# Patient Record
Sex: Female | Born: 1965 | Race: White | Hispanic: No | Smoking: Former smoker
Health system: Southern US, Community
[De-identification: ages and names within clinical notes are randomized; demographics above are authoritative.]

## PROBLEM LIST (undated history)

## (undated) DIAGNOSIS — F32A Depression, unspecified: Secondary | ICD-10-CM

## (undated) DIAGNOSIS — M14672 Charcot's joint, left ankle and foot: Secondary | ICD-10-CM

## (undated) DIAGNOSIS — E785 Hyperlipidemia, unspecified: Secondary | ICD-10-CM

## (undated) DIAGNOSIS — G709 Myoneural disorder, unspecified: Secondary | ICD-10-CM

## (undated) DIAGNOSIS — D649 Anemia, unspecified: Secondary | ICD-10-CM

## (undated) DIAGNOSIS — F329 Major depressive disorder, single episode, unspecified: Secondary | ICD-10-CM

## (undated) DIAGNOSIS — F250 Schizoaffective disorder, bipolar type: Secondary | ICD-10-CM

## (undated) DIAGNOSIS — F259 Schizoaffective disorder, unspecified: Secondary | ICD-10-CM

## (undated) DIAGNOSIS — E119 Type 2 diabetes mellitus without complications: Secondary | ICD-10-CM

## (undated) DIAGNOSIS — I10 Essential (primary) hypertension: Secondary | ICD-10-CM

## (undated) HISTORY — PX: CHOLECYSTECTOMY: SHX55

## (undated) HISTORY — DX: Hyperlipidemia, unspecified: E78.5

## (undated) HISTORY — DX: Anemia, unspecified: D64.9

## (undated) HISTORY — DX: Myoneural disorder, unspecified: G70.9

## (undated) SURGERY — OSTECTOMY
Anesthesia: IV Sedation (MBSC Only) | Laterality: Right

---

## 2008-10-01 ENCOUNTER — Emergency Department: Payer: Self-pay | Admitting: Emergency Medicine

## 2008-10-03 ENCOUNTER — Emergency Department: Payer: Self-pay | Admitting: Emergency Medicine

## 2009-07-07 ENCOUNTER — Inpatient Hospital Stay: Payer: Self-pay | Admitting: Internal Medicine

## 2011-05-09 ENCOUNTER — Emergency Department: Payer: Self-pay | Admitting: *Deleted

## 2012-06-26 ENCOUNTER — Emergency Department: Payer: Self-pay | Admitting: Emergency Medicine

## 2012-06-28 ENCOUNTER — Emergency Department: Payer: Self-pay | Admitting: Internal Medicine

## 2012-06-30 LAB — WOUND CULTURE

## 2012-08-19 ENCOUNTER — Emergency Department: Payer: Self-pay | Admitting: Emergency Medicine

## 2012-08-19 DIAGNOSIS — Z87891 Personal history of nicotine dependence: Secondary | ICD-10-CM | POA: Diagnosis not present

## 2012-08-19 DIAGNOSIS — R110 Nausea: Secondary | ICD-10-CM | POA: Diagnosis not present

## 2012-08-19 DIAGNOSIS — I498 Other specified cardiac arrhythmias: Secondary | ICD-10-CM | POA: Diagnosis not present

## 2012-08-19 DIAGNOSIS — IMO0001 Reserved for inherently not codable concepts without codable children: Secondary | ICD-10-CM | POA: Diagnosis not present

## 2012-08-19 DIAGNOSIS — E119 Type 2 diabetes mellitus without complications: Secondary | ICD-10-CM | POA: Diagnosis not present

## 2012-08-19 DIAGNOSIS — Z9089 Acquired absence of other organs: Secondary | ICD-10-CM | POA: Diagnosis not present

## 2012-08-19 DIAGNOSIS — R109 Unspecified abdominal pain: Secondary | ICD-10-CM | POA: Diagnosis not present

## 2012-08-19 LAB — CBC
HCT: 45.7 % (ref 35.0–47.0)
HGB: 15.2 g/dL (ref 12.0–16.0)
MCH: 29.5 pg (ref 26.0–34.0)
MCHC: 33.2 g/dL (ref 32.0–36.0)
MCV: 89 fL (ref 80–100)
Platelet: 203 10*3/uL (ref 150–440)
RBC: 5.14 10*6/uL (ref 3.80–5.20)
RDW: 16.4 % — ABNORMAL HIGH (ref 11.5–14.5)
WBC: 7.7 10*3/uL (ref 3.6–11.0)

## 2012-08-19 LAB — COMPREHENSIVE METABOLIC PANEL
Albumin: 3.6 g/dL (ref 3.4–5.0)
Alkaline Phosphatase: 113 U/L (ref 50–136)
Anion Gap: 11 (ref 7–16)
BUN: 15 mg/dL (ref 7–18)
Bilirubin,Total: 0.5 mg/dL (ref 0.2–1.0)
Calcium, Total: 8.9 mg/dL (ref 8.5–10.1)
Chloride: 99 mmol/L (ref 98–107)
Co2: 23 mmol/L (ref 21–32)
Creatinine: 0.86 mg/dL (ref 0.60–1.30)
EGFR (African American): >60
EGFR (Non-African Amer.): >60
Glucose: 331 mg/dL — ABNORMAL HIGH (ref 65–99)
Osmolality: 280 (ref 275–301)
Potassium: 3.1 mmol/L — ABNORMAL LOW (ref 3.5–5.1)
SGOT(AST): 30 U/L (ref 15–37)
SGPT (ALT): 45 U/L (ref 12–78)
Sodium: 133 mmol/L — ABNORMAL LOW (ref 136–145)
Total Protein: 7.8 g/dL (ref 6.4–8.2)

## 2012-08-19 LAB — URINALYSIS, COMPLETE
Bacteria: NONE SEEN
Bilirubin,UR: NEGATIVE
Glucose,UR: >=500 mg/dL (ref 0–75)
Nitrite: NEGATIVE
Ph: 5 (ref 4.5–8.0)
Protein: NEGATIVE
RBC,UR: 1 /HPF (ref 0–5)
Specific Gravity: 1.015 (ref 1.003–1.030)
Squamous Epithelial: 3
WBC UR: 1 /HPF (ref 0–5)

## 2012-08-19 LAB — TROPONIN I
Troponin-I: <0.02 ng/mL
Troponin-I: <0.02 ng/mL

## 2012-08-19 LAB — LIPASE, BLOOD: Lipase: 97 U/L (ref 73–393)

## 2012-10-10 DIAGNOSIS — F209 Schizophrenia, unspecified: Secondary | ICD-10-CM | POA: Diagnosis not present

## 2012-10-10 DIAGNOSIS — I10 Essential (primary) hypertension: Secondary | ICD-10-CM | POA: Diagnosis not present

## 2012-10-10 DIAGNOSIS — E119 Type 2 diabetes mellitus without complications: Secondary | ICD-10-CM | POA: Diagnosis not present

## 2012-10-10 DIAGNOSIS — E785 Hyperlipidemia, unspecified: Secondary | ICD-10-CM | POA: Diagnosis not present

## 2012-12-10 DIAGNOSIS — F259 Schizoaffective disorder, unspecified: Secondary | ICD-10-CM | POA: Diagnosis not present

## 2013-03-13 DIAGNOSIS — F259 Schizoaffective disorder, unspecified: Secondary | ICD-10-CM | POA: Diagnosis not present

## 2013-03-26 DIAGNOSIS — F259 Schizoaffective disorder, unspecified: Secondary | ICD-10-CM | POA: Diagnosis not present

## 2013-04-15 DIAGNOSIS — E1142 Type 2 diabetes mellitus with diabetic polyneuropathy: Secondary | ICD-10-CM | POA: Diagnosis not present

## 2013-04-15 DIAGNOSIS — G609 Hereditary and idiopathic neuropathy, unspecified: Secondary | ICD-10-CM | POA: Diagnosis not present

## 2013-04-15 DIAGNOSIS — I10 Essential (primary) hypertension: Secondary | ICD-10-CM | POA: Diagnosis not present

## 2013-04-15 DIAGNOSIS — E1149 Type 2 diabetes mellitus with other diabetic neurological complication: Secondary | ICD-10-CM | POA: Diagnosis not present

## 2013-04-15 DIAGNOSIS — N760 Acute vaginitis: Secondary | ICD-10-CM | POA: Diagnosis not present

## 2013-04-16 DIAGNOSIS — E1149 Type 2 diabetes mellitus with other diabetic neurological complication: Secondary | ICD-10-CM | POA: Diagnosis not present

## 2013-04-16 DIAGNOSIS — I10 Essential (primary) hypertension: Secondary | ICD-10-CM | POA: Diagnosis not present

## 2013-04-17 ENCOUNTER — Ambulatory Visit: Payer: Self-pay | Admitting: Family Medicine

## 2013-04-17 DIAGNOSIS — Z794 Long term (current) use of insulin: Secondary | ICD-10-CM | POA: Diagnosis not present

## 2013-04-17 DIAGNOSIS — E669 Obesity, unspecified: Secondary | ICD-10-CM | POA: Diagnosis not present

## 2013-04-17 DIAGNOSIS — Z7189 Other specified counseling: Secondary | ICD-10-CM | POA: Diagnosis not present

## 2013-04-17 DIAGNOSIS — E119 Type 2 diabetes mellitus without complications: Secondary | ICD-10-CM | POA: Diagnosis not present

## 2013-05-02 ENCOUNTER — Ambulatory Visit: Payer: Self-pay | Admitting: Family Medicine

## 2013-05-16 DIAGNOSIS — E785 Hyperlipidemia, unspecified: Secondary | ICD-10-CM | POA: Diagnosis not present

## 2013-05-16 DIAGNOSIS — F209 Schizophrenia, unspecified: Secondary | ICD-10-CM | POA: Diagnosis not present

## 2013-05-16 DIAGNOSIS — I10 Essential (primary) hypertension: Secondary | ICD-10-CM | POA: Diagnosis not present

## 2013-05-16 DIAGNOSIS — E119 Type 2 diabetes mellitus without complications: Secondary | ICD-10-CM | POA: Diagnosis not present

## 2013-06-05 DIAGNOSIS — F259 Schizoaffective disorder, unspecified: Secondary | ICD-10-CM | POA: Diagnosis not present

## 2013-07-02 DIAGNOSIS — F259 Schizoaffective disorder, unspecified: Secondary | ICD-10-CM | POA: Diagnosis not present

## 2013-07-31 DIAGNOSIS — F259 Schizoaffective disorder, unspecified: Secondary | ICD-10-CM | POA: Diagnosis not present

## 2013-08-15 DIAGNOSIS — E119 Type 2 diabetes mellitus without complications: Secondary | ICD-10-CM | POA: Diagnosis not present

## 2013-08-15 DIAGNOSIS — E785 Hyperlipidemia, unspecified: Secondary | ICD-10-CM | POA: Diagnosis not present

## 2013-08-15 DIAGNOSIS — I10 Essential (primary) hypertension: Secondary | ICD-10-CM | POA: Diagnosis not present

## 2013-08-15 DIAGNOSIS — F209 Schizophrenia, unspecified: Secondary | ICD-10-CM | POA: Diagnosis not present

## 2013-08-20 DIAGNOSIS — E785 Hyperlipidemia, unspecified: Secondary | ICD-10-CM | POA: Diagnosis not present

## 2013-09-05 ENCOUNTER — Ambulatory Visit: Payer: Self-pay | Admitting: Family Medicine

## 2014-01-13 DIAGNOSIS — L02519 Cutaneous abscess of unspecified hand: Secondary | ICD-10-CM | POA: Diagnosis not present

## 2014-01-15 DIAGNOSIS — L02519 Cutaneous abscess of unspecified hand: Secondary | ICD-10-CM | POA: Diagnosis not present

## 2014-01-16 DIAGNOSIS — E119 Type 2 diabetes mellitus without complications: Secondary | ICD-10-CM | POA: Diagnosis not present

## 2014-01-16 DIAGNOSIS — L089 Local infection of the skin and subcutaneous tissue, unspecified: Secondary | ICD-10-CM | POA: Diagnosis not present

## 2014-02-13 ENCOUNTER — Emergency Department: Payer: Self-pay | Admitting: Internal Medicine

## 2014-02-13 DIAGNOSIS — Z79899 Other long term (current) drug therapy: Secondary | ICD-10-CM | POA: Diagnosis not present

## 2014-02-13 DIAGNOSIS — Z794 Long term (current) use of insulin: Secondary | ICD-10-CM | POA: Diagnosis not present

## 2014-02-13 DIAGNOSIS — Z760 Encounter for issue of repeat prescription: Secondary | ICD-10-CM | POA: Diagnosis not present

## 2014-02-13 DIAGNOSIS — I10 Essential (primary) hypertension: Secondary | ICD-10-CM | POA: Diagnosis not present

## 2014-02-13 DIAGNOSIS — E119 Type 2 diabetes mellitus without complications: Secondary | ICD-10-CM | POA: Diagnosis not present

## 2014-03-20 DIAGNOSIS — E119 Type 2 diabetes mellitus without complications: Secondary | ICD-10-CM | POA: Diagnosis not present

## 2014-03-25 DIAGNOSIS — E785 Hyperlipidemia, unspecified: Secondary | ICD-10-CM | POA: Diagnosis not present

## 2014-03-25 DIAGNOSIS — F209 Schizophrenia, unspecified: Secondary | ICD-10-CM | POA: Diagnosis not present

## 2014-03-25 DIAGNOSIS — E119 Type 2 diabetes mellitus without complications: Secondary | ICD-10-CM | POA: Diagnosis not present

## 2014-03-25 DIAGNOSIS — R600 Localized edema: Secondary | ICD-10-CM | POA: Diagnosis not present

## 2014-03-25 DIAGNOSIS — I10 Essential (primary) hypertension: Secondary | ICD-10-CM | POA: Diagnosis not present

## 2014-04-02 DIAGNOSIS — E1140 Type 2 diabetes mellitus with diabetic neuropathy, unspecified: Secondary | ICD-10-CM | POA: Diagnosis not present

## 2014-04-02 DIAGNOSIS — G629 Polyneuropathy, unspecified: Secondary | ICD-10-CM | POA: Insufficient documentation

## 2014-04-02 DIAGNOSIS — Z794 Long term (current) use of insulin: Secondary | ICD-10-CM | POA: Diagnosis not present

## 2014-04-02 DIAGNOSIS — E782 Mixed hyperlipidemia: Secondary | ICD-10-CM | POA: Diagnosis not present

## 2014-04-07 DIAGNOSIS — B351 Tinea unguium: Secondary | ICD-10-CM | POA: Diagnosis not present

## 2014-04-07 DIAGNOSIS — R600 Localized edema: Secondary | ICD-10-CM | POA: Diagnosis not present

## 2014-04-07 DIAGNOSIS — E1140 Type 2 diabetes mellitus with diabetic neuropathy, unspecified: Secondary | ICD-10-CM | POA: Diagnosis not present

## 2014-04-10 ENCOUNTER — Ambulatory Visit: Payer: Self-pay | Admitting: Family Medicine

## 2014-04-10 DIAGNOSIS — E119 Type 2 diabetes mellitus without complications: Secondary | ICD-10-CM | POA: Diagnosis not present

## 2014-04-10 DIAGNOSIS — Z7189 Other specified counseling: Secondary | ICD-10-CM | POA: Diagnosis not present

## 2014-04-15 ENCOUNTER — Emergency Department: Payer: Self-pay | Admitting: Emergency Medicine

## 2014-04-15 DIAGNOSIS — I10 Essential (primary) hypertension: Secondary | ICD-10-CM | POA: Diagnosis not present

## 2014-04-15 DIAGNOSIS — F209 Schizophrenia, unspecified: Secondary | ICD-10-CM | POA: Diagnosis not present

## 2014-04-15 DIAGNOSIS — Z760 Encounter for issue of repeat prescription: Secondary | ICD-10-CM | POA: Diagnosis not present

## 2014-04-15 DIAGNOSIS — E119 Type 2 diabetes mellitus without complications: Secondary | ICD-10-CM | POA: Diagnosis not present

## 2014-04-15 DIAGNOSIS — Z87891 Personal history of nicotine dependence: Secondary | ICD-10-CM | POA: Diagnosis not present

## 2014-04-16 DIAGNOSIS — F29 Unspecified psychosis not due to a substance or known physiological condition: Secondary | ICD-10-CM | POA: Diagnosis not present

## 2014-04-28 DIAGNOSIS — F251 Schizoaffective disorder, depressive type: Secondary | ICD-10-CM | POA: Diagnosis not present

## 2014-05-02 ENCOUNTER — Ambulatory Visit: Payer: Self-pay | Admitting: Family Medicine

## 2014-05-16 DIAGNOSIS — R0989 Other specified symptoms and signs involving the circulatory and respiratory systems: Secondary | ICD-10-CM | POA: Diagnosis not present

## 2014-05-16 DIAGNOSIS — E1165 Type 2 diabetes mellitus with hyperglycemia: Secondary | ICD-10-CM | POA: Diagnosis not present

## 2014-10-01 DIAGNOSIS — E1149 Type 2 diabetes mellitus with other diabetic neurological complication: Secondary | ICD-10-CM | POA: Diagnosis not present

## 2014-10-01 DIAGNOSIS — G629 Polyneuropathy, unspecified: Secondary | ICD-10-CM | POA: Diagnosis not present

## 2014-10-07 DIAGNOSIS — E1149 Type 2 diabetes mellitus with other diabetic neurological complication: Secondary | ICD-10-CM | POA: Diagnosis not present

## 2014-10-07 DIAGNOSIS — E11621 Type 2 diabetes mellitus with foot ulcer: Secondary | ICD-10-CM | POA: Diagnosis not present

## 2014-10-07 DIAGNOSIS — L97511 Non-pressure chronic ulcer of other part of right foot limited to breakdown of skin: Secondary | ICD-10-CM | POA: Diagnosis not present

## 2014-10-07 DIAGNOSIS — M79671 Pain in right foot: Secondary | ICD-10-CM | POA: Diagnosis not present

## 2014-10-07 DIAGNOSIS — Z794 Long term (current) use of insulin: Secondary | ICD-10-CM | POA: Diagnosis not present

## 2014-10-07 DIAGNOSIS — G629 Polyneuropathy, unspecified: Secondary | ICD-10-CM | POA: Diagnosis not present

## 2014-10-21 ENCOUNTER — Other Ambulatory Visit: Payer: Self-pay | Admitting: Family Medicine

## 2014-10-21 DIAGNOSIS — E1142 Type 2 diabetes mellitus with diabetic polyneuropathy: Secondary | ICD-10-CM

## 2014-10-21 MED ORDER — GLUCOSE BLOOD VI STRP
ORAL_STRIP | Status: DC
Start: 2014-10-21 — End: 2015-06-24

## 2014-10-23 DIAGNOSIS — L97511 Non-pressure chronic ulcer of other part of right foot limited to breakdown of skin: Secondary | ICD-10-CM | POA: Diagnosis not present

## 2014-10-23 DIAGNOSIS — E1149 Type 2 diabetes mellitus with other diabetic neurological complication: Secondary | ICD-10-CM | POA: Diagnosis not present

## 2014-11-04 ENCOUNTER — Other Ambulatory Visit: Payer: Self-pay | Admitting: Family Medicine

## 2014-11-04 DIAGNOSIS — IMO0002 Reserved for concepts with insufficient information to code with codable children: Secondary | ICD-10-CM | POA: Insufficient documentation

## 2014-11-04 DIAGNOSIS — E1165 Type 2 diabetes mellitus with hyperglycemia: Secondary | ICD-10-CM

## 2014-11-04 DIAGNOSIS — E1149 Type 2 diabetes mellitus with other diabetic neurological complication: Secondary | ICD-10-CM | POA: Insufficient documentation

## 2014-11-04 MED ORDER — ONETOUCH ULTRASOFT LANCETS MISC: Status: DC

## 2014-11-13 DIAGNOSIS — L97511 Non-pressure chronic ulcer of other part of right foot limited to breakdown of skin: Secondary | ICD-10-CM | POA: Diagnosis not present

## 2014-11-13 DIAGNOSIS — E1140 Type 2 diabetes mellitus with diabetic neuropathy, unspecified: Secondary | ICD-10-CM | POA: Diagnosis not present

## 2014-11-20 DIAGNOSIS — E1142 Type 2 diabetes mellitus with diabetic polyneuropathy: Secondary | ICD-10-CM | POA: Diagnosis not present

## 2014-11-20 DIAGNOSIS — E1165 Type 2 diabetes mellitus with hyperglycemia: Secondary | ICD-10-CM | POA: Diagnosis not present

## 2014-11-20 DIAGNOSIS — E1149 Type 2 diabetes mellitus with other diabetic neurological complication: Secondary | ICD-10-CM | POA: Diagnosis not present

## 2014-11-20 DIAGNOSIS — E11621 Type 2 diabetes mellitus with foot ulcer: Secondary | ICD-10-CM | POA: Diagnosis not present

## 2014-11-24 ENCOUNTER — Encounter: Payer: Self-pay | Admitting: Emergency Medicine

## 2014-11-24 ENCOUNTER — Emergency Department
Admission: EM | Admit: 2014-11-24 | Discharge: 2014-11-24 | Disposition: A | Discharge status: Home / Self Care | Payer: Medicare Other | Attending: Emergency Medicine | Admitting: Emergency Medicine

## 2014-11-24 DIAGNOSIS — S99921A Unspecified injury of right foot, initial encounter: Secondary | ICD-10-CM | POA: Diagnosis present

## 2014-11-24 DIAGNOSIS — L03031 Cellulitis of right toe: Secondary | ICD-10-CM | POA: Diagnosis not present

## 2014-11-24 DIAGNOSIS — Y9289 Other specified places as the place of occurrence of the external cause: Secondary | ICD-10-CM | POA: Insufficient documentation

## 2014-11-24 DIAGNOSIS — Y998 Other external cause status: Secondary | ICD-10-CM | POA: Diagnosis not present

## 2014-11-24 DIAGNOSIS — S91204A Unspecified open wound of right lesser toe(s) with damage to nail, initial encounter: Secondary | ICD-10-CM | POA: Insufficient documentation

## 2014-11-24 DIAGNOSIS — Z79899 Other long term (current) drug therapy: Secondary | ICD-10-CM | POA: Diagnosis not present

## 2014-11-24 DIAGNOSIS — S91209A Unspecified open wound of unspecified toe(s) with damage to nail, initial encounter: Secondary | ICD-10-CM

## 2014-11-24 DIAGNOSIS — S96091A Other injury of muscle and tendon of long flexor muscle of toe at ankle and foot level, right foot, initial encounter: Secondary | ICD-10-CM | POA: Diagnosis not present

## 2014-11-24 DIAGNOSIS — E119 Type 2 diabetes mellitus without complications: Secondary | ICD-10-CM | POA: Insufficient documentation

## 2014-11-24 DIAGNOSIS — Z87891 Personal history of nicotine dependence: Secondary | ICD-10-CM | POA: Diagnosis not present

## 2014-11-24 DIAGNOSIS — X58XXXA Exposure to other specified factors, initial encounter: Secondary | ICD-10-CM | POA: Insufficient documentation

## 2014-11-24 DIAGNOSIS — I10 Essential (primary) hypertension: Secondary | ICD-10-CM | POA: Diagnosis not present

## 2014-11-24 DIAGNOSIS — Y9389 Activity, other specified: Secondary | ICD-10-CM | POA: Insufficient documentation

## 2014-11-24 DIAGNOSIS — L03115 Cellulitis of right lower limb: Secondary | ICD-10-CM | POA: Diagnosis not present

## 2014-11-24 HISTORY — DX: Type 2 diabetes mellitus without complications: E11.9

## 2014-11-24 HISTORY — DX: Major depressive disorder, single episode, unspecified: F32.9

## 2014-11-24 HISTORY — DX: Schizoaffective disorder, bipolar type: F25.0

## 2014-11-24 HISTORY — DX: Schizoaffective disorder, unspecified: F25.9

## 2014-11-24 HISTORY — DX: Depression, unspecified: F32.A

## 2014-11-24 HISTORY — DX: Essential (primary) hypertension: I10

## 2014-11-24 LAB — GLUCOSE, CAPILLARY: Glucose-Capillary: 190 mg/dL — ABNORMAL HIGH (ref 65–99)

## 2014-11-24 MED ORDER — SULFAMETHOXAZOLE-TRIMETHOPRIM 800-160 MG PO TABS
2.0000 | ORAL_TABLET | Freq: Once | ORAL | Status: AC
  Administered 2014-11-24: 2 via ORAL

## 2014-11-24 MED ORDER — SULFAMETHOXAZOLE-TRIMETHOPRIM 800-160 MG PO TABS
ORAL_TABLET | ORAL | Status: AC
  Filled 2014-11-24: qty 2

## 2014-11-24 MED ORDER — SULFAMETHOXAZOLE-TRIMETHOPRIM 800-160 MG PO TABS: 2.0000 | ORAL_TABLET | Freq: Two times a day (BID) | ORAL | Status: DC

## 2014-11-24 NOTE — ED Provider Notes (Signed)
[facility] Emergency Department Provider Note  ____________________________________________  Time seen: Approximately 430 PM  I have reviewed the triage vital signs and the nursing notes.   HISTORY  Chief Complaint Nail Problem    HPI [name] is a 49 y.o. female with a history of diabetes and peripheral neuropathy presents with a right third toenail avulsion since this morning. The patient says that she did not feel it happen. She says that she's been wearing a boot to that foot for the past month and has been battling infections to the right third toe. Is currently not on any antibiotics. However, is followed by Dr. Vickki Muff of podiatry. Had a mild-to-moderate amount of bleeding which is now controlled. Says that she tried to get in with Dr. Unice Cobble today but he was at his [place] office and she was not able to obtain an appointment. Denies any pain at this time.   Past Medical History  Diagnosis Date  . Diabetes mellitus without complication   . Hypertension   . Depression   . Schizo affective schizophrenia     Patient Active Problem List   Diagnosis Date Noted  . DM (diabetes mellitus), type 2, uncontrolled 11/04/2014    Past Surgical History  Procedure Laterality Date  . Cholecystectomy      Current Outpatient Rx  Name  Route  Sig  Dispense  Refill  . glucose blood (ONE TOUCH TEST STRIPS) test strip      Test blood sugar three times daily.   100 each   12   . Lancets (ONETOUCH ULTRASOFT) lancets      Check blood glucose three times daily.   100 each   12     Allergies Review of patient's allergies indicates no known allergies.  No family history on file.  Social History History  Substance Use Topics  . Smoking status: Former Research scientist (life sciences)  . Smokeless tobacco: Not on file  . Alcohol Use: No    Review of Systems Constitutional: No fever/chills Eyes: No visual changes. ENT: No sore throat. Cardiovascular: Denies chest  pain. Respiratory: Denies shortness of breath. Gastrointestinal: No abdominal pain.  No nausea, no vomiting.  No diarrhea.  No constipation. Genitourinary: Negative for dysuria. Musculoskeletal: Negative for back pain. Skin: Negative for rash. Neurological: Negative for headaches, focal weakness or numbness.  10-point ROS otherwise negative.  ____________________________________________   PHYSICAL EXAM:  VITAL SIGNS: ED Triage Vitals  Enc Vitals Group     BP 11/24/14 1325 134/81 mmHg     Pulse Rate 11/24/14 1325 99     Resp 11/24/14 1325 20     Temp 11/24/14 1325 98.1 F (36.7 C)     Temp Source 11/24/14 1325 Oral     SpO2 11/24/14 1325 100 %     Weight 11/24/14 1325 239 lb (108.41 kg)     Height 11/24/14 1325 5\' 10"  (1.778 m)     Head Cir --      Peak Flow --      Pain Score 11/24/14 1326 0     Pain Loc --      Pain Edu? --      Excl. in [place]? --     Constitutional: Alert and oriented. Well appearing and in no acute distress. Eyes: Conjunctivae are normal. PERRL. EOMI. Head: Atraumatic. Nose: No congestion/rhinnorhea. Mouth/Throat: Mucous membranes are moist.  Oropharynx non-erythematous. Neck: No stridor.   Cardiovascular: Normal rate, regular rhythm. Grossly normal heart sounds.  Good peripheral circulation. Respiratory:  Normal respiratory effort.  No retractions. Lungs CTAB. Gastrointestinal: Soft and nontender. No distention. No abdominal bruits. No CVA tenderness. Musculoskeletal: No lower extremity tenderness nor edema.  No joint effusions. Right third toenail with 80-90% avulsion but still connected to the matrix. There is no active bleeding. However, there is dried blood under the nail. There is no tenderness to palpation. There is mild to moderate swelling of the digit with erythema. It is not a sausage digit. There is no tenderness along the flexor sheath. There is no pus visualized. Intact dorsalis pedis pulse. Full range of motion of the toe. Neurologic:   Normal speech and language. No gross focal neurologic deficits are appreciated. No gait instability. Skin:  Skin is warm, dry and intact. No rash noted. Psychiatric: Mood and affect are normal. Speech and behavior are normal.  ____________________________________________   LABS (all labs ordered are listed, but only abnormal results are displayed)  Labs Reviewed  GLUCOSE, CAPILLARY - Abnormal; Notable for the following:    Glucose-Capillary 190 (*)    All other components within normal limits   ____________________________________________  EKG   ____________________________________________  RADIOLOGY   ____________________________________________   PROCEDURES    ____________________________________________   INITIAL IMPRESSION / ASSESSMENT AND PLAN / ED COURSE  Pertinent labs & imaging results that were available during my care of the patient were reviewed by me and considered in my medical decision making (see chart for details).  Discussed with Dr. Vickki Muff and able to see the patient at 8:30 AM tomorrow. Wound cleaned and wrapped with Vaseline gauze as well as bandage. We'll discharge with Bactrim. Patient agrees with plan and willing to comply. ____________________________________________   FINAL CLINICAL IMPRESSION(S) / ED DIAGNOSES  Acute toenail avulsion with cellulitis. Initial visit.    Orbie Pyo, MD 11/24/14 [phone]

## 2014-11-24 NOTE — ED Notes (Signed)
Foot soaking in solution of warm water and hydrogen peroxide

## 2014-11-24 NOTE — ED Notes (Signed)
Pt presents with toenail pain to right foot. Pt states her toenail is hanging off and bleeding this morning, unable to get in to see her PCP. Pt wants her toenail taken off.

## 2014-11-24 NOTE — ED Notes (Signed)
vaseline gauze applied around right middle toe, new shoe fitted

## 2014-11-25 DIAGNOSIS — L03031 Cellulitis of right toe: Secondary | ICD-10-CM | POA: Diagnosis not present

## 2014-11-25 DIAGNOSIS — E1142 Type 2 diabetes mellitus with diabetic polyneuropathy: Secondary | ICD-10-CM | POA: Diagnosis not present

## 2014-12-25 DIAGNOSIS — L97511 Non-pressure chronic ulcer of other part of right foot limited to breakdown of skin: Secondary | ICD-10-CM | POA: Diagnosis not present

## 2014-12-25 DIAGNOSIS — E1149 Type 2 diabetes mellitus with other diabetic neurological complication: Secondary | ICD-10-CM | POA: Diagnosis not present

## 2015-06-09 DIAGNOSIS — L03031 Cellulitis of right toe: Secondary | ICD-10-CM | POA: Diagnosis not present

## 2015-06-09 DIAGNOSIS — E1142 Type 2 diabetes mellitus with diabetic polyneuropathy: Secondary | ICD-10-CM | POA: Diagnosis not present

## 2015-06-09 DIAGNOSIS — Z794 Long term (current) use of insulin: Secondary | ICD-10-CM | POA: Diagnosis not present

## 2015-06-10 DIAGNOSIS — F209 Schizophrenia, unspecified: Secondary | ICD-10-CM | POA: Diagnosis not present

## 2015-06-24 ENCOUNTER — Emergency Department
Admission: EM | Admit: 2015-06-24 | Discharge: 2015-06-24 | Disposition: A | Discharge status: Home / Self Care | Payer: Medicare Other | Attending: Emergency Medicine | Admitting: Emergency Medicine

## 2015-06-24 DIAGNOSIS — R000 Tachycardia, unspecified: Secondary | ICD-10-CM | POA: Diagnosis not present

## 2015-06-24 DIAGNOSIS — I10 Essential (primary) hypertension: Secondary | ICD-10-CM | POA: Diagnosis not present

## 2015-06-24 DIAGNOSIS — Z7984 Long term (current) use of oral hypoglycemic drugs: Secondary | ICD-10-CM | POA: Insufficient documentation

## 2015-06-24 DIAGNOSIS — F419 Anxiety disorder, unspecified: Secondary | ICD-10-CM | POA: Diagnosis not present

## 2015-06-24 DIAGNOSIS — E1165 Type 2 diabetes mellitus with hyperglycemia: Secondary | ICD-10-CM

## 2015-06-24 DIAGNOSIS — Z87891 Personal history of nicotine dependence: Secondary | ICD-10-CM | POA: Diagnosis not present

## 2015-06-24 DIAGNOSIS — F209 Schizophrenia, unspecified: Secondary | ICD-10-CM | POA: Insufficient documentation

## 2015-06-24 DIAGNOSIS — IMO0001 Reserved for inherently not codable concepts without codable children: Secondary | ICD-10-CM

## 2015-06-24 DIAGNOSIS — Z760 Encounter for issue of repeat prescription: Secondary | ICD-10-CM | POA: Insufficient documentation

## 2015-06-24 DIAGNOSIS — E119 Type 2 diabetes mellitus without complications: Secondary | ICD-10-CM | POA: Diagnosis not present

## 2015-06-24 DIAGNOSIS — Z794 Long term (current) use of insulin: Secondary | ICD-10-CM | POA: Diagnosis not present

## 2015-06-24 LAB — CBC
HCT: 44.4 % (ref 35.0–47.0)
Hemoglobin: 15 g/dL (ref 12.0–16.0)
MCH: 29.8 pg (ref 26.0–34.0)
MCHC: 33.8 g/dL (ref 32.0–36.0)
MCV: 88.1 fL (ref 80.0–100.0)
Platelets: 201 10*3/uL (ref 150–440)
RBC: 5.03 MIL/uL (ref 3.80–5.20)
RDW: 14.9 % — ABNORMAL HIGH (ref 11.5–14.5)
WBC: 9.6 10*3/uL (ref 3.6–11.0)

## 2015-06-24 LAB — URINALYSIS COMPLETE WITH MICROSCOPIC (ARMC ONLY)
Bacteria, UA: NONE SEEN
Bilirubin Urine: NEGATIVE
Glucose, UA: >500 mg/dL — AB
Hgb urine dipstick: NEGATIVE
Nitrite: NEGATIVE
Protein, ur: NEGATIVE mg/dL
Specific Gravity, Urine: 1.025 (ref 1.005–1.030)
pH: 5 (ref 5.0–8.0)

## 2015-06-24 LAB — GLUCOSE, CAPILLARY
Glucose-Capillary: 420 mg/dL — ABNORMAL HIGH (ref 65–99)
Glucose-Capillary: 368 mg/dL — ABNORMAL HIGH (ref 65–99)
Glucose-Capillary: 305 mg/dL — ABNORMAL HIGH (ref 65–99)
Glucose-Capillary: 304 mg/dL — ABNORMAL HIGH (ref 65–99)

## 2015-06-24 LAB — BASIC METABOLIC PANEL
Anion gap: 14 (ref 5–15)
BUN: 12 mg/dL (ref 6–20)
CO2: 21 mmol/L — ABNORMAL LOW (ref 22–32)
Calcium: 9.4 mg/dL (ref 8.9–10.3)
Chloride: 101 mmol/L (ref 101–111)
Creatinine, Ser: 0.8 mg/dL (ref 0.44–1.00)
GFR calc Af Amer: >60 mL/min (ref >60)
GFR calc non Af Amer: >60 mL/min (ref >60)
Glucose, Bld: 420 mg/dL — ABNORMAL HIGH (ref 65–99)
Potassium: 4 mmol/L (ref 3.5–5.1)
Sodium: 136 mmol/L (ref 135–145)

## 2015-06-24 LAB — TROPONIN I
Troponin I: 0.04 ng/mL — ABNORMAL HIGH (ref <0.031)
Troponin I: <0.03 ng/mL (ref <0.031)

## 2015-06-24 MED ORDER — LISINOPRIL-HYDROCHLOROTHIAZIDE 20-12.5 MG PO TABS: 1.0000 | ORAL_TABLET | Freq: Every day | ORAL | Status: DC

## 2015-06-24 MED ORDER — LISINOPRIL 20 MG PO TABS
20.0000 mg | ORAL_TABLET | Freq: Every day | ORAL | Status: DC
  Administered 2015-06-24: 20 mg via ORAL
  Filled 2015-06-24: qty 1

## 2015-06-24 MED ORDER — INSULIN ASPART 100 UNIT/ML ~~LOC~~ SOLN
4.0000 [IU] | Freq: Once | SUBCUTANEOUS | Status: AC
  Administered 2015-06-24: 4 [IU] via SUBCUTANEOUS
  Filled 2015-06-24: qty 4

## 2015-06-24 MED ORDER — SODIUM CHLORIDE 0.9 % IV BOLUS (SEPSIS)
1000.0000 mL | Freq: Once | INTRAVENOUS | Status: AC
  Administered 2015-06-24: 1000 mL via INTRAVENOUS

## 2015-06-24 MED ORDER — ARIPIPRAZOLE 10 MG PO TABS: 10.0000 mg | ORAL_TABLET | Freq: Every day | ORAL | Status: DC

## 2015-06-24 MED ORDER — PAROXETINE HCL 20 MG PO TABS
20.0000 mg | ORAL_TABLET | Freq: Every day | ORAL | Status: DC
  Administered 2015-06-24: 20 mg via ORAL
  Filled 2015-06-24: qty 1

## 2015-06-24 MED ORDER — METFORMIN HCL 500 MG PO TABS
1000.0000 mg | ORAL_TABLET | Freq: Two times a day (BID) | ORAL | Status: DC
  Administered 2015-06-24: 1000 mg via ORAL
  Filled 2015-06-24: qty 2

## 2015-06-24 MED ORDER — PAROXETINE HCL 20 MG PO TABS: 20.0000 mg | ORAL_TABLET | Freq: Every day | ORAL | Status: DC

## 2015-06-24 MED ORDER — METFORMIN HCL 1000 MG PO TABS: 1000.0000 mg | ORAL_TABLET | Freq: Two times a day (BID) | ORAL | Status: DC

## 2015-06-24 MED ORDER — HYDROCHLOROTHIAZIDE 12.5 MG PO CAPS
12.5000 mg | ORAL_CAPSULE | Freq: Every day | ORAL | Status: DC
  Administered 2015-06-24: 12.5 mg via ORAL
  Filled 2015-06-24: qty 1

## 2015-06-24 NOTE — ED Provider Notes (Signed)
[facility] Emergency Department Provider Note  ____________________________________________  Time seen: Approximately 5:40 PM  I have reviewed the triage vital signs and the nursing notes.   HISTORY  Chief Complaint Hypertension    HPI [name] is a 50 y.o. female history of schizophrenia, type 2 diabetes, and high blood pressure.  Patient reports she went to her doctor for regular visit to have a refill of her psychiatric medications. She denies any symptoms, pain, trouble breathing or other concerns. She reports she ran out of her medications including all of her blood pressure medicine, metformin, and psychiatric medicines about 2 months ago.  Her blood pressure was high at the clinic and they sent her here for further evaluation. She reports her blood pressure was as high as 220, she says she thinks it went up because she got agitated and there making her stay at the clinic a long time as they waited for her to come back down before finally sending her to the ER.     Past Medical History  Diagnosis Date  . Diabetes mellitus without complication ([place])   . Hypertension   . Depression   . Schizo affective schizophrenia [facility])     Patient Active Problem List   Diagnosis Date Noted  . DM (diabetes mellitus), type 2, uncontrolled ([place]) 11/04/2014    Past Surgical History  Procedure Laterality Date  . Cholecystectomy      Current Outpatient Rx  Name  Route  Sig  Dispense  Refill  . insulin aspart protamine- aspart (NOVOLOG MIX 70/30) (70-30) 100 UNIT/ML injection   Subcutaneous   Inject 30-35 Units into the skin 2 (two) times daily with a meal. Pt uses 35 units with breakfast and 30 units with supper.         . ARIPiprazole (ABILIFY) 10 MG tablet   Oral   Take 1 tablet (10 mg total) by mouth daily.   30 tablet   0   . lisinopril-hydrochlorothiazide (PRINZIDE,ZESTORETIC) 20-12.5 MG tablet   Oral   Take 1 tablet by mouth daily.   30 tablet   0   . metFORMIN (GLUCOPHAGE) 1000 MG tablet   Oral   Take 1 tablet (1,000 mg total) by mouth 2 (two) times daily with a meal.   60 tablet   0   . PARoxetine (PAXIL) 20 MG tablet   Oral   Take 1 tablet (20 mg total) by mouth daily.   30 tablet   0     Allergies Review of patient's allergies indicates no known allergies.  No family history on file.  Social History Social History  Substance Use Topics  . Smoking status: Former Research scientist (life sciences)  . Smokeless tobacco: None  . Alcohol Use: No    Review of Systems Constitutional: No fever/chills Eyes: No visual changes. ENT: No sore throat. Cardiovascular: Denies chest pain. Respiratory: Denies shortness of breath. Gastrointestinal: No abdominal pain.  No nausea, no vomiting.  No diarrhea.  No constipation. Genitourinary: Negative for dysuria. Musculoskeletal: Negative for back pain. Skin: Negative for rash. Neurological: Negative for headaches, focal weakness or numbness.  Patient states that she has schizophrenia, she does feel anxious but denies any suicidal ideation, thoughts of harming herself. She does occasionally hear voices but they do not tell her to do things, they do not instruct her to hurt herself and she has had this ongoing for years. Mother is also with her and states that she's been acting fine.  10-point ROS otherwise negative.  ____________________________________________   PHYSICAL EXAM:  VITAL SIGNS: ED Triage Vitals  Enc Vitals Group     BP 06/24/15 1501 199/104 mmHg     Pulse Rate 06/24/15 1501 114     Resp 06/24/15 1501 20     Temp 06/24/15 1501 97.6 F (36.4 C)     Temp Source 06/24/15 1501 Oral     SpO2 06/24/15 1501 99 %     Weight 06/24/15 1501 236 lb (107.049 kg)     Height 06/24/15 1501 5\' 10"  (1.778 m)     Head Cir --      Peak Flow --      Pain Score 06/24/15 1538 0     Pain Loc --      Pain Edu? --      Excl. in [place]? --    Constitutional: Alert and oriented. Well  appearing and in no acute distress. Eyes: Conjunctivae are normal. PERRL. EOMI. Head: Atraumatic. Nose: No congestion/rhinnorhea. Mouth/Throat: Mucous membranes are moist.  Oropharynx non-erythematous. Neck: No stridor.   Cardiovascular: Slightly tachycardic rate, regular rhythm. Grossly normal heart sounds.  Good peripheral circulation. Respiratory: Normal respiratory effort.  No retractions. Lungs CTAB. Gastrointestinal: Soft and nontender. No distention.  Musculoskeletal: No lower extremity tenderness nor edema.  No joint effusions. Neurologic:  Normal speech and language. No gross focal neurologic deficits are appreciated. No gait instability. Patient stands up and ambulates back and forth in the room without difficulty. Skin:  Skin is warm, dry and intact. No rash noted. Psychiatric: Mood and affect are slightly anxious. Speech and behavior are normal.  ____________________________________________   LABS (all labs ordered are listed, but only abnormal results are displayed)  Labs Reviewed  BASIC METABOLIC PANEL - Abnormal; Notable for the following:    CO2 21 (*)    Glucose, Bld 420 (*)    All other components within normal limits  CBC - Abnormal; Notable for the following:    RDW 14.9 (*)    All other components within normal limits  TROPONIN I - Abnormal; Notable for the following:    Troponin I 0.04 (*)    All other components within normal limits  URINALYSIS COMPLETEWITH MICROSCOPIC ([facility] ONLY) - Abnormal; Notable for the following:    Color, Urine STRAW (*)    APPearance HAZY (*)    Glucose, UA >500 (*)    Ketones, ur TRACE (*)    Leukocytes, UA 2+ (*)    Squamous Epithelial / LPF 6-30 (*)    All other components within normal limits  GLUCOSE, CAPILLARY - Abnormal; Notable for the following:    Glucose-Capillary 368 (*)    All other components within normal limits  GLUCOSE, CAPILLARY - Abnormal; Notable for the following:    Glucose-Capillary 305 (*)    All other  components within normal limits  GLUCOSE, CAPILLARY - Abnormal; Notable for the following:    Glucose-Capillary 304 (*)    All other components within normal limits  URINE CULTURE  TROPONIN I  CBG MONITORING, ED   ____________________________________________  EKG  Reviewed and interpreted by me at 1510 Ventricular rate 125 QRS 76 QTc 450 Significant artifact in some leads is noted There is minimal T-wave abnormality noted in 3 and aVF. No evidence of acute ST elevation or obvious ischemic abnormality noted. It is of note the patient denies any symptoms such as chest pain trouble breathing abdominal pain nausea or other symptoms at this time. ____________________________________________  RADIOLOGY   ____________________________________________   PROCEDURES  Procedure(s) performed: None  Critical Care performed: No  ____________________________________________   INITIAL IMPRESSION / ASSESSMENT AND PLAN / ED COURSE  Pertinent labs & imaging results that were available during my care of the patient were reviewed by me and considered in my medical decision making (see chart for details).  Patient presents for evaluation of hypertension. Seemingly asymptomatic. She was seen in trying to get medication refills today, but because of elevated blood pressure was referred to the emergency room. She is completely asymptomatic with no signs or symptoms suggest hypertensive emergency. Her troponin is just minimally elevated at 0.04, in the setting of being asymptomatic is unlikely of significance clinically. Her blood pressure is improved on its own, and her heart rate has improved. She is diabetic and her glucose is elevated but no evidence of severe acidosis or DKA.  ----------------------------------------- 7:43 PM on 06/24/2015 -----------------------------------------  Patient's hemodynamics continued to improve. She is resting comfortably and asymptomatic at this time. She will  follow up closely with her primary care physician in the next couple of days and I will give her refills of some of her medications for the next month. The patient is understanding of plan, and she'll follow up closely. Careful return precautions discussed with the patient and her mother are both agreeable.  Return precautions and treatment recommendations and follow-up discussed with the patient who is agreeable with the plan.  ____________________________________________   FINAL CLINICAL IMPRESSION(S) / ED DIAGNOSES  Final diagnoses:  Uncontrolled type 2 diabetes mellitus without complication, without long-term current use of insulin ([place])  Essential hypertension  Schizophrenia, unspecified type ([facility])      Delman Kitten, MD 06/24/15 2010

## 2015-06-24 NOTE — ED Notes (Signed)
Troponin 0.04, Dr.Quale notified

## 2015-06-24 NOTE — ED Notes (Signed)
Pt arrives to ER via POV c/o hypertension. PT at [place] and BP high, sent to ER for eval. PT hx of hypertension, stopped taking her medication X 4 months ago because she ran out. Denies CP, SOB, weakness, headache. Pt alert and oriented X4, active, cooperative, pt in NAD. RR even and unlabored, color WNL.

## 2015-06-24 NOTE — ED Notes (Signed)
Patient has been out of her psychotic and BP medication. Patient states, "I dont know why. I just stopped taking them. [facility] has been giving me the run around. This is crazy." Hx of schizophrenia. Patient states, "I hear voice but I have control of it". When asked if patient does not have the finances to afford her medication she states, "No that's not it". Patient is hyperactive during assessment. Patient states, "I need my medication refilled".

## 2015-06-26 LAB — URINE CULTURE: Special Requests: NORMAL

## 2015-06-29 ENCOUNTER — Encounter: Payer: Self-pay | Admitting: Family Medicine

## 2015-06-29 ENCOUNTER — Other Ambulatory Visit: Payer: Self-pay

## 2015-06-29 ENCOUNTER — Ambulatory Visit (INDEPENDENT_AMBULATORY_CARE_PROVIDER_SITE_OTHER): Payer: Medicare Other | Admitting: Family Medicine

## 2015-06-29 VITALS — BP 100/65 | HR 111 | Resp 16 | Ht 70.5 in | Wt 234.6 lb

## 2015-06-29 DIAGNOSIS — E1165 Type 2 diabetes mellitus with hyperglycemia: Secondary | ICD-10-CM | POA: Diagnosis not present

## 2015-06-29 DIAGNOSIS — F259 Schizoaffective disorder, unspecified: Secondary | ICD-10-CM | POA: Insufficient documentation

## 2015-06-29 DIAGNOSIS — F251 Schizoaffective disorder, depressive type: Secondary | ICD-10-CM | POA: Diagnosis not present

## 2015-06-29 DIAGNOSIS — I10 Essential (primary) hypertension: Secondary | ICD-10-CM | POA: Diagnosis not present

## 2015-06-29 DIAGNOSIS — IMO0001 Reserved for inherently not codable concepts without codable children: Secondary | ICD-10-CM

## 2015-06-29 DIAGNOSIS — Z794 Long term (current) use of insulin: Secondary | ICD-10-CM

## 2015-06-29 HISTORY — DX: Schizoaffective disorder, unspecified: F25.9

## 2015-06-29 MED ORDER — INSULIN ASPART PROT & ASPART (70-30 MIX) 100 UNIT/ML ~~LOC~~ SUSP: SUBCUTANEOUS | Status: DC

## 2015-06-29 MED ORDER — LISINOPRIL 20 MG PO TABS
20.0000 mg | ORAL_TABLET | Freq: Every day | ORAL | Status: DC
Start: 2015-06-29 — End: 2016-10-11

## 2015-06-29 MED ORDER — METFORMIN HCL 1000 MG PO TABS: 1000.0000 mg | ORAL_TABLET | Freq: Two times a day (BID) | ORAL | Status: DC

## 2015-06-29 NOTE — Patient Instructions (Signed)
Call if blood sugars start running near or below 100 or >250

## 2015-06-29 NOTE — Addendum Note (Signed)
Addended by: Devona Konig on: 06/29/2015 03:46 PM   Modules accepted: Orders

## 2015-06-29 NOTE — Progress Notes (Signed)
Name: [name]   MRN: [id number]    DOB: [birth year]   Date:06/29/2015       Progress Note  Subjective  Chief Complaint  Chief Complaint  Patient presents with  . Hypertension    Hosp fu BP very high. Weakness bad still today. Had stopped meds for months and now back on.    Hypertension Pertinent negatives include no blurred vision, chest pain, headaches, palpitations or shortness of breath.   Here for f/u of HBP and DM.  She has not keptr any reasonable f/u.  Also with severe schizo-affectivedepression and is supposed to see Psych.  Had been off all meds for several months (just didn't take it) and was in ER with BPs in 220 sys range.  BS in hospital was >400.  Restarted meds and she is taking them now as directed.  No problem-specific assessment & plan notes found for this encounter.   Past Medical History  Diagnosis Date  . Diabetes mellitus without complication ([place])   . Hypertension   . Depression   . Schizo affective schizophrenia [facility])     Past Surgical History  Procedure Laterality Date  . Cholecystectomy      History reviewed. No pertinent family history.  Social History   Social History  . Marital Status: Single    Spouse Name: N/A  . Number of Children: N/A  . Years of Education: N/A   Occupational History  . Not on file.   Social History Main Topics  . Smoking status: Former Research scientist (life sciences)  . Smokeless tobacco: Not on file  . Alcohol Use: No  . Drug Use: Not on file  . Sexual Activity: Not on file   Other Topics Concern  . Not on file   Social History Narrative     Current outpatient prescriptions:  .  ARIPiprazole (ABILIFY) 10 MG tablet, TK 1 T PO QD, Disp: , Rfl: 0 .  insulin aspart protamine- aspart (NOVOLOG MIX 70/30) (70-30) 100 UNIT/ML injection, Inject 35 units sub-q with breakfast and 30 units sub-q at supper, Disp: 10 mL, Rfl: 12 .  metFORMIN (GLUCOPHAGE) 1000 MG tablet, Take 1 tablet (1,000 mg total) by mouth 2 (two) times daily  with a meal., Disp: 60 tablet, Rfl: 12 .  PARoxetine (PAXIL) 20 MG tablet, Take 1 tablet (20 mg total) by mouth daily., Disp: 30 tablet, Rfl: 0 .  lisinopril (PRINIVIL,ZESTRIL) 20 MG tablet, Take 1 tablet (20 mg total) by mouth daily., Disp: 90 tablet, Rfl: 3  Not on File   Review of Systems  Constitutional: Negative for fever, chills, weight loss and diaphoresis.  HENT: Negative for hearing loss.   Eyes: Negative for blurred vision and double vision.  Respiratory: Negative for cough, shortness of breath and wheezing.   Cardiovascular: Negative for chest pain, palpitations and leg swelling.  Gastrointestinal: Negative for heartburn, abdominal pain and blood in stool.  Genitourinary: Negative for dysuria, urgency and frequency.  Skin: Negative for rash.  Neurological: Negative for weakness and headaches.  Psychiatric/Behavioral: Positive for depression.      Objective  Filed Vitals:   06/29/15 1411 06/29/15 1457  BP: 112/63 100/65  Pulse: 111   Resp: 16   Height: 5' 10.5" (1.791 m)   Weight: 234 lb 9.6 oz (106.414 kg)   SpO2: 99%     Physical Exam  Constitutional: She is oriented to person, place, and time and well-developed, well-nourished, and in no distress. No distress.  HENT:  Head: Normocephalic and atraumatic.  Eyes:  Conjunctivae are normal. Pupils are equal, round, and reactive to light. No scleral icterus.  Neck: Normal range of motion. Neck supple. Carotid bruit is not present. No thyromegaly present.  Cardiovascular: Normal rate, regular rhythm and normal heart sounds.  Exam reveals no gallop and no friction rub.   No murmur heard. Pulmonary/Chest: Effort normal and breath sounds normal. No respiratory distress. She has no wheezes. She has no rales.  Musculoskeletal: She exhibits no edema.  Lymphadenopathy:    She has no cervical adenopathy.  Neurological: She is alert and oriented to person, place, and time.  Psychiatric: Affect normal.  Vitals  reviewed.      Recent Results (from the past 2160 hour(s))  Basic metabolic panel     Status: Abnormal   Collection Time: 06/24/15  3:07 PM  Result Value Ref Range   Sodium 136 135 - 145 mmol/L   Potassium 4.0 3.5 - 5.1 mmol/L   Chloride 101 101 - 111 mmol/L   CO2 21 (L) 22 - 32 mmol/L   Glucose, Bld 420 (H) 65 - 99 mg/dL   BUN 12 6 - 20 mg/dL   Creatinine, Ser 0.80 0.44 - 1.00 mg/dL   Calcium 9.4 8.9 - 10.3 mg/dL   GFR calc non Af Amer >60 >60 mL/min   GFR calc Af Amer >60 >60 mL/min    Comment: (NOTE) The eGFR has been calculated using the CKD EPI equation. This calculation has not been validated in all clinical situations. eGFR's persistently <60 mL/min signify possible Chronic Kidney Disease.    Anion gap 14 5 - 15  CBC     Status: Abnormal   Collection Time: 06/24/15  3:07 PM  Result Value Ref Range   WBC 9.6 3.6 - 11.0 K/uL   RBC 5.03 3.80 - 5.20 MIL/uL   Hemoglobin 15.0 12.0 - 16.0 g/dL   HCT 44.4 35.0 - 47.0 %   MCV 88.1 80.0 - 100.0 fL   MCH 29.8 26.0 - 34.0 pg   MCHC 33.8 32.0 - 36.0 g/dL   RDW 14.9 (H) 11.5 - 14.5 %   Platelets 201 150 - 440 K/uL  Troponin I     Status: Abnormal   Collection Time: 06/24/15  3:07 PM  Result Value Ref Range   Troponin I 0.04 (H) <0.031 ng/mL    Comment: READ BACK AND VERIFIED WITH JILL COTRONE AT 6314 06/24/15 MLZ        PERSISTENTLY INCREASED TROPONIN VALUES IN THE RANGE OF 0.04-0.49 ng/mL CAN BE SEEN IN:       -UNSTABLE ANGINA       -CONGESTIVE HEART FAILURE       -MYOCARDITIS       -CHEST TRAUMA       -ARRYHTHMIAS       -LATE PRESENTING MYOCARDIAL INFARCTION       -COPD   CLINICAL FOLLOW-UP RECOMMENDED.   Urine culture     Status: None   Collection Time: 06/24/15  3:12 PM  Result Value Ref Range   Specimen Description URINE, CLEAN CATCH    Special Requests Normal    Culture MULTIPLE SPECIES PRESENT, SUGGEST RECOLLECTION    Report Status 06/26/2015 FINAL   Glucose, capillary     Status: Abnormal   Collection  Time: 06/24/15  3:13 PM  Result Value Ref Range   Glucose-Capillary 420 (H) 65 - 99 mg/dL  Urinalysis complete, with microscopic ([facility] only)     Status: Abnormal   Collection Time: 06/24/15  3:19 PM  Result Value Ref Range   Color, Urine STRAW (A) YELLOW   APPearance HAZY (A) CLEAR   Glucose, UA >500 (A) NEGATIVE mg/dL   Bilirubin Urine NEGATIVE NEGATIVE   Ketones, ur TRACE (A) NEGATIVE mg/dL   Specific Gravity, Urine 1.025 1.005 - 1.030   Hgb urine dipstick NEGATIVE NEGATIVE   pH 5.0 5.0 - 8.0   Protein, ur NEGATIVE NEGATIVE mg/dL   Nitrite NEGATIVE NEGATIVE   Leukocytes, UA 2+ (A) NEGATIVE   RBC / HPF 0-5 0 - 5 RBC/hpf   WBC, UA 6-30 0 - 5 WBC/hpf   Bacteria, UA NONE SEEN NONE SEEN   Squamous Epithelial / LPF 6-30 (A) NONE SEEN  Glucose, capillary     Status: Abnormal   Collection Time: 06/24/15  5:15 PM  Result Value Ref Range   Glucose-Capillary 368 (H) 65 - 99 mg/dL  Glucose, capillary     Status: Abnormal   Collection Time: 06/24/15  6:56 PM  Result Value Ref Range   Glucose-Capillary 305 (H) 65 - 99 mg/dL  Troponin I     Status: None   Collection Time: 06/24/15  7:23 PM  Result Value Ref Range   Troponin I <0.03 <0.031 ng/mL    Comment:        NO INDICATION OF MYOCARDIAL INJURY.   Glucose, capillary     Status: Abnormal   Collection Time: 06/24/15  8:03 PM  Result Value Ref Range   Glucose-Capillary 304 (H) 65 - 99 mg/dL     Assessment & Plan  Problem List Items Addressed This Visit      Cardiovascular and Mediastinum   HBP (high blood pressure)   Relevant Medications   lisinopril (PRINIVIL,ZESTRIL) 20 MG tablet     Endocrine   DM (diabetes mellitus), type 2, uncontrolled (HCC) - Primary   Relevant Medications   lisinopril (PRINIVIL,ZESTRIL) 20 MG tablet   metFORMIN (GLUCOPHAGE) 1000 MG tablet   insulin aspart protamine- aspart (NOVOLOG MIX 70/30) (70-30) 100 UNIT/ML injection     Other   Schizoaffective disorder (HCC)      Meds ordered this  encounter  Medications  . ARIPiprazole (ABILIFY) 10 MG tablet    Sig: TK 1 T PO QD    Refill:  0  . lisinopril (PRINIVIL,ZESTRIL) 20 MG tablet    Sig: Take 1 tablet (20 mg total) by mouth daily.    Dispense:  90 tablet    Refill:  3  . metFORMIN (GLUCOPHAGE) 1000 MG tablet    Sig: Take 1 tablet (1,000 mg total) by mouth 2 (two) times daily with a meal.    Dispense:  60 tablet    Refill:  12  . insulin aspart protamine- aspart (NOVOLOG MIX 70/30) (70-30) 100 UNIT/ML injection    Sig: Inject 35 units sub-q with breakfast and 30 units sub-q at supper    Dispense:  10 mL    Refill:  12   1. Uncontrolled type 2 diabetes mellitus without complication, with long-term current use of insulin (HCC)  - metFORMIN (GLUCOPHAGE) 1000 MG tablet; Take 1 tablet (1,000 mg total) by mouth 2 (two) times daily with a meal.  Dispense: 60 tablet; Refill: 12 - insulin aspart protamine- aspart (NOVOLOG MIX 70/30) (70-30) 100 UNIT/ML injection; Inject 35 units sub-q with breakfast and 30 units sub-q at supper  Dispense: 10 mL; Refill: 12  2. Essential hypertension  - lisinopril (PRINIVIL,ZESTRIL) 20 MG tablet; Take 1 tablet (20 mg total) by mouth daily.  Dispense: 90 tablet;  Refill: 3  3. Schizoaffective disorder, depressive type ([place]) GEt Paxil and Abilify from [facility]

## 2015-07-01 ENCOUNTER — Ambulatory Visit: Payer: Medicare Other | Admitting: Nurse Practitioner

## 2015-07-09 ENCOUNTER — Ambulatory Visit: Payer: Medicare Other | Admitting: Nurse Practitioner

## 2015-07-13 ENCOUNTER — Ambulatory Visit: Payer: Medicare Other | Admitting: Family Medicine

## 2015-07-29 DIAGNOSIS — F209 Schizophrenia, unspecified: Secondary | ICD-10-CM | POA: Diagnosis not present

## 2015-08-03 ENCOUNTER — Ambulatory Visit: Payer: Medicare Other | Admitting: Family Medicine

## 2015-08-07 ENCOUNTER — Other Ambulatory Visit: Payer: Self-pay | Admitting: Family Medicine

## 2015-08-07 DIAGNOSIS — Z1231 Encounter for screening mammogram for malignant neoplasm of breast: Secondary | ICD-10-CM

## 2015-08-17 DIAGNOSIS — F209 Schizophrenia, unspecified: Secondary | ICD-10-CM | POA: Diagnosis not present

## 2015-08-18 ENCOUNTER — Other Ambulatory Visit: Payer: Self-pay | Admitting: Family Medicine

## 2015-08-18 ENCOUNTER — Ambulatory Visit
Admission: RE | Admit: 2015-08-18 | Discharge: 2015-08-18 | Disposition: A | Discharge status: Home / Self Care | Payer: Medicare Other | Source: Ambulatory Visit | Attending: Family Medicine | Admitting: Family Medicine

## 2015-08-18 DIAGNOSIS — Z1231 Encounter for screening mammogram for malignant neoplasm of breast: Secondary | ICD-10-CM

## 2015-08-20 ENCOUNTER — Telehealth: Payer: Self-pay | Admitting: Family Medicine

## 2015-08-20 ENCOUNTER — Other Ambulatory Visit: Payer: Self-pay | Admitting: Family Medicine

## 2015-08-20 MED ORDER — ONETOUCH ULTRA SYSTEM W/DEVICE KIT: 1.0000 | PACK | Freq: Once | Status: DC

## 2015-08-20 MED ORDER — GLUCOSE BLOOD VI STRP
ORAL_STRIP | Status: DC
Start: 2015-08-20 — End: 2016-06-27

## 2015-08-20 NOTE — Telephone Encounter (Signed)
Done.[place] 

## 2015-08-20 NOTE — Telephone Encounter (Signed)
Pt needs a prescription for a new one touch ultra meter and strips sent to [place] close to [employer].  Her call back number is [phone].

## 2015-08-24 ENCOUNTER — Telehealth: Payer: Self-pay | Admitting: *Deleted

## 2015-08-24 NOTE — Telephone Encounter (Signed)
Called and left verbal rx for one touch ultra meter and strips with 11 refill at [facility]. Left call back # for confirmation.[place]

## 2015-09-03 ENCOUNTER — Ambulatory Visit (INDEPENDENT_AMBULATORY_CARE_PROVIDER_SITE_OTHER): Payer: Medicare Other | Admitting: Family Medicine

## 2015-09-03 ENCOUNTER — Ambulatory Visit
Admission: RE | Admit: 2015-09-03 | Discharge: 2015-09-03 | Disposition: A | Discharge status: Home / Self Care | Payer: Medicare Other | Source: Ambulatory Visit | Attending: Family Medicine | Admitting: Family Medicine

## 2015-09-03 ENCOUNTER — Encounter: Payer: Self-pay | Admitting: Family Medicine

## 2015-09-03 ENCOUNTER — Telehealth: Payer: Self-pay | Admitting: Family Medicine

## 2015-09-03 ENCOUNTER — Other Ambulatory Visit
Admission: RE | Admit: 2015-09-03 | Discharge: 2015-09-03 | Disposition: A | Discharge status: Home / Self Care | Payer: Medicare Other | Source: Ambulatory Visit | Attending: *Deleted | Admitting: *Deleted

## 2015-09-03 VITALS — BP 137/76 | HR 114 | Temp 98.4°F | Resp 16 | Ht 70.0 in | Wt 243.6 lb

## 2015-09-03 DIAGNOSIS — Z794 Long term (current) use of insulin: Secondary | ICD-10-CM

## 2015-09-03 DIAGNOSIS — L03031 Cellulitis of right toe: Secondary | ICD-10-CM

## 2015-09-03 DIAGNOSIS — M7989 Other specified soft tissue disorders: Secondary | ICD-10-CM | POA: Insufficient documentation

## 2015-09-03 DIAGNOSIS — M79661 Pain in right lower leg: Secondary | ICD-10-CM

## 2015-09-03 DIAGNOSIS — E1165 Type 2 diabetes mellitus with hyperglycemia: Secondary | ICD-10-CM | POA: Diagnosis not present

## 2015-09-03 DIAGNOSIS — L97509 Non-pressure chronic ulcer of other part of unspecified foot with unspecified severity: Secondary | ICD-10-CM

## 2015-09-03 DIAGNOSIS — E11621 Type 2 diabetes mellitus with foot ulcer: Secondary | ICD-10-CM | POA: Insufficient documentation

## 2015-09-03 DIAGNOSIS — IMO0001 Reserved for inherently not codable concepts without codable children: Secondary | ICD-10-CM

## 2015-09-03 DIAGNOSIS — G629 Polyneuropathy, unspecified: Secondary | ICD-10-CM | POA: Diagnosis not present

## 2015-09-03 LAB — COMPREHENSIVE METABOLIC PANEL
ALT: 23 U/L (ref 14–54)
AST: 20 U/L (ref 15–41)
Albumin: 3.9 g/dL (ref 3.5–5.0)
Alkaline Phosphatase: 92 U/L (ref 38–126)
Anion gap: 11 (ref 5–15)
BUN: 14 mg/dL (ref 6–20)
CO2: 26 mmol/L (ref 22–32)
Calcium: 9.7 mg/dL (ref 8.9–10.3)
Chloride: 97 mmol/L — ABNORMAL LOW (ref 101–111)
Creatinine, Ser: 0.78 mg/dL (ref 0.44–1.00)
GFR calc Af Amer: >60 mL/min (ref >60)
GFR calc non Af Amer: >60 mL/min (ref >60)
Glucose, Bld: 281 mg/dL — ABNORMAL HIGH (ref 65–99)
Potassium: 4.4 mmol/L (ref 3.5–5.1)
Sodium: 134 mmol/L — ABNORMAL LOW (ref 135–145)
Total Bilirubin: 0.5 mg/dL (ref 0.3–1.2)
Total Protein: 7.5 g/dL (ref 6.5–8.1)

## 2015-09-03 LAB — CBC WITH DIFFERENTIAL/PLATELET
Basophils Absolute: 0.1 10*3/uL (ref 0–0.1)
Basophils Relative: 1 %
Eosinophils Absolute: 0.1 10*3/uL (ref 0–0.7)
Eosinophils Relative: 1 %
HCT: 36.6 % (ref 35.0–47.0)
Hemoglobin: 12.2 g/dL (ref 12.0–16.0)
Lymphocytes Relative: 25 %
Lymphs Abs: 2.8 10*3/uL (ref 1.0–3.6)
MCH: 29.6 pg (ref 26.0–34.0)
MCHC: 33.4 g/dL (ref 32.0–36.0)
MCV: 88.6 fL (ref 80.0–100.0)
Monocytes Absolute: 0.6 10*3/uL (ref 0.2–0.9)
Monocytes Relative: 6 %
Neutro Abs: 7.9 10*3/uL — ABNORMAL HIGH (ref 1.4–6.5)
Neutrophils Relative %: 67 %
Platelets: 303 10*3/uL (ref 150–440)
RBC: 4.13 MIL/uL (ref 3.80–5.20)
RDW: 15.3 % — ABNORMAL HIGH (ref 11.5–14.5)
WBC: 11.5 10*3/uL — ABNORMAL HIGH (ref 3.6–11.0)

## 2015-09-03 LAB — LIPID PANEL
Cholesterol: 216 mg/dL — ABNORMAL HIGH (ref 0–200)
HDL: 55 mg/dL (ref >40)
LDL Cholesterol: 126 mg/dL — ABNORMAL HIGH (ref 0–99)
Total CHOL/HDL Ratio: 3.9 RATIO
Triglycerides: 176 mg/dL — ABNORMAL HIGH (ref <150)
VLDL: 35 mg/dL (ref 0–40)

## 2015-09-03 LAB — POCT GLYCOSYLATED HEMOGLOBIN (HGB A1C): Hemoglobin A1C: 10.1

## 2015-09-03 MED ORDER — DOXYCYCLINE HYCLATE 100 MG PO TABS: 100.0000 mg | ORAL_TABLET | Freq: Two times a day (BID) | ORAL | Status: DC

## 2015-09-03 MED ORDER — INSULIN GLARGINE 100 UNIT/ML SOLOSTAR PEN: 16.0000 [IU] | PEN_INJECTOR | Freq: Every day | SUBCUTANEOUS | Status: DC

## 2015-09-03 MED ORDER — INSULIN PEN NEEDLE 31G X 5 MM MISC: 1.0000 | Freq: Every day | Status: DC

## 2015-09-03 NOTE — Assessment & Plan Note (Signed)
A1c is 10.1%- change to Lantus for better sugar control and consider adding mealtime insulin. Start 16 units once daily and titrate up 2 units per week for AM sugar >130.  Hypoglycemia protocol reviewed. Continue metformin. Check CMET, UA micro, and lipid panel.  Recheck 2 weeks.

## 2015-09-03 NOTE — Assessment & Plan Note (Signed)
Previous 3rd toe ulceration seen by podiatry. Wound on great toe likely another ulcer. Pt referred for f/u with podiatry tomorrow AM. Start doxycycline BID x 10 days.

## 2015-09-03 NOTE — Progress Notes (Signed)
Subjective:    Patient ID: [name], female    DOB: 1966/04/20, 50 y.o.   MRN: [id number]  HPI: [name] is a 50 y.o. female presenting on 09/03/2015 for Leg Pain   HPI  Pt presents for toe swelling and infection. Is a diabetic and has neuropathy- cannot feel her feet. Noticed toe swelling and redness moving up the leg last Thursday. No fevers at home. R lower leg started swelling- foot is not swollen. IS warm and painful to touch. Area of redness on medial side of leg just above the ankle. No chest pain or shortness of breath. No history of blood clots. A1c is 10.1%. Taking novolog 70/30 mix. 35 in the AM and 30 in the PM. Checking sugars in the AM and PM. AM sugars are 180- >200. Evening sugars 280-300. Nothing above 400. Pt has not followed for diabetes regularly.   Past Medical History  Diagnosis Date  . Diabetes mellitus without complication ([place])   . Hypertension   . Depression   . Schizo affective schizophrenia [facility])     Current Outpatient Prescriptions on File Prior to Visit  Medication Sig  . ARIPiprazole (ABILIFY) 10 MG tablet TK 1 T PO QD  . Blood Glucose Monitoring Suppl (ONE TOUCH ULTRA SYSTEM KIT) w/Device KIT 1 kit by Does not apply route once.  [name] glucose blood test strip Check blood sugars twice daily.  [name] lisinopril (PRINIVIL,ZESTRIL) 20 MG tablet Take 1 tablet (20 mg total) by mouth daily.  . metFORMIN (GLUCOPHAGE) 1000 MG tablet Take 1 tablet (1,000 mg total) by mouth 2 (two) times daily with a meal.  . metFORMIN (GLUCOPHAGE) 1000 MG tablet Take 1 tablet (1,000 mg total) by mouth 2 (two) times daily with a meal.  . PARoxetine (PAXIL) 20 MG tablet Take 1 tablet (20 mg total) by mouth daily.   No current facility-administered medications on file prior to visit.    Review of Systems  Constitutional: Negative for fever and chills.  HENT: Negative.   Respiratory: Negative for cough, chest tightness and wheezing.   Cardiovascular: Positive for  leg swelling. Negative for chest pain and palpitations.  Gastrointestinal: Negative for nausea, vomiting, abdominal pain, diarrhea and constipation.  Endocrine: Negative.  Negative for cold intolerance, heat intolerance, polydipsia, polyphagia and polyuria.  Genitourinary: Negative for dysuria and difficulty urinating.  Musculoskeletal: Negative.   Skin: Positive for color change and wound.  Neurological: Negative for dizziness, light-headedness and numbness.  Psychiatric/Behavioral: Negative.    Per HPI unless specifically indicated above     Objective:    BP 137/76 mmHg  Pulse 114  Temp(Src) 98.4 F (36.9 C) (Oral)  Resp 16  Ht '5\' 10"'  (1.778 m)  Wt 243 lb 9.6 oz (110.496 kg)  BMI 34.95 kg/m2  SpO2 100%  Wt Readings from Last 3 Encounters:  09/03/15 243 lb 9.6 oz (110.496 kg)  06/29/15 234 lb 9.6 oz (106.414 kg)  06/24/15 236 lb (107.049 kg)    Physical Exam  Constitutional: She is oriented to person, place, and time. She appears well-developed and well-nourished.  HENT:  Head: Normocephalic and atraumatic.  Neck: Neck supple.  Cardiovascular: Normal rate, regular rhythm and normal heart sounds.  Exam reveals no gallop and no friction rub.   No murmur heard. Pulmonary/Chest: Effort normal and breath sounds normal. She has no wheezes. She exhibits no tenderness.  Abdominal: Soft. Normal appearance and bowel sounds are normal. She exhibits no distension and no mass. There is no tenderness.  There is no rebound and no guarding.  Musculoskeletal: Normal range of motion. She exhibits no tenderness.       Right lower leg: She exhibits edema (+2 pitting to mid  shin. ).       Left lower leg: She exhibits edema (+1 pitting).  Negative homan's sign.   Lymphadenopathy:    She has no cervical adenopathy.  Neurological: She is alert and oriented to person, place, and time. A sensory deficit (no sensation to monofilament BLE until just below the knee. ) is present.  Reflex Scores:       Patellar reflexes are 2+ on the right side and 2+ on the left side. Skin: Skin is warm and dry. Lesion noted. She is not diaphoretic. There is erythema. No pallor.  Ulcer on pad on the Great toe- R foot. Surrounding skin is white and blanchable. Non tender to touch because pt cannot feel toe.  R great toe is swollen. Erythematous. Nail is yellow/pitted and half is missing of great toe.  Area of erythema on medial side of RLE starting just above ankle. Tender to touch.    Diabetic Foot Exam - Simple   Simple Foot Form  Diabetic Foot exam was performed with the following findings:  Yes 09/03/2015  1:52 PM  Visual Inspection  See comments:  Yes  Sensation Testing  See comments:  Yes  Pulse Check  Posterior Tibialis and Dorsalis pulse intact bilaterally:  Yes  Comments  No sensation to monofilament bilateral feet plantar and dorsal surface. Yellow ridged nails.  Red, swollen erythematous R great toe- see above.       Results for orders placed or performed in visit on 09/03/15  POCT HgB A1C  Result Value Ref Range   Hemoglobin A1C 10.1       Assessment & Plan:   Problem List Items Addressed This Visit      Endocrine   DM (diabetes mellitus), type 2, uncontrolled ([place]) - Primary    A1c is 10.1%- change to Lantus for better sugar control and consider adding mealtime insulin. Start 16 units once daily and titrate up 2 units per week for AM sugar >130.  Hypoglycemia protocol reviewed. Continue metformin. Check CMET, UA micro, and lipid panel.  Recheck 2 weeks.       Relevant Medications   Insulin Glargine (LANTUS) 100 UNIT/ML Solostar Pen   Insulin Pen Needle (B-D UF III MINI PEN NEEDLES) 31G X 5 MM MISC   Other Relevant Orders   POCT HgB A1C (Completed)   Comprehensive metabolic panel   Urine Microalbumin w/creat. 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 Wound on great toe likely another ulcer. Pt referred for f/u  with podiatry tomorrow AM. Start doxycycline BID x 10 days.       Relevant Medications   Insulin Glargine (LANTUS) 100 UNIT/ML Solostar Pen     Nervous and Auditory   Polyneuropathy (HCC)    Severe. Likely contributing to pt toe wound. Consider starting gabapentin at pt f/u visit.        Other Visit Diagnoses    Cellulitis of toe of right foot        Treat with doxycyline BID x 10 days. Pt has appt with podiatry at 845 AM tomorrow for evaluation of R great toe.     Relevant Medications    doxycycline (VIBRA-TABS) 100 MG tablet    Other  Relevant Orders    CBC with Differential/Platelet    Pain and swelling of lower leg, right        Likely 2/2 cellulitis- however r/o DVT with [place]. Start doxycycline BID for 10 days. Check CBC. Recheck 2 weeks.     Relevant Orders    US Venous Img Lower Unilateral Right       Meds ordered this encounter  Medications  . Insulin Glargine (LANTUS) 100 UNIT/ML Solostar Pen    Sig: Inject 16 Units into the skin daily at 10 pm.    Dispense:  15 mL    Refill:  11    Order Specific Question:  Supervising Provider    Answer:  Arlis Porta [phone]  . doxycycline (VIBRA-TABS) 100 MG tablet    Sig: Take 1 tablet (100 mg total) by mouth 2 (two) times daily.    Dispense:  20 tablet    Refill:  0    Order Specific Question:  Supervising Provider    Answer:  Arlis Porta [phone]  . Insulin Pen Needle (B-D UF III MINI PEN NEEDLES) 31G X 5 MM MISC    Sig: 1 each by Does not apply route daily.    Dispense:  100 each    Refill:  3    Order Specific Question:  Supervising Provider    Answer:  Arlis Porta [[id number]]      Follow up plan: Return in about 2 weeks (around 09/17/2015) for diabetes. [name]

## 2015-09-03 NOTE — Assessment & Plan Note (Signed)
Severe. Likely contributing to pt toe wound. Consider starting gabapentin at pt f/u visit.

## 2015-09-03 NOTE — Telephone Encounter (Signed)
Called to give [place] results. Number is mother's- no release on file. Have left message with [place] that if patient returns that she is free to go home and to follow-up with podiatry in the morning.

## 2015-09-03 NOTE — Patient Instructions (Signed)
I have made you an appt to see Dr. Vickki Muff at 845am tomorrow morning about your toe. He is at the [facility] office:   [facility] [place], [place] [address] [phone] phone [phone] fax  We have made you an appt to get an ultrasound. [place] medical mall [place].   Start Lantus 16 units every night at bedtime. Increase to by 2 units every 7 days until your AM sugars are <130.  Continue your metformin.   Start doxycycline twice daily for the infection in your toe. Cover it and wrap it after your ultrasound.

## 2015-09-04 DIAGNOSIS — L97509 Non-pressure chronic ulcer of other part of unspecified foot with unspecified severity: Secondary | ICD-10-CM | POA: Diagnosis not present

## 2015-09-04 DIAGNOSIS — M2041 Other hammer toe(s) (acquired), right foot: Secondary | ICD-10-CM | POA: Diagnosis not present

## 2015-09-04 DIAGNOSIS — E11621 Type 2 diabetes mellitus with foot ulcer: Secondary | ICD-10-CM | POA: Diagnosis not present

## 2015-09-04 DIAGNOSIS — L97511 Non-pressure chronic ulcer of other part of right foot limited to breakdown of skin: Secondary | ICD-10-CM | POA: Diagnosis not present

## 2015-09-04 DIAGNOSIS — E1142 Type 2 diabetes mellitus with diabetic polyneuropathy: Secondary | ICD-10-CM | POA: Diagnosis not present

## 2015-09-04 DIAGNOSIS — Z794 Long term (current) use of insulin: Secondary | ICD-10-CM | POA: Diagnosis not present

## 2015-09-04 LAB — MICROALBUMIN / CREATININE URINE RATIO
Creatinine, Urine: 15.9 mg/dL
Microalb Creat Ratio: 45.9 mg/g creat — ABNORMAL HIGH (ref 0.0–30.0)
Microalb, Ur: 7.3 ug/mL — ABNORMAL HIGH

## 2015-09-08 ENCOUNTER — Encounter: Payer: Self-pay | Admitting: *Deleted

## 2015-09-08 ENCOUNTER — Other Ambulatory Visit: Payer: Self-pay | Admitting: Family Medicine

## 2015-09-08 MED ORDER — ATORVASTATIN CALCIUM 20 MG PO TABS: 20.0000 mg | ORAL_TABLET | Freq: Every day | ORAL | Status: DC

## 2015-09-10 DIAGNOSIS — M2041 Other hammer toe(s) (acquired), right foot: Secondary | ICD-10-CM | POA: Diagnosis not present

## 2015-09-10 DIAGNOSIS — L97511 Non-pressure chronic ulcer of other part of right foot limited to breakdown of skin: Secondary | ICD-10-CM | POA: Diagnosis not present

## 2015-09-10 DIAGNOSIS — E1142 Type 2 diabetes mellitus with diabetic polyneuropathy: Secondary | ICD-10-CM | POA: Diagnosis not present

## 2015-09-10 DIAGNOSIS — Z794 Long term (current) use of insulin: Secondary | ICD-10-CM | POA: Diagnosis not present

## 2015-09-11 DIAGNOSIS — M2041 Other hammer toe(s) (acquired), right foot: Secondary | ICD-10-CM | POA: Diagnosis not present

## 2015-09-17 ENCOUNTER — Encounter: Payer: Self-pay | Admitting: Family Medicine

## 2015-09-17 ENCOUNTER — Ambulatory Visit (INDEPENDENT_AMBULATORY_CARE_PROVIDER_SITE_OTHER): Payer: Medicare Other | Admitting: Family Medicine

## 2015-09-17 VITALS — BP 121/79 | HR 116 | Temp 98.9°F | Resp 16 | Ht 70.5 in | Wt 241.0 lb

## 2015-09-17 DIAGNOSIS — L97509 Non-pressure chronic ulcer of other part of unspecified foot with unspecified severity: Secondary | ICD-10-CM

## 2015-09-17 DIAGNOSIS — E1165 Type 2 diabetes mellitus with hyperglycemia: Secondary | ICD-10-CM

## 2015-09-17 DIAGNOSIS — G629 Polyneuropathy, unspecified: Secondary | ICD-10-CM | POA: Diagnosis not present

## 2015-09-17 DIAGNOSIS — E1142 Type 2 diabetes mellitus with diabetic polyneuropathy: Secondary | ICD-10-CM | POA: Diagnosis not present

## 2015-09-17 DIAGNOSIS — Z794 Long term (current) use of insulin: Secondary | ICD-10-CM | POA: Diagnosis not present

## 2015-09-17 DIAGNOSIS — E11621 Type 2 diabetes mellitus with foot ulcer: Secondary | ICD-10-CM | POA: Diagnosis not present

## 2015-09-17 DIAGNOSIS — L03012 Cellulitis of left finger: Secondary | ICD-10-CM | POA: Diagnosis not present

## 2015-09-17 DIAGNOSIS — IMO0002 Reserved for concepts with insufficient information to code with codable children: Secondary | ICD-10-CM

## 2015-09-17 MED ORDER — GABAPENTIN 300 MG PO CAPS: ORAL_CAPSULE | ORAL | Status: DC

## 2015-09-17 NOTE — Patient Instructions (Signed)
Let's start gabapentin to help you feel your feet: Week 1: take 1 pill at bedtime. Week 2: Take 1 pill AM and 1 bedtime Week 3: Take 1 pill AM, 1 PM, and 1 bedtime.   Insulin: Start taking 18 units per day. IN 1 week start taking 20 units per day. The week after that take 22 units per day. Then the week after take 24 units per day and stop.  Check sugars twice daily. Your goal for AM sugar is 130.  PM sugar should be 180. Please check your blood glucose 2 times daily. If your glucose is < 70 mg/dl or you have symptoms of hypoglycemia confusion, dizziness, headache, hunger, jitteriness and sweating please drink 4 oz of juice or soda.  Check blood glucose 15 minutes later. If it has not risen to >100, please seek medical attention. If > 100 please eat a snack containing protein such as peanut butter and crackers.

## 2015-09-17 NOTE — Assessment & Plan Note (Signed)
Followed by podiatry. Complete doxycycline. Pt encouraged to keep wound wrapped as recommended by podiatry. Keep appt as scheduled.

## 2015-09-17 NOTE — Assessment & Plan Note (Signed)
Start gabapentin to help with foot numbness. Start 300mg  TID. Recheck 1 mos.

## 2015-09-17 NOTE — Progress Notes (Signed)
Subjective:    Patient ID: [name], female    DOB: 1965/07/21, 50 y.o.   MRN: [id number]  HPI: [name] is a 50 y.o. female presenting on 09/17/2015 for Diabetes   HPI  Pt presents for diabetes follow-up and follow-up of toe ulcer. Ulcer is being treated by podiatry- had debriedment and put in special shoe on 5/11. Started Lantus for diabetes- avg 200 in the AM. 230 in the AM yesterday. Still doing 16 units of lantus. Still doing metformin. Is still continuing the doxycycline for her toe ulcer will finish in 2 days. Checking blood sugar in the AM and at night. Has a new kit. Constant numbness and tingling in her feet for many years. This is her second toe ulcer. Sees podiatry: F/u 5/22 for toe ulcer and hammer toe. She has a paronchyia of L hand second finger. Pus and swelling started 4 days ago. Feeling pressure in the finger. No drainage. Symptoms began after biting her nails. No fever at home. Finger is tender and throbs.   Past Medical History  Diagnosis Date  . Diabetes mellitus without complication ([place])   . Hypertension   . Depression   . Schizo affective schizophrenia [facility])     Current Outpatient Prescriptions on File Prior to Visit  Medication Sig  . ARIPiprazole (ABILIFY) 10 MG tablet TK 1 T PO QD  . atorvastatin (LIPITOR) 20 MG tablet Take 1 tablet (20 mg total) by mouth daily.  . Blood Glucose Monitoring Suppl (ONE TOUCH ULTRA SYSTEM KIT) w/Device KIT 1 kit by Does not apply route once.  . doxycycline (VIBRA-TABS) 100 MG tablet Take 1 tablet (100 mg total) by mouth 2 (two) times daily.  [name] glucose blood test strip Check blood sugars twice daily.  . Insulin Glargine (LANTUS) 100 UNIT/ML Solostar Pen Inject 16 Units into the skin daily at 10 pm.  . Insulin Pen Needle (B-D UF III MINI PEN NEEDLES) 31G X 5 MM MISC 1 each by Does not apply route daily.  [name] lisinopril (PRINIVIL,ZESTRIL) 20 MG tablet Take 1 tablet (20 mg total) by mouth daily.  . metFORMIN  (GLUCOPHAGE) 1000 MG tablet Take 1 tablet (1,000 mg total) by mouth 2 (two) times daily with a meal.  . metFORMIN (GLUCOPHAGE) 1000 MG tablet Take 1 tablet (1,000 mg total) by mouth 2 (two) times daily with a meal.  . PARoxetine (PAXIL) 20 MG tablet Take 1 tablet (20 mg total) by mouth daily.   No current facility-administered medications on file prior to visit.    Review of Systems  Constitutional: Negative for fever and chills.  HENT: Negative.   Respiratory: Negative for cough, chest tightness and wheezing.   Cardiovascular: Negative for chest pain and leg swelling.  Gastrointestinal: Negative for nausea, vomiting, abdominal pain, diarrhea and constipation.  Endocrine: Negative.  Negative for cold intolerance, heat intolerance, polydipsia, polyphagia and polyuria.  Genitourinary: Negative for dysuria and difficulty urinating.  Musculoskeletal: Negative.   Skin: Positive for color change and wound.  Neurological: Negative for dizziness, light-headedness and numbness.  Psychiatric/Behavioral: Negative.    Per HPI unless specifically indicated above     Objective:    BP 121/79 mmHg  Pulse 116  Temp(Src) 98.9 F (37.2 C) (Oral)  Resp 16  Ht 5' 10.5" (1.791 m)  Wt 241 lb (109.317 kg)  BMI 34.08 kg/m2  Wt Readings from Last 3 Encounters:  09/17/15 241 lb (109.317 kg)  09/03/15 243 lb 9.6 oz (110.496 kg)  06/29/15 234  lb 9.6 oz (106.414 kg)    Physical Exam  Constitutional: She is oriented to person, place, and time. She appears well-developed and well-nourished. No distress.  HENT:  Head: Normocephalic and atraumatic.  Neck: Neck supple.  Cardiovascular: Normal rate, regular rhythm and normal heart sounds.  Exam reveals no gallop and no friction rub.   No murmur heard. Pulmonary/Chest: Effort normal and breath sounds normal. She has no wheezes. She exhibits no tenderness.  Abdominal: Soft. Normal appearance and bowel sounds are normal. She exhibits no distension and no  mass. There is no tenderness. There is no rebound and no guarding.  Musculoskeletal: Normal range of motion. She exhibits no edema or tenderness.  Lymphadenopathy:    She has no cervical adenopathy.  Neurological: She is alert and oriented to person, place, and time.  Skin: Skin is warm and dry. She is not diaphoretic. There is erythema. No pallor.      Results for orders placed or performed during the hospital encounter of 09/03/15  Comprehensive metabolic panel  Result Value Ref Range   Sodium 134 (L) 135 - 145 mmol/L   Potassium 4.4 3.5 - 5.1 mmol/L   Chloride 97 (L) 101 - 111 mmol/L   CO2 26 22 - 32 mmol/L   Glucose, Bld 281 (H) 65 - 99 mg/dL   BUN 14 6 - 20 mg/dL   Creatinine, Ser 0.78 0.44 - 1.00 mg/dL   Calcium 9.7 8.9 - 10.3 mg/dL   Total Protein 7.5 6.5 - 8.1 g/dL   Albumin 3.9 3.5 - 5.0 g/dL   AST 20 15 - 41 U/L   ALT 23 14 - 54 U/L   Alkaline Phosphatase 92 38 - 126 U/L   Total Bilirubin 0.5 0.3 - 1.2 mg/dL   GFR calc non Af Amer >60 >60 mL/min   GFR calc Af Amer >60 >60 mL/min   Anion gap 11 5 - 15  CBC with Differential/Platelet  Result Value Ref Range   WBC 11.5 (H) 3.6 - 11.0 K/uL   RBC 4.13 3.80 - 5.20 MIL/uL   Hemoglobin 12.2 12.0 - 16.0 g/dL   HCT 36.6 35.0 - 47.0 %   MCV 88.6 80.0 - 100.0 fL   MCH 29.6 26.0 - 34.0 pg   MCHC 33.4 32.0 - 36.0 g/dL   RDW 15.3 (H) 11.5 - 14.5 %   Platelets 303 150 - 440 K/uL   Neutrophils Relative % 67% %   Neutro Abs 7.9 (H) 1.4 - 6.5 K/uL   Lymphocytes Relative 25% %   Lymphs Abs 2.8 1.0 - 3.6 K/uL   Monocytes Relative 6% %   Monocytes Absolute 0.6 0.2 - 0.9 K/uL   Eosinophils Relative 1% %   Eosinophils Absolute 0.1 0 - 0.7 K/uL   Basophils Relative 1% %   Basophils Absolute 0.1 0 - 0.1 K/uL  Lipid panel  Result Value Ref Range   Cholesterol 216 (H) 0 - 200 mg/dL   Triglycerides 176 (H) <150 mg/dL   HDL 55 >40 mg/dL   Total CHOL/HDL Ratio 3.9 RATIO   VLDL 35 0 - 40 mg/dL   LDL Cholesterol 126 (H) 0 - 99 mg/dL   Microalbumin / creatinine urine ratio  Result Value Ref Range   Microalb, Ur 7.3 (H) Not Estab. ug/mL   Microalb Creat Ratio 45.9 (H) 0.0 - 30.0 mg/g creat   Creatinine, Urine 15.9 Not Estab. mg/dL      Assessment & Plan:   Problem List Items Addressed This  Visit      Endocrine   DM (diabetes mellitus), type 2, 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HCC)    Increase Lantus up to 18 units. Plan to increase by 2 units each week until AM sugar is <130.  Pt will stop at 24 unit. Follow-up 1 mos.  Encouraged pt to check sugars daily. Hypoglycemia protocol reviewed.  Needs: Eye exam scheduled.        Ulcer of toe due to diabetes ([place])    Followed by podiatry. Complete doxycycline. Pt encouraged to keep wound wrapped as recommended by podiatry. Keep appt as scheduled.       Relevant Orders   CBC with Differential/Platelet     Nervous and Auditory   Polyneuropathy ([place]) - Primary    Start gabapentin to help with foot numbness. Start 363m TID. Recheck 1 mos.       Relevant Medications   gabapentin (NEURONTIN) 300 MG capsule    Other Visit Diagnoses    Paronychia of finger of left hand        Lanced- large amts pus removed. Covered with bandage. Soak in epsom salts. Continue doxy. Return if not improving or pus returns.        Meds ordered this encounter  Medications  . gabapentin (NEURONTIN) 300 MG capsule    Sig: Take 1 cap at bedtime for 1 week, increase to 1 AM and 1 bedtime for 1 week, increase to 1 AM, 1 PM, and 1 bedtime for total of 3 times day.    Dispense:  90 capsule    Refill:  3    Order Specific Question:  Supervising Provider    Answer:  HArlis Porta[[[id number]     Follow up plan: Return in about 4 weeks (around 10/15/2015) for diabetes. .[name]

## 2015-09-17 NOTE — Assessment & Plan Note (Signed)
Increase Lantus up to 18 units. Plan to increase by 2 units each week until AM sugar is <130.  Pt will stop at 24 unit. Follow-up 1 mos.  Encouraged pt to check sugars daily. Hypoglycemia protocol reviewed.  Needs: Eye exam scheduled.

## 2015-09-24 DIAGNOSIS — M2041 Other hammer toe(s) (acquired), right foot: Secondary | ICD-10-CM | POA: Diagnosis not present

## 2015-10-22 ENCOUNTER — Ambulatory Visit: Payer: Medicare Other | Admitting: Family Medicine

## 2015-10-22 ENCOUNTER — Other Ambulatory Visit: Payer: Self-pay | Admitting: Family Medicine

## 2016-02-10 DIAGNOSIS — F209 Schizophrenia, unspecified: Secondary | ICD-10-CM | POA: Diagnosis not present

## 2016-02-11 ENCOUNTER — Ambulatory Visit: Payer: Medicare Other | Admitting: Family Medicine

## 2016-02-24 ENCOUNTER — Emergency Department: Payer: Medicare Other

## 2016-02-24 ENCOUNTER — Encounter: Payer: Self-pay | Admitting: Family Medicine

## 2016-02-24 ENCOUNTER — Encounter: Payer: Self-pay | Admitting: Emergency Medicine

## 2016-02-24 ENCOUNTER — Inpatient Hospital Stay
Admission: EM | Admit: 2016-02-24 | Discharge: 2016-02-27 | DRG: 629 | Disposition: A | Discharge status: Home / Self Care | Payer: Medicare Other | Attending: Internal Medicine | Admitting: Internal Medicine

## 2016-02-24 ENCOUNTER — Ambulatory Visit (INDEPENDENT_AMBULATORY_CARE_PROVIDER_SITE_OTHER): Payer: Medicare Other | Admitting: Family Medicine

## 2016-02-24 ENCOUNTER — Inpatient Hospital Stay: Payer: Medicare Other

## 2016-02-24 VITALS — BP 116/79 | HR 122 | Temp 98.4°F | Resp 16 | Ht 70.5 in | Wt 235.0 lb

## 2016-02-24 DIAGNOSIS — E11621 Type 2 diabetes mellitus with foot ulcer: Secondary | ICD-10-CM

## 2016-02-24 DIAGNOSIS — L089 Local infection of the skin and subcutaneous tissue, unspecified: Secondary | ICD-10-CM

## 2016-02-24 DIAGNOSIS — E1142 Type 2 diabetes mellitus with diabetic polyneuropathy: Secondary | ICD-10-CM | POA: Diagnosis present

## 2016-02-24 DIAGNOSIS — Z87891 Personal history of nicotine dependence: Secondary | ICD-10-CM | POA: Diagnosis not present

## 2016-02-24 DIAGNOSIS — E860 Dehydration: Secondary | ICD-10-CM | POA: Diagnosis present

## 2016-02-24 DIAGNOSIS — L97519 Non-pressure chronic ulcer of other part of right foot with unspecified severity: Secondary | ICD-10-CM | POA: Diagnosis present

## 2016-02-24 DIAGNOSIS — Z794 Long term (current) use of insulin: Secondary | ICD-10-CM

## 2016-02-24 DIAGNOSIS — L99 Other disorders of skin and subcutaneous tissue in diseases classified elsewhere: Secondary | ICD-10-CM | POA: Diagnosis not present

## 2016-02-24 DIAGNOSIS — M7989 Other specified soft tissue disorders: Secondary | ICD-10-CM | POA: Diagnosis not present

## 2016-02-24 DIAGNOSIS — E1165 Type 2 diabetes mellitus with hyperglycemia: Secondary | ICD-10-CM

## 2016-02-24 DIAGNOSIS — E871 Hypo-osmolality and hyponatremia: Secondary | ICD-10-CM | POA: Diagnosis present

## 2016-02-24 DIAGNOSIS — E1169 Type 2 diabetes mellitus with other specified complication: Secondary | ICD-10-CM | POA: Diagnosis present

## 2016-02-24 DIAGNOSIS — I10 Essential (primary) hypertension: Secondary | ICD-10-CM | POA: Diagnosis present

## 2016-02-24 DIAGNOSIS — Z803 Family history of malignant neoplasm of breast: Secondary | ICD-10-CM

## 2016-02-24 DIAGNOSIS — L97509 Non-pressure chronic ulcer of other part of unspecified foot with unspecified severity: Secondary | ICD-10-CM

## 2016-02-24 DIAGNOSIS — R000 Tachycardia, unspecified: Secondary | ICD-10-CM

## 2016-02-24 DIAGNOSIS — IMO0001 Reserved for inherently not codable concepts without codable children: Secondary | ICD-10-CM

## 2016-02-24 DIAGNOSIS — L97512 Non-pressure chronic ulcer of other part of right foot with fat layer exposed: Secondary | ICD-10-CM

## 2016-02-24 DIAGNOSIS — Z6833 Body mass index (BMI) 33.0-33.9, adult: Secondary | ICD-10-CM

## 2016-02-24 DIAGNOSIS — R739 Hyperglycemia, unspecified: Secondary | ICD-10-CM

## 2016-02-24 DIAGNOSIS — E669 Obesity, unspecified: Secondary | ICD-10-CM | POA: Diagnosis present

## 2016-02-24 DIAGNOSIS — M86171 Other acute osteomyelitis, right ankle and foot: Secondary | ICD-10-CM | POA: Diagnosis present

## 2016-02-24 DIAGNOSIS — M2041 Other hammer toe(s) (acquired), right foot: Secondary | ICD-10-CM | POA: Diagnosis present

## 2016-02-24 DIAGNOSIS — Z79899 Other long term (current) drug therapy: Secondary | ICD-10-CM

## 2016-02-24 DIAGNOSIS — F259 Schizoaffective disorder, unspecified: Secondary | ICD-10-CM | POA: Diagnosis present

## 2016-02-24 DIAGNOSIS — T148XXA Other injury of unspecified body region, initial encounter: Secondary | ICD-10-CM

## 2016-02-24 DIAGNOSIS — M86179 Other acute osteomyelitis, unspecified ankle and foot: Secondary | ICD-10-CM

## 2016-02-24 DIAGNOSIS — IMO0002 Reserved for concepts with insufficient information to code with codable children: Secondary | ICD-10-CM

## 2016-02-24 LAB — CBC WITH DIFFERENTIAL/PLATELET
Basophils Absolute: 0.1 10*3/uL (ref 0–0.1)
Basophils Relative: 1 %
Eosinophils Absolute: 0 10*3/uL (ref 0–0.7)
Eosinophils Relative: 0 %
HCT: 42.8 % (ref 35.0–47.0)
Hemoglobin: 14.2 g/dL (ref 12.0–16.0)
Lymphocytes Relative: 29 %
Lymphs Abs: 1.8 10*3/uL (ref 1.0–3.6)
MCH: 29.9 pg (ref 26.0–34.0)
MCHC: 33.1 g/dL (ref 32.0–36.0)
MCV: 90.2 fL (ref 80.0–100.0)
Monocytes Absolute: 0.6 10*3/uL (ref 0.2–0.9)
Monocytes Relative: 9 %
Neutro Abs: 3.8 10*3/uL (ref 1.4–6.5)
Neutrophils Relative %: 61 %
Platelets: 236 10*3/uL (ref 150–440)
RBC: 4.74 MIL/uL (ref 3.80–5.20)
RDW: 16 % — ABNORMAL HIGH (ref 11.5–14.5)
WBC: 6.3 10*3/uL (ref 3.6–11.0)

## 2016-02-24 LAB — GLUCOSE, CAPILLARY
Glucose-Capillary: 421 mg/dL — ABNORMAL HIGH (ref 65–99)
Glucose-Capillary: 358 mg/dL — ABNORMAL HIGH (ref 65–99)
Glucose-Capillary: 381 mg/dL — ABNORMAL HIGH (ref 65–99)

## 2016-02-24 LAB — URINALYSIS COMPLETE WITH MICROSCOPIC (ARMC ONLY)
Bacteria, UA: NONE SEEN
Bilirubin Urine: NEGATIVE
Glucose, UA: >500 mg/dL — AB
Hgb urine dipstick: NEGATIVE
Ketones, ur: NEGATIVE mg/dL
Leukocytes, UA: NEGATIVE
Nitrite: NEGATIVE
Protein, ur: NEGATIVE mg/dL
Specific Gravity, Urine: 1.029 (ref 1.005–1.030)
pH: 5 (ref 5.0–8.0)

## 2016-02-24 LAB — C-REACTIVE PROTEIN: CRP: 5 mg/dL — ABNORMAL HIGH (ref <1.0)

## 2016-02-24 LAB — TSH: TSH: 1.471 u[IU]/mL (ref 0.350–4.500)

## 2016-02-24 LAB — COMPREHENSIVE METABOLIC PANEL
ALT: 28 U/L (ref 14–54)
AST: 31 U/L (ref 15–41)
Albumin: 3.7 g/dL (ref 3.5–5.0)
Alkaline Phosphatase: 114 U/L (ref 38–126)
Anion gap: 11 (ref 5–15)
BUN: 15 mg/dL (ref 6–20)
CO2: 21 mmol/L — ABNORMAL LOW (ref 22–32)
Calcium: 9.1 mg/dL (ref 8.9–10.3)
Chloride: 93 mmol/L — ABNORMAL LOW (ref 101–111)
Creatinine, Ser: 0.96 mg/dL (ref 0.44–1.00)
GFR calc Af Amer: >60 mL/min (ref >60)
GFR calc non Af Amer: >60 mL/min (ref >60)
Glucose, Bld: 698 mg/dL (ref 65–99)
Potassium: 4.4 mmol/L (ref 3.5–5.1)
Sodium: 125 mmol/L — ABNORMAL LOW (ref 135–145)
Total Bilirubin: 0.7 mg/dL (ref 0.3–1.2)
Total Protein: 7.5 g/dL (ref 6.5–8.1)

## 2016-02-24 LAB — POCT GLYCOSYLATED HEMOGLOBIN (HGB A1C): Hemoglobin A1C: 14

## 2016-02-24 LAB — MAGNESIUM: Magnesium: 1.8 mg/dL (ref 1.7–2.4)

## 2016-02-24 LAB — SEDIMENTATION RATE: Sed Rate: 53 mm/hr — ABNORMAL HIGH (ref 0–30)

## 2016-02-24 MED ORDER — SODIUM CHLORIDE 0.9 % IV BOLUS (SEPSIS)
1000.0000 mL | Freq: Once | INTRAVENOUS | Status: AC
  Administered 2016-02-24: 1000 mL via INTRAVENOUS

## 2016-02-24 MED ORDER — LISINOPRIL 20 MG PO TABS
20.0000 mg | ORAL_TABLET | Freq: Every day | ORAL | Status: DC
  Administered 2016-02-24 – 2016-02-27 (×4): 20 mg via ORAL
  Filled 2016-02-24 (×4): qty 1

## 2016-02-24 MED ORDER — PAROXETINE HCL 20 MG PO TABS
20.0000 mg | ORAL_TABLET | Freq: Every day | ORAL | Status: DC
  Administered 2016-02-24 – 2016-02-27 (×4): 20 mg via ORAL
  Filled 2016-02-24 (×4): qty 1

## 2016-02-24 MED ORDER — VANCOMYCIN HCL 10 G IV SOLR
2000.0000 mg | Freq: Once | INTRAVENOUS | Status: AC
  Administered 2016-02-24: 2000 mg via INTRAVENOUS
  Filled 2016-02-24: qty 2000

## 2016-02-24 MED ORDER — INSULIN GLARGINE 100 UNIT/ML ~~LOC~~ SOLN
30.0000 [IU] | Freq: Every day | SUBCUTANEOUS | Status: DC
  Administered 2016-02-24: 30 [IU] via SUBCUTANEOUS
  Filled 2016-02-24 (×2): qty 0.3

## 2016-02-24 MED ORDER — INSULIN ASPART 100 UNIT/ML ~~LOC~~ SOLN
0.0000 [IU] | Freq: Three times a day (TID) | SUBCUTANEOUS | Status: DC
  Administered 2016-02-25: 4 [IU] via SUBCUTANEOUS
  Administered 2016-02-25: 11 [IU] via SUBCUTANEOUS
  Administered 2016-02-25: 3 [IU] via SUBCUTANEOUS
  Administered 2016-02-26: 8 [IU] via SUBCUTANEOUS
  Administered 2016-02-26: 3 [IU] via SUBCUTANEOUS
  Administered 2016-02-26: 8 [IU] via SUBCUTANEOUS
  Administered 2016-02-27: 5 [IU] via SUBCUTANEOUS
  Filled 2016-02-24: qty 11
  Filled 2016-02-24: qty 3
  Filled 2016-02-24: qty 8
  Filled 2016-02-24: qty 3
  Filled 2016-02-24: qty 11
  Filled 2016-02-24: qty 5
  Filled 2016-02-24: qty 8

## 2016-02-24 MED ORDER — METFORMIN HCL 500 MG PO TABS
1000.0000 mg | ORAL_TABLET | Freq: Two times a day (BID) | ORAL | Status: DC
  Administered 2016-02-25 – 2016-02-27 (×5): 1000 mg via ORAL
  Filled 2016-02-24 (×5): qty 2

## 2016-02-24 MED ORDER — VANCOMYCIN HCL IN DEXTROSE 1-5 GM/200ML-% IV SOLN
1000.0000 mg | Freq: Three times a day (TID) | INTRAVENOUS | Status: DC
Start: 2016-02-25 — End: 2016-02-25
  Administered 2016-02-25: 1000 mg via INTRAVENOUS
  Filled 2016-02-24 (×3): qty 200

## 2016-02-24 MED ORDER — DOCUSATE SODIUM 100 MG PO CAPS
100.0000 mg | ORAL_CAPSULE | Freq: Two times a day (BID) | ORAL | Status: DC
  Administered 2016-02-24 – 2016-02-26 (×5): 100 mg via ORAL
  Filled 2016-02-24 (×6): qty 1

## 2016-02-24 MED ORDER — INSULIN ASPART 100 UNIT/ML ~~LOC~~ SOLN
0.0000 [IU] | Freq: Every day | SUBCUTANEOUS | Status: DC
  Administered 2016-02-24: 5 [IU] via SUBCUTANEOUS
  Administered 2016-02-25: 3 [IU] via SUBCUTANEOUS
  Administered 2016-02-26: 2 [IU] via SUBCUTANEOUS
  Filled 2016-02-24: qty 3
  Filled 2016-02-24: qty 5
  Filled 2016-02-24: qty 3

## 2016-02-24 MED ORDER — SENNA 8.6 MG PO TABS
1.0000 | ORAL_TABLET | Freq: Two times a day (BID) | ORAL | Status: DC
  Administered 2016-02-24 – 2016-02-26 (×5): 8.6 mg via ORAL
  Filled 2016-02-24 (×6): qty 1

## 2016-02-24 MED ORDER — GABAPENTIN 300 MG PO CAPS
300.0000 mg | ORAL_CAPSULE | Freq: Three times a day (TID) | ORAL | Status: DC
  Administered 2016-02-24 – 2016-02-27 (×8): 300 mg via ORAL
  Filled 2016-02-24 (×8): qty 1

## 2016-02-24 MED ORDER — ENOXAPARIN SODIUM 40 MG/0.4ML ~~LOC~~ SOLN
40.0000 mg | SUBCUTANEOUS | Status: DC
  Administered 2016-02-24 – 2016-02-26 (×3): 40 mg via SUBCUTANEOUS
  Filled 2016-02-24 (×3): qty 0.4

## 2016-02-24 MED ORDER — HYDROCODONE-ACETAMINOPHEN 5-325 MG PO TABS: 1.0000 | ORAL_TABLET | ORAL | Status: DC | PRN

## 2016-02-24 MED ORDER — VANCOMYCIN HCL 10 G IV SOLR: 2000.0000 mg | Freq: Once | INTRAVENOUS | Status: DC

## 2016-02-24 MED ORDER — SODIUM CHLORIDE 0.9% FLUSH
3.0000 mL | Freq: Two times a day (BID) | INTRAVENOUS | Status: DC
  Administered 2016-02-25 – 2016-02-26 (×3): 3 mL via INTRAVENOUS

## 2016-02-24 MED ORDER — SODIUM CHLORIDE 0.9 % IV SOLN
INTRAVENOUS | Status: DC
  Administered 2016-02-24 – 2016-02-27 (×5): via INTRAVENOUS

## 2016-02-24 MED ORDER — INSULIN ASPART 100 UNIT/ML ~~LOC~~ SOLN
4.0000 [IU] | Freq: Three times a day (TID) | SUBCUTANEOUS | Status: DC
  Administered 2016-02-25 – 2016-02-27 (×7): 4 [IU] via SUBCUTANEOUS
  Filled 2016-02-24 (×7): qty 4

## 2016-02-24 MED ORDER — ONDANSETRON HCL 4 MG/2ML IJ SOLN
4.0000 mg | Freq: Four times a day (QID) | INTRAMUSCULAR | Status: DC | PRN
  Administered 2016-02-26: 4 mg via INTRAVENOUS

## 2016-02-24 MED ORDER — INSULIN ASPART 100 UNIT/ML ~~LOC~~ SOLN
10.0000 [IU] | Freq: Once | SUBCUTANEOUS | Status: AC
  Administered 2016-02-24: 10 [IU] via SUBCUTANEOUS
  Filled 2016-02-24: qty 10

## 2016-02-24 MED ORDER — ARIPIPRAZOLE 10 MG PO TABS
10.0000 mg | ORAL_TABLET | Freq: Every day | ORAL | Status: DC
  Administered 2016-02-24 – 2016-02-27 (×4): 10 mg via ORAL
  Filled 2016-02-24 (×4): qty 1

## 2016-02-24 MED ORDER — PIPERACILLIN-TAZOBACTAM 3.375 G IVPB 30 MIN
3.3750 g | Freq: Once | INTRAVENOUS | Status: AC
  Administered 2016-02-24: 3.375 g via INTRAVENOUS
  Filled 2016-02-24: qty 50

## 2016-02-24 MED ORDER — ONDANSETRON HCL 4 MG PO TABS: 4.0000 mg | ORAL_TABLET | Freq: Four times a day (QID) | ORAL | Status: DC | PRN

## 2016-02-24 MED ORDER — PIPERACILLIN-TAZOBACTAM 3.375 G IVPB
3.3750 g | Freq: Three times a day (TID) | INTRAVENOUS | Status: DC
  Administered 2016-02-24 – 2016-02-26 (×6): 3.375 g via INTRAVENOUS
  Filled 2016-02-24 (×6): qty 50

## 2016-02-24 MED ORDER — ACETAMINOPHEN 500 MG PO TABS
1000.0000 mg | ORAL_TABLET | Freq: Once | ORAL | Status: AC
  Administered 2016-02-24: 1000 mg via ORAL
  Filled 2016-02-24: qty 2

## 2016-02-24 NOTE — ED Notes (Signed)
Patient transported to X-ray 

## 2016-02-24 NOTE — Progress Notes (Signed)
Pharmacy Antibiotic Note  [name] is a 50 y.o. female admitted on 02/24/2016 with Wound infection. Pharmacy has been consulted for Vancomycin and Zosyn dosing. Patient received Vancomycin 2gm IV x 1 and Zosyn 3.375 IV x1 in ED.   Plan: DW: 85kg    Ke: 0.079   T1/2: 8.7     Vd: 59  Will start the patient on Vancomycin 1g IV every 8 hours with 12 hour stack dosing. Calculated trough at Css 15. Will order trough prior to 5th dose.   Will start patient on Zosyn 3.375 IV EI every 8 hours.     Height: 5\' 10"  (177.8 cm) Weight: 235 lb (106.6 kg) IBW/kg (Calculated) : 68.5  Temp (24hrs), Avg:98.5 F (36.9 C), Min:98.2 F (36.8 C), Max:98.8 F (37.1 C)   Recent Labs Lab 02/24/16 1533  WBC 6.3  CREATININE 0.96    Estimated Creatinine Clearance: 90.6 mL/min (by C-G formula based on SCr of 0.96 mg/dL).    No Known Allergies  Antimicrobials this admission: 10/25 Zosyn >>  10/25 Vancomycin >>   Dose adjustments this admission:  Microbiology results:  BCx:  UCx:   Sputum:  MRSA PCR:   Thank you for allowing pharmacy to be a part of this patient's care.  Pernell Dupre, PharmD Clinical Pharmacist 02/24/2016 8:46 PM

## 2016-02-24 NOTE — ED Notes (Signed)
Admitting MD at bedside. Pt and pt's mother present.

## 2016-02-24 NOTE — ED Triage Notes (Signed)
Pt reports "infection" on all toes on right foot, reports hasn't been feeling good x2 days.

## 2016-02-24 NOTE — H&P (Signed)
[place] [facility] [place] NAME: [name]    MR#:  [id number]  DATE OF BIRTH:  Dec 19, 1965  DATE OF ADMISSION:  02/24/2016  PRIMARY CARE PHYSICIAN: [name], MD   REQUESTING/REFERRING PHYSICIAN:   CHIEF COMPLAINT:   Chief Complaint  Patient presents with  . Wound Infection    HISTORY OF PRESENT ILLNESS: [name]  is a 50 y.o. female with a known history of Diabetes mellitus type 2, insulin-dependent, hemoglobin A1c 14.0, depression, essential hypertension, schizoaffective disorder, who presents to the hospital with complaints of right foot ulcer, increased redness and swelling around the right second toe. According to the patient, she is been having ulceration of right second toe dorsal aspect for the past 3-4 weeks, she's been noticing redness and swelling, but  no drainage, or pain. She feels achy all over her body and her sugars have been running very high in 300s. Due to not feeling well, having feeling of fatigue, chills for the past 2 nights, she decided to come to emergency room for further evaluation and treatment. Hospitalist services were contacted for admission.   PAST MEDICAL HISTORY:   Past Medical History:  Diagnosis Date  . Depression   . Diabetes mellitus without complication ([place])   . Hypertension   . Schizo affective schizophrenia ([place])     PAST SURGICAL HISTORY: Past Surgical History:  Procedure Laterality Date  . CHOLECYSTECTOMY      SOCIAL HISTORY:  Social History  Substance Use Topics  . Smoking status: Former Research scientist (life sciences)  . Smokeless tobacco: Never Used  . Alcohol use No    FAMILY HISTORY:  Family History  Problem Relation Age of Onset  . Breast cancer Cousin     maternal cousin    DRUG ALLERGIES: No Known Allergies  Review of Systems  Constitutional: Positive for chills and malaise/fatigue. Negative for fever and weight loss.  HENT: Negative for congestion.   Eyes: Negative  for blurred vision and double vision.  Respiratory: Negative for cough, sputum production, shortness of breath and wheezing.   Cardiovascular: Negative for chest pain, palpitations, orthopnea, leg swelling and PND.  Gastrointestinal: Positive for diarrhea. Negative for abdominal pain, blood in stool, constipation, nausea and vomiting.  Genitourinary: Positive for frequency. Negative for dysuria, hematuria and urgency.  Musculoskeletal: Positive for myalgias. Negative for falls.  Neurological: Positive for tingling and sensory change. Negative for dizziness, tremors, focal weakness and headaches.  Endo/Heme/Allergies: Does not bruise/bleed easily.  Psychiatric/Behavioral: Negative for depression. The patient does not have insomnia.     MEDICATIONS AT HOME:  Prior to Admission medications   Medication Sig Start Date End Date Taking? Authorizing Provider  ARIPiprazole (ABILIFY) 10 MG tablet Take 10 mg by mouth daily.   Yes Historical Provider, MD  gabapentin (NEURONTIN) 300 MG capsule Take 1 cap at bedtime for 1 week, increase to 1 AM and 1 bedtime for 1 week, increase to 1 AM, 1 PM, and 1 bedtime for total of 3 times day. Patient taking differently: Take 300 mg by mouth at bedtime.  09/17/15  Yes Amy Overton Mam, NP  Insulin Glargine (LANTUS) 100 UNIT/ML Solostar Pen Inject 16 Units into the skin daily at 10 pm. Patient taking differently: Inject 22 Units into the skin daily.  09/03/15  Yes Amy Overton Mam, NP  lisinopril (PRINIVIL,ZESTRIL) 20 MG tablet Take 1 tablet (20 mg total) by mouth daily. 06/29/15  Yes Arlis Porta., MD  metFORMIN (GLUCOPHAGE) 1000 MG  tablet Take 1 tablet (1,000 mg total) by mouth 2 (two) times daily with a meal. 06/29/15  Yes Arlis Porta., MD  PARoxetine (PAXIL) 20 MG tablet Take 1 tablet (20 mg total) by mouth daily. 06/24/15  Yes Delman Kitten, MD  Blood Glucose Monitoring Suppl (ONE TOUCH ULTRA SYSTEM KIT) w/Device KIT 1 kit by Does not apply route once. 08/20/15    Arlis Porta., MD  glucose blood test strip Check blood sugars twice daily. 08/20/15   Arlis Porta., MD  Insulin Pen Needle (B-D UF III MINI PEN NEEDLES) 31G X 5 MM MISC 1 each by Does not apply route daily. 09/03/15   Amy Overton Mam, NP      PHYSICAL EXAMINATION:   VITAL SIGNS: Blood pressure 133/81, pulse (!) 105, temperature 98.8 F (37.1 C), temperature source Oral, resp. rate 17, height '5\' 10"'  (1.778 m), weight 106.6 kg (235 lb), SpO2 95 %.  GENERAL:  50 y.o.-year-old patient lying in the bed with no acute distress.  EYES: Pupils equal, round, reactive to light and accommodation. No scleral icterus. Extraocular muscles intact.  HEENT: Head atraumatic, normocephalic. Oropharynx and nasopharynx clear.  NECK:  Supple, no jugular venous distention. No thyroid enlargement, no tenderness.  LUNGS: Normal breath sounds bilaterally, no wheezing, rales,rhonchi or crepitation. No use of accessory muscles of respiration.  CARDIOVASCULAR: S1, S2 normal. No murmurs, rubs, or gallops.  ABDOMEN: Soft, nontender, nondistended. Bowel sounds present. No organomegaly or mass.  EXTREMITIES: Mild right pedal edema, right second toe revealed redness and swelling, dorsal aspect of the middle phalanx reveals ulceration, no drainage or pain on palpation, no fluctuations, no cyanosis, or clubbing. Onychomycosis. Peripheral pulses are good NEUROLOGIC: Cranial nerves II through XII are intact. Muscle strength 5/5 in all extremities. Sensation decreased to light touch in both lower extremities. Gait not checked.  PSYCHIATRIC: The patient is alert and oriented x 3.  SKIN: Erythema of second right toe, dorsal aspect, swelling of said toe, ulceration of middle phalanx, erythema surrounding ulceration, no other lesionS, or ulcerS.   LABORATORY PANEL:   CBC  Recent Labs Lab 02/24/16 1533  WBC 6.3  HGB 14.2  HCT 42.8  PLT 236  MCV 90.2  MCH 29.9  MCHC 33.1  RDW 16.0*  LYMPHSABS 1.8  MONOABS 0.6   EOSABS 0.0  BASOSABS 0.1   ------------------------------------------------------------------------------------------------------------------  Chemistries   Recent Labs Lab 02/24/16 1533  NA 125*  K 4.4  CL 93*  CO2 21*  GLUCOSE 698*  BUN 15  CREATININE 0.96  CALCIUM 9.1  AST 31  ALT 28  ALKPHOS 114  BILITOT 0.7   ------------------------------------------------------------------------------------------------------------------  Cardiac Enzymes No results for input(s): TROPONINI in the last 168 hours. ------------------------------------------------------------------------------------------------------------------  RADIOLOGY: Dg Foot Complete Right  Result Date: 02/24/2016 CLINICAL DATA:  Skin infection on toes for several weeks, initial encounter EXAM: RIGHT FOOT COMPLETE - 3+ VIEW COMPARISON:  None. FINDINGS: No acute fracture or dislocation is noted. No findings to suggest bony erosion or osteomyelitis are seen. Scattered soft tissue calcifications are noted. IMPRESSION: No acute abnormality seen. Electronically Signed   By: Inez Catalina M.D.   On: 02/24/2016 17:23    EKG: Orders placed or performed during the hospital encounter of 02/24/16  . ED EKG  . ED EKG  . EKG 12-Lead  . EKG 12-Lead  EKG in the emergency room reveals sinus tachycardia at rate of 111 beats per minutes. Borderline right axis deviation, probable anteroseptal infarct, old, nonspecific ST-T  changes  IMPRESSION AND PLAN:  Active Problems:   Uncontrolled diabetes mellitus ([place])   Diabetic foot ulcer ([place])   Acute osteomyelitis of foot ([place])  #1. Infected diabetic foot ulcer, questionable osteomyelitis, admit patient to medical floor, initiate broad-spectrum antibiotic therapy, get blood cultures, wound cultures if possible, podiatrist consultation. Get foot x-ray.  #2. Uncontrolled diabetes mellitus with hemoglobin A1c of 14.0, get diabetic education, discussed with patient extensively,  continue insulin Lantus and sliding scale insulin, advance it as needed #3 . Hyponatremia due to hyperglycemia and dehydration, continue IV fluids and hydrate, follow sodium level in the morning #4. Obesity, follow TSH and lipid panel  All the records are reviewed and case discussed with ED provider. Management plans discussed with the patient, family and they are in agreement.  CODE STATUS: Code Status History    This patient does not have a recorded code status. Please follow your organizational policy for patients in this situation.       TOTAL TIME TAKING CARE OF THIS PATIENT: 50 minutes.    Theodoro Grist M.D on 02/24/2016 at 7:36 PM  Between 7am to 6pm - Pager - [phone] After 6pm go to [email or web address] - password [email or web address] [facility]  Office  [phone]  CC: Primary care physician; [name], MD

## 2016-02-24 NOTE — Assessment & Plan Note (Signed)
Uncontrolled DM2, now with A1c 14% up from prior 10.2%, non adherence to regular insulin titration and did not follow-up x 5 months due to transportation - Now acutely ill with dehydration, hyperglycemia, and acute on chronic DM ulcer R toe, concern for osteo vs spreading cellulitis  Plan: 1. Considered oral antibiotics and close follow-up - but given difficulty with transportation and poor follow-up in past, mutual decision to proceed straight to ED for evaluation, labs, likely IV rehydration, antibiotics (likely need IV) and imaging to eval for potential osteomyelitis 2. Will need DM improved control, given titration instructions for Lantus in near future 3. Follow-up with Podiatry for debridement after hospital 4. Follow-up with PCP 1-2 weeks, DM follow-up

## 2016-02-24 NOTE — ED Provider Notes (Addendum)
[facility] Emergency Department Provider Note  ____________________________________________  Time seen: Approximately 4:24 PM  I have reviewed the triage vital signs and the nursing notes.   HISTORY  Chief Complaint Wound Infection    HPI [name] is a 50 y.o. female with a history of diabetes and intermittent medication compliance, schizoaffective schizophrenia, HTN, sent her by her primary care physician for right foot infection. The patient reports that for the past 3-4 weeks, she has noticed progressive swelling and erythema in all 5 digits of her right foot associated with several areas of skin breakdown and purulent discharge. She does not report a significant amount of pain but rather a "dull ache." No associated known trauma. She has had a subjective fever but did not have a thermometer. No chills, nausea or vomiting. She has not been taking her diabetes medications as prescribed.   Past Medical History:  Diagnosis Date  . Depression   . Diabetes mellitus without complication ([place])   . Hypertension   . Schizo affective schizophrenia [facility])     Patient Active Problem List   Diagnosis Date Noted  . 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 HBP (high blood pressure) 06/29/2015  . Schizoaffective disorder ([place]) 06/29/2015  . DM (diabetes mellitus), type 2, 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[place]) 15/17/6160  . Long term current use of insulin ([place]) 04/02/2014  . Polyneuropathy ([place]) 04/02/2014  . Type 2 diabetes mellitus ([place]) 04/02/2014    Past Surgical History:  Procedure Laterality Date  . CHOLECYSTECTOMY      Current Outpatient Rx  . Order #: [id number] Class: Historical Med  . Order #: [id number] Class: Normal  . Order #: [id number] Class: Normal  . Order #: [id number] Class: Normal  . Order #: [id number] Class: Print  . Order #: [id number] Class: Print  . Order #: [id number] Class: Normal  . Order #: [id number] Class: Normal   . Order #: [id number] Class: Normal    Allergies Review of patient's allergies indicates no known allergies.  Family History  Problem Relation Age of Onset  . Breast cancer Cousin     maternal cousin    Social History Social History  Substance Use Topics  . Smoking status: Former Research scientist (life sciences)  . Smokeless tobacco: Never Used  . Alcohol use No    Review of Systems Constitutional: Positive subjective fever. No chills. No lightheadedness or syncope. Eyes: No visual changes. ENT:  No congestion or rhinorrhea. Cardiovascular: Denies chest pain. Denies palpitations. Respiratory: Denies shortness of breath.  No cough. Gastrointestinal: No abdominal pain.  No nausea, no vomiting.  No diarrhea.  No constipation. Genitourinary: Negative for dysuria. Musculoskeletal: Negative for back pain. Positive swelling, erythema, and skin breakdown with purulent discharge in the 5 right toes. Skin: Negative for rash. Neurological: Negative for headaches. No focal numbness, tingling or weakness.   10-point ROS otherwise negative.  ____________________________________________   PHYSICAL EXAM:  VITAL SIGNS: ED Triage Vitals  Enc Vitals Group     BP 02/24/16 1530 (!) 160/82     Pulse Rate 02/24/16 1530 (!) 125     Resp 02/24/16 1530 16     Temp 02/24/16 1530 98.2 F (36.8 C)     Temp Source 02/24/16 1530 Oral     SpO2 02/24/16 1530 99 %     Weight 02/24/16 1531 235 lb (106.6 kg)     Height 02/24/16 1531 5\' 10"  (1.778 m)     Head Circumference --      Peak Flow --  Pain Score 02/24/16 1531 7     Pain Loc --      Pain Edu? --      Excl. in [place]? --     Constitutional: Alert and oriented. Well appearing and in no acute distress. Answers questions appropriately. Eyes: Conjunctivae are normal.  EOMI. No scleral icterus. Head: Atraumatic. Nose: No congestion/rhinnorhea. Mouth/Throat: Mucous membranes are Dry. Poor dentition. Neck: No stridor.  Supple.  No JVD. No  meningismus. Cardiovascular: Fast rate, regular rhythm. No murmurs, rubs or gallops.  Respiratory: Normal respiratory effort.  No accessory muscle use or retractions. Lungs CTAB.  No wheezes, rales or ronchi. Gastrointestinal: Obese. Soft, nontender and nondistended.  No guarding or rebound.  No peritoneal signs. Musculoskeletal: No LE edema. No ttp in the calves or palpable cords.  Negative Homan's sign. All 5 toes on the right foot have significant erythema and swelling. There is also skin breakdown on the anterior and posterior sides of the great toe, second toe, with purulent discharge. Positive malodor. Normal DP and PT pulse on the right. Full range of motion without pain of the right ankle and knee; no knee effusion. No erythematous lymphatic tracking. No crepitus in the right lower extremity. Neurologic:  A&Ox3.  Speech is clear.  Face and smile are symmetric.  EOMI.  Moves all extremities well. Skin:  Skin is warm.  See above. Psychiatric: Mood and affect are normal. Speech and behavior are normal.  Normal judgement.  ____________________________________________   LABS (all labs ordered are listed, but only abnormal results are displayed)  Labs Reviewed  CBC WITH DIFFERENTIAL/PLATELET - Abnormal; Notable for the following:       Result Value   RDW 16.0 (*)    All other components within normal limits  COMPREHENSIVE METABOLIC PANEL - Abnormal; Notable for the following:    Sodium 125 (*)    Chloride 93 (*)    CO2 21 (*)    Glucose, Bld 698 (*)    All other components within normal limits  SEDIMENTATION RATE - Abnormal; Notable for the following:    Sed Rate 53 (*)    All other components within normal limits  CULTURE, BLOOD (ROUTINE X 2)  CULTURE, BLOOD (ROUTINE X 2)  URINE CULTURE  URINALYSIS COMPLETEWITH MICROSCOPIC ([facility] ONLY)  C-REACTIVE PROTEIN   ____________________________________________  EKG  ED ECG REPORT I, Eula Listen, the attending physician,  personally viewed and interpreted this ECG.   Date: 02/24/2016  EKG Time: 1718  Rate: 111  Rhythm: normal EKG, normal sinus rhythm, unchanged from previous tracings, sinus tachycardia  Axis: normal  Intervals:none  ST&T Change: Nonspecific twave inversion V1, no STEMI  ____________________________________________  RADIOLOGY  Dg Foot Complete Right  Result Date: 02/24/2016 CLINICAL DATA:  Skin infection on toes for several weeks, initial encounter EXAM: RIGHT FOOT COMPLETE - 3+ VIEW COMPARISON:  None. FINDINGS: No acute fracture or dislocation is noted. No findings to suggest bony erosion or osteomyelitis are seen. Scattered soft tissue calcifications are noted. IMPRESSION: No acute abnormality seen. Electronically Signed   By: Inez Catalina M.D.   On: 02/24/2016 17:23    ____________________________________________   PROCEDURES  Procedure(s) performed: None  Procedures  Critical Care performed: No ____________________________________________   INITIAL IMPRESSION / ASSESSMENT AND PLAN / ED COURSE  Pertinent labs & imaging results that were available during my care of the patient were reviewed by me and considered in my medical decision making (see chart for details).  50 y.o. female with a history of  DM and medication noncompliance resenting with right foot infection and all 5 toes. At this time, the patient is neurovascularly intact. The patient has been having systemic symptoms including fever. We will plan to evaluate her for cellulitis and wound infection, but also consider osteomyelitis and get an x-ray to rule out gas in the foot. In addition, she'll get blood cultures to rule out bacteremia. We will culture her urine prior to antibiotics. At this time, the patient has a dull ache and will be treated with Tylenol. Her labs from triage show hyperglycemia with a blood sugar in the 600s but a normal anion gap without evidence of DKA. She will be treated empirically with  vancomycin and Zosyn. The patient will require admission to the hospital for further treatment and evaluation.  ----------------------------------------- 6:39 PM on 02/24/2016 -----------------------------------------  The patient has no evidence of osteomyelitis on plain films, but she does have a mildly elevated sedimentation rate. Her white blood cell count is normal and she is afebrile here. She has received antibiotics, insulin for hyperglycemia and fluid for her hyperglycemia and tachycardia.. Plan admission to the hospitalists at this time.  ____________________________________________  FINAL CLINICAL IMPRESSION(S) / ED DIAGNOSES  Final diagnoses:  Hyponatremia  Hyperglycemia  Wound infection  Sinus tachycardia    Clinical Course      NEW MEDICATIONS STARTED DURING THIS VISIT:  New Prescriptions   No medications on file      Eula Listen, MD 02/24/16 1839    Eula Listen, MD 02/24/16 1839

## 2016-02-24 NOTE — Assessment & Plan Note (Signed)
Worsening ulceration R 2nd dorsal toe, with now spread of erythema and edema extending into other toes. Concern with some systemic symptoms of chills, nausea and felt sick, with suspected dehydration, tachycardia, uncontrolled DM with A1c >14% - Peripheral pulse appears intact. Differential includes dry gangrene on tip of 2nd toe. Also concern for chronic ulcer and at risk for osteomyelitis in uncontrolled DM  Plan: 1. Considered oral antibiotics and close follow-up - but given difficulty with transportation and poor follow-up in past, mutual decision to proceed straight to ED for evaluation, labs, likely IV rehydration, antibiotics (likely need IV) and imaging to eval for potential osteomyelitis 2. Will need DM improved control, given titration instructions for Lantus in near future 3. Follow-up with Podiatry for debridement after hospital 4. Follow-up with PCP 1-2 weeks, DM follow-up

## 2016-02-24 NOTE — Patient Instructions (Signed)
Thank you for coming in to clinic today.  1. Recommend to go directly to the [facility] Emergency Department. - I am concerned about your Toe infection, especially if spreading to other toes - You will need detailed blood work to evaluate for infection including blood stream - Most likely will need imaging including x-ray or CT scan to evaluate if any infection deeper in the bone - You should need IV fluid rehydration as well, given concern for dehydration with elevated heart rate, and uncontrolled diabetes A1c >14 today. This is uncontrolled since your infection is worsening.  When you get out of the hospital, recommend to increase your Lantus insulin to 25 units daily, then can go up by 1 unit every 2 days if morning fasting blood sugar remains >150. Stop at 40 units if you reach this point, and follow-up with our office.  Follow-up with your Podiatrist foot doctor within next 1-2 weeks to evaluate your Toe Ulcer, they may need to remove some tissue and help it heal.  Please schedule a follow-up appointment with Dr. Luan Pulling in 1 to 2 weeks to follow-up Hospital / Diabetes  If you have any other questions or concerns, please feel free to call the clinic or send a message through [facility]. You may also schedule an earlier appointment if necessary.  [name], DO [place]

## 2016-02-24 NOTE — Progress Notes (Signed)
Subjective:    Patient ID: [name], female    DOB: 10-09-1965, 50 y.o.   MRN: [id number]  [name] is a 50 y.o. female presenting on 02/24/2016 for Nail Problem (x 2 weeks) and Diabetes  Patient presents for a same day appointment.  HPI  CHRONIC DM, Type 2 - Uncontrolled with neuropathy / Chronic DM Foot Ulceration Last visit 08/2015 for diabetes and had DM toe ulceration at that time, she has been on insulin long term, with dosage increase at that time, she had A1c at 10.1. She was followed by Podiatry for toes. - Presents today for acute visit with worsening Toe ulcer. She was unable to follow-up due to lack of transportation for 5 months, and has not adhered regularly to lantus insulin, occasionally misses doses remained at 22u daily. A1c today >14%. She reports that her Right 2nd toe ulceration is worsening, draining pus actively, spreading redness and swelling to lateral toes, not into foot. Recently admits symptoms of chills at night without measured temp at home, nausea without vomiting. - Tries to stay hydrated, but admits polydipsia and polyuria - Chronic peripheral neuropathy without any pain or sensation in Right foot - Denies hypoglycemia, syncope, headache, abdominal pain, chest pain, dyspnea    Social History  Substance Use Topics  . Smoking status: Former Research scientist (life sciences)  . Smokeless tobacco: Never Used  . Alcohol use No    Review of Systems Per HPI unless specifically indicated above     Objective:    BP 116/79 (BP Location: Left Arm, Patient Position: Sitting, Cuff Size: Large)   Pulse (!) 122   Temp 98.4 F (36.9 C) (Oral)   Resp 16   Ht 5' 10.5" (1.791 m)   Wt 235 lb (106.6 kg)   BMI 33.24 kg/m   Wt Readings from Last 3 Encounters:  02/24/16 235 lb (106.6 kg)  09/17/15 241 lb (109.3 kg)  09/03/15 243 lb 9.6 oz (110.5 kg)    Physical Exam  Constitutional: She is oriented to person, place, and time. She appears well-developed and  well-nourished. No distress.  Obese, chronically ill but currently comfortable and cooperative  HENT:  Head: Normocephalic and atraumatic.  Significantly dry mucus membranes and tongue  Eyes: Conjunctivae are normal. Pupils are equal, round, and reactive to light.  Cardiovascular: Regular rhythm and normal heart sounds.   No murmur heard. Tachycardia  Distal pulses in lower extremity +1 palpable  Pulmonary/Chest: Effort normal and breath sounds normal. No respiratory distress. She has no wheezes. She has no rales.  Musculoskeletal: She exhibits edema (Worsening on Right 2nd through 5th digits, mild non pitting edema ankles and feet). She exhibits no tenderness.  Neurological: She is alert and oriented to person, place, and time.  R foot without intact sensation due to neuropathy.  Skin: Skin is warm and dry. She is not diaphoretic. There is erythema (Worse R 2nd toe extending into 3-5th digits, some dusky appearance of foot.).  Right foot Dorsal surface R 2nd toe 1 x 1 cm open ulceration with necrotic fat tissue appearance and granulation tissue, some leakage of pus. Warm fluctuance and edema localized.  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  Psychiatric: Her behavior is normal.  Nursing note and vitals reviewed.       Results for orders placed or performed in visit on 02/24/16  POCT HgB A1C  Result Value Ref Range   Hemoglobin A1C  14.0%       Assessment & Plan:   Problem List Items Addressed This Visit    Ulcer of toe due to diabetes ([place]) - Primary    Worsening ulceration R 2nd dorsal toe, with now spread of erythema and edema extending into other toes. Concern with some systemic symptoms of chills, nausea and felt sick, with suspected dehydration, tachycardia, uncontrolled DM with A1c >14% - Peripheral pulse appears intact. Differential includes dry gangrene on tip of 2nd toe. Also concern for chronic ulcer and  at risk for osteomyelitis in uncontrolled DM  Plan: 1. Considered oral antibiotics and close follow-up - but given difficulty with transportation and poor follow-up in past, mutual decision to proceed straight to ED for evaluation, labs, likely IV rehydration, antibiotics (likely need IV) and imaging to eval for potential osteomyelitis 2. Will need DM improved control, given titration instructions for Lantus in near future 3. Follow-up with Podiatry for debridement after hospital 4. Follow-up with PCP 1-2 weeks, DM follow-up      DM (diabetes mellitus), type 2, 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HCC)    Uncontrolled DM2, now with A1c 14% up from prior 10.2%, non adherence to regular insulin titration and did not follow-up x 5 months due to transportation - Now acutely ill with dehydration, hyperglycemia, and acute on chronic DM ulcer R toe, concern for osteo vs spreading cellulitis  Plan: 1. Considered oral antibiotics and close follow-up - but given difficulty with transportation and poor follow-up in past, mutual decision to proceed straight to ED for evaluation, labs, likely IV rehydration, antibiotics (likely need IV) and imaging to eval for potential osteomyelitis 2. Will need DM improved control, given titration instructions for Lantus in near future 3. Follow-up with Podiatry for debridement after hospital 4. Follow-up with PCP 1-2 weeks, DM follow-up      Relevant Orders   POCT HgB A1C (Completed)    Other Visit Diagnoses   None.     No orders of the defined types were placed in this encounter.     Follow up plan: Return in about 1 week (around 03/02/2016), or if symptoms worsen or fail to improve, for DM toe ulcer.  [name], [facility] 02/24/2016, 2:50 PM

## 2016-02-25 LAB — BASIC METABOLIC PANEL
Anion gap: 6 (ref 5–15)
BUN: 13 mg/dL (ref 6–20)
CO2: 24 mmol/L (ref 22–32)
Calcium: 8.5 mg/dL — ABNORMAL LOW (ref 8.9–10.3)
Chloride: 106 mmol/L (ref 101–111)
Creatinine, Ser: 0.67 mg/dL (ref 0.44–1.00)
GFR calc Af Amer: >60 mL/min (ref >60)
GFR calc non Af Amer: >60 mL/min (ref >60)
Glucose, Bld: 291 mg/dL — ABNORMAL HIGH (ref 65–99)
Potassium: 4.1 mmol/L (ref 3.5–5.1)
Sodium: 136 mmol/L (ref 135–145)

## 2016-02-25 LAB — CBC
HCT: 35.5 % (ref 35.0–47.0)
Hemoglobin: 11.9 g/dL — ABNORMAL LOW (ref 12.0–16.0)
MCH: 29.9 pg (ref 26.0–34.0)
MCHC: 33.6 g/dL (ref 32.0–36.0)
MCV: 88.9 fL (ref 80.0–100.0)
Platelets: 195 10*3/uL (ref 150–440)
RBC: 3.99 MIL/uL (ref 3.80–5.20)
RDW: 16.2 % — ABNORMAL HIGH (ref 11.5–14.5)
WBC: 6 10*3/uL (ref 3.6–11.0)

## 2016-02-25 LAB — BLOOD CULTURE ID PANEL (REFLEXED)

## 2016-02-25 LAB — GLUCOSE, CAPILLARY
Glucose-Capillary: 293 mg/dL — ABNORMAL HIGH (ref 65–99)
Glucose-Capillary: 347 mg/dL — ABNORMAL HIGH (ref 65–99)
Glucose-Capillary: 270 mg/dL — ABNORMAL HIGH (ref 65–99)
Glucose-Capillary: 334 mg/dL — ABNORMAL HIGH (ref 65–99)
Glucose-Capillary: 256 mg/dL — ABNORMAL HIGH (ref 65–99)

## 2016-02-25 LAB — LIPID PANEL
Cholesterol: 184 mg/dL (ref 0–200)
HDL: 29 mg/dL — ABNORMAL LOW (ref >40)
LDL Cholesterol: 102 mg/dL — ABNORMAL HIGH (ref 0–99)
Total CHOL/HDL Ratio: 6.3 RATIO
Triglycerides: 263 mg/dL — ABNORMAL HIGH (ref <150)
VLDL: 53 mg/dL — ABNORMAL HIGH (ref 0–40)

## 2016-02-25 MED ORDER — INSULIN GLARGINE 100 UNIT/ML ~~LOC~~ SOLN
37.0000 [IU] | Freq: Every day | SUBCUTANEOUS | Status: DC
  Administered 2016-02-25 – 2016-02-26 (×2): 37 [IU] via SUBCUTANEOUS
  Filled 2016-02-25 (×3): qty 0.37

## 2016-02-25 MED ORDER — ATORVASTATIN CALCIUM 20 MG PO TABS
40.0000 mg | ORAL_TABLET | Freq: Every day | ORAL | Status: DC
  Administered 2016-02-25 – 2016-02-26 (×2): 40 mg via ORAL
  Filled 2016-02-25 (×2): qty 2

## 2016-02-25 NOTE — Progress Notes (Addendum)
Inpatient Diabetes Program Recommendations  AACE/ADA: New Consensus Statement on Inpatient Glycemic Control (2015)  Target Ranges:  Prepandial:   less than 140 mg/dL      Peak postprandial:   less than 180 mg/dL (1-2 hours)      Critically ill patients:  140 - 180 mg/dL   Lab Results  Component Value Date   GLUCAP 358 (H) 02/24/2016   GLUCAP 381 (H) 02/24/2016   HGBA1C 14.0% 02/24/2016    Review of Glycemic Control  Results for ANTONIO, WOODHAMS (MRN [id number]) as of 02/25/2016 07:18  Ref. Range 02/24/2016 19:39 02/24/2016 21:19 02/24/2016 21:19  Glucose-Capillary Latest Ref Range: 65 - 99 mg/dL 421 (H) 358 (H) 381 (H)    Diabetes history: Type 2 Outpatient Diabetes medications: Glucophage 1042m bid, Lantus 22 units qhs Current orders for Inpatient glycemic control: Glucophage 10081mbid, Novolog 0-15 units tid, Novolog 0-5 units qhs, Lantus 30 units qhs, Novolog 4 units tid  Inpatient Diabetes Program Recommendations:   Consider increasing Lantus insulin to 37 units qhs (0.35units/kg)   Met with patient at the bedside- patient admits she's been taking Lantus 22 units at hs but ran out of Metformin about 5 days ago. She tells me she saw her MD yesterday but does not remember if he told her to take more insulin.  She is frustrated because her fasting blood sugars have been running 200-30072ml.  I did discuss the possibility of sending her home on mealtime insulin and explained why the MD may chose it.  Appropriate questions asked about timing- all questions answered. Discussed recent A1C of 14.0% and the need to get blood sugars down to prevent potential complications of diabetes. Patient receptive and willing to use additional medications on discharge to improve blood sugar control.   Encouraged her to reach out to the 24 hour nurse line [facility] she has questions re: her health, or if she has questions post discharge.   JulGentry FitzN, BA, MHA, CDE Diabetes  Coordinator Inpatient Diabetes Program  [phone] Pager) [phone] Hills0/26/2017 7:20 AM

## 2016-02-25 NOTE — Progress Notes (Signed)
[place] [facility] [place] NAME: [name]    MR#:  [id number]  DATE OF BIRTH:  01-19-1966  SUBJECTIVE:  CHIEF COMPLAINT:  Patient is resting comfortably. Denies any pain. Denies any discharge  REVIEW OF SYSTEMS:  CONSTITUTIONAL: No fever, fatigue or weakness.  EYES: No blurred or double vision.  EARS, NOSE, AND THROAT: No tinnitus or ear pain.  RESPIRATORY: No cough, shortness of breath, wheezing or hemoptysis.  CARDIOVASCULAR: No chest pain, orthopnea, edema.  GASTROINTESTINAL: No nausea, vomiting, diarrhea or abdominal pain.  GENITOURINARY: No dysuria, hematuria.  ENDOCRINE: No polyuria, nocturia,  HEMATOLOGY: No anemia, easy bruising or bleeding SKIN:Right foot ulcer No rash or lesion. MUSCULOSKELETAL: No joint pain or arthritis.   NEUROLOGIC: No tingling, numbness, weakness.  PSYCHIATRY: No anxiety or depression.   DRUG ALLERGIES:  No Known Allergies  VITALS:  Blood pressure 113/64, pulse 86, temperature 98.4 F (36.9 C), temperature source Oral, resp. rate 16, height 5\' 10"  (1.778 m), weight 106.6 kg (235 lb), SpO2 97 %.  PHYSICAL EXAMINATION:  GENERAL:  50 y.o.-year-old patient lying in the bed with no acute distress.  EYES: Pupils equal, round, reactive to light and accommodation. No scleral icterus. Extraocular muscles intact.  HEENT: Head atraumatic, normocephalic. Oropharynx and nasopharynx clear.  NECK:  Supple, no jugular venous distention. No thyroid enlargement, no tenderness.  LUNGS: Normal breath sounds bilaterally, no wheezing, rales,rhonchi or crepitation. No use of accessory muscles of respiration.  CARDIOVASCULAR: S1, S2 normal. No murmurs, rubs, or gallops.  ABDOMEN: Soft, nontender, nondistended. Bowel sounds present. No organomegaly or mass.  EXTREMITIES:   right second toe revealed redness and necrotic area and swelling, dorsal aspect of the middle phalanx reveals ulceration, no drainage or pain on  palpation, no fluctuations, no cyanosis, or clubbing. Onychomycosis. Peripheral pulses are goodNo pedal edema, cyanosis, or clubbing.  NEUROLOGIC: Cranial nerves II through XII are intact. Muscle strength 5/5 in all extremities. Sensation intact. Gait not checked.  PSYCHIATRIC: The patient is alert and oriented x 3.  SKIN: No obvious rash, lesion, or ulcer.    LABORATORY PANEL:   CBC  Recent Labs Lab 02/25/16 0431  WBC 6.0  HGB 11.9*  HCT 35.5  PLT 195   ------------------------------------------------------------------------------------------------------------------  Chemistries   Recent Labs Lab 02/24/16 1533 02/25/16 0431  NA 125* 136  K 4.4 4.1  CL 93* 106  CO2 21* 24  GLUCOSE 698* 291*  BUN 15 13  CREATININE 0.96 0.67  CALCIUM 9.1 8.5*  MG 1.8  --   AST 31  --   ALT 28  --   ALKPHOS 114  --   BILITOT 0.7  --    ------------------------------------------------------------------------------------------------------------------  Cardiac Enzymes No results for input(s): TROPONINI in the last 168 hours. ------------------------------------------------------------------------------------------------------------------  RADIOLOGY:  Dg Foot Complete Right  Result Date: 02/24/2016 CLINICAL DATA:  Skin infection on toes for several weeks, initial encounter EXAM: RIGHT FOOT COMPLETE - 3+ VIEW COMPARISON:  None. FINDINGS: No acute fracture or dislocation is noted. No findings to suggest bony erosion or osteomyelitis are seen. Scattered soft tissue calcifications are noted. IMPRESSION: No acute abnormality seen. Electronically Signed   By: Inez Catalina M.D.   On: 02/24/2016 17:23    EKG:   Orders placed or performed during the hospital encounter of 02/24/16  . ED EKG  . ED EKG  . EKG 12-Lead  . EKG 12-Lead    ASSESSMENT AND PLAN:    #1. Infected diabetic foot ulcer  Podiatry consult is pending for possible surgery today  broad-spectrum antibiotic therapy  initiated at the time of admission. One out of 4 bottles of cultures with group B streptococcus. Discontinue vancomycin and continue IV Zosyn  Follow-up blood cultures, wound cultures  X-ray with no acute abnormalities  #2. Uncontrolled diabetes mellitus with hemoglobin A1c of 14.0  get diabetic education  continue insulin Lantus and sliding scale insulin, advance it as needed  #3 . Hyponatremia due to hyperglycemia and dehydration,  Improved sodium is at 136  continue IV fluids and hydrate, follow sodium level in the morning  #4. Obesity, lifestyle modifications recommended. lipid panel-LDL 102 and total cholesterol 184    All the records are reviewed and case discussed with Care Management/Social Workerr. Management plans discussed with the patient, family and they are in agreement.  CODE STATUS: fc   TOTAL TIME TAKING CARE OF THIS PATIENT: 33 minutes.   POSSIBLE D/C IN 2  DAYS, DEPENDING ON CLINICAL CONDITION.  Note: This dictation was prepared with Dragon dictation along with smaller phrase technology. Any transcriptional errors that result from this process are unintentional.   Nicholes Mango M.D on 02/25/2016 at 2:21 PM  Between 7am to 6pm - Pager - [phone] After 6pm go to [email or web address] - password [email or web address] [place] [facility]  Office  [phone]  CC: Primary care physician; [name], MD

## 2016-02-25 NOTE — Progress Notes (Signed)
PHARMACY - PHYSICIAN COMMUNICATION CRITICAL VALUE ALERT - BLOOD CULTURE IDENTIFICATION (BCID)  Results for orders placed or performed during the hospital encounter of 02/24/16  Blood Culture ID Panel (Reflexed) (Collected: 02/24/2016  4:43 PM)  Result Value Ref Range   Enterococcus species NOT DETECTED NOT DETECTED   Listeria monocytogenes NOT DETECTED NOT DETECTED   Staphylococcus species NOT DETECTED NOT DETECTED   Staphylococcus aureus NOT DETECTED NOT DETECTED   Streptococcus species DETECTED (A) NOT DETECTED   Streptococcus agalactiae DETECTED (A) NOT DETECTED   Streptococcus pneumoniae NOT DETECTED NOT DETECTED   Streptococcus pyogenes NOT DETECTED NOT DETECTED   Acinetobacter baumannii NOT DETECTED NOT DETECTED   Enterobacteriaceae species NOT DETECTED NOT DETECTED   Enterobacter cloacae complex NOT DETECTED NOT DETECTED   Escherichia coli NOT DETECTED NOT DETECTED   Klebsiella oxytoca NOT DETECTED NOT DETECTED   Klebsiella pneumoniae NOT DETECTED NOT DETECTED   Proteus species NOT DETECTED NOT DETECTED   Serratia marcescens NOT DETECTED NOT DETECTED   Haemophilus influenzae NOT DETECTED NOT DETECTED   Neisseria meningitidis NOT DETECTED NOT DETECTED   Pseudomonas aeruginosa NOT DETECTED NOT DETECTED   Candida albicans NOT DETECTED NOT DETECTED   Candida glabrata NOT DETECTED NOT DETECTED   Candida krusei NOT DETECTED NOT DETECTED   Candida parapsilosis NOT DETECTED NOT DETECTED   Candida tropicalis NOT DETECTED NOT DETECTED    Name of physician (or Provider) Contacted: Dr. Margaretmary Eddy  Changes to prescribed antibiotics required: Pt currently receiving piperacillin/tazobactam and vancomycin.  Recommended penicillin g 4 million units IV q 4 h per BCID protocol. If patient is able to tolerate PO, recommended amoxicillin 500 PO q 8h. MD prefers to see patient prior to adjusting antibiotics. Will follow up after MD sees patient.   Darrow Bussing, PharmD Pharmacy  Resident 02/25/2016 9:35 AM

## 2016-02-25 NOTE — Plan of Care (Signed)
Problem: Food- and Nutrition-Related Knowledge Deficit (NB-1.1) Goal: Nutrition education Formal process to instruct or train a patient/client in a skill or to impart knowledge to help patients/clients voluntarily manage or modify food choices and eating behavior to maintain or improve health. Outcome: Completed/Met Date Met: 02/25/16  RD consulted for nutrition education regarding diabetes.   Lab Results  Component Value Date   HGBA1C 14.0% 02/24/2016    RD provided "Carbohydrate Counting for People with Diabetes" handout from the Academy of Nutrition and Dietetics. Discussed different food groups and their effects on blood sugar, emphasizing carbohydrate-containing foods. Provided list of carbohydrates and recommended serving sizes of common foods.  Discussed importance of controlled and consistent carbohydrate intake throughout the day. Provided examples of ways to balance meals/snacks and encouraged intake of high-fiber, whole grain complex carbohydrates. Teach back method used.  Expect poor-fair compliance.  Body mass index is 33.72 kg/m. Pt meets criteria for obese based on current BMI.  Current diet order is carb modified, patient is consuming approximately 100% of meals at this time. Labs and medications reviewed. No further nutrition interventions warranted at this time. RD contact information provided. If additional nutrition issues arise, please re-consult RD.  [name]. Nikita Humble, MS, RD LDN Inpatient Clinical Dietitian Pager [phone]

## 2016-02-25 NOTE — Consult Note (Signed)
Patient Demographics  [name], is a 50 y.o. female   MRN: [id number]   DOB - May 22, 1965  Admit Date - 02/24/2016    Outpatient Primary MD for the patient is [name], MD  Consult requested in the Hospital by Nicholes Mango, MD, On 02/25/2016    Reason for consult infected second toe right foot secondary to diabetes   With History of -  Past Medical History:  Diagnosis Date  . Depression   . Diabetes mellitus without complication ([place])   . Hypertension   . Schizo affective schizophrenia [facility])       Past Surgical History:  Procedure Laterality Date  . CHOLECYSTECTOMY      in for   Chief Complaint  Patient presents with  . Wound Infection     HPI  [name]  is a 51 y.o. female, Patient had a chronic ulcer on the right second toe dorsally causing periodic problems currently has drainage from the area of the toe is swollen at the PIP joint and the toe significantly deformed. Patient is other lesions of tip summer other toes which are also contracted.    Review of Systems  patient is alert and well-oriented  In addition to the HPI above,  No Fever-chills, No Headache, No changes with Vision or hearing, No problems swallowing food or Liquids, No Chest pain, Cough or Shortness of Breath, No Abdominal pain, No Nausea or Vommitting, Bowel movements are regular, No Blood in stool or Urine, No dysuria, No new skin rashes or bruises, No new joints pains-aches,  No new weakness, tingling, numbness in any extremity, No recent weight gain or loss, No polyuria, polydypsia or polyphagia, No significant Mental Stressors.  A full 10 point Review of Systems was done, except as stated above, all other Review of Systems were negative.   Social History Social History  Substance  Use Topics  . Smoking status: Former Research scientist (life sciences)  . Smokeless tobacco: Never Used  . Alcohol use No    Family History Family History  Problem Relation Age of Onset  . Breast cancer Cousin     maternal cousin     Prior to Admission medications   Medication Sig Start Date End Date Taking? Authorizing Provider  ARIPiprazole (ABILIFY) 10 MG tablet Take 10 mg by mouth daily.   Yes Historical Provider, MD  gabapentin (NEURONTIN) 300 MG capsule Take 1 cap at bedtime for 1 week, increase to 1 AM and 1 bedtime for 1 week, increase to 1 AM, 1 PM, and 1 bedtime for total of 3 times day. Patient taking differently: Take 300 mg by mouth at bedtime.  09/17/15  Yes Amy Overton Mam, NP  Insulin Glargine (LANTUS) 100 UNIT/ML Solostar Pen Inject 16 Units into the skin daily at 10 pm. Patient taking differently: Inject 22 Units into the skin daily.  09/03/15  Yes Amy Overton Mam, NP  lisinopril (PRINIVIL,ZESTRIL) 20 MG tablet Take 1 tablet (20 mg total) by mouth daily. 06/29/15  Yes Arlis Porta., MD  metFORMIN (GLUCOPHAGE) 1000 MG tablet Take 1 tablet (1,000 mg total) by mouth 2 (two) times daily with a meal. 06/29/15  Yes Arlis Porta.,  MD  PARoxetine (PAXIL) 20 MG tablet Take 1 tablet (20 mg total) by mouth daily. 06/24/15  Yes Delman Kitten, MD  Blood Glucose Monitoring Suppl (ONE TOUCH ULTRA SYSTEM KIT) w/Device KIT 1 kit by Does not apply route once. 08/20/15   Arlis Porta., MD  glucose blood test strip Check blood sugars twice daily. 08/20/15   Arlis Porta., MD  Insulin Pen Needle (B-D UF III MINI PEN NEEDLES) 31G X 5 MM MISC 1 each by Does not apply route daily. 09/03/15   Amy Overton Mam, NP    Anti-infectives    Start     Dose/Rate Route Frequency Ordered Stop   02/25/16 0600  vancomycin (VANCOCIN) IVPB 1000 mg/200 mL premix  Status:  Discontinued     1,000 mg 200 mL/hr over 60 Minutes Intravenous Every 8 hours 02/24/16 2045 02/25/16 1100   02/24/16 2200  piperacillin-tazobactam  (ZOSYN) IVPB 3.375 g     3.375 g 12.5 mL/hr over 240 Minutes Intravenous Every 8 hours 02/24/16 2045     02/24/16 1645  vancomycin (VANCOCIN) 2,000 mg in sodium chloride 0.9 % 500 mL IVPB     2,000 mg 250 mL/hr over 120 Minutes Intravenous  Once 02/24/16 1630 02/24/16 2036   02/24/16 1630  vancomycin (VANCOCIN) injection 2,000 mg  Status:  Discontinued     2,000 mg Intravenous  Once 02/24/16 1627 02/24/16 1629   02/24/16 1630  piperacillin-tazobactam (ZOSYN) IVPB 3.375 g     3.375 g 100 mL/hr over 30 Minutes Intravenous  Once 02/24/16 1627 02/24/16 1755      Scheduled Meds: . ARIPiprazole  10 mg Oral Daily  . atorvastatin  40 mg Oral q1800  . docusate sodium  100 mg Oral BID  . enoxaparin (LOVENOX) injection  40 mg Subcutaneous Q24H  . gabapentin  300 mg Oral TID  . insulin aspart  0-15 Units Subcutaneous TID WC  . insulin aspart  0-5 Units Subcutaneous QHS  . insulin aspart  4 Units Subcutaneous TID WC  . insulin glargine  37 Units Subcutaneous Daily  . lisinopril  20 mg Oral Daily  . metFORMIN  1,000 mg Oral BID WC  . PARoxetine  20 mg Oral Daily  . piperacillin-tazobactam (ZOSYN)  IV  3.375 g Intravenous Q8H  . senna  1 tablet Oral BID  . sodium chloride flush  3 mL Intravenous Q12H   Continuous Infusions: . sodium chloride 125 mL/hr at 02/24/16 2139   PRN Meds:.HYDROcodone-acetaminophen, ondansetron **OR** ondansetron (ZOFRAN) IV  No Known Allergies  Physical Exam  Vitals  Blood pressure 123/64, pulse 98, temperature 99 F (37.2 C), temperature source Oral, resp. rate 18, height _0  (1.778 m), weight 106.6 kg (235 lb), SpO2 95 %.  Lower Extremity exam:  Vascular:DP PT pulses are +2 over 4 bilateral.  Dermatological: Patient has a wound approximately 5-6 mm diameter over the dorsal aspect of the second toe of the right foot. There is purulence draining from the area with compression there is an eschar which is dried over the dorsal aspect of it but erosion of  the skin likely all the way through the skin and soft tissue with possible bone underneath it exposure. Patient also has thickened deformed toenails that she does not take care of.  Neurological: Peripheral neuropathy associated with diabetes  Ortho: Significant hammertoe contractures 2345 bilaterally with second toe more pronounced subsequently developing the ulceration on the dorsal aspect of the digit.  Data Review  CBC  Recent  Labs Lab 02/24/16 1533 02/25/16 0431  WBC 6.3 6.0  HGB 14.2 11.9*  HCT 42.8 35.5  PLT 236 195  MCV 90.2 88.9  MCH 29.9 29.9  MCHC 33.1 33.6  RDW 16.0* 16.2*  LYMPHSABS 1.8  --   MONOABS 0.6  --   EOSABS 0.0  --   BASOSABS 0.1  --    ------------------------------------------------------------------------------------------------------------------  Chemistries   Recent Labs Lab 02/24/16 1533 02/25/16 0431  NA 125* 136  K 4.4 4.1  CL 93* 106  CO2 21* 24  GLUCOSE 698* 291*  BUN 15 13  CREATININE 0.96 0.67  CALCIUM 9.1 8.5*  MG 1.8  --   AST 31  --   ALT 28  --   ALKPHOS 114  --   BILITOT 0.7  --    ------------------------------------------------------------------------------------------------------------------ estimated creatinine clearance is 108.7 mL/min (by C-G formula based on SCr of 0.67 mg/dL). ------------------------------------------------------------------------------------------------------------------  Recent Labs  02/24/16 1533  TSH 1.471     ---------------------------------------------------------------------------------------------------------------  Urinalysis    Component Value Date/Time   COLORURINE STRAW (A) 02/24/2016 1808   APPEARANCEUR CLEAR (A) 02/24/2016 1808   APPEARANCEUR Clear 08/19/2012 1204   LABSPEC 1.029 02/24/2016 1808   LABSPEC 1.015 08/19/2012 1204   PHURINE 5.0 02/24/2016 1808   GLUCOSEU >500 (A) 02/24/2016 1808   GLUCOSEU >=500 08/19/2012 1204   HGBUR NEGATIVE 02/24/2016 1808    BILIRUBINUR NEGATIVE 02/24/2016 1808   BILIRUBINUR Negative 08/19/2012 1204   KETONESUR NEGATIVE 02/24/2016 1808   PROTEINUR NEGATIVE 02/24/2016 1808   NITRITE NEGATIVE 02/24/2016 1808   LEUKOCYTESUR NEGATIVE 02/24/2016 1808   LEUKOCYTESUR Trace 08/19/2012 1204     Imaging results:   Dg Foot Complete Right  Result Date: 02/24/2016 CLINICAL DATA:  Skin infection on toes for several weeks, initial encounter EXAM: RIGHT FOOT COMPLETE - 3+ VIEW COMPARISON:  None. FINDINGS: No acute fracture or dislocation is noted. No findings to suggest bony erosion or osteomyelitis are seen. Scattered soft tissue calcifications are noted. IMPRESSION: No acute abnormality seen. Electronically Signed   By: Inez Catalina M.D.   On: 02/24/2016 17:23    Assessment & Plan: Patient has obvious in frank infection in the PIP joint area of the second toe the right foot. No x-rays were negative for bone degenerative changes there is likely bone exposure to this area due to the erosive matter of the ulceration and the chronicity of the wound. 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 Drainage from the area. I discussed the case with Dr. Vickki Muff is to be taken call tomorrow. I think it would behoove her to have a surgery on this to go ahead then excise the ulcer and remove the underlying bone which would enable the chronic ulcer to be able to heal up. We'll also eradicate the likely hood of bone infection to the region and give her better chance for resolution to the problem. I explained this to the patient shows good pursue corrected measures on the area and surgical correction of the toe. I will go over consent form today she did this and she understands except risk and possible, case associated with the surgery.  Active Problems:   Uncontrolled diabetes mellitus ([place])   Diabetic foot ulcer ([place])   Acute osteomyelitis of foot ([place])     Family Communication: Plan discussed with patient    Thank you  for the consult, we will follow the patient with you in the Hospital.   Perry Mount M.D on 02/25/2016  at 6:12 PM

## 2016-02-25 NOTE — Progress Notes (Signed)
Pharmacy Antibiotic Note  [name] is a 50 y.o. female admitted on 02/24/2016 with infected diabetic foot ulcer with possible surgery 10/26. Pharmacy has been consulted for Zosyn dosing.   Plan: After conversation with provider will continue Zosyn EI 3.375g IV Q8hr.     Height: 5\' 10"  (177.8 cm) Weight: 235 lb (106.6 kg) IBW/kg (Calculated) : 68.5  Temp (24hrs), Avg:98.5 F (36.9 C), Min:98.2 F (36.8 C), Max:99 F (37.2 C)   Recent Labs Lab 02/24/16 1533 02/25/16 0431  WBC 6.3 6.0  CREATININE 0.96 0.67    Estimated Creatinine Clearance: 108.7 mL/min (by C-G formula based on SCr of 0.67 mg/dL).    No Known Allergies  Antimicrobials this admission: 10/25 Zosyn >>  10/25 Vancomycin >> 10/26    Dose adjustments this admission:  Microbiology results: 10/25 BCx: BCID - Streptococcus agalactiae 10/25 UCx: [place] will continue to monitor and adjust per consult.     MLS 02/25/2016 4:34 PM

## 2016-02-26 ENCOUNTER — Inpatient Hospital Stay: Payer: Medicare Other | Admitting: Anesthesiology

## 2016-02-26 ENCOUNTER — Encounter: Payer: Self-pay | Admitting: *Deleted

## 2016-02-26 ENCOUNTER — Encounter
Admission: EM | Disposition: A | Discharge status: Home / Self Care | Payer: Self-pay | Source: Home / Self Care | Attending: Internal Medicine

## 2016-02-26 HISTORY — PX: IRRIGATION AND DEBRIDEMENT FOOT: SHX6602

## 2016-02-26 LAB — URINE CULTURE: Culture: <10000 — AB

## 2016-02-26 LAB — GLUCOSE, CAPILLARY
Glucose-Capillary: 256 mg/dL — ABNORMAL HIGH (ref 65–99)
Glucose-Capillary: 219 mg/dL — ABNORMAL HIGH (ref 65–99)
Glucose-Capillary: 192 mg/dL — ABNORMAL HIGH (ref 65–99)
Glucose-Capillary: 161 mg/dL — ABNORMAL HIGH (ref 65–99)
Glucose-Capillary: 255 mg/dL — ABNORMAL HIGH (ref 65–99)
Glucose-Capillary: 229 mg/dL — ABNORMAL HIGH (ref 65–99)

## 2016-02-26 LAB — MRSA PCR SCREENING: MRSA by PCR: NEGATIVE

## 2016-02-26 SURGERY — IRRIGATION AND DEBRIDEMENT FOOT
Anesthesia: General | Site: Foot | Laterality: Right | Wound class: Clean Contaminated

## 2016-02-26 MED ORDER — MIDAZOLAM HCL 2 MG/2ML IJ SOLN
INTRAMUSCULAR | Status: DC | PRN
  Administered 2016-02-26 (×2): 1 mg via INTRAVENOUS

## 2016-02-26 MED ORDER — PROPOFOL 10 MG/ML IV BOLUS
INTRAVENOUS | Status: DC | PRN
  Administered 2016-02-26: 50 mg via INTRAVENOUS

## 2016-02-26 MED ORDER — BUPIVACAINE HCL 0.5 % IJ SOLN
INTRAMUSCULAR | Status: DC | PRN
  Administered 2016-02-26: 3.5 mL

## 2016-02-26 MED ORDER — LIDOCAINE HCL (PF) 1 % IJ SOLN
INTRAMUSCULAR | Status: AC
  Filled 2016-02-26: qty 30

## 2016-02-26 MED ORDER — OXYCODONE HCL 5 MG/5ML PO SOLN: 5.0000 mg | Freq: Once | ORAL | Status: DC | PRN

## 2016-02-26 MED ORDER — CEFAZOLIN SODIUM-DEXTROSE 2-4 GM/100ML-% IV SOLN
2.0000 g | Freq: Three times a day (TID) | INTRAVENOUS | Status: DC
  Administered 2016-02-26 – 2016-02-27 (×2): 2 g via INTRAVENOUS
  Filled 2016-02-26 (×4): qty 100

## 2016-02-26 MED ORDER — FENTANYL CITRATE (PF) 100 MCG/2ML IJ SOLN: 25.0000 ug | INTRAMUSCULAR | Status: DC | PRN

## 2016-02-26 MED ORDER — MEPERIDINE HCL 25 MG/ML IJ SOLN: 6.2500 mg | INTRAMUSCULAR | Status: DC | PRN

## 2016-02-26 MED ORDER — BUPIVACAINE HCL (PF) 0.5 % IJ SOLN
INTRAMUSCULAR | Status: AC
  Filled 2016-02-26: qty 30

## 2016-02-26 MED ORDER — OXYCODONE HCL 5 MG PO TABS: 5.0000 mg | ORAL_TABLET | Freq: Once | ORAL | Status: DC | PRN

## 2016-02-26 MED ORDER — PROMETHAZINE HCL 25 MG/ML IJ SOLN: 6.2500 mg | INTRAMUSCULAR | Status: DC | PRN

## 2016-02-26 MED ORDER — PROPOFOL 500 MG/50ML IV EMUL
INTRAVENOUS | Status: DC | PRN
  Administered 2016-02-26: 80 ug/kg/min via INTRAVENOUS

## 2016-02-26 MED ORDER — LIDOCAINE HCL (CARDIAC) 20 MG/ML IV SOLN
INTRAVENOUS | Status: DC | PRN
  Administered 2016-02-26: 20 mg via INTRAVENOUS

## 2016-02-26 MED ORDER — LIDOCAINE-EPINEPHRINE 1 %-1:100000 IJ SOLN
INTRAMUSCULAR | Status: AC
  Filled 2016-02-26: qty 1

## 2016-02-26 MED ORDER — FENTANYL CITRATE (PF) 100 MCG/2ML IJ SOLN
INTRAMUSCULAR | Status: DC | PRN
  Administered 2016-02-26 (×2): 50 ug via INTRAVENOUS

## 2016-02-26 MED ORDER — LIDOCAINE HCL 1 % IJ SOLN
INTRAMUSCULAR | Status: DC | PRN
  Administered 2016-02-26: 3.5 mL

## 2016-02-26 MED ORDER — CEFAZOLIN IN D5W 1 GM/50ML IV SOLN: 1.0000 g | Freq: Three times a day (TID) | INTRAVENOUS | Status: DC

## 2016-02-26 MED ORDER — LACTATED RINGERS IV SOLN
INTRAVENOUS | Status: DC | PRN
  Administered 2016-02-26: 10:00:00 via INTRAVENOUS

## 2016-02-26 SURGICAL SUPPLY — 57 items
BANDAGE ACE 4X5 VEL STRL LF (GAUZE/BANDAGES/DRESSINGS) ×2 IMPLANT
BANDAGE CONFORM 2X5YD N/S (GAUZE/BANDAGES/DRESSINGS) ×2 IMPLANT
BANDAGE STRETCH 3X4.1 STRL (GAUZE/BANDAGES/DRESSINGS) ×2 IMPLANT
BLADE OSC/SAGITTAL MD 5.5X18 (BLADE) ×2 IMPLANT
BLADE OSCILLATING/SAGITTAL (BLADE)
BLADE SW THK.38XMED LNG THN (BLADE) IMPLANT
BNDG COHESIVE 4X5 TAN STRL (GAUZE/BANDAGES/DRESSINGS) ×2 IMPLANT
BNDG COHESIVE 6X5 TAN STRL LF (GAUZE/BANDAGES/DRESSINGS) ×2 IMPLANT
BNDG ESMARK 4X12 TAN STRL LF (GAUZE/BANDAGES/DRESSINGS) ×2 IMPLANT
BNDG GAUZE 4.5X4.1 6PLY STRL (MISCELLANEOUS) ×2 IMPLANT
CANISTER SUCT 1200ML W/VALVE (MISCELLANEOUS) ×2 IMPLANT
CANISTER SUCT 3000ML (MISCELLANEOUS) ×2 IMPLANT
CUFF TOURN 18 STER (MISCELLANEOUS) ×2 IMPLANT
CUFF TOURN DUAL PL 12 NO SLV (MISCELLANEOUS) ×2 IMPLANT
DRAPE FLUOR MINI C-ARM 54X84 (DRAPES) IMPLANT
DRAPE XRAY CASSETTE 23X24 (DRAPES) IMPLANT
DRESSING ALLEVYN 4X4 (MISCELLANEOUS) IMPLANT
DURAPREP 26ML APPLICATOR (WOUND CARE) ×2 IMPLANT
ELECT REM PT RETURN 9FT ADLT (ELECTROSURGICAL) ×2
ELECTRODE REM PT RTRN 9FT ADLT (ELECTROSURGICAL) ×1 IMPLANT
GAUZE PACKING 1/4X5YD (GAUZE/BANDAGES/DRESSINGS) ×2 IMPLANT
GAUZE PACKING IODOFORM 1X5 (MISCELLANEOUS) ×2 IMPLANT
GAUZE PETRO XEROFOAM 1X8 (MISCELLANEOUS) ×2 IMPLANT
GAUZE SPONGE 4X4 12PLY STRL (GAUZE/BANDAGES/DRESSINGS) ×2 IMPLANT
GAUZE STRETCH 2X75IN STRL (MISCELLANEOUS) ×2 IMPLANT
GLOVE BIO SURGEON STRL SZ7.5 (GLOVE) ×2 IMPLANT
GLOVE INDICATOR 8.0 STRL GRN (GLOVE) ×2 IMPLANT
GOWN STRL REUS W/TWL MED LVL3 (GOWN DISPOSABLE) ×4 IMPLANT
HANDPIECE INTERPULSE COAX TIP (DISPOSABLE) ×1
HANDPIECE VERSAJET DEBRIDEMENT (MISCELLANEOUS) IMPLANT
IV NS 1000ML (IV SOLUTION) ×1
IV NS 1000ML BAXH (IV SOLUTION) ×1 IMPLANT
KIT RM TURNOVER STRD PROC AR (KITS) ×2 IMPLANT
LABEL OR SOLS (LABEL) ×2 IMPLANT
NEEDLE FILTER BLUNT 18X 1/2SAF (NEEDLE) ×1
NEEDLE FILTER BLUNT 18X1 1/2 (NEEDLE) ×1 IMPLANT
NEEDLE HYPO 25X1 1.5 SAFETY (NEEDLE) ×2 IMPLANT
NS IRRIG 500ML POUR BTL (IV SOLUTION) ×2 IMPLANT
PACK EXTREMITY ARMC (MISCELLANEOUS) ×2 IMPLANT
PAD ABD DERMACEA PRESS 5X9 (GAUZE/BANDAGES/DRESSINGS) ×2 IMPLANT
RASP SM TEAR CROSS CUT (RASP) IMPLANT
SET HNDPC FAN SPRY TIP SCT (DISPOSABLE) ×1 IMPLANT
SOL .9 NS 3000ML IRR  AL (IV SOLUTION) ×1
SOL .9 NS 3000ML IRR UROMATIC (IV SOLUTION) ×1 IMPLANT
SOL PREP PVP 2OZ (MISCELLANEOUS) ×2
SOLUTION PREP PVP 2OZ (MISCELLANEOUS) ×1 IMPLANT
STOCKINETTE IMPERVIOUS 9X36 MD (GAUZE/BANDAGES/DRESSINGS) ×2 IMPLANT
SUT ETHILON 2 0 FS 18 (SUTURE) ×4 IMPLANT
SUT ETHILON 4-0 (SUTURE) ×1
SUT ETHILON 4-0 FS2 18XMFL BLK (SUTURE) ×1
SUT VIC AB 3-0 SH 27 (SUTURE) ×1
SUT VIC AB 3-0 SH 27X BRD (SUTURE) ×1 IMPLANT
SUT VIC AB 4-0 FS2 27 (SUTURE) ×2 IMPLANT
SUTURE ETHLN 4-0 FS2 18XMF BLK (SUTURE) ×1 IMPLANT
SWAB CULTURE AMIES ANAERIB BLU (MISCELLANEOUS) IMPLANT
SYR 3ML LL SCALE MARK (SYRINGE) ×2 IMPLANT
SYRINGE 10CC LL (SYRINGE) ×4 IMPLANT

## 2016-02-26 NOTE — Op Note (Signed)
Operative note   Surgeon:Elexia Friedt    Assistant:none    Preop diagnosis:Ulceration with hammertoe right 2nd toe    Postop diagnosis: Same    Procedure: Arthroplasty PIPJ right second toe    EBL: Minimal    Anesthesia:local and IV sedation    Hemostasis: None    Specimen: Bone for pathology evaluation    Complications: None    Operative indications:[name] is an 50 y.o. that presents today for surgical intervention.  The risks/benefits/alternatives/complications have been discussed and consent has been given.    Procedure:  Patient was brought into the OR and placed on the operating table in thesupine position. After anesthesia was obtained theright lower extremity was prepped and draped in usual sterile fashion.  Attention was directed to the dorsal aspect of the second toe where an open ulceration was noted. The ulceration was debrided. Closure bone was noted immediately. This time full-thickness incision was taken proximal and distal to the PIPJ. The proximal phalanx and base of the middle phalanx were removed from the surgical field in toto. The wound was flushed with copious amounts or irrigation. Good realignment of the second toe was noted with debridement. The extensor tendon was reapproximated with a 4-0 Vicryl. The skin was closed with a 3-0 nylon. The central aspect was left open for drainage. A large bulky sterile dressing was applied.    Patient tolerated the procedure and anesthesia well.  Was transported from the OR to the PACU with all vital signs stable and vascular status intact. To be discharged per routine protocol to the floor.  Will follow up in approximately 1 week in the outpatient clinic.

## 2016-02-26 NOTE — Care Management Important Message (Signed)
Important Message  Patient Details  Name: [name] MRN: [id number] Date of Birth: August 03, 1965   Medicare Important Message Given:  Yes    Jolly Mango, RN 02/26/2016, 9:01 AM

## 2016-02-26 NOTE — Progress Notes (Signed)
[place] [facility] [place] NAME: [name]    MR#:  [id number]  DATE OF BIRTH:  08/20/1965  SUBJECTIVE:  CHIEF COMPLAINT:  Patient is  Scheduled  for surgery today  REVIEW OF SYSTEMS:  CONSTITUTIONAL: No fever, fatigue or weakness.  EYES: No blurred or double vision.  EARS, NOSE, AND THROAT: No tinnitus or ear pain.  RESPIRATORY: No cough, shortness of breath, wheezing or hemoptysis.  CARDIOVASCULAR: No chest pain, orthopnea, edema.  GASTROINTESTINAL: No nausea, vomiting, diarrhea or abdominal pain.  GENITOURINARY: No dysuria, hematuria.  ENDOCRINE: No polyuria, nocturia,  HEMATOLOGY: No anemia, easy bruising or bleeding SKIN:Right foot ulcer No rash or lesion. MUSCULOSKELETAL: No joint pain or arthritis.   NEUROLOGIC: No tingling, numbness, weakness.  PSYCHIATRY: No anxiety or depression.   DRUG ALLERGIES:  No Known Allergies  VITALS:  Blood pressure 119/81, pulse 71, temperature 98 F (36.7 C), temperature source Oral, resp. rate 16, height 5\' 10"  (1.778 m), weight 106.6 kg (235 lb), SpO2 96 %.  PHYSICAL EXAMINATION:  GENERAL:  50 y.o.-year-old patient lying in the bed with no acute distress.  EYES: Pupils equal, round, reactive to light and accommodation. No scleral icterus. Extraocular muscles intact.  HEENT: Head atraumatic, normocephalic. Oropharynx and nasopharynx clear.  NECK:  Supple, no jugular venous distention. No thyroid enlargement, no tenderness.  LUNGS: Normal breath sounds bilaterally, no wheezing, rales,rhonchi or crepitation. No use of accessory muscles of respiration.  CARDIOVASCULAR: S1, S2 normal. No murmurs, rubs, or gallops.  ABDOMEN: Soft, nontender, nondistended. Bowel sounds present. No organomegaly or mass.  EXTREMITIES:   right second toe revealed redness and necrotic area and swelling, dorsal aspect of the middle phalanx reveals ulceration, no drainage or pain on palpation, no fluctuations, no  cyanosis, or clubbing. Onychomycosis. Peripheral pulses are goodNo pedal edema, cyanosis, or clubbing.  NEUROLOGIC: Cranial nerves II through XII are intact. Muscle strength 5/5 in all extremities. Sensation intact. Gait not checked.  PSYCHIATRIC: The patient is alert and oriented x 3.  SKIN: No obvious rash, lesion, or ulcer.    LABORATORY PANEL:   CBC  Recent Labs Lab 02/25/16 0431  WBC 6.0  HGB 11.9*  HCT 35.5  PLT 195   ------------------------------------------------------------------------------------------------------------------  Chemistries   Recent Labs Lab 02/24/16 1533 02/25/16 0431  NA 125* 136  K 4.4 4.1  CL 93* 106  CO2 21* 24  GLUCOSE 698* 291*  BUN 15 13  CREATININE 0.96 0.67  CALCIUM 9.1 8.5*  MG 1.8  --   AST 31  --   ALT 28  --   ALKPHOS 114  --   BILITOT 0.7  --    ------------------------------------------------------------------------------------------------------------------  Cardiac Enzymes No results for input(s): TROPONINI in the last 168 hours. ------------------------------------------------------------------------------------------------------------------  RADIOLOGY:  Dg Foot Complete Right  Result Date: 02/24/2016 CLINICAL DATA:  Skin infection on toes for several weeks, initial encounter EXAM: RIGHT FOOT COMPLETE - 3+ VIEW COMPARISON:  None. FINDINGS: No acute fracture or dislocation is noted. No findings to suggest bony erosion or osteomyelitis are seen. Scattered soft tissue calcifications are noted. IMPRESSION: No acute abnormality seen. Electronically Signed   By: Inez Catalina M.D.   On: 02/24/2016 17:23    EKG:   Orders placed or performed during the hospital encounter of 02/24/16  . ED EKG  . ED EKG  . EKG 12-Lead  . EKG 12-Lead    ASSESSMENT AND PLAN:    #1. Infected diabetic foot ulcer POD#0 Podiatry  consult is appreciated  One out of 4 bottles of cultures with group B streptococcus. Discontinue vancomycin and  IV Zosyn changed to IV cefazolin   Follow-up blood cultures, wound cultures  X-ray with no acute abnormalities  #Abnormal urinalysis urine culture positive for greater than 100,000 colonies Continue cephalosporins, follow-up on the final culture result and narrow down the antibiotic  #2. Uncontrolled diabetes mellitus with hemoglobin A1c of 14.0  get diabetic education  continue insulin Lantus and sliding scale insulin, advance it as needed  #3 . Hyponatremia due to hyperglycemia and dehydration,  Improved sodium is at 136  continue IV fluids and hydrate, follow sodium level in the morning  #4. Obesity, lifestyle modifications recommended. lipid panel-LDL 102 and total cholesterol 184    All the records are reviewed and case discussed with Care Management/Social Workerr. Management plans discussed with the patient, [name] a [phone] and they are in agreement.  CODE STATUS: fc   TOTAL TIME TAKING CARE OF THIS PATIENT: 33 minutes.   POSSIBLE D/C IN 2  DAYS, DEPENDING ON CLINICAL CONDITION.  Note: This dictation was prepared with Dragon dictation along with smaller phrase technology. Any transcriptional errors that result from this process are unintentional.   [name] M.D on 02/26/2016 at 3:19 PM  Between 7am to 6pm - Pager - [phone] After 6pm go to [email or web address] - password [email or web address] [facility]  Office  [phone]  CC: Primary care physician; [name], MD

## 2016-02-26 NOTE — Transfer of Care (Signed)
Immediate Anesthesia Transfer of Care Note  Patient: [name]  Procedure(s) Performed: Procedure(s): RIGHT 2ND TOE DEBRIDEMENT (Right)  Patient Location: PACU  Anesthesia Type:MAC  Level of Consciousness: awake, alert  and oriented  Airway & Oxygen Therapy: Patient Spontanous Breathing and Patient connected to face mask oxygen  Post-op Assessment: Report given to RN and Post -op Vital signs reviewed and stable  Post vital signs: Reviewed and stable  Last Vitals:  Vitals:   02/26/16 0750 02/26/16 1003  BP: 140/89 119/80  Pulse: 87 74  Resp: 16 15  Temp:  36.4 C    Last Pain:  Vitals:   02/26/16 0800  TempSrc:   PainSc: 0-No pain      Patients Stated Pain Goal: 0 (16/10/96 0454)  Complications: No apparent anesthesia complications

## 2016-02-26 NOTE — Progress Notes (Signed)
Dr Randa [name] called  Blood sugar 192  No new orders

## 2016-02-26 NOTE — H&P (Signed)
HISTORY AND PHYSICAL INTERVAL NOTE:  02/26/2016  10:13 AM  [name]  has presented today for surgery, with the diagnosis of RIGHT 2ND TOE.  The various methods of treatment have been discussed with the patient.  No guarantees were given.  After consideration of risks, benefits and other options for treatment, the patient has consented to surgery.  I have reviewed the patients' chart and labs.    Patient Vitals for the past 24 hrs:  BP Temp Temp src Pulse Resp SpO2 Height Weight  02/26/16 1003 119/80 97.5 F (36.4 C) - 74 15 99 % 5\' 10"  (1.778 m) 106.6 kg (235 lb)  02/26/16 0750 140/89 - - 87 16 100 % - -  02/26/16 0415 131/83 98 F (36.7 C) Oral 84 19 98 % - -  02/25/16 1930 106/73 98.7 F (37.1 C) Oral 92 19 97 % - -  02/25/16 1610 123/64 99 F (37.2 C) Oral 98 18 95 % - -     The patient was reexamined.  There have been no changes to this history and physical examination.  Samara Deist A

## 2016-02-26 NOTE — Progress Notes (Addendum)
Pharmacy Antibiotic Note  [name] is a 50 y.o. female admitted on 02/24/2016 with infected diabetic foot ulcer. Pharmacy has been consulted for cefazolin dosing.   Plan: Will transition patient from piperacillin/tazobactam to cefazolin 2 g IV q 8 starting tonight for GBS bacteremia. Will follow susceptibilities.  Height: 5\' 10"  (177.8 cm) Weight: 235 lb (106.6 kg) IBW/kg (Calculated) : 68.5  Temp (24hrs), Avg:97.9 F (36.6 C), Min:96.6 F (35.9 C), Max:99 F (37.2 C)   Recent Labs Lab 02/24/16 1533 02/25/16 0431  WBC 6.3 6.0  CREATININE 0.96 0.67    Estimated Creatinine Clearance: 108.7 mL/min (by C-G formula based on SCr of 0.67 mg/dL).    No Known Allergies  Antimicrobials this admission: 10/25 Zosyn >> 10/27 10/25 Vancomycin >> 10/26   10/27 cefazolin >>  Dose adjustments this admission: Zosyn -> cefazolin  Microbiology results: 10/25 BCx: Pending: Streptococcus agalactiae 10/25 UCx: insignificant growth  Pharmacy will continue to monitor and adjust per consult.    Darrow Bussing, PharmD Pharmacy Resident 02/26/2016 4:02 PM

## 2016-02-26 NOTE — Anesthesia Preprocedure Evaluation (Signed)
Anesthesia Evaluation   Patient awake    Reviewed: Allergy & Precautions, NPO status , Patient's Chart, lab work & pertinent test results  History of Anesthesia Complications Negative for: history of anesthetic complications  Airway Mallampati: II  TM Distance: >3 FB Neck ROM: Full    Dental  (+) Poor Dentition, Missing   Pulmonary neg sleep apnea, neg COPD, former smoker,    breath sounds clear to auscultation- rhonchi (-) wheezing      Cardiovascular Exercise Tolerance: Good hypertension, Pt. on medications (-) CAD and (-) Past MI  Rhythm:Regular Rate:Normal - Systolic murmurs and - Diastolic murmurs    Neuro/Psych Depression Schizophrenia    GI/Hepatic negative GI ROS, Neg liver ROS,   Endo/Other  diabetes, Poorly Controlled, Type 2, Oral Hypoglycemic Agents, Insulin Dependent  Renal/GU negative Renal ROS     Musculoskeletal negative musculoskeletal ROS (+)   Abdominal (+) + obese,   Peds  Hematology   Anesthesia Other Findings Past Medical History: No date: Depression No date: Diabetes mellitus without complication (HCC) No date: Hypertension No date: Schizo affective schizophrenia ([place])     Comment: abilify and pazil   Reproductive/Obstetrics                             Anesthesia Physical Anesthesia Plan  ASA: III  Anesthesia Plan: General   Post-op Pain Management:    Induction: Intravenous  Airway Management Planned: Natural Airway  Additional Equipment:   Intra-op Plan:   Post-operative Plan:   Informed Consent: I have reviewed the patients History and Physical, chart, labs and discussed the procedure including the risks, benefits and alternatives for the proposed anesthesia with the patient or authorized representative who has indicated his/her understanding and acceptance.   Dental advisory given  Plan Discussed with: Anesthesiologist and CRNA  Anesthesia  Plan Comments:         Anesthesia Quick Evaluation

## 2016-02-27 LAB — CBC WITH DIFFERENTIAL/PLATELET
Basophils Absolute: 0 10*3/uL (ref 0–0.1)
Basophils Relative: 1 %
Eosinophils Absolute: 0.2 10*3/uL (ref 0–0.7)
Eosinophils Relative: 3 %
HCT: 34.8 % — ABNORMAL LOW (ref 35.0–47.0)
Hemoglobin: 11.7 g/dL — ABNORMAL LOW (ref 12.0–16.0)
Lymphocytes Relative: 38 %
Lymphs Abs: 2.7 10*3/uL (ref 1.0–3.6)
MCH: 29.6 pg (ref 26.0–34.0)
MCHC: 33.6 g/dL (ref 32.0–36.0)
MCV: 88.1 fL (ref 80.0–100.0)
Monocytes Absolute: 0.5 10*3/uL (ref 0.2–0.9)
Monocytes Relative: 8 %
Neutro Abs: 3.5 10*3/uL (ref 1.4–6.5)
Neutrophils Relative %: 50 %
Platelets: 203 10*3/uL (ref 150–440)
RBC: 3.94 MIL/uL (ref 3.80–5.20)
RDW: 15.3 % — ABNORMAL HIGH (ref 11.5–14.5)
WBC: 7 10*3/uL (ref 3.6–11.0)

## 2016-02-27 LAB — CULTURE, BLOOD (ROUTINE X 2)

## 2016-02-27 LAB — BASIC METABOLIC PANEL
Anion gap: 6 (ref 5–15)
BUN: 8 mg/dL (ref 6–20)
CO2: 22 mmol/L (ref 22–32)
Calcium: 8.6 mg/dL — ABNORMAL LOW (ref 8.9–10.3)
Chloride: 109 mmol/L (ref 101–111)
Creatinine, Ser: 0.65 mg/dL (ref 0.44–1.00)
GFR calc Af Amer: >60 mL/min (ref >60)
GFR calc non Af Amer: >60 mL/min (ref >60)
Glucose, Bld: 234 mg/dL — ABNORMAL HIGH (ref 65–99)
Potassium: 3.9 mmol/L (ref 3.5–5.1)
Sodium: 137 mmol/L (ref 135–145)

## 2016-02-27 LAB — GLUCOSE, CAPILLARY: Glucose-Capillary: 218 mg/dL — ABNORMAL HIGH (ref 65–99)

## 2016-02-27 MED ORDER — INSULIN GLARGINE 100 UNIT/ML SOLOSTAR PEN: 37.0000 [IU] | PEN_INJECTOR | Freq: Every day | SUBCUTANEOUS | 11 refills | Status: DC

## 2016-02-27 MED ORDER — AMOXICILLIN-POT CLAVULANATE 875-125 MG PO TABS
1.0000 | ORAL_TABLET | Freq: Two times a day (BID) | ORAL | Status: DC
  Administered 2016-02-27: 1 via ORAL
  Filled 2016-02-27: qty 1

## 2016-02-27 MED ORDER — AMOXICILLIN-POT CLAVULANATE 875-125 MG PO TABS: 1.0000 | ORAL_TABLET | Freq: Two times a day (BID) | ORAL | 0 refills | Status: DC

## 2016-02-27 MED ORDER — ATORVASTATIN CALCIUM 40 MG PO TABS: 40.0000 mg | ORAL_TABLET | Freq: Every day | ORAL | 0 refills | Status: DC

## 2016-02-27 NOTE — Discharge Instructions (Signed)
Dressing instruction per Dr Vickki Muff

## 2016-02-27 NOTE — Progress Notes (Signed)
Discharge instructions/prescriptions given; acknowledged understanding. Discharged to home.

## 2016-02-27 NOTE — Progress Notes (Signed)
Daily Progress Note   Subjective  - 1 Day Post-Op  Patient is status post excision of bone second toe. Doing well  Objective Vitals:   02/26/16 1632 02/26/16 2006 02/27/16 0350 02/27/16 0732  BP: (!) 105/59 112/74 118/71 140/88  Pulse: 86 87 90 78  Resp: 16 19 18 18   Temp: 98.2 F (36.8 C) 98.4 F (36.9 C) 98.4 F (36.9 C)   TempSrc: Oral Oral Oral   SpO2: 97% 97% 95% 97%  Weight:      Height:        Physical Exam: The second toe incision site is coapting nicely. There is no purulent drainage. The central aspect was left open for drainage. The erythema is decreased to the toe.   Laboratory CBC    Component Value Date/Time   WBC 7.0 02/27/2016 0528   HGB 11.7 (L) 02/27/2016 0528   HGB 15.2 08/19/2012 1224   HCT 34.8 (L) 02/27/2016 0528   HCT 45.7 08/19/2012 1224   PLT 203 02/27/2016 0528   PLT 203 08/19/2012 1224    BMET    Component Value Date/Time   NA 136 02/25/2016 0431   NA 133 (L) 08/19/2012 1224   K 4.1 02/25/2016 0431   K 3.1 (L) 08/19/2012 1224   CL 106 02/25/2016 0431   CL 99 08/19/2012 1224   CO2 24 02/25/2016 0431   CO2 23 08/19/2012 1224   GLUCOSE 291 (H) 02/25/2016 0431   GLUCOSE 331 (H) 08/19/2012 1224   BUN 13 02/25/2016 0431   BUN 15 08/19/2012 1224   CREATININE 0.67 02/25/2016 0431   CREATININE 0.86 08/19/2012 1224   CALCIUM 8.5 (L) 02/25/2016 0431   CALCIUM 8.9 08/19/2012 1224   GFRNONAA >60 02/25/2016 0431   GFRNONAA >60 08/19/2012 1224   GFRAA >60 02/25/2016 0431   GFRAA >60 08/19/2012 1224    Assessment/Planning: From podiatry standpoint she is stable for discharge. I did perform a wound culture is no wound culture has been performed of yet. The wound is markedly improving though. At this point could discharge on Augmentin and will follow-up outpatient. I'll see her in a week      Elesa Hacker  02/27/2016, 9:44 AM

## 2016-02-27 NOTE — Discharge Summary (Signed)
[place] [facility] [place] NAME: [name]    MR#:  [id number]  DATE OF BIRTH:  February 15, 1966  DATE OF ADMISSION:  02/24/2016 ADMITTING PHYSICIAN: Theodoro Grist, MD  DATE OF DISCHARGE: 02/27/16  PRIMARY CARE PHYSICIAN: [name], MD    ADMISSION DIAGNOSIS:  Sinus tachycardia [R00.0] Hyponatremia [E87.1] Hyperglycemia [R73.9] Diabetic foot ulcer ([name]) [R74.081, L97.509] Wound infection [T14.8XXA, L08.9]  DISCHARGE DIAGNOSIS:  Diabetic foot ulcer s/p Arthroplasty PIP right second toe DM-2 on insulin  SECONDARY DIAGNOSIS:   Past Medical History:  Diagnosis Date  . Depression   . Diabetes mellitus without complication ([name])   . Hypertension   . Schizo affective schizophrenia ([name])    abilify and pazil    HOSPITAL COURSE:   #1. Infected diabetic foot ulcer POD# 1 s/p right 2nd toe PIP foot ulcer Podiatry consult is appreciated  One out of 4 bottles of cultures with group B streptococcus. Change to po augmentin X-ray with no acute abnormalities  #2. Uncontrolled diabetes mellitus with hemoglobin A1c of 14.0  get diabetic education  continue insulin Lantus and sliding scale insulin, advance it as needed -adjusted dose of Lantus  #3 . Hyponatremia due to hyperglycemia and dehydration,  Improved sodium is at 136  Doing well  #4. Obesity, lifestyle modifications recommended.  #5 HL  lipid panel-LDL 102 and total cholesterol 184, elevated TG -now on statins  Dressing instructions given D/c home with f/u podiatry next week   CONSULTS OBTAINED:  Treatment Team:  [name], DPM [name], MD  DRUG ALLERGIES:  No Known Allergies  DISCHARGE MEDICATIONS:   Current Discharge Medication List    START taking these medications   Details  amoxicillin-clavulanate (AUGMENTIN) 875-125 MG tablet Take 1 tablet by mouth every 12 (twelve) hours. Qty: 24 tablet, Refills: 0    atorvastatin (LIPITOR)  40 MG tablet Take 1 tablet (40 mg total) by mouth daily at 6 PM. Qty: 30 tablet, Refills: 0      CONTINUE these medications which have CHANGED   Details  Insulin Glargine (LANTUS) 100 UNIT/ML Solostar Pen Inject 37 Units into the skin daily at 10 pm. Qty: 15 mL, Refills: 11   Associated Diagnoses: Uncontrolled type 2 diabetes mellitus without complication, with long-term current use of insulin (HCC)      CONTINUE these medications which have NOT CHANGED   Details  ARIPiprazole (ABILIFY) 10 MG tablet Take 10 mg by mouth daily.    Blood Glucose Monitoring Suppl (ONE TOUCH ULTRA SYSTEM KIT) w/Device KIT 1 kit by Does not apply route once. Qty: 1 each, Refills: 0    gabapentin (NEURONTIN) 300 MG capsule Take 1 cap at bedtime for 1 week, increase to 1 AM and 1 bedtime for 1 week, increase to 1 AM, 1 PM, and 1 bedtime for total of 3 times day. Qty: 90 capsule, Refills: 3   Associated Diagnoses: Polyneuropathy (HCC)    lisinopril (PRINIVIL,ZESTRIL) 20 MG tablet Take 1 tablet (20 mg total) by mouth daily. Qty: 90 tablet, Refills: 3   Associated Diagnoses: Essential hypertension    metFORMIN (GLUCOPHAGE) 1000 MG tablet Take 1 tablet (1,000 mg total) by mouth 2 (two) times daily with a meal. Qty: 60 tablet, Refills: 12   Associated Diagnoses: Uncontrolled type 2 diabetes mellitus without complication, with long-term current use of insulin (HCC)    PARoxetine (PAXIL) 20 MG tablet Take 1 tablet (20 mg total) by mouth daily. Qty: 30 tablet, Refills: 0  glucose blood test strip Check blood sugars twice daily. Qty: 100 each, Refills: 12    Insulin Pen Needle (B-D UF III MINI PEN NEEDLES) 31G X 5 MM MISC 1 each by Does not apply route daily. Qty: 100 each, Refills: 3   Associated Diagnoses: Uncontrolled type 2 diabetes mellitus without complication, with long-term current use of insulin ([name])        If you experience worsening of your admission symptoms, develop shortness of breath, life  threatening emergency, suicidal or homicidal thoughts you must seek medical attention immediately by calling 911 or calling your MD immediately  if symptoms less severe.  You Must read complete instructions/literature along with all the possible adverse reactions/side effects for all the Medicines you take and that have been prescribed to you. Take any new Medicines after you have completely understood and accept all the possible adverse reactions/side effects.   Please note  You were cared for by a hospitalist during your hospital stay. If you have any questions about your discharge medications or the care you received while you were in the hospital after you are discharged, you can call the unit and asked to speak with the hospitalist on call if the hospitalist that took care of you is not available. Once you are discharged, your primary care physician will handle any further medical issues. Please note that NO REFILLS for any discharge medications will be authorized once you are discharged, as it is imperative that you return to your primary care physician (or establish a relationship with a primary care physician if you do not have one) for your aftercare needs so that they can reassess your need for medications and monitor your lab values. Today   SUBJECTIVE     Doing well VITAL SIGNS:  Blood pressure 140/88, pulse 78, temperature 98.4 F (36.9 C), temperature source Oral, resp. rate 18, height '5\' 10"'  (1.778 m), weight 106.6 kg (235 lb), SpO2 97 %.  I/O:   Intake/Output Summary (Last 24 hours) at 02/27/16 1020 Last data filed at 02/27/16 0259  Gross per 24 hour  Intake          3176.42 ml  Output                1 ml  Net          3175.42 ml    PHYSICAL EXAMINATION:  GENERAL:  50 y.o.-year-old patient lying in the bed with no acute distress.  EYES: Pupils equal, round, reactive to light and accommodation. No scleral icterus. Extraocular muscles intact.  HEENT: Head atraumatic,  normocephalic. Oropharynx and nasopharynx clear.  NECK:  Supple, no jugular venous distention. No thyroid enlargement, no tenderness.  LUNGS: Normal breath sounds bilaterally, no wheezing, rales,rhonchi or crepitation. No use of accessory muscles of respiration.  CARDIOVASCULAR: S1, S2 normal. No murmurs, rubs, or gallops.  ABDOMEN: Soft, non-tender, non-distended. Bowel sounds present. No organomegaly or mass.  EXTREMITIES: No pedal edema, cyanosis, or clubbing. Right foot dressing+ NEUROLOGIC: Cranial nerves II through XII are intact. Muscle strength 5/5 in all extremities. Sensation intact. Gait not checked.  PSYCHIATRIC: The patient is alert and oriented x 3.  SKIN: No obvious rash, lesion, or ulcer.   DATA REVIEW:   CBC   Recent Labs Lab 02/27/16 0528  WBC 7.0  HGB 11.7*  HCT 34.8*  PLT 203    Chemistries   Recent Labs Lab 02/24/16 1533 02/25/16 0431  NA 125* 136  K 4.4 4.1  CL 93* 106  CO2 21* 24  GLUCOSE 698* 291*  BUN 15 13  CREATININE 0.96 0.67  CALCIUM 9.1 8.5*  MG 1.8  --   AST 31  --   ALT 28  --   ALKPHOS 114  --   BILITOT 0.7  --     Microbiology Results   Recent Results (from the past 240 hour(s))  Blood culture (routine x 2)     Status: Abnormal (Preliminary result)   Collection Time: 02/24/16  4:43 PM  Result Value Ref Range Status   Specimen Description BLOOD RIGHT FATTY CASTS  Final   Special Requests BOTTLES DRAWN AEROBIC AND ANAEROBIC [id number]  Final   Culture  Setup Time   Final    Organism ID to follow GRAM POSITIVE COCCI AEROBIC BOTTLE ONLY CRITICAL RESULT CALLED TO, READ BACK BY AND VERIFIED WITH: CHRISTINE KATSOUDAS 02/25/16 0912 SGD    Culture (A)  Final    GROUP B STREP(S.AGALACTIAE)ISOLATED SUSCEPTIBILITIES TO FOLLOW Virtually 100% of S. agalactiae (Group B) strains are susceptible to Penicillin.  For Penicillin-allergic patients, Erythromycin (85-95% sensitive) and Clindamycin (80% sensitive) are drugs of choice. Contact  microbiology lab to request sensitivities if  needed within 7 days. Performed at [facility]    Report Status PENDING  Incomplete  Blood culture (routine x 2)     Status: None (Preliminary result)   Collection Time: 02/24/16  4:43 PM  Result Value Ref Range Status   Specimen Description BLOOD RIGHT ASSIST CONTROL  Final   Special Requests BOTTLES DRAWN AEROBIC AND ANAEROBIC [place]  Final   Culture NO GROWTH 2 DAYS  Final   Report Status PENDING  Incomplete  Blood Culture ID Panel (Reflexed)     Status: Abnormal   Collection Time: 02/24/16  4:43 PM  Result Value Ref Range Status   Enterococcus species NOT DETECTED NOT DETECTED Final   Listeria monocytogenes NOT DETECTED NOT DETECTED Final   Staphylococcus species NOT DETECTED NOT DETECTED Final   Staphylococcus aureus NOT DETECTED NOT DETECTED Final   Streptococcus species DETECTED (A) NOT DETECTED Final    Comment: CRITICAL RESULT CALLED TO, READ BACK BY AND VERIFIED WITH: CHRISTINE KATSOUDAS 02/25/16 0912 SGD    Streptococcus agalactiae DETECTED (A) NOT DETECTED Final    Comment: CRITICAL RESULT CALLED TO, READ BACK BY AND VERIFIED WITH: CHRISTINE KATSOUDAS 02/25/16 0912 SGD    Streptococcus pneumoniae NOT DETECTED NOT DETECTED Final   Streptococcus pyogenes NOT DETECTED NOT DETECTED Final   Acinetobacter baumannii NOT DETECTED NOT DETECTED Final   Enterobacteriaceae species NOT DETECTED NOT DETECTED Final   Enterobacter cloacae complex NOT DETECTED NOT DETECTED Final   Escherichia coli NOT DETECTED NOT DETECTED Final   Klebsiella oxytoca NOT DETECTED NOT DETECTED Final   Klebsiella pneumoniae NOT DETECTED NOT DETECTED Final   Proteus species NOT DETECTED NOT DETECTED Final   Serratia marcescens NOT DETECTED NOT DETECTED Final   Haemophilus influenzae NOT DETECTED NOT DETECTED Final   Neisseria meningitidis NOT DETECTED NOT DETECTED Final   Pseudomonas aeruginosa NOT DETECTED NOT DETECTED Final   Candida  albicans NOT DETECTED NOT DETECTED Final   Candida glabrata NOT DETECTED NOT DETECTED Final   Candida krusei NOT DETECTED NOT DETECTED Final   Candida parapsilosis NOT DETECTED NOT DETECTED Final   Candida tropicalis NOT DETECTED NOT DETECTED Final  Urine culture     Status: Abnormal   Collection Time: 02/24/16  6:08 PM  Result Value Ref Range Status   Specimen Description URINE, CLEAN CATCH  Final   Special Requests Immunocompromised  Final   Culture (A)  Final    <10,000 COLONIES/mL INSIGNIFICANT GROWTH Performed at [facility]    Report Status 02/26/2016 FINAL  Final  MRSA PCR Screening     Status: None   Collection Time: 02/26/16  7:49 AM  Result Value Ref Range Status   MRSA by PCR NEGATIVE NEGATIVE Final    Comment:        The GeneXpert MRSA Assay (FDA approved for NASAL specimens only), is one component of a comprehensive MRSA colonization surveillance program. It is not intended to diagnose MRSA infection nor to guide or monitor treatment for MRSA infections.     RADIOLOGY:  No results found.   Management plans discussed with the patient, family and they are in agreement.  CODE STATUS:     Code Status Orders        Start     Ordered   02/24/16 2105  Full code  Continuous     02/24/16 2104    Code Status History    Date Active Date Inactive Code Status Order ID Comments User Context   This patient has a current code status but no historical code status.      TOTAL TIME TAKING CARE OF THIS PATIENT: 40 minutes.    Lexany Belknap M.D on 02/27/2016 at 10:20 AM  Between 7am to 6pm - Pager - [phone] After 6pm go to [email or web address] - password [email or web address]  Office  [phone]  CC: Primary care physician; [name], MD

## 2016-02-29 ENCOUNTER — Other Ambulatory Visit: Payer: Self-pay | Admitting: Family Medicine

## 2016-02-29 ENCOUNTER — Encounter: Payer: Self-pay | Admitting: Podiatry

## 2016-02-29 DIAGNOSIS — Z794 Long term (current) use of insulin: Principal | ICD-10-CM

## 2016-02-29 DIAGNOSIS — E1165 Type 2 diabetes mellitus with hyperglycemia: Principal | ICD-10-CM

## 2016-02-29 DIAGNOSIS — IMO0001 Reserved for inherently not codable concepts without codable children: Secondary | ICD-10-CM

## 2016-02-29 LAB — CULTURE, BLOOD (ROUTINE X 2): Culture: NO GROWTH

## 2016-02-29 LAB — SURGICAL PATHOLOGY

## 2016-02-29 MED ORDER — METFORMIN HCL 1000 MG PO TABS: 1000.0000 mg | ORAL_TABLET | Freq: Two times a day (BID) | ORAL | 11 refills | Status: DC

## 2016-02-29 NOTE — Telephone Encounter (Signed)
Pt. Called requesting a refill on metformin [facility] [address] [place]> pt  vall back # is  [phone]

## 2016-03-01 DIAGNOSIS — L97511 Non-pressure chronic ulcer of other part of right foot limited to breakdown of skin: Secondary | ICD-10-CM | POA: Diagnosis not present

## 2016-03-01 NOTE — Anesthesia Postprocedure Evaluation (Signed)
Anesthesia Post Note  Patient: [name]  Procedure(s) Performed: Procedure(s) (LRB): RIGHT 2ND TOE DEBRIDEMENT (Right)  Patient location during evaluation: PACU Anesthesia Type: General Level of consciousness: awake and alert Pain management: pain level controlled Vital Signs Assessment: post-procedure vital signs reviewed and stable Respiratory status: spontaneous breathing, nonlabored ventilation and respiratory function stable Cardiovascular status: blood pressure returned to baseline and stable Postop Assessment: no signs of nausea or vomiting Anesthetic complications: no    Last Vitals:  Vitals:   02/27/16 0350 02/27/16 0732  BP: 118/71 140/88  Pulse: 90 78  Resp: 18 18  Temp: 36.9 C     Last Pain:  Vitals:   02/27/16 0350  TempSrc: Oral  PainSc:                  [name]

## 2016-03-11 ENCOUNTER — Ambulatory Visit (INDEPENDENT_AMBULATORY_CARE_PROVIDER_SITE_OTHER): Payer: Medicare Other | Admitting: Family Medicine

## 2016-03-11 ENCOUNTER — Encounter: Payer: Self-pay | Admitting: Family Medicine

## 2016-03-11 VITALS — BP 122/84 | HR 101 | Temp 98.2°F | Resp 16 | Ht 70.5 in | Wt 239.6 lb

## 2016-03-11 DIAGNOSIS — L97512 Non-pressure chronic ulcer of other part of right foot with fat layer exposed: Secondary | ICD-10-CM | POA: Diagnosis not present

## 2016-03-11 DIAGNOSIS — E1165 Type 2 diabetes mellitus with hyperglycemia: Secondary | ICD-10-CM | POA: Diagnosis not present

## 2016-03-11 DIAGNOSIS — E11621 Type 2 diabetes mellitus with foot ulcer: Secondary | ICD-10-CM | POA: Diagnosis not present

## 2016-03-11 DIAGNOSIS — E1142 Type 2 diabetes mellitus with diabetic polyneuropathy: Secondary | ICD-10-CM

## 2016-03-11 DIAGNOSIS — Z794 Long term (current) use of insulin: Secondary | ICD-10-CM

## 2016-03-11 DIAGNOSIS — IMO0002 Reserved for concepts with insufficient information to code with codable children: Secondary | ICD-10-CM

## 2016-03-11 NOTE — Assessment & Plan Note (Signed)
Uncontrolled DM2, last A1c >14% likely due to prior non adherence with insulin. Now improved adherence and titration, improving DM diet, limited activity though. Glucose should improve with resolution of chronic ulceration infection  Plan: 1. Continue titrate Lantus AM, start 38u then titrate up by 1u q 3 days if fasting in AM >150, max dose up to about 50u then notify us if needed 2. Given handouts for recording CBG, bring to next visit. Advised it is difficult to adjust insulin or other meds if do not know good report on sugars.  3. Future A1c, check in 2 months and follow-up for DM f/u

## 2016-03-11 NOTE — Patient Instructions (Signed)
Thank you for coming in to clinic today.  1. Start increasing Lantus insulin dose gradually - Go up by 1 additional unit every 3 days if your morning fasting blood sugar is >150 - Start with 38 units tomorrow, then wait 3 days and go up by 1 as advised - Continue Metformin  Recommend improved diabetic diet with lower carbs or starchy foods.  Diet Recommendations for Diabetes   AVOID Starchy (carb) foods include: Bread, rice, pasta, potatoes, corn, crackers, bagels, muffins, all baked goods.   Protein foods include: Meat, fish, poultry, eggs, dairy foods, and beans such as pinto and kidney beans (beans also provide carbohydrate).   1. Eat at least 3 meals and 1-2 snacks per day. Never go more than 4-5 hours while awake without eating.   2. Limit starchy foods to TWO per meal and ONE per snack. ONE portion of a starchy  food is equal to the following:   - ONE slice of bread (or its equivalent, such as half of a hamburger bun).   - 1/2 cup of a "scoopable" starchy food such as potatoes or rice.   - 1 OUNCE (28 grams) of starchy snacks (crackers or pretzels, look on label).   - 15 grams of carbohydrate as shown on food label.   3. Both lunch and dinner should include a protein food, a carb food, and vegetables.   - Obtain twice as many veg's as protein or carbohydrate foods for both lunch and dinner.   - Try to keep frozen veg's on hand for a quick vegetable serving.     - Fresh or frozen veg's are best.   4. Breakfast should always include protein.    You will be due for Non fasting BLOOD WORK  - Please go ahead and schedule a "Lab Only" visit in the morning at the clinic for lab draw in 2 months for A1c test  Please schedule a follow-up appointment with Dr. Parks Ranger in 2 months Diabetes (1 week AFTER lab test)  If you have any other questions or concerns, please feel free to call the clinic or send a message through [place]. You may also schedule an earlier appointment if  necessary.  [name], DO [place]

## 2016-03-11 NOTE — Progress Notes (Signed)
Subjective:    Patient ID: [name], female    DOB: [birth year], 50 y.o.   MRN: [id number]  [name] is a 50 y.o. female presenting on 03/11/2016 for Toe Pain (FOLLOW UP SURGERY DONE BONE HAS TAKEN OUT FROM INFECTED AREA)  Hospital follow-up from 10/25 to 10/28  HPI  CHRONIC DM, Type 2 - Uncontrolled with neuropathy / Chronic DM Foot Ulceration - Last visit with me 02/24/16 with acute worsening DM ulcer, sent to hospital and admitted for further management of hyperglycemia, DM, infected DM ulcer R toe, s/p podiatry surgery for R 2nd toe debridement arthroplasty PIP. Last saw Podiatry outpatient 10/31 and 11/7, doing well in follow-up sutures have been removed, and next follow-up is 11/21, she is finishing her antibiotic course with Augmentin, based on wound cultures. - Today reports doing well overall since hospitalization. Resolved fevers and chills no longer feels sick. She has continued to take insulin regularly, no other medication changes. She has increased insulin Lantus from 22 up to 37 units daily 0900. Stopped increasing it because she didn't want to drop her sugar too low. CBG readings daily fasting and sometimes evening, never < 200, even fasting AM. Avg 230, highest 300. Also taking Metformin 1000mg  BID - Trying to cut back on soda now occasional diet soda, reduced sweets, does eat sandwiches - Tries to stay hydrated, drinks some diet soda - Chronic peripheral neuropathy without any pain or sensation in Right foot - Denies hypoglycemia, syncope, headache, abdominal pain, chest pain, dyspnea    Social History  Substance Use Topics  . Smoking status: Former Smoker    Packs/day: 3.00    Years: 30.00    Quit date: 05/02/2009  . Smokeless tobacco: Never Used  . Alcohol use No    Review of Systems Per HPI unless specifically indicated above     Objective:    BP 122/84   Pulse (!) 101   Temp 98.2 F (36.8 C) (Oral)   Resp 16   Ht 5' 10.5" (1.791 m)    Wt 239 lb 9.6 oz (108.7 kg)   BMI 33.89 kg/m   Wt Readings from Last 3 Encounters:  03/11/16 239 lb 9.6 oz (108.7 kg)  02/26/16 235 lb (106.6 kg)  02/24/16 235 lb (106.6 kg)    Physical Exam  Constitutional: She appears well-developed and well-nourished. No distress.  Obese, chronically ill but currently comfortable and cooperative  HENT:  Head: Normocephalic and atraumatic.  Cardiovascular: Regular rhythm, normal heart sounds and intact distal pulses.   No murmur heard. Tachycardia  Pulmonary/Chest: Effort normal.  Musculoskeletal: She exhibits no tenderness.  Neurological: She is alert.  R foot without intact sensation due to neuropathy.  Skin: Skin is warm and dry. She is not diaphoretic. There is erythema (Improved RLE foot).  Right foot Did not remove dressing today, recently checked by podiatry 3 days ago, patient requested to leave dressing intact.  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  Nursing note and vitals reviewed.    Results for orders placed or performed during the hospital encounter of 02/24/16  Blood culture (routine x 2)  Result Value Ref Range   Specimen Description BLOOD RIGHT FATTY CASTS    Special Requests BOTTLES DRAWN AEROBIC AND ANAEROBIC [id number]    Culture  Setup Time      Organism ID to follow GRAM POSITIVE COCCI AEROBIC BOTTLE ONLY CRITICAL RESULT CALLED TO, READ  BACK BY AND VERIFIED WITH: CHRISTINE KATSOUDAS 02/25/16 0912 SGD    Culture (A)     GROUP B STREP(S.AGALACTIAE)ISOLATED TESTING AGAINST S. AGALACTIAE NOT ROUTINELY PERFORMED DUE TO PREDICTABILITY OF AMP/PEN/VAN SUSCEPTIBILITY. Virtually 100% of S. agalactiae (Group B) strains are susceptible to Penicillin.  For Penicillin-allergic patients, Erythromycin (85-95% sensitive) and Clindamycin (80% sensitive) are drugs of choice. Contact microbiology lab to request sensitivities if  needed within 7 days. Performed at  [facility]    Report Status 02/27/2016 FINAL    Organism ID, Bacteria GROUP B STREP(S.AGALACTIAE)ISOLATED       Susceptibility   Group b strep(s.agalactiae)isolated - MIC*    CLINDAMYCIN >=1 RESISTANT Resistant     AMPICILLIN <=0.25 SENSITIVE Sensitive     ERYTHROMYCIN >=8 RESISTANT Resistant     VANCOMYCIN 0.5 SENSITIVE Sensitive     CEFTRIAXONE <=0.12 SENSITIVE Sensitive     LEVOFLOXACIN 1 SENSITIVE Sensitive     * GROUP B STREP(S.AGALACTIAE)ISOLATED  Blood culture (routine x 2)  Result Value Ref Range   Specimen Description BLOOD RIGHT ASSIST CONTROL    Special Requests BOTTLES DRAWN AEROBIC AND ANAEROBIC [place]    Culture NO GROWTH 5 DAYS    Report Status 02/29/2016 FINAL   Urine culture  Result Value Ref Range   Specimen Description URINE, CLEAN CATCH    Special Requests Immunocompromised    Culture (A)     <10,000 COLONIES/mL INSIGNIFICANT GROWTH Performed at [facility]    Report Status 02/26/2016 FINAL   Blood Culture ID Panel (Reflexed)  Result Value Ref Range   Enterococcus species NOT DETECTED NOT DETECTED   Listeria monocytogenes NOT DETECTED NOT DETECTED   Staphylococcus species NOT DETECTED NOT DETECTED   Staphylococcus aureus NOT DETECTED NOT DETECTED   Streptococcus species DETECTED (A) NOT DETECTED   Streptococcus agalactiae DETECTED (A) NOT DETECTED   Streptococcus pneumoniae NOT DETECTED NOT DETECTED   Streptococcus pyogenes NOT DETECTED NOT DETECTED   Acinetobacter baumannii NOT DETECTED NOT DETECTED   Enterobacteriaceae species NOT DETECTED NOT DETECTED   Enterobacter cloacae complex NOT DETECTED NOT DETECTED   Escherichia coli NOT DETECTED NOT DETECTED   Klebsiella oxytoca NOT DETECTED NOT DETECTED   Klebsiella pneumoniae NOT DETECTED NOT DETECTED   Proteus species NOT DETECTED NOT DETECTED   Serratia marcescens NOT DETECTED NOT DETECTED   Haemophilus influenzae NOT DETECTED NOT DETECTED   Neisseria meningitidis NOT  DETECTED NOT DETECTED   Pseudomonas aeruginosa NOT DETECTED NOT DETECTED   Candida albicans NOT DETECTED NOT DETECTED   Candida glabrata NOT DETECTED NOT DETECTED   Candida krusei NOT DETECTED NOT DETECTED   Candida parapsilosis NOT DETECTED NOT DETECTED   Candida tropicalis NOT DETECTED NOT DETECTED  MRSA PCR Screening  Result Value Ref Range   MRSA by PCR NEGATIVE NEGATIVE  CBC with Differential  Result Value Ref Range   WBC 6.3 3.6 - 11.0 K/uL   RBC 4.74 3.80 - 5.20 MIL/uL   Hemoglobin 14.2 12.0 - 16.0 g/dL   HCT 42.8 35.0 - 47.0 %   MCV 90.2 80.0 - 100.0 fL   MCH 29.9 26.0 - 34.0 pg   MCHC 33.1 32.0 - 36.0 g/dL   RDW 16.0 (H) 11.5 - 14.5 %   Platelets 236 150 - 440 K/uL   Neutrophils Relative % 61 %   Neutro Abs 3.8 1.4 - 6.5 K/uL   Lymphocytes Relative 29 %   Lymphs Abs 1.8 1.0 - 3.6 K/uL   Monocytes Relative 9 %  Monocytes Absolute 0.6 0.2 - 0.9 K/uL   Eosinophils Relative 0 %   Eosinophils Absolute 0.0 0 - 0.7 K/uL   Basophils Relative 1 %   Basophils Absolute 0.1 0 - 0.1 K/uL  Comprehensive metabolic panel  Result Value Ref Range   Sodium 125 (L) 135 - 145 mmol/L   Potassium 4.4 3.5 - 5.1 mmol/L   Chloride 93 (L) 101 - 111 mmol/L   CO2 21 (L) 22 - 32 mmol/L   Glucose, Bld 698 (HH) 65 - 99 mg/dL   BUN 15 6 - 20 mg/dL   Creatinine, Ser 0.96 0.44 - 1.00 mg/dL   Calcium 9.1 8.9 - 10.3 mg/dL   Total Protein 7.5 6.5 - 8.1 g/dL   Albumin 3.7 3.5 - 5.0 g/dL   AST 31 15 - 41 U/L   ALT 28 14 - 54 U/L   Alkaline Phosphatase 114 38 - 126 U/L   Total Bilirubin 0.7 0.3 - 1.2 mg/dL   GFR calc non Af Amer >60 >60 mL/min   GFR calc Af Amer >60 >60 mL/min   Anion gap 11 5 - 15  Urinalysis complete, with microscopic ([facility] only)  Result Value Ref Range   Color, Urine STRAW (A) YELLOW   APPearance CLEAR (A) CLEAR   Glucose, UA >500 (A) NEGATIVE mg/dL   Bilirubin Urine NEGATIVE NEGATIVE   Ketones, ur NEGATIVE NEGATIVE mg/dL   Specific Gravity, Urine 1.029 1.005 - 1.030    Hgb urine dipstick NEGATIVE NEGATIVE   pH 5.0 5.0 - 8.0   Protein, ur NEGATIVE NEGATIVE mg/dL   Nitrite NEGATIVE NEGATIVE   Leukocytes, UA NEGATIVE NEGATIVE   RBC / HPF 0-5 0 - 5 RBC/hpf   WBC, UA 0-5 0 - 5 WBC/hpf   Bacteria, UA NONE SEEN NONE SEEN   Squamous Epithelial / LPF 0-5 (A) NONE SEEN  Sedimentation rate  Result Value Ref Range   Sed Rate 53 (H) 0 - 30 mm/hr  C-reactive protein  Result Value Ref Range   CRP 5.0 (H) <1.0 mg/dL  Glucose, capillary  Result Value Ref Range   Glucose-Capillary 421 (H) 65 - 99 mg/dL  Lipid panel  Result Value Ref Range   Cholesterol 184 0 - 200 mg/dL   Triglycerides 263 (H) <150 mg/dL   HDL 29 (L) >40 mg/dL   Total CHOL/HDL Ratio 6.3 RATIO   VLDL 53 (H) 0 - 40 mg/dL   LDL Cholesterol 102 (H) 0 - 99 mg/dL  Basic metabolic panel  Result Value Ref Range   Sodium 136 135 - 145 mmol/L   Potassium 4.1 3.5 - 5.1 mmol/L   Chloride 106 101 - 111 mmol/L   CO2 24 22 - 32 mmol/L   Glucose, Bld 291 (H) 65 - 99 mg/dL   BUN 13 6 - 20 mg/dL   Creatinine, Ser 0.67 0.44 - 1.00 mg/dL   Calcium 8.5 (L) 8.9 - 10.3 mg/dL   GFR calc non Af Amer >60 >60 mL/min   GFR calc Af Amer >60 >60 mL/min   Anion gap 6 5 - 15  CBC  Result Value Ref Range   WBC 6.0 3.6 - 11.0 K/uL   RBC 3.99 3.80 - 5.20 MIL/uL   Hemoglobin 11.9 (L) 12.0 - 16.0 g/dL   HCT 35.5 35.0 - 47.0 %   MCV 88.9 80.0 - 100.0 fL   MCH 29.9 26.0 - 34.0 pg   MCHC 33.6 32.0 - 36.0 g/dL   RDW 16.2 (H) 11.5 -  14.5 %   Platelets 195 150 - 440 K/uL  Magnesium  Result Value Ref Range   Magnesium 1.8 1.7 - 2.4 mg/dL  TSH  Result Value Ref Range   TSH 1.471 0.350 - 4.500 uIU/mL  Glucose, capillary  Result Value Ref Range   Glucose-Capillary 358 (H) 65 - 99 mg/dL   Comment 1 Notify RN    Comment 2 Repeat Test   Glucose, capillary  Result Value Ref Range   Glucose-Capillary 381 (H) 65 - 99 mg/dL  Glucose, capillary  Result Value Ref Range   Glucose-Capillary 293 (H) 65 - 99 mg/dL  Glucose,  capillary  Result Value Ref Range   Glucose-Capillary 347 (H) 65 - 99 mg/dL  Glucose, capillary  Result Value Ref Range   Glucose-Capillary 270 (H) 65 - 99 mg/dL  Glucose, capillary  Result Value Ref Range   Glucose-Capillary 334 (H) 65 - 99 mg/dL  Glucose, capillary  Result Value Ref Range   Glucose-Capillary 256 (H) 65 - 99 mg/dL  Glucose, capillary  Result Value Ref Range   Glucose-Capillary 256 (H) 65 - 99 mg/dL  Glucose, capillary  Result Value Ref Range   Glucose-Capillary 219 (H) 65 - 99 mg/dL  Glucose, capillary  Result Value Ref Range   Glucose-Capillary 192 (H) 65 - 99 mg/dL  Glucose, capillary  Result Value Ref Range   Glucose-Capillary 161 (H) 65 - 99 mg/dL  Glucose, capillary  Result Value Ref Range   Glucose-Capillary 255 (H) 65 - 99 mg/dL  CBC with Differential/Platelet  Result Value Ref Range   WBC 7.0 3.6 - 11.0 K/uL   RBC 3.94 3.80 - 5.20 MIL/uL   Hemoglobin 11.7 (L) 12.0 - 16.0 g/dL   HCT 34.8 (L) 35.0 - 47.0 %   MCV 88.1 80.0 - 100.0 fL   MCH 29.6 26.0 - 34.0 pg   MCHC 33.6 32.0 - 36.0 g/dL   RDW 15.3 (H) 11.5 - 14.5 %   Platelets 203 150 - 440 K/uL   Neutrophils Relative % 50 %   Neutro Abs 3.5 1.4 - 6.5 K/uL   Lymphocytes Relative 38 %   Lymphs Abs 2.7 1.0 - 3.6 K/uL   Monocytes Relative 8 %   Monocytes Absolute 0.5 0.2 - 0.9 K/uL   Eosinophils Relative 3 %   Eosinophils Absolute 0.2 0 - 0.7 K/uL   Basophils Relative 1 %   Basophils Absolute 0.0 0 - 0.1 K/uL  Basic metabolic panel  Result Value Ref Range   Sodium 137 135 - 145 mmol/L   Potassium 3.9 3.5 - 5.1 mmol/L   Chloride 109 101 - 111 mmol/L   CO2 22 22 - 32 mmol/L   Glucose, Bld 234 (H) 65 - 99 mg/dL   BUN 8 6 - 20 mg/dL   Creatinine, Ser 0.65 0.44 - 1.00 mg/dL   Calcium 8.6 (L) 8.9 - 10.3 mg/dL   GFR calc non Af Amer >60 >60 mL/min   GFR calc Af Amer >60 >60 mL/min   Anion gap 6 5 - 15  Glucose, capillary  Result Value Ref Range   Glucose-Capillary 229 (H) 65 - 99 mg/dL    Glucose, capillary  Result Value Ref Range   Glucose-Capillary 218 (H) 65 - 99 mg/dL   Comment 1 Notify RN   Surgical pathology  Result Value Ref Range   SURGICAL PATHOLOGY      Surgical Pathology CASE: [id number] PATIENT: Fantasha Sawaya Surgical Pathology Report     SPECIMEN SUBMITTED: A.  Bone, 2nd toe  CLINICAL HISTORY: None provided  PRE-OPERATIVE DIAGNOSIS: Right 2nd toe  POST-OPERATIVE DIAGNOSIS: Same as pre-op     DIAGNOSIS: A. BONE, RIGHT SECOND TOE; DEBRIDEMENT: - SOFT TISSUE WITH ACUTE INFLAMMATION. - UNREMARKABLE BONE.   GROSS DESCRIPTION: A. Labeled: Bone from second toe  Tissue fragment(s): 2  Size: 1.7 x 1.2 x 0.5 cm  Description: Pink red unremarkable bone fragments  Representative sections submitted in one cassette(s), after decalcification.     Final Diagnosis performed by Delorse Lek, MD.  Electronically signed 02/29/2016 12:47:32PM    The electronic signature indicates that the named Attending Pathologist has evaluated the specimen  Technical component performed at [facility], [address], [place], [place] [address] Lab: [phone] Dir: Darrick Penna. Evette Doffing, MD  Professional component pe rformed at [facility], [facility], [place], [place],  [address] Lab: [phone] Dir: Dellia Nims. Rubinas, MD        Assessment & Plan:   Problem List Items Addressed This Visit    DM (diabetes mellitus), type 2, 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[place]) - Primary    Uncontrolled DM2, last A1c >14% likely due to prior non adherence with insulin. Now improved adherence and titration, improving DM diet, limited activity though. Glucose should improve with resolution of chronic ulceration infection  Plan: 1. Continue titrate Lantus AM, start 38u then titrate up by 1u q 3 days if fasting in AM >150, max dose up to about 50u then notify us if needed 2. Given handouts for recording CBG, bring to next visit.  Advised it is difficult to adjust insulin or other meds if do not know good report on sugars.  3. Future A1c, check in 2 months and follow-up for DM f/u      Relevant Orders   Hemoglobin A1c   Diabetic foot ulcer ([place])    Improved s/p podiatry debridement, antibiotics in hospitalization and completing Augmentin outpatient 12 day course. Resolved systemic symptoms. Concern with chronic hammer toes and complicated by uncontrolled DM, at risk of worsening in future. - Continue follow-up with Podiatry as scheduled 11/21         No orders of the defined types were placed in this encounter.     Follow up plan: Return in about 2 months (around 05/11/2016) for diabetes.  [name], DO [facility] 03/11/2016, 1:16 PM

## 2016-03-11 NOTE — Assessment & Plan Note (Signed)
Improved s/p podiatry debridement, antibiotics in hospitalization and completing Augmentin outpatient 12 day course. Resolved systemic symptoms. Concern with chronic hammer toes and complicated by uncontrolled DM, at risk of worsening in future. - Continue follow-up with Podiatry as scheduled 11/21

## 2016-04-20 ENCOUNTER — Encounter: Payer: Self-pay | Admitting: Family Medicine

## 2016-04-20 ENCOUNTER — Ambulatory Visit (INDEPENDENT_AMBULATORY_CARE_PROVIDER_SITE_OTHER): Payer: Medicare Other | Admitting: Family Medicine

## 2016-04-20 DIAGNOSIS — M2041 Other hammer toe(s) (acquired), right foot: Secondary | ICD-10-CM

## 2016-04-20 NOTE — Assessment & Plan Note (Signed)
Stable chronic post-op R 2nd hammer toe with PIP deformity bulkiness but no evidence of new ulceration or infection, no cellulitis or edema. Complicated by chronic DM neuropathy. - S/p R toe debridement and PIP arthroplasty 10/27  Plan: 1. Reassurance today, unlikely infection or other acute abnormality, continue current routine foot care 2. Continue DM management 3. Follow-up as scheduled with Podiatry 05/12/16 4. Follow-up with me as planned 4-6 weeks for DM, A1c

## 2016-04-20 NOTE — Patient Instructions (Signed)
Thank you for coming in to clinic today.  1. Reassurance today. I do not see any evidence of toe infection in your Right foot. This looks like a change after your procedure. Toe has good blood flow and there is no swelling in it, I feel more of the bony prominence.  2. Continue managing your diabetes and controlling blood sugars  3. Follow-up as planned with Podiatry Dr Vickki Muff on 05/12/16  Please schedule a follow-up appointment with Dr. Parks Ranger as scheduled  Within next 4-6 weeks for Diabetes check  If you have any other questions or concerns, please feel free to call the clinic or send a message through [place]. You may also schedule an earlier appointment if necessary.  [name], DO [place]

## 2016-04-20 NOTE — Progress Notes (Signed)
Subjective:    Patient ID: [name], female    DOB: October 13, 1965, 50 y.o.   MRN: [id number]  [name] is a 50 y.o. female presenting on 04/20/2016 for Foot Pain (as per pt color change as compare to other foot onset today)  Patient presents for a same day appointment.  HPI  HAMMER TOE, RIGHT 2nd TOE / CHRONIC DM with Neuropathy / H/o FOOT ULCERATION: - Last visit with me 03/11/16 for Diabetes, she has had significant recent course with infected DM toe ulcer, and has been hospitalized also established with Podiatry [facility], Dr Vickki Muff), she has had prior procedure on 10/27 for R 2nd toe debridement arthroplasty PIP, see prior notes for details. - Today she presents for re-evaluation of her Right 2nd toe, with concerns of some skin color change seems more red by report, and wanted to get it checked out. Seems to have changed within past few days, but not getting worse. Her next appointment with Podiatry is not until 05/12/16. - Admits to chronic thick abnormal toenail changes on both feet, chronic numbness with neuropathy R foot - Denies any fevers/chills, spreading redness, swelling of toe or foot, new ulceration, drainage of pus, injury    Social History  Substance Use Topics  . Smoking status: Former Smoker    Packs/day: 3.00    Years: 30.00    Quit date: 05/02/2009  . Smokeless tobacco: Never Used  . Alcohol use No    Review of Systems Per HPI unless specifically indicated above     Objective:    BP 127/79   Pulse (!) 102   Temp 98.2 F (36.8 C) (Oral)   Resp 16   Ht 5' 10.5" (1.791 m)   Wt 239 lb (108.4 kg)   BMI 33.81 kg/m   Wt Readings from Last 3 Encounters:  04/20/16 239 lb (108.4 kg)  03/11/16 239 lb 9.6 oz (108.7 kg)  02/26/16 235 lb (106.6 kg)    Physical Exam  Constitutional: She appears well-developed and well-nourished. No distress.  Obese, chronically ill but currently comfortable and cooperative  HENT:  Head: Normocephalic  and atraumatic.  Cardiovascular: Regular rhythm, normal heart sounds and intact distal pulses.   No murmur heard. Tachycardia  Pulmonary/Chest: Effort normal.  Musculoskeletal: She exhibits no tenderness.  Neurological: She is alert.  R foot without intact sensation due to neuropathy.  Skin: Skin is warm and dry. No rash noted. She is not diaphoretic. No erythema.  Right foot 2nd toe with stable bulky appearance from recent arthroplasty with bony prominence of PIP joint without new ulceration, swelling, warmth, tenderness, no drainage. Some mild increased color compared to other toes but blanches easily and not consistent with actual erythema. No fluctuance or induration. Moves toes normal. 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  Nursing note and vitals reviewed.          Assessment & Plan:   Problem List Items Addressed This Visit    Hammer toe of second toe of right foot    Stable chronic post-op R 2nd hammer toe with PIP deformity bulkiness but no evidence of new ulceration or infection, no cellulitis or edema. Complicated by chronic DM neuropathy. - S/p R toe debridement and PIP arthroplasty 10/27  Plan: 1. Reassurance today, unlikely infection or other acute abnormality, continue current routine foot care 2. Continue DM management 3. Follow-up as scheduled with Podiatry  05/12/16 4. Follow-up with me as planned 4-6 weeks for DM, A1c         No orders of the defined types were placed in this encounter.     Follow up plan: Return in about 6 weeks (around 06/01/2016) for diabetes.  [name], [facility] 04/20/2016, 4:48 PM

## 2016-06-01 ENCOUNTER — Ambulatory Visit: Payer: Medicare Other | Admitting: Family Medicine

## 2016-06-23 ENCOUNTER — Other Ambulatory Visit: Payer: Self-pay | Admitting: *Deleted

## 2016-06-23 NOTE — Patient Outreach (Signed)
[place] [facility]) Care Management  06/23/2016  JENAVIE STANCZAK 1965/11/30 [id number]  Nurse Advice Line referral-patient states she could not remember if she has taken insulin shot twice; suppose to take one time:  Telephone call to patient; ringing without answer-unable to leave message.  Plan: Will follow up. Sherrin Daisy, RN BSN [place] Management Coordinator [facility] Care Management  [phone]

## 2016-06-24 ENCOUNTER — Other Ambulatory Visit: Payer: Self-pay | Admitting: *Deleted

## 2016-06-24 NOTE — Patient Outreach (Signed)
[place] [facility]) Care Management  06/24/2016  TARAYA STEWARD 22-Mar-1966 [id number]   Referral via Nurse advice Line:( patient thinks she may have taken PM dose of insulin-Lantus-Solastar 38 units twice but can't remember-states BS was 357 and 30 minutes later BS 207 on 06/21/16).   Telephone call  to patient who was advised of reason for call. HIPPA verification received.   Patient voices that she is checking blood sugar 2-3 times daily. States it ranges from 327-450 and seems to get higher after taking insulin. . States last " A1C level was 11.0. Patient voices that she has not called primary care office since having high blood sugars and questionably giving her self 2 doses of PM on lantus insulin on 06/21/16. Patient was advised to call her primary office today to advise of blood sugar levels and get medical advice & set appointment..  Call discontinued so patient could call primary office before closing.   Plan;  Will follow up.  Sherrin Daisy, RN BSN [place] Management Coordinator [facility] Care Management  [phone]

## 2016-06-27 ENCOUNTER — Encounter: Payer: Self-pay | Admitting: Family Medicine

## 2016-06-27 ENCOUNTER — Ambulatory Visit (INDEPENDENT_AMBULATORY_CARE_PROVIDER_SITE_OTHER): Payer: Medicare Other | Admitting: Family Medicine

## 2016-06-27 VITALS — BP 115/79 | HR 97 | Temp 98.4°F | Resp 16 | Ht 70.5 in | Wt 239.0 lb

## 2016-06-27 DIAGNOSIS — Z794 Long term (current) use of insulin: Secondary | ICD-10-CM

## 2016-06-27 DIAGNOSIS — G629 Polyneuropathy, unspecified: Secondary | ICD-10-CM

## 2016-06-27 DIAGNOSIS — E1142 Type 2 diabetes mellitus with diabetic polyneuropathy: Secondary | ICD-10-CM

## 2016-06-27 DIAGNOSIS — E1165 Type 2 diabetes mellitus with hyperglycemia: Secondary | ICD-10-CM | POA: Diagnosis not present

## 2016-06-27 DIAGNOSIS — IMO0002 Reserved for concepts with insufficient information to code with codable children: Secondary | ICD-10-CM

## 2016-06-27 DIAGNOSIS — E1169 Type 2 diabetes mellitus with other specified complication: Secondary | ICD-10-CM | POA: Diagnosis not present

## 2016-06-27 DIAGNOSIS — E785 Hyperlipidemia, unspecified: Secondary | ICD-10-CM

## 2016-06-27 LAB — POCT GLYCOSYLATED HEMOGLOBIN (HGB A1C): Hemoglobin A1C: >14

## 2016-06-27 MED ORDER — GLUCOSE BLOOD VI STRP: ORAL_STRIP | 11 refills | Status: DC

## 2016-06-27 MED ORDER — DULAGLUTIDE 0.75 MG/0.5ML ~~LOC~~ SOAJ: 0.7500 mg | SUBCUTANEOUS | 2 refills | Status: DC

## 2016-06-27 MED ORDER — INSULIN GLARGINE 100 UNIT/ML SOLOSTAR PEN: 40.0000 [IU] | PEN_INJECTOR | Freq: Every day | SUBCUTANEOUS | 11 refills | Status: DC

## 2016-06-27 MED ORDER — ATORVASTATIN CALCIUM 40 MG PO TABS
40.0000 mg | ORAL_TABLET | Freq: Every day | ORAL | 3 refills | Status: DC
Start: 2016-06-27 — End: 2017-01-18

## 2016-06-27 NOTE — Progress Notes (Addendum)
Subjective:    Patient ID: [name], female    DOB: 19-Mar-1966, 51 y.o.   MRN: [id number]  [name] is a 51 y.o. female presenting on 06/27/2016 for Diabetes (BS goes up 350 fasting and by afternoon 450 and at bed time 500)  HPI  CHRONIC DM, Type 2 - Uncontrolled with neuropathy / H/o Chronic DM Foot Ulceration - Last visit for diabetes in 03/2016 about 3 months ago, since then patient was advised to improve DM diet and start increasing Lantus based on titration - Today patient follows up, she was unable to come in sooner due to transportation issues, A1c still >14% unchanged, she attributes this largely to admitting poor lifestyle habits and mainly non adherence to medication. Describes skipping medicine and not motivated to take it, often forgets, and just "needs to do better", states she needs a fixed schedule or reminders to take meds, lives with her mother but does not always remind her. She is now more motivated after visiting grandchildren and wants to control her diabetes better now. - Has glucometer (Prodigty - reviewed CBG ranging from 300 to 500+, various times of day, not everyday) does not have written CBG log, was given handout before but did not complete, request test strips today - Taking Lantus 38u daily in AM (onlyl actually receiving about 2-3 doses a week) - Taking Metformin 1000mg  once daily, taking about 2-3x weekly, did have some diarrhea and upset stomach on this - Lifestyle:     - Previously diet had improved with off of regular sodas, but now back on some regular sodas, drinks some water, otherwise not following DM diet, she requests referral to [place] - Has not had recent eye exam, request new referral to return to [facility] - PMH complication with DM neuropathy and chronic DM foot ulcer - Denies hypoglycemia, syncope, headache, abdominal pain, chest pain, dyspnea  HLD in setting of DM2 - Last lipids 01/2016, had  improved on statin, still elevated TG - Now poor diet and exercise, infrequent use atrovastatin, has been out, request refill, tolerated previously without myalgias    Social History  Substance Use Topics  . Smoking status: Former Smoker    Packs/day: 3.00    Years: 30.00    Quit date: 05/02/2009  . Smokeless tobacco: Never Used  . Alcohol use No    Review of Systems Per HPI unless specifically indicated above     Objective:    BP 115/79   Pulse 97   Temp 98.4 F (36.9 C) (Oral)   Resp 16   Ht 5' 10.5" (1.791 m)   Wt 239 lb (108.4 kg)   BMI 33.81 kg/m   Wt Readings from Last 3 Encounters:  06/27/16 239 lb (108.4 kg)  04/20/16 239 lb (108.4 kg)  03/11/16 239 lb 9.6 oz (108.7 kg)    Physical Exam  Constitutional: She appears well-developed and well-nourished. No distress.  Obese, chronically ill but currently comfortable and cooperative  HENT:  Head: Normocephalic and atraumatic.  Mouth/Throat: Oropharynx is clear and moist.  Eyes: Conjunctivae are normal.  Neck: Normal range of motion. Neck supple.  Cardiovascular: Normal rate, regular rhythm, normal heart sounds and intact distal pulses.   No murmur heard. Pulmonary/Chest: Effort normal.  Musculoskeletal: She exhibits no edema or tenderness.  Neurological: She is alert.  Skin: Skin is warm and dry. No rash noted. She is not diaphoretic. No erythema.  Psychiatric: She has a normal mood and affect.  Her behavior is normal.  Well groomed, good eye contact, normal speech and thoughts  Nursing note and vitals reviewed.    Results for orders placed or performed in visit on 06/27/16  POCT HgB A1C  Result Value Ref Range   Hemoglobin A1C >14       Assessment & Plan:   Problem List Items Addressed This Visit    Polyneuropathy ([place])    Chronoic problem, secondary to worsening uncontrolled DM with complications. On Gabapentin, seems poor control Will need to control CBG first, future consider dose titration vs  lyrica      Long term current use of insulin (HCC)    Diabetes, on lantus, see A&P      Hyperlipidemia associated with type 2 diabetes mellitus (HCC)    Refilled Atorvastatin 40mg  at night, #90, +3 refills      Relevant Medications   Dulaglutide (TRULICITY) 4.40 HK/7.4QV SOPN   atorvastatin (LIPITOR) 40 MG tablet   Insulin Glargine (BASAGLAR KWIKPEN) 100 UNIT/ML SOPN   DM (diabetes mellitus), type 2, 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[place]) - Primary    Remains severely uncontrolled DM2, A1c remains >14% over past 3+ months, still due to non adherence with meds (insulin and metformin), now some worsening lifestyle poor DM diet, has resumed regular sodas. - Complication with DM neuropathy and chronic foot ulceration  Plan: 1. Extensive discussion today on importance of adherence to meds / lifestyle, otherwise impossible to control DM, will lead to worsening complications 2. Refilled Lantus, start at 40u daily in AM, needs to get reminder / calendar or med alarm to take med daily, did not review titration instructions again today, will start with adherence first 3. Continue Metformin 1000mg  - start only daily PM with dinner for 1-2 weeks, if tolerated then switch to BID, refilled 4. Start new medication GLP1 - prefer option for weekly dosing given non adherence, written for Trulicity (Dulaglutide) 0.75mg  weekly sq injection #4 pens + refills, demo at pharmacy, counseled on potential side effect nausea, likely need higher dose in future, may need further authorization 5. Given handouts for recording CBG, bring to next visit. Advised it is difficult to adjust insulin without accurate readings 6. Referral to [facility] for DM eye, referral to [place] for DM education 7. Regarding DM shoes, patient to be notified by nursing staff that she is to check med supply store, obtain forms / order request and bring to [place] at next apt 8. Follow-up 6 weeks review CBG logs, discuss meds, next  visit 3 mo from now for DM A1c - in future if not making significant improvement, can consider endocrinology referral, also consider referral to [facility] for medication assistance / education vs [facility] 06/28/16 - received fax from [place] request change from Lantus insulin to [place] (insulin glargine), no dosage change, now covered Tier 2, will send new rx to pharmacy, nursing staff to notify patient of name change, she has 1 temporary rx of Lantus available currently      Relevant Medications   Dulaglutide (TRULICITY) 9.56 LO/7.5IE SOPN   glucose blood test strip   atorvastatin (LIPITOR) 40 MG tablet   Insulin Glargine (BASAGLAR KWIKPEN) 100 UNIT/ML SOPN   Other Relevant Orders   POCT HgB A1C (Completed)   Ambulatory referral to Ophthalmology   Ambulatory referral to diabetic education      Meds ordered this encounter  Medications  . Dulaglutide (TRULICITY) 3.32 RJ/1.8AC SOPN    Sig: Inject 0.75  mg into the skin once a week.    Dispense:  4 pen    Refill:  2  . glucose blood test strip    Sig: Check blood sugar up to 3 times daily    Dispense:  100 each    Refill:  11    Prodigy meter  . DISCONTD: Insulin Glargine (LANTUS) 100 UNIT/ML Solostar Pen    Sig: Inject 40 Units into the skin daily. In morning, adjust dose as advised    Dispense:  15 mL    Refill:  11  . atorvastatin (LIPITOR) 40 MG tablet    Sig: Take 1 tablet (40 mg total) by mouth at bedtime.    Dispense:  90 tablet    Refill:  3  . Insulin Glargine (BASAGLAR KWIKPEN) 100 UNIT/ML SOPN    Sig: Inject 0.4 mLs (40 Units total) into the skin daily. May adjust dose as advised. Take once daily in morning.    Dispense:  15 pen    Refill:  3    Switch from Lantus to [employer] per [employer] preferred med, please notify patient of new med change, 90 day supply      Follow up plan: Return in about 6 weeks (around 08/08/2016) for diabetes.  [name], [facility] 06/28/2016, 1:42 PM

## 2016-06-27 NOTE — Assessment & Plan Note (Addendum)
Remains severely uncontrolled DM2, A1c remains >14% over past 3+ months, still due to non adherence with meds (insulin and metformin), now some worsening lifestyle poor DM diet, has resumed regular sodas. - Complication with DM neuropathy and chronic foot ulceration  Plan: 1. Extensive discussion today on importance of adherence to meds / lifestyle, otherwise impossible to control DM, will lead to worsening complications 2. Refilled Lantus, start at 40u daily in AM, needs to get reminder / calendar or med alarm to take med daily, did not review titration instructions again today, will start with adherence first 3. Continue Metformin 1000mg  - start only daily PM with dinner for 1-2 weeks, if tolerated then switch to BID, refilled 4. Start new medication GLP1 - prefer option for weekly dosing given non adherence, written for Trulicity (Dulaglutide) 0.75mg  weekly sq injection #4 pens + refills, demo at pharmacy, counseled on potential side effect nausea, likely need higher dose in future, may need further authorization 5. Given handouts for recording CBG, bring to next visit. Advised it is difficult to adjust insulin without accurate readings 6. Referral to [facility] for DM eye, referral to [place] for DM education 7. Regarding DM shoes, patient to be notified by nursing staff that she is to check med supply store, obtain forms / order request and bring to [place] at next apt 8. Follow-up 6 weeks review CBG logs, discuss meds, next visit 3 mo from now for DM A1c - in future if not making significant improvement, can consider endocrinology referral, also consider referral to [facility] for medication assistance / education vs [facility] 06/28/16 - received fax from [place] request change from Lantus insulin to [place] (insulin glargine), no dosage change, now covered Tier 2, will send new rx to pharmacy, nursing staff to notify patient of name change, she has 1  temporary rx of Lantus available currently

## 2016-06-27 NOTE — Patient Instructions (Signed)
Thank you for coming in to clinic today.  1. DIabetes remains uncontrolled as discussed - Start Lantus 40 units in morning, EVERY day - Start Metformin 1000mg  DAILY (dinner) 1-2 weeks, then if tolerating if you dont have diarrhea, upset stomach, then INCREASE to 1 pill TWICE a day with food (breakfast, dinner) everyday  - Added new Diabetes injectable medication - Trulicity (Dulaglutide) 0.75mg  WEEKLY injection, MORNINGN WITH FOOD  You may feel a little sick for few days to weeks as you adjust to the lowering blood sugar  Resume Atorvastatin nightly at bedtime, for cholesterol  Medication Alarm Clock - ASK PHARMACIST TO DIRECT YOU TO ONE OF THESE TO HELP TO REMEMBER TO TAKE YOUR MEDICATIONS ON TIME  FOR [facility] DIABETIC EYE [facility]   [address] [place], [place] [address] Phone: [phone]   YOU WILL BE CONTACTED WITH NUTRITION APT  [place]   Address: [address] [place], [place], [place] [address] Phone: [phone]   Please schedule a follow-up appointment with Dr. Parks Ranger in 6 weeks  If you have any other questions or concerns, please feel free to call the clinic or send a message through [place]. You may also schedule an earlier appointment if necessary.  Gabriela Putnam, DO [place]

## 2016-06-27 NOTE — Assessment & Plan Note (Signed)
Refilled Atorvastatin 40mg  at night, #90, +3 refills

## 2016-06-27 NOTE — Assessment & Plan Note (Signed)
Diabetes, on lantus, see A&P

## 2016-06-27 NOTE — Assessment & Plan Note (Signed)
Chronoic problem, secondary to worsening uncontrolled DM with complications. On Gabapentin, seems poor control Will need to control CBG first, future consider dose titration vs lyrica

## 2016-06-28 ENCOUNTER — Telehealth: Payer: Self-pay | Admitting: *Deleted

## 2016-06-28 ENCOUNTER — Other Ambulatory Visit: Payer: Self-pay | Admitting: *Deleted

## 2016-06-28 MED ORDER — BASAGLAR KWIKPEN 100 UNIT/ML ~~LOC~~ SOPN: 40.0000 [IU] | PEN_INJECTOR | Freq: Every day | SUBCUTANEOUS | 3 refills | Status: DC

## 2016-06-28 NOTE — Patient Outreach (Signed)
[facility]) Care Management  06/28/2016  [name] [birth year] [id number]  Telephone call to patient who was advised of [place] management services.  HIPPA verification received from patient.   Patient voices that her major health concern is her diabetes which is out of control. States she went to primary care yesterday 02/26 and "A1c" level was greater than 14.  Patient admits that she has not been taking her medications consistently as prescribed and that she has not been doing the things that need to be doing to keep diabetes under control. States she is motivated to do better now.   Voices that MD advised her how to take medications & has written her more prescriptions that she plans to pick up from pharmacy today. States her blood sugars range from 300 to -greater than 500 in the last several weeks.  States blood sugar was 300 this am before eating. States she took diabetics class 3-4 years ago but needs them again. States primary  care office is going to set up outpatient  diabetes classes for her and will notify her when arranged.   Patient voices that other health conditions include neuropathy of both feet, HTN, Schizophrenia. States she attends appointments with podiatrist (Dr. Vickki Muff), and psychiatrist. Voices that she  drives self to appointments.  Currently 3 family members live in home and assist each other if needed.   Patient voices that she would like assistance from [facility] care management regarding managing her diabetes and medications. States MD is titrating her medications because she has not been taking them properly in several months.    Consents to [employer] care coordinator services.   Plan: refer to care management assistant to assign to [facility] care coordinator for complex case management, chronic disease management & medication management.   Sherrin Daisy, RN BSN [place] Management Coordinator [facility] Care Management  [phone]

## 2016-06-28 NOTE — Telephone Encounter (Signed)
Patient aware.

## 2016-06-28 NOTE — Telephone Encounter (Signed)
Patient notified

## 2016-06-28 NOTE — Addendum Note (Signed)
Addended by: [name] on: 06/28/2016 01:42 PM   Modules accepted: Orders

## 2016-06-28 NOTE — Telephone Encounter (Signed)
-----   Message from [name], DO sent at 06/28/2016  1:42 PM EST ----- Regarding: Notify patient, changed insulin I just saw patient in office yesterday 06/27/16, her Lantus insulin was refilled, however I received a fax today from her Insurance regarding formulary preference change, Lantus is no longer covered, she was able to get one refill from pharmacy (I believe), but she cannot get any more. I have changed her rx to Basaglar Insulin (it is the same medicine, just different name), same dosing, sent 90 day supply with 3 refills good for 1 year.  Please notify her of this change so that she is not confused by new insulin medicine. Thank you

## 2016-06-28 NOTE — Telephone Encounter (Signed)
-----   Message from [name], DO sent at 06/27/2016 12:23 PM EST ----- Regarding: Diabetic Shoes Could you please call patient to notify her that if she would like to get a pair of Diabetic Shoes covered by Medicare, she would need to go to a local Medical Supply Store and request this, and they will give her the appropriate form / paperwork to bring back to [place] to complete, she can bring this to her next apt in 6 weeks or drop it off in advance of that appointment.  Perhaps, provide her with a list or suggestion on local medical supply store.  Thanks

## 2016-06-29 ENCOUNTER — Other Ambulatory Visit: Payer: Self-pay | Admitting: *Deleted

## 2016-06-29 NOTE — Patient Outreach (Signed)
Attempt made to contact pt, follow up on referral from [facility] telephonic RN CM for community nurse case management services (uncontrolled diabetes).  Unable to leave a voice message as phone kept ringing.    Plan:  RN CM to call pt again within next 2 days.    Zara Chess.   [place] Care Management  [phone]

## 2016-06-29 NOTE — Addendum Note (Signed)
Addended by: Dickie La on: 06/29/2016 11:42 AM   Modules accepted: Orders

## 2016-06-30 ENCOUNTER — Other Ambulatory Visit: Payer: Self-pay | Admitting: *Deleted

## 2016-06-30 NOTE — Patient Outreach (Signed)
Second attempt made to contact pt, follow up on referral received from [facility] telephonic RN CM 2/28 for community nurse case management services ( pt has uncontrolled diabetes, recent A1C >14).  Unable to leave a voice message as phone kept ringing.     Plan:  RN CM to make a third attempt within the next 6 days.    Zara Chess.   [place] Care Management  [phone]

## 2016-07-01 ENCOUNTER — Other Ambulatory Visit: Payer: Self-pay | Admitting: Pharmacist

## 2016-07-01 NOTE — Patient Outreach (Signed)
[name] was referred to pharmacy for medication adherence related to her diabetes medications. Phone rings, but no answer. Unable to leave a message as no voicemail picks up. If have not heard from patient by 07/04/16, will give her another call at that time.  Harlow Asa, PharmD, [place] Management [phone]

## 2016-07-04 ENCOUNTER — Ambulatory Visit: Payer: Self-pay | Admitting: Pharmacist

## 2016-07-04 ENCOUNTER — Other Ambulatory Visit: Payer: Self-pay | Admitting: Pharmacist

## 2016-07-04 NOTE — Patient Outreach (Signed)
[name] was referred to pharmacy for medication adherence related to her diabetes medications. Phone rings, but no answer. Outreach attempt #2. Unable to leave a message as no voicemail picks up. If have not heard from patient by 07/06/16, will give her another call at that time.  Harlow Asa, PharmD, [place] Management [phone]

## 2016-07-05 ENCOUNTER — Other Ambulatory Visit: Payer: Self-pay | Admitting: *Deleted

## 2016-07-05 ENCOUNTER — Encounter: Payer: Self-pay | Admitting: *Deleted

## 2016-07-05 NOTE — Patient Outreach (Signed)
Third attempt made to contact pt, follow up on referral from [facility] telephonic RN CM 2/28 for community nurse case management services (pt- uncontrolled diabetes, recent A1C >14).  Unable to leave a voice message as pt's phone kept ringing.    Plan:  Per [facility] policy, with 3 unsuccessful phone attempts, to send an unable to contact letter and if no response within 10 business days, to close case and notify Dr. Parks Ranger of unable to contact.      Zara Chess.   [place] Care Management  [phone]

## 2016-07-06 ENCOUNTER — Other Ambulatory Visit: Payer: Self-pay | Admitting: Pharmacist

## 2016-07-06 ENCOUNTER — Ambulatory Visit: Payer: Self-pay | Admitting: Pharmacist

## 2016-07-06 NOTE — Patient Outreach (Signed)
[name] was referred to pharmacy for medication adherence related to her diabetes medications. Phone rings, but no answer. Outreach attempt #3. Unable to leave a message as no voicemail picks up. Note that RNCM Rose Pierzchala has also attempted to contact the patient and sent a unable to contact letter. Will follow with Rose and if no response within 10 business days, will close pharmacy episode.  Harlow Asa, PharmD, [place] Management [phone]

## 2016-07-13 ENCOUNTER — Telehealth: Payer: Self-pay | Admitting: Family Medicine

## 2016-07-13 NOTE — Telephone Encounter (Signed)
This is difficult to assess without being able to see it in person.  She may start using warm moist compress (warm water soaked wash cloth) and apply this several times a day, or may sit in bathtub for warm tub water to soak it, allow it to drain any pus or help it ooze, which will allow it to start to heal.  If it drains on it's own, it may continue to heal up and resolve without other treatment.  If it does not drain, then she may need an incision & drainage procedure in the office, AND OR she may need antibiotics if spreading redness of the skin, or any fevers/chills, nausea, vomiting, or other general sick symptoms caused by this spot.  She may start warm water soaks at home first, and if not improving may schedule to see me later this week, or she may go to Urgent Care, one good option is [facility] for further evaluation of possible abscess, if she is unable to get in to our office or if it is after hours / weekend.  If it is seriously worsening causing her to have fevers, spreading redness then best option is Hospital ED.  [name], [facility] 07/13/2016, 1:12 PM

## 2016-07-13 NOTE — Telephone Encounter (Signed)
Pt has a boil on inside thigh and it red around it and oozing.  What does she need to to for it?  Her call back number is [phone]

## 2016-07-13 NOTE — Telephone Encounter (Signed)
Patient has been advised as stated below.[name]

## 2016-07-19 ENCOUNTER — Encounter: Payer: Self-pay | Admitting: *Deleted

## 2016-07-22 ENCOUNTER — Other Ambulatory Visit: Payer: Self-pay | Admitting: Pharmacist

## 2016-07-22 NOTE — Patient Outreach (Signed)
Have made three phone call attempts and sent letter. No response within 14 days. Will now close pharmacy episode.  Harlow Asa, PharmD, [place] Management [phone]

## 2016-08-08 ENCOUNTER — Ambulatory Visit: Payer: Medicare Other | Admitting: Family Medicine

## 2016-08-12 ENCOUNTER — Ambulatory Visit: Payer: Medicare Other | Admitting: *Deleted

## 2016-09-02 ENCOUNTER — Other Ambulatory Visit: Payer: Self-pay

## 2016-09-02 DIAGNOSIS — E1165 Type 2 diabetes mellitus with hyperglycemia: Principal | ICD-10-CM

## 2016-09-02 DIAGNOSIS — IMO0001 Reserved for inherently not codable concepts without codable children: Secondary | ICD-10-CM

## 2016-09-02 DIAGNOSIS — Z794 Long term (current) use of insulin: Principal | ICD-10-CM

## 2016-09-02 MED ORDER — INSULIN PEN NEEDLE 31G X 5 MM MISC: 1.0000 | Freq: Every day | 3 refills | Status: DC

## 2016-09-02 NOTE — Telephone Encounter (Signed)
Last ov  06/27/16 Last filled 09/03/15

## 2016-09-13 DIAGNOSIS — F331 Major depressive disorder, recurrent, moderate: Secondary | ICD-10-CM | POA: Diagnosis not present

## 2016-09-28 ENCOUNTER — Ambulatory Visit: Payer: Medicare Other | Admitting: Family Medicine

## 2016-10-11 ENCOUNTER — Ambulatory Visit (INDEPENDENT_AMBULATORY_CARE_PROVIDER_SITE_OTHER): Payer: Medicare Other | Admitting: Family Medicine

## 2016-10-11 ENCOUNTER — Encounter: Payer: Self-pay | Admitting: Family Medicine

## 2016-10-11 VITALS — BP 128/83 | HR 97 | Temp 97.8°F | Ht 70.5 in | Wt 243.8 lb

## 2016-10-11 DIAGNOSIS — I10 Essential (primary) hypertension: Secondary | ICD-10-CM

## 2016-10-11 DIAGNOSIS — E1165 Type 2 diabetes mellitus with hyperglycemia: Secondary | ICD-10-CM

## 2016-10-11 DIAGNOSIS — F251 Schizoaffective disorder, depressive type: Secondary | ICD-10-CM

## 2016-10-11 DIAGNOSIS — Z794 Long term (current) use of insulin: Secondary | ICD-10-CM | POA: Diagnosis not present

## 2016-10-11 DIAGNOSIS — IMO0002 Reserved for concepts with insufficient information to code with codable children: Secondary | ICD-10-CM

## 2016-10-11 DIAGNOSIS — E1142 Type 2 diabetes mellitus with diabetic polyneuropathy: Secondary | ICD-10-CM | POA: Diagnosis not present

## 2016-10-11 LAB — POCT GLYCOSYLATED HEMOGLOBIN (HGB A1C): Hemoglobin A1C: >14 (ref ?–5.7)

## 2016-10-11 MED ORDER — LISINOPRIL 20 MG PO TABS: 20.0000 mg | ORAL_TABLET | Freq: Every day | ORAL | 3 refills | Status: DC

## 2016-10-11 NOTE — Assessment & Plan Note (Addendum)
Unchanged in 3 months, still remains severely uncontrolled DM2, A1c remains >14% over past >6+ months, still due to very poor lifestyle, med non adherence. Interval history did not even start new DM med GLP1 - Complication with DM neuropathy and chronic foot ulceration  Plan: 1. Again extensive discussion today on importance of adherence to meds / lifestyle, otherwise impossible to control DM, will lead to worsening complications 2. Need to start using Trulicity 0.75mg  weekly inj wc (1st trial on GLP1), able to pick up  Med from pharmacy from last visit. Demonstrated with demo pen today in office. Reviewed potential side effects and concerns. 3. Continue Basaglar 40u daily for now with new start GLP1 - after 2 weeks, start titrating up basal insulin +1 unit every 3 days if fasting AM CBG >180 4. Continue Metformin 1000mg  BID 5. Again needs detailed CBG monitoring - another booklet given to patient to record 6. Needs to schedule recent referrals - to [facility] for DM eye, to [place] for DM education 7. Referral to [facility] for med management and disease education, patient has difficulty due to transportation and limited ability to follow-up chronic conditions make it taxing effort for her to follow-up routinely 8. Follow-up 3 mo DM A1c - discussed significant concern about very poor med adherence, and may need extra help, consider [facility] for other support staff and more intensive DM management

## 2016-10-11 NOTE — Patient Instructions (Addendum)
Thank you for coming in to clinic today.  MEDICATION LIST:  Every day = once a day in morning = Basaglar 40 units (may increase dose see below) Every day = twice a day (breakfast, dinner) = Metformin 1000mg   Once a week in morning with food = Trulicity 0.75mg  injection  ----------------------------------------------------------------------------------------------------------   1. DIabetes remains uncontrolled as discussed - Continue Basaglar insulin 40 units in morning, EVERY day - Continue Metformin 1000mg  TWICE daily  - Start new Diabetes injectable medication - Trulicity (Dulaglutide) 0.75mg  WEEKLY injection, MORNING WITH FOOD  - AFTER FIRST 2 WEEKS OF NEW MEDICINE: then start INCREASING Basaglar insulin by 1 unit every 3 days IF fasting AM sugar is >180 STOP increasing - once Basaglar dose = 50 units  You may feel a little sick for few days to weeks as you adjust to the lowering blood sugar  Use Nasal Saline nose spray over the counter to keep nose moist, may also use some vaseline in nose  ---------------------------------------------------------------------------  Referral to Home Health to [facility] for Medication Management and Diabetes  Medication Alarm Clock - ASK PHARMACIST TO DIRECT YOU TO ONE OF THESE TO HELP TO REMEMBER TO TAKE YOUR MEDICATIONS ON TIME   Call and re-schedule for both of these apts  FOR [place]   [address], [place], [place] [address] Phone: [phone]  [place]   Address: [address] [place], [place],  [address] Phone: [phone]   Please schedule a follow-up appointment with Dr. Parks Ranger in 3 months for Diabetes A1c  If you have any other questions or concerns, please feel free to call the clinic or send a message through [place]. You may also schedule an earlier appointment if necessary.  [name], DO [place]

## 2016-10-11 NOTE — Assessment & Plan Note (Signed)
Well-controlled HTN No home BP readings No known complications    Plan:  1. Continue current BP regimen - Lisinopril 20mg  daily 2. Encourage improved lifestyle - low sodium diet, in addition to other recommendations for DM diet, regular exercise

## 2016-10-11 NOTE — Assessment & Plan Note (Signed)
Stable Continues to follow-up with [facility] Continues on Paxil, Abilify

## 2016-10-11 NOTE — Progress Notes (Addendum)
Subjective:    Patient ID: [name], female    DOB: [birth year], 51 y.o.   MRN: [id number]  [name] is a 51 y.o. female presenting on 10/11/2016 for Diabetes (medication management, also pt complains about a growth in her Rt nostril x 2 weeks ago )  HPI  CHRONIC DM, Type 2 - Uncontrolled with neuropathy / H/o Chronic DM Foot Ulceration - Last visit with me for diabetes 06/27/16, still with uncontrolled DM A1c >14%, treated with new start on GLP1 Trulicity 0.75mg  weekly inj, also referral to [facility] DM education and Ophthalmology [facility], for DM Eye exam), see prior notes for background information. - Interval update - she was unable to make both appointments for nutritionist and eye doctor, mostly due to limited transportation - Today patient reports still concern with elevated blood sugar. She states that she never started new medication, Trulicity, due to her concern if she could take it with the Basaglar insulin, as she thought they were "both insulin" so she did not want to "mix" them. CBGs: does not have written CBG log - reports avg still 200-300 AM fasting, variable readings otherwise up to max 500 rarely Meds: Basaglar 40u units daily in AM (no longer on lantus, changed d/t insurance), Metformin 1000mg  BID - Never started Trulicity 0.75mg  weekly Reports improved compliance, not skipping insulin doses intentionally but still has problem with timing of medication doses, never got medication alarm. Tolerating well w/o side-effects Currently on ACEi Lifestyle: - Diet (Still poor diet, seems to fluctuate only temporary follows low carb diet, problems with drinking soda)  - Exercise (Limited exercise) Admits numbness in feet, chronically Denies hypoglycemia, polyuria, visual changes  Nasal Congestion / Dry Mucsal Reports concern with R>L dry thicker congestion and had some bleeding unsure if abnormality in nose or if due to rubbing. -  Not using nasal saline   CHRONIC HTN: Reports no new concerns Current Meds - Lisinopril 20mg  daily   Reports problem with compliance, misses doses needs help with medication timing. Tolerating well, w/o complaints. Denies CP, dyspnea, HA, edema, dizziness / lightheadedness  Schizoaffective Mood Disorder: - Followed by [facility] - Continues on Abilify 10mg  daily, Paxil 20mg  daily  Depression screen [facility] 2/9 10/11/2016 06/28/2016 06/29/2015  Decreased Interest 1 0 0  Down, Depressed, Hopeless 0 0 0  PHQ - 2 Score 1 0 0  Altered sleeping 1 - -  Tired, decreased energy 1 - -  Change in appetite 1 - -  Feeling bad or failure about yourself  0 - -  Trouble concentrating 0 - -  Moving slowly or fidgety/restless 0 - -  Suicidal thoughts 0 - -  PHQ-9 Score 4 - -      Social History  Substance Use Topics  . Smoking status: Former Smoker    Packs/day: 3.00    Years: 30.00    Quit date: 05/02/2009  . Smokeless tobacco: Never Used  . Alcohol use No    Review of Systems Per HPI unless specifically indicated above     Objective:    BP 128/83 (BP Location: Right Arm, Patient Position: Sitting, Cuff Size: Large)   Pulse 97   Temp 97.8 F (36.6 C) (Oral)   Ht 5' 10.5" (1.791 m)   Wt 243 lb 12.8 oz (110.6 kg)   BMI 34.49 kg/m   Wt Readings from Last 3 Encounters:  10/11/16 243 lb 12.8 oz (110.6 kg)  06/27/16 239 lb (108.4 kg)  04/20/16 239 lb (108.4 kg)    Physical Exam  Constitutional: She appears well-developed and well-nourished. No distress.  Obese, chronically ill but currently comfortable and cooperative  HENT:  Head: Normocephalic and atraumatic.  Mouth/Throat: Oropharynx is clear and moist.  R nare with some mild dry nasal mucosa without obvious turbinate edema, no abnormal lesion some friable tissue no ulceration. Some congestion  Eyes: Conjunctivae are normal.  Neck: Normal range of motion. Neck supple.  Cardiovascular: Normal rate, regular rhythm, normal heart  sounds and intact distal pulses.   No murmur heard. Pulmonary/Chest: Effort normal and breath sounds normal. No respiratory distress. She has no wheezes. She has no rales.  Musculoskeletal: She exhibits no edema or tenderness.  Neurological: She is alert.  Skin: Skin is warm and dry. No rash noted. She is not diaphoretic. No erythema.  Psychiatric: She has a normal mood and affect. Her behavior is normal.  Well groomed, good eye contact, normal speech and thoughts  Nursing note and vitals reviewed.   Diabetic Foot Exam - Simple   Simple Foot Form Diabetic Foot exam was performed with the following findings:  Yes 10/11/2016  9:30 AM  Visual Inspection See comments:  Yes Sensation Testing See comments:  Yes Pulse Check Posterior Tibialis and Dorsalis pulse intact bilaterally:  Yes Comments Bilateral feet with significantly diminished to no sensation of monofilament testing. Bilateral toenails thick with chronic changes. Some thick callus formation on sole of feet, without ulceration.       Results for orders placed or performed in visit on 10/11/16  POCT HgB A1C  Result Value Ref Range   Hemoglobin A1C >14 5.7    Recent Labs  02/24/16 1357 06/27/16 1046 10/11/16 1206  HGBA1C 14.0% >14 >14       Assessment & Plan:   Problem List Items Addressed This Visit    Schizoaffective disorder ([place])    Stable Continues to follow-up with [facility] Continues on Paxil, Abilify      Essential hypertension    Well-controlled HTN No home BP readings No known complications    Plan:  1. Continue current BP regimen - Lisinopril 20mg  daily 2. Encourage improved lifestyle - low sodium diet, in addition to other recommendations for DM diet, regular exercise      Relevant Medications   lisinopril (PRINIVIL,ZESTRIL) 20 MG tablet   DM (diabetes mellitus), type 2, 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HCC) - Primary    Unchanged in 3 months, still remains severely uncontrolled  DM2, A1c remains >14% over past >6+ months, still due to very poor lifestyle, med non adherence. Interval history did not even start new DM med GLP1 - Complication with DM neuropathy and chronic foot ulceration  Plan: 1. Again extensive discussion today on importance of adherence to meds / lifestyle, otherwise impossible to control DM, will lead to worsening complications 2. Need to start using Trulicity 0.75mg  weekly inj wc (1st trial on GLP1), able to pick up  Med from pharmacy from last visit. Demonstrated with demo pen today in office. Reviewed potential side effects and concerns. 3. Continue Basaglar 40u daily for now with new start GLP1 - after 2 weeks, start titrating up basal insulin +1 unit every 3 days if fasting AM CBG >180 4. Continue Metformin 1000mg  BID 5. Again needs detailed CBG monitoring - another booklet given to patient to record 6. Needs to schedule recent referrals - to [facility] for DM eye, to [place] for DM education 7. Referral to [facility] for  med management and disease education, patient has difficulty due to transportation and limited ability to follow-up chronic conditions make it taxing effort for her to follow-up routinely 8. Follow-up 3 mo DM A1c - discussed significant concern about very poor med adherence, and may need extra help, consider [facility] for other support staff and more intensive DM management      Relevant Medications   lisinopril (PRINIVIL,ZESTRIL) 20 MG tablet   Other Relevant Orders   POCT HgB A1C (Completed)   Ambulatory referral to [place]      Nasal Mucosa - advised to start using nasal saline first, follow-up  Meds ordered this encounter  Medications  . lisinopril (PRINIVIL,ZESTRIL) 20 MG tablet    Sig: Take 1 tablet (20 mg total) by mouth daily.    Dispense:  90 tablet    Refill:  3  .                   Follow up plan: Return in about 3 months (around 01/11/2017) for  diabetes.  [name], [facility] 10/11/2016, 9:33 PM

## 2016-10-13 ENCOUNTER — Telehealth: Payer: Self-pay | Admitting: Family Medicine

## 2016-10-13 DIAGNOSIS — E1165 Type 2 diabetes mellitus with hyperglycemia: Secondary | ICD-10-CM | POA: Diagnosis not present

## 2016-10-13 DIAGNOSIS — E1142 Type 2 diabetes mellitus with diabetic polyneuropathy: Secondary | ICD-10-CM | POA: Diagnosis not present

## 2016-10-13 NOTE — Telephone Encounter (Signed)
Acknowledged. Will sign orders upon receipt. Agree with plan.  [name], [facility] 10/13/2016, 5:49 PM

## 2016-10-13 NOTE — Telephone Encounter (Signed)
Denyse Amass [facility] nurse with [facility] called. Per Denyse Amass [facility] started today and he will be faxing order for 2 more visits next week followed by 1 visit per week for 4 weeks. Call back for Denyse Amass is [phone]

## 2016-10-17 ENCOUNTER — Telehealth: Payer: Self-pay | Admitting: Family Medicine

## 2016-10-17 DIAGNOSIS — E1142 Type 2 diabetes mellitus with diabetic polyneuropathy: Secondary | ICD-10-CM | POA: Diagnosis not present

## 2016-10-17 DIAGNOSIS — E1165 Type 2 diabetes mellitus with hyperglycemia: Secondary | ICD-10-CM | POA: Diagnosis not present

## 2016-10-17 NOTE — Telephone Encounter (Signed)
As per Denyse Amass patient's sugar for Saturday was 203-272 after following diet and limit on carb's and not having frequency for urination and not thirsty or feeling weak since sugar was lower as compare to previous reading she was doing much better until today her Blood sugar after breakfast (sugary bowl of cereal) was 425 so insulin was increase by 1 units.

## 2016-10-17 NOTE — Telephone Encounter (Signed)
Denyse Amass the [place] home health nurse called regarding changes in patient's blood sugars. Please call him bact at [phone].

## 2016-10-17 NOTE — Telephone Encounter (Signed)
Attempted to call [name] back, after hours. Left voicemail. I agree with plan. Thanked for the update. One main goal is to adhere to the new Trulicity injection weekly, and ideally once this is effective maybe less Basaglar would be needed, however for now I agree to titrate up insulin +1 unit every 2-3 days if fasting AM CBG is >180.  I would request that she check FASTING AM sugar, not post-prandial to titrate her insulin.  If any further concerns or question. May contact us again, otherwise continue current plan.  I notified [name] that I would be out of office Weds 6/20 through Fri 6/22.  [name], [facility] 10/17/2016, 5:15 PM

## 2016-10-20 DIAGNOSIS — E1165 Type 2 diabetes mellitus with hyperglycemia: Secondary | ICD-10-CM | POA: Diagnosis not present

## 2016-10-20 DIAGNOSIS — E1142 Type 2 diabetes mellitus with diabetic polyneuropathy: Secondary | ICD-10-CM | POA: Diagnosis not present

## 2016-10-24 ENCOUNTER — Telehealth: Payer: Self-pay | Admitting: Family Medicine

## 2016-10-24 DIAGNOSIS — E1142 Type 2 diabetes mellitus with diabetic polyneuropathy: Secondary | ICD-10-CM | POA: Diagnosis not present

## 2016-10-24 DIAGNOSIS — E1165 Type 2 diabetes mellitus with hyperglycemia: Secondary | ICD-10-CM | POA: Diagnosis not present

## 2016-10-24 NOTE — Telephone Encounter (Signed)
Acknowledged the update from [place] with [facility].  Please contact Denyse Amass back to review my following comments.  The nausea without vomiting is a common side effect on Trulicity. It is typical to last a few days since it is a weekly injection. It is common to have nausea for first few weeks when first starting on this medicine, then it should improve eventually.  She may take pepto or any over the counter stomach medicine to settle her nausea.  If interested, she can notify our office to request a new rx for Zofran as needed for nausea only to use at time of taking Trulicity and for 1-3 days after dose. This would only be temporary.  I agree with insulin adjust, continue to work on this plan, CBGs seem improved.  In future if she cannot tolerate Trulicity still with nausea, then we can consider getting her approved for [facility] as a next option, same medicine but less nausea.  [name], [facility] 10/24/2016, 6:37 PM

## 2016-10-24 NOTE — Telephone Encounter (Signed)
[name] with [facility] said pt has persistent nausea that started with her first dose of trulicity on 8/54 and it resolved on the 19th.  It started again on the 21st with next dose.  She is not vomiting.  [name] thought it could be coming from [employer]. Pt asked if she could take pepto bismol.  His call back number is [phone].  She also has adjusted basaglar from 40 units to 43 daily per your orders.  Her blood sugar has been running 142-263 since Friday.

## 2016-10-25 NOTE — Telephone Encounter (Signed)
I left a very detail message on Jesse vm stating what Dr. Raliegh Ip recommended.

## 2016-11-02 DIAGNOSIS — E1142 Type 2 diabetes mellitus with diabetic polyneuropathy: Secondary | ICD-10-CM | POA: Diagnosis not present

## 2016-11-02 DIAGNOSIS — E1165 Type 2 diabetes mellitus with hyperglycemia: Secondary | ICD-10-CM | POA: Diagnosis not present

## 2016-11-08 ENCOUNTER — Telehealth: Payer: Self-pay

## 2016-11-08 ENCOUNTER — Other Ambulatory Visit: Payer: Self-pay

## 2016-11-08 DIAGNOSIS — IMO0001 Reserved for inherently not codable concepts without codable children: Secondary | ICD-10-CM

## 2016-11-08 DIAGNOSIS — E1165 Type 2 diabetes mellitus with hyperglycemia: Principal | ICD-10-CM

## 2016-11-08 DIAGNOSIS — Z794 Long term (current) use of insulin: Principal | ICD-10-CM

## 2016-11-08 MED ORDER — METFORMIN HCL 1000 MG PO TABS: 1000.0000 mg | ORAL_TABLET | Freq: Two times a day (BID) | ORAL | 11 refills | Status: DC

## 2016-11-08 NOTE — Telephone Encounter (Signed)
Refilled metformin.  [name], [facility] 11/08/2016, 4:24 PM

## 2016-11-23 ENCOUNTER — Encounter: Payer: Self-pay | Admitting: Family Medicine

## 2016-11-23 ENCOUNTER — Ambulatory Visit (INDEPENDENT_AMBULATORY_CARE_PROVIDER_SITE_OTHER): Payer: Medicare Other | Admitting: Family Medicine

## 2016-11-23 VITALS — BP 137/82 | HR 100 | Temp 97.8°F | Resp 16 | Ht 70.5 in | Wt 240.0 lb

## 2016-11-23 DIAGNOSIS — K5900 Constipation, unspecified: Secondary | ICD-10-CM

## 2016-11-23 DIAGNOSIS — N390 Urinary tract infection, site not specified: Secondary | ICD-10-CM

## 2016-11-23 LAB — POCT URINALYSIS DIPSTICK
Bilirubin, UA: NEGATIVE
Glucose, UA: NEGATIVE
Ketones, UA: NEGATIVE
Nitrite, UA: POSITIVE
Spec Grav, UA: 1.015 (ref 1.010–1.025)
Urobilinogen, UA: 1 E.U./dL
pH, UA: 5 (ref 5.0–8.0)

## 2016-11-23 MED ORDER — CEPHALEXIN 500 MG PO CAPS: 500.0000 mg | ORAL_CAPSULE | Freq: Three times a day (TID) | ORAL | 0 refills | Status: DC

## 2016-11-23 NOTE — Patient Instructions (Addendum)
Thank you for coming to the clinic today.  1. You have a Urinary Tract Infection - this is very common, your symptoms are reassuring and you should get better within 1 week on the antibiotics - Start Keflex 500mg  3 times daily for next 7 days, complete entire course, even if feeling better - We sent urine for a culture, we will call you within next few days if we need to change antibiotics - Please drink plenty of fluids, improve hydration over next 1 week  If symptoms worsening, developing nausea / vomiting, worsening back pain, fevers / chills / sweats, then please return for re-evaluation sooner.  To prevent bladder and kidney infections...  Wipe front to back after using the restroom Drink enough water to keep your pee clear to pale yellow  If you think you are getting another bladder infection, start drinking cranberry juice and come see [place] so we can check the urine.  ------------------ For constipation try Dulocolax or Colace 100mg  capsules once every evening for stool softener, can increase to 2 pills at bedtime for several days if needed. Improve hydration  Please schedule a Follow-up Appointment to: Return in about 1 week (around 11/30/2016), or if symptoms worsen or fail to improve, for UTI if not improved.  If you have any other questions or concerns, please feel free to call the clinic or send a message through [place]. You may also schedule an earlier appointment if necessary.  Additionally, you may be receiving a survey about your experience at our clinic within a few days to 1 week by e-mail or mail. We value your feedback.  [name], DO [facility] [place]

## 2016-11-23 NOTE — Progress Notes (Signed)
Subjective:    Patient ID: [name], female    DOB: 10-Dec-1965, 51 y.o.   MRN: [id number]  [name] is a 51 y.o. female presenting on 11/23/2016 for Urinary Tract Infection (burning pain noticed  blood in urine onset 6 days)  Patient presents for a same day appointment.  HPI   UTI Reports symptoms started 6 days ago on Thursday, initially symptoms with worsening dysuria and burning with each void, now some improvement, but still has symptoms of occasional dysuria and discomfort. Admits had episode of nausea and some left side back pain few days ago now resolved. Has been a while since last UTI. Has history of poorly controlled Type 2 DM - Admits constipation for few days tried X-lax without relief - Admits urinary frequency - Denies any fevers/chills, sweats, abdominal pain, hematuria, worsening back pain, nausea, vomiting   Social History  Substance Use Topics  . Smoking status: Former Smoker    Packs/day: 3.00    Years: 30.00    Quit date: 05/02/2009  . Smokeless tobacco: Never Used  . Alcohol use No    Review of Systems Per HPI unless specifically indicated above     Objective:    BP 137/82   Pulse 100   Temp 97.8 F (36.6 C) (Oral)   Resp 16   Ht 5' 10.5" (1.791 m)   Wt 240 lb (108.9 kg)   BMI 33.95 kg/m   Wt Readings from Last 3 Encounters:  11/23/16 240 lb (108.9 kg)  10/11/16 243 lb 12.8 oz (110.6 kg)  06/27/16 239 lb (108.4 kg)    Physical Exam  Constitutional: She is oriented to person, place, and time. She appears well-developed and well-nourished. No distress.  Well-appearing, comfortable, cooperative  HENT:  Head: Normocephalic and atraumatic.  Mouth/Throat: Oropharynx is clear and moist.  Eyes: Conjunctivae are normal. Right eye exhibits no discharge. Left eye exhibits no discharge.  Cardiovascular: Normal rate.   Pulmonary/Chest: Effort normal.  Abdominal: Soft. Bowel sounds are normal. She exhibits no distension. There is no  tenderness.  Musculoskeletal: She exhibits no edema.  Back - No reproducible CVAT or low back muscle tenderness or spasm.  Neurological: She is alert and oriented to person, place, and time.  Skin: Skin is warm and dry. No rash noted. She is not diaphoretic. No erythema.  Psychiatric: She has a normal mood and affect. Her behavior is normal.  Well groomed, good eye contact, normal speech and thoughts  Nursing note and vitals reviewed.  Results for orders placed or performed in visit on 11/23/16  POCT Urinalysis Dipstick  Result Value Ref Range   Color, UA light amber    Clarity, UA cloudy    Glucose, UA negative    Bilirubin, UA negative    Ketones, UA negative    Spec Grav, UA 1.015 1.010 - 1.025   Blood, UA large    pH, UA 5.0 5.0 - 8.0   Protein, UA trace    Urobilinogen, UA 1.0 0.2 or 1.0 E.U./dL   Nitrite, UA positive    Leukocytes, UA Large (3+) (A) Negative      Assessment & Plan:   Problem List Items Addressed This Visit    None    Visit Diagnoses    Urinary tract infection without hematuria, site unspecified    -  Primary  Clinically consistent with UTI and confirmed on UA. No recent UTIs or abx courses. Known DM2 (poor control). No concern for pyelo today (no  systemic symptoms, neg fever, back pain, n/v - initially had concern with x 1 day of some back and n/v symptoms but since resolved seems unrelated).  Plan: 1. UA today - large leuks 2. Ordered Urine culture 3. Keflex 500mg  TID x 7 days 4. Improve PO hydration 5. RTC if no improvement 1-2 weeks, red flags given to return sooner     Relevant Medications   cephALEXin (KEFLEX) 500 MG capsule   Other Relevant Orders   POCT Urinalysis Dipstick (Completed)   CULTURE, URINE COMPREHENSIVE   Constipation, unspecified constipation type     - Start OTC docusate 100-200mg  nightly PRN       Meds ordered this encounter  Medications  . cephALEXin (KEFLEX) 500 MG capsule    Sig: Take 1 capsule (500 mg total) by  mouth 3 (three) times daily. For 7 days    Dispense:  21 capsule    Refill:  0    Follow up plan: Return in about 1 week (around 11/30/2016), or if symptoms worsen or fail to improve, for UTI if not improved.  Follow-up as scheduled already in September 2018 for routine DM  [name], DO [facility] 11/23/2016, 12:39 PM

## 2016-11-25 LAB — CULTURE, URINE COMPREHENSIVE

## 2016-12-13 DIAGNOSIS — F331 Major depressive disorder, recurrent, moderate: Secondary | ICD-10-CM | POA: Diagnosis not present

## 2016-12-14 DIAGNOSIS — F331 Major depressive disorder, recurrent, moderate: Secondary | ICD-10-CM | POA: Diagnosis not present

## 2016-12-30 ENCOUNTER — Other Ambulatory Visit: Payer: Self-pay | Admitting: Family Medicine

## 2016-12-30 DIAGNOSIS — E1142 Type 2 diabetes mellitus with diabetic polyneuropathy: Secondary | ICD-10-CM

## 2016-12-30 DIAGNOSIS — Z794 Long term (current) use of insulin: Principal | ICD-10-CM

## 2016-12-30 DIAGNOSIS — IMO0002 Reserved for concepts with insufficient information to code with codable children: Secondary | ICD-10-CM

## 2016-12-30 DIAGNOSIS — E1165 Type 2 diabetes mellitus with hyperglycemia: Principal | ICD-10-CM

## 2017-01-18 ENCOUNTER — Encounter: Payer: Self-pay | Admitting: Family Medicine

## 2017-01-18 ENCOUNTER — Ambulatory Visit (INDEPENDENT_AMBULATORY_CARE_PROVIDER_SITE_OTHER): Payer: Medicare Other | Admitting: Family Medicine

## 2017-01-18 VITALS — BP 109/66 | HR 96 | Temp 97.5°F | Ht 70.5 in | Wt 242.0 lb

## 2017-01-18 DIAGNOSIS — E1169 Type 2 diabetes mellitus with other specified complication: Secondary | ICD-10-CM | POA: Diagnosis not present

## 2017-01-18 DIAGNOSIS — Z794 Long term (current) use of insulin: Secondary | ICD-10-CM | POA: Diagnosis not present

## 2017-01-18 DIAGNOSIS — E785 Hyperlipidemia, unspecified: Secondary | ICD-10-CM | POA: Diagnosis not present

## 2017-01-18 DIAGNOSIS — E1142 Type 2 diabetes mellitus with diabetic polyneuropathy: Secondary | ICD-10-CM | POA: Diagnosis not present

## 2017-01-18 DIAGNOSIS — Z23 Encounter for immunization: Secondary | ICD-10-CM

## 2017-01-18 DIAGNOSIS — E1165 Type 2 diabetes mellitus with hyperglycemia: Secondary | ICD-10-CM

## 2017-01-18 DIAGNOSIS — G629 Polyneuropathy, unspecified: Secondary | ICD-10-CM

## 2017-01-18 DIAGNOSIS — IMO0002 Reserved for concepts with insufficient information to code with codable children: Secondary | ICD-10-CM

## 2017-01-18 LAB — POCT GLYCOSYLATED HEMOGLOBIN (HGB A1C): Hemoglobin A1C: 8.2

## 2017-01-18 MED ORDER — ATORVASTATIN CALCIUM 40 MG PO TABS: 40.0000 mg | ORAL_TABLET | Freq: Every day | ORAL | 3 refills | Status: DC

## 2017-01-18 MED ORDER — GABAPENTIN 100 MG PO CAPS: ORAL_CAPSULE | ORAL | 3 refills | Status: DC

## 2017-01-18 NOTE — Assessment & Plan Note (Signed)
Non adherence to Statin Refill Atorvastatin 40mg  nightly, resume therapy

## 2017-01-18 NOTE — Patient Instructions (Addendum)
Thank you for coming to the clinic today.  1. Great job! Congraulations on lower A1c from >14 % and now down to 8.2%  - May reduce Basaglar insulin from 44 units daily in morning down by 1 unit if morning fasting < 120 for a few readings, goal to reduce down to 40 units or less  Continue Trulicity 0.75mg  weekly for now - if need to in future can reduce dose to 1.5mg  weekly, this may reduce weight further and you can reduce Basaglar further.  Please schedule a Follow-up Appointment to: Return in about 3 months (around 04/19/2017) for DM A1c, Weight.  If you have any other questions or concerns, please feel free to call the clinic or send a message through [place]. You may also schedule an earlier appointment if necessary.  Additionally, you may be receiving a survey about your experience at our clinic within a few days to 1 week by e-mail or mail. We value your feedback.  [name], DO [place]

## 2017-01-18 NOTE — Progress Notes (Signed)
Subjective:    Patient ID: [name], female    DOB: 26-Oct-1965, 51 y.o.   MRN: [id number]  [name] is a 51 y.o. female presenting on 01/18/2017 for Diabetes (f/u) and Spasms (in the right arm, and chest comfort on the right side x 3-4 weeks )  HPI   CHRONIC DM, Type 2 / Complication with DM neuropathy - Last visit with me 10/11/16 for DM, treated with GLP1 Trulicity - she was non adherent previously, among other therapy, also was referred to [facility] for monitoring, see prior notes for background information. - Interval update with patient admits dramatic improvement in med adherence, now using Trulicity every Friday, she benefited from [facility] education and adherence at home to meds. Rarely misses any meds, keeps close watch on CBG now. - Today patient reports overall very pleased with improved A1c >14 to 8.2. Admits that she does feel a little sluggish or general malaise at times, attributes this to body transitioning to low sugars as we discussed at last visit. - CBGs: Avg 130-150, Low >100, High 250 rarely. Checks CBGs 3 x daily - has checked some 1 hr postprandial incorrectly Meds: Basaglar 44 daily [place] daily AM, Trulicity 0.75mg  weekly (Friday), Metformin 1000mg  BID Reports much improved compliance. Tolerating well except still some occasional diarrhea stomach upset from metformin and nausea from trulicity, now improved after on for several months Currently on ACEi Lifestyle: - Diet (Improved DM Diet, low carb, reduced portion size, sugar free foods)  - Exercise (Some walking, but plans to get exercise bike and increase physical activity now) - Due for DM Eye exam, she has not scheduled yet, has already been given contact info - Regarding DM Neuropathy Admits numbness in feet, chronically, also some numbness in hands at times. No longer taking Gabapentin 300mg , off for months, would like refill to resume - Also request refill for Atorvastatin 40mg , off this med, needs  refill Denies hypoglycemia, polyuria, visual changes  Additional complaint - Muscle Twitching/Spasm / Hand Tingling - Reports new concerns with past 3-4 weeks R elbow arm muscle twitching intermittent, no clear trigger or provoking problem, seems to occur if leans on R elbow or holding arms up at times. Some occasional numbness and tingling and some sharp pains in L hand fingers. Intermittent episodes. No longer taking Gabapentin  Health Maintenance: - Due for Flu Shot, will get today  Social History  Substance Use Topics  . Smoking status: Former Smoker    Packs/day: 3.00    Years: 30.00    Quit date: 05/02/2009  . Smokeless tobacco: Never Used  . Alcohol use No    Review of Systems Per HPI unless specifically indicated above     Objective:    BP 109/66 (BP Location: Right Arm, Patient Position: Sitting, Cuff Size: Large)   Pulse 96   Temp (!) 97.5 F (36.4 C) (Oral)   Ht 5' 10.5" (1.791 m)   Wt 242 lb (109.8 kg)   SpO2 100%   BMI 34.23 kg/m   Wt Readings from Last 3 Encounters:  01/18/17 242 lb (109.8 kg)  11/23/16 240 lb (108.9 kg)  10/11/16 243 lb 12.8 oz (110.6 kg)    Physical Exam  Constitutional: She is oriented to person, place, and time. She appears well-developed and well-nourished. No distress.  Obese, chronically ill but seems more well appearing now, comfortable and cooperative  HENT:  Head: Normocephalic and atraumatic.  Mouth/Throat: Oropharynx is clear and moist.  Eyes: Conjunctivae are  normal. Right eye exhibits no discharge. Left eye exhibits no discharge.  Neck: Normal range of motion. Neck supple.  Cardiovascular: Normal rate, regular rhythm, normal heart sounds and intact distal pulses.   No murmur heard. Pulmonary/Chest: Effort normal and breath sounds normal. No respiratory distress. She has no wheezes. She has no rales. She exhibits no tenderness.  Musculoskeletal: Normal range of motion. She exhibits no edema.  Right Upper extremity - inner  elbow forearm muscles normal appearing, non tender, no spasm, no twitching. No reduced strength upper extremity. Normal ROM.  Neurological: She is alert and oriented to person, place, and time.  Distal sensation intact to light touch bilateral extremities  Skin: Skin is warm and dry. No rash noted. She is not diaphoretic. No erythema.  Psychiatric: She has a normal mood and affect. Her behavior is normal.  Well groomed, good eye contact, normal speech and thoughts. Improved insight into her own health.  Nursing note and vitals reviewed.    Results for orders placed or performed in visit on 01/18/17  POCT HgB A1C  Result Value Ref Range   Hemoglobin A1C 8.2     Recent Labs  06/27/16 1046 10/11/16 1206 01/18/17 1122  HGBA1C >14 >14 8.2      Assessment & Plan:   Problem List Items Addressed This Visit    Polyneuropathy    Chronic problem, now with improved A1c control 14 to 8%, should gradually improve Has stocking / glove distribution as well. Prior poor adherence to Gabapentin, uncertain benefits Agree to resume therapy - new rx sent Gabapentin 100mg  titration instructions      Relevant Medications   gabapentin (NEURONTIN) 100 MG capsule   Hyperlipidemia associated with type 2 diabetes mellitus (HCC)    Non adherence to Statin Refill Atorvastatin 40mg  nightly, resume therapy      Relevant Medications   atorvastatin (LIPITOR) 40 MG tablet   DM (diabetes mellitus), type 2, 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HCC) - Primary    Dramatic improved control A1c from >14 to 8.2% - on GLP1, basal insulin, improved diet Complications - peripheral neuropathy, h/o chronic foot ulceration  Plan:  1. Continue current therapy - Trulicity 0.75mg  weekly, Basaglar 44 units daily AM, Metformin 1000mg  BID - Discussed future dosages and consider reduce insulin and may increase Trulicity to 1.5, ideally for more wt loss and better control, if tolerated due to nausea. - Titrate down  basaglar down by 1 unit every 3-5 days if fasting CBG < 120 consistently 2. Encourage improved lifestyle - continue improving low carb, low sugar diet, reduce portion size, continue improving and really start regular exercise 3. Check CBG x 3 daily - now 2 hour postprandial instead of 1 hr, bring log to next visit for review 4. Continue ACEi, Statin 5. Advised to schedule DM ophtho exam, send record 6. Follow-up 3 months DM A1c - adjust meds if need      Relevant Medications   atorvastatin (LIPITOR) 40 MG tablet   gabapentin (NEURONTIN) 100 MG capsule   Other Relevant Orders   POCT HgB A1C (Completed)    Other Visit Diagnoses    Needs flu shot       Relevant Orders   Flu Vaccine QUAD 36+ mos IM       Follow up plan: Return in about 3 months (around 04/19/2017) for DM A1c, Weight.  [name], [facility] 01/18/2017, 11:57 AM

## 2017-01-18 NOTE — Assessment & Plan Note (Signed)
Chronic problem, now with improved A1c control 14 to 8%, should gradually improve Has stocking / glove distribution as well. Prior poor adherence to Gabapentin, uncertain benefits Agree to resume therapy - new rx sent Gabapentin 100mg  titration instructions

## 2017-01-18 NOTE — Assessment & Plan Note (Signed)
Dramatic improved control A1c from >14 to 8.2% - on GLP1, basal insulin, improved diet Complications - peripheral neuropathy, h/o chronic foot ulceration  Plan:  1. Continue current therapy - Trulicity 0.75mg  weekly, Basaglar 44 units daily AM, Metformin 1000mg  BID - Discussed future dosages and consider reduce insulin and may increase Trulicity to 1.5, ideally for more wt loss and better control, if tolerated due to nausea. - Titrate down basaglar down by 1 unit every 3-5 days if fasting CBG < 120 consistently 2. Encourage improved lifestyle - continue improving low carb, low sugar diet, reduce portion size, continue improving and really start regular exercise 3. Check CBG x 3 daily - now 2 hour postprandial instead of 1 hr, bring log to next visit for review 4. Continue ACEi, Statin 5. Advised to schedule DM ophtho exam, send record 6. Follow-up 3 months DM A1c - adjust meds if need

## 2017-02-01 ENCOUNTER — Other Ambulatory Visit: Payer: Self-pay | Admitting: Family Medicine

## 2017-02-01 DIAGNOSIS — IMO0002 Reserved for concepts with insufficient information to code with codable children: Secondary | ICD-10-CM

## 2017-02-01 DIAGNOSIS — Z794 Long term (current) use of insulin: Principal | ICD-10-CM

## 2017-02-01 DIAGNOSIS — E1165 Type 2 diabetes mellitus with hyperglycemia: Principal | ICD-10-CM

## 2017-02-01 DIAGNOSIS — E1142 Type 2 diabetes mellitus with diabetic polyneuropathy: Secondary | ICD-10-CM

## 2017-04-26 DIAGNOSIS — S91209A Unspecified open wound of unspecified toe(s) with damage to nail, initial encounter: Secondary | ICD-10-CM | POA: Diagnosis not present

## 2017-05-10 ENCOUNTER — Encounter: Payer: Self-pay | Admitting: Family Medicine

## 2017-05-10 ENCOUNTER — Other Ambulatory Visit: Payer: Self-pay | Admitting: Family Medicine

## 2017-05-10 ENCOUNTER — Ambulatory Visit (INDEPENDENT_AMBULATORY_CARE_PROVIDER_SITE_OTHER): Payer: Medicare Other | Admitting: Family Medicine

## 2017-05-10 VITALS — BP 130/84 | HR 107 | Temp 98.3°F | Resp 16 | Ht 70.5 in | Wt 260.0 lb

## 2017-05-10 DIAGNOSIS — E1165 Type 2 diabetes mellitus with hyperglycemia: Secondary | ICD-10-CM

## 2017-05-10 DIAGNOSIS — G629 Polyneuropathy, unspecified: Secondary | ICD-10-CM

## 2017-05-10 DIAGNOSIS — I10 Essential (primary) hypertension: Secondary | ICD-10-CM

## 2017-05-10 DIAGNOSIS — E785 Hyperlipidemia, unspecified: Secondary | ICD-10-CM

## 2017-05-10 DIAGNOSIS — Z79899 Other long term (current) drug therapy: Secondary | ICD-10-CM

## 2017-05-10 DIAGNOSIS — E1149 Type 2 diabetes mellitus with other diabetic neurological complication: Secondary | ICD-10-CM

## 2017-05-10 DIAGNOSIS — Z0000 Encounter for general adult medical examination without abnormal findings: Secondary | ICD-10-CM

## 2017-05-10 DIAGNOSIS — IMO0002 Reserved for concepts with insufficient information to code with codable children: Secondary | ICD-10-CM

## 2017-05-10 DIAGNOSIS — E1169 Type 2 diabetes mellitus with other specified complication: Secondary | ICD-10-CM

## 2017-05-10 DIAGNOSIS — F251 Schizoaffective disorder, depressive type: Secondary | ICD-10-CM

## 2017-05-10 DIAGNOSIS — Z1159 Encounter for screening for other viral diseases: Secondary | ICD-10-CM

## 2017-05-10 LAB — POCT GLYCOSYLATED HEMOGLOBIN (HGB A1C): Hemoglobin A1C: 10 — AB (ref ?–5.7)

## 2017-05-10 MED ORDER — DULAGLUTIDE 1.5 MG/0.5ML ~~LOC~~ SOAJ: 1.5000 mg | SUBCUTANEOUS | 11 refills | Status: DC

## 2017-05-10 NOTE — Assessment & Plan Note (Signed)
Stable, chronic problem, now with improved A1c control but recent worsening 8 to 10 Has stocking / glove distribution as well. INCREASE Gabapentin 100mg  titration instructions - now 100mg  AM and 200mg  PM

## 2017-05-10 NOTE — Assessment & Plan Note (Addendum)
Again worsening control, now uncontrolled DM with A1c 10.0 (inc from prior 8.4, previously >14, so still improved relatively) Complications - peripheral neuropathy  Plan:  1. INCREASE Trulicity from 3.14 to 1.5mg  weekly [place] injection - new rx ordered - counsel may have nausea vomiting early on, goal for some more weight loss with this - Continue Metformin 1000mg  BID - Continue Basaglar 44u daily - advised titrate DOWN by 1 unit every 3-5 days if fasting AM sugar < 160 2. Encourage improved lifestyle - RESUME DM Diet - low carb, low sugar diet, reduce portion size, start regular exercise - exercise bike plan 3. Check CBG, bring log to next visit for review 4. Continue ACEi, Statin 5. Advised to schedule DM ophtho exam, send record 6. For Diabetic Shoes - recommend that she call her [facility] to request order 7. Follow-up 3 months labs, A1c physical - consider SGLT2 in future and further titrate down insulin

## 2017-05-10 NOTE — Assessment & Plan Note (Signed)
Mildly elevated initial BP, repeat manual check improved. - Home BP readings none available     Plan:  1. Continue current BP regimen - Lisinopril 20mg  daily 2. Encourage improved lifestyle - low sodium diet, regular exercise (start improving) 3. Start monitor BP outside office, bring readings to next visit, if persistently >140/90 or new symptoms notify office sooner 4. Follow-up 3 months annual + labs

## 2017-05-10 NOTE — Assessment & Plan Note (Signed)
Worsening weight gain, +15-18 lbs in past 3-4 months, worse lifestyle diet / limited exercise Morbid obesity based on co-morbidities with DM, HTN, HLD BMI >36 but < 40  Encouraged weight loss with lifestyle changes diet, reduce portion, handouts given, start exercise regular, goals wt loss 1 lb or more weekly Increase Trulicity to max dose 1.5 weekly extra wt loss Future consider SGLT2 if need for DM

## 2017-05-10 NOTE — Progress Notes (Signed)
Subjective:    Patient ID: [name], female    DOB: 08-25-1965, 52 y.o.   MRN: [id number]  [name] is a 52 y.o. female presenting on 05/10/2017 for Diabetes (highest 235 lowest 200)  Patient is accompanied by her Mother, [name], who provides additional history.  HPI   CHRONIC DM, Type 2 / Complication with DM neuropathy - Last visit with me 01/18/17 for DM, she was significantly improved DM control A1c from 14 to 8.4 on Trulicity GLP 4.01 and basaglar, metformin, improved lifestyle, see last note for background information - Interval update: She does admit to worsening diet and lifestyle, not adherent since last visit, and has elevated sugars - Today reports concern with A1c elevated. She attributes holidays and stress to poor diet. Still using medicine, admits some GI intolerance side effect on metformin but able to tolerate well. She had some nausea on Trulicity initially but then improved - CBGs: Recently increased from prior avg < 150 was improved, now back to 200-250 fasting AM, checks up to 3x daily Meds: Basaglar 44 daily [place] daily AM (no dose change), Trulicity 0.75mg  weekly (Friday), Metformin 1000mg  BID Still improved compliance Currently on ACEi Lifestyle: - Diet (Now worsened diet since last visit, holidays, eating more sweets larger portion, admits needs to resume prior DM Diet, low carb, reduced portion size, sugar free foods)  - Exercise (Some walking, but plans to get exercise bike and increase physical activity now) - Regarding DM Neuropathy Admits numbness in feet, chronically, back on Gabapentin 100mg  BID some relief Denies hypoglycemia, polyuria, visual changes  Additional history Hot Flashes - post-menopausal, she is already taking Paxil, Gabapentin  CHRONIC HTN: Reports not checking BP at home. She can try to get a BP cuff. Current Meds - Lisinopril 20mg  daily Reports good compliance, took meds today. Tolerating well, w/o complaints. Denies CP,  dyspnea, HA, edema, dizziness / lightheadedness   Health Maintenance:  Due for DM Eye Exam at [facility], she has not re-scheduled yet, contact info given  Depression screen [facility] 2/9 05/10/2017 10/11/2016 06/28/2016  Decreased Interest 0 1 0  Down, Depressed, Hopeless 0 0 0  PHQ - 2 Score 0 1 0  Altered sleeping - 1 -  Tired, decreased energy - 1 -  Change in appetite - 1 -  Feeling bad or failure about yourself  - 0 -  Trouble concentrating - 0 -  Moving slowly or fidgety/restless - 0 -  Suicidal thoughts - 0 -  PHQ-9 Score - 4 -    Social History   Tobacco Use  . Smoking status: Former Smoker    Packs/day: 3.00    Years: 30.00    Pack years: 90.00    Last attempt to quit: 05/02/2009    Years since quitting: 8.0  . Smokeless tobacco: Never Used  Substance Use Topics  . Alcohol use: No  . Drug use: No    Review of Systems Per HPI unless specifically indicated above     Objective:    BP 130/84 (BP Location: Left Arm, Cuff Size: Normal)   Pulse (!) 107   Temp 98.3 F (36.8 C)   Resp 16   Ht 5' 10.5" (1.791 m)   Wt 260 lb (117.9 kg)   BMI 36.78 kg/m   Wt Readings from Last 3 Encounters:  05/10/17 260 lb (117.9 kg)  01/18/17 242 lb (109.8 kg)  11/23/16 240 lb (108.9 kg)    Physical Exam  Constitutional: She is oriented  to person, place, and time. She appears well-developed and well-nourished. No distress.  Well-appearing, comfortable, cooperative, obese  HENT:  Head: Normocephalic and atraumatic.  Mouth/Throat: Oropharynx is clear and moist.  Eyes: Conjunctivae are normal. Right eye exhibits no discharge. Left eye exhibits no discharge.  Neck: Normal range of motion. Neck supple.  Cardiovascular: Regular rhythm, normal heart sounds and intact distal pulses.  No murmur heard. Tachycardic  Pulmonary/Chest: Effort normal and breath sounds normal. No respiratory distress. She has no wheezes. She has no rales.  Musculoskeletal: Normal range of motion. She  exhibits no edema.  Lymphadenopathy:    She has no cervical adenopathy.  Neurological: She is alert and oriented to person, place, and time.  Skin: Skin is warm and dry. No rash noted. She is not diaphoretic. No erythema.  Psychiatric: She has a normal mood and affect. Her behavior is normal.  Well groomed, good eye contact, normal speech and thoughts, slightly anxious appearing  Nursing note and vitals reviewed.  Results for orders placed or performed in visit on 05/10/17  POCT HgB A1C  Result Value Ref Range   Hemoglobin A1C 10.0 (A) 5.7      Assessment & Plan:   Problem List Items Addressed This Visit    DM (diabetes mellitus), type 2, 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[place]) - Primary    Again worsening control, now uncontrolled DM with A1c 10.0 (inc from prior 8.4, previously >14, so still improved relatively) Complications - peripheral neuropathy  Plan:  1. INCREASE Trulicity from 8.31 to 1.5mg  weekly [place] injection - new rx ordered - counsel may have nausea vomiting early on, goal for some more weight loss with this - Continue Metformin 1000mg  BID - Continue Basaglar 44u daily - advised titrate DOWN by 1 unit every 3-5 days if fasting AM sugar < 160 2. Encourage improved lifestyle - RESUME DM Diet - low carb, low sugar diet, reduce portion size, start regular exercise - exercise bike plan 3. Check CBG, bring log to next visit for review 4. Continue ACEi, Statin 5. Advised to schedule DM ophtho exam, send record 6. For Diabetic Shoes - recommend that she call her [facility] to request order 7. Follow-up 3 months labs, A1c physical - consider SGLT2 in future and further titrate down insulin      Relevant Medications   gabapentin (NEURONTIN) 100 MG capsule   Dulaglutide (TRULICITY) 1.5 DV/7.6HY SOPN   Other Relevant Orders   POCT HgB A1C (Completed)   Essential hypertension    Mildly elevated initial BP, repeat manual check improved. - Home BP readings none available       Plan:  1. Continue current BP regimen - Lisinopril 20mg  daily 2. Encourage improved lifestyle - low sodium diet, regular exercise (start improving) 3. Start monitor BP outside office, bring readings to next visit, if persistently >140/90 or new symptoms notify office sooner 4. Follow-up 3 months annual + labs      Morbid obesity (HCC)    Worsening weight gain, +15-18 lbs in past 3-4 months, worse lifestyle diet / limited exercise Morbid obesity based on co-morbidities with DM, HTN, HLD BMI >36 but < 40  Encouraged weight loss with lifestyle changes diet, reduce portion, handouts given, start exercise regular, goals wt loss 1 lb or more weekly Increase Trulicity to max dose 1.5 weekly extra wt loss Future consider SGLT2 if need for DM      Relevant Medications   Dulaglutide (TRULICITY) 1.5 WV/3.7TG SOPN   Polyneuropathy  Stable, chronic problem, now with improved A1c control but recent worsening 8 to 10 Has stocking / glove distribution as well. INCREASE Gabapentin 100mg  titration instructions - now 100mg  AM and 200mg  PM      Relevant Medications   gabapentin (NEURONTIN) 100 MG capsule      Meds ordered this encounter  Medications  . Dulaglutide (TRULICITY) 1.5 JO/8.3GP SOPN    Sig: Inject 1.5 mg into the skin once a week.    Dispense:  4 pen    Refill:  11    Dose increase from 0.75 now at 1.5 weekly    Follow up plan: Return in about 3 months (around 08/08/2017) for Annual Physical (DM update).  Future orders placed for 07/2017  [name], [facility] 05/10/2017, 1:10 PM

## 2017-05-10 NOTE — Patient Instructions (Addendum)
Thank you for coming to the office today.  1. A1c 10.0, increased from 8.4, not quite as elevated as >95  INCREASE Trulicity to 1.5 mg injection once weekly - finish your previous pen dose then start new one  May reduce Basaglar insulin from 44 units daily in morning down by 1 unit if morning fasting < 160 for a few readings, goal to reduce down to 40 units or less possibly 30s   For hot flashes  May try Black Cohosh herbal supplement over the counter  2.   Increase Gabapentin from 1 capsule in evening to TWO capsules in evening, continue 1 in morning  Recommend home exercise bike 49min most days  3.  Your provider would like to you have your annual eye exam. Please contact your current eye doctor or here are some good options for you to contact.    [facility] [address] [place], [place] [address] Phone: [phone] [email or web address]  4. Call Podiatry to ask about rx Diabetic Shoes  Cherrie Gauze, [facility],  [id number]  [phone]   Please schedule a Follow-up Appointment to: Return in about 3 months (around 08/08/2017) for Annual Physical (DM update).   If you have any other questions or concerns, please feel free to call the office or send a message through [place]. You may also schedule an earlier appointment if necessary.  Additionally, you may be receiving a survey about your experience at our office within a few days to 1 week by e-mail or mail. We value your feedback.  [name], DO [place]

## 2017-06-28 DIAGNOSIS — F251 Schizoaffective disorder, depressive type: Secondary | ICD-10-CM | POA: Diagnosis not present

## 2017-06-29 ENCOUNTER — Telehealth: Payer: Self-pay

## 2017-06-29 NOTE — Telephone Encounter (Signed)
Reviewed this update, understand the problem with pharmacy filling 0.75 instead of the rx 1.5, unfortunately patient was not aware as well.  Will proceed with current dose 0.75 as she has this existing rx until she is due for next fill then start 1.5 mg weekly - will stay tuned, and consider her A1c accordingly.  [name], [facility] 06/29/2017, 12:32 PM

## 2017-06-29 NOTE — Telephone Encounter (Signed)
I called [facility] and spoke with Ronalee Belts, Pharmacist who explained to me that the patient picked up the Trulicity .75mg  and have picked up that dosage for the last 6 mths. They have never filled the prescription for 1.5mg , but admits they have the prescription on fill from January. The pharmacist discontinued the .75mg  so that this will not happen again, but he states that her insurance want cover a new script this earlier.

## 2017-07-05 DIAGNOSIS — L02416 Cutaneous abscess of left lower limb: Secondary | ICD-10-CM | POA: Diagnosis not present

## 2017-07-08 ENCOUNTER — Emergency Department
Admission: EM | Admit: 2017-07-08 | Discharge: 2017-07-08 | Disposition: A | Discharge status: Home / Self Care | Payer: Medicare Other | Attending: Emergency Medicine | Admitting: Emergency Medicine

## 2017-07-08 ENCOUNTER — Other Ambulatory Visit: Payer: Self-pay

## 2017-07-08 ENCOUNTER — Encounter: Payer: Self-pay | Admitting: Emergency Medicine

## 2017-07-08 DIAGNOSIS — L0291 Cutaneous abscess, unspecified: Secondary | ICD-10-CM

## 2017-07-08 DIAGNOSIS — L02214 Cutaneous abscess of groin: Secondary | ICD-10-CM | POA: Diagnosis not present

## 2017-07-08 DIAGNOSIS — L02416 Cutaneous abscess of left lower limb: Secondary | ICD-10-CM | POA: Diagnosis not present

## 2017-07-08 DIAGNOSIS — R2242 Localized swelling, mass and lump, left lower limb: Secondary | ICD-10-CM | POA: Diagnosis present

## 2017-07-08 DIAGNOSIS — I10 Essential (primary) hypertension: Secondary | ICD-10-CM | POA: Insufficient documentation

## 2017-07-08 DIAGNOSIS — E1142 Type 2 diabetes mellitus with diabetic polyneuropathy: Secondary | ICD-10-CM | POA: Insufficient documentation

## 2017-07-08 DIAGNOSIS — Z87891 Personal history of nicotine dependence: Secondary | ICD-10-CM | POA: Insufficient documentation

## 2017-07-08 DIAGNOSIS — Z79899 Other long term (current) drug therapy: Secondary | ICD-10-CM | POA: Diagnosis not present

## 2017-07-08 LAB — CBC WITH DIFFERENTIAL/PLATELET
Basophils Absolute: 0.1 10*3/uL (ref 0–0.1)
Basophils Relative: 1 %
Eosinophils Absolute: 0.2 10*3/uL (ref 0–0.7)
Eosinophils Relative: 2 %
HCT: 32.5 % — ABNORMAL LOW (ref 35.0–47.0)
Hemoglobin: 11 g/dL — ABNORMAL LOW (ref 12.0–16.0)
Lymphocytes Relative: 22 %
Lymphs Abs: 2.3 10*3/uL (ref 1.0–3.6)
MCH: 29.1 pg (ref 26.0–34.0)
MCHC: 33.9 g/dL (ref 32.0–36.0)
MCV: 85.9 fL (ref 80.0–100.0)
Monocytes Absolute: 0.7 10*3/uL (ref 0.2–0.9)
Monocytes Relative: 7 %
Neutro Abs: 7 10*3/uL — ABNORMAL HIGH (ref 1.4–6.5)
Neutrophils Relative %: 68 %
Platelets: 321 10*3/uL (ref 150–440)
RBC: 3.78 MIL/uL — ABNORMAL LOW (ref 3.80–5.20)
RDW: 15.8 % — ABNORMAL HIGH (ref 11.5–14.5)
WBC: 10.3 10*3/uL (ref 3.6–11.0)

## 2017-07-08 LAB — BASIC METABOLIC PANEL
Anion gap: 11 (ref 5–15)
BUN: 16 mg/dL (ref 6–20)
CO2: 22 mmol/L (ref 22–32)
Calcium: 9.3 mg/dL (ref 8.9–10.3)
Chloride: 101 mmol/L (ref 101–111)
Creatinine, Ser: 0.81 mg/dL (ref 0.44–1.00)
GFR calc Af Amer: >60 mL/min (ref >60)
GFR calc non Af Amer: >60 mL/min (ref >60)
Glucose, Bld: 329 mg/dL — ABNORMAL HIGH (ref 65–99)
Potassium: 4.6 mmol/L (ref 3.5–5.1)
Sodium: 134 mmol/L — ABNORMAL LOW (ref 135–145)

## 2017-07-08 MED ORDER — CLINDAMYCIN HCL 150 MG PO CAPS: 300.0000 mg | ORAL_CAPSULE | Freq: Three times a day (TID) | ORAL | 0 refills | Status: DC

## 2017-07-08 MED ORDER — CLINDAMYCIN PHOSPHATE 600 MG/50ML IV SOLN
600.0000 mg | Freq: Once | INTRAVENOUS | Status: AC
  Administered 2017-07-08: 600 mg via INTRAVENOUS
  Filled 2017-07-08: qty 50

## 2017-07-08 NOTE — Discharge Instructions (Signed)
Return to the emergency department tomorrow if you have not greatly improved.  Follow-up with your doctor on Monday or Tuesday if it is slightly better but not completely healed.  Use the new medication as prescribed.  Discard the doxycycline.  Ask your regular doctor about anemia.

## 2017-07-08 NOTE — ED Triage Notes (Signed)
Pt to ed with c/o left groin area ? Abscess.  Pt states she was seen on Wednesday for same.  Reports she wants someone here at ED to check it out.

## 2017-07-08 NOTE — ED Provider Notes (Signed)
[facility] Emergency Department Provider Note  ____________________________________________   First MD Initiated Contact with Patient 07/08/17 1315     (approximate)  I have reviewed the triage vital signs and the nursing notes.   HISTORY  Chief Complaint Abscess    HPI [name] is a 52 y.o. female presents emergency department complaining of an abscess into the left groin/thigh area for 3-4 days.  She was seen 3 days ago at the [facility].  She was given a prescription for doxycycline.  She states the area has been draining and has an awful smell.  She just wanted to be checked out to make sure that the area is improving.  She denies fever or chills at this time.  She denies any vomiting or diarrhea  Past Medical History:  Diagnosis Date  . Depression   . Hypertension   . Schizo affective schizophrenia ([place])    abilify and pazil    Patient Active Problem List   Diagnosis Date Noted  . Morbid obesity ([place]) 05/10/2017  . Long-term use of high-risk medication 05/10/2017  . Hyperlipidemia associated with type 2 diabetes mellitus ([place]) 06/27/2016  . Hammer toe of second toe of right foot 04/20/2016  . Essential hypertension 06/29/2015  . Schizoaffective disorder ([place]) 06/29/2015  . DM (diabetes mellitus), type 2, 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[place]) 70/78/6754  . Polyneuropathy 04/02/2014    Past Surgical History:  Procedure Laterality Date  . CHOLECYSTECTOMY    . IRRIGATION AND DEBRIDEMENT FOOT Right 02/26/2016   Procedure: RIGHT 2ND TOE DEBRIDEMENT;  Surgeon: Samara Deist, DPM;  Location: [facility] ORS;  Service: Podiatry;  Laterality: Right;    Prior to Admission medications   Medication Sig Start Date End Date Taking? Authorizing Provider  ARIPiprazole (ABILIFY) 10 MG tablet Take 10 mg by mouth daily.    [provider]  atorvastatin (LIPITOR) 40 MG tablet Take 1 tablet (40 mg total) by mouth at bedtime.  01/18/17   Karamalegos, Devonne Doughty, DO  Blood Glucose Monitoring Suppl (ONE TOUCH ULTRA SYSTEM KIT) w/Device KIT 1 kit by Does not apply route once. 08/20/15   Arlis Porta., MD  clindamycin (CLEOCIN) 150 MG capsule Take 2 capsules (300 mg total) by mouth 3 (three) times daily. 07/08/17   Fisher, Linden Dolin, PA-C  Dulaglutide (TRULICITY) 1.5 GB/2.0FE SOPN Inject 1.5 mg into the skin once a week. 05/10/17   Karamalegos, Devonne Doughty, DO  gabapentin (NEURONTIN) 100 MG capsule Take 1 in AM and 2 in PM 05/10/17   Karamalegos, Devonne Doughty, DO  glucose blood test strip Check blood sugar up to 3 times daily 06/27/16   Parks Ranger, Devonne Doughty, DO  Insulin Glargine (BASAGLAR KWIKPEN) 100 UNIT/ML SOPN Inject 0.4 mLs (40 Units total) into the skin daily. May adjust dose as advised. Take once daily in morning. 06/28/16   Karamalegos, Devonne Doughty, DO  Insulin Pen Needle (B-D UF III MINI PEN NEEDLES) 31G X 5 MM MISC 1 each by Does not apply route daily. 09/02/16   Karamalegos, Devonne Doughty, DO  lisinopril (PRINIVIL,ZESTRIL) 20 MG tablet Take 1 tablet (20 mg total) by mouth daily. 10/11/16   Parks Ranger, Devonne Doughty, DO  metFORMIN (GLUCOPHAGE) 1000 MG tablet Take 1 tablet (1,000 mg total) by mouth 2 (two) times daily with a meal. 11/08/16   Karamalegos, Devonne Doughty, DO  mupirocin ointment (BACTROBAN) 2 %  04/26/17   [provider]  PARoxetine (PAXIL) 20 MG tablet Take 1 tablet (20 mg total)  by mouth daily. Prescribed by [facility] 10/11/16   [name], DO    Allergies Patient has no known allergies.  Family History  Problem Relation Age of Onset  . Breast cancer Cousin        maternal cousin    Social History Social History   Tobacco Use  . Smoking status: Former Smoker    Packs/day: 3.00    Years: 30.00    Pack years: 90.00    Last attempt to quit: 05/02/2009    Years since quitting: 8.1  . Smokeless tobacco: Never Used  Substance Use Topics  . Alcohol use: No  . Drug use: No     Review of Systems  Constitutional: No fever/chills Eyes: No visual changes. ENT: No sore throat. Respiratory: Denies cough Genitourinary: Negative for dysuria. Musculoskeletal: Negative for back pain. Skin: Negative for rash.  Positive for abscess with drainage    ____________________________________________   PHYSICAL EXAM:  VITAL SIGNS: ED Triage Vitals [07/08/17 1134]  Enc Vitals Group     BP (!) 180/102     Pulse Rate (!) 105     Resp 18     Temp 97.9 F (36.6 C)     Temp Source Oral     SpO2 100 %     Weight 260 lb (117.9 kg)     Height      Head Circumference      Peak Flow      Pain Score 2     Pain Loc      Pain Edu?      Excl. in [place]?     Constitutional: Alert and oriented. Well appearing and in no acute distress. Eyes: Conjunctivae are normal.  Head: Atraumatic. Nose: No congestion/rhinnorhea. Mouth/Throat: Mucous membranes are moist.   Cardiovascular: Normal rate, regular rhythm.  Heart sounds are normal Respiratory: Normal respiratory effort.  No retractions, lungs clear to auscultation GU: deferred Musculoskeletal: FROM all extremities, warm and well perfused Neurologic:  Normal speech and language.  Skin:  Skin is warm, dry.  There is a large abscess on the left upper thigh that is currently open and draining.  The area is hard and indurated.  There is no fluctuance noted.  Neurovascular is intact Psychiatric: Mood and affect are normal. Speech and behavior are normal.  ____________________________________________   LABS (all labs ordered are listed, but only abnormal results are displayed)  Labs Reviewed  CBC WITH DIFFERENTIAL/PLATELET - Abnormal; Notable for the following components:      Result Value   RBC 3.78 (*)    Hemoglobin 11.0 (*)    HCT 32.5 (*)    RDW 15.8 (*)    Neutro Abs 7.0 (*)    All other components within normal limits  BASIC METABOLIC PANEL - Abnormal; Notable for the following components:   Sodium 134 (*)     Glucose, Bld 329 (*)    All other components within normal limits   ____________________________________________   ____________________________________________  RADIOLOGY    ____________________________________________   PROCEDURES  Procedure(s) performed: Saline lock, clindamycin 600 mg IV  Procedures    ____________________________________________   INITIAL IMPRESSION / ASSESSMENT AND PLAN / ED COURSE  Pertinent labs & imaging results that were available during my care of the patient were reviewed by me and considered in my medical decision making (see chart for details).  Patient is a 52 year old diabetic female presents emergency department with an abscess into the left groin and upper thigh.  She is  currently been on doxycycline for 2 days.  She states there is been mild improvement but she does not feel like it is cleared up enough.  On physical exam the abscess is very large it is 7 inches x 4 inches in the left upper thigh.  It is actively draining.  The area is hard and indurated.  Patient is afebrile.  Clindamycin 600 mg IV.  CBC was ordered  And wbc  is normal.  The met b is normal.  Patient was instructed to discontinue the doxycycline.  She is to start the clindamycin 300 mg 3 times a day.  If she is not greatly improved tomorrow she should return to the emergency department.  If she is worsening she should return earlier.  If it is about the same or has some improvement she can follow-up with her regular doctor on Monday.  The patient states she understands.  She is to have her doctor reevaluate the anemia noted on the CBC patient states she understands and was discharged in stable condition     As part of my medical decision making, I reviewed the following data within the [name] notes reviewed and incorporated, Labs reviewed CBC with normal white count, positive for anemia, met B with glucose of 329, Notes from prior ED visits and  [place] Controlled Substance Database  ____________________________________________   FINAL CLINICAL IMPRESSION(S) / ED DIAGNOSES  Final diagnoses:  Abscess      NEW MEDICATIONS STARTED DURING THIS VISIT:  Discharge Medication List as of 07/08/2017  2:54 PM    START taking these medications   Details  clindamycin (CLEOCIN) 150 MG capsule Take 2 capsules (300 mg total) by mouth 3 (three) times daily., Starting Sat 07/08/2017, Print         Note:  This document was prepared using Dragon voice recognition software and may include unintentional dictation errors.    Versie Starks, PA-C 07/08/17 Abel Presto, MD 07/12/17 2227

## 2017-07-08 NOTE — ED Notes (Signed)
See triage note  States she developed a possible abscess area to left groin/thigh  About 3-4 days ago  Was seen 3 days ago and placed on antibiotics  States she is concerned b/c area is still drainaing

## 2017-08-15 ENCOUNTER — Ambulatory Visit: Payer: Medicare Other

## 2017-08-16 ENCOUNTER — Other Ambulatory Visit: Payer: Medicare Other

## 2017-08-22 ENCOUNTER — Ambulatory Visit: Payer: Medicare Other

## 2017-08-23 ENCOUNTER — Encounter: Payer: Medicare Other | Admitting: Family Medicine

## 2017-09-10 ENCOUNTER — Other Ambulatory Visit: Payer: Self-pay | Admitting: Family Medicine

## 2017-09-10 DIAGNOSIS — E1165 Type 2 diabetes mellitus with hyperglycemia: Principal | ICD-10-CM

## 2017-09-10 DIAGNOSIS — Z794 Long term (current) use of insulin: Principal | ICD-10-CM

## 2017-09-10 DIAGNOSIS — IMO0001 Reserved for inherently not codable concepts without codable children: Secondary | ICD-10-CM

## 2017-09-20 DIAGNOSIS — F251 Schizoaffective disorder, depressive type: Secondary | ICD-10-CM | POA: Diagnosis not present

## 2017-10-01 DIAGNOSIS — N390 Urinary tract infection, site not specified: Secondary | ICD-10-CM | POA: Diagnosis not present

## 2017-10-01 DIAGNOSIS — R319 Hematuria, unspecified: Secondary | ICD-10-CM | POA: Diagnosis not present

## 2017-10-01 DIAGNOSIS — R300 Dysuria: Secondary | ICD-10-CM | POA: Diagnosis not present

## 2017-10-24 ENCOUNTER — Encounter: Payer: Self-pay | Admitting: Family Medicine

## 2017-10-24 ENCOUNTER — Other Ambulatory Visit: Payer: Self-pay | Admitting: Family Medicine

## 2017-10-24 ENCOUNTER — Ambulatory Visit (INDEPENDENT_AMBULATORY_CARE_PROVIDER_SITE_OTHER): Payer: Medicare Other | Admitting: Family Medicine

## 2017-10-24 ENCOUNTER — Ambulatory Visit (INDEPENDENT_AMBULATORY_CARE_PROVIDER_SITE_OTHER): Payer: Medicare Other

## 2017-10-24 VITALS — BP 142/62 | HR 62 | Temp 98.0°F | Resp 17 | Ht 71.0 in | Wt 260.0 lb

## 2017-10-24 VITALS — BP 142/62 | HR 62 | Temp 98.0°F | Resp 16 | Ht 70.5 in | Wt 260.0 lb

## 2017-10-24 DIAGNOSIS — I10 Essential (primary) hypertension: Secondary | ICD-10-CM

## 2017-10-24 DIAGNOSIS — E1169 Type 2 diabetes mellitus with other specified complication: Secondary | ICD-10-CM

## 2017-10-24 DIAGNOSIS — E1149 Type 2 diabetes mellitus with other diabetic neurological complication: Secondary | ICD-10-CM | POA: Diagnosis not present

## 2017-10-24 DIAGNOSIS — Z23 Encounter for immunization: Secondary | ICD-10-CM

## 2017-10-24 DIAGNOSIS — Z1239 Encounter for other screening for malignant neoplasm of breast: Secondary | ICD-10-CM

## 2017-10-24 DIAGNOSIS — E785 Hyperlipidemia, unspecified: Secondary | ICD-10-CM

## 2017-10-24 DIAGNOSIS — Z1231 Encounter for screening mammogram for malignant neoplasm of breast: Secondary | ICD-10-CM | POA: Diagnosis not present

## 2017-10-24 DIAGNOSIS — Z0000 Encounter for general adult medical examination without abnormal findings: Secondary | ICD-10-CM | POA: Diagnosis not present

## 2017-10-24 DIAGNOSIS — G629 Polyneuropathy, unspecified: Secondary | ICD-10-CM

## 2017-10-24 DIAGNOSIS — E1165 Type 2 diabetes mellitus with hyperglycemia: Principal | ICD-10-CM

## 2017-10-24 DIAGNOSIS — F251 Schizoaffective disorder, depressive type: Secondary | ICD-10-CM

## 2017-10-24 DIAGNOSIS — IMO0002 Reserved for concepts with insufficient information to code with codable children: Secondary | ICD-10-CM

## 2017-10-24 DIAGNOSIS — Z79899 Other long term (current) drug therapy: Secondary | ICD-10-CM

## 2017-10-24 DIAGNOSIS — R110 Nausea: Secondary | ICD-10-CM

## 2017-10-24 DIAGNOSIS — Z1159 Encounter for screening for other viral diseases: Secondary | ICD-10-CM | POA: Diagnosis not present

## 2017-10-24 MED ORDER — LISINOPRIL 20 MG PO TABS: 20.0000 mg | ORAL_TABLET | Freq: Every day | ORAL | 3 refills | Status: DC

## 2017-10-24 MED ORDER — ONDANSETRON HCL 4 MG PO TABS: 4.0000 mg | ORAL_TABLET | Freq: Three times a day (TID) | ORAL | 1 refills | Status: DC | PRN

## 2017-10-24 NOTE — Patient Instructions (Addendum)
Thank you for coming to the office today.  Try to improve diet and reduce portions  Take nausea medicine as needed  Continue with current diabetes medicines  Take Zofran as needed for nausea  If fasting sugar in morning is >170, may increase Basaglar from 42 units up by 2 units every 1 week., stop around 50 units if needed  For Mammogram screening for breast cancer   Call the [facility] below anytime to schedule your own appointment now that order has been placed.  [facility] [place], [place] [address] Phone: [phone]  Your provider would like to you have your annual eye exam. Please contact your current eye doctor or here are some good options for you to contact.   [facility]   Address: [address] [place], [place] [address] Phone: [phone]  Website: [email or web address] [address], [place], [place] [address] Phone: [phone] [email or web address]  [facility]  Address: [place], [place], [place] [address] Phone: [phone]   [facility] [address] [place], [place] [address] Phone: [phone]  [facility] Address: [place], [place], [place] [address]  Phone: [phone]   DUE for [place] (no food or drink after midnight before the lab appointment, only water or coffee without cream/sugar on the morning of)  SCHEDULE "Lab Only" visit in the morning at the clinic for lab draw in 1-2 WEEKS   - Make sure Lab Only appointment is at about 1 week before your next appointment, so that results will be available  For Lab Results, once available within 2-3 days of blood draw, you can can log in to MyChart online to view your results and a brief explanation. Also, we can discuss results at next follow-up visit.   Please schedule a Follow-up Appointment to: Return in about 3 weeks (around 11/14/2017) for  Diabetic follow-up lab results (40 min apt / phys slot).  If you have any other questions or concerns, please feel free to call the office or send a message through [place]. You may also schedule an earlier appointment if necessary.  Additionally, you may be receiving a survey about your experience at our office within a few days to 1 week by e-mail or mail. We value your feedback.  [name], DO [place]

## 2017-10-24 NOTE — Patient Instructions (Addendum)
[name] , Thank you for taking time to come for your Medicare Wellness Visit. I appreciate your ongoing commitment to your health goals. Please review the following plan we discussed and let me know if I can assist you in the future.   Screening recommendations/referrals: Colonoscopy: completed 09/17/2015 Mammogram: due now, Please call [phone] to schedule your mammogram.  Bone Density: not indicated Recommended yearly ophthalmology/optometry visit for glaucoma screening and checkup Recommended yearly dental visit for hygiene and checkup  Vaccinations: Influenza vaccine: due 12/2017 Pneumococcal vaccine: done today, due again at age 89 Tdap vaccine: up to date Shingles vaccine: shingrix eligible, check with your insurance company for coverage     Advanced directives: Advance directive discussed with you today. I have provided a copy for you to complete at home and have notarized. Once this is complete please bring a copy in to our office so we can scan it into your chart.  Conditions/risks identified: Recommend continue drinking at least 6-8 glasses of water a day   Next appointment: Follow up in one year for your annual wellness exam.   Preventive Care 40-64 Years, Female Preventive care refers to lifestyle choices and visits with your health care provider that can promote health and wellness. What does preventive care include?  A yearly physical exam. This is also called an annual well check.  Dental exams once or twice a year.  Routine eye exams. Ask your health care provider how often you should have your eyes checked.  Personal lifestyle choices, including:  Daily care of your teeth and gums.  Regular physical activity.  Eating a healthy diet.  Avoiding tobacco and drug use.  Limiting alcohol use.  Practicing safe sex.  Taking low-dose aspirin daily starting at age 30.  Taking vitamin and mineral supplements as recommended by your health care  provider. What happens during an annual well check? The services and screenings done by your health care provider during your annual well check will depend on your age, overall health, lifestyle risk factors, and family history of disease. Counseling  Your health care provider may ask you questions about your:  Alcohol use.  Tobacco use.  Drug use.  Emotional well-being.  Home and relationship well-being.  Sexual activity.  Eating habits.  Work and work Statistician.  Method of birth control.  Menstrual cycle.  Pregnancy history. Screening  You may have the following tests or measurements:  Height, weight, and BMI.  Blood pressure.  Lipid and cholesterol levels. These may be checked every 5 years, or more frequently if you are over 50 years old.  Skin check.  Lung cancer screening. You may have this screening every year starting at age 34 if you have a 30-pack-year history of smoking and currently smoke or have quit within the past 15 years.  Fecal occult blood test (FOBT) of the stool. You may have this test every year starting at age 17.  Flexible sigmoidoscopy or colonoscopy. You may have a sigmoidoscopy every 5 years or a colonoscopy every 10 years starting at age 43.  Hepatitis C blood test.  Hepatitis B blood test.  Sexually transmitted disease (STD) testing.  Diabetes screening. This is done by checking your blood sugar (glucose) after you have not eaten for a while (fasting). You may have this done every 1-3 years.  Mammogram. This may be done every 1-2 years. Talk to your health care provider about when you should start having regular mammograms. This may depend on whether you have a family  history of breast cancer.  BRCA-related cancer screening. This may be done if you have a family history of breast, ovarian, tubal, or peritoneal cancers.  Pelvic exam and Pap test. This may be done every 3 years starting at age 39. Starting at age 12, this may be  done every 5 years if you have a Pap test in combination with an HPV test.  Bone density scan. This is done to screen for osteoporosis. You may have this scan if you are at high risk for osteoporosis. Discuss your test results, treatment options, and if necessary, the need for more tests with your health care provider. Vaccines  Your health care provider may recommend certain vaccines, such as:  Influenza vaccine. This is recommended every year.  Tetanus, diphtheria, and acellular pertussis (Tdap, Td) vaccine. You may need a Td booster every 10 years.  Zoster vaccine. You may need this after age 31.  Pneumococcal 13-valent conjugate (PCV13) vaccine. You may need this if you have certain conditions and were not previously vaccinated.  Pneumococcal polysaccharide (PPSV23) vaccine. You may need one or two doses if you smoke cigarettes or if you have certain conditions. Talk to your health care provider about which screenings and vaccines you need and how often you need them. This information is not intended to replace advice given to you by your health care provider. Make sure you discuss any questions you have with your health care provider. Document Released: 05/15/2015 Document Revised: 01/06/2016 Document Reviewed: 02/17/2015 Elsevier Interactive Patient Education  2017 [place] Prevention in the Home Falls can cause injuries. They can happen to people of all ages. There are many things you can do to make your home safe and to help prevent falls. What can I do on the outside of my home?  Regularly fix the edges of walkways and driveways and fix any cracks.  Remove anything that might make you trip as you walk through a door, such as a raised step or threshold.  Trim any bushes or trees on the path to your home.  Use bright outdoor lighting.  Clear any walking paths of anything that might make someone trip, such as rocks or tools.  Regularly check to see if  handrails are loose or broken. Make sure that both sides of any steps have handrails.  Any raised decks and porches should have guardrails on the edges.  Have any leaves, snow, or ice cleared regularly.  Use sand or salt on walking paths during winter.  Clean up any spills in your garage right away. This includes oil or grease spills. What can I do in the bathroom?  Use night lights.  Install grab bars by the toilet and in the tub and shower. Do not use towel bars as grab bars.  Use non-skid mats or decals in the tub or shower.  If you need to sit down in the shower, use a plastic, non-slip stool.  Keep the floor dry. Clean up any water that spills on the floor as soon as it happens.  Remove soap buildup in the tub or shower regularly.  Attach bath mats securely with double-sided non-slip rug tape.  Do not have throw rugs and other things on the floor that can make you trip. What can I do in the bedroom?  Use night lights.  Make sure that you have a light by your bed that is easy to reach.  Do not use any sheets or blankets that are too  big for your bed. They should not hang down onto the floor.  Have a firm chair that has side arms. You can use this for support while you get dressed.  Do not have throw rugs and other things on the floor that can make you trip. What can I do in the kitchen?  Clean up any spills right away.  Avoid walking on wet floors.  Keep items that you use a lot in easy-to-reach places.  If you need to reach something above you, use a strong step stool that has a grab bar.  Keep electrical cords out of the way.  Do not use floor polish or wax that makes floors slippery. If you must use wax, use non-skid floor wax.  Do not have throw rugs and other things on the floor that can make you trip. What can I do with my stairs?  Do not leave any items on the stairs.  Make sure that there are handrails on both sides of the stairs and use them. Fix  handrails that are broken or loose. Make sure that handrails are as long as the stairways.  Check any carpeting to make sure that it is firmly attached to the stairs. Fix any carpet that is loose or worn.  Avoid having throw rugs at the top or bottom of the stairs. If you do have throw rugs, attach them to the floor with carpet tape.  Make sure that you have a light switch at the top of the stairs and the bottom of the stairs. If you do not have them, ask someone to add them for you. What else can I do to help prevent falls?  Wear shoes that:  Do not have high heels.  Have rubber bottoms.  Are comfortable and fit you well.  Are closed at the toe. Do not wear sandals.  If you use a stepladder:  Make sure that it is fully opened. Do not climb a closed stepladder.  Make sure that both sides of the stepladder are locked into place.  Ask someone to hold it for you, if possible.  Clearly mark and make sure that you can see:  Any grab bars or handrails.  First and last steps.  Where the edge of each step is.  Use tools that help you move around (mobility aids) if they are needed. These include:  Canes.  Walkers.  Scooters.  Crutches.  Turn on the lights when you go into a dark area. Replace any light bulbs as soon as they burn out.  Set up your furniture so you have a clear path. Avoid moving your furniture around.  If any of your floors are uneven, fix them.  If there are any pets around you, be aware of where they are.  Review your medicines with your doctor. Some medicines can make you feel dizzy. This can increase your chance of falling. Ask your doctor what other things that you can do to help prevent falls. This information is not intended to replace advice given to you by your health care provider. Make sure you discuss any questions you have with your health care provider. Document Released: 02/12/2009 Document Revised: 09/24/2015 Document Reviewed:  05/23/2014 Elsevier Interactive Patient Education  2017 [place].   Pneumococcal Polysaccharide Vaccine: What You Need to Know 1. Why get vaccinated? Vaccination can protect older adults (and some children and younger adults) from pneumococcal disease. Pneumococcal disease is caused by bacteria that can spread from person to person through  close contact. It can cause ear infections, and it can also lead to more serious infections of the:  Lungs (pneumonia),  Blood (bacteremia), and  Covering of the brain and spinal cord (meningitis). Meningitis can cause deafness and brain damage, and it can be fatal.  Anyone can get pneumococcal disease, but children under 38 years of age, people with certain medical conditions, adults over 7 years of age, and cigarette smokers are at the highest risk. About 18,000 older adults die each year from pneumococcal disease in the [place]. Treatment of pneumococcal infections with penicillin and other drugs used to be more effective. But some strains of the disease have become resistant to these drugs. This makes prevention of the disease, through vaccination, even more important. 2. Pneumococcal polysaccharide vaccine (PPSV23) Pneumococcal polysaccharide vaccine (PPSV23) protects against 23 types of pneumococcal bacteria. It will not prevent all pneumococcal disease. PPSV23 is recommended for:  All adults 3 years of age and older,  Anyone 2 through 54 years of age with certain long-term health problems,  Anyone 2 through 53 years of age with a weakened immune system,  Adults 26 through 54 years of age who smoke cigarettes or have asthma.  Most people need only one dose of PPSV. A second dose is recommended for certain high-risk groups. People 38 and older should get a dose even if they have gotten one or more doses of the vaccine before they turned 65. Your healthcare provider can give you more information about these recommendations. Most  healthy adults develop protection within 2 to 3 weeks of getting the shot. 3. Some people should not get this vaccine  Anyone who has had a life-threatening allergic reaction to PPSV should not get another dose.  Anyone who has a severe allergy to any component of PPSV should not receive it. Tell your provider if you have any severe allergies.  Anyone who is moderately or severely ill when the shot is scheduled may be asked to wait until they recover before getting the vaccine. Someone with a mild illness can usually be vaccinated.  Children less than 82 years of age should not receive this vaccine.  There is no evidence that PPSV is harmful to either a pregnant woman or to her fetus. However, as a precaution, women who need the vaccine should be vaccinated before becoming pregnant, if possible. 4. Risks of a vaccine reaction With any medicine, including vaccines, there is a chance of side effects. These are usually mild and go away on their own, but serious reactions are also possible. About half of people who get PPSV have mild side effects, such as redness or pain where the shot is given, which go away within about two days. Less than 1 out of 100 people develop a fever, muscle aches, or more severe local reactions. Problems that could happen after any vaccine:  People sometimes faint after a medical procedure, including vaccination. Sitting or lying down for about 15 minutes can help prevent fainting, and injuries caused by a fall. Tell your doctor if you feel dizzy, or have vision changes or ringing in the ears.  Some people get severe pain in the shoulder and have difficulty moving the arm where a shot was given. This happens very rarely.  Any medication can cause a severe allergic reaction. Such reactions from a vaccine are very rare, estimated at about 1 in a million doses, and would happen within a few minutes to a few hours after the vaccination. As with any  medicine, there is a very  remote chance of a vaccine causing a serious injury or death. The safety of vaccines is always being monitored. For more information, visit: http://www.aguilar.org/ 5. What if there is a serious reaction? What should I look for? Look for anything that concerns you, such as signs of a severe allergic reaction, very high fever, or unusual behavior. Signs of a severe allergic reaction can include hives, swelling of the face and throat, difficulty breathing, a fast heartbeat, dizziness, and weakness. These would usually start a few minutes to a few hours after the vaccination. What should I do? If you think it is a severe allergic reaction or other emergency that can't wait, call 9-1-1 or get to the nearest hospital. Otherwise, call your doctor. Afterward, the reaction should be reported to the Vaccine Adverse Event Reporting System (VAERS). Your doctor might file this report, or you can do it yourself through the VAERS web site at www.vaers.SamedayNews.es, or by calling 940-109-4790. VAERS does not give medical advice. 6. How can I learn more?  Ask your doctor. He or she can give you the vaccine package insert or suggest other sources of information.  Call your local or state health department.  Contact the Centers for Disease Control and Prevention (CDC): ? Call (629)468-2567 (1-800-CDC-INFO) or ? Visit CDC's website at http://hunter.com/ CDC Pneumococcal Polysaccharide Vaccine VIS (08/23/13) This information is not intended to replace advice given to you by your health care provider. Make sure you discuss any questions you have with your health care provider. Document Released: 02/13/2006 Document Revised: 01/07/2016 Document Reviewed: 01/07/2016 Elsevier Interactive Patient Education  2017 [employer].

## 2017-10-24 NOTE — Addendum Note (Signed)
Addended by: [name] on: 10/24/2017 11:59 PM   Modules accepted: Level of Service

## 2017-10-24 NOTE — Progress Notes (Signed)
Subjective:    Patient ID: [name], female    DOB: 05-27-1965, 52 y.o.   MRN: [id number]  [name] is a 52 y.o. female presenting on 10/24/2017 for Diabetes   Patient is accompanied by her Mother, Clarise Cruz, who provides additional history.  Note patient missed and had to re-schedule last visit and lab draw from 07/2017.  She was seen today also by Tyler Aas LPN for AMW  HPI   CHRONIC DM, Type 2/ Complication with DMneuropathy - Last visit with me 05/2017 for DM, she had worsening A1c control up to 10 from prior 8.4, her trulicity was increased from 0.75 up to 1.5 see last note for background information - Interval update: She does admits to poor adherence now with worsening diet and lifestyle, she is concern with elevated sugars. She missed last apt and labs  Today asking when can come back for labs, and med adjust She had some nausea on Trulicity initially but then improved still on higher dose, thinks may have some problem but not significant - CBGs: infrequent checking now avg >200 up to 350s Meds:Basaglar 42daily [place] daily AM, Trulicity 1.5mg  weekly (Friday), Metformin1000mg  BID Currently on ACEi Lifestyle: - Nausea after eat, most meals - worse after larger portions - worse after trulicity dosing then improve by end of week - Diet (admits poor diet) - Exercise (limited exercise) - Regarding DM NeuropathyAdmits numbness in feet, chronically, back on Gabapentin 100mg  BID some relief Still overdue for DM Eye Exam despite prior handout / referral Denies hypoglycemia, polyuria, visual changes  Depression screen [facility] 2/9 10/24/2017 05/10/2017 10/11/2016  Decreased Interest 0 0 1  Down, Depressed, Hopeless 0 0 0  PHQ - 2 Score 0 0 1  Altered sleeping - - 1  Tired, decreased energy - - 1  Change in appetite - - 1  Feeling bad or failure about yourself  - - 0  Trouble concentrating - - 0  Moving slowly or fidgety/restless - - 0  Suicidal thoughts - - 0    PHQ-9 Score - - 4    Social History   Tobacco Use  . Smoking status: Former Smoker    Packs/day: 3.00    Years: 30.00    Pack years: 90.00    Last attempt to quit: 05/02/2009    Years since quitting: 8.4  . Smokeless tobacco: Never Used  Substance Use Topics  . Alcohol use: No  . Drug use: No    Review of Systems Per HPI unless specifically indicated above     Objective:    BP (!) 142/62   Pulse 62   Temp 98 F (36.7 C) (Oral)   Resp 16   Ht 5' 10.5" (1.791 m)   Wt 260 lb (117.9 kg)   BMI 36.78 kg/m   Wt Readings from Last 3 Encounters:  10/24/17 260 lb (117.9 kg)  10/24/17 260 lb (117.9 kg)  07/08/17 260 lb (117.9 kg)    Physical Exam  Constitutional: She is oriented to person, place, and time. She appears well-developed and well-nourished. No distress.  Chronically ill comfortable, cooperative, obese  HENT:  Head: Normocephalic and atraumatic.  Mouth/Throat: Oropharynx is clear and moist.  Eyes: Conjunctivae are normal. Right eye exhibits no discharge. Left eye exhibits no discharge.  Neck: Normal range of motion. Neck supple. No thyromegaly present.  Cardiovascular: Normal rate, regular rhythm, normal heart sounds and intact distal pulses.  No murmur heard. Pulmonary/Chest: Effort normal and breath sounds normal. No  respiratory distress. She has no wheezes. She has no rales.  Musculoskeletal: Normal range of motion. She exhibits no edema.  Lymphadenopathy:    She has no cervical adenopathy.  Neurological: She is alert and oriented to person, place, and time.  Skin: Skin is warm and dry. No rash noted. She is not diaphoretic. No erythema.  Psychiatric: She has a normal mood and affect. Her behavior is normal.  Well groomed, good eye contact, normal speech and thoughts  Nursing note and vitals reviewed.  Results for orders placed or performed during the hospital encounter of 07/08/17  CBC with Differential  Result Value Ref Range   WBC 10.3 3.6 - 11.0  K/uL   RBC 3.78 (L) 3.80 - 5.20 MIL/uL   Hemoglobin 11.0 (L) 12.0 - 16.0 g/dL   HCT 32.5 (L) 35.0 - 47.0 %   MCV 85.9 80.0 - 100.0 fL   MCH 29.1 26.0 - 34.0 pg   MCHC 33.9 32.0 - 36.0 g/dL   RDW 15.8 (H) 11.5 - 14.5 %   Platelets 321 150 - 440 K/uL   Neutrophils Relative % 68 %   Neutro Abs 7.0 (H) 1.4 - 6.5 K/uL   Lymphocytes Relative 22 %   Lymphs Abs 2.3 1.0 - 3.6 K/uL   Monocytes Relative 7 %   Monocytes Absolute 0.7 0.2 - 0.9 K/uL   Eosinophils Relative 2 %   Eosinophils Absolute 0.2 0 - 0.7 K/uL   Basophils Relative 1 %   Basophils Absolute 0.1 0 - 0.1 K/uL  Basic metabolic panel  Result Value Ref Range   Sodium 134 (L) 135 - 145 mmol/L   Potassium 4.6 3.5 - 5.1 mmol/L   Chloride 101 101 - 111 mmol/L   CO2 22 22 - 32 mmol/L   Glucose, Bld 329 (H) 65 - 99 mg/dL   BUN 16 6 - 20 mg/dL   Creatinine, Ser 0.81 0.44 - 1.00 mg/dL   Calcium 9.3 8.9 - 10.3 mg/dL   GFR calc non Af Amer >60 >60 mL/min   GFR calc Af Amer >60 >60 mL/min   Anion gap 11 5 - 15      Assessment & Plan:   Problem List Items Addressed This Visit    DM (diabetes mellitus), type 2, 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[place]) - Primary Clinically worse DM control hyperglycemia Complicated with neuropathy Non adherence to lifestyle, seems adherent to meds Return within 1-2 week for all fasting labs and due A1c - adjust meds accordingly at future visit, discussed that if still poor DM control, next step may be referral to Endocrinology, otherwise limited ability to manage her DM here if cannot adhere to recommendations    Relevant Medications   lisinopril (PRINIVIL,ZESTRIL) 20 MG tablet   Essential hypertension Elevated BP Refill Lisinopril 20mg  daily   Relevant Medications   lisinopril (PRINIVIL,ZESTRIL) 20 MG tablet    Other Visit Diagnoses    Nausea       Relevant Medications   ondansetron (ZOFRAN) 4 MG tablet      Meds ordered this encounter  Medications  . ondansetron (ZOFRAN) 4 MG  tablet    Sig: Take 1 tablet (4 mg total) by mouth every 8 (eight) hours as needed for nausea or vomiting.    Dispense:  30 tablet    Refill:  1  . lisinopril (PRINIVIL,ZESTRIL) 20 MG tablet    Sig: Take 1 tablet (20 mg total) by mouth daily.    Dispense:  90 tablet  Refill:  3    Follow up plan: Return in about 3 weeks (around 11/14/2017) for Diabetic follow-up lab results (40 min apt / phys slot).  Future labs ordered for 11/07/17  [name], [facility] 10/24/2017, 11:44 PM

## 2017-10-24 NOTE — Progress Notes (Signed)
Subjective:   [name] is a 52 y.o. female who presents for an Initial Medicare Annual Wellness Visit.  Review of Systems      Cardiac Risk Factors include: diabetes mellitus;hypertension;obesity (BMI >30kg/m2);smoking/ tobacco exposure     Objective:    Today's Vitals   10/24/17 1315 10/24/17 1317  BP: (!) 142/62   Pulse: 62   Resp: 17   Temp: 98 F (36.7 C)   TempSrc: Oral   Weight: 260 lb (117.9 kg)   Height: 5' 11" (1.803 m)   PainSc:  8    Body mass index is 36.26 kg/m.  Advanced Directives 10/24/2017 02/26/2016 02/24/2016 06/24/2015 11/24/2014  Does Patient Have a Medical Advance Directive? _0   Would patient like information on creating a medical advance directive? Yes (MAU/Ambulatory/Procedural Areas - Information given) - Yes - Educational materials given - No - patient declined information    Current Medications (verified) Outpatient Encounter Medications as of 10/24/2017  Medication Sig  . ARIPiprazole (ABILIFY) 10 MG tablet Take 10 mg by mouth daily.  . B-D UF III MINI PEN NEEDLES 31G X 5 MM MISC USE AS DIRECTED  . Blood Glucose Monitoring Suppl (ONE TOUCH ULTRA SYSTEM KIT) w/Device KIT 1 kit by Does not apply route once.  . Dulaglutide (TRULICITY) 1.5 FW/2.6VZ SOPN Inject 1.5 mg into the skin once a week.  [name] glucose blood test strip Check blood sugar up to 3 times daily  . Insulin Glargine (BASAGLAR KWIKPEN) 100 UNIT/ML SOPN Inject 0.4 mLs (40 Units total) into the skin daily. May adjust dose as advised. Take once daily in morning.  [name] lisinopril (PRINIVIL,ZESTRIL) 20 MG tablet Take 1 tablet (20 mg total) by mouth daily.  . metFORMIN (GLUCOPHAGE) 1000 MG tablet Take 1 tablet (1,000 mg total) by mouth 2 (two) times daily with a meal.  . PARoxetine (PAXIL) 20 MG tablet Take 1 tablet (20 mg total) by mouth daily. Prescribed by [facility]  . atorvastatin (LIPITOR) 40 MG tablet Take 1 tablet (40 mg total) by mouth at bedtime.  . clindamycin  (CLEOCIN) 150 MG capsule Take 2 capsules (300 mg total) by mouth 3 (three) times daily.  [name] gabapentin (NEURONTIN) 100 MG capsule Take 1 in AM and 2 in PM  . mupirocin ointment (BACTROBAN) 2 %    No facility-administered encounter medications on file as of 10/24/2017.     Allergies (verified) Patient has no known allergies.   History: Past Medical History:  Diagnosis Date  . Depression   . Diabetes mellitus without complication ([place])   . Hyperlipidemia   . Hypertension   . Schizo affective schizophrenia ([place])    abilify and pazil   Past Surgical History:  Procedure Laterality Date  . CHOLECYSTECTOMY    . IRRIGATION AND DEBRIDEMENT FOOT Right 02/26/2016   Procedure: RIGHT 2ND TOE DEBRIDEMENT;  Surgeon: Samara Deist, DPM;  Location: [facility] ORS;  Service: Podiatry;  Laterality: Right;   Family History  Problem Relation Age of Onset  . Breast cancer Cousin        maternal cousin  . Hypertension Mother   . Hyperthyroidism Mother   . Diabetes Maternal Grandmother   . Hypertension Maternal Grandmother   . Cancer Maternal Grandmother        unknown type  . Diabetes Maternal Grandfather   . Hypertension Maternal Grandfather   . Diabetes Paternal Grandmother   . Hypertension Paternal Grandmother   . Diabetes Paternal Grandfather   . Hypertension Paternal  Grandfather    Social History   Socioeconomic History  . Marital status: Divorced    Spouse name: Not on file  . Number of children: Not on file  . Years of education: Not on file  . Highest education level: Not on file  Occupational History  . Not on file  Social Needs  . Financial resource strain: Not hard at all  . Food insecurity:    Worry: Never true    Inability: Never true  . Transportation needs:    Medical: No    Non-medical: No  Tobacco Use  . Smoking status: Former Smoker    Packs/day: 3.00    Years: 30.00    Pack years: 90.00    Last attempt to quit: 05/02/2009    Years since quitting: 8.4  . Smokeless  tobacco: Never Used  Substance and Sexual Activity  . Alcohol use: No  . Drug use: No  . Sexual activity: Never  Lifestyle  . Physical activity:    Days per week: 0 days    Minutes per session: 0 min  . Stress: Not at all  Relationships  . Social connections:    Talks on phone: More than three times a week    Gets together: More than three times a week    Attends religious service: More than 4 times per year    Active member of club or organization: No    Attends meetings of clubs or organizations: Never    Relationship status: Divorced  Other Topics Concern  . Not on file  Social History Narrative  . Not on file    Tobacco Counseling Counseling given: Not Answered   Clinical Intake:  Pre-visit preparation completed: Yes  Pain : 0-10 Pain Score: 8  Pain Type: Chronic pain Pain Location: Foot Pain Orientation: Left, Right Pain Descriptors / Indicators: Aching, Tingling Pain Onset: More than a month ago Pain Frequency: Constant     Nutritional Status: BMI > 30  Obese Nutritional Risks: Nausea/ vomitting/ diarrhea(nausea after eating ) Diabetes: Yes  How often do you need to have someone help you when you read instructions, pamphlets, or other written materials from your doctor or pharmacy?: 1 - Never What is the last grade level you completed in school?: 1 year college   Interpreter Needed?: No  Information entered by :: Tiffany Hill,LPN    Activities of Daily Living In your present state of health, do you have any difficulty performing the following activities: 10/24/2017  Hearing? Y  Vision? N  Difficulty concentrating or making decisions? N  Walking or climbing stairs? Y  Comment due to neuropathy in feet  Dressing or bathing? N  Doing errands, shopping? N  Preparing Food and eating ? N  Using the Toilet? N  In the past six months, have you accidently leaked urine? N  Do you have problems with loss of bowel control? N  Managing your Medications? N    Managing your Finances? N  Housekeeping or managing your Housekeeping? N  Some recent data might be hidden     Immunizations and Health Maintenance Immunization History  Administered Date(s) Administered  . Influenza,inj,Quad PF,6+ Mos 01/18/2017  . Pneumococcal Polysaccharide-23 10/24/2017   Health Maintenance Due  Topic Date Due  . Hepatitis C Screening  08/19/1965  . OPHTHALMOLOGY EXAM  02/16/1974  . PAP SMEAR  02/16/1985  . MAMMOGRAM  08/17/2017  . FOOT EXAM  10/11/2017    Patient Care Team: Karamalegos, [name] J, DO as PCP -   General (Family Medicine) Litz, Mariel, MD as Referring Physician (Psychiatry)  Indicate any recent Medical Services you may have received from other than [facility] providers in the past year (date may be approximate).     Assessment:   This is a routine wellness examination for [name].  Hearing/Vision screen Vision Screening Comments: Goes [facility]   Dietary issues and exercise activities discussed: Current Exercise Habits: The patient does not participate in regular exercise at present, Exercise limited by: None identified  Goals    . DIET - INCREASE WATER INTAKE     Recommend continue drinking at least 6-8 glasses of water a day       Depression Screen PHQ 2/9 Scores 10/24/2017 05/10/2017 10/11/2016 06/28/2016 06/29/2015  PHQ - 2 Score 0 0 1 0 0  PHQ- 9 Score - - 4 - -    Fall Risk Fall Risk  10/24/2017 05/10/2017 06/28/2016  Falls in the past year? No No No  Risk for fall due to : - - Other (Comment)  Risk for fall due to: Comment - - has neuropathy-both feet    Is the patient's home free of loose throw rugs in walkways, pet beds, electrical cords, etc?   yes      Grab bars in the bathroom? yes      Handrails on the stairs?   yes      Adequate lighting?   yes  Timed Get Up and Go Performed Completed in 8 seconds with no use of assistive devices, steady gait. No intervention needed at this time.   Cognitive Function:      6CIT Screen 10/24/2017  What Year? 0 points  What month? 0 points  What time? 0 points  Count back from 20 0 points  Months in reverse 0 points  Repeat phrase 0 points  Total Score 0    Screening Tests Health Maintenance  Topic Date Due  . Hepatitis C Screening  07/19/1965  . OPHTHALMOLOGY EXAM  02/16/1974  . PAP SMEAR  02/16/1985  . MAMMOGRAM  08/17/2017  . FOOT EXAM  10/11/2017  . HEMOGLOBIN A1C  11/07/2017  . INFLUENZA VACCINE  11/30/2017  . PNEUMOCOCCAL POLYSACCHARIDE VACCINE (2) 10/25/2022  . COLONOSCOPY  09/16/2025  . TETANUS/TDAP  09/16/2025  . HIV Screening  Completed    Qualifies for Shingles Vaccine? Yes, discussed shingrix vaccine   Cancer Screenings: Lung: Low Dose CT Chest recommended if Age 55-80 years, 30 pack-year currently smoking OR have quit w/in 15years. Patient does not qualify. Breast: Up to date on Mammogram? No   Up to date of Bone De/nsity/Dexa? Not indicated Colorectal: completed 09/17/2015  Additional Screenings:  Hepatitis C Screening: due, will order for future labs      Plan:  I have personally reviewed and addressed the Medicare Annual Wellness questionnaire and have noted the following in the patient's chart:  A. Medical and social history B. Use of alcohol, tobacco or illicit drugs  C. Current medications and supplements D. Functional ability and status E.  Nutritional status F.  Physical activity G. Advance directives H. List of other physicians I.  Hospitalizations, surgeries, and ER visits in previous 12 months J.  Vitals K. Screenings such as hearing and vision if needed, cognitive and depression L. Referrals and appointments   In addition, I have reviewed and discussed with patient certain preventive protocols, quality metrics, and best practice recommendations. A written personalized care plan for preventive services as well as general preventive health recommendations were provided   to patient.   Signed,  Tiffany  Hill, LPN Nurse Health Advisor   Nurse Notes:none    

## 2017-11-07 ENCOUNTER — Other Ambulatory Visit: Payer: Medicare HMO

## 2017-11-07 DIAGNOSIS — Z79899 Other long term (current) drug therapy: Secondary | ICD-10-CM

## 2017-11-07 DIAGNOSIS — E785 Hyperlipidemia, unspecified: Principal | ICD-10-CM

## 2017-11-07 DIAGNOSIS — E1149 Type 2 diabetes mellitus with other diabetic neurological complication: Secondary | ICD-10-CM | POA: Diagnosis not present

## 2017-11-07 DIAGNOSIS — I10 Essential (primary) hypertension: Secondary | ICD-10-CM | POA: Diagnosis not present

## 2017-11-07 DIAGNOSIS — Z1159 Encounter for screening for other viral diseases: Secondary | ICD-10-CM

## 2017-11-07 DIAGNOSIS — IMO0002 Reserved for concepts with insufficient information to code with codable children: Secondary | ICD-10-CM

## 2017-11-07 DIAGNOSIS — E1169 Type 2 diabetes mellitus with other specified complication: Secondary | ICD-10-CM

## 2017-11-07 DIAGNOSIS — E1165 Type 2 diabetes mellitus with hyperglycemia: Secondary | ICD-10-CM

## 2017-11-08 LAB — LIPID PANEL
Cholesterol: 224 mg/dL — ABNORMAL HIGH (ref <200)
HDL: 53 mg/dL (ref >50)
LDL Cholesterol (Calc): 135 mg/dL (calc) — ABNORMAL HIGH
Non-HDL Cholesterol (Calc): 171 mg/dL (calc) — ABNORMAL HIGH (ref <130)
Total CHOL/HDL Ratio: 4.2 (calc) (ref <5.0)
Triglycerides: 215 mg/dL — ABNORMAL HIGH (ref <150)

## 2017-11-08 LAB — CBC WITH DIFFERENTIAL/PLATELET
Basophils Absolute: 77 cells/uL (ref 0–200)
Basophils Relative: 1 %
Eosinophils Absolute: 131 cells/uL (ref 15–500)
Eosinophils Relative: 1.7 %
HCT: 36.3 % (ref 35.0–45.0)
Hemoglobin: 12.2 g/dL (ref 11.7–15.5)
Lymphs Abs: 2603 cells/uL (ref 850–3900)
MCH: 28.7 pg (ref 27.0–33.0)
MCHC: 33.6 g/dL (ref 32.0–36.0)
MCV: 85.4 fL (ref 80.0–100.0)
MPV: 11.1 fL (ref 7.5–12.5)
Monocytes Relative: 7.5 %
Neutro Abs: 4312 cells/uL (ref 1500–7800)
Neutrophils Relative %: 56 %
Platelets: 232 10*3/uL (ref 140–400)
RBC: 4.25 10*6/uL (ref 3.80–5.10)
RDW: 15.3 % — ABNORMAL HIGH (ref 11.0–15.0)
Total Lymphocyte: 33.8 %
WBC mixed population: 578 cells/uL (ref 200–950)
WBC: 7.7 10*3/uL (ref 3.8–10.8)

## 2017-11-08 LAB — COMPLETE METABOLIC PANEL WITH GFR
AG Ratio: 1.5 (calc) (ref 1.0–2.5)
ALT: 34 U/L — ABNORMAL HIGH (ref 6–29)
AST: 17 U/L (ref 10–35)
Albumin: 4.1 g/dL (ref 3.6–5.1)
Alkaline phosphatase (APISO): 122 U/L (ref 33–130)
BUN: 16 mg/dL (ref 7–25)
CO2: 27 mmol/L (ref 20–32)
Calcium: 9.5 mg/dL (ref 8.6–10.4)
Chloride: 98 mmol/L (ref 98–110)
Creat: 0.87 mg/dL (ref 0.50–1.05)
GFR, Est African American: 88 mL/min/{1.73_m2} (ref >=60)
GFR, Est Non African American: 76 mL/min/{1.73_m2} (ref >=60)
Globulin: 2.8 g/dL (calc) (ref 1.9–3.7)
Glucose, Bld: 303 mg/dL — ABNORMAL HIGH (ref 65–139)
Potassium: 4.9 mmol/L (ref 3.5–5.3)
Sodium: 132 mmol/L — ABNORMAL LOW (ref 135–146)
Total Bilirubin: 0.3 mg/dL (ref 0.2–1.2)
Total Protein: 6.9 g/dL (ref 6.1–8.1)

## 2017-11-08 LAB — HEMOGLOBIN A1C: Hgb A1c MFr Bld: >14 % of total Hgb — ABNORMAL HIGH (ref <5.7)

## 2017-11-08 LAB — HEPATITIS C ANTIBODY
Hepatitis C Ab: NONREACTIVE
SIGNAL TO CUT-OFF: 0.07 (ref <1.00)

## 2017-11-08 LAB — TSH: TSH: 2 mIU/L

## 2017-11-10 ENCOUNTER — Encounter: Payer: Self-pay | Admitting: Family Medicine

## 2017-11-10 ENCOUNTER — Ambulatory Visit (INDEPENDENT_AMBULATORY_CARE_PROVIDER_SITE_OTHER): Payer: Medicare HMO | Admitting: Family Medicine

## 2017-11-10 VITALS — BP 154/93 | HR 113 | Temp 97.8°F | Resp 16 | Ht 70.5 in | Wt 256.0 lb

## 2017-11-10 DIAGNOSIS — E785 Hyperlipidemia, unspecified: Secondary | ICD-10-CM

## 2017-11-10 DIAGNOSIS — E1169 Type 2 diabetes mellitus with other specified complication: Secondary | ICD-10-CM | POA: Diagnosis not present

## 2017-11-10 DIAGNOSIS — IMO0002 Reserved for concepts with insufficient information to code with codable children: Secondary | ICD-10-CM

## 2017-11-10 DIAGNOSIS — F251 Schizoaffective disorder, depressive type: Secondary | ICD-10-CM

## 2017-11-10 DIAGNOSIS — E1165 Type 2 diabetes mellitus with hyperglycemia: Secondary | ICD-10-CM | POA: Diagnosis not present

## 2017-11-10 DIAGNOSIS — E1149 Type 2 diabetes mellitus with other diabetic neurological complication: Secondary | ICD-10-CM | POA: Diagnosis not present

## 2017-11-10 NOTE — Assessment & Plan Note (Addendum)
Severely Uncontrolled DM with hyperglycemia, A1c >14 now - up from 10 last time Complications - severe peripheral neuropathy, other including hyperlipidemia, mood disorder depression, morbid obesity - increases risk of future cardiovascular complications / poor glucose control due to reduced lifestyle diet/exercise with low energy mood and fatigue  Also concern that on 2nd gen anti-psychotic Abilify can adversely impact her glucose/cholesterol results  Plan:  1. Discussion on significant concerns with both lifestyle/diet and medication adherence - Recommend that she get 2nd opinion with diabetic specialist - referral to [facility] Endocrinology for further management of Diabetes, may consider other treatment options and other lifestyle interventions - For now continue Trulicity 1.5mg  weekly, Basaglar 46u daily (titrate up as advised), Metformin 1000mg  BID - Anticipate she may benefit from switch to other GLP such as Ozempic in future - May need SGLT2 in addition but concerns with her neuropathy 2. Encourage improved lifestyle - low carb, low sugar diet, reduce portion size, continue improving regular exercise - handouts given again for diet / carb limitations 3. Check CBG, bring log to next visit for review 4. Continue ACEi, Statin 5. DM Foot exam done today - return to Podiatry Dr Vickki Muff / Advised to schedule DM ophtho exam [facility]), send record 6. Follow-up 3 months for f/u after Endocrinology

## 2017-11-10 NOTE — Assessment & Plan Note (Signed)
Uncontrolled cholesterol on statin but not regularly taking, poor lifestyle Last lipid panel 10/2017 Calculated ASCVD 10 yr risk score elevated due to uncontrolled T2DM  Plan: 1. Continue current meds - Atorvastatin 40mg  - restart med, has rx 2. Encourage improved lifestyle - low carb/cholesterol, reduce portion size, continue improving regular exercise

## 2017-11-10 NOTE — Patient Instructions (Addendum)
Thank you for coming to the office today.  A1c >14  Referral to [facility] Endocrinology for Diabetes specialist  [facility] [place], [place]  [address] Phone: [phone]  Stay tuned for update on schedule - if you don't hear back by 2 weeks call them or [place] to check status  They will review medicines, re-check sugar, and determine how to better treat your Diabetes  Between now and then - go ahead and improve diet, low sugar, low carb and avoid excess snacks, sweets, and larger portions.  Stay active, mowing lawn and other exercise  We may need to change medicines if it will be a while before their apt  ---------------------------  If fasting sugar in morning is >170, may increase Basaglar up by 2 units every 1 week., stop around 60 units if needed  For Mammogram screening for breast cancer   Call the [place] below anytime to schedule your own appointment now that order has been placed.  [facility] [place], [place] [address] Phone: [phone]  Your provider would like to you have your annual eye exam. Please contact your current eye doctor or here are some good options for you to contact.    [facility] [address], [place], [place] [address] Phone: [phone] [email or web address]   Please schedule a Follow-up Appointment to: Return in about 4 months (around 03/13/2018) for Diabetes (endo f/u), Weight.  If you have any other questions or concerns, please feel free to call the office or send a message through [place]. You may also schedule an earlier appointment if necessary.   Additionally, you may be receiving a survey about your experience at our office within a few days to 1 week by e-mail or mail. We value your feedback.  [name], DO [place]

## 2017-11-10 NOTE — Assessment & Plan Note (Signed)
Seems still stable Followed by [facility] Psychiatry Controlled on Abilify 10mg  and Paxil 20mg 

## 2017-11-10 NOTE — Progress Notes (Signed)
Subjective:    Patient ID: [name], female    DOB: 1965/11/29, 52 y.o.   MRN: [id number]  [name] is a 52 y.o. female presenting on 11/10/2017 for Diabetes   HPI   CHRONIC DM, Type 2/ Complication with DMneuropathy - Last visit with me 10/24/17 DM, she had worsening A1c control up to 10 due to dietary non adherence and issues with medications, see last note for background information - Interval update: Labs reviewed showed A1c >14 again She had some nausea on Trulicity initially but then improved still on higher dose, thinks may have some problem but not significant - her sister will be with her for 4 weeks, to help her be motivated and improve lifestyle - CBGs: infrequent checking now - previously >250-300 Meds:Basaglar 45daily [place] AM (up from 42 previous visit), Trulicity 1.5mg  weekly (Saturday), Metformin1000mg  BID Currently on ACEi Lifestyle: - Lost 4 lbs in 2 weeks - Diet (admits poor diet not adhering to DM diet - request info on carb counting and diet sheet) - Exercise (limited exercise - now improving more outdoor walking and mowing lawn) - Regarding DM NeuropathyAdmits numbness in feet, chronically,back on Gabapentin 100mg  BID some relief Still overdue for DM Eye Exam despite prior handout / referral - she has not been able to schedule with [facility] yet - she will call to schedule - Followed by Dr Vickki Muff Podiatry - will have nails clipped / trimmed later this month Denies hypoglycemia, polyuria, visual changes  HYPERLIPIDEMIA: - Reports concerns with lifestyle wt. Last lipid panel 10/2017, abnormal elevated LDL and TG due to hyperglycemia - Currently taking Atorvastatin 40mg , tolerating well without side effects or myalgias  SCHIZOAFFECTIVE DISORDER / Depression Chronic problem, followed by [facility] psychiatry. Continues on Abilify 10mg  and Paxil 20mg  daily  Depression screen [facility] 2/9 11/10/2017 11/10/2017 10/24/2017  Decreased Interest 0 0 0   Down, Depressed, Hopeless 0 0 0  PHQ - 2 Score 0 0 0  Altered sleeping 0 - -  Tired, decreased energy 1 - -  Change in appetite 2 - -  Feeling bad or failure about yourself  0 - -  Trouble concentrating 0 - -  Moving slowly or fidgety/restless 0 - -  Suicidal thoughts 0 - -  PHQ-9 Score 3 - -  Difficult doing work/chores Not difficult at all - -    Social History   Tobacco Use  . Smoking status: Former Smoker    Packs/day: 3.00    Years: 30.00    Pack years: 90.00    Last attempt to quit: 05/02/2009    Years since quitting: 8.5  . Smokeless tobacco: Never Used  Substance Use Topics  . Alcohol use: No  . Drug use: No    Review of Systems Per HPI unless specifically indicated above     Objective:    BP (!) 154/93   Pulse (!) 113   Temp 97.8 F (36.6 C) (Oral)   Resp 16   Ht 5' 10.5" (1.791 m)   Wt 256 lb (116.1 kg)   BMI 36.21 kg/m   Wt Readings from Last 3 Encounters:  11/10/17 256 lb (116.1 kg)  10/24/17 260 lb (117.9 kg)  10/24/17 260 lb (117.9 kg)    Physical Exam  Constitutional: She is oriented to person, place, and time. She appears well-developed and well-nourished. No distress.  Well-appearing, comfortable, cooperative, obese  HENT:  Head: Normocephalic and atraumatic.  Mouth/Throat: Oropharynx is clear and moist.  Eyes:  Conjunctivae are normal. Right eye exhibits no discharge. Left eye exhibits no discharge.  Neck: Normal range of motion. Neck supple. No thyromegaly present.  Cardiovascular: Normal rate, regular rhythm, normal heart sounds and intact distal pulses.  No murmur heard. Pulmonary/Chest: Effort normal and breath sounds normal. No respiratory distress. She has no wheezes. She has no rales.  Musculoskeletal: Normal range of motion. She exhibits no edema.  Lymphadenopathy:    She has no cervical adenopathy.  Neurological: She is alert and oriented to person, place, and time.  Skin: Skin is warm and dry. No rash noted. She is not  diaphoretic. No erythema.  Psychiatric: She has a normal mood and affect. Her behavior is normal.  Well groomed, good eye contact, normal speech and thoughts  Nursing note and vitals reviewed.    Diabetic Foot Exam - Simple   Simple Foot Form Diabetic Foot exam was performed with the following findings:  Yes 11/10/2017  1:47 PM  Visual Inspection See comments:  Yes Sensation Testing See comments:  Yes Pulse Check Posterior Tibialis and Dorsalis pulse intact bilaterally:  Yes Comments Bilateral feet with extensive loss of sensation to monofilament and light touch. Increased toenails thickening and overgrown. Callus formation bilateral feet forefoot and heels.     Results for orders placed or performed in visit on 11/07/17  TSH  Result Value Ref Range   TSH 2.00 mIU/L  Hepatitis C antibody screen  Result Value Ref Range   Hepatitis C Ab NON-REACTIVE NON-REACTI   SIGNAL TO CUT-OFF 0.07 <1.00  Hemoglobin A1c  Result Value Ref Range   Hgb A1c MFr Bld >14.0 (H) <5.7 % of total Hgb   Mean Plasma Glucose  (calc)  CBC with Differential/Platelet  Result Value Ref Range   WBC 7.7 3.8 - 10.8 Thousand/uL   RBC 4.25 3.80 - 5.10 Million/uL   Hemoglobin 12.2 11.7 - 15.5 g/dL   HCT 36.3 35.0 - 45.0 %   MCV 85.4 80.0 - 100.0 fL   MCH 28.7 27.0 - 33.0 pg   MCHC 33.6 32.0 - 36.0 g/dL   RDW 15.3 (H) 11.0 - 15.0 %   Platelets 232 140 - 400 Thousand/uL   MPV 11.1 7.5 - 12.5 fL   Neutro Abs 4,312 1,500 - 7,800 cells/uL   Lymphs Abs 2,603 850 - 3,900 cells/uL   WBC mixed population 578 200 - 950 cells/uL   Eosinophils Absolute 131 15 - 500 cells/uL   Basophils Absolute 77 0 - 200 cells/uL   Neutrophils Relative % 56 %   Total Lymphocyte 33.8 %   Monocytes Relative 7.5 %   Eosinophils Relative 1.7 %   Basophils Relative 1.0 %  COMPLETE METABOLIC PANEL WITH GFR  Result Value Ref Range   Glucose, Bld 303 (H) 65 - 139 mg/dL   BUN 16 7 - 25 mg/dL   Creat 0.87 0.50 - 1.05 mg/dL   GFR, Est  Non African American 76 > OR = 60 mL/min/1.19m2   GFR, Est African American 88 > OR = 60 mL/min/1.23m2   BUN/Creatinine Ratio NOT APPLICABLE 6 - 22 (calc)   Sodium 132 (L) 135 - 146 mmol/L   Potassium 4.9 3.5 - 5.3 mmol/L   Chloride 98 98 - 110 mmol/L   CO2 27 20 - 32 mmol/L   Calcium 9.5 8.6 - 10.4 mg/dL   Total Protein 6.9 6.1 - 8.1 g/dL   Albumin 4.1 3.6 - 5.1 g/dL   Globulin 2.8 1.9 - 3.7 g/dL (calc)  AG Ratio 1.5 1.0 - 2.5 (calc)   Total Bilirubin 0.3 0.2 - 1.2 mg/dL   Alkaline phosphatase (APISO) 122 33 - 130 U/L   AST 17 10 - 35 U/L   ALT 34 (H) 6 - 29 U/L  Lipid panel  Result Value Ref Range   Cholesterol 224 (H) <200 mg/dL   HDL 53 >50 mg/dL   Triglycerides 215 (H) <150 mg/dL   LDL Cholesterol (Calc) 135 (H) mg/dL (calc)   Total CHOL/HDL Ratio 4.2 <5.0 (calc)   Non-HDL Cholesterol (Calc) 171 (H) <130 mg/dL (calc)      Assessment & Plan:   Problem List Items Addressed This Visit    DM (diabetes mellitus), type 2, 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HCC) - Primary    Severely Uncontrolled DM with hyperglycemia, A1c >14 now - up from 10 last time Complications - severe peripheral neuropathy, other including hyperlipidemia, mood disorder depression, morbid obesity - increases risk of future cardiovascular complications / poor glucose control due to reduced lifestyle diet/exercise with low energy mood and fatigue  Also concern that on 2nd gen anti-psychotic Abilify can adversely impact her glucose/cholesterol results  Plan:  1. Discussion on significant concerns with both lifestyle/diet and medication adherence - Recommend that she get 2nd opinion with diabetic specialist - referral to [facility] Endocrinology for further management of Diabetes, may consider other treatment options and other lifestyle interventions - For now continue Trulicity 1.5mg  weekly, Basaglar 46u daily (titrate up as advised), Metformin 1000mg  BID - Anticipate she may benefit from switch to other  GLP such as Ozempic in future - May need SGLT2 in addition but concerns with her neuropathy 2. Encourage improved lifestyle - low carb, low sugar diet, reduce portion size, continue improving regular exercise - handouts given again for diet / carb limitations 3. Check CBG, bring log to next visit for review 4. Continue ACEi, Statin 5. DM Foot exam done today - return to Podiatry Dr Vickki Muff / Advised to schedule DM ophtho exam [facility]), send record 6. Follow-up 3 months for f/u after Endocrinology       Relevant Orders   Ambulatory referral to Endocrinology   Hyperlipidemia associated with type 2 diabetes mellitus ([place])    Uncontrolled cholesterol on statin but not regularly taking, poor lifestyle Last lipid panel 10/2017 Calculated ASCVD 10 yr risk score elevated due to uncontrolled T2DM  Plan: 1. Continue current meds - Atorvastatin 40mg  - restart med, has rx 2. Encourage improved lifestyle - low carb/cholesterol, reduce portion size, continue improving regular exercise      Schizoaffective disorder ([place])    Seems still stable Followed by [place] [facility] Controlled on Abilify 10mg  and Paxil 20mg          No orders of the defined types were placed in this encounter.  Orders Placed This Encounter  Procedures  . Ambulatory referral to Endocrinology    Referral Priority:   Routine    Referral Type:   Consultation    Referral Reason:   Specialty Services Required    Referred to Provider:   Judi Cong, MD    Requested Specialty:   Endocrinology    Number of Visits Requested:   1    Follow up plan: Return in about 4 months (around 03/13/2018) for Diabetes (endo f/u), Weight.   [name], [facility] 11/10/2017, 5:26 PM

## 2017-11-12 ENCOUNTER — Other Ambulatory Visit: Payer: Self-pay | Admitting: Family Medicine

## 2017-11-12 DIAGNOSIS — IMO0001 Reserved for inherently not codable concepts without codable children: Secondary | ICD-10-CM

## 2017-11-12 DIAGNOSIS — IMO0002 Reserved for concepts with insufficient information to code with codable children: Secondary | ICD-10-CM

## 2017-11-12 DIAGNOSIS — E1165 Type 2 diabetes mellitus with hyperglycemia: Secondary | ICD-10-CM

## 2017-11-12 DIAGNOSIS — I10 Essential (primary) hypertension: Secondary | ICD-10-CM

## 2017-11-12 DIAGNOSIS — E1142 Type 2 diabetes mellitus with diabetic polyneuropathy: Secondary | ICD-10-CM

## 2017-11-12 DIAGNOSIS — Z794 Long term (current) use of insulin: Secondary | ICD-10-CM

## 2017-11-21 ENCOUNTER — Other Ambulatory Visit: Payer: Self-pay | Admitting: *Deleted

## 2017-11-21 ENCOUNTER — Encounter: Payer: Self-pay | Admitting: *Deleted

## 2017-11-21 NOTE — Patient Outreach (Signed)
[facility]) Care Management  11/21/2017  SHANTIA SANFORD 10-28-1965 [id number]  Referral from MD office: Reason: Medication Adherence; patient requested assistance with medication; Dx: DM 2-uncontrolled with neurologic complication.  Per lab review-A1c greater than 14 11/07/2017  Telephone call #1 to patient; left HIPPA compliant voice mail requesting call back.  Plan: Geophysicist/field seismologist. Follow up 2-4 business days.  Sherrin Daisy, RN BSN [place] Management Coordinator [facility] Care Management  [phone]

## 2017-11-27 ENCOUNTER — Other Ambulatory Visit: Payer: Self-pay | Admitting: *Deleted

## 2017-11-27 NOTE — Patient Outreach (Signed)
[place] [facility]) Care Management  11/27/2017  [name] 1966-03-13 [id number]  Referral from MD office: Reason: Medication Adherence; patient requested assistance with medication; Dx: DM 2-uncontrolled with neurologic complication.  Telephone call #2 to patient who was advised of reason for call & [facility] care management & [facility] care management services.  HIPPA verification received.  Patient voices major diagnoses - "Diabetes with neuropathy of feet, Schizophrenia".  Voices that her major concerns are she "is not on track of taking her medications correctly and blood sugar is out of control. States she forgets if she has taken or not taking medications. States she needs help with managing her medications. Also states blood sugar is very high with last HgA1c level greater than 14 a month ago. States she and her mother eat out a lot and sometimes she goes on binges" States she will get new glucometer tomorrow and has instruction to check blood sugar 4-5 times daily.  States her blood sugar runs from 200-400 in  A days time.  States she has been referred to Lifestyle classes and is awaiting call for information on start date.  Patient advised of [facility] services; consents to [facility] care coordinator for help with uncontrolled diabetes and referral to Pharmacy to assistant with medication management.  Plan: Refer to [facility] care coordinator for complex case management; uncontrolled DM with A1c greater than 14;  Needs in home assessment/evaluation for further needs. Referral to Pharmacist for medication management assistance.  Sherrin Daisy, RN BSN [place] Management Coordinator [facility] Care Management  [phone]

## 2017-12-01 ENCOUNTER — Other Ambulatory Visit: Payer: Self-pay | Admitting: *Deleted

## 2017-12-01 NOTE — Patient Outreach (Signed)
[facility]) Care Management  12/01/2017  TABRIA STEINES 01/20/1966 [id number]  Per CMA-L.Comer-patient did no renew coverage for July. States this information was verified with analyst. Patient is not eligible for [facility] services.  Telephone call to patient; left HIPPA compliant message requesting call back.  Plan:  Will follow up.  Sherrin Daisy, RN BSN [place] Management Coordinator [facility] Care Management  [phone]

## 2017-12-06 ENCOUNTER — Encounter: Payer: Self-pay | Admitting: *Deleted

## 2017-12-06 ENCOUNTER — Other Ambulatory Visit: Payer: Self-pay | Admitting: *Deleted

## 2017-12-06 NOTE — Patient Outreach (Signed)
[place] [facility]) Care Management  12/06/2017  EARLYN SYLVAN 1965/05/08 [id number]  Incoming call from patient. HIPPA verification received.  Patient was advised that she was not eligible for [facility] services. Patient voices understanding.  States she will be starting her first lifestyle class soon. Also voices that she is managing her meds better by using pill box. States her blood sugar is down from 400 to 200 now. States she is getting support from her sister.   Plan: Case closure due to not eligible for services. Send MD closure letter.  Sherrin Daisy, RN BSN [place] Management Coordinator [facility] Care Management  [phone]

## 2017-12-12 ENCOUNTER — Encounter: Payer: Self-pay | Admitting: Dietician

## 2017-12-12 ENCOUNTER — Encounter: Payer: Medicare Other | Attending: Surgery | Admitting: Dietician

## 2017-12-12 VITALS — BP 102/60 | Ht 70.0 in | Wt 256.3 lb

## 2017-12-12 DIAGNOSIS — E119 Type 2 diabetes mellitus without complications: Secondary | ICD-10-CM | POA: Diagnosis not present

## 2017-12-12 DIAGNOSIS — Z713 Dietary counseling and surveillance: Secondary | ICD-10-CM | POA: Insufficient documentation

## 2017-12-12 DIAGNOSIS — E1140 Type 2 diabetes mellitus with diabetic neuropathy, unspecified: Secondary | ICD-10-CM

## 2017-12-12 DIAGNOSIS — Z794 Long term (current) use of insulin: Secondary | ICD-10-CM

## 2017-12-12 NOTE — Patient Instructions (Addendum)
  Check blood sugars 4 x day before each meal and before bed every day  Bring blood sugar records to the next appointment/class  Call your doctor for a prescription for: 1. Meter strips (type): One Touch Verio test strips checking 4 times a day  2. Lancets (type):One Touch Delica lancets checking 4  times a day  Exercise: continue walking 15 min. 4 days/wk ONLY if BG <250  Eat 3 meals day and 1 snack a day at bedtime  Eat 3 carbohydrate servings/meal + protein  Eat 1 carbohydrate serving/snack + protein  Space meals 4-5 hours apart  Avoid sugar sweetened drinks (soda, tea, coffee, sports drinks, juices)  Limit intake of sweets and fried foods  Drink plenty of water  Complete 3 Day Food Record and bring to next appt  Make a dentist  appointment  Get a Sharps container  Carry fast acting glucose or candy and a snack at all times  Carry medical alert ID at all times  Rotate injection sites  Take Gabapentin as directed by MD  Start Novolog 5 units before each meal (15 min. before eating)+ sliding scale as follows per MD:   150-200=add 2 units 201-250=add 4 units 251-300=add 6 units 301-350=add 8 units 351-400=add 10 units Over 400=add 12 units  Check feet daily  Call if questions or problems arise  Return for appointment on: 12-28-17

## 2017-12-12 NOTE — Progress Notes (Signed)
Diabetes Self-Management Education  Visit Type: First/Initial  Appt. Start Time: 1500 Appt. End Time: 1630  12/12/2017  [name], identified by name and date of birth, is a 52 y.o. female with a diagnosis of Diabetes: Type 2.   ASSESSMENT  Blood pressure 102/60, height 5\' 10"  (1.778 m), weight 256 lb 4.8 oz (116.3 kg). Body mass index is 36.78 kg/m.  Diabetes Self-Management Education - 12/12/17 1638      Visit Information   Visit Type  First/Initial      Initial Visit   Diabetes Type  Type 2      Health Coping   How would you rate your overall health?  Fair      Psychosocial Assessment   Patient Belief/Attitude about Diabetes  Motivated to manage diabetes    Self-care barriers  None    Self-management support  Doctor's office;Family    Other persons present  Patient;Family Member    Patient Concerns  Weight Control;Glycemic Control;Medication;Healthy Lifestyle   become more fit   Special Needs  None    Preferred Learning Style  Auditory;Visual    Learning Readiness  Ready    What is the last grade level you completed in school?  1 year college      Pre-Education Assessment   Patient understands the diabetes disease and treatment process.  Needs Review    Patient understands incorporating nutritional management into lifestyle.  Needs Review    Patient undertands incorporating physical activity into lifestyle.  Needs Review    Patient understands using medications safely.  Needs Review    Patient understands monitoring blood glucose, interpreting and using results  Needs Review    Patient understands prevention, detection, and treatment of acute complications.  Needs Review    Patient understands prevention, detection, and treatment of chronic complications.  Needs Review    Patient understands how to develop strategies to address psychosocial issues.  Needs Review    Patient understands how to develop strategies to promote health/change behavior.  Needs  Review      Complications   Last HgB A1C per patient/outside source  14 %   11-07-17   How often do you check your blood sugar?  1-2 times/day    Fasting Blood glucose range (mg/dL)  >200   overall BG average 230 : checks at bedtime   Have you had a dilated eye exam in the past 12 months?  Yes   2018; appt 12-2017   Have you had a dental exam in the past 12 months?  No   about 10 years ago   Are you checking your feet?  Yes   c/o constant numbness feet and left hand   How many days per week are you checking your feet?  3      Dietary Intake   Breakfast  eats toast and jelly with cold cereal & milk at 10a    Snack (morning)  eats fruit, crackers, sugar free jello or sugar free pudding at 12:30p    Lunch  eats lunch at 3p-frozen lean cuisine    Snack (afternoon)  none    Snack (evening)  eats supper at 7p=soup, checken and vegetables    Beverage(s)  drinks water 8+x/day, diet sodas 4-5x/day, fruit juice 0-1x/day and milk 2x/day      Exercise   Exercise Type  Light (walking / raking leaves)    How many days per week to you exercise?  3    How many minutes per day  do you exercise?  15    Total minutes per week of exercise  45      Patient Education   Previous Diabetes Education  Yes (please comment)    Disease state   Explored patient's options for treatment of their diabetes    Nutrition management   Role of diet in the treatment of diabetes and the relationship between the three main macronutrients and blood glucose level;Carbohydrate counting;Food label reading, portion sizes and measuring food.    Physical activity and exercise   Role of exercise on diabetes management, blood pressure control and cardiac health.;Helped patient identify appropriate exercises in relation to his/her diabetes, diabetes complications and other health issue.    Medications  Taught/reviewed insulin injection, site rotation, insulin storage and needle disposal.;Reviewed patients medication for diabetes,  action, purpose, timing of dose and side effects.    Monitoring  Taught/evaluated SMBG meter.;Taught/discussed recording of test results and interpretation of SMBG.;Identified appropriate SMBG and/or A1C goals.;Yearly dilated eye exam;Purpose and frequency of SMBG.   gave pt One Touch Verio FLex meter and instructed on its use-BG 254 (2 hr pp)   Acute complications  Taught treatment of hypoglycemia - the 15 rule.;Discussed and identified patients' treatment of hyperglycemia.    Chronic complications  Relationship between chronic complications and blood glucose control;Assessed and discussed foot care and prevention of foot problems;Retinopathy and reason for yearly dilated eye exams;Reviewed with patient heart disease, higher risk of, and prevention;Dental care;Nephropathy, what it is, prevention of, the use of ACE, ARB's and early detection of through urine microalbumia.;Identified and discussed with patient  current chronic complications;Lipid levels, blood glucose control and heart disease    Personal strategies to promote health  Lifestyle issues that need to be addressed for better diabetes care;Helped patient develop diabetes management plan for (enter comment)      Outcomes   Expected Outcomes  Demonstrated interest in learning. Expect positive outcomes       Individualized Plan for Diabetes Self-Management Training:   Learning Objective:  Patient will have a greater understanding of diabetes self-management. Patient education plan is to attend individual and/or group sessions per assessed needs and concerns.   Plan:   Patient Instructions   Check blood sugars 4 x day before each meal and before bed every day  Bring blood sugar records to the next appointment/class  Call your doctor for a prescription for: 1. Meter strips (type): One Touch Verio test strips checking 4 times a day  2. Lancets (type):One Touch Delica lancets checking 4  times a day  Exercise: continue walking 15  min. 4 days/wk-ONLY if BG <250  Eat 3 meals day and 1 snack a day at bedtime  Eat 3 carbohydrate servings/meal + protein  Eat 1 carbohydrate serving/snack + protein  Space meals 4-5 hours apart  Avoid sugar sweetened drinks (soda, tea, coffee, sports drinks, juices)  Limit intake of sweets and fried foods  Drink plenty of water  Complete 3 Day Food Record and bring to next appt  Make a dentist  appointment  Get a Sharps container  Carry fast acting glucose or candy and a snack at all times  Carry medical alert ID at all times  Rotate injection sites  Take Gabapentin as directed by MD  Start Novolog 5 units before each meal (15 min. before eating) + sliding scale as follows per MD:   150-200=add 2 units 201-250=add 4 units 251-300=add 6 units 301-350=add 8 units 351-400=add 10 units Over 400=add 12 units  Check feet daily  Call if questions or problems arise  Return for appointment on: 12-28-17    Expected Outcomes:  Demonstrated interest in learning. Expect positive outcomes  Education material provided: General meal planning guidelines, Food Group handout, Living Well With Diabetes, High and Low BG handouts, medical alert ID card, 1 tube glucose tablets for PRN use, One Touch Verio Flex meter, A1C handout, foot care handout  If problems or questions, patient to contact team via:  [phone]  Future DSME appointment:  12-28-17

## 2017-12-13 ENCOUNTER — Emergency Department: Payer: Medicare Other

## 2017-12-13 ENCOUNTER — Other Ambulatory Visit: Payer: Self-pay

## 2017-12-13 ENCOUNTER — Ambulatory Visit (INDEPENDENT_AMBULATORY_CARE_PROVIDER_SITE_OTHER): Payer: Medicare Other | Admitting: Family Medicine

## 2017-12-13 ENCOUNTER — Inpatient Hospital Stay
Admission: EM | Admit: 2017-12-13 | Discharge: 2017-12-16 | DRG: 871 | Disposition: A | Discharge status: Home Health | Payer: Medicare Other | Attending: Internal Medicine | Admitting: Internal Medicine

## 2017-12-13 ENCOUNTER — Encounter: Payer: Self-pay | Admitting: Family Medicine

## 2017-12-13 ENCOUNTER — Encounter: Payer: Self-pay | Admitting: Emergency Medicine

## 2017-12-13 VITALS — BP 95/63 | HR 128 | Temp 101.8°F | Resp 16 | Ht 70.5 in | Wt 253.0 lb

## 2017-12-13 DIAGNOSIS — Z7984 Long term (current) use of oral hypoglycemic drugs: Secondary | ICD-10-CM

## 2017-12-13 DIAGNOSIS — F259 Schizoaffective disorder, unspecified: Secondary | ICD-10-CM | POA: Diagnosis present

## 2017-12-13 DIAGNOSIS — J181 Lobar pneumonia, unspecified organism: Secondary | ICD-10-CM | POA: Diagnosis present

## 2017-12-13 DIAGNOSIS — Z87891 Personal history of nicotine dependence: Secondary | ICD-10-CM | POA: Diagnosis not present

## 2017-12-13 DIAGNOSIS — I10 Essential (primary) hypertension: Secondary | ICD-10-CM | POA: Diagnosis present

## 2017-12-13 DIAGNOSIS — N179 Acute kidney failure, unspecified: Secondary | ICD-10-CM | POA: Diagnosis not present

## 2017-12-13 DIAGNOSIS — R55 Syncope and collapse: Secondary | ICD-10-CM | POA: Diagnosis present

## 2017-12-13 DIAGNOSIS — N12 Tubulo-interstitial nephritis, not specified as acute or chronic: Secondary | ICD-10-CM | POA: Diagnosis present

## 2017-12-13 DIAGNOSIS — E119 Type 2 diabetes mellitus without complications: Secondary | ICD-10-CM | POA: Diagnosis not present

## 2017-12-13 DIAGNOSIS — R6520 Severe sepsis without septic shock: Secondary | ICD-10-CM | POA: Diagnosis present

## 2017-12-13 DIAGNOSIS — E1165 Type 2 diabetes mellitus with hyperglycemia: Secondary | ICD-10-CM

## 2017-12-13 DIAGNOSIS — Z9049 Acquired absence of other specified parts of digestive tract: Secondary | ICD-10-CM | POA: Diagnosis not present

## 2017-12-13 DIAGNOSIS — F319 Bipolar disorder, unspecified: Secondary | ICD-10-CM | POA: Diagnosis present

## 2017-12-13 DIAGNOSIS — E785 Hyperlipidemia, unspecified: Secondary | ICD-10-CM | POA: Diagnosis present

## 2017-12-13 DIAGNOSIS — N10 Acute tubulo-interstitial nephritis: Secondary | ICD-10-CM | POA: Diagnosis present

## 2017-12-13 DIAGNOSIS — J189 Pneumonia, unspecified organism: Secondary | ICD-10-CM | POA: Diagnosis not present

## 2017-12-13 DIAGNOSIS — A419 Sepsis, unspecified organism: Principal | ICD-10-CM | POA: Diagnosis present

## 2017-12-13 DIAGNOSIS — Z803 Family history of malignant neoplasm of breast: Secondary | ICD-10-CM

## 2017-12-13 DIAGNOSIS — Z5320 Procedure and treatment not carried out because of patient's decision for unspecified reasons: Secondary | ICD-10-CM | POA: Diagnosis not present

## 2017-12-13 DIAGNOSIS — IMO0002 Reserved for concepts with insufficient information to code with codable children: Secondary | ICD-10-CM

## 2017-12-13 DIAGNOSIS — Z8249 Family history of ischemic heart disease and other diseases of the circulatory system: Secondary | ICD-10-CM | POA: Diagnosis not present

## 2017-12-13 DIAGNOSIS — Z8349 Family history of other endocrine, nutritional and metabolic diseases: Secondary | ICD-10-CM

## 2017-12-13 DIAGNOSIS — E1142 Type 2 diabetes mellitus with diabetic polyneuropathy: Secondary | ICD-10-CM

## 2017-12-13 DIAGNOSIS — Z794 Long term (current) use of insulin: Secondary | ICD-10-CM | POA: Diagnosis not present

## 2017-12-13 DIAGNOSIS — Z833 Family history of diabetes mellitus: Secondary | ICD-10-CM | POA: Diagnosis not present

## 2017-12-13 DIAGNOSIS — E860 Dehydration: Secondary | ICD-10-CM | POA: Diagnosis not present

## 2017-12-13 DIAGNOSIS — N390 Urinary tract infection, site not specified: Secondary | ICD-10-CM | POA: Diagnosis not present

## 2017-12-13 LAB — BASIC METABOLIC PANEL
Anion gap: 8 (ref 5–15)
BUN: 20 mg/dL (ref 6–20)
CO2: 24 mmol/L (ref 22–32)
Calcium: 8.6 mg/dL — ABNORMAL LOW (ref 8.9–10.3)
Chloride: 101 mmol/L (ref 98–111)
Creatinine, Ser: 1.24 mg/dL — ABNORMAL HIGH (ref 0.44–1.00)
GFR calc Af Amer: 56 mL/min — ABNORMAL LOW (ref >60)
GFR calc non Af Amer: 49 mL/min — ABNORMAL LOW (ref >60)
Glucose, Bld: 216 mg/dL — ABNORMAL HIGH (ref 70–99)
Potassium: 4.6 mmol/L (ref 3.5–5.1)
Sodium: 133 mmol/L — ABNORMAL LOW (ref 135–145)

## 2017-12-13 LAB — CBC
HCT: 36.9 % (ref 35.0–47.0)
Hemoglobin: 12.4 g/dL (ref 12.0–16.0)
MCH: 29.4 pg (ref 26.0–34.0)
MCHC: 33.7 g/dL (ref 32.0–36.0)
MCV: 87.3 fL (ref 80.0–100.0)
Platelets: 219 10*3/uL (ref 150–440)
RBC: 4.23 MIL/uL (ref 3.80–5.20)
RDW: 16.6 % — ABNORMAL HIGH (ref 11.5–14.5)
WBC: 9.9 10*3/uL (ref 3.6–11.0)

## 2017-12-13 LAB — POCT URINALYSIS DIPSTICK
Bilirubin, UA: NEGATIVE
Glucose, UA: NEGATIVE
Ketones, UA: 80
Nitrite, UA: POSITIVE
Protein, UA: POSITIVE — AB
Spec Grav, UA: 1.01 (ref 1.010–1.025)
Urobilinogen, UA: 0.2 E.U./dL
pH, UA: 8 (ref 5.0–8.0)

## 2017-12-13 LAB — COMPREHENSIVE METABOLIC PANEL
ALT: 27 U/L (ref 0–44)
AST: 25 U/L (ref 15–41)
Albumin: 3.5 g/dL (ref 3.5–5.0)
Alkaline Phosphatase: 84 U/L (ref 38–126)
Anion gap: 9 (ref 5–15)
BUN: 18 mg/dL (ref 6–20)
CO2: 24 mmol/L (ref 22–32)
Calcium: 8.6 mg/dL — ABNORMAL LOW (ref 8.9–10.3)
Chloride: 101 mmol/L (ref 98–111)
Creatinine, Ser: 1.23 mg/dL — ABNORMAL HIGH (ref 0.44–1.00)
GFR calc Af Amer: 57 mL/min — ABNORMAL LOW (ref >60)
GFR calc non Af Amer: 49 mL/min — ABNORMAL LOW (ref >60)
Glucose, Bld: 211 mg/dL — ABNORMAL HIGH (ref 70–99)
Potassium: 4.6 mmol/L (ref 3.5–5.1)
Sodium: 134 mmol/L — ABNORMAL LOW (ref 135–145)
Total Bilirubin: 0.6 mg/dL (ref 0.3–1.2)
Total Protein: 7.1 g/dL (ref 6.5–8.1)

## 2017-12-13 LAB — TROPONIN I: Troponin I: <0.03 ng/mL (ref <0.03)

## 2017-12-13 LAB — LACTIC ACID, PLASMA: Lactic Acid, Venous: 1.1 mmol/L (ref 0.5–1.9)

## 2017-12-13 MED ORDER — SODIUM CHLORIDE 0.9 % IV BOLUS
1000.0000 mL | Freq: Once | INTRAVENOUS | Status: AC
  Administered 2017-12-13: 1000 mL via INTRAVENOUS

## 2017-12-13 MED ORDER — PROMETHAZINE HCL 12.5 MG PO TABS: 12.5000 mg | ORAL_TABLET | Freq: Three times a day (TID) | ORAL | 0 refills | Status: DC | PRN

## 2017-12-13 MED ORDER — CEFTRIAXONE SODIUM 1 G IJ SOLR
1.0000 g | Freq: Once | INTRAMUSCULAR | Status: AC
  Administered 2017-12-13: 1 g via INTRAMUSCULAR

## 2017-12-13 MED ORDER — CIPROFLOXACIN HCL 500 MG PO TABS: 500.0000 mg | ORAL_TABLET | Freq: Two times a day (BID) | ORAL | 0 refills | Status: DC

## 2017-12-13 MED ORDER — SODIUM CHLORIDE 0.9 % IV SOLN
2.0000 g | INTRAVENOUS | Status: DC
  Administered 2017-12-14 (×2): 2 g via INTRAVENOUS
  Filled 2017-12-13 (×2): qty 20
  Filled 2017-12-13: qty 2

## 2017-12-13 MED ORDER — AZITHROMYCIN 500 MG IV SOLR
500.0000 mg | INTRAVENOUS | Status: AC
  Administered 2017-12-14 – 2017-12-16 (×3): 500 mg via INTRAVENOUS
  Filled 2017-12-13 (×3): qty 500

## 2017-12-13 NOTE — ED Provider Notes (Cosign Needed)
[facility] Emergency Department Provider Note    None    (approximate)  I have reviewed the triage vital signs and the nursing notes.   HISTORY  Chief Complaint Weakness and Fall    HPI [name] is a 52 y.o. female the ER with fever as well as dysuria some shortness of breath and cough over the past several days.  She initially saw her clinic primary care physician today and was diagnosed with pyelonephritis given antibiotics but as she went home she continued to deteriorate.  She had 2 fainting spells both of which were caught by her sister.  She did not hit her head.  States she has had decreased oral intake throughout the day.  Denies any abdominal pain .   Past Medical History:  Diagnosis Date  . Depression   . Diabetes mellitus without complication ([place])   . Hyperlipidemia   . Hypertension   . Neuromuscular disorder ([place])   . Schizo affective schizophrenia ([place])    abilify and pazil   Family History  Problem Relation Age of Onset  . Breast cancer Cousin        maternal cousin  . Hypertension Mother   . Hyperthyroidism Mother   . Diabetes Maternal Grandmother   . Hypertension Maternal Grandmother   . Cancer Maternal Grandmother        unknown type  . Diabetes Maternal Grandfather   . Hypertension Maternal Grandfather   . Diabetes Paternal Grandmother   . Hypertension Paternal Grandmother   . Diabetes Paternal Grandfather   . Hypertension Paternal Grandfather    Past Surgical History:  Procedure Laterality Date  . CHOLECYSTECTOMY    . IRRIGATION AND DEBRIDEMENT FOOT Right 02/26/2016   Procedure: RIGHT 2ND TOE DEBRIDEMENT;  Surgeon: Samara Deist, DPM;  Location: [facility] ORS;  Service: Podiatry;  Laterality: Right;   Patient Active Problem List   Diagnosis Date Noted  . Morbid obesity ([place]) 05/10/2017  . Long-term use of high-risk medication 05/10/2017  . Hyperlipidemia associated with type 2 diabetes mellitus ([place])  06/27/2016  . Hammer toe of second toe of right foot 04/20/2016  . Essential hypertension 06/29/2015  . Schizoaffective disorder ([place]) 06/29/2015  . DM (diabetes mellitus), type 2, 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[place]) 62/94/7654  . Polyneuropathy 04/02/2014      Prior to Admission medications   Medication Sig Start Date End Date Taking? Authorizing Provider  ARIPiprazole (ABILIFY) 10 MG tablet Take 10 mg by mouth daily.    [provider]  atorvastatin (LIPITOR) 40 MG tablet Take 1 tablet (40 mg total) by mouth at bedtime. 01/18/17   Karamalegos, Devonne Doughty, DO  B-D UF III MINI PEN NEEDLES 31G X 5 MM MISC USE AS DIRECTED 09/11/17   Karamalegos, Devonne Doughty, DO  Blood Glucose Monitoring Suppl (ONE TOUCH ULTRA SYSTEM KIT) w/Device KIT 1 kit by Does not apply route once. 08/20/15   Arlis Porta., MD  ciprofloxacin (CIPRO) 500 MG tablet Take 1 tablet (500 mg total) by mouth 2 (two) times daily. For 7 days 12/13/17   [name], DO  Dulaglutide (TRULICITY) 1.5 YT/0.3TW SOPN Inject 1.5 mg into the skin once a week. 05/10/17   Karamalegos, Devonne Doughty, DO  gabapentin (NEURONTIN) 100 MG capsule Take 1 in AM and 2 in PM 05/10/17   Karamalegos, Devonne Doughty, DO  glucose blood test strip Check blood sugar up to 3 times daily 06/27/16   [name], DO  insulin aspart (NOVOLOG)  100 UNIT/ML FlexPen Inject 5 Units into the skin 3 (three) times daily before meals. + sliding scale: If BG 150-200=add 2 units If BG 201-250=add 4 units If BG 251-300=add 6 units If BG 301-350=add 8 units If BG 351-400=add 10 units If BG >400=add 12 units 11/27/17 11/27/18  [provider]  Insulin Glargine (BASAGLAR KWIKPEN) 100 UNIT/ML SOPN Inject 0.45 mLs (45 Units total) into the skin daily. Adjust dose as advised 11/13/17   Karamalegos, Devonne Doughty, DO  lisinopril (PRINIVIL,ZESTRIL) 20 MG tablet TAKE 1 TABLET(20 MG) BY MOUTH DAILY 11/13/17   Parks Ranger, Devonne Doughty, DO    metFORMIN (GLUCOPHAGE) 1000 MG tablet TAKE 1 TABLET(1000 MG) BY MOUTH TWICE DAILY WITH A MEAL 11/13/17   Karamalegos, Devonne Doughty, DO  PARoxetine (PAXIL) 20 MG tablet Take 1 tablet (20 mg total) by mouth daily. Prescribed by [facility] 10/11/16   Parks Ranger, Devonne Doughty, DO  promethazine (PHENERGAN) 12.5 MG tablet Take 1-2 tablets (12.5-25 mg total) by mouth every 8 (eight) hours as needed for nausea or vomiting. 12/13/17   [name], DO    Allergies Patient has no known allergies.    Social History Social History   Tobacco Use  . Smoking status: Former Smoker    Packs/day: 3.00    Years: 30.00    Pack years: 90.00    Last attempt to quit: 05/02/2009    Years since quitting: 8.6  . Smokeless tobacco: Never Used  Substance Use Topics  . Alcohol use: No  . Drug use: No    Review of Systems Patient denies headaches, rhinorrhea, blurry vision, numbness, shortness of breath, chest pain, edema, cough, abdominal pain, nausea, vomiting, diarrhea, dysuria, fevers, rashes or hallucinations unless otherwise stated above in HPI. ____________________________________________   PHYSICAL EXAM:  VITAL SIGNS: Vitals:   12/13/17 2210 12/13/17 2231  BP:  113/80  Pulse: (!) 120 (!) 121  Resp: (!) 22 19  Temp:    SpO2: 96% 97%    Constitutional: Alert and oriented.  Eyes: Conjunctivae are normal.  Head: Atraumatic. Nose: No congestion/rhinnorhea. Mouth/Throat: Mucous membranes are moist.   Neck: No stridor. Painless ROM.  Cardiovascular: Normal rate, regular rhythm. Grossly normal heart sounds.  Good peripheral circulation. Respiratory: Normal respiratory effort.  No retractions. Lungs CTAB. Gastrointestinal: Soft and nontender. No distention. No abdominal bruits. No CVA tenderness. Genitourinary:  Musculoskeletal: No lower extremity tenderness nor edema.  No joint effusions. Neurologic:  Normal speech and language. No gross focal neurologic deficits are appreciated.  No facial droop Skin:  Skin is warm, dry and intact. No rash noted. Psychiatric: Mood and affect are normal. Speech and behavior are normal.  ____________________________________________   LABS (all labs ordered are listed, but only abnormal results are displayed)  Results for orders placed or performed during the hospital encounter of 12/13/17 (from the past 24 hour(s))  Basic metabolic panel     Status: Abnormal   Collection Time: 12/13/17 10:00 PM  Result Value Ref Range   Sodium 133 (L) 135 - 145 mmol/L   Potassium 4.6 3.5 - 5.1 mmol/L   Chloride 101 98 - 111 mmol/L   CO2 24 22 - 32 mmol/L   Glucose, Bld 216 (H) 70 - 99 mg/dL   BUN 20 6 - 20 mg/dL   Creatinine, Ser 1.24 (H) 0.44 - 1.00 mg/dL   Calcium 8.6 (L) 8.9 - 10.3 mg/dL   GFR calc non Af Amer 49 (L) >60 mL/min   GFR calc Af Amer 56 (L) >60  mL/min   Anion gap 8 5 - 15  CBC     Status: Abnormal   Collection Time: 12/13/17 10:00 PM  Result Value Ref Range   WBC 9.9 3.6 - 11.0 K/uL   RBC 4.23 3.80 - 5.20 MIL/uL   Hemoglobin 12.4 12.0 - 16.0 g/dL   HCT 36.9 35.0 - 47.0 %   MCV 87.3 80.0 - 100.0 fL   MCH 29.4 26.0 - 34.0 pg   MCHC 33.7 32.0 - 36.0 g/dL   RDW 16.6 (H) 11.5 - 14.5 %   Platelets 219 150 - 440 K/uL  Troponin I     Status: None   Collection Time: 12/13/17 10:00 PM  Result Value Ref Range   Troponin I <0.03 <0.03 ng/mL  Comprehensive metabolic panel     Status: Abnormal   Collection Time: 12/13/17 10:00 PM  Result Value Ref Range   Sodium 134 (L) 135 - 145 mmol/L   Potassium 4.6 3.5 - 5.1 mmol/L   Chloride 101 98 - 111 mmol/L   CO2 24 22 - 32 mmol/L   Glucose, Bld 211 (H) 70 - 99 mg/dL   BUN 18 6 - 20 mg/dL   Creatinine, Ser 1.23 (H) 0.44 - 1.00 mg/dL   Calcium 8.6 (L) 8.9 - 10.3 mg/dL   Total Protein 7.1 6.5 - 8.1 g/dL   Albumin 3.5 3.5 - 5.0 g/dL   AST 25 15 - 41 U/L   ALT 27 0 - 44 U/L   Alkaline Phosphatase 84 38 - 126 U/L   Total Bilirubin 0.6 0.3 - 1.2 mg/dL   GFR calc non Af Amer 49 (L)  >60 mL/min   GFR calc Af Amer 57 (L) >60 mL/min   Anion gap 9 5 - 15  Lactic acid, plasma     Status: None   Collection Time: 12/13/17 10:57 PM  Result Value Ref Range   Lactic Acid, Venous 1.1 0.5 - 1.9 mmol/L   ____________________________________________  EKG My review and personal interpretation at Time: 21:41   Indication: weakness  Rate: 120  Rhythm: sinus Axis: normal Other: normal intervals, no stemi ____________________________________________  RADIOLOGY  I personally reviewed all radiographic images ordered to evaluate for the above acute complaints and reviewed radiology reports and findings.  These findings were personally discussed with the patient.  Please see medical record for radiology report.  ____________________________________________   PROCEDURES  Procedure(s) performed:  .Critical Care Performed by: Merlyn Lot, MD Authorized by: Merlyn Lot, MD   Critical care provider statement:    Critical care time (minutes):  35   Critical care time was exclusive of:  Separately billable procedures and treating other patients   Critical care was necessary to treat or prevent imminent or life-threatening deterioration of the following conditions:  Renal failure and sepsis   Critical care was time spent personally by me on the following activities:  Development of treatment plan with patient or surrogate, discussions with consultants, evaluation of patient's response to treatment, examination of patient, obtaining history from patient or surrogate, ordering and performing treatments and interventions, ordering and review of laboratory studies, ordering and review of radiographic studies, pulse oximetry, re-evaluation of patient's condition and review of old charts      Critical Care performed: yes ____________________________________________   INITIAL IMPRESSION / [place] / ED COURSE  Pertinent labs & imaging results that were available  during my care of the patient were reviewed by me and considered in my medical decision making (see  chart for details).   DDX: Sepsis, UTI, pneumonia, cellulitis, bacteremia, dehydration, AKI  JAELAH HAUTH is a 52 y.o. who presents to the ED with was as described above.  Patient is febrile and tachycardic.  Urinalysis from clinic today does show evidence of nitrite positive UTI given her fever and symptoms clinically consistent with pyelonephritis.  Blood work does show evidence of AKI and patient is tachycardic which could explain the patient's near syncopal episodes.  We will give IV fluids.  Chest x-ray were also shows possible infiltrate in the left lower lung field concerning for pneumonia.  Given her recent cough and complaint of shortness of breath we will also treat with Rocephin and azithromycin for community-acquired pneumonia.  Cultures were ordered.  Based on her presentation with persistent sirs criteria patient will be admitted to the hospital for additional medical management.      As part of my medical decision making, I reviewed the following data within the [place] notes reviewed and incorporated, Labs reviewed, notes from prior ED visits and [place] Controlled Substance Database   ____________________________________________   FINAL CLINICAL IMPRESSION(S) / ED DIAGNOSES  Final diagnoses:  Pyelonephritis  Community acquired pneumonia of left lower lobe of lung ([place])      NEW MEDICATIONS STARTED DURING THIS VISIT:  New Prescriptions   No medications on file     Note:  This document was prepared using Dragon voice recognition software and may include unintentional dictation errors.    Merlyn Lot, MD 12/14/17 [phone]

## 2017-12-13 NOTE — Progress Notes (Signed)
Subjective:    Patient ID: [name], female    DOB: December 27, 1965, 52 y.o.   MRN: [id number]  [name] is a 52 y.o. female presenting on 12/13/2017 for Urinary Tract Infection (onset 3-4 days felt like low grade fever chills, ear pain right side)  Patient presents for a same day appointment. History provided by patient and her family member, mother here today.  HPI   Suspected Pyelonephritis vs complicated UTI Presents today after 3-4 days with UTI symptoms, having dysuria and burning with urination and urinary frequency, some discomfort abdomen without active abdominal pain. She has nausea without significant vomiting. Reduced PO intake, but she is tolerating liquids and not having vomiting today. - No recent UTI or antibiotics. Last on chart with pan sensitive E Coli in 2018 - No known antibiotic allergies - Complicated history with poorly controlled type 2 diabetes - Admits fever (documented today), chills, sweats - Denies any hematuria, loss of urine production, back or flank pain  Depression screen [facility] 2/9 12/13/2017 12/12/2017 11/27/2017  Decreased Interest 0 0 0  Down, Depressed, Hopeless 0 0 0  PHQ - 2 Score 0 0 0  Altered sleeping - - -  Tired, decreased energy - - -  Change in appetite - - -  Feeling bad or failure about yourself  - - -  Trouble concentrating - - -  Moving slowly or fidgety/restless - - -  Suicidal thoughts - - -  PHQ-9 Score - - -  Difficult doing work/chores - - -    Social History   Tobacco Use  . Smoking status: Former Smoker    Packs/day: 3.00    Years: 30.00    Pack years: 90.00    Last attempt to quit: 05/02/2009    Years since quitting: 8.6  . Smokeless tobacco: Never Used  Substance Use Topics  . Alcohol use: No  . Drug use: No    Review of Systems Per HPI unless specifically indicated above     Objective:    BP 95/63   Pulse (!) 128   Temp (!) 101.8 F (38.8 C) (Oral)   Resp 16   Ht 5' 10.5" (1.791 m)    Wt 253 lb (114.8 kg)   SpO2 100%   BMI 35.79 kg/m   Wt Readings from Last 3 Encounters:  12/13/17 253 lb (114.8 kg)  12/12/17 256 lb 4.8 oz (116.3 kg)  11/10/17 256 lb (116.1 kg)    Physical Exam  Constitutional: She is oriented to person, place, and time. She appears well-developed and well-nourished. No distress.  Currently mildly ill appearing, uncomfortable with some chills, using extra blanket had some sweat present, she is cooperative  HENT:  Head: Normocephalic and atraumatic.  Mouth/Throat: Oropharynx is clear and moist.  Eyes: Conjunctivae are normal. Right eye exhibits no discharge. Left eye exhibits no discharge.  Neck: Neck supple.  Cardiovascular: Regular rhythm, normal heart sounds and intact distal pulses.  No murmur heard. Tachycardic  Pulmonary/Chest: Effort normal and breath sounds normal. No respiratory distress. She has no wheezes. She has no rales.  Good air movement  Abdominal: Soft. Bowel sounds are normal. She exhibits no distension. There is no tenderness.  Musculoskeletal: She exhibits no edema.  No CVAT  Neurological: She is alert and oriented to person, place, and time.  Skin: Skin is warm and dry. No rash noted. She is not diaphoretic. No erythema.  Psychiatric: She has a normal mood and affect. Her behavior is normal.  Well groomed, good eye contact, normal speech and thoughts  Nursing note and vitals reviewed.  Results for orders placed or performed in visit on 12/13/17  POCT Urinalysis Dipstick  Result Value Ref Range   Color, UA yellow    Clarity, UA cloudy    Glucose, UA Negative Negative   Bilirubin, UA Negative    Ketones, UA 80    Spec Grav, UA 1.010 1.010 - 1.025   Blood, UA large    pH, UA 8.0 5.0 - 8.0   Protein, UA Positive (A) Negative   Urobilinogen, UA 0.2 0.2 or 1.0 E.U./dL   Nitrite, UA positive    Leukocytes, UA Large (3+) (A) Negative   Appearance cloudy    Odor foul       Assessment & Plan:   Problem List Items  Addressed This Visit    None    Visit Diagnoses    Pyelonephritis    -  Primary   Relevant Medications   cefTRIAXone (ROCEPHIN) injection 1 g (Completed)   ciprofloxacin (CIPRO) 500 MG tablet   promethazine (PHENERGAN) 12.5 MG tablet   Other Relevant Orders   POCT Urinalysis Dipstick (Completed)   Urine Culture      Clinically consistent with Pyelonephritis with suspicious UA and constellation of systemic symptoms No recent UTIs or abx courses. Known DM2 (uncontrolled) Appears ill but overall well without obvious complication, she is under treating fever not taking any anti-pyretic, and able to tolerate PO currently.  Plan: - Ordered Urine culture - Ceftriaxone 1g IM given today in office - Start Cipro PO 500mg  BID x 7 days empiric outpatient regimen - Start Phenergan 12.5-25mg  PRN nausea vomiting - goal to keep antibiotics down and improve hydration - Strict return criteria to office or to hospital ED if cannot tolerate oral antibiotics, if not improving 24-48 hours - may need IVF hydration and IV antibiotics if worsening   Meds ordered this encounter  Medications  . cefTRIAXone (ROCEPHIN) injection 1 g  . ciprofloxacin (CIPRO) 500 MG tablet    Sig: Take 1 tablet (500 mg total) by mouth 2 (two) times daily. For 7 days    Dispense:  14 tablet    Refill:  0  . promethazine (PHENERGAN) 12.5 MG tablet    Sig: Take 1-2 tablets (12.5-25 mg total) by mouth every 8 (eight) hours as needed for nausea or vomiting.    Dispense:  30 tablet    Refill:  0    Follow up plan: Return in about 1 week (around 12/20/2017), or if symptoms worsen or fail to improve, for pyelo UTI.  [name], [facility] 12/13/2017, 6:06 PM

## 2017-12-13 NOTE — Patient Instructions (Addendum)
Thank you for coming to the office today.  Ceftriaxone antibiotic today injection in office Start pills Cipro tonight antibiotic one twice a day with food  Phenergan for nausea as needed If worsening vomiting and cannot keep pills down or cannot keep hydration down, then need to contact us or go directly to hospital ED may need IV fluid or IV antibiotics  We sent urine culture stay tuned if we need to change antibiotic Drink plenty of fluids  Pyelonephritis, Adult Pyelonephritis is a kidney infection. The kidneys are organs that help clean your blood by moving waste out of your blood and into your pee (urine). This infection can happen quickly, or it can last for a long time. In most cases, it clears up with treatment and does not cause other problems. Follow these instructions at home: Medicines  Take over-the-counter and prescription medicines only as told by your doctor.  Take your antibiotic medicine as told by your doctor. Do not stop taking the medicine even if you start to feel better. General instructions  Drink enough fluid to keep your pee clear or pale yellow.  Avoid caffeine, tea, and carbonated drinks.  Pee (urinate) often. Avoid holding in pee for long periods of time.  Pee before and after sex.  After pooping (having a bowel movement), women should wipe from front to back. Use each tissue only once.  Keep all follow-up visits as told by your doctor. This is important. Contact a doctor if:  You do not feel better after 2 days.  Your symptoms get worse.  You have a fever. Get help right away if:  You cannot take your medicine or drink fluids as told.  You have chills and shaking.  You throw up (vomit).  You have very bad pain in your side (flank) or back.  You feel very weak or you pass out (faint). This information is not intended to replace advice given to you by your health care provider. Make sure you discuss any questions you have with your health  care provider. Document Released: 05/26/2004 Document Revised: 09/24/2015 Document Reviewed: 08/11/2014 Elsevier Interactive Patient Education  2018 [employer].   Please schedule a Follow-up Appointment to: Return in about 1 week (around 12/20/2017), or if symptoms worsen or fail to improve, for pyelo UTI.  If you have any other questions or concerns, please feel free to call the office or send a message through [place]. You may also schedule an earlier appointment if necessary.  Additionally, you may be receiving a survey about your experience at our office within a few days to 1 week by e-mail or mail. We value your feedback.  [name], DO [place]

## 2017-12-13 NOTE — ED Triage Notes (Signed)
Pt presents to ED with c/o weakness for the past 3-4 days. Pt has been lying down and states she has been having difficulty getting up. Pt was dx with UTI today. Pt states she fell at 2000 due to her weakness. Denies hitting her head or loc. Pt was given shot of antibiotic at MD office and is to start oral antibiotic tomorrow.  Denies vomiting.

## 2017-12-14 ENCOUNTER — Other Ambulatory Visit: Payer: Self-pay

## 2017-12-14 ENCOUNTER — Telehealth: Payer: Self-pay | Admitting: Family Medicine

## 2017-12-14 ENCOUNTER — Inpatient Hospital Stay
Admit: 2017-12-14 | Discharge: 2017-12-14 | Disposition: A | Discharge status: Home / Self Care | Payer: Medicare Other | Attending: Internal Medicine | Admitting: Internal Medicine

## 2017-12-14 DIAGNOSIS — A419 Sepsis, unspecified organism: Secondary | ICD-10-CM | POA: Diagnosis present

## 2017-12-14 DIAGNOSIS — I10 Essential (primary) hypertension: Secondary | ICD-10-CM | POA: Diagnosis present

## 2017-12-14 DIAGNOSIS — Z5320 Procedure and treatment not carried out because of patient's decision for unspecified reasons: Secondary | ICD-10-CM | POA: Diagnosis present

## 2017-12-14 DIAGNOSIS — R6520 Severe sepsis without septic shock: Secondary | ICD-10-CM | POA: Diagnosis present

## 2017-12-14 DIAGNOSIS — Z8349 Family history of other endocrine, nutritional and metabolic diseases: Secondary | ICD-10-CM | POA: Diagnosis not present

## 2017-12-14 DIAGNOSIS — E785 Hyperlipidemia, unspecified: Secondary | ICD-10-CM | POA: Diagnosis present

## 2017-12-14 DIAGNOSIS — Z87891 Personal history of nicotine dependence: Secondary | ICD-10-CM | POA: Diagnosis not present

## 2017-12-14 DIAGNOSIS — E119 Type 2 diabetes mellitus without complications: Secondary | ICD-10-CM | POA: Diagnosis present

## 2017-12-14 DIAGNOSIS — E860 Dehydration: Secondary | ICD-10-CM | POA: Diagnosis present

## 2017-12-14 DIAGNOSIS — N10 Acute tubulo-interstitial nephritis: Secondary | ICD-10-CM | POA: Diagnosis present

## 2017-12-14 DIAGNOSIS — Z9049 Acquired absence of other specified parts of digestive tract: Secondary | ICD-10-CM | POA: Diagnosis not present

## 2017-12-14 DIAGNOSIS — N12 Tubulo-interstitial nephritis, not specified as acute or chronic: Secondary | ICD-10-CM | POA: Diagnosis present

## 2017-12-14 DIAGNOSIS — Z7984 Long term (current) use of oral hypoglycemic drugs: Secondary | ICD-10-CM | POA: Diagnosis not present

## 2017-12-14 DIAGNOSIS — Z794 Long term (current) use of insulin: Secondary | ICD-10-CM | POA: Diagnosis not present

## 2017-12-14 DIAGNOSIS — R55 Syncope and collapse: Secondary | ICD-10-CM | POA: Diagnosis present

## 2017-12-14 DIAGNOSIS — F259 Schizoaffective disorder, unspecified: Secondary | ICD-10-CM | POA: Diagnosis present

## 2017-12-14 DIAGNOSIS — Z833 Family history of diabetes mellitus: Secondary | ICD-10-CM | POA: Diagnosis not present

## 2017-12-14 DIAGNOSIS — Z8249 Family history of ischemic heart disease and other diseases of the circulatory system: Secondary | ICD-10-CM | POA: Diagnosis not present

## 2017-12-14 DIAGNOSIS — N179 Acute kidney failure, unspecified: Secondary | ICD-10-CM | POA: Diagnosis present

## 2017-12-14 DIAGNOSIS — F319 Bipolar disorder, unspecified: Secondary | ICD-10-CM | POA: Diagnosis present

## 2017-12-14 DIAGNOSIS — Z803 Family history of malignant neoplasm of breast: Secondary | ICD-10-CM | POA: Diagnosis not present

## 2017-12-14 DIAGNOSIS — J181 Lobar pneumonia, unspecified organism: Secondary | ICD-10-CM | POA: Diagnosis present

## 2017-12-14 LAB — CBC
HCT: 33.2 % — ABNORMAL LOW (ref 35.0–47.0)
Hemoglobin: 11.3 g/dL — ABNORMAL LOW (ref 12.0–16.0)
MCH: 29.9 pg (ref 26.0–34.0)
MCHC: 33.9 g/dL (ref 32.0–36.0)
MCV: 88 fL (ref 80.0–100.0)
Platelets: 201 10*3/uL (ref 150–440)
RBC: 3.78 MIL/uL — ABNORMAL LOW (ref 3.80–5.20)
RDW: 16.5 % — ABNORMAL HIGH (ref 11.5–14.5)
WBC: 7.5 10*3/uL (ref 3.6–11.0)

## 2017-12-14 LAB — ECHOCARDIOGRAM COMPLETE
Height: 70 in
Weight: 4096 oz

## 2017-12-14 LAB — URINALYSIS, COMPLETE (UACMP) WITH MICROSCOPIC
Bacteria, UA: NONE SEEN
Bilirubin Urine: NEGATIVE
Glucose, UA: 50 mg/dL — AB
Hgb urine dipstick: NEGATIVE
Ketones, ur: 5 mg/dL — AB
Nitrite: NEGATIVE
Protein, ur: 30 mg/dL — AB
Specific Gravity, Urine: 1.015 (ref 1.005–1.030)
WBC, UA: >50 WBC/hpf — ABNORMAL HIGH (ref 0–5)
pH: 5 (ref 5.0–8.0)

## 2017-12-14 LAB — BASIC METABOLIC PANEL
Anion gap: 8 (ref 5–15)
BUN: 17 mg/dL (ref 6–20)
CO2: 23 mmol/L (ref 22–32)
Calcium: 8 mg/dL — ABNORMAL LOW (ref 8.9–10.3)
Chloride: 108 mmol/L (ref 98–111)
Creatinine, Ser: 0.99 mg/dL (ref 0.44–1.00)
GFR calc Af Amer: >60 mL/min (ref >60)
GFR calc non Af Amer: >60 mL/min (ref >60)
Glucose, Bld: 191 mg/dL — ABNORMAL HIGH (ref 70–99)
Potassium: 4.1 mmol/L (ref 3.5–5.1)
Sodium: 139 mmol/L (ref 135–145)

## 2017-12-14 LAB — GLUCOSE, CAPILLARY
Glucose-Capillary: 178 mg/dL — ABNORMAL HIGH (ref 70–99)
Glucose-Capillary: 191 mg/dL — ABNORMAL HIGH (ref 70–99)
Glucose-Capillary: 182 mg/dL — ABNORMAL HIGH (ref 70–99)
Glucose-Capillary: 179 mg/dL — ABNORMAL HIGH (ref 70–99)

## 2017-12-14 LAB — LACTIC ACID, PLASMA: Lactic Acid, Venous: 0.7 mmol/L (ref 0.5–1.9)

## 2017-12-14 MED ORDER — DULAGLUTIDE 1.5 MG/0.5ML ~~LOC~~ SOAJ: 1.5000 mg | SUBCUTANEOUS | Status: DC

## 2017-12-14 MED ORDER — GABAPENTIN 100 MG PO CAPS
100.0000 mg | ORAL_CAPSULE | Freq: Every morning | ORAL | Status: DC
  Administered 2017-12-14 – 2017-12-16 (×3): 100 mg via ORAL
  Filled 2017-12-14 (×3): qty 1

## 2017-12-14 MED ORDER — INSULIN ASPART 100 UNIT/ML ~~LOC~~ SOLN
5.0000 [IU] | Freq: Three times a day (TID) | SUBCUTANEOUS | Status: DC
  Administered 2017-12-14 – 2017-12-16 (×8): 5 [IU] via SUBCUTANEOUS
  Filled 2017-12-14 (×8): qty 1

## 2017-12-14 MED ORDER — ONDANSETRON HCL 4 MG/2ML IJ SOLN
4.0000 mg | Freq: Four times a day (QID) | INTRAMUSCULAR | Status: DC | PRN
  Administered 2017-12-14: 04:00:00 4 mg via INTRAVENOUS
  Filled 2017-12-14: qty 2

## 2017-12-14 MED ORDER — GABAPENTIN 100 MG PO CAPS
200.0000 mg | ORAL_CAPSULE | Freq: Every day | ORAL | Status: DC
  Administered 2017-12-14 – 2017-12-15 (×2): 200 mg via ORAL
  Filled 2017-12-14 (×2): qty 2

## 2017-12-14 MED ORDER — SODIUM CHLORIDE 0.9 % IV SOLN
INTRAVENOUS | Status: DC
  Administered 2017-12-14 – 2017-12-16 (×6): via INTRAVENOUS

## 2017-12-14 MED ORDER — ACETAMINOPHEN 325 MG PO TABS: 650.0000 mg | ORAL_TABLET | Freq: Four times a day (QID) | ORAL | Status: DC | PRN

## 2017-12-14 MED ORDER — ACETAMINOPHEN 650 MG RE SUPP: 650.0000 mg | Freq: Four times a day (QID) | RECTAL | Status: DC | PRN

## 2017-12-14 MED ORDER — HEPARIN SODIUM (PORCINE) 5000 UNIT/ML IJ SOLN
5000.0000 [IU] | Freq: Three times a day (TID) | INTRAMUSCULAR | Status: DC
  Administered 2017-12-14 – 2017-12-15 (×2): 5000 [IU] via SUBCUTANEOUS
  Filled 2017-12-14 (×3): qty 1

## 2017-12-14 MED ORDER — PAROXETINE HCL 20 MG PO TABS
20.0000 mg | ORAL_TABLET | Freq: Every day | ORAL | Status: DC
  Administered 2017-12-14 – 2017-12-16 (×3): 20 mg via ORAL
  Filled 2017-12-14 (×3): qty 1

## 2017-12-14 MED ORDER — INSULIN ASPART 100 UNIT/ML ~~LOC~~ SOLN
0.0000 [IU] | Freq: Three times a day (TID) | SUBCUTANEOUS | Status: DC
  Administered 2017-12-14 (×3): 2 [IU] via SUBCUTANEOUS
  Administered 2017-12-15: 17:00:00 3 [IU] via SUBCUTANEOUS
  Administered 2017-12-15: 08:00:00 2 [IU] via SUBCUTANEOUS
  Administered 2017-12-15 – 2017-12-16 (×2): 3 [IU] via SUBCUTANEOUS
  Administered 2017-12-16: 13:00:00 2 [IU] via SUBCUTANEOUS
  Filled 2017-12-14 (×8): qty 1

## 2017-12-14 MED ORDER — TRAZODONE HCL 50 MG PO TABS
25.0000 mg | ORAL_TABLET | Freq: Every evening | ORAL | Status: DC | PRN
  Filled 2017-12-14: qty 1

## 2017-12-14 MED ORDER — INSULIN GLARGINE 100 UNIT/ML ~~LOC~~ SOLN
30.0000 [IU] | Freq: Every day | SUBCUTANEOUS | Status: DC
Start: 2017-12-14 — End: 2017-12-16
  Administered 2017-12-14 – 2017-12-16 (×3): 30 [IU] via SUBCUTANEOUS
  Filled 2017-12-14 (×3): qty 0.3

## 2017-12-14 MED ORDER — BISACODYL 5 MG PO TBEC: 5.0000 mg | DELAYED_RELEASE_TABLET | Freq: Every day | ORAL | Status: DC | PRN

## 2017-12-14 MED ORDER — DOCUSATE SODIUM 100 MG PO CAPS
100.0000 mg | ORAL_CAPSULE | Freq: Two times a day (BID) | ORAL | Status: DC
  Administered 2017-12-14 – 2017-12-16 (×5): 100 mg via ORAL
  Filled 2017-12-14 (×5): qty 1

## 2017-12-14 MED ORDER — ONDANSETRON HCL 4 MG PO TABS: 4.0000 mg | ORAL_TABLET | Freq: Four times a day (QID) | ORAL | Status: DC | PRN

## 2017-12-14 MED ORDER — ATORVASTATIN CALCIUM 20 MG PO TABS
40.0000 mg | ORAL_TABLET | Freq: Every day | ORAL | Status: DC
  Administered 2017-12-14 – 2017-12-15 (×2): 40 mg via ORAL
  Filled 2017-12-14 (×2): qty 2

## 2017-12-14 MED ORDER — ARIPIPRAZOLE 10 MG PO TABS
10.0000 mg | ORAL_TABLET | Freq: Every day | ORAL | Status: DC
  Administered 2017-12-14 – 2017-12-16 (×3): 10 mg via ORAL
  Filled 2017-12-14 (×3): qty 1

## 2017-12-14 MED ORDER — HYDROCODONE-ACETAMINOPHEN 5-325 MG PO TABS: 1.0000 | ORAL_TABLET | ORAL | Status: DC | PRN

## 2017-12-14 MED ORDER — GABAPENTIN 100 MG PO CAPS: 100.0000 mg | ORAL_CAPSULE | Freq: Two times a day (BID) | ORAL | Status: DC

## 2017-12-14 NOTE — H&P (Signed)
[place] [facility] [place] NAME: [name]    MR#:  [id number]  DATE OF BIRTH:  1965/08/18  DATE OF ADMISSION:  12/13/2017  PRIMARY CARE PHYSICIAN: [name], DO   REQUESTING/REFERRING PHYSICIAN:   CHIEF COMPLAINT:   Chief Complaint  Patient presents with  . Weakness  . Fall    HISTORY OF PRESENT ILLNESS: [name]  is a 52 y.o. female with a known history of hypertension, hyperlipidemia, diabetes type 2, bipolar disorder and other comorbidities. Patient presented to emergency room for severe generalized weakness and fatigue going on for the past 2 days, gradually getting worse, in the past 6 hours, to the point that she fainted twice at home.  Her sister witnessed the episodes, therefore there was no trauma to the head.  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  Per sister, patient had decreased oral intake throughout the day.  On detailed questioning, patient admits to pain with urination and subjective fevers going on for the past 2 days.   She was started on antibiotics by her primary care physician for urinary tract infection, yesterday, without any improvement.  No nausea, vomiting, abdominal pain, no bleeding. At the arrival to emergency room, patient was noted to be tachycardic with heart rate of 120 and temperature at 100.2. Blood test done emergency room show a creatinine level of 1.24, lactic acid level of 1.1, glucose level at 216, WBC 9.9.  UA is positive for UTI. EKG shows sinus tachycardia with heart rate 120, no acute ischemic changes. Chest x-ray, reviewed by myself, is suggestive of left lung base infiltrate. Patient is admitted for further evaluation and treatment.  PAST MEDICAL HISTORY:   Past Medical History:  Diagnosis Date  . Depression   . Diabetes mellitus without complication ([place])   . Hyperlipidemia   . Hypertension   . Neuromuscular disorder  ([place])   . Schizo affective schizophrenia ([place])    abilify and pazil    PAST SURGICAL HISTORY:  Past Surgical History:  Procedure Laterality Date  . CHOLECYSTECTOMY    . IRRIGATION AND DEBRIDEMENT FOOT Right 02/26/2016   Procedure: RIGHT 2ND TOE DEBRIDEMENT;  Surgeon: Samara Deist, DPM;  Location: [facility] ORS;  Service: Podiatry;  Laterality: Right;    SOCIAL HISTORY:  Social History   Tobacco Use  . Smoking status: Former Smoker    Packs/day: 3.00    Years: 30.00    Pack years: 90.00    Last attempt to quit: 05/02/2009    Years since quitting: 8.6  . Smokeless tobacco: Never Used  Substance Use Topics  . Alcohol use: No    FAMILY HISTORY:  Family History  Problem Relation Age of Onset  . Breast cancer Cousin        maternal cousin  . Hypertension Mother   . Hyperthyroidism Mother   . Diabetes Maternal Grandmother   . Hypertension Maternal Grandmother   . Cancer Maternal Grandmother        unknown type  . Diabetes Maternal Grandfather   . Hypertension Maternal Grandfather   . Diabetes Paternal Grandmother   . Hypertension Paternal Grandmother   . Diabetes Paternal Grandfather   . Hypertension Paternal Grandfather     DRUG ALLERGIES: No Known Allergies  REVIEW OF SYSTEMS:   CONSTITUTIONAL: Positive for subjective fever, fatigue and generalized weakness.  EYES: No changes in vision.  EARS, NOSE, AND THROAT: No tinnitus or ear pain.  RESPIRATORY:  No cough, shortness of breath, wheezing or hemoptysis.  CARDIOVASCULAR: No chest pain, orthopnea, edema.  GASTROINTESTINAL: No nausea, vomiting, diarrhea or abdominal pain.  GENITOURINARY: Positive for dysuria, no hematuria.  ENDOCRINE: No polyuria, nocturia. HEMATOLOGY: No bleeding. SKIN: No rash or lesion. MUSCULOSKELETAL: No joint pain at this time.   NEUROLOGIC: No focal weakness.  PSYCHIATRY: Has active history of bipolar disorder.   MEDICATIONS AT HOME:  Prior to Admission medications   Medication Sig Start  Date End Date Taking? Authorizing Provider  ARIPiprazole (ABILIFY) 10 MG tablet Take 10 mg by mouth daily.    [provider]  atorvastatin (LIPITOR) 40 MG tablet Take 1 tablet (40 mg total) by mouth at bedtime. 01/18/17   Karamalegos, Devonne Doughty, DO  B-D UF III MINI PEN NEEDLES 31G X 5 MM MISC USE AS DIRECTED 09/11/17   Karamalegos, Devonne Doughty, DO  Blood Glucose Monitoring Suppl (ONE TOUCH ULTRA SYSTEM KIT) w/Device KIT 1 kit by Does not apply route once. 08/20/15   Arlis Porta., MD  ciprofloxacin (CIPRO) 500 MG tablet Take 1 tablet (500 mg total) by mouth 2 (two) times daily. For 7 days 12/13/17   [name], DO  Dulaglutide (TRULICITY) 1.5 EE/1.0OF SOPN Inject 1.5 mg into the skin once a week. 05/10/17   Karamalegos, Devonne Doughty, DO  gabapentin (NEURONTIN) 100 MG capsule Take 1 in AM and 2 in PM 05/10/17   Karamalegos, Devonne Doughty, DO  glucose blood test strip Check blood sugar up to 3 times daily 06/27/16   Karamalegos, Devonne Doughty, DO  insulin aspart (NOVOLOG) 100 UNIT/ML FlexPen Inject 5 Units into the skin 3 (three) times daily before meals. + sliding scale: If BG 150-200=add 2 units If BG 201-250=add 4 units If BG 251-300=add 6 units If BG 301-350=add 8 units If BG 351-400=add 10 units If BG >400=add 12 units 11/27/17 11/27/18  [provider]  Insulin Glargine (BASAGLAR KWIKPEN) 100 UNIT/ML SOPN Inject 0.45 mLs (45 Units total) into the skin daily. Adjust dose as advised 11/13/17   Karamalegos, Devonne Doughty, DO  lisinopril (PRINIVIL,ZESTRIL) 20 MG tablet TAKE 1 TABLET(20 MG) BY MOUTH DAILY 11/13/17   Parks Ranger, Devonne Doughty, DO  metFORMIN (GLUCOPHAGE) 1000 MG tablet TAKE 1 TABLET(1000 MG) BY MOUTH TWICE DAILY WITH A MEAL 11/13/17   Karamalegos, Devonne Doughty, DO  PARoxetine (PAXIL) 20 MG tablet Take 1 tablet (20 mg total) by mouth daily. Prescribed by [facility] 10/11/16   Parks Ranger, Devonne Doughty, DO  promethazine (PHENERGAN) 12.5 MG tablet Take 1-2 tablets  (12.5-25 mg total) by mouth every 8 (eight) hours as needed for nausea or vomiting. 12/13/17   Parks Ranger, Devonne Doughty, DO      PHYSICAL EXAMINATION:   VITAL SIGNS: Blood pressure 113/80, pulse (!) 121, temperature 100.3 F (37.9 C), temperature source Oral, resp. rate 19, height '5\' 10"'  (1.778 m), weight 116.1 kg, SpO2 97 %.  GENERAL:  52 y.o.-year-old patient lying in the bed with no acute distress, but looks lethargic.  EYES: Pupils equal, round, reactive to light and accommodation. No scleral icterus. Extraocular muscles intact.  HEENT: Head atraumatic, normocephalic. Oropharynx and nasopharynx clear.  NECK:  Supple, no jugular venous distention. No thyroid enlargement, no tenderness.  LUNGS: Normal breath sounds bilaterally, no wheezing, rales,rhonchi or crepitation. No use of accessory muscles of respiration.  CARDIOVASCULAR: S1, S2 normal. No S3/S4.  ABDOMEN: Soft, nontender, nondistended. Bowel sounds present. No organomegaly or mass.  EXTREMITIES: No pedal edema, cyanosis, or clubbing.  NEUROLOGIC: Cranial  nerves II through XII are intact. Muscle strength 5/5 in all extremities. Sensation intact.   PSYCHIATRIC: The patient is alert and oriented x 3, but she is lethargic.  SKIN: No obvious rash, lesion, or ulcer.   LABORATORY PANEL:   CBC Recent Labs  Lab 12/13/17 2200  WBC 9.9  HGB 12.4  HCT 36.9  PLT 219  MCV 87.3  MCH 29.4  MCHC 33.7  RDW 16.6*   ------------------------------------------------------------------------------------------------------------------  Chemistries  Recent Labs  Lab 12/13/17 2200  NA 134*  133*  K 4.6  4.6  CL 101  101  CO2 24  24  GLUCOSE 211*  216*  BUN 18  20  CREATININE 1.23*  1.24*  CALCIUM 8.6*  8.6*  AST 25  ALT 27  ALKPHOS 84  BILITOT 0.6   ------------------------------------------------------------------------------------------------------------------ estimated creatinine clearance is 72.5 mL/min (A) (by C-G  formula based on SCr of 1.24 mg/dL (H)). ------------------------------------------------------------------------------------------------------------------ No results for input(s): TSH, T4TOTAL, T3FREE, THYROIDAB in the last 72 hours.  Invalid input(s): FREET3   Coagulation profile No results for input(s): INR, PROTIME in the last 168 hours. ------------------------------------------------------------------------------------------------------------------- No results for input(s): DDIMER in the last 72 hours. -------------------------------------------------------------------------------------------------------------------  Cardiac Enzymes Recent Labs  Lab 12/13/17 2200  TROPONINI <0.03   ------------------------------------------------------------------------------------------------------------------ Invalid input(s): POCBNP  ---------------------------------------------------------------------------------------------------------------  Urinalysis    Component Value Date/Time   COLORURINE STRAW (A) 02/24/2016 1808   APPEARANCEUR CLEAR (A) 02/24/2016 1808   APPEARANCEUR Clear 08/19/2012 1204   LABSPEC 1.029 02/24/2016 1808   LABSPEC 1.015 08/19/2012 1204   PHURINE 5.0 02/24/2016 1808   GLUCOSEU >500 (A) 02/24/2016 1808   GLUCOSEU >=500 08/19/2012 1204   HGBUR NEGATIVE 02/24/2016 1808   BILIRUBINUR Negative 12/13/2017 1533   BILIRUBINUR Negative 08/19/2012 1204   KETONESUR NEGATIVE 02/24/2016 1808   PROTEINUR Positive (A) 12/13/2017 1533   PROTEINUR NEGATIVE 02/24/2016 1808   UROBILINOGEN 0.2 12/13/2017 1533   NITRITE positive 12/13/2017 1533   NITRITE NEGATIVE 02/24/2016 1808   LEUKOCYTESUR Large (3+) (A) 12/13/2017 1533   LEUKOCYTESUR Trace 08/19/2012 1204     RADIOLOGY: Dg Chest Port 1 View  Result Date: 12/13/2017 CLINICAL DATA:  Sepsis EXAM: PORTABLE CHEST 1 VIEW COMPARISON:  07/10/2009 FINDINGS: Airspace disease at the left base. No pleural effusion. Stable  cardiomediastinal silhouette with mild central congestion. No pneumothorax. IMPRESSION: Airspace disease at the left lung base is suspicious for an infiltrate. Electronically Signed   By: Donavan Foil M.D.   On: 12/13/2017 22:43    EKG: Orders placed or performed during the hospital encounter of 12/13/17  . ED EKG  . ED EKG    IMPRESSION AND PLAN:  1.  Acute pyelonephritis, we will start treatment with Rocephin IV, while waiting for urine culture results. 2.  Acute renal failure, likely prerenal, secondary to poor p.o. Intake.  We will start IV hydration and monitor kidney function closely.  Avoid nephrotoxic medications. 3.  CAP, will treat with IV Rocephin and azithromycin. 4.  Diabetes type 2.  Will start low-carb diet.  Will monitor blood sugars before meals and at bedtime and use insulin treatment during the hospital stay. 5.  Syncope, likely vasovagal, secondary to dehydration and infection.  We will continue to monitor patient on telemetry and check 2D echo.  All the records are reviewed and case discussed with ED provider. Management plans discussed with the patient, family and they are in agreement.  CODE STATUS: Code Status History    Date Active Date Inactive Code Status Order ID  Comments User Context   02/24/2016 2104 02/27/2016 1407 Full Code [id number]  Theodoro Grist, MD Inpatient       TOTAL TIME TAKING CARE OF THIS PATIENT: 45 minutes.    Amelia Jo M.D on 12/14/2017 at 12:18 AM  Between 7am to 6pm - Pager - [phone]  After 6pm go to [email or web address] - password [email or web address] [facility] [place] [facility]  [phone]  CC: Primary care physician; [name], DO

## 2017-12-14 NOTE — Progress Notes (Signed)
Results for [name], [name] (MRN [id number]) as of 12/14/2017 16:43  Ref. Range 11/07/2017 09:25  Hemoglobin A1C Latest Ref Range: <5.7 % of total Hgb >14.0 (H)    Met with pt today to discuss most recent A1c of >14% take at her PCP's office.  Patient met with her Endocrinologist (initial appt was 11/27/17) and was prescribed Novolog as well.  Per patient, she was nervous about taking the Novolog and has yet to start the Novolog at home.  Pt told me the doctor here in the hospital prescribed her same Novolog regimen here in the hospital and so far her CBGs are OK and she is less afraid to take the Novolog at home.  Encouraged pt to take the Novolog as prescribed as her last A1c was quite high and she definitely needs to get her CBGs under better control.  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  Is motivated to get her CBGs under better control so she can stay healthy.  Has follow up appt with [facility] in 1 month.     Initial Consult with Drema Halon, NP with [facility]) on 11/27/2017.  Novolog Insulin added to patient's home regimen at that visit.  Patient given the following instructions at the last ENDO visit:  "-Continue Metformin 1000 mg twice daily -Continue Basaglar with INCREASE to 50 units daily -Add NovoLog 5 units three times daily WITH MEALS plus SLIDING SCALE: Take 2 units of NovoLog if your sugar is 150-200 Take 4 units of NovoLog if your sugar is 201-250 Take 6 units of NovoLog if your sugar is 251-300 Take 8 units of NovoLog if your sugar is 301-350 Take 10 units of NovoLog if your sugar is 351-400 Take 12 units of NovoLog if your sugar is over 401 -Check blood sugar before breakfast,, lunch, dinner and again before bed. Please bring meter to next clinic visit. New meter has been ordered at your pharmacy -Referral to [facility] for diabetes and nutrition  education. [facility] will contact you for your first appointment"     --Will follow patient during hospitalization--  Wyn Quaker RN, MSN, CDE Diabetes Coordinator Inpatient Glycemic Control Team Team Pager: [phone] (8a-5p)

## 2017-12-14 NOTE — Plan of Care (Signed)
  Problem: Education: Goal: Knowledge of General Education information will improve Description Including pain rating scale, medication(s)/side effects and non-pharmacologic comfort measures Outcome: Progressing   Problem: Clinical Measurements: Goal: Respiratory complications will improve Outcome: Progressing Goal: Cardiovascular complication will be avoided Outcome: Progressing  Echdo performed at bedside this shift Problem: Activity: Goal: Risk for activity intolerance will decrease Outcome: Progressing  Pt oob to bathroom this shift  Problem: Pain Managment: Goal: General experience of comfort will improve Outcome: Progressing   Problem: Safety: Goal: Ability to remain free from injury will improve Outcome: Progressing   Problem: Skin Integrity: Goal: Risk for impaired skin integrity will decrease Outcome: Progressing

## 2017-12-14 NOTE — Progress Notes (Signed)
[place] at [place] NAME: [name]    MR#:  [id number]  DATE OF BIRTH:  11/12/1965  SUBJECTIVE:  CHIEF COMPLAINT: Patient is feeling better.  Denies any nausea vomiting or abdominal pain  REVIEW OF SYSTEMS:  CONSTITUTIONAL: Reporting generalized weakness EYES: No blurred or double vision.  EARS, NOSE, AND THROAT: No tinnitus or ear pain.  RESPIRATORY: No cough, shortness of breath, wheezing or hemoptysis.  CARDIOVASCULAR: No chest pain, orthopnea, edema.  GASTROINTESTINAL: No nausea, vomiting, diarrhea or abdominal pain.  GENITOURINARY: No dysuria, hematuria.  ENDOCRINE: No polyuria, nocturia,  HEMATOLOGY: No anemia, easy bruising or bleeding SKIN: No rash or lesion. MUSCULOSKELETAL: No joint pain or arthritis.   NEUROLOGIC: No tingling, numbness, weakness.  PSYCHIATRY: No anxiety or depression.   DRUG ALLERGIES:  No Known Allergies  VITALS:  Blood pressure 114/69, pulse 91, temperature 98.7 F (37.1 C), temperature source Oral, resp. rate 20, height 5\' 10"  (1.778 m), weight 116.1 kg, SpO2 93 %.  PHYSICAL EXAMINATION:  GENERAL:  52 y.o.-year-old patient lying in the bed with no acute distress.  EYES: Pupils equal, round, reactive to light and accommodation. No scleral icterus. Extraocular muscles intact.  HEENT: Head atraumatic, normocephalic. Oropharynx and nasopharynx clear.  NECK:  Supple, no jugular venous distention. No thyroid enlargement, no tenderness.  LUNGS: Normal breath sounds bilaterally, no wheezing, rales,rhonchi or crepitation. No use of accessory muscles of respiration.  CARDIOVASCULAR: S1, S2 normal. No murmurs, rubs, or gallops.  ABDOMEN: Soft, nontender, nondistended. Bowel sounds present. No organomegaly or mass.  EXTREMITIES: No pedal edema, cyanosis, or clubbing.  NEUROLOGIC: Cranial nerves II through XII are intact. Muscle strength 5/5 in all extremities. Sensation intact. Gait not checked.   PSYCHIATRIC: The patient is alert and oriented x 3.  SKIN: No obvious rash, lesion, or ulcer.    LABORATORY PANEL:   CBC Recent Labs  Lab 12/14/17 0513  WBC 7.5  HGB 11.3*  HCT 33.2*  PLT 201   ------------------------------------------------------------------------------------------------------------------  Chemistries  Recent Labs  Lab 12/13/17 2200 12/14/17 0513  NA 134*  133* 139  K 4.6  4.6 4.1  CL 101  101 108  CO2 24  24 23   GLUCOSE 211*  216* 191*  BUN 18  20 17   CREATININE 1.23*  1.24* 0.99  CALCIUM 8.6*  8.6* 8.0*  AST 25  --   ALT 27  --   ALKPHOS 84  --   BILITOT 0.6  --    ------------------------------------------------------------------------------------------------------------------  Cardiac Enzymes Recent Labs  Lab 12/13/17 2200  TROPONINI <0.03   ------------------------------------------------------------------------------------------------------------------  RADIOLOGY:  Dg Chest Port 1 View  Result Date: 12/13/2017 CLINICAL DATA:  Sepsis EXAM: PORTABLE CHEST 1 VIEW COMPARISON:  07/10/2009 FINDINGS: Airspace disease at the left base. No pleural effusion. Stable cardiomediastinal silhouette with mild central congestion. No pneumothorax. IMPRESSION: Airspace disease at the left lung base is suspicious for an infiltrate. Electronically Signed   By: Donavan Foil M.D.   On: 12/13/2017 22:43    EKG:   Orders placed or performed during the hospital encounter of 12/13/17  . ED EKG  . ED EKG    ASSESSMENT AND PLAN:   1.  Acute pyelonephritis,  Rocephin IV, while waiting for urine culture results. Fluids via IV  2.  Acute renal failure, likely prerenal, secondary to poor p.o. Intake. IV fluids and monitor renal function and avoid nephrotoxins   3.  CAP, will treat with IV Rocephin and azithromycin.  4.  Diabetes type 2.   low-carb diet.  Will monitor blood sugars before meals and at bedtime and use insulin treatment during the  hospital stay.  5.  Syncope, likely vasovagal, secondary to dehydration and infection.  We will continue to monitor patient on telemetry   2D echo.     All the records are reviewed and case discussed with Care Management/Social Workerr. Management plans discussed with the patient, family and they are in agreement.  CODE STATUS:   TOTAL TIME TAKING CARE OF THIS PATIENT: 46minutes.   POSSIBLE D/C IN 1-2 DAYS, DEPENDING ON CLINICAL CONDITION.  Note: This dictation was prepared with Dragon dictation along with smaller phrase technology. Any transcriptional errors that result from this process are unintentional.   Nicholes Mango M.D on 12/14/2017 at 4:19 PM  Between 7am to 6pm - Pager - [phone] After 6pm go to [email or web address] - password [email or web address] [facility]  Office  [phone]  CC: Primary care physician; [name], DO

## 2017-12-14 NOTE — Progress Notes (Signed)
*  PRELIMINARY RESULTS* Echocardiogram 2D Echocardiogram has been performed.  [name] 12/14/2017, 9:53 AM

## 2017-12-14 NOTE — ED Notes (Signed)
Hospitalist to bedside at this time 

## 2017-12-14 NOTE — Telephone Encounter (Signed)
Patient was seen by me in office for an acute same day appointment yesterday 12/13/17. See my note for all of my documentation regarding that encounter and the treatment and follow-up plan if she was not improving.  Of note - as documented, the patient was given explicit instructions to call back or go immediately to hospital ED if she was not improving on the treatment within 24 hours.  I have since been notified by the [facility]/ED that the patient arrived later in the evening 12/13/17 after some worsening condition by their note, and she was admitted to hospital. I have reviewed this report in ED provider note and the H&P for her admission.  Now today, patient's sister, [name], called our office and was upset about her care here on 12/13/17. She expressed concerns about the patient being more ill and now being in the hospital. Our staff spoke with her as I was seeing patients at that time of her call.  I have confirmed with the patient's DPR form, she has not listed this person as a designated party to release information to from 06/2016. Primarily her mother, [name] is primarily involved in her care and will accompany her at appointments.  I was notified by staff that the caller [name] was present yesterday, and she was confused about the appointment time, as the patient was scheduled for 3:40, however she arrived after 2pm. She was under the impression that the apt was at 2:40. The patient was triaged by our staff prior to appointment, and she was seen in timely manner at her appointment.  During the clinical encounter, only the patient and her mother, [name], were present in the exam room, and I provided them all of the information and treatment and follow-up recommendations.  ------------------------ I attempted to call [name] back at the number provided [phone], today approx 2pm, and did not reach her on two attempts, call went straight to non identified voicemail. I left a  voicemail stating calling from our office, and that I am returning her call, and that she may try again to reach me, I will be out on admin time this afternoon and back tomorrow.  [name], [employer] 12/14/2017, 2:05 PM

## 2017-12-14 NOTE — ED Notes (Signed)
Pt ambulatory to toilet with one assist.  

## 2017-12-14 NOTE — Progress Notes (Signed)
Inpatient Diabetes Program Recommendations  AACE/ADA: New Consensus Statement on Inpatient Glycemic Control (2015)  Target Ranges:  Prepandial:   less than 140 mg/dL      Peak postprandial:   less than 180 mg/dL (1-2 hours)      Critically ill patients:  140 - 180 mg/dL   Results for [name], [name] (MRN [id number]) as of 12/14/2017 09:57  Ref. Range 12/14/2017 08:36  Glucose-Capillary Latest Ref Range: 70 - 99 mg/dL 178 (H)  7 units NOVOLOG +  30 units LANTUS   Results for [name], [name] (MRN [id number]) as of 12/14/2017 09:57  Ref. Range 11/07/2017 09:25  Hemoglobin A1C Latest Ref Range: <5.7 % of total Hgb >14.0 (H)  Average glucose 360 mg/dl     Admit with: Acute Pyelonephritis  History: DM, Bipolar Disorder  Home DM Meds: Basaglar (insulin glargine) 50 units daily       Novolog 5 units TID        Novolog SSI (2-12 units TID)       Trulicity 1.5 mg Qweek (NOT taking)       Metformin 1000 mg BID  Current Orders: Lantus 30 units daily      Novolog Sensitive Correction Scale/ SSI (0-9 units) TID AC       Novolog 5 units TID with meals       Initial Consult with Drema Halon, NP with [facility]) on 11/27/2017.  Novolog Insulin added to patient's home regimen at that visit.  Patient given the following instructions at the last ENDO visit:  "-Continue Metformin 1000 mg twice daily -Continue Basaglar with INCREASE to 50 units daily -Add NovoLog 5 units three times daily WITH MEALS plus SLIDING SCALE: Take 2 units of NovoLog if your sugar is 150-200 Take 4 units of NovoLog if your sugar is 201-250 Take 6 units of NovoLog if your sugar is 251-300 Take 8 units of NovoLog if your sugar is 301-350 Take 10 units of NovoLog if your sugar is 351-400 Take 12 units of NovoLog if your sugar is over 401 -Check blood sugar before breakfast,, lunch, dinner and again before bed. Please bring meter to next clinic visit. New meter has been ordered at  your pharmacy -Referral to [facility] for diabetes and nutrition education. [facility] will contact you for your first appointment"     --Will follow patient during hospitalization--  Wyn Quaker RN, MSN, CDE Diabetes Coordinator Inpatient Glycemic Control Team Team Pager: [phone] (8a-5p)

## 2017-12-15 LAB — URINE CULTURE
MICRO NUMBER:: 90965771
SPECIMEN QUALITY:: ADEQUATE

## 2017-12-15 LAB — GLUCOSE, CAPILLARY
Glucose-Capillary: 188 mg/dL — ABNORMAL HIGH (ref 70–99)
Glucose-Capillary: 213 mg/dL — ABNORMAL HIGH (ref 70–99)
Glucose-Capillary: 233 mg/dL — ABNORMAL HIGH (ref 70–99)
Glucose-Capillary: 211 mg/dL — ABNORMAL HIGH (ref 70–99)
Glucose-Capillary: 213 mg/dL — ABNORMAL HIGH (ref 70–99)

## 2017-12-15 LAB — HIV ANTIBODY (ROUTINE TESTING W REFLEX): HIV Screen 4th Generation wRfx: NONREACTIVE

## 2017-12-15 MED ORDER — ENOXAPARIN SODIUM 40 MG/0.4ML ~~LOC~~ SOLN
40.0000 mg | SUBCUTANEOUS | Status: DC
  Administered 2017-12-15: 21:00:00 40 mg via SUBCUTANEOUS
  Filled 2017-12-15: qty 0.4

## 2017-12-15 MED ORDER — CEFDINIR 300 MG PO CAPS
300.0000 mg | ORAL_CAPSULE | Freq: Two times a day (BID) | ORAL | Status: DC
  Administered 2017-12-15 – 2017-12-16 (×3): 300 mg via ORAL
  Filled 2017-12-15 (×4): qty 1

## 2017-12-15 NOTE — Plan of Care (Signed)

## 2017-12-15 NOTE — Progress Notes (Signed)
[place] at [place] NAME: [name]    MR#:  [id number]  DATE OF BIRTH:  10-May-1965  SUBJECTIVE:  CHIEF COMPLAINT: Patient is feeling better.  Denies any nausea vomiting  No overnight events  REVIEW OF SYSTEMS:  CONSTITUTIONAL: Reporting generalized weakness EYES: No blurred or double vision.  EARS, NOSE, AND THROAT: No tinnitus or ear pain.  RESPIRATORY: No cough, shortness of breath, wheezing or hemoptysis.  CARDIOVASCULAR: No chest pain, orthopnea, edema.  GASTROINTESTINAL: No nausea, vomiting, diarrhea or abdominal pain.  GENITOURINARY: No dysuria, hematuria.  ENDOCRINE: No polyuria, nocturia,  HEMATOLOGY: No anemia, easy bruising or bleeding SKIN: No rash or lesion. MUSCULOSKELETAL: No joint pain or arthritis.   NEUROLOGIC: No tingling, numbness, weakness.  PSYCHIATRY: No anxiety or depression.   DRUG ALLERGIES:  No Known Allergies  VITALS:  Blood pressure (!) 155/97, pulse 83, temperature 97.6 F (36.4 C), temperature source Oral, resp. rate 20, height 5\' 10"  (1.778 m), weight 116.7 kg, SpO2 96 %.  PHYSICAL EXAMINATION:  GENERAL:  52 y.o.-year-old patient lying in the bed with no acute distress.  EYES: Pupils equal, round, reactive to light and accommodation. No scleral icterus. Extraocular muscles intact.  HEENT: Head atraumatic, normocephalic. Oropharynx and nasopharynx clear.  NECK:  Supple, no jugular venous distention. No thyroid enlargement, no tenderness.  LUNGS: Normal breath sounds bilaterally, no wheezing, rales,rhonchi or crepitation. No use of accessory muscles of respiration.  CARDIOVASCULAR: S1, S2 normal. No murmurs, rubs, or gallops.  ABDOMEN: Soft, nontender, nondistended. Bowel sounds present. No organomegaly or mass.  EXTREMITIES: No pedal edema, cyanosis, or clubbing.  NEUROLOGIC: Cranial nerves II through XII are intact. Muscle strength 5/5 in all extremities. Sensation intact. Gait not  checked.  PSYCHIATRIC: The patient is alert and oriented x 3.  SKIN: No obvious rash, lesion, or ulcer.    LABORATORY PANEL:   CBC Recent Labs  Lab 12/14/17 0513  WBC 7.5  HGB 11.3*  HCT 33.2*  PLT 201   ------------------------------------------------------------------------------------------------------------------  Chemistries  Recent Labs  Lab 12/13/17 2200 12/14/17 0513  NA 134*  133* 139  K 4.6  4.6 4.1  CL 101  101 108  CO2 24  24 23   GLUCOSE 211*  216* 191*  BUN 18  20 17   CREATININE 1.23*  1.24* 0.99  CALCIUM 8.6*  8.6* 8.0*  AST 25  --   ALT 27  --   ALKPHOS 84  --   BILITOT 0.6  --    ------------------------------------------------------------------------------------------------------------------  Cardiac Enzymes Recent Labs  Lab 12/13/17 2200  TROPONINI <0.03   ------------------------------------------------------------------------------------------------------------------  RADIOLOGY:  Dg Chest Port 1 View  Result Date: 12/13/2017 CLINICAL DATA:  Sepsis EXAM: PORTABLE CHEST 1 VIEW COMPARISON:  07/10/2009 FINDINGS: Airspace disease at the left base. No pleural effusion. Stable cardiomediastinal silhouette with mild central congestion. No pneumothorax. IMPRESSION: Airspace disease at the left lung base is suspicious for an infiltrate. Electronically Signed   By: Donavan Foil M.D.   On: 12/13/2017 22:43    EKG:   Orders placed or performed during the hospital encounter of 12/13/17  . ED EKG  . ED EKG    ASSESSMENT AND PLAN:   1.  Acute pyelonephritis, Urine culture with multiple species which is a contaminant Patient is on Rocephin for community-acquired pneumonia  2.  Acute renal failure, likely prerenal, secondary to poor p.o. Intake. Resolved with IV fluids   3.  CAP, will treat with IV Rocephin and azithromycin.  Clinically improving will anticipate discharge in a.m.  4.  Diabetes type 2.   low-carb diet.  Will monitor  blood sugars before meals and at bedtime and use insulin treatment during the hospital stay. Outpatient follow-up with lifestyle and [facility] endocrinology in 1 to 2 weeks after discharge  5.  Syncope, likely vasovagal, secondary to dehydration and infection.  We will continue to monitor patient on telemetry   2D echo-with 55 to 65% ejection fraction     All the records are reviewed and case discussed with Care Management/Social Workerr. Management plans discussed with the patient, family and they are in agreement.  CODE STATUS:   TOTAL TIME TAKING CARE OF THIS PATIENT: 39minutes.   POSSIBLE D/C IN 1- DAYS, DEPENDING ON CLINICAL CONDITION.  Note: This dictation was prepared with Dragon dictation along with smaller phrase technology. Any transcriptional errors that result from this process are unintentional.   Nicholes Mango M.D on 12/15/2017 at 2:16 PM  Between 7am to 6pm - Pager - [phone] After 6pm go to [email or web address] - password [email or web address] [facility]  Office  [phone]  CC: Primary care physician; [name], DO

## 2017-12-15 NOTE — Care Management (Signed)
Due to heavy patient load this day, CM unable to assess patient. Asked weekend CM to assess.

## 2017-12-16 LAB — GLUCOSE, CAPILLARY
Glucose-Capillary: 201 mg/dL — ABNORMAL HIGH (ref 70–99)
Glucose-Capillary: 194 mg/dL — ABNORMAL HIGH (ref 70–99)

## 2017-12-16 MED ORDER — SACCHAROMYCES BOULARDII 250 MG PO CAPS: 250.0000 mg | ORAL_CAPSULE | Freq: Two times a day (BID) | ORAL | 0 refills | Status: AC

## 2017-12-16 MED ORDER — BASAGLAR KWIKPEN 100 UNIT/ML ~~LOC~~ SOPN: 33.0000 [IU] | PEN_INJECTOR | Freq: Every day | SUBCUTANEOUS | 1 refills | Status: DC

## 2017-12-16 MED ORDER — ACETAMINOPHEN 325 MG PO TABS: 650.0000 mg | ORAL_TABLET | Freq: Four times a day (QID) | ORAL | Status: DC | PRN

## 2017-12-16 MED ORDER — CEFDINIR 300 MG PO CAPS: 300.0000 mg | ORAL_CAPSULE | Freq: Two times a day (BID) | ORAL | 0 refills | Status: AC

## 2017-12-16 NOTE — Care Management Note (Signed)
Case Management Note  Patient Details  Name: [name] MRN: [id number] Date of Birth: [birth year]  Subjective/Objective:  Patient to be discharged per MD order. Orders in place for home health services. Patient currently lives with her mother who has dementia. Patient is primary caregiver. Patient sister is at bedside who is from [place] and tells me the patient has some mild psychiatric issues so she has concerns about retunring home but is agreeable to home health services. Referral placed with [place] from [facility] who agrees to accept the patient for PT,RN and social work services. No DME needs. Family to provide transport.  Ines Bloomer RN BSN RNCM [phone]                     Action/Plan:   Expected Discharge Date:  12/16/17               Expected Discharge Plan:  [place]  In-House Referral:     Discharge planning Services  CM Consult  Post Acute Care Choice:  Home Health Choice offered to:  Patient, Sibling  DME Arranged:    DME Agency:     HH Arranged:  RN, PT, Social Work [employer] Agency:  [facility]  Status of Service:  Completed, signed off  If discussed at [employer], dates discussed:    Additional Comments:  Gabriela Drown Gatsby Chismar, RN 12/16/2017, 11:59 AM

## 2017-12-16 NOTE — Progress Notes (Signed)
MD order received to discharge pt home with home health today; Care Management previously established [place] PT, RN and Social Worker services with [facility]; verbally reviewed AVS with pt, gave Rxs to pt; no questions voiced at this time; pt discharged via wheelchair by nursing to the visitor's entrance

## 2017-12-16 NOTE — Discharge Instructions (Signed)
Follow-up with primary care physician in 3 to 4 days Complete antibiotic course Home health physical therapy Outpatient follow-up with [facility] in 2 to 3 days Outpatient follow-up with [facility] in 2 weeks

## 2017-12-16 NOTE — Discharge Summary (Signed)
[facility] NAME: [name]    MR#:  [id number]  DATE OF BIRTH:  Dec 26, 1965  DATE OF ADMISSION:  12/13/2017 ADMITTING PHYSICIAN: Amelia Jo, MD  DATE OF DISCHARGE:  12/16/17  PRIMARY CARE PHYSICIAN: [name], DO    ADMISSION DIAGNOSIS:  Pyelonephritis [N12] Community acquired pneumonia of left lower lobe of lung ([place]) [J18.1]  DISCHARGE DIAGNOSIS:  Active Problems:   Pyelonephritis   SECONDARY DIAGNOSIS:   Past Medical History:  Diagnosis Date  . Depression   . Diabetes mellitus without complication ([place])   . Hyperlipidemia   . Hypertension   . Neuromuscular disorder ([place])   . Schizo affective schizophrenia ([place])    abilify and pazil    HOSPITAL COURSE:   HISTORY OF PRESENT ILLNESS: [name]  is a 52 y.o. female with a known history of hypertension, hyperlipidemia, diabetes type 2, bipolar disorder and other comorbidities. Patient presented to emergency room for severe generalized weakness and fatigue going on for the past 2 days, gradually getting worse, in the past 6 hours, to the point that she fainted twice at home.  Her sister witnessed the episodes, therefore there was no trauma to the head.  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  Per sister, patient had decreased oral intake throughout the day.  On detailed questioning, patient admits to pain with urination and subjective fevers going on for the past 2 days.   She was started on antibiotics by her primary care physician for urinary tract infection, yesterday, without any improvement.  No nausea, vomiting, abdominal pain, no bleeding. At the arrival to emergency room, patient was noted to be tachycardic with heart rate of 120 and temperature at 100.2. Blood test done emergency room show a creatinine level of 1.24, lactic acid level of 1.1, glucose level at 216, WBC 9.9.  UA is positive  for UTI. EKG shows sinus tachycardia with heart rate 120, no acute ischemic changes. Chest x-ray, reviewed by myself, is suggestive of left lung base infiltrate. Patient is admitted for further evaluation and treatment.  1.Acute pyelonephritis, Urine culture with multiple species which is a contaminant Patient treated with iv Rocephin for community-acquired pneumonia  2.Acute renal failure,likely prerenal, secondary to poor p.o. Intake. Resolved with IV fluids   3.CAP,will treat with IV Rocephin and azithromycin. Clinically improving.  Completed azithromycin course.  Clinically improved with IV Rocephin and azithromycin  discharge patient with the p.o. Omnicef 7 DAYS,florastar  4.Diabetes type 2. low-carb diet. Will monitor blood sugars before meals and at bedtime and use insulin treatment during the hospital stay. Outpatient follow-up with lifestyle and [facility] endocrinology in 1 to 2 weeks after discharge Lantus dose decreased to 33 units on daily basis  5.Syncope,likely vasovagal, secondary to dehydration and infection.  No arrhythmias on telemetry IVF   2D echo-with 55 to 65% ejection fraction Patient is ambulating fine in the room other than weakness.  Refused PT assessment in the hospital and will arrange home health PT  DISCHARGE CONDITIONS:   stable  CONSULTS OBTAINED:     PROCEDURES  NONE   DRUG ALLERGIES:  No Known Allergies  DISCHARGE MEDICATIONS:   Allergies as of 12/16/2017   No Known Allergies     Medication List    STOP taking these medications   ciprofloxacin 500 MG tablet Commonly known as:  CIPRO   Dulaglutide 1.5 MG/0.5ML Sopn     TAKE these medications  acetaminophen 325 MG tablet Commonly known as:  TYLENOL Take 2 tablets (650 mg total) by mouth every 6 (six) hours as needed for mild pain (or Fever >/= 101).   ARIPiprazole 10 MG tablet Commonly known as:  ABILIFY Take 10 mg by mouth daily.   atorvastatin 40 MG  tablet Commonly known as:  LIPITOR Take 1 tablet (40 mg total) by mouth at bedtime.   B-D UF III MINI PEN NEEDLES 31G X 5 MM Misc Generic drug:  Insulin Pen Needle USE AS DIRECTED   BASAGLAR KWIKPEN 100 UNIT/ML Sopn Inject 0.33 mLs (33 Units total) into the skin daily. Adjust dose as advised What changed:  how much to take   cefdinir 300 MG capsule Commonly known as:  OMNICEF Take 1 capsule (300 mg total) by mouth every 12 (twelve) hours for 7 days.   gabapentin 100 MG capsule Commonly known as:  NEURONTIN Take 1 in AM and 2 in PM   glucose blood test strip Check blood sugar up to 3 times daily   insulin aspart 100 UNIT/ML FlexPen Commonly known as:  NOVOLOG Inject 5 Units into the skin 3 (three) times daily before meals. + sliding scale: If BG 150-200=add 2 units If BG 201-250=add 4 units If BG 251-300=add 6 units If BG 301-350=add 8 units If BG 351-400=add 10 units If BG >400=add 12 units   lisinopril 20 MG tablet Commonly known as:  PRINIVIL,ZESTRIL TAKE 1 TABLET(20 MG) BY MOUTH DAILY   metFORMIN 1000 MG tablet Commonly known as:  GLUCOPHAGE TAKE 1 TABLET(1000 MG) BY MOUTH TWICE DAILY WITH A MEAL   ONE TOUCH ULTRA SYSTEM KIT w/Device Kit 1 kit by Does not apply route once.   PARoxetine 20 MG tablet Commonly known as:  PAXIL Take 1 tablet (20 mg total) by mouth daily. Prescribed by [facility]   promethazine 12.5 MG tablet Commonly known as:  PHENERGAN Take 1-2 tablets (12.5-25 mg total) by mouth every 8 (eight) hours as needed for nausea or vomiting.   saccharomyces boulardii 250 MG capsule Commonly known as:  FLORASTOR Take 1 capsule (250 mg total) by mouth 2 (two) times daily for 15 days.        DISCHARGE INSTRUCTIONS:   Follow-up with primary care physician in 3 to 4 days Complete antibiotic course Home health physical therapy Outpatient follow-up with [facility] in 2 to 3 days Outpatient follow-up with [facility] endocrinology in 2  weeks DIET:  Cardiac diet and Diabetic diet  DISCHARGE CONDITION:  Stable  ACTIVITY:  Activity as tolerated  OXYGEN:  Home Oxygen: No.   Oxygen Delivery: room air  DISCHARGE LOCATION:  home   If you experience worsening of your admission symptoms, develop shortness of breath, life threatening emergency, suicidal or homicidal thoughts you must seek medical attention immediately by calling 911 or calling your MD immediately  if symptoms less severe.  You Must read complete instructions/literature along with all the possible adverse reactions/side effects for all the Medicines you take and that have been prescribed to you. Take any new Medicines after you have completely understood and accpet all the possible adverse reactions/side effects.   Please note  You were cared for by a hospitalist during your hospital stay. If you have any questions about your discharge medications or the care you received while you were in the hospital after you are discharged, you can call the unit and asked to speak with the hospitalist on call if the hospitalist that took care of  you is not available. Once you are discharged, your primary care physician will handle any further medical issues. Please note that NO REFILLS for any discharge medications will be authorized once you are discharged, as it is imperative that you return to your primary care physician (or establish a relationship with a primary care physician if you do not have one) for your aftercare needs so that they can reassess your need for medications and monitor your lab values.     Today  Chief Complaint  Patient presents with  . Weakness  . Fall    Patient is doing much better.  Does not want to wait for PT evaluation Feeling weak and agreeable with home PT  ROS:  CONSTITUTIONAL: Denies fevers, chills. Denies any fatigue, patient is reporting generalized weakness.  EYES: Denies blurry vision, double vision, eye pain. EARS, NOSE,  THROAT: Denies tinnitus, ear pain, hearing loss. RESPIRATORY: Denies cough, wheeze, shortness of breath.  CARDIOVASCULAR: Denies chest pain, palpitations, edema.  GASTROINTESTINAL: Denies nausea, vomiting, diarrhea, abdominal pain. Denies bright red blood per rectum. GENITOURINARY: Denies dysuria, hematuria. ENDOCRINE: Denies nocturia or thyroid problems. HEMATOLOGIC AND LYMPHATIC: Denies easy bruising or bleeding. SKIN: Denies rash or lesion. MUSCULOSKELETAL: Denies pain in neck, back, shoulder, knees, hips or arthritic symptoms.  NEUROLOGIC: Denies paralysis, paresthesias.  PSYCHIATRIC: Denies anxiety or depressive symptoms.   VITAL SIGNS:  Blood pressure (!) 134/92, pulse 89, temperature 97.6 F (36.4 C), temperature source Oral, resp. rate 18, height '5\' 10"'  (1.778 m), weight 118.5 kg, SpO2 94 %.  I/O:    Intake/Output Summary (Last 24 hours) at 12/16/2017 1215 Last data filed at 12/16/2017 1048 Gross per 24 hour  Intake 5329.73 ml  Output -  Net 5329.73 ml    PHYSICAL EXAMINATION:  GENERAL:  52 y.o.-year-old patient lying in the bed with no acute distress.  EYES: Pupils equal, round, reactive to light and accommodation. No scleral icterus. Extraocular muscles intact.  HEENT: Head atraumatic, normocephalic. Oropharynx and nasopharynx clear.  NECK:  Supple, no jugular venous distention. No thyroid enlargement, no tenderness.  LUNGS: Normal breath sounds bilaterally, no wheezing, rales,rhonchi or crepitation. No use of accessory muscles of respiration.  CARDIOVASCULAR: S1, S2 normal. No murmurs, rubs, or gallops.  ABDOMEN: Soft, non-tender, non-distended. Bowel sounds present. No organomegaly or mass.  EXTREMITIES: No pedal edema, cyanosis, or clubbing.  NEUROLOGIC: Cranial nerves II through XII are intact. Muscle strength 5/5 in all extremities. Sensation intact. Gait not checked.  PSYCHIATRIC: The patient is alert and oriented x 3.  SKIN: No obvious rash, lesion, or ulcer.    DATA REVIEW:   CBC Recent Labs  Lab 12/14/17 0513  WBC 7.5  HGB 11.3*  HCT 33.2*  PLT 201    Chemistries  Recent Labs  Lab 12/13/17 2200 12/14/17 0513  NA 134*  133* 139  K 4.6  4.6 4.1  CL 101  101 108  CO2 '24  24 23  ' GLUCOSE 211*  216* 191*  BUN '18  20 17  ' CREATININE 1.23*  1.24* 0.99  CALCIUM 8.6*  8.6* 8.0*  AST 25  --   ALT 27  --   ALKPHOS 84  --   BILITOT 0.6  --     Cardiac Enzymes Recent Labs  Lab 12/13/17 2200  TROPONINI <0.03    Microbiology Results  Results for orders placed or performed during the hospital encounter of 12/13/17  Blood Culture (routine x 2)     Status: None (Preliminary result)   Collection  Time: 12/13/17 10:57 PM  Result Value Ref Range Status   Specimen Description BLOOD LEFT HAND  Final   Special Requests   Final    BOTTLES DRAWN AEROBIC AND ANAEROBIC Blood Culture adequate volume   Culture   Final    NO GROWTH 3 DAYS Performed at [facility], [address], [place], [place] [address]    Report Status PENDING  Incomplete  Blood Culture (routine x 2)     Status: None (Preliminary result)   Collection Time: 12/13/17 10:57 PM  Result Value Ref Range Status   Specimen Description BLOOD LEFT ASSIST CONTROL  Final   Special Requests   Final    BOTTLES DRAWN AEROBIC AND ANAEROBIC Blood Culture adequate volume   Culture   Final    NO GROWTH 3 DAYS Performed at [facility], [address], [place], [place] [address]    Report Status PENDING  Incomplete  Urine culture     Status: Abnormal   Collection Time: 12/14/17 12:42 AM  Result Value Ref Range Status   Specimen Description   Final    URINE, RANDOM Performed at [facility], [address] [place], [place] [address]    Special Requests   Final    NONE Performed at [facility], [address], [place], [place] [address]    Culture MULTIPLE SPECIES PRESENT, SUGGEST RECOLLECTION (A)  Final   Report Status  12/15/2017 FINAL  Final    RADIOLOGY:  Dg Chest Port 1 View  Result Date: 12/13/2017 CLINICAL DATA:  Sepsis EXAM: PORTABLE CHEST 1 VIEW COMPARISON:  07/10/2009 FINDINGS: Airspace disease at the left base. No pleural effusion. Stable cardiomediastinal silhouette with mild central congestion. No pneumothorax. IMPRESSION: Airspace disease at the left lung base is suspicious for an infiltrate. Electronically Signed   By: Donavan Foil M.D.   On: 12/13/2017 22:43    EKG:   Orders placed or performed during the hospital encounter of 12/13/17  . ED EKG  . ED EKG      Management plans discussed with the patient, family and they are in agreement.  CODE STATUS:     Code Status Orders  (From admission, onward)         Start     Ordered   12/14/17 0343  Full code  Continuous     12/14/17 0342        Code Status History    Date Active Date Inactive Code Status Order ID Comments User Context   02/24/2016 2104 02/27/2016 1407 Full Code [id number]  Theodoro Grist, MD Inpatient      TOTAL TIME TAKING CARE OF THIS PATIENT: 42  minutes.   Note: This dictation was prepared with Dragon dictation along with smaller phrase technology. Any transcriptional errors that result from this process are unintentional.   '@[facility]' @  on 12/16/2017 at 12:15 PM  Between 7am to 6pm - Pager - [phone]  After 6pm go to [email or web address] - password [email or web address]  Office  [phone]  CC: Primary care physician; [name], DO

## 2017-12-18 ENCOUNTER — Telehealth: Payer: Self-pay

## 2017-12-18 LAB — CULTURE, BLOOD (ROUTINE X 2)
Culture: NO GROWTH
Culture: NO GROWTH
Special Requests: ADEQUATE
Special Requests: ADEQUATE

## 2017-12-18 NOTE — Telephone Encounter (Signed)
I have made the 2nd attempt to contact the patient or family member in charge, in order to follow up from recently being discharged from the hospital.  

## 2017-12-18 NOTE — Telephone Encounter (Signed)
I have made the 1st attempt to contact the patient or family member in charge, in order to follow up from recently being discharged from the hospital. I left a message on voicemail but I will make another attempt at a different time.   Direct Callback [phone]

## 2017-12-19 DIAGNOSIS — J181 Lobar pneumonia, unspecified organism: Secondary | ICD-10-CM | POA: Diagnosis not present

## 2017-12-19 DIAGNOSIS — Z87891 Personal history of nicotine dependence: Secondary | ICD-10-CM | POA: Diagnosis not present

## 2017-12-19 DIAGNOSIS — Z792 Long term (current) use of antibiotics: Secondary | ICD-10-CM | POA: Diagnosis not present

## 2017-12-19 DIAGNOSIS — Z794 Long term (current) use of insulin: Secondary | ICD-10-CM | POA: Diagnosis not present

## 2017-12-19 DIAGNOSIS — E1142 Type 2 diabetes mellitus with diabetic polyneuropathy: Secondary | ICD-10-CM | POA: Diagnosis not present

## 2017-12-19 DIAGNOSIS — E785 Hyperlipidemia, unspecified: Secondary | ICD-10-CM | POA: Diagnosis not present

## 2017-12-19 DIAGNOSIS — N12 Tubulo-interstitial nephritis, not specified as acute or chronic: Secondary | ICD-10-CM | POA: Diagnosis not present

## 2017-12-19 DIAGNOSIS — I10 Essential (primary) hypertension: Secondary | ICD-10-CM | POA: Diagnosis not present

## 2017-12-19 DIAGNOSIS — Z9181 History of falling: Secondary | ICD-10-CM | POA: Diagnosis not present

## 2017-12-20 ENCOUNTER — Encounter: Payer: Self-pay | Admitting: Family Medicine

## 2017-12-20 ENCOUNTER — Ambulatory Visit (INDEPENDENT_AMBULATORY_CARE_PROVIDER_SITE_OTHER): Payer: Medicare Other | Admitting: Family Medicine

## 2017-12-20 VITALS — BP 148/79 | HR 74 | Temp 98.3°F | Resp 16 | Ht 70.5 in | Wt 254.0 lb

## 2017-12-20 DIAGNOSIS — E1165 Type 2 diabetes mellitus with hyperglycemia: Secondary | ICD-10-CM

## 2017-12-20 DIAGNOSIS — E1149 Type 2 diabetes mellitus with other diabetic neurological complication: Secondary | ICD-10-CM

## 2017-12-20 DIAGNOSIS — N12 Tubulo-interstitial nephritis, not specified as acute or chronic: Secondary | ICD-10-CM

## 2017-12-20 DIAGNOSIS — IMO0002 Reserved for concepts with insufficient information to code with codable children: Secondary | ICD-10-CM

## 2017-12-20 NOTE — Progress Notes (Signed)
Subjective:    Patient ID: [name], female    DOB: [birth year], 52 y.o.   MRN: [id number]  [name] is a 52 y.o. female presenting on 12/20/2017 for Hospitalization Follow-up (Pyelonephritis )   [place]: [place] Date of Admission: 12/13/17 Date of Discharge: 12/16/17 Transitions of care telephone call: attempted x 2 by Tyler Aas LPN - 5/69/79  Reason for Admission: Pyelonephritis  - Hospital H&P and Discharge Summary have been reviewed - Patient presents today 7 days after recent hospitalization. Brief summary of recent course, patient had symptoms of pyelonephritis with fever chills dysuria among other symptoms for few days, seen by me in office on 814/19, see note, she was treated with Ceftriaxone 69m IM x 1 dose and oral Cipro, Zofran, and strict return precautions, had urine culture, ultimately she worsened that evening despite agreeing to trial of outpatient management, hospitalized, also question pneumonia CAP on CXR, treated with continued antibiotics CTX, Azithro, and oral antibiotic course with omnicef, initial Ur Cx showed Klebsiella UTI, repeat Urine culture was non contributory. - Today reports overall has done well after discharge. Symptoms of UTI and fever have resolved   - New medications on discharge: Omnicef - Changes to current meds on discharge: None  Home health was ordered and arranged  Additional updates [facility]    I have reviewed the discharge medication list, and have reconciled the current and discharge medications today.   Current Outpatient Medications:  .  acetaminophen (TYLENOL) 325 MG tablet, Take 2 tablets (650 mg total) by mouth every 6 (six) hours as needed for mild pain (or Fever >/= 101)., Disp: , Rfl:  .  ARIPiprazole (ABILIFY) 10 MG tablet, Take 10 mg by mouth daily., Disp: , Rfl:  .  atorvastatin (LIPITOR) 40 MG tablet, Take 1 tablet (40 mg  total) by mouth at bedtime., Disp: 90 tablet, Rfl: 3 .  B-D UF III MINI PEN NEEDLES 31G X 5 MM MISC, USE AS DIRECTED, Disp: 100 each, Rfl: 3 .  Blood Glucose Monitoring Suppl (ONE TOUCH ULTRA SYSTEM KIT) w/Device KIT, 1 kit by Does not apply route once., Disp: 1 each, Rfl: 0 .  cefdinir (OMNICEF) 300 MG capsule, Take 1 capsule (300 mg total) by mouth every 12 (twelve) hours for 7 days., Disp: 14 capsule, Rfl: 0 .  gabapentin (NEURONTIN) 100 MG capsule, Take 1 in AM and 2 in PM, Disp: 90 capsule, Rfl: 3 .  glucose blood test strip, Check blood sugar up to 3 times daily, Disp: 100 each, Rfl: 11 .  insulin aspart (NOVOLOG) 100 UNIT/ML FlexPen, Inject 5 Units into the skin 3 (three) times daily before meals. + sliding scale: If BG 150-200=add 2 units If BG 201-250=add 4 units If BG 251-300=add 6 units If BG 301-350=add 8 units If BG 351-400=add 10 units If BG >400=add 12 units, Disp: , Rfl:  .  lisinopril (PRINIVIL,ZESTRIL) 20 MG tablet, TAKE 1 TABLET(20 MG) BY MOUTH DAILY, Disp: 90 tablet, Rfl: 1 .  metFORMIN (GLUCOPHAGE) 1000 MG tablet, TAKE 1 TABLET(1000 MG) BY MOUTH TWICE DAILY WITH A MEAL, Disp: 180 tablet, Rfl: 1 .  PARoxetine (PAXIL) 20 MG tablet, Take 1 tablet (20 mg total) by mouth daily. Prescribed by [facility], Disp: 30 tablet, Rfl: 0 .  promethazine (PHENERGAN) 12.5 MG tablet, Take 1-2 tablets (12.5-25 mg total) by mouth every 8 (eight) hours as needed for nausea or vomiting., Disp: 30 tablet,  Rfl: 0 .  saccharomyces boulardii (FLORASTOR) 250 MG capsule, Take 1 capsule (250 mg total) by mouth 2 (two) times daily for 15 days., Disp: 30 capsule, Rfl: 0 .  Insulin Glargine (BASAGLAR KWIKPEN) 100 UNIT/ML SOPN, Inject 0.33 mLs (33 Units total) into the skin daily. Adjust dose as advised (Patient not taking: Reported on 12/20/2017), Disp: 45 mL, Rfl: 1  ------------------------------------------------------------------------- Social History   Tobacco Use  . Smoking status: Former Smoker     Packs/day: 3.00    Years: 30.00    Pack years: 90.00    Types: Cigarettes    Last attempt to quit: 05/02/2009    Years since quitting: 8.6  . Smokeless tobacco: Former Systems developer  . Tobacco comment: Pt reported  quitting in 2011  Substance Use Topics  . Alcohol use: No  . Drug use: No    Review of Systems Per HPI unless specifically indicated above     Objective:    BP (!) 148/79   Pulse 74   Temp 98.3 F (36.8 C) (Oral)   Resp 16   Ht 5' 10.5" (1.791 m)   Wt 254 lb (115.2 kg)   SpO2 100%   BMI 35.93 kg/m   Wt Readings from Last 3 Encounters:  12/20/17 254 lb (115.2 kg)  12/16/17 261 lb 3.9 oz (118.5 kg)  12/13/17 253 lb (114.8 kg)    Physical Exam  Constitutional: She is oriented to person, place, and time. She appears well-developed and well-nourished. No distress.  Well-appearing, comfortable, cooperative, obese  HENT:  Head: Normocephalic and atraumatic.  Mouth/Throat: Oropharynx is clear and moist.  Eyes: Conjunctivae are normal. Right eye exhibits no discharge. Left eye exhibits no discharge.  Neck: Normal range of motion. Neck supple. No thyromegaly present.  Cardiovascular: Normal rate, regular rhythm, normal heart sounds and intact distal pulses.  No murmur heard. Pulmonary/Chest: Effort normal and breath sounds normal. No respiratory distress. She has no wheezes. She has no rales.  Good air movement  Musculoskeletal: Normal range of motion. She exhibits no edema.  Lymphadenopathy:    She has no cervical adenopathy.  Neurological: She is alert and oriented to person, place, and time.  Skin: Skin is warm and dry. No rash noted. She is not diaphoretic. No erythema.  Psychiatric: She has a normal mood and affect. Her behavior is normal.  Well groomed, good eye contact, normal speech and thoughts  Nursing note and vitals reviewed.  Results for orders placed or performed during the hospital encounter of 12/13/17  Blood Culture (routine x 2)  Result Value Ref Range    Specimen Description BLOOD LEFT HAND    Special Requests      BOTTLES DRAWN AEROBIC AND ANAEROBIC Blood Culture adequate volume   Culture      NO GROWTH 5 DAYS Performed at [facility], [address], [place], [place] [address]    Report Status 12/18/2017 FINAL   Blood Culture (routine x 2)  Result Value Ref Range   Specimen Description BLOOD LEFT ASSIST CONTROL    Special Requests      BOTTLES DRAWN AEROBIC AND ANAEROBIC Blood Culture adequate volume   Culture      NO GROWTH 5 DAYS Performed at [facility], [address] [place], [place] [address]    Report Status 12/18/2017 FINAL   Urine culture  Result Value Ref Range   Specimen Description      URINE, RANDOM Performed at [facility], [address] [place],  [place] [address]    Special Requests      NONE Performed at [facility], [address] [place], [place] [address]    Culture MULTIPLE SPECIES PRESENT, SUGGEST RECOLLECTION (A)    Report Status 12/15/2017 FINAL   Basic metabolic panel  Result Value Ref Range   Sodium 133 (L) 135 - 145 mmol/L   Potassium 4.6 3.5 - 5.1 mmol/L   Chloride 101 98 - 111 mmol/L   CO2 24 22 - 32 mmol/L   Glucose, Bld 216 (H) 70 - 99 mg/dL   BUN 20 6 - 20 mg/dL   Creatinine, Ser 1.24 (H) 0.44 - 1.00 mg/dL   Calcium 8.6 (L) 8.9 - 10.3 mg/dL   GFR calc non Af Amer 49 (L) >60 mL/min   GFR calc Af Amer 56 (L) >60 mL/min   Anion gap 8 5 - 15  CBC  Result Value Ref Range   WBC 9.9 3.6 - 11.0 K/uL   RBC 4.23 3.80 - 5.20 MIL/uL   Hemoglobin 12.4 12.0 - 16.0 g/dL   HCT 36.9 35.0 - 47.0 %   MCV 87.3 80.0 - 100.0 fL   MCH 29.4 26.0 - 34.0 pg   MCHC 33.7 32.0 - 36.0 g/dL   RDW 16.6 (H) 11.5 - 14.5 %   Platelets 219 150 - 440 K/uL  Urinalysis, Complete w Microscopic  Result Value Ref Range   Color, Urine YELLOW (A) YELLOW   APPearance HAZY (A) CLEAR   Specific Gravity, Urine 1.015 1.005 - 1.030   pH 5.0 5.0 - 8.0   Glucose, UA 50 (A)  NEGATIVE mg/dL   Hgb urine dipstick NEGATIVE NEGATIVE   Bilirubin Urine NEGATIVE NEGATIVE   Ketones, ur 5 (A) NEGATIVE mg/dL   Protein, ur 30 (A) NEGATIVE mg/dL   Nitrite NEGATIVE NEGATIVE   Leukocytes, UA MODERATE (A) NEGATIVE   RBC / HPF 0-5 0 - 5 RBC/hpf   WBC, UA >50 (H) 0 - 5 WBC/hpf   Bacteria, UA NONE SEEN NONE SEEN   Squamous Epithelial / LPF 0-5 0 - 5   Mucus PRESENT   Lactic acid, plasma  Result Value Ref Range   Lactic Acid, Venous 1.1 0.5 - 1.9 mmol/L  Lactic acid, plasma  Result Value Ref Range   Lactic Acid, Venous 0.7 0.5 - 1.9 mmol/L  Troponin I  Result Value Ref Range   Troponin I <0.03 <0.03 ng/mL  Comprehensive metabolic panel  Result Value Ref Range   Sodium 134 (L) 135 - 145 mmol/L   Potassium 4.6 3.5 - 5.1 mmol/L   Chloride 101 98 - 111 mmol/L   CO2 24 22 - 32 mmol/L   Glucose, Bld 211 (H) 70 - 99 mg/dL   BUN 18 6 - 20 mg/dL   Creatinine, Ser 1.23 (H) 0.44 - 1.00 mg/dL   Calcium 8.6 (L) 8.9 - 10.3 mg/dL   Total Protein 7.1 6.5 - 8.1 g/dL   Albumin 3.5 3.5 - 5.0 g/dL   AST 25 15 - 41 U/L   ALT 27 0 - 44 U/L   Alkaline Phosphatase 84 38 - 126 U/L   Total Bilirubin 0.6 0.3 - 1.2 mg/dL   GFR calc non Af Amer 49 (L) >60 mL/min   GFR calc Af Amer 57 (L) >60 mL/min   Anion gap 9 5 - 15  HIV antibody (Routine Testing)  Result Value Ref Range   HIV Screen 4th Generation wRfx Non Reactive Non Reactive  Basic metabolic panel  Result Value Ref Range   Sodium 139 135 - 145 mmol/L   Potassium 4.1 3.5 - 5.1 mmol/L   Chloride 108 98 - 111 mmol/L   CO2 23 22 - 32 mmol/L   Glucose, Bld 191 (H) 70 - 99 mg/dL   BUN 17 6 - 20 mg/dL   Creatinine, Ser 0.99 0.44 - 1.00 mg/dL   Calcium 8.0 (L) 8.9 - 10.3 mg/dL   GFR calc non Af Amer >60 >60 mL/min   GFR calc Af Amer >60 >60 mL/min   Anion gap 8 5 - 15  CBC  Result Value Ref Range   WBC 7.5 3.6 - 11.0 K/uL   RBC 3.78 (L) 3.80 - 5.20 MIL/uL   Hemoglobin 11.3 (L) 12.0 - 16.0 g/dL   HCT 33.2 (L) 35.0 - 47.0 %    MCV 88.0 80.0 - 100.0 fL   MCH 29.9 26.0 - 34.0 pg   MCHC 33.9 32.0 - 36.0 g/dL   RDW 16.5 (H) 11.5 - 14.5 %   Platelets 201 150 - 440 K/uL  Glucose, capillary  Result Value Ref Range   Glucose-Capillary 178 (H) 70 - 99 mg/dL  Glucose, capillary  Result Value Ref Range   Glucose-Capillary 191 (H) 70 - 99 mg/dL  Glucose, capillary  Result Value Ref Range   Glucose-Capillary 182 (H) 70 - 99 mg/dL  Glucose, capillary  Result Value Ref Range   Glucose-Capillary 179 (H) 70 - 99 mg/dL  Glucose, capillary  Result Value Ref Range   Glucose-Capillary 188 (H) 70 - 99 mg/dL  Glucose, capillary  Result Value Ref Range   Glucose-Capillary 213 (H) 70 - 99 mg/dL  Glucose, capillary  Result Value Ref Range   Glucose-Capillary 233 (H) 70 - 99 mg/dL  Glucose, capillary  Result Value Ref Range   Glucose-Capillary 211 (H) 70 - 99 mg/dL  Glucose, capillary  Result Value Ref Range   Glucose-Capillary 213 (H) 70 - 99 mg/dL  Glucose, capillary  Result Value Ref Range   Glucose-Capillary 201 (H) 70 - 99 mg/dL  Glucose, capillary  Result Value Ref Range   Glucose-Capillary 194 (H) 70 - 99 mg/dL  ECHOCARDIOGRAM COMPLETE  Result Value Ref Range   Weight 4,096 oz   Height 70 in   BP 138/85 mmHg      Assessment & Plan:   Problem List Items Addressed This Visit    DM (diabetes mellitus), type 2, 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[place]) Continue f/u with [facility] Endocrinology, no changes to regimen today    Pyelonephritis - Primary - RESOLVED Clinically resolved, no recurrence or new concern Likely related to DM uncontrolled Completed Omnicef antibiotic, reviewed Klebsiella UTI on culture Advised return sooner if recurrent UTI and if persistent symptoms treat earlier      CAP - Resolved clinically  No orders of the defined types were placed in this encounter.   Follow up plan: Return for keep scheduled apts.  [name], [facility] 12/20/2017, 11:41 AM

## 2017-12-20 NOTE — Patient Instructions (Addendum)
Thank you for coming to the office today.  Finish current antibiotic Omnicef, no further antibiotics needed.  No blood work needed today, last checked on 8/15 with normal results.  Stay well hydrated as you are  Likely control Diabetes to reduce urinary tract infection.  Next apt with me we can do pap smear and physical  [facility] - recommend that they fax [place] a copy of your eye exam  Please schedule a Follow-up Appointment to: Return for keep scheduled apts.  If you have any other questions or concerns, please feel free to call the office or send a message through [place]. You may also schedule an earlier appointment if necessary.  Additionally, you may be receiving a survey about your experience at our office within a few days to 1 week by e-mail or mail. We value your feedback.  [name], DO [place]

## 2017-12-21 ENCOUNTER — Encounter: Payer: Self-pay | Admitting: Family Medicine

## 2017-12-21 ENCOUNTER — Telehealth: Payer: Self-pay | Admitting: Family Medicine

## 2017-12-21 DIAGNOSIS — E1165 Type 2 diabetes mellitus with hyperglycemia: Principal | ICD-10-CM

## 2017-12-21 DIAGNOSIS — E1142 Type 2 diabetes mellitus with diabetic polyneuropathy: Secondary | ICD-10-CM

## 2017-12-21 DIAGNOSIS — IMO0002 Reserved for concepts with insufficient information to code with codable children: Secondary | ICD-10-CM

## 2017-12-21 DIAGNOSIS — Z794 Long term (current) use of insulin: Principal | ICD-10-CM

## 2017-12-21 NOTE — Telephone Encounter (Signed)
The number is not correct ?

## 2017-12-21 NOTE — Telephone Encounter (Addendum)
Kim with [facility] needs a verbal for skilled nursing services for pt [phone]

## 2017-12-22 DIAGNOSIS — E1142 Type 2 diabetes mellitus with diabetic polyneuropathy: Secondary | ICD-10-CM | POA: Diagnosis not present

## 2017-12-22 DIAGNOSIS — I10 Essential (primary) hypertension: Secondary | ICD-10-CM | POA: Diagnosis not present

## 2017-12-22 DIAGNOSIS — J181 Lobar pneumonia, unspecified organism: Secondary | ICD-10-CM | POA: Diagnosis not present

## 2017-12-22 DIAGNOSIS — Z794 Long term (current) use of insulin: Secondary | ICD-10-CM | POA: Diagnosis not present

## 2017-12-22 DIAGNOSIS — Z792 Long term (current) use of antibiotics: Secondary | ICD-10-CM | POA: Diagnosis not present

## 2017-12-22 DIAGNOSIS — N12 Tubulo-interstitial nephritis, not specified as acute or chronic: Secondary | ICD-10-CM | POA: Diagnosis not present

## 2017-12-22 DIAGNOSIS — Z87891 Personal history of nicotine dependence: Secondary | ICD-10-CM | POA: Diagnosis not present

## 2017-12-22 DIAGNOSIS — Z9181 History of falling: Secondary | ICD-10-CM | POA: Diagnosis not present

## 2017-12-22 DIAGNOSIS — E785 Hyperlipidemia, unspecified: Secondary | ICD-10-CM | POA: Diagnosis not present

## 2017-12-25 NOTE — Telephone Encounter (Signed)
Verbal given 

## 2017-12-26 ENCOUNTER — Other Ambulatory Visit: Payer: Self-pay | Admitting: Family Medicine

## 2017-12-26 DIAGNOSIS — E1165 Type 2 diabetes mellitus with hyperglycemia: Principal | ICD-10-CM

## 2017-12-26 DIAGNOSIS — E1142 Type 2 diabetes mellitus with diabetic polyneuropathy: Secondary | ICD-10-CM

## 2017-12-26 DIAGNOSIS — Z794 Long term (current) use of insulin: Principal | ICD-10-CM

## 2017-12-26 DIAGNOSIS — IMO0002 Reserved for concepts with insufficient information to code with codable children: Secondary | ICD-10-CM

## 2017-12-26 MED ORDER — BASAGLAR KWIKPEN 100 UNIT/ML ~~LOC~~ SOPN: 50.0000 [IU] | PEN_INJECTOR | Freq: Every day | SUBCUTANEOUS | 1 refills | Status: DC

## 2017-12-26 NOTE — Telephone Encounter (Signed)
Pt.called requesting refill on Basaglar called into [facility].

## 2017-12-27 MED ORDER — INSULIN GLARGINE 100 UNIT/ML SOLOSTAR PEN: 50.0000 [IU] | PEN_INJECTOR | Freq: Every day | SUBCUTANEOUS | 1 refills | Status: DC

## 2017-12-28 ENCOUNTER — Encounter: Payer: Self-pay | Admitting: Dietician

## 2017-12-28 ENCOUNTER — Encounter: Payer: Medicare Other | Admitting: Dietician

## 2017-12-28 VITALS — BP 90/60 | Ht 70.0 in | Wt 245.8 lb

## 2017-12-28 DIAGNOSIS — E1169 Type 2 diabetes mellitus with other specified complication: Secondary | ICD-10-CM | POA: Diagnosis not present

## 2017-12-28 DIAGNOSIS — E1140 Type 2 diabetes mellitus with diabetic neuropathy, unspecified: Secondary | ICD-10-CM

## 2017-12-28 DIAGNOSIS — E1149 Type 2 diabetes mellitus with other diabetic neurological complication: Secondary | ICD-10-CM | POA: Diagnosis not present

## 2017-12-28 DIAGNOSIS — E1159 Type 2 diabetes mellitus with other circulatory complications: Secondary | ICD-10-CM | POA: Diagnosis not present

## 2017-12-28 DIAGNOSIS — Z794 Long term (current) use of insulin: Principal | ICD-10-CM

## 2017-12-28 DIAGNOSIS — Z713 Dietary counseling and surveillance: Secondary | ICD-10-CM | POA: Diagnosis not present

## 2017-12-28 DIAGNOSIS — E1165 Type 2 diabetes mellitus with hyperglycemia: Secondary | ICD-10-CM | POA: Diagnosis not present

## 2017-12-28 DIAGNOSIS — E119 Type 2 diabetes mellitus without complications: Secondary | ICD-10-CM | POA: Diagnosis not present

## 2017-12-28 NOTE — Patient Instructions (Signed)
   If you eat soup for a meal, keep to 1/2 sandwich to go with it. Add extra protein and/or vegetables into the soup or on the sandwich to get full.   Keep up the healthy food choices, and keep carb foods to 3 servings or 45grams with each meal.   Can increase protein food or "free" vegetables to help with fulness.   Keep up regular exercise, great job!

## 2017-12-28 NOTE — Progress Notes (Signed)
Diabetes Self-Management Education  Visit Type:  Follow-up  Appt. Start Time: 1330 Appt. End Time: 1430  12/28/2017  [name], identified by name and date of birth, is a 52 y.o. female with a diagnosis of Diabetes:  .   ASSESSMENT  Blood pressure 90/60, height 5\' 10"  (1.778 m), weight 245 lb 12.8 oz (111.5 kg). Body mass index is 35.27 kg/m.   Diabetes Self-Management Education - 67/20/94 7096      Complications   How often do you check your blood sugar?  1-2 times/day    Fasting Blood glucose range (mg/dL)  130-179;180-200   pre-meal readings 123-239   Have you had a dilated eye exam in the past 12 months?  Yes    Have you had a dental exam in the past 12 months?  No   working to find a Pharmacist, community in [place]   Are you checking your feet?  Yes    How many days per week are you checking your feet?  3      Dietary Intake   Breakfast  eggs with cheese or bacon + toast or potatoes, sometimes fruit    Snack (morning)  none    Lunch  meat or cheese or yogurt with veg and/or fruit, some starch; tuna salad with fruit or veg.     Dinner  meat and veg + some starch      Exercise   Exercise Type  Light (walking / raking leaves)    How many days per week to you exercise?  4    How many minutes per day do you exercise?  20    Total minutes per week of exercise  80      Patient Education   Nutrition management   Food label reading, portion sizes and measuring food.;Meal timing in regards to the patients' current diabetes medication.;Meal options for control of blood glucose level and chronic complications.;Other (comment)   basic meal planning for weight loss   Physical activity and exercise   Role of exercise on diabetes management, blood pressure control and cardiac health.;Helped patient identify appropriate exercises in relation to his/her diabetes, diabetes complications and other health issue.;Other (comment)   timing of exercise in relation to food intake   Monitoring   Taught/discussed recording of test results and interpretation of SMBG.    Acute complications  Taught treatment of hypoglycemia - the 15 rule.      Post-Education Assessment   Patient understands the diabetes disease and treatment process.  Demonstrates understanding / competency    Patient understands incorporating nutritional management into lifestyle.  Demonstrates understanding / competency    Patient undertands incorporating physical activity into lifestyle.  Demonstrates understanding / competency    Patient understands using medications safely.  Demonstrates understanding / competency    Patient understands monitoring blood glucose, interpreting and using results  Demonstrates understanding / competency    Patient understands prevention, detection, and treatment of acute complications.  Demonstrates understanding / competency    Patient understands prevention, detection, and treatment of chronic complications.  Demonstrates understanding / competency    Patient understands how to develop strategies to address psychosocial issues.  Needs Review    Patient understands how to develop strategies to promote health/change behavior.  Demonstrates understanding / competency      Outcomes   Program Status  Completed       Learning Objective:  Patient will have a greater understanding of diabetes self-management. Patient education plan is to attend individual  and/or group sessions per assessed needs and concerns.  Patient has lost about 11lbs since RN visit on 12/11/17. She has significantly reduced portion sizes of foods (eating 1 plate of food instead of 2-3 plates she was eating several months ago), and she has now stopped eating snacks. She is gradually resuming exercise since improving after recent hospitalization for UTI and pneumonia.   Patient reports feeling sleepy today; BP 90/60 during visit today. Discussed possibility of lower BP and blood sugars with her significant diet changes and  weight loss.   Plan:   Patient Instructions   If you eat soup for a meal, keep to 1/2 sandwich to go with it. Add extra protein and/or vegetables into the soup or on the sandwich to get full.   Keep up the healthy food choices, and keep carb foods to 3 servings or 45grams with each meal.   Can increase protein food or "free" vegetables to help with fulness.   Keep up regular exercise, great job!    Expected Outcomes:  Demonstrated interest in learning. Expect positive outcomes  Education material provided: Carb Counting and Meal Planning [employer]); Quick and Healthy Meal Ideas; Smart Snacking  If problems or questions, patient to contact team via:  Phone and Email

## 2017-12-29 DIAGNOSIS — E1142 Type 2 diabetes mellitus with diabetic polyneuropathy: Secondary | ICD-10-CM | POA: Diagnosis not present

## 2017-12-29 DIAGNOSIS — Z794 Long term (current) use of insulin: Secondary | ICD-10-CM | POA: Diagnosis not present

## 2017-12-29 DIAGNOSIS — Z9181 History of falling: Secondary | ICD-10-CM | POA: Diagnosis not present

## 2017-12-29 DIAGNOSIS — N12 Tubulo-interstitial nephritis, not specified as acute or chronic: Secondary | ICD-10-CM | POA: Diagnosis not present

## 2017-12-29 DIAGNOSIS — E785 Hyperlipidemia, unspecified: Secondary | ICD-10-CM | POA: Diagnosis not present

## 2017-12-29 DIAGNOSIS — Z87891 Personal history of nicotine dependence: Secondary | ICD-10-CM | POA: Diagnosis not present

## 2017-12-29 DIAGNOSIS — I10 Essential (primary) hypertension: Secondary | ICD-10-CM | POA: Diagnosis not present

## 2017-12-29 DIAGNOSIS — J181 Lobar pneumonia, unspecified organism: Secondary | ICD-10-CM | POA: Diagnosis not present

## 2017-12-29 DIAGNOSIS — Z792 Long term (current) use of antibiotics: Secondary | ICD-10-CM | POA: Diagnosis not present

## 2018-01-03 ENCOUNTER — Other Ambulatory Visit (HOSPITAL_COMMUNITY)
Admission: RE | Admit: 2018-01-03 | Discharge: 2018-01-03 | Disposition: A | Discharge status: Home / Self Care | Payer: Medicare Other | Source: Ambulatory Visit | Attending: Family Medicine | Admitting: Family Medicine

## 2018-01-03 ENCOUNTER — Ambulatory Visit (INDEPENDENT_AMBULATORY_CARE_PROVIDER_SITE_OTHER): Payer: Medicare Other | Admitting: Family Medicine

## 2018-01-03 ENCOUNTER — Encounter: Payer: Self-pay | Admitting: Family Medicine

## 2018-01-03 VITALS — BP 119/70 | HR 90 | Temp 97.8°F | Resp 16 | Ht 70.5 in | Wt 247.0 lb

## 2018-01-03 DIAGNOSIS — Z124 Encounter for screening for malignant neoplasm of cervix: Secondary | ICD-10-CM

## 2018-01-03 DIAGNOSIS — E1165 Type 2 diabetes mellitus with hyperglycemia: Secondary | ICD-10-CM

## 2018-01-03 DIAGNOSIS — IMO0002 Reserved for concepts with insufficient information to code with codable children: Secondary | ICD-10-CM

## 2018-01-03 DIAGNOSIS — E1169 Type 2 diabetes mellitus with other specified complication: Secondary | ICD-10-CM | POA: Diagnosis not present

## 2018-01-03 DIAGNOSIS — I10 Essential (primary) hypertension: Secondary | ICD-10-CM

## 2018-01-03 DIAGNOSIS — E1149 Type 2 diabetes mellitus with other diabetic neurological complication: Secondary | ICD-10-CM | POA: Diagnosis not present

## 2018-01-03 DIAGNOSIS — E785 Hyperlipidemia, unspecified: Secondary | ICD-10-CM

## 2018-01-03 DIAGNOSIS — Z23 Encounter for immunization: Secondary | ICD-10-CM

## 2018-01-03 MED ORDER — ATORVASTATIN CALCIUM 40 MG PO TABS: 40.0000 mg | ORAL_TABLET | Freq: Every day | ORAL | 3 refills | Status: DC

## 2018-01-03 MED ORDER — ONETOUCH VERIO VI STRP
ORAL_STRIP | 11 refills | Status: DC
Start: 2018-01-03 — End: 2018-10-29

## 2018-01-03 NOTE — Assessment & Plan Note (Signed)
Uncontrolled cholesterol on statin, poor lifestyle Last lipid panel 10/2017 Calculated ASCVD 10 yr risk score elevated due to uncontrolled T2DM  Plan: 1. Continue current meds - Atorvastatin 40mg  2. Encourage improved lifestyle - low carb/cholesterol, reduce portion size, continue improving regular exercise 3. Advised to continue current dose for now - in future when A1c and lifestyle back on track we can consider dose adjust or med switch if still need optimal LDL lowering

## 2018-01-03 NOTE — Assessment & Plan Note (Signed)
Controlled HTN No complication   Plan:  1. Continue current BP regimen - Lisinopril 20mg  daily 2. Encourage improved lifestyle - low sodium diet, regular exercise (start improving) 3. Start monitor BP outside office, bring readings to next visit, if persistently >140/90 or new symptoms notify office sooner 4. Follow-up 4 months

## 2018-01-03 NOTE — Assessment & Plan Note (Signed)
Followed by [name] Endocrinology for DM now Prior A1c uncontrolled >14 with severe hyperglycemia Now improving gradually, remains on basal insulin lantus 50u, Novolog sliding scale, metformin 1000mg  BID - Advised her to continue with Endocrinology for primarily managing her DM at this time, and we will follow along - defer insulin dosing questions to endocrine

## 2018-01-03 NOTE — Progress Notes (Addendum)
Subjective:    Patient ID: [name], female    DOB: [birth year], 52 y.o.   MRN: [id number]  [name] is a 52 y.o. female presenting on 01/03/2018 for Diabetes  Patient provides majority of history. She is accompanied by her mother today.  HPI  Uncontrolled Type 2 Diabetes, with Hyperglycemia / DM Neuropathy Followed by [facility] Endocrinology due to non adherence to therapy in past and A1c >14, last visit 12/28/17, was continued on basal insulin lantus now 50u daily, and Metformin 1045m BID< and Novolog sliding scale meal coverage. She has had improved sugars by report. - Today she is asking about insulin dosing and does not have CBG readings but is aware that she is to discuss sugars further with her Endocrinology, next apt was scheduled for about 3 months and has lab/nutrition visit as well. - [facility]  HYPERLIPIDEMIA - Reports concern. Last lipid panel 10/2017, elevated LDL and TG - Currently taking Atorvastatin 448mdaily, tolerating well without side effects or myalgias  Obesity BMI >34 History of abnormal weight gain. Recently past 6 weeks has had some weight loss with improved lifestyle.  CHRONIC HTN: Reports no new concerns. Current Meds - Lisinopril 2058maily   Reports good compliance, took meds today. Tolerating well, w/o complaints.   Health Maintenance:  Cervical CA Screening - no prior reported abnormal pap. Last done 10-15 years ago. Due today for testing.  Due for mammogram, [facility] but not scheduled yet. She is asymptomatic.  Due for Flu Shot, will receive today    [facility] 01/03/2018 12/13/2017 12/12/2017  Decreased Interest 0 0 0  Down, Depressed, Hopeless 0 0 0  PHQ - 2 Score 0 0 0  Altered sleeping 0 - -  Tired, decreased energy 0 - -  Change in appetite 0 - -  Feeling bad or failure about yourself  0 - -    Trouble concentrating 0 - -  Moving slowly or fidgety/restless 0 - -  Suicidal thoughts 0 - -  PHQ-9 Score 0 - -  Difficult doing work/chores Not difficult at all - -    Past Medical History:  Diagnosis Date  . Depression   . Hyperlipidemia   . Hypertension   . Neuromuscular disorder (HCCDonnelly . Schizo affective schizophrenia (HCCNolan  abilify and pazil   Past Surgical History:  Procedure Laterality Date  . CHOLECYSTECTOMY    . IRRIGATION AND DEBRIDEMENT FOOT Right 02/26/2016   Procedure: RIGHT 2ND TOE DEBRIDEMENT;  Surgeon: JusSamara DeistPM;  Location: [facility] ORS;  Service: Podiatry;  Laterality: Right;   Social History   Socioeconomic History  . Marital status: Divorced    Spouse name: Not on file  . Number of children: 2  . Years of education: Not on file  . Highest education level: Not on file  Occupational History  . Not on file  Social Needs  . Financial resource strain: Not hard at all  . Food insecurity:    Worry: Never true    Inability: Never true  . Transportation needs:    Medical: No    Non-medical: No  Tobacco Use  . Smoking status: Former Smoker    Packs/day: 3.00    Years: 30.00    Pack years: 90.00    Types: Cigarettes    Last attempt to quit: 05/02/2009  Years since quitting: 8.6  . Smokeless tobacco: Former Systems developer  . Tobacco comment: Pt reported  quitting in 2011  Substance and Sexual Activity  . Alcohol use: No  . Drug use: No  . Sexual activity: Not Currently  Lifestyle  . Physical activity:    Days per week: 0 days    Minutes per session: 0 min  . Stress: Not at all  Relationships  . Social connections:    Talks on phone: More than three times a week    Gets together: More than three times a week    Attends religious service: More than 4 times per year    Active member of club or organization: No    Attends meetings of clubs or organizations: Never    Relationship status: Divorced  . Intimate partner violence:    Fear of current  or ex partner: No    Emotionally abused: No    Physically abused: No    Forced sexual activity: No  Other Topics Concern  . Not on file  Social History Narrative  . Not on file   Family History  Problem Relation Age of Onset  . Breast cancer Cousin        maternal cousin  . Hypertension Mother   . Hyperthyroidism Mother   . Diabetes Maternal Grandmother   . Hypertension Maternal Grandmother   . Cancer Maternal Grandmother        unknown type  . Diabetes Maternal Grandfather   . Hypertension Maternal Grandfather   . Diabetes Paternal Grandmother   . Hypertension Paternal Grandmother   . Diabetes Paternal Grandfather   . Hypertension Paternal Grandfather    Current Outpatient Medications on File Prior to Visit  Medication Sig  . acetaminophen (TYLENOL) 325 MG tablet Take 2 tablets (650 mg total) by mouth every 6 (six) hours as needed for mild pain (or Fever >/= 101).  . ARIPiprazole (ABILIFY) 10 MG tablet Take 10 mg by mouth daily.  . B-D UF III MINI PEN NEEDLES 31G X 5 MM MISC USE AS DIRECTED  . Blood Glucose Monitoring Suppl (ONE TOUCH ULTRA SYSTEM KIT) w/Device KIT 1 kit by Does not apply route once.  . gabapentin (NEURONTIN) 100 MG capsule Take 1 in AM and 2 in PM  . insulin aspart (NOVOLOG) 100 UNIT/ML FlexPen Inject 5 Units into the skin 3 (three) times daily before meals. + sliding scale: If BG 150-200=add 2 units If BG 201-250=add 4 units If BG 251-300=add 6 units If BG 301-350=add 8 units If BG 351-400=add 10 units If BG >400=add 12 units  . Insulin Glargine (LANTUS SOLOSTAR) 100 UNIT/ML Solostar Pen Inject 50 Units into the skin daily. Adjust dose as advised  . lisinopril (PRINIVIL,ZESTRIL) 20 MG tablet TAKE 1 TABLET(20 MG) BY MOUTH DAILY  . metFORMIN (GLUCOPHAGE) 1000 MG tablet TAKE 1 TABLET(1000 MG) BY MOUTH TWICE DAILY WITH A MEAL  . PARoxetine (PAXIL) 20 MG tablet Take 1 tablet (20 mg total) by mouth daily. Prescribed by [facility] Psychiat[facility]   No current  facility-administered medications on file prior to visit.     Review of Systems  Constitutional: Negative for activity change, appetite change, chills, diaphoresis, fatigue and fever.  HENT: Negative for congestion and hearing loss.   Eyes: Negative for visual disturbance.  Respiratory: Negative for apnea, cough, chest tightness, shortness of breath and wheezing.   Cardiovascular: Negative for chest pain, palpitations and leg swelling.  Gastrointestinal: Negative for abdominal pain, anal bleeding, blood in stool, constipation,  diarrhea, nausea and vomiting.  Endocrine: Negative for cold intolerance.  Genitourinary: Negative for difficulty urinating, dysuria, frequency and hematuria.  Musculoskeletal: Negative for arthralgias, back pain and neck pain.  Skin: Negative for rash.  Allergic/Immunologic: Negative for environmental allergies.  Neurological: Negative for dizziness, weakness, light-headedness, numbness and headaches.  Hematological: Negative for adenopathy.  Psychiatric/Behavioral: Negative for behavioral problems, dysphoric mood and sleep disturbance. The patient is not nervous/anxious.    Per HPI unless specifically indicated above     Objective:    BP 119/70   Pulse 90   Temp 97.8 F (36.6 C) (Oral)   Resp 16   Ht 5' 10.5" (1.791 m)   Wt 247 lb (112 kg)   BMI 34.94 kg/m   Wt Readings from Last 3 Encounters:  01/03/18 247 lb (112 kg)  12/28/17 245 lb 12.8 oz (111.5 kg)  12/20/17 254 lb (115.2 kg)    Physical Exam  Constitutional: She is oriented to person, place, and time. She appears well-developed and well-nourished. No distress.  Well-appearing, comfortable, cooperative, obese  HENT:  Head: Normocephalic and atraumatic.  Mouth/Throat: Oropharynx is clear and moist.  Frontal / maxillary sinuses non-tender. Nares patent without purulence or edema. Bilateral TMs clear without erythema, effusion or bulging. Oropharynx clear without erythema, exudates, edema or  asymmetry.  Eyes: Conjunctivae are normal. Right eye exhibits no discharge. Left eye exhibits no discharge.  Neck: Normal range of motion. Neck supple. No thyromegaly present.  Cardiovascular: Normal rate, regular rhythm, normal heart sounds and intact distal pulses.  No murmur heard. Pulmonary/Chest: Effort normal and breath sounds normal. No respiratory distress. She has no wheezes. She has no rales.  Abdominal: Soft. Bowel sounds are normal. She exhibits no distension. There is no tenderness.  Genitourinary:  Genitourinary Comments: Normal external female genitalia. Vaginal canal without lesions. Normal appearing cervix, without lesions or bleeding. Physiologic discharge on exam. Bimanual exam without masses or cervical motion tenderness.  Pap smear specimen collected.  Pelvic Exam chaperoned by Frederich Cha, CMA  Musculoskeletal: Normal range of motion. She exhibits no edema.  Lymphadenopathy:    She has no cervical adenopathy.  Neurological: She is alert and oriented to person, place, and time.  Skin: Skin is warm and dry. No rash noted. She is not diaphoretic. No erythema.  Psychiatric: She has a normal mood and affect. Her behavior is normal.  Well groomed, good eye contact, normal speech and thoughts  Nursing note and vitals reviewed.    Recent Labs    01/18/17 1122 05/10/17 1138 11/07/17 0925  HGBA1C 8.2 10.0* >14.0*    Results for orders placed or performed during the hospital encounter of 12/13/17  Blood Culture (routine x 2)  Result Value Ref Range   Specimen Description BLOOD LEFT HAND    Special Requests      BOTTLES DRAWN AEROBIC AND ANAEROBIC Blood Culture adequate volume   Culture      NO GROWTH 5 DAYS Performed at [facility], [address], [place], [place] [address]    Report Status 12/18/2017 FINAL   Blood Culture (routine x 2)  Result Value Ref Range   Specimen Description BLOOD LEFT ASSIST CONTROL    Special Requests      BOTTLES  DRAWN AEROBIC AND ANAEROBIC Blood Culture adequate volume   Culture      NO GROWTH 5 DAYS Performed at [facility], [address] [place], [place] [address]    Report Status 12/18/2017 FINAL   Urine culture  Result Value Ref Range   Specimen Description      URINE, RANDOM Performed at [facility], [address] [place], [place] [address]    Special Requests      NONE Performed at [facility], [address] [place],  [address]    Culture MULTIPLE SPECIES PRESENT, SUGGEST RECOLLECTION (A)    Report Status 12/15/2017 FINAL   Basic metabolic panel  Result Value Ref Range   Sodium 133 (L) 135 - 145 mmol/L   Potassium 4.6 3.5 - 5.1 mmol/L   Chloride 101 98 - 111 mmol/L   CO2 24 22 - 32 mmol/L   Glucose, Bld 216 (H) 70 - 99 mg/dL   BUN 20 6 - 20 mg/dL   Creatinine, Ser 1.24 (H) 0.44 - 1.00 mg/dL   Calcium 8.6 (L) 8.9 - 10.3 mg/dL   GFR calc non Af Amer 49 (L) >60 mL/min   GFR calc Af Amer 56 (L) >60 mL/min   Anion gap 8 5 - 15  CBC  Result Value Ref Range   WBC 9.9 3.6 - 11.0 K/uL   RBC 4.23 3.80 - 5.20 MIL/uL   Hemoglobin 12.4 12.0 - 16.0 g/dL   HCT 36.9 35.0 - 47.0 %   MCV 87.3 80.0 - 100.0 fL   MCH 29.4 26.0 - 34.0 pg   MCHC 33.7 32.0 - 36.0 g/dL   RDW 16.6 (H) 11.5 - 14.5 %   Platelets 219 150 - 440 K/uL  Urinalysis, Complete w Microscopic  Result Value Ref Range   Color, Urine YELLOW (A) YELLOW   APPearance HAZY (A) CLEAR   Specific Gravity, Urine 1.015 1.005 - 1.030   pH 5.0 5.0 - 8.0   Glucose, UA 50 (A) NEGATIVE mg/dL   Hgb urine dipstick NEGATIVE NEGATIVE   Bilirubin Urine NEGATIVE NEGATIVE   Ketones, ur 5 (A) NEGATIVE mg/dL   Protein, ur 30 (A) NEGATIVE mg/dL   Nitrite NEGATIVE NEGATIVE   Leukocytes, UA MODERATE (A) NEGATIVE   RBC / HPF 0-5 0 - 5 RBC/hpf   WBC, UA >50 (H) 0 - 5 WBC/hpf   Bacteria, UA NONE SEEN NONE SEEN   Squamous Epithelial / LPF 0-5 0 - 5   Mucus PRESENT   Lactic acid, plasma  Result Value  Ref Range   Lactic Acid, Venous 1.1 0.5 - 1.9 mmol/L  Lactic acid, plasma  Result Value Ref Range   Lactic Acid, Venous 0.7 0.5 - 1.9 mmol/L  Troponin I  Result Value Ref Range   Troponin I <0.03 <0.03 ng/mL  Comprehensive metabolic panel  Result Value Ref Range   Sodium 134 (L) 135 - 145 mmol/L   Potassium 4.6 3.5 - 5.1 mmol/L   Chloride 101 98 - 111 mmol/L   CO2 24 22 - 32 mmol/L   Glucose, Bld 211 (H) 70 - 99 mg/dL   BUN 18 6 - 20 mg/dL   Creatinine, Ser 1.23 (H) 0.44 - 1.00 mg/dL   Calcium 8.6 (L) 8.9 - 10.3 mg/dL   Total Protein 7.1 6.5 - 8.1 g/dL   Albumin 3.5 3.5 - 5.0 g/dL   AST 25 15 - 41 U/L   ALT 27 0 - 44 U/L   Alkaline Phosphatase 84 38 - 126 U/L   Total Bilirubin 0.6 0.3 - 1.2 mg/dL   GFR calc non Af Amer 49 (L) >60 mL/min   GFR calc Af Amer 57 (L) >60 mL/min   Anion gap 9 5 - 15  HIV antibody (Routine Testing)  Result Value Ref Range   HIV Screen 4th Generation wRfx Non Reactive Non Reactive  Basic metabolic panel  Result Value Ref Range   Sodium 139 135 - 145 mmol/L   Potassium 4.1 3.5 - 5.1 mmol/L   Chloride 108 98 - 111 mmol/L   CO2 23 22 - 32 mmol/L   Glucose, Bld 191 (H) 70 - 99 mg/dL   BUN 17 6 - 20 mg/dL   Creatinine, Ser 0.99 0.44 - 1.00 mg/dL   Calcium 8.0 (L) 8.9 - 10.3 mg/dL   GFR calc non Af Amer >60 >60 mL/min   GFR calc Af Amer >60 >60 mL/min   Anion gap 8 5 - 15  CBC  Result Value Ref Range   WBC 7.5 3.6 - 11.0 K/uL   RBC 3.78 (L) 3.80 - 5.20 MIL/uL   Hemoglobin 11.3 (L) 12.0 - 16.0 g/dL   HCT 33.2 (L) 35.0 - 47.0 %   MCV 88.0 80.0 - 100.0 fL   MCH 29.9 26.0 - 34.0 pg   MCHC 33.9 32.0 - 36.0 g/dL   RDW 16.5 (H) 11.5 - 14.5 %   Platelets 201 150 - 440 K/uL  Glucose, capillary  Result Value Ref Range   Glucose-Capillary 178 (H) 70 - 99 mg/dL  Glucose, capillary  Result Value Ref Range   Glucose-Capillary 191 (H) 70 - 99 mg/dL  Glucose, capillary  Result Value Ref Range   Glucose-Capillary 182 (H) 70 - 99 mg/dL  Glucose,  capillary  Result Value Ref Range   Glucose-Capillary 179 (H) 70 - 99 mg/dL  Glucose, capillary  Result Value Ref Range   Glucose-Capillary 188 (H) 70 - 99 mg/dL  Glucose, capillary  Result Value Ref Range   Glucose-Capillary 213 (H) 70 - 99 mg/dL  Glucose, capillary  Result Value Ref Range   Glucose-Capillary 233 (H) 70 - 99 mg/dL  Glucose, capillary  Result Value Ref Range   Glucose-Capillary 211 (H) 70 - 99 mg/dL  Glucose, capillary  Result Value Ref Range   Glucose-Capillary 213 (H) 70 - 99 mg/dL  Glucose, capillary  Result Value Ref Range   Glucose-Capillary 201 (H) 70 - 99 mg/dL  Glucose, capillary  Result Value Ref Range   Glucose-Capillary 194 (H) 70 - 99 mg/dL  ECHOCARDIOGRAM COMPLETE  Result Value Ref Range   Weight 4,096 oz   Height 70 in   BP 138/85 mmHg      Assessment & Plan:   Problem List Items Addressed This Visit    DM (diabetes mellitus), type 2, 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[place]) - Primary    Followed by [facility] for DM now Prior A1c uncontrolled >14 with severe hyperglycemia Now improving gradually, remains on basal insulin lantus 50u, Novolog sliding scale, metformin 1066m BID - Advised her to continue with Endocrinology for primarily managing her DM at this time, and we will follow along - defer insulin dosing questions to endocrine      Relevant Medications   atorvastatin (LIPITOR) 40 MG tablet   ONETOUCH VERIO test strip   Essential hypertension    Controlled HTN No complication   Plan:  1. Continue current BP regimen - Lisinopril 216mdaily 2. Encourage improved lifestyle - low sodium diet, regular exercise (start improving) 3. Start monitor BP outside office, bring readings to next visit, if persistently >140/90 or new symptoms notify office sooner 4. Follow-up 4 months      Relevant Medications   atorvastatin (LIPITOR) 40 MG  tablet   Hyperlipidemia associated with type 2 diabetes mellitus ([place])     Uncontrolled cholesterol on statin, poor lifestyle Last lipid panel 10/2017 Calculated ASCVD 10 yr risk score elevated due to uncontrolled T2DM  Plan: 1. Continue current meds - Atorvastatin 20m 2. Encourage improved lifestyle - low carb/cholesterol, reduce portion size, continue improving regular exercise 3. Advised to continue current dose for now - in future when A1c and lifestyle back on track we can consider dose adjust or med switch if still need optimal LDL lowering      Relevant Medications   atorvastatin (LIPITOR) 40 MG tablet    Other Visit Diagnoses    Needs flu shot       Relevant Orders   Flu Vaccine QUAD 36+ mos IM (Completed)   Cervical cancer screening       Relevant Orders   Cytology - PAP      Additionally regarding referral for other services with nursing or someone to check on her monthly for status updates and health reminders, I will check with our C3 nurse available in here office and see what she would recommend [facility] and what services are available. They will contact patient once arranged. Of note, patient did decline a home health nurse at this time due to change in medical status with Diabetes management.  **UPDATE 01/04/18**  Received response from C3 coordinator, KJoanette Gula  Requested referral to Amb C3  BLangley Adiereferred me to JRegina Eck PharmD, BCPS, [place] JAlmyra Freesaid that she will reach out to Ms. Denslow today or tomorrow to discuss their program to help her with managing her Rx. She will update and route any encounters to you so that you are made aware.   Meds ordered this encounter  Medications  . atorvastatin (LIPITOR) 40 MG tablet    Sig: Take 1 tablet (40 mg total) by mouth at bedtime.    Dispense:  90 tablet    Refill:  3  . ONETOUCH VERIO test strip    Sig: Check blood sugar up to 3 x daily as needed    Dispense:  100 each    Refill:  11    For use with One  Touch Verio Meter   Orders Placed This Encounter  Procedures  . Flu Vaccine QUAD 36+ mos IM  . Ambulatory referral to Connected [facility]    Referral Priority:   Routine    Referral Type:   Consultation    Referral Reason:   Specialty Services Required    Number of Visits Requested:   1    Follow up plan: Return in about 4 months (around 05/05/2018) for DM follow-up after Endocrine.  Additionally I discussed with patient that I was aware of some of the complaints brought to our office's attention by her sister as she was not satisfied with our care recently involving hospitalization, see prior telephone note. Patient today states that she is happy with care and wants to proceed, but that her sister was recently more involved in her care. I advised her that it is fine if her sister is more involved in her care, and encouraged and if she would like to accompany her to an appointment that is fine, otherwise in future if she continues to have disagreements with our care then we may need to find an alternative solution in future, perhaps patient may seek care elsewhere if she and her family are not satisfied.  ANobie Putnam  DO [facility] 01/03/2018, 6:14 PM

## 2018-01-03 NOTE — Patient Instructions (Addendum)
Thank you for coming to the office today.  Flu shot today  For Mammogram screening for breast cancer   Call the [facility] below anytime to schedule your own appointment now that order has been placed.  [facility] [address], [place] [address] Phone: [phone]  Stay tuned for more information from [place] - to see what services you will qualify for.  Stay tuned for pap smear results.  Please schedule a Follow-up Appointment to: Return in about 4 months (around 05/05/2018) for DM follow-up after Endocrine.  If you have any other questions or concerns, please feel free to call the office or send a message through [place]. You may also schedule an earlier appointment if necessary.  Additionally, you may be receiving a survey about your experience at our office within a few days to 1 week by e-mail or mail. We value your feedback.  [name], DO [place]

## 2018-01-04 ENCOUNTER — Telehealth: Payer: Self-pay | Admitting: Family Medicine

## 2018-01-04 NOTE — Addendum Note (Signed)
Addended by: [name] on: 01/04/2018 01:01 PM   Modules accepted: Orders

## 2018-01-04 NOTE — Telephone Encounter (Signed)
[name] from [facility] states patient gain 4 pounds since yesterday and her weight fluctuate between 241-246 has som cold and chest tightness but not SOB or  Dizziness or swelling. Her call back number is [phone].

## 2018-01-04 NOTE — Telephone Encounter (Signed)
Attempted to call [name] back. Did not reach her. Left a voicemail.  I am not as concerned with 4 lb weight gain in 24 hours, because she does not have a diagnosis of Congestive Heart Failure, had a recent ECHOcardiogram tested in August 2019, and heart function was normal. This may be some fluid weight or other fluctuation in weight, but I do not have a specific treatment recommendation for this at this time.  Regarding her symptoms, she did have recent episode of pneumonia treated about 2-3 weeks ago in hospital. Her lungs were clear in office yesterday 01/03/18. However I did advise her if worsening symptoms with breathing or cough or new concerns fever, that she may need to return in near future for a Chest X-ray and repeat evaluation. If she has concerns with this she may try to schedule more urgently with [place] or may be seen at Urgent Care or Hospital if worsening over weekend or after hours.  [name], [facility] 01/04/2018, 12:50 PM

## 2018-01-05 LAB — CYTOLOGY - PAP
Diagnosis: NEGATIVE
HPV: NOT DETECTED

## 2018-01-08 ENCOUNTER — Ambulatory Visit: Payer: Self-pay | Admitting: Pharmacist

## 2018-01-10 ENCOUNTER — Ambulatory Visit: Payer: Self-pay | Admitting: Pharmacist

## 2018-01-12 ENCOUNTER — Emergency Department
Admission: EM | Admit: 2018-01-12 | Discharge: 2018-01-12 | Disposition: A | Discharge status: Home / Self Care | Payer: Medicare Other | Attending: Emergency Medicine | Admitting: Emergency Medicine

## 2018-01-12 ENCOUNTER — Emergency Department: Payer: Medicare Other

## 2018-01-12 ENCOUNTER — Encounter: Payer: Self-pay | Admitting: *Deleted

## 2018-01-12 ENCOUNTER — Other Ambulatory Visit: Payer: Self-pay

## 2018-01-12 ENCOUNTER — Ambulatory Visit (INDEPENDENT_AMBULATORY_CARE_PROVIDER_SITE_OTHER): Payer: Medicare Other | Admitting: Family Medicine

## 2018-01-12 ENCOUNTER — Encounter: Payer: Self-pay | Admitting: Family Medicine

## 2018-01-12 VITALS — BP 144/83 | HR 97 | Temp 98.4°F | Ht 70.5 in | Wt 250.8 lb

## 2018-01-12 DIAGNOSIS — M7989 Other specified soft tissue disorders: Secondary | ICD-10-CM | POA: Diagnosis not present

## 2018-01-12 DIAGNOSIS — Z9049 Acquired absence of other specified parts of digestive tract: Secondary | ICD-10-CM | POA: Diagnosis not present

## 2018-01-12 DIAGNOSIS — L03115 Cellulitis of right lower limb: Secondary | ICD-10-CM | POA: Diagnosis not present

## 2018-01-12 DIAGNOSIS — Z87891 Personal history of nicotine dependence: Secondary | ICD-10-CM | POA: Diagnosis not present

## 2018-01-12 DIAGNOSIS — F259 Schizoaffective disorder, unspecified: Secondary | ICD-10-CM | POA: Diagnosis not present

## 2018-01-12 DIAGNOSIS — Z79899 Other long term (current) drug therapy: Secondary | ICD-10-CM | POA: Insufficient documentation

## 2018-01-12 DIAGNOSIS — E11621 Type 2 diabetes mellitus with foot ulcer: Secondary | ICD-10-CM | POA: Diagnosis not present

## 2018-01-12 DIAGNOSIS — M79604 Pain in right leg: Secondary | ICD-10-CM | POA: Diagnosis not present

## 2018-01-12 DIAGNOSIS — I10 Essential (primary) hypertension: Secondary | ICD-10-CM | POA: Insufficient documentation

## 2018-01-12 DIAGNOSIS — L97511 Non-pressure chronic ulcer of other part of right foot limited to breakdown of skin: Secondary | ICD-10-CM | POA: Diagnosis not present

## 2018-01-12 DIAGNOSIS — F329 Major depressive disorder, single episode, unspecified: Secondary | ICD-10-CM | POA: Diagnosis not present

## 2018-01-12 DIAGNOSIS — R2241 Localized swelling, mass and lump, right lower limb: Secondary | ICD-10-CM | POA: Diagnosis present

## 2018-01-12 DIAGNOSIS — L97519 Non-pressure chronic ulcer of other part of right foot with unspecified severity: Secondary | ICD-10-CM | POA: Diagnosis not present

## 2018-01-12 DIAGNOSIS — Z794 Long term (current) use of insulin: Secondary | ICD-10-CM | POA: Insufficient documentation

## 2018-01-12 DIAGNOSIS — R600 Localized edema: Secondary | ICD-10-CM | POA: Diagnosis not present

## 2018-01-12 LAB — CBC WITH DIFFERENTIAL/PLATELET
Basophils Absolute: 0.1 10*3/uL (ref 0–0.1)
Basophils Relative: 1 %
Eosinophils Absolute: 0.2 10*3/uL (ref 0–0.7)
Eosinophils Relative: 2 %
HCT: 33.6 % — ABNORMAL LOW (ref 35.0–47.0)
Hemoglobin: 11.3 g/dL — ABNORMAL LOW (ref 12.0–16.0)
Lymphocytes Relative: 23 %
Lymphs Abs: 2.5 10*3/uL (ref 1.0–3.6)
MCH: 28.7 pg (ref 26.0–34.0)
MCHC: 33.7 g/dL (ref 32.0–36.0)
MCV: 85.2 fL (ref 80.0–100.0)
Monocytes Absolute: 0.5 10*3/uL (ref 0.2–0.9)
Monocytes Relative: 4 %
Neutro Abs: 7.8 10*3/uL — ABNORMAL HIGH (ref 1.4–6.5)
Neutrophils Relative %: 70 %
Platelets: 248 10*3/uL (ref 150–440)
RBC: 3.95 MIL/uL (ref 3.80–5.20)
RDW: 16.2 % — ABNORMAL HIGH (ref 11.5–14.5)
WBC: 11.1 10*3/uL — ABNORMAL HIGH (ref 3.6–11.0)

## 2018-01-12 LAB — COMPREHENSIVE METABOLIC PANEL
ALT: 29 U/L (ref 0–44)
AST: 22 U/L (ref 15–41)
Albumin: 4 g/dL (ref 3.5–5.0)
Alkaline Phosphatase: 105 U/L (ref 38–126)
Anion gap: 8 (ref 5–15)
BUN: 14 mg/dL (ref 6–20)
CO2: 26 mmol/L (ref 22–32)
Calcium: 9.7 mg/dL (ref 8.9–10.3)
Chloride: 99 mmol/L (ref 98–111)
Creatinine, Ser: 0.79 mg/dL (ref 0.44–1.00)
GFR calc Af Amer: >60 mL/min (ref >60)
GFR calc non Af Amer: >60 mL/min (ref >60)
Glucose, Bld: 182 mg/dL — ABNORMAL HIGH (ref 70–99)
Potassium: 4.5 mmol/L (ref 3.5–5.1)
Sodium: 133 mmol/L — ABNORMAL LOW (ref 135–145)
Total Bilirubin: 0.2 mg/dL — ABNORMAL LOW (ref 0.3–1.2)
Total Protein: 7.6 g/dL (ref 6.5–8.1)

## 2018-01-12 LAB — GLUCOSE, CAPILLARY: Glucose-Capillary: 164 mg/dL — ABNORMAL HIGH (ref 70–99)

## 2018-01-12 LAB — LACTIC ACID, PLASMA: Lactic Acid, Venous: 1.2 mmol/L (ref 0.5–1.9)

## 2018-01-12 MED ORDER — CEPHALEXIN 500 MG PO CAPS: 500.0000 mg | ORAL_CAPSULE | Freq: Four times a day (QID) | ORAL | 0 refills | Status: DC

## 2018-01-12 NOTE — ED Triage Notes (Signed)
First Nurse Note:  Arrives to ED for evaluation of swollen RLE, possible cellulitis.

## 2018-01-12 NOTE — ED Triage Notes (Signed)
PT to ED from PCP with redness and swelling in the lower right leg x 3 weeks. PT able to ambulate independently and without increased pain. Pt is a diabetic that reports her glucose has been in the 200s for the past week.  Pedal pulses intact and strong.

## 2018-01-12 NOTE — Discharge Instructions (Signed)
As we discussed, you may have a mild infection in your right lower leg.  We provided antibiotics and encourage you to take the full 10-day course as written.  We also strongly encourage you to follow-up with Dr. Vickki Muff as you plan to do in 3 days.  Return to the emergency department if you develop new or worsening symptoms that concern you.

## 2018-01-12 NOTE — Progress Notes (Signed)
Subjective:    Patient ID: [name], female    DOB: 1965-06-22, 52 y.o.   MRN: [id number]  [name] is a 52 y.o. female presenting on 01/12/2018 for Foot Swelling (swelling, redness in the Right ankle and foot. x 1 week ); Weight Gain (irregular weight gain x 7-8 pound gain); and Encopresis (at bedime x 4 times )  Patient presents for a same day appointment. Patient is accompanied by her mother.  HPI  Reports Right lower extremity redness and swelling - Reports new concern 1-2 weeks with Right lower leg redness and swelling, also some weight gain overall, she has neuropathy in lower legs and feet cannot tell if painful or not, but has some difficulty walking on R leg. No known immobilization or other provoking factor. -  Taking Gabapentin, thinks may cause some weight gain - No prior DVT or PE - Denies dyspnea, cough hemoptysis, chest pain, other swelling,. Injury trauma  Right Toe Ulcerations / Uncontrolled Diabetes Followed by [facility] due to failure of prior therapy through our office for diabetes management. Also has seen Dr Vickki Muff [facility]iat[facility] for DM neuropathy. - Now has ulcerations on bottom of toes on R foot, still superficial tissue loss without drainage or deeper subq exposure. Again cannot feel pain due to neuropathy - History of DM ulceration infected before - Recent course on antibiotics for Pyelonephritis. Denies drainage of pus, fever chills   Depression screen [facility] 2/9 01/03/2018 12/13/2017 12/12/2017  Decreased Interest 0 0 0  Down, Depressed, Hopeless 0 0 0  PHQ - 2 Score 0 0 0  Altered sleeping 0 - -  Tired, decreased energy 0 - -  Change in appetite 0 - -  Feeling bad or failure about yourself  0 - -  Trouble concentrating 0 - -  Moving slowly or fidgety/restless 0 - -  Suicidal thoughts 0 - -  PHQ-9 Score 0 - -  Difficult doing work/chores Not difficult at all - -    Social History   Tobacco Use  . Smoking status: Former  Smoker    Packs/day: 3.00    Years: 30.00    Pack years: 90.00    Types: Cigarettes    Last attempt to quit: 05/02/2009    Years since quitting: 8.7  . Smokeless tobacco: Former Systems developer  . Tobacco comment: Pt reported  quitting in 2011  Substance Use Topics  . Alcohol use: No  . Drug use: No    Review of Systems Per HPI unless specifically indicated above     Objective:    BP (!) 144/83 (BP Location: Right Arm, Patient Position: Sitting, Cuff Size: Normal)   Pulse 97   Temp 98.4 F (36.9 C) (Oral)   Ht 5' 10.5" (1.791 m)   Wt 250 lb 12.8 oz (113.8 kg)   BMI 35.48 kg/m   Wt Readings from Last 3 Encounters:  01/12/18 250 lb 12.8 oz (113.8 kg)  01/03/18 247 lb (112 kg)  12/28/17 245 lb 12.8 oz (111.5 kg)    Physical Exam  Constitutional: She is oriented to person, place, and time. She appears well-developed and well-nourished. No distress.  Well-appearing, comfortable, cooperative, obese  HENT:  Head: Normocephalic and atraumatic.  Mouth/Throat: Oropharynx is clear and moist.  Cardiovascular: Regular rhythm, normal heart sounds and intact distal pulses.  No murmur heard. Mild tachycardic  Pulmonary/Chest: Effort normal and breath sounds normal. No respiratory distress. She has no wheezes. She has no rales.  Musculoskeletal: Normal  range of motion. She exhibits edema (R lower leg and ankle).  Neurological: She is alert and oriented to person, place, and time.  Skin: Skin is warm and dry. No rash noted. She is not diaphoretic. There is erythema (Right lower leg mostly anteriorly, with some warmth).  Right lower leg inc size circumference 30cm compared to L same area 26 cm  R foot superficial ulceration and thickening of skin. Concern early stage DM ulcer  Psychiatric: Her behavior is normal.  Well groomed, good eye contact, normal speech and thoughts  Nursing note and vitals reviewed.           Results for orders placed or performed in visit on 01/03/18  Cytology -  PAP  Result Value Ref Range   Adequacy      Satisfactory for evaluation  endocervical/transformation zone component PRESENT.   Diagnosis      NEGATIVE FOR INTRAEPITHELIAL LESIONS OR MALIGNANCY.   HPV NOT DETECTED    Material Submitted CervicoVaginal Pap [ThinPrep Imaged]    CYTOLOGY - PAP PAP RESULT       Assessment & Plan:   Problem List Items Addressed This Visit    None    Visit Diagnoses    Pain and swelling of right lower extremity    -  Primary   Diabetic ulcer of other part of right foot associated with type 2 diabetes mellitus, limited to breakdown of skin (HCC)       Relevant Medications   Insulin Glargine (BASAGLAR KWIKPEN [place])      Concern for possible DVT vs cellulitis, cannot rule out Possible secondary infection from superficial DM ulcerations on R toes, may be source. Exam and history suggestive, given rapid worsening of this problem Well's Score 2-3 (depending on pain, but neuropathy present) - High risk for DVT  Plan Discussion on my suspicions for either DVT or cellulitis, needs advanced imaging, such as doppler US and labs to work-up properly, may need blood culture as well if no evidence of clot  Defer antibiotics and outpatient management today. Will send directly to [facility] ED for further prompt evaluation and imaging, labs today.  Advised her to follow their recommendations, and if not improved in future, may ultimately need vascular specialist for edema and possible arterial issue with possible ulcerations.  Also follow-up with [facility] Podiat[facility] Dr Vickki Muff as planned, may need to be seen sooner for ulcerations.  Explained return criteria and risks of progression of problem if does not seek immediate care.  I called [facility] ED triage and notified them of patient arrival.  No orders of the defined types were placed in this encounter.   Follow up plan: Return if symptoms worsen or fail to improve.  [name], [facility] 01/12/2018, 2:11 PM

## 2018-01-12 NOTE — ED Provider Notes (Signed)
[facility] Emergency Department Provider Note  ____________________________________________   First MD Initiated Contact with Patient 01/12/18 1600     (approximate)  I have reviewed the triage vital signs and the nursing notes.   HISTORY  Chief Complaint Cellulitis    HPI [name] is a 52 y.o. female with medical history as listed below who presents for evaluation of some redness and swelling in her right lower leg.  Has been gradually worsening over the last 3 weeks but the redness is gotten acutely worse within the last couple of days.  Nothing in particular makes it better or worse.  It is not painful but she reports that she does have peripheral neuropathy secondary to her diabetes.  She went to her doctor today who also noticed that she has ulcers on the bottom of the second and third toe of the right leg.  They are concerned it may represent cellulitis and recommended she come to the emergency department for further evaluation.  She denies fever/chills, chest pain, shortness of breath, nausea, vomiting, and abdominal pain.  She has no difficulty ambulating and no difficulty with weightbearing.  The area on her right lower leg (on the shin) is not tender.  Past Medical History:  Diagnosis Date  . Depression   . Hyperlipidemia   . Hypertension   . Neuromuscular disorder ([place])   . Schizo affective schizophrenia ([place])    abilify and pazil    Patient Active Problem List   Diagnosis Date Noted  . Pyelonephritis 12/14/2017  . Morbid obesity ([place]) 05/10/2017  . Long-term use of high-risk medication 05/10/2017  . Hyperlipidemia associated with type 2 diabetes mellitus ([place]) 06/27/2016  . Hammer toe of second toe of right foot 04/20/2016  . Essential hypertension 06/29/2015  . Schizoaffective disorder ([place]) 06/29/2015  . DM (diabetes mellitus), type 2, 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[place]) 50/12/3816  . Polyneuropathy 04/02/2014     Past Surgical History:  Procedure Laterality Date  . CHOLECYSTECTOMY    . IRRIGATION AND DEBRIDEMENT FOOT Right 02/26/2016   Procedure: RIGHT 2ND TOE DEBRIDEMENT;  Surgeon: Samara Deist, DPM;  Location: [facility] ORS;  Service: Podiatry;  Laterality: Right;    Prior to Admission medications   Medication Sig Start Date End Date Taking? Authorizing Provider  ARIPiprazole (ABILIFY) 10 MG tablet Take 10 mg by mouth daily.   Yes [provider]  atorvastatin (LIPITOR) 40 MG tablet Take 1 tablet (40 mg total) by mouth at bedtime. 01/03/18  Yes Karamalegos, Devonne Doughty, DO  insulin aspart (NOVOLOG) 100 UNIT/ML FlexPen Inject 5 Units into the skin 3 (three) times daily before meals. + sliding scale: If BG 150-200=add 2 units If BG 201-250=add 4 units If BG 251-300=add 6 units If BG 301-350=add 8 units If BG 351-400=add 10 units If BG >400=add 12 units 11/27/17 11/27/18 Yes [provider]  lisinopril (PRINIVIL,ZESTRIL) 20 MG tablet TAKE 1 TABLET(20 MG) BY MOUTH DAILY 11/13/17  Yes Karamalegos, [name] J, DO  metFORMIN (GLUCOPHAGE) 1000 MG tablet TAKE 1 TABLET(1000 MG) BY MOUTH TWICE DAILY WITH A MEAL 11/13/17  Yes Karamalegos, Devonne Doughty, DO  PARoxetine (PAXIL) 20 MG tablet Take 1 tablet (20 mg total) by mouth daily. Prescribed by [facility]iat[facility] 10/11/16  Yes Parks Ranger, Devonne Doughty, DO  acetaminophen (TYLENOL) 325 MG tablet Take 2 tablets (650 mg total) by mouth every 6 (six) hours as needed for mild pain (or Fever >/= 101). Patient not taking: Reported on 01/12/2018 12/16/17   Nicholes Mango, MD  B-D UF III MINI PEN NEEDLES 31G X 5 MM MISC USE AS DIRECTED 09/11/17   Karamalegos, Devonne Doughty, DO  Blood Glucose Monitoring Suppl (ONE TOUCH ULTRA SYSTEM KIT) w/Device KIT 1 kit by Does not apply route once. 08/20/15   Arlis Porta., MD  cephALEXin (KEFLEX) 500 MG capsule Take 1 capsule (500 mg total) by mouth 4 (four) times daily. 01/12/18   Hinda Kehr, MD  gabapentin (NEURONTIN)  100 MG capsule Take 1 in AM and 2 in PM 05/10/17   Karamalegos, Devonne Doughty, DO  Insulin Glargine [facility]) Inject into the skin.    [provider]  Insulin Glargine (LANTUS SOLOSTAR) 100 UNIT/ML Solostar Pen Inject 50 Units into the skin daily. Adjust dose as advised Patient not taking: Reported on 01/12/2018 12/27/17   [name], DO  [facility] VERIO test strip Check blood sugar up to 3 x daily as needed 01/03/18   [name], DO    Allergies Patient has no known allergies.  Family History  Problem Relation Age of Onset  . Breast cancer Cousin        maternal cousin  . Hypertension Mother   . Hyperthyroidism Mother   . Diabetes Maternal Grandmother   . Hypertension Maternal Grandmother   . Cancer Maternal Grandmother        unknown type  . Diabetes Maternal Grandfather   . Hypertension Maternal Grandfather   . Diabetes Paternal Grandmother   . Hypertension Paternal Grandmother   . Diabetes Paternal Grandfather   . Hypertension Paternal Grandfather     Social History Social History   Tobacco Use  . Smoking status: Former Smoker    Packs/day: 3.00    Years: 30.00    Pack years: 90.00    Types: Cigarettes    Last attempt to quit: 05/02/2009    Years since quitting: 8.7  . Smokeless tobacco: Former Systems developer  . Tobacco comment: Pt reported  quitting in 2011  Substance Use Topics  . Alcohol use: No  . Drug use: No    Review of Systems Constitutional: No fever/chills Eyes: No visual changes. ENT: No sore throat. Cardiovascular: Denies chest pain. Respiratory: Denies shortness of breath. Gastrointestinal: No abdominal pain.  No nausea, no vomiting.  No diarrhea.  No constipation. Genitourinary: Negative for dysuria. Musculoskeletal: Negative for neck pain.  Negative for back pain. Integumentary: Redness on the anterior right lower leg and ulcers on the bottom of the second and third toe of the right foot. Neurological: Negative  for headaches, focal weakness or numbness.   ____________________________________________   PHYSICAL EXAM:  VITAL SIGNS: ED Triage Vitals  Enc Vitals Group     BP 01/12/18 1421 (!) 185/97     Pulse Rate 01/12/18 1421 (!) 105     Resp 01/12/18 1421 16     Temp 01/12/18 1421 98.1 F (36.7 C)     Temp Source 01/12/18 1421 Oral     SpO2 01/12/18 1421 99 %     Weight 01/12/18 1422 113.8 kg (250 lb 12.8 oz)     Height 01/12/18 1422 1.791 m (5' 10.5")     Head Circumference --      Peak Flow --      Pain Score 01/12/18 1422 0     Pain Loc --      Pain Edu? --      Excl. in [place]? --     Constitutional: Alert and oriented. Well appearing and in no acute  distress. Eyes: Conjunctivae are normal.  Head: Atraumatic. Nose: No congestion/rhinnorhea. Mouth/Throat: Mucous membranes are moist. Neck: No stridor.  No meningeal signs.   Cardiovascular: Normal rate, regular rhythm. Good peripheral circulation. Grossly normal heart sounds. Respiratory: Normal respiratory effort.  No retractions. Lungs CTAB. Gastrointestinal: Soft and nontender. No distention.  Musculoskeletal: No lower extremity tenderness nor edema. No gross deformities of extremities.  Ambulatory with steady gait and no apparent difficulty. Neurologic:  Normal speech and language. No gross focal neurologic deficits are appreciated.  Skin:  Skin is warm and dry.  The patient has an area of about 5 cm in diameter of erythema on the right anterior lower leg consistent with either peripheral vascular disease or cellulitis.  Is not red or streaking proximally.  Additionally the patient has some relatively superficial and chronic appearing pressure ulcers on the bottom of her second and third toe on the right foot.  No purulence.  No pitting edema in either lower extremity.  No tenderness  To palpation of the right popliteal region. Psychiatric: Mood and affect are normal. Speech and behavior are  normal.  ____________________________________________   LABS (all labs ordered are listed, but only abnormal results are displayed)  Labs Reviewed  COMPREHENSIVE METABOLIC PANEL - Abnormal; Notable for the following components:      Result Value   Sodium 133 (*)    Glucose, Bld 182 (*)    Total Bilirubin 0.2 (*)    All other components within normal limits  CBC WITH DIFFERENTIAL/PLATELET - Abnormal; Notable for the following components:   WBC 11.1 (*)    Hemoglobin 11.3 (*)    HCT 33.6 (*)    RDW 16.2 (*)    Neutro Abs 7.8 (*)    All other components within normal limits  GLUCOSE, CAPILLARY - Abnormal; Notable for the following components:   Glucose-Capillary 164 (*)    All other components within normal limits  LACTIC ACID, PLASMA  CBG MONITORING, ED   ____________________________________________  EKG  None - EKG not ordered by ED physician ____________________________________________  RADIOLOGY   ED MD interpretation:  No DVT  Official radiology report(s): US Venous Img Lower Unilateral Right  Result Date: 01/12/2018 CLINICAL DATA:  Right lower extremity edema. Diabetic ulcers affecting the toes. Evaluate for DVT. EXAM: RIGHT LOWER EXTREMITY VENOUS DOPPLER ULTRASOUND TECHNIQUE: Gray-scale sonography with graded compression, as well as color Doppler and duplex ultrasound were performed to evaluate the lower extremity deep venous systems from the level of the common femoral vein and including the common femoral, femoral, profunda femoral, popliteal and calf veins including the posterior tibial, peroneal and gastrocnemius veins when visible. The superficial great saphenous vein was also interrogated. Spectral Doppler was utilized to evaluate flow at rest and with distal augmentation maneuvers in the common femoral, femoral and popliteal veins. COMPARISON:  Right lower extremity venous Doppler ultrasound-09/03/2015 FINDINGS: Contralateral Common Femoral Vein: Respiratory  phasicity is normal and symmetric with the symptomatic side. No evidence of thrombus. Normal compressibility. Common Femoral Vein: No evidence of thrombus. Normal compressibility, respiratory phasicity and response to augmentation. Saphenofemoral Junction: No evidence of thrombus. Normal compressibility and flow on color Doppler imaging. Profunda Femoral Vein: No evidence of thrombus. Normal compressibility and flow on color Doppler imaging. Femoral Vein: No evidence of thrombus. Normal compressibility, respiratory phasicity and response to augmentation. Popliteal Vein: No evidence of thrombus. Normal compressibility, respiratory phasicity and response to augmentation. Calf Veins: No evidence of thrombus. Normal compressibility and flow on color Doppler imaging. Superficial Great  Saphenous Vein: No evidence of thrombus. Normal compressibility. Venous Reflux:  None. Other Findings:  None. IMPRESSION: No evidence of DVT within the right lower extremity. Electronically Signed   By: Sandi Mariscal M.D.   On: 01/12/2018 17:47    ____________________________________________   PROCEDURES  Critical Care performed: No   Procedure(s) performed:   Procedures   ____________________________________________   INITIAL IMPRESSION / ASSESSMENT AND PLAN / ED COURSE  As part of my medical decision making, I reviewed the following data within the [place] notes reviewed and incorporated, Labs reviewed , Old chart reviewed and Notes from prior ED visits    Differential diagnosis includes, but is not limited to, cellulitis, peripheral vascular disease, DVT.  The patient is low risk for DVT based on Wells score but I think it is reasonable to rule out given her comorbidities.  She has no sign of spreading cellulitis at this time and her lab work is all reassuring with only very mild leukocytosis.  Vital signs are stable and the one episode of tachycardia I think was secondary to her anxiety  and moving around.  She was calm when I was speaking with her but she is anxious in general and pacing around waiting for her ultrasound.  I reassured her that we would treat empirically with Keflex.  She Artie has plans to see Dr. Vickki Muff with podiatry on Monday which I think is very appropriate.  I plan to treat with Keflex 500 mg 4 times daily x10 days as empiric treatment for cellulitis and less she has evidence of DVT on ultrasound.  Clinical Course as of Jan 12 1801  Fri Jan 12, 2018  1757 No evidence of DVT.  The patient says she will stop by the pharmacy on the way home to pick up the Keflex prescription.  I gave my usual and customary return precautions.  US Venous Img Lower Unilateral Right [CF]    Clinical Course User Index [CF] Hinda Kehr, MD    ____________________________________________  FINAL CLINICAL IMPRESSION(S) / ED DIAGNOSES  Final diagnoses:  Cellulitis of right anterior lower leg  Diabetic ulcer of toe of right foot associated with type 2 diabetes mellitus, unspecified ulcer stage ([place])     MEDICATIONS GIVEN DURING THIS VISIT:  Medications - No data to display   ED Discharge Orders         Ordered    cephALEXin (KEFLEX) 500 MG capsule  4 times daily,   Status:  Discontinued     01/12/18 1800    cephALEXin (KEFLEX) 500 MG capsule  4 times daily     01/12/18 1801           Note:  This document was prepared using Dragon voice recognition software and may include unintentional dictation errors.    Hinda Kehr, MD 01/12/18 [phone]

## 2018-01-12 NOTE — Patient Instructions (Addendum)
Thank you for coming to the office today.  Please go straight to the [facility] ED hospital to be evaluated for possible blood clot in lower extremity. Will need imaging and blood testing to rule out blood clot, or if negative then need treatment for likely cellulitis skin infection.  Need to evaluate toes and may need imaging of foot as well.  After hospital or ED - can return to Dr Vickki Muff [facility]) for repeat foot exam for the ulceration on bottom of foot.  May need vascular doctor in future for leg.  Please schedule a Follow-up Appointment to: Return if symptoms worsen or fail to improve.  If you have any other questions or concerns, please feel free to call the office or send a message through [place]. You may also schedule an earlier appointment if necessary.  Additionally, you may be receiving a survey about your experience at our office within a few days to 1 week by e-mail or mail. We value your feedback.  [name], DO [place]

## 2018-01-16 DIAGNOSIS — L97511 Non-pressure chronic ulcer of other part of right foot limited to breakdown of skin: Secondary | ICD-10-CM | POA: Diagnosis not present

## 2018-01-16 DIAGNOSIS — M2041 Other hammer toe(s) (acquired), right foot: Secondary | ICD-10-CM | POA: Diagnosis not present

## 2018-01-16 DIAGNOSIS — E1142 Type 2 diabetes mellitus with diabetic polyneuropathy: Secondary | ICD-10-CM | POA: Diagnosis not present

## 2018-01-17 ENCOUNTER — Ambulatory Visit: Payer: Self-pay | Admitting: Pharmacist

## 2018-01-17 ENCOUNTER — Other Ambulatory Visit: Payer: Self-pay | Admitting: Pharmacist

## 2018-01-17 NOTE — Patient Outreach (Signed)
[facility] [facility]) Care Management  01/17/2018  [name] 03-Oct-1965 [id number]  52 year old female referred to [place] [facility] for diabetes management.  PMH significant for: DMT2, HTN. HLD and recently seen in the ED for SSTI 2/2 diabetes (sent by her PCP).  Unsuccessful outreach attempt #1 to [name].  HIPAA compliant message left encouraging call back.  PLAN: -I will f/u with patient within 4 business days -Unsuccessful letter sent   Regina Eck, PharmD, [place]  [phone]

## 2018-01-19 DIAGNOSIS — E1142 Type 2 diabetes mellitus with diabetic polyneuropathy: Secondary | ICD-10-CM | POA: Diagnosis not present

## 2018-01-19 DIAGNOSIS — Z87891 Personal history of nicotine dependence: Secondary | ICD-10-CM | POA: Diagnosis not present

## 2018-01-19 DIAGNOSIS — Z9181 History of falling: Secondary | ICD-10-CM | POA: Diagnosis not present

## 2018-01-19 DIAGNOSIS — Z792 Long term (current) use of antibiotics: Secondary | ICD-10-CM | POA: Diagnosis not present

## 2018-01-19 DIAGNOSIS — N12 Tubulo-interstitial nephritis, not specified as acute or chronic: Secondary | ICD-10-CM | POA: Diagnosis not present

## 2018-01-19 DIAGNOSIS — E785 Hyperlipidemia, unspecified: Secondary | ICD-10-CM | POA: Diagnosis not present

## 2018-01-19 DIAGNOSIS — I10 Essential (primary) hypertension: Secondary | ICD-10-CM | POA: Diagnosis not present

## 2018-01-19 DIAGNOSIS — Z794 Long term (current) use of insulin: Secondary | ICD-10-CM | POA: Diagnosis not present

## 2018-01-19 DIAGNOSIS — J181 Lobar pneumonia, unspecified organism: Secondary | ICD-10-CM | POA: Diagnosis not present

## 2018-01-22 ENCOUNTER — Ambulatory Visit: Payer: Self-pay | Admitting: Pharmacist

## 2018-01-23 ENCOUNTER — Other Ambulatory Visit: Payer: Self-pay | Admitting: Family Medicine

## 2018-01-23 ENCOUNTER — Other Ambulatory Visit: Payer: Self-pay | Admitting: Pharmacist

## 2018-01-23 ENCOUNTER — Ambulatory Visit: Payer: Self-pay | Admitting: Pharmacist

## 2018-01-23 DIAGNOSIS — E1165 Type 2 diabetes mellitus with hyperglycemia: Principal | ICD-10-CM

## 2018-01-23 DIAGNOSIS — E1142 Type 2 diabetes mellitus with diabetic polyneuropathy: Secondary | ICD-10-CM

## 2018-01-23 DIAGNOSIS — Z794 Long term (current) use of insulin: Principal | ICD-10-CM

## 2018-01-23 DIAGNOSIS — G629 Polyneuropathy, unspecified: Secondary | ICD-10-CM

## 2018-01-23 DIAGNOSIS — IMO0002 Reserved for concepts with insufficient information to code with codable children: Secondary | ICD-10-CM

## 2018-01-23 NOTE — Patient Outreach (Signed)
[place] [facility]) Care Management  01/23/2018  [name] 05-17-1965 [id number]  52 year old female referred to [facility] for diabetes management.  PMH significant for: DMT2, HTN. HLD and recently seen in the ED for SSTI 2/2 diabetes.  Unsuccessful outreach attempt #2 to [name].  Interestingly, voicemail was not available and phone rang >20 times.  PLAN: -I will f/u with patient within 4 business days -Unsuccessful letter sent on 01/17/18   Regina Eck, PharmD, [place]  [phone]

## 2018-01-24 DIAGNOSIS — H40039 Anatomical narrow angle, unspecified eye: Secondary | ICD-10-CM | POA: Diagnosis not present

## 2018-01-25 DIAGNOSIS — Z794 Long term (current) use of insulin: Secondary | ICD-10-CM | POA: Diagnosis not present

## 2018-01-25 DIAGNOSIS — E785 Hyperlipidemia, unspecified: Secondary | ICD-10-CM | POA: Diagnosis not present

## 2018-01-25 DIAGNOSIS — I10 Essential (primary) hypertension: Secondary | ICD-10-CM | POA: Diagnosis not present

## 2018-01-25 DIAGNOSIS — J181 Lobar pneumonia, unspecified organism: Secondary | ICD-10-CM | POA: Diagnosis not present

## 2018-01-25 DIAGNOSIS — Z792 Long term (current) use of antibiotics: Secondary | ICD-10-CM | POA: Diagnosis not present

## 2018-01-25 DIAGNOSIS — E1142 Type 2 diabetes mellitus with diabetic polyneuropathy: Secondary | ICD-10-CM | POA: Diagnosis not present

## 2018-01-25 DIAGNOSIS — N12 Tubulo-interstitial nephritis, not specified as acute or chronic: Secondary | ICD-10-CM | POA: Diagnosis not present

## 2018-01-25 DIAGNOSIS — Z87891 Personal history of nicotine dependence: Secondary | ICD-10-CM | POA: Diagnosis not present

## 2018-01-25 DIAGNOSIS — Z9181 History of falling: Secondary | ICD-10-CM | POA: Diagnosis not present

## 2018-01-26 ENCOUNTER — Other Ambulatory Visit: Payer: Self-pay | Admitting: Pharmacist

## 2018-01-26 ENCOUNTER — Ambulatory Visit: Payer: Self-pay | Admitting: Pharmacist

## 2018-01-26 NOTE — Patient Outreach (Signed)
[name]) Care Management  01/26/2018  [name] [birth year] [id number]  52 year old female referred to [facility] for diabetes management. PMH significant for: DMT2, HTN. HLD and recently seen in the ED for SSTI 2/2 diabetes.  Unsuccessful outreach attempt #3 to [name] regarding disease state management (diabetes).  HIPAA complaint message left on voicemail.   PLAN: -Will attempt final outreach call on 01/30/18 and close case per protocol if no answer   Regina Eck, PharmD, [place]  [phone]

## 2018-01-30 ENCOUNTER — Ambulatory Visit: Payer: Self-pay | Admitting: Pharmacist

## 2018-01-30 DIAGNOSIS — L97511 Non-pressure chronic ulcer of other part of right foot limited to breakdown of skin: Secondary | ICD-10-CM | POA: Diagnosis not present

## 2018-01-30 DIAGNOSIS — E1142 Type 2 diabetes mellitus with diabetic polyneuropathy: Secondary | ICD-10-CM | POA: Diagnosis not present

## 2018-01-31 ENCOUNTER — Other Ambulatory Visit: Payer: Self-pay | Admitting: Pharmacist

## 2018-01-31 ENCOUNTER — Ambulatory Visit: Payer: Self-pay | Admitting: Pharmacist

## 2018-01-31 NOTE — Patient Outreach (Signed)
[name] Care Management  01/31/2018  [name] 1965-05-06 [id number]  52 year old female referred to [place] [facility] for diabetes management. PMH significant for: DMT2, HTN. HLD and recently seen in the ED for SSTI 2/2 diabetes.  Unsuccessful outreach attempt #4to Ms. [place] regarding disease state management (diabetes).  HIPAA complaint message left on voicemail.   PLAN: -R[place]alli will sign off and close case per protocol.  I'm happy to assist in the future as needed  Regina Eck, PharmD, [place]  [phone]

## 2018-02-13 DIAGNOSIS — Z89421 Acquired absence of other right toe(s): Secondary | ICD-10-CM | POA: Diagnosis not present

## 2018-02-13 DIAGNOSIS — E1142 Type 2 diabetes mellitus with diabetic polyneuropathy: Secondary | ICD-10-CM | POA: Diagnosis not present

## 2018-02-13 DIAGNOSIS — L97511 Non-pressure chronic ulcer of other part of right foot limited to breakdown of skin: Secondary | ICD-10-CM | POA: Diagnosis not present

## 2018-02-13 DIAGNOSIS — I739 Peripheral vascular disease, unspecified: Secondary | ICD-10-CM | POA: Diagnosis not present

## 2018-02-13 DIAGNOSIS — B351 Tinea unguium: Secondary | ICD-10-CM | POA: Diagnosis not present

## 2018-02-19 ENCOUNTER — Ambulatory Visit: Payer: Medicare HMO | Admitting: Family Medicine

## 2018-02-22 ENCOUNTER — Telehealth: Payer: Self-pay | Admitting: Family Medicine

## 2018-02-22 DIAGNOSIS — I10 Essential (primary) hypertension: Secondary | ICD-10-CM

## 2018-02-22 NOTE — Telephone Encounter (Signed)
Patient called about elevated BP. Known HTN. She is on Lisinopril 20mg  daily for HTN.  She reported wrist cuff reading BP 180/110, recently elevated >160 in past few days.  She is asymptomatic. Denies any headache, vision change, chest pain, nausea vomiting, syncope, lightheadedness, dizziness.  I advised her that if she developed symptoms as above or if significant worsening BP >200 SBP >110 DBP then she may present immediately to hospital ED for evaluation and re-check and may need testing and immediate treatment for BP.  Otherwise she may closely monitor BP for now in short term.  She has taken Lisinopril 20mg  today already.  I advised her that she may take an extra HALF pill Lisinopril for a total of 30mg  dose today.  Starting tomorrow she may continue with ONE AND HALF Lisinopril for 30mg  daily for now.  She should call us within 1 week with BP log readings. If needed we can add another BP medication such as a Amlodipine to her regimen at that time. Also if needed she may be asked to schedule HTN follow-up in office.  She may keep apt in January 2020 for diabetes.  Answered her questions. She understands the above plan.  [name], [facility] 02/22/2018, 3:05 PM

## 2018-03-23 ENCOUNTER — Telehealth: Payer: Self-pay | Admitting: Family Medicine

## 2018-03-23 ENCOUNTER — Telehealth: Payer: Self-pay

## 2018-03-23 DIAGNOSIS — IMO0002 Reserved for concepts with insufficient information to code with codable children: Secondary | ICD-10-CM

## 2018-03-23 DIAGNOSIS — E1165 Type 2 diabetes mellitus with hyperglycemia: Principal | ICD-10-CM

## 2018-03-23 DIAGNOSIS — E1149 Type 2 diabetes mellitus with other diabetic neurological complication: Secondary | ICD-10-CM

## 2018-03-23 MED ORDER — INSULIN LISPRO (1 UNIT DIAL) 100 UNIT/ML (KWIKPEN): 5.0000 [IU] | PEN_INJECTOR | Freq: Three times a day (TID) | SUBCUTANEOUS | 5 refills | Status: DC

## 2018-03-23 MED ORDER — HUMALOG KWIKPEN 100 UNIT/ML ~~LOC~~ SOPN: 5.0000 [IU] | PEN_INJECTOR | Freq: Three times a day (TID) | SUBCUTANEOUS | 5 refills | Status: DC

## 2018-03-23 NOTE — Telephone Encounter (Signed)
The pt called with concerns because she checked her blood pressure using her wrist bp monitor and got a reading of 128/50.  She is concern that this reading is to low. I instructed the pt to recheck her blood pressure and encouraged her that diastolic is lower than her normal , but not abnormal. The pt is asymptomatic, no dizziness, lightheadedness or any other symptoms. She just randomly checked her blood pressure. I advised the patient to recheck her blood pressure later today and if she can go by her local pharmacy and check her blood pressure sometime this weekend and call us back with her readings. She verbalize understanding.

## 2018-03-23 NOTE — Telephone Encounter (Signed)
Called patient and sister, Santiago Glad, and spoke with sister about her concerns. See note below. Patient has had issues with med adherence for her diabetes, and has been referred to [facility] for management of her diabetes. They last saw her in August 2019. She was started on detailed insulin regimen with Lantus Insulin 50 units daily and Novolog Kwikpen 5u TID WC and sliding scale - listed below.  Now sister is reporting concern that patient was out of Novolog for past 1 month and sugars were elevated up to 400-500. She was out of Novolog because insurance did not cover it any more. Now they want to get an alternative insulin. They are aware of Endocrine apt coming up Endocrinology 11/27 lab and then office visit 12/4.  They called insurance, and confirmed Humalog Kwikpen insulin is covered.  I have send new e-script and called [facility] to confirm, brand name Humalog sent, they can pick up tonight and f/u with Endocrinology  --------- Patient has copy of this Sliding Scale Chart at home  If BG 150-200=add 2 units If BG 201-250=add 4 units If BG 251-300=add 6 units If BG 301-350=add 8 units If BG 351-400=add 10 units If BG >400=add 12 units  [name], DO [facility] 03/23/2018, 5:09 PM

## 2018-03-23 NOTE — Telephone Encounter (Signed)
Spoke to sister calling insurance company to find out alternative, pt has endocrinologist and phone call was transferred to Dt. [facility].

## 2018-03-23 NOTE — Telephone Encounter (Signed)
Pt's insurance will not pay for novalog and her blood sugar is 499.  Please sent prescription for [facility] [address].  Her call back number is [phone]

## 2018-03-23 NOTE — Telephone Encounter (Signed)
Reviewed triage. No new concerns. Already spoke with patient and her sister today. See other phone note  [name], [facility] 03/23/2018, 5:26 PM

## 2018-03-28 DIAGNOSIS — E1169 Type 2 diabetes mellitus with other specified complication: Secondary | ICD-10-CM | POA: Diagnosis not present

## 2018-03-28 DIAGNOSIS — Z794 Long term (current) use of insulin: Secondary | ICD-10-CM | POA: Diagnosis not present

## 2018-03-28 DIAGNOSIS — E785 Hyperlipidemia, unspecified: Secondary | ICD-10-CM | POA: Diagnosis not present

## 2018-03-28 DIAGNOSIS — E1165 Type 2 diabetes mellitus with hyperglycemia: Secondary | ICD-10-CM | POA: Diagnosis not present

## 2018-03-28 LAB — HEMOGLOBIN A1C: Hemoglobin A1C: 11.9

## 2018-04-04 DIAGNOSIS — E1159 Type 2 diabetes mellitus with other circulatory complications: Secondary | ICD-10-CM | POA: Diagnosis not present

## 2018-04-04 DIAGNOSIS — E1169 Type 2 diabetes mellitus with other specified complication: Secondary | ICD-10-CM | POA: Diagnosis not present

## 2018-04-04 DIAGNOSIS — G629 Polyneuropathy, unspecified: Secondary | ICD-10-CM | POA: Diagnosis not present

## 2018-04-04 DIAGNOSIS — E1129 Type 2 diabetes mellitus with other diabetic kidney complication: Secondary | ICD-10-CM | POA: Diagnosis not present

## 2018-04-04 DIAGNOSIS — E1165 Type 2 diabetes mellitus with hyperglycemia: Secondary | ICD-10-CM | POA: Diagnosis not present

## 2018-04-27 ENCOUNTER — Telehealth: Payer: Self-pay

## 2018-04-27 NOTE — Telephone Encounter (Signed)
Patient has called c/o chest congestion for 1 week. Patient has not tried any meds OTC.  She is requesting an [facility] to [facility] [place].  I did tell patient that we normally require an office visit, however, she wanted me to send a message.

## 2018-04-27 NOTE — Telephone Encounter (Signed)
Her call back [phone]

## 2018-04-27 NOTE — Telephone Encounter (Signed)
Patient advised to schedule appointment and if no opening than go to urgent care.

## 2018-04-30 ENCOUNTER — Ambulatory Visit (INDEPENDENT_AMBULATORY_CARE_PROVIDER_SITE_OTHER): Payer: Medicare Other | Admitting: Family Medicine

## 2018-04-30 ENCOUNTER — Encounter: Payer: Self-pay | Admitting: Family Medicine

## 2018-04-30 VITALS — BP 113/67 | HR 102 | Temp 98.5°F | Resp 16 | Ht 70.5 in | Wt 259.0 lb

## 2018-04-30 DIAGNOSIS — J0100 Acute maxillary sinusitis, unspecified: Secondary | ICD-10-CM

## 2018-04-30 MED ORDER — BENZONATATE 100 MG PO CAPS: 100.0000 mg | ORAL_CAPSULE | Freq: Three times a day (TID) | ORAL | 0 refills | Status: DC | PRN

## 2018-04-30 MED ORDER — AZITHROMYCIN 250 MG PO TABS: ORAL_TABLET | ORAL | 0 refills | Status: DC

## 2018-04-30 NOTE — Patient Instructions (Addendum)
Thank you for coming to the office today.  1. It sounds like you had an Upper Respiratory Virus that has settled into a Bronchitis, lower respiratory tract infection. I don't have concerns for pneumonia today, and think that this should gradually improve. Once you are feeling better, the cough may take a few weeks to fully resolve.  I do not hear wheezing today  Start Azithromycin Z pak (antibiotic) 2 tabs day 1, then 1 tab x 4 days, complete entire course even if improved  Start Tessalon Perls take 1 capsule up to 3 times a day as needed for cough  - Use nasal saline (Simply Saline or Ocean Spray) to flush nasal congestion multiple times a day, may help cough - Drink plenty of fluids to improve congestion  May try OTC Mucinex (or may try Mucinex-DM for cough) up to 7-10 days then stop  If your symptoms seem to worsen instead of improve over next several days, including significant fever / chills, worsening shortness of breath, worsening wheezing, or nausea / vomiting and can't take medicines - return sooner or go to hospital Emergency Department for more immediate treatment.   Please schedule a Follow-up Appointment to: Return in about 1 week (around 05/07/2018), or if symptoms worsen or fail to improve, for URI sinusitis if not resolved.  If you have any other questions or concerns, please feel free to call the office or send a message through [place]. You may also schedule an earlier appointment if necessary.  Additionally, you may be receiving a survey about your experience at our office within a few days to 1 week by e-mail or mail. We value your feedback.  [name], DO [place]

## 2018-04-30 NOTE — Progress Notes (Signed)
Subjective:    Patient ID: [name], female    DOB: [birth year], 52 y.o.   MRN: [id number]  [name] is a 52 y.o. female presenting on 04/30/2018 for chest congestion (onset week may be fever didn't check, cough)  Accompanied by mother [name], who provides additional history.  HPI   Sinusitis / Chest Congestion Reports symptoms started about 1 week ago with chest congestion, she called our office Friday 12/27 afternoon to request antibiotic, and she was advised to start OTC medicine and follow-up this week. - Today she reports persistent sinus and cough symptoms, seems to have lingering cough now as well, with sinus nasal region pressure and pain, and some thicker congestion often. - Admits some initial fevers, without chills, ear pain - Denies abdominal pain nausea vomiting, body aches, productive cough  Depression screen [facility] 2/9 04/30/2018 01/03/2018 12/13/2017  Decreased Interest 0 0 0  Down, Depressed, Hopeless 0 0 0  PHQ - 2 Score 0 0 0  Altered sleeping - 0 -  Tired, decreased energy - 0 -  Change in appetite - 0 -  Feeling bad or failure about yourself  - 0 -  Trouble concentrating - 0 -  Moving slowly or fidgety/restless - 0 -  Suicidal thoughts - 0 -  PHQ-9 Score - 0 -  Difficult doing work/chores - Not difficult at all -    Social History   Tobacco Use  . Smoking status: Former Smoker    Packs/day: 3.00    Years: 30.00    Pack years: 90.00    Types: Cigarettes    Last attempt to quit: 05/02/2009    Years since quitting: 9.0  . Smokeless tobacco: Former Systems developer  . Tobacco comment: Pt reported  quitting in 2011  Substance Use Topics  . Alcohol use: No  . Drug use: No    Review of Systems Per HPI unless specifically indicated above     Objective:    BP 113/67   Pulse (!) 102   Temp 98.5 F (36.9 C) (Oral)   Resp 16   Ht 5' 10.5" (1.791 m)   Wt 259 lb (117.5 kg)   SpO2 100%   BMI 36.64 kg/m   Wt Readings from Last 3  Encounters:  04/30/18 259 lb (117.5 kg)  01/12/18 250 lb 12.8 oz (113.8 kg)  01/12/18 250 lb 12.8 oz (113.8 kg)    Physical Exam Vitals signs and nursing note reviewed.  Constitutional:      General: She is not in acute distress.    Appearance: She is well-developed. She is not diaphoretic.     Comments: Well-appearing, comfortable, cooperative  HENT:     Head: Normocephalic and atraumatic.     Comments: Mild maxillary sinuses tender. Nares patent without purulence or edema. Bilateral TMs clear without erythema, effusion or bulging. Oropharynx clear without erythema, exudates, edema or asymmetry. Eyes:     General:        Right eye: No discharge.        Left eye: No discharge.     Conjunctiva/sclera: Conjunctivae normal.  Neck:     Musculoskeletal: Normal range of motion and neck supple.     Thyroid: No thyromegaly.  Cardiovascular:     Rate and Rhythm: Normal rate and regular rhythm.     Heart sounds: Normal heart sounds. No murmur.  Pulmonary:     Effort: Pulmonary effort is normal. No respiratory distress.     Breath sounds: Normal  breath sounds. No wheezing or rales.     Comments: Good air movement. No coughing. No focal abnormality Musculoskeletal: Normal range of motion.  Lymphadenopathy:     Cervical: No cervical adenopathy.  Skin:    General: Skin is warm and dry.     Findings: No erythema or rash.  Neurological:     Mental Status: She is alert and oriented to person, place, and time.  Psychiatric:        Behavior: Behavior normal.     Comments: Well groomed, good eye contact, normal speech and thoughts    Results for orders placed or performed during the hospital encounter of 01/12/18  Lactic acid, plasma  Result Value Ref Range   Lactic Acid, Venous 1.2 0.5 - 1.9 mmol/L  Comprehensive metabolic panel  Result Value Ref Range   Sodium 133 (L) 135 - 145 mmol/L   Potassium 4.5 3.5 - 5.1 mmol/L   Chloride 99 98 - 111 mmol/L   CO2 26 22 - 32 mmol/L   Glucose,  Bld 182 (H) 70 - 99 mg/dL   BUN 14 6 - 20 mg/dL   Creatinine, Ser 0.79 0.44 - 1.00 mg/dL   Calcium 9.7 8.9 - 10.3 mg/dL   Total Protein 7.6 6.5 - 8.1 g/dL   Albumin 4.0 3.5 - 5.0 g/dL   AST 22 15 - 41 U/L   ALT 29 0 - 44 U/L   Alkaline Phosphatase 105 38 - 126 U/L   Total Bilirubin 0.2 (L) 0.3 - 1.2 mg/dL   GFR calc non Af Amer >60 >60 mL/min   GFR calc Af Amer >60 >60 mL/min   Anion gap 8 5 - 15  CBC with Differential  Result Value Ref Range   WBC 11.1 (H) 3.6 - 11.0 K/uL   RBC 3.95 3.80 - 5.20 MIL/uL   Hemoglobin 11.3 (L) 12.0 - 16.0 g/dL   HCT 33.6 (L) 35.0 - 47.0 %   MCV 85.2 80.0 - 100.0 fL   MCH 28.7 26.0 - 34.0 pg   MCHC 33.7 32.0 - 36.0 g/dL   RDW 16.2 (H) 11.5 - 14.5 %   Platelets 248 150 - 440 K/uL   Neutrophils Relative % 70 %   Neutro Abs 7.8 (H) 1.4 - 6.5 K/uL   Lymphocytes Relative 23 %   Lymphs Abs 2.5 1.0 - 3.6 K/uL   Monocytes Relative 4 %   Monocytes Absolute 0.5 0.2 - 0.9 K/uL   Eosinophils Relative 2 %   Eosinophils Absolute 0.2 0 - 0.7 K/uL   Basophils Relative 1 %   Basophils Absolute 0.1 0 - 0.1 K/uL  Glucose, capillary  Result Value Ref Range   Glucose-Capillary 164 (H) 70 - 99 mg/dL      Assessment & Plan:   Problem List Items Addressed This Visit    None    Visit Diagnoses    Acute non-recurrent maxillary sinusitis    -  Primary   Relevant Medications   azithromycin (ZITHROMAX Z-PAK) 250 MG tablet   benzonatate (TESSALON) 100 MG capsule      Consistent with acute maxillary rhinosinusitis, likely initially viral URI with worsening concern for bacterial infection with persistent symptoms, history of pneumonia previously 11/2017, uncontrolled diabetes, higher risk patient.  Plan: 1. Start Azithromycin Z pak (antibiotic) 2 tabs day 1, then 1 tab x 4 days, complete entire course even if improved 2. Start Tessalon Perls take 1 capsule up to 3 times a day as needed for cough 3.  Mucinex OTC 4. Return criteria reviewed - if not improving,  worsening, concerns for pneumonia or other issue, may warrant chest x-ray or further evaluation   Meds ordered this encounter  Medications  . azithromycin (ZITHROMAX Z-PAK) 250 MG tablet    Sig: Take 2 tabs (500mg  total) on Day 1. Take 1 tab (250mg ) daily for next 4 days.    Dispense:  6 tablet    Refill:  0  . benzonatate (TESSALON) 100 MG capsule    Sig: Take 1 capsule (100 mg total) by mouth 3 (three) times daily as needed for cough.    Dispense:  30 capsule    Refill:  0    Follow up plan: Return in about 1 week (around 05/07/2018), or if symptoms worsen or fail to improve, for URI sinusitis if not resolved.  [name], DO [facility] 04/30/2018, 11:58 AM

## 2018-05-04 ENCOUNTER — Ambulatory Visit: Payer: Medicare Other | Admitting: Family Medicine

## 2018-05-16 DIAGNOSIS — E1165 Type 2 diabetes mellitus with hyperglycemia: Secondary | ICD-10-CM | POA: Insufficient documentation

## 2018-05-16 DIAGNOSIS — E1129 Type 2 diabetes mellitus with other diabetic kidney complication: Secondary | ICD-10-CM | POA: Insufficient documentation

## 2018-05-17 DIAGNOSIS — E1165 Type 2 diabetes mellitus with hyperglycemia: Secondary | ICD-10-CM | POA: Diagnosis not present

## 2018-05-17 DIAGNOSIS — B351 Tinea unguium: Secondary | ICD-10-CM | POA: Diagnosis not present

## 2018-05-17 DIAGNOSIS — I739 Peripheral vascular disease, unspecified: Secondary | ICD-10-CM | POA: Diagnosis not present

## 2018-05-17 DIAGNOSIS — G629 Polyneuropathy, unspecified: Secondary | ICD-10-CM | POA: Diagnosis not present

## 2018-05-17 DIAGNOSIS — R600 Localized edema: Secondary | ICD-10-CM | POA: Diagnosis not present

## 2018-05-17 DIAGNOSIS — E1159 Type 2 diabetes mellitus with other circulatory complications: Secondary | ICD-10-CM | POA: Diagnosis not present

## 2018-05-17 DIAGNOSIS — E1142 Type 2 diabetes mellitus with diabetic polyneuropathy: Secondary | ICD-10-CM | POA: Diagnosis not present

## 2018-05-17 DIAGNOSIS — E1129 Type 2 diabetes mellitus with other diabetic kidney complication: Secondary | ICD-10-CM | POA: Diagnosis not present

## 2018-05-17 DIAGNOSIS — Z89421 Acquired absence of other right toe(s): Secondary | ICD-10-CM | POA: Diagnosis not present

## 2018-05-17 DIAGNOSIS — E1169 Type 2 diabetes mellitus with other specified complication: Secondary | ICD-10-CM | POA: Diagnosis not present

## 2018-05-24 ENCOUNTER — Telehealth: Payer: Self-pay | Admitting: Family Medicine

## 2018-05-24 NOTE — Telephone Encounter (Signed)
Pt needs refills on test strips sent to [facility] [address].  Pt is out.  Her call back number is [phone]

## 2018-05-25 ENCOUNTER — Other Ambulatory Visit: Payer: Self-pay

## 2018-05-25 NOTE — Telephone Encounter (Signed)
Called pharmacy the insurance probably won't cover her Rx since she is checking more than 5 times a day advised patient to call endocrinology and see if they can send this Rx so she can be covered.

## 2018-05-30 ENCOUNTER — Other Ambulatory Visit: Payer: Self-pay | Admitting: Family Medicine

## 2018-05-30 DIAGNOSIS — I10 Essential (primary) hypertension: Secondary | ICD-10-CM

## 2018-06-04 ENCOUNTER — Ambulatory Visit: Payer: Medicare Other | Admitting: Family Medicine

## 2018-06-18 ENCOUNTER — Ambulatory Visit: Payer: Medicare Other | Admitting: Family Medicine

## 2018-06-25 ENCOUNTER — Other Ambulatory Visit: Payer: Self-pay | Admitting: Family Medicine

## 2018-06-25 DIAGNOSIS — Z794 Long term (current) use of insulin: Principal | ICD-10-CM

## 2018-06-25 DIAGNOSIS — E1165 Type 2 diabetes mellitus with hyperglycemia: Principal | ICD-10-CM

## 2018-06-25 DIAGNOSIS — IMO0001 Reserved for inherently not codable concepts without codable children: Secondary | ICD-10-CM

## 2018-07-27 ENCOUNTER — Encounter: Payer: Self-pay | Admitting: Family Medicine

## 2018-07-27 ENCOUNTER — Other Ambulatory Visit: Payer: Self-pay

## 2018-07-27 ENCOUNTER — Telehealth: Payer: Self-pay | Admitting: Family Medicine

## 2018-07-27 ENCOUNTER — Ambulatory Visit (INDEPENDENT_AMBULATORY_CARE_PROVIDER_SITE_OTHER): Payer: Medicare Other | Admitting: Family Medicine

## 2018-07-27 DIAGNOSIS — E1165 Type 2 diabetes mellitus with hyperglycemia: Secondary | ICD-10-CM | POA: Diagnosis not present

## 2018-07-27 DIAGNOSIS — E1149 Type 2 diabetes mellitus with other diabetic neurological complication: Secondary | ICD-10-CM | POA: Diagnosis not present

## 2018-07-27 DIAGNOSIS — IMO0002 Reserved for concepts with insufficient information to code with codable children: Secondary | ICD-10-CM

## 2018-07-27 NOTE — Progress Notes (Signed)
Virtual Visit via Telephone The purpose of this virtual visit is to provide medical care while limiting exposure to the novel coronavirus (COVID19) for both patient and office staff.  Consent was obtained for phone visit:  Yes.   Answered questions that patient had about telehealth interaction:  Yes.   I discussed the limitations, risks, security and privacy concerns of performing an evaluation and management service by telephone. I also discussed with the patient that there may be a patient responsible charge related to this service. The patient expressed understanding and agreed to proceed.  Patient Location: Home Provider Location: [facility], In (Office)  ---------------------------------------------------------------------- CC: Diabetes, hyperglycemia  S: Reviewed CMA telephone note below. I have called patient and gathered additional HPI as follows:  UNCONTROLLED CHRONIC DM, Type 2, with Neuropathy, Hyperglycemia Followed by [facility]ology referred earlier in 2019, has been managed by them on insulin regimen, last A1c was 11.9 (03/2018) Patient is asking today about restarting Trulicity, used to take GLP1 but eventually stopped due to some nausea but it helped her blood sugar. CBGs: Avg fasting 250-300+, Low none Meds:  - Metformin 1026m BID - Lantus 53u nightly - Novolog 5u TID with meals, + SSI 2u if CBG 150-200, and then +2u for every 50 above 200 Reports  good compliance. Tolerating well w/o side-effects Currently on ACEi  Lifestyle: - Diet (improving, reduced sweets, more vegetable options)  - Exercise (now starting to walk again, outdoors on driveway several laps) Denies hypoglycemia, polyuria, visual changes, numbness or tingling. Denies any fevers, chills, sweats, body ache, cough, shortness of breath, sinus pain or pressure  Past Medical History:  Diagnosis Date  . Depression   . Hyperlipidemia   . Hypertension   . Neuromuscular disorder  ([facility]   . Schizo affective schizophrenia (HWebb    abilify and pazil   Social History   Tobacco Use  . Smoking status: Former Smoker    Packs/day: 3.00    Years: 30.00    Pack years: 90.00    Types: Cigarettes    Last attempt to quit: 05/02/2009    Years since quitting: 9.2  . Smokeless tobacco: Former USystems developer . Tobacco comment: Pt reported  quitting in 2011  Substance Use Topics  . Alcohol use: No  . Drug use: No    Current Outpatient Medications:  .  acetaminophen (TYLENOL) 325 MG tablet, Take 2 tablets (650 mg total) by mouth every 6 (six) hours as needed for mild pain (or Fever >/= 101)., Disp: , Rfl:  .  ARIPiprazole (ABILIFY) 10 MG tablet, Take 10 mg by mouth daily., Disp: , Rfl:  .  atorvastatin (LIPITOR) 40 MG tablet, Take 1 tablet (40 mg total) by mouth at bedtime., Disp: 90 tablet, Rfl: 3 .  B-D UF III MINI PEN NEEDLES 31G X 5 MM MISC, USE AS DIRECTED, Disp: 100 each, Rfl: 3 .  Blood Glucose Monitoring Suppl (ONE TOUCH ULTRA SYSTEM KIT) w/Device KIT, 1 kit by Does not apply route once., Disp: 1 each, Rfl: 0 .  gabapentin (NEURONTIN) 100 MG capsule, Take 1 capsule (100 mg total) by mouth 3 (three) times daily. SEE NOTES, Disp: 270 capsule, Rfl: 1 .  HUMALOG KWIKPEN 100 UNIT/ML KwikPen, Inject 0.05 mLs (5 Units total) into the skin 3 (three) times daily with meals. + Sliding scale, Disp: 15 mL, Rfl: 5 .  ibuprofen (ADVIL,MOTRIN) 600 MG tablet, TK 1 T PO Q 6-8 H PRN P, Disp: , Rfl: 0 .  Insulin Glargine (  BASAGLAR KWIKPEN [place]), Inject into the skin., Disp: , Rfl:  .  Insulin Glargine (LANTUS SOLOSTAR) 100 UNIT/ML Solostar Pen, Inject 50 Units into the skin daily. Adjust dose as advised (Patient taking differently: Inject 50 Units into the skin daily. Adjust dose as advised), Disp: 15 mL, Rfl: 1 .  lisinopril (PRINIVIL,ZESTRIL) 20 MG tablet, TAKE 1 TABLET(20 MG) BY MOUTH DAILY, Disp: 90 tablet, Rfl: 1 .  metFORMIN (GLUCOPHAGE) 1000 MG tablet, TAKE 1 TABLET(1000 MG) BY MOUTH TWICE  DAILY WITH A MEAL, Disp: 180 tablet, Rfl: 1 .  ONETOUCH VERIO test strip, Check blood sugar up to 3 x daily as needed, Disp: 100 each, Rfl: 11 .  PARoxetine (PAXIL) 20 MG tablet, Take 1 tablet (20 mg total) by mouth daily. Prescribed by [facility], Disp: 30 tablet, Rfl: 0 .  azithromycin (ZITHROMAX Z-PAK) 250 MG tablet, Take 2 tabs (533m total) on Day 1. Take 1 tab (2532m daily for next 4 days., Disp: 6 tablet, Rfl: 0 .  benzonatate (TESSALON) 100 MG capsule, Take 1 capsule (100 mg total) by mouth 3 (three) times daily as needed for cough., Disp: 30 capsule, Rfl: 0  [facility] 07/27/2018 04/30/2018 01/03/2018  Decreased Interest 0 0 0  Down, Depressed, Hopeless 0 0 0  PHQ - 2 Score 0 0 0  Altered sleeping - - 0  Tired, decreased energy - - 0  Change in appetite - - 0  Feeling bad or failure about yourself  - - 0  Trouble concentrating - - 0  Moving slowly or fidgety/restless - - 0  Suicidal thoughts - - 0  PHQ-9 Score - - 0  Difficult doing work/chores - - Not difficult at all    GAD 7 : Generalized Anxiety Score 01/03/2018  Nervous, Anxious, on Edge 0  Control/stop worrying 0  Worry too much - different things 0  Trouble relaxing 0  Restless 0  Easily annoyed or irritable 0  Afraid - awful might happen 0  Total GAD 7 Score 0  Anxiety Difficulty Not difficult at all    -------------------------------------------------------------------------- O: No physical exam performed due to remote telephone encounter.   No results found for this or any previous visit (from the past 2160 hour(s)).  [facility] - Hgb A1c 11.9  -------------------------------------------------------------------------- A&P:  Problem List Items Addressed This Visit    DM (diabetes mellitus), type 2, 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[place]     [facility], [facility] failed initial management,  uncontrolled Complication with peripheral neuropathy, morbid obesity, hyperglycemia  Plan - Consider re-start Trulicity, as patient did well on this in past for A1c but she had intolerance side effect of nausea, advised that since Endocrinology is primarily managing her diabetes now, she needed to contact them about this, as I would prefer to discuss it with them and have them evaluate her and rx if appropriate, as opposed to changing their treatment plan myself. Patient understood, and she [facility].  - For now, continue current med regimen as rx by Endocrinology metformin 100056mID, Lantus 53u nightly and Novolog 5u TID WC + SSI  - Encourage reduce carb / sugar intake and improve regular exercise as she is   No orders of the defined types were placed in this encounter.   Follow-up: - Return as needed if not improving / follow-up as planned in approx Fall 2020  Patient verbalizes understanding with the  above medical recommendations including the limitation of remote medical advice.  Specific follow-up and call-back criteria were given for patient to follow-up or seek medical care more urgently if needed.  - Time spent in direct consultation with patient on phone: 8 minutes  [name], [facility] 07/27/2018, 2:07 PM

## 2018-07-27 NOTE — Telephone Encounter (Signed)
[facility] called back, and I spoke with [name] NP nurse regarding this patient.  I reviewed message below. They understand and will call patient directly today to discuss follow-up plan, patient had no show or cancelled a recent apt with them. They will consider Trulicity or other GLP1 for this patient and follow-up  [name], [facility] 07/27/2018, 3:01 PM

## 2018-07-27 NOTE — Telephone Encounter (Signed)
Patient had a telephone virtual visit today 07/27/18 regarding her diabetes.  She is followed by [facility]  [place], [place] [address]  [phone]  [address], [address], [place] [address]  [phone]   I attempted to call them at [phone] - did not reach anyone today 3/27, I left a voicemail.  Patient is asking [place] about restarting Trulicity. She says her sugar have been higher and insulin is not working.  My request is that her Endocrinology contact patient and discuss a plan or have her return to them for further management, and if they can consider Trulicity or one of the other GLP1 that worked for patient before.  [name], [facility] 07/27/2018, 2:28 PM

## 2018-07-27 NOTE — Patient Instructions (Signed)
We will contact [facility] for you to review the possibility of Trulicity and other medications  They should call you back next week with information or at least request that you schedule an appointment again to discuss that.  If you have any other questions or concerns, please feel free to call the office or send a message through [place]. You may also schedule an earlier appointment if necessary.  Additionally, you may be receiving a survey about your experience at our office within a few days to 1 week by e-mail or mail. We value your feedback.  [name], DO [place]

## 2018-07-31 ENCOUNTER — Telehealth: Payer: Self-pay

## 2018-07-31 NOTE — Telephone Encounter (Signed)
See recent documentation from Telephone Virtual Visit 3/27  Patient was advised to follow-up promptly with [facility] - they attempted to schedule her but she may not have followed up. The question was regarding hyperglycemia and possibly restarting Trulicity GLP1 at that time.  Now patient is complaining of hypoglycemia. She is calling to ask about stopping lantus.  Please notify patient that I am not actively managing her diabetes - this is done by her Endocrinology specialist, she needs to discuss with them regarding her insulin management / dosing / med changes.  My general advice is that if her blood sugar is < 120 fasting in the morning, she can hold or skip her Novolog mealtime dosing to avoid low sugar - but she needs to ask Endocrinology for any more specific instructions please.  [name], [facility] 07/31/2018, 10:36 AM

## 2018-07-31 NOTE — Telephone Encounter (Signed)
The pt was notified. No questions or concerns. She verbalize understanding.

## 2018-07-31 NOTE — Telephone Encounter (Signed)
Patient was concerned about her sugar level now at level 120-130 mg/dl. She is afraid of getting hypoglycemic and wants to stop Lantus I advised her to take all her medication the way how it is Rx and call Endocrinologist for their opinion. She didn't show up for their appointment because she was afraid to go to the hospital.

## 2018-08-01 DIAGNOSIS — E1165 Type 2 diabetes mellitus with hyperglycemia: Secondary | ICD-10-CM | POA: Diagnosis not present

## 2018-08-01 DIAGNOSIS — E1169 Type 2 diabetes mellitus with other specified complication: Secondary | ICD-10-CM | POA: Diagnosis not present

## 2018-08-01 DIAGNOSIS — G629 Polyneuropathy, unspecified: Secondary | ICD-10-CM | POA: Diagnosis not present

## 2018-08-01 DIAGNOSIS — E1159 Type 2 diabetes mellitus with other circulatory complications: Secondary | ICD-10-CM | POA: Diagnosis not present

## 2018-08-06 ENCOUNTER — Telehealth: Payer: Self-pay | Admitting: Family Medicine

## 2018-08-06 ENCOUNTER — Other Ambulatory Visit: Payer: Self-pay | Admitting: Family Medicine

## 2018-08-06 DIAGNOSIS — IMO0001 Reserved for inherently not codable concepts without codable children: Secondary | ICD-10-CM

## 2018-08-06 DIAGNOSIS — Z794 Long term (current) use of insulin: Principal | ICD-10-CM

## 2018-08-06 DIAGNOSIS — E1165 Type 2 diabetes mellitus with hyperglycemia: Principal | ICD-10-CM

## 2018-08-06 NOTE — Telephone Encounter (Signed)
Due to Covid 19 the pharmacy at [place], [place] this location is closed but they do have other location open at [place] advised Ezrah to fill her Rx from there instead of switching pharmacy. She understood very well.

## 2018-08-06 NOTE — Telephone Encounter (Signed)
Left message

## 2018-08-06 NOTE — Telephone Encounter (Signed)
Pt called REQUESTING ALL MEDICATION  TO BE  CALLED INTO  [facility] . PT call back # is  [phone]

## 2018-09-25 ENCOUNTER — Ambulatory Visit: Payer: Self-pay | Admitting: Family Medicine

## 2018-09-25 NOTE — Chronic Care Management (AMB) (Signed)
Chronic Care Management   Note  09/25/2018 Name: [name] MRN: [id number] DOB: 11/14/1965  [name] is a 53 y.o. year old female who is a primary care patient of [name], DO. I reached out to [employer] by phone today in response to a referral sent by [name] health plan.    [name] was given information about Chronic Care Management services today including:  1. CCM service includes personalized support from designated clinical staff supervised by her physician, including individualized plan of care and coordination with other care providers 2. 24/7 contact phone numbers for assistance for urgent and routine care needs. 3. Service will only be billed when office clinical staff spend 20 minutes or more in a month to coordinate care. 4. Only one practitioner may furnish and bill the service in a calendar month. 5. The patient may stop CCM services at any time (effective at the end of the month) by phone call to the office staff. 6. The patient will be responsible for cost sharing (co-pay) of up to 20% of the service fee (after annual deductible is met).  Patient agreed to services and verbal consent obtained.   Follow up plan: Telephone appointment with CCM team member scheduled for: 10/15/2018  [place]  ??[email or web address]   ??[phone]

## 2018-10-15 ENCOUNTER — Telehealth: Payer: Medicare Other

## 2018-10-16 DIAGNOSIS — L089 Local infection of the skin and subcutaneous tissue, unspecified: Secondary | ICD-10-CM | POA: Diagnosis not present

## 2018-10-16 DIAGNOSIS — R110 Nausea: Secondary | ICD-10-CM | POA: Diagnosis not present

## 2018-10-16 DIAGNOSIS — S81802A Unspecified open wound, left lower leg, initial encounter: Secondary | ICD-10-CM | POA: Diagnosis not present

## 2018-10-17 ENCOUNTER — Other Ambulatory Visit: Payer: Self-pay

## 2018-10-17 ENCOUNTER — Encounter: Payer: Self-pay | Admitting: *Deleted

## 2018-10-17 ENCOUNTER — Inpatient Hospital Stay
Admission: EM | Admit: 2018-10-17 | Discharge: 2018-10-21 | DRG: 872 | Disposition: A | Discharge status: Home Health | Payer: Medicare Other | Attending: Internal Medicine | Admitting: Internal Medicine

## 2018-10-17 DIAGNOSIS — Z794 Long term (current) use of insulin: Secondary | ICD-10-CM

## 2018-10-17 DIAGNOSIS — Z87891 Personal history of nicotine dependence: Secondary | ICD-10-CM | POA: Diagnosis not present

## 2018-10-17 DIAGNOSIS — R000 Tachycardia, unspecified: Secondary | ICD-10-CM | POA: Diagnosis not present

## 2018-10-17 DIAGNOSIS — L97419 Non-pressure chronic ulcer of right heel and midfoot with unspecified severity: Secondary | ICD-10-CM | POA: Diagnosis not present

## 2018-10-17 DIAGNOSIS — E860 Dehydration: Secondary | ICD-10-CM | POA: Diagnosis not present

## 2018-10-17 DIAGNOSIS — R0689 Other abnormalities of breathing: Secondary | ICD-10-CM | POA: Diagnosis not present

## 2018-10-17 DIAGNOSIS — E785 Hyperlipidemia, unspecified: Secondary | ICD-10-CM | POA: Diagnosis not present

## 2018-10-17 DIAGNOSIS — Z1159 Encounter for screening for other viral diseases: Secondary | ICD-10-CM

## 2018-10-17 DIAGNOSIS — E119 Type 2 diabetes mellitus without complications: Secondary | ICD-10-CM | POA: Diagnosis not present

## 2018-10-17 DIAGNOSIS — N179 Acute kidney failure, unspecified: Secondary | ICD-10-CM | POA: Diagnosis not present

## 2018-10-17 DIAGNOSIS — I10 Essential (primary) hypertension: Secondary | ICD-10-CM | POA: Diagnosis present

## 2018-10-17 DIAGNOSIS — L03116 Cellulitis of left lower limb: Secondary | ICD-10-CM | POA: Diagnosis not present

## 2018-10-17 DIAGNOSIS — E1142 Type 2 diabetes mellitus with diabetic polyneuropathy: Secondary | ICD-10-CM | POA: Diagnosis present

## 2018-10-17 DIAGNOSIS — F259 Schizoaffective disorder, unspecified: Secondary | ICD-10-CM | POA: Diagnosis present

## 2018-10-17 DIAGNOSIS — G709 Myoneural disorder, unspecified: Secondary | ICD-10-CM | POA: Diagnosis present

## 2018-10-17 DIAGNOSIS — R531 Weakness: Secondary | ICD-10-CM | POA: Diagnosis not present

## 2018-10-17 DIAGNOSIS — M79605 Pain in left leg: Secondary | ICD-10-CM | POA: Diagnosis not present

## 2018-10-17 DIAGNOSIS — E11621 Type 2 diabetes mellitus with foot ulcer: Secondary | ICD-10-CM | POA: Diagnosis present

## 2018-10-17 DIAGNOSIS — L97411 Non-pressure chronic ulcer of right heel and midfoot limited to breakdown of skin: Secondary | ICD-10-CM | POA: Diagnosis not present

## 2018-10-17 DIAGNOSIS — L97429 Non-pressure chronic ulcer of left heel and midfoot with unspecified severity: Secondary | ICD-10-CM | POA: Diagnosis not present

## 2018-10-17 DIAGNOSIS — S91309A Unspecified open wound, unspecified foot, initial encounter: Secondary | ICD-10-CM | POA: Diagnosis not present

## 2018-10-17 DIAGNOSIS — R6520 Severe sepsis without septic shock: Secondary | ICD-10-CM | POA: Diagnosis present

## 2018-10-17 DIAGNOSIS — Z79899 Other long term (current) drug therapy: Secondary | ICD-10-CM | POA: Diagnosis not present

## 2018-10-17 DIAGNOSIS — J9811 Atelectasis: Secondary | ICD-10-CM | POA: Diagnosis not present

## 2018-10-17 DIAGNOSIS — N170 Acute kidney failure with tubular necrosis: Secondary | ICD-10-CM

## 2018-10-17 DIAGNOSIS — L97422 Non-pressure chronic ulcer of left heel and midfoot with fat layer exposed: Secondary | ICD-10-CM | POA: Diagnosis not present

## 2018-10-17 DIAGNOSIS — F329 Major depressive disorder, single episode, unspecified: Secondary | ICD-10-CM | POA: Diagnosis present

## 2018-10-17 DIAGNOSIS — E1165 Type 2 diabetes mellitus with hyperglycemia: Secondary | ICD-10-CM | POA: Diagnosis not present

## 2018-10-17 DIAGNOSIS — S91301A Unspecified open wound, right foot, initial encounter: Secondary | ICD-10-CM | POA: Diagnosis not present

## 2018-10-17 DIAGNOSIS — E875 Hyperkalemia: Secondary | ICD-10-CM | POA: Diagnosis not present

## 2018-10-17 DIAGNOSIS — D649 Anemia, unspecified: Secondary | ICD-10-CM | POA: Diagnosis present

## 2018-10-17 DIAGNOSIS — A419 Sepsis, unspecified organism: Principal | ICD-10-CM | POA: Diagnosis present

## 2018-10-17 DIAGNOSIS — R600 Localized edema: Secondary | ICD-10-CM | POA: Diagnosis not present

## 2018-10-17 MED ORDER — SODIUM CHLORIDE 0.9 % IV BOLUS (SEPSIS)
1000.0000 mL | Freq: Once | INTRAVENOUS | Status: AC
  Administered 2018-10-18: 1000 mL via INTRAVENOUS

## 2018-10-17 MED ORDER — SODIUM CHLORIDE 0.9 % IV SOLN
2.0000 g | Freq: Once | INTRAVENOUS | Status: AC
  Administered 2018-10-18: 2 g via INTRAVENOUS
  Filled 2018-10-17: qty 2

## 2018-10-17 NOTE — ED Triage Notes (Signed)
Pt seen on Monday at an Urgent Care for LLE non-healing wounds. Pt had dressing placed at UC and given oral abx. Pt states increased weakness and anuria for greater than 24 hrs. Pt's open L heel wound is white around a beefy red wound bed approximately 1 inch in diameter. Pt denies v/d. Pt febrile for EMS.

## 2018-10-17 NOTE — ED Provider Notes (Signed)
[facility] Emergency Department Provider Note  ____________________________________________   First MD Initiated Contact with Patient 10/17/18 2334     (approximate)  I have reviewed the triage vital signs and the nursing notes.   HISTORY  Chief Complaint Weakness  Level 5 caveat:  history/ROS limited by acute/critical illness  HPI [name] is a 53 y.o. female with medical history as listed below which includes poorly controlled diabetes, schizoaffective disorder, nonspecific neuromuscular disorder, chronic diabetic neuropathy/polyneuropathy, etc.  She presents for evaluation tonight of worsening wounds and swelling of the left leg as well as fever at home and no urine production in 24 hours.  She reports that she just discovered that she has wounds on both of her heels and then a wound on the front part of her left lower leg about a week ago.  She went to the urgent care and was put on Keflex but in spite of the Keflex she has been getting worse.  She has redness through her left foot and most of the left lower leg.  She has chronic neuropathy so she does not feel much pain but the leg feels warm and she is having some discharge from the shin wound.  The wounds on her heels seem to be getting worse as well.  She does not report any trauma.  She had a fever at home by EMS of 100.8.  She has been isolating at home and has not been in contact with anyone except for her sister who brings her groceries.  She has had no known contact with anyone with coronavirus/COVID-19.  She denies cough, sore throat, loss of smell or taste, chest pain, shortness of breath, vomiting, and abdominal pain.  She is nauseated currently and has had nausea over the last day.  She reports that usually she goes to the bathroom a lot but has had no urine production since yesterday.  She feels generalized weakness and fatigue and malaise but is not having any chills.  She reports that  her symptoms are severe, have gradually worsened, nothing in particular makes them better nor worse.        Past Medical History:  Diagnosis Date  . Depression   . Hyperlipidemia   . Hypertension   . Neuromuscular disorder ([place])   . Schizo affective schizophrenia ([place])    abilify and pazil    Patient Active Problem List   Diagnosis Date Noted  . Pyelonephritis 12/14/2017  . Morbid obesity ([place]) 05/10/2017  . Long-term use of high-risk medication 05/10/2017  . Hyperlipidemia associated with type 2 diabetes mellitus ([place]) 06/27/2016  . Hammer toe of second toe of right foot 04/20/2016  . Essential hypertension 06/29/2015  . Schizoaffective disorder ([place]) 06/29/2015  . DM (diabetes mellitus), type 2, 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[place]) 34/19/3790  . Polyneuropathy 04/02/2014    Past Surgical History:  Procedure Laterality Date  . CHOLECYSTECTOMY    . IRRIGATION AND DEBRIDEMENT FOOT Right 02/26/2016   Procedure: RIGHT 2ND TOE DEBRIDEMENT;  Surgeon: Samara Deist, DPM;  Location: [facility] ORS;  Service: Podiatry;  Laterality: Right;    Prior to Admission medications   Medication Sig Start Date End Date Taking? Authorizing Provider  ARIPiprazole (ABILIFY) 10 MG tablet Take 10 mg by mouth daily.   Yes [provider]  Insulin Glargine (LANTUS SOLOSTAR) 100 UNIT/ML Solostar Pen Inject 50 Units into the skin daily. Adjust dose as advised Patient taking differently: Inject 52 Units into the skin daily.  12/27/17  Yes Karamalegos, [name] J, DO  lisinopril (PRINIVIL,ZESTRIL) 20 MG tablet TAKE 1 TABLET(20 MG) BY MOUTH DAILY Patient taking differently: Take 20 mg by mouth daily.  05/30/18  Yes Karamalegos, Devonne Doughty, DO  PARoxetine (PAXIL) 20 MG tablet Take 1 tablet (20 mg total) by mouth daily. Prescribed by [facility]   Yes Parks Ranger, Devonne Doughty, DO  acetaminophen (TYLENOL) 325 MG tablet Take 2 tablets (650 mg total) by mouth every 6 (six) hours as needed for  mild pain (or Fever >/= 101). 12/16/17   Nicholes Mango, MD  atorvastatin (LIPITOR) 40 MG tablet Take 1 tablet (40 mg total) by mouth at bedtime. Patient not taking: Reported on 10/18/2018 01/03/18   [name], DO  B-D UF III MINI PEN NEEDLES 31G X 5 MM MISC USE AS DIRECTED 08/07/18   Karamalegos, Devonne Doughty, DO  Blood Glucose Monitoring Suppl (ONE TOUCH ULTRA SYSTEM KIT) w/Device KIT 1 kit by Does not apply route once. 08/20/15   Arlis Porta., MD  gabapentin (NEURONTIN) 100 MG capsule Take 1 capsule (100 mg total) by mouth 3 (three) times daily. SEE NOTES Patient not taking: Reported on 10/18/2018 01/23/18   [name], DO  HUMALOG KWIKPEN 100 UNIT/ML KwikPen Inject 0.05 mLs (5 Units total) into the skin 3 (three) times daily with meals. + Sliding scale Patient not taking: Reported on 10/18/2018 03/23/18   [name], DO  metFORMIN (GLUCOPHAGE) 1000 MG tablet TAKE 1 TABLET(1000 MG) BY MOUTH TWICE DAILY WITH A MEAL 06/25/18   Karamalegos, Devonne Doughty, DO  ONETOUCH VERIO test strip Check blood sugar up to 3 x daily as needed 01/03/18   [name], DO    Allergies Patient has no known allergies.  Family History  Problem Relation Age of Onset  . Breast cancer Cousin        maternal cousin  . Hypertension Mother   . Hyperthyroidism Mother   . Diabetes Maternal Grandmother   . Hypertension Maternal Grandmother   . Cancer Maternal Grandmother        unknown type  . Diabetes Maternal Grandfather   . Hypertension Maternal Grandfather   . Diabetes Paternal Grandmother   . Hypertension Paternal Grandmother   . Diabetes Paternal Grandfather   . Hypertension Paternal Grandfather     Social History Social History   Tobacco Use  . Smoking status: Former Smoker    Packs/day: 3.00    Years: 30.00    Pack years: 90.00    Types: Cigarettes    Quit date: 05/02/2009    Years since quitting: 9.4  . Smokeless tobacco: Former Systems developer  . Tobacco  comment: Pt reported  quitting in 2011  Substance Use Topics  . Alcohol use: No  . Drug use: No    Review of Systems Constitutional: Fever at home with T-max of 100.8.  General malaise and generalized weakness. Eyes: No visual changes. ENT: No sore throat. Cardiovascular: Denies chest pain. Respiratory: Denies shortness of breath.  Denies cough. Gastrointestinal: No abdominal pain.  Nausea, no vomiting.  No diarrhea.  No constipation. Genitourinary: Negative for dysuria. Musculoskeletal: Negative for neck pain.  Negative for back pain. Integumentary: Bilateral heel wounds and wound and redness to the left anterior lower leg. Neurological: Negative for headaches, focal weakness or numbness.   ____________________________________________   PHYSICAL EXAM:  VITAL SIGNS: ED Triage Vitals  Enc Vitals Group     BP 10/17/18 2338 130/64     Pulse Rate 10/17/18 2338 [name])  120     Resp 10/17/18 2338 18     Temp 10/17/18 2338 99.2 F (37.3 C)     Temp Source 10/17/18 2338 Oral     SpO2 10/17/18 2338 96 %     Weight --      Height --      Head Circumference --      Peak Flow --      Pain Score 10/17/18 2339 5     Pain Loc --      Pain Edu? --      Excl. in [place]? --     Constitutional: Alert and oriented.  Appears chronically ill and acutely uncomfortable but not in severe distress. Eyes: Conjunctivae are normal.  Head: Atraumatic. Nose: No congestion/rhinnorhea. Mouth/Throat: Mucous membranes are moist. Neck: No stridor.  No meningeal signs.   Cardiovascular: Tachycardia, regular rhythm. Good peripheral circulation. Grossly normal heart sounds. Respiratory: Normal respiratory effort.  No retractions. No audible wheezing. Gastrointestinal: Soft and nontender. No distention.  Musculoskeletal: Trace pitting edema of the left lower extremity.  See skin exam below for more details. Neurologic:  Normal speech and language. No gross focal neurologic deficits are appreciated.  Skin: The  patient has severe wounds to bilateral heels that appear to have infection that has spread subcutaneously into the body of the foot.  These do not appear acute but do appear acutely infected.  They are also caked with what appears to be dog feces.  She also has an area of erythema and an open skin wound on the inferior anterior left lower extremity consistent with a mild superficial wound with surrounding cellulitis.  The cellulitis extends about halfway up the left lower leg and includes the foot.     ____________________________________________   LABS (all labs ordered are listed, but only abnormal results are displayed)  Labs Reviewed  COMPREHENSIVE METABOLIC PANEL - Abnormal; Notable for the following components:      Result Value   Sodium 133 (*)    Chloride 97 (*)    Glucose, Bld 298 (*)    BUN 33 (*)    Creatinine, Ser 3.09 (*)    Albumin 3.4 (*)    GFR calc non Af Amer 16 (*)    GFR calc Af Amer 19 (*)    All other components within normal limits  CBC WITH DIFFERENTIAL/PLATELET - Abnormal; Notable for the following components:   WBC 12.8 (*)    Hemoglobin 10.3 (*)    HCT 32.9 (*)    RDW 16.4 (*)    Neutro Abs 8.3 (*)    Monocytes Absolute 1.1 (*)    All other components within normal limits  URINALYSIS, COMPLETE (UACMP) WITH MICROSCOPIC - Abnormal; Notable for the following components:   Color, Urine AMBER (*)    APPearance CLOUDY (*)    Glucose, UA >=500 (*)    Hgb urine dipstick SMALL (*)    Protein, ur 30 (*)    Bacteria, UA RARE (*)    All other components within normal limits  SARS CORONAVIRUS 2 (HOSPITAL ORDER, [place] [facility])  CULTURE, BLOOD (ROUTINE X 2)  CULTURE, BLOOD (ROUTINE X 2)  URINE CULTURE  LACTIC ACID, PLASMA  LIPASE, BLOOD  BRAIN NATRIURETIC PEPTIDE  PROTIME-INR  PROCALCITONIN   ____________________________________________  EKG  ED ECG REPORT I, Hinda Kehr, the attending physician, personally viewed and  interpreted this ECG.  Date: 10/17/2018 EKG Time: 23:27 Rate: 122 Rhythm: normal sinus rhythm QRS Axis: normal  Intervals: normal ST/T Wave abnormalities: Non-specific ST segment / T-wave changes, but no clear evidence of acute ischemia. Narrative Interpretation: no definitive evidence of acute ischemia; does not meet STEMI criteria.   ____________________________________________  RADIOLOGY I, Hinda Kehr, personally viewed and evaluated these images (plain radiographs) as part of my medical decision making, as well as reviewing the written report by the radiologist.  ED MD interpretation: Right base atelectasis on chest x-ray.  No evidence of osteomyelitis on either foot x-ray.  Official radiology report(s): Dg Chest Port 1 View  Result Date: 10/18/2018 CLINICAL DATA:  Sepsis EXAM: PORTABLE CHEST 1 VIEW COMPARISON:  12/13/2017 FINDINGS: Linear areas of atelectasis at the right lung base. No focal opacity on the left. Heart is upper limits normal in size. No effusions or acute bony abnormality. IMPRESSION: Right base atelectasis.  No active disease. Electronically Signed   By: Rolm Baptise M.D.   On: 10/18/2018 00:41   Dg Foot Complete Left  Result Date: 10/18/2018 CLINICAL DATA:  Bilateral deep heel wounds. Rule out osteomyelitis. EXAM: LEFT FOOT - COMPLETE 3+ VIEW COMPARISON:  None. FINDINGS: Soft tissue defect about the plantar aspect of the heel. No radiopaque foreign body, tiny density in the subcutaneous tissues felt to be a soft tissue calcification. No tracking soft tissue air. No associated periosteal reaction or bony destructive change of the subjacent calcaneus. Prominent Achilles tendon enthesophyte and small plantar calcaneal spur. No fracture or dislocation. Mild hammertoe deformity of the digits. Mild dorsal soft tissue edema. IMPRESSION: Soft tissue defect about the plantar aspect of the heel consistent with wound. No radiographic findings of osteomyelitis. Electronically  Signed   By: Keith Rake M.D.   On: 10/18/2018 00:44   Dg Foot Complete Right  Result Date: 10/18/2018 CLINICAL DATA:  Bilateral deep heel wounds. Rule out osteomyelitis. EXAM: RIGHT FOOT COMPLETE - 3+ VIEW COMPARISON:  Radiograph 02/24/2016 FINDINGS: Posterior soft tissue defect about the heel. No radiopaque foreign body or tracking soft tissue air. No periosteal reaction or bony destructive change of the subjacent calcaneus. Prominent Achilles tendon enthesophyte with Achilles tendon calcifications. Prominent plantar calcaneal spur. Progressive hammertoe deformity of the second and fifth digits since prior exam. No fracture or dislocation. Mild generalized dorsal soft tissue edema. There are vascular calcifications. IMPRESSION: Posterior soft tissue defect about the heel. No radiographic findings of osteomyelitis. Electronically Signed   By: Keith Rake M.D.   On: 10/18/2018 00:46    ____________________________________________   PROCEDURES   Procedure(s) performed (including Critical Care):  .Critical Care Performed by: Hinda Kehr, MD Authorized by: Hinda Kehr, MD   Critical care provider statement:    Critical care time (minutes):  45   Critical care time was exclusive of:  Separately billable procedures and treating other patients   Critical care was necessary to treat or prevent imminent or life-threatening deterioration of the following conditions:  Sepsis and dehydration   Critical care was time spent personally by me on the following activities:  Development of treatment plan with patient or surrogate, discussions with consultants, evaluation of patient's response to treatment, examination of patient, obtaining history from patient or surrogate, ordering and performing treatments and interventions, ordering and review of laboratory studies, ordering and review of radiographic studies, pulse oximetry, re-evaluation of patient's condition and review of old charts      ____________________________________________   [place] / MDM / [place] / ED COURSE  As part of my medical decision making, I reviewed the following data within the  electronic MEDICAL RECORD NUMBER Nursing notes reviewed and incorporated, Labs reviewed , EKG interpreted , Old chart reviewed, Radiograph reviewed , Discussed with admitting physician  and Notes from prior ED visits      *OYINDAMOLA KEY was evaluated in Emergency Department on 10/18/2018 for the symptoms described in the history of present illness. She was evaluated in the context of the global COVID-19 pandemic, which necessitated consideration that the patient might be at risk for infection with the SARS-CoV-2 virus that causes COVID-19. Institutional protocols and algorithms that pertain to the evaluation of patients at risk for COVID-19 are in a state of rapid change based on information released by regulatory bodies including the CDC and federal and state organizations. These policies and algorithms were followed during the patient's care in the ED.  Some ED evaluations and interventions may be delayed as a result of limited staffing during the pandemic.*  Differential diagnosis includes, but is not limited to, sepsis, cellulitis, osteomyelitis, pneumonia, UTI.  Less likely COVID-19.  I have initiated code sepsis protocol.  There is no sign of severe sepsis or septic shock and the patient's blood pressure is stable but she is tachycardic in the 120s and is reporting anuria since yesterday.  I will start with 1 L of fluid but then most likely order at least one more once her labs start coming back.  X-rays of chest and bilateral feet are pending given the concern for osteomyelitis.  Antibiotic coverage I have ordered include cefepime 2 g IV and vancomycin per pharmacy protocol (2 g IV based on her weight) given the need for broad coverage given her sepsis and contaminated acute on chronic wounds.  She is in no  respiratory distress at this time.  Given the fact that she is meeting septic criteria and has had a fever I will check a rapid COVID-19 test but I suspect that her infection and sepsis as a result of her bilateral heel wounds and her left lower extremity cellulitis.  If she is truly anuric since yesterday I suspect she will be in renal failure and I will start with 1 L normal saline IV and anticipate adding another bolus.  No respiratory distress, protecting airway.  Clinical Course as of Oct 18 134  Thu Oct 18, 2018  0045 Right lung base atelectasis  DG Chest [facility] [CF]  [phone] Mild leukocytosis  CBC WITH DIFFERENTIAL(!) [CF]  9604 No evidence of osteomyelitis on either foot   [CF]  0101 Lactic Acid, Venous: 1.3 [CF]  0110 Patient is in acute renal failure and anuric.  Ordering second liter of normal saline.  Comprehensive metabolic panel(!) [CF]  5409 B Natriuretic Peptide: 21.0 [CF]  0125 Patient reports a height of 5' 10", giving her a BMI of about 40.     [CF]  8119 Will contact hospitalist for admission now that coronavirus test came back negative.  Patient is stable.  SARS Coronavirus 2: NEGATIVE [CF]  0135 Sent [facility] secure text to hospitalists   [CF]  0135 Dr. Sidney Ace acknowledged the admission through secure text.   [CF]    Clinical Course User Index [CF] Hinda Kehr, MD     ____________________________________________  FINAL CLINICAL IMPRESSION(S) / ED DIAGNOSES  Final diagnoses:  Sepsis with acute renal failure without septic shock, due to unspecified organism, unspecified acute renal failure type ([place])  Cellulitis of left anterior lower leg  Diabetic ulcer of left heel associated with type 2 diabetes mellitus, unspecified ulcer stage ([place])  Diabetic ulcer of right heel associated with type 2 diabetes mellitus, unspecified ulcer stage ([place])     MEDICATIONS GIVEN DURING THIS VISIT:  Medications  sodium chloride 0.9 % bolus 1,000 mL (1,000 mLs Intravenous New  Bag/Given 10/18/18 0111)  ceFEPIme (MAXIPIME) 2 g in sodium chloride 0.9 % 100 mL IVPB (2 g Intravenous New Bag/Given 10/18/18 0119)  vancomycin (VANCOCIN) 2,000 mg in sodium chloride 0.9 % 500 mL IVPB (2,000 mg Intravenous New Bag/Given 10/18/18 0112)  sodium chloride 0.9 % bolus 1,000 mL (1,000 mLs Intravenous New Bag/Given 10/18/18 0130)  ondansetron (ZOFRAN) injection 4 mg (4 mg Intravenous Given 10/18/18 0118)     ED Discharge Orders    None       Note:  This document was prepared using Dragon voice recognition software and may include unintentional dictation errors.   Hinda Kehr, MD 10/18/18 [phone]

## 2018-10-18 ENCOUNTER — Inpatient Hospital Stay: Payer: Medicare Other

## 2018-10-18 ENCOUNTER — Other Ambulatory Visit: Payer: Self-pay

## 2018-10-18 ENCOUNTER — Telehealth: Payer: Self-pay

## 2018-10-18 ENCOUNTER — Emergency Department: Payer: Medicare Other

## 2018-10-18 ENCOUNTER — Encounter: Payer: Self-pay | Admitting: Radiology

## 2018-10-18 DIAGNOSIS — E11621 Type 2 diabetes mellitus with foot ulcer: Secondary | ICD-10-CM | POA: Diagnosis present

## 2018-10-18 DIAGNOSIS — L97419 Non-pressure chronic ulcer of right heel and midfoot with unspecified severity: Secondary | ICD-10-CM | POA: Diagnosis present

## 2018-10-18 DIAGNOSIS — I10 Essential (primary) hypertension: Secondary | ICD-10-CM | POA: Diagnosis present

## 2018-10-18 DIAGNOSIS — N179 Acute kidney failure, unspecified: Secondary | ICD-10-CM | POA: Diagnosis present

## 2018-10-18 DIAGNOSIS — E785 Hyperlipidemia, unspecified: Secondary | ICD-10-CM | POA: Diagnosis present

## 2018-10-18 DIAGNOSIS — L97429 Non-pressure chronic ulcer of left heel and midfoot with unspecified severity: Secondary | ICD-10-CM | POA: Diagnosis present

## 2018-10-18 DIAGNOSIS — F329 Major depressive disorder, single episode, unspecified: Secondary | ICD-10-CM | POA: Diagnosis present

## 2018-10-18 DIAGNOSIS — L03116 Cellulitis of left lower limb: Secondary | ICD-10-CM | POA: Diagnosis present

## 2018-10-18 DIAGNOSIS — Z1159 Encounter for screening for other viral diseases: Secondary | ICD-10-CM | POA: Diagnosis not present

## 2018-10-18 DIAGNOSIS — Z794 Long term (current) use of insulin: Secondary | ICD-10-CM | POA: Diagnosis not present

## 2018-10-18 DIAGNOSIS — F259 Schizoaffective disorder, unspecified: Secondary | ICD-10-CM | POA: Diagnosis present

## 2018-10-18 DIAGNOSIS — R6520 Severe sepsis without septic shock: Secondary | ICD-10-CM | POA: Diagnosis present

## 2018-10-18 DIAGNOSIS — E1142 Type 2 diabetes mellitus with diabetic polyneuropathy: Secondary | ICD-10-CM | POA: Diagnosis present

## 2018-10-18 DIAGNOSIS — Z79899 Other long term (current) drug therapy: Secondary | ICD-10-CM | POA: Diagnosis not present

## 2018-10-18 DIAGNOSIS — Z87891 Personal history of nicotine dependence: Secondary | ICD-10-CM | POA: Diagnosis not present

## 2018-10-18 DIAGNOSIS — D649 Anemia, unspecified: Secondary | ICD-10-CM | POA: Diagnosis present

## 2018-10-18 DIAGNOSIS — G709 Myoneural disorder, unspecified: Secondary | ICD-10-CM | POA: Diagnosis present

## 2018-10-18 DIAGNOSIS — A419 Sepsis, unspecified organism: Secondary | ICD-10-CM | POA: Diagnosis present

## 2018-10-18 DIAGNOSIS — E860 Dehydration: Secondary | ICD-10-CM | POA: Diagnosis present

## 2018-10-18 DIAGNOSIS — E875 Hyperkalemia: Secondary | ICD-10-CM | POA: Diagnosis not present

## 2018-10-18 LAB — BASIC METABOLIC PANEL
Anion gap: 9 (ref 5–15)
BUN: 30 mg/dL — ABNORMAL HIGH (ref 6–20)
CO2: 21 mmol/L — ABNORMAL LOW (ref 22–32)
Calcium: 8.4 mg/dL — ABNORMAL LOW (ref 8.9–10.3)
Chloride: 103 mmol/L (ref 98–111)
Creatinine, Ser: 2.48 mg/dL — ABNORMAL HIGH (ref 0.44–1.00)
GFR calc Af Amer: 25 mL/min — ABNORMAL LOW (ref >60)
GFR calc non Af Amer: 21 mL/min — ABNORMAL LOW (ref >60)
Glucose, Bld: 277 mg/dL — ABNORMAL HIGH (ref 70–99)
Potassium: 4.9 mmol/L (ref 3.5–5.1)
Sodium: 133 mmol/L — ABNORMAL LOW (ref 135–145)

## 2018-10-18 LAB — COMPREHENSIVE METABOLIC PANEL
ALT: 18 U/L (ref 0–44)
AST: 15 U/L (ref 15–41)
Albumin: 3.4 g/dL — ABNORMAL LOW (ref 3.5–5.0)
Alkaline Phosphatase: 105 U/L (ref 38–126)
Anion gap: 13 (ref 5–15)
BUN: 33 mg/dL — ABNORMAL HIGH (ref 6–20)
CO2: 23 mmol/L (ref 22–32)
Calcium: 9 mg/dL (ref 8.9–10.3)
Chloride: 97 mmol/L — ABNORMAL LOW (ref 98–111)
Creatinine, Ser: 3.09 mg/dL — ABNORMAL HIGH (ref 0.44–1.00)
GFR calc Af Amer: 19 mL/min — ABNORMAL LOW (ref >60)
GFR calc non Af Amer: 16 mL/min — ABNORMAL LOW (ref >60)
Glucose, Bld: 298 mg/dL — ABNORMAL HIGH (ref 70–99)
Potassium: 5.1 mmol/L (ref 3.5–5.1)
Sodium: 133 mmol/L — ABNORMAL LOW (ref 135–145)
Total Bilirubin: 0.7 mg/dL (ref 0.3–1.2)
Total Protein: 7.3 g/dL (ref 6.5–8.1)

## 2018-10-18 LAB — URINALYSIS, COMPLETE (UACMP) WITH MICROSCOPIC
Bilirubin Urine: NEGATIVE
Glucose, UA: >=500 mg/dL — AB
Ketones, ur: NEGATIVE mg/dL
Leukocytes,Ua: NEGATIVE
Nitrite: NEGATIVE
Protein, ur: 30 mg/dL — AB
Specific Gravity, Urine: 1.018 (ref 1.005–1.030)
pH: 5 (ref 5.0–8.0)

## 2018-10-18 LAB — CBC
HCT: 30.3 % — ABNORMAL LOW (ref 36.0–46.0)
Hemoglobin: 9.4 g/dL — ABNORMAL LOW (ref 12.0–15.0)
MCH: 26.1 pg (ref 26.0–34.0)
MCHC: 31 g/dL (ref 30.0–36.0)
MCV: 84.2 fL (ref 80.0–100.0)
Platelets: 264 10*3/uL (ref 150–400)
RBC: 3.6 MIL/uL — ABNORMAL LOW (ref 3.87–5.11)
RDW: 16.4 % — ABNORMAL HIGH (ref 11.5–15.5)
WBC: 11.1 10*3/uL — ABNORMAL HIGH (ref 4.0–10.5)
nRBC: 0 % (ref 0.0–0.2)

## 2018-10-18 LAB — LIPASE, BLOOD: Lipase: 26 U/L (ref 11–51)

## 2018-10-18 LAB — CBC WITH DIFFERENTIAL/PLATELET
Abs Immature Granulocytes: 0.07 10*3/uL (ref 0.00–0.07)
Basophils Absolute: 0.1 10*3/uL (ref 0.0–0.1)
Basophils Relative: 1 %
Eosinophils Absolute: 0.2 10*3/uL (ref 0.0–0.5)
Eosinophils Relative: 2 %
HCT: 32.9 % — ABNORMAL LOW (ref 36.0–46.0)
Hemoglobin: 10.3 g/dL — ABNORMAL LOW (ref 12.0–15.0)
Immature Granulocytes: 1 %
Lymphocytes Relative: 23 %
Lymphs Abs: 3 10*3/uL (ref 0.7–4.0)
MCH: 26.2 pg (ref 26.0–34.0)
MCHC: 31.3 g/dL (ref 30.0–36.0)
MCV: 83.7 fL (ref 80.0–100.0)
Monocytes Absolute: 1.1 10*3/uL — ABNORMAL HIGH (ref 0.1–1.0)
Monocytes Relative: 9 %
Neutro Abs: 8.3 10*3/uL — ABNORMAL HIGH (ref 1.7–7.7)
Neutrophils Relative %: 64 %
Platelets: 291 10*3/uL (ref 150–400)
RBC: 3.93 MIL/uL (ref 3.87–5.11)
RDW: 16.4 % — ABNORMAL HIGH (ref 11.5–15.5)
WBC: 12.8 10*3/uL — ABNORMAL HIGH (ref 4.0–10.5)
nRBC: 0 % (ref 0.0–0.2)

## 2018-10-18 LAB — PROTIME-INR
INR: 1.1 (ref 0.8–1.2)
Prothrombin Time: 13.7 seconds (ref 11.4–15.2)

## 2018-10-18 LAB — LACTIC ACID, PLASMA: Lactic Acid, Venous: 1.3 mmol/L (ref 0.5–1.9)

## 2018-10-18 LAB — VANCOMYCIN, RANDOM: Vancomycin Rm: 20

## 2018-10-18 LAB — HEMOGLOBIN A1C
Hgb A1c MFr Bld: 12.9 % — ABNORMAL HIGH (ref 4.8–5.6)
Mean Plasma Glucose: 323.53 mg/dL

## 2018-10-18 LAB — SARS CORONAVIRUS 2 BY RT PCR (HOSPITAL ORDER, PERFORMED IN ~~LOC~~ HOSPITAL LAB): SARS Coronavirus 2: NEGATIVE

## 2018-10-18 LAB — BRAIN NATRIURETIC PEPTIDE: B Natriuretic Peptide: 21 pg/mL (ref 0.0–100.0)

## 2018-10-18 LAB — PROCALCITONIN: Procalcitonin: 0.16 ng/mL

## 2018-10-18 LAB — GLUCOSE, CAPILLARY
Glucose-Capillary: 253 mg/dL — ABNORMAL HIGH (ref 70–99)
Glucose-Capillary: 304 mg/dL — ABNORMAL HIGH (ref 70–99)
Glucose-Capillary: 249 mg/dL — ABNORMAL HIGH (ref 70–99)
Glucose-Capillary: 249 mg/dL — ABNORMAL HIGH (ref 70–99)

## 2018-10-18 MED ORDER — SODIUM CHLORIDE 0.9 % IV SOLN
2.0000 g | Freq: Two times a day (BID) | INTRAVENOUS | Status: DC
  Filled 2018-10-18 (×2): qty 2

## 2018-10-18 MED ORDER — VANCOMYCIN HCL 1.25 G IV SOLR
1250.0000 mg | Freq: Once | INTRAVENOUS | Status: AC
  Administered 2018-10-18: 23:00:00 1250 mg via INTRAVENOUS
  Filled 2018-10-18: qty 1250

## 2018-10-18 MED ORDER — LISINOPRIL 10 MG PO TABS: 20.0000 mg | ORAL_TABLET | Freq: Every day | ORAL | Status: DC

## 2018-10-18 MED ORDER — INSULIN GLARGINE 100 UNIT/ML ~~LOC~~ SOLN
40.0000 [IU] | Freq: Every day | SUBCUTANEOUS | Status: DC
  Administered 2018-10-18 – 2018-10-20 (×3): 40 [IU] via SUBCUTANEOUS
  Filled 2018-10-18 (×3): qty 0.4

## 2018-10-18 MED ORDER — INSULIN ASPART 100 UNIT/ML ~~LOC~~ SOLN
0.0000 [IU] | Freq: Three times a day (TID) | SUBCUTANEOUS | Status: DC
  Administered 2018-10-18: 12:00:00 11 [IU] via SUBCUTANEOUS
  Administered 2018-10-18: 08:00:00 8 [IU] via SUBCUTANEOUS
  Administered 2018-10-18: 5 [IU] via SUBCUTANEOUS
  Filled 2018-10-18 (×3): qty 1

## 2018-10-18 MED ORDER — ONDANSETRON HCL 4 MG/2ML IJ SOLN: 4.0000 mg | Freq: Four times a day (QID) | INTRAMUSCULAR | Status: DC | PRN

## 2018-10-18 MED ORDER — GABAPENTIN 100 MG PO CAPS: 100.0000 mg | ORAL_CAPSULE | Freq: Three times a day (TID) | ORAL | Status: DC

## 2018-10-18 MED ORDER — PAROXETINE HCL 20 MG PO TABS
20.0000 mg | ORAL_TABLET | Freq: Every day | ORAL | Status: DC
  Administered 2018-10-18 – 2018-10-21 (×4): 20 mg via ORAL
  Filled 2018-10-18 (×4): qty 1

## 2018-10-18 MED ORDER — ARIPIPRAZOLE 10 MG PO TABS
10.0000 mg | ORAL_TABLET | Freq: Every day | ORAL | Status: DC
  Administered 2018-10-18 – 2018-10-21 (×4): 10 mg via ORAL
  Filled 2018-10-18 (×5): qty 1

## 2018-10-18 MED ORDER — PIPERACILLIN-TAZOBACTAM 3.375 G IVPB
3.3750 g | Freq: Three times a day (TID) | INTRAVENOUS | Status: DC
  Administered 2018-10-18 – 2018-10-21 (×9): 3.375 g via INTRAVENOUS
  Filled 2018-10-18 (×9): qty 50

## 2018-10-18 MED ORDER — ONDANSETRON HCL 4 MG/2ML IJ SOLN
4.0000 mg | INTRAMUSCULAR | Status: AC
  Administered 2018-10-18: 4 mg via INTRAVENOUS
  Filled 2018-10-18: qty 2

## 2018-10-18 MED ORDER — SODIUM CHLORIDE 0.9 % IV SOLN: 2.0000 g | INTRAVENOUS | Status: DC

## 2018-10-18 MED ORDER — VANCOMYCIN HCL 10 G IV SOLR
2000.0000 mg | Freq: Once | INTRAVENOUS | Status: AC
  Administered 2018-10-18: 2000 mg via INTRAVENOUS
  Filled 2018-10-18: qty 2000

## 2018-10-18 MED ORDER — ACETAMINOPHEN 650 MG RE SUPP: 650.0000 mg | Freq: Four times a day (QID) | RECTAL | Status: DC | PRN

## 2018-10-18 MED ORDER — INSULIN ASPART 100 UNIT/ML ~~LOC~~ SOLN: 0.0000 [IU] | Freq: Three times a day (TID) | SUBCUTANEOUS | Status: DC

## 2018-10-18 MED ORDER — ACETAMINOPHEN 325 MG PO TABS: 650.0000 mg | ORAL_TABLET | Freq: Four times a day (QID) | ORAL | Status: DC | PRN

## 2018-10-18 MED ORDER — ATORVASTATIN CALCIUM 20 MG PO TABS
40.0000 mg | ORAL_TABLET | Freq: Every day | ORAL | Status: DC
  Administered 2018-10-18 – 2018-10-20 (×3): 40 mg via ORAL
  Filled 2018-10-18 (×3): qty 2

## 2018-10-18 MED ORDER — ENOXAPARIN SODIUM 40 MG/0.4ML ~~LOC~~ SOLN
40.0000 mg | SUBCUTANEOUS | Status: DC
  Administered 2018-10-18 – 2018-10-21 (×4): 40 mg via SUBCUTANEOUS
  Filled 2018-10-18 (×5): qty 0.4

## 2018-10-18 MED ORDER — SODIUM CHLORIDE 0.9 % IV SOLN
INTRAVENOUS | Status: DC
  Administered 2018-10-18 – 2018-10-19 (×4): via INTRAVENOUS

## 2018-10-18 MED ORDER — MAGNESIUM HYDROXIDE 400 MG/5ML PO SUSP
30.0000 mL | Freq: Every day | ORAL | Status: DC | PRN
  Filled 2018-10-18: qty 30

## 2018-10-18 MED ORDER — INSULIN ASPART 100 UNIT/ML ~~LOC~~ SOLN
0.0000 [IU] | Freq: Three times a day (TID) | SUBCUTANEOUS | Status: DC
  Administered 2018-10-19 (×2): 5 [IU] via SUBCUTANEOUS
  Administered 2018-10-19: 11 [IU] via SUBCUTANEOUS
  Administered 2018-10-19 (×2): 5 [IU] via SUBCUTANEOUS
  Administered 2018-10-20: 22:00:00 8 [IU] via SUBCUTANEOUS
  Administered 2018-10-20: 08:00:00 3 [IU] via SUBCUTANEOUS
  Administered 2018-10-20: 12:00:00 5 [IU] via SUBCUTANEOUS
  Administered 2018-10-20: 17:00:00 11 [IU] via SUBCUTANEOUS
  Administered 2018-10-21: 5 [IU] via SUBCUTANEOUS
  Administered 2018-10-21: 12:00:00 8 [IU] via SUBCUTANEOUS
  Filled 2018-10-18 (×11): qty 1

## 2018-10-18 MED ORDER — TRAZODONE HCL 50 MG PO TABS: 25.0000 mg | ORAL_TABLET | Freq: Every evening | ORAL | Status: DC | PRN

## 2018-10-18 MED ORDER — SODIUM CHLORIDE 0.9 % IV SOLN: 2.0000 g | Freq: Three times a day (TID) | INTRAVENOUS | Status: DC

## 2018-10-18 MED ORDER — ACETAMINOPHEN 325 MG PO TABS
650.0000 mg | ORAL_TABLET | Freq: Four times a day (QID) | ORAL | Status: DC | PRN
  Administered 2018-10-18 (×3): 650 mg via ORAL
  Filled 2018-10-18 (×3): qty 2

## 2018-10-18 MED ORDER — ONDANSETRON HCL 4 MG PO TABS
4.0000 mg | ORAL_TABLET | Freq: Four times a day (QID) | ORAL | Status: DC | PRN
  Administered 2018-10-18 (×2): 4 mg via ORAL
  Filled 2018-10-18 (×2): qty 1

## 2018-10-18 MED ORDER — SODIUM CHLORIDE 0.9 % IV BOLUS (SEPSIS)
1000.0000 mL | Freq: Once | INTRAVENOUS | Status: AC
  Administered 2018-10-18: 1000 mL via INTRAVENOUS

## 2018-10-18 MED ORDER — HYDROCODONE-ACETAMINOPHEN 5-325 MG PO TABS: 1.0000 | ORAL_TABLET | ORAL | Status: DC | PRN

## 2018-10-18 NOTE — Progress Notes (Signed)
   10/18/18 1300  Clinical Encounter Type  Visited With Patient  Visit Type Initial  Referral From Nurse  Spiritual Encounters  Spiritual Needs Prayer  Stress Factors  Patient Stress Factors Health changes  Ch received an OR for AD education. Pt wants to assign her daughter as her POA. Pt will discuss with the daughter and complete the document. Pt requested a prayer on her healing and change of lifestyle that will be beneficial to her diabetes. Pt's faith helps her cope with her current situation as it encourages her to have gratitude. Pt mentioned "I am glad to have come to the hospital. God has given me another day." Ch will follow up per pt's request.

## 2018-10-18 NOTE — Progress Notes (Signed)
Pharmacy Antibiotic Note  [name] is a 53 y.o. female admitted on 10/17/2018 with wound infection of L&RLE w/ c/o weakness found to meet 2/4 SIRs criteria w/ WBC 12.8, PCT 0.16, XRF showing soft tissue swelling w/o osteomyelitis, patient also has a h/o pyelonephritis w/o CKD; however, currently patient is in AKI w/ Scr 3.03 (BL 0.99 - 1.24).  Pharmacy has been consulted for vanc/cefepime dosing. Patient received vanc 2g IV load and cefepime 2g IV x 1.  Plan: Considering patient is in AKI will hold off on vanc AUC dosing and will dose per random levels. Will order a vanc random w/ am labs and if random level < 20 mcg/mL will give a 15 mg/kg dose IV x 1. Will start cefepime 2g IV q12h per [facility] ~30 ml/min. Will continue to monitor patient's renal function and adjust doses as necessary.  Weight: 266 lb 12.1 oz (121 kg)  Temp (24hrs), Avg:99.2 F (37.3 C), Min:99.2 F (37.3 C), Max:99.2 F (37.3 C)  Recent Labs  Lab 10/18/18 0024  WBC 12.8*  CREATININE 3.09*  LATICACIDVEN 1.3    Estimated Creatinine Clearance: 29.6 mL/min (A) (by C-G formula based on SCr of 3.09 mg/dL (H)).    No Known Allergies  Thank you for allowing pharmacy to be a part of this patient's care.  Tobie Lords, PharmD, BCPS Clinical Pharmacist 10/18/2018 3:01 AM

## 2018-10-18 NOTE — Progress Notes (Signed)
Advanced care plan. Purpose of the Encounter: CODE STATUS Parties in Attendance:Patient Patient's Decision Capacity:Good Subjective/Patient's story: [name]  is a 53 y.o. Caucasian female with a known history of multiple medical problems that are mentioned below, who presented to the emergency room with a consult of nausea with generalized weakness and inability to walk with left lack swelling again erythema.  She denied any significant left leg pain.  She has been having worsening wounds of both heels.  She said that she did not urinate since yesterday and for about 24 hours.  She admitted to chills and and fever of 100.8 per EMS.  She denied any vomiting or diarrhea or melena or bright red bleeding per rectum.  No chest pain or dyspnea or palpitations.  No cough or wheezing or hemoptysis.  She was seen in urgent care for her heel wounds and was given Keflex and despite that they have been getting worse as well as her left leg.  She has not been feeling much pain likely due to her chronic diabetic neuropathy.  She stated that she likely wore tight shoes that caused her heel ulcers. When she came to the ER, she was tachycardic with a heart rate of 117 with otherwise normal vital signs.  EKG showed sinus tachycardia with a rate of 122 with inferior T wave inversion.  Labs were remarkable for BUN of 33 and creatinine of 3.09 with a potassium of 5.1 and albumin was 3.4.  Lactic acid was 1.3.  CBC showed leukocytosis of 12.8, anemia and neutrophilia.  Urinalysis showed specific gravity 1018 with glucose more than 506-10 RBCs and 0-5 WBCs.  Blood cultures were drawn as well as urine culture and sensitivity.  Portable chest x-ray showed right basilar atelectasis with no other acute cardiopulmonary disease.  Bilateral feet x-ray showed soft tissue swelling and effective without osteomyelitis. The patient was given IV cefepime and vancomycin as well as 4 mg of IV Zofran and IV normal saline.  She will be  admitted to a medically monitored bed for further evaluation and management. Objective/Medical story Patient needs broad-spectrum antibiotics for heel ulcer and cellulitis.  Needs IV fluids for renal failure.  Needs podiatry consult for bilateral heel ulcers necrotic. Goals of care determination:  Advance care directives goals of care treatment plan discussed Patient wants everything done, it includes CPR, intubation ventilator if the need arises. CODE STATUS: Full code Time spent discussing advanced care planning: 16 minutes

## 2018-10-18 NOTE — Consult Note (Signed)
ORTHOPAEDIC CONSULTATION  REQUESTING PHYSICIAN: Saundra Shelling, MD  Chief Complaint: Bilateral heel ulcerations  HPI: [name] is a 53 y.o. female who complains of bilateral heel ulcerations.  She states she was wearing some tight fitting shoes and she has neuropathy.  She noticed redness to her feet and subsequently was admitted for cellulitis.  Past Medical History:  Diagnosis Date  . Depression   . Hyperlipidemia   . Hypertension   . Neuromuscular disorder ([place])   . Schizo affective schizophrenia ([place])    abilify and pazil   Past Surgical History:  Procedure Laterality Date  . CHOLECYSTECTOMY    . IRRIGATION AND DEBRIDEMENT FOOT Right 02/26/2016   Procedure: RIGHT 2ND TOE DEBRIDEMENT;  Surgeon: Samara Deist, DPM;  Location: [facility] ORS;  Service: Podiatry;  Laterality: Right;   Social History   Socioeconomic History  . Marital status: Divorced    Spouse name: Not on file  . Number of children: 2  . Years of education: Not on file  . Highest education level: Not on file  Occupational History  . Not on file  Social Needs  . Financial resource strain: Not hard at all  . Food insecurity    Worry: Never true    Inability: Never true  . Transportation needs    Medical: No    Non-medical: No  Tobacco Use  . Smoking status: Former Smoker    Packs/day: 3.00    Years: 30.00    Pack years: 90.00    Types: Cigarettes    Quit date: 05/02/2009    Years since quitting: 9.4  . Smokeless tobacco: Former Systems developer  . Tobacco comment: Pt reported  quitting in 2011  Substance and Sexual Activity  . Alcohol use: No  . Drug use: No  . Sexual activity: Not Currently  Lifestyle  . Physical activity    Days per week: 0 days    Minutes per session: 0 min  . Stress: Not at all  Relationships  . Social connections    Talks on phone: More than three times a week    Gets together: More than three times a week    Attends religious service: More than 4 times per year   Active member of club or organization: No    Attends meetings of clubs or organizations: Never    Relationship status: Divorced  Other Topics Concern  . Not on file  Social History Narrative   At home with mom, 76. Lives in brothers house who moved out. Sister comes by every other day to check on patient and mom.   Family History  Problem Relation Age of Onset  . Breast cancer Cousin        maternal cousin  . Hypertension Mother   . Hyperthyroidism Mother   . Diabetes Maternal Grandmother   . Hypertension Maternal Grandmother   . Cancer Maternal Grandmother        unknown type  . Diabetes Maternal Grandfather   . Hypertension Maternal Grandfather   . Diabetes Paternal Grandmother   . Hypertension Paternal Grandmother   . Diabetes Paternal Grandfather   . Hypertension Paternal Grandfather    No Known Allergies Prior to Admission medications   Medication Sig Start Date End Date Taking? Authorizing Provider  ARIPiprazole (ABILIFY) 10 MG tablet Take 10 mg by mouth daily.   Yes [provider]  Insulin Glargine (LANTUS SOLOSTAR) 100 UNIT/ML Solostar Pen Inject 50 Units into the skin daily. Adjust dose as advised Patient taking  differently: Inject 52 Units into the skin daily.  12/27/17  Yes Karamalegos, Devonne Doughty, DO  lisinopril (PRINIVIL,ZESTRIL) 20 MG tablet TAKE 1 TABLET(20 MG) BY MOUTH DAILY Patient taking differently: Take 20 mg by mouth daily.  05/30/18  Yes Karamalegos, Devonne Doughty, DO  metFORMIN (GLUCOPHAGE) 1000 MG tablet TAKE 1 TABLET(1000 MG) BY MOUTH TWICE DAILY WITH A MEAL 06/25/18  Yes Karamalegos, Devonne Doughty, DO  PARoxetine (PAXIL) 20 MG tablet Take 1 tablet (20 mg total) by mouth daily. Prescribed by [facility]   Yes Parks Ranger, Devonne Doughty, DO  acetaminophen (TYLENOL) 325 MG tablet Take 2 tablets (650 mg total) by mouth every 6 (six) hours as needed for mild pain (or Fever >/= 101). 12/16/17   Nicholes Mango, MD  atorvastatin (LIPITOR) 40 MG tablet Take 1  tablet (40 mg total) by mouth at bedtime. Patient not taking: Reported on 10/18/2018 01/03/18   [name], DO  B-D UF III MINI PEN NEEDLES 31G X 5 MM MISC USE AS DIRECTED 08/07/18   Karamalegos, Devonne Doughty, DO  Blood Glucose Monitoring Suppl (ONE TOUCH ULTRA SYSTEM KIT) w/Device KIT 1 kit by Does not apply route once. 08/20/15   Arlis Porta., MD  gabapentin (NEURONTIN) 100 MG capsule Take 1 capsule (100 mg total) by mouth 3 (three) times daily. SEE NOTES Patient not taking: Reported on 10/18/2018 01/23/18   [name], DO  HUMALOG KWIKPEN 100 UNIT/ML KwikPen Inject 0.05 mLs (5 Units total) into the skin 3 (three) times daily with meals. + Sliding scale Patient not taking: Reported on 10/18/2018 03/23/18   [name], DO  [facility] VERIO test strip Check blood sugar up to 3 x daily as needed 01/03/18   [name], DO   US Renal  Result Date: 10/18/2018 CLINICAL DATA:  Acute renal failure EXAM: RENAL / URINARY TRACT ULTRASOUND COMPLETE COMPARISON:  None. FINDINGS: Right Kidney: Renal measurements: 12.7 x 5.4 x 5.7 cm = volume: 204 mL . Echogenicity within normal limits. No mass or hydronephrosis visualized. Left Kidney: Renal measurements: 13.5 x 6.0 x 5.6 cm = volume: 237 mL. Echogenicity within normal limits. No mass or hydronephrosis visualized. Bladder: Appears normal for degree of bladder distention. IMPRESSION: No hydronephrosis. Electronically Signed   By: Lovey Newcomer M.D.   On: 10/18/2018 13:02   Dg Chest Port 1 View  Result Date: 10/18/2018 CLINICAL DATA:  Sepsis EXAM: PORTABLE CHEST 1 VIEW COMPARISON:  12/13/2017 FINDINGS: Linear areas of atelectasis at the right lung base. No focal opacity on the left. Heart is upper limits normal in size. No effusions or acute bony abnormality. IMPRESSION: Right base atelectasis.  No active disease. Electronically Signed   By: Rolm Baptise M.D.   On: 10/18/2018 00:41   Dg Foot Complete Left  Result  Date: 10/18/2018 CLINICAL DATA:  Bilateral deep heel wounds. Rule out osteomyelitis. EXAM: LEFT FOOT - COMPLETE 3+ VIEW COMPARISON:  None. FINDINGS: Soft tissue defect about the plantar aspect of the heel. No radiopaque foreign body, tiny density in the subcutaneous tissues felt to be a soft tissue calcification. No tracking soft tissue air. No associated periosteal reaction or bony destructive change of the subjacent calcaneus. Prominent Achilles tendon enthesophyte and small plantar calcaneal spur. No fracture or dislocation. Mild hammertoe deformity of the digits. Mild dorsal soft tissue edema. IMPRESSION: Soft tissue defect about the plantar aspect of the heel consistent with wound. No radiographic findings of osteomyelitis. Electronically Signed   By: Aurther Loft.D.  On: 10/18/2018 00:44   Dg Foot Complete Right  Result Date: 10/18/2018 CLINICAL DATA:  Bilateral deep heel wounds. Rule out osteomyelitis. EXAM: RIGHT FOOT COMPLETE - 3+ VIEW COMPARISON:  Radiograph 02/24/2016 FINDINGS: Posterior soft tissue defect about the heel. No radiopaque foreign body or tracking soft tissue air. No periosteal reaction or bony destructive change of the subjacent calcaneus. Prominent Achilles tendon enthesophyte with Achilles tendon calcifications. Prominent plantar calcaneal spur. Progressive hammertoe deformity of the second and fifth digits since prior exam. No fracture or dislocation. Mild generalized dorsal soft tissue edema. There are vascular calcifications. IMPRESSION: Posterior soft tissue defect about the heel. No radiographic findings of osteomyelitis. Electronically Signed   By: Keith Rake M.D.   On: 10/18/2018 00:46    Positive ROS: All other systems have been reviewed and were otherwise negative with the exception of those mentioned in the HPI and as above.  12 point ROS was performed.  Physical Exam: General: Alert and oriented.  No apparent distress.  Vascular:  Left foot:Dorsalis  Pedis:  present Posterior Tibial:  diminished  Right foot: Dorsalis Pedis:  present Posterior Tibial:  diminished  Neuro:absent protective sensation  Derm: On the right heel there is a superficial ulceration with overlying hyperkeratotic nonviable skin secondary to blister formation.  The ulceration itself measures approximately 4 x 3 cm but once again this is superficial without signs of infection.  On the left heel there is a smaller ulceration but has definite more depth.  This has a central area of necrotic tissue as well.  This ulceration measures approximately 2 cm in diameter with a depth of approximately a centimeter down to subcutaneous tissue upon debridement.  Ortho/MS: Bilateral edema to the lower extremities.  Assessment: Bilateral heel ulcerations.  Right-sided superficial limited to breakdown of skin left side is full-thickness to subcutaneous tissue  Plan: She does have palpable pulses I do not believe she needs vascular invention at this time.  Right heel debridement superficial in nature was performed with a scissor and pickup.  I was able to remove overlying hyperkeratotic nonviable tissue.  Post debridement measurements was 4 x 3 cm.  Left heel full-thickness debridement performed with pickups and scissors.  This was full-thickness into subcutaneous tissue.  Post debridement measurements were 2 cm in diameter with 1 cm of depth.  No signs of infection to either side.  Dressing changes and orders have been written by the wound care nurse.  Agree with these orders.  Continue with this while in house.  No need for surgical debridement.  There was no obvious abscess or drainable infection that needs to be performed.  Not concerned about osteomyelitis at this time as both of these are superficial enough not to have a deep infection.  I will see her back in the outpatient clinic in 2 to 3 weeks upon discharge.    Elesa Hacker, DPM Cell [phone]   10/18/2018 5:47  PM

## 2018-10-18 NOTE — Consult Note (Signed)
[place] Nurse wound consult note Reason for Consult: bilateral heels and left pretibial wound Wound type: Unclear etiology;trauma (ill fitting shoes) vs neuropathic vs pressure. Patient poor historian Pressure Injury POA: /NA Measurement: Right heel: 3.0cmx 3.5cm x 0.2cm; 50% bulla roof that is soft/50% dark pink tissue Left heel: 2cm x 1.0cm x 0.1cm; 100% black centrally; calleous skin along the distal perimeter  Left pretibial area; 2.5cm x 1.5cm x0cm dry fibrinous ulceration;  Wound bed: see above Drainage (amount, consistency, odor) minimal from the right heel, none from the LLE wounds Periwound: edema, 2+, palpable distal pulses  Dressing procedure/placement/frequency: 1. Single layer of xeroform to the left pretibial wound change every other day 2. Silver hydrofiber to the right heel, top with dry dressing. Change daily.  3. Foam to the left heel, pending possible podiatry consultation.   Patient is ambulatory, will not add Prevalon boots Needs long term follow up for heel wounds, either podietry or with wound care center of her choice.  Requested podiatry consultation while inpatient   Discussed POC with patient and bedside nurse.  Re consult if needed, will not follow at this time. Thanks  Vitaly Wanat [employer], RN,CWOCN, CNS, [place] [phone])

## 2018-10-18 NOTE — H&P (Addendum)
[place] [facility] [place] NAME: [name]    MR#:  [id number]  DATE OF BIRTH:  1966/01/04  DATE OF ADMISSION: 10/18/2018  PRIMARY CARE PHYSICIAN: [name], DO   REQUESTING/REFERRING PHYSICIAN: Hinda Kehr, MD  CHIEF COMPLAINT:   Chief Complaint  Patient presents with  . Weakness    HISTORY OF PRESENT ILLNESS:  [name]  is a 53 y.o. Caucasian female with a known history of multiple medical problems that are mentioned below, who presented to the emergency room with a consult of nausea with generalized weakness and inability to walk with left lack swelling again erythema.  She denied any significant left leg pain.  She has been having worsening wounds of both heels.  She said that she did not urinate since yesterday and for about 24 hours.  She admitted to chills and and fever of 100.8 per EMS.  She denied any vomiting or diarrhea or melena or bright red bleeding per rectum.  No chest pain or dyspnea or palpitations.  No cough or wheezing or hemoptysis.  She was seen in urgent care for her heel wounds and was given Keflex and despite that they have been getting worse as well as her left leg.  She has not been feeling much pain likely due to her chronic diabetic neuropathy.  She stated that she likely wore tight shoes that caused her heel ulcers.  When she came to the ER, she was tachycardic with a heart rate of 117 with otherwise normal vital signs.  EKG showed sinus tachycardia with a rate of 122 with inferior T wave inversion.  Labs were remarkable for BUN of 33 and creatinine of 3.09 with a potassium of 5.1 and albumin was 3.4.  Lactic acid was 1.3.  CBC showed leukocytosis of 12.8, anemia and neutrophilia.  Urinalysis showed specific gravity 1018 with glucose more than 506-10 RBCs and 0-5 WBCs.  Blood cultures were drawn as well as urine culture and sensitivity.  Portable chest x-ray showed right basilar atelectasis  with no other acute cardiopulmonary disease.  Bilateral feet x-ray showed soft tissue swelling and effective without osteomyelitis.  The patient was given IV cefepime and vancomycin as well as 4 mg of IV Zofran and IV normal saline.  She will be admitted to a medically monitored bed for further evaluation and management. PAST MEDICAL HISTORY:   Past Medical History:  Diagnosis Date  . Depression   . Hyperlipidemia   . Hypertension   . Neuromuscular disorder ([place])   . Schizo affective schizophrenia ([place])    abilify and pazil  Peripheral neuropathy Type 2 diabetes mellitus PAST SURGICAL HISTORY:   Past Surgical History:  Procedure Laterality Date  . CHOLECYSTECTOMY    . IRRIGATION AND DEBRIDEMENT FOOT Right 02/26/2016   Procedure: RIGHT 2ND TOE DEBRIDEMENT;  Surgeon: Samara Deist, DPM;  Location: [facility] ORS;  Service: Podiatry;  Laterality: Right;    SOCIAL HISTORY:   Social History   Tobacco Use  . Smoking status: Former Smoker    Packs/day: 3.00    Years: 30.00    Pack years: 90.00    Types: Cigarettes    Quit date: 05/02/2009    Years since quitting: 9.4  . Smokeless tobacco: Former Systems developer  . Tobacco comment: Pt reported  quitting in 2011  Substance Use Topics  . Alcohol use: No    FAMILY HISTORY:   Family History  Problem Relation Age of Onset  . Breast cancer  Cousin        maternal cousin  . Hypertension Mother   . Hyperthyroidism Mother   . Diabetes Maternal Grandmother   . Hypertension Maternal Grandmother   . Cancer Maternal Grandmother        unknown type  . Diabetes Maternal Grandfather   . Hypertension Maternal Grandfather   . Diabetes Paternal Grandmother   . Hypertension Paternal Grandmother   . Diabetes Paternal Grandfather   . Hypertension Paternal Grandfather     DRUG ALLERGIES:  No Known Allergies  REVIEW OF SYSTEMS:   ROS As per history of present illness. All pertinent systems were reviewed above. Constitutional,  HEENT, cardiovascular,  respiratory, GI, GU, musculoskeletal, neuro, psychiatric, endocrine,  integumentary and hematologic systems were reviewed and are otherwise  negative/unremarkable except for positive findings mentioned above in the HPI.   MEDICATIONS AT HOME:   Prior to Admission medications   Medication Sig Start Date End Date Taking? Authorizing Provider  ARIPiprazole (ABILIFY) 10 MG tablet Take 10 mg by mouth daily.   Yes [provider]  Insulin Glargine (LANTUS SOLOSTAR) 100 UNIT/ML Solostar Pen Inject 50 Units into the skin daily. Adjust dose as advised Patient taking differently: Inject 52 Units into the skin daily.  12/27/17  Yes Karamalegos, Devonne Doughty, DO  lisinopril (PRINIVIL,ZESTRIL) 20 MG tablet TAKE 1 TABLET(20 MG) BY MOUTH DAILY Patient taking differently: Take 20 mg by mouth daily.  05/30/18  Yes Karamalegos, Devonne Doughty, DO  PARoxetine (PAXIL) 20 MG tablet Take 1 tablet (20 mg total) by mouth daily. Prescribed by [facility]   Yes Parks Ranger, Devonne Doughty, DO  acetaminophen (TYLENOL) 325 MG tablet Take 2 tablets (650 mg total) by mouth every 6 (six) hours as needed for mild pain (or Fever >/= 101). 12/16/17   Nicholes Mango, MD  atorvastatin (LIPITOR) 40 MG tablet Take 1 tablet (40 mg total) by mouth at bedtime. Patient not taking: Reported on 10/18/2018 01/03/18   [name], DO  B-D UF III MINI PEN NEEDLES 31G X 5 MM MISC USE AS DIRECTED 08/07/18   Karamalegos, Devonne Doughty, DO  Blood Glucose Monitoring Suppl (ONE TOUCH ULTRA SYSTEM KIT) w/Device KIT 1 kit by Does not apply route once. 08/20/15   Arlis Porta., MD  gabapentin (NEURONTIN) 100 MG capsule Take 1 capsule (100 mg total) by mouth 3 (three) times daily. SEE NOTES Patient not taking: Reported on 10/18/2018 01/23/18   [name], DO  HUMALOG KWIKPEN 100 UNIT/ML KwikPen Inject 0.05 mLs (5 Units total) into the skin 3 (three) times daily with meals. + Sliding scale Patient not taking: Reported on  10/18/2018 03/23/18   [name], DO  metFORMIN (GLUCOPHAGE) 1000 MG tablet TAKE 1 TABLET(1000 MG) BY MOUTH TWICE DAILY WITH A MEAL 06/25/18   Karamalegos, Devonne Doughty, DO  ONETOUCH VERIO test strip Check blood sugar up to 3 x daily as needed 01/03/18   [name], DO      VITAL SIGNS:  Blood pressure 137/68, pulse (!) 114, temperature 99.2 F (37.3 C), temperature source Oral, resp. rate (!) 22, weight 121 kg, SpO2 96 %.  PHYSICAL EXAMINATION:  Physical Exam  GENERAL:  53 y.o.-year-old Caucasian patient lying in the bed with no acute distress.  EYES: Pupils equal, round, reactive to light and accommodation. No scleral icterus. Extraocular muscles intact.  HEENT: Head atraumatic, normocephalic. Oropharynx and nasopharynx clear.  NECK:  Supple, no jugular venous distention. No thyroid enlargement, no tenderness.  LUNGS: Normal  breath sounds bilaterally, no wheezing, rales,rhonchi or crepitation. No use of accessory muscles of respiration.  CARDIOVASCULAR: Regular rate and rhythm, S1, S2 normal. No murmurs, rubs, or gallops.  ABDOMEN: Soft, nondistended, nontender. Bowel sounds present. No organomegaly or mass.  EXTREMITIES: No pedal edema, cyanosis, or clubbing.  NEUROLOGIC: Cranial nerves II through XII are intact. Muscle strength 5/5 in all extremities. Sensation intact. Gait not checked.  PSYCHIATRIC: The patient is alert and oriented x 3.  Normal affect and good eye contact. SKIN: Left leg erythema with induration and mild lichenification with warmth and tenderness and ulceration with yellowish base as well as bilateral heel ulcers on bilateral heel calluses.      LABORATORY PANEL:   CBC Recent Labs  Lab 10/18/18 0024  WBC 12.8*  HGB 10.3*  HCT 32.9*  PLT 291   ------------------------------------------------------------------------------------------------------------------  Chemistries  Recent Labs  Lab 10/18/18 0024  NA 133*  K 5.1  CL 97*   CO2 23  GLUCOSE 298*  BUN 33*  CREATININE 3.09*  CALCIUM 9.0  AST 15  ALT 18  ALKPHOS 105  BILITOT 0.7   ------------------------------------------------------------------------------------------------------------------  Cardiac Enzymes No results for input(s): TROPONINI in the last 168 hours. ------------------------------------------------------------------------------------------------------------------  RADIOLOGY:  Dg Chest Port 1 View  Result Date: 10/18/2018 CLINICAL DATA:  Sepsis EXAM: PORTABLE CHEST 1 VIEW COMPARISON:  12/13/2017 FINDINGS: Linear areas of atelectasis at the right lung base. No focal opacity on the left. Heart is upper limits normal in size. No effusions or acute bony abnormality. IMPRESSION: Right base atelectasis.  No active disease. Electronically Signed   By: Rolm Baptise M.D.   On: 10/18/2018 00:41   Dg Foot Complete Left  Result Date: 10/18/2018 CLINICAL DATA:  Bilateral deep heel wounds. Rule out osteomyelitis. EXAM: LEFT FOOT - COMPLETE 3+ VIEW COMPARISON:  None. FINDINGS: Soft tissue defect about the plantar aspect of the heel. No radiopaque foreign body, tiny density in the subcutaneous tissues felt to be a soft tissue calcification. No tracking soft tissue air. No associated periosteal reaction or bony destructive change of the subjacent calcaneus. Prominent Achilles tendon enthesophyte and small plantar calcaneal spur. No fracture or dislocation. Mild hammertoe deformity of the digits. Mild dorsal soft tissue edema. IMPRESSION: Soft tissue defect about the plantar aspect of the heel consistent with wound. No radiographic findings of osteomyelitis. Electronically Signed   By: Keith Rake M.D.   On: 10/18/2018 00:44   Dg Foot Complete Right  Result Date: 10/18/2018 CLINICAL DATA:  Bilateral deep heel wounds. Rule out osteomyelitis. EXAM: RIGHT FOOT COMPLETE - 3+ VIEW COMPARISON:  Radiograph 02/24/2016 FINDINGS: Posterior soft tissue defect about the  heel. No radiopaque foreign body or tracking soft tissue air. No periosteal reaction or bony destructive change of the subjacent calcaneus. Prominent Achilles tendon enthesophyte with Achilles tendon calcifications. Prominent plantar calcaneal spur. Progressive hammertoe deformity of the second and fifth digits since prior exam. No fracture or dislocation. Mild generalized dorsal soft tissue edema. There are vascular calcifications. IMPRESSION: Posterior soft tissue defect about the heel. No radiographic findings of osteomyelitis. Electronically Signed   By: Keith Rake M.D.   On: 10/18/2018 00:46      IMPRESSION AND PLAN:  1.  Left lower extremity cellulitis with bilateral heel likely infected ulcers.  Patient will be admitted to medically monitored bed.  She will be continued on IV cefepime and vancomycin which were started in the ER.  Warm compresses will be utilized.  Pain management will be provided  2.  Mild sepsis without severe sepsis or septic shock.  This is clearly secondary to #1.  Management as above.  Will follow blood cultures and lactic acid.  3.  Acute kidney injury.  Will hydrate with IV normal saline and hold lisinopril and metformin.  Will follow BMP.  Will obtain a renal ultrasound for further assessment.  4.  Type II diabetes mellitus.  The patient will be placed on supplemental coverage with NovoLog and will continue her basal coverage.  We will hold her metformin off.  5.  Dyslipidemia.  Statin therapy will be resumed  6.  Hypertension.  We will hold off lisinopril given acute kidney injury  7.  Schizophrenia and depression.  Her Abilify and Paxil will be continued.  8.  Peripheral neuropathy.  Her Neurontin will be resumed.  9.  DVT prophylaxis.  Subcutaneous Lovenox.   All the records are reviewed and case discussed with ED provider. The plan of care was discussed in details with the patient (and family). I answered all questions. The patient agreed to  proceed with the above mentioned plan. Further management will depend upon hospital course.   CODE STATUS: Full code  TOTAL TIME TAKING CARE OF THIS PATIENT: 55 minutes.    Christel Mormon M.D on 10/18/2018 at 2:35 AM  Pager - [phone]  After 6pm go to [email or web address] - Proofreader  [facility]  Office  [phone]  CC: Primary care physician; [name], DO   Note: This dictation was prepared with Dragon dictation along with smaller phrase technology. Any transcriptional errors that result from this process are unintentional.

## 2018-10-18 NOTE — TOC Initial Note (Addendum)
Transition of Care [facility]) - Initial/Assessment Note    Patient Details  Name: [name] MRN: [id number] Date of Birth: 1965-11-18  [facility] CM/SW Contact:    Elza Rafter, RN Phone Number: 10/18/2018, 4:05 PM  Clinical Narrative:    Patient is home home with elderly mother.  Admitted with left leg cellulitis.  Has wounds to both heels.  She states she was wear a pair of shoes that were too tight but she didn't realize it because of her neuropathy.  She also states she can get around her house at baseline but over the last few days she has become weak and unable to ambulate.  She does not have and DME ain the home.  Current with her PCP  Dr. Parks Ranger.   Obtains medications at [facility] on [address]. without difficulty.   She would like a walker for when she discharges.  PT evaluation would be beneficial for her as well.  She states she is not interested in STR.  Patient drives herself to her appointments. She is interested in home health and has used [facility] in the past.  Referral made to [facility] with [place] and at this time they are unable to accept.  Patient will likely need PT, RN-wound care.    Asked MD for PT evaluation             Expected Discharge Plan: [place] Barriers to Discharge: Continued Medical Work up   Patient Goals and CMS Choice Patient states their goals for this hospitalization and ongoing recovery are:: to get well and discharge home CMS Medicare.gov Compare Post Acute Care list provided to:: Patient Choice offered to / list presented to : Patient  Expected Discharge Plan and Services Expected Discharge Plan: [place]   Discharge Planning Services: CM Consult                               HH Arranged: PT, RN, Nurse's Aide          Prior Living Arrangements/Services   Lives with:: Parents(Elderly Mother)   Do you feel safe going back to the place where you live?: Yes             Criminal Activity/Legal Involvement Pertinent to Current Situation/Hospitalization: No - Comment as needed  Activities of Daily Living Home Assistive Devices/Equipment: Grab bars in shower ADL Screening (condition at time of admission) Patient's cognitive ability adequate to safely complete daily activities?: Yes Is the patient deaf or have difficulty hearing?: Yes Does the patient have difficulty seeing, even when wearing glasses/contacts?: No Does the patient have difficulty concentrating, remembering, or making decisions?: Yes Patient able to express need for assistance with ADLs?: Yes Does the patient have difficulty dressing or bathing?: Yes Independently performs ADLs?: No Communication: Independent Dressing (OT): Independent Grooming: Needs assistance Is this a change from baseline?: Pre-admission baseline Feeding: Independent Bathing: Needs assistance Is this a change from baseline?: Pre-admission baseline Toileting: Independent In/Out Bed: Needs assistance Is this a change from baseline?: Pre-admission baseline Walks in Home: Independent Does the patient have difficulty walking or climbing stairs?: Yes Weakness of Legs: Both Weakness of Arms/Hands: None  Permission Sought/Granted Permission sought to share information with : Investment banker, corporate granted to share info w AGENCY: Home health agencies        Emotional Assessment Appearance:: Appears stated  age Attitude/Demeanor/Rapport: Gracious Affect (typically observed): Accepting Orientation: : Oriented to Self, Oriented to Place, Oriented to  Time, Oriented to Situation Alcohol / Substance Use: Not Applicable Psych Involvement: No (comment)  Admission diagnosis:  Cellulitis of left anterior lower leg [L03.116] Diabetic ulcer of left heel associated with type 2 diabetes mellitus, unspecified ulcer stage ([place]) [E93.810, L97.429] Diabetic ulcer of right heel associated with type 2  diabetes mellitus, unspecified ulcer stage ([place]) [F75.102, L97.419] Acute kidney injury (AKI) with acute tubular necrosis (ATN) ([place]) [N17.0] Sepsis with acute renal failure without septic shock, due to unspecified organism, unspecified acute renal failure type ([place]) [A41.9, R65.20, N17.9] Patient Active Problem List   Diagnosis Date Noted  . Left leg cellulitis 10/18/2018  . Pyelonephritis 12/14/2017  . Morbid obesity ([place]) 05/10/2017  . Long-term use of high-risk medication 05/10/2017  . Hyperlipidemia associated with type 2 diabetes mellitus ([place]) 06/27/2016  . Hammer toe of second toe of right foot 04/20/2016  . Essential hypertension 06/29/2015  . Schizoaffective disorder ([place]) 06/29/2015  . DM (diabetes mellitus), type 2, 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[place]) 58/52/7782  . Polyneuropathy 04/02/2014   PCP:  [name], DO Pharmacy:   [facility] [place], [place] [address] [place] [address] Phone: [phone] Fax: [place], [place] [address] [place] [address] Phone: [phone] Fax: [phone]     Social Determinants of Health ([place]) Interventions    Readmission Risk Interventions Readmission Risk Prevention Plan 10/18/2018 12/16/2017  Transportation Screening Complete Complete  PCP or Specialist Appt within 5-7 Days - Complete  Home Care Screening Complete Complete  Medication Review (RN CM) Complete Complete  Some recent data might be hidden

## 2018-10-18 NOTE — Progress Notes (Signed)
CODE SEPSIS - PHARMACY COMMUNICATION  **Broad Spectrum Antibiotics should be administered within 1 hour of Sepsis diagnosis**  Time Code Sepsis Called/Page Received: 2350  Antibiotics Ordered: vanc/cefepime  Time of 1st antibiotic administration: 0112  Additional action taken by pharmacy:   If necessary, Name of Provider/Nurse Contacted:     Tobie Lords ,PharmD Clinical Pharmacist  10/18/2018  2:55 AM

## 2018-10-18 NOTE — Progress Notes (Addendum)
[facility] [place] NAME: [name]    MR#:  [id number]  DATE OF BIRTH:  09-01-1965  SUBJECTIVE:  CHIEF COMPLAINT:   Chief Complaint  Patient presents with  . Weakness  Patient seen today Pain in both the heels noted No complaints of any chest pain No shortness of breath Low-grade fever REVIEW OF SYSTEMS:    ROS  CONSTITUTIONAL:Low grade fever. No fatigue, weakness. No weight gain, no weight loss.  EYES: No blurry or double vision.  ENT: No tinnitus. No postnasal drip. No redness of the oropharynx.  RESPIRATORY: No cough, no wheeze, no hemoptysis. No dyspnea.  CARDIOVASCULAR: No chest pain. No orthopnea. No palpitations. No syncope.  GASTROINTESTINAL: No nausea, no vomiting or diarrhea. No abdominal pain. No melena or hematochezia.  GENITOURINARY: No dysuria or hematuria.  ENDOCRINE: No polyuria or nocturia. No heat or cold intolerance.  HEMATOLOGY: No anemia. No bruising. No bleeding.  INTEGUMENTARY :heel wounds MUSCULOSKELETAL: No arthritis. No swelling. No gout.  NEUROLOGIC: No numbness, tingling, or ataxia. No seizure-type activity.  PSYCHIATRIC: No anxiety. No insomnia. No ADD.   DRUG ALLERGIES:  No Known Allergies  VITALS:  Blood pressure 126/83, pulse (!) 118, temperature 99.1 F (37.3 C), temperature source Oral, resp. rate 19, weight 121 kg, SpO2 97 %.  PHYSICAL EXAMINATION:   Physical Exam  GENERAL:  53 y.o.-year-old patient lying in the bed with no acute distress.  EYES: Pupils equal, round, reactive to light and accommodation. No scleral icterus. Extraocular muscles intact.  HEENT: Head atraumatic, normocephalic. Oropharynx and nasopharynx clear.  NECK:  Supple, no jugular venous distention. No thyroid enlargement, no tenderness.  LUNGS: Normal breath sounds bilaterally, no wheezing, rales, rhonchi. No use of accessory muscles of respiration.  CARDIOVASCULAR: S1, S2 normal. No murmurs, rubs, or gallops.   ABDOMEN: Soft, nontender, nondistended. Bowel sounds present. No organomegaly or mass.  EXTREMITIES: No cyanosis, clubbing or edema b/l.    NEUROLOGIC: Cranial nerves II through XII are intact. No focal Motor or sensory deficits b/l.   PSYCHIATRIC: The patient is alert and oriented x 3.  SKIN: necrotic wounds bilateral heels    LABORATORY PANEL:   CBC Recent Labs  Lab 10/18/18 0448  WBC 11.1*  HGB 9.4*  HCT 30.3*  PLT 264   ------------------------------------------------------------------------------------------------------------------ Chemistries  Recent Labs  Lab 10/18/18 0024 10/18/18 0448  NA 133* 133*  K 5.1 4.9  CL 97* 103  CO2 23 21*  GLUCOSE 298* 277*  BUN 33* 30*  CREATININE 3.09* 2.48*  CALCIUM 9.0 8.4*  AST 15  --   ALT 18  --   ALKPHOS 105  --   BILITOT 0.7  --    ------------------------------------------------------------------------------------------------------------------  Cardiac Enzymes No results for input(s): TROPONINI in the last 168 hours. ------------------------------------------------------------------------------------------------------------------  RADIOLOGY:  Dg Chest Port 1 View  Result Date: 10/18/2018 CLINICAL DATA:  Sepsis EXAM: PORTABLE CHEST 1 VIEW COMPARISON:  12/13/2017 FINDINGS: Linear areas of atelectasis at the right lung base. No focal opacity on the left. Heart is upper limits normal in size. No effusions or acute bony abnormality. IMPRESSION: Right base atelectasis.  No active disease. Electronically Signed   By: Rolm Baptise M.D.   On: 10/18/2018 00:41   Dg Foot Complete Left  Result Date: 10/18/2018 CLINICAL DATA:  Bilateral deep heel wounds. Rule out osteomyelitis. EXAM: LEFT FOOT - COMPLETE 3+ VIEW COMPARISON:  None. FINDINGS: Soft tissue defect about the plantar aspect of the heel. No radiopaque foreign  body, tiny density in the subcutaneous tissues felt to be a soft tissue calcification. No tracking soft tissue air.  No associated periosteal reaction or bony destructive change of the subjacent calcaneus. Prominent Achilles tendon enthesophyte and small plantar calcaneal spur. No fracture or dislocation. Mild hammertoe deformity of the digits. Mild dorsal soft tissue edema. IMPRESSION: Soft tissue defect about the plantar aspect of the heel consistent with wound. No radiographic findings of osteomyelitis. Electronically Signed   By: Keith Rake M.D.   On: 10/18/2018 00:44   Dg Foot Complete Right  Result Date: 10/18/2018 CLINICAL DATA:  Bilateral deep heel wounds. Rule out osteomyelitis. EXAM: RIGHT FOOT COMPLETE - 3+ VIEW COMPARISON:  Radiograph 02/24/2016 FINDINGS: Posterior soft tissue defect about the heel. No radiopaque foreign body or tracking soft tissue air. No periosteal reaction or bony destructive change of the subjacent calcaneus. Prominent Achilles tendon enthesophyte with Achilles tendon calcifications. Prominent plantar calcaneal spur. Progressive hammertoe deformity of the second and fifth digits since prior exam. No fracture or dislocation. Mild generalized dorsal soft tissue edema. There are vascular calcifications. IMPRESSION: Posterior soft tissue defect about the heel. No radiographic findings of osteomyelitis. Electronically Signed   By: Keith Rake M.D.   On: 10/18/2018 00:46     ASSESSMENT AND PLAN:   53 year old female patient with history of hyperlipidemia, hypertension, schizophrenia, peripheral neuropathy, type 2 diabetes mellitus, depression currently under hospitalist service  -Bilateral infected heel ulcers Continue IV vancomycin and cefepime antibiotics Wound care consult Podiatry consult  -Left lower extremity cellulitis secondary to infected heel ulcer Continue vancomycin and cefepime antibiotics Follow-up cultures  -Sepsis secondary to heel ulcers infection and cellulitis With IV fluids Follow-up cultures  -Acute kidney injury IV fluid hydration Check renal  ultrasound Hold nephrotoxic medications such as ACE inhibitor and metformin Renal USD : No hydronephrosis  -Hyperlipidemia Continue statin medication  -Hypertension ACE inhibitor on hold secondary to kidney injury  -Schizophrenia and depression Continue Abilify and Paxil  -DVT prophylaxis Subcu Lovenox to continue  All the records are reviewed and case discussed with Care Management/Social Worker. Management plans discussed with the patient, family and they are in agreement.  CODE STATUS: Full code  DVT Prophylaxis: SCDs  TOTAL TIME TAKING CARE OF THIS PATIENT: 45 minutes.   POSSIBLE D/C IN 2 to 3 DAYS, DEPENDING ON CLINICAL CONDITION.  Saundra Shelling M.D on 10/18/2018 at 10:24 AM  Between 7am to 6pm - Pager - [phone]  After 6pm go to [email or web address] - password [email or web address] [facility]  Office  [phone]  CC: Primary care physician; [name], DO  Note: This dictation was prepared with Dragon dictation along with smaller phrase technology. Any transcriptional errors that result from this process are unintentional.

## 2018-10-19 LAB — GLUCOSE, CAPILLARY
Glucose-Capillary: 224 mg/dL — ABNORMAL HIGH (ref 70–99)
Glucose-Capillary: 231 mg/dL — ABNORMAL HIGH (ref 70–99)
Glucose-Capillary: 247 mg/dL — ABNORMAL HIGH (ref 70–99)
Glucose-Capillary: 313 mg/dL — ABNORMAL HIGH (ref 70–99)

## 2018-10-19 LAB — CBC
HCT: 31.7 % — ABNORMAL LOW (ref 36.0–46.0)
Hemoglobin: 9.7 g/dL — ABNORMAL LOW (ref 12.0–15.0)
MCH: 25.8 pg — ABNORMAL LOW (ref 26.0–34.0)
MCHC: 30.6 g/dL (ref 30.0–36.0)
MCV: 84.3 fL (ref 80.0–100.0)
Platelets: 267 10*3/uL (ref 150–400)
RBC: 3.76 MIL/uL — ABNORMAL LOW (ref 3.87–5.11)
RDW: 16.1 % — ABNORMAL HIGH (ref 11.5–15.5)
WBC: 8.5 10*3/uL (ref 4.0–10.5)
nRBC: 0 % (ref 0.0–0.2)

## 2018-10-19 LAB — URINE CULTURE
Culture: NO GROWTH
Special Requests: NORMAL

## 2018-10-19 LAB — BASIC METABOLIC PANEL
Anion gap: 6 (ref 5–15)
BUN: 21 mg/dL — ABNORMAL HIGH (ref 6–20)
CO2: 23 mmol/L (ref 22–32)
Calcium: 8.7 mg/dL — ABNORMAL LOW (ref 8.9–10.3)
Chloride: 107 mmol/L (ref 98–111)
Creatinine, Ser: 1.26 mg/dL — ABNORMAL HIGH (ref 0.44–1.00)
GFR calc Af Amer: 56 mL/min — ABNORMAL LOW (ref >60)
GFR calc non Af Amer: 48 mL/min — ABNORMAL LOW (ref >60)
Glucose, Bld: 295 mg/dL — ABNORMAL HIGH (ref 70–99)
Potassium: 4.9 mmol/L (ref 3.5–5.1)
Sodium: 136 mmol/L (ref 135–145)

## 2018-10-19 MED ORDER — DOXYCYCLINE HYCLATE 50 MG PO CAPS: 100.0000 mg | ORAL_CAPSULE | Freq: Two times a day (BID) | ORAL | 0 refills | Status: AC

## 2018-10-19 MED ORDER — HYDROCODONE-ACETAMINOPHEN 5-325 MG PO TABS: 1.0000 | ORAL_TABLET | ORAL | 0 refills | Status: DC | PRN

## 2018-10-19 MED ORDER — VANCOMYCIN HCL 1.5 G IV SOLR
1500.0000 mg | INTRAVENOUS | Status: DC
  Administered 2018-10-19 – 2018-10-20 (×2): 1500 mg via INTRAVENOUS
  Filled 2018-10-19 (×3): qty 1500

## 2018-10-19 NOTE — Evaluation (Signed)
Physical Therapy Evaluation Patient Details Name: [name] MRN: [id number] DOB: 03/19/1966 Today's Date: 10/19/2018   History of Present Illness  Patient is a pleasant 53 year old female admitted for cellulitis of L anterior lower leg, diabetic ulcer of R and L heel (DM type II), AKI with ATN, and sepsis. PMH includes depression, HLD, HTN, neuromuscular disorder, scho affective schizophrenia, peripheral neuropathy, and DM II.  Clinical Impression  Patient is a pleasant 53 year old female who presents with generalized weakness and instability. She initially is fearful of movement due to dizziness and feelings of weakness however was agreeable to attempt evaluation. Patient was able to transfer EOB with Mod I with extra time and cueing. Upon reaching EOB she became dizzy and was taught purse lipped breathing which resulted in a reduction of dizziness. Vitals monitored, remaining within functional range throughout session. She was able to transfer to [facility] with CGA and self clean with CGA. Patient required frequent rest breaks due to fatigue but was able to ambulate 30 ft with max cueing for sequencing/use of RW. Patient is motivated to return to prior level of function due to her caregiver status for her mother. Patient will benefit from skilled physical therapy to increase strength, stability, and mobility to decrease falls risk. Upon discharge patient will require 24/7 supervision/aide and HHPT to ensure safe return to PLOF.     Follow Up Recommendations Home health PT;Supervision/Assistance - 24 hour    Equipment Recommendations  Rolling walker with 5" wheels;3in1 (PT)    Recommendations for Other Services       Precautions / Restrictions Precautions Precautions: Fall Restrictions Weight Bearing Restrictions: No      Mobility  Bed Mobility Overal bed mobility: Modified Independent             General bed mobility comments: able to transfer supine to sit with HOB  elevated, requires extra time but able to perform without assistance  Transfers Overall transfer level: Needs assistance Equipment used: Rolling walker (2 wheeled) Transfers: Sit to/from [facility] Sit to Stand: Min guard;Min assist Stand pivot transfers: Min guard       General transfer comment: Patient initially required Min A for STS with cueing for placement of UE's for safe stability/mobility. Patient able to SPT to [facility] with CGA and cueing for sequencing.  Ambulation/Gait Ambulation/Gait assistance: Min guard Gait Distance (Feet): 30 Feet Assistive device: Rolling walker (2 wheeled) Gait Pattern/deviations: Step-through pattern;Decreased dorsiflexion - left;Decreased weight shift to left;Decreased stride length;Narrow base of support;Trunk flexed Gait velocity: decreased   General Gait Details: requires cues for proper use of walker  Stairs            Wheelchair Mobility    Modified Rankin (Stroke Patients Only)       Balance Overall balance assessment: Needs assistance Sitting-balance support: Feet supported Sitting balance-Leahy Scale: Fair Sitting balance - Comments: able to sit without LOB with feet on floor. Initially dizzy but that faded with prolonged seated position and breathing Postural control: Posterior lean Standing balance support: Bilateral upper extremity supported;During functional activity Standing balance-Leahy Scale: Fair Standing balance comment: Patient requires BUE support for ambulation. Is able to static stand and self clean with SUE after toileting                             Pertinent Vitals/Pain Pain Assessment: No/denies pain    Home Living Family/patient expects to be discharged to:: Private residence  Living Arrangements: Parent(12 year old mother)   Type of Home: House Home Access: Stairs to enter Entrance Stairs-Rails: Right Entrance Stairs-Number of Steps: 2 Home Layout: One level Home  Equipment: Grab bars - tub/shower Additional Comments: Patient lives in single story home with her mother who she and her sister care for.    Prior Function Level of Independence: Independent         Comments: Patient reports she did not use an AD, was ambulatory, and helps take care of her mom. Independent with cooking and cleaning home.     Hand Dominance        Extremity/Trunk Assessment   Upper Extremity Assessment Upper Extremity Assessment: Overall WFL for tasks assessed    Lower Extremity Assessment Lower Extremity Assessment: RLE deficits/detail;LLE deficits/detail RLE Deficits / Details: grossly 4-/5 RLE Sensation: decreased light touch RLE Coordination: decreased gross motor LLE Deficits / Details: grossly 4-/5 LLE Sensation: decreased light touch LLE Coordination: decreased gross motor       Communication   Communication: No difficulties  Cognition Arousal/Alertness: Awake/alert Behavior During Therapy: WFL for tasks assessed/performed Overall Cognitive Status: Within Functional Limits for tasks assessed                                 General Comments: A and O x4      General Comments General comments (skin integrity, edema, etc.): LE's wrapped with noted edema    Exercises Other Exercises Other Exercises: patient educated in safe transfers, toileting,  and mobility with proper use of DME. Patient requires additional cueing for sequencing of ambulation with RW. Other Exercises: purse lipped breathing technique to decrease dizziness with transitional changes: sitting EOB balance   Assessment/Plan    PT Assessment Patient needs continued PT services  PT Problem List Decreased strength;Decreased activity tolerance;Decreased balance;Decreased knowledge of use of DME;Decreased coordination;Decreased mobility;Cardiopulmonary status limiting activity;Impaired sensation;Decreased skin integrity;Obesity       PT Treatment Interventions DME  instruction;Gait training;Stair training;Functional mobility training;Neuromuscular re-education;Balance training;Therapeutic exercise;Therapeutic activities;Patient/family education;Manual techniques    PT Goals (Current goals can be found in the Care Plan section)  Acute Rehab PT Goals Patient Stated Goal: to return home and start walking again PT Goal Formulation: With patient Time For Goal Achievement: 11/02/18 Potential to Achieve Goals: Fair    Frequency Min 2X/week   Barriers to discharge Decreased caregiver support;Inaccessible home environment Patient will need 24/7 aide/care, HHPT, and RW with 3 in 1    Co-evaluation               AM-PAC PT "6 Clicks" Mobility  Outcome Measure Help needed turning from your back to your side while in a flat bed without using bedrails?: A Little Help needed moving from lying on your back to sitting on the side of a flat bed without using bedrails?: A Little Help needed moving to and from a bed to a chair (including a wheelchair)?: A Little Help needed standing up from a chair using your arms (e.g., wheelchair or bedside chair)?: A Little Help needed to walk in hospital room?: A Little Help needed climbing 3-5 steps with a railing? : A Lot 6 Click Score: 17    End of Session Equipment Utilized During Treatment: Gait belt Activity Tolerance: Patient tolerated treatment well Patient left: in bed;with call bell/phone within reach;with bed alarm set Nurse Communication: Mobility status PT Visit Diagnosis: Unsteadiness on feet (R26.81);Other abnormalities of gait and  mobility (R26.89);Muscle weakness (generalized) (M62.81);Difficulty in walking, not elsewhere classified (R26.2);Dizziness and giddiness (R42)    Time: 7505-1071 PT Time Calculation (min) (ACUTE ONLY): 39 min   Charges:   PT Evaluation $PT Eval Low Complexity: 1 Low PT Treatments $Gait Training: 8-22 mins $Therapeutic Activity: 8-22 mins      Janna Arch, PT,  DPT   Janna Arch 10/19/2018, 1:40 PM

## 2018-10-19 NOTE — Plan of Care (Signed)

## 2018-10-19 NOTE — Progress Notes (Addendum)
[place] at [place] NAME: [name]    MR#:  [id number]  DATE OF BIRTH:  [birth year]  SUBJECTIVE:  CHIEF COMPLAINT:   Chief Complaint  Patient presents with  . Weakness  Patient seen today Pain in both the heels better No fever No complaints of any chest pain No shortness of breath No complaints of chest pain REVIEW OF SYSTEMS:    ROS  CONSTITUTIONAL:Low grade fever. No fatigue, weakness. No weight gain, no weight loss.  EYES: No blurry or double vision.  ENT: No tinnitus. No postnasal drip. No redness of the oropharynx.  RESPIRATORY: No cough, no wheeze, no hemoptysis. No dyspnea.  CARDIOVASCULAR: No chest pain. No orthopnea. No palpitations. No syncope.  GASTROINTESTINAL: No nausea, no vomiting or diarrhea. No abdominal pain. No melena or hematochezia.  GENITOURINARY: No dysuria or hematuria.  ENDOCRINE: No polyuria or nocturia. No heat or cold intolerance.  HEMATOLOGY: No anemia. No bruising. No bleeding.  INTEGUMENTARY :heel wounds MUSCULOSKELETAL: No arthritis. No swelling. No gout.  NEUROLOGIC: No numbness, tingling, or ataxia. No seizure-type activity.  PSYCHIATRIC: No anxiety. No insomnia. No ADD.   DRUG ALLERGIES:  No Known Allergies  VITALS:  Blood pressure (!) 142/86, pulse 90, temperature 97.9 F (36.6 C), temperature source Oral, resp. rate 18, weight 121 kg, SpO2 95 %.  PHYSICAL EXAMINATION:   Physical Exam  GENERAL:  53 y.o.-year-old patient lying in the bed with no acute distress.  EYES: Pupils equal, round, reactive to light and accommodation. No scleral icterus. Extraocular muscles intact.  HEENT: Head atraumatic, normocephalic. Oropharynx and nasopharynx clear.  NECK:  Supple, no jugular venous distention. No thyroid enlargement, no tenderness.  LUNGS: Normal breath sounds bilaterally, no wheezing, rales, rhonchi. No use of accessory muscles of respiration.  CARDIOVASCULAR: S1, S2 normal. No  murmurs, rubs, or gallops.  ABDOMEN: Soft, nontender, nondistended. Bowel sounds present. No organomegaly or mass.  EXTREMITIES: No cyanosis, clubbing or edema b/l.    NEUROLOGIC: Cranial nerves II through XII are intact. No focal Motor or sensory deficits b/l.   PSYCHIATRIC: The patient is alert and oriented x 3.  SKIN: necrotic wounds bilateral heels    LABORATORY PANEL:   CBC Recent Labs  Lab 10/19/18 0435  WBC 8.5  HGB 9.7*  HCT 31.7*  PLT 267   ------------------------------------------------------------------------------------------------------------------ Chemistries  Recent Labs  Lab 10/18/18 0024  10/19/18 0435  NA 133*   < > 136  K 5.1   < > 4.9  CL 97*   < > 107  CO2 23   < > 23  GLUCOSE 298*   < > 295*  BUN 33*   < > 21*  CREATININE 3.09*   < > 1.26*  CALCIUM 9.0   < > 8.7*  AST 15  --   --   ALT 18  --   --   ALKPHOS 105  --   --   BILITOT 0.7  --   --    < > = values in this interval not displayed.   ------------------------------------------------------------------------------------------------------------------  Cardiac Enzymes No results for input(s): TROPONINI in the last 168 hours. ------------------------------------------------------------------------------------------------------------------  RADIOLOGY:  US Renal  Result Date: 10/18/2018 CLINICAL DATA:  Acute renal failure EXAM: RENAL / URINARY TRACT ULTRASOUND COMPLETE COMPARISON:  None. FINDINGS: Right Kidney: Renal measurements: 12.7 x 5.4 x 5.7 cm = volume: 204 mL . Echogenicity within normal limits. No mass or hydronephrosis visualized. Left Kidney: Renal measurements: 13.5 x 6.0 x  5.6 cm = volume: 237 mL. Echogenicity within normal limits. No mass or hydronephrosis visualized. Bladder: Appears normal for degree of bladder distention. IMPRESSION: No hydronephrosis. Electronically Signed   By: Lovey Newcomer M.D.   On: 10/18/2018 13:02   Dg Chest Port 1 View  Result Date: 10/18/2018 CLINICAL  DATA:  Sepsis EXAM: PORTABLE CHEST 1 VIEW COMPARISON:  12/13/2017 FINDINGS: Linear areas of atelectasis at the right lung base. No focal opacity on the left. Heart is upper limits normal in size. No effusions or acute bony abnormality. IMPRESSION: Right base atelectasis.  No active disease. Electronically Signed   By: Rolm Baptise M.D.   On: 10/18/2018 00:41   Dg Foot Complete Left  Result Date: 10/18/2018 CLINICAL DATA:  Bilateral deep heel wounds. Rule out osteomyelitis. EXAM: LEFT FOOT - COMPLETE 3+ VIEW COMPARISON:  None. FINDINGS: Soft tissue defect about the plantar aspect of the heel. No radiopaque foreign body, tiny density in the subcutaneous tissues felt to be a soft tissue calcification. No tracking soft tissue air. No associated periosteal reaction or bony destructive change of the subjacent calcaneus. Prominent Achilles tendon enthesophyte and small plantar calcaneal spur. No fracture or dislocation. Mild hammertoe deformity of the digits. Mild dorsal soft tissue edema. IMPRESSION: Soft tissue defect about the plantar aspect of the heel consistent with wound. No radiographic findings of osteomyelitis. Electronically Signed   By: Keith Rake M.D.   On: 10/18/2018 00:44   Dg Foot Complete Right  Result Date: 10/18/2018 CLINICAL DATA:  Bilateral deep heel wounds. Rule out osteomyelitis. EXAM: RIGHT FOOT COMPLETE - 3+ VIEW COMPARISON:  Radiograph 02/24/2016 FINDINGS: Posterior soft tissue defect about the heel. No radiopaque foreign body or tracking soft tissue air. No periosteal reaction or bony destructive change of the subjacent calcaneus. Prominent Achilles tendon enthesophyte with Achilles tendon calcifications. Prominent plantar calcaneal spur. Progressive hammertoe deformity of the second and fifth digits since prior exam. No fracture or dislocation. Mild generalized dorsal soft tissue edema. There are vascular calcifications. IMPRESSION: Posterior soft tissue defect about the heel. No  radiographic findings of osteomyelitis. Electronically Signed   By: Keith Rake M.D.   On: 10/18/2018 00:46     ASSESSMENT AND PLAN:   53 year old female patient with history of hyperlipidemia, hypertension, schizophrenia, peripheral neuropathy, type 2 diabetes mellitus, depression currently under hospitalist service  -Bilateral infected heel ulcers Continue IV vancomycin and cefepime antibiotics Wound care consult Podiatry consult appreciated Bilateral heel ulcers debrided  -Left lower extremity cellulitis secondary to infected heel ulcer Continue vancomycin and cefepime antibiotics Follow-up cultures  -Sepsis secondary to heel ulcers infection and cellulitis With IV fluids Follow-up cultures  -Acute kidney injury IV fluid hydration Check renal ultrasound Hold nephrotoxic medications such as ACE inhibitor and metformin Renal USD : No hydronephrosis  -Hyperlipidemia Continue statin medication  -Hypertension ACE inhibitor on hold secondary to kidney injury  -Schizophrenia and depression Continue Abilify and Paxil  -DVT prophylaxis Subcu Lovenox to continue  -Ambulatory dysfunction Physical therapy evaluation  -Discharge patient in AM with home health services to home DW with patient and sister in detail  All the records are reviewed and case discussed with Care Management/Social Worker. Management plans discussed with the patient, family and they are in agreement.  CODE STATUS: Full code  DVT Prophylaxis: SCDs  TOTAL TIME TAKING CARE OF THIS PATIENT: 38 minutes.   POSSIBLE D/C IN 2 to 3 DAYS, DEPENDING ON CLINICAL CONDITION.  Saundra Shelling M.D on 10/19/2018 at 12:04 PM  Between 7am  to 6pm - Pager - [phone]  After 6pm go to [email or web address] - password [email or web address] [facility]  Office  [phone]  CC: Primary care physician; [name], DO  Note: This dictation was prepared with Dragon dictation along with smaller  phrase technology. Any transcriptional errors that result from this process are unintentional.

## 2018-10-19 NOTE — TOC Progression Note (Signed)
Transition of Care [facility]) - Progression Note    Patient Details  Name: [name] MRN: [id number] Date of Birth: [birth year]  Transition of Care [facility]) CM/SW [place], RN Phone Number: 10/19/2018, 3:18 PM  Clinical Narrative:    [place] and they are not able to accept the patient, contacted, [place] with [facility], they are unable to accept the patient, I called [facility], they are unable to accept the patient, I called [facility] care and they are unable to accept the patient, I called [facility] and they are unable to accept the Patient as the patient lives out of their service area, [employer] and they are not able to accept the patient, I called [facility] and they are unable to accept the patient at this time.     Expected Discharge Plan: [place] Barriers to Discharge: Continued Medical Work up  Expected Discharge Plan and Services Expected Discharge Plan: [place]   Discharge Planning Services: CM Consult     Expected Discharge Date: 10/19/18               DME Arranged: Gilford Rile rolling DME Agency: [employer] Date DME Agency Contacted: 10/19/18 Time DME Agency Contacted: [phone] Representative spoke with at DME Agency: [place]: PT, RN, Nurse's Aide           Social Determinants of Health ([place]) Interventions    Readmission Risk Interventions Readmission Risk Prevention Plan 10/18/2018 12/16/2017  Transportation Screening Complete Complete  PCP or Specialist Appt within 5-7 Days - Complete  Home Care Screening Complete Complete  Medication Review (RN CM) Complete Complete  Some recent data might be hidden

## 2018-10-19 NOTE — TOC Progression Note (Signed)
Transition of Care [facility]) - Progression Note    Patient Details  Name: [name] MRN: [id number] Date of Birth: 11-Apr-1966  Transition of Care [facility]) CM/SW Contact  Su Hilt, RN Phone Number: 10/19/2018, 11:04 AM  Clinical Narrative:     Damaris Schooner with Tommi Rumps at [place], He stated that they are not able to accept the patient due to the insurance payor she has.  I called and spoke to [place] with [facility] and they do not have the ability to accept the patient due to not having a wound care nurse available.  I called Cassie with [facility], she stated that [facility] is ONN with the insurance and can't accept the patient.  I called [place] with [facility] and they do not have a nurse to be able to see the patient.  Expected Discharge Plan: [place] Barriers to Discharge: Continued Medical Work up  Expected Discharge Plan and Services Expected Discharge Plan: [place]   Discharge Planning Services: CM Consult     Expected Discharge Date: 10/19/18               DME Arranged: Gilford Rile rolling DME Agency: [employer] Date DME Agency Contacted: 10/19/18 Time DME Agency Contacted: [phone] Representative spoke with at DME Agency: [place]: PT, RN, Nurse's Aide           Social Determinants of Health ([place]) Interventions    Readmission Risk Interventions Readmission Risk Prevention Plan 10/18/2018 12/16/2017  Transportation Screening Complete Complete  PCP or Specialist Appt within 5-7 Days - Complete  Home Care Screening Complete Complete  Medication Review (RN CM) Complete Complete  Some recent data might be hidden

## 2018-10-19 NOTE — Progress Notes (Signed)
Pharmacy Antibiotic Note  [name] is a 53 y.o. female admitted on 10/17/2018 with wound infection of L&RLE w/ c/o weakness found to meet 2/4 SIRs criteria w/ WBC 12.8, PCT 0.16, XRF showing soft tissue swelling w/o osteomyelitis, patient also has a h/o pyelonephritis w/o CKD; however, currently patient is in AKI w/ Scr 3.03 (BL 0.99 - 1.24).  Pharmacy has been consulted for vanc dosing. Patient received vanc 2g IV load and cefepime 2g IV x 1.  Plan: Patient's renal function has improved and appears to no longer have AKI. Will restart patient on AUC dosing.  Vancomycin 1500 mg IV Q 24 hrs. Goal AUC 400-550. Expected AUC: 486.3 Css: 11.2 SCr used: 1.26 T1/2: 13.6 hours    Weight: 266 lb 12.1 oz (121 kg)  Temp (24hrs), Avg:98.4 F (36.9 C), Min:97.9 F (36.6 C), Max:98.7 F (37.1 C)  Recent Labs  Lab 10/18/18 0024 10/18/18 0448 10/19/18 0435  WBC 12.8* 11.1* 8.5  CREATININE 3.09* 2.48* 1.26*  LATICACIDVEN 1.3  --   --   VANCORANDOM  --  20  --     Estimated Creatinine Clearance: 72.7 mL/min (A) (by C-G formula based on SCr of 1.26 mg/dL (H)).    No Known Allergies  Thank you for allowing pharmacy to be a part of this patient's care.  Tobie Lords, PharmD, BCPS Clinical Pharmacist 10/19/2018 11:57 AM

## 2018-10-19 NOTE — TOC Progression Note (Signed)
Transition of Care [facility]) - Progression Note    Patient Details  Name: [name] MRN: [id number] Date of Birth: 1966-02-25  Transition of Care [facility]) CM/SW Contact  Su Hilt, RN Phone Number: 10/19/2018, 3:47 PM  Clinical Narrative:     Damaris Schooner to [facility] again since they had the patient previously.  He stated that they do not have nursing staff to accept the patient at this time  Expected Discharge Plan: [place] Barriers to Discharge: Continued Medical Work up  Expected Discharge Plan and Services Expected Discharge Plan: [place]   Discharge Planning Services: CM Consult     Expected Discharge Date: 10/19/18               DME Arranged: Gilford Rile rolling DME Agency: [employer] Date DME Agency Contacted: 10/19/18 Time DME Agency Contacted: [phone] Representative spoke with at DME Agency: [place] [facility] Arranged: PT, RN, Nurse's Aide           Social Determinants of Health ([place]) Interventions    Readmission Risk Interventions Readmission Risk Prevention Plan 10/18/2018 12/16/2017  Transportation Screening Complete Complete  PCP or Specialist Appt within 5-7 Days - Complete  Home Care Screening Complete Complete  Medication Review (RN CM) Complete Complete  Some recent data might be hidden

## 2018-10-19 NOTE — Progress Notes (Addendum)
Inpatient Diabetes Program Recommendations  AACE/ADA: New Consensus Statement on Inpatient Glycemic Control (2015)  Target Ranges:  Prepandial:   less than 140 mg/dL      Peak postprandial:   less than 180 mg/dL (1-2 hours)      Critically ill patients:  140 - 180 mg/dL   Results for [name], [name] (MRN [id number]) as of 10/19/2018 07:29  Ref. Range 10/18/2018 07:59 10/18/2018 11:33 10/18/2018 17:43 10/18/2018 21:46  Glucose-Capillary Latest Ref Range: 70 - 99 mg/dL 253 (H)  8 units NOVOLOG +  40 units LANTUS  304 (H)  11 units NOVOLOG  249 (H)  5 units NOVOLOG  249 (H)  5 units NOVOLOG    Results for [name], [name] (MRN [id number]) as of 10/19/2018 09:56  Ref. Range 10/19/2018 08:06  Glucose-Capillary Latest Ref Range: 70 - 99 mg/dL 247 (H)    Results for [name], [name] (MRN [id number]) as of 10/19/2018 07:29  Ref. Range 11/07/2017 09:25 03/28/2018 00:00 10/18/2018 04:48  Hemoglobin A1C Latest Ref Range: 4.8 - 5.6 % >14.0 (H) 11.9 12.9 (H)  (323 mg/dl)     Admit LLE cellulitis with bilateral heel likely infected ulcers/ Mild Sepsis/ Acute kidney injury  History: DM, Schizophrenia   Home Meds: Lantus 52 units Daily            Metformin 1000 mg BID   Current Orders: Lantus 40 units Daily      Novolog Moderate Correction Scale/ SSI (0-15 units) TID AC + HS       Current A1c shows poor control at home--Has been consistently uncontrolled at home as evidenced by her last several A1c results.   Will try to speak with pt today to assess her home DM regimen and encourage better control.  Endocrinologist: [facility] Last seen by telemedicine visit: 08/01/2018 Was advised of the following: Continue metformin 1000 mg twice daily, Lantus 53 units each evening, Humalog 5 units with meals plus sliding scale beginning with 2 units for blood sugar 150-200, increasing by 2 units for every elevation of blood sugar 50 over 200 -Patient was advised of the  importance of regular monitoring of blood sugars.  -She is asked to check her blood sugar before each meal and before bed.  -Patient is reminded to bring blood sugar log and/or meter to every visit     MD- Please consider the following in-hospital insulin adjustments:  1. Increase Lantus to 45 units Daily  2. Start Novolog Meal Coverage: Novolog 6 units TID with meals  (Please add the following Hold Parameters: Hold if pt eats <50% of meal, Hold if pt NPO)   Addendum 2pm- Met with pt today to discus her Current A1c of 12.9%.  Pt told me she has not been taking her Humalog as instructed by the ENDO office.  Told me she is fearful of HYPO events and often forgets to take the Humalog.  Is usually diligent with taking her Lantus and Metformin.  I discussed with pt that given her elevated A1c, she likely needs to take the Humalog as prescribed.  We discussed various ways to help her remember and pt stated she would try to do a better job remembering and following through.  Discussed HYPO signs and symptoms and treatment.   --Will follow patient during hospitalization--  Wyn Quaker RN, MSN, CDE Diabetes Coordinator Inpatient Glycemic Control Team Team Pager: [phone] (8a-5p)

## 2018-10-20 LAB — BASIC METABOLIC PANEL
Anion gap: 7 (ref 5–15)
BUN: 12 mg/dL (ref 6–20)
CO2: 23 mmol/L (ref 22–32)
Calcium: 8.8 mg/dL — ABNORMAL LOW (ref 8.9–10.3)
Chloride: 108 mmol/L (ref 98–111)
Creatinine, Ser: 1.11 mg/dL — ABNORMAL HIGH (ref 0.44–1.00)
GFR calc Af Amer: >60 mL/min (ref >60)
GFR calc non Af Amer: 56 mL/min — ABNORMAL LOW (ref >60)
Glucose, Bld: 216 mg/dL — ABNORMAL HIGH (ref 70–99)
Potassium: 5.7 mmol/L — ABNORMAL HIGH (ref 3.5–5.1)
Sodium: 138 mmol/L (ref 135–145)

## 2018-10-20 LAB — GLUCOSE, CAPILLARY
Glucose-Capillary: 199 mg/dL — ABNORMAL HIGH (ref 70–99)
Glucose-Capillary: 232 mg/dL — ABNORMAL HIGH (ref 70–99)
Glucose-Capillary: 302 mg/dL — ABNORMAL HIGH (ref 70–99)
Glucose-Capillary: 257 mg/dL — ABNORMAL HIGH (ref 70–99)

## 2018-10-20 LAB — POTASSIUM: Potassium: 5.1 mmol/L (ref 3.5–5.1)

## 2018-10-20 MED ORDER — INSULIN GLARGINE 100 UNIT/ML ~~LOC~~ SOLN
50.0000 [IU] | Freq: Every day | SUBCUTANEOUS | Status: DC
  Administered 2018-10-21: 09:00:00 50 [IU] via SUBCUTANEOUS
  Filled 2018-10-20 (×2): qty 0.5

## 2018-10-20 MED ORDER — PATIROMER SORBITEX CALCIUM 8.4 G PO PACK
16.8000 g | PACK | Freq: Once | ORAL | Status: AC
  Administered 2018-10-20: 15:00:00 8.4 g via ORAL
  Filled 2018-10-20: qty 2
  Filled 2018-10-20: qty 1

## 2018-10-20 NOTE — Progress Notes (Signed)
[place] at [place] NAME: [name]    MR#:  [id number]  DATE OF BIRTH:  February 02, 1966  SUBJECTIVE:  CHIEF COMPLAINT:   Chief Complaint  Patient presents with  . Weakness   Patient seen today Pain in both the heels better No fever No complaints of any chest pain No shortness of breath No complaints of chest pain  potassium is high today.  REVIEW OF SYSTEMS:    ROS  CONSTITUTIONAL:Low grade fever. No fatigue, weakness. No weight gain, no weight loss.  EYES: No blurry or double vision.  ENT: No tinnitus. No postnasal drip. No redness of the oropharynx.  RESPIRATORY: No cough, no wheeze, no hemoptysis. No dyspnea.  CARDIOVASCULAR: No chest pain. No orthopnea. No palpitations. No syncope.  GASTROINTESTINAL: No nausea, no vomiting or diarrhea. No abdominal pain. No melena or hematochezia.  GENITOURINARY: No dysuria or hematuria.  ENDOCRINE: No polyuria or nocturia. No heat or cold intolerance.  HEMATOLOGY: No anemia. No bruising. No bleeding.  INTEGUMENTARY :heel wounds MUSCULOSKELETAL: No arthritis. No swelling. No gout.  NEUROLOGIC: No numbness, tingling, or ataxia. No seizure-type activity.  PSYCHIATRIC: No anxiety. No insomnia. No ADD.   DRUG ALLERGIES:  No Known Allergies  VITALS:  Blood pressure (!) 142/84, pulse 86, temperature (!) 97.5 F (36.4 C), temperature source Oral, resp. rate 18, height 5\' 7"  (1.702 m), weight 121 kg, SpO2 96 %.  PHYSICAL EXAMINATION:   Physical Exam  GENERAL:  53 y.o.-year-old patient lying in the bed with no acute distress.  EYES: Pupils equal, round, reactive to light and accommodation. No scleral icterus. Extraocular muscles intact.  HEENT: Head atraumatic, normocephalic. Oropharynx and nasopharynx clear.  NECK:  Supple, no jugular venous distention. No thyroid enlargement, no tenderness.  LUNGS: Normal breath sounds bilaterally, no wheezing, rales, rhonchi. No use of accessory  muscles of respiration.  CARDIOVASCULAR: S1, S2 normal. No murmurs, rubs, or gallops.  ABDOMEN: Soft, nontender, nondistended. Bowel sounds present. No organomegaly or mass.  EXTREMITIES: No cyanosis, clubbing or edema b/l.   Dressing in both feets. NEUROLOGIC: Cranial nerves II through XII are intact. No focal Motor or sensory deficits b/l.   PSYCHIATRIC: The patient is alert and oriented x 3.  SKIN: necrotic wounds bilateral heels    LABORATORY PANEL:   CBC Recent Labs  Lab 10/19/18 0435  WBC 8.5  HGB 9.7*  HCT 31.7*  PLT 267   ------------------------------------------------------------------------------------------------------------------ Chemistries  Recent Labs  Lab 10/18/18 0024  10/20/18 0556  NA 133*   < > 138  K 5.1   < > 5.7*  CL 97*   < > 108  CO2 23   < > 23  GLUCOSE 298*   < > 216*  BUN 33*   < > 12  CREATININE 3.09*   < > 1.11*  CALCIUM 9.0   < > 8.8*  AST 15  --   --   ALT 18  --   --   ALKPHOS 105  --   --   BILITOT 0.7  --   --    < > = values in this interval not displayed.   ------------------------------------------------------------------------------------------------------------------  Cardiac Enzymes No results for input(s): TROPONINI in the last 168 hours. ------------------------------------------------------------------------------------------------------------------  RADIOLOGY:  No results found.   ASSESSMENT AND PLAN:   53 year old female patient with history of hyperlipidemia, hypertension, schizophrenia, peripheral neuropathy, type 2 diabetes mellitus, depression currently under hospitalist service  -Bilateral infected heel ulcers Continue IV vancomycin and  cefepime antibiotics Wound care consult Podiatry consult appreciated Bilateral heel ulcers debrided  -Left lower extremity cellulitis secondary to infected heel ulcer Continue vancomycin and cefepime antibiotics Follow-up cultures  -Sepsis secondary to heel ulcers  infection and cellulitis With IV fluids Follow-up cultures  -Acute kidney injury IV fluid hydration Check renal ultrasound Hold nephrotoxic medications such as ACE inhibitor and metformin Renal USD : No hydronephrosis  -Hyperlipidemia Continue statin medication  -Hypertension ACE inhibitor on hold secondary to kidney injury  -Schizophrenia and depression Continue Abilify and Paxil  -Hyperkalemia Give Veltassa 1 dose and recheck.  -DVT prophylaxis Subcu Lovenox to continue  -Ambulatory dysfunction Physical therapy evaluation  -Discharge patient in AM with home health services to home DW with patient and sister in detail  All the records are reviewed and case discussed with Care Management/Social Worker. Management plans discussed with the patient, family and they are in agreement.  CODE STATUS: Full code  DVT Prophylaxis: SCDs  TOTAL TIME TAKING CARE OF THIS PATIENT: 38 minutes.   POSSIBLE D/C IN 1-2 DAYS, DEPENDING ON CLINICAL CONDITION.  [name] M.D on 10/20/2018 at 5:04 PM  Between 7am to 6pm - Pager - [phone]  After 6pm go to [email or web address] - password [email or web address] [facility]  Office  [phone]  CC: Primary care physician; Gabriela Hauser, DO  Note: This dictation was prepared with Dragon dictation along with smaller phrase technology. Any transcriptional errors that result from this process are unintentional.

## 2018-10-21 LAB — BASIC METABOLIC PANEL
Anion gap: 8 (ref 5–15)
BUN: 10 mg/dL (ref 6–20)
CO2: 22 mmol/L (ref 22–32)
Calcium: 8.9 mg/dL (ref 8.9–10.3)
Chloride: 104 mmol/L (ref 98–111)
Creatinine, Ser: 1 mg/dL (ref 0.44–1.00)
GFR calc Af Amer: >60 mL/min (ref >60)
GFR calc non Af Amer: >60 mL/min (ref >60)
Glucose, Bld: 276 mg/dL — ABNORMAL HIGH (ref 70–99)
Potassium: 4.9 mmol/L (ref 3.5–5.1)
Sodium: 134 mmol/L — ABNORMAL LOW (ref 135–145)

## 2018-10-21 LAB — GLUCOSE, CAPILLARY
Glucose-Capillary: 240 mg/dL — ABNORMAL HIGH (ref 70–99)
Glucose-Capillary: 300 mg/dL — ABNORMAL HIGH (ref 70–99)

## 2018-10-21 MED ORDER — LISINOPRIL 20 MG PO TABS
20.0000 mg | ORAL_TABLET | Freq: Every day | ORAL | Status: DC
  Administered 2018-10-21: 20 mg via ORAL
  Filled 2018-10-21: qty 1

## 2018-10-21 MED ORDER — SODIUM ZIRCONIUM CYCLOSILICATE 5 G PO PACK
5.0000 g | PACK | Freq: Once | ORAL | Status: AC
  Administered 2018-10-21: 09:00:00 5 g via ORAL
  Filled 2018-10-21: qty 1

## 2018-10-21 NOTE — TOC Progression Note (Signed)
Transition of Care [facility]) - Progression Note    Patient Details  Name: [name] MRN: [id number] Date of Birth: 02/12/1966  [facility]) CM/SW Contact  Ross Ludwig, [place] Phone Number: 10/21/2018, 1:42 PM  Clinical Narrative:     CSW contacted [facility] again, they are not able to accept patient, CSW also called [facility], they are unable to accept patient for home health nursing.  CSW contacted [place] [phone] waiting for a call back from their referral nurse.   Expected Discharge Plan: [place] Barriers to Discharge: Continued Medical Work up  Expected Discharge Plan and Services Expected Discharge Plan: [place]   Discharge Planning Services: CM Consult     Expected Discharge Date: 10/21/18               DME Arranged: Gilford Rile rolling DME Agency: [employer] Date DME Agency Contacted: 10/19/18 Time DME Agency Contacted: [phone] Representative spoke with at DME Agency: [place]: PT, RN, Nurse's Aide           Social Determinants of Health ([place]) Interventions    Readmission Risk Interventions Readmission Risk Prevention Plan 10/18/2018 12/16/2017  Transportation Screening Complete Complete  PCP or Specialist Appt within 5-7 Days - Complete  Home Care Screening Complete Complete  Medication Review (RN CM) Complete Complete  Some recent data might be hidden

## 2018-10-21 NOTE — TOC Transition Note (Signed)
Transition of Care [facility]) - CM/SW Discharge Note   Patient Details  Name: [name] MRN: [id number] Date of Birth: [birth year]  Transition of Care [facility]) CM/SW Contact:  Latanya Maudlin, RN Phone Number: 10/21/2018, 2:43 PM   Clinical Narrative:   Patient to be discharged per MD order. Orders in place for home health services. Patient in need of dressing changes. Every local agency is not able to take patient due to staffing and not being able to work with [facility] insurance. The only agency who could provide services is [facility] home health but they do not have a nurse available until Wednesday. As this is the only option this is whom we will set up home health with. Malachy Mood is following and has received referral. Patient given instructions on wound management by bedside RN. Patient has a rolling walker. Family to transport.     Final next level of care: [place] Barriers to Discharge: No Barriers Identified   Patient Goals and CMS Choice Patient states their goals for this hospitalization and ongoing recovery are:: to get my dressings changes CMS Medicare.gov Compare Post Acute Care list provided to:: Patient Choice offered to / list presented to : Patient  Discharge Placement                       Discharge Plan and Services   Discharge Planning Services: CM Consult            DME Arranged: Gilford Rile rolling DME Agency: [employer] Date DME Agency Contacted: 10/19/18 Time DME Agency Contacted: [phone] Representative spoke with at DME Agency: [place]: RN, PT, Nurse's Aide [place] Agency: [place] Date [place]: 10/21/18 Time [place]: 1443 Representative spoke with at [place]: [place] ([place]) Interventions     Readmission Risk Interventions Readmission Risk Prevention Plan 10/21/2018 10/18/2018 12/16/2017  Post Dischage Appt Complete - -  Medication Screening Complete - -   Transportation Screening Complete Complete Complete  PCP or Specialist Appt within 5-7 Days - - Complete  Home Care Screening - Complete Complete  Medication Review (RN CM) - Complete Complete  Some recent data might be hidden

## 2018-10-21 NOTE — Progress Notes (Signed)
Home medications returned to patient. Patient signed and sheet placed on shadow chart.

## 2018-10-21 NOTE — Discharge Summary (Signed)
[facility] NAME: Gabriela [name]    MR#:  [id number]  DATE OF BIRTH:  [birth year]  DATE OF ADMISSION:  10/17/2018 ADMITTING PHYSICIAN: Christel Mormon, MD  DATE OF DISCHARGE: 10/21/2018   PRIMARY CARE PHYSICIAN: [name], DO    ADMISSION DIAGNOSIS:  Cellulitis of left anterior lower leg [L03.116] Diabetic ulcer of left heel associated with type 2 diabetes mellitus, unspecified ulcer stage ([place]) [E11.621, L97.429] Diabetic ulcer of right heel associated with type 2 diabetes mellitus, unspecified ulcer stage ([place]) [Y86.578, L97.419] Acute kidney injury (AKI) with acute tubular necrosis (ATN) ([place]) [N17.0] Sepsis with acute renal failure without septic shock, due to unspecified organism, unspecified acute renal failure type ([place]) [A41.9, R65.20, N17.9]  DISCHARGE DIAGNOSIS:  Active Problems:   Left leg cellulitis   SECONDARY DIAGNOSIS:   Past Medical History:  Diagnosis Date  . Depression   . Hyperlipidemia   . Hypertension   . Neuromuscular disorder ([name])   . Schizo affective schizophrenia ([place])    abilify and pazil    HOSPITAL COURSE:   53 year old female patient with history of hyperlipidemia, hypertension, schizophrenia, peripheral neuropathy, type 2 diabetes mellitus, depression currently under hospitalist service  -Bilateral infected heel ulcers Continue IV vancomycin and cefepime antibiotics Wound care consult Podiatry consult appreciated Bilateral heel ulcers debrided Oral Abx and follow in podiatry clinic in 2 weeks.  -Left lower extremity cellulitis secondary to infected heel ulcer Continue vancomycin and cefepime antibiotics Follow-up cultures- no growth  -Sepsis secondary to heel ulcers infection and cellulitis With IV fluids Follow-up cultures- no growth.  -Acute kidney injury IV fluid hydration Check renal ultrasound Hold nephrotoxic medications such as ACE inhibitor and  metformin Renal USD : No hydronephrosis  -Hyperlipidemia Continue statin medication  -Hypertension ACE inhibitor on hold secondary to kidney injury  -Schizophrenia and depression Continue Abilify and Paxil  -Hyperkalemia Give Veltassa 1 dose and recheck. Improved.  -DVT prophylaxis Subcu Lovenox to continue  -Ambulatory dysfunction Physical therapy evaluation- suggest home health- pt was able to ambulate with a walker.  DISCHARGE CONDITIONS:   Stable.  CONSULTS OBTAINED:    DRUG ALLERGIES:  No Known Allergies  DISCHARGE MEDICATIONS:   Allergies as of 10/21/2018   No Known Allergies     Medication List    STOP taking these medications   atorvastatin 40 MG tablet Commonly known as: LIPITOR   gabapentin 100 MG capsule Commonly known as: NEURONTIN     TAKE these medications   acetaminophen 325 MG tablet Commonly known as: TYLENOL Take 2 tablets (650 mg total) by mouth every 6 (six) hours as needed for mild pain (or Fever >/= 101).   ARIPiprazole 10 MG tablet Commonly known as: ABILIFY Take 10 mg by mouth daily.   B-D UF III MINI PEN NEEDLES 31G X 5 MM Misc Generic drug: Insulin Pen Needle USE AS DIRECTED   doxycycline 50 MG capsule Commonly known as: VIBRAMYCIN Take 2 capsules (100 mg total) by mouth 2 (two) times daily for 10 days.   HumaLOG KwikPen 100 UNIT/ML KwikPen Generic drug: insulin lispro Inject 0.05 mLs (5 Units total) into the skin 3 (three) times daily with meals. + Sliding scale   HYDROcodone-acetaminophen 5-325 MG tablet Commonly known as: NORCO/VICODIN Take 1-2 tablets by mouth every 4 (four) hours as needed for moderate pain.   Insulin Glargine 100 UNIT/ML Solostar Pen Commonly known as: Lantus SoloStar Inject 50 Units into the skin daily. Adjust dose as  advised What changed:   how much to take  additional instructions   lisinopril 20 MG tablet Commonly known as: ZESTRIL TAKE 1 TABLET(20 MG) BY MOUTH DAILY What  changed: See the new instructions.   metFORMIN 1000 MG tablet Commonly known as: GLUCOPHAGE TAKE 1 TABLET(1000 MG) BY MOUTH TWICE DAILY WITH A MEAL   ONE TOUCH ULTRA SYSTEM KIT w/Device Kit 1 kit by Does not apply route once.   OneTouch Verio test strip Generic drug: glucose blood Check blood sugar up to 3 x daily as needed   PARoxetine 20 MG tablet Commonly known as: PAXIL Take 1 tablet (20 mg total) by mouth daily. Prescribed by [facility]            Durable Medical Equipment  (From admission, onward)         Start     Ordered   10/19/18 1052  For home use only DME Walker rolling  Once    Question:  Patient needs a walker to treat with the following condition  Answer:  Need for assistance due to unsteady gait   10/19/18 1051           DISCHARGE INSTRUCTIONS:    Follow with Podiatry clinic in 2 weeks.  If you experience worsening of your admission symptoms, develop shortness of breath, life threatening emergency, suicidal or homicidal thoughts you must seek medical attention immediately by calling 911 or calling your MD immediately  if symptoms less severe.  You Must read complete instructions/literature along with all the possible adverse reactions/side effects for all the Medicines you take and that have been prescribed to you. Take any new Medicines after you have completely understood and accept all the possible adverse reactions/side effects.   Please note  You were cared for by a hospitalist during your hospital stay. If you have any questions about your discharge medications or the care you received while you were in the hospital after you are discharged, you can call the unit and asked to speak with the hospitalist on call if the hospitalist that took care of you is not available. Once you are discharged, your primary care physician will handle any further medical issues. Please note that NO REFILLS for any discharge medications will be authorized once you  are discharged, as it is imperative that you return to your primary care physician (or establish a relationship with a primary care physician if you do not have one) for your aftercare needs so that they can reassess your need for medications and monitor your lab values.    Today   CHIEF COMPLAINT:   Chief Complaint  Patient presents with  . Weakness    HISTORY OF PRESENT ILLNESS:  Nastassia Bazaldua  is a 53 y.o. female with a known history of multiple medical problems that are mentioned below, who presented to the emergency room with a consult of nausea with generalized weakness and inability to walk with left lack swelling again erythema.  She denied any significant left leg pain.  She has been having worsening wounds of both heels.  She said that she did not urinate since yesterday and for about 24 hours.  She admitted to chills and and fever of 100.8 per EMS.  She denied any vomiting or diarrhea or melena or bright red bleeding per rectum.  No chest pain or dyspnea or palpitations.  No cough or wheezing or hemoptysis.  She was seen in urgent care for her heel wounds and was given Keflex and  despite that they have been getting worse as well as her left leg.  She has not been feeling much pain likely due to her chronic diabetic neuropathy.  She stated that she likely wore tight shoes that caused her heel ulcers.  When she came to the ER, she was tachycardic with a heart rate of 117 with otherwise normal vital signs.  EKG showed sinus tachycardia with a rate of 122 with inferior T wave inversion.  Labs were remarkable for BUN of 33 and creatinine of 3.09 with a potassium of 5.1 and albumin was 3.4.  Lactic acid was 1.3.  CBC showed leukocytosis of 12.8, anemia and neutrophilia.  Urinalysis showed specific gravity 1018 with glucose more than 506-10 RBCs and 0-5 WBCs.  Blood cultures were drawn as well as urine culture and sensitivity.  Portable chest x-ray showed right basilar atelectasis with no  other acute cardiopulmonary disease.  Bilateral feet x-ray showed soft tissue swelling and effective without osteomyelitis.  The patient was given IV cefepime and vancomycin as well as 4 mg of IV Zofran and IV normal saline.  She will be admitted to a medically monitored bed for further evaluation and management.   VITAL SIGNS:  Blood pressure (!) 176/98, pulse 88, temperature 97.6 F (36.4 C), temperature source Oral, resp. rate 20, height _0  (1.702 m), weight 121 kg, SpO2 97 %.  I/O:    Intake/Output Summary (Last 24 hours) at 10/21/2018 1154 Last data filed at 10/21/2018 1013 Gross per 24 hour  Intake 533.94 ml  Output -  Net 533.94 ml    PHYSICAL EXAMINATION:   GENERAL:  53 y.o.-year-old patient lying in the bed with no acute distress.  EYES: Pupils equal, round, reactive to light and accommodation. No scleral icterus. Extraocular muscles intact.  HEENT: Head atraumatic, normocephalic. Oropharynx and nasopharynx clear.  NECK:  Supple, no jugular venous distention. No thyroid enlargement, no tenderness.  LUNGS: Normal breath sounds bilaterally, no wheezing, rales, rhonchi. No use of accessory muscles of respiration.  CARDIOVASCULAR: S1, S2 normal. No murmurs, rubs, or gallops.  ABDOMEN: Soft, nontender, nondistended. Bowel sounds present. No organomegaly or mass.  EXTREMITIES: No cyanosis, clubbing or edema b/l.   Dressing in both feets. NEUROLOGIC: Cranial nerves II through XII are intact. No focal Motor or sensory deficits b/l.   PSYCHIATRIC: The patient is alert and oriented x 3.  SKIN: necrotic wounds bilateral heels  DATA REVIEW:   CBC Recent Labs  Lab 10/19/18 0435  WBC 8.5  HGB 9.7*  HCT 31.7*  PLT 267    Chemistries  Recent Labs  Lab 10/18/18 0024  10/21/18 0433  NA 133*   < > 134*  K 5.1   < > 4.9  CL 97*   < > 104  CO2 23   < > 22  GLUCOSE 298*   < > 276*  BUN 33*   < > 10  CREATININE 3.09*   < > 1.00  CALCIUM 9.0   < > 8.9  AST 15  --   --    ALT 18  --   --   ALKPHOS 105  --   --   BILITOT 0.7  --   --    < > = values in this interval not displayed.    Cardiac Enzymes No results for input(s): TROPONINI in the last 168 hours.  Microbiology Results  Results for orders placed or performed during the hospital encounter of 10/17/18  Blood Culture (routine x 2)  Status: None (Preliminary result)   Collection Time: 10/18/18 12:25 AM   Specimen: BLOOD  Result Value Ref Range Status   Specimen Description BLOOD LEFT ANTECUBITAL  Final   Special Requests   Final    BOTTLES DRAWN AEROBIC AND ANAEROBIC Blood Culture adequate volume   Culture   Final    NO GROWTH 3 DAYS Performed at [facility], [address], [place], [place] [address]    Report Status PENDING  Incomplete  Blood Culture (routine x 2)     Status: None (Preliminary result)   Collection Time: 10/18/18 12:25 AM   Specimen: BLOOD  Result Value Ref Range Status   Specimen Description BLOOD LEFT WRIST   Final   Special Requests   Final    BOTTLES DRAWN AEROBIC AND ANAEROBIC Blood Culture adequate volume   Culture   Final    NO GROWTH 3 DAYS Performed at [facility], [address] [place], [place] [address]    Report Status PENDING  Incomplete  Urine culture     Status: None   Collection Time: 10/18/18 12:25 AM   Specimen: Urine, Random  Result Value Ref Range Status   Specimen Description   Final    URINE, RANDOM Performed at [facility], [address] [place], [place] [address]    Special Requests   Final    Normal Performed at [facility], [address] [place], [place] [address]    Culture   Final    NO GROWTH Performed at [facility], [place] [address] [place], [place] [address]    Report Status 10/19/2018 FINAL  Final  SARS Coronavirus 2 (CEPHEID- Performed in [facility] hospital lab), Hosp Order     Status: None   Collection Time: 10/18/18 12:25 AM   Specimen: Urine, Catheterized;  Nasopharyngeal  Result Value Ref Range Status   SARS Coronavirus 2 NEGATIVE NEGATIVE Final    Comment: (NOTE) If result is NEGATIVE SARS-CoV-2 target nucleic acids are NOT DETECTED. The SARS-CoV-2 RNA is generally detectable in upper and lower  respiratory specimens during the acute phase of infection. The lowest  concentration of SARS-CoV-2 viral copies this assay can detect is 250  copies / mL. A negative result does not preclude SARS-CoV-2 infection  and should not be used as the sole basis for treatment or other  patient management decisions.  A negative result may occur with  improper specimen collection / handling, submission of specimen other  than nasopharyngeal swab, presence of viral mutation(s) within the  areas targeted by this assay, and inadequate number of viral copies  (<250 copies / mL). A negative result must be combined with clinical  observations, patient history, and epidemiological information. If result is POSITIVE SARS-CoV-2 target nucleic acids are DETECTED. The SARS-CoV-2 RNA is generally detectable in upper and lower  respiratory specimens dur ing the acute phase of infection.  Positive  results are indicative of active infection with SARS-CoV-2.  Clinical  correlation with patient history and other diagnostic information is  necessary to determine patient infection status.  Positive results do  not rule out bacterial infection or co-infection with other viruses. If result is PRESUMPTIVE POSTIVE SARS-CoV-2 nucleic acids MAY BE PRESENT.   A presumptive positive result was obtained on the submitted specimen  and confirmed on repeat testing.  While 2019 novel coronavirus  (SARS-CoV-2) nucleic acids may be present in the submitted sample  additional confirmatory testing may be necessary for epidemiological  and /  or clinical management purposes  to differentiate between  SARS-CoV-2 and other Sarbecovirus currently known to infect humans.  If clinically  indicated additional testing with an alternate test  methodology [phone]) is advised. The SARS-CoV-2 RNA is generally  detectable in upper and lower respiratory sp ecimens during the acute  phase of infection. The expected result is Negative. Fact Sheet for Patients:  StrictlyIdeas.no Fact Sheet for Healthcare Providers: BankingDealers.co.za This test is not yet approved or cleared by the [place] FDA and has been authorized for detection and/or diagnosis of SARS-CoV-2 by FDA under an Emergency Use Authorization (EUA).  This EUA will remain in effect (meaning this test can be used) for the duration of the COVID-19 declaration under Section 564(b)(1) of the Act, 21 U.S.C. section 360bbb-3(b)(1), unless the authorization is terminated or revoked sooner. Performed at [facility], [address], [place], [place] [address]     RADIOLOGY:  No results found.  EKG:   Orders placed or performed during the hospital encounter of 10/17/18  . EKG 12-Lead  . EKG 12-Lead      Management plans discussed with the patient, family and they are in agreement.  CODE STATUS:     Code Status Orders  (From admission, onward)         Start     Ordered   10/18/18 0302  Full code  Continuous     10/18/18 0302        Code Status History    Date Active Date Inactive Code Status Order ID Comments User Context   12/14/2017 0342 12/16/2017 1814 Full Code [id number]  Amelia Jo, MD Inpatient   02/24/2016 2104 02/27/2016 1407 Full Code [id number]  Theodoro Grist, MD Inpatient   Advance Care Planning Activity      TOTAL TIME TAKING CARE OF THIS PATIENT: 35 minutes.    [name] M.D on 10/21/2018 at 11:54 AM  Between 7am to 6pm - Pager - [phone]  After 6pm go to [email or web address] - password [email or web address] [place] [facility]  Office  [phone]  CC: Primary care physician; [name],  DO   Note: This dictation was prepared with Dragon dictation along with smaller phrase technology. Any transcriptional errors that result from this process are unintentional.

## 2018-10-21 NOTE — Plan of Care (Addendum)
Patient VSS through the night. Patient had no complaints of pain. Was able to use the room bathroom with standby assist long with a front walker. Wound dressings clean, dry, intact. Left AC PIV removed due to a clot in the tubing. Will continue to monitor.    Problem: Fluid Volume: Goal: Hemodynamic stability will improve Outcome: Progressing   Problem: Clinical Measurements: Goal: Diagnostic test results will improve Outcome: Progressing Goal: Signs and symptoms of infection will decrease Outcome: Progressing   Problem: Respiratory: Goal: Ability to maintain adequate ventilation will improve Outcome: Progressing

## 2018-10-22 ENCOUNTER — Telehealth: Payer: Self-pay

## 2018-10-22 ENCOUNTER — Ambulatory Visit (INDEPENDENT_AMBULATORY_CARE_PROVIDER_SITE_OTHER): Payer: Medicare Other | Admitting: *Deleted

## 2018-10-22 ENCOUNTER — Ambulatory Visit: Payer: Self-pay | Admitting: Pharmacist

## 2018-10-22 DIAGNOSIS — E1149 Type 2 diabetes mellitus with other diabetic neurological complication: Secondary | ICD-10-CM

## 2018-10-22 DIAGNOSIS — E1165 Type 2 diabetes mellitus with hyperglycemia: Secondary | ICD-10-CM

## 2018-10-22 DIAGNOSIS — IMO0002 Reserved for concepts with insufficient information to code with codable children: Secondary | ICD-10-CM

## 2018-10-22 DIAGNOSIS — E1169 Type 2 diabetes mellitus with other specified complication: Secondary | ICD-10-CM

## 2018-10-22 DIAGNOSIS — I10 Essential (primary) hypertension: Secondary | ICD-10-CM | POA: Diagnosis not present

## 2018-10-22 DIAGNOSIS — E785 Hyperlipidemia, unspecified: Secondary | ICD-10-CM | POA: Diagnosis not present

## 2018-10-22 NOTE — Chronic Care Management (AMB) (Signed)
  Care Management Note   [name] is a 53 y.o. year old female who is a primary care patient of [name], DO. I reached out to [employer] by phone today in response to a referral sent by Ms. Edwinna Areola Mceachron's health plan.    Outreach attempt # 1. Left a HIPAA compliant message on the patient's voicemail.    Follow Up Plan: The care management team will reach out to the patient again over the next 7 days.    Harlow Asa, PharmD, [place] [employer] [phone]

## 2018-10-22 NOTE — Telephone Encounter (Signed)
Transition Care Management Follow-up Telephone Call  Date of discharge and from where: 10/21/2018, [facility]   How have you been since you were released from the hospital? "Im doing okay today, im trying not to over do it today"  Any questions or concerns? No, would like to talk with CCM team regarding diabetic nutrition she is scheduled to speak witht he RN at 1:45pm today.  Items Reviewed:  Did the pt receive and understand the discharge instructions provided? Yes   Medications obtained and verified? Yes  still needs to pick up her abx and norco - states she will have someone get them today   Any new allergies since your discharge? No   Dietary orders reviewed? Yes  Do you have support at home? Yes   Functional Questionnaire: (I = Independent and D = Dependent) ADLs: i  Bathing/Dressing- I, using wash tub because she unable to get feet wet  Meal Prep- d  Eating- i  Maintaining continence- i  Transferring/Ambulation-  D, uses walker   Managing Meds-i  Follow up appointments reviewed:   PCP Hospital f/u appt confirmed? Yes  Scheduled to see Dr.Karamalegos on 10/26/2018 @ 10:40 via phone call.  [facility] f/u appt confirmed? No    Verified Chronic Care Management RN will call her today at 1:45pm.   Are transportation arrangements needed? No   If their condition worsens, is the pt aware to call PCP or go to the Emergency Dept.? Yes  Was the patient provided with contact information for the PCP's office or ED? Yes  Was to pt encouraged to call back with questions or concerns? Yes

## 2018-10-22 NOTE — Patient Instructions (Signed)
Thank you allowing the Chronic Care Management Team to be a part of your care! It was a pleasure speaking with you today!  CCM (Chronic Care Management) Team   Johnpatrick Jenny RN, BSN Nurse Care Coordinator  [phone]  Harlow Asa PharmD  Clinical Pharmacist  [phone]  [name] LCSW Clinical Social Worker [phone]  Goals Addressed            This Visit's Progress   . I want to get my diabetes back on track (pt-stated)       Current Barriers:  [name] Knowledge Deficits related to basic Diabetes pathophysiology and self care/management . Knowledge Deficits related to medications used for management of diabetes . Does not use cbg meter   Case Manager Clinical Goal(s):  Over the next 90 days, patient will demonstrate improved adherence to prescribed treatment plan for diabetes self care/management as evidenced by:  . daily monitoring and recording of CBG  . adherence to ADA/ carb modified diet . exercise 3 days/week . adherence to prescribed medication regimen  Interventions:  . Provided education to patient about basic DM disease process . Discussed plans with patient for ongoing care management follow up and provided patient with direct contact information for care management team . Provided patient with written educational materials related to hypo and hyperglycemia and importance of correct treatment . Reviewed scheduled/upcoming provider appointments including: Karamalegos 6/26 and Vickki Muff July 2  . Advised patient, providing education and rationale, to check cbg 3 times a day and record, calling PCP  for findings outside established parameters.    Patient Self Care Activities:  . UNABLE to independently self manage diabetes as evidenced by A1C >12  Initial goal documentation        The patient verbalized understanding of instructions provided today and declined a print copy of patient instruction materials.   The patient has been provided with  contact information for the care management team and has been advised to call with any health related questions or concerns.

## 2018-10-22 NOTE — Chronic Care Management (AMB) (Signed)
Chronic Care Management   Initial Visit Note  10/22/2018 Name: [name] MRN: [id number] DOB: 1966-02-03  Referred by: [name], DO Reason for referral : No chief complaint on file.   [name] is a 53 y.o. year old female who is a primary care patient of [name], DO. The CCM team was consulted for assistance with chronic disease management and care coordination needs.   Review of patient status, including review of consultants reports, relevant laboratory and other test results, and collaboration with appropriate care team members and the patient's provider was performed as part of comprehensive patient evaluation and provision of chronic care management services.    SDOH (Social Determinants of Health) screening performed today. See Care Plan Entry related to challenges with: Stress Physical Activity None   Spoke with patient at length today about multiple health issues. Patient states her main Otilio Carpen is to get her Diabetes back on track and hopefully lose some weight also. Patient agreeable to work with CCM team to achieve these goals.  Objective:   Goals Addressed            This Visit's Progress    I want to get my diabetes back on track (pt-stated)       Current Barriers:   Knowledge Deficits related to basic Diabetes pathophysiology and self care/management  Knowledge Deficits related to medications used for management of diabetes  Does not use cbg meter   Case Manager Clinical Goal(s):  Over the next 90 days, patient will demonstrate improved adherence to prescribed treatment plan for diabetes self care/management as evidenced by:   daily monitoring and recording of CBG   adherence to ADA/ carb modified diet  exercise 3 days/week  adherence to prescribed medication regimen  Interventions:   Provided education to patient about basic DM disease process  Discussed plans with patient for ongoing care management  follow up and provided patient with direct contact information for care management team  Provided patient with written educational materials related to hypo and hyperglycemia and importance of correct treatment  Reviewed scheduled/upcoming provider appointments including: [facility] 6/26 and [facility] July 2   Advised patient, providing education and rationale, to check cbg 3 times a day and record, calling PCP  for findings outside established parameters.    Pharmacy Referral   Social Work Referral   Patient Self Care Activities:   UNABLE to independently self manage diabetes as evidenced by A1C >12  Initial goal documentation         Ms. Kleiner was given information about Chronic Care Management services today including:  1. CCM service includes personalized support from designated clinical staff supervised by her physician, including individualized plan of care and coordination with other care providers 2. 24/7 contact phone numbers for assistance for urgent and routine care needs. 3. Service will only be billed when office clinical staff spend 20 minutes or more in a month to coordinate care. 4. Only one practitioner may furnish and bill the service in a calendar month. 5. The patient may stop CCM services at any time (effective at the end of the month) by phone call to the office staff. 6. The patient will be responsible for cost sharing (co-pay) of up to 20% of the service fee (after annual deductible is met).  Patient agreed to services and verbal consent obtained.   Plan:   The care management team will reach out to the patient again over the next 7 days.  The  patient has been provided with contact information for the care management team and has been advised to call with any health related questions or concerns.   [name] Rindi Beechy RN, BSN Nurse Case Editor, commissioning [facility]  [phone]) Business Mobile

## 2018-10-23 ENCOUNTER — Telehealth: Payer: Self-pay

## 2018-10-23 LAB — CULTURE, BLOOD (ROUTINE X 2)
Culture: NO GROWTH
Culture: NO GROWTH
Special Requests: ADEQUATE
Special Requests: ADEQUATE

## 2018-10-24 DIAGNOSIS — I10 Essential (primary) hypertension: Secondary | ICD-10-CM | POA: Diagnosis not present

## 2018-10-24 DIAGNOSIS — E1165 Type 2 diabetes mellitus with hyperglycemia: Secondary | ICD-10-CM | POA: Diagnosis not present

## 2018-10-24 DIAGNOSIS — E1142 Type 2 diabetes mellitus with diabetic polyneuropathy: Secondary | ICD-10-CM | POA: Diagnosis not present

## 2018-10-24 DIAGNOSIS — Z87891 Personal history of nicotine dependence: Secondary | ICD-10-CM | POA: Diagnosis not present

## 2018-10-24 DIAGNOSIS — Z794 Long term (current) use of insulin: Secondary | ICD-10-CM | POA: Diagnosis not present

## 2018-10-24 DIAGNOSIS — E11621 Type 2 diabetes mellitus with foot ulcer: Secondary | ICD-10-CM | POA: Diagnosis not present

## 2018-10-24 DIAGNOSIS — L97422 Non-pressure chronic ulcer of left heel and midfoot with fat layer exposed: Secondary | ICD-10-CM | POA: Diagnosis not present

## 2018-10-24 DIAGNOSIS — E785 Hyperlipidemia, unspecified: Secondary | ICD-10-CM | POA: Diagnosis not present

## 2018-10-24 DIAGNOSIS — G709 Myoneural disorder, unspecified: Secondary | ICD-10-CM | POA: Diagnosis not present

## 2018-10-24 DIAGNOSIS — L03116 Cellulitis of left lower limb: Secondary | ICD-10-CM | POA: Diagnosis not present

## 2018-10-24 DIAGNOSIS — L97412 Non-pressure chronic ulcer of right heel and midfoot with fat layer exposed: Secondary | ICD-10-CM | POA: Diagnosis not present

## 2018-10-25 DIAGNOSIS — L97422 Non-pressure chronic ulcer of left heel and midfoot with fat layer exposed: Secondary | ICD-10-CM | POA: Diagnosis not present

## 2018-10-25 DIAGNOSIS — G709 Myoneural disorder, unspecified: Secondary | ICD-10-CM | POA: Diagnosis not present

## 2018-10-25 DIAGNOSIS — Z794 Long term (current) use of insulin: Secondary | ICD-10-CM | POA: Diagnosis not present

## 2018-10-25 DIAGNOSIS — L97412 Non-pressure chronic ulcer of right heel and midfoot with fat layer exposed: Secondary | ICD-10-CM | POA: Diagnosis not present

## 2018-10-25 DIAGNOSIS — E785 Hyperlipidemia, unspecified: Secondary | ICD-10-CM | POA: Diagnosis not present

## 2018-10-25 DIAGNOSIS — E1165 Type 2 diabetes mellitus with hyperglycemia: Secondary | ICD-10-CM | POA: Diagnosis not present

## 2018-10-25 DIAGNOSIS — Z87891 Personal history of nicotine dependence: Secondary | ICD-10-CM | POA: Diagnosis not present

## 2018-10-25 DIAGNOSIS — I10 Essential (primary) hypertension: Secondary | ICD-10-CM | POA: Diagnosis not present

## 2018-10-25 DIAGNOSIS — E1142 Type 2 diabetes mellitus with diabetic polyneuropathy: Secondary | ICD-10-CM | POA: Diagnosis not present

## 2018-10-25 DIAGNOSIS — E11621 Type 2 diabetes mellitus with foot ulcer: Secondary | ICD-10-CM | POA: Diagnosis not present

## 2018-10-25 DIAGNOSIS — L03116 Cellulitis of left lower limb: Secondary | ICD-10-CM | POA: Diagnosis not present

## 2018-10-26 ENCOUNTER — Ambulatory Visit (INDEPENDENT_AMBULATORY_CARE_PROVIDER_SITE_OTHER): Payer: Medicare Other | Admitting: Family Medicine

## 2018-10-26 ENCOUNTER — Other Ambulatory Visit: Payer: Self-pay

## 2018-10-26 ENCOUNTER — Encounter: Payer: Self-pay | Admitting: Family Medicine

## 2018-10-26 ENCOUNTER — Ambulatory Visit: Payer: Self-pay | Admitting: Pharmacist

## 2018-10-26 VITALS — BP 139/63 | HR 79 | Temp 98.1°F | Ht 67.0 in | Wt 273.0 lb

## 2018-10-26 DIAGNOSIS — I10 Essential (primary) hypertension: Secondary | ICD-10-CM | POA: Diagnosis not present

## 2018-10-26 DIAGNOSIS — E785 Hyperlipidemia, unspecified: Secondary | ICD-10-CM

## 2018-10-26 DIAGNOSIS — Z794 Long term (current) use of insulin: Secondary | ICD-10-CM | POA: Diagnosis not present

## 2018-10-26 DIAGNOSIS — L97412 Non-pressure chronic ulcer of right heel and midfoot with fat layer exposed: Secondary | ICD-10-CM | POA: Diagnosis not present

## 2018-10-26 DIAGNOSIS — E1149 Type 2 diabetes mellitus with other diabetic neurological complication: Secondary | ICD-10-CM | POA: Diagnosis not present

## 2018-10-26 DIAGNOSIS — E11621 Type 2 diabetes mellitus with foot ulcer: Secondary | ICD-10-CM

## 2018-10-26 DIAGNOSIS — E1169 Type 2 diabetes mellitus with other specified complication: Secondary | ICD-10-CM

## 2018-10-26 DIAGNOSIS — E1165 Type 2 diabetes mellitus with hyperglycemia: Secondary | ICD-10-CM

## 2018-10-26 DIAGNOSIS — IMO0002 Reserved for concepts with insufficient information to code with codable children: Secondary | ICD-10-CM

## 2018-10-26 DIAGNOSIS — G709 Myoneural disorder, unspecified: Secondary | ICD-10-CM | POA: Diagnosis not present

## 2018-10-26 DIAGNOSIS — L97422 Non-pressure chronic ulcer of left heel and midfoot with fat layer exposed: Secondary | ICD-10-CM | POA: Diagnosis not present

## 2018-10-26 DIAGNOSIS — Z87891 Personal history of nicotine dependence: Secondary | ICD-10-CM | POA: Diagnosis not present

## 2018-10-26 DIAGNOSIS — L97418 Non-pressure chronic ulcer of right heel and midfoot with other specified severity: Secondary | ICD-10-CM

## 2018-10-26 DIAGNOSIS — E1142 Type 2 diabetes mellitus with diabetic polyneuropathy: Secondary | ICD-10-CM | POA: Diagnosis not present

## 2018-10-26 DIAGNOSIS — L03116 Cellulitis of left lower limb: Secondary | ICD-10-CM | POA: Diagnosis not present

## 2018-10-26 NOTE — Progress Notes (Signed)
Subjective:    Patient ID: [name], female    DOB: November 08, 1965, 53 y.o.   MRN: [id number]  [name] is a 53 y.o. female presenting on 10/26/2018 for Hospitalization Follow-up (Left lower extremity cellulitis with bilateral heel infection, sepsis )   HPI  HOSPITAL FOLLOW-UP VISIT  Hospital/Location: [facility] Date of Admission: 10/17/18 Date of Discharge: 10/21/18 Transitions of care telephone call: Completed by Tyler Aas LPN on 1/51/76  Additional home support - contacted by Chronic Care Management team Nurse CM and Pharmacy on 10/22/18  Reason for Admission: Sepsis, foot infection cellulitis Primary (+Secondary) Diagnosis: multiple diabetic wounds ulcer foot/leg, sepsis cellulitis  - Hospital H&P and Discharge Summary have been reviewed - Patient presents today 5 days  after recent hospitalization. Brief summary of recent course, patient had symptoms of worsening diabetic wounds on bilateral lower extremity, hospitalized, after [facility] with [facility] was ineffective, treated with IV vancomycin and cefepime antibiotics, had wound care consultation, debridement of heel ulcers and to follow with Podiatry Dr Vickki Muff next week 7/2 discharged on doxycycline for 10 days on discharge.  Discharged to home with home health therapy. Has DME walker as well.  - Today reports overall has done well after discharge. Symptoms of foot ulcers have improved, still with wound care at home. Resolved fevers chills. Admits some drainage from wound. - New medications on discharge: Doxycycline - Changes to current meds on discharge: none  I have reviewed the discharge medication list, and have reconciled the current and discharge medications today.   Current Outpatient Medications:  .  ARIPiprazole (ABILIFY) 10 MG tablet, Take 10 mg by mouth daily., Disp: , Rfl:  .  B-D UF III MINI PEN NEEDLES 31G X 5 MM MISC, USE AS DIRECTED, Disp: 100 each, Rfl: 3 .  Blood Glucose  Monitoring Suppl (ONE TOUCH ULTRA SYSTEM KIT) w/Device KIT, 1 kit by Does not apply route once., Disp: 1 each, Rfl: 0 .  doxycycline (VIBRAMYCIN) 50 MG capsule, Take 2 capsules (100 mg total) by mouth 2 (two) times daily for 10 days., Disp: 40 capsule, Rfl: 0 .  HUMALOG KWIKPEN 100 UNIT/ML KwikPen, Inject 0.05 mLs (5 Units total) into the skin 3 (three) times daily with meals. + Sliding scale, Disp: 15 mL, Rfl: 5 .  Insulin Glargine (LANTUS SOLOSTAR) 100 UNIT/ML Solostar Pen, Inject 50 Units into the skin daily. Adjust dose as advised (Patient taking differently: Inject 52 Units into the skin daily. ), Disp: 15 mL, Rfl: 1 .  lisinopril (PRINIVIL,ZESTRIL) 20 MG tablet, TAKE 1 TABLET(20 MG) BY MOUTH DAILY (Patient taking differently: Take 20 mg by mouth daily. ), Disp: 90 tablet, Rfl: 1 .  metFORMIN (GLUCOPHAGE) 1000 MG tablet, TAKE 1 TABLET(1000 MG) BY MOUTH TWICE DAILY WITH A MEAL, Disp: 180 tablet, Rfl: 1 .  ONETOUCH VERIO test strip, Check blood sugar up to 3 x daily as needed, Disp: 100 each, Rfl: 11 .  PARoxetine (PAXIL) 20 MG tablet, Take 1 tablet (20 mg total) by mouth daily. Prescribed by [facility], Disp: 30 tablet, Rfl: 0 .  sulfamethoxazole-trimethoprim (BACTRIM DS) 800-160 MG tablet, TK 1 T PO Q 12 H, Disp: , Rfl:  .  acetaminophen (TYLENOL) 325 MG tablet, Take 2 tablets (650 mg total) by mouth every 6 (six) hours as needed for mild pain (or Fever >/= 101). (Patient not taking: Reported on 10/26/2018), Disp: , Rfl:   ------------------------------------------------------------------------- Social History   Tobacco Use  . Smoking status: Former Smoker  Packs/day: 3.00    Years: 30.00    Pack years: 90.00    Types: Cigarettes    Quit date: 05/02/2009    Years since quitting: 9.4  . Smokeless tobacco: Former Systems developer  . Tobacco comment: Pt reported  quitting in 2011  Substance Use Topics  . Alcohol use: No  . Drug use: No    Review of Systems Per HPI unless specifically indicated  above     Objective:    BP 139/63 (BP Location: Left Arm, Patient Position: Sitting, Cuff Size: Large)   Pulse 79   Temp 98.1 F (36.7 C) (Oral)   Ht 5' 7" (1.702 m)   Wt 273 lb (123.8 kg)   BMI 42.76 kg/m   Wt Readings from Last 3 Encounters:  10/26/18 273 lb (123.8 kg)  10/20/18 266 lb 12.1 oz (121 kg)  04/30/18 259 lb (117.5 kg)    Physical Exam Vitals signs and nursing note reviewed.  Constitutional:      General: She is not in acute distress.    Appearance: She is well-developed. She is not diaphoretic.     Comments: Well-appearing, comfortable, cooperative, obese  HENT:     Head: Normocephalic and atraumatic.  Eyes:     General:        Right eye: No discharge.        Left eye: No discharge.     Conjunctiva/sclera: Conjunctivae normal.  Neck:     Musculoskeletal: Normal range of motion and neck supple.     Thyroid: No thyromegaly.  Cardiovascular:     Rate and Rhythm: Normal rate and regular rhythm.     Heart sounds: Normal heart sounds. No murmur.  Pulmonary:     Effort: Pulmonary effort is normal. No respiratory distress.     Breath sounds: Normal breath sounds. No wheezing or rales.  Musculoskeletal: Normal range of motion.     Comments: Using rolling walker.  Lymphadenopathy:     Cervical: No cervical adenopathy.  Skin:    General: Skin is warm and dry.     Findings: No erythema or rash.     Comments: She declines to unwrap dressing on feet today.  Neurological:     Mental Status: She is alert and oriented to person, place, and time.  Psychiatric:        Behavior: Behavior normal.     Comments: Well groomed, good eye contact, normal speech and thoughts        Results for orders placed or performed during the hospital encounter of 10/17/18  Blood Culture (routine x 2)   Specimen: BLOOD  Result Value Ref Range   Specimen Description BLOOD LEFT ANTECUBITAL    Special Requests      BOTTLES DRAWN AEROBIC AND ANAEROBIC Blood Culture adequate volume    Culture      NO GROWTH 5 DAYS Performed at [facility], [address], [place], [place] [address]    Report Status 10/23/2018 FINAL   Blood Culture (routine x 2)   Specimen: BLOOD  Result Value Ref Range   Specimen Description BLOOD LEFT WRIST     Special Requests      BOTTLES DRAWN AEROBIC AND ANAEROBIC Blood Culture adequate volume   Culture      NO GROWTH 5 DAYS Performed at [facility], [address] [place], [place] [address]    Report Status 10/23/2018 FINAL   Urine culture   Specimen: Urine, Random  Result Value Ref Range  Specimen Description      URINE, RANDOM Performed at [facility], [address] [place], [place] [address]    Special Requests      Normal Performed at [facility], [address] [place], [place] [address]    Culture      NO GROWTH Performed at [facility], [place] [address], [place], [place] [address]    Report Status 10/19/2018 FINAL   SARS Coronavirus 2 (CEPHEID- Performed in [facility] hospital lab), [facility] Order   Specimen: Urine, Catheterized; Nasopharyngeal  Result Value Ref Range   SARS Coronavirus 2 NEGATIVE NEGATIVE  Lactic acid, plasma  Result Value Ref Range   Lactic Acid, Venous 1.3 0.5 - 1.9 mmol/L  Comprehensive metabolic panel  Result Value Ref Range   Sodium 133 (L) 135 - 145 mmol/L   Potassium 5.1 3.5 - 5.1 mmol/L   Chloride 97 (L) 98 - 111 mmol/L   CO2 23 22 - 32 mmol/L   Glucose, Bld 298 (H) 70 - 99 mg/dL   BUN 33 (H) 6 - 20 mg/dL   Creatinine, Ser 3.09 (H) 0.44 - 1.00 mg/dL   Calcium 9.0 8.9 - 10.3 mg/dL   Total Protein 7.3 6.5 - 8.1 g/dL   Albumin 3.4 (L) 3.5 - 5.0 g/dL   AST 15 15 - 41 U/L   ALT 18 0 - 44 U/L   Alkaline Phosphatase 105 38 - 126 U/L   Total Bilirubin 0.7 0.3 - 1.2 mg/dL   GFR calc non Af Amer 16 (L) >60 mL/min   GFR calc Af Amer 19 (L) >60 mL/min   Anion gap 13 5 - 15  Lipase, blood  Result Value Ref Range   Lipase 26 11 - 51 U/L  Brain  natriuretic peptide - IF patient is dyspneic  Result Value Ref Range   B Natriuretic Peptide 21.0 0.0 - 100.0 pg/mL  CBC WITH DIFFERENTIAL  Result Value Ref Range   WBC 12.8 (H) 4.0 - 10.5 K/uL   RBC 3.93 3.87 - 5.11 MIL/uL   Hemoglobin 10.3 (L) 12.0 - 15.0 g/dL   HCT 32.9 (L) 36.0 - 46.0 %   MCV 83.7 80.0 - 100.0 fL   MCH 26.2 26.0 - 34.0 pg   MCHC 31.3 30.0 - 36.0 g/dL   RDW 16.4 (H) 11.5 - 15.5 %   Platelets 291 150 - 400 K/uL   nRBC 0.0 0.0 - 0.2 %   Neutrophils Relative % 64 %   Neutro Abs 8.3 (H) 1.7 - 7.7 K/uL   Lymphocytes Relative 23 %   Lymphs Abs 3.0 0.7 - 4.0 K/uL   Monocytes Relative 9 %   Monocytes Absolute 1.1 (H) 0.1 - 1.0 K/uL   Eosinophils Relative 2 %   Eosinophils Absolute 0.2 0.0 - 0.5 K/uL   Basophils Relative 1 %   Basophils Absolute 0.1 0.0 - 0.1 K/uL   Immature Granulocytes 1 %   Abs Immature Granulocytes 0.07 0.00 - 0.07 K/uL  Procalcitonin  Result Value Ref Range   Procalcitonin 0.16 ng/mL  Protime-INR  Result Value Ref Range   Prothrombin Time 13.7 11.4 - 15.2 seconds   INR 1.1 0.8 - 1.2  Urinalysis, Complete w Microscopic  Result Value Ref Range   Color, Urine AMBER (A) YELLOW   APPearance CLOUDY (A) CLEAR   Specific Gravity, Urine 1.018 1.005 - 1.030   pH 5.0 5.0 - 8.0   Glucose, UA >=500 (A) NEGATIVE mg/dL   Hgb  urine dipstick SMALL (A) NEGATIVE   Bilirubin Urine NEGATIVE NEGATIVE   Ketones, ur NEGATIVE NEGATIVE mg/dL   Protein, ur 30 (A) NEGATIVE mg/dL   Nitrite NEGATIVE NEGATIVE   Leukocytes,Ua NEGATIVE NEGATIVE   RBC / HPF 6-10 0 - 5 RBC/hpf   WBC, UA 0-5 0 - 5 WBC/hpf   Bacteria, UA RARE (A) NONE SEEN   Squamous Epithelial / LPF 0-5 0 - 5   Mucus PRESENT    Hyaline Casts, UA PRESENT   Vancomycin, random  Result Value Ref Range   Vancomycin Rm 20   Basic metabolic panel  Result Value Ref Range   Sodium 133 (L) 135 - 145 mmol/L   Potassium 4.9 3.5 - 5.1 mmol/L   Chloride 103 98 - 111 mmol/L   CO2 21 (L) 22 - 32 mmol/L    Glucose, Bld 277 (H) 70 - 99 mg/dL   BUN 30 (H) 6 - 20 mg/dL   Creatinine, Ser 2.48 (H) 0.44 - 1.00 mg/dL   Calcium 8.4 (L) 8.9 - 10.3 mg/dL   GFR calc non Af Amer 21 (L) >60 mL/min   GFR calc Af Amer 25 (L) >60 mL/min   Anion gap 9 5 - 15  CBC  Result Value Ref Range   WBC 11.1 (H) 4.0 - 10.5 K/uL   RBC 3.60 (L) 3.87 - 5.11 MIL/uL   Hemoglobin 9.4 (L) 12.0 - 15.0 g/dL   HCT 30.3 (L) 36.0 - 46.0 %   MCV 84.2 80.0 - 100.0 fL   MCH 26.1 26.0 - 34.0 pg   MCHC 31.0 30.0 - 36.0 g/dL   RDW 16.4 (H) 11.5 - 15.5 %   Platelets 264 150 - 400 K/uL   nRBC 0.0 0.0 - 0.2 %  Hemoglobin A1c  Result Value Ref Range   Hgb A1c MFr Bld 12.9 (H) 4.8 - 5.6 %   Mean Plasma Glucose 323.53 mg/dL  Glucose, capillary  Result Value Ref Range   Glucose-Capillary 253 (H) 70 - 99 mg/dL  Glucose, capillary  Result Value Ref Range   Glucose-Capillary 304 (H) 70 - 99 mg/dL  CBC  Result Value Ref Range   WBC 8.5 4.0 - 10.5 K/uL   RBC 3.76 (L) 3.87 - 5.11 MIL/uL   Hemoglobin 9.7 (L) 12.0 - 15.0 g/dL   HCT 31.7 (L) 36.0 - 46.0 %   MCV 84.3 80.0 - 100.0 fL   MCH 25.8 (L) 26.0 - 34.0 pg   MCHC 30.6 30.0 - 36.0 g/dL   RDW 16.1 (H) 11.5 - 15.5 %   Platelets 267 150 - 400 K/uL   nRBC 0.0 0.0 - 0.2 %  Basic metabolic panel  Result Value Ref Range   Sodium 136 135 - 145 mmol/L   Potassium 4.9 3.5 - 5.1 mmol/L   Chloride 107 98 - 111 mmol/L   CO2 23 22 - 32 mmol/L   Glucose, Bld 295 (H) 70 - 99 mg/dL   BUN 21 (H) 6 - 20 mg/dL   Creatinine, Ser 1.26 (H) 0.44 - 1.00 mg/dL   Calcium 8.7 (L) 8.9 - 10.3 mg/dL   GFR calc non Af Amer 48 (L) >60 mL/min   GFR calc Af Amer 56 (L) >60 mL/min   Anion gap 6 5 - 15  Glucose, capillary  Result Value Ref Range   Glucose-Capillary 249 (H) 70 - 99 mg/dL  Glucose, capillary  Result Value Ref Range   Glucose-Capillary 249 (H) 70 - 99 mg/dL  Glucose, capillary  Result Value Ref Range   Glucose-Capillary 231 (H) 70 - 99 mg/dL  Glucose, capillary  Result Value Ref Range    Glucose-Capillary 247 (H) 70 - 99 mg/dL  Glucose, capillary  Result Value Ref Range   Glucose-Capillary 313 (H) 70 - 99 mg/dL  Basic metabolic panel  Result Value Ref Range   Sodium 138 135 - 145 mmol/L   Potassium 5.7 (H) 3.5 - 5.1 mmol/L   Chloride 108 98 - 111 mmol/L   CO2 23 22 - 32 mmol/L   Glucose, Bld 216 (H) 70 - 99 mg/dL   BUN 12 6 - 20 mg/dL   Creatinine, Ser 1.11 (H) 0.44 - 1.00 mg/dL   Calcium 8.8 (L) 8.9 - 10.3 mg/dL   GFR calc non Af Amer 56 (L) >60 mL/min   GFR calc Af Amer >60 >60 mL/min   Anion gap 7 5 - 15  Glucose, capillary  Result Value Ref Range   Glucose-Capillary 224 (H) 70 - 99 mg/dL  Glucose, capillary  Result Value Ref Range   Glucose-Capillary 199 (H) 70 - 99 mg/dL  Glucose, capillary  Result Value Ref Range   Glucose-Capillary 232 (H) 70 - 99 mg/dL  Potassium  Result Value Ref Range   Potassium 5.1 3.5 - 5.1 mmol/L  Glucose, capillary  Result Value Ref Range   Glucose-Capillary 302 (H) 70 - 99 mg/dL  Basic metabolic panel  Result Value Ref Range   Sodium 134 (L) 135 - 145 mmol/L   Potassium 4.9 3.5 - 5.1 mmol/L   Chloride 104 98 - 111 mmol/L   CO2 22 22 - 32 mmol/L   Glucose, Bld 276 (H) 70 - 99 mg/dL   BUN 10 6 - 20 mg/dL   Creatinine, Ser 1.00 0.44 - 1.00 mg/dL   Calcium 8.9 8.9 - 10.3 mg/dL   GFR calc non Af Amer >60 >60 mL/min   GFR calc Af Amer >60 >60 mL/min   Anion gap 8 5 - 15  Glucose, capillary  Result Value Ref Range   Glucose-Capillary 257 (H) 70 - 99 mg/dL  Glucose, capillary  Result Value Ref Range   Glucose-Capillary 240 (H) 70 - 99 mg/dL  Glucose, capillary  Result Value Ref Range   Glucose-Capillary 300 (H) 70 - 99 mg/dL      Assessment & Plan:   Problem List Items Addressed This Visit    DM (diabetes mellitus), type 2, 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[place]) - Primary  Uncontrolled DM with hyperglycemia, A1c >12 last time in hospital 10/2018 Followed by [facility] - poorly controlled due to  variety of issues with non adherence No hypoglycemia. Complications - peripheral neuropathy, hyperlipidemia depression, obesity, - increases risk of future cardiovascular complications   Plan:  1. Continue current therapy - Lantus insulin and Humalog mealtime, Metformin - she should contact [facility] today again to follow-up schedule, and determine if can add back GLP1 agent or other options in near future - she was better controlled on Trulicity before but had intolerance nausea  2. Encourage improved lifestyle - low carb, low sugar diet, reduce portion size, RESTART regular exercise 3. Check CBG, bring log to next visit for review 4. Continue ACEi 5. Needs diabetic shoes in future, she was given info to go to [facility] supply when ulcers heal and then return here for completing forms and placing order 6. Follow-up  Already referred on discharge from hospital to Chronic Care Management and Pharmacy, looks like some limited contact so far, advised  her to stay tuned to get in touch with them.      Other Visit Diagnoses    Diabetic ulcer of right heel associated with type 2 diabetes mellitus, with other ulcer severity ([place])      Sepsis (RESOLVED)  Improving chronic diabetic foot ulcers, now with treated cellulitis infection, sepsis Tolerating PO antibiotics Doxycycline Bactrim with improvement - finish current course as prescribed Continue home health wound care currently with recommend using non adherent pad and topical antibiotic ointment or barrier and wrapping and dressing changes  Follow up with Dr Vickki Muff podiatry on 7/2 for further debridement of wounds  Strict return criteria if any worsening     Additionally handicap placard form completed for patient.   No orders of the defined types were placed in this encounter.   Follow up plan: Return in about 3 months (around 01/26/2019) for diabetes.   [name], [facility] 10/26/2018, 11:05 AM

## 2018-10-26 NOTE — Patient Instructions (Addendum)
Thank you for coming to the office today.  Once feet heal - then go to [facility] for fitting. And then come back here to office for orders on diabetic shoes  [address] [place], [place] [address] Open until [place] Phone: [phone]  Also need to call Hillary at [facility] Endocrinology to schedule diabetes doctor visit  - Ask them about restarting Trulicity or other medicines, for sugar control  Stay tuned for phone call from [place] - from [facility] she can help with diabetes.  If worsening foot infection redness drainage fever, can contact us  If not improving you may need to return for re-evaluation. But if more severe worsening such as spreading redness or streaking redness, significantly larger size, persistent drainage of pus, increased pain, fevers/chills, nausea vomiting and cannot take antibiotic. If significantly worse symptoms or most of these symptoms, would recommend going straight to Hospital Emergency Dept as you may require IV antibiotics instead.   Please schedule a Follow-up Appointment to: Return in about 3 months (around 01/26/2019) for diabetes.  If you have any other questions or concerns, please feel free to call the office or send a message through [employer]. You may also schedule an earlier appointment if necessary.  Additionally, you may be receiving a survey about your experience at our office within a few days to 1 week by e-mail or mail. We value your feedback.  [name], DO [place]

## 2018-10-29 ENCOUNTER — Telehealth: Payer: Self-pay | Admitting: Family Medicine

## 2018-10-29 ENCOUNTER — Ambulatory Visit: Payer: Self-pay | Admitting: *Deleted

## 2018-10-29 ENCOUNTER — Ambulatory Visit: Payer: Self-pay | Admitting: Pharmacist

## 2018-10-29 DIAGNOSIS — Z87891 Personal history of nicotine dependence: Secondary | ICD-10-CM | POA: Diagnosis not present

## 2018-10-29 DIAGNOSIS — L97422 Non-pressure chronic ulcer of left heel and midfoot with fat layer exposed: Secondary | ICD-10-CM | POA: Diagnosis not present

## 2018-10-29 DIAGNOSIS — E785 Hyperlipidemia, unspecified: Secondary | ICD-10-CM | POA: Diagnosis not present

## 2018-10-29 DIAGNOSIS — E1149 Type 2 diabetes mellitus with other diabetic neurological complication: Secondary | ICD-10-CM

## 2018-10-29 DIAGNOSIS — IMO0002 Reserved for concepts with insufficient information to code with codable children: Secondary | ICD-10-CM

## 2018-10-29 DIAGNOSIS — L97412 Non-pressure chronic ulcer of right heel and midfoot with fat layer exposed: Secondary | ICD-10-CM | POA: Diagnosis not present

## 2018-10-29 DIAGNOSIS — G709 Myoneural disorder, unspecified: Secondary | ICD-10-CM | POA: Diagnosis not present

## 2018-10-29 DIAGNOSIS — E1142 Type 2 diabetes mellitus with diabetic polyneuropathy: Secondary | ICD-10-CM | POA: Diagnosis not present

## 2018-10-29 DIAGNOSIS — E11621 Type 2 diabetes mellitus with foot ulcer: Secondary | ICD-10-CM | POA: Diagnosis not present

## 2018-10-29 DIAGNOSIS — E1165 Type 2 diabetes mellitus with hyperglycemia: Secondary | ICD-10-CM | POA: Diagnosis not present

## 2018-10-29 DIAGNOSIS — Z794 Long term (current) use of insulin: Secondary | ICD-10-CM | POA: Diagnosis not present

## 2018-10-29 DIAGNOSIS — L03116 Cellulitis of left lower limb: Secondary | ICD-10-CM | POA: Diagnosis not present

## 2018-10-29 DIAGNOSIS — I10 Essential (primary) hypertension: Secondary | ICD-10-CM | POA: Diagnosis not present

## 2018-10-29 MED ORDER — ONETOUCH VERIO VI STRP: ORAL_STRIP | 11 refills | Status: DC

## 2018-10-29 MED ORDER — ONETOUCH ULTRASOFT LANCETS MISC: 12 refills | Status: DC

## 2018-10-29 MED ORDER — ONETOUCH VERIO W/DEVICE KIT: PACK | 0 refills | Status: DC

## 2018-10-29 NOTE — Chronic Care Management (AMB) (Signed)
  Chronic Care Management   Follow Up Note   10/29/2018 Name: [name] MRN: [id number] DOB: 06/05/1965  Referred by: [name], DO Reason for referral : Chronic Care Management (Patient Phone Call)   [name] is a 53 y.o. year old female who is a primary care patient of [name], DO. The CCM team was consulted for assistance with chronic disease management and care coordination needs.  [name] has a past medical history including but not limited to type 2 diabetes, hypertension, hyperlipidemia and recent hospitalization for left lower extremity cellulitis with bilateral heel infection, sepsis (10/2018).   Missed incoming call from [name] today.  Return call, but was unable to reach patient via telephone and have left HIPAA compliant voicemail asking patient to return my call.      The care management team will reach out to the patient again over the next 7 days.   Harlow Asa, PharmD, [facility] [employer] [phone]

## 2018-10-29 NOTE — Telephone Encounter (Signed)
Sent rx for [employer] and supplies.  Check cbg 3 x daily  Also will respond with recommendation to continue atorvastatin as prescribed.  [name], [facility] 10/29/2018, 6:03 PM

## 2018-10-29 NOTE — Telephone Encounter (Signed)
-----   Message from Vella Raring, [facility] sent at 10/29/2018  5:01 PM EDT ----- Dr. Parks Ranger,  I had the opportunity to perform a medication review with Ms. Kabat on Friday.   Ms. Marshman reported needing a new blood sugar meter. Would you please send a prescription for the One Touch Verio monitor and supplies into the [facility]?  Also, I noted that per the discharge summary from 10/21/18, discharging physician noted "continue statin medication" under plan for hyperlipidemia, but per document, patient instructed to stop atorvastatin 40 mg and medication removed from med list in chart. I did not see the reason for this medication to be discontinued. Would you please advise?  Thank you!  [place] Ormond

## 2018-10-29 NOTE — Chronic Care Management (AMB) (Signed)
Chronic Care Management   Note  10/29/2018 Name: [name] MRN: [id number] DOB: Jul 08, 1965   Subjective:   [name] is a 53 y.o. year old female who is a primary care patient of Gabriela Hauser, DO. Referral sent by [name]'s health plan.  [name] has a past medical history including but not limited to type 2 diabetes, hypertension, hyperlipidemia and recent hospitalization for left lower extremity cellulitis with bilateral heel infection, sepsis (10/2018).   Outreach to [name] today by phone.  Review of patient status, including review of consultants reports, laboratory and other test data, was performed as part of comprehensive evaluation and provision of chronic care management services.   Objective:  Lab Results  Component Value Date   CREATININE 1.00 10/21/2018   CREATININE 1.11 (H) 10/20/2018   CREATININE 1.26 (H) 10/19/2018    Lab Results  Component Value Date   HGBA1C 12.9 (H) 10/18/2018       Component Value Date/Time   CHOL 224 (H) 11/07/2017 0925   TRIG 215 (H) 11/07/2017 0925   HDL 53 11/07/2017 0925   CHOLHDL 4.2 11/07/2017 0925   VLDL 53 (H) 02/25/2016 0431   LDLCALC 135 (H) 11/07/2017 0925    BP Readings from Last 3 Encounters:  10/26/18 139/63  10/21/18 (!) 176/98  04/30/18 113/67    No Known Allergies  Medications Reviewed Today    Reviewed by [name], [place] (Pharmacist) on 10/26/18 at 1608  Med List Status: <None>  Medication Order Taking? Sig Documenting Provider Last Dose Status Informant  acetaminophen (TYLENOL) 325 MG tablet [id number] No Take 2 tablets (650 mg total) by mouth every 6 (six) hours as needed for mild pain (or Fever >/= 101).  Patient not taking: Reported on 10/26/2018   Gabriela Mango, MD Not Taking Active   ARIPiprazole (ABILIFY) 10 MG tablet [id number] Yes Take 10 mg by mouth daily. [provider] Taking Active Self  B-D UF III MINI PEN NEEDLES 31G X 5  MM MISC [id number]  USE AS DIRECTED Parks Ranger, Devonne Doughty, DO  Active   Blood Glucose Monitoring Suppl (ONE TOUCH ULTRA SYSTEM KIT) w/Device KIT [id number]  1 kit by Does not apply route once. Arlis Porta., MD  Active Self  doxycycline (VIBRAMYCIN) 50 MG capsule [id number] Yes Take 2 capsules (100 mg total) by mouth 2 (two) times daily for 10 days. Saundra Shelling, MD Taking Active   HUMALOG KWIKPEN 100 UNIT/ML KwikPen [id number] Yes Inject 0.05 mLs (5 Units total) into the skin 3 (three) times daily with meals. + Sliding scale Karamalegos, Devonne Doughty, DO Taking Active   Insulin Glargine (LANTUS SOLOSTAR) 100 UNIT/ML Solostar Pen [id number] Yes Inject 50 Units into the skin daily. Adjust dose as advised  Patient taking differently: Inject 52 Units into the skin daily.    Gabriela Hauser, DO Taking Active   lisinopril (PRINIVIL,ZESTRIL) 20 MG tablet [id number] Yes TAKE 1 TABLET(20 MG) BY MOUTH DAILY  Patient taking differently: Take 20 mg by mouth daily.    Gabriela Hauser, DO Taking Active   metFORMIN (GLUCOPHAGE) 1000 MG tablet [id number] Yes TAKE 1 TABLET(1000 MG) BY MOUTH TWICE DAILY WITH A MEAL Karamalegos, Devonne Doughty, DO Taking Active   ONETOUCH VERIO test strip [id number]  Check blood sugar up to 3 x daily as needed Gabriela Hauser, DO  Active   PARoxetine (PAXIL) 20 MG tablet [id number] Yes Take 1 tablet (20 mg total) by mouth daily. Prescribed by  [facility] Parks Ranger, Devonne Doughty, DO Taking Active Self  sulfamethoxazole-trimethoprim (BACTRIM DS) 800-160 MG tablet [id number] Yes TK 1 T PO Q 12 H [provider] Taking Active            Assessment:   Goals Addressed            This Visit's Progress   . Medication Management       Current Barriers:  . Financial Barriers  Pharmacist Clinical Goal(s):  [name] Over the next 30 days, patient will work with [name] to address needs related to optimization of medication regimen and  improvement of medication adherence  Interventions: . Comprehensive medication review performed. o Confirms taking antibiotics (doxycycline and Septra DS) as directed. Counsel on importance of adherence and completing course. Counsel to take with full glass of water. o Reports taking Humalog per sliding scale chart from Endocrinologist o Reports psychiatrist manages her paroxetine and aripiprazole . Perform chart review - patient with type 2 diabetes, not currently on statin therapy o Per recent discharge summary from 10/21/18, discharging physician noted "continue statin medication" under plan for hyperlipidemia, but per document, patient instructed to stop atorvastatin 40 mg and medication removed from med list in chart - Will follow up with PCP regarding statin therapy . Counsel patient on importance of medication adherence o Reports using weekly pillbox to organize her medications o Reports that her sister has setup reminder alarms for her on her phone to aid with adherence o Reports keeping Humalog sliding scale chart (on bright red paper) on front of fridge to ensure that she remember with each meal . Counsel on sleep hygiene. Patient reports psychiatrist also working with patient on these techniques . Patient reports has blood pressure monitor, but needing new larger cuff - to follow up with [facility] over the counter benefit regarding availability of larger cuff . Counsel on importance of diabetes management and control o Reports that she has not been keeping a log of her blood sugars, but started back today - Reports fasting morning blood sugar was 331 mg/dL today o Reports need for follow up appointment with Endocrinologist. States that she will call to schedule on Monday o Reports needs new blood sugar monitor. Note One Touch Verio system covered by [employer] [facility])  Patient Self Care Activities:  . Self administers medications as prescribed o Using weekly pillbox and phone alarms  as adherence tools . Attends all scheduled provider appointments o Patient to schedule follow up appointment with Endocrinology . Calls pharmacy for medication refills o Patient to call [facility] Over The Counter benefit regarding size large blood pressure monitor cuff . Calls provider office for new concerns or questions . Patient to check blood sugar as directed by Endocrinologist and keep log  Initial goal documentation        Plan:  The care management team will reach out to the patient again over the next 7 days.   Harlow Asa, PharmD, [place] [employer] [phone]

## 2018-10-29 NOTE — Patient Instructions (Signed)
Thank you allowing the Chronic Care Management Team to be a part of your care! It was a pleasure speaking with you today!     CCM (Chronic Care Management) Team    Janci Minor RN, BSN Nurse Care Coordinator  [phone]   Harlow Asa PharmD  Clinical Pharmacist  [phone]   [name] LCSW Clinical Social Worker [phone]  Visit Information  Goals Addressed            This Visit's Progress   . Medication Management       Current Barriers:  . Financial Barriers  Pharmacist Clinical Goal(s):  [name] Over the next 30 days, patient will work with [name] to address needs related to optimization of medication regimen and improvement of medication adherence  Interventions: . Comprehensive medication review performed. o Confirms taking antibiotics (doxycycline and Septra DS) as directed. Counsel on importance of adherence and completing course. Counsel to take with full glass of water. o Reports taking Humalog per sliding scale chart from Endocrinologist o Reports psychiatrist manages her paroxetine and aripiprazole . Perform chart review - patient with type 2 diabetes, not currently on statin therapy o Per recent discharge summary from 10/21/18, discharging physician noted "continue statin medication" under plan for hyperlipidemia, but per document, patient instructed to stop atorvastatin 40 mg and medication removed from med list in chart - Will follow up with PCP regarding statin therapy . Counsel patient on importance of medication adherence o Reports using weekly pillbox to organize her medications o Reports that her sister has setup reminder alarms for her on her phone to aid with adherence o Reports keeping Humalog sliding scale chart (on bright red paper) on front of fridge to ensure that she remember with each meal . Counsel on sleep hygiene. Patient reports also working with psychiatrist on these techniques . Patient reports has blood pressure  monitor, but needing new larger cuff - to follow up with [facility] over the counter benefit regarding availability of larger cuff . Counsel on importance of diabetes management and control o Reports that she has not been keeping a log of her blood sugars, but started back today - Reports fasting morning blood sugar was 331 mg/dL today o Reports need for follow up appointment with Endocrinologist. States that she will call to schedule on Monday o Reports needs new blood sugar monitor. Note One Touch Verio system covered by [employer] [facility])  Patient Self Care Activities:  . Self administers medications as prescribed o Using weekly pillbox and phone alarms as adherence tools . Attends all scheduled provider appointments o Patient to schedule follow up appointment with Endocrinology . Calls pharmacy for medication refills o Patient to call [facility] Over The Counter benefit regarding size large blood pressure monitor cuff . Calls provider office for new concerns or questions . Patient to check blood sugar as directed by Endocrinologist and keep log  Initial goal documentation        The patient verbalized understanding of instructions provided today and declined a print copy of patient instruction materials.   The care management team will reach out to the patient again over the next 7 days.   Harlow Asa, PharmD, [place] [employer] [phone]

## 2018-10-29 NOTE — Chronic Care Management (AMB) (Signed)
  Chronic Care Management   Follow Up Note   10/29/2018 Name: [name] MRN: [id number] DOB: [birth year]  Referred by: [name], DO Reason for referral : Chronic Care Management (Diabetes education)   [name] is a 53 y.o. year old female who is a primary care patient of [name], DO. The CCM team was consulted for assistance with chronic disease management and care coordination needs.    Review of patient status, including review of consultants reports, relevant laboratory and other test results, and collaboration with appropriate care team members and the patient's provider was performed as part of comprehensive patient evaluation and provision of chronic care management services.    Goals Addressed            This Visit's Progress   . I want to get my diabetes back on track (pt-stated)       Current Barriers:  [name] Knowledge Deficits related to basic Diabetes pathophysiology and self care/management . Knowledge Deficits related to medications used for management of diabetes . Does not use cbg meter   Case Manager Clinical Goal(s):  Over the next 90 days, patient will demonstrate improved adherence to prescribed treatment plan for diabetes self care/management as evidenced by:  . daily monitoring and recording of CBG  . adherence to ADA/ carb modified diet . exercise 3 days/week . adherence to prescribed medication regimen  Interventions:  . Reviewed with patient diabetic diet . Reviewed appropriate CBG numbers, patient stating sugars were in the 300's now down to 216 this am. Encouraged patient to continue to work to reduce sugars to assist with healing of bilateral foot wounds. . Discussed wounds to feet, patient stating the [facility] RN came and changed bandages and stated the areas were looking better.  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 .  Discussed with her what she felt the hardest parts of caring for her diabetes were, patient stated " Staying on track with all my medications and my diet"  . Encouraged patient to call CCM pharmacist to assist with medication management, gave patient Pharm-D's number.   Patient Self Care Activities:  . UNABLE to independently self manage diabetes as evidenced by A1C >12  Please see past updates related to this goal by clicking on the "Past Updates" button in the selected goal          The care management team will reach out to the patient again over the next 30 days.  The patient has been provided with contact information for the care management team and has been advised to call with any health related questions or concerns.    [name] Lucifer Soja RN, BSN Nurse Case Pharmacist, [facility]  [phone]) [employer]

## 2018-10-29 NOTE — Patient Instructions (Signed)
Thank you allowing the Chronic Care Management Team to be a part of your care! It was a pleasure speaking with you today! CCM (Chronic Care Management) Team   Juancarlos Crescenzo RN, BSN Nurse Care Coordinator  [phone]  Harlow Asa PharmD  Clinical Pharmacist  [phone]  [name] LCSW Clinical Social Worker [phone]  Goals Addressed            This Visit's Progress   . I want to get my diabetes back on track (pt-stated)       Current Barriers:  [name] Knowledge Deficits related to basic Diabetes pathophysiology and self care/management . Knowledge Deficits related to medications used for management of diabetes . Does not use cbg meter   Case Manager Clinical Goal(s):  Over the next 90 days, patient will demonstrate improved adherence to prescribed treatment plan for diabetes self care/management as evidenced by:  . daily monitoring and recording of CBG  . adherence to ADA/ carb modified diet . exercise 3 days/week . adherence to prescribed medication regimen  Interventions:  . Reviewed with patient diabetic diet . Reviewed appropriate CBG numbers, patient stating sugars were in the 300's now down to 216 this am. Encouraged patient to continue to work to reduce sugars to assist with healing of bilateral foot wounds. . Discussed wounds to feet, patient stating the [facility] RN came and changed bandages and stated the areas were looking better.  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 . Discussed with her what she felt the hardest parts of caring for her diabetes were, patient stated " Staying on track with all my medications and my diet"  . Encouraged patient to call CCM pharmacist to assist with medication management, gave patient Pharm-D's number.   Patient Self Care Activities:  . UNABLE to independently self manage diabetes as evidenced by A1C >12  Please see past updates  related to this goal by clicking on the "Past Updates" button in the selected goal         The patient verbalized understanding of instructions provided today and declined a print copy of patient instruction materials.   The patient has been provided with contact information for the care management team and has been advised to call with any health related questions or concerns.

## 2018-10-30 DIAGNOSIS — Z87891 Personal history of nicotine dependence: Secondary | ICD-10-CM | POA: Diagnosis not present

## 2018-10-30 DIAGNOSIS — L97412 Non-pressure chronic ulcer of right heel and midfoot with fat layer exposed: Secondary | ICD-10-CM | POA: Diagnosis not present

## 2018-10-30 DIAGNOSIS — E1165 Type 2 diabetes mellitus with hyperglycemia: Secondary | ICD-10-CM | POA: Diagnosis not present

## 2018-10-30 DIAGNOSIS — L97422 Non-pressure chronic ulcer of left heel and midfoot with fat layer exposed: Secondary | ICD-10-CM | POA: Diagnosis not present

## 2018-10-30 DIAGNOSIS — E1142 Type 2 diabetes mellitus with diabetic polyneuropathy: Secondary | ICD-10-CM | POA: Diagnosis not present

## 2018-10-30 DIAGNOSIS — G709 Myoneural disorder, unspecified: Secondary | ICD-10-CM | POA: Diagnosis not present

## 2018-10-30 DIAGNOSIS — I10 Essential (primary) hypertension: Secondary | ICD-10-CM | POA: Diagnosis not present

## 2018-10-30 DIAGNOSIS — L03116 Cellulitis of left lower limb: Secondary | ICD-10-CM | POA: Diagnosis not present

## 2018-10-30 DIAGNOSIS — Z794 Long term (current) use of insulin: Secondary | ICD-10-CM | POA: Diagnosis not present

## 2018-10-30 DIAGNOSIS — E785 Hyperlipidemia, unspecified: Secondary | ICD-10-CM | POA: Diagnosis not present

## 2018-10-30 DIAGNOSIS — E11621 Type 2 diabetes mellitus with foot ulcer: Secondary | ICD-10-CM | POA: Diagnosis not present

## 2018-10-31 DIAGNOSIS — E785 Hyperlipidemia, unspecified: Secondary | ICD-10-CM | POA: Diagnosis not present

## 2018-10-31 DIAGNOSIS — E1165 Type 2 diabetes mellitus with hyperglycemia: Secondary | ICD-10-CM | POA: Diagnosis not present

## 2018-10-31 DIAGNOSIS — L97412 Non-pressure chronic ulcer of right heel and midfoot with fat layer exposed: Secondary | ICD-10-CM | POA: Diagnosis not present

## 2018-10-31 DIAGNOSIS — Z794 Long term (current) use of insulin: Secondary | ICD-10-CM | POA: Diagnosis not present

## 2018-10-31 DIAGNOSIS — E11621 Type 2 diabetes mellitus with foot ulcer: Secondary | ICD-10-CM | POA: Diagnosis not present

## 2018-10-31 DIAGNOSIS — I10 Essential (primary) hypertension: Secondary | ICD-10-CM | POA: Diagnosis not present

## 2018-10-31 DIAGNOSIS — Z87891 Personal history of nicotine dependence: Secondary | ICD-10-CM | POA: Diagnosis not present

## 2018-10-31 DIAGNOSIS — E1142 Type 2 diabetes mellitus with diabetic polyneuropathy: Secondary | ICD-10-CM | POA: Diagnosis not present

## 2018-10-31 DIAGNOSIS — L97422 Non-pressure chronic ulcer of left heel and midfoot with fat layer exposed: Secondary | ICD-10-CM | POA: Diagnosis not present

## 2018-10-31 DIAGNOSIS — L03116 Cellulitis of left lower limb: Secondary | ICD-10-CM | POA: Diagnosis not present

## 2018-10-31 DIAGNOSIS — G709 Myoneural disorder, unspecified: Secondary | ICD-10-CM | POA: Diagnosis not present

## 2018-11-01 DIAGNOSIS — L97411 Non-pressure chronic ulcer of right heel and midfoot limited to breakdown of skin: Secondary | ICD-10-CM | POA: Diagnosis not present

## 2018-11-01 DIAGNOSIS — L97422 Non-pressure chronic ulcer of left heel and midfoot with fat layer exposed: Secondary | ICD-10-CM | POA: Diagnosis not present

## 2018-11-02 DIAGNOSIS — L03116 Cellulitis of left lower limb: Secondary | ICD-10-CM | POA: Diagnosis not present

## 2018-11-02 DIAGNOSIS — I10 Essential (primary) hypertension: Secondary | ICD-10-CM | POA: Diagnosis not present

## 2018-11-02 DIAGNOSIS — L97422 Non-pressure chronic ulcer of left heel and midfoot with fat layer exposed: Secondary | ICD-10-CM | POA: Diagnosis not present

## 2018-11-02 DIAGNOSIS — Z794 Long term (current) use of insulin: Secondary | ICD-10-CM | POA: Diagnosis not present

## 2018-11-02 DIAGNOSIS — G709 Myoneural disorder, unspecified: Secondary | ICD-10-CM | POA: Diagnosis not present

## 2018-11-02 DIAGNOSIS — E11621 Type 2 diabetes mellitus with foot ulcer: Secondary | ICD-10-CM | POA: Diagnosis not present

## 2018-11-02 DIAGNOSIS — L97412 Non-pressure chronic ulcer of right heel and midfoot with fat layer exposed: Secondary | ICD-10-CM | POA: Diagnosis not present

## 2018-11-02 DIAGNOSIS — Z87891 Personal history of nicotine dependence: Secondary | ICD-10-CM | POA: Diagnosis not present

## 2018-11-02 DIAGNOSIS — E1142 Type 2 diabetes mellitus with diabetic polyneuropathy: Secondary | ICD-10-CM | POA: Diagnosis not present

## 2018-11-02 DIAGNOSIS — E1165 Type 2 diabetes mellitus with hyperglycemia: Secondary | ICD-10-CM | POA: Diagnosis not present

## 2018-11-02 DIAGNOSIS — E785 Hyperlipidemia, unspecified: Secondary | ICD-10-CM | POA: Diagnosis not present

## 2018-11-06 ENCOUNTER — Ambulatory Visit: Payer: Self-pay | Admitting: Pharmacist

## 2018-11-06 DIAGNOSIS — E785 Hyperlipidemia, unspecified: Secondary | ICD-10-CM | POA: Diagnosis not present

## 2018-11-06 DIAGNOSIS — IMO0002 Reserved for concepts with insufficient information to code with codable children: Secondary | ICD-10-CM

## 2018-11-06 DIAGNOSIS — G709 Myoneural disorder, unspecified: Secondary | ICD-10-CM | POA: Diagnosis not present

## 2018-11-06 DIAGNOSIS — Z87891 Personal history of nicotine dependence: Secondary | ICD-10-CM | POA: Diagnosis not present

## 2018-11-06 DIAGNOSIS — L97422 Non-pressure chronic ulcer of left heel and midfoot with fat layer exposed: Secondary | ICD-10-CM | POA: Diagnosis not present

## 2018-11-06 DIAGNOSIS — E1165 Type 2 diabetes mellitus with hyperglycemia: Secondary | ICD-10-CM | POA: Diagnosis not present

## 2018-11-06 DIAGNOSIS — E11621 Type 2 diabetes mellitus with foot ulcer: Secondary | ICD-10-CM | POA: Diagnosis not present

## 2018-11-06 DIAGNOSIS — E1142 Type 2 diabetes mellitus with diabetic polyneuropathy: Secondary | ICD-10-CM | POA: Diagnosis not present

## 2018-11-06 DIAGNOSIS — Z794 Long term (current) use of insulin: Secondary | ICD-10-CM | POA: Diagnosis not present

## 2018-11-06 DIAGNOSIS — I10 Essential (primary) hypertension: Secondary | ICD-10-CM | POA: Diagnosis not present

## 2018-11-06 DIAGNOSIS — E1169 Type 2 diabetes mellitus with other specified complication: Secondary | ICD-10-CM

## 2018-11-06 DIAGNOSIS — E1149 Type 2 diabetes mellitus with other diabetic neurological complication: Secondary | ICD-10-CM

## 2018-11-06 DIAGNOSIS — L03116 Cellulitis of left lower limb: Secondary | ICD-10-CM | POA: Diagnosis not present

## 2018-11-06 DIAGNOSIS — L97412 Non-pressure chronic ulcer of right heel and midfoot with fat layer exposed: Secondary | ICD-10-CM | POA: Diagnosis not present

## 2018-11-06 NOTE — Patient Instructions (Signed)
Thank you allowing the Chronic Care Management Team to be a part of your care! It was a pleasure speaking with you today!     CCM (Chronic Care Management) Team    Janci Minor RN, BSN Nurse Care Coordinator  [phone]   Harlow Asa PharmD  Clinical Pharmacist  [phone]   [name] LCSW Clinical Social Worker [phone]  Visit Information  Goals Addressed            This Visit's Progress   . Medication Management       Current Barriers:  . Financial Barriers  Pharmacist Clinical Goal(s):  [name] Over the next 30 days, patient will work with [name] to address needs related to optimization of medication regimen and improvement of medication adherence  Interventions: . Collaborate with PCP regarding patient's atorvastatin and need for new glucometer o Per recent discharge summary from 10/21/18, discharging physician noted "continue statin medication" under plan for hyperlipidemia, but per document, patient instructed to stop atorvastatin 40 mg and medication removed from med list in chart - PCP recommends patient restart the atorvastatin 40 mg daily o PCP sent new One Touch Verio meter and supplies to [employer] on 6/29 . Advise patient that per PCP she is to continue atorvastatin 40 mg daily. Patient verbalizes understanding and states that she will add this back to her weekly pillbox now. [name] Counsel patient on importance of medication adherence o Reports using weekly pillbox to organize her medications o Reports using her calendar, marking off when she takes her medicine, as an additional adherence tool o Reports not currently using phone alarm reminders as she found these to be difficult to manage . Follow up with patient regarding size large cuff for BP monitor o Patient to follow up with [facility] over the counter benefit regarding availability of larger cuff . Follow up regarding need for follow up appointment with Endocrinologist.  o Denies  having called to schedule. Calls now, but is unable to get through. Patient to call tomorrow.  Patient Self Care Activities:  . Self administers medications as prescribed o Using weekly pillbox and calendar as adherence tools . Attends all scheduled provider appointments o Patient to schedule follow up appointment with Endocrinology . Calls pharmacy for medication refills o Patient to call [facility] Over The Counter benefit regarding size large blood pressure monitor cuff o Patient to pick up new glucometer from pharmacy . Calls provider office for new concerns or questions . Patient to check blood sugar as directed by Endocrinologist and keep log  Please see past updates related to this goal by clicking on the "Past Updates" button in the selected goal         The patient verbalized understanding of instructions provided today and declined a print copy of patient instruction materials.   Telephone follow up appointment with care management team member scheduled for: 11/07/18 at 1 pm  Harlow Asa, PharmD, [place] [phone]

## 2018-11-06 NOTE — Chronic Care Management (AMB) (Signed)
  Chronic Care Management   Follow Up Note   11/06/2018 Name: [name] MRN: [id number] DOB: 02-Oct-1965  Referred by: [name], DO Reason for referral : Chronic Care Management (Patient Phone Call)   [name] is a 53 y.o. year old female who is a primary care patient of [name], DO. The CCM team was consulted for assistance with chronic disease management and care coordination needs.  [name] has a past medical history including but not limited to type 2 diabetes, hypertension, hyperlipidemia and recent hospitalization for left lower extremity cellulitis with bilateral heel infection, sepsis (10/2018).   I reached out to [name] by phone today.   Review of patient status, including review of consultants reports, relevant laboratory and other test results, and collaboration with appropriate care team members and the patient's provider was performed as part of comprehensive patient evaluation and provision of chronic care management services.    Goals Addressed            This Visit's Progress   . Medication Management       Current Barriers:  . Financial Barriers  Pharmacist Clinical Goal(s):  [name] Over the next 30 days, patient will work with [name] to address needs related to optimization of medication regimen and improvement of medication adherence  Interventions: . Collaborate with PCP regarding patient's atorvastatin and need for new glucometer o Per recent discharge summary from 10/21/18, discharging physician noted "continue statin medication" under plan for hyperlipidemia, but per document, patient instructed to stop atorvastatin 40 mg and medication removed from med list in chart - PCP recommends patient restart the atorvastatin 40 mg daily o PCP sent new One Touch Verio meter and supplies to [employer] on 6/29 . Advise patient that per PCP she is to continue atorvastatin 40 mg daily. Patient  verbalizes understanding and states that she will add this back to her weekly pillbox now. [name] Counsel patient on importance of medication adherence o Reports using weekly pillbox to organize her medications o Reports using her calendar, marking off when she takes her medicine, as an additional adherence tool o Reports not currently using phone alarm reminders as she found these to be difficult to manage . Follow up with patient regarding size large cuff for BP monitor o Patient to follow up with [facility] over the counter benefit regarding availability of larger cuff . Follow up regarding need for follow up appointment with Endocrinologist.  o Denies having called to schedule. Calls now, but is unable to get through. Patient to call tomorrow.  Patient Self Care Activities:  . Self administers medications as prescribed o Using weekly pillbox and calendar as adherence tools . Attends all scheduled provider appointments o Patient to schedule follow up appointment with Endocrinology . Calls pharmacy for medication refills o Patient to call [facility] Over The Counter benefit regarding size large blood pressure monitor cuff o Patient to pick up new glucometer from pharmacy . Calls provider office for new concerns or questions . Patient to check blood sugar as directed by Endocrinologist and keep log  Please see past updates related to this goal by clicking on the "Past Updates" button in the selected goal         Plan  Telephone follow up appointment with care management team member scheduled for: 11/07/18 to follow up regarding diabetes management, medication adherence and coordination of care.  Harlow Asa, PharmD, [place] [employer] [phone]

## 2018-11-07 ENCOUNTER — Ambulatory Visit: Payer: Self-pay | Admitting: Pharmacist

## 2018-11-07 DIAGNOSIS — I10 Essential (primary) hypertension: Secondary | ICD-10-CM | POA: Diagnosis not present

## 2018-11-07 DIAGNOSIS — Z794 Long term (current) use of insulin: Secondary | ICD-10-CM | POA: Diagnosis not present

## 2018-11-07 DIAGNOSIS — E11621 Type 2 diabetes mellitus with foot ulcer: Secondary | ICD-10-CM | POA: Diagnosis not present

## 2018-11-07 DIAGNOSIS — E1165 Type 2 diabetes mellitus with hyperglycemia: Secondary | ICD-10-CM | POA: Diagnosis not present

## 2018-11-07 DIAGNOSIS — L03116 Cellulitis of left lower limb: Secondary | ICD-10-CM | POA: Diagnosis not present

## 2018-11-07 DIAGNOSIS — Z87891 Personal history of nicotine dependence: Secondary | ICD-10-CM | POA: Diagnosis not present

## 2018-11-07 DIAGNOSIS — E785 Hyperlipidemia, unspecified: Secondary | ICD-10-CM | POA: Diagnosis not present

## 2018-11-07 DIAGNOSIS — L97422 Non-pressure chronic ulcer of left heel and midfoot with fat layer exposed: Secondary | ICD-10-CM | POA: Diagnosis not present

## 2018-11-07 DIAGNOSIS — E1142 Type 2 diabetes mellitus with diabetic polyneuropathy: Secondary | ICD-10-CM | POA: Diagnosis not present

## 2018-11-07 DIAGNOSIS — L97412 Non-pressure chronic ulcer of right heel and midfoot with fat layer exposed: Secondary | ICD-10-CM | POA: Diagnosis not present

## 2018-11-07 DIAGNOSIS — E1149 Type 2 diabetes mellitus with other diabetic neurological complication: Secondary | ICD-10-CM

## 2018-11-07 DIAGNOSIS — IMO0002 Reserved for concepts with insufficient information to code with codable children: Secondary | ICD-10-CM

## 2018-11-07 DIAGNOSIS — G709 Myoneural disorder, unspecified: Secondary | ICD-10-CM | POA: Diagnosis not present

## 2018-11-07 NOTE — Patient Instructions (Signed)
Thank you allowing the Chronic Care Management Team to be a part of your care! It was a pleasure speaking with you today!     CCM (Chronic Care Management) Team    Janci Minor RN, BSN Nurse Care Coordinator  [phone]   Harlow Asa PharmD  Clinical Pharmacist  [phone]   [name] LCSW Clinical Social Worker [phone]  Visit Information  Goals Addressed            This Visit's Progress   . Medication Management       Current Barriers:  . Financial Barriers  Pharmacist Clinical Goal(s):  [name] Over the next 30 days, patient will work with [name] to address needs related to optimization of medication regimen and improvement of medication adherence  Interventions: . Coordination of care: o Ms. Bergen denies having called to schedule appointment with endocrinology.  - Patient calls now and schedules appointment for 7/16 at 4 pm, with lab work on 7/13 o Note per chart, patient scheduled for Podiatry follow up appointment 7/16  . Follow up with patient regarding size large cuff for BP monitor o Patient calls [facility] over the counter benefit now . Call patient back to continue discussion of medication adherence and diabetes management. Leave a HIPAA compliant voicemail asking patient to return my call.   Patient Self Care Activities:  . Self administers medications as prescribed o Using weekly pillbox and calendar as adherence tools . Attends all scheduled provider appointments o Endocrinology lab appointment on 7/13 o Endocrinology visit on 7/16 - 4 pm o Podiatry appointment on 7/16 - 9 am . Calls pharmacy for medication refills o Patient to call [facility] Over The Counter benefit regarding size large blood pressure monitor cuff o Patient to pick up new glucometer from pharmacy . Calls provider office for new concerns or questions . Patient to check blood sugar as directed by Endocrinologist and keep log  Please see past updates related  to this goal by clicking on the "Past Updates" button in the selected goal         The patient verbalized understanding of instructions provided today and declined a print copy of patient instruction materials.   The care management team will reach out to the patient again over the next 7 days.   Harlow Asa, PharmD, [place] [employer] [phone]

## 2018-11-07 NOTE — Chronic Care Management (AMB) (Signed)
  Chronic Care Management   Follow Up Note   11/07/2018 Name: [name] MRN: [id number] DOB: 10/20/1965  Referred by: [name], DO Reason for referral : Chronic Care Management (Patient Phone Call)   [name] is a 53 y.o. year old female who is a primary care patient of [name], DO. The CCM team was consulted for assistance with chronic disease management and care coordination needs.  [name] has a past medical history including but not limited to type 2 diabetes, hypertension, hyperlipidemia and recent hospitalization for left lower extremity cellulitis with bilateral heel infection, sepsis (10/2018).   I reached out to [name] by phone today.   Review of patient status, including review of consultants reports, relevant laboratory and other test results, and collaboration with appropriate care team members and the patient's provider was performed as part of comprehensive patient evaluation and provision of chronic care management services.    Goals Addressed            This Visit's Progress   . Medication Management       Current Barriers:  . Financial Barriers  Pharmacist Clinical Goal(s):  [name] Over the next 30 days, patient will work with [name] to address needs related to optimization of medication regimen and improvement of medication adherence  Interventions: . Coordination of care: o Ms. Pfeifer denies having called to schedule appointment with endocrinology.  - Patient calls now and schedules appointment for 7/16 at 4 pm, with lab work on 7/13 o Note per chart, patient scheduled for Podiatry follow up appointment 7/16  . Follow up with patient regarding size large cuff for BP monitor o Patient calls [facility] over the counter benefit now . Call patient back to continue discussion of medication adherence and diabetes management. Leave a HIPAA compliant voicemail asking patient to return my  call.   Patient Self Care Activities:  . Self administers medications as prescribed o Using weekly pillbox and calendar as adherence tools . Attends all scheduled provider appointments o Endocrinology lab appointment on 7/13 o Endocrinology visit on 7/16 - 4 pm o Podiatry appointment on 7/16 - 9 am . Calls pharmacy for medication refills o Patient to call [facility] Over The Counter benefit regarding size large blood pressure monitor cuff o Patient to pick up new glucometer from pharmacy . Calls provider office for new concerns or questions . Patient to check blood sugar as directed by Endocrinologist and keep log  Please see past updates related to this goal by clicking on the "Past Updates" button in the selected goal          Plan   The care management team will reach out to the patient again over the next 7 days.   Harlow Asa, PharmD, [place] [employer] [phone]

## 2018-11-08 DIAGNOSIS — L97422 Non-pressure chronic ulcer of left heel and midfoot with fat layer exposed: Secondary | ICD-10-CM | POA: Diagnosis not present

## 2018-11-08 DIAGNOSIS — I10 Essential (primary) hypertension: Secondary | ICD-10-CM | POA: Diagnosis not present

## 2018-11-08 DIAGNOSIS — G709 Myoneural disorder, unspecified: Secondary | ICD-10-CM | POA: Diagnosis not present

## 2018-11-08 DIAGNOSIS — E1165 Type 2 diabetes mellitus with hyperglycemia: Secondary | ICD-10-CM | POA: Diagnosis not present

## 2018-11-08 DIAGNOSIS — Z87891 Personal history of nicotine dependence: Secondary | ICD-10-CM | POA: Diagnosis not present

## 2018-11-08 DIAGNOSIS — Z794 Long term (current) use of insulin: Secondary | ICD-10-CM | POA: Diagnosis not present

## 2018-11-08 DIAGNOSIS — E785 Hyperlipidemia, unspecified: Secondary | ICD-10-CM | POA: Diagnosis not present

## 2018-11-08 DIAGNOSIS — E1142 Type 2 diabetes mellitus with diabetic polyneuropathy: Secondary | ICD-10-CM | POA: Diagnosis not present

## 2018-11-08 DIAGNOSIS — E11621 Type 2 diabetes mellitus with foot ulcer: Secondary | ICD-10-CM | POA: Diagnosis not present

## 2018-11-08 DIAGNOSIS — L97412 Non-pressure chronic ulcer of right heel and midfoot with fat layer exposed: Secondary | ICD-10-CM | POA: Diagnosis not present

## 2018-11-08 DIAGNOSIS — L03116 Cellulitis of left lower limb: Secondary | ICD-10-CM | POA: Diagnosis not present

## 2018-11-09 ENCOUNTER — Telehealth: Payer: Self-pay

## 2018-11-09 DIAGNOSIS — I10 Essential (primary) hypertension: Secondary | ICD-10-CM | POA: Diagnosis not present

## 2018-11-09 DIAGNOSIS — L97412 Non-pressure chronic ulcer of right heel and midfoot with fat layer exposed: Secondary | ICD-10-CM | POA: Diagnosis not present

## 2018-11-09 DIAGNOSIS — L03116 Cellulitis of left lower limb: Secondary | ICD-10-CM | POA: Diagnosis not present

## 2018-11-09 DIAGNOSIS — E785 Hyperlipidemia, unspecified: Secondary | ICD-10-CM | POA: Diagnosis not present

## 2018-11-09 DIAGNOSIS — Z794 Long term (current) use of insulin: Secondary | ICD-10-CM | POA: Diagnosis not present

## 2018-11-09 DIAGNOSIS — Z87891 Personal history of nicotine dependence: Secondary | ICD-10-CM | POA: Diagnosis not present

## 2018-11-09 DIAGNOSIS — E11621 Type 2 diabetes mellitus with foot ulcer: Secondary | ICD-10-CM | POA: Diagnosis not present

## 2018-11-09 DIAGNOSIS — E1165 Type 2 diabetes mellitus with hyperglycemia: Secondary | ICD-10-CM | POA: Diagnosis not present

## 2018-11-09 DIAGNOSIS — G709 Myoneural disorder, unspecified: Secondary | ICD-10-CM | POA: Diagnosis not present

## 2018-11-09 DIAGNOSIS — L97422 Non-pressure chronic ulcer of left heel and midfoot with fat layer exposed: Secondary | ICD-10-CM | POA: Diagnosis not present

## 2018-11-09 DIAGNOSIS — E1142 Type 2 diabetes mellitus with diabetic polyneuropathy: Secondary | ICD-10-CM | POA: Diagnosis not present

## 2018-11-12 ENCOUNTER — Ambulatory Visit: Payer: Self-pay | Admitting: *Deleted

## 2018-11-12 ENCOUNTER — Telehealth: Payer: Self-pay

## 2018-11-12 DIAGNOSIS — IMO0002 Reserved for concepts with insufficient information to code with codable children: Secondary | ICD-10-CM

## 2018-11-12 DIAGNOSIS — E1149 Type 2 diabetes mellitus with other diabetic neurological complication: Secondary | ICD-10-CM

## 2018-11-12 NOTE — Chronic Care Management (AMB) (Signed)
  Chronic Care Management   Outreach Note  11/12/2018 Name: [name] MRN: [id number] DOB: 1966-02-04  Referred by: [name], DO Reason for referral : Chronic Care Management (Unsuccessful CCM outreach)   An unsuccessful telephone outreach was attempted today. The patient was referred to the case management team by for assistance with chronic care management and care coordination.   Follow Up Plan: A HIPPA compliant phone message was left for the patient providing contact information and requesting a return call.  The care management team will reach out to the patient again over the next 30 days.   [name] Domnick Chervenak RN, BSN Nurse Case Pharmacist, [facility] Care [facility]  [phone]) [employer]

## 2018-11-13 DIAGNOSIS — L97412 Non-pressure chronic ulcer of right heel and midfoot with fat layer exposed: Secondary | ICD-10-CM | POA: Diagnosis not present

## 2018-11-13 DIAGNOSIS — E1165 Type 2 diabetes mellitus with hyperglycemia: Secondary | ICD-10-CM | POA: Diagnosis not present

## 2018-11-13 DIAGNOSIS — E11621 Type 2 diabetes mellitus with foot ulcer: Secondary | ICD-10-CM | POA: Diagnosis not present

## 2018-11-13 DIAGNOSIS — I10 Essential (primary) hypertension: Secondary | ICD-10-CM | POA: Diagnosis not present

## 2018-11-13 DIAGNOSIS — L97422 Non-pressure chronic ulcer of left heel and midfoot with fat layer exposed: Secondary | ICD-10-CM | POA: Diagnosis not present

## 2018-11-13 DIAGNOSIS — E785 Hyperlipidemia, unspecified: Secondary | ICD-10-CM | POA: Diagnosis not present

## 2018-11-13 DIAGNOSIS — G709 Myoneural disorder, unspecified: Secondary | ICD-10-CM | POA: Diagnosis not present

## 2018-11-13 DIAGNOSIS — Z87891 Personal history of nicotine dependence: Secondary | ICD-10-CM | POA: Diagnosis not present

## 2018-11-13 DIAGNOSIS — L03116 Cellulitis of left lower limb: Secondary | ICD-10-CM | POA: Diagnosis not present

## 2018-11-13 DIAGNOSIS — E1142 Type 2 diabetes mellitus with diabetic polyneuropathy: Secondary | ICD-10-CM | POA: Diagnosis not present

## 2018-11-13 DIAGNOSIS — Z794 Long term (current) use of insulin: Secondary | ICD-10-CM | POA: Diagnosis not present

## 2018-11-13 IMAGING — US US EXTREM LOW VENOUS*R*
1 series · 13 of 24 positions shown · non-contrast
Comparison: Right lower extremity venous Doppler
ultrasound-09/03/2015

CLINICAL DATA: Right lower extremity edema. Diabetic ulcers
affecting the toes. Evaluate for DVT.



[Series 1: us extrem low venous*right* · 0.10mm/px · 13 of 32 slices shown]
[im 1/32]
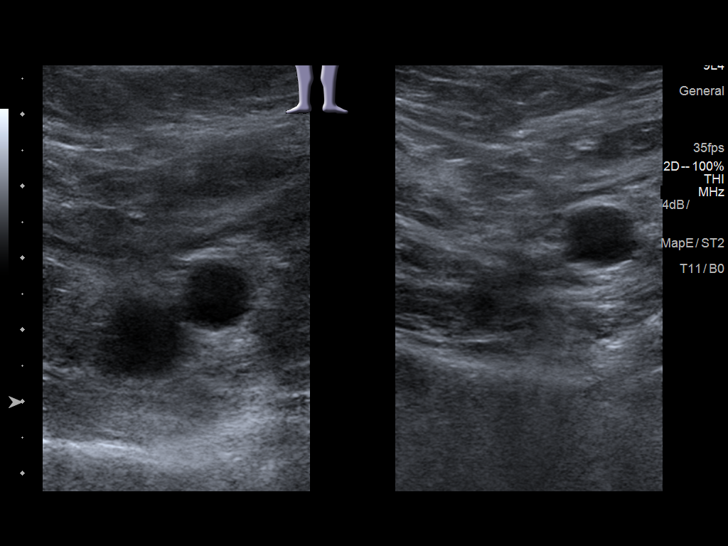
[im 3/32]
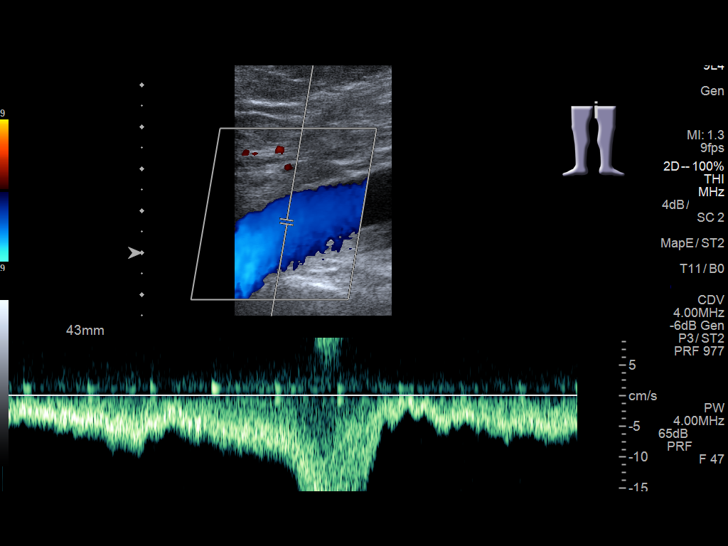
[im 6/32]
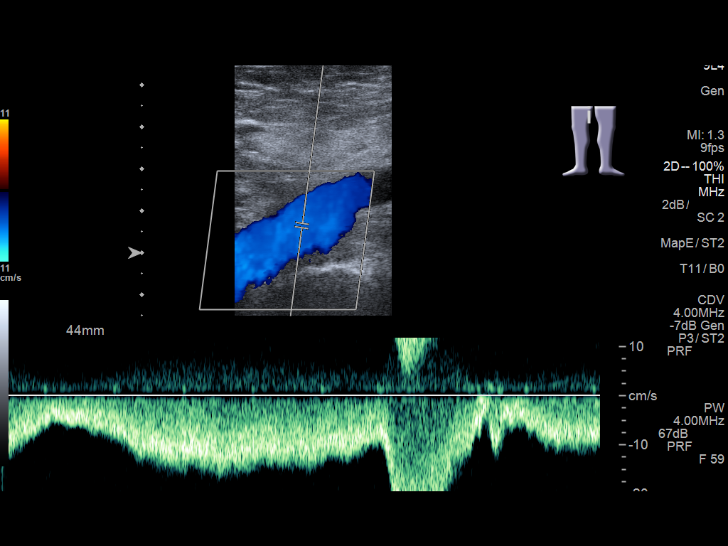
[im 9/32]
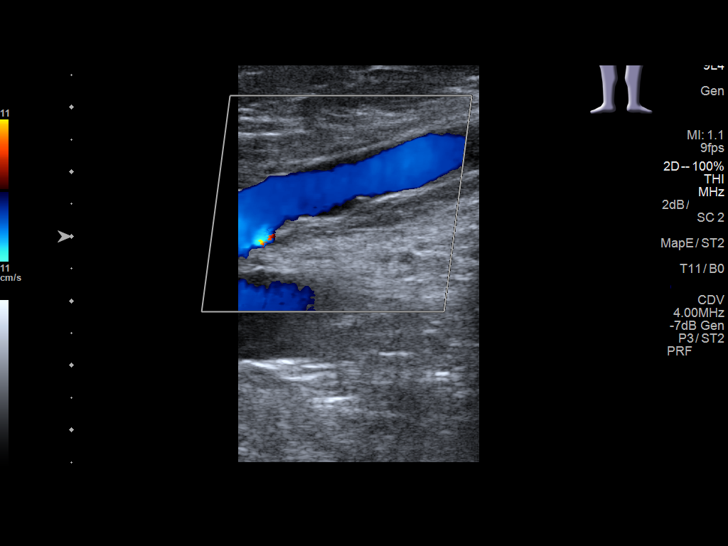
[im 11/32]
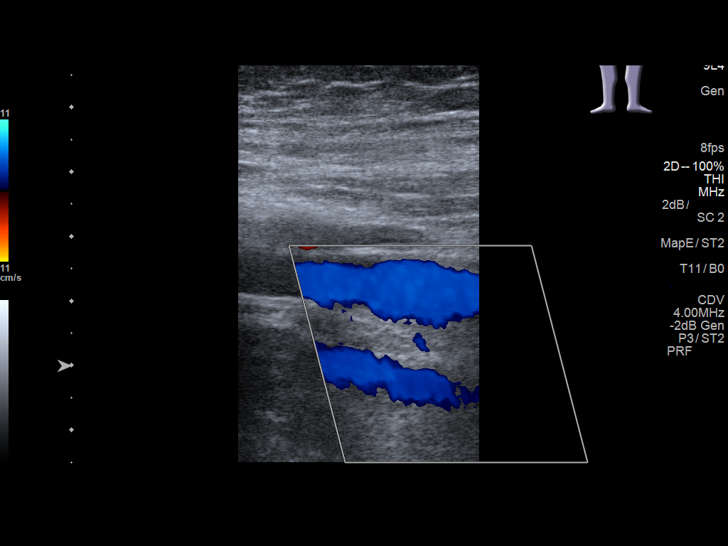
[im 14/32]
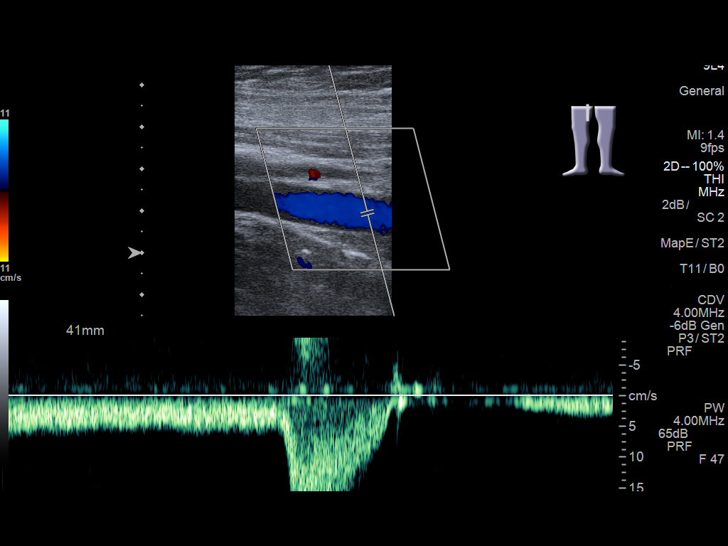
[im 17/32]
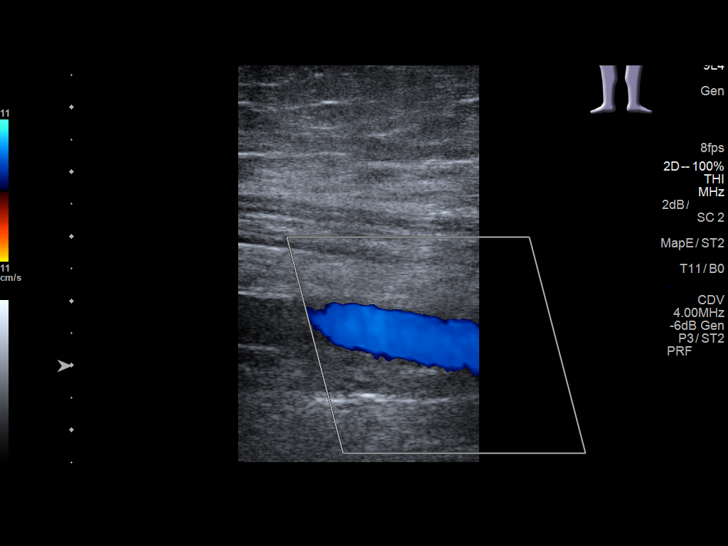
[im 18/32]
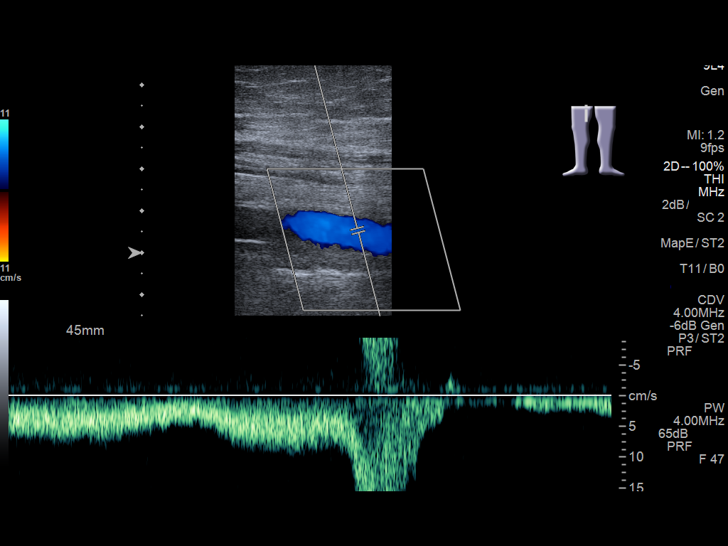
[im 21/32]
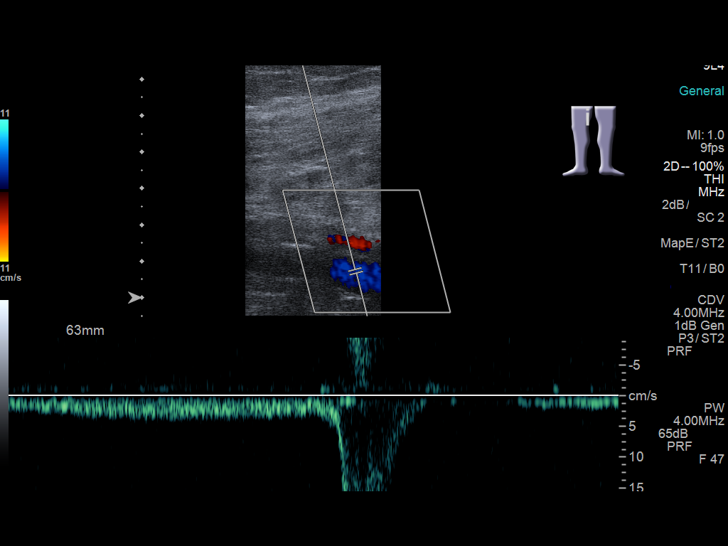
[im 23/32]
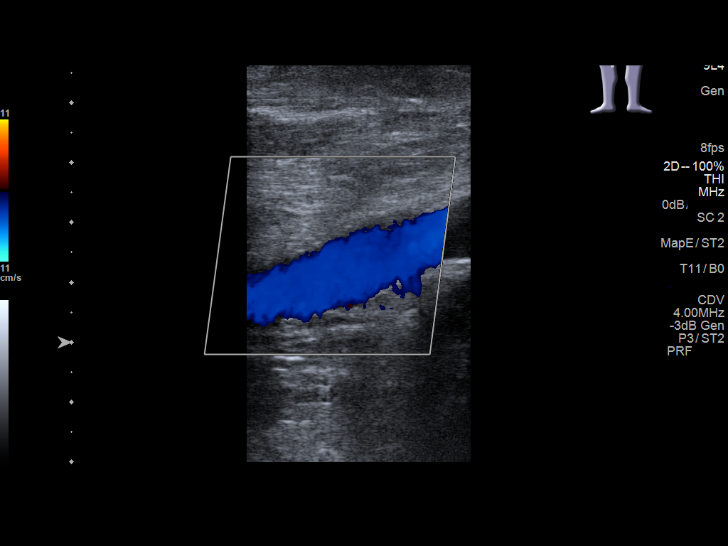
[im 26/32]
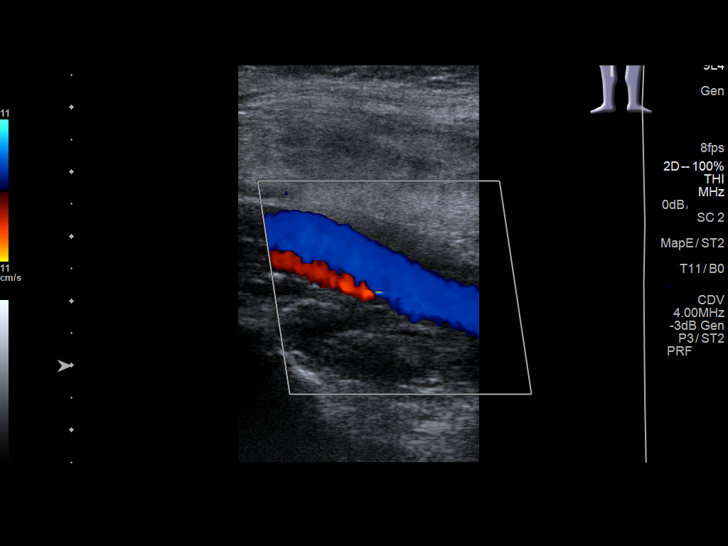
[im 29/32]
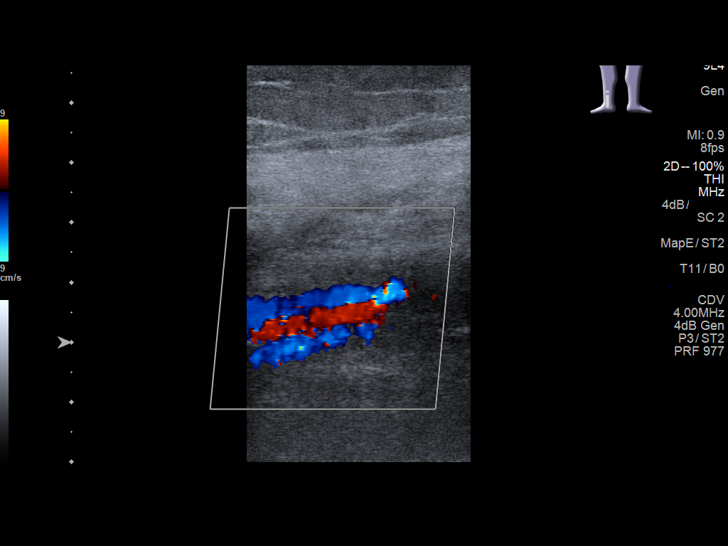
[im 32/32]
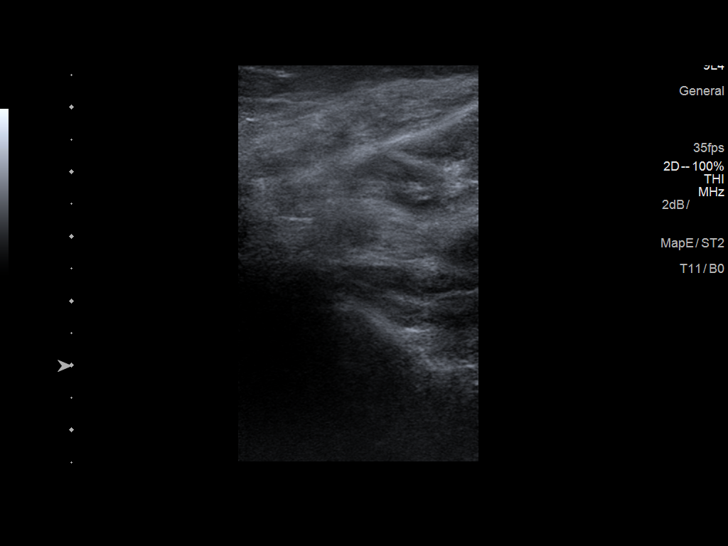

[13 of 24 positions shown; findings below may reference images not displayed]

FINDINGS: Contralateral Common Femoral Vein: Respiratory phasicity is normal
and symmetric with the symptomatic side. No evidence of thrombus.
Normal compressibility.

Common Femoral Vein: No evidence of thrombus. Normal
compressibility, respiratory phasicity and response to augmentation.

Saphenofemoral Junction: No evidence of thrombus. Normal
compressibility and flow on color Doppler imaging.

Profunda Femoral Vein: No evidence of thrombus. Normal
compressibility and flow on color Doppler imaging.

Femoral Vein: No evidence of thrombus. Normal compressibility,
respiratory phasicity and response to augmentation.

Popliteal Vein: No evidence of thrombus. Normal compressibility,
respiratory phasicity and response to augmentation.

Calf Veins: No evidence of thrombus. Normal compressibility and flow
on color Doppler imaging.

Superficial Great Saphenous Vein: No evidence of thrombus. Normal
compressibility.

Venous Reflux:  None.

Other Findings:  None.
IMPRESSION: No evidence of DVT within the right lower extremity.

## 2018-11-14 ENCOUNTER — Ambulatory Visit: Payer: Self-pay | Admitting: Pharmacist

## 2018-11-14 DIAGNOSIS — I10 Essential (primary) hypertension: Secondary | ICD-10-CM | POA: Diagnosis not present

## 2018-11-14 DIAGNOSIS — L97412 Non-pressure chronic ulcer of right heel and midfoot with fat layer exposed: Secondary | ICD-10-CM | POA: Diagnosis not present

## 2018-11-14 DIAGNOSIS — L03116 Cellulitis of left lower limb: Secondary | ICD-10-CM | POA: Diagnosis not present

## 2018-11-14 DIAGNOSIS — E11621 Type 2 diabetes mellitus with foot ulcer: Secondary | ICD-10-CM | POA: Diagnosis not present

## 2018-11-14 DIAGNOSIS — L97422 Non-pressure chronic ulcer of left heel and midfoot with fat layer exposed: Secondary | ICD-10-CM | POA: Diagnosis not present

## 2018-11-14 DIAGNOSIS — Z794 Long term (current) use of insulin: Secondary | ICD-10-CM | POA: Diagnosis not present

## 2018-11-14 DIAGNOSIS — E1149 Type 2 diabetes mellitus with other diabetic neurological complication: Secondary | ICD-10-CM

## 2018-11-14 DIAGNOSIS — IMO0002 Reserved for concepts with insufficient information to code with codable children: Secondary | ICD-10-CM

## 2018-11-14 DIAGNOSIS — E785 Hyperlipidemia, unspecified: Secondary | ICD-10-CM | POA: Diagnosis not present

## 2018-11-14 DIAGNOSIS — G709 Myoneural disorder, unspecified: Secondary | ICD-10-CM | POA: Diagnosis not present

## 2018-11-14 DIAGNOSIS — Z87891 Personal history of nicotine dependence: Secondary | ICD-10-CM | POA: Diagnosis not present

## 2018-11-14 DIAGNOSIS — E1165 Type 2 diabetes mellitus with hyperglycemia: Secondary | ICD-10-CM | POA: Diagnosis not present

## 2018-11-14 DIAGNOSIS — E1142 Type 2 diabetes mellitus with diabetic polyneuropathy: Secondary | ICD-10-CM | POA: Diagnosis not present

## 2018-11-14 NOTE — Patient Instructions (Signed)
Thank you allowing the Chronic Care Management Team to be a part of your care! It was a pleasure speaking with you today!     CCM (Chronic Care Management) Team    Janci Minor RN, BSN Nurse Care Coordinator  [phone]   Harlow Asa PharmD  Clinical Pharmacist  [phone]   [name] LCSW Clinical Social Worker [phone]  Visit Information  Goals Addressed            This Visit's Progress   . Medication Management       Current Barriers:  . Financial Barriers  Pharmacist Clinical Goal(s):  [name] Over the next 30 days, patient will work with [name] to address needs related to optimization of medication regimen and improvement of medication adherence  Interventions: . Coordination of care: o Ms. Copher reports having rescheduled appointment with endocrinology, originally scheduled for tomorrow, to 8/17  - Patient reports she rescheduled due to lack of blood sugar results given that she was previously without a meter.  - Advise patient to call back to see about being seen sooner.  - Advise that patient has A1C scheduled to be drawn with appointment and educate how A1C also provides indication of blood sugar levels o Note per chart, patient scheduled for Podiatry follow up appointment 7/16  . Encourage patient to continue to check blood sugar and keep log o Reports blood sugar was "in the 300s" this morning   Patient Self Care Activities:  . Self administers medications as prescribed o Using weekly pillbox and calendar as adherence tools . Attends all scheduled provider appointments o Patient to call back regarding scheduling of Endocrinology appointment o Podiatry appointment on 7/16 - 9 am . Calls pharmacy for medication refills o Patient to call [facility] Over The Counter benefit regarding size large blood pressure monitor cuff . Calls provider office for new concerns or questions . Patient to check blood sugar as directed by Endocrinologist and  keep log  Please see past updates related to this goal by clicking on the "Past Updates" button in the selected goal         The patient verbalized understanding of instructions provided today and declined a print copy of patient instruction materials.   Telephone follow up appointment with care management team member scheduled for: tomorrow  Harlow Asa, PharmD, [place] [phone]

## 2018-11-14 NOTE — Chronic Care Management (AMB) (Signed)
  Chronic Care Management   Follow Up Note   11/14/2018 Name: [name] MRN: [id number] DOB: 1965/10/29  Referred by: [name], DO Reason for referral : Chronic Care Management (Patient Phone Call)   [name] is a 53 y.o. year old female who is a primary care patient of [name], DO. The CCM team was consulted for assistance with chronic disease management and care coordination needs. [name] has a past medical history including but not limited to type 2 diabetes, hypertension, hyperlipidemia and recent hospitalization for left lower extremity cellulitis with bilateral heel infection, sepsis (10/2018).    I reached out to [name] by phone today.   Review of patient status, including review of consultants reports, relevant laboratory and other test results, and collaboration with appropriate care team members and the patient's provider was performed as part of comprehensive patient evaluation and provision of chronic care management services.    Goals Addressed            This Visit's Progress   . Medication Management       Current Barriers:  . Financial Barriers  Pharmacist Clinical Goal(s):  [name] Over the next 30 days, patient will work with [name] to address needs related to optimization of medication regimen and improvement of medication adherence  Interventions: . Coordination of care: o [name] reports having rescheduled appointment with endocrinology, originally scheduled for tomorrow, to 8/17  - Patient reports she rescheduled due to lack of blood sugar results given that she was previously without a meter.  - Advise patient to call back to see about being seen sooner.  - Advise that patient has A1C scheduled to be drawn with appointment and educate how A1C also provides indication of blood sugar levels o Note per chart, patient scheduled for Podiatry follow up appointment 7/16  . Encourage  patient to continue to check blood sugar and keep log o Reports blood sugar was "in the 300s" this morning   Patient Self Care Activities:  . Self administers medications as prescribed o Using weekly pillbox and calendar as adherence tools . Attends all scheduled provider appointments o Patient to call back regarding scheduling of Endocrinology appointment o Podiatry appointment on 7/16 - 9 am . Calls pharmacy for medication refills o Patient to call [facility] Over The Counter benefit regarding size large blood pressure monitor cuff . Calls provider office for new concerns or questions . Patient to check blood sugar as directed by Endocrinologist and keep log  Please see past updates related to this goal by clicking on the "Past Updates" button in the selected goal         Plan  Telephone follow up appointment with care management team member scheduled for: 7/16  Harlow Asa, PharmD, [facility] [phone]

## 2018-11-15 ENCOUNTER — Telehealth: Payer: Self-pay

## 2018-11-15 ENCOUNTER — Ambulatory Visit: Payer: Self-pay | Admitting: Licensed Clinical Social Worker

## 2018-11-15 ENCOUNTER — Ambulatory Visit: Payer: Self-pay | Admitting: Pharmacist

## 2018-11-15 DIAGNOSIS — Z87891 Personal history of nicotine dependence: Secondary | ICD-10-CM | POA: Diagnosis not present

## 2018-11-15 DIAGNOSIS — L97412 Non-pressure chronic ulcer of right heel and midfoot with fat layer exposed: Secondary | ICD-10-CM | POA: Diagnosis not present

## 2018-11-15 DIAGNOSIS — L03116 Cellulitis of left lower limb: Secondary | ICD-10-CM | POA: Diagnosis not present

## 2018-11-15 DIAGNOSIS — E1165 Type 2 diabetes mellitus with hyperglycemia: Secondary | ICD-10-CM | POA: Diagnosis not present

## 2018-11-15 DIAGNOSIS — I10 Essential (primary) hypertension: Secondary | ICD-10-CM | POA: Diagnosis not present

## 2018-11-15 DIAGNOSIS — E11621 Type 2 diabetes mellitus with foot ulcer: Secondary | ICD-10-CM | POA: Diagnosis not present

## 2018-11-15 DIAGNOSIS — E785 Hyperlipidemia, unspecified: Secondary | ICD-10-CM | POA: Diagnosis not present

## 2018-11-15 DIAGNOSIS — E1142 Type 2 diabetes mellitus with diabetic polyneuropathy: Secondary | ICD-10-CM | POA: Diagnosis not present

## 2018-11-15 DIAGNOSIS — Z794 Long term (current) use of insulin: Secondary | ICD-10-CM | POA: Diagnosis not present

## 2018-11-15 DIAGNOSIS — L97422 Non-pressure chronic ulcer of left heel and midfoot with fat layer exposed: Secondary | ICD-10-CM | POA: Diagnosis not present

## 2018-11-15 DIAGNOSIS — G709 Myoneural disorder, unspecified: Secondary | ICD-10-CM | POA: Diagnosis not present

## 2018-11-15 NOTE — Chronic Care Management (AMB) (Signed)
  Chronic Care Management   Follow Up Note   11/14/2018 Name: [name] MRN: [id number] DOB: June 01, 1965  Referred by: [name], DO Reason for referral : Chronic Care Management (Patient Phone Call)   [name] is a 53 y.o. year old female who is a primary care patient of [name], DO. The CCM team was consulted for assistance with chronic disease management and care coordination needs. [name] has a past medical history including but not limited to type 2 diabetes, hypertension, hyperlipidemia and recent hospitalization for left lower extremity cellulitis with bilateral heel infection, sepsis (10/2018).   Was unable to reach patient via telephone today and have left HIPAA compliant voicemail asking patient to return my call.   Plan  The care management team will reach out to the patient again over the next 7 days.   Harlow Asa, PharmD, [place] [employer] [phone]

## 2018-11-15 NOTE — Chronic Care Management (AMB) (Signed)
  Chronic Care Management    Clinical Social Work Follow Up Note  11/15/2018 Name: [name] MRN: [id number] DOB: [birth year]  [name] is a 53 y.o. year old female who is a primary care patient of [name], DO. The CCM team was consulted for assistance with Caregiver Stress.   Review of patient status, including review of consultants reports, other relevant assessments, and collaboration with appropriate care team members and the patient's provider was performed as part of comprehensive patient evaluation and provision of chronic care management services.     LCSW completed CCM outreach attempt today but was unable to reach patient successfully. A HIPPA compliant voice message was left encouraging patient to return call once available. LCSW rescheduled CCM SW appointment as well.  Follow Up Plan: LCSW will make an additional outreach attempt.  [name], [name], [name], [name] Phone: [phone]

## 2018-11-16 DIAGNOSIS — E11621 Type 2 diabetes mellitus with foot ulcer: Secondary | ICD-10-CM | POA: Diagnosis not present

## 2018-11-16 DIAGNOSIS — L97422 Non-pressure chronic ulcer of left heel and midfoot with fat layer exposed: Secondary | ICD-10-CM | POA: Diagnosis not present

## 2018-11-16 DIAGNOSIS — L03116 Cellulitis of left lower limb: Secondary | ICD-10-CM | POA: Diagnosis not present

## 2018-11-16 DIAGNOSIS — I10 Essential (primary) hypertension: Secondary | ICD-10-CM | POA: Diagnosis not present

## 2018-11-16 DIAGNOSIS — Z87891 Personal history of nicotine dependence: Secondary | ICD-10-CM | POA: Diagnosis not present

## 2018-11-16 DIAGNOSIS — L97412 Non-pressure chronic ulcer of right heel and midfoot with fat layer exposed: Secondary | ICD-10-CM | POA: Diagnosis not present

## 2018-11-16 DIAGNOSIS — E785 Hyperlipidemia, unspecified: Secondary | ICD-10-CM | POA: Diagnosis not present

## 2018-11-16 DIAGNOSIS — Z794 Long term (current) use of insulin: Secondary | ICD-10-CM | POA: Diagnosis not present

## 2018-11-16 DIAGNOSIS — E1142 Type 2 diabetes mellitus with diabetic polyneuropathy: Secondary | ICD-10-CM | POA: Diagnosis not present

## 2018-11-16 DIAGNOSIS — E1165 Type 2 diabetes mellitus with hyperglycemia: Secondary | ICD-10-CM | POA: Diagnosis not present

## 2018-11-16 DIAGNOSIS — G709 Myoneural disorder, unspecified: Secondary | ICD-10-CM | POA: Diagnosis not present

## 2018-11-18 DIAGNOSIS — E11621 Type 2 diabetes mellitus with foot ulcer: Secondary | ICD-10-CM | POA: Diagnosis not present

## 2018-11-19 DIAGNOSIS — Z794 Long term (current) use of insulin: Secondary | ICD-10-CM | POA: Diagnosis not present

## 2018-11-19 DIAGNOSIS — G709 Myoneural disorder, unspecified: Secondary | ICD-10-CM | POA: Diagnosis not present

## 2018-11-19 DIAGNOSIS — I10 Essential (primary) hypertension: Secondary | ICD-10-CM | POA: Diagnosis not present

## 2018-11-19 DIAGNOSIS — E11621 Type 2 diabetes mellitus with foot ulcer: Secondary | ICD-10-CM | POA: Diagnosis not present

## 2018-11-19 DIAGNOSIS — E1165 Type 2 diabetes mellitus with hyperglycemia: Secondary | ICD-10-CM | POA: Diagnosis not present

## 2018-11-19 DIAGNOSIS — E1142 Type 2 diabetes mellitus with diabetic polyneuropathy: Secondary | ICD-10-CM | POA: Diagnosis not present

## 2018-11-19 DIAGNOSIS — Z87891 Personal history of nicotine dependence: Secondary | ICD-10-CM | POA: Diagnosis not present

## 2018-11-19 DIAGNOSIS — L97412 Non-pressure chronic ulcer of right heel and midfoot with fat layer exposed: Secondary | ICD-10-CM | POA: Diagnosis not present

## 2018-11-19 DIAGNOSIS — L03116 Cellulitis of left lower limb: Secondary | ICD-10-CM | POA: Diagnosis not present

## 2018-11-19 DIAGNOSIS — E785 Hyperlipidemia, unspecified: Secondary | ICD-10-CM | POA: Diagnosis not present

## 2018-11-19 DIAGNOSIS — L97422 Non-pressure chronic ulcer of left heel and midfoot with fat layer exposed: Secondary | ICD-10-CM | POA: Diagnosis not present

## 2018-11-20 DIAGNOSIS — G709 Myoneural disorder, unspecified: Secondary | ICD-10-CM | POA: Diagnosis not present

## 2018-11-20 DIAGNOSIS — E11621 Type 2 diabetes mellitus with foot ulcer: Secondary | ICD-10-CM | POA: Diagnosis not present

## 2018-11-20 DIAGNOSIS — Z87891 Personal history of nicotine dependence: Secondary | ICD-10-CM | POA: Diagnosis not present

## 2018-11-20 DIAGNOSIS — L03116 Cellulitis of left lower limb: Secondary | ICD-10-CM | POA: Diagnosis not present

## 2018-11-20 DIAGNOSIS — E1142 Type 2 diabetes mellitus with diabetic polyneuropathy: Secondary | ICD-10-CM | POA: Diagnosis not present

## 2018-11-20 DIAGNOSIS — Z794 Long term (current) use of insulin: Secondary | ICD-10-CM | POA: Diagnosis not present

## 2018-11-20 DIAGNOSIS — E1165 Type 2 diabetes mellitus with hyperglycemia: Secondary | ICD-10-CM | POA: Diagnosis not present

## 2018-11-20 DIAGNOSIS — E785 Hyperlipidemia, unspecified: Secondary | ICD-10-CM | POA: Diagnosis not present

## 2018-11-20 DIAGNOSIS — L97422 Non-pressure chronic ulcer of left heel and midfoot with fat layer exposed: Secondary | ICD-10-CM | POA: Diagnosis not present

## 2018-11-20 DIAGNOSIS — L97412 Non-pressure chronic ulcer of right heel and midfoot with fat layer exposed: Secondary | ICD-10-CM | POA: Diagnosis not present

## 2018-11-20 DIAGNOSIS — I10 Essential (primary) hypertension: Secondary | ICD-10-CM | POA: Diagnosis not present

## 2018-11-21 DIAGNOSIS — L97422 Non-pressure chronic ulcer of left heel and midfoot with fat layer exposed: Secondary | ICD-10-CM | POA: Diagnosis not present

## 2018-11-21 DIAGNOSIS — L97412 Non-pressure chronic ulcer of right heel and midfoot with fat layer exposed: Secondary | ICD-10-CM | POA: Diagnosis not present

## 2018-11-21 DIAGNOSIS — E1142 Type 2 diabetes mellitus with diabetic polyneuropathy: Secondary | ICD-10-CM | POA: Diagnosis not present

## 2018-11-21 DIAGNOSIS — Z87891 Personal history of nicotine dependence: Secondary | ICD-10-CM | POA: Diagnosis not present

## 2018-11-21 DIAGNOSIS — L03116 Cellulitis of left lower limb: Secondary | ICD-10-CM | POA: Diagnosis not present

## 2018-11-21 DIAGNOSIS — E11621 Type 2 diabetes mellitus with foot ulcer: Secondary | ICD-10-CM | POA: Diagnosis not present

## 2018-11-21 DIAGNOSIS — I10 Essential (primary) hypertension: Secondary | ICD-10-CM | POA: Diagnosis not present

## 2018-11-21 DIAGNOSIS — G709 Myoneural disorder, unspecified: Secondary | ICD-10-CM | POA: Diagnosis not present

## 2018-11-21 DIAGNOSIS — E785 Hyperlipidemia, unspecified: Secondary | ICD-10-CM | POA: Diagnosis not present

## 2018-11-21 DIAGNOSIS — Z794 Long term (current) use of insulin: Secondary | ICD-10-CM | POA: Diagnosis not present

## 2018-11-21 DIAGNOSIS — E1165 Type 2 diabetes mellitus with hyperglycemia: Secondary | ICD-10-CM | POA: Diagnosis not present

## 2018-11-22 ENCOUNTER — Ambulatory Visit: Payer: Self-pay | Admitting: Pharmacist

## 2018-11-22 ENCOUNTER — Telehealth: Payer: Self-pay

## 2018-11-22 DIAGNOSIS — L03116 Cellulitis of left lower limb: Secondary | ICD-10-CM | POA: Diagnosis not present

## 2018-11-22 DIAGNOSIS — Z87891 Personal history of nicotine dependence: Secondary | ICD-10-CM | POA: Diagnosis not present

## 2018-11-22 DIAGNOSIS — Z794 Long term (current) use of insulin: Secondary | ICD-10-CM | POA: Diagnosis not present

## 2018-11-22 DIAGNOSIS — E1165 Type 2 diabetes mellitus with hyperglycemia: Secondary | ICD-10-CM | POA: Diagnosis not present

## 2018-11-22 DIAGNOSIS — L97422 Non-pressure chronic ulcer of left heel and midfoot with fat layer exposed: Secondary | ICD-10-CM | POA: Diagnosis not present

## 2018-11-22 DIAGNOSIS — L97412 Non-pressure chronic ulcer of right heel and midfoot with fat layer exposed: Secondary | ICD-10-CM | POA: Diagnosis not present

## 2018-11-22 DIAGNOSIS — E785 Hyperlipidemia, unspecified: Secondary | ICD-10-CM | POA: Diagnosis not present

## 2018-11-22 DIAGNOSIS — G709 Myoneural disorder, unspecified: Secondary | ICD-10-CM | POA: Diagnosis not present

## 2018-11-22 DIAGNOSIS — I10 Essential (primary) hypertension: Secondary | ICD-10-CM | POA: Diagnosis not present

## 2018-11-22 DIAGNOSIS — E1142 Type 2 diabetes mellitus with diabetic polyneuropathy: Secondary | ICD-10-CM | POA: Diagnosis not present

## 2018-11-22 DIAGNOSIS — E11621 Type 2 diabetes mellitus with foot ulcer: Secondary | ICD-10-CM | POA: Diagnosis not present

## 2018-11-22 NOTE — Chronic Care Management (AMB) (Signed)
  Chronic Care Management   Follow Up Note   11/22/2018 Name: [name] MRN: [id number] DOB: 05-19-1965  Referred by: [name], DO Reason for referral : Chronic Care Management (Patient Phone Call)   ALVITA FANA is a 53 y.o. year old female who is a primary care patient of [name], DO. The CCM team was consulted for assistance with chronic disease management and care coordination needs.  [name] has a past medical history including but not limited to type 2 diabetes, hypertension, hyperlipidemia and recent hospitalization for left lower extremity cellulitis with bilateral heel infection, sepsis (10/2018).   Was unable to reach patient via telephone today and have left HIPAA compliant voicemail asking patient to return my call. Outreach attempt #2.  Review of patient status, including review of consultants reports, relevant laboratory and other test results, and collaboration with appropriate care team members and the patient's provider was performed as part of comprehensive patient evaluation and provision of chronic care management services.     The care management team will reach out to the patient again over the next 14 days.   Harlow Asa, PharmD, [place] [employer] [phone]

## 2018-11-23 DIAGNOSIS — L97412 Non-pressure chronic ulcer of right heel and midfoot with fat layer exposed: Secondary | ICD-10-CM | POA: Diagnosis not present

## 2018-11-23 DIAGNOSIS — Z87891 Personal history of nicotine dependence: Secondary | ICD-10-CM | POA: Diagnosis not present

## 2018-11-23 DIAGNOSIS — Z794 Long term (current) use of insulin: Secondary | ICD-10-CM | POA: Diagnosis not present

## 2018-11-23 DIAGNOSIS — G709 Myoneural disorder, unspecified: Secondary | ICD-10-CM | POA: Diagnosis not present

## 2018-11-23 DIAGNOSIS — E11621 Type 2 diabetes mellitus with foot ulcer: Secondary | ICD-10-CM | POA: Diagnosis not present

## 2018-11-23 DIAGNOSIS — E1165 Type 2 diabetes mellitus with hyperglycemia: Secondary | ICD-10-CM | POA: Diagnosis not present

## 2018-11-23 DIAGNOSIS — E785 Hyperlipidemia, unspecified: Secondary | ICD-10-CM | POA: Diagnosis not present

## 2018-11-23 DIAGNOSIS — E1142 Type 2 diabetes mellitus with diabetic polyneuropathy: Secondary | ICD-10-CM | POA: Diagnosis not present

## 2018-11-23 DIAGNOSIS — L97422 Non-pressure chronic ulcer of left heel and midfoot with fat layer exposed: Secondary | ICD-10-CM | POA: Diagnosis not present

## 2018-11-23 DIAGNOSIS — I10 Essential (primary) hypertension: Secondary | ICD-10-CM | POA: Diagnosis not present

## 2018-11-23 DIAGNOSIS — L03116 Cellulitis of left lower limb: Secondary | ICD-10-CM | POA: Diagnosis not present

## 2018-11-26 ENCOUNTER — Telehealth: Payer: Self-pay

## 2018-11-27 ENCOUNTER — Telehealth: Payer: Self-pay

## 2018-11-27 DIAGNOSIS — G709 Myoneural disorder, unspecified: Secondary | ICD-10-CM | POA: Diagnosis not present

## 2018-11-27 DIAGNOSIS — L03116 Cellulitis of left lower limb: Secondary | ICD-10-CM | POA: Diagnosis not present

## 2018-11-27 DIAGNOSIS — E11621 Type 2 diabetes mellitus with foot ulcer: Secondary | ICD-10-CM

## 2018-11-27 DIAGNOSIS — Z794 Long term (current) use of insulin: Secondary | ICD-10-CM | POA: Diagnosis not present

## 2018-11-27 DIAGNOSIS — L97422 Non-pressure chronic ulcer of left heel and midfoot with fat layer exposed: Secondary | ICD-10-CM | POA: Diagnosis not present

## 2018-11-27 DIAGNOSIS — E1142 Type 2 diabetes mellitus with diabetic polyneuropathy: Secondary | ICD-10-CM | POA: Diagnosis not present

## 2018-11-27 DIAGNOSIS — Z87891 Personal history of nicotine dependence: Secondary | ICD-10-CM | POA: Diagnosis not present

## 2018-11-27 DIAGNOSIS — I10 Essential (primary) hypertension: Secondary | ICD-10-CM | POA: Diagnosis not present

## 2018-11-27 DIAGNOSIS — E785 Hyperlipidemia, unspecified: Secondary | ICD-10-CM | POA: Diagnosis not present

## 2018-11-27 DIAGNOSIS — L97429 Non-pressure chronic ulcer of left heel and midfoot with unspecified severity: Secondary | ICD-10-CM

## 2018-11-27 DIAGNOSIS — L97412 Non-pressure chronic ulcer of right heel and midfoot with fat layer exposed: Secondary | ICD-10-CM | POA: Diagnosis not present

## 2018-11-27 DIAGNOSIS — E1165 Type 2 diabetes mellitus with hyperglycemia: Secondary | ICD-10-CM | POA: Diagnosis not present

## 2018-11-27 MED ORDER — AMOXICILLIN-POT CLAVULANATE 875-125 MG PO TABS: 1.0000 | ORAL_TABLET | Freq: Two times a day (BID) | ORAL | 0 refills | Status: DC

## 2018-11-27 NOTE — Telephone Encounter (Signed)
Agree with coverage. Sent antibiotic  [name], [facility] 11/27/2018, 12:23 PM

## 2018-11-27 NOTE — Telephone Encounter (Signed)
Patient advised.

## 2018-11-27 NOTE — Telephone Encounter (Signed)
Tiffany with home health called left heal wound.  Patient had taken off wrap today and reports an  Jonelle Sidle is requesting Augmentin 800mg  bid  For 10 day . Tiffany call back   [phone]

## 2018-11-28 DIAGNOSIS — E1142 Type 2 diabetes mellitus with diabetic polyneuropathy: Secondary | ICD-10-CM | POA: Diagnosis not present

## 2018-11-28 DIAGNOSIS — E1165 Type 2 diabetes mellitus with hyperglycemia: Secondary | ICD-10-CM | POA: Diagnosis not present

## 2018-11-28 DIAGNOSIS — L97412 Non-pressure chronic ulcer of right heel and midfoot with fat layer exposed: Secondary | ICD-10-CM | POA: Diagnosis not present

## 2018-11-28 DIAGNOSIS — L03116 Cellulitis of left lower limb: Secondary | ICD-10-CM | POA: Diagnosis not present

## 2018-11-28 DIAGNOSIS — Z87891 Personal history of nicotine dependence: Secondary | ICD-10-CM | POA: Diagnosis not present

## 2018-11-28 DIAGNOSIS — G709 Myoneural disorder, unspecified: Secondary | ICD-10-CM | POA: Diagnosis not present

## 2018-11-28 DIAGNOSIS — E11621 Type 2 diabetes mellitus with foot ulcer: Secondary | ICD-10-CM | POA: Diagnosis not present

## 2018-11-28 DIAGNOSIS — Z794 Long term (current) use of insulin: Secondary | ICD-10-CM | POA: Diagnosis not present

## 2018-11-28 DIAGNOSIS — I10 Essential (primary) hypertension: Secondary | ICD-10-CM | POA: Diagnosis not present

## 2018-11-28 DIAGNOSIS — E785 Hyperlipidemia, unspecified: Secondary | ICD-10-CM | POA: Diagnosis not present

## 2018-11-28 DIAGNOSIS — L97422 Non-pressure chronic ulcer of left heel and midfoot with fat layer exposed: Secondary | ICD-10-CM | POA: Diagnosis not present

## 2018-11-29 ENCOUNTER — Ambulatory Visit: Payer: Self-pay | Admitting: Pharmacist

## 2018-11-29 ENCOUNTER — Ambulatory Visit: Payer: Self-pay | Admitting: *Deleted

## 2018-11-29 DIAGNOSIS — E1149 Type 2 diabetes mellitus with other diabetic neurological complication: Secondary | ICD-10-CM

## 2018-11-29 DIAGNOSIS — L97429 Non-pressure chronic ulcer of left heel and midfoot with unspecified severity: Secondary | ICD-10-CM

## 2018-11-29 DIAGNOSIS — IMO0002 Reserved for concepts with insufficient information to code with codable children: Secondary | ICD-10-CM

## 2018-11-29 DIAGNOSIS — E11621 Type 2 diabetes mellitus with foot ulcer: Secondary | ICD-10-CM

## 2018-11-29 DIAGNOSIS — F251 Schizoaffective disorder, depressive type: Secondary | ICD-10-CM

## 2018-11-29 NOTE — Patient Instructions (Signed)
Thank you allowing the Chronic Care Management Team to be a part of your care! It was a pleasure speaking with you today!     CCM (Chronic Care Management) Team    Janci Minor RN, BSN Nurse Care Coordinator  [phone]   Harlow Asa PharmD  Clinical Pharmacist  [phone]   [name] LCSW Clinical Social Worker [phone]  Visit Information  Goals Addressed            This Visit's Progress   . Medication Management       Current Barriers:  . Financial Barriers . Non adherence to prescribed medication regimen  Pharmacist Clinical Goal(s):  [name] Over the next 30 days, patient will work with [name] to address needs related to optimization of medication regimen and improvement of medication adherence  Interventions: . Receive collaborative three-way phone call from [place] and patient. Patient's sister, [name], is also on phone with patient o See CM Nurse Case Manager note for further detail . Discuss importance of medication adherence, particularly to insulin therapy o Sister expresses concern that patient is not consistently taking meal time insulin o Ms. Chaires states that she does not know why she is not taking her Humalog . Encourage patient to follow up with mental health provider regarding management of psychiatric medications and discussion of mood . Remind patient of upcoming appointment with Endocrinologist and the importance of keeping this appointment . Address questions regarding Part D formulary - Humalog is the preferred mealtime insulin brand for patinet's plan. . Per chart, on 7/28, PCP started patient on Augmentin 875-125 mg twice daily for 10 days following call from home health requesting further antibiotic for left heal wound. o Ms. Tatlock reports that she is taking the Augmentin as directed. Counsel patient to take this antibiotic with food   Patient Self Care Activities:  . Self administers medications as  prescribed o Using weekly pillbox and calendar as adherence tools . Attends all scheduled provider appointments o Endocrinology appointments: 8/17 (lab work) & 8/24 . Calls pharmacy for medication refills o Patient to call [facility] Over The Counter benefit regarding size large blood pressure monitor cuff . Calls provider office for new concerns or questions . Patient to check blood sugar as directed by Endocrinologist and keep log  Please see past updates related to this goal by clicking on the "Past Updates" button in the selected goal         The patient verbalized understanding of instructions provided today and declined a print copy of patient instruction materials.   The care management team will reach out to the patient again over the next 7 days.   Harlow Asa, PharmD, [place] [employer] [phone]

## 2018-11-29 NOTE — Chronic Care Management (AMB) (Signed)
  Chronic Care Management   Follow Up Note   11/29/2018 Name: [name] MRN: [id number] DOB: 1966/02/18  Referred by: [name], DO Reason for referral : No chief complaint on file.   [name] is a 53 y.o. year old female who is a primary care patient of [name], DO. The CCM team was consulted for assistance with chronic disease management and care coordination needs. [name] has a past medical history including but not limited to type 2 diabetes, hypertension, hyperlipidemia and recent hospitalization for left lower extremity cellulitis with bilateral heel infection, sepsis (10/2018).  Receive incoming collaborative three-way phone call with CM Nurse Case Manager and patient. Patient's sister, [name] (listed on patient's designated party release in chart), is also on phone with patient.  Review of patient status, including review of consultants reports, relevant laboratory and other test results, and collaboration with appropriate care team members and the patient's provider was performed as part of comprehensive patient evaluation and provision of chronic care management services.    Goals Addressed            This Visit's Progress   . Medication Management       Current Barriers:  . Financial Barriers . Non adherence to prescribed medication regimen  Pharmacist Clinical Goal(s):  [name] Over the next 30 days, patient will work with [name] to address needs related to optimization of medication regimen and improvement of medication adherence  Interventions: . Receive collaborative three-way phone call from [place] and patient. Patient's sister, [name], is also on phone with patient o See CM Nurse Case Manager note for further detail . Discuss importance of medication adherence, particularly to insulin therapy o Sister expresses concern that patient is not consistently taking meal time insulin o Ms.  [name] states that she does not know why she is not taking her Humalog . Encourage patient to follow up with mental health provider regarding management of psychiatric medications and discussion of mood . Remind patient of upcoming appointment with Endocrinologist and the importance of keeping this appointment . Address questions regarding Part D formulary - Humalog is the preferred mealtime insulin brand for patinet's plan. . Per chart, on 7/28, PCP started patient on Augmentin 875-125 mg twice daily for 10 days following call from home health requesting further antibiotic for left heal wound. o [name] reports that she is taking the Augmentin as directed. Counsel patient to take this antibiotic with food   Patient Self Care Activities:  . Self administers medications as prescribed o Using weekly pillbox and calendar as adherence tools . Attends all scheduled provider appointments o Endocrinology appointments: 8/17 (lab work) & 8/24 . Calls pharmacy for medication refills o Patient to call [facility] Over The Counter benefit regarding size large blood pressure monitor cuff . Calls provider office for new concerns or questions . Patient to check blood sugar as directed by Endocrinologist and keep log  Please see past updates related to this goal by clicking on the "Past Updates" button in the selected goal         Plan  The care management team will reach out to the patient again over the next 7 days.   Harlow Asa, PharmD, [place] [employer] [phone]

## 2018-11-30 ENCOUNTER — Telehealth: Payer: Self-pay

## 2018-11-30 ENCOUNTER — Encounter: Payer: Self-pay | Admitting: Intensive Care

## 2018-11-30 ENCOUNTER — Emergency Department: Payer: Medicare Other

## 2018-11-30 ENCOUNTER — Other Ambulatory Visit: Payer: Self-pay

## 2018-11-30 DIAGNOSIS — E785 Hyperlipidemia, unspecified: Secondary | ICD-10-CM | POA: Diagnosis not present

## 2018-11-30 DIAGNOSIS — G709 Myoneural disorder, unspecified: Secondary | ICD-10-CM | POA: Diagnosis not present

## 2018-11-30 DIAGNOSIS — R000 Tachycardia, unspecified: Secondary | ICD-10-CM | POA: Diagnosis not present

## 2018-11-30 DIAGNOSIS — Z79899 Other long term (current) drug therapy: Secondary | ICD-10-CM | POA: Insufficient documentation

## 2018-11-30 DIAGNOSIS — Z20828 Contact with and (suspected) exposure to other viral communicable diseases: Secondary | ICD-10-CM | POA: Diagnosis not present

## 2018-11-30 DIAGNOSIS — I10 Essential (primary) hypertension: Secondary | ICD-10-CM | POA: Insufficient documentation

## 2018-11-30 DIAGNOSIS — E1142 Type 2 diabetes mellitus with diabetic polyneuropathy: Secondary | ICD-10-CM | POA: Diagnosis not present

## 2018-11-30 DIAGNOSIS — R531 Weakness: Secondary | ICD-10-CM | POA: Insufficient documentation

## 2018-11-30 DIAGNOSIS — R0902 Hypoxemia: Secondary | ICD-10-CM | POA: Diagnosis not present

## 2018-11-30 DIAGNOSIS — L97511 Non-pressure chronic ulcer of other part of right foot limited to breakdown of skin: Secondary | ICD-10-CM | POA: Diagnosis not present

## 2018-11-30 DIAGNOSIS — R197 Diarrhea, unspecified: Secondary | ICD-10-CM | POA: Insufficient documentation

## 2018-11-30 DIAGNOSIS — R42 Dizziness and giddiness: Secondary | ICD-10-CM | POA: Diagnosis not present

## 2018-11-30 DIAGNOSIS — R0602 Shortness of breath: Secondary | ICD-10-CM | POA: Insufficient documentation

## 2018-11-30 DIAGNOSIS — Z794 Long term (current) use of insulin: Secondary | ICD-10-CM | POA: Insufficient documentation

## 2018-11-30 DIAGNOSIS — L03116 Cellulitis of left lower limb: Secondary | ICD-10-CM | POA: Diagnosis not present

## 2018-11-30 DIAGNOSIS — L97429 Non-pressure chronic ulcer of left heel and midfoot with unspecified severity: Secondary | ICD-10-CM | POA: Diagnosis not present

## 2018-11-30 DIAGNOSIS — L97412 Non-pressure chronic ulcer of right heel and midfoot with fat layer exposed: Secondary | ICD-10-CM | POA: Diagnosis not present

## 2018-11-30 DIAGNOSIS — E11621 Type 2 diabetes mellitus with foot ulcer: Secondary | ICD-10-CM | POA: Insufficient documentation

## 2018-11-30 DIAGNOSIS — R110 Nausea: Secondary | ICD-10-CM | POA: Diagnosis not present

## 2018-11-30 DIAGNOSIS — L97422 Non-pressure chronic ulcer of left heel and midfoot with fat layer exposed: Secondary | ICD-10-CM | POA: Diagnosis not present

## 2018-11-30 DIAGNOSIS — Z87891 Personal history of nicotine dependence: Secondary | ICD-10-CM | POA: Diagnosis not present

## 2018-11-30 DIAGNOSIS — E1165 Type 2 diabetes mellitus with hyperglycemia: Secondary | ICD-10-CM | POA: Diagnosis not present

## 2018-11-30 LAB — CBC
HCT: 30.7 % — ABNORMAL LOW (ref 36.0–46.0)
Hemoglobin: 9.7 g/dL — ABNORMAL LOW (ref 12.0–15.0)
MCH: 26 pg (ref 26.0–34.0)
MCHC: 31.6 g/dL (ref 30.0–36.0)
MCV: 82.3 fL (ref 80.0–100.0)
Platelets: 345 10*3/uL (ref 150–400)
RBC: 3.73 MIL/uL — ABNORMAL LOW (ref 3.87–5.11)
RDW: 16.3 % — ABNORMAL HIGH (ref 11.5–15.5)
WBC: 11.8 10*3/uL — ABNORMAL HIGH (ref 4.0–10.5)
nRBC: 0 % (ref 0.0–0.2)

## 2018-11-30 LAB — BASIC METABOLIC PANEL
Anion gap: 9 (ref 5–15)
BUN: 24 mg/dL — ABNORMAL HIGH (ref 6–20)
CO2: 20 mmol/L — ABNORMAL LOW (ref 22–32)
Calcium: 8.9 mg/dL (ref 8.9–10.3)
Chloride: 99 mmol/L (ref 98–111)
Creatinine, Ser: 1.04 mg/dL — ABNORMAL HIGH (ref 0.44–1.00)
GFR calc Af Amer: >60 mL/min (ref >60)
GFR calc non Af Amer: >60 mL/min (ref >60)
Glucose, Bld: 265 mg/dL — ABNORMAL HIGH (ref 70–99)
Potassium: 4.8 mmol/L (ref 3.5–5.1)
Sodium: 128 mmol/L — ABNORMAL LOW (ref 135–145)

## 2018-11-30 LAB — TROPONIN I (HIGH SENSITIVITY): Troponin I (High Sensitivity): 4 ng/L (ref <18)

## 2018-11-30 LAB — LACTIC ACID, PLASMA: Lactic Acid, Venous: 1.8 mmol/L (ref 0.5–1.9)

## 2018-11-30 NOTE — ED Triage Notes (Addendum)
Arrived by EMS from home. Admitted June-July due to sepsis from wound on left foot. Home health has been coming to change dressing. C/o SOB, dizziness, weakness, and diarrhea the last four days. EMS blood sugar 321. EMS vitals 118/80b/p, 98.7oral EMS administered 400cc fluid and 4 zofran IV. Patient wearing 2L O2 upon arrival for low 90s. Foul smell coming from wound

## 2018-11-30 NOTE — ED Notes (Signed)
Rn spoke with pt, apologized for the wait informed we are working in getting an exam room to bee seen by MD

## 2018-11-30 NOTE — ED Notes (Signed)
Patient assisted to the bathroom. Patient given update on wait time. Patient verbalizes understanding.

## 2018-11-30 NOTE — ED Notes (Signed)
Patient asking about wait time. Patient given update on wait time. Patient verbalizes understanding.

## 2018-12-01 ENCOUNTER — Emergency Department: Payer: Medicare Other

## 2018-12-01 ENCOUNTER — Emergency Department
Admission: EM | Admit: 2018-12-01 | Discharge: 2018-12-01 | Disposition: A | Discharge status: Home / Self Care | Payer: Medicare Other | Attending: Emergency Medicine | Admitting: Emergency Medicine

## 2018-12-01 DIAGNOSIS — R197 Diarrhea, unspecified: Secondary | ICD-10-CM

## 2018-12-01 DIAGNOSIS — L97429 Non-pressure chronic ulcer of left heel and midfoot with unspecified severity: Secondary | ICD-10-CM | POA: Diagnosis not present

## 2018-12-01 MED ORDER — SODIUM CHLORIDE 0.9 % IV BOLUS
1000.0000 mL | Freq: Once | INTRAVENOUS | Status: AC
  Administered 2018-12-01: 01:00:00 1000 mL via INTRAVENOUS

## 2018-12-01 NOTE — ED Notes (Signed)
Clean bandage applied to left foot per MD order

## 2018-12-01 NOTE — Discharge Instructions (Addendum)
It is not unusual to have diarrhea when you are taking antibiotics, it does not sound like you are having watery diarrhea, and your blood work is reassuring.  Please drink plenty of fluids.  Consider trying probiotics.  Please follow closely with your primary care doctor.  Return to the emergency room for any new or worsening symptoms including bleeding, dehydration, severe pain, fever, or any other concerns.

## 2018-12-01 NOTE — ED Notes (Signed)
Reviewed discharge instructions, follow-up care, and prescriptions with patient. Patient verbalized understanding of all information reviewed. Patient stable, with no distress noted at this time.    

## 2018-12-01 NOTE — ED Provider Notes (Addendum)
[facility] Emergency Department Provider Note  ____________________________________________   I have reviewed the triage vital signs and the nursing notes. Where available I have reviewed prior notes and, if possible and indicated, outside hospital notes.   Patient seen and evaluated during the coronavirus epidemic during a time with low staffing  Patient seen for the symptoms described in the history of present illness. She was evaluated in the context of the global COVID-19 pandemic, which necessitated consideration that the patient might be at risk for infection with the SARS-CoV-2 virus that causes COVID-19. Institutional protocols and algorithms that pertain to the evaluation of patients at risk for COVID-19 are in a state of rapid change based on information released by regulatory bodies including the CDC and federal and state organizations. These policies and algorithms were followed during the patient's care in the ED.    HISTORY  Chief Complaint Shortness of Breath, Weakness, Dizziness, and Wound Infection    HPI [name] is a 53 y.o. female with the problem list listed below, has chronic leg ulcer which she states is unchanged from baseline, she is seeing a wound care nurse who comes every day and who did dress it today, patient is here because she is having some diarrhea.  She is does not think she gives a sample today.   She denies any melena bright red blood per rectum, she feels little bit dehydrated.  She is on antibiotics for her foot.  She states she has had no fever or focal abdominal pain.  Despite the triage note she is not short of breath.  Patient does have a history of schizophrenia.  No SI no HI.  Has a history of poorly controlled diabetes mellitus. Is not throwing up.  Past Medical History:  Diagnosis Date  . Depression   . Diabetes mellitus without complication ([place])   . Hyperlipidemia   . Hypertension   . Neuromuscular  disorder ([place])   . Schizo affective schizophrenia ([place])    abilify and pazil    Patient Active Problem List   Diagnosis Date Noted  . Left leg cellulitis 10/18/2018  . Pyelonephritis 12/14/2017  . Morbid obesity ([place]) 05/10/2017  . Long-term use of high-risk medication 05/10/2017  . Hyperlipidemia associated with type 2 diabetes mellitus ([place]) 06/27/2016  . Hammer toe of second toe of right foot 04/20/2016  . Essential hypertension 06/29/2015  . Schizoaffective disorder ([place]) 06/29/2015  . DM (diabetes mellitus), type 2, 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[place]) 76/22/6333  . Polyneuropathy 04/02/2014    Past Surgical History:  Procedure Laterality Date  . CHOLECYSTECTOMY    . IRRIGATION AND DEBRIDEMENT FOOT Right 02/26/2016   Procedure: RIGHT 2ND TOE DEBRIDEMENT;  Surgeon: Samara Deist, DPM;  Location: [facility] ORS;  Service: Podiatry;  Laterality: Right;    Prior to Admission medications   Medication Sig Start Date End Date Taking? Authorizing Provider  acetaminophen (TYLENOL) 325 MG tablet Take 2 tablets (650 mg total) by mouth every 6 (six) hours as needed for mild pain (or Fever >/= 101). Patient not taking: Reported on 10/26/2018 12/16/17   Nicholes Mango, MD  amoxicillin-clavulanate (AUGMENTIN) 875-125 MG tablet Take 1 tablet by mouth 2 (two) times daily. For 10 day 11/27/18   [name], DO  ARIPiprazole (ABILIFY) 10 MG tablet Take 10 mg by mouth daily.    [provider]  atorvastatin (LIPITOR) 40 MG tablet Take 40 mg by mouth daily.    [provider]  B-D UF III MINI  PEN NEEDLES 31G X 5 MM MISC USE AS DIRECTED 08/07/18   Parks Ranger, Devonne Doughty, DO  Blood Glucose Monitoring Suppl (ONETOUCH VERIO) w/Device KIT Use to check blood sugar up to 3 x daily 10/29/18   Karamalegos, Devonne Doughty, DO  HUMALOG KWIKPEN 100 UNIT/ML KwikPen Inject 0.05 mLs (5 Units total) into the skin 3 (three) times daily with meals. + Sliding scale 03/23/18   Karamalegos,  Devonne Doughty, DO  Insulin Glargine (LANTUS SOLOSTAR) 100 UNIT/ML Solostar Pen Inject 50 Units into the skin daily. Adjust dose as advised Patient taking differently: Inject 52 Units into the skin daily.  12/27/17   Karamalegos, Devonne Doughty, DO  Lancets [facility] ULTRASOFT) lancets Use to check blood sugar up to 3 x daily 10/29/18   Karamalegos, Devonne Doughty, DO  lisinopril (PRINIVIL,ZESTRIL) 20 MG tablet TAKE 1 TABLET(20 MG) BY MOUTH DAILY Patient taking differently: Take 20 mg by mouth daily.  05/30/18   Karamalegos, Devonne Doughty, DO  metFORMIN (GLUCOPHAGE) 1000 MG tablet TAKE 1 TABLET(1000 MG) BY MOUTH TWICE DAILY WITH A MEAL 06/25/18   Karamalegos, Devonne Doughty, DO  ONETOUCH VERIO test strip Check blood sugar up to 3 x daily as needed 10/29/18   [name], DO  PARoxetine (PAXIL) 20 MG tablet Take 1 tablet (20 mg total) by mouth daily. Prescribed by [facility]    Karamalegos, Devonne Doughty, DO  sulfamethoxazole-trimethoprim (BACTRIM DS) 800-160 MG tablet TK 1 T PO Q 12 H 10/19/18   [provider]    Allergies Patient has no known allergies.  Family History  Problem Relation Age of Onset  . Breast cancer Cousin        maternal cousin  . Hypertension Mother   . Hyperthyroidism Mother   . Diabetes Maternal Grandmother   . Hypertension Maternal Grandmother   . Cancer Maternal Grandmother        unknown type  . Diabetes Maternal Grandfather   . Hypertension Maternal Grandfather   . Diabetes Paternal Grandmother   . Hypertension Paternal Grandmother   . Diabetes Paternal Grandfather   . Hypertension Paternal Grandfather     Social History Social History   Tobacco Use  . Smoking status: Former Smoker    Packs/day: 3.00    Years: 30.00    Pack years: 90.00    Types: Cigarettes    Quit date: 05/02/2009    Years since quitting: 9.5  . Smokeless tobacco: Former Systems developer  . Tobacco comment: Pt reported  quitting in 2011  Substance Use Topics  . Alcohol use: No  . Drug  use: No    Review of Systems Constitutional: No fever/chills Eyes: No visual changes. ENT: No sore throat. No stiff neck no neck pain Cardiovascular: Denies chest pain. Respiratory: Denies shortness of breath. Gastrointestinal:   no vomiting.  No diarrhea.  No constipation. Genitourinary: Negative for dysuria. Musculoskeletal: Negative lower extremity swelling Skin: Negative for rash. Neurological: Negative for severe headaches, focal weakness or numbness.   ____________________________________________   PHYSICAL EXAM:  VITAL SIGNS: ED Triage Vitals  Enc Vitals Group     BP 11/30/18 1724 (!) 139/92     Pulse Rate 11/30/18 1724 (!) 104     Resp 11/30/18 1724 (!) 22     Temp 11/30/18 1722 99.1 F (37.3 C)     Temp Source 11/30/18 1722 Oral     SpO2 11/30/18 1724 100 %     Weight 11/30/18 1722 271 lb (122.9 kg)     Height 11/30/18 1722  5' 10.5" (1.791 m)     Head Circumference --      Peak Flow --      Pain Score 11/30/18 1722 0     Pain Loc --      Pain Edu? --      Excl. in [place]? --     Constitutional: Alert and oriented. Well appearing and in no acute distress. Eyes: Conjunctivae are normal Head: Atraumatic HEENT: No congestion/rhinnorhea. Mucous membranes are moist.  Oropharynx non-erythematous Neck:   Nontender with no meningismus, no masses, no stridor Cardiovascular: Normal rate, regular rhythm. Grossly normal heart sounds.  Good peripheral circulation. Respiratory: Normal respiratory effort.  No retractions. Lungs CTAB. Abdominal: Soft and nontender. No distention. No guarding no rebound Back:  There is no focal tenderness or step off.  there is no midline tenderness there are no lesions noted. there is no CVA tenderness Musculoskeletal: No lower extremity tenderness, no upper extremity tenderness. No joint effusions, no DVT signs strong distal pulses no edema Neurologic:  Normal speech and language. No gross focal neurologic deficits are appreciated.  Skin:   Skin is warm, dry and intact.  Chronic appearing right foot ulcer noted, no surrounding cellulitis, not tender to touch.  No erythema surrounding it.  No crepitus. Psychiatric: Mood and affect are normal. Speech and behavior are normal.  ____________________________________________   LABS (all labs ordered are listed, but only abnormal results are displayed)  Labs Reviewed  BASIC METABOLIC PANEL - Abnormal; Notable for the following components:      Result Value   Sodium 128 (*)    CO2 20 (*)    Glucose, Bld 265 (*)    BUN 24 (*)    Creatinine, Ser 1.04 (*)    All other components within normal limits  CBC - Abnormal; Notable for the following components:   WBC 11.8 (*)    RBC 3.73 (*)    Hemoglobin 9.7 (*)    HCT 30.7 (*)    RDW 16.3 (*)    All other components within normal limits  LACTIC ACID, PLASMA  LACTIC ACID, PLASMA  TROPONIN I (HIGH SENSITIVITY)    Pertinent labs  results that were available during my care of the patient were reviewed by me and considered in my medical decision making (see chart for details). ____________________________________________  EKG  I personally interpreted any EKGs ordered by me or triage Sinus tach rate 122 normal axis no acute ST elevation or depression unremarkable EKG otherwise besides rate in terms of ischemia. ____________________________________________  RADIOLOGY  Pertinent labs & imaging results that were available during my care of the patient were reviewed by me and considered in my medical decision making (see chart for details). If possible, patient and/or family made aware of any abnormal findings.  Dg Chest 2 View  Result Date: 11/30/2018 CLINICAL DATA:  Pt states that she was admitted June-July due to sepsis from wound on left foot. Home health has been coming to change dressing. C/o SOB, dizziness, weakness, and diarrhea the last four days EXAM: CHEST - 2 VIEW COMPARISON:  10/18/2018 FINDINGS: Cardiac silhouette is  normal in size. No mediastinal or hilar masses. There is no evidence of adenopathy. Linear opacity in is noted in the right middle lobe consistent with scarring or atelectasis, stable from prior exam. Remainder of the lungs is clear. No pleural effusion or pneumothorax. Skeletal structures are intact. IMPRESSION: No active cardiopulmonary disease. Electronically Signed   By: Lajean Manes M.D.   On: 11/30/2018  18:02   ____________________________________________    PROCEDURES  Procedure(s) performed: None  Procedures  Critical Care performed: None  ____________________________________________   INITIAL IMPRESSION / ASSESSMENT AND PLAN / ED COURSE  Pertinent labs & imaging results that were available during my care of the patient were reviewed by me and considered in my medical decision making (see chart for details).   Patient well-appearing in no acute distress presents today with feelings of being mildly dehydrated in the context of diarrhea.  Did ask her for a stool sample if she gives [place] 1 we will check for C. difficile etc. however she has not been able to give [place] one.  Her lactic is negative, no evidence of sepsis or significant derangement of her metabolic function.  Cardiac enzymes are negative despite 4 days of feeling poorly.  No reported GI bleed.  Chronic anemia noted.  No evidence of significantly elevated white count.  Her foot appears chronically infected there is a slight drainage from it, this is how it always looks she states.  Certainly does not look to be cellulitic or acutely inflamed at this time.  She is actively followed by podiatry and home care for this.  That is not why she is here.  Last echo suggest she can take IV fluid we will give her a liter of fluid to see if that makes her feel better and if she gives [place] a stool sample hopefully we can check for concomitant infection from her antibiotics.  Otherwise she will require I think outpatient follow-up.  He is asking  for an outpatient coronavirus test which we can do  ----------------------------------------- 1:15 AM on 12/01/2018 -----------------------------------------  I did call and leave a HIPAA compliant message with her [name].  ----------------------------------------- 2:19 AM on 12/01/2018 ----------------------------------------- Still no return call from [name] that I have received.  Patient is stating that she actually is not having watery diarrhea just "a little loose" this certainly could be antibiotics as she diarrhea but no evidence of C. difficile.  Unable to give a stool sample despite prolonged stay in the emergency room.  She is eager to go home.  Wound has the aspect of a chronic diabetic ulcer but no evidence of decompensation and we will defer to outpatient podiatry further care of her.  She has not actually here for that.  She does have daily checks.  No other acute issues today noted and patient is unable to give [place] a stool sample.  She is not short of breath her vital signs are reassuring we will discharge.  ----------------------------------------- 3:03 AM on 12/01/2018 -----------------------------------------  [name] called back and we had a 20-minute conversation about the patient's general health.  It appears the patient has been having some loose but not watery diarrhea and just feels a little unwell over the last couple days.  There is no reported change in the foot itself.  I did offer to get an x-ray as a stated is something that they are wanting to do as an outpatient of the foot however it is hard for him to get somewhere where that can be done.  Otherwise today and I am reassured by her work-up.  Patient's sodium was low but we did give her fluid which I think should help at all think and sodium 128 mandates admission and I think it is probably better now.  There is no indication of ongoing sepsis, she does have an elevated BUN/creatinine ratio which suggest dehydration,  and her symptoms are fully  resolved with IV fluid.  Patient's hemoglobin A1c was 13 when last checked, 1 month ago giving her an average blood glucose between 303 25.  And family are wondering why the ulcer is taking so long to heal and I do suggest that continued glycemic control will be helpful for this.  They voiced understanding.  I did explain to them that given hospital precautions we cannot get a stat COVID test but she does not seem to have any symptoms of it necessarily although sometimes loose stools can be covered I think this is more likely related to her high biotics and in any event patient has had no evidence of diarrhea over the 9 hours she has been here.  We will discharge as soon as her foot x-ray is read if there is no osteo-.    ____________________________________________   FINAL CLINICAL IMPRESSION(S) / ED DIAGNOSES  Final diagnoses:  None      This chart was dictated using voice recognition software.  Despite best efforts to proofread,  errors can occur which can change meaning.      Schuyler Amor, MD 12/01/18 0109    Schuyler Amor, MD 12/01/18 2824    Schuyler Amor, MD 12/01/18 0221    Schuyler Amor, MD 12/01/18 [phone]

## 2018-12-02 LAB — NOVEL CORONAVIRUS, NAA (HOSP ORDER, SEND-OUT TO REF LAB; TAT 18-24 HRS): SARS-CoV-2, NAA: NOT DETECTED

## 2018-12-02 NOTE — Patient Instructions (Signed)
Thank you allowing the Chronic Care Management Team to be a part of your care! It was a pleasure speaking with you today!  CCM (Chronic Care Management) Team   Javiana Anwar RN, BSN Nurse Care Coordinator  [phone]  Harlow Asa PharmD  Clinical Pharmacist  [phone]  [name] LCSW Clinical Social Worker [phone]  Goals Addressed            This Visit's Progress   . RNCM- I want to get my diabetes back on track (pt-stated)       Current Barriers:  [name] Knowledge Deficits related to basic Diabetes pathophysiology and self care/management . Knowledge Deficits related to medications used for management of diabetes . Does not use cbg meter   Case Manager Clinical Goal(s):  Over the next 90 days, patient will demonstrate improved adherence to prescribed treatment plan for diabetes self care/management as evidenced by:  . daily monitoring and recording of CBG  . adherence to ADA/ carb modified diet . exercise 3 days/week . adherence to prescribed medication regimen  Interventions:  . Reviewed with patient her recent cbg which was 250 at noon today after a meal.  . Reviewed upcoming Endocrinologist  labs appt 8/17 office visit 8/24 . Sister [name] in the room and patient wanted me to speak with her too. . Patient an sister report patient feeling dizzy and fatigued at times and wanted to discuss medications. Requested permission to put CCM Pharmacist on the call to discuss. . 3-way phone call with CCM pharm D, patient and patient's sister. Patient's sister expresses great concern for patient, stating she will not take her meal time insulin, patient does not know why she won't she just does not want to. Sister reports patient has gained 45lbs, has been sneaking off with their >80y/o mother to fast food restaurants. The family has decided to take her keys, and provide meals on a weekly basis. Sister is concerned patient is starting to have a bipolar episode  related to she has seen this before and is recognizing some of the same behaviors; Such as spending over 800$ on fast food in the last month.  . Pharm-D and this RNCM reiterate the importance of contacting the patient's mental health provider ASAP to discuss these behaviors and need for assistance. Sister verbalized they had the number and understanding. Did report she has an appt with Dr. Rosaura Carpenter on 8/5, reinforced the importance of  making this appointment and sister going if possible. [name] Pharm-D and this RNCM discuss the importance of going to upcoming Endocrinology appointment to discuss other options to control high A1C to asssit with the healing process of foot wounds.  . Patient and sister appreciative of the call and discussion. [name] CCM LCSW referral placed to f/u with sister to for caregiver resources.    Patient Self Care Activities:  . UNABLE to independently self manage diabetes as evidenced by A1C >12  Please see past updates related to this goal by clicking on the "Past Updates" button in the selected goal         The patient verbalized understanding of instructions provided today and declined a print copy of patient instruction materials.   The patient has been provided with contact information for the care management team and has been advised to call with any health related questions or concerns.

## 2018-12-02 NOTE — Chronic Care Management (AMB) (Signed)
Chronic Care Management   Follow Up Note   11/29/2018 Name: [name] MRN: [id number] DOB: December 20, 1965  Referred by: [name], DO Reason for referral : Chronic Care Management (DM, Foot wound) and Care Coordination (Pharmacist)   [name] is a 53 y.o. year old female who is a primary care patient of [name], DO. The CCM team was consulted for assistance with chronic disease management and care coordination needs.    Review of patient status, including review of consultants reports, relevant laboratory and other test results, and collaboration with appropriate care team members and the patient's provider was performed as part of comprehensive patient evaluation and provision of chronic care management services.    Goals Addressed            This Visit's Progress   . RNCM- I want to get my diabetes back on track (pt-stated)       Current Barriers:  [name] Knowledge Deficits related to basic Diabetes pathophysiology and self care/management . Knowledge Deficits related to medications used for management of diabetes . Does not use cbg meter   Case Manager Clinical Goal(s):  Over the next 90 days, patient will demonstrate improved adherence to prescribed treatment plan for diabetes self care/management as evidenced by:  . daily monitoring and recording of CBG  . adherence to ADA/ carb modified diet . exercise 3 days/week . adherence to prescribed medication regimen  Interventions:  . Reviewed with patient her recent cbg which was 250 at noon today after a meal.  . Reviewed upcoming Endocrinologist  labs appt 8/17 office visit 8/24 . Sister [name] in the room and patient wanted me to speak with her too. . Patient and sister report patient feeling dizzy and fatigued at times and wanted to discuss medications and S/S of high sugars. Requested permission to put CCM Pharmacist on the call to discuss. . 3-way phone call with CCM pharm  D, patient and patient's sister. Patient's sister expresses great concern for patient, stating she will not take her meal time insulin, patient does not know why she won't she just does not want to. Sister reports patient has gained 45lbs, has been sneaking off with their >80y/o mother to fast food restaurants. The family has decided to take her keys related to safety, and provide meals on a weekly basis. Sister is concerned patient is starting to have a bipolar episode related to she has seen this before and is recognizing some of the same behaviors; Such as spending over 800$ on fast food in the last month. (Brother will now be managing patient's money)  . Pharm-D and this RNCM reiterate the importance of contacting the patient's mental health provider ASAP to discuss these behaviors and need for assistance. Sister verbalized they had the number and understanding. Did report she has an appt with Dr. Rosaura Carpenter on 8/5, reinforced the importance of  making this appointment and sister going if possible. [name] Pharm-D and this RNCM discuss the importance of going to upcoming Endocrinology appointment to discuss other options to control high A1C to asssit with the healing process of foot wounds.  . Patient and sister appreciative of the call and discussion. [name] CCM LCSW referral placed to f/u with sister to for caregiver resources.    Patient Self Care Activities:  . UNABLE to independently self manage diabetes as evidenced by A1C >12  Please see past updates related to this goal by clicking on the "Past Updates" button in the selected  goal          The care management team will reach out to the patient again over the next 30 days.  The patient has been provided with contact information for the care management team and has been advised to call with any health related questions or concerns.    [name] [name] RN, BSN Nurse Case Pharmacist, [facility]  [phone]) Business Mobile

## 2018-12-03 ENCOUNTER — Telehealth: Payer: Self-pay

## 2018-12-03 ENCOUNTER — Telehealth: Payer: Self-pay | Admitting: Family Medicine

## 2018-12-03 ENCOUNTER — Ambulatory Visit: Payer: Self-pay | Admitting: *Deleted

## 2018-12-03 DIAGNOSIS — L97429 Non-pressure chronic ulcer of left heel and midfoot with unspecified severity: Secondary | ICD-10-CM

## 2018-12-03 DIAGNOSIS — E11621 Type 2 diabetes mellitus with foot ulcer: Secondary | ICD-10-CM

## 2018-12-03 DIAGNOSIS — E1149 Type 2 diabetes mellitus with other diabetic neurological complication: Secondary | ICD-10-CM

## 2018-12-03 DIAGNOSIS — IMO0002 Reserved for concepts with insufficient information to code with codable children: Secondary | ICD-10-CM

## 2018-12-03 NOTE — Chronic Care Management (AMB) (Signed)
  Chronic Care Management   Follow Up Note   12/03/2018 Name: [name] MRN: [id number] DOB: Oct 04, 1965  Referred by: [name], DO Reason for referral : Chronic Care Management (DM2? Recent dizziness) and Care Coordination (Collaboration with HH )   [name] is a 53 y.o. year old female who is a primary care patient of [name], DO. The CCM team was consulted for assistance with chronic disease management and care coordination needs.    Review of patient status, including review of consultants reports, relevant laboratory and other test results, and collaboration with appropriate care team members and the patient's provider was performed as part of comprehensive patient evaluation and provision of chronic care management services.    Goals Addressed            This Visit's Progress   . RNCM- I want to get my diabetes back on track (pt-stated)       Current Barriers:  [name] Knowledge Deficits related to basic Diabetes pathophysiology and self care/management . Knowledge Deficits related to medications used for management of diabetes . Does not use cbg meter   Case Manager Clinical Goal(s):  Over the next 90 days, patient will demonstrate improved adherence to prescribed treatment plan for diabetes self care/management as evidenced by:  . daily monitoring and recording of CBG  . adherence to ADA/ carb modified diet . exercise 3 days/week . adherence to prescribed medication regimen  Interventions:  . EMR reveals patient was in the ED over the weekend.  . Placed a call to patient and sister, unable to reach either one but left a voicemail for each of them requesting a call back.  . Placed a collaboration call to [place] [phone], verbal order to increase patient's nurse visits to 3 times a week related to recent signs of infection to foot wound. Discussed patient's noncompliance with meal time insulins and  continued blood sugars over 250. Requested [facility] nurse encourage and remind patient of the importance of going to her endocrinology appointment 8/17 and 8/24. [facility] nurse reports having a good rapport with patient and will help her with this.  [name] [place] RN reports she was able to see some improvement in patient's foot wound with MD's addition of Augmentin and she had also changed the dressing type to Iodasorb. She has a scheduled visit with the patient this week.    Patient Self Care Activities:  . UNABLE to independently self manage diabetes as evidenced by A1C >12  Please see past updates related to this goal by clicking on the "Past Updates" button in the selected goal          A HIPPA compliant phone message was left for the patient providing contact information and requesting a return call.  The care management team will reach out to the patient again over the next 30 days.  The patient has been provided with contact information for the care management team and has been advised to call with any health related questions or concerns.    [name] Deshannon Hinchliffe RN, BSN Nurse Case Pharmacist, [facility]  [phone]) Business Mobile

## 2018-12-03 NOTE — Telephone Encounter (Signed)
Patient called in for her Covid-19 test results.  She was told Covid was Not Detected.

## 2018-12-04 ENCOUNTER — Ambulatory Visit: Payer: Self-pay | Admitting: Pharmacist

## 2018-12-04 ENCOUNTER — Ambulatory Visit: Payer: Self-pay | Admitting: Licensed Clinical Social Worker

## 2018-12-04 DIAGNOSIS — E11621 Type 2 diabetes mellitus with foot ulcer: Secondary | ICD-10-CM | POA: Diagnosis not present

## 2018-12-04 DIAGNOSIS — L97422 Non-pressure chronic ulcer of left heel and midfoot with fat layer exposed: Secondary | ICD-10-CM | POA: Diagnosis not present

## 2018-12-04 DIAGNOSIS — IMO0002 Reserved for concepts with insufficient information to code with codable children: Secondary | ICD-10-CM

## 2018-12-04 DIAGNOSIS — E1142 Type 2 diabetes mellitus with diabetic polyneuropathy: Secondary | ICD-10-CM | POA: Diagnosis not present

## 2018-12-04 DIAGNOSIS — Z87891 Personal history of nicotine dependence: Secondary | ICD-10-CM | POA: Diagnosis not present

## 2018-12-04 DIAGNOSIS — Z794 Long term (current) use of insulin: Secondary | ICD-10-CM | POA: Diagnosis not present

## 2018-12-04 DIAGNOSIS — E785 Hyperlipidemia, unspecified: Secondary | ICD-10-CM | POA: Diagnosis not present

## 2018-12-04 DIAGNOSIS — L03116 Cellulitis of left lower limb: Secondary | ICD-10-CM | POA: Diagnosis not present

## 2018-12-04 DIAGNOSIS — L97412 Non-pressure chronic ulcer of right heel and midfoot with fat layer exposed: Secondary | ICD-10-CM | POA: Diagnosis not present

## 2018-12-04 DIAGNOSIS — I10 Essential (primary) hypertension: Secondary | ICD-10-CM | POA: Diagnosis not present

## 2018-12-04 DIAGNOSIS — E1165 Type 2 diabetes mellitus with hyperglycemia: Secondary | ICD-10-CM | POA: Diagnosis not present

## 2018-12-04 DIAGNOSIS — G709 Myoneural disorder, unspecified: Secondary | ICD-10-CM | POA: Diagnosis not present

## 2018-12-04 DIAGNOSIS — E1149 Type 2 diabetes mellitus with other diabetic neurological complication: Secondary | ICD-10-CM

## 2018-12-04 NOTE — Patient Instructions (Signed)
Thank you allowing the Chronic Care Management Team to be a part of your care! It was a pleasure speaking with you today!     CCM (Chronic Care Management) Team    Janci Minor RN, BSN Nurse Care Coordinator  [phone]   Harlow Asa PharmD  Clinical Pharmacist  [phone]   [name] LCSW Clinical Social Worker [phone]  Visit Information  Goals Addressed            This Visit's Progress   . Medication Management       Current Barriers:  . Financial Barriers . Non adherence to prescribed medication regimen  Pharmacist Clinical Goal(s):  [name] Over the next 30 days, patient will work with [name] to address needs related to optimization of medication regimen and improvement of medication adherence  Interventions: . Counsel on importance of medication adherence, particularly to insulin therapy o Reports that she is taking her Augmentin 875-125 mg twice daily as directed. Remind patient to take with food and complete antibiotic course o Reports using her weekly pillbox. Encourage patient to refill this with the aid of her sister o Reports that her morning fasting blood sugar was 222 mg/dL o Ms. Bracco states that she continues to feel like it is mentally difficult for her to take the meal time insulin. Not taking it yet with every meal as directed . Reports that she has a virtual appointment scheduled for tomorrow with her mental health provider regarding management of psychiatric medications, discussion of mood. Reports that her sister will join her for this call . Remind patient of upcoming appointment with Endocrinologist and the importance of keeping this appointment . Patient reports that she is planning to return call to [place] worker today . Reports she will call to schedule follow up appointment with PCP today . Collaborate with RN Case Manager  Patient Self Care Activities:  . Self administers medications as prescribed o Using weekly  pillbox and calendar as adherence tools . Attends all scheduled provider appointments o Psychiatry appointment: 8/5 o Endocrinology appointments: 8/17 (lab work) & 8/24 . Calls pharmacy for medication refills o Patient to call [facility] Over The Counter benefit regarding size large blood pressure monitor cuff . Calls provider office for new concerns or questions . Patient to check blood sugar as directed by Endocrinologist and keep log  Please see past updates related to this goal by clicking on the "Past Updates" button in the selected goal         The patient verbalized understanding of instructions provided today and declined a print copy of patient instruction materials.   The care management team will reach out to the patient again over the next 7 days.   Harlow Asa, PharmD, [place] [employer] [phone]

## 2018-12-04 NOTE — Chronic Care Management (AMB) (Signed)
  Chronic Care Management    Clinical Social Work Follow Up Note  12/04/2018 Name: AMARYAH MALLEN MRN: [id number] DOB: [birth year]  CLAY MENSER is a 53 y.o. year old female who is a primary care patient of [name], DO. The CCM team was consulted for assistance with Mental Health Counseling and Resources.   Review of patient status, including review of consultants reports, other relevant assessments, and collaboration with appropriate care team members and the patient's provider was performed as part of comprehensive patient evaluation and provision of chronic care management services.     LCSW completed CCM outreach attempt today but was unable to reach patient successfully. A HIPPA compliant voice message was left encouraging patient to return call once available. LCSW rescheduled CCM SW appointment as well.   Follow Up Plan: SW will follow up with patient by phone over the next 30 days  [name], [employer], MSW, [email or web address] Phone: [phone]

## 2018-12-04 NOTE — Chronic Care Management (AMB) (Signed)
  Chronic Care Management   Follow Up Note   12/04/2018 Name: [name] MRN: [id number] DOB: 09/05/1965  Referred by: [name], DO Reason for referral : Chronic Care Management (Patient Phone Call)   [name] is a 53 y.o. year old female who is a primary care patient of [name], DO. The CCM team was consulted for assistance with chronic disease management and care coordination needs.  [name] has a past medical history including but not limited to type 2 diabetes, hypertension, hyperlipidemia and recent hospitalization for left lower extremity cellulitis with bilateral heel infection, sepsis (10/2018).  I reached out to [name] by phone today. Speak with patient, but then phone call is cut short when patient reports that her brother has arrived to pick her up to run errands.  Review of patient status, including review of consultants reports, relevant laboratory and other test results, and collaboration with appropriate care team members and the patient's provider was performed as part of comprehensive patient evaluation and provision of chronic care management services.    Goals Addressed            This Visit's Progress   . Medication Management       Current Barriers:  . Financial Barriers . Non adherence to prescribed medication regimen  Pharmacist Clinical Goal(s):  [name] Over the next 30 days, patient will work with [name] to address needs related to optimization of medication regimen and improvement of medication adherence  Interventions: . Counsel on importance of medication adherence, particularly to insulin therapy o Reports that she is taking her Augmentin 875-125 mg twice daily as directed. Remind patient to take with food and complete antibiotic course o Reports using her weekly pillbox. Encourage patient to refill this with the aid of her sister o Reports that her morning fasting blood sugar was 222  mg/dL o [name] states that she continues to feel like it is mentally difficult for her to take the meal time insulin. Not taking it yet with every meal as directed . Reports that she has a virtual appointment scheduled for tomorrow with her mental health provider regarding management of psychiatric medications, discussion of mood. Reports that her sister will join her for this call . Remind patient of upcoming appointment with Endocrinologist and the importance of keeping this appointment . Patient reports that she is planning to return call to [place] worker today . Reports she will call to schedule follow up appointment with PCP today . Collaborate with RN Case Manager  Patient Self Care Activities:  . Self administers medications as prescribed o Using weekly pillbox and calendar as adherence tools . Attends all scheduled provider appointments o Psychiatry appointment: 8/5 o Endocrinology appointments: 8/17 (lab work) & 8/24 . Calls pharmacy for medication refills o Patient to call [facility] Over [employer] Counter benefit regarding size large blood pressure monitor cuff . Calls provider office for new concerns or questions . Patient to check blood sugar as directed by Endocrinologist and keep log  Please see past updates related to this goal by clicking on the "Past Updates" button in the selected goal         Plan  The care management team will reach out to the patient again over the next 7 days.   Harlow Asa, PharmD, [place] [employer] [phone]

## 2018-12-05 ENCOUNTER — Telehealth: Payer: Self-pay

## 2018-12-05 NOTE — Telephone Encounter (Signed)
Patient advised will call Dr. Alvera Singh office and schedule an appointment and will relay message to her sister.

## 2018-12-05 NOTE — Telephone Encounter (Addendum)
Reviewed her chart.  She was discharged on Doxycycline for 10 days after leaving hospital in 10/2018.  She was referred to Dr Samara Deist Podiatry [facility]) for management of these wounds.  She followed up with him in July 2020 but did not keep her next apt.  They were managing the wound debridement and would usually recommend antibiotics if needed for foot wound.  My recommendation since it has been >6 weeks since her hospitalization, would be to return to Podiatry Dr Vickki Muff for re-evaluation to determine if she needs procedural treatment of wound or if oral antibiotics are needed.  Cherrie Gauze, DPM (Attending) DM: [phone] (Work) [phone] (Fax) [place], [place] [address] Podiatry  If she has any significant worsening foot infection symptoms - redness, fever, drainage of pus, severe pain or other changes - may warrant more prompt evaluation, she may need to return to hospital ED or urgent care if severe. If mild then could be evaluated here in office, but recommend Podiatry instead if they can get in there for this particular problem.  [name], DO [facility] 12/05/2018, 3:31 PM

## 2018-12-05 NOTE — Telephone Encounter (Signed)
[name]'s sister [name] states that patient was in the hospital for Sepsis in 10/2018 and got discharged from the hospital with antibiotics. She was going through her pill box on weekend and realized that patient never took this antibiotics and that could be the reason why her foot wound is not healing. She has Augmentin Rx by Dr. Raliegh Ip but as per patient she is getting sick so not taking regularly. Santiago Glad is asking to switch antibiotics. A number for Harlow Asa is given.

## 2018-12-06 ENCOUNTER — Telehealth: Payer: Self-pay

## 2018-12-06 ENCOUNTER — Ambulatory Visit: Payer: Medicare Other | Admitting: *Deleted

## 2018-12-06 DIAGNOSIS — E1165 Type 2 diabetes mellitus with hyperglycemia: Secondary | ICD-10-CM | POA: Diagnosis not present

## 2018-12-06 DIAGNOSIS — L03116 Cellulitis of left lower limb: Secondary | ICD-10-CM | POA: Diagnosis not present

## 2018-12-06 DIAGNOSIS — I10 Essential (primary) hypertension: Secondary | ICD-10-CM | POA: Diagnosis not present

## 2018-12-06 DIAGNOSIS — E1142 Type 2 diabetes mellitus with diabetic polyneuropathy: Secondary | ICD-10-CM | POA: Diagnosis not present

## 2018-12-06 DIAGNOSIS — E11621 Type 2 diabetes mellitus with foot ulcer: Secondary | ICD-10-CM | POA: Diagnosis not present

## 2018-12-06 DIAGNOSIS — L97422 Non-pressure chronic ulcer of left heel and midfoot with fat layer exposed: Secondary | ICD-10-CM | POA: Diagnosis not present

## 2018-12-06 DIAGNOSIS — Z794 Long term (current) use of insulin: Secondary | ICD-10-CM | POA: Diagnosis not present

## 2018-12-06 DIAGNOSIS — L97412 Non-pressure chronic ulcer of right heel and midfoot with fat layer exposed: Secondary | ICD-10-CM | POA: Diagnosis not present

## 2018-12-06 DIAGNOSIS — E1149 Type 2 diabetes mellitus with other diabetic neurological complication: Secondary | ICD-10-CM

## 2018-12-06 DIAGNOSIS — G709 Myoneural disorder, unspecified: Secondary | ICD-10-CM | POA: Diagnosis not present

## 2018-12-06 DIAGNOSIS — F251 Schizoaffective disorder, depressive type: Secondary | ICD-10-CM

## 2018-12-06 DIAGNOSIS — IMO0002 Reserved for concepts with insufficient information to code with codable children: Secondary | ICD-10-CM

## 2018-12-06 DIAGNOSIS — Z87891 Personal history of nicotine dependence: Secondary | ICD-10-CM | POA: Diagnosis not present

## 2018-12-06 DIAGNOSIS — L97429 Non-pressure chronic ulcer of left heel and midfoot with unspecified severity: Secondary | ICD-10-CM

## 2018-12-06 DIAGNOSIS — E785 Hyperlipidemia, unspecified: Secondary | ICD-10-CM | POA: Diagnosis not present

## 2018-12-06 NOTE — Patient Instructions (Signed)
Thank you allowing the Chronic Care Management Team to be a part of your care! It was a pleasure speaking with you today!  CCM (Chronic Care Management) Team   Domonick Sittner RN, BSN Nurse Care Coordinator  [phone]  Harlow Asa PharmD  Clinical Pharmacist  [phone]  [name] LCSW Clinical Social Worker [phone]  Goals Addressed            This Visit's Progress   . RNCM- I want to get my diabetes back on track (pt-stated)       Current Barriers:  [name] Knowledge Deficits related to basic Diabetes pathophysiology and self care/management . Knowledge Deficits related to medications used for management of diabetes . Does not use cbg meter   Case Manager Clinical Goal(s):  Over the next 90 days, patient will demonstrate improved adherence to prescribed treatment plan for diabetes self care/management as evidenced by:  . daily monitoring and recording of CBG  . adherence to ADA/ carb modified diet . exercise 3 days/week . adherence to prescribed medication regimen  Interventions:  . Placed a call to patient's sister without success, left VM. [name] Spoke to patient, patient states she went to see her psychiatrist yesterday and they changed her medication "To something that starts with a W and she is going to pick it up today with her sister.  . Patient reports this mornings fasting sugar was 140 and she was happy. She stated has been trying to remember her meal time insulin and is hopeful when she goes to see her Endocrinologist later this month 8/24 she will change her to something easier.  . Patient also reports she is going to see her Podiatrist 8/11. . Patient said she was trying to just eat the food her family is providing and over eat, she stated she really doesn't want to let everyone down who is trying to help her.    Patient Self Care Activities:  . UNABLE to independently self manage diabetes as evidenced by A1C >12  Please see past updates related to  this goal by clicking on the "Past Updates" button in the selected goal         The patient verbalized understanding of instructions provided today and declined a print copy of patient instruction materials.   The patient has been provided with contact information for the care management team and has been advised to call with any health related questions or concerns.

## 2018-12-06 NOTE — Chronic Care Management (AMB) (Signed)
  Chronic Care Management   Follow Up Note   12/06/2018 Name: [name] MRN: [id number] DOB: [birth year]  Referred by: [name], DO Reason for referral : Chronic Care Management (DM. Mental Health, Foot wounds )   [name] is a 53 y.o. year old female who is a primary care patient of [name], DO. The CCM team was consulted for assistance with chronic disease management and care coordination needs.    Review of patient status, including review of consultants reports, relevant laboratory and other test results, and collaboration with appropriate care team members and the patient's provider was performed as part of comprehensive patient evaluation and provision of chronic care management services.    Goals Addressed            This Visit's Progress   . RNCM- I want to get my diabetes back on track (pt-stated)       Current Barriers:  [name] Knowledge Deficits related to basic Diabetes pathophysiology and self care/management . Knowledge Deficits related to medications used for management of diabetes . Does not use cbg meter   Case Manager Clinical Goal(s):  Over the next 90 days, patient will demonstrate improved adherence to prescribed treatment plan for diabetes self care/management as evidenced by:  . daily monitoring and recording of CBG  . adherence to ADA/ carb modified diet . exercise 3 days/week . adherence to prescribed medication regimen  Interventions:  . Placed a call to patient's sister without success, left VM. [name] Spoke to patient, patient states she went to see her psychiatrist yesterday and they changed her medication "To something that starts with a W and she is going to pick it up today with her sister.  . Patient reports this mornings fasting sugar was 140 and she was happy. She stated has been trying to remember her meal time insulin and is hopeful when she goes to see her Endocrinologist later this month 8/24 she will  change her to something easier.  . Patient also reports she is going to see her Podiatrist 8/11. . Patient said she was trying to just eat the food her family is providing and over eat, she stated she really doesn't want to let everyone down who is trying to help her.    Patient Self Care Activities:  . UNABLE to independently self manage diabetes as evidenced by A1C >12  Please see past updates related to this goal by clicking on the "Past Updates" button in the selected goal          The care management team will reach out to the patient again over the next 30 days.  The patient has been provided with contact information for the care management team and has been advised to call with any health related questions or concerns.   [name] Maguire Killmer RN, BSN Nurse Case Pharmacist, [facility]  [phone]) Business [employer]

## 2018-12-07 ENCOUNTER — Ambulatory Visit: Payer: Medicare Other | Admitting: Family Medicine

## 2018-12-11 ENCOUNTER — Other Ambulatory Visit: Payer: Self-pay

## 2018-12-11 ENCOUNTER — Telehealth: Payer: Self-pay

## 2018-12-11 ENCOUNTER — Ambulatory Visit: Payer: Self-pay | Admitting: Pharmacist

## 2018-12-11 ENCOUNTER — Inpatient Hospital Stay
Admission: AD | Admit: 2018-12-11 | Discharge: 2018-12-17 | DRG: 629 | Disposition: A | Discharge status: Skilled Nursing Facility | Payer: Medicare Other | Attending: Internal Medicine | Admitting: Internal Medicine

## 2018-12-11 ENCOUNTER — Inpatient Hospital Stay: Payer: Medicare Other

## 2018-12-11 DIAGNOSIS — Z79899 Other long term (current) drug therapy: Secondary | ICD-10-CM

## 2018-12-11 DIAGNOSIS — Z833 Family history of diabetes mellitus: Secondary | ICD-10-CM

## 2018-12-11 DIAGNOSIS — Z803 Family history of malignant neoplasm of breast: Secondary | ICD-10-CM | POA: Diagnosis not present

## 2018-12-11 DIAGNOSIS — E871 Hypo-osmolality and hyponatremia: Secondary | ICD-10-CM | POA: Diagnosis not present

## 2018-12-11 DIAGNOSIS — Z87891 Personal history of nicotine dependence: Secondary | ICD-10-CM | POA: Diagnosis not present

## 2018-12-11 DIAGNOSIS — L97422 Non-pressure chronic ulcer of left heel and midfoot with fat layer exposed: Secondary | ICD-10-CM | POA: Diagnosis not present

## 2018-12-11 DIAGNOSIS — F419 Anxiety disorder, unspecified: Secondary | ICD-10-CM | POA: Diagnosis present

## 2018-12-11 DIAGNOSIS — E1165 Type 2 diabetes mellitus with hyperglycemia: Secondary | ICD-10-CM | POA: Diagnosis present

## 2018-12-11 DIAGNOSIS — Z8249 Family history of ischemic heart disease and other diseases of the circulatory system: Secondary | ICD-10-CM | POA: Diagnosis not present

## 2018-12-11 DIAGNOSIS — IMO0002 Reserved for concepts with insufficient information to code with codable children: Secondary | ICD-10-CM

## 2018-12-11 DIAGNOSIS — Z09 Encounter for follow-up examination after completed treatment for conditions other than malignant neoplasm: Secondary | ICD-10-CM

## 2018-12-11 DIAGNOSIS — G709 Myoneural disorder, unspecified: Secondary | ICD-10-CM | POA: Diagnosis not present

## 2018-12-11 DIAGNOSIS — M2550 Pain in unspecified joint: Secondary | ICD-10-CM | POA: Diagnosis not present

## 2018-12-11 DIAGNOSIS — L03116 Cellulitis of left lower limb: Secondary | ICD-10-CM | POA: Diagnosis not present

## 2018-12-11 DIAGNOSIS — Z9049 Acquired absence of other specified parts of digestive tract: Secondary | ICD-10-CM

## 2018-12-11 DIAGNOSIS — E11628 Type 2 diabetes mellitus with other skin complications: Secondary | ICD-10-CM | POA: Diagnosis present

## 2018-12-11 DIAGNOSIS — Z7401 Bed confinement status: Secondary | ICD-10-CM | POA: Diagnosis not present

## 2018-12-11 DIAGNOSIS — L97429 Non-pressure chronic ulcer of left heel and midfoot with unspecified severity: Secondary | ICD-10-CM | POA: Diagnosis present

## 2018-12-11 DIAGNOSIS — E119 Type 2 diabetes mellitus without complications: Secondary | ICD-10-CM | POA: Diagnosis not present

## 2018-12-11 DIAGNOSIS — G9009 Other idiopathic peripheral autonomic neuropathy: Secondary | ICD-10-CM | POA: Diagnosis not present

## 2018-12-11 DIAGNOSIS — E7849 Other hyperlipidemia: Secondary | ICD-10-CM | POA: Diagnosis not present

## 2018-12-11 DIAGNOSIS — B961 Klebsiella pneumoniae [K. pneumoniae] as the cause of diseases classified elsewhere: Secondary | ICD-10-CM | POA: Diagnosis not present

## 2018-12-11 DIAGNOSIS — B9561 Methicillin susceptible Staphylococcus aureus infection as the cause of diseases classified elsewhere: Secondary | ICD-10-CM | POA: Diagnosis not present

## 2018-12-11 DIAGNOSIS — L859 Epidermal thickening, unspecified: Secondary | ICD-10-CM | POA: Diagnosis not present

## 2018-12-11 DIAGNOSIS — M869 Osteomyelitis, unspecified: Secondary | ICD-10-CM | POA: Diagnosis present

## 2018-12-11 DIAGNOSIS — Z20828 Contact with and (suspected) exposure to other viral communicable diseases: Secondary | ICD-10-CM | POA: Diagnosis present

## 2018-12-11 DIAGNOSIS — E785 Hyperlipidemia, unspecified: Secondary | ICD-10-CM | POA: Diagnosis present

## 2018-12-11 DIAGNOSIS — Z794 Long term (current) use of insulin: Secondary | ICD-10-CM | POA: Diagnosis not present

## 2018-12-11 DIAGNOSIS — Z9119 Patient's noncompliance with other medical treatment and regimen: Secondary | ICD-10-CM | POA: Diagnosis not present

## 2018-12-11 DIAGNOSIS — E11621 Type 2 diabetes mellitus with foot ulcer: Secondary | ICD-10-CM | POA: Diagnosis not present

## 2018-12-11 DIAGNOSIS — F329 Major depressive disorder, single episode, unspecified: Secondary | ICD-10-CM | POA: Diagnosis present

## 2018-12-11 DIAGNOSIS — K0889 Other specified disorders of teeth and supporting structures: Secondary | ICD-10-CM | POA: Diagnosis not present

## 2018-12-11 DIAGNOSIS — S91302A Unspecified open wound, left foot, initial encounter: Secondary | ICD-10-CM | POA: Diagnosis not present

## 2018-12-11 DIAGNOSIS — R262 Difficulty in walking, not elsewhere classified: Secondary | ICD-10-CM | POA: Diagnosis not present

## 2018-12-11 DIAGNOSIS — E1142 Type 2 diabetes mellitus with diabetic polyneuropathy: Secondary | ICD-10-CM | POA: Diagnosis not present

## 2018-12-11 DIAGNOSIS — B351 Tinea unguium: Secondary | ICD-10-CM | POA: Diagnosis not present

## 2018-12-11 DIAGNOSIS — B9689 Other specified bacterial agents as the cause of diseases classified elsewhere: Secondary | ICD-10-CM | POA: Diagnosis not present

## 2018-12-11 DIAGNOSIS — M6281 Muscle weakness (generalized): Secondary | ICD-10-CM | POA: Diagnosis not present

## 2018-12-11 DIAGNOSIS — F258 Other schizoaffective disorders: Secondary | ICD-10-CM | POA: Diagnosis not present

## 2018-12-11 DIAGNOSIS — M86172 Other acute osteomyelitis, left ankle and foot: Secondary | ICD-10-CM | POA: Diagnosis not present

## 2018-12-11 DIAGNOSIS — F259 Schizoaffective disorder, unspecified: Secondary | ICD-10-CM | POA: Diagnosis present

## 2018-12-11 DIAGNOSIS — F339 Major depressive disorder, recurrent, unspecified: Secondary | ICD-10-CM | POA: Diagnosis not present

## 2018-12-11 DIAGNOSIS — E1169 Type 2 diabetes mellitus with other specified complication: Principal | ICD-10-CM | POA: Diagnosis present

## 2018-12-11 DIAGNOSIS — I10 Essential (primary) hypertension: Secondary | ICD-10-CM | POA: Diagnosis not present

## 2018-12-11 DIAGNOSIS — B964 Proteus (mirabilis) (morganii) as the cause of diseases classified elsewhere: Secondary | ICD-10-CM | POA: Diagnosis not present

## 2018-12-11 DIAGNOSIS — F2089 Other schizophrenia: Secondary | ICD-10-CM | POA: Diagnosis not present

## 2018-12-11 DIAGNOSIS — Z48817 Encounter for surgical aftercare following surgery on the skin and subcutaneous tissue: Secondary | ICD-10-CM | POA: Diagnosis not present

## 2018-12-11 DIAGNOSIS — L02612 Cutaneous abscess of left foot: Secondary | ICD-10-CM | POA: Diagnosis not present

## 2018-12-11 DIAGNOSIS — E1149 Type 2 diabetes mellitus with other diabetic neurological complication: Secondary | ICD-10-CM

## 2018-12-11 LAB — COMPREHENSIVE METABOLIC PANEL
ALT: 22 U/L (ref 0–44)
AST: 20 U/L (ref 15–41)
Albumin: 3.9 g/dL (ref 3.5–5.0)
Alkaline Phosphatase: 113 U/L (ref 38–126)
Anion gap: 10 (ref 5–15)
BUN: 17 mg/dL (ref 6–20)
CO2: 22 mmol/L (ref 22–32)
Calcium: 9.3 mg/dL (ref 8.9–10.3)
Chloride: 96 mmol/L — ABNORMAL LOW (ref 98–111)
Creatinine, Ser: 1.02 mg/dL — ABNORMAL HIGH (ref 0.44–1.00)
GFR calc Af Amer: >60 mL/min (ref >60)
GFR calc non Af Amer: >60 mL/min (ref >60)
Glucose, Bld: 105 mg/dL — ABNORMAL HIGH (ref 70–99)
Potassium: 5.1 mmol/L (ref 3.5–5.1)
Sodium: 128 mmol/L — ABNORMAL LOW (ref 135–145)
Total Bilirubin: 0.2 mg/dL — ABNORMAL LOW (ref 0.3–1.2)
Total Protein: 7.8 g/dL (ref 6.5–8.1)

## 2018-12-11 LAB — MRSA PCR SCREENING: MRSA by PCR: NEGATIVE

## 2018-12-11 LAB — CBC WITH DIFFERENTIAL/PLATELET
Abs Immature Granulocytes: 0.07 10*3/uL (ref 0.00–0.07)
Basophils Absolute: 0.1 10*3/uL (ref 0.0–0.1)
Basophils Relative: 1 %
Eosinophils Absolute: 0.3 10*3/uL (ref 0.0–0.5)
Eosinophils Relative: 2 %
HCT: 32.4 % — ABNORMAL LOW (ref 36.0–46.0)
Hemoglobin: 10.4 g/dL — ABNORMAL LOW (ref 12.0–15.0)
Immature Granulocytes: 1 %
Lymphocytes Relative: 23 %
Lymphs Abs: 2.7 10*3/uL (ref 0.7–4.0)
MCH: 25.9 pg — ABNORMAL LOW (ref 26.0–34.0)
MCHC: 32.1 g/dL (ref 30.0–36.0)
MCV: 80.6 fL (ref 80.0–100.0)
Monocytes Absolute: 0.8 10*3/uL (ref 0.1–1.0)
Monocytes Relative: 7 %
Neutro Abs: 8.2 10*3/uL — ABNORMAL HIGH (ref 1.7–7.7)
Neutrophils Relative %: 66 %
Platelets: 331 10*3/uL (ref 150–400)
RBC: 4.02 MIL/uL (ref 3.87–5.11)
RDW: 16 % — ABNORMAL HIGH (ref 11.5–15.5)
WBC: 12.2 10*3/uL — ABNORMAL HIGH (ref 4.0–10.5)
nRBC: 0 % (ref 0.0–0.2)

## 2018-12-11 LAB — GLUCOSE, CAPILLARY
Glucose-Capillary: 107 mg/dL — ABNORMAL HIGH (ref 70–99)
Glucose-Capillary: 174 mg/dL — ABNORMAL HIGH (ref 70–99)

## 2018-12-11 LAB — APTT: aPTT: 39 seconds — ABNORMAL HIGH (ref 24–36)

## 2018-12-11 LAB — PROTIME-INR
INR: 1 (ref 0.8–1.2)
Prothrombin Time: 12.7 seconds (ref 11.4–15.2)

## 2018-12-11 LAB — SARS CORONAVIRUS 2 BY RT PCR (HOSPITAL ORDER, PERFORMED IN ~~LOC~~ HOSPITAL LAB): SARS Coronavirus 2: NEGATIVE

## 2018-12-11 MED ORDER — BUPROPION HCL ER (XL) 150 MG PO TB24
150.0000 mg | ORAL_TABLET | Freq: Every day | ORAL | Status: DC
  Administered 2018-12-12 – 2018-12-17 (×5): 150 mg via ORAL
  Filled 2018-12-11 (×5): qty 1

## 2018-12-11 MED ORDER — INSULIN GLARGINE 100 UNIT/ML ~~LOC~~ SOLN
25.0000 [IU] | Freq: Every day | SUBCUTANEOUS | Status: DC
  Filled 2018-12-11: qty 0.25

## 2018-12-11 MED ORDER — PAROXETINE HCL 20 MG PO TABS
20.0000 mg | ORAL_TABLET | Freq: Every day | ORAL | Status: DC
  Administered 2018-12-11 – 2018-12-16 (×6): 20 mg via ORAL
  Filled 2018-12-11 (×6): qty 1

## 2018-12-11 MED ORDER — ARIPIPRAZOLE 10 MG PO TABS
10.0000 mg | ORAL_TABLET | Freq: Every day | ORAL | Status: DC
  Filled 2018-12-11: qty 1

## 2018-12-11 MED ORDER — ENOXAPARIN SODIUM 40 MG/0.4ML ~~LOC~~ SOLN: 40.0000 mg | SUBCUTANEOUS | Status: DC

## 2018-12-11 MED ORDER — INSULIN ASPART 100 UNIT/ML ~~LOC~~ SOLN
0.0000 [IU] | Freq: Every day | SUBCUTANEOUS | Status: DC
  Administered 2018-12-12 – 2018-12-16 (×4): 2 [IU] via SUBCUTANEOUS
  Filled 2018-12-11 (×4): qty 1

## 2018-12-11 MED ORDER — SODIUM CHLORIDE 0.9 % IV SOLN
2.0000 g | Freq: Three times a day (TID) | INTRAVENOUS | Status: DC
  Administered 2018-12-12 – 2018-12-17 (×16): 2 g via INTRAVENOUS
  Filled 2018-12-11 (×19): qty 2

## 2018-12-11 MED ORDER — INSULIN ASPART 100 UNIT/ML ~~LOC~~ SOLN
0.0000 [IU] | Freq: Three times a day (TID) | SUBCUTANEOUS | Status: DC
  Administered 2018-12-12: 2 [IU] via SUBCUTANEOUS
  Administered 2018-12-12 – 2018-12-13 (×2): 3 [IU] via SUBCUTANEOUS
  Administered 2018-12-13: 5 [IU] via SUBCUTANEOUS
  Administered 2018-12-14 (×2): 3 [IU] via SUBCUTANEOUS
  Administered 2018-12-14 – 2018-12-15 (×2): 5 [IU] via SUBCUTANEOUS
  Administered 2018-12-15 – 2018-12-16 (×3): 3 [IU] via SUBCUTANEOUS
  Administered 2018-12-16: 5 [IU] via SUBCUTANEOUS
  Administered 2018-12-16: 3 [IU] via SUBCUTANEOUS
  Administered 2018-12-17: 5 [IU] via SUBCUTANEOUS
  Administered 2018-12-17: 3 [IU] via SUBCUTANEOUS
  Filled 2018-12-11 (×17): qty 1

## 2018-12-11 MED ORDER — ARIPIPRAZOLE 10 MG PO TABS
10.0000 mg | ORAL_TABLET | Freq: Every day | ORAL | Status: DC
  Administered 2018-12-12 – 2018-12-17 (×6): 10 mg via ORAL
  Filled 2018-12-11 (×6): qty 1

## 2018-12-11 MED ORDER — VANCOMYCIN HCL IN DEXTROSE 750-5 MG/150ML-% IV SOLN
750.0000 mg | Freq: Two times a day (BID) | INTRAVENOUS | Status: DC
  Administered 2018-12-12 – 2018-12-17 (×11): 750 mg via INTRAVENOUS
  Filled 2018-12-11 (×13): qty 150

## 2018-12-11 MED ORDER — LISINOPRIL 20 MG PO TABS
20.0000 mg | ORAL_TABLET | Freq: Every day | ORAL | Status: DC
  Administered 2018-12-12 – 2018-12-14 (×2): 20 mg via ORAL
  Filled 2018-12-11 (×2): qty 1

## 2018-12-11 MED ORDER — SODIUM CHLORIDE 0.9 % IV SOLN
2.0000 g | Freq: Once | INTRAVENOUS | Status: AC
  Administered 2018-12-11: 2 g via INTRAVENOUS
  Filled 2018-12-11 (×2): qty 2

## 2018-12-11 MED ORDER — INSULIN GLARGINE 100 UNIT/ML ~~LOC~~ SOLN
25.0000 [IU] | Freq: Every day | SUBCUTANEOUS | Status: DC
  Administered 2018-12-14 – 2018-12-15 (×2): 25 [IU] via SUBCUTANEOUS
  Filled 2018-12-11 (×5): qty 0.25

## 2018-12-11 MED ORDER — ATORVASTATIN CALCIUM 20 MG PO TABS
40.0000 mg | ORAL_TABLET | Freq: Every day | ORAL | Status: DC
  Administered 2018-12-12 – 2018-12-17 (×5): 40 mg via ORAL
  Filled 2018-12-11 (×5): qty 2

## 2018-12-11 MED ORDER — VANCOMYCIN HCL 10 G IV SOLR
2500.0000 mg | Freq: Once | INTRAVENOUS | Status: AC
  Administered 2018-12-11: 2500 mg via INTRAVENOUS
  Filled 2018-12-11 (×2): qty 2500

## 2018-12-11 NOTE — Consult Note (Addendum)
PODIATRY / FOOT AND ANKLE SURGERY CONSULTATION NOTE  Reason for consult: L medial heel ulceration, possible osteomyelitis  Chief Complaint: L heel wound   HPI: [name] is a 53 y.o. female who presents with a complaint of left medial heel ulceration.  Patient states that she has had the ulceration for a long period of time.  She has home health treating her wound 3 times a week with dressing changes.  She has missed several appointments and is noncompliant with treatment regime.  She has noticed increasing foul odor coming from the ulceration with drainage and some periwound redness.  She would also like her nails trimmed today.  Patient has very little feeling to both feet and her last hemoglobin A1c was greater than 11%.  Patient was seen in the clinic today and noted to have worsening infection to the left heel so she was sent to the hospital for admission and further testing and possible surgical intervention.  Patient is to receive an MRI prior to surgical intervention.   PMHx:  Past Medical History:  Diagnosis Date  . Depression   . Diabetes mellitus without complication ([place])   . Hyperlipidemia   . Hypertension   . Neuromuscular disorder ([place])   . Schizo affective schizophrenia ([place])    abilify and pazil    Surgical Hx:  Past Surgical History:  Procedure Laterality Date  . CHOLECYSTECTOMY    . IRRIGATION AND DEBRIDEMENT FOOT Right 02/26/2016   Procedure: RIGHT 2ND TOE DEBRIDEMENT;  Surgeon: Samara Deist, DPM;  Location: [facility] ORS;  Service: Podiatry;  Laterality: Right;    FHx:  Family History  Problem Relation Age of Onset  . Breast cancer Cousin        maternal cousin  . Hypertension Mother   . Hyperthyroidism Mother   . Diabetes Maternal Grandmother   . Hypertension Maternal Grandmother   . Cancer Maternal Grandmother        unknown type  . Diabetes Maternal Grandfather   . Hypertension Maternal Grandfather   . Diabetes Paternal Grandmother   .  Hypertension Paternal Grandmother   . Diabetes Paternal Grandfather   . Hypertension Paternal Grandfather     Social History:  reports that she quit smoking about 9 years ago. Her smoking use included cigarettes. She has a 90.00 pack-year smoking history. She has quit using smokeless tobacco. She reports that she does not drink alcohol or use drugs.  Allergies: No Known Allergies  Review of Systems: General ROS: negative Respiratory ROS: no cough, shortness of breath, or wheezing Cardiovascular ROS: no chest pain or dyspnea on exertion Gastrointestinal ROS: no abdominal pain, change in bowel habits, or black or bloody stools Musculoskeletal ROS: positive for - joint pain Neurological ROS: positive for - numbness/tingling Dermatological ROS: positive for skin lesion changes and L heel medial ulceration  Medications Prior to Admission  Medication Sig Dispense Refill  . ARIPiprazole (ABILIFY) 10 MG tablet Take 10 mg by mouth daily.    [name] atorvastatin (LIPITOR) 40 MG tablet Take 40 mg by mouth daily.    [name] buPROPion (WELLBUTRIN XL) 150 MG 24 hr tablet Take 150 mg by mouth daily. Take in AM    . HUMALOG KWIKPEN 100 UNIT/ML KwikPen Inject 0.05 mLs (5 Units total) into the skin 3 (three) times daily with meals. + Sliding scale 15 mL 5  . Insulin Glargine (LANTUS SOLOSTAR) 100 UNIT/ML Solostar Pen Inject 50 Units into the skin daily. Adjust dose as advised (Patient taking differently: Inject 52  Units into the skin daily. ) 15 mL 1  . lisinopril (PRINIVIL,ZESTRIL) 20 MG tablet TAKE 1 TABLET(20 MG) BY MOUTH DAILY (Patient taking differently: Take 20 mg by mouth daily. ) 90 tablet 1  . metFORMIN (GLUCOPHAGE) 1000 MG tablet TAKE 1 TABLET(1000 MG) BY MOUTH TWICE DAILY WITH A MEAL 180 tablet 1  . acetaminophen (TYLENOL) 325 MG tablet Take 2 tablets (650 mg total) by mouth every 6 (six) hours as needed for mild pain (or Fever >/= 101). (Patient not taking: Reported on 10/26/2018)    .  amoxicillin-clavulanate (AUGMENTIN) 875-125 MG tablet Take 1 tablet by mouth 2 (two) times daily. For 10 day (Patient not taking: Reported on 12/11/2018) 20 tablet 0  . B-D UF III MINI PEN NEEDLES 31G X 5 MM MISC USE AS DIRECTED 100 each 3  . Blood Glucose Monitoring Suppl (ONETOUCH VERIO) w/Device KIT Use to check blood sugar up to 3 x daily 1 kit 0  . Lancets (ONETOUCH ULTRASOFT) lancets Use to check blood sugar up to 3 x daily 100 each 12  . ONETOUCH VERIO test strip Check blood sugar up to 3 x daily as needed 100 each 11  . PARoxetine (PAXIL) 20 MG tablet Take 1 tablet (20 mg total) by mouth daily. Prescribed by [facility] 30 tablet 0  . sulfamethoxazole-trimethoprim (BACTRIM DS) 800-160 MG tablet TK 1 T PO Q 12 H      Physical Exam: General: Alert and oriented.  No apparent distress. Vascular: DP/PT pulses intact bilaterally, CFT intact bilaterally. Neuro: Epicritic sensation diminished to bilateral lower extremities via light touch. Derm: Ulceration to the left medial plantar heel, measures 1.5 x 2 x 1.2 cm, seropurulent drainage noted from wound, wound appears fibronecrotic on examination with some areas of granular tissue and a large amount of hyperkeratotic tissue, notable gaseous odor noted, appears to probe close to bone or to bone through the wound.  No ascending cellulitis noted but periwound erythema present. MSK: Mild pain on palpation of the left heel.  5 out of 5 muscle strength to bilateral lower extremities.  Results for orders placed or performed during the hospital encounter of 12/11/18 (from the past 48 hour(s))  SARS Coronavirus 2 [facility] order, Performed in [facility] hospital lab)     Status: None   Collection Time: 12/11/18  2:35 PM  Result Value Ref Range   SARS Coronavirus 2 NEGATIVE NEGATIVE    Comment: (NOTE) If result is NEGATIVE SARS-CoV-2 target nucleic acids are NOT DETECTED. The SARS-CoV-2 RNA is generally detectable in upper and lower  respiratory  specimens during the acute phase of infection. The lowest  concentration of SARS-CoV-2 viral copies this assay can detect is 250  copies / mL. A negative result does not preclude SARS-CoV-2 infection  and should not be used as the sole basis for treatment or other  patient management decisions.  A negative result may occur with  improper specimen collection / handling, submission of specimen other  than nasopharyngeal swab, presence of viral mutation(s) within the  areas targeted by this assay, and inadequate number of viral copies  (<250 copies / mL). A negative result must be combined with clinical  observations, patient history, and epidemiological information. If result is POSITIVE SARS-CoV-2 target nucleic acids are DETECTED. The SARS-CoV-2 RNA is generally detectable in upper and lower  respiratory specimens dur ing the acute phase of infection.  Positive  results are indicative of active infection with SARS-CoV-2.  Clinical  correlation with patient history  and other diagnostic information is  necessary to determine patient infection status.  Positive results do  not rule out bacterial infection or co-infection with other viruses. If result is PRESUMPTIVE POSTIVE SARS-CoV-2 nucleic acids MAY BE PRESENT.   A presumptive positive result was obtained on the submitted specimen  and confirmed on repeat testing.  While 2019 novel coronavirus  (SARS-CoV-2) nucleic acids may be present in the submitted sample  additional confirmatory testing may be necessary for epidemiological  and / or clinical management purposes  to differentiate between  SARS-CoV-2 and other Sarbecovirus currently known to infect humans.  If clinically indicated additional testing with an alternate test  methodology [phone]) is advised. The SARS-CoV-2 RNA is generally  detectable in upper and lower respiratory sp ecimens during the acute  phase of infection. The expected result is Negative. Fact Sheet for  Patients:  StrictlyIdeas.no Fact Sheet for Healthcare Providers: BankingDealers.co.za This test is not yet approved or cleared by the [place] FDA and has been authorized for detection and/or diagnosis of SARS-CoV-2 by FDA under an Emergency Use Authorization (EUA).  This EUA will remain in effect (meaning this test can be used) for the duration of the COVID-19 declaration under Section 564(b)(1) of the Act, 21 U.S.C. section 360bbb-3(b)(1), unless the authorization is terminated or revoked sooner. Performed at [facility], [address] [place], [place] [address]   CBC WITH DIFFERENTIAL     Status: Abnormal   Collection Time: 12/11/18  4:04 PM  Result Value Ref Range   WBC 12.2 (H) 4.0 - 10.5 K/uL   RBC 4.02 3.87 - 5.11 MIL/uL   Hemoglobin 10.4 (L) 12.0 - 15.0 g/dL   HCT 32.4 (L) 36.0 - 46.0 %   MCV 80.6 80.0 - 100.0 fL   MCH 25.9 (L) 26.0 - 34.0 pg   MCHC 32.1 30.0 - 36.0 g/dL   RDW 16.0 (H) 11.5 - 15.5 %   Platelets 331 150 - 400 K/uL   nRBC 0.0 0.0 - 0.2 %   Neutrophils Relative % 66 %   Neutro Abs 8.2 (H) 1.7 - 7.7 K/uL   Lymphocytes Relative 23 %   Lymphs Abs 2.7 0.7 - 4.0 K/uL   Monocytes Relative 7 %   Monocytes Absolute 0.8 0.1 - 1.0 K/uL   Eosinophils Relative 2 %   Eosinophils Absolute 0.3 0.0 - 0.5 K/uL   Basophils Relative 1 %   Basophils Absolute 0.1 0.0 - 0.1 K/uL   Immature Granulocytes 1 %   Abs Immature Granulocytes 0.07 0.00 - 0.07 K/uL    Comment: Performed at [facility], [address] [place], [place] [address]  APTT     Status: Abnormal   Collection Time: 12/11/18  4:04 PM  Result Value Ref Range   aPTT 39 (H) 24 - 36 seconds    Comment:        IF BASELINE aPTT IS ELEVATED, SUGGEST PATIENT RISK ASSESSMENT BE USED TO DETERMINE APPROPRIATE ANTICOAGULANT THERAPY. Performed at [facility], [address] [place], [place] [address]   Protime-INR     Status: None    Collection Time: 12/11/18  4:04 PM  Result Value Ref Range   Prothrombin Time 12.7 11.4 - 15.2 seconds   INR 1.0 0.8 - 1.2    Comment: (NOTE) INR goal varies based on device and disease states. Performed at [facility], [address] [place], [place] [address]   Comprehensive metabolic panel     Status: Abnormal  Collection Time: 12/11/18  4:04 PM  Result Value Ref Range   Sodium 128 (L) 135 - 145 mmol/L   Potassium 5.1 3.5 - 5.1 mmol/L   Chloride 96 (L) 98 - 111 mmol/L   CO2 22 22 - 32 mmol/L   Glucose, Bld 105 (H) 70 - 99 mg/dL   BUN 17 6 - 20 mg/dL   Creatinine, Ser 1.02 (H) 0.44 - 1.00 mg/dL   Calcium 9.3 8.9 - 10.3 mg/dL   Total Protein 7.8 6.5 - 8.1 g/dL   Albumin 3.9 3.5 - 5.0 g/dL   AST 20 15 - 41 U/L   ALT 22 0 - 44 U/L   Alkaline Phosphatase 113 38 - 126 U/L   Total Bilirubin 0.2 (L) 0.3 - 1.2 mg/dL   GFR calc non Af Amer >60 >60 mL/min   GFR calc Af Amer >60 >60 mL/min   Anion gap 10 5 - 15    Comment: Performed at [facility], [address] [place], [place] [address]  Glucose, capillary     Status: Abnormal   Collection Time: 12/11/18  4:46 PM  Result Value Ref Range   Glucose-Capillary 107 (H) 70 - 99 mg/dL   Comment 1 Notify RN    No results found.  Blood pressure 120/74, pulse 84, temperature 98.1 F (36.7 C), temperature source Oral, resp. rate 17, height 5' 10.51" (1.791 m), weight 121.9 kg, SpO2 100 %.  Imaging: X-ray imaging of the left foot in clinic showed no changes consistent with osteomyelitis and no gas present.  Large spurring of the heel noted.  Assessment 1. Left medial heel ulceration with concern for osteomyelitis 2. Cellulitis left foot 3. Uncontrolled diabetes type 2 with polyneuropathy  Plan -Examined both feet. -X-ray imaging from clinic reviewed again.  No signs of osteomyelitis present or gas in the tissues, but clinical concern for osteomyelitis as the wound appears to probe to bone and there appears  to be a gaseous odor coming from the wound. -Appreciate order for MRI imaging of the left heel to look for osteomyelitis further or abscess. -Will await imaging prior to performing surgical intervention consisting of possible partial calcanectomy versus left heel wound debridement.  -Appreciate recommendations for antibiotic therapy. -We will see patient tomorrow and discuss possible surgical intervention for 8/12 or 8/13.  NPO midnight 12/12/2018 for possible procedure, will call and discuss after MRI.   Telephone call 12/12/18: reviewed MRI - no obvious osteomyelitis present but T2 with marrow edema at insertions of plantar fascia and achilles tendon/no T1 replacement seen, no abscess or fluid collection, wound appears deep to the periosteum level on imaging.  Discussed findings with pt.  Discussed all treatment options of both conservative and surgical attempts at correction including the risks and complications as well as benefits of the procedure.  At this time patient has elected for surgery consisting of left incision and drainage with wound and bone debridement and possible bone biopsy if exposed.  Patient understands and is agreeable to procedure.  Patient has been n.p.o. since midnight.  We will try to perform the procedure at some point in the afternoon on 12/12/2018.  Caroline More 12/11/2018, 4:57 PM

## 2018-12-11 NOTE — Progress Notes (Signed)
Pharmacy Antibiotic Note  [name] is a 53 y.o. female admitted on 12/11/2018. Patient was sent from podiatrist's office today for worsening ulcer on left foot. Pharmacy has been consulted for vancomycin and cefepime dosing.  Plan: Vancomycin 2500 mg IV x 1 loading dose followed by vancomycin 750 mg IV Q 12 hrs.  Goal AUC 400-550. Expected AUC: 403 SCr used: 1.02   Height: 5' 10.51" (179.1 cm) Weight: 268 lb 10.1 oz (121.9 kg) IBW/kg (Calculated) : 69.68  Temp (24hrs), Avg:98.1 F (36.7 C), Min:98.1 F (36.7 C), Max:98.1 F (36.7 C)  Recent Labs  Lab 12/11/18 1604  WBC 12.2*  CREATININE 1.02*    Estimated Creatinine Clearance: 90.2 mL/min (A) (by C-G formula based on SCr of 1.02 mg/dL (H)).    No Known Allergies  Antimicrobials this admission: Vancomycin 8/11 >>  Cefepime 8/11 >>   Dose adjustments this admission: NA  Microbiology results: 8/11 BCx: pending 8/11 UCx: pending?   Thank you for allowing pharmacy to be a part of this patient's care.  Tawnya Crook, PharmD Clinical Pharmacist 12/11/2018 4:54 PM

## 2018-12-11 NOTE — H&P (Signed)
[place] [facility] [place] NAME: [name]    MR#:  [id number]  DATE OF BIRTH:  11-10-1965  DATE OF ADMISSION:  12/11/2018  PRIMARY CARE PHYSICIAN: [name], DO   REQUESTING/REFERRING PHYSICIAN: Varney Baas MD  CHIEF COMPLAINT:  No chief complaint on file.   HISTORY OF PRESENT ILLNESS:  [name] is a 53 year old female with known history of multiple medical problems including uncontrolled diabetes mellitus, hyperlipidemia, hypertension, schizoaffective schizophrenia and depression presenting as a direct admit from podiatry clinic with left heel ulcer concerning for osteomyelitis.  She was recently discharged on 10/21/2018 following admission for infection of bilateral heel ulcers, she was treated with IV vancomycin and cefepime and discharged on oral antibiotics to follow-up with podiatrist. Patient state since discharge she has continued to have redness, drainage of pus, pain, and difficulty walking.  She denies fevers or chills.  Patient was seen by podiatrist today in the clinic who felt the left full ulcer was noted healing therefore requested patient to be admitted for possible MR foot, debridement and abx.  On arrival to the ED, she was afebrile with blood pressure 120/74 mm Hg and pulse rate 90 beats/min. There were no focal neurological deficits; he was alert and oriented x4. Left leg wound noted with drainage and pus.  Admission labs and imaging pending.  PAST MEDICAL HISTORY:   Past Medical History:  Diagnosis Date  . Depression   . Diabetes mellitus without complication ([place])   . Hyperlipidemia   . Hypertension   . Neuromuscular disorder ([place])   . Schizo affective schizophrenia ([place])    abilify and pazil    PAST SURGICAL HISTORY:   Past Surgical History:  Procedure Laterality Date  . CHOLECYSTECTOMY    . IRRIGATION AND DEBRIDEMENT FOOT Right 02/26/2016   Procedure: RIGHT 2ND TOE  DEBRIDEMENT;  Surgeon: Samara Deist, DPM;  Location: [facility] ORS;  Service: Podiatry;  Laterality: Right;    SOCIAL HISTORY:   Social History   Tobacco Use  . Smoking status: Former Smoker    Packs/day: 3.00    Years: 30.00    Pack years: 90.00    Types: Cigarettes    Quit date: 05/02/2009    Years since quitting: 9.6  . Smokeless tobacco: Former Systems developer  . Tobacco comment: Pt reported  quitting in 2011  Substance Use Topics  . Alcohol use: No    FAMILY HISTORY:   Family History  Problem Relation Age of Onset  . Breast cancer Cousin        maternal cousin  . Hypertension Mother   . Hyperthyroidism Mother   . Diabetes Maternal Grandmother   . Hypertension Maternal Grandmother   . Cancer Maternal Grandmother        unknown type  . Diabetes Maternal Grandfather   . Hypertension Maternal Grandfather   . Diabetes Paternal Grandmother   . Hypertension Paternal Grandmother   . Diabetes Paternal Grandfather   . Hypertension Paternal Grandfather     DRUG ALLERGIES:  No Known Allergies  REVIEW OF SYSTEMS:   Review of Systems  Constitutional: Negative for chills, fever, malaise/fatigue and weight loss.  HENT: Negative for congestion, hearing loss and sore throat.   Eyes: Negative for blurred vision and double vision.  Respiratory: Negative for cough, shortness of breath and wheezing.   Cardiovascular: Negative for chest pain, palpitations, orthopnea and leg swelling.  Gastrointestinal: Negative for abdominal pain, diarrhea, nausea and vomiting.  Genitourinary: Negative for  dysuria and urgency.  Musculoskeletal: Negative for myalgias.       Bilateral heel pain due to ulcers  Skin: Negative for rash.  Neurological: Negative for dizziness, sensory change, speech change, focal weakness and headaches.  Psychiatric/Behavioral: Positive for depression. The patient is nervous/anxious.     MEDICATIONS AT HOME:   Prior to Admission medications   Medication Sig Start Date End Date  Taking? Authorizing Provider  ARIPiprazole (ABILIFY) 10 MG tablet Take 10 mg by mouth daily.   Yes [provider]  atorvastatin (LIPITOR) 40 MG tablet Take 40 mg by mouth daily.   Yes [provider]  HUMALOG KWIKPEN 100 UNIT/ML KwikPen Inject 0.05 mLs (5 Units total) into the skin 3 (three) times daily with meals. + Sliding scale 03/23/18  Yes Karamalegos, Devonne Doughty, DO  Insulin Glargine (LANTUS SOLOSTAR) 100 UNIT/ML Solostar Pen Inject 50 Units into the skin daily. Adjust dose as advised Patient taking differently: Inject 52 Units into the skin daily.  12/27/17  Yes Karamalegos, Devonne Doughty, DO  lisinopril (PRINIVIL,ZESTRIL) 20 MG tablet TAKE 1 TABLET(20 MG) BY MOUTH DAILY Patient taking differently: Take 20 mg by mouth daily.  05/30/18  Yes Karamalegos, Devonne Doughty, DO  metFORMIN (GLUCOPHAGE) 1000 MG tablet TAKE 1 TABLET(1000 MG) BY MOUTH TWICE DAILY WITH A MEAL 06/25/18  Yes Karamalegos, Devonne Doughty, DO  acetaminophen (TYLENOL) 325 MG tablet Take 2 tablets (650 mg total) by mouth every 6 (six) hours as needed for mild pain (or Fever >/= 101). Patient not taking: Reported on 10/26/2018 12/16/17   Nicholes Mango, MD  amoxicillin-clavulanate (AUGMENTIN) 875-125 MG tablet Take 1 tablet by mouth 2 (two) times daily. For 10 day Patient not taking: Reported on 12/11/2018 11/27/18   [name], DO  B-D UF III MINI PEN NEEDLES 31G X 5 MM MISC USE AS DIRECTED 08/07/18   Parks Ranger, Devonne Doughty, DO  Blood Glucose Monitoring Suppl (ONETOUCH VERIO) w/Device KIT Use to check blood sugar up to 3 x daily 10/29/18   Parks Ranger, Devonne Doughty, DO  Lancets [facility] ULTRASOFT) lancets Use to check blood sugar up to 3 x daily 10/29/18   Parks Ranger, Devonne Doughty, DO  [facility] VERIO test strip Check blood sugar up to 3 x daily as needed 10/29/18   [name], DO  PARoxetine (PAXIL) 20 MG tablet Take 1 tablet (20 mg total) by mouth daily. Prescribed by [place], DO  sulfamethoxazole-trimethoprim (BACTRIM DS) 800-160 MG tablet TK 1 T PO Q 12 H 10/19/18   [provider]      VITAL SIGNS:  There were no vitals taken for this visit.  PHYSICAL EXAMINATION:   Physical Exam   GENERAL:  53 y.o.-year-old Caucasian patient lying in the bed with no acute distress.  EYES: Pupils equal, round, reactive to light and accommodation. No scleral icterus. Extraocular muscles intact.  HEENT: Head atraumatic, normocephalic. Oropharynx and nasopharynx clear.  NECK:  Supple, no jugular venous distention. No thyroid enlargement, no tenderness.  LUNGS: Normal breath sounds bilaterally, no wheezing, rales,rhonchi or crepitation. No use of accessory muscles of respiration.  CARDIOVASCULAR: Regular rate and rhythm, S1, S2 normal. No murmurs, rubs, or gallops.  ABDOMEN: Soft, nondistended, nontender. Bowel sounds present. No organomegaly or mass.  EXTREMITIES: No pedal edema, cyanosis, or clubbing.  NEUROLOGIC: Cranial nerves II through XII are intact. Muscle strength 5/5 in all extremities. Sensation intact. Gait not checked.  PSYCHIATRIC: The patient is alert and oriented x 3.  Normal affect and good eye contact. SKIN: Left heel erythema with induration with warmth and tenderness and ulceration with yellowish base as well as bilateral heel ulcers on bilateral heel calluses. Some drainage noted. Wound re-wrapped with ace wrap   DATA REVIEWED:  LABORATORY PANEL:   CBC No results for input(s): WBC, HGB, HCT, PLT in the last 168 hours. ------------------------------------------------------------------------------------------------------------------  Chemistries  No results for input(s): NA, K, CL, CO2, GLUCOSE, BUN, CREATININE, CALCIUM, MG, AST, ALT, ALKPHOS, BILITOT in the last 168 hours.  Invalid input(s): GFRCGP ------------------------------------------------------------------------------------------------------------------  Cardiac Enzymes  No results for input(s): TROPONINI in the last 168 hours. ------------------------------------------------------------------------------------------------------------------  RADIOLOGY:  No results found.  EKG:  EKG: normal EKG, normal sinus rhythm, unchanged from previous tracings.  IMPRESSION AND PLAN:   53 y.o. female  53 year old female with known history of multiple medical problems including uncontrolled diabetes mellitus, hyperlipidemia, hypertension, schizoaffective schizophrenia and depression presenting as a direct admit from podiatry clinic with left heel ulcer concerning for osteomyelitis.  1. Left heel ulcers -concerns for osteomyelitis in a patient with history of uncontrolled diabetes mellitus, recently discharged on 10/20/2018 with similar presentation and treated with cefepime and vancomycin. - Admit to MedSurg unit - Check CBC, CMP - Blood cultures - We will obtain MRI of left foot - Start empiric with vancomycin and cefepime - Wound care consult - Podiatry consult -discussed with Dr. Luana Shu who requested admission and will see patient  2. Uncontrolled diabetes mellitus type 2 - Recent HgbA1c 12.9. Goal < 7.0 - Hold metformin - Continue Lantus at half dose - SSI - DM education and close PCP follow up  3. HTN  + Goal BP <130/80 -Continue lisinopril pending lab values  4. HLD  + Goal LDL<100 - Atorvastatin 30m PO qhs  5. Schizophrenia and depression - Continue Abilify and Paxil  6. DVT prophylaxis - Hold anti-coagulation pending procedure    All the records are reviewed and case discussed with ED provider. Management plans discussed with the patient, family and they are in agreement.  CODE STATUS: FULL  TOTAL TIME TAKING CARE OF THIS PATIENT: 50 minutes.    on 12/11/2018 at 2:57 PM  ERufina Falco DNP, FNP-BC [facility] Nurse Practitioner Between 7am to 6pm - Pager [phone] After 6pm go to [email or web address] - password [email or web address]  Office  [phone] CC: Primary care physician; KOlin Hauser DO

## 2018-12-11 NOTE — Chronic Care Management (AMB) (Signed)
  Chronic Care Management   Follow Up Note   12/11/2018 Name: [name] MRN: [id number] DOB: 1965-10-31  Referred by: [name], DO Reason for referral : Care Coordination   [name] is a 53 y.o. year old female who is a primary care patient of [name], DO. The CCM team was consulted for assistance with chronic disease management and care coordination needs.  [name] has a past medical history including but not limited to type 2 diabetes, hypertension, hyperlipidemia and recent hospitalization for left lower extremity cellulitis with bilateral heel infection, sepsis (10/2018).   Perform chart review. Note that [name] is currently admitted at [facility], admitted directly from podiatry clinic with left heel ulcer concerning for osteomyelitis.  Collaborate with CM Nurse Case Manager and CM Social Worker   Plan  Will follow for patient's discharge to schedule next outreach call.  Harlow Asa, PharmD, [place] [employer] [phone]

## 2018-12-11 NOTE — Progress Notes (Signed)
Family Meeting Note  Advance Directive:yes  Today a meeting took place with the Patient.  The following clinical team members were present during this meeting:MD  The following were discussed:Patient's diagnosis: Foot ulcers with suspected osteomyelitis, uncontrolled diabetes, hypertension, hyperlipidemia, Patient's progosis: Unable to determine and Goals for treatment: Full Code  Additional follow-up to be provided: Podiatry.  Time spent during discussion:20 minutes  Gabriela Basta, MD

## 2018-12-12 LAB — BASIC METABOLIC PANEL
Anion gap: 7 (ref 5–15)
BUN: 17 mg/dL (ref 6–20)
CO2: 23 mmol/L (ref 22–32)
Calcium: 9.1 mg/dL (ref 8.9–10.3)
Chloride: 98 mmol/L (ref 98–111)
Creatinine, Ser: 1.02 mg/dL — ABNORMAL HIGH (ref 0.44–1.00)
GFR calc Af Amer: >60 mL/min (ref >60)
GFR calc non Af Amer: >60 mL/min (ref >60)
Glucose, Bld: 157 mg/dL — ABNORMAL HIGH (ref 70–99)
Potassium: 5 mmol/L (ref 3.5–5.1)
Sodium: 128 mmol/L — ABNORMAL LOW (ref 135–145)

## 2018-12-12 LAB — CBC
HCT: 31.5 % — ABNORMAL LOW (ref 36.0–46.0)
Hemoglobin: 10 g/dL — ABNORMAL LOW (ref 12.0–15.0)
MCH: 26 pg (ref 26.0–34.0)
MCHC: 31.7 g/dL (ref 30.0–36.0)
MCV: 81.8 fL (ref 80.0–100.0)
Platelets: 304 10*3/uL (ref 150–400)
RBC: 3.85 MIL/uL — ABNORMAL LOW (ref 3.87–5.11)
RDW: 16.2 % — ABNORMAL HIGH (ref 11.5–15.5)
WBC: 10.9 10*3/uL — ABNORMAL HIGH (ref 4.0–10.5)
nRBC: 0 % (ref 0.0–0.2)

## 2018-12-12 LAB — SEDIMENTATION RATE: Sed Rate: 70 mm/hr — ABNORMAL HIGH (ref 0–30)

## 2018-12-12 LAB — GLUCOSE, CAPILLARY
Glucose-Capillary: 163 mg/dL — ABNORMAL HIGH (ref 70–99)
Glucose-Capillary: 141 mg/dL — ABNORMAL HIGH (ref 70–99)
Glucose-Capillary: 124 mg/dL — ABNORMAL HIGH (ref 70–99)
Glucose-Capillary: 206 mg/dL — ABNORMAL HIGH (ref 70–99)

## 2018-12-12 LAB — URINE CULTURE: Culture: <10000 — AB

## 2018-12-12 LAB — C-REACTIVE PROTEIN: CRP: 1 mg/dL — ABNORMAL HIGH (ref <1.0)

## 2018-12-12 MED ORDER — SODIUM CHLORIDE 0.9 % IV SOLN
INTRAVENOUS | Status: AC
  Administered 2018-12-12 (×2): via INTRAVENOUS

## 2018-12-12 MED ORDER — POVIDONE-IODINE 10 % EX SWAB: 2.0000 "application " | Freq: Once | CUTANEOUS | Status: DC

## 2018-12-12 NOTE — Progress Notes (Signed)
Pt's sister has called multiple times [name] ) has called multiple times regarding questions with pt's surgery scheduled for today. Dr. Luana Shu informed pt sister has requested to talk with him regarding surgery. Dr.Baker has contacted sister as requested. Pt is alert and oriented and verbalizes understanding of surgical procedure planned.

## 2018-12-12 NOTE — Progress Notes (Signed)
PODIATRY / FOOT AND ANKLE SURGERY PROGRESS NOTE  Reason for consult: L medial heel ulceration, possible osteomyelitis  Chief Complaint: L heel wound   HPI: [name] is a 53 y.o. female who presents resting in bed with a left medial heel ulceration.  Patient states she has minimal pain to the left heel at this time.  Patient has maintained partial to nonweightbearing on the left foot since admission.  Patient is still awaiting surgical intervention at this time.  Patient's case has been delayed due to urgent/emergent procedures being performed in main operating room.  Patient denies nausea, vomiting, fever, and chills.  PMHx:  Past Medical History:  Diagnosis Date  . Depression   . Diabetes mellitus without complication ([place])   . Hyperlipidemia   . Hypertension   . Neuromuscular disorder ([place])   . Schizo affective schizophrenia ([place])    abilify and pazil    Surgical Hx:  Past Surgical History:  Procedure Laterality Date  . CHOLECYSTECTOMY    . IRRIGATION AND DEBRIDEMENT FOOT Right 02/26/2016   Procedure: RIGHT 2ND TOE DEBRIDEMENT;  Surgeon: Samara Deist, DPM;  Location: [facility] ORS;  Service: Podiatry;  Laterality: Right;    FHx:  Family History  Problem Relation Age of Onset  . Breast cancer Cousin        maternal cousin  . Hypertension Mother   . Hyperthyroidism Mother   . Diabetes Maternal Grandmother   . Hypertension Maternal Grandmother   . Cancer Maternal Grandmother        unknown type  . Diabetes Maternal Grandfather   . Hypertension Maternal Grandfather   . Diabetes Paternal Grandmother   . Hypertension Paternal Grandmother   . Diabetes Paternal Grandfather   . Hypertension Paternal Grandfather     Social History:  reports that she quit smoking about 9 years ago. Her smoking use included cigarettes. She has a 90.00 pack-year smoking history. She has quit using smokeless tobacco. She reports that she does not drink alcohol or use drugs.  Allergies: No  Known Allergies  Review of Systems: General ROS: negative Respiratory ROS: no cough, shortness of breath, or wheezing Cardiovascular ROS: no chest pain or dyspnea on exertion Gastrointestinal ROS: no abdominal pain, change in bowel habits, or black or bloody stools Musculoskeletal ROS: positive for - joint pain Neurological ROS: positive for - numbness/tingling Dermatological ROS: positive for skin lesion changes and L heel medial ulceration  Medications Prior to Admission  Medication Sig Dispense Refill  . ARIPiprazole (ABILIFY) 10 MG tablet Take 10 mg by mouth daily.    [name] atorvastatin (LIPITOR) 40 MG tablet Take 40 mg by mouth daily.    [name] buPROPion (WELLBUTRIN XL) 150 MG 24 hr tablet Take 150 mg by mouth daily. Take in AM    . HUMALOG KWIKPEN 100 UNIT/ML KwikPen Inject 0.05 mLs (5 Units total) into the skin 3 (three) times daily with meals. + Sliding scale 15 mL 5  . Insulin Glargine (LANTUS SOLOSTAR) 100 UNIT/ML Solostar Pen Inject 50 Units into the skin daily. Adjust dose as advised (Patient taking differently: Inject 52 Units into the skin daily. ) 15 mL 1  . lisinopril (PRINIVIL,ZESTRIL) 20 MG tablet TAKE 1 TABLET(20 MG) BY MOUTH DAILY (Patient taking differently: Take 20 mg by mouth daily. ) 90 tablet 1  . metFORMIN (GLUCOPHAGE) 1000 MG tablet TAKE 1 TABLET(1000 MG) BY MOUTH TWICE DAILY WITH A MEAL 180 tablet 1  . acetaminophen (TYLENOL) 325 MG tablet Take 2 tablets (650 mg total)  by mouth every 6 (six) hours as needed for mild pain (or Fever >/= 101). (Patient not taking: Reported on 10/26/2018)    . amoxicillin-clavulanate (AUGMENTIN) 875-125 MG tablet Take 1 tablet by mouth 2 (two) times daily. For 10 day (Patient not taking: Reported on 12/11/2018) 20 tablet 0  . B-D UF III MINI PEN NEEDLES 31G X 5 MM MISC USE AS DIRECTED 100 each 3  . Blood Glucose Monitoring Suppl (ONETOUCH VERIO) w/Device KIT Use to check blood sugar up to 3 x daily 1 kit 0  . Lancets (ONETOUCH ULTRASOFT) lancets  Use to check blood sugar up to 3 x daily 100 each 12  . ONETOUCH VERIO test strip Check blood sugar up to 3 x daily as needed 100 each 11  . PARoxetine (PAXIL) 20 MG tablet Take 1 tablet (20 mg total) by mouth daily. Prescribed by [facility] 30 tablet 0    Physical Exam: General: Alert and oriented.  No apparent distress.  Vascular: DP/PT pulses intact bilaterally, CFT intact bilaterally.  Neuro: Epicritic sensation diminished to bilateral lower extremities via light touch.  Derm: Ulceration to the left medial plantar heel, measures 1.5 x 2 x 1.2 cm, seropurulent drainage noted from wound, wound appears fibronecrotic on examination with some areas of granular tissue and a large amount of hyperkeratotic tissue, decreased gaseous odor noted since yesterday, appears to probe close to bone or to bone through the wound.  No ascending cellulitis noted but periwound erythema present.  MSK: Mild pain on palpation of the left heel.  5 out of 5 muscle strength to bilateral lower extremities.  Results for orders placed or performed during the hospital encounter of 12/11/18 (from the past 48 hour(s))  SARS Coronavirus 2 [facility] order, Performed in [facility] hospital lab)     Status: None   Collection Time: 12/11/18  2:35 PM  Result Value Ref Range   SARS Coronavirus 2 NEGATIVE NEGATIVE    Comment: (NOTE) If result is NEGATIVE SARS-CoV-2 target nucleic acids are NOT DETECTED. The SARS-CoV-2 RNA is generally detectable in upper and lower  respiratory specimens during the acute phase of infection. The lowest  concentration of SARS-CoV-2 viral copies this assay can detect is 250  copies / mL. A negative result does not preclude SARS-CoV-2 infection  and should not be used as the sole basis for treatment or other  patient management decisions.  A negative result may occur with  improper specimen collection / handling, submission of specimen other  than nasopharyngeal swab, presence of viral  mutation(s) within the  areas targeted by this assay, and inadequate number of viral copies  (<250 copies / mL). A negative result must be combined with clinical  observations, patient history, and epidemiological information. If result is POSITIVE SARS-CoV-2 target nucleic acids are DETECTED. The SARS-CoV-2 RNA is generally detectable in upper and lower  respiratory specimens dur ing the acute phase of infection.  Positive  results are indicative of active infection with SARS-CoV-2.  Clinical  correlation with patient history and other diagnostic information is  necessary to determine patient infection status.  Positive results do  not rule out bacterial infection or co-infection with other viruses. If result is PRESUMPTIVE POSTIVE SARS-CoV-2 nucleic acids MAY BE PRESENT.   A presumptive positive result was obtained on the submitted specimen  and confirmed on repeat testing.  While 2019 novel coronavirus  (SARS-CoV-2) nucleic acids may be present in the submitted sample  additional confirmatory testing may be necessary for epidemiological  and / or clinical management purposes  to differentiate between  SARS-CoV-2 and other Sarbecovirus currently known to infect humans.  If clinically indicated additional testing with an alternate test  methodology [phone]) is advised. The SARS-CoV-2 RNA is generally  detectable in upper and lower respiratory sp ecimens during the acute  phase of infection. The expected result is Negative. Fact Sheet for Patients:  StrictlyIdeas.no Fact Sheet for Healthcare Providers: BankingDealers.co.za This test is not yet approved or cleared by the [place] FDA and has been authorized for detection and/or diagnosis of SARS-CoV-2 by FDA under an Emergency Use Authorization (EUA).  This EUA will remain in effect (meaning this test can be used) for the duration of the COVID-19 declaration under Section 564(b)(1)  of the Act, 21 U.S.C. section 360bbb-3(b)(1), unless the authorization is terminated or revoked sooner. Performed at [facility], [address] [place], [place] [address]   CBC WITH DIFFERENTIAL     Status: Abnormal   Collection Time: 12/11/18  4:04 PM  Result Value Ref Range   WBC 12.2 (H) 4.0 - 10.5 K/uL   RBC 4.02 3.87 - 5.11 MIL/uL   Hemoglobin 10.4 (L) 12.0 - 15.0 g/dL   HCT 32.4 (L) 36.0 - 46.0 %   MCV 80.6 80.0 - 100.0 fL   MCH 25.9 (L) 26.0 - 34.0 pg   MCHC 32.1 30.0 - 36.0 g/dL   RDW 16.0 (H) 11.5 - 15.5 %   Platelets 331 150 - 400 K/uL   nRBC 0.0 0.0 - 0.2 %   Neutrophils Relative % 66 %   Neutro Abs 8.2 (H) 1.7 - 7.7 K/uL   Lymphocytes Relative 23 %   Lymphs Abs 2.7 0.7 - 4.0 K/uL   Monocytes Relative 7 %   Monocytes Absolute 0.8 0.1 - 1.0 K/uL   Eosinophils Relative 2 %   Eosinophils Absolute 0.3 0.0 - 0.5 K/uL   Basophils Relative 1 %   Basophils Absolute 0.1 0.0 - 0.1 K/uL   Immature Granulocytes 1 %   Abs Immature Granulocytes 0.07 0.00 - 0.07 K/uL    Comment: Performed at [facility], [address] [place], [place] [address]  APTT     Status: Abnormal   Collection Time: 12/11/18  4:04 PM  Result Value Ref Range   aPTT 39 (H) 24 - 36 seconds    Comment:        IF BASELINE aPTT IS ELEVATED, SUGGEST PATIENT RISK ASSESSMENT BE USED TO DETERMINE APPROPRIATE ANTICOAGULANT THERAPY. Performed at [facility], [address] [place], [place] [address]   Protime-INR     Status: None   Collection Time: 12/11/18  4:04 PM  Result Value Ref Range   Prothrombin Time 12.7 11.4 - 15.2 seconds   INR 1.0 0.8 - 1.2    Comment: (NOTE) INR goal varies based on device and disease states. Performed at [facility], [address] [place], [place] [address]   CULTURE, BLOOD (ROUTINE X 2) w Reflex to ID Panel     Status: None (Preliminary result)   Collection Time: 12/11/18  4:04 PM   Specimen: BLOOD  Result Value Ref Range    Specimen Description BLOOD LEFT ANTECUBITAL    Special Requests      BOTTLES DRAWN AEROBIC AND ANAEROBIC Blood Culture results may not be optimal due to an excessive volume of blood received in culture bottles   Culture      NO GROWTH < 24 HOURS Performed at [facility], [address] [place], [place] [address]    Report Status PENDING   Comprehensive metabolic panel     Status: Abnormal   Collection Time: 12/11/18  4:04 PM  Result Value Ref Range   Sodium 128 (L) 135 - 145 mmol/L   Potassium 5.1 3.5 - 5.1 mmol/L   Chloride 96 (L) 98 - 111 mmol/L   CO2 22 22 - 32 mmol/L   Glucose, Bld 105 (H) 70 - 99 mg/dL   BUN 17 6 - 20 mg/dL   Creatinine, Ser 1.02 (H) 0.44 - 1.00 mg/dL   Calcium 9.3 8.9 - 10.3 mg/dL   Total Protein 7.8 6.5 - 8.1 g/dL   Albumin 3.9 3.5 - 5.0 g/dL   AST 20 15 - 41 U/L   ALT 22 0 - 44 U/L   Alkaline Phosphatase 113 38 - 126 U/L   Total Bilirubin 0.2 (L) 0.3 - 1.2 mg/dL   GFR calc non Af Amer >60 >60 mL/min   GFR calc Af Amer >60 >60 mL/min   Anion gap 10 5 - 15    Comment: Performed at [facility], [address] [place], [place] [address]  CULTURE, BLOOD (ROUTINE X 2) w Reflex to ID Panel     Status: None (Preliminary result)   Collection Time: 12/11/18  4:15 PM   Specimen: BLOOD  Result Value Ref Range   Specimen Description BLOOD BLOOD RIGHT HAND    Special Requests      BOTTLES DRAWN AEROBIC AND ANAEROBIC Blood Culture adequate volume   Culture      NO GROWTH < 24 HOURS Performed at [facility], [address] [place], [place] [address]    Report Status PENDING   Glucose, capillary     Status: Abnormal   Collection Time: 12/11/18  4:46 PM  Result Value Ref Range   Glucose-Capillary 107 (H) 70 - 99 mg/dL   Comment 1 Notify RN   MRSA PCR Screening     Status: None   Collection Time: 12/11/18  8:58 PM   Specimen: Nasal Mucosa; Nasopharyngeal  Result Value Ref Range   MRSA by PCR NEGATIVE NEGATIVE    Comment:         The GeneXpert MRSA Assay (FDA approved for NASAL specimens only), is one component of a comprehensive MRSA colonization surveillance program. It is not intended to diagnose MRSA infection nor to guide or monitor treatment for MRSA infections. Performed at [facility], [address] [place], [place] [address]   Glucose, capillary     Status: Abnormal   Collection Time: 12/11/18  9:00 PM  Result Value Ref Range   Glucose-Capillary 174 (H) 70 - 99 mg/dL  Basic metabolic panel     Status: Abnormal   Collection Time: 12/12/18  5:24 AM  Result Value Ref Range   Sodium 128 (L) 135 - 145 mmol/L   Potassium 5.0 3.5 - 5.1 mmol/L   Chloride 98 98 - 111 mmol/L   CO2 23 22 - 32 mmol/L   Glucose, Bld 157 (H) 70 - 99 mg/dL   BUN 17 6 - 20 mg/dL   Creatinine, Ser 1.02 (H) 0.44 - 1.00 mg/dL   Calcium 9.1 8.9 - 10.3 mg/dL   GFR calc non Af Amer >60 >60 mL/min   GFR calc Af Amer >60 >60 mL/min   Anion gap 7 5 - 15    Comment: Performed at [facility], [address] [place], [place]  [address]  CBC     Status: Abnormal   Collection Time: 12/12/18  5:24 AM  Result Value Ref Range   WBC 10.9 (H) 4.0 - 10.5 K/uL   RBC 3.85 (L) 3.87 - 5.11 MIL/uL   Hemoglobin 10.0 (L) 12.0 - 15.0 g/dL   HCT 31.5 (L) 36.0 - 46.0 %   MCV 81.8 80.0 - 100.0 fL   MCH 26.0 26.0 - 34.0 pg   MCHC 31.7 30.0 - 36.0 g/dL   RDW 16.2 (H) 11.5 - 15.5 %   Platelets 304 150 - 400 K/uL   nRBC 0.0 0.0 - 0.2 %    Comment: Performed at [facility], [address] [place], [place] [address]  Sedimentation rate     Status: Abnormal   Collection Time: 12/12/18  5:24 AM  Result Value Ref Range   Sed Rate 70 (H) 0 - 30 mm/hr    Comment: Performed at [facility], [address] [place], [place] [address]  C-reactive protein     Status: Abnormal   Collection Time: 12/12/18  5:24 AM  Result Value Ref Range   CRP 1.0 (H) <1.0 mg/dL    Comment: Performed at [facility], [place], [place], [place] [address]  Glucose, capillary     Status: Abnormal   Collection Time: 12/12/18  8:01 AM  Result Value Ref Range   Glucose-Capillary 163 (H) 70 - 99 mg/dL   Comment 1 Notify RN   Glucose, capillary     Status: Abnormal   Collection Time: 12/12/18 11:51 AM  Result Value Ref Range   Glucose-Capillary 141 (H) 70 - 99 mg/dL   Comment 1 Notify RN    Mr Foot Left Wo Contrast  Result Date: 12/12/2018 CLINICAL DATA:  Open diabetic foot wound. EXAM: MRI OF THE LEFT FOOT WITHOUT CONTRAST TECHNIQUE: Multiplanar, multisequence MR imaging of the left foot was performed. No intravenous contrast was administered. COMPARISON:  Radiographs 12/01/2018 FINDINGS: There is a open wound involving the medial aspect of the calcaneus along the plantar surface. Associated severe diffuse cellulitis and diffuse myofasciitis mainly involving the short flexor muscles. I do not see a discrete fluid collection to suggest a drainable abscess and I do not see any obvious changes of pyomyositis without contrast. There is T2 signal abnormality involving the calcaneus along the posterior aspect which is certainly suspicious for early osteomyelitis. There is also edema like signal change near the Achilles tendon attachment which is likely stress related. There is also significant insertional calcification/tug lesion. The tibiotalar and subtalar joints are maintained. Small joint effusions. Sinus tarsi appears normal. The medial and lateral ankle ligaments and tendons are intact. The Achilles tendon is thickened and demonstrates increased T2 signal intensity suggesting chronic tendinopathy and probable small interstitial tears. IMPRESSION: 1. Open wound on the plantar and medial aspect of the hindfoot with associated significant diffuse cellulitis and myositis. No definite discrete drainable soft tissue abscess or pyomyositis. 2. T2 signal abnormality in the posterior and inferior calcaneus is suspicious for  early osteomyelitis. 3. No findings for septic arthritis. 4. 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 Electronically Signed   By: Marijo Sanes M.D.   On: 12/12/2018 08:02    Blood pressure (!) 159/91, pulse (!) 107, temperature 98.4 F (36.9 C), temperature source Oral, resp. rate 18, height 5' 10.51" (1.791 m), weight 121.9 kg, SpO2 95 %.  Imaging: X-ray imaging of the left foot in clinic showed no changes consistent with  osteomyelitis and no gas present.  Large spurring of the heel noted.  Assessment 1. Left medial heel ulceration with possible osteomyelitis of calcaneus 2. Cellulitis left foot 3. Uncontrolled diabetes type 2 with polyneuropathy  Plan -Examined both feet. -X-ray imaging from clinic reviewed again.  No signs of osteomyelitis present or gas in the tissues -MRI reviewed - T2 enhancement of the posterior and plantar heel likely from chronic achilles tendonitis/plantar fasciits with associated large spur formations, no T1 fat replacement seen to my eye.  Radiologist suggests possible early acute osteomyelitis of periosteum of the heel. -Discussed all treatment options of both conservative and surgical attempts at correction including the risks and complications as well as benefits of the procedure.  At this time patient has elected for surgery consisting of left incision and drainage with wound and bone debridement and bone biopsy. -Appreciate recommendations for antibiotic therapy.  Culture taken, WBC 10.9, afebrile, ESR 70, CRP 1. -Discussed how case was delayed today due to an urgent case that bumped hers.  Pt understands.  Planned surgery for 1200 on 12/13/2018.  NPO order placed at midnight. -Continue dressing changes. -PT/OT ordered, NWB L heel and minimal contact for transfers with surgical shoe.  Prevalon boots ordered.  Patient understands and is agreeable to procedure.  Patient has been n.p.o. since midnight.  We will try to perform the  procedure at some point in the afternoon on 12/12/2018.  Caroline More 12/12/2018, 4:38 PM

## 2018-12-12 NOTE — Progress Notes (Signed)
Spoke with daughter [name] [phone] ) on telephone. Update given. Daughter aware pt is scheduled for surgery this afternoon and verbalizes understanding of plan of care. Pt and daughter request that information be given to them only regarding pt. Request by pt and daughter  that sister be removed as contact person. Password has been provided.

## 2018-12-12 NOTE — TOC Initial Note (Signed)
Transition of Care [facility]) - Initial/Assessment Note    Patient Details  Name: NISA DECAIRE MRN: [id number] Date of Birth: Sep 20, 1965  Transition of Care [facility] CM/SW Contact:    Su Hilt, RN Phone Number: 12/12/2018, 12:42 PM  Clinical Narrative:                  I spoke with the daughter [name].  She is in the process of getting legal guardianship but it has not gone thru, there is no legal guardian in place.  The patient lives at home with her mother that has dementia, the patient's siblings have taken over care of the elderly mother and have left the care of the patient to [name] the daughter and the son.  [name] is going to speak to her brother today and call the patient and discuss the plan for DC.  I provided her with my number and she will call me back this afternoon.        Patient Goals and CMS Choice        Expected Discharge Plan and Services                                                Prior Living Arrangements/Services                       Activities of Daily Living Home Assistive Devices/Equipment: None ADL Screening (condition at time of admission) Patient's cognitive ability adequate to safely complete daily activities?: Yes Is the patient deaf or have difficulty hearing?: No Does the patient have difficulty seeing, even when wearing glasses/contacts?: No Does the patient have difficulty concentrating, remembering, or making decisions?: No Patient able to express need for assistance with ADLs?: Yes Does the patient have difficulty dressing or bathing?: No Independently performs ADLs?: Yes (appropriate for developmental age) Does the patient have difficulty walking or climbing stairs?: Yes Weakness of Legs: Left Weakness of Arms/Hands: None  Permission Sought/Granted                  Emotional Assessment              Admission diagnosis:  Lt Heel Ulcer  Patient Active Problem List   Diagnosis  Date Noted  . Osteomyelitis ([place]) 12/11/2018  . Left leg cellulitis 10/18/2018  . Pyelonephritis 12/14/2017  . Morbid obesity ([place]) 05/10/2017  . Long-term use of high-risk medication 05/10/2017  . Hyperlipidemia associated with type 2 diabetes mellitus ([place]) 06/27/2016  . Hammer toe of second toe of right foot 04/20/2016  . Essential hypertension 06/29/2015  . Schizoaffective disorder ([place]) 06/29/2015  . DM (diabetes mellitus), type 2, 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[place]) 35/00/9381  . Polyneuropathy 04/02/2014   PCP:  [name], DO Pharmacy:   [facility] [place], [place] [address] [place] [address] Phone: [phone] Fax: [place], [place] [address] [place] [address] Phone: [phone] Fax: [phone]     Social Determinants of Health ([place]) Interventions    Readmission Risk Interventions Readmission Risk Prevention Plan 10/21/2018 10/18/2018 12/16/2017  Post Dischage Appt Complete - -  Medication Screening Complete - -  Transportation Screening Complete Complete Complete  PCP or  Specialist Appt within 5-7 Days - - Complete  Home Care Screening - Complete Complete  Medication Review (RN CM) - Complete Complete  Some recent data might be hidden

## 2018-12-12 NOTE — Progress Notes (Signed)
[facility] NAME: [name]    MR#:  [id number]  DATE OF BIRTH:  [birth year]  SUBJECTIVE:  CHIEF COMPLAINT:  No chief complaint on file.  Pain of the foot is well controlled.  Surgery later today.  REVIEW OF SYSTEMS:    Review of Systems  Constitutional: Negative for chills and fever.  HENT: Negative for sore throat.   Eyes: Negative for blurred vision, double vision and pain.  Respiratory: Negative for cough, hemoptysis, shortness of breath and wheezing.   Cardiovascular: Negative for chest pain, palpitations, orthopnea and leg swelling.  Gastrointestinal: Negative for abdominal pain, constipation, diarrhea, heartburn, nausea and vomiting.  Genitourinary: Negative for dysuria and hematuria.  Musculoskeletal: Negative for back pain and joint pain.  Skin: Negative for rash.  Neurological: Negative for sensory change, speech change, focal weakness and headaches.  Endo/Heme/Allergies: Does not bruise/bleed easily.  Psychiatric/Behavioral: Negative for depression. The patient is not nervous/anxious.     DRUG ALLERGIES:  No Known Allergies  VITALS:  Blood pressure 120/88, pulse (!) 105, temperature 98 F (36.7 C), temperature source Oral, resp. rate 18, height 5' 10.51" (1.791 m), weight 121.9 kg, SpO2 98 %.  PHYSICAL EXAMINATION:   Physical Exam  GENERAL:  53 y.o.-year-old patient lying in the bed with no acute distress.  EYES: Pupils equal, round, reactive to light and accommodation. No scleral icterus. Extraocular muscles intact.  HEENT: Head atraumatic, normocephalic. Oropharynx and nasopharynx clear.  NECK:  Supple, no jugular venous distention. No thyroid enlargement, no tenderness.  LUNGS: Normal breath sounds bilaterally, no wheezing, rales, rhonchi. No use of accessory muscles of respiration.  CARDIOVASCULAR: S1, S2 normal. No murmurs, rubs, or gallops.  ABDOMEN: Soft, nontender, nondistended. Bowel sounds  present. No organomegaly or mass.  EXTREMITIES: No cyanosis, clubbing or edema b/l.    NEUROLOGIC: Cranial nerves II through XII are intact. No focal Motor or sensory deficits b/l.   PSYCHIATRIC: The patient is alert and oriented x 3.  SKIN: No obvious rash, lesion, or ulcer.  Dressing on her foot wound  LABORATORY PANEL:   CBC Recent Labs  Lab 12/12/18 0524  WBC 10.9*  HGB 10.0*  HCT 31.5*  PLT 304   ------------------------------------------------------------------------------------------------------------------ Chemistries  Recent Labs  Lab 12/11/18 1604 12/12/18 0524  NA 128* 128*  K 5.1 5.0  CL 96* 98  CO2 22 23  GLUCOSE 105* 157*  BUN 17 17  CREATININE 1.02* 1.02*  CALCIUM 9.3 9.1  AST 20  --   ALT 22  --   ALKPHOS 113  --   BILITOT 0.2*  --    ------------------------------------------------------------------------------------------------------------------  Cardiac Enzymes No results for input(s): TROPONINI in the last 168 hours. ------------------------------------------------------------------------------------------------------------------  RADIOLOGY:  Mr Foot Left Wo Contrast  Result Date: 12/12/2018 CLINICAL DATA:  Open diabetic foot wound. EXAM: MRI OF THE LEFT FOOT WITHOUT CONTRAST TECHNIQUE: Multiplanar, multisequence MR imaging of the left foot was performed. No intravenous contrast was administered. COMPARISON:  Radiographs 12/01/2018 FINDINGS: There is a open wound involving the medial aspect of the calcaneus along the plantar surface. Associated severe diffuse cellulitis and diffuse myofasciitis mainly involving the short flexor muscles. I do not see a discrete fluid collection to suggest a drainable abscess and I do not see any obvious changes of pyomyositis without contrast. There is T2 signal abnormality involving the calcaneus along the posterior aspect which is certainly suspicious for early osteomyelitis. There is also edema like signal change  near the Achilles  tendon attachment which is likely stress related. There is also significant insertional calcification/tug lesion. The tibiotalar and subtalar joints are maintained. Small joint effusions. Sinus tarsi appears normal. The medial and lateral ankle ligaments and tendons are intact. The Achilles tendon is thickened and demonstrates increased T2 signal intensity suggesting chronic tendinopathy and probable small interstitial tears. IMPRESSION: 1. Open wound on the plantar and medial aspect of the hindfoot with associated significant diffuse cellulitis and myositis. No definite discrete drainable soft tissue abscess or pyomyositis. 2. T2 signal abnormality in the posterior and inferior calcaneus is suspicious for early osteomyelitis. 3. No findings for septic arthritis. 4. 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 Electronically Signed   By: Marijo Sanes M.D.   On: 12/12/2018 08:02     ASSESSMENT AND PLAN:   53 y.o. female  53 year old female with known history of multiple medical problems including uncontrolled diabetes mellitus, hyperlipidemia, hypertension, schizoaffective schizophrenia and depression presenting as a direct admit from podiatry clinic with left heel ulcer concerning for osteomyelitis.  1. Left heel ulcers Osteomyelitis of the calcaneus On broad-spectrum IV antibiotics. Waiting for podiatry input and surgical procedure Intraoperative cultures will be followed Oral or IV antibiotics as outpatient depending on operative findings and results. Pain medications as needed  2. Uncontrolled diabetes mellitus type 2 - Recent HgbA1c  Continue Lantus, sliding scale insulin.  Diabetic diet  3. HTN  -Continue lisinopril   4.   Hyperlipidemia - Atorvastatin 40mg  PO qhs  5. Schizophrenia and depression - Continue Abilify and Paxil  6. DVT prophylaxis - Hold anti-coagulation pending procedure  All the records are reviewed and case  discussed with Care Management/Social Worker Management plans discussed with the patient, family and they are in agreement.  CODE STATUS: FULL CODE  TOTAL TIME TAKING CARE OF THIS PATIENT: 35 minutes.   POSSIBLE D/C IN 1-2 DAYS, DEPENDING ON CLINICAL CONDITION.  Neita Carp M.D on 12/12/2018 at 12:23 PM  Between 7am to 6pm - Pager - [phone]  After 6pm go to [email or web address] - password [email or web address] [facility]  Office  [phone]  CC: Primary care physician; [name], DO  Note: This dictation was prepared with Dragon dictation along with smaller phrase technology. Any transcriptional errors that result from this process are unintentional.

## 2018-12-13 ENCOUNTER — Inpatient Hospital Stay: Payer: Medicare Other | Admitting: Anesthesiology

## 2018-12-13 ENCOUNTER — Encounter: Payer: Self-pay | Admitting: Anesthesiology

## 2018-12-13 ENCOUNTER — Inpatient Hospital Stay: Payer: Medicare Other

## 2018-12-13 ENCOUNTER — Encounter
Admission: AD | Disposition: A | Discharge status: Skilled Nursing Facility | Payer: Self-pay | Source: Home / Self Care | Attending: Internal Medicine

## 2018-12-13 HISTORY — PX: IRRIGATION AND DEBRIDEMENT FOOT: SHX6602

## 2018-12-13 LAB — BASIC METABOLIC PANEL
Anion gap: 7 (ref 5–15)
BUN: 15 mg/dL (ref 6–20)
CO2: 22 mmol/L (ref 22–32)
Calcium: 8.9 mg/dL (ref 8.9–10.3)
Chloride: 104 mmol/L (ref 98–111)
Creatinine, Ser: 1.02 mg/dL — ABNORMAL HIGH (ref 0.44–1.00)
GFR calc Af Amer: >60 mL/min (ref >60)
GFR calc non Af Amer: >60 mL/min (ref >60)
Glucose, Bld: 156 mg/dL — ABNORMAL HIGH (ref 70–99)
Potassium: 4.8 mmol/L (ref 3.5–5.1)
Sodium: 133 mmol/L — ABNORMAL LOW (ref 135–145)

## 2018-12-13 LAB — GLUCOSE, CAPILLARY
Glucose-Capillary: 171 mg/dL — ABNORMAL HIGH (ref 70–99)
Glucose-Capillary: 141 mg/dL — ABNORMAL HIGH (ref 70–99)
Glucose-Capillary: 149 mg/dL — ABNORMAL HIGH (ref 70–99)
Glucose-Capillary: 209 mg/dL — ABNORMAL HIGH (ref 70–99)
Glucose-Capillary: 177 mg/dL — ABNORMAL HIGH (ref 70–99)

## 2018-12-13 SURGERY — IRRIGATION AND DEBRIDEMENT FOOT
Anesthesia: General | Site: Foot | Laterality: Left

## 2018-12-13 MED ORDER — FENTANYL CITRATE (PF) 100 MCG/2ML IJ SOLN
INTRAMUSCULAR | Status: AC
  Filled 2018-12-13: qty 2

## 2018-12-13 MED ORDER — VANCOMYCIN HCL 1000 MG IV SOLR
INTRAVENOUS | Status: DC | PRN
  Administered 2018-12-13: 1000 mg

## 2018-12-13 MED ORDER — ONDANSETRON HCL 4 MG/2ML IJ SOLN: 4.0000 mg | Freq: Once | INTRAMUSCULAR | Status: DC | PRN

## 2018-12-13 MED ORDER — FENTANYL CITRATE (PF) 100 MCG/2ML IJ SOLN
INTRAMUSCULAR | Status: DC | PRN
  Administered 2018-12-13: 50 ug via INTRAVENOUS

## 2018-12-13 MED ORDER — LIDOCAINE HCL (PF) 1 % IJ SOLN
INTRAMUSCULAR | Status: AC
  Filled 2018-12-13: qty 30

## 2018-12-13 MED ORDER — PROPOFOL 500 MG/50ML IV EMUL
INTRAVENOUS | Status: AC
  Filled 2018-12-13: qty 50

## 2018-12-13 MED ORDER — LIDOCAINE HCL (PF) 2 % IJ SOLN
INTRAMUSCULAR | Status: AC
  Filled 2018-12-13: qty 10

## 2018-12-13 MED ORDER — MIDAZOLAM HCL 2 MG/2ML IJ SOLN
INTRAMUSCULAR | Status: AC
  Filled 2018-12-13: qty 2

## 2018-12-13 MED ORDER — PROPOFOL 10 MG/ML IV BOLUS
INTRAVENOUS | Status: DC | PRN
  Administered 2018-12-13: 30 mg via INTRAVENOUS

## 2018-12-13 MED ORDER — PHENYLEPHRINE HCL (PRESSORS) 10 MG/ML IV SOLN
INTRAVENOUS | Status: DC | PRN
  Administered 2018-12-13 (×3): 100 ug via INTRAVENOUS

## 2018-12-13 MED ORDER — FENTANYL CITRATE (PF) 100 MCG/2ML IJ SOLN: 25.0000 ug | INTRAMUSCULAR | Status: DC | PRN

## 2018-12-13 MED ORDER — PROPOFOL 500 MG/50ML IV EMUL
INTRAVENOUS | Status: DC | PRN
  Administered 2018-12-13: 75 ug/kg/min via INTRAVENOUS

## 2018-12-13 MED ORDER — MIDAZOLAM HCL 2 MG/2ML IJ SOLN
INTRAMUSCULAR | Status: DC | PRN
  Administered 2018-12-13: 1 mg via INTRAVENOUS

## 2018-12-13 MED ORDER — SODIUM CHLORIDE 0.9 % IV SOLN
INTRAVENOUS | Status: DC | PRN
  Administered 2018-12-13: 12:00:00 via INTRAVENOUS

## 2018-12-13 MED ORDER — LIDOCAINE HCL 1 % IJ SOLN
INTRAMUSCULAR | Status: DC | PRN
  Administered 2018-12-13 (×2): 10 mL

## 2018-12-13 MED ORDER — VANCOMYCIN HCL 1000 MG IV SOLR
INTRAVENOUS | Status: AC
  Filled 2018-12-13: qty 1000

## 2018-12-13 SURGICAL SUPPLY — 65 items
BLADE OSC/SAGITTAL MD 5.5X18 (BLADE) IMPLANT
BLADE OSCILLATING/SAGITTAL (BLADE)
BLADE SW THK.38XMED LNG THN (BLADE) IMPLANT
BNDG COHESIVE 4X5 TAN STRL (GAUZE/BANDAGES/DRESSINGS) ×3 IMPLANT
BNDG COHESIVE 6X5 TAN STRL LF (GAUZE/BANDAGES/DRESSINGS) ×3 IMPLANT
BNDG CONFORM 2 STRL LF (GAUZE/BANDAGES/DRESSINGS) ×3 IMPLANT
BNDG CONFORM 3 STRL LF (GAUZE/BANDAGES/DRESSINGS) ×3 IMPLANT
BNDG ELASTIC 4X5.8 VLCR STR LF (GAUZE/BANDAGES/DRESSINGS) ×3 IMPLANT
BNDG ESMARK 4X12 TAN STRL LF (GAUZE/BANDAGES/DRESSINGS) ×3 IMPLANT
BNDG GAUZE 4.5X4.1 6PLY STRL (MISCELLANEOUS) ×3 IMPLANT
BNDG STRETCH 4X75 STRL LF (GAUZE/BANDAGES/DRESSINGS) ×2 IMPLANT
CANISTER SUCT 1200ML W/VALVE (MISCELLANEOUS) ×3 IMPLANT
CANISTER SUCT 3000ML PPV (MISCELLANEOUS) ×3 IMPLANT
COVER WAND RF STERILE (DRAPES) ×3 IMPLANT
CUFF TOURN SGL QUICK 12 (TOURNIQUET CUFF) IMPLANT
CUFF TOURN SGL QUICK 18X4 (TOURNIQUET CUFF) IMPLANT
DRAPE FLUOR MINI C-ARM 54X84 (DRAPES) IMPLANT
DRAPE XRAY CASSETTE 23X24 (DRAPES) IMPLANT
DRESSING ALLEVYN 4X4 (MISCELLANEOUS) IMPLANT
DURAPREP 26ML APPLICATOR (WOUND CARE) ×3 IMPLANT
ELECT REM PT RETURN 9FT ADLT (ELECTROSURGICAL) ×3
ELECTRODE REM PT RTRN 9FT ADLT (ELECTROSURGICAL) ×1 IMPLANT
GAUZE PACKING 1/4 X5 YD (GAUZE/BANDAGES/DRESSINGS) ×3 IMPLANT
GAUZE PACKING IODOFORM 1X5 (MISCELLANEOUS) ×3 IMPLANT
GAUZE SPONGE 4X4 12PLY STRL (GAUZE/BANDAGES/DRESSINGS) ×3 IMPLANT
GAUZE SPONGE 4X4 16PLY XRAY LF (GAUZE/BANDAGES/DRESSINGS) ×2 IMPLANT
GAUZE XEROFORM 1X8 LF (GAUZE/BANDAGES/DRESSINGS) ×3 IMPLANT
GLOVE BIO SURGEON STRL SZ7.5 (GLOVE) ×3 IMPLANT
GLOVE INDICATOR 8.0 STRL GRN (GLOVE) ×3 IMPLANT
GOWN STRL REUS W/ TWL LRG LVL3 (GOWN DISPOSABLE) ×2 IMPLANT
GOWN STRL REUS W/TWL LRG LVL3 (GOWN DISPOSABLE) ×4
GOWN STRL REUS W/TWL MED LVL3 (GOWN DISPOSABLE) ×6 IMPLANT
HANDPIECE VERSAJET DEBRIDEMENT (MISCELLANEOUS) IMPLANT
IV NS 1000ML (IV SOLUTION) ×2
IV NS 1000ML BAXH (IV SOLUTION) ×1 IMPLANT
KIT STIMULAN RAPID CURE 5CC (Orthopedic Implant) ×2 IMPLANT
KIT TURNOVER KIT A (KITS) ×3 IMPLANT
LABEL OR SOLS (LABEL) ×3 IMPLANT
NDL FILTER BLUNT 18X1 1/2 (NEEDLE) ×1 IMPLANT
NDL HYPO 25X1 1.5 SAFETY (NEEDLE) ×1 IMPLANT
NEEDLE FILTER BLUNT 18X 1/2SAF (NEEDLE) ×2
NEEDLE FILTER BLUNT 18X1 1/2 (NEEDLE) ×1 IMPLANT
NEEDLE HYPO 25X1 1.5 SAFETY (NEEDLE) ×3 IMPLANT
NS IRRIG 500ML POUR BTL (IV SOLUTION) ×3 IMPLANT
PACK EXTREMITY ARMC (MISCELLANEOUS) ×3 IMPLANT
PAD ABD DERMACEA PRESS 5X9 (GAUZE/BANDAGES/DRESSINGS) ×3 IMPLANT
PULSAVAC PLUS IRRIG FAN TIP (DISPOSABLE) ×3
RASP SM TEAR CROSS CUT (RASP) IMPLANT
SHIELD FULL FACE ANTIFOG 7M (MISCELLANEOUS) ×3 IMPLANT
SOL .9 NS 3000ML IRR  AL (IV SOLUTION) ×2
SOL .9 NS 3000ML IRR UROMATIC (IV SOLUTION) ×1 IMPLANT
SOL PREP PVP 2OZ (MISCELLANEOUS) ×3
SOLUTION PREP PVP 2OZ (MISCELLANEOUS) ×1 IMPLANT
STOCKINETTE IMPERVIOUS 9X36 MD (GAUZE/BANDAGES/DRESSINGS) ×3 IMPLANT
SUT ETHILON 2 0 FS 18 (SUTURE) ×6 IMPLANT
SUT ETHILON 4-0 (SUTURE) ×2
SUT ETHILON 4-0 FS2 18XMFL BLK (SUTURE) ×1
SUT VIC AB 3-0 SH 27 (SUTURE) ×2
SUT VIC AB 3-0 SH 27X BRD (SUTURE) ×1 IMPLANT
SUT VIC AB 4-0 FS2 27 (SUTURE) ×3 IMPLANT
SUTURE ETHLN 4-0 FS2 18XMF BLK (SUTURE) ×1 IMPLANT
SWAB CULTURE AMIES ANAERIB BLU (MISCELLANEOUS) IMPLANT
SYR 10ML LL (SYRINGE) ×6 IMPLANT
SYR 3ML LL SCALE MARK (SYRINGE) ×3 IMPLANT
TIP FAN IRRIG PULSAVAC PLUS (DISPOSABLE) ×1 IMPLANT

## 2018-12-13 NOTE — Anesthesia Postprocedure Evaluation (Signed)
Anesthesia Post Note  Patient: [name]  Procedure(s) Performed: IRRIGATION AND DEBRIDEMENT FOOT (Left Foot)  Patient location during evaluation: PACU Anesthesia Type: General Level of consciousness: awake and alert Pain management: pain level controlled Vital Signs Assessment: post-procedure vital signs reviewed and stable Respiratory status: spontaneous breathing, nonlabored ventilation, respiratory function stable and patient connected to nasal cannula oxygen Cardiovascular status: blood pressure returned to baseline and stable Postop Assessment: no apparent nausea or vomiting Anesthetic complications: no     Last Vitals:  Vitals:   12/13/18 1330 12/13/18 1635  BP: 136/84 (!) 149/89  Pulse: 92 94  Resp: 19 16  Temp:  36.4 C  SpO2: 96% 97%    Last Pain:  Vitals:   12/13/18 1328  TempSrc:   PainSc: 0-No pain                 Olga Bourbeau S

## 2018-12-13 NOTE — Anesthesia Procedure Notes (Signed)
Date/Time: 12/13/2018 12:05 PM Performed by: Johnna Acosta, CRNA Pre-anesthesia Checklist: Patient identified, Emergency Drugs available, Suction available, Patient being monitored and Timeout performed Patient Re-evaluated:Patient Re-evaluated prior to induction Oxygen Delivery Method: Simple face mask Preoxygenation: Pre-oxygenation with 100% oxygen Induction Type: IV induction

## 2018-12-13 NOTE — Progress Notes (Signed)
PT Cancellation Note  Patient Details Name: [name] MRN: [id number] DOB: 13-Nov-1965   Cancelled Treatment:    Reason Eval/Treat Not Completed: Other (comment); Per chart review pt has surgery planned this date at 12:00.  Will attempt PT eval s/p surgery as medically appropriate.     Linus Salmons PT, DPT 12/13/18, 9:06 AM

## 2018-12-13 NOTE — TOC Progression Note (Signed)
Transition of Care [facility]) - Progression Note    Patient Details  Name: FOYE HAGGART MRN: [id number] Date of Birth: [birth year]  Transition of Care [facility]) CM/SW Contact  Brinnley Lacap, Lenice Llamas Phone Number: [phone]  12/13/2018, 2:50 PM  Clinical Narrative: Clinical Social Worker ([place]) received a call from patient's daughter [name] asking about D/C plan. CSW explained that PT will evaluate patient likely tomorrow and make a recommendation of home health or SNF. Per daughter patient may need SNF because she has been immobile. CSW explained that [facility] will have to approve SNF. Daughter verbalized her understanding and is agreeable to SNF search in [place]. FL2 complete and faxed out. CSW will continue to follow and assist as needed.           Expected Discharge Plan and Services                                                 Social Determinants of Health (SDOH) Interventions    Readmission Risk Interventions Readmission Risk Prevention Plan 10/21/2018 10/18/2018 12/16/2017  Post Dischage Appt Complete - -  Medication Screening Complete - -  Transportation Screening Complete Complete Complete  PCP or Specialist Appt within 5-7 Days - - Complete  Home Care Screening - Complete Complete  Medication Review (RN CM) - Complete Complete  Some recent data might be hidden

## 2018-12-13 NOTE — Op Note (Addendum)
PODIATRY / FOOT AND ANKLE SURGERY OPERATIVE REPORT    SURGEON: Caroline More, DPM  PRE-OPERATIVE DIAGNOSIS:  1.  Osteomyelitis left calcaneus, acute 2.  Cellulitis left lower extremity 3.  Diabetes type 2 with polyneuropathy  POST-OPERATIVE DIAGNOSIS: Same  PROCEDURE(S): 1. Left medial heel ulceration wound debridement to the level of bone, 100% excisional. 2.  Application of antibiotic beads with vancomycin 3. Bone Biopsy left calcaneus  HEMOSTASIS: No tourniquet  ANESTHESIA: MAC  ESTIMATED BLOOD LOSS: 10 cc  FINDING(S): 1.  Left medial heel ulceration with fibronecrotic changes.  After debridement area appeared to be healthy and viable with healthy bleeding granulation tissue.  Calcaneus appeared to be hard with no signs of osteomyelitis clinically.  PATHOLOGY/SPECIMEN(S): Wound culture left heel wound, bone culture left calcaneus  INDICATIONS:   [name] is a 53 y.o. female who presents with a nonhealing ulceration to the left medial heel.  Patient was seen in clinic a few days prior to the procedure and noted to have worsening changes to the ulceration.  X-ray imaging was taken showing no signs of osteomyelitis.  Patient did have subsequent cellulitic changes and was sent to the hospital for admission and MRI imaging due to the depth of wound being very close to the bone.  MRI imaging showed possible acute osteomyelitis of the left calcaneus.  Patient has subsequently been consented for left medial heel wound debridement with bone debridement and bone biopsy and possible use of antibiotic beads.  DESCRIPTION: After obtaining full informed written consent, the patient was brought back to the operating room and placed supine upon the operating table.  The patient received IV antibiotics prior to induction.  After obtaining adequate anesthesia 10 cc of 1% lidocaine plain were injected about the operative site at the left heel, the patient was prepped and draped in the  standard fashion.  Attention was then directed to the left medial heel at the area the ulceration which measured 3.5 cm x 1.2 cm x 1.1 cm.  A wound culture was taken and passed off in the operative site.  A small incision was made to extend the ulceration slightly at the medial and lateral aspects of the wound.  15 blade was used to debride all fibrin necrotic tissue in the area and freshen up the margins to healthy bleeding tissue.  The calcaneus did appear to be exposed through the wound so further debridement was performed with a curette and rongure.  At this time the pulse lavage was used to flush the wound with 2 L normal sterile saline.  A sterile clean rongeur was then used to take a bone culture which was passed off the operative site and sent off for culture.  The bone appeared to be very hard and had no signs of visible osteomyelitis clinically.  Antibiotic beads mixed with vancomycin powder were then made and placed within the wound site.  The tissue was then slightly rearranged to close the ulceration with 2-0 nylon leaving a small area open for further drainage.  10 additional cc of 1% lidocaine plain was injected about the operative site and a postoperative dressing was then applied consisting of Xeroform followed by 4 x 4 gauze ABD, Kerlix, and Ace wrap.  The patient tolerated the procedure and anesthesia well was transferred to recovery room fossae and stable vascular status intact to all toes of both feet following.  Postoperative monitoring the patient will be discharged back to the inpatient room with the following written and oral postop  instructions: Keep surgical dressings clean dry and intact, nonweightbearing to left lower extremity all times, take postop pain medication antibiotics as prescribed to prescribed, and patient will continue to be followed up with until discharge.  COMPLICATIONS: None  CONDITION: Good, stable  Caroline More

## 2018-12-13 NOTE — Progress Notes (Signed)
OT Cancellation Note  Patient Details Name: [name] MRN: [id number] DOB: February 18, 1966   Cancelled Treatment:    Reason Eval/Treat Not Completed: Patient at procedure or test/ unavailable. Consult received, chart reviewed. Pt being taken out of room by transport staff. Per chart, scheduled for surgery today. Will hold and re-attempt at later date/time as pt is medically appropriate. Of note, if pt goes under general anesthesia for the surgery, therapy will require new orders to see.   [name], MPH, MS, OTR/L ascom [phone] 12/13/18, 4:46 PM

## 2018-12-13 NOTE — Anesthesia Post-op Follow-up Note (Signed)
Anesthesia QCDR form completed.        

## 2018-12-13 NOTE — H&P (Signed)
HISTORY AND PHYSICAL INTERVAL NOTE:  12/13/2018  10:44 AM  [name]  has presented today for surgery, with the diagnosis of L heel wound and calcaneal osteomyelitis.  The various methods of treatment have been discussed with the patient.  No guarantees were given.  After consideration of risks, benefits and other options for treatment, the patient has consented to surgery.  I have reviewed the patients' chart and labs.     A history and physical examination was performed in my office.  The patient was reexamined.  There have been no changes to this history and physical examination.  Caroline More

## 2018-12-13 NOTE — NC FL2 (Signed)
[place] LEVEL OF CARE SCREENING TOOL     IDENTIFICATION  Patient Name: [name] Birthdate: 1965/09/14 Sex: female Admission Date (Current Location): 12/11/2018  [place] and [place] Number:  [name] [id number] [place] and Address:  [facility], [address], [place], [place] [address]      Provider Number: [id number]  Attending Physician Name and Address:  Gladstone Lighter, MD  Relative Name and Phone Number:       Current Level of Care: Hospital Recommended Level of Care: [place] Prior Approval Number:    Date Approved/Denied:   PASRR Number:    Discharge Plan: SNF    Current Diagnoses: Patient Active Problem List   Diagnosis Date Noted  . Osteomyelitis ([place]) 12/11/2018  . Left leg cellulitis 10/18/2018  . Pyelonephritis 12/14/2017  . Morbid obesity ([place]) 05/10/2017  . Long-term use of high-risk medication 05/10/2017  . Hyperlipidemia associated with type 2 diabetes mellitus ([place]) 06/27/2016  . Hammer toe of second toe of right foot 04/20/2016  . Essential hypertension 06/29/2015  . Schizoaffective disorder ([place]) 06/29/2015  . DM (diabetes mellitus), type 2, 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[place]) 96/78/9381  . Polyneuropathy 04/02/2014    Orientation RESPIRATION BLADDER Height & Weight     Self, Time, Situation, Place  Normal Continent Weight: 268 lb 10.1 oz (121.9 kg) Height:  5' 10.51" (179.1 cm)  BEHAVIORAL SYMPTOMS/MOOD NEUROLOGICAL BOWEL NUTRITION STATUS      Continent Diet(Diet: Heart Healthy/ Carb Modified.)  AMBULATORY STATUS COMMUNICATION OF NEEDS Skin   Extensive Assist Verbally Surgical wounds(Incision: Left Heel.)                       Personal Care Assistance Level of Assistance  Bathing, Feeding, Dressing Bathing Assistance: Limited assistance Feeding assistance: Independent Dressing Assistance: Limited assistance     Functional Limitations Info  Sight,  Hearing, Speech Sight Info: Adequate Hearing Info: Adequate Speech Info: Adequate    SPECIAL CARE FACTORS FREQUENCY  PT (By licensed PT), OT (By licensed OT)     PT Frequency: 5 OT Frequency: 5            Contractures      Additional Factors Info  Code Status, Allergies Code Status Info: Full Code. Allergies Info: No Known Allergies.           Current Medications (12/13/2018):  This is the current hospital active medication list Current Facility-Administered Medications  Medication Dose Route Frequency Provider Last Rate Last Dose  . ARIPiprazole (ABILIFY) tablet 10 mg  10 mg Oral QHS Caroline More, DPM   10 mg at 12/12/18 2117  . atorvastatin (LIPITOR) tablet 40 mg  40 mg Oral Daily Caroline More, DPM   Stopped at 12/13/18 [phone]  . buPROPion (WELLBUTRIN XL) 24 hr tablet 150 mg  150 mg Oral Daily Caroline More, DPM   Stopped at 12/13/18 [phone]  . ceFEPIme (MAXIPIME) 2 g in sodium chloride 0.9 % 100 mL IVPB  2 g Intravenous Q8H Caroline More, DPM   Stopped at 12/13/18 1000  . insulin aspart (novoLOG) injection 0-15 Units  0-15 Units Subcutaneous TID WC Caroline More, DPM   3 Units at 12/13/18 0859  . insulin aspart (novoLOG) injection 0-5 Units  0-5 Units Subcutaneous QHS Caroline More, DPM   2 Units at 12/12/18 2121  . insulin glargine (LANTUS) injection 25 Units  25 Units Subcutaneous Daily Caroline More, DPM   Stopped at 12/13/18 0901  . lisinopril (ZESTRIL) tablet 20  mg  20 mg Oral Daily Caroline More, DPM   Stopped at 12/13/18 0901  . PARoxetine (PAXIL) tablet 20 mg  20 mg Oral Daily Caroline More, DPM   20 mg at 12/12/18 2115  . vancomycin (VANCOCIN) IVPB 750 mg/150 ml premix  750 mg Intravenous Q12H Caroline More, DPM   Stopped at 12/13/18 2840     Discharge Medications: Please see discharge summary for a list of discharge medications.  Relevant Imaging Results:  Relevant Lab Results:   Additional Information SSN: [id number]  Sheryl Saintil, Veronia Beets, LCSW

## 2018-12-13 NOTE — Transfer of Care (Signed)
Immediate Anesthesia Transfer of Care Note  Patient: [name]  Procedure(s) Performed: IRRIGATION AND DEBRIDEMENT FOOT (Left Foot)  Patient Location: PACU  Anesthesia Type:General  Level of Consciousness: awake, alert  and oriented  Airway & Oxygen Therapy: Patient Spontanous Breathing and Patient connected to face mask oxygen  Post-op Assessment: Report given to RN and Post -op Vital signs reviewed and stable  Post vital signs: Reviewed and stable  Last Vitals:  Vitals Value Taken Time  BP 121/80 12/13/18 1302  Temp 36 C 12/13/18 1302  Pulse 95 12/13/18 1302  Resp 18 12/13/18 1302  SpO2 100 % 12/13/18 1302  Vitals shown include unvalidated device data.  Last Pain:  Vitals:   12/13/18 1300  TempSrc:   PainSc: 0-No pain      Patients Stated Pain Goal: 2 (62/37/62 8315)  Complications: No apparent anesthesia complications

## 2018-12-13 NOTE — Anesthesia Preprocedure Evaluation (Addendum)
Anesthesia Evaluation  Patient identified by MRN, date of birth, ID band Patient awake    Reviewed: Allergy & Precautions, NPO status , Patient's Chart, lab work & pertinent test results, reviewed documented beta blocker date and time   Airway Mallampati: II  TM Distance: >3 FB     Dental  (+) Chipped   Pulmonary former smoker,           Cardiovascular hypertension, Pt. on medications      Neuro/Psych PSYCHIATRIC DISORDERS Depression Schizophrenia  Neuromuscular disease    GI/Hepatic   Endo/Other  diabetes, Type 2  Renal/GU      Musculoskeletal   Abdominal   Peds  Hematology   Anesthesia Other Findings Obese.   Reproductive/Obstetrics                             Anesthesia Physical Anesthesia Plan  ASA: III  Anesthesia Plan: General   Post-op Pain Management:    Induction: Intravenous  PONV Risk Score and Plan:   Airway Management Planned:   Additional Equipment:   Intra-op Plan:   Post-operative Plan:   Informed Consent: I have reviewed the patients History and Physical, chart, labs and discussed the procedure including the risks, benefits and alternatives for the proposed anesthesia with the patient or authorized representative who has indicated his/her understanding and acceptance.       Plan Discussed with: CRNA  Anesthesia Plan Comments:        Anesthesia Quick Evaluation

## 2018-12-13 NOTE — Progress Notes (Signed)
[name] NAME: [name]    MR#:  [id number]  DATE OF BIRTH:  03-Jan-1966  SUBJECTIVE:  CHIEF COMPLAINT:  No chief complaint on file.  -Left median heel ulceration, osteomyelitis on MRI.  Going for surgery today.  REVIEW OF SYSTEMS:  Review of Systems  Constitutional: Positive for malaise/fatigue. Negative for chills and fever.  HENT: Negative for congestion, ear discharge, hearing loss and nosebleeds.   Eyes: Negative for blurred vision and double vision.  Respiratory: Negative for cough, shortness of breath and wheezing.   Cardiovascular: Negative for chest pain and palpitations.  Gastrointestinal: Negative for abdominal pain, constipation, diarrhea, nausea and vomiting.  Genitourinary: Negative for dysuria.  Musculoskeletal: Positive for joint pain and myalgias.  Neurological: Negative for dizziness, focal weakness, seizures, weakness and headaches.  Psychiatric/Behavioral: Negative for depression.    DRUG ALLERGIES:  No Known Allergies  VITALS:  Blood pressure 136/84, pulse 92, temperature (!) 97.1 F (36.2 C), resp. rate 19, height 5' 10.51" (1.791 m), weight 121.9 kg, SpO2 96 %.  PHYSICAL EXAMINATION:  Physical Exam   GENERAL:  53 y.o.-year-old obese patient lying in the bed with no acute distress.  EYES: Pupils equal, round, reactive to light and accommodation. No scleral icterus. Extraocular muscles intact.  HEENT: Head atraumatic, normocephalic. Oropharynx and nasopharynx clear.  NECK:  Supple, no jugular venous distention. No thyroid enlargement, no tenderness.  LUNGS: Normal breath sounds bilaterally, no wheezing, rales,rhonchi or crepitation. No use of accessory muscles of respiration.  Decreased bibasilar breath sounds CARDIOVASCULAR: S1, S2 normal. No murmurs, rubs, or gallops.  ABDOMEN: Soft, nontender, nondistended. Bowel sounds present. No organomegaly or mass.  EXTREMITIES: No pedal edema,  cyanosis, or clubbing.  Left heel in dressing. NEUROLOGIC: Cranial nerves II through XII are intact. Muscle strength 5/5 in all extremities. Sensation intact. Gait not checked.  PSYCHIATRIC: The patient is alert and oriented x 3.  SKIN: No obvious rash, lesion, or ulcer.    LABORATORY PANEL:   CBC Recent Labs  Lab 12/12/18 0524  WBC 10.9*  HGB 10.0*  HCT 31.5*  PLT 304   ------------------------------------------------------------------------------------------------------------------  Chemistries  Recent Labs  Lab 12/11/18 1604  12/13/18 0353  NA 128*   < > 133*  K 5.1   < > 4.8  CL 96*   < > 104  CO2 22   < > 22  GLUCOSE 105*   < > 156*  BUN 17   < > 15  CREATININE 1.02*   < > 1.02*  CALCIUM 9.3   < > 8.9  AST 20  --   --   ALT 22  --   --   ALKPHOS 113  --   --   BILITOT 0.2*  --   --    < > = values in this interval not displayed.   ------------------------------------------------------------------------------------------------------------------  Cardiac Enzymes No results for input(s): TROPONINI in the last 168 hours. ------------------------------------------------------------------------------------------------------------------  RADIOLOGY:  Mr Foot Left Wo Contrast  Result Date: 12/12/2018 CLINICAL DATA:  Open diabetic foot wound. EXAM: MRI OF THE LEFT FOOT WITHOUT CONTRAST TECHNIQUE: Multiplanar, multisequence MR imaging of the left foot was performed. No intravenous contrast was administered. COMPARISON:  Radiographs 12/01/2018 FINDINGS: There is a open wound involving the medial aspect of the calcaneus along the plantar surface. Associated severe diffuse cellulitis and diffuse myofasciitis mainly involving the short flexor muscles. I do not see a discrete fluid collection to suggest a drainable abscess  and I do not see any obvious changes of pyomyositis without contrast. There is T2 signal abnormality involving the calcaneus along the posterior aspect which is  certainly suspicious for early osteomyelitis. There is also edema like signal change near the Achilles tendon attachment which is likely stress related. There is also significant insertional calcification/tug lesion. The tibiotalar and subtalar joints are maintained. Small joint effusions. Sinus tarsi appears normal. The medial and lateral ankle ligaments and tendons are intact. The Achilles tendon is thickened and demonstrates increased T2 signal intensity suggesting chronic tendinopathy and probable small interstitial tears. IMPRESSION: 1. Open wound on the plantar and medial aspect of the hindfoot with associated significant diffuse cellulitis and myositis. No definite discrete drainable soft tissue abscess or pyomyositis. 2. T2 signal abnormality in the posterior and inferior calcaneus is suspicious for early osteomyelitis. 3. No findings for septic arthritis. 4. 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 Electronically Signed   By: [name] RobertsD.   On: 12/12/2018 08:02    EKG:   Orders placed or performed during the hospital encounter of 12/01/18  . ED EKG  . ED EKG  . EKG 12-Lead  . EKG 12-Lead  . EKG    ASSESSMENT AND PLAN:   53 year old female with past medical history significant for schizoaffective disorder, hypertension, diabetes, depression [facility] to hospital secondary to worsening left heel ulceration.  1.  Osteomyelitis of left calcaneus-appreciate podiatry from currently on broad-spectrum IV patient going for debridement and will follow up on Intra-Op cultures for IV antibiotics at home -Currently on Vanco and cefepime  2.  Diabetes mellitus-on Lantus and sliding scale  3.  Hyponatremia-improving with IV fluids  4.  Hypertension-on lisinopril  5.  Anxiety-on Paxil, Abilify and bupropion  6.  DVT prophylaxis-can be started after surgery  Physical therapy consult tomorrow and need for rehab placement   All the records are reviewed and  case discussed with Care Management/Social Workerr. Management plans discussed with the patient, family and they are in agreement.  CODE STATUS: Full Code  TOTAL TIME TAKING CARE OF THIS PATIENT: 38 minutes.   POSSIBLE D/C IN 1-2 DAYS, DEPENDING ON CLINICAL CONDITION.   Gabriela RobertsD on 12/13/2018 at 4:03 PM  Between 7am to 6pm - Pager - [phone]  After 6pm go to [email or web address] - password [email or web address] [facility]  Office  [phone]  CC: Primary care physician; [name], DO

## 2018-12-14 LAB — SEDIMENTATION RATE: Sed Rate: 92 mm/hr — ABNORMAL HIGH (ref 0–30)

## 2018-12-14 LAB — BASIC METABOLIC PANEL
Anion gap: 7 (ref 5–15)
BUN: 15 mg/dL (ref 6–20)
CO2: 23 mmol/L (ref 22–32)
Calcium: 9.1 mg/dL (ref 8.9–10.3)
Chloride: 104 mmol/L (ref 98–111)
Creatinine, Ser: 1.12 mg/dL — ABNORMAL HIGH (ref 0.44–1.00)
GFR calc Af Amer: >60 mL/min (ref >60)
GFR calc non Af Amer: 56 mL/min — ABNORMAL LOW (ref >60)
Glucose, Bld: 214 mg/dL — ABNORMAL HIGH (ref 70–99)
Potassium: 5 mmol/L (ref 3.5–5.1)
Sodium: 134 mmol/L — ABNORMAL LOW (ref 135–145)

## 2018-12-14 LAB — CBC
HCT: 30.8 % — ABNORMAL LOW (ref 36.0–46.0)
Hemoglobin: 9.4 g/dL — ABNORMAL LOW (ref 12.0–15.0)
MCH: 25.6 pg — ABNORMAL LOW (ref 26.0–34.0)
MCHC: 30.5 g/dL (ref 30.0–36.0)
MCV: 83.9 fL (ref 80.0–100.0)
Platelets: 330 10*3/uL (ref 150–400)
RBC: 3.67 MIL/uL — ABNORMAL LOW (ref 3.87–5.11)
RDW: 16.5 % — ABNORMAL HIGH (ref 11.5–15.5)
WBC: 10.3 10*3/uL (ref 4.0–10.5)
nRBC: 0 % (ref 0.0–0.2)

## 2018-12-14 LAB — GLUCOSE, CAPILLARY
Glucose-Capillary: 170 mg/dL — ABNORMAL HIGH (ref 70–99)
Glucose-Capillary: 197 mg/dL — ABNORMAL HIGH (ref 70–99)
Glucose-Capillary: 225 mg/dL — ABNORMAL HIGH (ref 70–99)
Glucose-Capillary: 217 mg/dL — ABNORMAL HIGH (ref 70–99)

## 2018-12-14 LAB — HEMOGLOBIN A1C
Hgb A1c MFr Bld: 10.9 % — ABNORMAL HIGH (ref 4.8–5.6)
Mean Plasma Glucose: 266.13 mg/dL

## 2018-12-14 LAB — SARS CORONAVIRUS 2 BY RT PCR (HOSPITAL ORDER, PERFORMED IN ~~LOC~~ HOSPITAL LAB): SARS Coronavirus 2: NEGATIVE

## 2018-12-14 MED ORDER — ENOXAPARIN SODIUM 40 MG/0.4ML ~~LOC~~ SOLN
40.0000 mg | SUBCUTANEOUS | Status: DC
  Administered 2018-12-14 – 2018-12-16 (×3): 40 mg via SUBCUTANEOUS
  Filled 2018-12-14 (×4): qty 0.4

## 2018-12-14 NOTE — Progress Notes (Signed)
Pharmacy Antibiotic Note  [name] is a 53 y.o. female admitted on 12/11/2018. Patient was sent from podiatrist's office today for worsening ulcer on left foot. Pharmacy has been consulted for vancomycin and cefepime dosing.  Plan: Vancomycin 2500 mg IV x 1 loading dose followed by vancomycin 750 mg IV Q 12 hrs.  Goal AUC 400-550. Expected AUC: 403 SCr used: 1.02  -Cefepime 2gm IV q8h  8/14: no signs of OM clinically per surgeon note.s/p I&D with Vancomycin Antibiotic bead placement 8/13 F/u cx    Height: 5' 10.51" (179.1 cm) Weight: 268 lb 10.1 oz (121.9 kg) IBW/kg (Calculated) : 69.68  Temp (24hrs), Avg:97.4 F (36.3 C), Min:96.8 F (36 C), Max:98.2 F (36.8 C)  Recent Labs  Lab 12/11/18 1604 12/12/18 0524 12/13/18 0353 12/14/18 0335  WBC 12.2* 10.9*  --  10.3  CREATININE 1.02* 1.02* 1.02* 1.12*    Estimated Creatinine Clearance: 82.1 mL/min (A) (by C-G formula based on SCr of 1.12 mg/dL (H)).    No Known Allergies  Antimicrobials this admission: Vancomycin 8/11 >>  Cefepime 8/11 >>   Dose adjustments this admission: NA  Microbiology results: 8/11 BCx: pending 8/11 UCx: pending?  8/12 Wound cx: abundant GNR, mod GPC, GN coccobacilli, rare GPR 8/13 knee abscess: mod GPC, rare GNR 8/13 Bone cx: NGx24hr  Thank you for allowing pharmacy to be a part of this patient's care.  Noralee Space, PharmD Clinical Pharmacist 12/14/2018 10:19 AM

## 2018-12-14 NOTE — Evaluation (Signed)
Occupational Therapy Evaluation Patient Details Name: [name] MRN: [id number] DOB: March 22, 1966 Today's Date: 12/14/2018    History of Present Illness Patient is a pleasant 53 year old female admitted on 12/11/2018 after being sent from podiatrist's office for worsening diabetic ulcer on left foot (DM type II). Pt subsequently diagnosed with acute osteomyelitis of the left calcaneus. Underwent surgical debriedment on 8/13. PMH also includes BLE heel ulcers, depression, HLD, HTN, neuromuscular disorder, scho affective schizophrenia, peripheral neuropathy.   Clinical Impression   [name] was seen for OT/PT co-evaluation this date. POD #1 from above sx. Prior to hospital admission, pt endorses being independent in most aspects of BADL, despite some difficulty with LB dressing tasks. Pt states she also has trouble with medication mgt, and her sister assists with grocery shopping to support her and her mother. Pt lives with, and cares for, her 103 year old mother. The two share a 1 level home with 2 steps to enter. Pt endorses driving and getting around as much as she is able up until a few months ago. Currently pt requires min to moderate assist for LB dressing tasks in a seated position due to decreased ROM and being NWB through her LLE. Total assist to don post-op shoe on this date. Pt would benefit from skilled OT to address noted impairments and functional limitations (see below for any additional details) in order to maximize safety and independence while minimizing falls risk and caregiver burden.  Upon hospital discharge, recommend pt discharge to STR to maximize safety and return to PLOF.     Follow Up Recommendations  SNF    Equipment Recommendations  3 in 1 bedside commode    Recommendations for Other Services       Precautions / Restrictions Precautions Precautions: Fall Required Braces or Orthoses: Other Brace Other Brace: Post-op shoe on for  transfers. Restrictions Weight Bearing Restrictions: Yes LLE Weight Bearing: Non weight bearing Other Position/Activity Restrictions: Per order, small amount of LLE WB OK with post-op shoe on during functional transfers.      Mobility Bed Mobility Overal bed mobility: Modified Independent             General bed mobility comments: Sup>sit with HOB elevated and min VC's for technique.  Transfers Overall transfer level: Needs assistance Equipment used: Rolling walker (2 wheeled) Transfers: Sit to/from Stand Sit to Stand: +2 safety/equipment;Min assist;From elevated surface;Min guard              Balance Overall balance assessment: Needs assistance Sitting-balance support: Single extremity supported;Feet supported(RLE supported LLE resting on floor as minimally weight bearing as possible with post-op shoe.) Sitting balance-Leahy Scale: Good Sitting balance - Comments: Steady reaching within BOS. Good static and dynamic sitting balance. Able to weight shift to bring self closer to EOB.     Standing balance-Leahy Scale: Fair Standing balance comment: Requires heavy UE use on RW to maintain NWB status through LLE.                           ADL either performed or assessed with clinical judgement   ADL Overall ADL's : Needs assistance/impaired Eating/Feeding: Set up;Sitting;Independent   Grooming: Set up;Sitting;Independent   Upper Body Bathing: Set up;Sitting;Independent   Lower Body Bathing: Set up;Minimal assistance;Sitting/lateral leans;With adaptive equipment   Upper Body Dressing : Sitting;Set up;Independent   Lower Body Dressing: Sit to/from stand;Set up;Minimal assistance;Moderate assistance;With adaptive equipment   Toilet Transfer: BSC;Stand-pivot;RW;Minimal assistance  Toileting- Water quality scientist and Hygiene: Min guard;Minimal assistance;Sit to/from stand;With adaptive equipment       Functional mobility during ADLs: Rolling  walker;Minimal assistance;Cueing for safety General ADL Comments: Pt eager to learn more about AE options for LB ADL tasks including dressing and bathing. Education limited at time of OT evaluation.     Vision Baseline Vision/History: Wears glasses Wears Glasses: Reading only Patient Visual Report: Blurring of vision Additional Comments: Pt endorses some difficulty with vision for some time now. States she recently got new glasses Rx, but they were not helpful. Will continue to assess during functional activity.     Perception     Praxis      Pertinent Vitals/Pain Pain Assessment: 0-10 Pain Score: 0-No pain Pain Location: Denies pain on this date. Pain Intervention(s): Limited activity within patient's tolerance;Monitored during session     Hand Dominance Right   Extremity/Trunk Assessment Upper Extremity Assessment Upper Extremity Assessment: Overall WFL for tasks assessed([facility]   Lower Extremity Assessment Lower Extremity Assessment: Defer to PT evaluation;LLE deficits/detail LLE Deficits / Details: LLE NWB s/p wound debriedment. Must wear post-op shoe for mobility.       Communication Communication Communication: HOH(Endorses being mildly HOH relies on lip reading which is obstructed by masks at this time.)   Cognition Arousal/Alertness: Awake/alert Behavior During Therapy: [facility] for tasks assessed/performed Overall Cognitive Status: Within Functional Limits for tasks assessed                                     General Comments  Post-op shoe in place at start/end of session. LLE heel protector in place at end of session. RLE elevated off surface with pillow.    Exercises Other Exercises Other Exercises: Pt educated in safe transfer strategies, safe use of AE for mobility, and medication mgt strategies on this date. AE education limited as pt daughter called at end of session. Pt eager to learn more about AE for LB ADL tasks as well as additional  strategies to support her medication mgt once home.   Shoulder Instructions      Home Living Family/patient expects to be discharged to:: Private residence Living Arrangements: Parent(46 year old mother who has "moderate dementia") Available Help at Discharge: Family;Available PRN/intermittently Type of Home: House Home Access: Stairs to enter [employer] of Steps: 2 Entrance Stairs-Rails: Right Home Layout: One level     Bathroom Shower/Tub: Tub/shower unit;Curtain   Bathroom Toilet: Standard(With riser)     Home Equipment: Grab bars - tub/shower;Walker - 2 wheels;Shower seat;Toilet riser   Additional Comments: Lives with and provides care for her mother with help from siblings.      Prior Functioning/Environment Level of Independence: Independent        Comments: States she does not use AE in the home. Is generally independent in all aspects of BADL. Endorses some difficulty with medication mgt, and desire to learn about strategies to support improved adherence to medication regimen.        OT Problem List: Decreased activity tolerance;Decreased safety awareness;Obesity;Impaired balance (sitting and/or standing);Decreased knowledge of use of DME or AE;Decreased knowledge of precautions;Impaired sensation;Decreased strength;Decreased coordination      OT Treatment/Interventions: Self-care/ADL training;Therapeutic exercise;Therapeutic activities;DME and/or AE instruction;Patient/family education    OT Goals(Current goals can be found in the care plan section) Acute Rehab OT Goals Patient Stated Goal: To be as independent as possible. OT Goal Formulation: With patient  Time For Goal Achievement: 12/28/18 Potential to Achieve Goals: Good ADL Goals Pt Will Perform Lower Body Bathing: sitting/lateral leans;with adaptive equipment;with min assist(With LRAD PRN for improved safety and functional independence.) Pt Will Perform Lower Body Dressing: with adaptive  equipment;sit to/from stand;with min assist(With LRAD PRN for improved safety and functional independence.) Pt Will Transfer to Toilet: bedside commode;ambulating;with supervision(With LRAD PRN for improved safety and functional independence.) Additional ADL Goal #1: Pt will independently verbalize a plan to implement at least 2 learned medication safety and managment strategies into her daily routines upon hospital DC.  OT Frequency: Min 1X/week   Barriers to D/C: Decreased caregiver support          Co-evaluation PT/OT/SLP Co-Evaluation/Treatment: Yes Reason for Co-Treatment: For patient/therapist safety;To address functional/ADL transfers PT goals addressed during session: Mobility/safety with mobility;Balance;Strengthening/ROM OT goals addressed during session: ADL's and self-care;Proper use of Adaptive equipment and DME      AM-PAC OT "6 Clicks" Daily Activity     Outcome Measure Help from another person eating meals?: None Help from another person taking care of personal grooming?: A Little Help from another person toileting, which includes using toliet, bedpan, or urinal?: A Lot Help from another person bathing (including washing, rinsing, drying)?: A Lot Help from another person to put on and taking off regular upper body clothing?: A Little Help from another person to put on and taking off regular lower body clothing?: A Lot 6 Click Score: 16   End of Session Equipment Utilized During Treatment: Gait belt;Rolling walker  Activity Tolerance: Patient tolerated treatment well Patient left: in chair;with call bell/phone within reach;with chair alarm set;Other (comment)(Heel protector boot in place)  OT Visit Diagnosis: Other abnormalities of gait and mobility (R26.89)                Time: 1014-1040 OT Time Calculation (min): 26 min Charges:  OT General Charges $OT Visit: 1 Visit OT Evaluation $OT Eval Moderate Complexity: 1 Mod OT Treatments $Self Care/Home Management :  8-22 mins  Shara Blazing, M.S., OTR/L Ascom: [phone] 12/14/18, 11:33 AM

## 2018-12-14 NOTE — Discharge Instructions (Signed)
Podiatry discharge instructions: 1.  Remain partial weightbearing to nonweightbearing on the left lower extremity at all times.  Only put small amount of pressure on left foot in surgical shoe for transfers only. 2.  Take antibiotics and pain medications as prescribed to prescribed. 3.  Leave dressings clean, dry, and intact until postoperative visit.  If dressing becomes loose or saturated, remove dressing to the level of the incision site.  Apply Xeroform to the incision site, 4 x 4 gauze, ABD, Kerlix, Ace wrap. 4.  Follow-up in clinic within 1 week of discharge date, discharge instructions placed on chart to call clinic for appointment.

## 2018-12-14 NOTE — Progress Notes (Signed)
[facility] [place] NAME: [name]    MR#:  [id number]  DATE OF BIRTH:  1965/11/30  SUBJECTIVE:  CHIEF COMPLAINT:  No chief complaint on file.  -s/p left heel wound debridement, clinically no osteomyelitis per podiatry - patient doing well  REVIEW OF SYSTEMS:  Review of Systems  Constitutional: Positive for malaise/fatigue. Negative for chills and fever.  HENT: Negative for congestion, ear discharge, hearing loss and nosebleeds.   Eyes: Negative for blurred vision and double vision.  Respiratory: Negative for cough, shortness of breath and wheezing.   Cardiovascular: Negative for chest pain and palpitations.  Gastrointestinal: Negative for abdominal pain, constipation, diarrhea, nausea and vomiting.  Genitourinary: Negative for dysuria.  Musculoskeletal: Positive for joint pain and myalgias.  Neurological: Negative for dizziness, focal weakness, seizures, weakness and headaches.  Psychiatric/Behavioral: Negative for depression.    DRUG ALLERGIES:  No Known Allergies  VITALS:  Blood pressure 133/82, pulse 93, temperature 97.8 F (36.6 C), resp. rate 18, height 5' 10.51" (1.791 m), weight 121.9 kg, SpO2 98 %.  PHYSICAL EXAMINATION:  Physical Exam   GENERAL:  53 y.o.-year-old obese patient lying in the bed with no acute distress.  EYES: Pupils equal, round, reactive to light and accommodation. No scleral icterus. Extraocular muscles intact.  HEENT: Head atraumatic, normocephalic. Oropharynx and nasopharynx clear.  NECK:  Supple, no jugular venous distention. No thyroid enlargement, no tenderness.  LUNGS: Normal breath sounds bilaterally, no wheezing, rales,rhonchi or crepitation. No use of accessory muscles of respiration.  Decreased bibasilar breath sounds CARDIOVASCULAR: S1, S2 normal. No murmurs, rubs, or gallops.  ABDOMEN: Soft, nontender, nondistended. Bowel sounds present. No organomegaly or mass.  EXTREMITIES: No  pedal edema, cyanosis, or clubbing.  Left heel in dressing and heavy boot NEUROLOGIC: Cranial nerves II through XII are intact. Muscle strength 5/5 in all extremities. Sensation intact. Gait not checked.  PSYCHIATRIC: The patient is alert and oriented x 3.  SKIN: No obvious rash, lesion, or ulcer.    LABORATORY PANEL:   CBC Recent Labs  Lab 12/14/18 0335  WBC 10.3  HGB 9.4*  HCT 30.8*  PLT 330   ------------------------------------------------------------------------------------------------------------------  Chemistries  Recent Labs  Lab 12/11/18 1604  12/14/18 0335  NA 128*   < > 134*  K 5.1   < > 5.0  CL 96*   < > 104  CO2 22   < > 23  GLUCOSE 105*   < > 214*  BUN 17   < > 15  CREATININE 1.02*   < > 1.12*  CALCIUM 9.3   < > 9.1  AST 20  --   --   ALT 22  --   --   ALKPHOS 113  --   --   BILITOT 0.2*  --   --    < > = values in this interval not displayed.   ------------------------------------------------------------------------------------------------------------------  Cardiac Enzymes No results for input(s): TROPONINI in the last 168 hours. ------------------------------------------------------------------------------------------------------------------  RADIOLOGY:  Dg Foot 2 Views Left  Result Date: 12/13/2018 CLINICAL DATA:  Status post I and D with wound and bone debridement left heel. Antibiotic bead placement. EXAM: LEFT FOOT - 2 VIEW COMPARISON:  Preoperative radiograph 12/01/2018 FINDINGS: Placement of antibiotic beads abutting the posteroinferior calcaneus. No bony destructive change by radiograph. Associated soft tissue edema/skin defect. Plantar calcaneal spur and Achilles tendon enthesophyte. Remainder the exam is unchanged. IMPRESSION: Interval placement of antibiotic beads abutting the posteroinferior calcaneus. Electronically Signed  By: Keith Rake M.D.   On: 12/13/2018 20:41    EKG:   Orders placed or performed during the hospital  encounter of 12/01/18  . ED EKG  . ED EKG  . EKG 12-Lead  . EKG 12-Lead  . EKG    ASSESSMENT AND PLAN:   53 year old female with past medical history significant for schizoaffective disorder, hypertension, diabetes, depression [facility] to hospital secondary to worsening left heel ulceration.  1.  Osteomyelitis of left calcaneus-appreciate podiatry consult.  Status post wound debridement in OR yesterday. -No clinical evidence of osteomyelitis.  Wound cultures Intra-Op growing mostly gram-negative rods and gram-positive cocci -Continue vancomycin and cefepime for now.  Likely change to oral antibiotics at discharge tomorrow -Continue current dressing by podiatry until outpatient follow-up.  Nonweightbearing to touch toe weightbearing as tolerated with physical therapy. -Patient will need rehab at discharge.  2.  Diabetes mellitus-on Lantus and sliding scale  3.  Hyponatremia-improving with IV fluids  4.  Hypertension-monitor and add Norvasc if needed.  Discontinue lisinopril due to consistently elevated potassium level  5.  Anxiety-on Paxil, Abilify and bupropion  6.  DVT prophylaxis-we will start Lovenox  Physical therapy consulted, will need rehab at discharge. -Anticipate discharge tomorrow   All the records are reviewed and case discussed with Care Management/Social Workerr. Management plans discussed with the patient, family and they are in agreement.  CODE STATUS: Full Code  TOTAL TIME TAKING CARE OF THIS PATIENT: 38 minutes.   POSSIBLE D/C IN 1-2 DAYS, DEPENDING ON CLINICAL CONDITION.   Gladstone Lighter M.D on 12/14/2018 at 1:40 PM  Between 7am to 6pm - Pager - [phone]  After 6pm go to [email or web address] - password [email or web address] [facility]  Office  [phone]  CC: Primary care physician; [name], DO

## 2018-12-14 NOTE — Progress Notes (Signed)
PODIATRY / FOOT AND ANKLE SURGERY PROGRESS NOTE  Reason for consult: L medial heel ulceration, possible osteomyelitis  Chief Complaint: L heel wound   HPI: [name] is a 53 y.o. female who presents resting in bed status post 1 day left heel wound and bone debridement with bone biopsy of the calcaneus.  Patient is in minimal pain at this time.  Patient states that she will be going to a skilled rehab facility upon discharge.  Patient denies nausea, vomiting, fever, and chills.  PMHx:  Past Medical History:  Diagnosis Date  . Depression   . Diabetes mellitus without complication ([place])   . Hyperlipidemia   . Hypertension   . Neuromuscular disorder ([place])   . Schizo affective schizophrenia ([employer])    abilify and pazil    Surgical Hx:  Past Surgical History:  Procedure Laterality Date  . CHOLECYSTECTOMY    . IRRIGATION AND DEBRIDEMENT FOOT Right 02/26/2016   Procedure: RIGHT 2ND TOE DEBRIDEMENT;  Surgeon: Samara Deist, DPM;  Location: [facility] ORS;  Service: Podiatry;  Laterality: Right;  . IRRIGATION AND DEBRIDEMENT FOOT Left 12/13/2018   Procedure: IRRIGATION AND DEBRIDEMENT FOOT;  Surgeon: Caroline More, DPM;  Location: [facility] ORS;  Service: Podiatry;  Laterality: Left;    FHx:  Family History  Problem Relation Age of Onset  . Breast cancer Cousin        maternal cousin  . Hypertension Mother   . Hyperthyroidism Mother   . Diabetes Maternal Grandmother   . Hypertension Maternal Grandmother   . Cancer Maternal Grandmother        unknown type  . Diabetes Maternal Grandfather   . Hypertension Maternal Grandfather   . Diabetes Paternal Grandmother   . Hypertension Paternal Grandmother   . Diabetes Paternal Grandfather   . Hypertension Paternal Grandfather     Social History:  reports that she quit smoking about 9 years ago. Her smoking use included cigarettes. She has a 90.00 pack-year smoking history. She has quit using smokeless tobacco. She reports that she does not  drink alcohol or use drugs.  Allergies: No Known Allergies  Review of Systems: General ROS: negative Respiratory ROS: no cough, shortness of breath, or wheezing Cardiovascular ROS: no chest pain or dyspnea on exertion Gastrointestinal ROS: no abdominal pain, change in bowel habits, or black or bloody stools Musculoskeletal ROS: positive for - joint pain Neurological ROS: positive for - numbness/tingling Dermatological ROS: positive for skin lesion changes and L heel medial ulceration  Medications Prior to Admission  Medication Sig Dispense Refill  . ARIPiprazole (ABILIFY) 10 MG tablet Take 10 mg by mouth daily.    [name] atorvastatin (LIPITOR) 40 MG tablet Take 40 mg by mouth daily.    [name] buPROPion (WELLBUTRIN XL) 150 MG 24 hr tablet Take 150 mg by mouth daily. Take in AM    . HUMALOG KWIKPEN 100 UNIT/ML KwikPen Inject 0.05 mLs (5 Units total) into the skin 3 (three) times daily with meals. + Sliding scale 15 mL 5  . Insulin Glargine (LANTUS SOLOSTAR) 100 UNIT/ML Solostar Pen Inject 50 Units into the skin daily. Adjust dose as advised (Patient taking differently: Inject 52 Units into the skin daily. ) 15 mL 1  . lisinopril (PRINIVIL,ZESTRIL) 20 MG tablet TAKE 1 TABLET(20 MG) BY MOUTH DAILY (Patient taking differently: Take 20 mg by mouth daily. ) 90 tablet 1  . metFORMIN (GLUCOPHAGE) 1000 MG tablet TAKE 1 TABLET(1000 MG) BY MOUTH TWICE DAILY WITH A MEAL 180 tablet 1  .  PARoxetine (PAXIL) 20 MG tablet Take 1 tablet (20 mg total) by mouth daily. Prescribed by [facility] Psychiatry 30 tablet 0  . acetaminophen (TYLENOL) 325 MG tablet Take 2 tablets (650 mg total) by mouth every 6 (six) hours as needed for mild pain (or Fever >/= 101). (Patient not taking: Reported on 10/26/2018)    . amoxicillin-clavulanate (AUGMENTIN) 875-125 MG tablet Take 1 tablet by mouth 2 (two) times daily. For 10 day (Patient not taking: Reported on 12/11/2018) 20 tablet 0  . B-D UF III MINI PEN NEEDLES 31G X 5 MM MISC USE AS DIRECTED  100 each 3  . Blood Glucose Monitoring Suppl (ONETOUCH VERIO) w/Device KIT Use to check blood sugar up to 3 x daily 1 kit 0  . Lancets (ONETOUCH ULTRASOFT) lancets Use to check blood sugar up to 3 x daily 100 each 12  . ONETOUCH VERIO test strip Check blood sugar up to 3 x daily as needed 100 each 11    Physical Exam: General: Alert and oriented.  No apparent distress.  Vascular: DP/PT pulses intact bilaterally, CFT intact bilaterally.  Neuro: Epicritic sensation diminished to bilateral lower extremities via light touch.  Derm: Incision appears to be well coapted with sutures intact to the left medial heel, no open wounds at this time, serous drainage only at this time, mild peri-incisional erythema and edema, stable and consistent with postop course.  MSK: Mild pain on palpation of the left heel.  5 out of 5 muscle strength to bilateral lower extremities.  Results for orders placed or performed during the hospital encounter of 12/11/18 (from the past 48 hour(s))  Glucose, capillary     Status: Abnormal   Collection Time: 12/12/18  4:56 PM  Result Value Ref Range   Glucose-Capillary 124 (H) 70 - 99 mg/dL   Comment 1 Notify RN   Aerobic Culture (superficial specimen)     Status: None (Preliminary result)   Collection Time: 12/12/18  5:06 PM   Specimen: Wound  Result Value Ref Range   Specimen Description      WOUND Performed at [facility], [address], [place], [place] [address]    Special Requests      Normal Performed at [facility], [place], [place] [address]    Gram Stain      NO WBC SEEN ABUNDANT GRAM NEGATIVE RODS MODERATE GRAM POSITIVE COCCI MODERATE GRAM NEGATIVE COCCOBACILLI RARE GRAM POSITIVE RODS    Culture      TOO YOUNG TO READ Performed at [facility], [place] [address], [place], [place] [address]    Report Status PENDING   Glucose, capillary     Status: Abnormal   Collection Time: 12/12/18  9:14 PM  Result  Value Ref Range   Glucose-Capillary 206 (H) 70 - 99 mg/dL  Basic metabolic panel     Status: Abnormal   Collection Time: 12/13/18  3:53 AM  Result Value Ref Range   Sodium 133 (L) 135 - 145 mmol/L   Potassium 4.8 3.5 - 5.1 mmol/L   Chloride 104 98 - 111 mmol/L   CO2 22 22 - 32 mmol/L   Glucose, Bld 156 (H) 70 - 99 mg/dL   BUN 15 6 - 20 mg/dL   Creatinine, Ser 1.02 (H) 0.44 - 1.00 mg/dL   Calcium 8.9 8.9 - 10.3 mg/dL   GFR calc non Af Amer >60 >60 mL/min   GFR calc Af Amer >60 >60 mL/min   Anion gap  7 5 - 15    Comment: Performed at [facility], [address] [place], [place] [address]  Glucose, capillary     Status: Abnormal   Collection Time: 12/13/18  7:39 AM  Result Value Ref Range   Glucose-Capillary 171 (H) 70 - 99 mg/dL  Glucose, capillary     Status: Abnormal   Collection Time: 12/13/18 11:38 AM  Result Value Ref Range   Glucose-Capillary 141 (H) 70 - 99 mg/dL  Aerobic/Anaerobic Culture (surgical/deep wound)     Status: None (Preliminary result)   Collection Time: 12/13/18 12:34 PM   Specimen: [facility] Other; Tissue  Result Value Ref Range   Specimen Description      KNEE Performed at [facility], [address], [place], [place] [address]    Special Requests      LEFT KNEE ABSCESS Performed at [facility], [address] [place], [place] [address]    Gram Stain      NO WBC SEEN MODERATE GRAM POSITIVE COCCI RARE GRAM NEGATIVE RODS    Culture      TOO YOUNG TO READ Performed at [facility], [place] [address], [place], [place] [address]    Report Status PENDING   Aerobic/Anaerobic Culture (surgical/deep wound)     Status: None (Preliminary result)   Collection Time: 12/13/18 12:35 PM   Specimen: [facility] Other; Tissue  Result Value Ref Range   Specimen Description      BONE Performed at [facility], [address], [place], [place] [address]    Special Requests      LEFT CALCANEOUS BONE Performed at [facility], [place], [place] [address]    Gram Stain NO WBC SEEN NO ORGANISMS SEEN     Culture      NO GROWTH < 24 HOURS Performed at [facility], [place] [address], [place], [place] [address]    Report Status PENDING   Glucose, capillary     Status: Abnormal   Collection Time: 12/13/18  1:05 PM  Result Value Ref Range   Glucose-Capillary 149 (H) 70 - 99 mg/dL  Glucose, capillary     Status: Abnormal   Collection Time: 12/13/18  4:32 PM  Result Value Ref Range   Glucose-Capillary 209 (H) 70 - 99 mg/dL  Glucose, capillary     Status: Abnormal   Collection Time: 12/13/18  9:14 PM  Result Value Ref Range   Glucose-Capillary 177 (H) 70 - 99 mg/dL  CBC     Status: Abnormal   Collection Time: 12/14/18  3:35 AM  Result Value Ref Range   WBC 10.3 4.0 - 10.5 K/uL   RBC 3.67 (L) 3.87 - 5.11 MIL/uL   Hemoglobin 9.4 (L) 12.0 - 15.0 g/dL   HCT 30.8 (L) 36.0 - 46.0 %   MCV 83.9 80.0 - 100.0 fL   MCH 25.6 (L) 26.0 - 34.0 pg   MCHC 30.5 30.0 - 36.0 g/dL   RDW 16.5 (H) 11.5 - 15.5 %   Platelets 330 150 - 400 K/uL   nRBC 0.0 0.0 - 0.2 %    Comment: Performed at [facility], [address] [place], [place] [address]  Basic metabolic panel     Status: Abnormal   Collection Time: 12/14/18  3:35 AM  Result Value Ref Range   Sodium 134 (L) 135 - 145 mmol/L   Potassium 5.0 3.5 - 5.1 mmol/L   Chloride 104 98 - 111  mmol/L   CO2 23 22 - 32 mmol/L   Glucose, Bld 214 (H) 70 - 99 mg/dL   BUN 15 6 - 20 mg/dL   Creatinine, Ser 1.12 (H) 0.44 - 1.00 mg/dL   Calcium 9.1 8.9 - 10.3 mg/dL   GFR calc non Af Amer 56 (L) >60 mL/min   GFR calc Af Amer >60 >60 mL/min   Anion gap 7 5 - 15    Comment: Performed at [facility], [address] [place], [place] [address]  Sedimentation rate     Status: Abnormal   Collection Time: 12/14/18  3:35 AM  Result Value Ref Range   Sed Rate 92 (H) 0 - 30 mm/hr    Comment: Performed at [facility], [address] [place], [place] [address]  Glucose, capillary     Status: Abnormal   Collection Time: 12/14/18  7:35 AM  Result Value Ref Range   Glucose-Capillary 170 (H) 70 - 99 mg/dL  Glucose, capillary     Status: Abnormal   Collection Time: 12/14/18 11:41 AM  Result Value Ref Range   Glucose-Capillary 197 (H) 70 - 99 mg/dL   Dg Foot 2 Views Left  Result Date: 12/13/2018 CLINICAL DATA:  Status post I and D with wound and bone debridement left heel. Antibiotic bead placement. EXAM: LEFT FOOT - 2 VIEW COMPARISON:  Preoperative radiograph 12/01/2018 FINDINGS: Placement of antibiotic beads abutting the posteroinferior calcaneus. No bony destructive change by radiograph. Associated soft tissue edema/skin defect. Plantar calcaneal spur and Achilles tendon enthesophyte. Remainder the exam is unchanged. IMPRESSION: Interval placement of antibiotic beads abutting the posteroinferior calcaneus. Electronically Signed   By: Keith Rake M.D.   On: 12/13/2018 20:41    Blood pressure 133/82, pulse 93, temperature 97.8 F (36.6 C), resp. rate 18, height 5' 10.51" (1.791 m), weight 121.9 kg, SpO2 98 %.   Assessment 1. 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 Cellulitis left foot 3. Uncontrolled diabetes type 2 with polyneuropathy  Plan -Examined both feet. -Dressing removed from left foot.  Incision site appears to be stable with mild peri-incisional erythema and edema with serous drainage only, sutures intact. -Clinically did not feel that the calcaneus appeared to be infected during surgical intervention. -Viewed culture results.  Bone biopsy negative for growth to date.  Wound culture: Gram-negative rods, gram-positive cocci, gram-negative cocci bacilli, gram-positive rods rare.  Final reports pending. -WBC trending down at 10.3, afebrile with vital signs stable.  Appreciate recs for antibiotic therapy at this time.   Likely can transition from IV to oral antibiotics based on culture results.  Culture results still pending. -PT/OT ordered, NWB L heel and minimal contact for transfers with surgical shoe.  Prevalon boots ordered.  Patient to be discharged to skilled nursing facility.  When medically stable believe patient can be discharged to skilled nursing facility once placement is open.  Orders placed in chart.  Leave dressing clean dry and intact until postoperative visit.  Would like to see patient within 1 week of discharge date in clinic.  Caroline More 12/14/2018, 12:56 PM

## 2018-12-14 NOTE — Evaluation (Signed)
Physical Therapy Evaluation Patient Details Name: [name] MRN: [id number] DOB: 05-25-1965 Today's Date: 12/14/2018   History of Present Illness  Patient is a pleasant 53 year old female admitted on 12/11/2018 after being sent from podiatrist's office for worsening diabetic ulcer on left foot (DM type II). Pt subsequently diagnosed with acute osteomyelitis of the left calcaneus. Underwent surgical debriedment on 8/13. PMH also includes BLE heel ulcers, depression, HLD, HTN, neuromuscular disorder, scho affective schizophrenia, peripheral neuropathy.    Clinical Impression  Patient alert, oriented, very pleasant during session. Pt reported that she lives with her elderly mother who has dementia in a one story home, 2 steps to enter. Previously independent, but has had increasing difficulty with IADLs/ADLs due to weakness.   Patient and PT reviewed a few therapeutic exercises, pt able to perform with verbal/tactile cues to maintain/increase strength as able. Pt demonstrated bed mobility with mod I, use of bed rails. Pt able to sit EOB with good balance. Educated on weight bearing status prior to mobility, post op shoe on LLE in place. Sit <> stand with minA and good adherence to weight bearing status. Pt was able to take several small steps towards recliner in room with CGA and RW. Pt educated and instructed in step to gait pattern with RW, as well as weight bearing precautions. step by step cues for sequencing.  Overall the patient demonstrated deficits (see "PT Problem List") that impede the patient's functional abilities, safety, and mobility and would benefit from skilled PT intervention. Recommendation is STR to maximize pt education, safety, and mobility.     Follow Up Recommendations SNF    Equipment Recommendations       Recommendations for Other Services OT consult     Precautions / Restrictions Precautions Precautions: Fall Precaution Comments: NWB, but orders are for very  little weight as possible Required Braces or Orthoses: Other Brace Other Brace: Post-op shoe on for transfers. Restrictions Weight Bearing Restrictions: Yes LLE Weight Bearing: Non weight bearing Other Position/Activity Restrictions: Per order, small amount of LLE WB OK with post-op shoe on during functional transfers.      Mobility  Bed Mobility Overal bed mobility: Modified Independent             General bed mobility comments: Sup>sit with HOB elevated and min VC's for technique, use of bed rails  Transfers Overall transfer level: Needs assistance Equipment used: Rolling walker (2 wheeled) Transfers: Sit to/from Stand Sit to Stand: +2 safety/equipment;Min assist;From elevated surface;Min guard            Ambulation/Gait Ambulation/Gait assistance: Min guard Gait Distance (Feet): 2 Feet Assistive device: Rolling walker (2 wheeled)       General Gait Details: Pt educated and instructed in step to gait pattern with RW, as well as weight bearing precautions. step by step cues for sequencing.  Stairs            Wheelchair Mobility    Modified Rankin (Stroke Patients Only)       Balance Overall balance assessment: Needs assistance Sitting-balance support: Single extremity supported;Feet supported Sitting balance-Leahy Scale: Good Sitting balance - Comments: Steady reaching within BOS. Good static and dynamic sitting balance. Able to weight shift to bring self closer to EOB.     Standing balance-Leahy Scale: Fair Standing balance comment: Requires heavy UE use on RW to maintain NWB status through LLE  Pertinent Vitals/Pain Pain Assessment: No/denies pain Pain Score: 0-No pain Pain Location: Denies pain on this date. Pain Intervention(s): Limited activity within patient's tolerance;Monitored during session    [place] expects to be discharged to:: Private residence Living Arrangements:  Parent(63 yo mother with dementia) Available Help at Discharge: Family;Available PRN/intermittently Type of Home: House Home Access: Stairs to enter Entrance Stairs-Rails: Right Entrance Stairs-Number of Steps: 2 Home Layout: One level Home Equipment: Grab bars - tub/shower;Walker - 2 wheels;Shower seat;Toilet riser Additional Comments: Lives with and provides care for her mother with help from siblings.    Prior Function Level of Independence: Independent         Comments: Pt utilized RW for ambulation in home recently, has had trouble due to generalized weakness     Hand Dominance   Dominant Hand: Right    Extremity/Trunk Assessment   Upper Extremity Assessment Upper Extremity Assessment: Overall WFL for tasks assessed    Lower Extremity Assessment Lower Extremity Assessment: RLE deficits/detail RLE Deficits / Details: able to perform SLR, LAQ LLE Deficits / Details: LLE NWB s/p wound debriedment. Must wear post-op shoe for mobility.       Communication   Communication: HOH  Cognition Arousal/Alertness: Awake/alert Behavior During Therapy: WFL for tasks assessed/performed Overall Cognitive Status: Within Functional Limits for tasks assessed                                        General Comments General comments (skin integrity, edema, etc.): Post up in room, RN notified that she may need a bigger shoe. Elevated in prevalon boot without post op shoe at end of session    Exercises General Exercises - Lower Extremity Quad Sets: AROM;Both;5 reps;Strengthening Long Arc Quad: AROM;Both;5 reps;Strengthening Straight Leg Raises: AROM;Both;5 reps;Strengthening Other Exercises Other Exercises: Patient eager throughout session. Educated on several exercises she can perform to maintain/maximize strength   Assessment/Plan    PT Assessment Patient needs continued PT services  PT Problem List Decreased strength;Decreased mobility;Decreased activity  tolerance;Decreased balance;Decreased knowledge of use of DME;Pain       PT Treatment Interventions DME instruction;Therapeutic exercise;Gait training;Balance training;Stair training;Functional mobility training;Therapeutic activities;Patient/family education    PT Goals (Current goals can be found in the Care Plan section)  Acute Rehab PT Goals Patient Stated Goal: to go home PT Goal Formulation: With patient Time For Goal Achievement: 12/28/18 Potential to Achieve Goals: Good    Frequency Min 2X/week   Barriers to discharge        Co-evaluation   Reason for Co-Treatment: For patient/therapist safety;To address functional/ADL transfers PT goals addressed during session: Mobility/safety with mobility;Balance;Strengthening/ROM OT goals addressed during session: ADL's and self-care;Proper use of Adaptive equipment and DME       AM-PAC PT "6 Clicks" Mobility  Outcome Measure Help needed turning from your back to your side while in a flat bed without using bedrails?: A Little Help needed moving from lying on your back to sitting on the side of a flat bed without using bedrails?: A Little Help needed moving to and from a bed to a chair (including a wheelchair)?: None Help needed standing up from a chair using your arms (e.g., wheelchair or bedside chair)?: A Little Help needed to walk in hospital room?: A Little Help needed climbing 3-5 steps with a railing? : A Lot 6 Click Score: 18    End of Session Equipment Utilized  During Treatment: Gait belt Activity Tolerance: Patient tolerated treatment well Patient left: in chair;with chair alarm set;with call bell/phone within reach(Prevalon boot in place on LLE) Nurse Communication: Mobility status PT Visit Diagnosis: Other abnormalities of gait and mobility (R26.89);Difficulty in walking, not elsewhere classified (R26.2);Pain Pain - Right/Left: Left Pain - part of body: Ankle and joints of foot    Time: 1014-1040 PT Time  Calculation (min) (ACUTE ONLY): 26 min   Charges:   PT Evaluation $PT Eval Low Complexity: 1 Low PT Treatments $Therapeutic Activity: 8-22 mins        Lieutenant Diego PT, DPT 12:39 PM,12/14/18 [phone]

## 2018-12-14 NOTE — Care Management Important Message (Signed)
Important Message  Patient Details  Name: [name] MRN: [id number] Date of Birth: 05-31-1965   Medicare Important Message Given:  Yes     Dannette Barbara 12/14/2018, 11:05 AM

## 2018-12-14 NOTE — TOC Progression Note (Signed)
Transition of Care [facility]) - Progression Note    Patient Details  Name: [name] MRN: [id number] Date of Birth: 05/19/1965  Transition of Care [facility]) CM/SW Contact  Su Hilt, RN Phone Number: 12/14/2018, 3:54 PM  Clinical Narrative:     Received notification from [facility] at [facility], We have insurance authorization and the patient can DC when ready, will need a covid test to DC       Expected Discharge Plan and Services                                                 Social Determinants of Health (SDOH) Interventions    Readmission Risk Interventions Readmission Risk Prevention Plan 10/21/2018 10/18/2018 12/16/2017  Post Dischage Appt Complete - -  Medication Screening Complete - -  Transportation Screening Complete Complete Complete  PCP or Specialist Appt within 5-7 Days - - Complete  Home Care Screening - Complete Complete  Medication Review (RN CM) - Complete Complete  Some recent data might be hidden

## 2018-12-14 NOTE — TOC Progression Note (Signed)
Transition of Care [facility]) - Progression Note    Patient Details  Name: [name] MRN: [id number] Date of Birth: [birth year]  Transition of Care [facility]) CM/SW Contact  Su Hilt, RN Phone Number: 12/14/2018, 10:08 AM  Clinical Narrative:     Spoke to the patient and reviewed the bed offers, She chose [facility], I notified her daughter [name] and gave the facility phone number       Expected Discharge Plan and Services                                                 Social Determinants of Health (SDOH) Interventions    Readmission Risk Interventions Readmission Risk Prevention Plan 10/21/2018 10/18/2018 12/16/2017  Post Dischage Appt Complete - -  Medication Screening Complete - -  Transportation Screening Complete Complete Complete  PCP or Specialist Appt within 5-7 Days - - Complete  Home Care Screening - Complete Complete  Medication Review (RN CM) - Complete Complete  Some recent data might be hidden

## 2018-12-15 LAB — BASIC METABOLIC PANEL
Anion gap: 7 (ref 5–15)
BUN: 16 mg/dL (ref 6–20)
CO2: 24 mmol/L (ref 22–32)
Calcium: 9.4 mg/dL (ref 8.9–10.3)
Chloride: 103 mmol/L (ref 98–111)
Creatinine, Ser: 0.92 mg/dL (ref 0.44–1.00)
GFR calc Af Amer: >60 mL/min (ref >60)
GFR calc non Af Amer: >60 mL/min (ref >60)
Glucose, Bld: 215 mg/dL — ABNORMAL HIGH (ref 70–99)
Potassium: 4.8 mmol/L (ref 3.5–5.1)
Sodium: 134 mmol/L — ABNORMAL LOW (ref 135–145)

## 2018-12-15 LAB — AEROBIC CULTURE W GRAM STAIN (SUPERFICIAL SPECIMEN)
Gram Stain: NONE SEEN
Special Requests: NORMAL

## 2018-12-15 LAB — GLUCOSE, CAPILLARY
Glucose-Capillary: 194 mg/dL — ABNORMAL HIGH (ref 70–99)
Glucose-Capillary: 170 mg/dL — ABNORMAL HIGH (ref 70–99)
Glucose-Capillary: 240 mg/dL — ABNORMAL HIGH (ref 70–99)

## 2018-12-15 MED ORDER — AMLODIPINE BESYLATE 5 MG PO TABS
5.0000 mg | ORAL_TABLET | Freq: Every day | ORAL | Status: DC
  Administered 2018-12-15 – 2018-12-17 (×3): 5 mg via ORAL
  Filled 2018-12-15 (×3): qty 1

## 2018-12-15 MED ORDER — INSULIN GLARGINE 100 UNIT/ML ~~LOC~~ SOLN
30.0000 [IU] | Freq: Every day | SUBCUTANEOUS | Status: DC
  Administered 2018-12-16 – 2018-12-17 (×2): 30 [IU] via SUBCUTANEOUS
  Filled 2018-12-15 (×3): qty 0.3

## 2018-12-15 NOTE — Progress Notes (Signed)
Pharmacy Antibiotic Note  [name] is a 53 y.o. female admitted on 12/11/2018. Patient was sent from podiatrist's office for worsening ulcer on left foot. Patient now with staph growing from bone biopsy culture. Pharmacy has been consulted for vancomycin and cefepime dosing.  Plan: Vancomycin 750mg  IV Q12hr. Will obtain peak and trough for 2300 dose.   Continue cefepime 2gm IV q8h  Height: 5' 10.51" (179.1 cm) Weight: 268 lb 10.1 oz (121.9 kg) IBW/kg (Calculated) : 69.68  Temp (24hrs), Avg:97.9 F (36.6 C), Min:97.7 F (36.5 C), Max:98.1 F (36.7 C)  Recent Labs  Lab 12/11/18 1604 12/12/18 0524 12/13/18 0353 12/14/18 0335 12/15/18 0437  WBC 12.2* 10.9*  --  10.3  --   CREATININE 1.02* 1.02* 1.02* 1.12* 0.92    Estimated Creatinine Clearance: 100 mL/min (by C-G formula based on SCr of 0.92 mg/dL).    No Known Allergies  Antimicrobials this admission: Vancomycin 8/11 >>  Cefepime 8/11 >>   Dose adjustments this admission: NA  Microbiology results: 8/11 BCx: no growth x 4 days  8/11 UCx: < 10K, insignificant growth  8/12 Wound cx: Moderate Staph Aureus with Mixed organisms.  8/13 knee abscess: reincubated for better growth 8/13 Bone cx: reincubated for better growth Thank you for allowing pharmacy to be a part of this patient's care.  Wilfrido Luedke L, RPh 12/15/2018 2:01 PM

## 2018-12-15 NOTE — Progress Notes (Signed)
[facility] NAME: [name]    MR#:  [id number]  DATE OF BIRTH:  Aug 06, 1965  SUBJECTIVE:  CHIEF COMPLAINT:  No chief complaint on file.  -s/p left heel wound debridement, clinically no osteomyelitis per podiatry -Supposed to go to rehab today, however micro lab called concerning for some positive cultures on the bone biopsy results. -Patient will stay through the weekend until final culture results are available.  REVIEW OF SYSTEMS:  Review of Systems  Constitutional: Positive for malaise/fatigue. Negative for chills and fever.  HENT: Negative for congestion, ear discharge, hearing loss and nosebleeds.   Eyes: Negative for blurred vision and double vision.  Respiratory: Negative for cough, shortness of breath and wheezing.   Cardiovascular: Negative for chest pain and palpitations.  Gastrointestinal: Negative for abdominal pain, constipation, diarrhea, nausea and vomiting.  Genitourinary: Negative for dysuria.  Musculoskeletal: Positive for joint pain and myalgias.  Neurological: Negative for dizziness, focal weakness, seizures, weakness and headaches.  Psychiatric/Behavioral: Negative for depression.    DRUG ALLERGIES:  No Known Allergies  VITALS:  Blood pressure (!) 163/81, pulse 93, temperature 97.7 F (36.5 C), temperature source Oral, resp. rate 19, height 5' 10.51" (1.791 m), weight 121.9 kg, SpO2 100 %.  PHYSICAL EXAMINATION:  Physical Exam   GENERAL:  53 y.o.-year-old obese patient lying in the bed with no acute distress.  EYES: Pupils equal, round, reactive to light and accommodation. No scleral icterus. Extraocular muscles intact.  HEENT: Head atraumatic, normocephalic. Oropharynx and nasopharynx clear.  NECK:  Supple, no jugular venous distention. No thyroid enlargement, no tenderness.  LUNGS: Normal breath sounds bilaterally, no wheezing, rales,rhonchi or crepitation. No use of accessory muscles of  respiration.  Decreased bibasilar breath sounds CARDIOVASCULAR: S1, S2 normal. No murmurs, rubs, or gallops.  ABDOMEN: Soft, nontender, nondistended. Bowel sounds present. No organomegaly or mass.  EXTREMITIES: No pedal edema, cyanosis, or clubbing.  Left heel in dressing and heavy boot NEUROLOGIC: Cranial nerves II through XII are intact. Muscle strength 5/5 in all extremities. Sensation intact. Gait not checked.  PSYCHIATRIC: The patient is alert and oriented x 3.  SKIN: No obvious rash, lesion, or ulcer.    LABORATORY PANEL:   CBC Recent Labs  Lab 12/14/18 0335  WBC 10.3  HGB 9.4*  HCT 30.8*  PLT 330   ------------------------------------------------------------------------------------------------------------------  Chemistries  Recent Labs  Lab 12/11/18 1604  12/15/18 0437  NA 128*   < > 134*  K 5.1   < > 4.8  CL 96*   < > 103  CO2 22   < > 24  GLUCOSE 105*   < > 215*  BUN 17   < > 16  CREATININE 1.02*   < > 0.92  CALCIUM 9.3   < > 9.4  AST 20  --   --   ALT 22  --   --   ALKPHOS 113  --   --   BILITOT 0.2*  --   --    < > = values in this interval not displayed.   ------------------------------------------------------------------------------------------------------------------  Cardiac Enzymes No results for input(s): TROPONINI in the last 168 hours. ------------------------------------------------------------------------------------------------------------------  RADIOLOGY:  Dg Foot 2 Views Left  Result Date: 12/13/2018 CLINICAL DATA:  Status post I and D with wound and bone debridement left heel. Antibiotic bead placement. EXAM: LEFT FOOT - 2 VIEW COMPARISON:  Preoperative radiograph 12/01/2018 FINDINGS: Placement of antibiotic beads abutting the posteroinferior calcaneus. No bony destructive change  by radiograph. Associated soft tissue edema/skin defect. Plantar calcaneal spur and Achilles tendon enthesophyte. Remainder the exam is unchanged. IMPRESSION:  Interval placement of antibiotic beads abutting the posteroinferior calcaneus. Electronically Signed   By: Keith Rake M.D.   On: 12/13/2018 20:41    EKG:   Orders placed or performed during the hospital encounter of 12/01/18  . ED EKG  . ED EKG  . EKG 12-Lead  . EKG 12-Lead  . EKG    ASSESSMENT AND PLAN:   53 year old female with past medical history significant for schizoaffective disorder, hypertension, diabetes, depression [place] to hospital secondary to worsening left heel ulceration.  1.  Osteomyelitis of left calcaneus-appreciate podiatry consult.  Status post wound debridement in OR yesterday. -No clinical evidence of osteomyelitis.   -Wound cultures from left leg growing mostly gram-negative rods and gram-positive cocci -Bone biopsy initial cultures were negative, however micro lab called stating that there are rare form of Staphylococcus aureus growing in the cultures.  Discussed with ID on call who has recommended IV antibiotics for now until final culture results are available. -We will need formal ID consult on Monday here. -Continue vancomycin and cefepime for now.  -Continue current dressing by podiatry until outpatient follow-up.  Nonweightbearing to touch toe weightbearing as tolerated with physical therapy. -Patient will need rehab at discharge.  2.  Diabetes mellitus-on Lantus and sliding scale  3.  Hyponatremia-improved with IV fluids  4.  Hypertension-monitor and add Norvasc.  Discontinue lisinopril due to consistently elevated potassium level  5.  Anxiety-on Paxil, Abilify and bupropion  6.  DVT prophylaxis- Lovenox  Physical therapy consulted, will need rehab at discharge. -Anticipate discharge Monday or Tuesday   All the records are reviewed and case discussed with Care Management/Social Workerr. Management plans discussed with the patient, family and they are in agreement.  CODE STATUS: Full Code  TOTAL TIME TAKING CARE OF THIS PATIENT: 37  minutes.   POSSIBLE D/C IN 2-3 DAYS, DEPENDING ON CLINICAL CONDITION.   Gladstone Lighter M.D on 12/15/2018 at 2:58 PM  Between 7am to 6pm - Pager - [phone]  After 6pm go to [email or web address] - password [email or web address] [facility]  Office  [phone]  CC: Primary care physician; [name], DO

## 2018-12-15 NOTE — Progress Notes (Signed)
      INFECTIOUS DISEASE ATTENDING ADDENDUM:   Date: 12/15/2018  Patient name: [name]  Medical record number: [id number]  Date of birth: [birth year]   I was called re Staph growing from bone biopsy culture  I would continue her current IV antibiotics  Marny Lowenstein from [facility] lab will workup the bone bx culture and other cultures that had mixed flora  I would get a formal ID consult on Monday as well    Alcide Evener 12/15/2018, 12:46 PM

## 2018-12-15 NOTE — Progress Notes (Signed)
PODIATRY / FOOT AND ANKLE SURGERY PROGRESS NOTE  Reason for consult: L medial heel ulceration, possible osteomyelitis  Chief Complaint: L heel wound   HPI: [name] is a 53 y.o. female who presents resting in bed status post 2 days left heel wound and bone debridement with bone biopsy.  Was notified by staff today that bone biopsy came back positive for rare organism.  Patient states that she would like to stay the weekend to see the culture results and get appropriate care with antibiotic therapy.  Patient denies any pain to the left lower extremity at this time.  Patient feels well overall.  PMHx:  Past Medical History:  Diagnosis Date  . Depression   . Diabetes mellitus without complication ([place])   . Hyperlipidemia   . Hypertension   . Neuromuscular disorder ([place])   . Schizo affective schizophrenia ([place])    abilify and pazil    Surgical Hx:  Past Surgical History:  Procedure Laterality Date  . CHOLECYSTECTOMY    . IRRIGATION AND DEBRIDEMENT FOOT Right 02/26/2016   Procedure: RIGHT 2ND TOE DEBRIDEMENT;  Surgeon: Samara Deist, DPM;  Location: [facility] ORS;  Service: Podiatry;  Laterality: Right;  . IRRIGATION AND DEBRIDEMENT FOOT Left 12/13/2018   Procedure: IRRIGATION AND DEBRIDEMENT FOOT;  Surgeon: Caroline More, DPM;  Location: [facility] ORS;  Service: Podiatry;  Laterality: Left;    FHx:  Family History  Problem Relation Age of Onset  . Breast cancer Cousin        maternal cousin  . Hypertension Mother   . Hyperthyroidism Mother   . Diabetes Maternal Grandmother   . Hypertension Maternal Grandmother   . Cancer Maternal Grandmother        unknown type  . Diabetes Maternal Grandfather   . Hypertension Maternal Grandfather   . Diabetes Paternal Grandmother   . Hypertension Paternal Grandmother   . Diabetes Paternal Grandfather   . Hypertension Paternal Grandfather     Social History:  reports that she quit smoking about 9 years ago. Her smoking use included  cigarettes. She has a 90.00 pack-year smoking history. She has quit using smokeless tobacco. She reports that she does not drink alcohol or use drugs.  Allergies: No Known Allergies  Review of Systems: General ROS: negative Respiratory ROS: no cough, shortness of breath, or wheezing Cardiovascular ROS: no chest pain or dyspnea on exertion Gastrointestinal ROS: no abdominal pain, change in bowel habits, or black or bloody stools Musculoskeletal ROS: positive for - joint pain Neurological ROS: positive for - numbness/tingling Dermatological ROS: positive for L heel incision  Medications Prior to Admission  Medication Sig Dispense Refill  . ARIPiprazole (ABILIFY) 10 MG tablet Take 10 mg by mouth daily.    [name] atorvastatin (LIPITOR) 40 MG tablet Take 40 mg by mouth daily.    [name] buPROPion (WELLBUTRIN XL) 150 MG 24 hr tablet Take 150 mg by mouth daily. Take in AM    . HUMALOG KWIKPEN 100 UNIT/ML KwikPen Inject 0.05 mLs (5 Units total) into the skin 3 (three) times daily with meals. + Sliding scale 15 mL 5  . Insulin Glargine (LANTUS SOLOSTAR) 100 UNIT/ML Solostar Pen Inject 50 Units into the skin daily. Adjust dose as advised (Patient taking differently: Inject 52 Units into the skin daily. ) 15 mL 1  . lisinopril (PRINIVIL,ZESTRIL) 20 MG tablet TAKE 1 TABLET(20 MG) BY MOUTH DAILY (Patient taking differently: Take 20 mg by mouth daily. ) 90 tablet 1  . metFORMIN (GLUCOPHAGE) 1000 MG tablet  TAKE 1 TABLET(1000 MG) BY MOUTH TWICE DAILY WITH A MEAL 180 tablet 1  . PARoxetine (PAXIL) 20 MG tablet Take 1 tablet (20 mg total) by mouth daily. Prescribed by [facility] Psychiat[facility] 30 tablet 0  . acetaminophen (TYLENOL) 325 MG tablet Take 2 tablets (650 mg total) by mouth every 6 (six) hours as needed for mild pain (or Fever >/= 101). (Patient not taking: Reported on 10/26/2018)    . amoxicillin-clavulanate (AUGMENTIN) 875-125 MG tablet Take 1 tablet by mouth 2 (two) times daily. For 10 day (Patient not taking: Reported  on 12/11/2018) 20 tablet 0  . B-D UF III MINI PEN NEEDLES 31G X 5 MM MISC USE AS DIRECTED 100 each 3  . Blood Glucose Monitoring Suppl (ONETOUCH VERIO) w/Device KIT Use to check blood sugar up to 3 x daily 1 kit 0  . Lancets (ONETOUCH ULTRASOFT) lancets Use to check blood sugar up to 3 x daily 100 each 12  . ONETOUCH VERIO test strip Check blood sugar up to 3 x daily as needed 100 each 11    Physical Exam: General: Alert and oriented.  No apparent distress.  Dressing left intact today to LLE.  Pt able to move toes, CFT intact to digits L.  Results for orders placed or performed during the hospital encounter of 12/11/18 (from the past 48 hour(s))  Glucose, capillary     Status: Abnormal   Collection Time: 12/13/18  4:32 PM  Result Value Ref Range   Glucose-Capillary 209 (H) 70 - 99 mg/dL  Glucose, capillary     Status: Abnormal   Collection Time: 12/13/18  9:14 PM  Result Value Ref Range   Glucose-Capillary 177 (H) 70 - 99 mg/dL  CBC     Status: Abnormal   Collection Time: 12/14/18  3:35 AM  Result Value Ref Range   WBC 10.3 4.0 - 10.5 K/uL   RBC 3.67 (L) 3.87 - 5.11 MIL/uL   Hemoglobin 9.4 (L) 12.0 - 15.0 g/dL   HCT 30.8 (L) 36.0 - 46.0 %   MCV 83.9 80.0 - 100.0 fL   MCH 25.6 (L) 26.0 - 34.0 pg   MCHC 30.5 30.0 - 36.0 g/dL   RDW 16.5 (H) 11.5 - 15.5 %   Platelets 330 150 - 400 K/uL   nRBC 0.0 0.0 - 0.2 %    Comment: Performed at [facility], [address] [place], [place] [address]  Basic metabolic panel     Status: Abnormal   Collection Time: 12/14/18  3:35 AM  Result Value Ref Range   Sodium 134 (L) 135 - 145 mmol/L   Potassium 5.0 3.5 - 5.1 mmol/L   Chloride 104 98 - 111 mmol/L   CO2 23 22 - 32 mmol/L   Glucose, Bld 214 (H) 70 - 99 mg/dL   BUN 15 6 - 20 mg/dL   Creatinine, Ser 1.12 (H) 0.44 - 1.00 mg/dL   Calcium 9.1 8.9 - 10.3 mg/dL   GFR calc non Af Amer 56 (L) >60 mL/min   GFR calc Af Amer >60 >60 mL/min   Anion gap 7 5 - 15    Comment: Performed at  [facility], [address] [place], [place] [address]  Sedimentation rate     Status: Abnormal   Collection Time: 12/14/18  3:35 AM  Result Value Ref Range   Sed Rate 92 (H) 0 - 30 mm/hr    Comment: Performed at [facility], [address], [place],  [address]  Hemoglobin A1c     Status: Abnormal   Collection Time: 12/14/18  3:53 AM  Result Value Ref Range   Hgb A1c MFr Bld 10.9 (H) 4.8 - 5.6 %    Comment: (NOTE) Pre diabetes:          5.7%-6.4% Diabetes:              >6.4% Glycemic control for   <7.0% adults with diabetes    Mean Plasma Glucose 266.13 mg/dL    Comment: Performed at [facility] [address] [place], [place] [address]  Glucose, capillary     Status: Abnormal   Collection Time: 12/14/18  7:35 AM  Result Value Ref Range   Glucose-Capillary 170 (H) 70 - 99 mg/dL  Glucose, capillary     Status: Abnormal   Collection Time: 12/14/18 11:41 AM  Result Value Ref Range   Glucose-Capillary 197 (H) 70 - 99 mg/dL  Glucose, capillary     Status: Abnormal   Collection Time: 12/14/18  4:42 PM  Result Value Ref Range   Glucose-Capillary 225 (H) 70 - 99 mg/dL  SARS Coronavirus 2 [facility] order, Performed in [facility] hospital lab) Nasopharyngeal Nasopharyngeal Swab     Status: None   Collection Time: 12/14/18  5:11 PM   Specimen: Nasopharyngeal Swab  Result Value Ref Range   SARS Coronavirus 2 NEGATIVE NEGATIVE    Comment: (NOTE) If result is NEGATIVE SARS-CoV-2 target nucleic acids are NOT DETECTED. The SARS-CoV-2 RNA is generally detectable in upper and lower  respiratory specimens during the acute phase of infection. The lowest  concentration of SARS-CoV-2 viral copies this assay can detect is 250  copies / mL. A negative result does not preclude SARS-CoV-2 infection  and should not be used as the sole basis for treatment or other  patient management decisions.  A negative result may occur with  improper specimen collection /  handling, submission of specimen other  than nasopharyngeal swab, presence of viral mutation(s) within the  areas targeted by this assay, and inadequate number of viral copies  (<250 copies / mL). A negative result must be combined with clinical  observations, patient history, and epidemiological information. If result is POSITIVE SARS-CoV-2 target nucleic acids are DETECTED. The SARS-CoV-2 RNA is generally detectable in upper and lower  respiratory specimens dur ing the acute phase of infection.  Positive  results are indicative of active infection with SARS-CoV-2.  Clinical  correlation with patient history and other diagnostic information is  necessary to determine patient infection status.  Positive results do  not rule out bacterial infection or co-infection with other viruses. If result is PRESUMPTIVE POSTIVE SARS-CoV-2 nucleic acids MAY BE PRESENT.   A presumptive positive result was obtained on the submitted specimen  and confirmed on repeat testing.  While 2019 novel coronavirus  (SARS-CoV-2) nucleic acids may be present in the submitted sample  additional confirmatory testing may be necessary for epidemiological  and / or clinical management purposes  to differentiate between  SARS-CoV-2 and other Sarbecovirus currently known to infect humans.  If clinically indicated additional testing with an alternate test  methodology [phone]) is advised. The SARS-CoV-2 RNA is generally  detectable in upper and lower respiratory sp ecimens during the acute  phase of infection. The expected result is Negative. Fact Sheet for Patients:  StrictlyIdeas.no Fact Sheet for Healthcare Providers: BankingDealers.co.za This test is not yet approved or cleared by the [place] FDA and has been authorized for detection and/or diagnosis  of SARS-CoV-2 by FDA under an Emergency Use Authorization (EUA).  This EUA will remain in effect (meaning this  test can be used) for the duration of the COVID-19 declaration under Section 564(b)(1) of the Act, 21 U.S.C. section 360bbb-3(b)(1), unless the authorization is terminated or revoked sooner. Performed at [facility], [address] [place], [place] [address]   Glucose, capillary     Status: Abnormal   Collection Time: 12/14/18  9:01 PM  Result Value Ref Range   Glucose-Capillary 217 (H) 70 - 99 mg/dL  Basic metabolic panel     Status: Abnormal   Collection Time: 12/15/18  4:37 AM  Result Value Ref Range   Sodium 134 (L) 135 - 145 mmol/L   Potassium 4.8 3.5 - 5.1 mmol/L   Chloride 103 98 - 111 mmol/L   CO2 24 22 - 32 mmol/L   Glucose, Bld 215 (H) 70 - 99 mg/dL   BUN 16 6 - 20 mg/dL   Creatinine, Ser 0.92 0.44 - 1.00 mg/dL   Calcium 9.4 8.9 - 10.3 mg/dL   GFR calc non Af Amer >60 >60 mL/min   GFR calc Af Amer >60 >60 mL/min   Anion gap 7 5 - 15    Comment: Performed at [facility], [address] [place], [place] [address]  Glucose, capillary     Status: Abnormal   Collection Time: 12/15/18  8:22 AM  Result Value Ref Range   Glucose-Capillary 194 (H) 70 - 99 mg/dL   Comment 1 Notify RN    Dg Foot 2 Views Left  Result Date: 12/13/2018 CLINICAL DATA:  Status post I and D with wound and bone debridement left heel. Antibiotic bead placement. EXAM: LEFT FOOT - 2 VIEW COMPARISON:  Preoperative radiograph 12/01/2018 FINDINGS: Placement of antibiotic beads abutting the posteroinferior calcaneus. No bony destructive change by radiograph. Associated soft tissue edema/skin defect. Plantar calcaneal spur and Achilles tendon enthesophyte. Remainder the exam is unchanged. IMPRESSION: Interval placement of antibiotic beads abutting the posteroinferior calcaneus. Electronically Signed   By: Keith Rake M.D.   On: 12/13/2018 20:41    Blood pressure (!) 163/81, pulse 93, temperature 97.7 F (36.5 C), temperature source Oral, resp. rate 19, height 5' 10.51" (1.791 m),  weight 121.9 kg, SpO2 100 %.   Assessment 1. 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 Cellulitis left foot 3. Uncontrolled diabetes type 2 with polyneuropathy  Plan -Discussed culture results with patient.  Wound culture is growing gram-negative rods and gram-positive cocci.  Bone culture was negative to date but was notified that it was growing rare staph aureus. -Discussed with infectious disease who would like to keep the patient over the weekend until the culture is final and will discuss antibiotic therapy further. -Visually during operative procedure did not appear that the bone was infected but the wound did probe to the calcaneus.  Bone debridement was performed and antibiotic beads were placed with vancomycin impregnated during procedure. -We will continue to monitor patient until discharge with the appropriate follow-up.  Will change dressing on Monday if patient is still in house. -Continue nonweightbearing to partial weightbearing of the left lower extremity all times.  Discharge instructions placed in chart.  Caroline More 12/15/2018, 3:37 PM

## 2018-12-15 NOTE — Progress Notes (Signed)
CRITICAL VALUE ALERT  Critical Value:  *Rare Staph Aureus  Date & Time Notied:  12/15/2018 10:15  Provider Notified: Neta Ehlers  Orders Received/Actions taken: wait for reply

## 2018-12-15 NOTE — Progress Notes (Signed)
[facility] from [facility] called with lab value from bone sample from left foot. Report stated that sample was growing a rare staph aureus.  MD notified.

## 2018-12-15 NOTE — TOC Progression Note (Signed)
Transition of Care [facility]) - Progression Note    Patient Details  Name: [name] MRN: [id number] Date of Birth: 12/02/1965  [facility] CM/SW Contact  Shelbie Hutching, RN Phone Number: 12/15/2018, 11:30 AM  Clinical Narrative:    Patient very anxious about going to [facility] healthcare after hearing on the news that patients have tested positive for COVID at the facility.  RNCM assured patient and patient's daughter [name] that all of the facilities are doing everything in their power to keep patient's safe.  The hospital and the SNF are frequently testing, providing PPE, and isolating to protect the patient's and staff.  Patient initially thought she may want to change her SNF choice but for now will stay with [facility] since all facilities have had positive patient's.  Daughter [name] is good with the current plan to discharge to [facility] as well.  RNCM will continue to follow, unsure if patient will discharge today, ID has just been consulted.    Expected Discharge Plan: Skilled Nursing Facility Barriers to Discharge: Continued Medical Work up  Expected Discharge Plan and Services Expected Discharge Plan: [place]   Discharge Planning Services: CM Consult Post Acute Care Choice: [place]                                         Social Determinants of Health (SDOH) Interventions    Readmission Risk Interventions Readmission Risk Prevention Plan 10/21/2018 10/18/2018 12/16/2017  Post Dischage Appt Complete - -  Medication Screening Complete - -  Transportation Screening Complete Complete Complete  PCP or Specialist Appt within 5-7 Days - - Complete  Home Care Screening - Complete Complete  Medication Review (RN CM) - Complete Complete  Some recent data might be hidden

## 2018-12-16 LAB — CULTURE, BLOOD (ROUTINE X 2)
Culture: NO GROWTH
Culture: NO GROWTH
Special Requests: ADEQUATE

## 2018-12-16 LAB — GLUCOSE, CAPILLARY
Glucose-Capillary: 176 mg/dL — ABNORMAL HIGH (ref 70–99)
Glucose-Capillary: 210 mg/dL — ABNORMAL HIGH (ref 70–99)
Glucose-Capillary: 200 mg/dL — ABNORMAL HIGH (ref 70–99)
Glucose-Capillary: 212 mg/dL — ABNORMAL HIGH (ref 70–99)

## 2018-12-16 LAB — VANCOMYCIN, TROUGH: Vancomycin Tr: 15 ug/mL (ref 15–20)

## 2018-12-16 LAB — VANCOMYCIN, PEAK: Vancomycin Pk: 23 ug/mL — ABNORMAL LOW (ref 30–40)

## 2018-12-16 LAB — CREATININE, SERUM
Creatinine, Ser: 0.95 mg/dL (ref 0.44–1.00)
GFR calc Af Amer: >60 mL/min (ref >60)
GFR calc non Af Amer: >60 mL/min (ref >60)

## 2018-12-16 NOTE — Progress Notes (Signed)
Pharmacy Antibiotic Note  [name] is a 53 y.o. female admitted on 12/11/2018. Patient was sent from podiatrist's office for worsening ulcer on left foot. Patient now with staph growing from bone biopsy culture. Pharmacy has been consulted for vancomycin and cefepime dosing. ID consult on Monday.   Plan: Continue vancomycin 750mg  IV Q12hr.  -Peak:23 -Trough: 15  -AUC: 472.2  Continue cefepime 2gm IV q8h  Height: 5\' 10"  (177.8 cm) Weight: 260 lb (117.9 kg) IBW/kg (Calculated) : 68.5  Temp (24hrs), Avg:98 F (36.7 C), Min:97.7 F (36.5 C), Max:98.3 F (36.8 C)  Recent Labs  Lab 12/11/18 1604 12/12/18 0524 12/13/18 0353 12/14/18 0335 12/15/18 0437 12/16/18 0056 12/16/18 1034  WBC 12.2* 10.9*  --  10.3  --   --   --   CREATININE 1.02* 1.02* 1.02* 1.12* 0.92 0.95  --   VANCOTROUGH  --   --   --   --   --   --  15  VANCOPEAK  --   --   --   --   --  23*  --     Estimated Creatinine Clearance: 94.4 mL/min (by C-G formula based on SCr of 0.95 mg/dL).    No Known Allergies  Antimicrobials this admission: Vancomycin 8/11 >>  Cefepime 8/11 >>   Dose adjustments this admission: NA  Microbiology results: 8/11 BCx: no growth   8/11 UCx: < 10K, insignificant growth  8/12 Wound cx: Moderate Staph Aureus with Mixed organisms.  8/13 knee abscess: Holding for possible anaerobe, moderate Morganella morganii, moderate Klebsiella oxytoca, few Viridans streptococcus  8/13 Bone cx: Rare Staphylococcus Aureus  Thank you for allowing pharmacy to be a part of this patient's care.  Simpson,Michael L, RPh 12/16/2018 2:28 PM

## 2018-12-16 NOTE — Progress Notes (Signed)
[place] [facility] [place] NAME: [name]    MR#:  [id number]  DATE OF BIRTH:  June 22, 1965  SUBJECTIVE:  CHIEF COMPLAINT:  No chief complaint on file.  -s/p left heel wound debridement, clinically no osteomyelitis per podiatry -Patient will stay through the weekend until final culture results are available.  REVIEW OF SYSTEMS:  Review of Systems  Constitutional: Positive for malaise/fatigue. Negative for chills and fever.  HENT: Negative for congestion, ear discharge, hearing loss and nosebleeds.   Eyes: Negative for blurred vision and double vision.  Respiratory: Negative for cough, shortness of breath and wheezing.   Cardiovascular: Negative for chest pain and palpitations.  Gastrointestinal: Negative for abdominal pain, constipation, diarrhea, nausea and vomiting.  Genitourinary: Negative for dysuria.  Musculoskeletal: Positive for joint pain and myalgias.  Neurological: Negative for dizziness, focal weakness, seizures, weakness and headaches.  Psychiatric/Behavioral: Negative for depression.    DRUG ALLERGIES:  No Known Allergies  VITALS:  Blood pressure 126/71, pulse 95, temperature 97.9 F (36.6 C), temperature source Oral, resp. rate 18, height 5\' 10"  (1.778 m), weight 117.9 kg, SpO2 96 %.  PHYSICAL EXAMINATION:  Physical Exam   GENERAL:  53 y.o.-year-old obese patient lying in the bed with no acute distress.  EYES: Pupils equal, round, reactive to light and accommodation. No scleral icterus. Extraocular muscles intact.  HEENT: Head atraumatic, normocephalic. Oropharynx and nasopharynx clear.  NECK:  Supple, no jugular venous distention. No thyroid enlargement, no tenderness.  LUNGS: Normal breath sounds bilaterally, no wheezing, rales,rhonchi or crepitation. No use of accessory muscles of respiration.  Decreased bibasilar breath sounds CARDIOVASCULAR: S1, S2 normal. No murmurs, rubs, or gallops.  ABDOMEN: Soft,  nontender, nondistended. Bowel sounds present. No organomegaly or mass.  EXTREMITIES: No pedal edema, cyanosis, or clubbing.  Left heel in dressing and heavy boot NEUROLOGIC: Cranial nerves II through XII are intact. Muscle strength 5/5 in all extremities. Sensation intact. Gait not checked.  PSYCHIATRIC: The patient is alert and oriented x 3.  SKIN: No obvious rash, lesion, or ulcer.    LABORATORY PANEL:   CBC Recent Labs  Lab 12/14/18 0335  WBC 10.3  HGB 9.4*  HCT 30.8*  PLT 330   ------------------------------------------------------------------------------------------------------------------  Chemistries  Recent Labs  Lab 12/11/18 1604  12/15/18 0437 12/16/18 0056  NA 128*   < > 134*  --   K 5.1   < > 4.8  --   CL 96*   < > 103  --   CO2 22   < > 24  --   GLUCOSE 105*   < > 215*  --   BUN 17   < > 16  --   CREATININE 1.02*   < > 0.92 0.95  CALCIUM 9.3   < > 9.4  --   AST 20  --   --   --   ALT 22  --   --   --   ALKPHOS 113  --   --   --   BILITOT 0.2*  --   --   --    < > = values in this interval not displayed.   ------------------------------------------------------------------------------------------------------------------  Cardiac Enzymes No results for input(s): TROPONINI in the last 168 hours. ------------------------------------------------------------------------------------------------------------------  RADIOLOGY:  No results found.  EKG:   Orders placed or performed during the hospital encounter of 12/01/18  . ED EKG  . ED EKG  . EKG 12-Lead  . EKG 12-Lead  . EKG  ASSESSMENT AND PLAN:   53 year old female with past medical history significant for schizoaffective disorder, hypertension, diabetes, depression [place] to hospital secondary to worsening left heel ulceration.  1.  Osteomyelitis of left calcaneus-appreciate podiatry consult.  Status post wound debridement in OR on 12/14/18 -No clinical evidence of osteomyelitis.   -Wound  cultures from left leg growing mostly gram-negative rods and gram-positive cocci -Bone biopsy initial cultures were negative, however micro lab called stating that there are rare form of Staphylococcus aureus growing in the cultures.  Discussed with ID on call who has recommended IV antibiotics for now until final culture results are available. -We will need formal ID consult on Monday with Dr Delaine Lame -Continue vancomycin and cefepime for now.  -Continue current dressing by podiatry until outpatient follow-up.  Nonweightbearing to touch toe weightbearing as tolerated with physical therapy. -Patient will need rehab at discharge. -?PICC  2.  Diabetes mellitus-on Lantus and sliding scale  3.  Hyponatremia-improved with IV fluids  4.  Hypertension-monitor and add Norvasc.  Discontinue lisinopril due to consistently elevated potassium level  5.  Anxiety-on Paxil, Abilify and bupropion  6.  DVT prophylaxis- Lovenox  Physical therapy consulted, will need rehab at discharge. -Anticipate discharge Monday or Tuesday   All the records are reviewed and case discussed with Care Management/Social Workerr. Management plans discussed with the patient and they are in agreement.  CODE STATUS: Full Code  TOTAL TIME TAKING CARE OF THIS PATIENT: 35  minutes.   POSSIBLE D/C IN 1-2 DAYS, DEPENDING ON CLINICAL CONDITION.   [name] M.D on 12/16/2018 at 2:11 PM  Between 7am to 6pm - Pager - [phone]  After 6pm go to [email or web address] - password [email or web address] [facility]  Office  [phone]  CC: Primary care physician; Gabriela Hauser, DO

## 2018-12-17 ENCOUNTER — Inpatient Hospital Stay: Payer: Self-pay

## 2018-12-17 DIAGNOSIS — I10 Essential (primary) hypertension: Secondary | ICD-10-CM | POA: Diagnosis not present

## 2018-12-17 DIAGNOSIS — F259 Schizoaffective disorder, unspecified: Secondary | ICD-10-CM

## 2018-12-17 DIAGNOSIS — Z79899 Other long term (current) drug therapy: Secondary | ICD-10-CM

## 2018-12-17 DIAGNOSIS — L97522 Non-pressure chronic ulcer of other part of left foot with fat layer exposed: Secondary | ICD-10-CM | POA: Diagnosis not present

## 2018-12-17 DIAGNOSIS — L97529 Non-pressure chronic ulcer of other part of left foot with unspecified severity: Secondary | ICD-10-CM | POA: Diagnosis not present

## 2018-12-17 DIAGNOSIS — F419 Anxiety disorder, unspecified: Secondary | ICD-10-CM | POA: Diagnosis not present

## 2018-12-17 DIAGNOSIS — L97422 Non-pressure chronic ulcer of left heel and midfoot with fat layer exposed: Secondary | ICD-10-CM | POA: Diagnosis not present

## 2018-12-17 DIAGNOSIS — R262 Difficulty in walking, not elsewhere classified: Secondary | ICD-10-CM | POA: Diagnosis not present

## 2018-12-17 DIAGNOSIS — M869 Osteomyelitis, unspecified: Secondary | ICD-10-CM

## 2018-12-17 DIAGNOSIS — Z48817 Encounter for surgical aftercare following surgery on the skin and subcutaneous tissue: Secondary | ICD-10-CM | POA: Diagnosis not present

## 2018-12-17 DIAGNOSIS — F2089 Other schizophrenia: Secondary | ICD-10-CM | POA: Diagnosis not present

## 2018-12-17 DIAGNOSIS — E1149 Type 2 diabetes mellitus with other diabetic neurological complication: Secondary | ICD-10-CM | POA: Diagnosis not present

## 2018-12-17 DIAGNOSIS — Z794 Long term (current) use of insulin: Secondary | ICD-10-CM

## 2018-12-17 DIAGNOSIS — M6281 Muscle weakness (generalized): Secondary | ICD-10-CM | POA: Diagnosis not present

## 2018-12-17 DIAGNOSIS — L859 Epidermal thickening, unspecified: Secondary | ICD-10-CM

## 2018-12-17 DIAGNOSIS — E785 Hyperlipidemia, unspecified: Secondary | ICD-10-CM | POA: Diagnosis not present

## 2018-12-17 DIAGNOSIS — K0889 Other specified disorders of teeth and supporting structures: Secondary | ICD-10-CM

## 2018-12-17 DIAGNOSIS — Z7401 Bed confinement status: Secondary | ICD-10-CM | POA: Diagnosis not present

## 2018-12-17 DIAGNOSIS — E1159 Type 2 diabetes mellitus with other circulatory complications: Secondary | ICD-10-CM | POA: Diagnosis not present

## 2018-12-17 DIAGNOSIS — M2550 Pain in unspecified joint: Secondary | ICD-10-CM | POA: Diagnosis not present

## 2018-12-17 DIAGNOSIS — G629 Polyneuropathy, unspecified: Secondary | ICD-10-CM | POA: Diagnosis not present

## 2018-12-17 DIAGNOSIS — B9561 Methicillin susceptible Staphylococcus aureus infection as the cause of diseases classified elsewhere: Secondary | ICD-10-CM | POA: Diagnosis not present

## 2018-12-17 DIAGNOSIS — M86172 Other acute osteomyelitis, left ankle and foot: Secondary | ICD-10-CM | POA: Diagnosis not present

## 2018-12-17 DIAGNOSIS — E1129 Type 2 diabetes mellitus with other diabetic kidney complication: Secondary | ICD-10-CM | POA: Diagnosis not present

## 2018-12-17 DIAGNOSIS — D649 Anemia, unspecified: Secondary | ICD-10-CM | POA: Diagnosis not present

## 2018-12-17 DIAGNOSIS — G709 Myoneural disorder, unspecified: Secondary | ICD-10-CM | POA: Diagnosis not present

## 2018-12-17 DIAGNOSIS — Z87891 Personal history of nicotine dependence: Secondary | ICD-10-CM

## 2018-12-17 DIAGNOSIS — L089 Local infection of the skin and subcutaneous tissue, unspecified: Secondary | ICD-10-CM | POA: Diagnosis not present

## 2018-12-17 DIAGNOSIS — E11628 Type 2 diabetes mellitus with other skin complications: Secondary | ICD-10-CM | POA: Diagnosis not present

## 2018-12-17 DIAGNOSIS — Z872 Personal history of diseases of the skin and subcutaneous tissue: Secondary | ICD-10-CM

## 2018-12-17 DIAGNOSIS — E1165 Type 2 diabetes mellitus with hyperglycemia: Secondary | ICD-10-CM | POA: Diagnosis not present

## 2018-12-17 DIAGNOSIS — E11621 Type 2 diabetes mellitus with foot ulcer: Secondary | ICD-10-CM | POA: Diagnosis not present

## 2018-12-17 DIAGNOSIS — B961 Klebsiella pneumoniae [K. pneumoniae] as the cause of diseases classified elsewhere: Secondary | ICD-10-CM

## 2018-12-17 DIAGNOSIS — L97429 Non-pressure chronic ulcer of left heel and midfoot with unspecified severity: Secondary | ICD-10-CM | POA: Diagnosis not present

## 2018-12-17 DIAGNOSIS — E1169 Type 2 diabetes mellitus with other specified complication: Principal | ICD-10-CM

## 2018-12-17 DIAGNOSIS — E1142 Type 2 diabetes mellitus with diabetic polyneuropathy: Secondary | ICD-10-CM

## 2018-12-17 DIAGNOSIS — F258 Other schizoaffective disorders: Secondary | ICD-10-CM | POA: Diagnosis not present

## 2018-12-17 DIAGNOSIS — B966 Bacteroides fragilis [B. fragilis] as the cause of diseases classified elsewhere: Secondary | ICD-10-CM | POA: Diagnosis not present

## 2018-12-17 DIAGNOSIS — L03116 Cellulitis of left lower limb: Secondary | ICD-10-CM | POA: Diagnosis not present

## 2018-12-17 DIAGNOSIS — E7849 Other hyperlipidemia: Secondary | ICD-10-CM | POA: Diagnosis not present

## 2018-12-17 DIAGNOSIS — B952 Enterococcus as the cause of diseases classified elsewhere: Secondary | ICD-10-CM | POA: Diagnosis not present

## 2018-12-17 DIAGNOSIS — G9009 Other idiopathic peripheral autonomic neuropathy: Secondary | ICD-10-CM | POA: Diagnosis not present

## 2018-12-17 DIAGNOSIS — F339 Major depressive disorder, recurrent, unspecified: Secondary | ICD-10-CM | POA: Diagnosis not present

## 2018-12-17 DIAGNOSIS — B964 Proteus (mirabilis) (morganii) as the cause of diseases classified elsewhere: Secondary | ICD-10-CM | POA: Diagnosis not present

## 2018-12-17 DIAGNOSIS — M86272 Subacute osteomyelitis, left ankle and foot: Secondary | ICD-10-CM | POA: Diagnosis not present

## 2018-12-17 DIAGNOSIS — B9689 Other specified bacterial agents as the cause of diseases classified elsewhere: Secondary | ICD-10-CM

## 2018-12-17 LAB — GLUCOSE, CAPILLARY
Glucose-Capillary: 226 mg/dL — ABNORMAL HIGH (ref 70–99)
Glucose-Capillary: 199 mg/dL — ABNORMAL HIGH (ref 70–99)
Glucose-Capillary: 203 mg/dL — ABNORMAL HIGH (ref 70–99)
Glucose-Capillary: 202 mg/dL — ABNORMAL HIGH (ref 70–99)

## 2018-12-17 LAB — CREATININE, SERUM
Creatinine, Ser: 0.86 mg/dL (ref 0.44–1.00)
GFR calc Af Amer: >60 mL/min (ref >60)
GFR calc non Af Amer: >60 mL/min (ref >60)

## 2018-12-17 LAB — ABO/RH: ABO/RH(D): O NEG

## 2018-12-17 LAB — SARS CORONAVIRUS 2 BY RT PCR (HOSPITAL ORDER, PERFORMED IN ~~LOC~~ HOSPITAL LAB): SARS Coronavirus 2: NEGATIVE

## 2018-12-17 MED ORDER — AMLODIPINE BESYLATE 5 MG PO TABS: 5.0000 mg | ORAL_TABLET | Freq: Every day | ORAL | 0 refills | Status: DC

## 2018-12-17 MED ORDER — BISACODYL 10 MG RE SUPP
10.0000 mg | Freq: Every day | RECTAL | Status: DC
  Administered 2018-12-17: 10 mg via RECTAL
  Filled 2018-12-17: qty 1

## 2018-12-17 MED ORDER — SODIUM CHLORIDE 0.9 % IV SOLN
2.0000 g | INTRAVENOUS | Status: DC
  Administered 2018-12-17: 2 g via INTRAVENOUS
  Filled 2018-12-17: qty 20
  Filled 2018-12-17: qty 2

## 2018-12-17 MED ORDER — METRONIDAZOLE 500 MG PO TABS
500.0000 mg | ORAL_TABLET | Freq: Three times a day (TID) | ORAL | Status: DC
  Administered 2018-12-17: 500 mg via ORAL
  Filled 2018-12-17: qty 1

## 2018-12-17 MED ORDER — LANTUS SOLOSTAR 100 UNIT/ML ~~LOC~~ SOPN: 40.0000 [IU] | PEN_INJECTOR | Freq: Every day | SUBCUTANEOUS | 1 refills | Status: DC

## 2018-12-17 MED ORDER — CEFTRIAXONE IV (FOR PTA / DISCHARGE USE ONLY): 2.0000 g | INTRAVENOUS | 0 refills | Status: AC

## 2018-12-17 MED ORDER — SODIUM CHLORIDE 0.9% FLUSH: 10.0000 mL | INTRAVENOUS | Status: DC | PRN

## 2018-12-17 MED ORDER — SODIUM CHLORIDE 0.9% FLUSH: 10.0000 mL | Freq: Two times a day (BID) | INTRAVENOUS | Status: DC

## 2018-12-17 MED ORDER — METRONIDAZOLE 500 MG PO TABS: 500.0000 mg | ORAL_TABLET | Freq: Three times a day (TID) | ORAL | 0 refills | Status: AC

## 2018-12-17 NOTE — Progress Notes (Signed)
Peripherally Inserted Central Catheter/Midline Placement  The IV Nurse has discussed with the patient and/or persons authorized to consent for the patient, the purpose of this procedure and the potential benefits and risks involved with this procedure.  The benefits include less needle sticks, lab draws from the catheter, and the patient may be discharged home with the catheter. Risks include, but not limited to, infection, bleeding, blood clot (thrombus formation), and puncture of an artery; nerve damage and irregular heartbeat and possibility to perform a PICC exchange if needed/ordered by physician.  Alternatives to this procedure were also discussed.  Bard Power PICC patient education guide, fact sheet on infection prevention and patient information card has been provided to patient /or left at bedside.    PICC/Midline Placement Documentation  PICC Single Lumen 12/17/18 PICC Right Cephalic 40 cm (Active)  Indication for Insertion or Continuance of Line Home intravenous therapies (PICC only) 12/17/18 1600  Exposed Catheter (cm) 0 cm 12/17/18 1600  Site Assessment Clean;Dry;Intact 12/17/18 1600  Line Status Flushed;Saline locked;Blood return noted 12/17/18 1600  Dressing Type Transparent;Securing device 12/17/18 1600  Dressing Status Clean;Dry;Intact;Antimicrobial disc in place 12/17/18 1600  Line Care Connections checked and tightened 12/17/18 1600  Dressing Intervention New dressing;Other (Comment) 12/17/18 1600  Dressing Change Due 12/24/18 12/17/18 1600       [name] 12/17/2018, 4:05 PM

## 2018-12-17 NOTE — Progress Notes (Addendum)
Inpatient Diabetes Program Recommendations  AACE/ADA: New Consensus Statement on Inpatient Glycemic Control (2015)  Target Ranges:  Prepandial:   less than 140 mg/dL      Peak postprandial:   less than 180 mg/dL (1-2 hours)      Critically ill patients:  140 - 180 mg/dL   Results for [name], [name] (MRN [id number]) as of 12/17/2018 09:48  Ref. Range 12/16/2018 09:06 12/16/2018 11:39 12/16/2018 16:44 12/16/2018 21:25  Glucose-Capillary Latest Ref Range: 70 - 99 mg/dL 176 (H)  3 units NOVOLOG +  30 units LANTUS  210 (H)  5 units NOVOLOG  200 (H)  3 units NOVOLOG  212 (H)  2 units NOVOLOG    Results for [name], [name] (MRN [id number]) as of 12/17/2018 09:48  Ref. Range 12/17/2018 07:35  Glucose-Capillary Latest Ref Range: 70 - 99 mg/dL 199 (H)  3 units NOVOLOG    Results for [name], [name] (MRN [id number]) as of 12/17/2018 09:48  Ref. Range 10/18/2018 04:48 12/14/2018 03:53  Hemoglobin A1C Latest Ref Range: 4.8 - 5.6 % 12.9 (H) 10.9 (H)  (266 mg/dl)     Admit with: L heel wound and calcaneal osteomyelitis  History: DM  Home DM Meds: Lantus 52 units Daily       Humalog 5 units TID       Humalog SSI       Metformin 1000 mg BID  Current Orders: Lantus 30 units Daily     Novolog Moderate Correction Scale/ SSI (0-15 units) TID AC + HS     Endocrinologist: [facility] Last seen by telemedicine visit: 08/01/2018 Was advised of the following: Continue metformin 1000 mg twice daily, Lantus 53 units each evening, Humalog 5 units with meals plus sliding scale beginning with 2 units for blood sugar 150-200, increasing by 2 units for every elevation of blood sugar 50 over 200 -Patient was advised of the importance of regular monitoring of blood sugars.  -She is asked to check her blood sugar before each meal and before bed.  -Patient is reminded to bring blood sugar log and/or meter to every visit   This DM Coordinator met with patient on 10/19/2018 during  last hospital stay to discuss elevated A1c and home glucose management. A1c has shown some improvement since June.    MD- Please consider the following in-hospital insulin adjustments:  1. Increase Lantus to 35 units Daily  2. Start Novolog Meal Coverage: Novolog 5 units TID with meals (home dose)  (Please add the following Hold Parameters: Hold if pt eats <50% of meal, Hold if pt NPO)      --Will follow patient during hospitalization--  Wyn Quaker RN, MSN, CDE Diabetes Coordinator Inpatient Glycemic Control Team Team Pager: [phone] (8a-5p)

## 2018-12-17 NOTE — TOC Progression Note (Signed)
Transition of Care [facility]) - Progression Note    Patient Details  Name: JAYLIN ROUNDY MRN: [id number] Date of Birth: 05/29/1965  Transition of Care [facility]) CM/SW [place], RN Phone Number: 12/17/2018, 11:31 AM  Clinical Narrative:    Awaiting ID to determine if the patient needs a PICC line for IV Vanc, I notified the physician that she will need a new Covid test to DC today, Auth is valid thru today to go to [facility] room 10 A  Report to be called to [phone] when ready   Expected Discharge Plan: [place] Barriers to Discharge: Continued Medical Work up  Expected Discharge Plan and Services Expected Discharge Plan: [place]   Discharge Planning Services: CM Consult Post Acute Care Choice: [place]                                         Social Determinants of Health (SDOH) Interventions    Readmission Risk Interventions Readmission Risk Prevention Plan 10/21/2018 10/18/2018 12/16/2017  Post Dischage Appt Complete - -  Medication Screening Complete - -  Transportation Screening Complete Complete Complete  PCP or Specialist Appt within 5-7 Days - - Complete  Home Care Screening - Complete Complete  Medication Review (RN CM) - Complete Complete  Some recent data might be hidden

## 2018-12-17 NOTE — Consult Note (Signed)
PHARMACY CONSULT NOTE FOR:  OUTPATIENT  PARENTERAL ANTIBIOTIC THERAPY (OPAT)  Diagnosis: Left heel wound with  osteomyelitis Has MSSA, morganella and kleb   OPAT Orders Discharge antibiotics: Ceftriaxone 2 grams Iv every 24 hours until 01/10/19  ( may discontinue earlier after her follow up appt with ID in 2 weeks)  Flagyl 500mg  PO every 8 hours until 12/31/18   IV antibiotic discharge orders are pended. To discharging provider:  please sign these orders via discharge navigator,  Select New Orders & click on the button choice - Manage This Unsigned Work.     Thank you for allowing pharmacy to be a part of this patient's care.  Rowland Lathe 12/17/2018, 12:27 PM

## 2018-12-17 NOTE — Progress Notes (Signed)
Physical Therapy Treatment Patient Details Name: [name] MRN: [id number] DOB: [birth year] Today's Date: 12/17/2018    History of Present Illness Patient is a pleasant 53 year old female admitted on 12/11/2018 after being sent from podiatrist's office for worsening diabetic ulcer on left foot (DM type II). Pt subsequently diagnosed with acute osteomyelitis of the left calcaneus. Underwent surgical debriedment on 8/13. PMH also includes BLE heel ulcers, depression, HLD, HTN, neuromuscular disorder, scho affective schizophrenia, peripheral neuropathy.    PT Comments    Pt is making good progress towards goals with ability to ambulate safely to recliner, maintaining correct WBing precautions. Post op shoe donned, however toes hang off on end, encouraged to discuss with MD for larger size if needed. Good endurance with there-ex, will continue to progress as able.   Follow Up Recommendations  SNF     Equipment Recommendations       Recommendations for Other Services       Precautions / Restrictions Precautions Precautions: Fall Precaution Comments: NWB, but orders are for very little weight as possible Required Braces or Orthoses: (surgical shoe) Other Brace: Post-op shoe on for transfers. Restrictions Weight Bearing Restrictions: Yes LLE Weight Bearing: (NWB and TWB)    Mobility  Bed Mobility Overal bed mobility: Modified Independent             General bed mobility comments: safe technique, no cues for sequencing  Transfers Overall transfer level: Needs assistance Equipment used: Rolling walker (2 wheeled) Transfers: Sit to/from Stand Sit to Stand: Min guard         General transfer comment: safe technique with shoe donned and upright posture. Able to maintain correct WBing precautions once standing  Ambulation/Gait Ambulation/Gait assistance: Min guard Gait Distance (Feet): 5 Feet Assistive device: Rolling walker (2 wheeled) Gait Pattern/deviations:  Step-to pattern     General Gait Details: ambulated to recliner with safe technique, no cues for sequencing.   Stairs             Wheelchair Mobility    Modified Rankin (Stroke Patients Only)       Balance Overall balance assessment: Needs assistance Sitting-balance support: Single extremity supported;Feet supported Sitting balance-Leahy Scale: Good     Standing balance support: Bilateral upper extremity supported Standing balance-Leahy Scale: Good                              Cognition Arousal/Alertness: Awake/alert Behavior During Therapy: WFL for tasks assessed/performed Overall Cognitive Status: Within Functional Limits for tasks assessed                                        Exercises Other Exercises Other Exercises: seated ther-ex performed including B LE LAQ, hip abd/add, SLRs, hip add squeezes x 15 reps with supervision    General Comments        Pertinent Vitals/Pain Pain Assessment: No/denies pain    Home Living                      Prior Function            PT Goals (current goals can now be found in the care plan section) Acute Rehab PT Goals Patient Stated Goal: to go home PT Goal Formulation: With patient Time For Goal Achievement: 12/28/18 Potential to Achieve Goals: Good Progress towards PT  goals: Progressing toward goals    Frequency    Min 2X/week      PT Plan Current plan remains appropriate    Co-evaluation              AM-PAC PT "6 Clicks" Mobility   Outcome Measure  Help needed turning from your back to your side while in a flat bed without using bedrails?: None Help needed moving from lying on your back to sitting on the side of a flat bed without using bedrails?: None Help needed moving to and from a bed to a chair (including a wheelchair)?: None Help needed standing up from a chair using your arms (e.g., wheelchair or bedside chair)?: None Help needed to walk in  hospital room?: A Little Help needed climbing 3-5 steps with a railing? : A Lot 6 Click Score: 21    End of Session Equipment Utilized During Treatment: Gait belt Activity Tolerance: Patient tolerated treatment well Patient left: in chair;with chair alarm set;with call bell/phone within reach(post op shoe in place) Nurse Communication: Mobility status PT Visit Diagnosis: Other abnormalities of gait and mobility (R26.89);Difficulty in walking, not elsewhere classified (R26.2);Pain Pain - Right/Left: Left Pain - part of body: Ankle and joints of foot     Time: 6283-1517 PT Time Calculation (min) (ACUTE ONLY): 23 min  Charges:  $Gait Training: 8-22 mins $Therapeutic Exercise: 8-22 mins                     Greggory Stallion, PT, DPT [phone]    [name] 12/17/2018, 11:16 AM

## 2018-12-17 NOTE — Progress Notes (Signed)
PODIATRY / FOOT AND ANKLE SURGERY PROGRESS NOTE  Reason for consult: L medial heel ulceration, possible osteomyelitis  Chief Complaint: L heel wound   HPI: [name] is a 53 y.o. female who presents resting in bed status post left heel wound and bone debridement with bone culture.  Patient states she is doing well overall and trying to stay off of the left foot is much as possible in a surgical shoe at all times.  Patient is resting in bed she is also using the Prevalon boots as instructed.  Patient denies any pain to the left heel at this time.  She was seen by infectious disease today and had her dressing changed earlier this morning.  Patient denies nausea, vomiting, fever, and chills.  PMHx:  Past Medical History:  Diagnosis Date  . Depression   . Diabetes mellitus without complication ([place])   . Hyperlipidemia   . Hypertension   . Neuromuscular disorder ([place])   . Schizo affective schizophrenia ([place])    abilify and pazil    Surgical Hx:  Past Surgical History:  Procedure Laterality Date  . CHOLECYSTECTOMY    . IRRIGATION AND DEBRIDEMENT FOOT Right 02/26/2016   Procedure: RIGHT 2ND TOE DEBRIDEMENT;  Surgeon: Samara Deist, DPM;  Location: [facility] ORS;  Service: Podiatry;  Laterality: Right;  . IRRIGATION AND DEBRIDEMENT FOOT Left 12/13/2018   Procedure: IRRIGATION AND DEBRIDEMENT FOOT;  Surgeon: Caroline More, DPM;  Location: [facility] ORS;  Service: Podiatry;  Laterality: Left;    FHx:  Family History  Problem Relation Age of Onset  . Breast cancer Cousin        maternal cousin  . Hypertension Mother   . Hyperthyroidism Mother   . Diabetes Maternal Grandmother   . Hypertension Maternal Grandmother   . Cancer Maternal Grandmother        unknown type  . Diabetes Maternal Grandfather   . Hypertension Maternal Grandfather   . Diabetes Paternal Grandmother   . Hypertension Paternal Grandmother   . Diabetes Paternal Grandfather   . Hypertension Paternal Grandfather      Social History:  reports that she quit smoking about 9 years ago. Her smoking use included cigarettes. She has a 90.00 pack-year smoking history. She has quit using smokeless tobacco. She reports that she does not drink alcohol or use drugs.  Allergies: No Known Allergies  Review of Systems: General ROS: negative Respiratory ROS: no cough, shortness of breath, or wheezing Cardiovascular ROS: no chest pain or dyspnea on exertion Gastrointestinal ROS: no abdominal pain, change in bowel habits, or black or bloody stools Musculoskeletal ROS: positive for - joint pain Neurological ROS: positive for - numbness/tingling Dermatological ROS: positive for L heel incision  Medications Prior to Admission  Medication Sig Dispense Refill  . ARIPiprazole (ABILIFY) 10 MG tablet Take 10 mg by mouth daily.    [name] atorvastatin (LIPITOR) 40 MG tablet Take 40 mg by mouth daily.    [name] buPROPion (WELLBUTRIN XL) 150 MG 24 hr tablet Take 150 mg by mouth daily. Take in AM    . HUMALOG KWIKPEN 100 UNIT/ML KwikPen Inject 0.05 mLs (5 Units total) into the skin 3 (three) times daily with meals. + Sliding scale 15 mL 5  . Insulin Glargine (LANTUS SOLOSTAR) 100 UNIT/ML Solostar Pen Inject 50 Units into the skin daily. Adjust dose as advised (Patient taking differently: Inject 52 Units into the skin daily. ) 15 mL 1  . lisinopril (PRINIVIL,ZESTRIL) 20 MG tablet TAKE 1 TABLET(20 MG) BY MOUTH  DAILY (Patient taking differently: Take 20 mg by mouth daily. ) 90 tablet 1  . metFORMIN (GLUCOPHAGE) 1000 MG tablet TAKE 1 TABLET(1000 MG) BY MOUTH TWICE DAILY WITH A MEAL 180 tablet 1  . PARoxetine (PAXIL) 20 MG tablet Take 1 tablet (20 mg total) by mouth daily. Prescribed by [facility] 30 tablet 0  . acetaminophen (TYLENOL) 325 MG tablet Take 2 tablets (650 mg total) by mouth every 6 (six) hours as needed for mild pain (or Fever >/= 101). (Patient not taking: Reported on 10/26/2018)    . amoxicillin-clavulanate (AUGMENTIN)  875-125 MG tablet Take 1 tablet by mouth 2 (two) times daily. For 10 day (Patient not taking: Reported on 12/11/2018) 20 tablet 0  . B-D UF III MINI PEN NEEDLES 31G X 5 MM MISC USE AS DIRECTED 100 each 3  . Blood Glucose Monitoring Suppl (ONETOUCH VERIO) w/Device KIT Use to check blood sugar up to 3 x daily 1 kit 0  . Lancets (ONETOUCH ULTRASOFT) lancets Use to check blood sugar up to 3 x daily 100 each 12  . ONETOUCH VERIO test strip Check blood sugar up to 3 x daily as needed 100 each 11    Physical Exam: General: Alert and oriented.  No apparent distress.  Dressing left intact today to LLE.  Pt able to move toes, CFT intact to digits L.  Results for orders placed or performed during the hospital encounter of 12/11/18 (from the past 48 hour(s))  Glucose, capillary     Status: Abnormal   Collection Time: 12/15/18  4:44 PM  Result Value Ref Range   Glucose-Capillary 170 (H) 70 - 99 mg/dL   Comment 1 Notify RN   Glucose, capillary     Status: Abnormal   Collection Time: 12/15/18  9:17 PM  Result Value Ref Range   Glucose-Capillary 240 (H) 70 - 99 mg/dL  Vancomycin, peak     Status: Abnormal   Collection Time: 12/16/18 12:56 AM  Result Value Ref Range   Vancomycin Pk 23 (L) 30 - 40 ug/mL    Comment: Performed at [facility], [address] [place], [place] [address]  Creatinine, serum     Status: None   Collection Time: 12/16/18 12:56 AM  Result Value Ref Range   Creatinine, Ser 0.95 0.44 - 1.00 mg/dL   GFR calc non Af Amer >60 >60 mL/min   GFR calc Af Amer >60 >60 mL/min    Comment: Performed at [facility], [address] [place], [place] [address]  Glucose, capillary     Status: Abnormal   Collection Time: 12/16/18  9:06 AM  Result Value Ref Range   Glucose-Capillary 176 (H) 70 - 99 mg/dL  Vancomycin, trough     Status: None   Collection Time: 12/16/18 10:34 AM  Result Value Ref Range   Vancomycin Tr 15 15 - 20 ug/mL    Comment: Performed at [facility], [address] [place], [place] [address]  Glucose, capillary     Status: Abnormal   Collection Time: 12/16/18 11:39 AM  Result Value Ref Range   Glucose-Capillary 210 (H) 70 - 99 mg/dL   Comment 1 Notify RN   Glucose, capillary     Status: Abnormal   Collection Time: 12/16/18  4:44 PM  Result Value Ref Range   Glucose-Capillary 200 (H) 70 - 99 mg/dL   Comment 1 Notify RN   Glucose, capillary     Status: Abnormal   Collection Time: 12/16/18  9:25 PM  Result Value Ref Range   Glucose-Capillary 212 (H) 70 - 99 mg/dL  Creatinine, serum     Status: None   Collection Time: 12/17/18  4:17 AM  Result Value Ref Range   Creatinine, Ser 0.86 0.44 - 1.00 mg/dL   GFR calc non Af Amer >60 >60 mL/min   GFR calc Af Amer >60 >60 mL/min    Comment: Performed at [facility], [address] [place], [place] [address]  Glucose, capillary     Status: Abnormal   Collection Time: 12/17/18  7:35 AM  Result Value Ref Range   Glucose-Capillary 199 (H) 70 - 99 mg/dL  Glucose, capillary     Status: Abnormal   Collection Time: 12/17/18 11:49 AM  Result Value Ref Range   Glucose-Capillary 203 (H) 70 - 99 mg/dL  ABO/Rh     Status: None   Collection Time: 12/17/18 12:33 PM  Result Value Ref Range   ABO/RH(D)      Jenetta Downer NEG Performed at [facility], [address], [place], [place] [address]    [place] Ekg Site Rite  Result Date: 12/17/2018 If [facility] image not attached, placement could not be confirmed due to current cardiac rhythm.   Blood pressure 120/75, pulse 85, temperature 97.9 F (36.6 C), temperature source Oral, resp. rate 20, height '5\' 10"'  (1.778 m), weight 117.9 kg, SpO2 98 %.   Assessment 1. 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 Cellulitis left foot 3. Uncontrolled diabetes type 2 with polyneuropathy  Plan -Discussed culture results with patient.  Wound culture is  growing gram-negative rods and gram-positive cocci.  Bone culture was negative to date but was notified that it was growing rare staph aureus.  Appreciate recommendations per infectious disease for antibiotic therapy.  Appears to be going on IV antibiotics for 4 weeks ceftriaxone and 2 weeks of Flagyl oral after. -Visually during operative procedure did not appear that the bone was infected but the wound did probe to the calcaneus.  Bone debridement was performed and antibiotic beads were placed with vancomycin impregnated during procedure. -Keep dressing clean, dry, and intact until postoperative visit #1 within 1 week of discharge date.  If dressing becomes saturated or loosened may change to the level of the incision site.  Apply Xeroform, 4 x 4 gauze, ABD, Kerlix, and Ace wrap with mild compression. -Continue nonweightbearing to partial weightbearing of the left lower extremity all times.  Podiatry team to sign off at this time.  Patient has follow-up instructions placed in chart and is to make a one-week postoperative visit appointment after discharge for wound reexamination.  Patient understands is agreeable.  Call with any concerns or questions.  Caroline More 12/17/2018, 1:40 PM

## 2018-12-17 NOTE — Progress Notes (Signed)
Pt alert and oriented. No complaints during the night.

## 2018-12-17 NOTE — Progress Notes (Signed)
Occupational Therapy Treatment Patient Details Name: [name] MRN: [id number] DOB: [birth year] Today's Date: 12/17/2018    History of present illness Patient is a pleasant 53 year old female admitted on 12/11/2018 after being sent from podiatrist's office for worsening diabetic ulcer on left foot (DM type II). Pt subsequently diagnosed with acute osteomyelitis of the left calcaneus. Underwent surgical debriedment on 8/13. PMH also includes BLE heel ulcers, depression, HLD, HTN, neuromuscular disorder, scho affective schizophrenia, peripheral neuropathy.   OT comments  [name] was seen for OT tx on this date. Pt received up in chair, pleasant and agreeable to participating in OT session. OT demonstrated use of AE to support LB ADL tasks (see ADL section below for additional details). Pt return demonstrated with min vc's for technique/safety. Was able to dress non-operative RLE on this date with good overall understanding and use of AE. Pt continues to be limited by NWB status through LLE. She endorses no pain, but states hx of neuropathy hinders functional mobility.  Pt making good progress toward goals. Pt continues to benefit from skilled OT services to maximize return to PLOF and minimize risk of future falls, injury, caregiver burden, and readmission. Will continue to follow POC. Discharge recommendation remains appropriate.    Follow Up Recommendations  SNF    Equipment Recommendations  3 in 1 bedside commode    Recommendations for Other Services      Precautions / Restrictions Precautions Precautions: Fall Precaution Comments: NWB, but orders are for very little weight as possible Required Braces or Orthoses: Other Brace(Surgical shoe) Other Brace: Post-op shoe on for transfers. Restrictions Weight Bearing Restrictions: Yes LLE Weight Bearing: (NWB in general TTWB OK during t/f with surgical shoe donned.) Other Position/Activity Restrictions: Per order, small amount of  LLE WB OK with post-op shoe on during functional transfers.       Mobility Bed Mobility Overal bed mobility: Modified Independent             General bed mobility comments: Deferred. Pt in recliner at start/end of session.  Transfers Overall transfer level: Needs assistance Equipment used: Rolling walker (2 wheeled) Transfers: Sit to/from Stand Sit to Stand: Min guard         General transfer comment: safe technique with shoe donned and upright posture. Able to maintain correct WBing precautions once standing    Balance Overall balance assessment: Needs assistance Sitting-balance support: Feet supported;No upper extremity supported(Rolled towel under LLE to promote adherence to NWB status.) Sitting balance-Leahy Scale: Good Sitting balance - Comments: Steady reaching within BOS. Good static and dynamic sitting balance. Able to weight shift when trialing AE for LB dressing.   Standing balance support: Bilateral upper extremity supported Standing balance-Leahy Scale: Good                             ADL either performed or assessed with clinical judgement   ADL Overall ADL's : Needs assistance/impaired                     Lower Body Dressing: Sit to/from stand;Set up;Minimal assistance;With adaptive equipment Lower Body Dressing Details (indicate cue type and reason): Pt educated in use of LHR and sock aid to support improved functional independence during LB dressing tasks. Pt able to return demonstrate with min VC for technique and increased time to attempt. Pt demonstrated good overall safety awareness, but OT provided education on importance of safety with LH R  when dressing RLE to ensure skin integrity and continued adherence to NWB through LLE.                     Vision Baseline Vision/History: Wears glasses Wears Glasses: Reading only Patient Visual Report: Blurring of vision     Perception     Praxis      Cognition  Arousal/Alertness: Awake/alert Behavior During Therapy: WFL for tasks assessed/performed Overall Cognitive Status: Within Functional Limits for tasks assessed                                          Exercises Exercises: Other exercises Other Exercises Other Exercises: seated ther-ex performed including B LE LAQ, hip abd/add, SLRs, hip add squeezes x 15 reps with supervision Other Exercises: Pt educated in AE for LB ADL tasks including safe use of the LH reacher and sock aid. Pt instructed in safe LB dressing strategies with considerations for skin integrity and NWB status through LLE.   Shoulder Instructions       General Comments      Pertinent Vitals/ Pain       Pain Assessment: No/denies pain  Home Living                                          Prior Functioning/Environment              Frequency  Min 1X/week        Progress Toward Goals  OT Goals(current goals can now be found in the care plan section)  Progress towards OT goals: Progressing toward goals  Acute Rehab OT Goals Patient Stated Goal: to go home OT Goal Formulation: With patient Time For Goal Achievement: 12/28/18 Potential to Achieve Goals: Good  Plan Discharge plan remains appropriate;Frequency remains appropriate    Co-evaluation                 AM-PAC OT "6 Clicks" Daily Activity     Outcome Measure   Help from another person eating meals?: A Little Help from another person taking care of personal grooming?: A Little Help from another person toileting, which includes using toliet, bedpan, or urinal?: A Lot Help from another person bathing (including washing, rinsing, drying)?: A Lot Help from another person to put on and taking off regular upper body clothing?: A Little Help from another person to put on and taking off regular lower body clothing?: A Little 6 Click Score: 16    End of Session Equipment Utilized During Treatment: Other  (comment)(Sock aid, long handle reacher, nail apron, RW)  OT Visit Diagnosis: Other abnormalities of gait and mobility (R26.89)   Activity Tolerance Patient tolerated treatment well   Patient Left in chair;with call bell/phone within reach;with chair alarm set;Other (comment)(B heels elevated on pillow.)   Nurse Communication          Time: 5456-2563 OT Time Calculation (min): 19 min  Charges: OT General Charges $OT Visit: 1 Visit OT Treatments $Self Care/Home Management : 8-22 mins  Shara Blazing, M.S., OTR/L Ascom: [phone] 12/17/18, 2:46 PM

## 2018-12-17 NOTE — Consult Note (Signed)
NAME: [name]  DOB: January 20, 1966  MRN: [id number]  Date/Time: 12/17/2018 10:50 AM  REQUESTING PROVIDER: Dr. Posey Pronto Subjective:  REASON FOR CONSULT: Left foot infection ? [name] is a 53 y.o. female with a history of uncontrolled diabetes, peripheral neuropathy, hyperlipidemia, hypertension, schizoaffective disorder presented from the podiatrist clinic on 12/11/2018 with worsening left heel ulcer concerning for osteomyelitis.   She was recently in the hospital in June 2020 for infection of bilateral heel ulcers.  She was initially treated with IV Vanco and cefepime and discharged on oral Augmentin to follow-up with podiatrist  In the ED her temperature was, blood pressure of 120/74 and heart rate of 90.  She had an MRI of the left foot. On 12/13/2018 she underwent wound debridement to the level of the bone, with 268% excision, application of antibiotic beads with vancomycin and bone biopsy for the left calcaneus.  As per Dr. Luana Shu the calcaneum bone was hard with no evidence of osteomyelitis. Wound cultures staph aureus. She is currently on vancomycin and cefepime and I am asked to see the patient.  Patient is not a very reliable historian.  She does say that she can get very anxious and disorganized with her underlying schizoid disorder. She lives with her mother who is 37 and has dementia. She says she has had thickened skin on her heel for many months.  She was followed by podiatrist.  It is only of recently that she has an ulcer on the left heel.  She denies trauma.  She does not have special shoes. She denies any fever or chills or cough or shortness of breath or night sweats. She sleeps in a recliner with legs in dependent position.  She does not wear special shoes to protect the foot.  She wears flip-flops she says.  She has 1 dog at home.  Past Medical History:  Diagnosis Date  . Depression   . Diabetes mellitus without complication ([place])   . Hyperlipidemia   .  Hypertension   . Neuromuscular disorder ([place])   . Schizo affective schizophrenia ([place])    abilify and pazil    Past Surgical History:  Procedure Laterality Date  . CHOLECYSTECTOMY    . IRRIGATION AND DEBRIDEMENT FOOT Right 02/26/2016   Procedure: RIGHT 2ND TOE DEBRIDEMENT;  Surgeon: Samara Deist, DPM;  Location: [facility] ORS;  Service: Podiatry;  Laterality: Right;  . IRRIGATION AND DEBRIDEMENT FOOT Left 12/13/2018   Procedure: IRRIGATION AND DEBRIDEMENT FOOT;  Surgeon: Caroline More, DPM;  Location: [facility] ORS;  Service: Podiatry;  Laterality: Left;    Social History   Socioeconomic History  . Marital status: Divorced    Spouse name: Not on file  . Number of children: 2  . Years of education: Not on file  . Highest education level: Not on file  Occupational History  . Not on file  Social Needs  . Financial resource strain: Not hard at all  . Food insecurity    Worry: Never true    Inability: Never true  . Transportation needs    Medical: No    Non-medical: No  Tobacco Use  . Smoking status: Former Smoker    Packs/day: 3.00    Years: 30.00    Pack years: 90.00    Types: Cigarettes    Quit date: 05/02/2009    Years since quitting: 9.6  . Smokeless tobacco: Former Systems developer  . Tobacco comment: Pt reported  quitting in 2011  Substance and Sexual Activity  . Alcohol use:  No  . Drug use: No  . Sexual activity: Not Currently  Lifestyle  . Physical activity    Days per week: 0 days    Minutes per session: 0 min  . Stress: Not at all  Relationships  . Social connections    Talks on phone: More than three times a week    Gets together: More than three times a week    Attends religious service: More than 4 times per year    Active member of club or organization: No    Attends meetings of clubs or organizations: Never    Relationship status: Divorced  . Intimate partner violence    Fear of current or ex partner: No    Emotionally abused: No    Physically abused: No    Forced  sexual activity: No  Other Topics Concern  . Not on file  Social History Narrative   At home with mom, 83. Lives in brothers house who moved out. Sister comes by every other day to check on patient and mom.    Family History  Problem Relation Age of Onset  . Breast cancer Cousin        maternal cousin  . Hypertension Mother   . Hyperthyroidism Mother   . Diabetes Maternal Grandmother   . Hypertension Maternal Grandmother   . Cancer Maternal Grandmother        unknown type  . Diabetes Maternal Grandfather   . Hypertension Maternal Grandfather   . Diabetes Paternal Grandmother   . Hypertension Paternal Grandmother   . Diabetes Paternal Grandfather   . Hypertension Paternal Grandfather    No Known Allergies  ? Current Facility-Administered Medications  Medication Dose Route Frequency Provider Last Rate Last Dose  . amLODipine (NORVASC) tablet 5 mg  5 mg Oral Daily Gladstone Lighter, MD   5 mg at 12/17/18 0747  . ARIPiprazole (ABILIFY) tablet 10 mg  10 mg Oral QHS Caroline More, DPM   10 mg at 12/17/18 0747  . atorvastatin (LIPITOR) tablet 40 mg  40 mg Oral Daily Caroline More, DPM   40 mg at 12/17/18 0747  . buPROPion (WELLBUTRIN XL) 24 hr tablet 150 mg  150 mg Oral Daily Caroline More, DPM   150 mg at 12/17/18 0746  . ceFEPIme (MAXIPIME) 2 g in sodium chloride 0.9 % 100 mL IVPB  2 g Intravenous Q8H Caroline More, DPM 200 mL/hr at 12/17/18 0502 2 g at 12/17/18 0502  . enoxaparin (LOVENOX) injection 40 mg  40 mg Subcutaneous Q24H Gladstone Lighter, MD   40 mg at 12/16/18 2137  . insulin aspart (novoLOG) injection 0-15 Units  0-15 Units Subcutaneous TID WC Caroline More, DPM   3 Units at 12/17/18 0745  . insulin aspart (novoLOG) injection 0-5 Units  0-5 Units Subcutaneous QHS Caroline More, DPM   2 Units at 12/16/18 2138  . insulin glargine (LANTUS) injection 30 Units  30 Units Subcutaneous Daily Gladstone Lighter, MD   30 Units at 12/17/18 1021  . PARoxetine (PAXIL) tablet 20 mg   20 mg Oral Daily Caroline More, DPM   20 mg at 12/16/18 2137  . vancomycin (VANCOCIN) IVPB 750 mg/150 ml premix  750 mg Intravenous Q12H Caroline More, DPM 150 mL/hr at 12/17/18 1020 750 mg at 12/17/18 1020     Abtx:  Anti-infectives (From admission, onward)   Start     Dose/Rate Route Frequency Ordered Stop   12/13/18 1229  vancomycin (VANCOCIN) powder  Status:  Discontinued  As needed 12/13/18 1229 12/13/18 1257   12/12/18 0600  vancomycin (VANCOCIN) IVPB 750 mg/150 ml premix     750 mg 150 mL/hr over 60 Minutes Intravenous Every 12 hours 12/11/18 1654     12/12/18 0200  ceFEPIme (MAXIPIME) 2 g in sodium chloride 0.9 % 100 mL IVPB     2 g 200 mL/hr over 30 Minutes Intravenous Every 8 hours 12/11/18 1654     12/11/18 1500  ceFEPIme (MAXIPIME) 2 g in sodium chloride 0.9 % 100 mL IVPB     2 g 200 mL/hr over 30 Minutes Intravenous  Once 12/11/18 1451 12/11/18 1859   12/11/18 1500  vancomycin (VANCOCIN) 2,500 mg in sodium chloride 0.9 % 500 mL IVPB     2,500 mg 250 mL/hr over 120 Minutes Intravenous  Once 12/11/18 1451 12/11/18 2133      REVIEW OF SYSTEMS:  Const: negative fever, negative chills, negative weight loss Eyes: negative diplopia or visual changes, negative eye pain ENT: negative coryza, negative sore throat Resp: negative cough, hemoptysis, dyspnea Cards: negative for chest pain, palpitations, lower extremity edema GU: negative for frequency, dysuria and hematuria GI: Negative for abdominal pain, diarrhea, bleeding, constipation Skin: negative for rash and pruritus Heme: negative for easy bruising and gum/nose bleeding MS: Has generalized weakness Neurolo:negative for headaches, dizziness, vertigo, memory problems.  She has numbness in her feet. Psych: negative for feelings of anxiety, depression  Endocrine: Says her diabetes is very poorly controlled. Allergy/Immunology- negative for any medication or food allergies ?  Objective:  VITALS:  BP 120/75 (BP  Location: Left Arm)   Pulse 85   Temp 97.9 F (36.6 C) (Oral)   Resp 20   Ht 5\' 10"  (1.778 m)   Wt 117.9 kg   SpO2 98%   BMI 37.31 kg/m  PHYSICAL EXAM:  General: Alert, cooperative, no distress, appears stated age.  Head: Normocephalic, without obvious abnormality, atraumatic. Eyes: Conjunctivae clear, anicteric sclerae. Pupils are equal ENT Nares normal. No drainage or sinus tenderness. Lips, mucosa, and tongue normal. No Thrush Broken teeth.  Poor dentition. Neck: Supple, symmetrical, no adenopathy, thyroid: non tender no carotid bruit and no JVD. Back: No CVA tenderness. Lungs: Clear to auscultation bilaterally. No Wheezing or Rhonchi. No rales. Heart: Regular rate and rhythm, no murmur, rub or gallop. Abdomen: Soft, non-tender,not distended. Bowel sounds normal. No masses Extremities: Bilateral dorsalis pedis pulse felt. Right heel has hyperkeratotic skin. Left heel surgical site approximated with sutures.  Little maceration of the skin.     Venous staining of the legs.   Skin: No rashes or lesions. Or bruising Lymph: Cervical, supraclavicular normal. Neurologic: Grossly non-focal Pertinent Labs Lab Results CBC    Component Value Date/Time   WBC 10.3 12/14/2018 0335   RBC 3.67 (L) 12/14/2018 0335   HGB 9.4 (L) 12/14/2018 0335   HGB 15.2 08/19/2012 1224   HCT 30.8 (L) 12/14/2018 0335   HCT 45.7 08/19/2012 1224   PLT 330 12/14/2018 0335   PLT 203 08/19/2012 1224   MCV 83.9 12/14/2018 0335   MCV 89 08/19/2012 1224   MCH 25.6 (L) 12/14/2018 0335   MCHC 30.5 12/14/2018 0335   RDW 16.5 (H) 12/14/2018 0335   RDW 16.4 (H) 08/19/2012 1224   LYMPHSABS 2.7 12/11/2018 1604   MONOABS 0.8 12/11/2018 1604   EOSABS 0.3 12/11/2018 1604   BASOSABS 0.1 12/11/2018 1604    CMP Latest Ref Rng & Units 12/17/2018 12/16/2018 12/15/2018  Glucose 70 - 99 mg/dL - - 215(H)  BUN 6 - 20 mg/dL - - 16  Creatinine 0.44 - 1.00 mg/dL 0.86 0.95 0.92  Sodium 135 - 145 mmol/L - - 134(L)   Potassium 3.5 - 5.1 mmol/L - - 4.8  Chloride 98 - 111 mmol/L - - 103  CO2 22 - 32 mmol/L - - 24  Calcium 8.9 - 10.3 mg/dL - - 9.4  Total Protein 6.5 - 8.1 g/dL - - -  Total Bilirubin 0.3 - 1.2 mg/dL - - -  Alkaline Phos 38 - 126 U/L - - -  AST 15 - 41 U/L - - -  ALT 0 - 44 U/L - - -      Microbiology: Recent Results (from the past 240 hour(s))  SARS Coronavirus 2 [facility] order, Performed in [place] hospital lab)     Status: None   Collection Time: 12/11/18  2:35 PM  Result Value Ref Range Status   SARS Coronavirus 2 NEGATIVE NEGATIVE Final    Comment: (NOTE) If result is NEGATIVE SARS-CoV-2 target nucleic acids are NOT DETECTED. The SARS-CoV-2 RNA is generally detectable in upper and lower  respiratory specimens during the acute phase of infection. The lowest  concentration of SARS-CoV-2 viral copies this assay can detect is 250  copies / mL. A negative result does not preclude SARS-CoV-2 infection  and should not be used as the sole basis for treatment or other  patient management decisions.  A negative result may occur with  improper specimen collection / handling, submission of specimen other  than nasopharyngeal swab, presence of viral mutation(s) within the  areas targeted by this assay, and inadequate number of viral copies  (<250 copies / mL). A negative result must be combined with clinical  observations, patient history, and epidemiological information. If result is POSITIVE SARS-CoV-2 target nucleic acids are DETECTED. The SARS-CoV-2 RNA is generally detectable in upper and lower  respiratory specimens dur ing the acute phase of infection.  Positive  results are indicative of active infection with SARS-CoV-2.  Clinical  correlation with patient history and other diagnostic information is  necessary to determine patient infection status.  Positive results do  not rule out bacterial infection or co-infection with other viruses. If result is PRESUMPTIVE POSTIVE  SARS-CoV-2 nucleic acids MAY BE PRESENT.   A presumptive positive result was obtained on the submitted specimen  and confirmed on repeat testing.  While 2019 novel coronavirus  (SARS-CoV-2) nucleic acids may be present in the submitted sample  additional confirmatory testing may be necessary for epidemiological  and / or clinical management purposes  to differentiate between  SARS-CoV-2 and other Sarbecovirus currently known to infect humans.  If clinically indicated additional testing with an alternate test  methodology [phone]) is advised. The SARS-CoV-2 RNA is generally  detectable in upper and lower respiratory sp ecimens during the acute  phase of infection. The expected result is Negative. Fact Sheet for Patients:  StrictlyIdeas.no Fact Sheet for Healthcare Providers: BankingDealers.co.za This test is not yet approved or cleared by the [place] FDA and has been authorized for detection and/or diagnosis of SARS-CoV-2 by FDA under an Emergency Use Authorization (EUA).  This EUA will remain in effect (meaning this test can be used) for the duration of the COVID-19 declaration under Section 564(b)(1) of the Act, 21 U.S.C. section 360bbb-3(b)(1), unless the authorization is terminated or revoked sooner. Performed at [facility], [place], [place] [address]   CULTURE, BLOOD (ROUTINE X 2) w Reflex to ID Panel  Status: None   Collection Time: 12/11/18  4:04 PM   Specimen: BLOOD  Result Value Ref Range Status   Specimen Description BLOOD LEFT ANTECUBITAL  Final   Special Requests   Final    BOTTLES DRAWN AEROBIC AND ANAEROBIC Blood Culture results may not be optimal due to an excessive volume of blood received in culture bottles   Culture   Final    NO GROWTH 5 DAYS Performed at [facility], [address] [place], [place] [address]    Report Status 12/16/2018 FINAL  Final  CULTURE, BLOOD  (ROUTINE X 2) w Reflex to ID Panel     Status: None   Collection Time: 12/11/18  4:15 PM   Specimen: BLOOD  Result Value Ref Range Status   Specimen Description BLOOD BLOOD RIGHT HAND  Final   Special Requests   Final    BOTTLES DRAWN AEROBIC AND ANAEROBIC Blood Culture adequate volume   Culture   Final    NO GROWTH 5 DAYS Performed at [facility], [address] [place], [place] [address]    Report Status 12/16/2018 FINAL  Final  Urine culture     Status: Abnormal   Collection Time: 12/11/18  8:04 PM   Specimen: Urine, Random  Result Value Ref Range Status   Specimen Description   Final    URINE, RANDOM Performed at [facility], [address], [place], [place] [address]    Special Requests   Final    NONE Performed at [facility], [address], [place], [place] [address]    Culture (A)  Final    <10,000 COLONIES/mL INSIGNIFICANT GROWTH Performed at [facility], [place] [address], [place], [place] [address]    Report Status 12/12/2018 FINAL  Final  MRSA PCR Screening     Status: None   Collection Time: 12/11/18  8:58 PM   Specimen: Nasal Mucosa; Nasopharyngeal  Result Value Ref Range Status   MRSA by PCR NEGATIVE NEGATIVE Final    Comment:        The GeneXpert MRSA Assay (FDA approved for NASAL specimens only), is one component of a comprehensive MRSA colonization surveillance program. It is not intended to diagnose MRSA infection nor to guide or monitor treatment for MRSA infections. Performed at [facility], [address] [place], [place] [address]   Aerobic Culture (superficial specimen)     Status: None   Collection Time: 12/12/18  5:06 PM   Specimen: Wound  Result Value Ref Range Status   Specimen Description   Final    WOUND Performed at [facility], [address] [place], [place] [address]    Special Requests   Final    Normal Performed at [place], [place] [address]    Gram Stain   Final    NO WBC SEEN ABUNDANT GRAM NEGATIVE RODS MODERATE GRAM POSITIVE COCCI MODERATE GRAM NEGATIVE COCCOBACILLI RARE GRAM POSITIVE RODS    Culture   Final    MODERATE STAPHYLOCOCCUS AUREUS WITHIN MIXED ORGANISMS Performed at [facility], [place] [address], [place], [place] [address]    Report Status 12/15/2018 FINAL  Final   Organism ID, Bacteria STAPHYLOCOCCUS AUREUS  Final      Susceptibility   Staphylococcus aureus - MIC*    CIPROFLOXACIN >=8 RESISTANT Resistant     ERYTHROMYCIN <=0.25 SENSITIVE Sensitive     GENTAMICIN <=0.5 SENSITIVE Sensitive     OXACILLIN <=  0.25 SENSITIVE Sensitive     TETRACYCLINE <=1 SENSITIVE Sensitive     VANCOMYCIN 1 SENSITIVE Sensitive     TRIMETH/SULFA <=10 SENSITIVE Sensitive     CLINDAMYCIN <=0.25 SENSITIVE Sensitive     RIFAMPIN <=0.5 SENSITIVE Sensitive     Inducible Clindamycin NEGATIVE Sensitive     * MODERATE STAPHYLOCOCCUS AUREUS  Aerobic/Anaerobic Culture (surgical/deep wound)     Status: None (Preliminary result)   Collection Time: 12/13/18 12:34 PM   Specimen: [facility] Other; Tissue  Result Value Ref Range Status   Specimen Description   Final    KNEE Performed at [facility], [address], [place], [place] [address]    Special Requests   Final    LEFT KNEE ABSCESS Performed at [facility], [address] [place], [place] [address]    Gram Stain   Final    NO WBC SEEN MODERATE GRAM POSITIVE COCCI RARE GRAM NEGATIVE RODS Performed at [facility], [place] [address], [place], [place] [address]    Culture   Final    HOLDING FOR POSSIBLE ANAEROBE MODERATE MORGANELLA MORGANII MODERATE KLEBSIELLA OXYTOCA FEW STREPTOCOCCUS MITIS/ORALIS    Report Status PENDING  Incomplete   Organism ID, Bacteria MORGANELLA MORGANII  Final   Organism ID, Bacteria KLEBSIELLA OXYTOCA  Final      Susceptibility   Klebsiella oxytoca - MIC*    AMPICILLIN >=32 RESISTANT Resistant      CEFAZOLIN 8 SENSITIVE Sensitive     CEFEPIME <=1 SENSITIVE Sensitive     CEFTAZIDIME <=1 SENSITIVE Sensitive     CEFTRIAXONE <=1 SENSITIVE Sensitive     CIPROFLOXACIN <=0.25 SENSITIVE Sensitive     GENTAMICIN <=1 SENSITIVE Sensitive     IMIPENEM <=0.25 SENSITIVE Sensitive     TRIMETH/SULFA <=20 SENSITIVE Sensitive     AMPICILLIN/SULBACTAM 16 INTERMEDIATE Intermediate     PIP/TAZO <=4 SENSITIVE Sensitive     Extended ESBL NEGATIVE Sensitive     * MODERATE KLEBSIELLA OXYTOCA   Morganella morganii - MIC*    AMPICILLIN >=32 RESISTANT Resistant     CEFAZOLIN >=64 RESISTANT Resistant     CEFEPIME <=1 SENSITIVE Sensitive     CEFTAZIDIME <=1 SENSITIVE Sensitive     CEFTRIAXONE <=1 SENSITIVE Sensitive     CIPROFLOXACIN <=0.25 SENSITIVE Sensitive     GENTAMICIN <=1 SENSITIVE Sensitive     IMIPENEM 4 SENSITIVE Sensitive     TRIMETH/SULFA <=20 SENSITIVE Sensitive     AMPICILLIN/SULBACTAM 16 INTERMEDIATE Intermediate     PIP/TAZO <=4 SENSITIVE Sensitive     * MODERATE MORGANELLA MORGANII  Aerobic/Anaerobic Culture (surgical/deep wound)     Status: None (Preliminary result)   Collection Time: 12/13/18 12:35 PM   Specimen: [facility] Other; Tissue  Result Value Ref Range Status   Specimen Description   Final    BONE Performed at [facility], [address], [place], [place] [address]    Special Requests   Final    LEFT CALCANEOUS BONE Performed at [facility], [address] [place], [place] [address]    Gram Stain NO WBC SEEN NO ORGANISMS SEEN   Final   Culture   Final    RARE STAPHYLOCOCCUS AUREUS CRITICAL RESULT CALLED TO, READ BACK BY AND VERIFIED WITH: Jackqulyn Livings RN, AT 1013 12/15/18 BY Rush Landmark Performed at [facility], [address], [place], [place] [address]    Report Status PENDING  Incomplete  SARS Coronavirus 2 [facility] order, Performed in [facility] hospital lab)  Nasopharyngeal Nasopharyngeal Swab     Status: None   Collection Time: 12/14/18   5:11 PM   Specimen: Nasopharyngeal Swab  Result Value Ref Range Status   SARS Coronavirus 2 NEGATIVE NEGATIVE Final    Comment: (NOTE) If result is NEGATIVE SARS-CoV-2 target nucleic acids are NOT DETECTED. The SARS-CoV-2 RNA is generally detectable in upper and lower  respiratory specimens during the acute phase of infection. The lowest  concentration of SARS-CoV-2 viral copies this assay can detect is 250  copies / mL. A negative result does not preclude SARS-CoV-2 infection  and should not be used as the sole basis for treatment or other  patient management decisions.  A negative result may occur with  improper specimen collection / handling, submission of specimen other  than nasopharyngeal swab, presence of viral mutation(s) within the  areas targeted by this assay, and inadequate number of viral copies  (<250 copies / mL). A negative result must be combined with clinical  observations, patient history, and epidemiological information. If result is POSITIVE SARS-CoV-2 target nucleic acids are DETECTED. The SARS-CoV-2 RNA is generally detectable in upper and lower  respiratory specimens dur ing the acute phase of infection.  Positive  results are indicative of active infection with SARS-CoV-2.  Clinical  correlation with patient history and other diagnostic information is  necessary to determine patient infection status.  Positive results do  not rule out bacterial infection or co-infection with other viruses. If result is PRESUMPTIVE POSTIVE SARS-CoV-2 nucleic acids MAY BE PRESENT.   A presumptive positive result was obtained on the submitted specimen  and confirmed on repeat testing.  While 2019 novel coronavirus  (SARS-CoV-2) nucleic acids may be present in the submitted sample  additional confirmatory testing may be necessary for epidemiological  and / or clinical management purposes  to differentiate between  SARS-CoV-2 and other Sarbecovirus currently known to infect  humans.  If clinically indicated additional testing with an alternate test  methodology [phone]) is advised. The SARS-CoV-2 RNA is generally  detectable in upper and lower respiratory sp ecimens during the acute  phase of infection. The expected result is Negative. Fact Sheet for Patients:  StrictlyIdeas.no Fact Sheet for Healthcare Providers: BankingDealers.co.za This test is not yet approved or cleared by the [place] FDA and has been authorized for detection and/or diagnosis of SARS-CoV-2 by FDA under an Emergency Use Authorization (EUA).  This EUA will remain in effect (meaning this test can be used) for the duration of the COVID-19 declaration under Section 564(b)(1) of the Act, 21 U.S.C. section 360bbb-3(b)(1), unless the authorization is terminated or revoked sooner. Performed at [facility], [address] [place], [place] [address]     IMAGING RESULTS: Open wound on the plantar and medial aspect of the hindfoot with associated significant diffuse cellulitis and myositis. No definite discrete drainable soft tissue abscess or pyomyositis. 2. T2 signal abnormality in the posterior and inferior calcaneus is suspicious for early osteomyelitis. I have personally reviewed the films ? Impression/Recommendation 53 y.o. female with a history of uncontrolled diabetes, peripheral neuropathy, hyperlipidemia, hypertension, schizoaffective disorder presented from the podiatrist clinic on 12/11/2018 with worsening left heel ulcer concerning for osteomyelitis.  ? 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 .  Status post debridement.  A bone culture was sent and that is growing staph aureus.  The wound culture was positive for Klebsiella and Morganella as well.  During surgery the bone  was hard and only culture was sent and not biopsy. Because of  concern for osteomyelitis we will treat her with IV antibiotics.  IV ceftriaxone will cover all the above 3 organisms.  We will give IV ceftriaxone for 4 weeks.  We will also do oral Flagyl for 2 weeks.  We will also follow sed rate.  Diabetes mellitus poorly controlled.  On insulin ? ?Schizophrenia on Abilify, Wellbutrin and Paxil.  Hypertension on amlodipine  Hyperlipidemia on atorvastatin. ___________________________________________________ Discussed with patient and Dr. Posey Pronto. Note:  This document was prepared using Dragon voice recognition software and may include unintentional dictation errors.

## 2018-12-17 NOTE — TOC Progression Note (Signed)
Transition of Care [facility]) - Progression Note    Patient Details  Name: [name] MRN: [id number] Date of Birth: [birth year]  Transition of Care [facility]) CM/SW Contact  Su Hilt, RN Phone Number: 12/17/2018, 9:09 AM  Clinical Narrative:     Barbette Or with PASSR and left a VM for a return call [phone]  Expected Discharge Plan: [place] Barriers to Discharge: Continued Medical Work up  Expected Discharge Plan and Services Expected Discharge Plan: [place]   Discharge Planning Services: CM Consult Post Acute Care Choice: [place]                                         Social Determinants of Health (SDOH) Interventions    Readmission Risk Interventions Readmission Risk Prevention Plan 10/21/2018 10/18/2018 12/16/2017  Post Dischage Appt Complete - -  Medication Screening Complete - -  Transportation Screening Complete Complete Complete  PCP or Specialist Appt within 5-7 Days - - Complete  Home Care Screening - Complete Complete  Medication Review (RN CM) - Complete Complete  Some recent data might be hidden

## 2018-12-17 NOTE — Discharge Summary (Signed)
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 Depression   . Diabetes mellitus without complication ([place])   . Hyperlipidemia   . Hypertension   . Neuromuscular disorder ([place])   . Schizo affective schizophrenia ([place])    abilify and pazil    HOSPITAL COURSE:   53 year old female with past medical history significant for schizoaffective disorder, hypertension, diabetes, depression [place] to hospital secondary to worsening left heel ulceration.  1.  Osteomyelitis of left calcaneus-appreciate podiatry consult.  Status post wound debridement in OR on 12/14/18 -No clinical evidence of osteomyelitis.   -Wound cultures from left leg growing mostly gram-negative rods and gram-positive cocci -Bone biopsy initial cultures were negative, however micro lab called stating that there are rare form of Staphylococcus aureus growing in the cultures.   - ID consult  with Dr Delaine Lame-- recommends IV Rocephin and and PO Flagyl. Patient will get PICC line place before she goes to the rehab facility today -Continue current dressing by podiatry until outpatient follow-up.  Nonweightbearing to touch toe weightbearing as tolerated with physical therapy. -Patient will need rehab at discharge.  2.  Diabetes mellitus-on Lantus and sliding scale  3.  Hyponatremia-improved with IV fluids  4.  Hypertension-monitor and add Norvasc.  Discontinue lisinopril due to consistently elevated potassium level  5.  Anxiety-on Paxil, Abilify and bupropion  6.  DVT  prophylaxis- Lovenox  Discharge patient to rehab after PICC line is placed.pt is agreeable with the plan  CONSULTS OBTAINED:  Treatment Team:  Tsosie Billing, MD  DRUG ALLERGIES:  No Known Allergies  DISCHARGE MEDICATIONS:   Allergies as of 12/17/2018   No Known Allergies     Medication List    STOP taking these medications   acetaminophen 325 MG tablet Commonly known as: TYLENOL   amoxicillin-clavulanate 875-125 MG tablet Commonly known as: AUGMENTIN     TAKE these medications   amLODipine 5 MG tablet Commonly known as: NORVASC Take 1 tablet (5 mg total) by mouth daily.   ARIPiprazole 10 MG tablet Commonly known as: ABILIFY Take 10 mg by mouth daily.   atorvastatin 40 MG tablet Commonly known as: LIPITOR Take 40 mg by mouth daily.   B-D UF III MINI PEN NEEDLES 31G X 5 MM Misc Generic drug: Insulin Pen Needle USE AS DIRECTED   buPROPion 150 MG 24 hr tablet Commonly known as: WELLBUTRIN XL Take 150 mg by mouth daily. Take in AM   cefTRIAXone  IVPB Commonly known as: ROCEPHIN Inject 2 g into the vein daily for 24 days. Indication:  Left heel wound with  osteomyelitis Last Day of Therapy:  01/10/2019 Labs - Once weekly:  CBC/D, CMP, and ESR Please pull PIC at completion of IV antibiotics   HumaLOG KwikPen 100 UNIT/ML KwikPen Generic drug: insulin lispro Inject 0.05 mLs (5 Units total) into the skin 3 (three) times daily with meals. + Sliding scale   Lantus SoloStar 100 UNIT/ML Solostar Pen Generic drug: Insulin Glargine Inject 40 Units into the skin daily. Adjust dose as advised What changed: how much to  take   lisinopril 20 MG tablet Commonly known as: ZESTRIL TAKE 1 TABLET(20 MG) BY MOUTH DAILY What changed: See the new instructions.   metFORMIN 1000 MG tablet Commonly known as: GLUCOPHAGE TAKE 1 TABLET(1000 MG) BY MOUTH TWICE DAILY WITH A MEAL   metroNIDAZOLE 500 MG tablet Commonly known as: Flagyl Take 1 tablet (500 mg total) by  mouth every 8 (eight) hours for 14 days.   onetouch ultrasoft lancets Use to check blood sugar up to 3 x daily   OneTouch Verio test strip Generic drug: glucose blood Check blood sugar up to 3 x daily as needed   OneTouch Verio w/Device Kit Use to check blood sugar up to 3 x daily   PARoxetine 20 MG tablet Commonly known as: PAXIL Take 1 tablet (20 mg total) by mouth daily. Prescribed by [facility] Psychiatry            Home Infusion Instuctions  (From admission, onward)         Start     Ordered   12/17/18 0000  Home infusion instructions [facility] May follow [place] Dosing Protocol; May administer Cathflo as needed to maintain patency of vascular access device.; [place]ushing of vascular access device: per [facility] Protocol: 0.9% NaCl pre/post medica...    Question Answer Comment  Instructions May follow [place] Dosing Protocol   Instructions May administer Cathflo as needed to maintain patency of vascular access device.   Instructions [place]ushing of vascular access device: per [facility] Protocol: 0.9% NaCl pre/post medication administration and prn patency; Heparin 100 u/ml, 24m for implanted ports and Heparin 10u/ml, 557mfor all other central venous catheters.   Instructions May follow [facility] Anaphylaxis Protocol for First Dose Administration in the home: 0.9% NaCl at 25-50 ml/hr to maintain IV access for protocol meds. Epinephrine 0.3 ml IV/IM PRN and Benadryl 25-50 IV/IM PRN s/s of anaphylaxis.   Instructions [facility] Infusion Coordinator (RN) to assist per patient IV care needs in the home PRN.      12/17/18 1500          If you experience worsening of your admission symptoms, develop shortness of breath, life threatening emergency, suicidal or homicidal thoughts you must seek medical attention immediately by calling 911 or calling your MD immediately  if symptoms less severe.  You Must read complete instructions/literature along with all the possible adverse  reactions/side effects for all the Medicines you take and that have been prescribed to you. Take any new Medicines after you have completely understood and accept all the possible adverse reactions/side effects.   Please note  You were cared for by a hospitalist during your hospital stay. If you have any questions about your discharge medications or the care you received while you were in the hospital after you are discharged, you can call the unit and asked to speak with the hospitalist on call if the hospitalist that took care of you is not available. Once you are discharged, your primary care physician will handle any further medical issues. Please note that NO REFILLS for any discharge medications will be authorized once you are discharged, as it is imperative that you return to your primary care physician (or establish a relationship with a primary care physician if you do not have one) for your aftercare needs so that they can reassess your need for medications and monitor your lab values. Today   SUBJECTIVE   No new complaints  VITAL SIGNS:  Blood pressure 120/75, pulse 85,  temperature 97.9 F (36.6 C), temperature source Oral, resp. rate 20, height '5\' 10"'  (1.778 m), weight 117.9 kg, SpO2 98 %.  I/O:    Intake/Output Summary (Last 24 hours) at 12/17/2018 1501 Last data filed at 12/17/2018 1330 Gross per 24 hour  Intake 720 ml  Output -  Net 720 ml    PHYSICAL EXAMINATION:  GENERAL:  53 y.o.-year-old patient lying in the bed with no acute distress.  EYES: Pupils equal, round, reactive to light and accommodation. No scleral icterus. Extraocular muscles intact.  HEENT: Head atraumatic, normocephalic. Oropharynx and nasopharynx clear.  NECK:  Supple, no jugular venous distention. No thyroid enlargement, no tenderness.  LUNGS: Normal breath sounds bilaterally, no wheezing, rales,rhonchi or crepitation. No use of accessory muscles of respiration.  CARDIOVASCULAR: S1, S2 normal. No  murmurs, rubs, or gallops.  ABDOMEN: Soft, non-tender, non-distended. Bowel sounds present. No organomegaly or mass.  EXTREMITIES: left lower extremity surgical dressing present NEUROLOGIC: Cranial nerves II through XII are intact. Muscle strength 5/5 in all extremities. Sensation intact. Gait not checked.  PSYCHIATRIC: The patient is alert and oriented x 3.  SKIN: No obvious rash, lesion, or ulcer.   DATA REVIEW:   CBC  Recent Labs  Lab 12/14/18 0335  WBC 10.3  HGB 9.4*  HCT 30.8*  PLT 330    Chemistries  Recent Labs  Lab 12/11/18 1604  12/15/18 0437  12/17/18 0417  NA 128*   < > 134*  --   --   K 5.1   < > 4.8  --   --   CL 96*   < > 103  --   --   CO2 22   < > 24  --   --   GLUCOSE 105*   < > 215*  --   --   BUN 17   < > 16  --   --   CREATININE 1.02*   < > 0.92   < > 0.86  CALCIUM 9.3   < > 9.4  --   --   AST 20  --   --   --   --   ALT 22  --   --   --   --   ALKPHOS 113  --   --   --   --   BILITOT 0.2*  --   --   --   --    < > = values in this interval not displayed.    Microbiology Results   Recent Results (from the past 240 hour(s))  SARS Coronavirus 2 [facility] order, Performed in [facility] hospital lab)     Status: None   Collection Time: 12/11/18  2:35 PM  Result Value Ref Range Status   SARS Coronavirus 2 NEGATIVE NEGATIVE Final    Comment: (NOTE) If result is NEGATIVE SARS-CoV-2 target nucleic acids are NOT DETECTED. The SARS-CoV-2 RNA is generally detectable in upper and lower  respiratory specimens during the acute phase of infection. The lowest  concentration of SARS-CoV-2 viral copies this assay can detect is 250  copies / mL. A negative result does not preclude SARS-CoV-2 infection  and should not be used as the sole basis for treatment or other  patient management decisions.  A negative result may occur with  improper specimen collection / handling, submission of specimen other  than nasopharyngeal swab, presence of viral mutation(s)  within the  areas targeted by this assay, and inadequate number of viral copies  (<250 copies /  mL). A negative result must be combined with clinical  observations, patient history, and epidemiological information. If result is POSITIVE SARS-CoV-2 target nucleic acids are DETECTED. The SARS-CoV-2 RNA is generally detectable in upper and lower  respiratory specimens dur ing the acute phase of infection.  Positive  results are indicative of active infection with SARS-CoV-2.  Clinical  correlation with patient history and other diagnostic information is  necessary to determine patient infection status.  Positive results do  not rule out bacterial infection or co-infection with other viruses. If result is PRESUMPTIVE POSTIVE SARS-CoV-2 nucleic acids MAY BE PRESENT.   A presumptive positive result was obtained on the submitted specimen  and confirmed on repeat testing.  While 2019 novel coronavirus  (SARS-CoV-2) nucleic acids may be present in the submitted sample  additional confirmatory testing may be necessary for epidemiological  and / or clinical management purposes  to differentiate between  SARS-CoV-2 and other Sarbecovirus currently known to infect humans.  If clinically indicated additional testing with an alternate test  methodology [phone]) is advised. The SARS-CoV-2 RNA is generally  detectable in upper and lower respiratory sp ecimens during the acute  phase of infection. The expected result is Negative. Fact Sheet for Patients:  StrictlyIdeas.no Fact Sheet for Healthcare Providers: BankingDealers.co.za This test is not yet approved or cleared by the [place] FDA and has been authorized for detection and/or diagnosis of SARS-CoV-2 by FDA under an Emergency Use Authorization (EUA).  This EUA will remain in effect (meaning this test can be used) for the duration of the COVID-19 declaration under Section 564(b)(1) of the Act,  21 U.S.C. section 360bbb-3(b)(1), unless the authorization is terminated or revoked sooner. Performed at [facility], [address] [place], [place] [address]   CULTURE, BLOOD (ROUTINE X 2) w Reflex to ID Panel     Status: None   Collection Time: 12/11/18  4:04 PM   Specimen: BLOOD  Result Value Ref Range Status   Specimen Description BLOOD LEFT ANTECUBITAL  Final   Special Requests   Final    BOTTLES DRAWN AEROBIC AND ANAEROBIC Blood Culture results may not be optimal due to an excessive volume of blood received in culture bottles   Culture   Final    NO GROWTH 5 DAYS Performed at [facility], [address] [place], [place] [address]    Report Status 12/16/2018 FINAL  Final  CULTURE, BLOOD (ROUTINE X 2) w Reflex to ID Panel     Status: None   Collection Time: 12/11/18  4:15 PM   Specimen: BLOOD  Result Value Ref Range Status   Specimen Description BLOOD BLOOD RIGHT HAND  Final   Special Requests   Final    BOTTLES DRAWN AEROBIC AND ANAEROBIC Blood Culture adequate volume   Culture   Final    NO GROWTH 5 DAYS Performed at [facility], [address], [place], [place] [address]    Report Status 12/16/2018 FINAL  Final  Urine culture     Status: Abnormal   Collection Time: 12/11/18  8:04 PM   Specimen: Urine, Random  Result Value Ref Range Status   Specimen Description   Final    URINE, RANDOM Performed at [facility], [address] [place], [place] [address]    Special Requests   Final    NONE Performed at [facility], [address] [place], [place] [address]    Culture (A)  Final    <10,000 COLONIES/mL INSIGNIFICANT  GROWTH Performed at [facility], [place] [address], [place], [place] [address]    Report Status 12/12/2018 FINAL  Final  MRSA PCR Screening     Status: None   Collection Time: 12/11/18  8:58 PM   Specimen: Nasal Mucosa; Nasopharyngeal  Result Value Ref Range Status   MRSA by PCR NEGATIVE  NEGATIVE Final    Comment:        The GeneXpert MRSA Assay (FDA approved for NASAL specimens only), is one component of a comprehensive MRSA colonization surveillance program. It is not intended to diagnose MRSA infection nor to guide or monitor treatment for MRSA infections. Performed at [facility], [address], [place], [place] [address]   Aerobic Culture (superficial specimen)     Status: None   Collection Time: 12/12/18  5:06 PM   Specimen: Wound  Result Value Ref Range Status   Specimen Description   Final    WOUND Performed at [facility], [address], [place], [place] [address]    Special Requests   Final    Normal Performed at [place], [place] [address]    Gram Stain   Final    NO WBC SEEN ABUNDANT GRAM NEGATIVE RODS MODERATE GRAM POSITIVE COCCI MODERATE GRAM NEGATIVE COCCOBACILLI RARE GRAM POSITIVE RODS    Culture   Final    MODERATE STAPHYLOCOCCUS AUREUS WITHIN MIXED ORGANISMS Performed at [facility], [place] [address], [place], [place] [address]    Report Status 12/15/2018 FINAL  Final   Organism ID, Bacteria STAPHYLOCOCCUS AUREUS  Final      Susceptibility   Staphylococcus aureus - MIC*    CIPROFLOXACIN >=8 RESISTANT Resistant     ERYTHROMYCIN <=0.25 SENSITIVE Sensitive     GENTAMICIN <=0.5 SENSITIVE Sensitive     OXACILLIN <=0.25 SENSITIVE Sensitive     TETRACYCLINE <=1 SENSITIVE Sensitive     VANCOMYCIN 1 SENSITIVE Sensitive     TRIMETH/SULFA <=10 SENSITIVE Sensitive     CLINDAMYCIN <=0.25 SENSITIVE Sensitive     RIFAMPIN <=0.5 SENSITIVE Sensitive     Inducible Clindamycin NEGATIVE Sensitive     * MODERATE STAPHYLOCOCCUS AUREUS  Aerobic/Anaerobic Culture (surgical/deep wound)     Status: None (Preliminary result)   Collection Time: 12/13/18 12:34 PM   Specimen: [facility] Other; Tissue  Result Value Ref Range Status   Specimen Description   Final    KNEE Performed at [facility], [address], [place], [place] [address]    Special Requests   Final    LEFT KNEE ABSCESS Performed at [facility], [address] [place], [place] [address]    Gram Stain   Final    NO WBC SEEN MODERATE GRAM POSITIVE COCCI RARE GRAM NEGATIVE RODS    Culture   Final    MODERATE BACTEROIDES VULGATUS MODERATE MORGANELLA MORGANII MODERATE KLEBSIELLA OXYTOCA FEW STREPTOCOCCUS MITIS/ORALIS BETA LACTAMASE POSITIVE FOR BACTEROIDES VULGATUS CULTURE REINCUBATED FOR BETTER GROWTH Performed at [facility], [place] [address], , [place] [address]    Report Status PENDING  Incomplete   Organism ID, Bacteria MORGANELLA MORGANII  Final   Organism ID, Bacteria KLEBSIELLA OXYTOCA  Final      Susceptibility   Klebsiella oxytoca - MIC*    AMPICILLIN >=32 RESISTANT Resistant     CEFAZOLIN 8 SENSITIVE Sensitive     CEFEPIME <=1 SENSITIVE Sensitive     CEFTAZIDIME <=1 SENSITIVE Sensitive     CEFTRIAXONE <=1  SENSITIVE Sensitive     CIPROFLOXACIN <=0.25 SENSITIVE Sensitive     GENTAMICIN <=1 SENSITIVE Sensitive     IMIPENEM <=0.25 SENSITIVE Sensitive     TRIMETH/SULFA <=20 SENSITIVE Sensitive     AMPICILLIN/SULBACTAM 16 INTERMEDIATE Intermediate     PIP/TAZO <=4 SENSITIVE Sensitive     Extended ESBL NEGATIVE Sensitive     * MODERATE KLEBSIELLA OXYTOCA   Morganella morganii - MIC*    AMPICILLIN >=32 RESISTANT Resistant     CEFAZOLIN >=64 RESISTANT Resistant     CEFEPIME <=1 SENSITIVE Sensitive     CEFTAZIDIME <=1 SENSITIVE Sensitive     CEFTRIAXONE <=1 SENSITIVE Sensitive     CIPROFLOXACIN <=0.25 SENSITIVE Sensitive     GENTAMICIN <=1 SENSITIVE Sensitive     IMIPENEM 4 SENSITIVE Sensitive     TRIMETH/SULFA <=20 SENSITIVE Sensitive     AMPICILLIN/SULBACTAM 16 INTERMEDIATE Intermediate     PIP/TAZO <=4 SENSITIVE Sensitive     * MODERATE MORGANELLA MORGANII  Aerobic/Anaerobic Culture (surgical/deep wound)     Status: None (Preliminary result)    Collection Time: 12/13/18 12:35 PM   Specimen: [facility] Other; Tissue  Result Value Ref Range Status   Specimen Description   Final    BONE Performed at [facility], [address], [place], [place] [address]    Special Requests   Final    LEFT CALCANEOUS BONE Performed at [facility], [address] [place], [place] [address]    Gram Stain   Final    NO WBC SEEN NO ORGANISMS SEEN Performed at [facility], [place] [address] [place], [place] [address]    Culture   Final    RARE STAPHYLOCOCCUS AUREUS CRITICAL RESULT CALLED TO, READ BACK BY AND VERIFIED WITH: R. DUNN RN, AT 1013 12/15/18 BY D. VANHOOK NO ANAEROBES ISOLATED; CULTURE IN PROGRESS FOR 5 DAYS    Report Status PENDING  Incomplete  SARS Coronavirus 2 [facility] order, Performed in [facility] hospital lab) Nasopharyngeal Nasopharyngeal Swab     Status: None   Collection Time: 12/14/18  5:11 PM   Specimen: Nasopharyngeal Swab  Result Value Ref Range Status   SARS Coronavirus 2 NEGATIVE NEGATIVE Final    Comment: (NOTE) If result is NEGATIVE SARS-CoV-2 target nucleic acids are NOT DETECTED. The SARS-CoV-2 RNA is generally detectable in upper and lower  respiratory specimens during the acute phase of infection. The lowest  concentration of SARS-CoV-2 viral copies this assay can detect is 250  copies / mL. A negative result does not preclude SARS-CoV-2 infection  and should not be used as the sole basis for treatment or other  patient management decisions.  A negative result may occur with  improper specimen collection / handling, submission of specimen other  than nasopharyngeal swab, presence of viral mutation(s) within the  areas targeted by this assay, and inadequate number of viral copies  (<250 copies / mL). A negative result must be combined with clinical  observations, patient history, and epidemiological information. If result is POSITIVE SARS-CoV-2 target nucleic acids are DETECTED. The  SARS-CoV-2 RNA is generally detectable in upper and lower  respiratory specimens dur ing the acute phase of infection.  Positive  results are indicative of active infection with SARS-CoV-2.  Clinical  correlation with patient history and other diagnostic information is  necessary to determine patient infection status.  Positive results do  not rule out bacterial infection or co-infection with other viruses. If result is PRESUMPTIVE POSTIVE SARS-CoV-2 nucleic acids MAY BE  PRESENT.   A presumptive positive result was obtained on the submitted specimen  and confirmed on repeat testing.  While 2019 novel coronavirus  (SARS-CoV-2) nucleic acids may be present in the submitted sample  additional confirmatory testing may be necessary for epidemiological  and / or clinical management purposes  to differentiate between  SARS-CoV-2 and other Sarbecovirus currently known to infect humans.  If clinically indicated additional testing with an alternate test  methodology [phone]) is advised. The SARS-CoV-2 RNA is generally  detectable in upper and lower respiratory sp ecimens during the acute  phase of infection. The expected result is Negative. Fact Sheet for Patients:  StrictlyIdeas.no Fact Sheet for Healthcare Providers: BankingDealers.co.za This test is not yet approved or cleared by the [place] FDA and has been authorized for detection and/or diagnosis of SARS-CoV-2 by FDA under an Emergency Use Authorization (EUA).  This EUA will remain in effect (meaning this test can be used) for the duration of the COVID-19 declaration under Section 564(b)(1) of the Act, 21 U.S.C. section 360bbb-3(b)(1), unless the authorization is terminated or revoked sooner. Performed at [facility], [address] [place], [place] [address]     RADIOLOGY:  [place] Ekg Site Rite  Result Date: 12/17/2018 If [facility] image not attached, placement could not  be confirmed due to current cardiac rhythm.    CODE STATUS:     Code Status Orders  (From admission, onward)         Start     Ordered   12/11/18 1432  Full code  Continuous     12/11/18 1432        Code Status History    Date Active Date Inactive Code Status Order ID Comments User Context   10/18/2018 0302 10/21/2018 1856 Full Code [id number]  Sidney Ace Arvella Merles, MD ED   12/14/2017 0342 12/16/2017 1814 Full Code [id number]  Amelia Jo, MD Inpatient   02/24/2016 2104 02/27/2016 1407 Full Code [id number]  Theodoro Grist, MD Inpatient   Advance Care Planning Activity      TOTAL TIME TAKING CARE OF THIS PATIENT: *40* minutes.    [name] M.D on 12/17/2018 at 3:01 PM  Between 7am to 6pm - Pager - [phone] After 6pm go to [email or web address] - password [email or web address] [place] [facility]  Office  [phone]  CC: Primary care physician; [name], DO

## 2018-12-17 NOTE — Progress Notes (Signed)
Handoff report called to [facility] at [facility].

## 2018-12-17 NOTE — Treatment Plan (Signed)
Diagnosis: Left heel wound with  osteomyelitis Has MSSA, morganella and kleb Baseline Creatinine 0.86  Culture Result: as above No Known Allergies  OPAT Orders Discharge antibiotics: Ceftriaxone 2 grams Iv every 24 hours until 01/10/19  ( may discontinue earlier after her follow up appt with ID in 2 weeks)  Flagyl 581m PO every 8 hours until 12/31/18  [facility] including placement of Biopatch  Labs weekly on Thursday  while on IV antibiotics: X__ CBC with differential  _X_ CMP  _X_ ESR   _X_ Please pull PIC at completion of IV antibiotics _ Fax weekly labs to [facility]([phone] Clinic Follow Up Appt:2 weeks   Call [phone] make appt

## 2018-12-17 NOTE — Care Management Important Message (Signed)
Important Message  Patient Details  Name: [name] MRN: [id number] Date of Birth: 10/15/1965   Medicare Important Message Given:  Yes     Dannette Barbara 12/17/2018, 10:18 AM

## 2018-12-18 ENCOUNTER — Telehealth: Payer: Self-pay

## 2018-12-18 ENCOUNTER — Ambulatory Visit (INDEPENDENT_AMBULATORY_CARE_PROVIDER_SITE_OTHER): Payer: Medicare Other | Admitting: Pharmacist

## 2018-12-18 DIAGNOSIS — M86172 Other acute osteomyelitis, left ankle and foot: Secondary | ICD-10-CM | POA: Diagnosis not present

## 2018-12-18 DIAGNOSIS — Z48817 Encounter for surgical aftercare following surgery on the skin and subcutaneous tissue: Secondary | ICD-10-CM | POA: Diagnosis not present

## 2018-12-18 DIAGNOSIS — E1149 Type 2 diabetes mellitus with other diabetic neurological complication: Secondary | ICD-10-CM

## 2018-12-18 DIAGNOSIS — E7849 Other hyperlipidemia: Secondary | ICD-10-CM | POA: Diagnosis not present

## 2018-12-18 DIAGNOSIS — IMO0002 Reserved for concepts with insufficient information to code with codable children: Secondary | ICD-10-CM

## 2018-12-18 DIAGNOSIS — E1165 Type 2 diabetes mellitus with hyperglycemia: Secondary | ICD-10-CM | POA: Diagnosis not present

## 2018-12-18 DIAGNOSIS — I10 Essential (primary) hypertension: Secondary | ICD-10-CM | POA: Diagnosis not present

## 2018-12-18 LAB — AEROBIC/ANAEROBIC CULTURE W GRAM STAIN (SURGICAL/DEEP WOUND): Gram Stain: NONE SEEN

## 2018-12-18 LAB — HIV ANTIBODY (ROUTINE TESTING W REFLEX): HIV Screen 4th Generation wRfx: NONREACTIVE

## 2018-12-18 NOTE — Chronic Care Management (AMB) (Signed)
  Chronic Care Management   Follow Up Note   12/18/2018 Name: [name] MRN: [id number] DOB: Jul 13, 1965  Referred by: [name], DO Reason for referral : Care Coordination   [name] is a 53 y.o. year old female who is a primary care patient of [name], DO. The CCM team was consulted for assistance with chronic disease management and care coordination needs.  [name] has a past medical history including but not limited to type 2 diabetes, hypertension, hyperlipidemia and recent hospitalization for left lower extremity cellulitis with bilateral heel infection, sepsis (10/2018).   Perform chart review [name] was hospitalized from 8/11-8/17 for osteomyelitis of left calcaneal. Status post wound debridement in OR on 12/14/18.  Per discharge summary, patient discharged to [facility].  Collaborate with CM Nurse Case Manager and CM Social Worker  Plan  Will follow for patient's discharge from [facility] to schedule next outreach call.  Harlow Asa, PharmD, [employer] [phone]

## 2018-12-19 LAB — AEROBIC/ANAEROBIC CULTURE W GRAM STAIN (SURGICAL/DEEP WOUND): Gram Stain: NONE SEEN

## 2018-12-24 ENCOUNTER — Ambulatory Visit: Payer: Medicare Other | Admitting: Family Medicine

## 2018-12-25 DIAGNOSIS — Z48817 Encounter for surgical aftercare following surgery on the skin and subcutaneous tissue: Secondary | ICD-10-CM | POA: Diagnosis not present

## 2018-12-25 DIAGNOSIS — M86172 Other acute osteomyelitis, left ankle and foot: Secondary | ICD-10-CM | POA: Diagnosis not present

## 2018-12-25 DIAGNOSIS — B964 Proteus (mirabilis) (morganii) as the cause of diseases classified elsewhere: Secondary | ICD-10-CM | POA: Diagnosis not present

## 2018-12-25 DIAGNOSIS — R262 Difficulty in walking, not elsewhere classified: Secondary | ICD-10-CM | POA: Diagnosis not present

## 2018-12-26 DIAGNOSIS — E1129 Type 2 diabetes mellitus with other diabetic kidney complication: Secondary | ICD-10-CM | POA: Diagnosis not present

## 2018-12-26 DIAGNOSIS — E1165 Type 2 diabetes mellitus with hyperglycemia: Secondary | ICD-10-CM | POA: Diagnosis not present

## 2018-12-26 DIAGNOSIS — E1159 Type 2 diabetes mellitus with other circulatory complications: Secondary | ICD-10-CM | POA: Diagnosis not present

## 2018-12-26 DIAGNOSIS — G629 Polyneuropathy, unspecified: Secondary | ICD-10-CM | POA: Diagnosis not present

## 2018-12-31 ENCOUNTER — Ambulatory Visit: Payer: Self-pay | Admitting: *Deleted

## 2018-12-31 ENCOUNTER — Telehealth: Payer: Self-pay

## 2018-12-31 DIAGNOSIS — E1149 Type 2 diabetes mellitus with other diabetic neurological complication: Secondary | ICD-10-CM

## 2018-12-31 DIAGNOSIS — IMO0002 Reserved for concepts with insufficient information to code with codable children: Secondary | ICD-10-CM

## 2018-12-31 DIAGNOSIS — E1142 Type 2 diabetes mellitus with diabetic polyneuropathy: Secondary | ICD-10-CM | POA: Diagnosis not present

## 2018-12-31 DIAGNOSIS — M86272 Subacute osteomyelitis, left ankle and foot: Secondary | ICD-10-CM | POA: Diagnosis not present

## 2018-12-31 DIAGNOSIS — L97422 Non-pressure chronic ulcer of left heel and midfoot with fat layer exposed: Secondary | ICD-10-CM | POA: Diagnosis not present

## 2018-12-31 NOTE — Chronic Care Management (AMB) (Signed)
Chronic Care Management   Follow Up Note   12/31/2018 Name: MINERVIA OSSO MRN: [id number] DOB: [birth year]  Referred by: [name], DO Reason for referral : Chronic Care Management (Diabetes )   ASHAUNTI TREPTOW is a 53 y.o. year old female who is a primary care patient of [name], DO. The CCM team was consulted for assistance with chronic disease management and care coordination needs.    Review of patient status, including review of consultants reports, relevant laboratory and other test results, and collaboration with appropriate care team members and the patient's provider was performed as part of comprehensive patient evaluation and provision of chronic care management services.    SDOH (Social Determinants of Health) screening performed today: None. See Care Plan for related entries.   Outpatient Encounter Medications as of 12/31/2018  Medication Sig Note  . amLODipine (NORVASC) 5 MG tablet Take 1 tablet (5 mg total) by mouth daily.   . ARIPiprazole (ABILIFY) 10 MG tablet Take 10 mg by mouth daily.   [name] atorvastatin (LIPITOR) 40 MG tablet Take 40 mg by mouth daily.   . B-D UF III MINI PEN NEEDLES 31G X 5 MM MISC USE AS DIRECTED   . Blood Glucose Monitoring Suppl (ONETOUCH VERIO) w/Device KIT Use to check blood sugar up to 3 x daily   . buPROPion (WELLBUTRIN XL) 150 MG 24 hr tablet Take 150 mg by mouth daily. Take in AM   . cefTRIAXone (ROCEPHIN) IVPB Inject 2 g into the vein daily for 24 days. Indication:  Left heel wound with  osteomyelitis Last Day of Therapy:  01/10/2019 Labs - Once weekly:  CBC/D, CMP, and ESR Please pull PIC at completion of IV antibiotics   . HUMALOG KWIKPEN 100 UNIT/ML KwikPen Inject 0.05 mLs (5 Units total) into the skin 3 (three) times daily with meals. + Sliding scale   . Insulin Glargine (LANTUS SOLOSTAR) 100 UNIT/ML Solostar Pen Inject 40 Units into the skin daily. Adjust dose as advised   . Lancets (ONETOUCH  ULTRASOFT) lancets Use to check blood sugar up to 3 x daily   . lisinopril (PRINIVIL,ZESTRIL) 20 MG tablet TAKE 1 TABLET(20 MG) BY MOUTH DAILY (Patient taking differently: Take 20 mg by mouth daily. )   . metFORMIN (GLUCOPHAGE) 1000 MG tablet TAKE 1 TABLET(1000 MG) BY MOUTH TWICE DAILY WITH A MEAL   . metroNIDAZOLE (FLAGYL) 500 MG tablet Take 1 tablet (500 mg total) by mouth every 8 (eight) hours for 14 days.   Glory Rosebush VERIO test strip Check blood sugar up to 3 x daily as needed   . PARoxetine (PAXIL) 20 MG tablet Take 1 tablet (20 mg total) by mouth daily. Prescribed by [facility] 12/11/2018: Patient will take last dose of Paxil on 8/12   No facility-administered encounter medications on file as of 12/31/2018.      Goals Addressed            This Visit's Progress   . RNCM- I want to get my diabetes back on track (pt-stated)       Current Barriers:  [name] Knowledge Deficits related to basic Diabetes pathophysiology and self care/management . Knowledge Deficits related to medications used for management of diabetes . Does not use cbg meter   Case Manager Clinical Goal(s):  Over the next 90 days, patient will demonstrate improved adherence to prescribed treatment plan for diabetes self care/management as evidenced by:  . daily monitoring and recording of CBG  . adherence to  ADA/ carb modified diet . exercise 3 days/week . adherence to prescribed medication regimen  Interventions:  . Attempted to call patient without success. . Placed a call to the emergency contact listed [name] (patient's daughter) . Daughter states patient continues to be at [facility] facility. She stated she has a PICC line receiving antibiotics and PT.  [name] Daughter stated patient's sugars have been WNL related to the patient has been getting her medications regularly and her diet is being monitored.  Hanley Seamen the daughter my contact information for future needs.  . Did not have SNF d/c date as of yet.    Patient Self Care Activities:  . UNABLE to independently self manage diabetes as evidenced by A1C >12  Please see past updates related to this goal by clicking on the "Past Updates" button in the selected goal          The care management team will reach out to the patient again over the next 30 days.  The patient's daughter has been provided with contact information for the care management team and has been advised to call with any health related questions or concerns.    [name] Sonny Poth RN, BSN Nurse Case Pharmacist, [facility]  [phone]) [employer]

## 2018-12-31 NOTE — Patient Instructions (Signed)
Thank you allowing the Chronic Care Management Team to be a part of your care! It was a pleasure speaking with you today!   CCM (Chronic Care Management) Team   Sharae Zappulla RN, BSN Nurse Care Coordinator  [phone]  Harlow Asa PharmD  Clinical Pharmacist  [phone]  [name] LCSW Clinical Social Worker [phone]  Goals Addressed            This Visit's Progress   . RNCM- I want to get my diabetes back on track (pt-stated)       Current Barriers:  [name] Knowledge Deficits related to basic Diabetes pathophysiology and self care/management . Knowledge Deficits related to medications used for management of diabetes . Does not use cbg meter   Case Manager Clinical Goal(s):  Over the next 90 days, patient will demonstrate improved adherence to prescribed treatment plan for diabetes self care/management as evidenced by:  . daily monitoring and recording of CBG  . adherence to ADA/ carb modified diet . exercise 3 days/week . adherence to prescribed medication regimen  Interventions:  . Attempted to call patient without success. . Placed a call to the emergency contact listed [name] (patient's daughter) . Daughter states patient continues to be at [facility] facility. She stated she has a PICC line receiving antibiotics and PT.  [name] Daughter stated patient's sugars have been WNL related to the patient has been getting her medications regularly and her diet is being monitored.  Hanley Seamen the daughter my contact information for future needs.  . Did not have SNF d/c date as of yet.   Patient Self Care Activities:  . UNABLE to independently self manage diabetes as evidenced by A1C >12  Please see past updates related to this goal by clicking on the "Past Updates" button in the selected goal         The patient verbalized understanding of instructions provided today and declined a print copy of patient instruction materials.   The patient has been  provided with contact information for the care management team and has been advised to call with any health related questions or concerns.

## 2019-01-01 ENCOUNTER — Ambulatory Visit: Payer: Medicare Other | Attending: Infectious Diseases | Admitting: Infectious Diseases

## 2019-01-01 ENCOUNTER — Encounter: Payer: Self-pay | Admitting: Infectious Diseases

## 2019-01-01 ENCOUNTER — Other Ambulatory Visit: Payer: Self-pay

## 2019-01-01 VITALS — BP 102/62 | HR 115 | Temp 97.8°F | Ht 70.0 in

## 2019-01-01 DIAGNOSIS — L089 Local infection of the skin and subcutaneous tissue, unspecified: Secondary | ICD-10-CM

## 2019-01-01 DIAGNOSIS — B954 Other streptococcus as the cause of diseases classified elsewhere: Secondary | ICD-10-CM

## 2019-01-01 DIAGNOSIS — F259 Schizoaffective disorder, unspecified: Secondary | ICD-10-CM

## 2019-01-01 DIAGNOSIS — E785 Hyperlipidemia, unspecified: Secondary | ICD-10-CM

## 2019-01-01 DIAGNOSIS — B952 Enterococcus as the cause of diseases classified elsewhere: Secondary | ICD-10-CM

## 2019-01-01 DIAGNOSIS — B9561 Methicillin susceptible Staphylococcus aureus infection as the cause of diseases classified elsewhere: Secondary | ICD-10-CM

## 2019-01-01 DIAGNOSIS — B966 Bacteroides fragilis [B. fragilis] as the cause of diseases classified elsewhere: Secondary | ICD-10-CM

## 2019-01-01 DIAGNOSIS — B961 Klebsiella pneumoniae [K. pneumoniae] as the cause of diseases classified elsewhere: Secondary | ICD-10-CM | POA: Diagnosis not present

## 2019-01-01 DIAGNOSIS — E1142 Type 2 diabetes mellitus with diabetic polyneuropathy: Secondary | ICD-10-CM

## 2019-01-01 DIAGNOSIS — Z87891 Personal history of nicotine dependence: Secondary | ICD-10-CM

## 2019-01-01 DIAGNOSIS — E11628 Type 2 diabetes mellitus with other skin complications: Secondary | ICD-10-CM | POA: Diagnosis not present

## 2019-01-01 DIAGNOSIS — B9689 Other specified bacterial agents as the cause of diseases classified elsewhere: Secondary | ICD-10-CM

## 2019-01-01 DIAGNOSIS — Z79899 Other long term (current) drug therapy: Secondary | ICD-10-CM

## 2019-01-01 DIAGNOSIS — I10 Essential (primary) hypertension: Secondary | ICD-10-CM

## 2019-01-01 DIAGNOSIS — Z794 Long term (current) use of insulin: Secondary | ICD-10-CM

## 2019-01-01 NOTE — Patient Instructions (Signed)
You are here for follow up of the left foot infection- you are on IV ceftriaxone and oral flagyl- The flagyl can be stopped now . Continue Iv ceftriaxone until 01/10/19. You have a follow up appt with Dr.BAker next week and we can decide then whether the Iv antibiotic can be switched to pill

## 2019-01-01 NOTE — Progress Notes (Signed)
NAME: [name]  DOB: [birth year]  MRN: [id number]  Date/Time: 01/01/2019 10:34 AM   Subjective:   ?Patient here for follow-up after recent hospitalization between 12/11/2018 until 12/17/2018 for  left foot infection.  She was sent to [facility] care with IV ceftriaxone and p.o. Flagyl.  The p.o. Flagyl stop date was 12/31/2018 and the IV ceftriaxone was 01/10/2019.  She followed up with Dr. Luana Shu podiatrist on 12/31/2018 as outpatient .  [name] is a 53 y.o. female with a history of poorly controlled diabetes, peripheral neuropathy, hyperlipidemia hypertension schizoaffective disorder was in [facility] between 12/11/2018 until 12/17/2018.  On 8/13 she underwent wound debridement to the level of the bone with 100% excision of infected tissue.  A bone culture and wound culture were sent on 12/13/2018.  The wound culture grew Klebsiella, Morganella, anaerobes like Prevotella, strep mitis and staph aureus.  The bone culture had not finalized when she was discharged on to the nursing home on IV ceftriaxone and p.o. Flagyl.  She is now here for follow-up. She does not have any fever or chills. No diarrhea or rash. Left foot is not hurting her.  She wants to know whether she can go on oral antibiotics.    Past Medical History:  Diagnosis Date  . Depression   . Diabetes mellitus without complication ([place])   . Hyperlipidemia   . Hypertension   . Neuromuscular disorder ([place])   . Schizo affective schizophrenia ([place])    abilify and pazil    Past Surgical History:  Procedure Laterality Date  . CHOLECYSTECTOMY    . IRRIGATION AND DEBRIDEMENT FOOT Right 02/26/2016   Procedure: RIGHT 2ND TOE DEBRIDEMENT;  Surgeon: Samara Deist, DPM;  Location: [facility] ORS;  Service: Podiatry;  Laterality: Right;  . IRRIGATION AND DEBRIDEMENT FOOT Left 12/13/2018   Procedure: IRRIGATION AND DEBRIDEMENT FOOT;  Surgeon: Caroline More, DPM;  Location: [facility] ORS;  Service: Podiatry;  Laterality: Left;     Social History   Socioeconomic History  . Marital status: Divorced    Spouse name: Not on file  . Number of children: 2  . Years of education: Not on file  . Highest education level: Not on file  Occupational History  . Not on file  Social Needs  . Financial resource strain: Not hard at all  . Food insecurity    Worry: Never true    Inability: Never true  . Transportation needs    Medical: No    Non-medical: No  Tobacco Use  . Smoking status: Former Smoker    Packs/day: 3.00    Years: 30.00    Pack years: 90.00    Types: Cigarettes    Quit date: 05/02/2009    Years since quitting: 9.6  . Smokeless tobacco: Former Systems developer  . Tobacco comment: Pt reported  quitting in 2011  Substance and Sexual Activity  . Alcohol use: No  . Drug use: No  . Sexual activity: Not Currently  Lifestyle  . Physical activity    Days per week: 0 days    Minutes per session: 0 min  . Stress: Not at all  Relationships  . Social connections    Talks on phone: More than three times a week    Gets together: More than three times a week    Attends religious service: More than 4 times per year    Active member of club or organization: No    Attends meetings of clubs or organizations: Never    Relationship  status: Divorced  . Intimate partner violence    Fear of current or ex partner: No    Emotionally abused: No    Physically abused: No    Forced sexual activity: No  Other Topics Concern  . Not on file  Social History Narrative   At home with mom, 60. Lives in brothers house who moved out. Sister comes by every other day to check on patient and mom.    Family History  Problem Relation Age of Onset  . Breast cancer Cousin        maternal cousin  . Hypertension Mother   . Hyperthyroidism Mother   . Diabetes Maternal Grandmother   . Hypertension Maternal Grandmother   . Cancer Maternal Grandmother        unknown type  . Diabetes Maternal Grandfather   . Hypertension Maternal Grandfather    . Diabetes Paternal Grandmother   . Hypertension Paternal Grandmother   . Diabetes Paternal Grandfather   . Hypertension Paternal Grandfather    No Known Allergies  ? Current Outpatient Medications  Medication Sig Dispense Refill  . amLODipine (NORVASC) 5 MG tablet Take 1 tablet (5 mg total) by mouth daily. 30 tablet 0  . ARIPiprazole (ABILIFY) 10 MG tablet Take 10 mg by mouth daily.    [name] atorvastatin (LIPITOR) 40 MG tablet Take 40 mg by mouth daily.    . B-D UF III MINI PEN NEEDLES 31G X 5 MM MISC USE AS DIRECTED 100 each 3  . Blood Glucose Monitoring Suppl (ONETOUCH VERIO) w/Device KIT Use to check blood sugar up to 3 x daily 1 kit 0  . buPROPion (WELLBUTRIN XL) 150 MG 24 hr tablet Take 150 mg by mouth daily. Take in AM    . cefTRIAXone (ROCEPHIN) IVPB Inject 2 g into the vein daily for 24 days. Indication:  Left heel wound with  osteomyelitis Last Day of Therapy:  01/10/2019 Labs - Once weekly:  CBC/D, CMP, and ESR Please pull PIC at completion of IV antibiotics 24 Units 0  . HUMALOG KWIKPEN 100 UNIT/ML KwikPen Inject 0.05 mLs (5 Units total) into the skin 3 (three) times daily with meals. + Sliding scale 15 mL 5  . Insulin Glargine (LANTUS SOLOSTAR) 100 UNIT/ML Solostar Pen Inject 40 Units into the skin daily. Adjust dose as advised 15 mL 1  . Lancets (ONETOUCH ULTRASOFT) lancets Use to check blood sugar up to 3 x daily 100 each 12  . lisinopril (PRINIVIL,ZESTRIL) 20 MG tablet TAKE 1 TABLET(20 MG) BY MOUTH DAILY (Patient taking differently: Take 20 mg by mouth daily. ) 90 tablet 1  . metFORMIN (GLUCOPHAGE) 1000 MG tablet TAKE 1 TABLET(1000 MG) BY MOUTH TWICE DAILY WITH A MEAL 180 tablet 1  . ONETOUCH VERIO test strip Check blood sugar up to 3 x daily as needed 100 each 11  . PARoxetine (PAXIL) 20 MG tablet Take 1 tablet (20 mg total) by mouth daily. Prescribed by [facility] 30 tablet 0   No current facility-administered medications for this visit.      Abtx:  Anti-infectives  (From admission, onward)   None      REVIEW OF SYSTEMS:  Const: negative fever, negative chills, negative weight loss Eyes: negative diplopia or visual changes, negative eye pain ENT: negative coryza, negative sore throat Resp: negative cough, hemoptysis, dyspnea Cards: negative for chest pain, palpitations, lower extremity edema GU: negative for frequency, dysuria and hematuria GI: Negative for abdominal pain, diarrhea, bleeding, constipation Skin: negative for rash and  pruritus Heme: negative for easy bruising and gum/nose bleeding MS: negative for myalgias, arthralgias, back pain and muscle weakness Neurolo:negative for headaches, dizziness, vertigo, memory problems  Psych: negative for feelings of anxiety, depression  Endocrine: negative for thyroid, diabetes Allergy/Immunology- negative for any medication or food allergies  Objective:  VITALS:  BP 102/62 (BP Location: Left Arm, Patient Position: Sitting, Cuff Size: Normal)   Pulse (!) 115   Temp 97.8 F (36.6 C) (Oral)   Ht _0  (1.778 m)   BMI 37.31 kg/m  PHYSICAL EXAM:  General: Alert, cooperative, no distress, appears stated age.  Head: Normocephalic, without obvious abnormality, atraumatic. Eyes: Conjunctivae clear, anicteric sclerae. Pupils are equal ENT did not examine because of mask Neck: Supple,  Back: No CVA tenderness. Lungs: Clear to auscultation bilaterally. No Wheezing or Rhonchi. No rales. Heart: Regular rate and rhythm, no murmur, rub or gallop. Abdomen: Soft, non-tender,not distended. Bowel sounds normal. No masses Extremities: Left foot dressing removed. Surgical wound on the heel is almost closed except at one spot where there is a small opening. No purulent discharge.  It is a little bloody discharge.  Skin: No rashes or lesions. Or bruising Lymph: Cervical, supraclavicular normal. Neurologic: Grossly non-focal Pertinent Labs Lab Results CBC    Component Value Date/Time   WBC 10.3  12/14/2018 0335   RBC 3.67 (L) 12/14/2018 0335   HGB 9.4 (L) 12/14/2018 0335   HGB 15.2 08/19/2012 1224   HCT 30.8 (L) 12/14/2018 0335   HCT 45.7 08/19/2012 1224   PLT 330 12/14/2018 0335   PLT 203 08/19/2012 1224   MCV 83.9 12/14/2018 0335   MCV 89 08/19/2012 1224   MCH 25.6 (L) 12/14/2018 0335   MCHC 30.5 12/14/2018 0335   RDW 16.5 (H) 12/14/2018 0335   RDW 16.4 (H) 08/19/2012 1224   LYMPHSABS 2.7 12/11/2018 1604   MONOABS 0.8 12/11/2018 1604   EOSABS 0.3 12/11/2018 1604   BASOSABS 0.1 12/11/2018 1604    CMP Latest Ref Rng & Units 12/17/2018 12/16/2018 12/15/2018  Glucose 70 - 99 mg/dL - - 215(H)  BUN 6 - 20 mg/dL - - 16  Creatinine 0.44 - 1.00 mg/dL 0.86 0.95 0.92  Sodium 135 - 145 mmol/L - - 134(L)  Potassium 3.5 - 5.1 mmol/L - - 4.8  Chloride 98 - 111 mmol/L - - 103  CO2 22 - 32 mmol/L - - 24  Calcium 8.9 - 10.3 mg/dL - - 9.4  Total Protein 6.5 - 8.1 g/dL - - -  Total Bilirubin 0.3 - 1.2 mg/dL - - -  Alkaline Phos 38 - 126 U/L - - -  AST 15 - 41 U/L - - -  ALT 0 - 44 U/L - - -      Microbiology: On 12/17/2018 she was discharged home and the bone culture finalized on 12/19/2018 as enterococcus and staph aureus IMAGING RESULTS: I have personally reviewed the films ? Impression/Recommendation ?53 year old female with history of diabetes mellitus, left foot infection.  Left foot infection: Currently on ceftriaxone and Flagyl for cultures that were positive for Morganella, Klebsiella, staph aureus, Bacteroides, strep mitis. The bone culture finalized on 12/19/2018 after the patient was discharged and it came back as rare enterococcus plus staph aureus.  Ceftriaxone and Flagyl will not cover enterococcus. So we will stop that to and changed to Unasyn which is sulbactam plus ampicillin 1.5 g IV every 6 hours.  Diabetes mellitus on metformin 1 g every 12 and Lantus.  Schizoaffective disorder.  On paroxetine,  aripiprazole and bupropion.  I spoke to her nurse Janett Billow in the  nursing home to change the antibiotic.  Her current creatinine from 12/19/2018 is 2.4 whereas on 12/17/2018 in the hospital it was 0.84.  Based on that her creatinine clearance will be around 37.  If the creatinine clearance becomes less than 30 the Unasyn dose will have to be changed to every 12 hrs.  Told them to consult their pharmacist for dose adjustment.  sulbactam /ampicillin which is Unasyn can be given until 01/10/2019 and then later on can be switched to oral Augmentin once she sees her foot surgeon next week. I also discussed with Dr. Luana Shu her foot surgeon.  ?  Diabetes mellitus on metformin 1 g every 12 and Lantus.  If the creatinine is up and creatinine clearance is going down metformin dose will have to be reduced or put on hold.  I did inform the nursing home to discuss with her physician at [facility]  Hypertension on lisinopril 20 mg once a day and amlodipine.. ___________________________________________________ I was unable to discuss with the patient the change in plan for the antibiotics as the conversation with Dr. Luana Shu regarding antibiotic change happened after she left the clinic. But I did call the nursing home and spoke with Janett Billow her nurse and explained everything to her and I am faxing all the information to her for change of treatment.   Note:  This document was prepared using Dragon voice recognition software and may include unintentional dictation errors.

## 2019-01-03 ENCOUNTER — Other Ambulatory Visit: Payer: Self-pay

## 2019-01-03 ENCOUNTER — Ambulatory Visit: Payer: Medicare Other | Admitting: Licensed Clinical Social Worker

## 2019-01-03 NOTE — Chronic Care Management (AMB) (Signed)
  Care Management   Follow Up Note   01/03/2019 Name: BEYONKA PITNEY MRN: [id number] DOB: 1966/02/15  Referred by: [name], DO Reason for referral : Care Coordination   JONIA OAKEY is a 53 y.o. year old female who is a primary care patient of [name], DO. The care management team was consulted for assistance with care management and care coordination needs.    Review of patient status, including review of consultants reports, relevant laboratory and other test results, and collaboration with appropriate care team members and the patient's provider was performed as part of comprehensive patient evaluation and provision of chronic care management services.    LCSW completed CCM outreach attempt today but was unable to reach patient's daughter successfully. A HIPPA compliant voice message was left encouraging patient/family to return call once available. LCSW rescheduled CCM SW appointment as well. LCSW is aware that patient is currently at [facility].  A HIPPA compliant phone message was left for the patient providing contact information and requesting a return call.   [name], BSW, MSW, [email or web address] Phone: [phone]

## 2019-01-04 DIAGNOSIS — Z79899 Other long term (current) drug therapy: Secondary | ICD-10-CM | POA: Diagnosis not present

## 2019-01-09 ENCOUNTER — Other Ambulatory Visit: Payer: Self-pay | Admitting: Family Medicine

## 2019-01-09 DIAGNOSIS — M86272 Subacute osteomyelitis, left ankle and foot: Secondary | ICD-10-CM | POA: Diagnosis not present

## 2019-01-09 DIAGNOSIS — L97522 Non-pressure chronic ulcer of other part of left foot with fat layer exposed: Secondary | ICD-10-CM | POA: Diagnosis not present

## 2019-01-09 DIAGNOSIS — E1142 Type 2 diabetes mellitus with diabetic polyneuropathy: Secondary | ICD-10-CM | POA: Diagnosis not present

## 2019-01-09 DIAGNOSIS — M86172 Other acute osteomyelitis, left ankle and foot: Secondary | ICD-10-CM | POA: Diagnosis not present

## 2019-01-09 DIAGNOSIS — Z48817 Encounter for surgical aftercare following surgery on the skin and subcutaneous tissue: Secondary | ICD-10-CM | POA: Diagnosis not present

## 2019-01-15 DIAGNOSIS — G9009 Other idiopathic peripheral autonomic neuropathy: Secondary | ICD-10-CM | POA: Diagnosis not present

## 2019-01-15 DIAGNOSIS — R279 Unspecified lack of coordination: Secondary | ICD-10-CM | POA: Diagnosis not present

## 2019-01-15 DIAGNOSIS — E0821 Diabetes mellitus due to underlying condition with diabetic nephropathy: Secondary | ICD-10-CM | POA: Diagnosis not present

## 2019-01-15 DIAGNOSIS — M86172 Other acute osteomyelitis, left ankle and foot: Secondary | ICD-10-CM | POA: Diagnosis not present

## 2019-01-15 DIAGNOSIS — R2689 Other abnormalities of gait and mobility: Secondary | ICD-10-CM | POA: Diagnosis not present

## 2019-01-15 DIAGNOSIS — I10 Essential (primary) hypertension: Secondary | ICD-10-CM | POA: Diagnosis not present

## 2019-01-15 DIAGNOSIS — M6281 Muscle weakness (generalized): Secondary | ICD-10-CM | POA: Diagnosis not present

## 2019-01-16 DIAGNOSIS — E1142 Type 2 diabetes mellitus with diabetic polyneuropathy: Secondary | ICD-10-CM | POA: Diagnosis not present

## 2019-01-16 DIAGNOSIS — L97522 Non-pressure chronic ulcer of other part of left foot with fat layer exposed: Secondary | ICD-10-CM | POA: Diagnosis not present

## 2019-01-17 DIAGNOSIS — M86172 Other acute osteomyelitis, left ankle and foot: Secondary | ICD-10-CM | POA: Diagnosis not present

## 2019-01-17 DIAGNOSIS — M2041 Other hammer toe(s) (acquired), right foot: Secondary | ICD-10-CM | POA: Diagnosis not present

## 2019-01-17 DIAGNOSIS — G709 Myoneural disorder, unspecified: Secondary | ICD-10-CM | POA: Diagnosis not present

## 2019-01-17 DIAGNOSIS — E785 Hyperlipidemia, unspecified: Secondary | ICD-10-CM | POA: Diagnosis not present

## 2019-01-17 DIAGNOSIS — E1142 Type 2 diabetes mellitus with diabetic polyneuropathy: Secondary | ICD-10-CM | POA: Diagnosis not present

## 2019-01-17 DIAGNOSIS — I10 Essential (primary) hypertension: Secondary | ICD-10-CM | POA: Diagnosis not present

## 2019-01-17 DIAGNOSIS — E1169 Type 2 diabetes mellitus with other specified complication: Secondary | ICD-10-CM | POA: Diagnosis not present

## 2019-01-17 DIAGNOSIS — Z9181 History of falling: Secondary | ICD-10-CM | POA: Diagnosis not present

## 2019-01-18 DIAGNOSIS — G709 Myoneural disorder, unspecified: Secondary | ICD-10-CM | POA: Diagnosis not present

## 2019-01-18 DIAGNOSIS — M86172 Other acute osteomyelitis, left ankle and foot: Secondary | ICD-10-CM | POA: Diagnosis not present

## 2019-01-18 DIAGNOSIS — E1169 Type 2 diabetes mellitus with other specified complication: Secondary | ICD-10-CM | POA: Diagnosis not present

## 2019-01-18 DIAGNOSIS — M2041 Other hammer toe(s) (acquired), right foot: Secondary | ICD-10-CM | POA: Diagnosis not present

## 2019-01-18 DIAGNOSIS — E785 Hyperlipidemia, unspecified: Secondary | ICD-10-CM | POA: Diagnosis not present

## 2019-01-18 DIAGNOSIS — E1142 Type 2 diabetes mellitus with diabetic polyneuropathy: Secondary | ICD-10-CM | POA: Diagnosis not present

## 2019-01-18 DIAGNOSIS — I10 Essential (primary) hypertension: Secondary | ICD-10-CM | POA: Diagnosis not present

## 2019-01-18 DIAGNOSIS — Z9181 History of falling: Secondary | ICD-10-CM | POA: Diagnosis not present

## 2019-01-19 DIAGNOSIS — E1142 Type 2 diabetes mellitus with diabetic polyneuropathy: Secondary | ICD-10-CM | POA: Diagnosis not present

## 2019-01-19 DIAGNOSIS — Z9181 History of falling: Secondary | ICD-10-CM | POA: Diagnosis not present

## 2019-01-19 DIAGNOSIS — M86172 Other acute osteomyelitis, left ankle and foot: Secondary | ICD-10-CM | POA: Diagnosis not present

## 2019-01-19 DIAGNOSIS — E785 Hyperlipidemia, unspecified: Secondary | ICD-10-CM | POA: Diagnosis not present

## 2019-01-19 DIAGNOSIS — M2041 Other hammer toe(s) (acquired), right foot: Secondary | ICD-10-CM | POA: Diagnosis not present

## 2019-01-19 DIAGNOSIS — E1169 Type 2 diabetes mellitus with other specified complication: Secondary | ICD-10-CM | POA: Diagnosis not present

## 2019-01-19 DIAGNOSIS — G709 Myoneural disorder, unspecified: Secondary | ICD-10-CM | POA: Diagnosis not present

## 2019-01-19 DIAGNOSIS — E11621 Type 2 diabetes mellitus with foot ulcer: Secondary | ICD-10-CM | POA: Diagnosis not present

## 2019-01-19 DIAGNOSIS — I10 Essential (primary) hypertension: Secondary | ICD-10-CM | POA: Diagnosis not present

## 2019-01-21 DIAGNOSIS — G709 Myoneural disorder, unspecified: Secondary | ICD-10-CM | POA: Diagnosis not present

## 2019-01-21 DIAGNOSIS — E1142 Type 2 diabetes mellitus with diabetic polyneuropathy: Secondary | ICD-10-CM | POA: Diagnosis not present

## 2019-01-21 DIAGNOSIS — I10 Essential (primary) hypertension: Secondary | ICD-10-CM | POA: Diagnosis not present

## 2019-01-21 DIAGNOSIS — E1169 Type 2 diabetes mellitus with other specified complication: Secondary | ICD-10-CM | POA: Diagnosis not present

## 2019-01-21 DIAGNOSIS — E785 Hyperlipidemia, unspecified: Secondary | ICD-10-CM | POA: Diagnosis not present

## 2019-01-21 DIAGNOSIS — M2041 Other hammer toe(s) (acquired), right foot: Secondary | ICD-10-CM | POA: Diagnosis not present

## 2019-01-21 DIAGNOSIS — M86172 Other acute osteomyelitis, left ankle and foot: Secondary | ICD-10-CM | POA: Diagnosis not present

## 2019-01-21 DIAGNOSIS — Z9181 History of falling: Secondary | ICD-10-CM | POA: Diagnosis not present

## 2019-01-23 DIAGNOSIS — I10 Essential (primary) hypertension: Secondary | ICD-10-CM | POA: Diagnosis not present

## 2019-01-23 DIAGNOSIS — M86172 Other acute osteomyelitis, left ankle and foot: Secondary | ICD-10-CM | POA: Diagnosis not present

## 2019-01-23 DIAGNOSIS — E785 Hyperlipidemia, unspecified: Secondary | ICD-10-CM | POA: Diagnosis not present

## 2019-01-23 DIAGNOSIS — E1169 Type 2 diabetes mellitus with other specified complication: Secondary | ICD-10-CM | POA: Diagnosis not present

## 2019-01-23 DIAGNOSIS — G709 Myoneural disorder, unspecified: Secondary | ICD-10-CM | POA: Diagnosis not present

## 2019-01-23 DIAGNOSIS — M2041 Other hammer toe(s) (acquired), right foot: Secondary | ICD-10-CM | POA: Diagnosis not present

## 2019-01-23 DIAGNOSIS — Z9181 History of falling: Secondary | ICD-10-CM | POA: Diagnosis not present

## 2019-01-23 DIAGNOSIS — M86272 Subacute osteomyelitis, left ankle and foot: Secondary | ICD-10-CM | POA: Diagnosis not present

## 2019-01-23 DIAGNOSIS — E1142 Type 2 diabetes mellitus with diabetic polyneuropathy: Secondary | ICD-10-CM | POA: Diagnosis not present

## 2019-01-23 DIAGNOSIS — L97522 Non-pressure chronic ulcer of other part of left foot with fat layer exposed: Secondary | ICD-10-CM | POA: Diagnosis not present

## 2019-01-25 ENCOUNTER — Ambulatory Visit: Payer: Self-pay | Admitting: Pharmacist

## 2019-01-25 DIAGNOSIS — IMO0002 Reserved for concepts with insufficient information to code with codable children: Secondary | ICD-10-CM

## 2019-01-25 DIAGNOSIS — E1149 Type 2 diabetes mellitus with other diabetic neurological complication: Secondary | ICD-10-CM

## 2019-01-25 NOTE — Patient Instructions (Signed)
Thank you allowing the Chronic Care Management Team to be a part of your care! It was a pleasure speaking with you today!     CCM (Chronic Care Management) Team    Janci Minor RN, BSN Nurse Care Coordinator  [phone]   Harlow Asa PharmD  Clinical Pharmacist  [phone]   [name] LCSW Clinical Social Worker [phone]  Visit Information  Goals Addressed            This Visit's Progress   . PharmD - Medication Management       Current Barriers:  . Financial Barriers . Non adherence to prescribed medication regimen  Pharmacist Clinical Goal(s):  [name] Over the next 30 days, patient will work with [name] to address needs related to optimization of medication regimen and improvement of medication adherence  Interventions: . Schedule appointment to complete medication review with patient and daughter. . Will collaborate with CM Nurse Case Manager and Social Worker to advise patient has been discharged home.  Patient Self Care Activities:  . Self administers medications as prescribed o Using weekly pillbox and calendar as adherence tools . Attends all scheduled provider appointments . Calls pharmacy for medication refills o Patient to call [facility] benefit regarding size large blood pressure monitor cuff . Calls provider office for new concerns or questions . Patient to check blood sugar as directed by Endocrinologist and keep log  Please see past updates related to this goal by clicking on the "Past Updates" button in the selected goal         The patient verbalized understanding of instructions provided today and declined a print copy of patient instruction materials.   Telephone follow up appointment with care management team member scheduled for: 9/30 at 2 pm  Harlow Asa, PharmD, [place] [phone]

## 2019-01-25 NOTE — Chronic Care Management (AMB) (Signed)
Chronic Care Management   Follow Up Note   01/25/2019 Name: [name] MRN: [id number] DOB: 11-06-1965  Referred by: [name], DO Reason for referral : Chronic Care Management (Phone Call to reach patient)   [name] is a 53 y.o. year old female who is a primary care patient of [name], DO. The CCM team was consulted for assistance with chronic disease management and care coordination needs.  [name] has a past medical history including but not limited to type 2 diabetes, hypertension and hyperlipidemia. [name] was hospitalized from 8/11-8/17 for osteomyelitis of left calcaneal, discharged from hospital to [facility].  Was unable to reach patient via telephone today. Home phone number has been disconnected and unable to leave message on mobile phone number as voicemail box is full.   Place a call to the emergency contact listed in chart, [name] (patient's daughter).   Review of patient status, including review of consultants reports, relevant laboratory and other test results, and collaboration with appropriate care team members and the patient's provider was performed as part of comprehensive patient evaluation and provision of chronic care management services.     Outpatient Encounter Medications as of 01/25/2019  Medication Sig Note  . amLODipine (NORVASC) 5 MG tablet Take 1 tablet (5 mg total) by mouth daily.   . ARIPiprazole (ABILIFY) 10 MG tablet Take 10 mg by mouth daily.   [name] atorvastatin (LIPITOR) 40 MG tablet Take 40 mg by mouth daily.   . B-D UF III MINI PEN NEEDLES 31G X 5 MM MISC USE AS DIRECTED   . Blood Glucose Monitoring Suppl (ONETOUCH VERIO) w/Device KIT Use to check blood sugar up to 3 x daily   . buPROPion (WELLBUTRIN XL) 150 MG 24 hr tablet Take 150 mg by mouth daily. Take in AM   . HUMALOG KWIKPEN 100 UNIT/ML KwikPen Inject 0.05 mLs (5 Units total) into the skin  3 (three) times daily with meals. + Sliding scale   . Insulin Glargine (LANTUS SOLOSTAR) 100 UNIT/ML Solostar Pen Inject 40 Units into the skin daily. Adjust dose as advised   . Lancets (ONETOUCH ULTRASOFT) lancets Use to check blood sugar up to 3 x daily   . lisinopril (PRINIVIL,ZESTRIL) 20 MG tablet TAKE 1 TABLET(20 MG) BY MOUTH DAILY (Patient taking differently: Take 20 mg by mouth daily. )   . metFORMIN (GLUCOPHAGE) 1000 MG tablet TAKE 1 TABLET(1000 MG) BY MOUTH TWICE DAILY WITH A MEAL   . ONETOUCH VERIO test strip Check blood sugar up to 3 x daily as needed   . PARoxetine (PAXIL) 20 MG tablet Take 1 tablet (20 mg total) by mouth daily. Prescribed by [facility] 12/11/2018: Patient will take last dose of Paxil on 8/12   No facility-administered encounter medications on file as of 01/25/2019.     Goals Addressed            This Visit's Progress   . PharmD - Medication Management       Current Barriers:  . Financial Barriers . Non adherence to prescribed medication regimen  Pharmacist Clinical Goal(s):  [name] Over the next 30 days, patient will work with [name] to address needs related to optimization of medication regimen and improvement of medication adherence  Interventions: . Unable to reach Ms. Asleson. . Place a call to the emergency contact listed [name] (patient's daughter). Daughter states patient has been discharged back home from [facility] facility. Reports  that she has taken over managing patient's medications (using weekly pillbox organizer) and takes patient to her appointments.  . Schedule appointment to complete medication review with patient and daughter. . Will collaborate with CM Nurse Case Manager and Social Worker to advise patient has been discharged home.  Patient Self Care Activities:  . Self administers medications as prescribed o Using weekly pillbox and calendar as adherence tools . Attends all scheduled provider appointments . Calls  pharmacy for medication refills o Patient to call [facility] benefit regarding size large blood pressure monitor cuff . Calls provider office for new concerns or questions . Patient to check blood sugar as directed by Endocrinologist and keep log  Please see past updates related to this goal by clicking on the "Past Updates" button in the selected goal         Plan  Telephone follow up appointment with care management team member scheduled for: 9/30 at 2 pm to complete medication review  Harlow Asa, PharmD, [place] [phone]

## 2019-01-28 DIAGNOSIS — M86172 Other acute osteomyelitis, left ankle and foot: Secondary | ICD-10-CM | POA: Diagnosis not present

## 2019-01-28 DIAGNOSIS — E1169 Type 2 diabetes mellitus with other specified complication: Secondary | ICD-10-CM | POA: Diagnosis not present

## 2019-01-28 DIAGNOSIS — E1159 Type 2 diabetes mellitus with other circulatory complications: Secondary | ICD-10-CM | POA: Diagnosis not present

## 2019-01-28 DIAGNOSIS — E1129 Type 2 diabetes mellitus with other diabetic kidney complication: Secondary | ICD-10-CM | POA: Diagnosis not present

## 2019-01-28 DIAGNOSIS — E785 Hyperlipidemia, unspecified: Secondary | ICD-10-CM | POA: Diagnosis not present

## 2019-01-28 DIAGNOSIS — E1165 Type 2 diabetes mellitus with hyperglycemia: Secondary | ICD-10-CM | POA: Diagnosis not present

## 2019-01-28 DIAGNOSIS — Z794 Long term (current) use of insulin: Secondary | ICD-10-CM | POA: Diagnosis not present

## 2019-01-28 DIAGNOSIS — I10 Essential (primary) hypertension: Secondary | ICD-10-CM | POA: Diagnosis not present

## 2019-01-28 DIAGNOSIS — G709 Myoneural disorder, unspecified: Secondary | ICD-10-CM | POA: Diagnosis not present

## 2019-01-28 DIAGNOSIS — E1142 Type 2 diabetes mellitus with diabetic polyneuropathy: Secondary | ICD-10-CM | POA: Diagnosis not present

## 2019-01-28 DIAGNOSIS — Z9181 History of falling: Secondary | ICD-10-CM | POA: Diagnosis not present

## 2019-01-28 DIAGNOSIS — E782 Mixed hyperlipidemia: Secondary | ICD-10-CM | POA: Diagnosis not present

## 2019-01-28 DIAGNOSIS — M2041 Other hammer toe(s) (acquired), right foot: Secondary | ICD-10-CM | POA: Diagnosis not present

## 2019-01-29 ENCOUNTER — Telehealth: Payer: Self-pay | Admitting: Family Medicine

## 2019-01-29 DIAGNOSIS — R899 Unspecified abnormal finding in specimens from other organs, systems and tissues: Secondary | ICD-10-CM | POA: Diagnosis not present

## 2019-01-29 NOTE — Telephone Encounter (Signed)
I called the patient to schedule AWV with Tiffany, but there was no answer. VDM (DD)

## 2019-01-30 ENCOUNTER — Ambulatory Visit (INDEPENDENT_AMBULATORY_CARE_PROVIDER_SITE_OTHER): Payer: Medicare Other | Admitting: Pharmacist

## 2019-01-30 DIAGNOSIS — E875 Hyperkalemia: Secondary | ICD-10-CM | POA: Diagnosis not present

## 2019-01-30 DIAGNOSIS — E1165 Type 2 diabetes mellitus with hyperglycemia: Secondary | ICD-10-CM | POA: Diagnosis not present

## 2019-01-30 DIAGNOSIS — IMO0002 Reserved for concepts with insufficient information to code with codable children: Secondary | ICD-10-CM

## 2019-01-30 DIAGNOSIS — I10 Essential (primary) hypertension: Secondary | ICD-10-CM | POA: Diagnosis not present

## 2019-01-30 DIAGNOSIS — L97522 Non-pressure chronic ulcer of other part of left foot with fat layer exposed: Secondary | ICD-10-CM | POA: Diagnosis not present

## 2019-01-30 DIAGNOSIS — M86272 Subacute osteomyelitis, left ankle and foot: Secondary | ICD-10-CM | POA: Diagnosis not present

## 2019-01-30 DIAGNOSIS — E1142 Type 2 diabetes mellitus with diabetic polyneuropathy: Secondary | ICD-10-CM | POA: Diagnosis not present

## 2019-01-30 DIAGNOSIS — E1149 Type 2 diabetes mellitus with other diabetic neurological complication: Secondary | ICD-10-CM

## 2019-01-30 NOTE — Patient Instructions (Signed)
Thank you allowing the Chronic Care Management Team to be a part of your care! It was a pleasure speaking with you today!     CCM (Chronic Care Management) Team    Gabriela Minor RN, BSN Nurse Care Coordinator  [phone]   [name] PharmD  Clinical Pharmacist  [phone]   Gabriela Fried LCSW Clinical Social Worker [phone]  Visit Information  Goals Addressed            This Visit's Progress   . PharmD - Medication Management       Current Barriers:  . Financial Barriers . Non adherence to prescribed medication regimen  Pharmacist Clinical Goal(s):  [name] Kitchen Over the next 30 days, patient will work with [name] to address needs related to optimization of medication regimen and improvement of medication adherence  Interventions: . Perform chart review . Coordination of care call to Endocrinologist office for clarification of directions on patient's Lantus and Humalog.  . Comprehensive medication review performed with patient and daughter/caregiver; medication list updated in electronic medical record o [name] reports taking Lantus 45 units daily and Humalog 8 units three times daily with meals - Review with patient and daughter insulin instructions as directed by Endocrinologist. Patient and daughter to follow up with Endocrinologist regarding blood sugar results and diabetes medication management o Patient and daughter confirm patient currently holding lisinopril as directed by Endocrinologist . Update patient's contact information in chart as requested.  [name] on importance of adherence o Reports that her daughter has been managing her medications for her, filling her weekly pillbox and checking behind patient to confirm doses taken. Reports daughter comes 3-4 times/week and takes her to medical appointments . Counsel on importance of blood sugar control and adherence o Reports had not been taking mealtime Humalog consistently since discharge  from rehab, but denies missing dose since visit with Endocrinologist o Reports morning CBG in 280s today . Will mail patient a blood sugar monitoring log as requested by patient. [name] on importance of blood pressure monitoring, particularly as patient is currently off of lisinopril (due to potassium level - see above) o Reports BP currently being monitored by [name], who comes three times/week. Next scheduled to come: 10/1 o Remind patient/daughter to follow up with PCP office as needed regarding blood pressure . Note [name] has put a call into Endocrinologist for follow up regarding instructions based on today's potassium lab work.  . Will collaborate with CM Nurse Case Manager and Social Worker  Patient Self Care Activities:  . Self administers medications as prescribed o Using weekly pillbox and calendar as adherence tools . Attends all scheduled provider appointments o Next PCP appointment: 10/6 o Next Podiatry appointment: 10/7 o Next Endocrinology appointment: 11/11 . Calls pharmacy for medication refills . Calls provider office for new concerns or questions . Patient to check blood sugar as directed by Endocrinologist and keep log  Please see past updates related to this goal by clicking on the "Past Updates" button in the selected goal         The patient verbalized understanding of instructions provided today and declined a print copy of patient instruction materials.   The care management team will reach out to the patient again over the next 14 days.   [name], PharmD, [place] [employer] [phone]

## 2019-01-30 NOTE — Chronic Care Management (AMB) (Signed)
Chronic Care Management   Follow Up Note   01/30/2019 Name: [name] MRN: [id number] DOB: [birth year]  Referred by: [name], DO Reason for referral : No chief complaint on file.   [name] is a 53 y.o. year old female who is a primary care patient of [name], DO. The CCM team was consulted for assistance with chronic disease management and care coordination needs.  [name] has a past medical history including but not limited to type 2 diabetes, hypertension and hyperlipidemia. [name] was hospitalized from 8/11-8/17 for osteomyelitis of left calcaneal, discharged from hospital to [facility].  I reached out to [name] by phone today. Patient reports that she was discharge from [facility] about 2 weeks ago. [name] gives permission for me to speak with her daughter, [name], who she reports currently manages her medications.  Review of patient status, including review of consultants reports, relevant laboratory and other test results, and collaboration with appropriate care team members and the patient's provider was performed as part of comprehensive patient evaluation and provision of chronic care management services.     Outpatient Encounter Medications as of 01/30/2019  Medication Sig  . amLODipine (NORVASC) 5 MG tablet Take 1 tablet (5 mg total) by mouth daily.  . ARIPiprazole (ABILIFY) 10 MG tablet Take 10 mg by mouth daily.  [name] atorvastatin (LIPITOR) 40 MG tablet Take 40 mg by mouth daily.  [name] buPROPion (WELLBUTRIN XL) 150 MG 24 hr tablet Take 150 mg by mouth daily. Take in AM  . HUMALOG KWIKPEN 100 UNIT/ML KwikPen Inject 0.05 mLs (5 Units total) into the skin 3 (three) times daily with meals. + Sliding scale (Patient taking differently: Inject 8 Units into the skin 3 (three) times daily with meals. + Sliding scale)  . Insulin Glargine (LANTUS  SOLOSTAR) 100 UNIT/ML Solostar Pen Inject 40 Units into the skin daily. Adjust dose as advised (Patient taking differently: Inject 45 Units into the skin daily. )  . metFORMIN (GLUCOPHAGE) 1000 MG tablet TAKE 1 TABLET(1000 MG) BY MOUTH TWICE DAILY WITH A MEAL  . PARoxetine (PAXIL) 20 MG tablet Take 1 tablet (20 mg total) by mouth daily. Prescribed by [facility]  . B-D UF III MINI PEN NEEDLES 31G X 5 MM MISC USE AS DIRECTED  . Blood Glucose Monitoring Suppl (ONETOUCH VERIO) w/Device KIT Use to check blood sugar up to 3 x daily  . Lancets (ONETOUCH ULTRASOFT) lancets Use to check blood sugar up to 3 x daily  . lisinopril (PRINIVIL,ZESTRIL) 20 MG tablet TAKE 1 TABLET(20 MG) BY MOUTH DAILY (Patient not taking: No sig reported)  . ONETOUCH VERIO test strip Check blood sugar up to 3 x daily as needed   No facility-administered encounter medications on file as of 01/30/2019.     Goals Addressed            This Visit's Progress   . PharmD - Medication Management       Current Barriers:  . Financial Barriers . Non adherence to prescribed medication regimen  Pharmacist Clinical Goal(s):  [name] Over the next 30 days, patient will work with [name] to address needs related to optimization of medication regimen and improvement of medication adherence  Interventions: . Perform chart review o Patient seen by Endocrinologist on 9/28. Per patient instructions, patient to: "- Continue Metformin 1000 mg twice daily - Continue Lantus 45 units each evening before bed  -  Continue Humalog 8 units with meals plus sliding scale" . In plan section of note, provider indicates, patient to continue Lantus 40 units daily and Humalog 5 units with meals plus sliding scale  o Patient's 9/28 potassium lab result was 6.5 o Endocrinologist ordered Kayexalate for patient to take on 9/28 . Repeat potassium lab on 9/29: 5.6 o Patient instructed to repeat Kayexalate on 9/29 o Instructed to stop lisinopril .  Repeat potassium lab on 9/30: 5.0 . Patient seen by Podiatry today, 9/30 . Coordination of care call to Endocrinologist office for clarification of directions on patient's Lantus and Humalog.  o Per [name], CMA with [name], patient to take Lantus 45 units daily and Humalog 8 units with meals plus sliding scale, in addition to metformin, as indicated in patient instructions portion of note . Comprehensive medication review performed with patient and daughter/caregiver; medication list updated in electronic medical record o [name] reports taking Lantus 45 units daily and Humalog 8 units three times daily with meals - Review with patient and daughter insulin instructions as directed by Endocrinologist. Patient and daughter to follow up with Endocrinologist regarding blood sugar results and diabetes medication management o Patient and daughter confirm patient currently holding lisinopril as directed by Endocrinologist . Note per patient and daughter, psychiatric medications managed by provider at [facility] . Update patient's contact information in chart as requested. Reports previous home phone number was disconnected while she was in hospital. New home phone number provided by [name]: [phone] (Home). Asks that this be her preferred contact number. [name] on importance of adherence o Reports that her daughter has been managing her medications for her, filling her weekly pillbox and checking behind patient to confirm doses taken. Reports daughter comes 3-4 times/week and takes her to medical appointments . Counsel on importance of blood sugar control and adherence o Reports had not been taking mealtime Humalog consistently since discharge from rehab, but denies missing dose since visit with Endocrinologist o Reports morning CBG in 280s today . Will mail patient a blood sugar monitoring log as requested by patient. [name] on importance of blood pressure monitoring,  particularly as patient is currently off of lisinopril (due to potassium level - see above) o Reports BP currently being monitored by [facility] RN, who comes three times/week. Next scheduled to come: 10/1 o Remind patient/daughter to follow up with PCP office as needed regarding blood pressure . Note Ms. Kuchenbecker has put a call into Endocrinologist for follow up regarding instructions based on today's potassium lab work.  . Will collaborate with CM Nurse Case Manager and Social Worker  Patient Self Care Activities:  . Self administers medications as prescribed o Using weekly pillbox and calendar as adherence tools . Attends all scheduled provider appointments o Next PCP appointment: 10/6 o Next Podiatry appointment: 10/7 o Next Endocrinology appointment: 11/11 . Calls pharmacy for medication refills . Calls provider office for new concerns or questions . Patient to check blood sugar as directed by Endocrinologist and keep log  Please see past updates related to this goal by clicking on the "Past Updates" button in the selected goal         Plan  The care management team will reach out to the patient again over the next 14 days.   Harlow Asa, PharmD, [place] [employer] [phone]

## 2019-02-01 DIAGNOSIS — E785 Hyperlipidemia, unspecified: Secondary | ICD-10-CM | POA: Diagnosis not present

## 2019-02-01 DIAGNOSIS — Z9181 History of falling: Secondary | ICD-10-CM | POA: Diagnosis not present

## 2019-02-01 DIAGNOSIS — E1169 Type 2 diabetes mellitus with other specified complication: Secondary | ICD-10-CM | POA: Diagnosis not present

## 2019-02-01 DIAGNOSIS — I10 Essential (primary) hypertension: Secondary | ICD-10-CM | POA: Diagnosis not present

## 2019-02-01 DIAGNOSIS — M2041 Other hammer toe(s) (acquired), right foot: Secondary | ICD-10-CM | POA: Diagnosis not present

## 2019-02-01 DIAGNOSIS — G709 Myoneural disorder, unspecified: Secondary | ICD-10-CM | POA: Diagnosis not present

## 2019-02-01 DIAGNOSIS — M86172 Other acute osteomyelitis, left ankle and foot: Secondary | ICD-10-CM | POA: Diagnosis not present

## 2019-02-01 DIAGNOSIS — E1142 Type 2 diabetes mellitus with diabetic polyneuropathy: Secondary | ICD-10-CM | POA: Diagnosis not present

## 2019-02-04 ENCOUNTER — Ambulatory Visit (INDEPENDENT_AMBULATORY_CARE_PROVIDER_SITE_OTHER): Payer: Medicare Other | Admitting: *Deleted

## 2019-02-04 DIAGNOSIS — E1165 Type 2 diabetes mellitus with hyperglycemia: Secondary | ICD-10-CM | POA: Diagnosis not present

## 2019-02-04 DIAGNOSIS — E1169 Type 2 diabetes mellitus with other specified complication: Secondary | ICD-10-CM | POA: Diagnosis not present

## 2019-02-04 DIAGNOSIS — I10 Essential (primary) hypertension: Secondary | ICD-10-CM | POA: Diagnosis not present

## 2019-02-04 DIAGNOSIS — G709 Myoneural disorder, unspecified: Secondary | ICD-10-CM | POA: Diagnosis not present

## 2019-02-04 DIAGNOSIS — E1149 Type 2 diabetes mellitus with other diabetic neurological complication: Secondary | ICD-10-CM

## 2019-02-04 DIAGNOSIS — Z9181 History of falling: Secondary | ICD-10-CM | POA: Diagnosis not present

## 2019-02-04 DIAGNOSIS — L97429 Non-pressure chronic ulcer of left heel and midfoot with unspecified severity: Secondary | ICD-10-CM | POA: Diagnosis not present

## 2019-02-04 DIAGNOSIS — E1142 Type 2 diabetes mellitus with diabetic polyneuropathy: Secondary | ICD-10-CM | POA: Diagnosis not present

## 2019-02-04 DIAGNOSIS — E11621 Type 2 diabetes mellitus with foot ulcer: Secondary | ICD-10-CM | POA: Diagnosis not present

## 2019-02-04 DIAGNOSIS — M86172 Other acute osteomyelitis, left ankle and foot: Secondary | ICD-10-CM | POA: Diagnosis not present

## 2019-02-04 DIAGNOSIS — M7989 Other specified soft tissue disorders: Secondary | ICD-10-CM

## 2019-02-04 DIAGNOSIS — IMO0002 Reserved for concepts with insufficient information to code with codable children: Secondary | ICD-10-CM

## 2019-02-04 DIAGNOSIS — M2041 Other hammer toe(s) (acquired), right foot: Secondary | ICD-10-CM | POA: Diagnosis not present

## 2019-02-04 DIAGNOSIS — E785 Hyperlipidemia, unspecified: Secondary | ICD-10-CM | POA: Diagnosis not present

## 2019-02-04 DIAGNOSIS — M79604 Pain in right leg: Secondary | ICD-10-CM

## 2019-02-04 NOTE — Patient Instructions (Signed)
Thank you allowing the Chronic Care Management Team to be a part of your care! It was a pleasure speaking with you today!   CCM (Chronic Care Management) Team   Keita Valley RN, BSN Nurse Care Coordinator  [phone]  Harlow Asa PharmD  Clinical Pharmacist  [phone]  [name] LCSW Clinical Social Worker [phone]  Goals Addressed            This Visit's Progress   . RNCM- I want to get my diabetes back on track (pt-stated)       Current Barriers:  [name] Knowledge Deficits related to basic Diabetes pathophysiology and self care/management . Knowledge Deficits related to medications used for management of diabetes . Does not use cbg meter   Case Manager Clinical Goal(s):  Over the next 90 days, patient will demonstrate improved adherence to prescribed treatment plan for diabetes self care/management as evidenced by:  . daily monitoring and recording of CBG  . adherence to ADA/ carb modified diet . exercise 3 days/week . adherence to prescribed medication regimen  Interventions:  . Patient has been home from SNF for approx. 3 weeks.  . Patient reports she has [facility] nursing and PT.  [name] Patient reporting getting a shower is very difficult for her related to NWB to Left foot because of wound healing.- Plan to ask PCP if OT could be added for bathing techniques. . Patient stated since she has been home she is trying to follow a good diet but her fasting sugars remain over 200. Patient reports "I eat because I am bored and all alone all day"  . Encouraged patient to keep a food diary until she sees the Nutritionist on 10/12.  . Patient has f/u with the Endocrinologist 11/11 . Reviewed all upcoming appointments: PCP 10/6, Podiatry 10/7 and Dietician 10/12. Patient states her daughter is her transportation.  . Patient reports she is living alone presently with her daughter Grayland Ormond as her main support. Her mother who has dementia is staying with her bother and  sister.  . Patient reports she is taking her insulin as ordered and is able to verbalize correct doses.    Patient Self Care Activities:  . UNABLE to independently self manage diabetes as evidenced by A1C >12  Please see past updates related to this goal by clicking on the "Past Updates" button in the selected goal         The patient verbalized understanding of instructions provided today and declined a print copy of patient instruction materials.   The patient has been provided with contact information for the care management team and has been advised to call with any health related questions or concerns.

## 2019-02-04 NOTE — Chronic Care Management (AMB) (Signed)
Chronic Care Management   Follow Up Note   02/04/2019 Name: [name] MRN: [id number] DOB: [birth year]  Referred by: [name], DO Reason for referral : Chronic Care Management (DM/Foot Ulcer )   [name] is a 53 y.o. year old female who is a primary care patient of [name], DO. The CCM team was consulted for assistance with chronic disease management and care coordination needs.    Review of patient status, including review of consultants reports, relevant laboratory and other test results, and collaboration with appropriate care team members and the patient's provider was performed as part of comprehensive patient evaluation and provision of chronic care management services.    SDOH (Social Determinants of Health) screening performed today: Transportation Social Connections Physical Activity. See Care Plan for related entries.   Advanced Directives Status: N See Care Plan and Vynca application for related entries.  Outpatient Encounter Medications as of 02/04/2019  Medication Sig  . amLODipine (NORVASC) 5 MG tablet Take 1 tablet (5 mg total) by mouth daily.  . ARIPiprazole (ABILIFY) 10 MG tablet Take 10 mg by mouth daily.  [name] atorvastatin (LIPITOR) 40 MG tablet Take 40 mg by mouth daily.  . B-D UF III MINI PEN NEEDLES 31G X 5 MM MISC USE AS DIRECTED  . Blood Glucose Monitoring Suppl (ONETOUCH VERIO) w/Device KIT Use to check blood sugar up to 3 x daily  . buPROPion (WELLBUTRIN XL) 150 MG 24 hr tablet Take 150 mg by mouth daily. Take in AM  . HUMALOG KWIKPEN 100 UNIT/ML KwikPen Inject 0.05 mLs (5 Units total) into the skin 3 (three) times daily with meals. + Sliding scale (Patient taking differently: Inject 8 Units into the skin 3 (three) times daily with meals. + Sliding scale)  . Insulin Glargine (LANTUS SOLOSTAR) 100 UNIT/ML Solostar Pen Inject 40 Units into the skin daily. Adjust dose as advised (Patient taking differently: Inject 45  Units into the skin daily. )  . Lancets (ONETOUCH ULTRASOFT) lancets Use to check blood sugar up to 3 x daily  . lisinopril (PRINIVIL,ZESTRIL) 20 MG tablet TAKE 1 TABLET(20 MG) BY MOUTH DAILY (Patient not taking: No sig reported)  . metFORMIN (GLUCOPHAGE) 1000 MG tablet TAKE 1 TABLET(1000 MG) BY MOUTH TWICE DAILY WITH A MEAL  . ONETOUCH VERIO test strip Check blood sugar up to 3 x daily as needed  . PARoxetine (PAXIL) 20 MG tablet Take 1 tablet (20 mg total) by mouth daily. Prescribed by [facility]   No facility-administered encounter medications on file as of 02/04/2019.      Goals Addressed            This Visit's Progress   . RNCM- I want to get my diabetes back on track (pt-stated)       Current Barriers:  [name] Knowledge Deficits related to basic Diabetes pathophysiology and self care/management . Knowledge Deficits related to medications used for management of diabetes . Does not use cbg meter   Case Manager Clinical Goal(s):  Over the next 90 days, patient will demonstrate improved adherence to prescribed treatment plan for diabetes self care/management as evidenced by:  . daily monitoring and recording of CBG  . adherence to ADA/ carb modified diet . exercise 3 days/week . adherence to prescribed medication regimen  Interventions:  . Patient has been home from SNF for approx. 3 weeks.  . Patient reports she has [place] nursing and PT.  [name] Patient reporting getting a shower is very difficult for  her related to NWB to foot because of wound healing.- Plan to ask PCP if OT could be added for bathing techniques. . Patient stated since she has been home she is trying to follow a good diet but her fasting sugars remain over 200. Patient reports "I eat because I am bored and all alone all day"  . Encouraged patient to keep a food diary until she sees the Nutritionist on 10/12.  . Patient has f/u with the Endocrinologist 11/11 . Reviewed all upcoming appointments: PCP 10/6, Podiatry 10/7  and Dietician 10/12. Patient states her daughter is her transportation.  . Patient reports she is living alone presently with her daughter [name] as her main support. Her mother who has dementia is staying with her bother and sister.  . Patient reports she is taking her insulin as ordered and is able to verbalize correct doses.    Patient Self Care Activities:  . UNABLE to independently self manage diabetes as evidenced by A1C >12  Please see past updates related to this goal by clicking on the "Past Updates" button in the selected goal          The care management team will reach out to the patient again over the next 30 days.  The patient has been provided with contact information for the care management team and has been advised to call with any health related questions or concerns.    [name] [name] RN, BSN Nurse Case Pharmacist, [facility]  [phone]) Business Mobile

## 2019-02-05 ENCOUNTER — Ambulatory Visit (INDEPENDENT_AMBULATORY_CARE_PROVIDER_SITE_OTHER): Payer: Medicare Other | Admitting: Family Medicine

## 2019-02-05 ENCOUNTER — Encounter: Payer: Self-pay | Admitting: Family Medicine

## 2019-02-05 ENCOUNTER — Other Ambulatory Visit: Payer: Self-pay

## 2019-02-05 VITALS — BP 131/77 | HR 99 | Temp 98.3°F | Resp 20 | Ht 70.0 in | Wt 257.0 lb

## 2019-02-05 DIAGNOSIS — F251 Schizoaffective disorder, depressive type: Secondary | ICD-10-CM

## 2019-02-05 DIAGNOSIS — E875 Hyperkalemia: Secondary | ICD-10-CM

## 2019-02-05 DIAGNOSIS — E1149 Type 2 diabetes mellitus with other diabetic neurological complication: Secondary | ICD-10-CM | POA: Diagnosis not present

## 2019-02-05 DIAGNOSIS — Z23 Encounter for immunization: Secondary | ICD-10-CM

## 2019-02-05 DIAGNOSIS — L97522 Non-pressure chronic ulcer of other part of left foot with fat layer exposed: Secondary | ICD-10-CM | POA: Diagnosis not present

## 2019-02-05 DIAGNOSIS — IMO0002 Reserved for concepts with insufficient information to code with codable children: Secondary | ICD-10-CM

## 2019-02-05 DIAGNOSIS — I10 Essential (primary) hypertension: Secondary | ICD-10-CM | POA: Diagnosis not present

## 2019-02-05 DIAGNOSIS — Z1231 Encounter for screening mammogram for malignant neoplasm of breast: Secondary | ICD-10-CM

## 2019-02-05 DIAGNOSIS — E1165 Type 2 diabetes mellitus with hyperglycemia: Secondary | ICD-10-CM

## 2019-02-05 MED ORDER — LISINOPRIL 10 MG PO TABS: 10.0000 mg | ORAL_TABLET | Freq: Every day | ORAL | 3 refills | Status: DC

## 2019-02-05 NOTE — Progress Notes (Signed)
Subjective:    Patient ID: [name], female    DOB: December 26, 1965, 53 y.o.   MRN: [id number]  [name] is a 53 y.o. female presenting on 02/05/2019 for Diabetes  History provided by both patient and daughter, [name].  HPI   CHRONIC DM, Type 2 uncontrolled / DM Foot Ulcer / DM Neuropathy Hyperkalemia Morbid Obesity BMI >36 Recent course has followed by [facility] for diabetic ulcer Left foot, has been treated for osteo and debridement by podiatry Dr Luana Shu, 12/13/18 had left heel medial incision and drainage and debridement/bone biopsy. Has improved with surgical shoe and off weight bearing, and continues to use Nushield on wound dressing, changed weekly. Next visit tomorrow. - She has chronic neuropathy with reduced sensation in feet - Last A1c 10.9 (12/14/18) - [facility] - on current insulin regimen with improvement when adheres to meds, could not proceed with SSI, now has daughter [name] helping her, and also Harlow [facility] [place] has been in contact to help improve her adherence. CBGs: Avg 200s, Low >150, High < 300. Checks CBGs daily Meds: Metformin 1000mg  BID, Lantus 45u nightly, Humalog 8u TID WC, not on SSI now Reports Improving compliance. Tolerating well w/o side-effects Currently on ACEi - was HOLDING Lisinopril 20mg  due to HyperK - now recent labs showed improved K normalized, and due today for repeat K check - Additionally daughter has advised that she is calling patient to keep regular check on CBGs and she has goal to move her to a senior living house in [place] in future Lifestyle: - Diet (admits poor higher carb sugar diet, needs to keep improving)  - Exercise (limited) Denies hypoglycemia, polyuria, visual changes, numbness or tingling.  Additional complaints  - Request for add on [facility] OT to help with transition in and out of bathtub/shower, and patient has a slight phobia with that requesting assistance.  Orders placed.  - Admits poor sleep insomnia. She is followed by Psychiatry on Abilify, Wellbutrin newly added by psych and Paxil. She has some symptoms of mania and difficulty slowing down mind at night cannot sleep   Health Maintenance: Due for mammogram - last 207, orders placed  Due for Updated DM Eye exam, asked to schedule w/ Dr Ellin Mayhew  Due for Flu vaccine, will get today  Depression screen [facility] 2/9 02/05/2019 10/26/2018 07/27/2018  Decreased Interest 0 0 0  Down, Depressed, Hopeless 0 0 0  PHQ - 2 Score 0 0 0  Altered sleeping 3 1 -  Tired, decreased energy 0 1 -  Change in appetite 0 0 -  Feeling bad or failure about yourself  0 0 -  Trouble concentrating 0 0 -  Moving slowly or fidgety/restless 0 0 -  Suicidal thoughts 0 0 -  PHQ-9 Score 3 2 -  Difficult doing work/chores - Not difficult at all -    Social History   Tobacco Use  . Smoking status: Former Smoker    Packs/day: 3.00    Years: 30.00    Pack years: 90.00    Types: Cigarettes    Quit date: 05/02/2009    Years since quitting: 9.7  . Smokeless tobacco: Former Systems developer  . Tobacco comment: Pt reported  quitting in 2011  Substance Use Topics  . Alcohol use: No  . Drug use: No    Review of Systems Per HPI unless specifically indicated above     Objective:    BP 131/77 (BP Location: Left Arm, Patient Position: Sitting,  Cuff Size: Normal)   Pulse 99   Temp 98.3 F (36.8 C) (Oral)   Resp 20   Ht 5\' 10"  (1.778 m)   Wt 257 lb (116.6 kg)   SpO2 100%   BMI 36.88 kg/m   Wt Readings from Last 3 Encounters:  02/05/19 257 lb (116.6 kg)  12/15/18 260 lb (117.9 kg)  11/30/18 271 lb (122.9 kg)    Physical Exam Vitals signs and nursing note reviewed.  Constitutional:      General: She is not in acute distress.    Appearance: She is well-developed. She is not diaphoretic.     Comments: Well-appearing, comfortable, cooperative, obese  HENT:     Head: Normocephalic and atraumatic.  Eyes:     General:         Right eye: No discharge.        Left eye: No discharge.     Conjunctiva/sclera: Conjunctivae normal.  Neck:     Musculoskeletal: Normal range of motion and neck supple.     Thyroid: No thyromegaly.  Cardiovascular:     Rate and Rhythm: Normal rate and regular rhythm.     Heart sounds: Normal heart sounds. No murmur.  Pulmonary:     Effort: Pulmonary effort is normal. No respiratory distress.     Breath sounds: Normal breath sounds. No wheezing or rales.  Lymphadenopathy:     Cervical: No cervical adenopathy.  Skin:    General: Skin is warm and dry.     Findings: No erythema or rash.     Comments: Left Foot wrapped with dressings.  Neurological:     Mental Status: She is alert and oriented to person, place, and time.  Psychiatric:        Behavior: Behavior normal.     Comments: Well groomed, good eye contact, normal speech and thoughts      Diabetic Foot Exam - Simple   Simple Foot Form Diabetic Foot exam was performed with the following findings: Yes 02/05/2019 11:44 AM  Visual Inspection See comments: Yes Sensation Testing See comments: Yes Pulse Check Posterior Tibialis and Dorsalis pulse intact bilaterally: Yes Comments Callus formation, mild scab sore on left ankle. Overgrown toenails. Reduced or absent monofilament sensation feet bilateral. L foot with ulceration healing.     Results for orders placed or performed during the hospital encounter of 12/11/18  Urine culture   Specimen: Urine, Random  Result Value Ref Range   Specimen Description      URINE, RANDOM Performed at [facility], [address] [place], [place] [address]    Special Requests      NONE Performed at [facility], [address], [place], [place] [address]    Culture (A)     <10,000 COLONIES/mL INSIGNIFICANT GROWTH Performed at [facility] [address], [place], [place] [address]    Report Status 12/12/2018 FINAL   SARS Coronavirus 2 [facility] order,  Performed in [facility] hospital lab)  Result Value Ref Range   SARS Coronavirus 2 NEGATIVE NEGATIVE  CULTURE, BLOOD (ROUTINE X 2) w Reflex to ID Panel   Specimen: BLOOD  Result Value Ref Range   Specimen Description BLOOD LEFT ANTECUBITAL    Special Requests      BOTTLES DRAWN AEROBIC AND ANAEROBIC Blood Culture results may not be optimal due to an excessive volume of blood received in culture bottles   Culture      NO GROWTH 5 DAYS Performed at [facility],  [place], [place] [address]    Report Status 12/16/2018 FINAL   CULTURE, BLOOD (ROUTINE X 2) w Reflex to ID Panel   Specimen: BLOOD  Result Value Ref Range   Specimen Description BLOOD BLOOD RIGHT HAND    Special Requests      BOTTLES DRAWN AEROBIC AND ANAEROBIC Blood Culture adequate volume   Culture      NO GROWTH 5 DAYS Performed at [facility], [address] [place], [place] [address]    Report Status 12/16/2018 FINAL   MRSA PCR Screening   Specimen: Nasal Mucosa; Nasopharyngeal  Result Value Ref Range   MRSA by PCR NEGATIVE NEGATIVE  Aerobic Culture (superficial specimen)   Specimen: Wound  Result Value Ref Range   Specimen Description      WOUND Performed at [facility], [address], [place], [place] [address]    Special Requests      Normal Performed at [facility], [place], [place] [address]    Gram Stain      NO WBC SEEN ABUNDANT GRAM NEGATIVE RODS MODERATE GRAM POSITIVE COCCI MODERATE GRAM NEGATIVE COCCOBACILLI RARE GRAM POSITIVE RODS    Culture      MODERATE STAPHYLOCOCCUS [place] Performed at [facility], [place] [address] [place], [place] [address]    Report Status 12/15/2018 FINAL    Organism ID, Bacteria STAPHYLOCOCCUS AUREUS       Susceptibility   Staphylococcus aureus - MIC*    CIPROFLOXACIN >=8 RESISTANT Resistant     ERYTHROMYCIN <=0.25 SENSITIVE Sensitive     GENTAMICIN <=0.5  SENSITIVE Sensitive     OXACILLIN <=0.25 SENSITIVE Sensitive     TETRACYCLINE <=1 SENSITIVE Sensitive     VANCOMYCIN 1 SENSITIVE Sensitive     TRIMETH/SULFA <=10 SENSITIVE Sensitive     CLINDAMYCIN <=0.25 SENSITIVE Sensitive     RIFAMPIN <=0.5 SENSITIVE Sensitive     Inducible Clindamycin NEGATIVE Sensitive     * MODERATE STAPHYLOCOCCUS AUREUS  Aerobic/Anaerobic Culture (surgical/deep wound)   Specimen: PATH Other; Tissue  Result Value Ref Range   Specimen Description      KNEE Performed at [facility], [address] [place], [place] [address]    Special Requests      LEFT KNEE ABSCESS Performed at [facility], [address] [place], [place] [address]    Gram Stain      NO WBC SEEN MODERATE GRAM POSITIVE COCCI RARE GRAM NEGATIVE RODS    Culture      MODERATE BACTEROIDES VULGATUS MODERATE MORGANELLA MORGANII MODERATE KLEBSIELLA OXYTOCA FEW STREPTOCOCCUS MITIS/ORALIS BETA LACTAMASE POSITIVE FOR BACTEROIDES VULGATUS Performed at [facility], [place] [address] [place], [place] [address]    Report Status 12/18/2018 FINAL    Organism ID, Bacteria MORGANELLA MORGANII    Organism ID, Bacteria KLEBSIELLA OXYTOCA    Organism ID, Bacteria STREPTOCOCCUS MITIS/ORALIS       Susceptibility   Klebsiella oxytoca - MIC*    AMPICILLIN >=32 RESISTANT Resistant     CEFAZOLIN 8 SENSITIVE Sensitive     CEFEPIME <=1 SENSITIVE Sensitive     CEFTAZIDIME <=1 SENSITIVE Sensitive     CEFTRIAXONE <=1 SENSITIVE Sensitive     CIPROFLOXACIN <=0.25 SENSITIVE Sensitive     GENTAMICIN <=1 SENSITIVE Sensitive     IMIPENEM <=0.25 SENSITIVE Sensitive     TRIMETH/SULFA <=20 SENSITIVE Sensitive     AMPICILLIN/SULBACTAM 16 INTERMEDIATE Intermediate     PIP/TAZO <=  4 SENSITIVE Sensitive     Extended ESBL NEGATIVE Sensitive     * MODERATE KLEBSIELLA OXYTOCA   Morganella morganii - MIC*    AMPICILLIN >=32 RESISTANT Resistant     CEFAZOLIN >=64 RESISTANT Resistant     CEFEPIME  <=1 SENSITIVE Sensitive     CEFTAZIDIME <=1 SENSITIVE Sensitive     CEFTRIAXONE <=1 SENSITIVE Sensitive     CIPROFLOXACIN <=0.25 SENSITIVE Sensitive     GENTAMICIN <=1 SENSITIVE Sensitive     IMIPENEM 4 SENSITIVE Sensitive     TRIMETH/SULFA <=20 SENSITIVE Sensitive     AMPICILLIN/SULBACTAM 16 INTERMEDIATE Intermediate     PIP/TAZO <=4 SENSITIVE Sensitive     * MODERATE MORGANELLA MORGANII   Streptococcus mitis/oralis - MIC*    TETRACYCLINE 4 SENSITIVE Sensitive     VANCOMYCIN 0.5 SENSITIVE Sensitive     CLINDAMYCIN <=0.25 SENSITIVE Sensitive     PENICILLIN SENSITIVE Sensitive     CEFTRIAXONE SENSITIVE Sensitive     * FEW STREPTOCOCCUS MITIS/ORALIS  Aerobic/Anaerobic Culture (surgical/deep wound)   Specimen: PATH Other; Tissue  Result Value Ref Range   Specimen Description      BONE Performed at [facility], [address], [place], [place] [address]    Special Requests      LEFT CALCANEOUS BONE Performed at [facility], [address] [place], [place] [address]    Gram Stain NO WBC SEEN NO ORGANISMS SEEN     Culture      RARE STAPHYLOCOCCUS AUREUS RARE ENTEROCOCCUS FAECALIS CRITICAL RESULT CALLED TO, READ BACK BY AND VERIFIED WITH: R DUNN RN, AT 1013 12/15/18 BY D. VANHOOK NO ANAEROBES ISOLATED Performed at [facility], [place] [address], [place], [place] [address]    Report Status 12/19/2018 FINAL    Organism ID, Bacteria STAPHYLOCOCCUS AUREUS    Organism ID, Bacteria ENTEROCOCCUS FAECALIS       Susceptibility   Enterococcus faecalis - MIC*    AMPICILLIN <=2 SENSITIVE Sensitive     VANCOMYCIN 1 SENSITIVE Sensitive     GENTAMICIN SYNERGY RESISTANT Resistant     * RARE ENTEROCOCCUS FAECALIS   Staphylococcus aureus - MIC*    CIPROFLOXACIN >=8 RESISTANT Resistant     ERYTHROMYCIN <=0.25 SENSITIVE Sensitive     GENTAMICIN <=0.5 SENSITIVE Sensitive     OXACILLIN 0.5 SENSITIVE Sensitive     TETRACYCLINE <=1 SENSITIVE Sensitive     VANCOMYCIN 1  SENSITIVE Sensitive     TRIMETH/SULFA <=10 SENSITIVE Sensitive     CLINDAMYCIN <=0.25 SENSITIVE Sensitive     RIFAMPIN <=0.5 SENSITIVE Sensitive     Inducible Clindamycin NEGATIVE Sensitive     * RARE STAPHYLOCOCCUS AUREUS  SARS Coronavirus 2 (Hospital order, Performed in [place] hospital lab) Nasopharyngeal Nasopharyngeal Swab   Specimen: Nasopharyngeal Swab  Result Value Ref Range   SARS Coronavirus 2 NEGATIVE NEGATIVE  SARS Coronavirus 2 (Hospital order, Performed in [place] hospital lab) Nasopharyngeal Nasopharyngeal Swab   Specimen: Nasopharyngeal Swab  Result Value Ref Range   SARS Coronavirus 2 NEGATIVE NEGATIVE  CBC WITH DIFFERENTIAL  Result Value Ref Range   WBC 12.2 (H) 4.0 - 10.5 K/uL   RBC 4.02 3.87 - 5.11 MIL/uL   Hemoglobin 10.4 (L) 12.0 - 15.0 g/dL   HCT 32.4 (L) 36.0 - 46.0 %   MCV 80.6 80.0 - 100.0 fL   MCH 25.9 (L) 26.0 - 34.0 pg   MCHC 32.1 30.0 - 36.0 g/dL   RDW 16.0 (H) 11.5 - 15.5 %  Platelets 331 150 - 400 K/uL   nRBC 0.0 0.0 - 0.2 %   Neutrophils Relative % 66 %   Neutro Abs 8.2 (H) 1.7 - 7.7 K/uL   Lymphocytes Relative 23 %   Lymphs Abs 2.7 0.7 - 4.0 K/uL   Monocytes Relative 7 %   Monocytes Absolute 0.8 0.1 - 1.0 K/uL   Eosinophils Relative 2 %   Eosinophils Absolute 0.3 0.0 - 0.5 K/uL   Basophils Relative 1 %   Basophils Absolute 0.1 0.0 - 0.1 K/uL   Immature Granulocytes 1 %   Abs Immature Granulocytes 0.07 0.00 - 0.07 K/uL  APTT  Result Value Ref Range   aPTT 39 (H) 24 - 36 seconds  Protime-INR  Result Value Ref Range   Prothrombin Time 12.7 11.4 - 15.2 seconds   INR 1.0 0.8 - 1.2  Comprehensive metabolic panel  Result Value Ref Range   Sodium 128 (L) 135 - 145 mmol/L   Potassium 5.1 3.5 - 5.1 mmol/L   Chloride 96 (L) 98 - 111 mmol/L   CO2 22 22 - 32 mmol/L   Glucose, Bld 105 (H) 70 - 99 mg/dL   BUN 17 6 - 20 mg/dL   Creatinine, Ser 1.02 (H) 0.44 - 1.00 mg/dL   Calcium 9.3 8.9 - 10.3 mg/dL   Total Protein 7.8 6.5 - 8.1 g/dL    Albumin 3.9 3.5 - 5.0 g/dL   AST 20 15 - 41 U/L   ALT 22 0 - 44 U/L   Alkaline Phosphatase 113 38 - 126 U/L   Total Bilirubin 0.2 (L) 0.3 - 1.2 mg/dL   GFR calc non Af Amer >60 >60 mL/min   GFR calc Af Amer >60 >60 mL/min   Anion gap 10 5 - 15  Glucose, capillary  Result Value Ref Range   Glucose-Capillary 107 (H) 70 - 99 mg/dL   Comment 1 Notify RN   Basic metabolic panel  Result Value Ref Range   Sodium 128 (L) 135 - 145 mmol/L   Potassium 5.0 3.5 - 5.1 mmol/L   Chloride 98 98 - 111 mmol/L   CO2 23 22 - 32 mmol/L   Glucose, Bld 157 (H) 70 - 99 mg/dL   BUN 17 6 - 20 mg/dL   Creatinine, Ser 1.02 (H) 0.44 - 1.00 mg/dL   Calcium 9.1 8.9 - 10.3 mg/dL   GFR calc non Af Amer >60 >60 mL/min   GFR calc Af Amer >60 >60 mL/min   Anion gap 7 5 - 15  CBC  Result Value Ref Range   WBC 10.9 (H) 4.0 - 10.5 K/uL   RBC 3.85 (L) 3.87 - 5.11 MIL/uL   Hemoglobin 10.0 (L) 12.0 - 15.0 g/dL   HCT 31.5 (L) 36.0 - 46.0 %   MCV 81.8 80.0 - 100.0 fL   MCH 26.0 26.0 - 34.0 pg   MCHC 31.7 30.0 - 36.0 g/dL   RDW 16.2 (H) 11.5 - 15.5 %   Platelets 304 150 - 400 K/uL   nRBC 0.0 0.0 - 0.2 %  Sedimentation rate  Result Value Ref Range   Sed Rate 70 (H) 0 - 30 mm/hr  C-reactive protein  Result Value Ref Range   CRP 1.0 (H) <1.0 mg/dL  Glucose, capillary  Result Value Ref Range   Glucose-Capillary 174 (H) 70 - 99 mg/dL  Glucose, capillary  Result Value Ref Range   Glucose-Capillary 163 (H) 70 - 99 mg/dL   Comment 1 Notify RN  Glucose, capillary  Result Value Ref Range   Glucose-Capillary 141 (H) 70 - 99 mg/dL   Comment 1 Notify RN   Glucose, capillary  Result Value Ref Range   Glucose-Capillary 124 (H) 70 - 99 mg/dL   Comment 1 Notify RN   Basic metabolic panel  Result Value Ref Range   Sodium 133 (L) 135 - 145 mmol/L   Potassium 4.8 3.5 - 5.1 mmol/L   Chloride 104 98 - 111 mmol/L   CO2 22 22 - 32 mmol/L   Glucose, Bld 156 (H) 70 - 99 mg/dL   BUN 15 6 - 20 mg/dL   Creatinine, Ser  1.02 (H) 0.44 - 1.00 mg/dL   Calcium 8.9 8.9 - 10.3 mg/dL   GFR calc non Af Amer >60 >60 mL/min   GFR calc Af Amer >60 >60 mL/min   Anion gap 7 5 - 15  Glucose, capillary  Result Value Ref Range   Glucose-Capillary 206 (H) 70 - 99 mg/dL  Glucose, capillary  Result Value Ref Range   Glucose-Capillary 171 (H) 70 - 99 mg/dL  Glucose, capillary  Result Value Ref Range   Glucose-Capillary 141 (H) 70 - 99 mg/dL  Glucose, capillary  Result Value Ref Range   Glucose-Capillary 149 (H) 70 - 99 mg/dL  Glucose, capillary  Result Value Ref Range   Glucose-Capillary 209 (H) 70 - 99 mg/dL  CBC  Result Value Ref Range   WBC 10.3 4.0 - 10.5 K/uL   RBC 3.67 (L) 3.87 - 5.11 MIL/uL   Hemoglobin 9.4 (L) 12.0 - 15.0 g/dL   HCT 30.8 (L) 36.0 - 46.0 %   MCV 83.9 80.0 - 100.0 fL   MCH 25.6 (L) 26.0 - 34.0 pg   MCHC 30.5 30.0 - 36.0 g/dL   RDW 16.5 (H) 11.5 - 15.5 %   Platelets 330 150 - 400 K/uL   nRBC 0.0 0.0 - 0.2 %  Basic metabolic panel  Result Value Ref Range   Sodium 134 (L) 135 - 145 mmol/L   Potassium 5.0 3.5 - 5.1 mmol/L   Chloride 104 98 - 111 mmol/L   CO2 23 22 - 32 mmol/L   Glucose, Bld 214 (H) 70 - 99 mg/dL   BUN 15 6 - 20 mg/dL   Creatinine, Ser 1.12 (H) 0.44 - 1.00 mg/dL   Calcium 9.1 8.9 - 10.3 mg/dL   GFR calc non Af Amer 56 (L) >60 mL/min   GFR calc Af Amer >60 >60 mL/min   Anion gap 7 5 - 15  Sedimentation rate  Result Value Ref Range   Sed Rate 92 (H) 0 - 30 mm/hr  Glucose, capillary  Result Value Ref Range   Glucose-Capillary 177 (H) 70 - 99 mg/dL  Glucose, capillary  Result Value Ref Range   Glucose-Capillary 170 (H) 70 - 99 mg/dL  Glucose, capillary  Result Value Ref Range   Glucose-Capillary 197 (H) 70 - 99 mg/dL  Hemoglobin A1c  Result Value Ref Range   Hgb A1c MFr Bld 10.9 (H) 4.8 - 5.6 %   Mean Plasma Glucose 266.13 mg/dL  Glucose, capillary  Result Value Ref Range   Glucose-Capillary 225 (H) 70 - 99 mg/dL  Basic metabolic panel  Result Value Ref  Range   Sodium 134 (L) 135 - 145 mmol/L   Potassium 4.8 3.5 - 5.1 mmol/L   Chloride 103 98 - 111 mmol/L   CO2 24 22 - 32 mmol/L   Glucose, Bld 215 (H) 70 -  99 mg/dL   BUN 16 6 - 20 mg/dL   Creatinine, Ser 0.92 0.44 - 1.00 mg/dL   Calcium 9.4 8.9 - 10.3 mg/dL   GFR calc non Af Amer >60 >60 mL/min   GFR calc Af Amer >60 >60 mL/min   Anion gap 7 5 - 15  Glucose, capillary  Result Value Ref Range   Glucose-Capillary 217 (H) 70 - 99 mg/dL  Glucose, capillary  Result Value Ref Range   Glucose-Capillary 194 (H) 70 - 99 mg/dL   Comment 1 Notify RN   Vancomycin, peak  Result Value Ref Range   Vancomycin Pk 23 (L) 30 - 40 ug/mL  Glucose, capillary  Result Value Ref Range   Glucose-Capillary 170 (H) 70 - 99 mg/dL   Comment 1 Notify RN   Creatinine, serum  Result Value Ref Range   Creatinine, Ser 0.95 0.44 - 1.00 mg/dL   GFR calc non Af Amer >60 >60 mL/min   GFR calc Af Amer >60 >60 mL/min  Glucose, capillary  Result Value Ref Range   Glucose-Capillary 240 (H) 70 - 99 mg/dL  Vancomycin, trough  Result Value Ref Range   Vancomycin Tr 15 15 - 20 ug/mL  Glucose, capillary  Result Value Ref Range   Glucose-Capillary 176 (H) 70 - 99 mg/dL  Glucose, capillary  Result Value Ref Range   Glucose-Capillary 210 (H) 70 - 99 mg/dL   Comment 1 Notify RN   Glucose, capillary  Result Value Ref Range   Glucose-Capillary 200 (H) 70 - 99 mg/dL   Comment 1 Notify RN   Creatinine, serum  Result Value Ref Range   Creatinine, Ser 0.86 0.44 - 1.00 mg/dL   GFR calc non Af Amer >60 >60 mL/min   GFR calc Af Amer >60 >60 mL/min  Glucose, capillary  Result Value Ref Range   Glucose-Capillary 212 (H) 70 - 99 mg/dL  Glucose, capillary  Result Value Ref Range   Glucose-Capillary 199 (H) 70 - 99 mg/dL  Glucose, capillary  Result Value Ref Range   Glucose-Capillary 203 (H) 70 - 99 mg/dL  HIV antibody (Routine Testing)  Result Value Ref Range   HIV Screen 4th Generation wRfx Non Reactive Non  Reactive  Glucose, capillary  Result Value Ref Range   Glucose-Capillary 226 (H) 70 - 99 mg/dL   Comment 1 Notify RN   Glucose, capillary  Result Value Ref Range   Glucose-Capillary 202 (H) 70 - 99 mg/dL  ABO/Rh  Result Value Ref Range   ABO/RH(D)      O NEG Performed at [facility], [address] [place], [place] [address]       Assessment & Plan:   Problem List Items Addressed This Visit    DM (diabetes mellitus), type 2, 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[place]) - Primary    Uncontrolled DM with hyperglycemia, A1c >10 previously 12/2018 Followed by [facility] Complications - peripheral neuropathy, DM ulcer foot, hyperlipidemia, bipolar/depression, obesity -  increases risk of future cardiovascular complications / poor glucose control due to reduced lifestyle diet/exercise with low energy mood and fatigue  Plan:  1. Continue current therapy - per Endocrinology - Metformin 1000mg  BID, Lantus 45u nightly, Humalog 8u TID WC 2. Encourage improved lifestyle - low carb, low sugar diet, reduce portion size, continue improving regular exercise 3. Check CBG , bring log to next visit for review 4. Continue ACEi Statin - restart Lisinopril now 10mg  dose (lowered from 20 due to HyperK) 5. DM Foot exam done  today / Advised to schedule DM ophtho exam, send record Dr Ellin Mayhew 6. Follow-up 4 months      Relevant Medications   lisinopril (ZESTRIL) 10 MG tablet   Essential hypertension    Controlled BP No complications from HTN Off Lisinopril 20mg  due to HyperK  Check BMET today since normalized, will repeat K RESTART Lisinopril - new dose 10mg  new rx sent      Relevant Medications   lisinopril (ZESTRIL) 10 MG tablet   Hyperkalemia    Check BMET today, previously normalized 1 week ago Restart Lisinopril lower dose at 10mg  now      Relevant Orders   BASIC METABOLIC PANEL WITH GFR   Morbid obesity (HCC)    Weight down 3 lbs in 2 months Admits some worse  lifestyle diet / limited exercise Morbid obesity based on co-morbidities with DM, HTN, HLD BMI >36 but < 40  Encouraged weight loss with lifestyle      Neuropathic foot ulcer, left, with fat layer exposed ([place])    Followed by [facility] Podiatry Dr Luana Shu Continue debridement / topical Nushield / dressings management Add OT with existing [facility] for home management and help with bathtub      Relevant Orders   Ambulatory referral to Occupational Therapy   Schizoaffective disorder ([place])    Mostly stable, followed by [facility] Has some worsening possible manic symptoms difficulty sleeping insomnia May be related to Wellbutrin  Advised she should discuss insomnia with Psychiatry next time consider med adjustment       Other Visit Diagnoses    Needs flu shot       Relevant Orders   Flu Vaccine QUAD 36+ mos IM (Completed)   Encounter for screening mammogram for malignant neoplasm of breast       Relevant Orders   MM DIGITAL SCREENING BILATERAL      Meds ordered this encounter  Medications  . lisinopril (ZESTRIL) 10 MG tablet    Sig: Take 1 tablet (10 mg total) by mouth daily.    Dispense:  90 tablet    Refill:  3    Dose reduced from 20 down to 10      Follow up plan: Return in about 4 months (around 06/08/2019) for 4 month DM Follow-up.   [name], DO [facility] 02/05/2019, 11:45 AM

## 2019-02-05 NOTE — Assessment & Plan Note (Signed)
Weight down 3 lbs in 2 months Admits some worse lifestyle diet / limited exercise Morbid obesity based on co-morbidities with DM, HTN, HLD BMI >36 but < 40  Encouraged weight loss with lifestyle

## 2019-02-05 NOTE — Assessment & Plan Note (Signed)
Uncontrolled DM with hyperglycemia, A1c >10 previously 12/2018 Followed by [facility] Complications - peripheral neuropathy, DM ulcer foot, hyperlipidemia, bipolar/depression, obesity -  increases risk of future cardiovascular complications / poor glucose control due to reduced lifestyle diet/exercise with low energy mood and fatigue  Plan:  1. Continue current therapy - per Endocrinology - Metformin 1000mg  BID, Lantus 45u nightly, Humalog 8u TID WC 2. Encourage improved lifestyle - low carb, low sugar diet, reduce portion size, continue improving regular exercise 3. Check CBG , bring log to next visit for review 4. Continue ACEi Statin - restart Lisinopril now 10mg  dose (lowered from 20 due to HyperK) 5. DM Foot exam done today / Advised to schedule DM ophtho exam, send record Dr Ellin Mayhew 6. Follow-up 4 months

## 2019-02-05 NOTE — Assessment & Plan Note (Signed)
Controlled BP No complications from HTN Off Lisinopril 20mg  due to HyperK  Check BMET today since normalized, will repeat K RESTART Lisinopril - new dose 10mg  new rx sent

## 2019-02-05 NOTE — Patient Instructions (Addendum)
Thank you for coming to the office today.   Blood today for potassium, call us if not heard back on result.  Restart Lisinopril - NEW RX lower dose 10mg  sent, can STOP the 20mg  tablets.  CONTINUE Atorvastatin for cholesterol.  ----------------------------------  For Sleep - Can try Melatonin 1mg  up to dose of 5mg , gradually increase. Max dose is 10mg . - Ask Psychiatry about Wellbutrin can cause you to stay awake, consider other medication in future, if it is due to bipolar or mania they can adjust medications  -----------------  For Mammogram screening for breast cancer   Call the [facility] below anytime to schedule your own appointment now that order has been placed.  [facility] [place], [place] [address] Phone: [phone]  ------------------------------------------------  Your provider would like to you have your annual eye exam. Please contact your current eye doctor or here are some good options for you to contact.   [facility]   Address: [address] [place], [place] [address] Phone: [phone]  Website: [email or web address] [address], [place], [place] [address] Phone: [phone] [email or web address]  [facility]  Address: [place], [place], [place] [address] Phone: [phone]   [facility] [address] [place], [place] [address] Phone: [phone]  [facility] Address: [place], [place],  [address]  Phone: [phone]   Please schedule a Follow-up Appointment to: Return in about 4 months (around 06/08/2019) for 4 month DM Follow-up.  If you have any other questions or concerns, please feel free to call the office or send a message through [place]. You may also schedule an earlier appointment if necessary.  Additionally, you may be receiving a survey about your experience at our office within a few  days to 1 week by e-mail or mail. We value your feedback.  [name], DO [employer]

## 2019-02-05 NOTE — Assessment & Plan Note (Signed)
Followed by [facility] Podiatry Dr Luana Shu Continue debridement / topical [place] / dressings management Add OT with existing [facility] for home management and help with bathtub

## 2019-02-05 NOTE — Assessment & Plan Note (Signed)
Mostly stable, followed by [facility] Psychiatry Has some worsening possible manic symptoms difficulty sleeping insomnia May be related to Wellbutrin  Advised she should discuss insomnia with Psychiatry next time consider med adjustment

## 2019-02-05 NOTE — Assessment & Plan Note (Signed)
Check BMET today, previously normalized 1 week ago Restart Lisinopril lower dose at 10mg  now

## 2019-02-06 DIAGNOSIS — E11621 Type 2 diabetes mellitus with foot ulcer: Secondary | ICD-10-CM | POA: Diagnosis not present

## 2019-02-06 DIAGNOSIS — L97422 Non-pressure chronic ulcer of left heel and midfoot with fat layer exposed: Secondary | ICD-10-CM | POA: Diagnosis not present

## 2019-02-06 DIAGNOSIS — M86272 Subacute osteomyelitis, left ankle and foot: Secondary | ICD-10-CM | POA: Diagnosis not present

## 2019-02-06 DIAGNOSIS — E1142 Type 2 diabetes mellitus with diabetic polyneuropathy: Secondary | ICD-10-CM | POA: Diagnosis not present

## 2019-02-06 LAB — BASIC METABOLIC PANEL WITH GFR
BUN/Creatinine Ratio: 17 (calc) (ref 6–22)
BUN: 20 mg/dL (ref 7–25)
CO2: 23 mmol/L (ref 20–32)
Calcium: 9.5 mg/dL (ref 8.6–10.4)
Chloride: 102 mmol/L (ref 98–110)
Creat: 1.17 mg/dL — ABNORMAL HIGH (ref 0.50–1.05)
GFR, Est African American: 61 mL/min/{1.73_m2} (ref >=60)
GFR, Est Non African American: 53 mL/min/{1.73_m2} — ABNORMAL LOW (ref >=60)
Glucose, Bld: 326 mg/dL — ABNORMAL HIGH (ref 65–99)
Potassium: 5.3 mmol/L (ref 3.5–5.3)
Sodium: 136 mmol/L (ref 135–146)

## 2019-02-07 DIAGNOSIS — M2041 Other hammer toe(s) (acquired), right foot: Secondary | ICD-10-CM | POA: Diagnosis not present

## 2019-02-07 DIAGNOSIS — E1142 Type 2 diabetes mellitus with diabetic polyneuropathy: Secondary | ICD-10-CM | POA: Diagnosis not present

## 2019-02-07 DIAGNOSIS — I10 Essential (primary) hypertension: Secondary | ICD-10-CM | POA: Diagnosis not present

## 2019-02-07 DIAGNOSIS — E1169 Type 2 diabetes mellitus with other specified complication: Secondary | ICD-10-CM | POA: Diagnosis not present

## 2019-02-07 DIAGNOSIS — G709 Myoneural disorder, unspecified: Secondary | ICD-10-CM | POA: Diagnosis not present

## 2019-02-07 DIAGNOSIS — M86172 Other acute osteomyelitis, left ankle and foot: Secondary | ICD-10-CM | POA: Diagnosis not present

## 2019-02-07 DIAGNOSIS — Z9181 History of falling: Secondary | ICD-10-CM | POA: Diagnosis not present

## 2019-02-07 DIAGNOSIS — E785 Hyperlipidemia, unspecified: Secondary | ICD-10-CM | POA: Diagnosis not present

## 2019-02-11 ENCOUNTER — Ambulatory Visit: Payer: Medicare Other | Admitting: Dietician

## 2019-02-11 DIAGNOSIS — G709 Myoneural disorder, unspecified: Secondary | ICD-10-CM | POA: Diagnosis not present

## 2019-02-11 DIAGNOSIS — Z9181 History of falling: Secondary | ICD-10-CM | POA: Diagnosis not present

## 2019-02-11 DIAGNOSIS — I10 Essential (primary) hypertension: Secondary | ICD-10-CM | POA: Diagnosis not present

## 2019-02-11 DIAGNOSIS — E785 Hyperlipidemia, unspecified: Secondary | ICD-10-CM | POA: Diagnosis not present

## 2019-02-11 DIAGNOSIS — M2041 Other hammer toe(s) (acquired), right foot: Secondary | ICD-10-CM | POA: Diagnosis not present

## 2019-02-11 DIAGNOSIS — E1142 Type 2 diabetes mellitus with diabetic polyneuropathy: Secondary | ICD-10-CM | POA: Diagnosis not present

## 2019-02-11 DIAGNOSIS — E1169 Type 2 diabetes mellitus with other specified complication: Secondary | ICD-10-CM | POA: Diagnosis not present

## 2019-02-11 DIAGNOSIS — M86172 Other acute osteomyelitis, left ankle and foot: Secondary | ICD-10-CM | POA: Diagnosis not present

## 2019-02-12 DIAGNOSIS — M2041 Other hammer toe(s) (acquired), right foot: Secondary | ICD-10-CM | POA: Diagnosis not present

## 2019-02-12 DIAGNOSIS — E1169 Type 2 diabetes mellitus with other specified complication: Secondary | ICD-10-CM | POA: Diagnosis not present

## 2019-02-12 DIAGNOSIS — Z9181 History of falling: Secondary | ICD-10-CM | POA: Diagnosis not present

## 2019-02-12 DIAGNOSIS — G709 Myoneural disorder, unspecified: Secondary | ICD-10-CM | POA: Diagnosis not present

## 2019-02-12 DIAGNOSIS — I10 Essential (primary) hypertension: Secondary | ICD-10-CM | POA: Diagnosis not present

## 2019-02-12 DIAGNOSIS — E785 Hyperlipidemia, unspecified: Secondary | ICD-10-CM | POA: Diagnosis not present

## 2019-02-12 DIAGNOSIS — M86172 Other acute osteomyelitis, left ankle and foot: Secondary | ICD-10-CM | POA: Diagnosis not present

## 2019-02-12 DIAGNOSIS — E1142 Type 2 diabetes mellitus with diabetic polyneuropathy: Secondary | ICD-10-CM | POA: Diagnosis not present

## 2019-02-13 ENCOUNTER — Other Ambulatory Visit: Payer: Self-pay

## 2019-02-13 ENCOUNTER — Telehealth: Payer: Self-pay

## 2019-02-13 ENCOUNTER — Ambulatory Visit: Payer: Self-pay | Admitting: Pharmacist

## 2019-02-13 DIAGNOSIS — Z9181 History of falling: Secondary | ICD-10-CM | POA: Diagnosis not present

## 2019-02-13 DIAGNOSIS — E1142 Type 2 diabetes mellitus with diabetic polyneuropathy: Secondary | ICD-10-CM | POA: Diagnosis not present

## 2019-02-13 DIAGNOSIS — M2041 Other hammer toe(s) (acquired), right foot: Secondary | ICD-10-CM | POA: Diagnosis not present

## 2019-02-13 DIAGNOSIS — G709 Myoneural disorder, unspecified: Secondary | ICD-10-CM | POA: Diagnosis not present

## 2019-02-13 DIAGNOSIS — M86172 Other acute osteomyelitis, left ankle and foot: Secondary | ICD-10-CM | POA: Diagnosis not present

## 2019-02-13 DIAGNOSIS — L97422 Non-pressure chronic ulcer of left heel and midfoot with fat layer exposed: Secondary | ICD-10-CM | POA: Diagnosis not present

## 2019-02-13 DIAGNOSIS — E11621 Type 2 diabetes mellitus with foot ulcer: Secondary | ICD-10-CM | POA: Diagnosis not present

## 2019-02-13 DIAGNOSIS — I10 Essential (primary) hypertension: Secondary | ICD-10-CM | POA: Diagnosis not present

## 2019-02-13 DIAGNOSIS — E785 Hyperlipidemia, unspecified: Secondary | ICD-10-CM | POA: Diagnosis not present

## 2019-02-13 DIAGNOSIS — E1169 Type 2 diabetes mellitus with other specified complication: Secondary | ICD-10-CM | POA: Diagnosis not present

## 2019-02-13 NOTE — Chronic Care Management (AMB) (Signed)
  Chronic Care Management   Follow Up Note   02/13/2019 Name: [name] MRN: [id number] DOB: 1965/05/07  Referred by: [name], DO Reason for referral : Chronic Care Management (Patient Phone Call)   [name] is a 53 y.o. year old female who is a primary care patient of [name], DO. The CCM team was consulted for assistance with chronic disease management and care coordination needs.    Was unable to reach patient via telephone today and have left HIPAA compliant voicemail asking patient to return my call.  Plan  The care management team will reach out to the patient again over the next 14 days.   Harlow Asa, PharmD, [place] [employer] [phone]

## 2019-02-13 NOTE — Patient Outreach (Signed)
[facility]) Care Management  02/13/2019  [name] [birth year] [id number]   Medication Adherence call to Mrs. [name] [facility] spoke with patient she is past due on Atorvastatin 40 mg patient explain she takes 1 tablet daily she has enough for a few days she ask to call [facility] to order this medication with metformin 1000 mg pharmacy said they have to call doctors office to renew it ,patient has enough for now. Mrs. [name] is showing past due under [place].   [facility] Management Direct Dial [phone]  Fax [phone] [email or web address]

## 2019-02-14 ENCOUNTER — Ambulatory Visit: Payer: Self-pay | Admitting: Licensed Clinical Social Worker

## 2019-02-14 ENCOUNTER — Telehealth: Payer: Self-pay

## 2019-02-14 NOTE — Chronic Care Management (AMB) (Signed)
  Care Management   Follow Up Note   02/14/2019 Name: MEGYN LENG MRN: [id number] DOB: 1965-08-03  Referred by: [name], DO Reason for referral : Care Coordination   JOELYNN DUST is a 53 y.o. year old female who is a primary care patient of [name], DO. The care management team was consulted for assistance with care management and care coordination needs.    Review of patient status, including review of consultants reports, relevant laboratory and other test results, and collaboration with appropriate care team members and the patient's provider was performed as part of comprehensive patient evaluation and provision of chronic care management services.  LCSW completed CCM outreach attempt today but was unable to reach patient successfully. A HIPPA compliant voice message was left encouraging patient to return call once available. LCSW rescheduled CCM SW appointment as well.  A HIPPA compliant phone message was left for the patient providing contact information and requesting a return call.   [name], BSW, MSW, [email or web address] Phone: [phone]

## 2019-02-18 DIAGNOSIS — M86172 Other acute osteomyelitis, left ankle and foot: Secondary | ICD-10-CM | POA: Diagnosis not present

## 2019-02-18 DIAGNOSIS — Z9181 History of falling: Secondary | ICD-10-CM | POA: Diagnosis not present

## 2019-02-18 DIAGNOSIS — E785 Hyperlipidemia, unspecified: Secondary | ICD-10-CM | POA: Diagnosis not present

## 2019-02-18 DIAGNOSIS — E11621 Type 2 diabetes mellitus with foot ulcer: Secondary | ICD-10-CM | POA: Diagnosis not present

## 2019-02-18 DIAGNOSIS — G709 Myoneural disorder, unspecified: Secondary | ICD-10-CM | POA: Diagnosis not present

## 2019-02-18 DIAGNOSIS — E1142 Type 2 diabetes mellitus with diabetic polyneuropathy: Secondary | ICD-10-CM | POA: Diagnosis not present

## 2019-02-18 DIAGNOSIS — M2041 Other hammer toe(s) (acquired), right foot: Secondary | ICD-10-CM | POA: Diagnosis not present

## 2019-02-18 DIAGNOSIS — E1169 Type 2 diabetes mellitus with other specified complication: Secondary | ICD-10-CM | POA: Diagnosis not present

## 2019-02-18 DIAGNOSIS — I10 Essential (primary) hypertension: Secondary | ICD-10-CM | POA: Diagnosis not present

## 2019-02-20 ENCOUNTER — Telehealth: Payer: Self-pay | Admitting: Family Medicine

## 2019-02-20 DIAGNOSIS — E1169 Type 2 diabetes mellitus with other specified complication: Secondary | ICD-10-CM

## 2019-02-20 DIAGNOSIS — E11621 Type 2 diabetes mellitus with foot ulcer: Secondary | ICD-10-CM | POA: Diagnosis not present

## 2019-02-20 DIAGNOSIS — I10 Essential (primary) hypertension: Secondary | ICD-10-CM | POA: Diagnosis not present

## 2019-02-20 DIAGNOSIS — M2041 Other hammer toe(s) (acquired), right foot: Secondary | ICD-10-CM | POA: Diagnosis not present

## 2019-02-20 DIAGNOSIS — E785 Hyperlipidemia, unspecified: Secondary | ICD-10-CM | POA: Diagnosis not present

## 2019-02-20 DIAGNOSIS — G709 Myoneural disorder, unspecified: Secondary | ICD-10-CM | POA: Diagnosis not present

## 2019-02-20 DIAGNOSIS — E1149 Type 2 diabetes mellitus with other diabetic neurological complication: Secondary | ICD-10-CM

## 2019-02-20 DIAGNOSIS — IMO0002 Reserved for concepts with insufficient information to code with codable children: Secondary | ICD-10-CM

## 2019-02-20 DIAGNOSIS — E1142 Type 2 diabetes mellitus with diabetic polyneuropathy: Secondary | ICD-10-CM | POA: Diagnosis not present

## 2019-02-20 DIAGNOSIS — L97422 Non-pressure chronic ulcer of left heel and midfoot with fat layer exposed: Secondary | ICD-10-CM | POA: Diagnosis not present

## 2019-02-20 DIAGNOSIS — M86172 Other acute osteomyelitis, left ankle and foot: Secondary | ICD-10-CM | POA: Diagnosis not present

## 2019-02-20 DIAGNOSIS — Z9181 History of falling: Secondary | ICD-10-CM | POA: Diagnosis not present

## 2019-02-20 MED ORDER — ATORVASTATIN CALCIUM 40 MG PO TABS: 40.0000 mg | ORAL_TABLET | Freq: Every day | ORAL | 3 refills | Status: DC

## 2019-02-20 MED ORDER — METFORMIN HCL 1000 MG PO TABS: 1000.0000 mg | ORAL_TABLET | Freq: Two times a day (BID) | ORAL | 3 refills | Status: DC

## 2019-02-20 NOTE — Telephone Encounter (Signed)
Pt  Called requesting refill on metformin, [facility]atin [facility]

## 2019-02-22 ENCOUNTER — Ambulatory Visit: Payer: Self-pay | Admitting: Pharmacist

## 2019-02-22 ENCOUNTER — Telehealth: Payer: Self-pay

## 2019-02-22 NOTE — Chronic Care Management (AMB) (Signed)
  Chronic Care Management   Follow Up Note   02/22/2019 Name: [name] MRN: [id number] DOB: 04/27/1966  Referred by: [name], DO Reason for referral : Chronic Care Management (Patient Phone Call)   [name] is a 53 y.o. year old female who is a primary care patient of [name], DO. The CCM team was consulted for assistance with chronic disease management and care coordination needs.    Was unable to reach patient via telephone today and have left HIPAA compliant voicemail asking patient to return my call.  Also place call to the emergency contact listed in chart, [name] (patient's daughter), but am unable to reach daughter or leave message as daughter's voicemail is not setup.   Plan  The care management team will reach out to the patient again over the next 14 days.   Harlow Asa, PharmD, [place] [employer] [phone]

## 2019-02-26 ENCOUNTER — Ambulatory Visit: Payer: Medicare Other | Admitting: *Deleted

## 2019-02-26 ENCOUNTER — Ambulatory Visit: Payer: Medicare Other

## 2019-02-27 DIAGNOSIS — Z9181 History of falling: Secondary | ICD-10-CM | POA: Diagnosis not present

## 2019-02-27 DIAGNOSIS — M86172 Other acute osteomyelitis, left ankle and foot: Secondary | ICD-10-CM | POA: Diagnosis not present

## 2019-02-27 DIAGNOSIS — L97312 Non-pressure chronic ulcer of right ankle with fat layer exposed: Secondary | ICD-10-CM | POA: Diagnosis not present

## 2019-02-27 DIAGNOSIS — E1169 Type 2 diabetes mellitus with other specified complication: Secondary | ICD-10-CM | POA: Diagnosis not present

## 2019-02-27 DIAGNOSIS — M2041 Other hammer toe(s) (acquired), right foot: Secondary | ICD-10-CM | POA: Diagnosis not present

## 2019-02-27 DIAGNOSIS — I10 Essential (primary) hypertension: Secondary | ICD-10-CM | POA: Diagnosis not present

## 2019-02-27 DIAGNOSIS — E785 Hyperlipidemia, unspecified: Secondary | ICD-10-CM | POA: Diagnosis not present

## 2019-02-27 DIAGNOSIS — B351 Tinea unguium: Secondary | ICD-10-CM | POA: Diagnosis not present

## 2019-02-27 DIAGNOSIS — L97422 Non-pressure chronic ulcer of left heel and midfoot with fat layer exposed: Secondary | ICD-10-CM | POA: Diagnosis not present

## 2019-02-27 DIAGNOSIS — G709 Myoneural disorder, unspecified: Secondary | ICD-10-CM | POA: Diagnosis not present

## 2019-02-27 DIAGNOSIS — E11621 Type 2 diabetes mellitus with foot ulcer: Secondary | ICD-10-CM | POA: Diagnosis not present

## 2019-02-27 DIAGNOSIS — E1142 Type 2 diabetes mellitus with diabetic polyneuropathy: Secondary | ICD-10-CM | POA: Diagnosis not present

## 2019-03-01 DIAGNOSIS — M2041 Other hammer toe(s) (acquired), right foot: Secondary | ICD-10-CM | POA: Diagnosis not present

## 2019-03-01 DIAGNOSIS — G709 Myoneural disorder, unspecified: Secondary | ICD-10-CM | POA: Diagnosis not present

## 2019-03-01 DIAGNOSIS — Z9181 History of falling: Secondary | ICD-10-CM | POA: Diagnosis not present

## 2019-03-01 DIAGNOSIS — I10 Essential (primary) hypertension: Secondary | ICD-10-CM | POA: Diagnosis not present

## 2019-03-01 DIAGNOSIS — M86172 Other acute osteomyelitis, left ankle and foot: Secondary | ICD-10-CM | POA: Diagnosis not present

## 2019-03-01 DIAGNOSIS — E785 Hyperlipidemia, unspecified: Secondary | ICD-10-CM | POA: Diagnosis not present

## 2019-03-01 DIAGNOSIS — E1169 Type 2 diabetes mellitus with other specified complication: Secondary | ICD-10-CM | POA: Diagnosis not present

## 2019-03-01 DIAGNOSIS — E1142 Type 2 diabetes mellitus with diabetic polyneuropathy: Secondary | ICD-10-CM | POA: Diagnosis not present

## 2019-03-04 DIAGNOSIS — M86172 Other acute osteomyelitis, left ankle and foot: Secondary | ICD-10-CM | POA: Diagnosis not present

## 2019-03-04 DIAGNOSIS — I10 Essential (primary) hypertension: Secondary | ICD-10-CM | POA: Diagnosis not present

## 2019-03-04 DIAGNOSIS — E1142 Type 2 diabetes mellitus with diabetic polyneuropathy: Secondary | ICD-10-CM | POA: Diagnosis not present

## 2019-03-04 DIAGNOSIS — E1169 Type 2 diabetes mellitus with other specified complication: Secondary | ICD-10-CM | POA: Diagnosis not present

## 2019-03-04 DIAGNOSIS — Z9181 History of falling: Secondary | ICD-10-CM | POA: Diagnosis not present

## 2019-03-04 DIAGNOSIS — M2041 Other hammer toe(s) (acquired), right foot: Secondary | ICD-10-CM | POA: Diagnosis not present

## 2019-03-04 DIAGNOSIS — G709 Myoneural disorder, unspecified: Secondary | ICD-10-CM | POA: Diagnosis not present

## 2019-03-04 DIAGNOSIS — E785 Hyperlipidemia, unspecified: Secondary | ICD-10-CM | POA: Diagnosis not present

## 2019-03-06 ENCOUNTER — Ambulatory Visit: Payer: Self-pay | Admitting: Pharmacist

## 2019-03-06 ENCOUNTER — Telehealth: Payer: Self-pay

## 2019-03-06 DIAGNOSIS — L97422 Non-pressure chronic ulcer of left heel and midfoot with fat layer exposed: Secondary | ICD-10-CM | POA: Diagnosis not present

## 2019-03-06 DIAGNOSIS — E1142 Type 2 diabetes mellitus with diabetic polyneuropathy: Secondary | ICD-10-CM | POA: Diagnosis not present

## 2019-03-06 DIAGNOSIS — E11621 Type 2 diabetes mellitus with foot ulcer: Secondary | ICD-10-CM | POA: Diagnosis not present

## 2019-03-06 DIAGNOSIS — L97312 Non-pressure chronic ulcer of right ankle with fat layer exposed: Secondary | ICD-10-CM | POA: Diagnosis not present

## 2019-03-06 NOTE — Chronic Care Management (AMB) (Addendum)
  Chronic Care Management   Follow Up Note   03/06/2019 Name: [name] MRN: [id number] DOB: 1965/07/23  Referred by: [name], DO Reason for referral : Chronic Care Management (Patient Phone Call)   [name] is a 53 y.o. year old female who is a primary care patient of [name], DO. The CCM team was consulted for assistance with chronic disease management and care coordination needs.    Was unable to reach patient via telephone today and have left HIPAA compliant voicemail asking patient to return my call. Outreach attempt #2  Also place call to the emergency contact listed in chart, [name] (patient's daughter), but am unable to reach daughter or leave message as daughter's voicemail is not setup.   Plan  The care management team will reach out to the patient again over the next 14 days.   Harlow Asa, PharmD, [place] [employer] [phone]

## 2019-03-08 DIAGNOSIS — E1169 Type 2 diabetes mellitus with other specified complication: Secondary | ICD-10-CM | POA: Diagnosis not present

## 2019-03-08 DIAGNOSIS — G709 Myoneural disorder, unspecified: Secondary | ICD-10-CM | POA: Diagnosis not present

## 2019-03-08 DIAGNOSIS — Z9181 History of falling: Secondary | ICD-10-CM | POA: Diagnosis not present

## 2019-03-08 DIAGNOSIS — E785 Hyperlipidemia, unspecified: Secondary | ICD-10-CM | POA: Diagnosis not present

## 2019-03-08 DIAGNOSIS — M86172 Other acute osteomyelitis, left ankle and foot: Secondary | ICD-10-CM | POA: Diagnosis not present

## 2019-03-08 DIAGNOSIS — I10 Essential (primary) hypertension: Secondary | ICD-10-CM | POA: Diagnosis not present

## 2019-03-08 DIAGNOSIS — M2041 Other hammer toe(s) (acquired), right foot: Secondary | ICD-10-CM | POA: Diagnosis not present

## 2019-03-08 DIAGNOSIS — E1142 Type 2 diabetes mellitus with diabetic polyneuropathy: Secondary | ICD-10-CM | POA: Diagnosis not present

## 2019-03-11 DIAGNOSIS — E1169 Type 2 diabetes mellitus with other specified complication: Secondary | ICD-10-CM | POA: Diagnosis not present

## 2019-03-11 DIAGNOSIS — G709 Myoneural disorder, unspecified: Secondary | ICD-10-CM | POA: Diagnosis not present

## 2019-03-11 DIAGNOSIS — M86172 Other acute osteomyelitis, left ankle and foot: Secondary | ICD-10-CM | POA: Diagnosis not present

## 2019-03-11 DIAGNOSIS — E785 Hyperlipidemia, unspecified: Secondary | ICD-10-CM | POA: Diagnosis not present

## 2019-03-11 DIAGNOSIS — Z9181 History of falling: Secondary | ICD-10-CM | POA: Diagnosis not present

## 2019-03-11 DIAGNOSIS — I10 Essential (primary) hypertension: Secondary | ICD-10-CM | POA: Diagnosis not present

## 2019-03-11 DIAGNOSIS — E1142 Type 2 diabetes mellitus with diabetic polyneuropathy: Secondary | ICD-10-CM | POA: Diagnosis not present

## 2019-03-11 DIAGNOSIS — M2041 Other hammer toe(s) (acquired), right foot: Secondary | ICD-10-CM | POA: Diagnosis not present

## 2019-03-13 DIAGNOSIS — L853 Xerosis cutis: Secondary | ICD-10-CM | POA: Diagnosis not present

## 2019-03-13 DIAGNOSIS — E11621 Type 2 diabetes mellitus with foot ulcer: Secondary | ICD-10-CM | POA: Diagnosis not present

## 2019-03-13 DIAGNOSIS — E1129 Type 2 diabetes mellitus with other diabetic kidney complication: Secondary | ICD-10-CM | POA: Diagnosis not present

## 2019-03-13 DIAGNOSIS — L97422 Non-pressure chronic ulcer of left heel and midfoot with fat layer exposed: Secondary | ICD-10-CM | POA: Diagnosis not present

## 2019-03-13 DIAGNOSIS — M14672 Charcot's joint, left ankle and foot: Secondary | ICD-10-CM | POA: Diagnosis not present

## 2019-03-13 DIAGNOSIS — E1159 Type 2 diabetes mellitus with other circulatory complications: Secondary | ICD-10-CM | POA: Diagnosis not present

## 2019-03-13 DIAGNOSIS — E782 Mixed hyperlipidemia: Secondary | ICD-10-CM | POA: Diagnosis not present

## 2019-03-13 DIAGNOSIS — E1165 Type 2 diabetes mellitus with hyperglycemia: Secondary | ICD-10-CM | POA: Diagnosis not present

## 2019-03-13 DIAGNOSIS — L97312 Non-pressure chronic ulcer of right ankle with fat layer exposed: Secondary | ICD-10-CM | POA: Diagnosis not present

## 2019-03-14 ENCOUNTER — Ambulatory Visit: Payer: Self-pay | Admitting: *Deleted

## 2019-03-14 ENCOUNTER — Telehealth: Payer: Self-pay

## 2019-03-14 NOTE — Chronic Care Management (AMB) (Signed)
  Chronic Care Management   Outreach Note  03/14/2019 Name: [name] MRN: [id number] DOB: 1965/08/29  Referred by: [name], DO Reason for referral : Chronic Care Management (Unsuccessful outreach )   An unsuccessful telephone outreach was attempted today. The patient was referred to the case management team by for assistance with care management and care coordination. Placed a call to patient and her daughter, unable to leave a voicemail on either number.   Follow Up Plan: The care management team will reach out to the patient again over the next 30 days.   [name] Aleene Swanner RN, BSN Nurse Case Pharmacist, [facility] Care [facility]  [phone]) Business Mobile

## 2019-03-15 DIAGNOSIS — Z9181 History of falling: Secondary | ICD-10-CM | POA: Diagnosis not present

## 2019-03-15 DIAGNOSIS — E785 Hyperlipidemia, unspecified: Secondary | ICD-10-CM | POA: Diagnosis not present

## 2019-03-15 DIAGNOSIS — M2041 Other hammer toe(s) (acquired), right foot: Secondary | ICD-10-CM | POA: Diagnosis not present

## 2019-03-15 DIAGNOSIS — E1169 Type 2 diabetes mellitus with other specified complication: Secondary | ICD-10-CM | POA: Diagnosis not present

## 2019-03-15 DIAGNOSIS — I10 Essential (primary) hypertension: Secondary | ICD-10-CM | POA: Diagnosis not present

## 2019-03-15 DIAGNOSIS — E1142 Type 2 diabetes mellitus with diabetic polyneuropathy: Secondary | ICD-10-CM | POA: Diagnosis not present

## 2019-03-15 DIAGNOSIS — M86172 Other acute osteomyelitis, left ankle and foot: Secondary | ICD-10-CM | POA: Diagnosis not present

## 2019-03-15 DIAGNOSIS — G709 Myoneural disorder, unspecified: Secondary | ICD-10-CM | POA: Diagnosis not present

## 2019-03-18 ENCOUNTER — Other Ambulatory Visit: Payer: Self-pay

## 2019-03-18 ENCOUNTER — Ambulatory Visit (INDEPENDENT_AMBULATORY_CARE_PROVIDER_SITE_OTHER): Payer: Medicare Other | Admitting: Family Medicine

## 2019-03-18 ENCOUNTER — Encounter: Payer: Self-pay | Admitting: Family Medicine

## 2019-03-18 VITALS — BP 141/76 | HR 105 | Temp 98.1°F | Ht 70.0 in | Wt 265.0 lb

## 2019-03-18 DIAGNOSIS — IMO0002 Reserved for concepts with insufficient information to code with codable children: Secondary | ICD-10-CM

## 2019-03-18 DIAGNOSIS — L97522 Non-pressure chronic ulcer of other part of left foot with fat layer exposed: Secondary | ICD-10-CM | POA: Diagnosis not present

## 2019-03-18 DIAGNOSIS — E1165 Type 2 diabetes mellitus with hyperglycemia: Secondary | ICD-10-CM | POA: Diagnosis not present

## 2019-03-18 DIAGNOSIS — E1149 Type 2 diabetes mellitus with other diabetic neurological complication: Secondary | ICD-10-CM | POA: Diagnosis not present

## 2019-03-18 DIAGNOSIS — M14672 Charcot's joint, left ankle and foot: Secondary | ICD-10-CM

## 2019-03-18 DIAGNOSIS — R296 Repeated falls: Secondary | ICD-10-CM

## 2019-03-18 MED ORDER — ONETOUCH VERIO VI STRP: ORAL_STRIP | 11 refills | Status: DC

## 2019-03-18 MED ORDER — ONETOUCH ULTRASOFT LANCETS MISC: 12 refills | Status: DC

## 2019-03-18 NOTE — Patient Instructions (Addendum)
Thank you for coming to the office today.  [place], [place] [address] Open until [place] Phone: [phone] - Diabetic Shoe Fitting / Orders - Print order form off CMS website, bring paper copy  When ready can go there with form to get shoes sized.  Ordered wheelchair from [employer] - stay tuned, takes up to 1 week processing before they can determine status of your order.   Please schedule a Follow-up Appointment to: Return if symptoms worsen or fail to improve, for as scheduled.  If you have any other questions or concerns, please feel free to call the office or send a message through [place]. You may also schedule an earlier appointment if necessary.  Additionally, you may be receiving a survey about your experience at our office within a few days to 1 week by e-mail or mail. We value your feedback.  [name], DO [place]

## 2019-03-18 NOTE — Progress Notes (Addendum)
Subjective:    Patient ID: [name], female    DOB: 1966/01/12, 53 y.o.   MRN: [id number]  [name] is a 53 y.o. female presenting on 03/18/2019 for Diabetes (The pt here for a face to face visit to get a order for a wheel chair. Diabetic ulcer of left heel associated with type 2 diabetes mellitus)   HPI   Wheelchair Mobility Assessment Charcot joint L foot, neuroarthropathy Type 2 Diabetes, uncontrolled, neuropathy Followed by [name] - [name], last seen 03/13/19, dx with Charcot L foot joint, given strict guidelines on non weight bearing to Left foot, she has had prior I&D to chronic ulceration on L foot, and has had multiple prior recurrent ulcers with complications of uncontrolled diabetes and Neuropathy in both feet. - Now she has cast and has been non weight bearing on Left foot. Difficulty with walker due to weight bearing limitations on L foot - She is requesting a manual wheelchair today. Wheelchair would increase mobility to different parts of house including to and from kitchen and bathroom to allow her to perform ADLs such as meal prep and cooking in kitchen and using bathroom - Considered fall risk while ambulating with other device with cane or walker, she has had multiple falls, has had difficulty due to lack of core muscle strength and balance to use these devices, has had PT documentation as well - She does have upper body strength to propel own wheelchair Admits occasional fall recently due to ambulation and using walker  Additionally request diabetic shoes Followed by [facility] Podiatry Followed by [facility] Endocrinology for her Diabetes.  Depression screen [facility] 2/9 02/05/2019 10/26/2018 07/27/2018  Decreased Interest 0 0 0  Down, Depressed, Hopeless 0 0 0  PHQ - 2 Score 0 0 0  Altered sleeping 3 1 -  Tired, decreased energy 0 1 -  Change in appetite 0 0 -  Feeling bad or failure about yourself  0 0 -  Trouble concentrating 0 0 -  Moving slowly  or fidgety/restless 0 0 -  Suicidal thoughts 0 0 -  PHQ-9 Score 3 2 -  Difficult doing work/chores - Not difficult at all -    Social History   Tobacco Use   Smoking status: Former Smoker    Packs/day: 3.00    Years: 30.00    Pack years: 90.00    Types: Cigarettes    Quit date: 05/02/2009    Years since quitting: 9.8   Smokeless tobacco: Former Systems developer   Tobacco comment: Pt reported  quitting in 2011  Substance Use Topics   Alcohol use: No   Drug use: No    Review of Systems Per HPI unless specifically indicated above     Objective:    BP (!) 141/76 (BP Location: Right Arm, Patient Position: Sitting, Cuff Size: Large)    Pulse (!) 105    Temp 98.1 F (36.7 C) (Oral)    Ht 5\' 10"  (1.778 m)    Wt 265 lb (120.2 kg)    BMI 38.02 kg/m   Wt Readings from Last 3 Encounters:  03/18/19 265 lb (120.2 kg)  02/05/19 257 lb (116.6 kg)  12/15/18 260 lb (117.9 kg)    Physical Exam Vitals signs and nursing note reviewed.  Constitutional:      General: She is not in acute distress.    Appearance: She is well-developed. She is not diaphoretic.     Comments: Well-appearing, comfortable, cooperative, obese  HENT:  Head: Normocephalic and atraumatic.  Eyes:     General:        Right eye: No discharge.        Left eye: No discharge.     Conjunctiva/sclera: Conjunctivae normal.  Neck:     Musculoskeletal: Normal range of motion and neck supple.     Thyroid: No thyromegaly.  Cardiovascular:     Rate and Rhythm: Normal rate and regular rhythm.     Heart sounds: Normal heart sounds. No murmur.  Pulmonary:     Effort: Pulmonary effort is normal. No respiratory distress.     Breath sounds: Normal breath sounds. No wheezing or rales.  Musculoskeletal:     Comments: Upper Extremity - Muscle Strength: intact 5/5 grip, slightly reduced 4/5 both biceps, triceps - Range of Motion: Mostly full active ROM  Lower Extremity - Muscle Strength: 5/5 knee extension and flexion, 5/5 intact  plantar and dorsiflexion feet/ankles, left hip flexion 5/5 and right hip 5/5 flexion, both hips 5/5 extension - Range of motion: Limited hip flexion and extension  Lymphadenopathy:     Cervical: No cervical adenopathy.  Skin:    General: Skin is warm and dry.     Findings: No erythema or rash.     Comments: Left Foot wrapped with dressings.  Neurological:     Mental Status: She is alert and oriented to person, place, and time.  Psychiatric:        Behavior: Behavior normal.     Comments: Well groomed, good eye contact, normal speech and thoughts     Diabetic Foot Exam - Simple   Simple Foot Form Diabetic Foot exam was performed with the following findings: Yes 03/18/2019  1:45 PM  Visual Inspection See comments: Yes Sensation Testing See comments: Yes Pulse Check Posterior Tibialis and Dorsalis pulse intact bilaterally: Yes Comments Reduced monofilament testing toes bilateral. Excessive dry skin with some ulceration L foot. Callus formation bilateral.     Results for orders placed or performed in visit on 57/01/77  BASIC METABOLIC PANEL WITH GFR  Result Value Ref Range   Glucose, Bld 326 (H) 65 - 99 mg/dL   BUN 20 7 - 25 mg/dL   Creat 1.17 (H) 0.50 - 1.05 mg/dL   GFR, Est Non African American 53 (L) > OR = 60 mL/min/1.18m2   GFR, Est African American 61 > OR = 60 mL/min/1.23m2   BUN/Creatinine Ratio 17 6 - 22 (calc)   Sodium 136 135 - 146 mmol/L   Potassium 5.3 3.5 - 5.3 mmol/L   Chloride 102 98 - 110 mmol/L   CO2 23 20 - 32 mmol/L   Calcium 9.5 8.6 - 10.4 mg/dL      Assessment & Plan:   Problem List Items Addressed This Visit    Neuropathic foot ulcer, left, with fat layer exposed ([place])   DM (diabetes mellitus), type 2, 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HCC)   Relevant Medications   ONETOUCH VERIO test strip   Lancets (ONETOUCH ULTRASOFT) lancets    Other Visit Diagnoses    Charcot's joint of left foot    -  Primary   Recurrent falls          Charcot  joint of Left foot, neuroarthropathy complication Secondary to uncontrolled diabetes and ulcerations / nerve damage Based on recent podiatry evaluation, she is now non weight-bearing for Left lower extremity to avoid future complication and deformity of Left foot  #Wheelchair See above HPI for data regarding her medical necessity for this  device  She would benefit from manual wheelchair with adjustable footrests to improve her mobility at home, allow her to perform ADLs, improve her function, reduce fall risk. She has limitations as documented with neuropathy of feet with charcot foot Left side with limited weight bearing and balance issues causing recurrent falls that limit her ability to ambulate with cane or walker.   #DM Shoes Chronic bilateral diabetic neuropathy in both feet, as complication of diabetes Recurrent falls when attempting to use walker/cane Recurrent chronic ulcer diabetic Charcot joint left foot  Completed DM Foot Exam today in office, . See exam note.  Plan - Proceed with ordering Diabetic Shoes, completed form today, will fax back to [place] along with this office note - Patient would benefit from Diabetic Shoes due to neuropathy with callus formation and chronic foot ulcer, diabetes control is improving on current regimen, and I am continuing to monitor and manage diabetes.   Meds ordered this encounter  Medications   ONETOUCH VERIO test strip    Sig: Check blood sugar up to 3 x daily as needed    Dispense:  100 each    Refill:  11    One Touch Verio, E11.49, E11.65   Lancets (ONETOUCH ULTRASOFT) lancets    Sig: Use to check blood sugar up to 3 x daily    Dispense:  100 each    Refill:  12    One Touch Verio, E11.49, E11.65      Follow up plan: Return if symptoms worsen or fail to improve, for as scheduled.  [name], DO [facility] 03/18/2019, 1:40 PM

## 2019-03-19 ENCOUNTER — Ambulatory Visit: Payer: Self-pay | Admitting: Pharmacist

## 2019-03-19 ENCOUNTER — Telehealth: Payer: Self-pay

## 2019-03-19 DIAGNOSIS — G709 Myoneural disorder, unspecified: Secondary | ICD-10-CM | POA: Diagnosis not present

## 2019-03-19 DIAGNOSIS — I10 Essential (primary) hypertension: Secondary | ICD-10-CM | POA: Diagnosis not present

## 2019-03-19 DIAGNOSIS — E785 Hyperlipidemia, unspecified: Secondary | ICD-10-CM | POA: Diagnosis not present

## 2019-03-19 DIAGNOSIS — M2041 Other hammer toe(s) (acquired), right foot: Secondary | ICD-10-CM | POA: Diagnosis not present

## 2019-03-19 DIAGNOSIS — Z9181 History of falling: Secondary | ICD-10-CM | POA: Diagnosis not present

## 2019-03-19 DIAGNOSIS — E1142 Type 2 diabetes mellitus with diabetic polyneuropathy: Secondary | ICD-10-CM | POA: Diagnosis not present

## 2019-03-19 NOTE — Chronic Care Management (AMB) (Signed)
  Chronic Care Management   Follow Up Note   03/19/2019 Name: [name] MRN: [id number] DOB: [birth year]  Referred by: [name], DO Reason for referral : Chronic Care Management (Patient Phone Call)   [name] is a 53 y.o. year old female who is a primary care patient of [name], DO. The CCM team was consulted for assistance with chronic disease management and care coordination needs.    Was unable to reach patient via telephone today. Unable to leave message on patient's home phone as no voicemail picks up and on mobile phone reach message that voicemail box is full. Outreach attempt #3   Plan  Will collaborate with CCM Nurse Case Manager and Social Worker who have future outreach to patient scheduled. Will request assistance with connecting with patient if/when CCM team is able to reach her.   Harlow Asa, PharmD, [place] [employer] [phone]

## 2019-03-20 DIAGNOSIS — L97312 Non-pressure chronic ulcer of right ankle with fat layer exposed: Secondary | ICD-10-CM | POA: Diagnosis not present

## 2019-03-20 DIAGNOSIS — L97422 Non-pressure chronic ulcer of left heel and midfoot with fat layer exposed: Secondary | ICD-10-CM | POA: Diagnosis not present

## 2019-03-20 DIAGNOSIS — E11621 Type 2 diabetes mellitus with foot ulcer: Secondary | ICD-10-CM | POA: Diagnosis not present

## 2019-03-21 DIAGNOSIS — I10 Essential (primary) hypertension: Secondary | ICD-10-CM | POA: Diagnosis not present

## 2019-03-21 DIAGNOSIS — G709 Myoneural disorder, unspecified: Secondary | ICD-10-CM | POA: Diagnosis not present

## 2019-03-21 DIAGNOSIS — M2041 Other hammer toe(s) (acquired), right foot: Secondary | ICD-10-CM | POA: Diagnosis not present

## 2019-03-21 DIAGNOSIS — E1142 Type 2 diabetes mellitus with diabetic polyneuropathy: Secondary | ICD-10-CM | POA: Diagnosis not present

## 2019-03-21 DIAGNOSIS — Z9181 History of falling: Secondary | ICD-10-CM | POA: Diagnosis not present

## 2019-03-21 DIAGNOSIS — E785 Hyperlipidemia, unspecified: Secondary | ICD-10-CM | POA: Diagnosis not present

## 2019-03-21 DIAGNOSIS — E11621 Type 2 diabetes mellitus with foot ulcer: Secondary | ICD-10-CM | POA: Diagnosis not present

## 2019-03-25 DIAGNOSIS — L97312 Non-pressure chronic ulcer of right ankle with fat layer exposed: Secondary | ICD-10-CM | POA: Diagnosis not present

## 2019-03-29 DIAGNOSIS — E1142 Type 2 diabetes mellitus with diabetic polyneuropathy: Secondary | ICD-10-CM | POA: Diagnosis not present

## 2019-03-29 DIAGNOSIS — Z9181 History of falling: Secondary | ICD-10-CM | POA: Diagnosis not present

## 2019-03-29 DIAGNOSIS — E785 Hyperlipidemia, unspecified: Secondary | ICD-10-CM | POA: Diagnosis not present

## 2019-03-29 DIAGNOSIS — G709 Myoneural disorder, unspecified: Secondary | ICD-10-CM | POA: Diagnosis not present

## 2019-03-29 DIAGNOSIS — M2041 Other hammer toe(s) (acquired), right foot: Secondary | ICD-10-CM | POA: Diagnosis not present

## 2019-03-29 DIAGNOSIS — I10 Essential (primary) hypertension: Secondary | ICD-10-CM | POA: Diagnosis not present

## 2019-04-02 ENCOUNTER — Telehealth: Payer: Self-pay | Admitting: Family Medicine

## 2019-04-02 DIAGNOSIS — Z9181 History of falling: Secondary | ICD-10-CM | POA: Diagnosis not present

## 2019-04-02 DIAGNOSIS — G709 Myoneural disorder, unspecified: Secondary | ICD-10-CM | POA: Diagnosis not present

## 2019-04-02 DIAGNOSIS — I10 Essential (primary) hypertension: Secondary | ICD-10-CM | POA: Diagnosis not present

## 2019-04-02 DIAGNOSIS — E1142 Type 2 diabetes mellitus with diabetic polyneuropathy: Secondary | ICD-10-CM | POA: Diagnosis not present

## 2019-04-02 DIAGNOSIS — E785 Hyperlipidemia, unspecified: Secondary | ICD-10-CM | POA: Diagnosis not present

## 2019-04-02 DIAGNOSIS — M2041 Other hammer toe(s) (acquired), right foot: Secondary | ICD-10-CM | POA: Diagnosis not present

## 2019-04-02 NOTE — Telephone Encounter (Signed)
I called both home and mobile numbers to reschedule AWV, but was unable to leave a message.

## 2019-04-02 NOTE — Telephone Encounter (Signed)
Handwritten rx Ready to fax  [name], [facility] 04/02/2019, 6:42 PM

## 2019-04-02 NOTE — Telephone Encounter (Signed)
Derrick with home health [phone] called said that pt have misplace order for wheelchair wanted to know if you would fax to [employer]  [phone] they have them in stock pt  Weight, height need to be on the order so they can fit pt for wheelchair.

## 2019-04-03 NOTE — Telephone Encounter (Signed)
The prescription was faxed.

## 2019-04-04 ENCOUNTER — Telehealth: Payer: Self-pay

## 2019-04-04 DIAGNOSIS — E1142 Type 2 diabetes mellitus with diabetic polyneuropathy: Secondary | ICD-10-CM | POA: Diagnosis not present

## 2019-04-04 DIAGNOSIS — G709 Myoneural disorder, unspecified: Secondary | ICD-10-CM | POA: Diagnosis not present

## 2019-04-04 DIAGNOSIS — M2041 Other hammer toe(s) (acquired), right foot: Secondary | ICD-10-CM | POA: Diagnosis not present

## 2019-04-04 DIAGNOSIS — I10 Essential (primary) hypertension: Secondary | ICD-10-CM | POA: Diagnosis not present

## 2019-04-04 DIAGNOSIS — M14672 Charcot's joint, left ankle and foot: Secondary | ICD-10-CM | POA: Diagnosis not present

## 2019-04-04 DIAGNOSIS — E785 Hyperlipidemia, unspecified: Secondary | ICD-10-CM | POA: Diagnosis not present

## 2019-04-04 DIAGNOSIS — Z9181 History of falling: Secondary | ICD-10-CM | POA: Diagnosis not present

## 2019-04-04 NOTE — Telephone Encounter (Signed)
[name] the patient's daughter called regarding her B/P reading taken by PT on Tuesday and Thursday, both of them were 186/110 mm/hg which doesn't match with her trends of bloods pressure reading. Patient is asymptomatic advised her when to seek immediate medical attention, Please suggest about her blood pressure.

## 2019-04-04 NOTE — Telephone Encounter (Signed)
Patient advised she will call back within week with log of Blood pressure reading and will take 2 tablet of lisinopril.

## 2019-04-04 NOTE — Telephone Encounter (Signed)
She may try doubling the dose of Lisinopril 10mg .   She can take x 2 pills = 20mg  Lisinopril once daily for now to see if that helps control her BP better.  They can call us back with BP readings within 1 week.  We can order new strength Lisinopril 20mg  once daily if they are interested.  [name], DO [facility] 04/04/2019, 4:25 PM

## 2019-04-05 ENCOUNTER — Other Ambulatory Visit: Payer: Self-pay

## 2019-04-05 DIAGNOSIS — L97312 Non-pressure chronic ulcer of right ankle with fat layer exposed: Secondary | ICD-10-CM | POA: Diagnosis not present

## 2019-04-06 ENCOUNTER — Emergency Department (HOSPITAL_COMMUNITY)
Admission: EM | Admit: 2019-04-06 | Discharge: 2019-04-06 | Disposition: A | Discharge status: Home / Self Care | Payer: Medicare Other

## 2019-04-08 DIAGNOSIS — I10 Essential (primary) hypertension: Secondary | ICD-10-CM | POA: Diagnosis not present

## 2019-04-08 DIAGNOSIS — G709 Myoneural disorder, unspecified: Secondary | ICD-10-CM | POA: Diagnosis not present

## 2019-04-08 DIAGNOSIS — Z9181 History of falling: Secondary | ICD-10-CM | POA: Diagnosis not present

## 2019-04-08 DIAGNOSIS — E785 Hyperlipidemia, unspecified: Secondary | ICD-10-CM | POA: Diagnosis not present

## 2019-04-08 DIAGNOSIS — M2041 Other hammer toe(s) (acquired), right foot: Secondary | ICD-10-CM | POA: Diagnosis not present

## 2019-04-08 DIAGNOSIS — E1142 Type 2 diabetes mellitus with diabetic polyneuropathy: Secondary | ICD-10-CM | POA: Diagnosis not present

## 2019-04-10 DIAGNOSIS — I10 Essential (primary) hypertension: Secondary | ICD-10-CM | POA: Diagnosis not present

## 2019-04-10 DIAGNOSIS — M2041 Other hammer toe(s) (acquired), right foot: Secondary | ICD-10-CM | POA: Diagnosis not present

## 2019-04-10 DIAGNOSIS — E785 Hyperlipidemia, unspecified: Secondary | ICD-10-CM | POA: Diagnosis not present

## 2019-04-10 DIAGNOSIS — G709 Myoneural disorder, unspecified: Secondary | ICD-10-CM | POA: Diagnosis not present

## 2019-04-10 DIAGNOSIS — Z9181 History of falling: Secondary | ICD-10-CM | POA: Diagnosis not present

## 2019-04-10 DIAGNOSIS — E1142 Type 2 diabetes mellitus with diabetic polyneuropathy: Secondary | ICD-10-CM | POA: Diagnosis not present

## 2019-04-12 ENCOUNTER — Emergency Department
Admission: EM | Admit: 2019-04-12 | Discharge: 2019-04-12 | Disposition: A | Discharge status: Home / Self Care | Payer: Medicare Other | Attending: Emergency Medicine | Admitting: Emergency Medicine

## 2019-04-12 ENCOUNTER — Emergency Department: Payer: Medicare Other

## 2019-04-12 ENCOUNTER — Other Ambulatory Visit: Payer: Self-pay

## 2019-04-12 DIAGNOSIS — Z79899 Other long term (current) drug therapy: Secondary | ICD-10-CM | POA: Diagnosis not present

## 2019-04-12 DIAGNOSIS — R0602 Shortness of breath: Secondary | ICD-10-CM | POA: Diagnosis not present

## 2019-04-12 DIAGNOSIS — R079 Chest pain, unspecified: Secondary | ICD-10-CM | POA: Diagnosis not present

## 2019-04-12 DIAGNOSIS — E1142 Type 2 diabetes mellitus with diabetic polyneuropathy: Secondary | ICD-10-CM | POA: Insufficient documentation

## 2019-04-12 DIAGNOSIS — R0789 Other chest pain: Secondary | ICD-10-CM | POA: Diagnosis not present

## 2019-04-12 DIAGNOSIS — Z794 Long term (current) use of insulin: Secondary | ICD-10-CM | POA: Diagnosis not present

## 2019-04-12 DIAGNOSIS — I10 Essential (primary) hypertension: Secondary | ICD-10-CM | POA: Insufficient documentation

## 2019-04-12 DIAGNOSIS — Z87891 Personal history of nicotine dependence: Secondary | ICD-10-CM | POA: Diagnosis not present

## 2019-04-12 LAB — BASIC METABOLIC PANEL
Anion gap: 11 (ref 5–15)
BUN: 19 mg/dL (ref 6–20)
CO2: 24 mmol/L (ref 22–32)
Calcium: 9.3 mg/dL (ref 8.9–10.3)
Chloride: 98 mmol/L (ref 98–111)
Creatinine, Ser: 0.95 mg/dL (ref 0.44–1.00)
GFR calc Af Amer: >60 mL/min (ref >60)
GFR calc non Af Amer: >60 mL/min (ref >60)
Glucose, Bld: 218 mg/dL — ABNORMAL HIGH (ref 70–99)
Potassium: 4.4 mmol/L (ref 3.5–5.1)
Sodium: 133 mmol/L — ABNORMAL LOW (ref 135–145)

## 2019-04-12 LAB — TROPONIN I (HIGH SENSITIVITY)
Troponin I (High Sensitivity): 4 ng/L (ref <18)
Troponin I (High Sensitivity): 3 ng/L (ref <18)

## 2019-04-12 LAB — URINALYSIS, ROUTINE W REFLEX MICROSCOPIC
Bacteria, UA: NONE SEEN
Bilirubin Urine: NEGATIVE
Glucose, UA: NEGATIVE mg/dL
Hgb urine dipstick: NEGATIVE
Ketones, ur: NEGATIVE mg/dL
Nitrite: NEGATIVE
Protein, ur: NEGATIVE mg/dL
Specific Gravity, Urine: 1.023 (ref 1.005–1.030)
pH: 5 (ref 5.0–8.0)

## 2019-04-12 LAB — CBC
HCT: 34.2 % — ABNORMAL LOW (ref 36.0–46.0)
Hemoglobin: 10.6 g/dL — ABNORMAL LOW (ref 12.0–15.0)
MCH: 24.5 pg — ABNORMAL LOW (ref 26.0–34.0)
MCHC: 31 g/dL (ref 30.0–36.0)
MCV: 79 fL — ABNORMAL LOW (ref 80.0–100.0)
Platelets: 287 10*3/uL (ref 150–400)
RBC: 4.33 MIL/uL (ref 3.87–5.11)
RDW: 16.9 % — ABNORMAL HIGH (ref 11.5–15.5)
WBC: 11.2 10*3/uL — ABNORMAL HIGH (ref 4.0–10.5)
nRBC: 0 % (ref 0.0–0.2)

## 2019-04-12 MED ORDER — ACETAMINOPHEN 500 MG PO TABS
1000.0000 mg | ORAL_TABLET | Freq: Once | ORAL | Status: AC
  Administered 2019-04-12: 1000 mg via ORAL
  Filled 2019-04-12: qty 2

## 2019-04-12 MED ORDER — HYDROCHLOROTHIAZIDE 25 MG PO TABS
25.0000 mg | ORAL_TABLET | Freq: Once | ORAL | Status: AC
  Administered 2019-04-12: 18:00:00 25 mg via ORAL
  Filled 2019-04-12: qty 1

## 2019-04-12 MED ORDER — IOHEXOL 350 MG/ML SOLN
75.0000 mL | Freq: Once | INTRAVENOUS | Status: AC | PRN
  Administered 2019-04-12: 75 mL via INTRAVENOUS
  Filled 2019-04-12: qty 75

## 2019-04-12 MED ORDER — HYDROCHLOROTHIAZIDE 12.5 MG PO TABS: 12.5000 mg | ORAL_TABLET | Freq: Every day | ORAL | 0 refills | Status: DC

## 2019-04-12 NOTE — Discharge Instructions (Addendum)
Work-up was reassuring.  No evidence of heart attack or PE.  Some incidental findings as listed below.  Start you on a low-dose blood pressure medicine.   Follow-up with your primary care doctor next week for blood pressure recheck and to see if this needs to be continued. Discuss with them your BP regime. This can be taken with the lisinopril.   Return for any other concerns.      1. No evidence for pulmonary embolus.  2. Few scattered 2-3 mm solid nodules in the right upper lobe. No  follow-up needed if patient is low-risk (and has no known or  suspected primary neoplasm). Non-contrast chest CT can be considered  in 12 months if patient is high-risk. This recommendation follows  the consensus statement: Guidelines for Management of Incidental  Pulmonary Nodules Detected on CT Images: From the Fleischner Society  2017; Radiology 2017; 284:228-243.  3. Indeterminate 1.7 cm nodule in the body of the left adrenal  gland. In the absence of malignancy history, consider 12 month  follow-up adrenal CT. This recommendation follows ACR consensus  guidelines: Management of Incidental Adrenal Masses: A White Paper  of the ACR Incidental Findings Committee. J Am Coll Radiol  2017;14:1038-1044.  4. Mild bilateral symmetric perinephric stranding, a nonspecific  finding though may correlate with either age or decreased renal  function.  5. Aortic Atherosclerosis (ICD10-I70.0).

## 2019-04-12 NOTE — ED Notes (Signed)
Peripheral IV discontinued. Catheter intact. No signs of infiltration or redness. Gauze applied to IV site.    Discharge instructions reviewed with patient. Questions fielded by this RN. Patient verbalizes understanding of instructions. Patient discharged home in stable condition per funke. No acute distress noted at time of discharge.

## 2019-04-12 NOTE — ED Notes (Signed)
Urine sent to lab 

## 2019-04-12 NOTE — ED Provider Notes (Signed)
[facility] Emergency Department Provider Note  ____________________________________________   First MD Initiated Contact with Patient 04/12/19 1630     (approximate)  I have reviewed the triage vital signs and the nursing notes.   HISTORY  Chief Complaint Chest Pain    HPI [name] is a 53 y.o. female depression, hypertension, hyperlipidemia who comes in with chest pain.  Patient says she had chest pain that started yesterday.  She is unable to really describe the sensation but she does just does not feel right.  She states that it has been constant, nothing makes better, nothing makes it worse.  She has been immobile for about 1 month due to a injury of her foot that she supposed to use a wheelchair for.  She is not on any blood thinners.  She also noted that her blood pressure has been elevated for the past week.  She is on lisinopril and this was recently increased to 20 mg but still not helping.     Past Medical History:  Diagnosis Date  . Depression   . Hyperlipidemia   . Hypertension   . Neuromuscular disorder ([place])   . Schizo affective schizophrenia ([place])    abilify and pazil    Patient Active Problem List   Diagnosis Date Noted  . Neuropathic foot ulcer, left, with fat layer exposed ([place]) 02/05/2019  . Hyperkalemia 02/05/2019  . Osteomyelitis ([place]) 12/11/2018  . Pyelonephritis 12/14/2017  . Morbid obesity ([place]) 05/10/2017  . Long-term use of high-risk medication 05/10/2017  . Hyperlipidemia associated with type 2 diabetes mellitus ([place]) 06/27/2016  . Hammer toe of second toe of right foot 04/20/2016  . Essential hypertension 06/29/2015  . Schizoaffective disorder ([place]) 06/29/2015  . DM (diabetes mellitus), type 2, 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[place]) 16/01/9603  . Polyneuropathy 04/02/2014    Past Surgical History:  Procedure Laterality Date  . CHOLECYSTECTOMY    . IRRIGATION AND DEBRIDEMENT FOOT Right 02/26/2016    Procedure: RIGHT 2ND TOE DEBRIDEMENT;  Surgeon: Samara Deist, DPM;  Location: [facility] ORS;  Service: Podiatry;  Laterality: Right;  . IRRIGATION AND DEBRIDEMENT FOOT Left 12/13/2018   Procedure: IRRIGATION AND DEBRIDEMENT FOOT;  Surgeon: Caroline More, DPM;  Location: [facility] ORS;  Service: Podiatry;  Laterality: Left;    Prior to Admission medications   Medication Sig Start Date End Date Taking? Authorizing Provider  ARIPiprazole (ABILIFY) 10 MG tablet Take 10 mg by mouth daily.    [provider]  atorvastatin (LIPITOR) 40 MG tablet Take 1 tablet (40 mg total) by mouth daily. 02/20/19   Karamalegos, Devonne Doughty, DO  B-D UF III MINI PEN NEEDLES 31G X 5 MM MISC USE AS DIRECTED 08/07/18   Karamalegos, Devonne Doughty, DO  Blood Glucose Monitoring Suppl (ONETOUCH VERIO) w/Device KIT Use to check blood sugar up to 3 x daily 10/29/18   Parks Ranger, Devonne Doughty, DO  buPROPion (WELLBUTRIN XL) 150 MG 24 hr tablet Take 150 mg by mouth daily. Take in AM    [provider]  HUMALOG KWIKPEN 100 UNIT/ML KwikPen Inject 0.05 mLs (5 Units total) into the skin 3 (three) times daily with meals. + Sliding scale Patient taking differently: Inject 10 Units into the skin 3 (three) times daily with meals. + Sliding scale 03/23/18   Karamalegos, Devonne Doughty, DO  Insulin Glargine (LANTUS SOLOSTAR) 100 UNIT/ML Solostar Pen Inject 40 Units into the skin daily. Adjust dose as advised Patient taking differently: Inject 45 Units into the skin daily.  12/17/18  [name], MD  Lancets [facility] ULTRASOFT) lancets Use to check blood sugar up to 3 x daily 03/18/19   Karamalegos, Devonne Doughty, DO  lisinopril (ZESTRIL) 10 MG tablet Take 1 tablet (10 mg total) by mouth daily. 02/05/19   Parks Ranger, Devonne Doughty, DO  metFORMIN (GLUCOPHAGE) 1000 MG tablet Take 1 tablet (1,000 mg total) by mouth 2 (two) times daily with a meal. 02/20/19   Karamalegos, Devonne Doughty, DO  ONETOUCH VERIO test strip Check blood sugar up to 3 x daily  as needed 03/18/19   [name], DO  PARoxetine (PAXIL) 20 MG tablet Take 1 tablet (20 mg total) by mouth daily. Prescribed by [place], DO    Allergies Patient has no known allergies.  Family History  Problem Relation Age of Onset  . Breast cancer Cousin        maternal cousin  . Hypertension Mother   . Hyperthyroidism Mother   . Diabetes Maternal Grandmother   . Hypertension Maternal Grandmother   . Cancer Maternal Grandmother        unknown type  . Diabetes Maternal Grandfather   . Hypertension Maternal Grandfather   . Diabetes Paternal Grandmother   . Hypertension Paternal Grandmother   . Diabetes Paternal Grandfather   . Hypertension Paternal Grandfather     Social History Social History   Tobacco Use  . Smoking status: Former Smoker    Packs/day: 3.00    Years: 30.00    Pack years: 90.00    Types: Cigarettes    Quit date: 05/02/2009    Years since quitting: 9.9  . Smokeless tobacco: Former Systems developer  . Tobacco comment: Pt reported  quitting in 2011  Substance Use Topics  . Alcohol use: No  . Drug use: No      Review of Systems Constitutional: No fever/chills, Eyes: No visual changes. ENT: No sore throat. Cardiovascular: Positive chest pain, positive high blood pressure Respiratory: Denies shortness of breath. Gastrointestinal: No abdominal pain.  No nausea, no vomiting.  No diarrhea.  No constipation. Genitourinary: Negative for dysuria. Musculoskeletal: Negative for back pain. Skin: Negative for rash. Neurological: Negative for headaches, focal weakness or numbness. All other ROS negative ____________________________________________   PHYSICAL EXAM:  VITAL SIGNS: ED Triage Vitals  Enc Vitals Group     BP 04/12/19 1120 (!) 190/111     Pulse Rate 04/12/19 1120 (!) 110     Resp 04/12/19 1120 18     Temp 04/12/19 1120 97.9 F (36.6 C)     Temp Source 04/12/19 1120 Oral     SpO2 04/12/19 1120 99 %      Weight 04/12/19 1121 260 lb (117.9 kg)     Height 04/12/19 1121 _0  (1.778 m)     Head Circumference --      Peak Flow --      Pain Score 04/12/19 1120 3     Pain Loc --      Pain Edu? --      Excl. in [place]? --     Constitutional: Alert and oriented. Well appearing and in no acute distress. Eyes: Conjunctivae are normal. EOMI. Head: Atraumatic. Nose: No congestion/rhinnorhea. Mouth/Throat: Mucous membranes are moist.   Neck: No stridor. Trachea Midline. FROM Cardiovascular: Tachycardic, regular rhythm. Grossly normal heart sounds.  Good peripheral circulation. Respiratory: Normal respiratory effort.  No retractions. Lungs CTAB. Gastrointestinal: Soft and nontender. No distention. No abdominal bruits.  Musculoskeletal: No lower extremity tenderness nor edema.  No joint effusions. Neurologic:  Normal speech and language. No gross focal neurologic deficits are appreciated.  Skin:  Skin is warm, dry and intact. No rash noted. Psychiatric: Mood and affect are normal. Speech and behavior are normal. GU: Deferred   ____________________________________________   LABS (all labs ordered are listed, but only abnormal results are displayed)  Labs Reviewed  BASIC METABOLIC PANEL - Abnormal; Notable for the following components:      Result Value   Sodium 133 (*)    Glucose, Bld 218 (*)    All other components within normal limits  CBC - Abnormal; Notable for the following components:   WBC 11.2 (*)    Hemoglobin 10.6 (*)    HCT 34.2 (*)    MCV 79.0 (*)    MCH 24.5 (*)    RDW 16.9 (*)    All other components within normal limits  URINALYSIS, ROUTINE W REFLEX MICROSCOPIC - Abnormal; Notable for the following components:   Color, Urine STRAW (*)    APPearance CLEAR (*)    Leukocytes,Ua TRACE (*)    All other components within normal limits  URINE CULTURE  TROPONIN I (HIGH SENSITIVITY)  TROPONIN I (HIGH SENSITIVITY)   ____________________________________________   ED ECG  REPORT I, Vanessa Villard, the attending physician, personally viewed and interpreted this ECG.  EKG sinus tachycardia rate of 108, no ST elevation, no T wave inversions, normal intervals, right axis deviation ____________________________________________  RADIOLOGY Robert Bellow, personally viewed and evaluated these images (plain radiographs) as part of my medical decision making, as well as reviewing the written report by the radiologist.  ED MD interpretation: No pneumonia  Official radiology report(s): DG Chest 2 View  Result Date: 04/12/2019 CLINICAL DATA:  Chest pain since yesterday. EXAM: CHEST - 2 VIEW COMPARISON:  Radiographs 11/30/2018 and 10/18/2018. FINDINGS: The heart size and mediastinal contours are stable. There is interval improved aeration of the right lung base. There is stable asymmetric pleural thickening at the right lung apex. No edema, confluent airspace opacity, pleural effusion or pneumothorax. Stable mild degenerative changes within the spine. IMPRESSION: No active cardiopulmonary process. Electronically Signed   By: Richardean Sale M.D.   On: 04/12/2019 12:07   CT Angio Chest PE W and/or Wo Contrast  Result Date: 04/12/2019 CLINICAL DATA:  Shortness of breath, rule out pulmonary embolism EXAM: CT ANGIOGRAPHY CHEST WITH CONTRAST TECHNIQUE: Multidetector CT imaging of the chest was performed using the standard protocol during bolus administration of intravenous contrast. Multiplanar CT image reconstructions and MIPs were obtained to evaluate the vascular anatomy. CONTRAST:  30m OMNIPAQUE IOHEXOL 350 MG/ML SOLN COMPARISON:  Radiograph 04/12/2019 FINDINGS: Cardiovascular: Satisfactory opacification the pulmonary arteries to the segmental level. No pulmonary artery filling defects are identified. Central pulmonary arteries are normal caliber. No elevation of the RV/LV ratio (0.7). Normal cardiac size. Trace pericardial fluid likely within physiologic normal. Normal caliber  thoracic aorta with minimal atherosclerotic plaque. No acute luminal abnormality, periaortic stranding or hemorrhage is seen. Normal 3 vessel branching of the arch. Proximal great vessels opacified normally. Mediastinum/Nodes: No enlarged mediastinal, hilar, or axillary lymph nodes. Thyroid gland, trachea, and esophagus demonstrate no significant findings. Lungs/Pleura: Few scattered 2-3 mm solid nodules are present in the right upper lobe (6/37, 35, 39). Dependent atelectatic changes are noted posteriorly likely accentuated by imaging during exhalation. No consolidation, features of edema, pneumothorax, or effusion. Upper Abdomen: Indeterminate 1.7 cm nodule in the body of the left adrenal gland. Mild bilateral symmetric perinephric stranding,  a nonspecific finding though may correlate with either age or decreased renal function. No acute abnormalities present in the visualized portions of the upper abdomen. Musculoskeletal: No chest wall abnormality. No acute or significant osseous findings. Multilevel degenerative changes are present in the imaged portions of the spine. Review of the MIP images confirms the above findings. IMPRESSION: 1. No evidence for pulmonary embolus. 2. Few scattered 2-3 mm solid nodules in the right upper lobe. No follow-up needed if patient is low-risk (and has no known or suspected primary neoplasm). Non-contrast chest CT can be considered in 12 months if patient is high-risk. This recommendation follows the consensus statement: Guidelines for Management of Incidental Pulmonary Nodules Detected on CT Images: From the Fleischner Society 2017; Radiology 2017; 284:228-243. 3. Indeterminate 1.7 cm nodule in the body of the left adrenal gland. In the absence of malignancy history, consider 12 month follow-up adrenal CT. This recommendation follows ACR consensus guidelines: Management of Incidental Adrenal Masses: A White Paper of the ACR Incidental Findings Committee. J Am Coll Radiol  2017;14:1038-1044. 4. Mild bilateral symmetric perinephric stranding, a nonspecific finding though may correlate with either age or decreased renal function. 5. Aortic Atherosclerosis (ICD10-I70.0). Electronically Signed   By: Lovena Le M.D.   On: 04/12/2019 18:40    ____________________________________________   PROCEDURES  Procedure(s) performed (including Critical Care):  Procedures   ____________________________________________   INITIAL IMPRESSION / ASSESSMENT AND PLAN / ED COURSE   [name] was evaluated in Emergency Department on 04/12/2019 for the symptoms described in the history of present illness. She was evaluated in the context of the global COVID-19 pandemic, which necessitated consideration that the patient might be at risk for infection with the SARS-CoV-2 virus that causes COVID-19. Institutional protocols and algorithms that pertain to the evaluation of patients at risk for COVID-19 are in a state of rapid change based on information released by regulatory bodies including the CDC and federal and state organizations. These policies and algorithms were followed during the patient's care in the ED.    Most Likely DDx:  -MSK (atypical chest pain) but will get cardiac markers to evaluate for ACS given risk factors/age.  Given the tachycardia and increased immobility will get CT PE to rule out pulmonary embolism.  Patient is significantly hypertensive as well but does not sound like a dissection.  Patient is already on lisinopril will start hydrochlorothiazide and have patient follow-up her blood pressure with her primary care doctor.    DDx that was also considered d/t potential to cause harm, but was found less likely based on history and physical (as detailed above): -PNA (no fevers, cough but CXR to evaluate) -PNX (reassured with equal b/l breath sounds, CXR to evaluate) -Symptomatic anemia (will get H&H) -Aortic Dissection as no tearing pain and no  radiation to the mid back, pulses equal -Pericarditis no rub on exam, EKG changes or hx to suggest dx -Tamponade (no notable SOB, tachycardic, hypotensive) -Esophageal rupture (no h/o diffuse vomitting/no crepitus)  Labs are reassuring with initial troponin of 4.  No evidence of significant anemia.  Around her baseline.  White count is slightly elevated at 11.2.  Chest x-ray was negative for acute process.  Repeat trop negative.  CT negative for PE. Some incidental findings and pt will f/u with PCP.  There was concerns about some stranding on her bilateral kidneys.  No symptoms of UTI.  UA urine without evidence of overt UTI  We will give a short course of hydrochlorothiazide to take  with her lisinopril.  Patient structured to follow-up next week for a blood pressure recheck.  Patient also given cardiology's number to follow-up with as needed.  I discussed the provisional nature of ED diagnosis, the treatment so far, the ongoing plan of care, follow up appointments and return precautions with the patient and any family or support people present. They expressed understanding and agreed with the plan, discharged home.  ____________________________________________   FINAL CLINICAL IMPRESSION(S) / ED DIAGNOSES   Final diagnoses:  Hypertension, unspecified type  Chest pain, unspecified type     MEDICATIONS GIVEN DURING THIS VISIT:  Medications  acetaminophen (TYLENOL) tablet 1,000 mg (1,000 mg Oral Given 04/12/19 1740)  hydrochlorothiazide (HYDRODIURIL) tablet 25 mg (25 mg Oral Given 04/12/19 1740)  iohexol (OMNIPAQUE) 350 MG/ML injection 75 mL (75 mLs Intravenous Contrast Given 04/12/19 1819)     ED Discharge Orders         Ordered    hydrochlorothiazide (HYDRODIURIL) 12.5 MG tablet  Daily     04/12/19 1915           Note:  This document was prepared using Dragon voice recognition software and may include unintentional dictation errors.   Vanessa [place], MD 04/12/19  2009

## 2019-04-12 NOTE — ED Triage Notes (Signed)
Pt to ED via POV for c/o chest pain that started yesterday. States she "feels crampy in her chest" and also has pain in her fingers. Denies N/V o rHSOB. Reports h/a that started this morning.  Pt in NAD at this time

## 2019-04-15 ENCOUNTER — Ambulatory Visit: Payer: Medicare Other | Admitting: *Deleted

## 2019-04-15 DIAGNOSIS — E1149 Type 2 diabetes mellitus with other diabetic neurological complication: Secondary | ICD-10-CM

## 2019-04-15 DIAGNOSIS — I10 Essential (primary) hypertension: Secondary | ICD-10-CM

## 2019-04-15 DIAGNOSIS — IMO0002 Reserved for concepts with insufficient information to code with codable children: Secondary | ICD-10-CM

## 2019-04-15 NOTE — Patient Instructions (Signed)
Thank you allowing the Chronic Care Management Team to be a part of your care! It was a pleasure speaking with you today!   CCM (Chronic Care Management) Team   Lilybeth Vien RN, BSN Nurse Care Coordinator  [phone]  Harlow Asa PharmD  Clinical Pharmacist  [phone]  [place] Worker [phone]  Current Barriers:  [name] Knowledge Deficits related to basic Diabetes pathophysiology and self care/management . Knowledge Deficits related to medications used for management of diabetes . Does not use cbg meter   Case Manager Clinical Goal(s):  Over the next 90 days, patient will demonstrate improved adherence to prescribed treatment plan for diabetes self care/management as evidenced by:  . daily monitoring and recording of CBG  . adherence to ADA/ carb modified diet . exercise 3 days/week . adherence to prescribed medication regimen  Interventions:  . Patient recently in the ED related to increased blood pressure, new medication added at that time patient's daughter plans to pick this up today.  . Daughter giving patient all of her daily medications and patient reports her compliance to be a lot better with her daughter helping her.  . Patient reporting blood sugars between 120-150. Patient states she is also trying to do better with her diet.  . Patient reports she has disconnected her phone and the best way to reach her is to call her daughter because that is where she is living at this time.  . Daughter expressed unable to reach Cardiologist office for an appointment, call placed to [facility] Cardiology on hold for >5mins Was able to set patient up appointment with Dr. Ubaldo Glassing 12/16 at [place] daughter to let her know about the appointment.  Verbalized understanding.  . Encouraged patient's daughter to make an appointment with PCP for ED f/u . Patient is continuing with PT who is also monitoring her b/p when in the home.  . Talked with  daughter briefly about care giver stress and plan to mail her caregiver stress education. [address] [place], [place] [address]  Patient Self Care Activities:  . UNABLE to independently self manage diabetes as evidenced by A1C >12  Please see past updates related to this goal by clicking on the "Past Updates" button in the selected goal     The patient verbalized understanding of instructions provided today and declined a print copy of patient instruction materials.   The patient has been provided with contact information for the care management team and has been advised to call with any health related questions or concerns.

## 2019-04-15 NOTE — Chronic Care Management (AMB) (Signed)
Chronic Care Management   Follow Up Note   04/15/2019 Name: [name] MRN: [id number] DOB: 1966-02-19  Referred by: [name], DO Reason for referral : Chronic Care Management (HTN, ED visit ) and Care Coordination (cardiology appointment )   [name] is a 53 y.o. year old female who is a primary care patient of [name], DO. The CCM team was consulted for assistance with chronic disease management and care coordination needs.    Review of patient status, including review of consultants reports, relevant laboratory and other test results, and collaboration with appropriate care team members and the patient's provider was performed as part of comprehensive patient evaluation and provision of chronic care management services.    SDOH (Social Determinants of Health) screening performed today: Stress. See Care Plan for related entries.   Outpatient Encounter Medications as of 04/15/2019  Medication Sig  . ARIPiprazole (ABILIFY) 10 MG tablet Take 10 mg by mouth daily.  [name] atorvastatin (LIPITOR) 40 MG tablet Take 1 tablet (40 mg total) by mouth daily.  . B-D UF III MINI PEN NEEDLES 31G X 5 MM MISC USE AS DIRECTED  . Blood Glucose Monitoring Suppl (ONETOUCH VERIO) w/Device KIT Use to check blood sugar up to 3 x daily  . buPROPion (WELLBUTRIN XL) 150 MG 24 hr tablet Take 150 mg by mouth daily. Take in AM  . HUMALOG KWIKPEN 100 UNIT/ML KwikPen Inject 0.05 mLs (5 Units total) into the skin 3 (three) times daily with meals. + Sliding scale (Patient taking differently: Inject 10 Units into the skin 3 (three) times daily with meals. + Sliding scale)  . hydrochlorothiazide (HYDRODIURIL) 12.5 MG tablet Take 1 tablet (12.5 mg total) by mouth daily for 7 days.  . Insulin Glargine (LANTUS SOLOSTAR) 100 UNIT/ML Solostar Pen Inject 40 Units into the skin daily. Adjust dose as advised (Patient taking differently: Inject 45 Units into the skin daily. )  .  Lancets (ONETOUCH ULTRASOFT) lancets Use to check blood sugar up to 3 x daily  . lisinopril (ZESTRIL) 10 MG tablet Take 1 tablet (10 mg total) by mouth daily.  . metFORMIN (GLUCOPHAGE) 1000 MG tablet Take 1 tablet (1,000 mg total) by mouth 2 (two) times daily with a meal.  . ONETOUCH VERIO test strip Check blood sugar up to 3 x daily as needed  . PARoxetine (PAXIL) 20 MG tablet Take 1 tablet (20 mg total) by mouth daily. Prescribed by [facility] Psychiatry   No facility-administered encounter medications on file as of 04/15/2019.    GOALS ADDRESSED:  Current Barriers:  [name] Knowledge Deficits related to basic Diabetes pathophysiology and self care/management . Knowledge Deficits related to medications used for management of diabetes . Does not use cbg meter   Case Manager Clinical Goal(s):  Over the next 90 days, patient will demonstrate improved adherence to prescribed treatment plan for diabetes self care/management as evidenced by:  . daily monitoring and recording of CBG  . adherence to ADA/ carb modified diet . exercise 3 days/week . adherence to prescribed medication regimen  Interventions:  . Patient recently in the ED related to increased blood pressure, new medication added at that time patient's daughter plans to pick this up today.  . Daughter giving patient all of her daily medications and patient reports her compliance to be a lot better with her daughter helping her.  . Patient reporting blood sugars between 120-150. Patient states she is also trying to do better with her diet.  [name]  Patient reports she has disconnected her phone and the best way to reach her is to call her daughter because that is where she is living at this time.  . Daughter expressed unable to reach Cardiologist office for an appointment, call placed to [facility] Cardiology on hold for >27mns Was able to set patient up appointment with Dr. FUbaldo Glassing12[place]16 [place] to let her know about the  appointment.  Verbalized understanding.  . Encouraged patient's daughter to make an appointment with PCP for ED f/u . Patient is continuing with PT who is also monitoring her b/p when in the home.  . Talked with daughter briefly about care giver stress and plan to mail her caregiver stress education. [address] [place] [address] Patient Self Care Activities:  . UNABLE to independently self manage diabetes as evidenced by A1C >12  Please see past updates related to this goal by clicking on the "Past Updates" button in the selected goal       The care management team will reach out to the patient again over the next 30 days.  The patient has been provided with contact information for the care management team and has been advised to call with any health related questions or concerns.    JMerlene MorseMinor RN, BSN Nurse Case MPharmacist, [facility] Care Management  [phone] Business Mobile

## 2019-04-16 LAB — URINE CULTURE: Culture: >=100000 — AB

## 2019-04-17 ENCOUNTER — Encounter: Payer: Self-pay | Admitting: Family Medicine

## 2019-04-17 ENCOUNTER — Other Ambulatory Visit: Payer: Self-pay

## 2019-04-17 ENCOUNTER — Ambulatory Visit (INDEPENDENT_AMBULATORY_CARE_PROVIDER_SITE_OTHER): Payer: Medicare Other | Admitting: Family Medicine

## 2019-04-17 VITALS — BP 180/109

## 2019-04-17 DIAGNOSIS — R8271 Bacteriuria: Secondary | ICD-10-CM

## 2019-04-17 DIAGNOSIS — I10 Essential (primary) hypertension: Secondary | ICD-10-CM | POA: Diagnosis not present

## 2019-04-17 DIAGNOSIS — E278 Other specified disorders of adrenal gland: Secondary | ICD-10-CM | POA: Diagnosis not present

## 2019-04-17 DIAGNOSIS — IMO0002 Reserved for concepts with insufficient information to code with codable children: Secondary | ICD-10-CM

## 2019-04-17 DIAGNOSIS — E1149 Type 2 diabetes mellitus with other diabetic neurological complication: Secondary | ICD-10-CM

## 2019-04-17 DIAGNOSIS — R918 Other nonspecific abnormal finding of lung field: Secondary | ICD-10-CM

## 2019-04-17 DIAGNOSIS — E1165 Type 2 diabetes mellitus with hyperglycemia: Secondary | ICD-10-CM

## 2019-04-17 MED ORDER — ONETOUCH VERIO VI STRP: ORAL_STRIP | 3 refills | Status: DC

## 2019-04-17 MED ORDER — LISINOPRIL 20 MG PO TABS: 20.0000 mg | ORAL_TABLET | Freq: Every day | ORAL | 3 refills | Status: DC

## 2019-04-17 NOTE — Patient Instructions (Addendum)
Increased Lisinopril from 10 to 20mg . New rx  Increase test strips from 3 to 5 per day.  Follow up with Dr Conni Elliot cardiology as soon as you are able, after re-schedule.  Stay tuned for referral to Pulmonary Nodule Clinic they will check this CT picture again in 1year.  Please try to schedule Diabetic Eye Exam with [facility].  Please schedule a Follow-up Appointment to: Return in about 8 weeks (around 06/12/2019) for keep apt as scheduled.  If you have any other questions or concerns, please feel free to call the office or send a message through [facility]. You may also schedule an earlier appointment if necessary.  Additionally, you may be receiving a survey about your experience at our office within a few days to 1 week by e-mail or mail. We value your feedback.  [name], DO [place]

## 2019-04-17 NOTE — Progress Notes (Signed)
Virtual Visit via Telephone The purpose of this virtual visit is to provide medical care while limiting exposure to the novel coronavirus (COVID19) for both patient and office staff.  Consent was obtained for phone visit:  Yes.   Answered questions that patient had about telehealth interaction:  Yes.   I discussed the limitations, risks, security and privacy concerns of performing an evaluation and management service by telephone. I also discussed with the patient that there may be a patient responsible charge related to this service. The patient expressed understanding and agreed to proceed.  Patient Location: Home Provider Location: [facility])  ---------------------------------------------------------------------- Chief Complaint  Patient presents with  . Chest Pain    f/u from the ER x 6 days ago. The pt was having chest pain and elevated blood pressure. She was seen in the ER and put on HCTZ. The pt admits thst she haven't taking the medication since the hospital, because she was unable to get to the pharmacy to get the prescription.     S: Reviewed CMA documentation. I have called patient and gathered additional HPI as follows:   ED FOLLOW-UP VISIT  Hospital/Location: [place] [facility] Date of ED Visit: 04/12/19  Reason for Presenting to ED: Left arm tingling / Chest Pain / Hypertension  FOLLOW-UP  - ED provider note and record have been reviewed - Patient presents today about 5 days after recent ED visit. Brief summary of recent course, patient had symptoms of chest pain L arm tingling and HTN, presented to ED on 04/12/19, testing in ED with labs, troponin, EKG, Chest X-ray, and CTA, treated with discharge on new BP med HCTZ 12.5 and referred to Dr Ubaldo Glassing [facility] Cardiology.  - Today reports overall has done well after discharge from ED. Symptoms of chest have resolved, Left arm tingling has resolved. Still has elevated BP but was unable to get to pharmacy to pick up new  med HCTZ 12.59m for her BP due to car broke down and also she cannot [facility] due to same lack of transportation. She will call them to re-schedule.  She is taking Lisinopril 16mx 2 = 2036mrequest new rx for higher dose She is using test strips up to 5 times a day with checking CBG as advised that helps her control sugar, she needs new order, she is running out of current test strips.  - New medications on discharge: HCTZ 12.5mg51mChanges to current meds on discharge: none  Denies chest pain dyspnea weakness numbness tingling, nausea vomiting fever chills   I have reviewed the discharge medication list, and have reconciled the current and discharge medications today.   Denies any high risk travel to areas of current concern for COVID19. Denies any known or suspected exposure to person with or possibly with COVID19.  Denies any fevers, chills, sweats, body ache, cough, shortness of breath, sinus pain or pressure, headache, abdominal pain, diarrhea  Past Medical History:  Diagnosis Date  . Depression   . Diabetes mellitus without complication (HCC)[place]. Hyperlipidemia   . Hypertension   . Neuromuscular disorder (HCC)[place]. Schizo affective schizophrenia (HCC)[place] abilify and pazil   Social History   Tobacco Use  . Smoking status: Former Smoker    Packs/day: 3.00    Years: 30.00    Pack years: 90.00    Types: Cigarettes    Quit date: 05/02/2009    Years since quitting: 9.9  . Smokeless  tobacco: Former Systems developer  . Tobacco comment: Pt reported  quitting in 2011  Substance Use Topics  . Alcohol use: No  . Drug use: No    Current Outpatient Medications:  .  ARIPiprazole (ABILIFY) 10 MG tablet, Take 10 mg by mouth daily., Disp: , Rfl:  .  atorvastatin (LIPITOR) 40 MG tablet, Take 1 tablet (40 mg total) by mouth daily., Disp: 90 tablet, Rfl: 3 .  B-D UF III MINI PEN NEEDLES 31G X 5 MM MISC, USE AS DIRECTED, Disp: 100 each, Rfl: 3 .  Blood Glucose Monitoring  Suppl (ONETOUCH VERIO) w/Device KIT, Use to check blood sugar up to 3 x daily, Disp: 1 kit, Rfl: 0 .  buPROPion (WELLBUTRIN XL) 150 MG 24 hr tablet, Take 150 mg by mouth daily. Take in AM, Disp: , Rfl:  .  HUMALOG KWIKPEN 100 UNIT/ML KwikPen, Inject 0.05 mLs (5 Units total) into the skin 3 (three) times daily with meals. + Sliding scale (Patient taking differently: Inject 10 Units into the skin 3 (three) times daily with meals. + Sliding scale), Disp: 15 mL, Rfl: 5 .  hydrochlorothiazide (HYDRODIURIL) 12.5 MG tablet, Take 1 tablet (12.5 mg total) by mouth daily for 7 days., Disp: 7 tablet, Rfl: 0 .  Insulin Glargine (LANTUS SOLOSTAR) 100 UNIT/ML Solostar Pen, Inject 40 Units into the skin daily. Adjust dose as advised (Patient taking differently: Inject 45 Units into the skin daily. ), Disp: 15 mL, Rfl: 1 .  Lancets (ONETOUCH ULTRASOFT) lancets, Use to check blood sugar up to 3 x daily, Disp: 100 each, Rfl: 12 .  lisinopril (ZESTRIL) 20 MG tablet, Take 1 tablet (20 mg total) by mouth daily., Disp: 90 tablet, Rfl: 3 .  metFORMIN (GLUCOPHAGE) 1000 MG tablet, Take 1 tablet (1,000 mg total) by mouth 2 (two) times daily with a meal., Disp: 180 tablet, Rfl: 3 .  ONETOUCH VERIO test strip, Check blood sugar up to 5 x daily as needed, Disp: 450 each, Rfl: 3 .  PARoxetine (PAXIL) 20 MG tablet, Take 1 tablet (20 mg total) by mouth daily. Prescribed by [facility], Disp: 30 tablet, Rfl: 0  Depression screen [facility] 2/9 02/05/2019 10/26/2018 07/27/2018  Decreased Interest 0 0 0  Down, Depressed, Hopeless 0 0 0  PHQ - 2 Score 0 0 0  Altered sleeping 3 1 -  Tired, decreased energy 0 1 -  Change in appetite 0 0 -  Feeling bad or failure about yourself  0 0 -  Trouble concentrating 0 0 -  Moving slowly or fidgety/restless 0 0 -  Suicidal thoughts 0 0 -  PHQ-9 Score 3 2 -  Difficult doing work/chores - Not difficult at all -    GAD 7 : Generalized Anxiety Score 01/03/2018  Nervous, Anxious, on Edge 0   Control/stop worrying 0  Worry too much - different things 0  Trouble relaxing 0  Restless 0  Easily annoyed or irritable 0  Afraid - awful might happen 0  Total GAD 7 Score 0  Anxiety Difficulty Not difficult at all    -------------------------------------------------------------------------- O: No physical exam performed due to remote telephone encounter.  BP (!) 180/109   Lab results reviewed.  CLINICAL DATA:  Shortness of breath, rule out pulmonary embolism  EXAM: CT ANGIOGRAPHY CHEST WITH CONTRAST  TECHNIQUE: Multidetector CT imaging of the chest was performed using the standard protocol during bolus administration of intravenous contrast. Multiplanar CT image reconstructions and MIPs were obtained to evaluate the vascular anatomy.  CONTRAST:  66m OMNIPAQUE IOHEXOL 350 MG/ML SOLN  COMPARISON:  Radiograph 04/12/2019  FINDINGS: Cardiovascular: Satisfactory opacification the pulmonary arteries to the segmental level. No pulmonary artery filling defects are identified. Central pulmonary arteries are normal caliber. No elevation of the RV/LV ratio (0.7). Normal cardiac size. Trace pericardial fluid likely within physiologic normal. Normal caliber thoracic aorta with minimal atherosclerotic plaque. No acute luminal abnormality, periaortic stranding or hemorrhage is seen. Normal 3 vessel branching of the arch. Proximal great vessels opacified normally.  Mediastinum/Nodes: No enlarged mediastinal, hilar, or axillary lymph nodes. Thyroid gland, trachea, and esophagus demonstrate no significant findings.  Lungs/Pleura: Few scattered 2-3 mm solid nodules are present in the right upper lobe (6/37, 35, 39). Dependent atelectatic changes are noted posteriorly likely accentuated by imaging during exhalation. No consolidation, features of edema, pneumothorax, or effusion.  Upper Abdomen: Indeterminate 1.7 cm nodule in the body of the left adrenal gland. Mild  bilateral symmetric perinephric stranding, a nonspecific finding though may correlate with either age or decreased renal function. No acute abnormalities present in the visualized portions of the upper abdomen.  Musculoskeletal: No chest wall abnormality. No acute or significant osseous findings. Multilevel degenerative changes are present in the imaged portions of the spine.  Review of the MIP images confirms the above findings.  IMPRESSION: 1. No evidence for pulmonary embolus. 2. Few scattered 2-3 mm solid nodules in the right upper lobe. No follow-up needed if patient is low-risk (and has no known or suspected primary neoplasm). Non-contrast chest CT can be considered in 12 months if patient is high-risk. This recommendation follows the consensus statement: Guidelines for Management of Incidental Pulmonary Nodules Detected on CT Images: From the Fleischner Society 2017; Radiology 2017; 284:228-243. 3. Indeterminate 1.7 cm nodule in the body of the left adrenal gland. In the absence of malignancy history, consider 12 month follow-up adrenal CT. This recommendation follows ACR consensus guidelines: Management of Incidental Adrenal Masses: A White Paper of the ACR Incidental Findings Committee. J Am Coll Radiol 2017;14:1038-1044. 4. Mild bilateral symmetric perinephric stranding, a nonspecific finding though may correlate with either age or decreased renal function. 5. Aortic Atherosclerosis (ICD10-I70.0).   Electronically Signed   By: PLovena LeM.D.   On: 04/12/2019 18:40  Recent Results (from the past 2160 hour(s))  BASIC METABOLIC PANEL WITH GFR     Status: Abnormal   Collection Time: 02/05/19 11:57 AM  Result Value Ref Range   Glucose, Bld 326 (H) 65 - 99 mg/dL    Comment: .            Fasting reference interval . For someone without known diabetes, a glucose value >125 mg/dL indicates that they may have diabetes and this should be confirmed with  a follow-up test. .    BUN 20 7 - 25 mg/dL   Creat 1.17 (H) 0.50 - 1.05 mg/dL    Comment: For patients >438years of age, the reference limit for Creatinine is approximately 13% higher for people identified as African-American. .    GFR, Est Non African American 53 (L) > OR = 60 mL/min/1.764m  GFR, Est African American 61 > OR = 60 mL/min/1.7360m BUN/Creatinine Ratio 17 6 - 22 (calc)   Sodium 136 135 - 146 mmol/L   Potassium 5.3 3.5 - 5.3 mmol/L   Chloride 102 98 - 110 mmol/L   CO2 23 20 - 32 mmol/L   Calcium 9.5 8.6 - 10.4 mg/dL  Basic metabolic panel     Status: Abnormal  Collection Time: 04/12/19 11:25 AM  Result Value Ref Range   Sodium 133 (L) 135 - 145 mmol/L   Potassium 4.4 3.5 - 5.1 mmol/L   Chloride 98 98 - 111 mmol/L   CO2 24 22 - 32 mmol/L   Glucose, Bld 218 (H) 70 - 99 mg/dL   BUN 19 6 - 20 mg/dL   Creatinine, Ser 0.95 0.44 - 1.00 mg/dL   Calcium 9.3 8.9 - 10.3 mg/dL   GFR calc non Af Amer >60 >60 mL/min   GFR calc Af Amer >60 >60 mL/min   Anion gap 11 5 - 15    Comment: Performed at [facility], [address] [place], [place] [address]  CBC     Status: Abnormal   Collection Time: 04/12/19 11:25 AM  Result Value Ref Range   WBC 11.2 (H) 4.0 - 10.5 K/uL   RBC 4.33 3.87 - 5.11 MIL/uL   Hemoglobin 10.6 (L) 12.0 - 15.0 g/dL   HCT 34.2 (L) 36.0 - 46.0 %   MCV 79.0 (L) 80.0 - 100.0 fL   MCH 24.5 (L) 26.0 - 34.0 pg   MCHC 31.0 30.0 - 36.0 g/dL   RDW 16.9 (H) 11.5 - 15.5 %   Platelets 287 150 - 400 K/uL   nRBC 0.0 0.0 - 0.2 %    Comment: Performed at [facility], [place], [place] [address]  Troponin I (High Sensitivity)     Status: None   Collection Time: 04/12/19 11:25 AM  Result Value Ref Range   Troponin I (High Sensitivity) 4 <18 ng/L    Comment: (NOTE) Elevated high sensitivity troponin I (hsTnI) values and significant  changes across serial measurements may suggest ACS but many other  chronic and acute  conditions are known to elevate hsTnI results.  Refer to the "Links" section for chest pain algorithms and additional  guidance. Performed at [facility], [place], [place] [address]   Troponin I (High Sensitivity)     Status: None   Collection Time: 04/12/19  5:28 PM  Result Value Ref Range   Troponin I (High Sensitivity) 3 <18 ng/L    Comment: (NOTE) Elevated high sensitivity troponin I (hsTnI) values and significant  changes across serial measurements may suggest ACS but many other  chronic and acute conditions are known to elevate hsTnI results.  Refer to the "Links" section for chest pain algorithms and additional  guidance. Performed at [facility], [address] [place], [place] [address]   Urinalysis, Routine w reflex microscopic     Status: Abnormal   Collection Time: 04/12/19  7:20 PM  Result Value Ref Range   Color, Urine STRAW (A) YELLOW   APPearance CLEAR (A) CLEAR   Specific Gravity, Urine 1.023 1.005 - 1.030   pH 5.0 5.0 - 8.0   Glucose, UA NEGATIVE NEGATIVE mg/dL   Hgb urine dipstick NEGATIVE NEGATIVE   Bilirubin Urine NEGATIVE NEGATIVE   Ketones, ur NEGATIVE NEGATIVE mg/dL   Protein, ur NEGATIVE NEGATIVE mg/dL   Nitrite NEGATIVE NEGATIVE   Leukocytes,Ua TRACE (A) NEGATIVE   RBC / HPF 0-5 0 - 5 RBC/hpf   WBC, UA 6-10 0 - 5 WBC/hpf   Bacteria, UA NONE SEEN NONE SEEN   Squamous Epithelial / LPF 0-5 0 - 5   Mucus PRESENT     Comment: Performed at [facility], [address] [place],  [address]  Urine culture     Status:  Abnormal   Collection Time: 04/12/19  8:05 PM   Specimen: Urine, Random  Result Value Ref Range   Specimen Description      URINE, RANDOM Performed at [facility], [address] [place], [place] [address]    Special Requests      NONE Performed at [facility], [place], [place] [address]    Culture (A)     >=100,000 COLONIES/mL  STAPHYLOCOCCUS HAEMOLYTICUS 10,000 COLONIES/mL STAPHYLOCOCCUS AUREUS    Report Status 04/16/2019 FINAL    Organism ID, Bacteria STAPHYLOCOCCUS HAEMOLYTICUS (A)    Organism ID, Bacteria STAPHYLOCOCCUS AUREUS (A)       Susceptibility   Staphylococcus aureus - MIC*    CIPROFLOXACIN >=8 RESISTANT Resistant     GENTAMICIN <=0.5 SENSITIVE Sensitive     NITROFURANTOIN 32 SENSITIVE Sensitive     OXACILLIN 0.5 SENSITIVE Sensitive     TETRACYCLINE <=1 SENSITIVE Sensitive     VANCOMYCIN <=0.5 SENSITIVE Sensitive     TRIMETH/SULFA <=10 SENSITIVE Sensitive     CLINDAMYCIN <=0.25 SENSITIVE Sensitive     RIFAMPIN <=0.5 SENSITIVE Sensitive     Inducible Clindamycin NEGATIVE Sensitive     * 10,000 COLONIES/mL STAPHYLOCOCCUS AUREUS   Staphylococcus haemolyticus - MIC*    CIPROFLOXACIN >=8 RESISTANT Resistant     GENTAMICIN <=0.5 SENSITIVE Sensitive     NITROFURANTOIN 32 SENSITIVE Sensitive     OXACILLIN 0.5 RESISTANT Resistant     TETRACYCLINE <=1 SENSITIVE Sensitive     VANCOMYCIN <=0.5 SENSITIVE Sensitive     TRIMETH/SULFA <=10 SENSITIVE Sensitive     CLINDAMYCIN RESISTANT Resistant     RIFAMPIN <=0.5 SENSITIVE Sensitive     Inducible Clindamycin POSITIVE Resistant     * >=100,000 COLONIES/mL STAPHYLOCOCCUS HAEMOLYTICUS    -------------------------------------------------------------------------- A&P:  Problem List Items Addressed This Visit    Essential hypertension   Relevant Medications   lisinopril (ZESTRIL) 20 MG tablet   DM (diabetes mellitus), type 2, 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HCC)   Relevant Medications   lisinopril (ZESTRIL) 20 MG tablet   ONETOUCH VERIO test strip     #HTN / Chest pain (resolved) Uncontrolled HTN, can be from other secondary causes On lisinopril 46m, taking x 2 of 161mnow - will re order as 2079mnce daily for now She should start new HCTZ 12.5mg42mdered by ED, did not pick up yet due to lack of transportation She has to [facility]/ [facility]  #DM Uncontrolled Followed by Endocrinology Checking CBG up to 5 times daily now to help her insulin regimen Will order new test strips One Touch Verio up to 5 x daily 450 for 90 day  #incidental findings on CTA - Pulm nodules, multiple RUL - former chronic smoker - [facility], repeat CT in 1 year based on recommendations - L adrenal nodule - again recommendation is to repeat CT in 1 year  Orders Placed This Encounter  Procedures  . AMB  Referral to Pulmonary Nodule Clinic    Referral Priority:   Routine    Referral Type:   Consultation    Referral Reason:   Specialty Services Required    Number of Visits Requested:   1     Meds ordered this encounter  Medications  . lisinopril (ZESTRIL) 20 MG tablet    Sig: Take 1 tablet (20 mg total) by mouth daily.    Dispense:  90 tablet    Refill:  3  . ONETOUCH VERIO test strip    Sig: Check blood sugar up to 5 x daily as needed    Dispense:  450 each    Refill:  3    One Touch Verio, E11.49, E11.65. for 90 day supply    Follow-up: - Return in 06/2019 as scheduled  Patient verbalizes understanding with the above medical recommendations including the limitation of remote medical advice.  Specific follow-up and call-back criteria were given for patient to follow-up or seek medical care more urgently if needed.   - Time spent in direct consultation with patient on phone: 11 minutes   [name], [facility] 04/17/2019, 8:12 AM

## 2019-04-18 NOTE — Progress Notes (Signed)
Brief Pharmacy Note  Patient is a 53 y/o F with medical history of diabetes, hypertension, hyperlipidemia who presented to [facility] ED 12/11 with chief complaint of chest pain. She was ultimately discharged home with PCP follow-up. UA with no bacteria seen, 0-5 RBC, 0-5 squamous cells, and 6-10 WBC. Per chart no symptoms of UTI. Urine culture from ED visit has resulted >100k colonies/mL Staphylococcus haemolyticus and 10k colonies/mL Staphylococcus aureus. Patient was not discharged on an antibiotic. Mild leukocytosis but consistent with patient's baseline. Given no genitourinary symptoms, no systemic signs of infection, and presence of multiple species, this result may reflect contamination or asymptomatic bacteriuria. Spoke with ED provider about result with plan to call patient and assess. Attempted to reach patient x2 with no response and no voicemail set up.  Superior Resident 18 April 2019

## 2019-04-20 DIAGNOSIS — E11621 Type 2 diabetes mellitus with foot ulcer: Secondary | ICD-10-CM | POA: Diagnosis not present

## 2019-04-26 ENCOUNTER — Encounter (HOSPITAL_COMMUNITY): Payer: Self-pay | Admitting: Emergency Medicine

## 2019-04-26 ENCOUNTER — Emergency Department (HOSPITAL_COMMUNITY)
Admission: EM | Admit: 2019-04-26 | Discharge: 2019-04-27 | Disposition: A | Discharge status: Home / Self Care | Payer: Medicare Other | Attending: Emergency Medicine | Admitting: Emergency Medicine

## 2019-04-26 ENCOUNTER — Other Ambulatory Visit: Payer: Self-pay

## 2019-04-26 DIAGNOSIS — Z87891 Personal history of nicotine dependence: Secondary | ICD-10-CM | POA: Insufficient documentation

## 2019-04-26 DIAGNOSIS — M79651 Pain in right thigh: Secondary | ICD-10-CM | POA: Diagnosis not present

## 2019-04-26 DIAGNOSIS — M79652 Pain in left thigh: Secondary | ICD-10-CM | POA: Insufficient documentation

## 2019-04-26 DIAGNOSIS — E119 Type 2 diabetes mellitus without complications: Secondary | ICD-10-CM | POA: Insufficient documentation

## 2019-04-26 DIAGNOSIS — R609 Edema, unspecified: Secondary | ICD-10-CM | POA: Diagnosis not present

## 2019-04-26 DIAGNOSIS — I10 Essential (primary) hypertension: Secondary | ICD-10-CM | POA: Insufficient documentation

## 2019-04-26 DIAGNOSIS — R000 Tachycardia, unspecified: Secondary | ICD-10-CM | POA: Diagnosis not present

## 2019-04-26 DIAGNOSIS — R200 Anesthesia of skin: Secondary | ICD-10-CM | POA: Diagnosis not present

## 2019-04-26 DIAGNOSIS — Z79899 Other long term (current) drug therapy: Secondary | ICD-10-CM | POA: Diagnosis not present

## 2019-04-26 DIAGNOSIS — E1165 Type 2 diabetes mellitus with hyperglycemia: Secondary | ICD-10-CM | POA: Diagnosis not present

## 2019-04-26 DIAGNOSIS — Z743 Need for continuous supervision: Secondary | ICD-10-CM | POA: Diagnosis not present

## 2019-04-26 DIAGNOSIS — M79672 Pain in left foot: Secondary | ICD-10-CM | POA: Insufficient documentation

## 2019-04-26 DIAGNOSIS — R52 Pain, unspecified: Secondary | ICD-10-CM | POA: Diagnosis not present

## 2019-04-26 DIAGNOSIS — R600 Localized edema: Secondary | ICD-10-CM | POA: Insufficient documentation

## 2019-04-26 DIAGNOSIS — Z794 Long term (current) use of insulin: Secondary | ICD-10-CM | POA: Diagnosis not present

## 2019-04-26 DIAGNOSIS — M7989 Other specified soft tissue disorders: Secondary | ICD-10-CM | POA: Diagnosis not present

## 2019-04-26 DIAGNOSIS — M79605 Pain in left leg: Secondary | ICD-10-CM | POA: Diagnosis not present

## 2019-04-26 NOTE — ED Triage Notes (Signed)
Patient brought in via EMS, reports pain and numbness in both legs from her hips to her knees. Patient states she has never had this type of pain before. Onset last night. Patient also c/o headache.

## 2019-04-27 ENCOUNTER — Emergency Department (HOSPITAL_COMMUNITY): Payer: Medicare Other

## 2019-04-27 DIAGNOSIS — M79652 Pain in left thigh: Secondary | ICD-10-CM | POA: Diagnosis not present

## 2019-04-27 DIAGNOSIS — M79672 Pain in left foot: Secondary | ICD-10-CM | POA: Diagnosis not present

## 2019-04-27 DIAGNOSIS — M7989 Other specified soft tissue disorders: Secondary | ICD-10-CM | POA: Diagnosis not present

## 2019-04-27 LAB — CBC WITH DIFFERENTIAL/PLATELET
Abs Immature Granulocytes: 0.07 10*3/uL (ref 0.00–0.07)
Basophils Absolute: 0.1 10*3/uL (ref 0.0–0.1)
Basophils Relative: 1 %
Eosinophils Absolute: 0.2 10*3/uL (ref 0.0–0.5)
Eosinophils Relative: 2 %
HCT: 31.8 % — ABNORMAL LOW (ref 36.0–46.0)
Hemoglobin: 9.9 g/dL — ABNORMAL LOW (ref 12.0–15.0)
Immature Granulocytes: 1 %
Lymphocytes Relative: 16 %
Lymphs Abs: 2 10*3/uL (ref 0.7–4.0)
MCH: 25.3 pg — ABNORMAL LOW (ref 26.0–34.0)
MCHC: 31.1 g/dL (ref 30.0–36.0)
MCV: 81.1 fL (ref 80.0–100.0)
Monocytes Absolute: 0.6 10*3/uL (ref 0.1–1.0)
Monocytes Relative: 5 %
Neutro Abs: 9.2 10*3/uL — ABNORMAL HIGH (ref 1.7–7.7)
Neutrophils Relative %: 75 %
Platelets: 353 10*3/uL (ref 150–400)
RBC: 3.92 MIL/uL (ref 3.87–5.11)
RDW: 16.7 % — ABNORMAL HIGH (ref 11.5–15.5)
WBC: 12.1 10*3/uL — ABNORMAL HIGH (ref 4.0–10.5)
nRBC: 0 % (ref 0.0–0.2)

## 2019-04-27 LAB — BASIC METABOLIC PANEL
Anion gap: 9 (ref 5–15)
BUN: 26 mg/dL — ABNORMAL HIGH (ref 6–20)
CO2: 23 mmol/L (ref 22–32)
Calcium: 9.1 mg/dL (ref 8.9–10.3)
Chloride: 97 mmol/L — ABNORMAL LOW (ref 98–111)
Creatinine, Ser: 1.16 mg/dL — ABNORMAL HIGH (ref 0.44–1.00)
GFR calc Af Amer: >60 mL/min (ref >60)
GFR calc non Af Amer: 53 mL/min — ABNORMAL LOW (ref >60)
Glucose, Bld: 203 mg/dL — ABNORMAL HIGH (ref 70–99)
Potassium: 5 mmol/L (ref 3.5–5.1)
Sodium: 129 mmol/L — ABNORMAL LOW (ref 135–145)

## 2019-04-27 NOTE — Discharge Instructions (Addendum)
As discussed, your evaluation today has been largely reassuring.  But, it is important that you monitor your condition carefully, and do not hesitate to return to the ED if you develop new, or concerning changes in your condition.  Otherwise, please follow-up with your physician for appropriate ongoing care.  Below is some additional information about Charcot arthropathy: WHAT IS CHARCOT ARTHROPATHY? Charcot arthropathy, also known as Charcot neuroarthropathy or Charcot foot and ankle, is a syndrome in patients who have peripheral neuropathy, or loss of sensation, in the foot and ankle. Patients may experience fractures and dislocations of bones and joints with minimal or no known trauma.  Symptoms Initially, there may be swelling, redness and increased warmth of the foot and ankle. Later, when fractures and dislocations occur, there may be severe deformities of the foot and ankle, including collapse of the midfoot arch (often called rocker bottom foot) or instability of the ankle and hindfoot. The syndrome progresses through three general stages:  Stage 1 (acute, development-fragmentation): marked redness, swelling, warmth; early X-rays show soft tissue swelling, and bony fragmentation and joint dislocation may be noted several weeks after onset Stage 2 (subacute, coalescence): decreased redness, swelling, and warmth; X-rays show early bone healing Stage 3 (chronic, reconstruction-consolidation): redness, swelling, warmth resolved; bone healing or nonunion and residual deformity frequently are present.  Causes Charcot foot occurs in patients with peripheral neuropathy resulting from diverse conditions including diabetes mellitus, leprosy, syphilis, poliomyelitis, chronic alcoholism, or syringomyelia. Repetitive microtrauma that exceeds the rate of healing may cause fractures and dislocations. Changes in circulation may cause resorption of bone, weakening the bone and increasing susceptibility to  fracture and dislocation.   Charcot arthropathy may affect any part of the foot and ankle. Multiple regions may be involved. Fractures and dislocations frequently involve several bones and joints, with extensive fragmentation and deformity.   Diagnosis The time between the start of symptoms and a diagnosis may be several weeks or months. Often Charcot arthropathy is misdiagnosed initially because symptoms can mimic those of an injury or infection. Diagnosis is based on a high likelihood for this problem in patients with neuropathy. Increased redness, swelling, and warmth may be the only early signs. Immobilization and elevation can help to differentiate between infection and early Charcot. Some patients have pain. Early images may show soft tissue swelling with no bony changes, but repeat X-rays several weeks or months later may show bone and joint changes.  Treatments Non-surgical: Non-surgical treatment includes a protective splint, walking brace, orthosis or cast. Early weightbearing is allowed in stage one by 41 percent of specialists and in stage two by 49 percent of specialists, and other specialists recommend non-weightbearing. After stable healing is noted in stage three, treatment includes accommodative footwear with protective orthoses.

## 2019-04-27 NOTE — ED Provider Notes (Signed)
[facility] EMERGENCY DEPARTMENT Provider Note   CSN: [id number] Arrival date & time: 04/26/19  2059     History Chief Complaint  Patient presents with  . Leg Pain    [name] is a 53 y.o. female.  HPI    Patient with multiple medical issues including polyneuropathy presents with concern of bilateral leg discomfort. Patient's medical problems include diabetes, hypertension, and polyneuropathy, as above. She has essentially no sensation in either foot, and left foot poorly healing ulcer, as well as Charcot arthropathy. She follows with a foot specialist, as well as with primary care at [facility] clinic. She notes that she typically does have some discomfort proximally, but over the past few hours has had pain in both thighs, radiating inferiorly from the mid point. No appreciable change distal to this, though she does voice concern of possible blood clot. No fever, no chills, no chest pain, no abdominal pain, no change in sensation distally. No new medication, diet, activity.  She notes that although she does see a foot specialist and primary care she has difficulty with taking these appointments due to social economic factors, and ongoing pandemic.  Past Medical History:  Diagnosis Date  . Depression   . Diabetes mellitus without complication ([place])   . Hyperlipidemia   . Hypertension   . Neuromuscular disorder ([place])   . Schizo affective schizophrenia ([place])    abilify and pazil    Patient Active Problem List   Diagnosis Date Noted  . Neuropathic foot ulcer, left, with fat layer exposed ([place]) 02/05/2019  . Hyperkalemia 02/05/2019  . Osteomyelitis ([place]) 12/11/2018  . Pyelonephritis 12/14/2017  . Morbid obesity ([place]) 05/10/2017  . Long-term use of high-risk medication 05/10/2017  . Hyperlipidemia associated with type 2 diabetes mellitus ([place]) 06/27/2016  . Hammer toe of second toe of right foot 04/20/2016  . Essential hypertension 06/29/2015  . Schizoaffective  disorder ([place]) 06/29/2015  . DM (diabetes mellitus), type 2, 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[place]) 94/17/4081  . Polyneuropathy 04/02/2014    Past Surgical History:  Procedure Laterality Date  . CHOLECYSTECTOMY    . IRRIGATION AND DEBRIDEMENT FOOT Right 02/26/2016   Procedure: RIGHT 2ND TOE DEBRIDEMENT;  Surgeon: Samara Deist, DPM;  Location: [facility] ORS;  Service: Podiatry;  Laterality: Right;  . IRRIGATION AND DEBRIDEMENT FOOT Left 12/13/2018   Procedure: IRRIGATION AND DEBRIDEMENT FOOT;  Surgeon: Caroline More, DPM;  Location: [facility] ORS;  Service: Podiatry;  Laterality: Left;     OB History    Gravida  2   Para  2   Term  2   Preterm      AB      Living        SAB      TAB      Ectopic      Multiple      Live Births              Family History  Problem Relation Age of Onset  . Breast cancer Cousin        maternal cousin  . Hypertension Mother   . Hyperthyroidism Mother   . Diabetes Maternal Grandmother   . Hypertension Maternal Grandmother   . Cancer Maternal Grandmother        unknown type  . Diabetes Maternal Grandfather   . Hypertension Maternal Grandfather   . Diabetes Paternal Grandmother   . Hypertension Paternal Grandmother   . Diabetes Paternal Grandfather   . Hypertension Paternal Grandfather     Social  History   Tobacco Use  . Smoking status: Former Smoker    Packs/day: 3.00    Years: 30.00    Pack years: 90.00    Types: Cigarettes    Quit date: 05/02/2009    Years since quitting: 9.9  . Smokeless tobacco: Former Systems developer  . Tobacco comment: Pt reported  quitting in 2011  Substance Use Topics  . Alcohol use: No  . Drug use: No    Home Medications Prior to Admission medications   Medication Sig Start Date End Date Taking? Authorizing Provider  ARIPiprazole (ABILIFY) 10 MG tablet Take 10 mg by mouth daily.    [provider]  atorvastatin (LIPITOR) 40 MG tablet Take 1 tablet (40 mg total) by mouth daily. 02/20/19    Karamalegos, Devonne Doughty, DO  B-D UF III MINI PEN NEEDLES 31G X 5 MM MISC USE AS DIRECTED 08/07/18   Karamalegos, Devonne Doughty, DO  Blood Glucose Monitoring Suppl (ONETOUCH VERIO) w/Device KIT Use to check blood sugar up to 3 x daily 10/29/18   Parks Ranger, Devonne Doughty, DO  buPROPion (WELLBUTRIN XL) 150 MG 24 hr tablet Take 150 mg by mouth daily. Take in AM    [provider]  HUMALOG KWIKPEN 100 UNIT/ML KwikPen Inject 0.05 mLs (5 Units total) into the skin 3 (three) times daily with meals. + Sliding scale Patient taking differently: Inject 10 Units into the skin 3 (three) times daily with meals. + Sliding scale 03/23/18   Karamalegos, Devonne Doughty, DO  hydrochlorothiazide (HYDRODIURIL) 12.5 MG tablet Take 1 tablet (12.5 mg total) by mouth daily for 7 days. 04/12/19 04/19/19  Vanessa Jolley, MD  Insulin Glargine (LANTUS SOLOSTAR) 100 UNIT/ML Solostar Pen Inject 40 Units into the skin daily. Adjust dose as advised Patient taking differently: Inject 45 Units into the skin daily.  12/17/18   [name], MD  Lancets [facility] ULTRASOFT) lancets Use to check blood sugar up to 3 x daily 03/18/19   Karamalegos, Devonne Doughty, DO  lisinopril (ZESTRIL) 20 MG tablet Take 1 tablet (20 mg total) by mouth daily. 04/17/19   Parks Ranger, Devonne Doughty, DO  metFORMIN (GLUCOPHAGE) 1000 MG tablet Take 1 tablet (1,000 mg total) by mouth 2 (two) times daily with a meal. 02/20/19   Karamalegos, Devonne Doughty, DO  ONETOUCH VERIO test strip Check blood sugar up to 5 x daily as needed 04/17/19   [name], DO  PARoxetine (PAXIL) 20 MG tablet Take 1 tablet (20 mg total) by mouth daily. Prescribed by [place], DO    Allergies    Patient has no known allergies.  Review of Systems   Review of Systems  Constitutional:       Per HPI, otherwise negative  HENT:       Per HPI, otherwise negative  Respiratory:       Per HPI, otherwise negative  Cardiovascular:       Per HPI,  otherwise negative  Gastrointestinal: Negative for vomiting.  Endocrine:       Negative aside from HPI  Genitourinary:       Neg aside from HPI   Musculoskeletal:       Per HPI, otherwise negative  Skin: Negative.   Neurological: Positive for numbness. Negative for syncope.    Physical Exam Updated Vital Signs BP (!) 120/59   Pulse (!) 128   Temp 99.1 F (37.3 C) (Oral)   Resp 20   Ht 5' 10" (1.778 m)  Wt 120.2 kg   SpO2 97%   BMI 38.02 kg/m   Physical Exam Vitals and nursing note reviewed.  Constitutional:      General: She is not in acute distress.    Appearance: She is well-developed.  HENT:     Head: Normocephalic and atraumatic.  Eyes:     Conjunctiva/sclera: Conjunctivae normal.  Cardiovascular:     Rate and Rhythm: Normal rate and regular rhythm.  Pulmonary:     Effort: Pulmonary effort is normal. No respiratory distress.     Breath sounds: Normal breath sounds. No stridor.  Abdominal:     General: There is no distension.  Musculoskeletal:       Legs:  Skin:    General: Skin is warm and dry.  Neurological:     Mental Status: She is alert and oriented to person, place, and time.     Cranial Nerves: No cranial nerve deficit.     Comments: No sensation distally either foot     ED Results / Procedures / Treatments   Labs (all labs ordered are listed, but only abnormal results are displayed) Labs Reviewed  CBC WITH DIFFERENTIAL/PLATELET - Abnormal; Notable for the following components:      Result Value   WBC 12.1 (*)    Hemoglobin 9.9 (*)    HCT 31.8 (*)    MCH 25.3 (*)    RDW 16.7 (*)    Neutro Abs 9.2 (*)    All other components within normal limits  BASIC METABOLIC PANEL - Abnormal; Notable for the following components:   Sodium 129 (*)    Chloride 97 (*)    Glucose, Bld 203 (*)    BUN 26 (*)    Creatinine, Ser 1.16 (*)    GFR calc non Af Amer 53 (*)    All other components within normal limits    EKG EKG Interpretation  Date/Time:   Friday April 26 2019 21:27:39 EST Ventricular Rate:  134 PR Interval:  164 QRS Duration: 80 QT Interval:  272 QTC Calculation: 406 R Axis:   41 Text Interpretation: Sinus tachycardia Septal infarct , age undetermined Abnormal ECG No significant change since last tracing Abnormal ECG Confirmed by Carmin Muskrat [phone]) on 04/27/2019 12:29:48 AM   Radiology DG Foot Complete Left  Result Date: 04/27/2019 CLINICAL DATA:  Left foot pain swelling EXAM: LEFT FOOT - COMPLETE 3+ VIEW COMPARISON:  Radiograph December 13, 2018 FINDINGS: There is fragmentation seen throughout the cuboid and cuneiform is. There is fracture and medial displacement of the navicular. There is inferior subluxation of the hindfoot with fragmentation of the anterior talus and calcaneus. There is diffuse soft tissue swelling. There is diffuse osteopenia seen throughout the remainder of the osseous structures. IMPRESSION: Findings suggestive of rapidly progressing Charcot arthropathy versus extensive osteomyelitis. Electronically Signed   By: Prudencio Pair M.D.   On: 04/27/2019 01:01    Procedures Procedures (including critical care time)  Medications Ordered in ED Medications - No data to display  ED Course  I have reviewed the triage vital signs and the nursing notes.  Pertinent labs & imaging results that were available during my care of the patient were reviewed by me and considered in my medical decision making (see chart for details).    MDM Rules/Calculators/A&P                      1:31 AM On repeat exam the patient is awake, alert, in no distress. Labs  reviewed, similar to multiple prior studies, without substantial changes. She has baseline mild leukocytosis, baseline anemia, essentially unchanged. Patient is not hemodynamically stable, has no new erythema, no fever, low suspicion for cellulitis. On chart review is clear that she has a history of Charcot arthropathy, consistent with today's physical exam  findings.  Absent any lower extremity new swelling, and with negative Wells score, the patient does not require immediate ultrasound. Some suspicion for persistency of her polyneuropathy, complicating her recovery from Charcot arthropathy, no evidence for acute new pathology currently. Patient aware of importance of following up with her specialists, as well as primary care promptly. Final Clinical Impression(s) / ED Diagnoses Final diagnoses:  Left leg pain     Carmin Muskrat, MD 04/27/19 [phone]

## 2019-04-29 ENCOUNTER — Ambulatory Visit (INDEPENDENT_AMBULATORY_CARE_PROVIDER_SITE_OTHER): Payer: Medicare Other | Admitting: Pharmacist

## 2019-04-29 ENCOUNTER — Other Ambulatory Visit: Payer: Self-pay | Admitting: Family Medicine

## 2019-04-29 DIAGNOSIS — E1165 Type 2 diabetes mellitus with hyperglycemia: Secondary | ICD-10-CM

## 2019-04-29 DIAGNOSIS — E785 Hyperlipidemia, unspecified: Secondary | ICD-10-CM | POA: Diagnosis not present

## 2019-04-29 DIAGNOSIS — E1169 Type 2 diabetes mellitus with other specified complication: Secondary | ICD-10-CM

## 2019-04-29 DIAGNOSIS — IMO0002 Reserved for concepts with insufficient information to code with codable children: Secondary | ICD-10-CM

## 2019-04-29 DIAGNOSIS — E1149 Type 2 diabetes mellitus with other diabetic neurological complication: Secondary | ICD-10-CM | POA: Diagnosis not present

## 2019-04-29 DIAGNOSIS — I10 Essential (primary) hypertension: Secondary | ICD-10-CM

## 2019-04-29 MED ORDER — HYDROCHLOROTHIAZIDE 12.5 MG PO TABS: 12.5000 mg | ORAL_TABLET | Freq: Every day | ORAL | 2 refills | Status: DC

## 2019-04-29 NOTE — Chronic Care Management (AMB) (Signed)
Chronic Care Management   Follow Up Note   04/29/2019 Name: [name] MRN: [id number] DOB: 1965-09-04  Referred by: [name], DO Reason for referral : Chronic Care Management (Patient Phone Call)   [name] is a 53 y.o. year old female who is a primary care patient of [name], DO. The CCM team was consulted for assistance with chronic disease management and care coordination needs.  [name] has a past medical history including but not limited to type 2 diabetes, hypertension and hyperlipidemia.  Receive message from [facility] Nurse Case Manager that patient reports she has disconnected her phone and requested contact to her daughter, [name], as she that is living with her at this time.  I reached out to patient/daughter by phone today.   Review of patient status, including review of consultants reports, relevant laboratory and other test results, and collaboration with appropriate care team members and the patient's provider was performed as part of comprehensive patient evaluation and provision of chronic care management services.    Objective  Lab Results  Component Value Date   K 5.0 04/26/2019   BP Readings from Last 3 Encounters:  04/27/19 120/62  04/17/19 (!) 180/109  04/12/19 (!) 148/94   Lab Results  Component Value Date   CREATININE 1.16 (H) 04/26/2019    Outpatient Encounter Medications as of 04/29/2019  Medication Sig  . atorvastatin (LIPITOR) 40 MG tablet Take 1 tablet (40 mg total) by mouth daily.  [name] HUMALOG KWIKPEN 100 UNIT/ML KwikPen Inject 0.05 mLs (5 Units total) into the skin 3 (three) times daily with meals. + Sliding scale (Patient taking differently: Inject 12 Units into the skin 3 (three) times daily with meals. + Sliding scale)  . Insulin Glargine (LANTUS SOLOSTAR) 100 UNIT/ML Solostar Pen Inject 40 Units into the skin daily. Adjust dose as advised (Patient taking differently: Inject 55 Units into  the skin daily. )  . lisinopril (ZESTRIL) 20 MG tablet Take 1 tablet (20 mg total) by mouth daily.  . metFORMIN (GLUCOPHAGE) 1000 MG tablet Take 1 tablet (1,000 mg total) by mouth 2 (two) times daily with a meal.  . ARIPiprazole (ABILIFY) 10 MG tablet Take 10 mg by mouth daily.  . B-D UF III MINI PEN NEEDLES 31G X 5 MM MISC USE AS DIRECTED  . Blood Glucose Monitoring Suppl (ONETOUCH VERIO) w/Device KIT Use to check blood sugar up to 3 x daily  . buPROPion (WELLBUTRIN XL) 150 MG 24 hr tablet Take 150 mg by mouth daily. Take in AM  . Lancets (ONETOUCH ULTRASOFT) lancets Use to check blood sugar up to 3 x daily  . ONETOUCH VERIO test strip Check blood sugar up to 5 x daily as needed  . PARoxetine (PAXIL) 20 MG tablet Take 1 tablet (20 mg total) by mouth daily. Prescribed by [facility]  . [DISCONTINUED] hydrochlorothiazide (HYDRODIURIL) 12.5 MG tablet Take 1 tablet (12.5 mg total) by mouth daily for 7 days.   No facility-administered encounter medications on file as of 04/29/2019.    Goals Addressed            This Visit's Progress   . PharmD - Medication Management       Current Barriers:  . Financial Barriers . Non adherence to prescribed medication regimen  Pharmacist Clinical Goal(s):  [name] Over the next 30 days, patient will work with [name] to address needs related to optimization of medication regimen and improvement of medication adherence  Interventions: . Perform  chart review o Patient seen in [facility] ED on 12/11 for hypertension/chest pain - Patient instructed to:  [name] Continue lisinopril 20 mg once daily . START hydrochlorothiazide 12.5 mg once daily o Patient seen by PCP on 12/16 - Denied having started hydrochlorothiazide due to transportation issue - Advised to:  [name] Continue lisinopril 20 mg once daily . Start hydrochlorothiazide as ordered by ED provider  . Reschedule canceled appointment with Cardiologist o Patient seen in [facility] ED on 12/25 for leg  pain/numbness . Daughter/caregiver confirms patient currently staying with daughter. Reports improvement in patient's medication adherence as she manages patient's medications and insulin . Counsel on importance of patient's blood pressure monitoring and control o Reports patient currently taking lisinopril 20 mg once daily o Currently NOT taking HCTZ 12.5 mg daily as used up supply ordered. Note ED provider ordered only a 7-day supply on 12/11. o Denies keeping log of blood pressures, but reports systolic readings typically ranging 150-180s, but has been as high as 200 o Review importance of seeking Urgent Medical Care if chest pain, shortness of breath, back pain, numbness/weakness, change in vision, or difficulty speaking with reading ?180/120 - Daughter verbalizes understanding and notes patient has gone to ED when had symptoms of headaches, numbness, shortness of breath and pain with these elevated BP readings o Daughter states that she believes patient rescheduled Cardiology appointment with Dr. Ubaldo Glassing for 1/6. Note upcoming appointment in patient's chart with Podiatry for 1/6, but not Cardiology. Encourage patient to call to verify/schedule appointment. - Review chart -Cardiology appointment now scheduled for 12/29. [name] Coordination of care with PCP. Request new prescription for HCTZ 12.5 mg once daily to be sent to patient's [place] in [place] for patient to continue as directed.  o PCP sends prescription to pharmacy.  o Follow up call to daughter to advise prescription sent . Follow up regarding importance of blood sugar control and monitoring o Reports patient taking - Metformin 1000 mg twice daily - Lantus 55 units once daily - Humalog 12 units before each meal PLUS sliding scale as given by Endocrinologist on 11/19 . Will mail patient blood pressure log, blood sugar log & hypertension monitoring education as requested  Patient Self Care Activities:  . Self administers  medications as prescribed o Using weekly pillbox and calendar as adherence tools . Attends all scheduled provider appointments o Next appointment with Endocrinology on 12/30 o Next appointment with Cardiology on 12/29 o Next appointment with Podiatry on 1/6 . Calls pharmacy for medication refills . Calls provider office for new concerns or questions . Patient to check blood sugar as directed by Endocrinologist and keep log  Please see past updates related to this goal by clicking on the "Past Updates" button in the selected goal         Plan  Telephone follow up appointment with care management team member scheduled for: 1/8 at 2 pm  Harlow Asa, PharmD, [place] [phone]

## 2019-04-29 NOTE — Patient Instructions (Signed)
Thank you allowing the Chronic Care Management Team to be a part of your care! It was a pleasure speaking with you today!     CCM (Chronic Care Management) Team    Janci Minor RN, BSN Nurse Care Coordinator  [phone]   Harlow Asa PharmD  Clinical Pharmacist  [phone]   [name] LCSW Clinical Social Worker [phone]  Visit Information  Goals Addressed            This Visit's Progress   . PharmD - Medication Management       Current Barriers:  . Financial Barriers . Non adherence to prescribed medication regimen  Pharmacist Clinical Goal(s):  [name] Over the next 30 days, patient will work with [name] to address needs related to optimization of medication regimen and improvement of medication adherence  Interventions: . Perform chart review . Daughter/caregiver confirms patient currently staying with daughter. Reports improvement in patient's medication adherence as she manages patient's medications and insulin . Counsel on importance of patient's blood pressure monitoring and control o Reports patient currently taking lisinopril 20 mg once daily o Currently NOT taking HCTZ 12.5 mg daily as used up supply ordered. Note ED provider ordered only a 7-day supply on 12/11. o Denies keeping log of blood pressures, but reports systolic readings typically ranging 150-180s, but has been as high as 200 o Review importance of seeking Urgent Medical Care if chest pain, shortness of breath, back pain, numbness/weakness, change in vision, or difficulty speaking with reading ?180/120 - Daughter verbalizes understanding and notes patient has gone to ED when had symptoms of headaches, numbness, shortness of breath and pain with these elevated BP readings o Daughter states that she believes patient rescheduled Cardiology appointment with Dr. Ubaldo Glassing for 1/6. Note upcoming appointment in patient's chart with Podiatry for 1/6, but not Cardiology. Encourage patient to call  to verify/schedule appointment. - Review chart -Cardiology appointment now scheduled for 12/29. [name] Coordination of care with PCP. Request new prescription for HCTZ 12.5 mg once daily to be sent to patient's [place] in [place] for patient to continue as directed.  o PCP sends prescription to pharmacy.  o Follow up call to daughter to advise prescription sent . Follow up regarding importance of blood sugar control and monitoring o Reports patient taking - Metformin 1000 mg twice daily - Lantus 55 units once daily - Humalog 12 units before each meal PLUS sliding scale as given by Endocrinologist on 11/19 . Will mail patient blood pressure log, blood sugar log & hypertension monitoring education as requested  Patient Self Care Activities:  . Self administers medications as prescribed o Using weekly pillbox and calendar as adherence tools . Attends all scheduled provider appointments o Next appointment with Endocrinology on 12/30 o Next appointment with Cardiology on 12/29 o Next appointment with Podiatry on 1/6 . Calls pharmacy for medication refills . Calls provider office for new concerns or questions . Patient to check blood sugar as directed by Endocrinologist and keep log  Please see past updates related to this goal by clicking on the "Past Updates" button in the selected goal         The patient verbalized understanding of instructions provided today and declined a print copy of patient instruction materials.   Telephone follow up appointment with care management team member scheduled for: 1/8 at 2 pm  Harlow Asa, PharmD, [place] [phone]

## 2019-04-30 DIAGNOSIS — E1159 Type 2 diabetes mellitus with other circulatory complications: Secondary | ICD-10-CM | POA: Diagnosis not present

## 2019-04-30 DIAGNOSIS — E1169 Type 2 diabetes mellitus with other specified complication: Secondary | ICD-10-CM | POA: Diagnosis not present

## 2019-04-30 DIAGNOSIS — E1151 Type 2 diabetes mellitus with diabetic peripheral angiopathy without gangrene: Secondary | ICD-10-CM | POA: Diagnosis not present

## 2019-05-01 DIAGNOSIS — Z794 Long term (current) use of insulin: Secondary | ICD-10-CM | POA: Diagnosis not present

## 2019-05-01 DIAGNOSIS — E1159 Type 2 diabetes mellitus with other circulatory complications: Secondary | ICD-10-CM | POA: Diagnosis not present

## 2019-05-01 DIAGNOSIS — E1129 Type 2 diabetes mellitus with other diabetic kidney complication: Secondary | ICD-10-CM | POA: Diagnosis not present

## 2019-05-01 DIAGNOSIS — E1165 Type 2 diabetes mellitus with hyperglycemia: Secondary | ICD-10-CM | POA: Diagnosis not present

## 2019-05-01 DIAGNOSIS — E782 Mixed hyperlipidemia: Secondary | ICD-10-CM | POA: Diagnosis not present

## 2019-05-02 ENCOUNTER — Ambulatory Visit: Payer: Self-pay | Admitting: Licensed Clinical Social Worker

## 2019-05-02 DIAGNOSIS — I10 Essential (primary) hypertension: Secondary | ICD-10-CM

## 2019-05-02 DIAGNOSIS — F251 Schizoaffective disorder, depressive type: Secondary | ICD-10-CM

## 2019-05-02 DIAGNOSIS — E785 Hyperlipidemia, unspecified: Secondary | ICD-10-CM | POA: Diagnosis not present

## 2019-05-02 DIAGNOSIS — E1169 Type 2 diabetes mellitus with other specified complication: Secondary | ICD-10-CM

## 2019-05-02 DIAGNOSIS — IMO0002 Reserved for concepts with insufficient information to code with codable children: Secondary | ICD-10-CM

## 2019-05-02 DIAGNOSIS — E1165 Type 2 diabetes mellitus with hyperglycemia: Secondary | ICD-10-CM | POA: Diagnosis not present

## 2019-05-02 DIAGNOSIS — E1149 Type 2 diabetes mellitus with other diabetic neurological complication: Secondary | ICD-10-CM

## 2019-05-04 NOTE — Chronic Care Management (AMB) (Signed)
Chronic Care Management    Clinical Social Work Follow Up Note  05/04/2019 Name: JEANETT ANTONOPOULOS MRN: [id number] DOB: March 19, 1966  PINKI ROTTMAN is a 54 y.o. year old female who is a primary care patient of [name], DO. The CCM team was consulted for assistance with [employer] .   Review of patient status, including review of consultants reports, other relevant assessments, and collaboration with appropriate care team members and the patient's provider was performed as part of comprehensive patient evaluation and provision of chronic care management services.    SDOH (Social Determinants of Health) screening performed today: None. See Care Plan for related entries.   Advanced Directives Status: <no information> See Care Plan for related entries.   Outpatient Encounter Medications as of 05/02/2019  Medication Sig  . ARIPiprazole (ABILIFY) 10 MG tablet Take 10 mg by mouth daily.  [name] atorvastatin (LIPITOR) 40 MG tablet Take 1 tablet (40 mg total) by mouth daily.  . B-D UF III MINI PEN NEEDLES 31G X 5 MM MISC USE AS DIRECTED  . Blood Glucose Monitoring Suppl (ONETOUCH VERIO) w/Device KIT Use to check blood sugar up to 3 x daily  . buPROPion (WELLBUTRIN XL) 150 MG 24 hr tablet Take 150 mg by mouth daily. Take in AM  . HUMALOG KWIKPEN 100 UNIT/ML KwikPen Inject 0.05 mLs (5 Units total) into the skin 3 (three) times daily with meals. + Sliding scale (Patient taking differently: Inject 12 Units into the skin 3 (three) times daily with meals. + Sliding scale)  . hydrochlorothiazide (HYDRODIURIL) 12.5 MG tablet Take 1 tablet (12.5 mg total) by mouth daily.  . Insulin Glargine (LANTUS SOLOSTAR) 100 UNIT/ML Solostar Pen Inject 40 Units into the skin daily. Adjust dose as advised (Patient taking differently: Inject 55 Units into the skin daily. )  . Lancets (ONETOUCH ULTRASOFT) lancets Use to check blood sugar up to 3 x daily  . lisinopril (ZESTRIL) 20 MG tablet Take 1  tablet (20 mg total) by mouth daily.  . metFORMIN (GLUCOPHAGE) 1000 MG tablet Take 1 tablet (1,000 mg total) by mouth 2 (two) times daily with a meal.  . ONETOUCH VERIO test strip Check blood sugar up to 5 x daily as needed  . PARoxetine (PAXIL) 20 MG tablet Take 1 tablet (20 mg total) by mouth daily. Prescribed by [facility]   No facility-administered encounter medications on file as of 05/02/2019.     Goals Addressed    . SW- "I want to manage my health conditions and stress better." (pt-stated)       Current Barriers:  . Financial constraints related to managing health care expenses . Limited social support . ADL IADL limitations . Social Isolation . Inability to perform ADL's independently . Inability to perform IADL's independently  Clinical Social Work Clinical Goal(s):  [name] Over the next 120 days, patient will work with SW to address concerns related to gaining additional support within the home and local community resource education   Interventions: . Discussed plans with patient for ongoing care management follow up and provided patient with direct contact information for care management team . Provided education and assistance to client regarding Advanced Directives. . A voluntary and extensive discussion about advanced care planning including explanation and discussion of advanced was undertaken with the patient and daughter.  Explanation regarding healthcare proxy and living will was reviewed and packet with forms with explanation of how to fill them out was given.   . Provided education to patient/caregiver  regarding level of care options. . Provided education to patient/caregiver about Hospice and/or Palliative Care services . LCSW discussed coping skills for managing health care. SW used active and reflective listening, validated patient's feelings/concerns, and provided emotional support. LCSW provided self-care education to help manage her multiple health conditions and  improve her mood . Positive reinforcement provided for keeping sugars low, keeping up with ALL medical appointments and improving socialization.  Social research officer, government education provided briefly   Patient Self Care Activities:  . Attends all scheduled provider appointments . Calls provider office for new concerns or questions . Lacks social connections  Initial goal documentation      Follow Up Plan: SW will follow up with patient by phone over the next quarter  [name], [place], MSW, [email or web address] Phone: [phone]

## 2019-05-05 DIAGNOSIS — M14672 Charcot's joint, left ankle and foot: Secondary | ICD-10-CM | POA: Diagnosis not present

## 2019-05-07 ENCOUNTER — Other Ambulatory Visit: Payer: Self-pay | Admitting: Oncology

## 2019-05-07 DIAGNOSIS — R911 Solitary pulmonary nodule: Secondary | ICD-10-CM

## 2019-05-07 NOTE — Progress Notes (Signed)
  Pulmonary Nodule Clinic Telephone Note  Received referral from Dr. Parks Ranger.  Patient was referred by PCP for follow-up of recent CT angiogram completed on 04/12/2019.  Patient complained of shortness of breath and imaging completed to rule out pulmonary embolus.  Imaging revealed no evidence of a PE but several scattered 2 to 3 mm solid nodules in right upper lobe and a 1.7 cm nodule in the body of the adrenal gland.  Per radiologist, it is recommended she be followed up with a CT scan without contrast in approximately 12 months if he is considered high risk.  I personally reviewed his imaging from 04/12/2019.  There were no additional CT scans available for comparison.  Patient has had several chest x-rays in the past.  Nodules are noted to be too small to be evaluated on an x-ray.  I would recommend a repeat follow-up with noncontrasted CT scanning of his chest in approximately 12 months from previous.  Per medical chart review, patient is a former smoker.  She quit approximately 10 years ago.  She has a 90-pack-year history of smoking. She is HIGH risk.  High risk factors include: History of heavy smoking, exposure to asbestos, radium or uranium, personal family history of lung cancer, older age, sex (females greater than males), race (black and native [place] greater than weight), marginal speculation, upper lobe location, multiplicity (less than 5 nodules increases risk for malignancy) and emphysema and/or pulmonary fibrosis.   This recommendation follows the consensus statement: Guidelines for Management of Incidental Pulmonary Nodules Detected on CT Images: From the Fleischner Society 2017; Radiology 2017; 284:228-243.    I have placed order for CT scan without contrast to be completed approximately 1 year from previous CT scan.    CT scan ordered today. I have sent a scheduling message to get her scheduled in approximately 1 year. [facility], lung navigator will touch base with  patient to discuss appointment.  I will touch base with patient and schedule him/her virtually for results of the CT scan and recommendations per Fleischner's guidelines and our pulmonary nodule clinic.  Faythe Casa, NP 05/07/2019 10:48 AM

## 2019-05-08 DIAGNOSIS — M14672 Charcot's joint, left ankle and foot: Secondary | ICD-10-CM | POA: Diagnosis not present

## 2019-05-08 DIAGNOSIS — E1142 Type 2 diabetes mellitus with diabetic polyneuropathy: Secondary | ICD-10-CM | POA: Diagnosis not present

## 2019-05-08 DIAGNOSIS — L97522 Non-pressure chronic ulcer of other part of left foot with fat layer exposed: Secondary | ICD-10-CM | POA: Diagnosis not present

## 2019-05-08 DIAGNOSIS — L97312 Non-pressure chronic ulcer of right ankle with fat layer exposed: Secondary | ICD-10-CM | POA: Diagnosis not present

## 2019-05-08 DIAGNOSIS — B351 Tinea unguium: Secondary | ICD-10-CM | POA: Diagnosis not present

## 2019-05-10 ENCOUNTER — Ambulatory Visit: Payer: Self-pay | Admitting: Pharmacist

## 2019-05-10 ENCOUNTER — Telehealth: Payer: Self-pay

## 2019-05-10 NOTE — Chronic Care Management (AMB) (Signed)
  Chronic Care Management   Follow Up Note   05/10/2019 Name: [name] MRN: [id number] DOB: October 26, 1965  Referred by: [name], DO Reason for referral : Chronic Care Management (Patient Phone Call)   [name] is a 54 y.o. year old female who is a primary care patient of [name], DO. The CCM team was consulted for assistance with chronic disease management and care coordination needs.    Was unable to reach patient via telephone today and unable to leave a message as no voicemail is setup.  Plan  The care management team will reach out to the patient again over the next 30 days.   Harlow Asa, PharmD, [employer] [phone]

## 2019-05-14 ENCOUNTER — Ambulatory Visit: Payer: Medicare Other

## 2019-05-14 NOTE — Progress Notes (Deleted)
Subjective:   [name] is a 54 y.o. female who presents for Medicare Annual (Subsequent) preventive examination.  This visit is being conducted via phone call  - after an attmept to do on video chat - due to the COVID-19 pandemic. This patient has given me verbal consent via phone to conduct this visit, patient states they are participating from their home address. Some vital signs may be absent or patient reported.   Patient identification: identified by name, DOB, and current address.    Review of Systems:         Objective:     Vitals: There were no vitals taken for this visit.  There is no height or weight on file to calculate BMI.  Advanced Directives 04/26/2019 04/12/2019 12/11/2018 11/30/2018 10/18/2018 10/18/2018 10/17/2018  Does Patient Have a Medical Advance Directive? No No No No No No No  Would patient like information on creating a medical advance directive? No - Patient declined No - Patient declined No - Patient declined No - Patient declined No - Patient declined No - Patient declined -    Tobacco Social History   Tobacco Use  Smoking Status Former Smoker  . Packs/day: 3.00  . Years: 30.00  . Pack years: 90.00  . Types: Cigarettes  . Quit date: 05/02/2009  . Years since quitting: 10.0  Smokeless Tobacco Former Systems developer  Tobacco Comment   Pt reported  quitting in 2011     Counseling given: Not Answered Comment: Pt reported  quitting in 2011   Clinical Intake:                 Nutrition Risk Assessment:  Has the patient had any N/V/D within the last 2 months?  {YES/NO:21197} Does the patient have any non-healing wounds?  {YES/NO:21197} Has the patient had any unintentional weight loss or weight gain?  {YES/NO:21197}  Diabetes:  Is the patient diabetic?  {YES/NO:21197} If diabetic, was a CBG obtained today?  {YES/NO:21197} Did the patient bring in their glucometer from home?  {YES/NO:21197} How often do you monitor your CBG's? ***.    Financial Strains and Diabetes Management:  Are you having any financial strains with the device, your supplies or your medication? {YES/NO:21197}.  Does the patient want to be seen by Chronic Care Management for management of their diabetes?  {YES/NO:21197} Would the patient like to be referred to a Nutritionist or for Diabetic Management?  {YES/NO:21197}  Diabetic Exams:  Diabetic Eye Exam: Overdue for diabetic eye exam. Pt has been advised about the importance in completing this exam.   Diabetic Foot Exam: Completed 03/18/2019.         Past Medical History:  Diagnosis Date  . Depression   . Diabetes mellitus without complication ([place])   . Hyperlipidemia   . Hypertension   . Neuromuscular disorder ([place])   . Schizo affective schizophrenia ([place])    abilify and pazil   Past Surgical History:  Procedure Laterality Date  . CHOLECYSTECTOMY    . IRRIGATION AND DEBRIDEMENT FOOT Right 02/26/2016   Procedure: RIGHT 2ND TOE DEBRIDEMENT;  Surgeon: Samara Deist, DPM;  Location: [facility] ORS;  Service: Podiatry;  Laterality: Right;  . IRRIGATION AND DEBRIDEMENT FOOT Left 12/13/2018   Procedure: IRRIGATION AND DEBRIDEMENT FOOT;  Surgeon: Caroline More, DPM;  Location: [facility] ORS;  Service: Podiatry;  Laterality: Left;   Family History  Problem Relation Age of Onset  . Breast cancer Cousin        maternal cousin  . Hypertension  Mother   . Hyperthyroidism Mother   . Diabetes Maternal Grandmother   . Hypertension Maternal Grandmother   . Cancer Maternal Grandmother        unknown type  . Diabetes Maternal Grandfather   . Hypertension Maternal Grandfather   . Diabetes Paternal Grandmother   . Hypertension Paternal Grandmother   . Diabetes Paternal Grandfather   . Hypertension Paternal Grandfather    Social History   Socioeconomic History  . Marital status: Divorced    Spouse name: Not on file  . Number of children: 2  . Years of education: Not on file  . Highest education  level: Not on file  Occupational History  . Not on file  Tobacco Use  . Smoking status: Former Smoker    Packs/day: 3.00    Years: 30.00    Pack years: 90.00    Types: Cigarettes    Quit date: 05/02/2009    Years since quitting: 10.0  . Smokeless tobacco: Former Systems developer  . Tobacco comment: Pt reported  quitting in 2011  Substance and Sexual Activity  . Alcohol use: No  . Drug use: No  . Sexual activity: Not Currently  Other Topics Concern  . Not on file  Social History Narrative   At home with mom, 40. Lives in brothers house who moved out. Sister comes by every other day to check on patient and mom.   Social Determinants of Health   Financial Resource Strain:   . Difficulty of Paying Living Expenses: Not on file  Food Insecurity:   . Worried About Charity fundraiser in the Last Year: Not on file  . Ran Out of Food in the Last Year: Not on file  Transportation Needs:   . Lack of Transportation (Medical): Not on file  . Lack of Transportation (Non-Medical): Not on file  Physical Activity:   . Days of Exercise per Week: Not on file  . Minutes of Exercise per Session: Not on file  Stress:   . Feeling of Stress : Not on file  Social Connections:   . Frequency of Communication with Friends and Family: Not on file  . Frequency of Social Gatherings with Friends and Family: Not on file  . Attends Religious Services: Not on file  . Active Member of Clubs or Organizations: Not on file  . Attends Archivist Meetings: Not on file  . Marital Status: Not on file    Outpatient Encounter Medications as of 05/14/2019  Medication Sig  . ARIPiprazole (ABILIFY) 10 MG tablet Take 10 mg by mouth daily.  [name] atorvastatin (LIPITOR) 40 MG tablet Take 1 tablet (40 mg total) by mouth daily.  . B-D UF III MINI PEN NEEDLES 31G X 5 MM MISC USE AS DIRECTED  . Blood Glucose Monitoring Suppl (ONETOUCH VERIO) w/Device KIT Use to check blood sugar up to 3 x daily  . buPROPion (WELLBUTRIN XL) 150  MG 24 hr tablet Take 150 mg by mouth daily. Take in AM  . HUMALOG KWIKPEN 100 UNIT/ML KwikPen Inject 0.05 mLs (5 Units total) into the skin 3 (three) times daily with meals. + Sliding scale (Patient taking differently: Inject 12 Units into the skin 3 (three) times daily with meals. + Sliding scale)  . hydrochlorothiazide (HYDRODIURIL) 12.5 MG tablet Take 1 tablet (12.5 mg total) by mouth daily.  . Insulin Glargine (LANTUS SOLOSTAR) 100 UNIT/ML Solostar Pen Inject 40 Units into the skin daily. Adjust dose as advised (Patient taking differently: Inject 55 Units  into the skin daily. )  . Lancets (ONETOUCH ULTRASOFT) lancets Use to check blood sugar up to 3 x daily  . lisinopril (ZESTRIL) 20 MG tablet Take 1 tablet (20 mg total) by mouth daily.  . metFORMIN (GLUCOPHAGE) 1000 MG tablet Take 1 tablet (1,000 mg total) by mouth 2 (two) times daily with a meal.  . ONETOUCH VERIO test strip Check blood sugar up to 5 x daily as needed  . PARoxetine (PAXIL) 20 MG tablet Take 1 tablet (20 mg total) by mouth daily. Prescribed by [facility]   No facility-administered encounter medications on file as of 05/14/2019.    Activities of Daily Living In your present state of health, do you have any difficulty performing the following activities: 12/11/2018 12/11/2018  Hearing? - N  Vision? - N  Difficulty concentrating or making decisions? - N  Walking or climbing stairs? - Y  Dressing or bathing? - N  Doing errands, shopping? Y -  Some recent data might be hidden    Patient Care Team: [name], DO as PCP - General (Family Medicine) Floria Raveling, MD as Referring Physician (Psychiatry) Minor, Dalbert Garnet, RN as Case Manager [place], Virl Diamond, [facility] as Pharmacist (Pharmacist) Edrick Kins, MD as Rounding Team (Internal Medicine)    Assessment:   This is a routine wellness examination for [place].  Exercise Activities and Dietary recommendations    Goals    . DIET - INCREASE WATER  INTAKE     Recommend continue drinking at least 6-8 glasses of water a day     . PharmD - Medication Management     Current Barriers:  . Financial Barriers . Non adherence to prescribed medication regimen  Pharmacist Clinical Goal(s):  [name] Over the next 30 days, patient will work with [name] to address needs related to optimization of medication regimen and improvement of medication adherence  Interventions: . Perform chart review o Patient seen in [facility] ED on 12/11 for hypertension/chest pain - Patient instructed to:  [name] Continue lisinopril 20 mg once daily . START hydrochlorothiazide 12.5 mg once daily o Patient seen by PCP on 12/16 - Denied having started hydrochlorothiazide due to transportation issue - Advised to:  [name] Continue lisinopril 20 mg once daily . Start hydrochlorothiazide as ordered by ED provider  . Reschedule canceled appointment with Cardiologist o Patient seen in [facility] ED on 12/25 for leg pain/numbness . Daughter/caregiver confirms patient currently staying with daughter. Reports improvement in patient's medication adherence as she manages patient's medications and insulin . Counsel on importance of patient's blood pressure monitoring and control o Reports patient currently taking lisinopril 20 mg once daily o Currently NOT taking HCTZ 12.5 mg daily as used up supply ordered. Note ED provider ordered only a 7-day supply on 12/11. o Denies keeping log of blood pressures, but reports systolic readings typically ranging 150-180s, but has been as high as 200 o Review importance of seeking Urgent Medical Care if chest pain, shortness of breath, back pain, numbness/weakness, change in vision, or difficulty speaking with reading ?180/120 - Daughter verbalizes understanding and notes patient has gone to ED when had symptoms of headaches, numbness, shortness of breath and pain with these elevated BP readings o Daughter states that she believes patient rescheduled  Cardiology appointment with Dr. Ubaldo Glassing for 1/6. Note upcoming appointment in patient's chart with Podiatry for 1/6, but not Cardiology. Encourage patient to call to verify/schedule appointment. - Review chart -Cardiology appointment now scheduled for 12/29. [name] Coordination of  care with PCP. Request new prescription for HCTZ 12.5 mg once daily to be sent to patient's [place] in [place] for patient to continue as directed.  o PCP sends prescription to pharmacy.  o Follow up call to daughter to advise prescription sent . Follow up regarding importance of blood sugar control and monitoring o Reports patient taking - Metformin 1000 mg twice daily - Lantus 55 units once daily - Humalog 12 units before each meal PLUS sliding scale as given by Endocrinologist on 11/19 . Will mail patient blood pressure log, blood sugar log & hypertension monitoring education as requested  Patient Self Care Activities:  . Self administers medications as prescribed o Using weekly pillbox and calendar as adherence tools . Attends all scheduled provider appointments o Next appointment with Endocrinology on 12/30 o Next appointment with Cardiology on 12/29 o Next appointment with Podiatry on 1/6 . Calls pharmacy for medication refills . Calls provider office for new concerns or questions . Patient to check blood sugar as directed by Endocrinologist and keep log  Please see past updates related to this goal by clicking on the "Past Updates" button in the selected goal      . RNCM- I want to get my diabetes back on track (pt-stated)     Current Barriers:  [name] Knowledge Deficits related to basic Diabetes pathophysiology and self care/management . Knowledge Deficits related to medications used for management of diabetes . Does not use cbg meter   Case Manager Clinical Goal(s):  Over the next 90 days, patient will demonstrate improved adherence to prescribed treatment plan for diabetes self care/management as  evidenced by:  . daily monitoring and recording of CBG  . adherence to ADA/ carb modified diet . exercise 3 days/week . adherence to prescribed medication regimen  Interventions:  . Patient recently in the ED related to increased blood pressure, new medication added at that time patient's daughter plans to pick this up today.  . Daughter giving patient all of her daily medications and patient reports her compliance to be a lot better with her daughter helping her.  . Patient reporting blood sugars between 120-150. Patient states she is also trying to do better with her diet.  . Patient reports she has disconnected her phone and the best way to reach her is to call her daughter because that is where she is living at this time.  . Daughter expressed unable to reach Cardiologist office for an appointment, call placed to [facility] Cardiology on hold for >87mns Was able to set patient up appointment [facility] let her know about the appointment.  Verbalized understanding.  . Encouraged patient's daughter to make an appointment with PCP for ED f/u . Patient is continuing with PT who is also monitoring her b/p when in the home.  . Talked with daughter briefly about care giver stress and plan to mail her caregiver stress education. [address] [place] [id number] Patient Self Care Activities:  . UNABLE to independently self manage diabetes as evidenced by A1C >12  Please see past updates related to this goal by clicking on the "Past Updates" button in the selected goal      . SW- "I want to manage my health conditions and stress better." (pt-stated)     Current Barriers:  . Financial constraints related to managing health care expenses . Limited social support . ADL IADL limitations . Social Isolation . Inability to perform  ADL's independently . Inability to perform IADL's independently  Clinical Social Work Clinical Goal(s):  [name] Over the  next 120 days, patient will work with SW to address concerns related to gaining additional support within the home and local community resource education   Interventions: . Discussed plans with patient for ongoing care management follow up and provided patient with direct contact information for care management team . Provided education and assistance to client regarding Advanced Directives. . A voluntary and extensive discussion about advanced care planning including explanation and discussion of advanced was undertaken with the patient and daughter.  Explanation regarding healthcare proxy and living will was reviewed and packet with forms with explanation of how to fill them out was given.   . Provided education to patient/caregiver regarding level of care options. . Provided education to patient/caregiver about Hospice and/or Palliative Care services . LCSW discussed coping skills for managing health care. SW used active and reflective listening, validated patient's feelings/concerns, and provided emotional support. LCSW provided self-care education to help manage her multiple health conditions and improve her mood . Positive reinforcement provided for keeping sugars low, keeping up with ALL medical appointments and improving socialization.   Patient Self Care Activities:  . Attends all scheduled provider appointments . Calls provider office for new concerns or questions . Lacks social connections  Initial goal documentation        Fall Risk: Fall Risk  02/05/2019 04/30/2018 01/03/2018 12/28/2017 12/13/2017  Falls in the past year? 0 0 No Yes No  Comment - - - fell due to weakness from UTI and pneumonia almost 2 weeks ago -  Number falls in past yr: - - - 1 -  Injury with Fall? - - - No -  Risk for fall due to : Impaired mobility - - History of fall(s) -  Risk for fall due to: Comment - - - - -  Follow up - Falls evaluation completed - - -    FALL RISK PREVENTION PERTAINING TO THE  HOME:  Any stairs in or around the home? {YES/NO:21197} If so, are there any without handrails? {YES/NO:21197}  Home free of loose throw rugs in walkways, pet beds, electrical cords, etc? {YES/NO:21197} Adequate lighting in your home to reduce risk of falls? {YES/NO:21197}  ASSISTIVE DEVICES UTILIZED TO PREVENT FALLS:  Life alert? {YES/NO:21197} Use of a cane, walker or w/c? {YES/NO:21197} Grab bars in the bathroom? {YES/NO:21197} Shower chair or bench in shower? {YES/NO:21197} Elevated toilet seat or a handicapped toilet? {YES/NO:21197}  TIMED UP AND GO:  Unable to perform    Depression Screen PHQ 2/9 Scores 02/05/2019 10/26/2018 07/27/2018 04/30/2018  PHQ - 2 Score 0 0 0 0  PHQ- 9 Score 3 2 - -     Cognitive Function     6CIT Screen 10/24/2017  What Year? 0 points  What month? 0 points  What time? 0 points  Count back from 20 0 points  Months in reverse 0 points  Repeat phrase 0 points  Total Score 0    Immunization History  Administered Date(s) Administered  . Influenza,inj,Quad PF,6+ Mos 01/18/2017, 01/03/2018, 02/05/2019  . Pneumococcal Polysaccharide-23 10/24/2017    Qualifies for Shingles Vaccine? Yes  Zostavax completed n/a. Due for Shingrix. Education has been provided regarding the importance of this vaccine. Pt has been advised to call insurance company to determine out of pocket expense. Advised may also receive vaccine at local pharmacy or Health Dept. Verbalized acceptance and understanding.  Tdap: up to date  Flu Vaccine: up to date   Pneumococcal Vaccine: up to date   Screening Tests Health Maintenance  Topic Date Due  . OPHTHALMOLOGY EXAM  02/16/1974  . MAMMOGRAM  08/17/2017  . HEMOGLOBIN A1C  06/16/2019  . FOOT EXAM  03/17/2020  . PAP SMEAR-Modifier  01/03/2021  . COLONOSCOPY  09/16/2025  . TETANUS/TDAP  09/16/2025  . INFLUENZA VACCINE  Completed  . PNEUMOCOCCAL POLYSACCHARIDE VACCINE AGE 12-64 HIGH RISK  Completed  . Hepatitis C  Screening  Completed  . HIV Screening  Completed    Cancer Screenings:  Colorectal Screening: Completed 09/17/2015. Repeat every 10 years  Mammogram: ordered, patient advised to scheduled   Bone Density: not indicated   Lung Cancer Screening: (Low Dose CT Chest recommended if Age 63-80 years, 30 pack-year currently smoking OR have quit w/in 15years.) {DOES NOT does:27190::"does not"} qualify.   Lung Cancer Screening Referral: An Epic message has been sent to Burgess Estelle, RN (Oncology Nurse Navigator) regarding the possible need for this exam. Raquel Sarna will review the patient's chart to determine if the patient truly qualifies for the exam. If the patient qualifies, Raquel Sarna will order the Low Dose CT of the chest to facilitate the scheduling of this exam.  Additional Screening:  Hepatitis C Screening: does qualify; Completed 11/07/2017  Dental Screening: Recommended annual dental exams for proper oral hygiene   Community Resource Referral:  CRR required this visit?  {YES/NO:21197}      Plan:  I have personally reviewed and addressed the Medicare Annual Wellness questionnaire and have noted the following in the patient's chart:  A. Medical and social history B. Use of alcohol, tobacco or illicit drugs  C. Current medications and supplements D. Functional ability and status E.  Nutritional status F.  Physical activity G. Advance directives H. List of other physicians I.  Hospitalizations, surgeries, and ER visits in previous 12 months J.  [place] such as hearing and vision if needed, cognitive and depression L. Referrals and appointments   In addition, I have reviewed and discussed with patient certain preventive protocols, quality metrics, and best practice recommendations. A written personalized care plan for preventive services as well as general preventive health recommendations were provided to patient.   Signed,    Bevelyn Ngo, LPN  0/35/5974 Nurse  Health Advisor   Nurse Notes:  None   Ms. Shambaugh , Thank you for taking time to come for your Medicare Wellness Visit. I appreciate your ongoing commitment to your health goals. Please review the following plan we discussed and let me know if I can assist you in the future.   Screening recommendations/referrals: Colonoscopy: completed 2017 Mammogram: Please call [phone] to schedule your mammogram.  Bone Density: not indicated  Recommended yearly ophthalmology/optometry visit for glaucoma screening and checkup Recommended yearly dental visit for hygiene and checkup  Vaccinations: Influenza vaccine: up to date Pneumococcal vaccine: not indicated  Tdap vaccine: up to date  Shingles vaccine: shingrix eligible     Advanced directives: ***  Conditions/risks identified: diabetic   Next appointment: follow up in one year for your annual wellness visit  Preventive Care 40-64 Years, Female Preventive care refers to lifestyle choices and visits with your health care provider that can promote health and wellness. What does preventive care include?  A yearly physical exam. This is also called an annual well check.  Dental exams once or twice a year.  Routine eye exams. Ask your health care provider how often you should have your eyes  checked.  Personal lifestyle choices, including:  Daily care of your teeth and gums.  Regular physical activity.  Eating a healthy diet.  Avoiding tobacco and drug use.  Limiting alcohol use.  Practicing safe sex.  Taking low-dose aspirin daily starting at age 66.  Taking vitamin and mineral supplements as recommended by your health care provider. What happens during an annual well check? The services and screenings done by your health care provider during your annual well check will depend on your age, overall health, lifestyle risk factors, and family history of disease. Counseling  Your health care provider may ask you questions  about your:  Alcohol use.  Tobacco use.  Drug use.  Emotional well-being.  Home and relationship well-being.  Sexual activity.  Eating habits.  Work and work Statistician.  Method of birth control.  Menstrual cycle.  Pregnancy history. Screening  You may have the following tests or measurements:  Height, weight, and BMI.  Blood pressure.  Lipid and cholesterol levels. These may be checked every 5 years, or more frequently if you are over 18 years old.  Skin check.  Lung cancer screening. You may have this screening every year starting at age 40 if you have a 30-pack-year history of smoking and currently smoke or have quit within the past 15 years.  Fecal occult blood test (FOBT) of the stool. You may have this test every year starting at age 30.  Flexible sigmoidoscopy or colonoscopy. You may have a sigmoidoscopy every 5 years or a colonoscopy every 10 years starting at age 27.  Hepatitis C blood test.  Hepatitis B blood test.  Sexually transmitted disease (STD) testing.  Diabetes screening. This is done by checking your blood sugar (glucose) after you have not eaten for a while (fasting). You may have this done every 1-3 years.  Mammogram. This may be done every 1-2 years. Talk to your health care provider about when you should start having regular mammograms. This may depend on whether you have a family history of breast cancer.  BRCA-related cancer screening. This may be done if you have a family history of breast, ovarian, tubal, or peritoneal cancers.  Pelvic exam and Pap test. This may be done every 3 years starting at age 28. Starting at age 61, this may be done every 5 years if you have a Pap test in combination with an HPV test.  Bone density scan. This is done to screen for osteoporosis. You may have this scan if you are at high risk for osteoporosis. Discuss your test results, treatment options, and if necessary, the need for more tests with your  health care provider. Vaccines  Your health care provider may recommend certain vaccines, such as:  Influenza vaccine. This is recommended every year.  Tetanus, diphtheria, and acellular pertussis (Tdap, Td) vaccine. You may need a Td booster every 10 years.  Zoster vaccine. You may need this after age 68.  Pneumococcal 13-valent conjugate (PCV13) vaccine. You may need this if you have certain conditions and were not previously vaccinated.  Pneumococcal polysaccharide (PPSV23) vaccine. You may need one or two doses if you smoke cigarettes or if you have certain conditions. Talk to your health care provider about which screenings and vaccines you need and how often you need them. This information is not intended to replace advice given to you by your health care provider. Make sure you discuss any questions you have with your health care provider. Document Released: 05/15/2015 Document Revised: 01/06/2016 Document Reviewed:  02/17/2015 Elsevier Interactive Patient Education  2017 [place] Prevention in the Home Falls can cause injuries. They can happen to people of all ages. There are many things you can do to make your home safe and to help prevent falls. What can I do on the outside of my home?  Regularly fix the edges of walkways and driveways and fix any cracks.  Remove anything that might make you trip as you walk through a door, such as a raised step or threshold.  Trim any bushes or trees on the path to your home.  Use bright outdoor lighting.  Clear any walking paths of anything that might make someone trip, such as rocks or tools.  Regularly check to see if handrails are loose or broken. Make sure that both sides of any steps have handrails.  Any raised decks and porches should have guardrails on the edges.  Have any leaves, snow, or ice cleared regularly.  Use sand or salt on walking paths during winter.  Clean up any spills in your garage right away.  This includes oil or grease spills. What can I do in the bathroom?  Use night lights.  Install grab bars by the toilet and in the tub and shower. Do not use towel bars as grab bars.  Use non-skid mats or decals in the tub or shower.  If you need to sit down in the shower, use a plastic, non-slip stool.  Keep the floor dry. Clean up any water that spills on the floor as soon as it happens.  Remove soap buildup in the tub or shower regularly.  Attach bath mats securely with double-sided non-slip rug tape.  Do not have throw rugs and other things on the floor that can make you trip. What can I do in the bedroom?  Use night lights.  Make sure that you have a light by your bed that is easy to reach.  Do not use any sheets or blankets that are too big for your bed. They should not hang down onto the floor.  Have a firm chair that has side arms. You can use this for support while you get dressed.  Do not have throw rugs and other things on the floor that can make you trip. What can I do in the kitchen?  Clean up any spills right away.  Avoid walking on wet floors.  Keep items that you use a lot in easy-to-reach places.  If you need to reach something above you, use a strong step stool that has a grab bar.  Keep electrical cords out of the way.  Do not use floor polish or wax that makes floors slippery. If you must use wax, use non-skid floor wax.  Do not have throw rugs and other things on the floor that can make you trip. What can I do with my stairs?  Do not leave any items on the stairs.  Make sure that there are handrails on both sides of the stairs and use them. Fix handrails that are broken or loose. Make sure that handrails are as long as the stairways.  Check any carpeting to make sure that it is firmly attached to the stairs. Fix any carpet that is loose or worn.  Avoid having throw rugs at the top or bottom of the stairs. If you do have throw rugs, attach them  to the floor with carpet tape.  Make sure that you have a light switch at the  top of the stairs and the bottom of the stairs. If you do not have them, ask someone to add them for you. What else can I do to help prevent falls?  Wear shoes that:  Do not have high heels.  Have rubber bottoms.  Are comfortable and fit you well.  Are closed at the toe. Do not wear sandals.  If you use a stepladder:  Make sure that it is fully opened. Do not climb a closed stepladder.  Make sure that both sides of the stepladder are locked into place.  Ask someone to hold it for you, if possible.  Clearly mark and make sure that you can see:  Any grab bars or handrails.  First and last steps.  Where the edge of each step is.  Use tools that help you move around (mobility aids) if they are needed. These include:  Canes.  Walkers.  Scooters.  Crutches.  Turn on the lights when you go into a dark area. Replace any light bulbs as soon as they burn out.  Set up your furniture so you have a clear path. Avoid moving your furniture around.  If any of your floors are uneven, fix them.  If there are any pets around you, be aware of where they are.  Review your medicines with your doctor. Some medicines can make you feel dizzy. This can increase your chance of falling. Ask your doctor what other things that you can do to help prevent falls. This information is not intended to replace advice given to you by your health care provider. Make sure you discuss any questions you have with your health care provider. Document Released: 02/12/2009 Document Revised: 09/24/2015 Document Reviewed: 05/23/2014 Elsevier Interactive Patient Education  2017 [employer].

## 2019-05-16 ENCOUNTER — Ambulatory Visit: Payer: Self-pay | Admitting: General Practice

## 2019-05-16 ENCOUNTER — Telehealth: Payer: Self-pay

## 2019-05-16 ENCOUNTER — Ambulatory Visit
Admission: EM | Admit: 2019-05-16 | Discharge: 2019-05-16 | Disposition: A | Discharge status: Home / Self Care | Payer: Medicare Other | Attending: Emergency Medicine | Admitting: Emergency Medicine

## 2019-05-16 ENCOUNTER — Other Ambulatory Visit: Payer: Self-pay

## 2019-05-16 DIAGNOSIS — Z20822 Contact with and (suspected) exposure to covid-19: Secondary | ICD-10-CM

## 2019-05-16 DIAGNOSIS — R6889 Other general symptoms and signs: Secondary | ICD-10-CM

## 2019-05-16 MED ORDER — ONDANSETRON HCL 4 MG PO TABS: 4.0000 mg | ORAL_TABLET | Freq: Four times a day (QID) | ORAL | 0 refills | Status: DC

## 2019-05-16 NOTE — Chronic Care Management (AMB) (Signed)
°  Chronic Care Management   Note  05/16/2019 Name: [name] MRN: [id number] DOB: 06/28/1965  Patient scheduled for CCM f/u call from [facility] who noted that patient is currently in ED. Will reschedule outreach call and follow up.   Follow up plan: The care management team will reach out to the patient again over the next 7 days.   Noreene Larsson RN, MSN, [place] Mobile: [phone]

## 2019-05-16 NOTE — ED Triage Notes (Signed)
Pt presents to UC w/ c/o nausea x2-3 days, headaches and feeling feverish since yesterday. Pt states bp has been eleveated x2 weeks. Pt states highest it's been is 494 systolic.

## 2019-05-16 NOTE — ED Provider Notes (Signed)
[place]   [id number] 05/16/19 Arrival Time: 28   CC: Flu like symptoms  SUBJECTIVE: History from: patient.  [name] is a 54 y.o. female who presents with headache, nausea, and feeling feverish x 2-3 days.  Denies sick exposure to COVID, flu or strep.  Denies recent travel.  Has not tried OTC medications.  Symptoms are made worse with eating.  Denies chills, sinus pain, rhinorrhea, sore throat, SOB, wheezing, chest pain, vomiting, changes in bowel or bladder habits.    ROS: As per HPI.  All other pertinent ROS negative.     Past Medical History:  Diagnosis Date  . Depression   . Diabetes mellitus without complication ([place])   . Hyperlipidemia   . Hypertension   . Neuromuscular disorder ([place])   . Schizo affective schizophrenia ([place])    abilify and pazil   Past Surgical History:  Procedure Laterality Date  . CHOLECYSTECTOMY    . IRRIGATION AND DEBRIDEMENT FOOT Right 02/26/2016   Procedure: RIGHT 2ND TOE DEBRIDEMENT;  Surgeon: Samara Deist, DPM;  Location: [facility] ORS;  Service: Podiatry;  Laterality: Right;  . IRRIGATION AND DEBRIDEMENT FOOT Left 12/13/2018   Procedure: IRRIGATION AND DEBRIDEMENT FOOT;  Surgeon: Caroline More, DPM;  Location: [facility] ORS;  Service: Podiatry;  Laterality: Left;   No Known Allergies No current facility-administered medications on file prior to encounter.   Current Outpatient Medications on File Prior to Encounter  Medication Sig Dispense Refill  . ARIPiprazole (ABILIFY) 10 MG tablet Take 10 mg by mouth daily.    [name] atorvastatin (LIPITOR) 40 MG tablet Take 1 tablet (40 mg total) by mouth daily. 90 tablet 3  . B-D UF III MINI PEN NEEDLES 31G X 5 MM MISC USE AS DIRECTED 100 each 3  . Blood Glucose Monitoring Suppl (ONETOUCH VERIO) w/Device KIT Use to check blood sugar up to 3 x daily 1 kit 0  . buPROPion (WELLBUTRIN XL) 150 MG 24 hr tablet Take 150 mg by mouth daily. Take in AM    . HUMALOG KWIKPEN 100 UNIT/ML KwikPen Inject  0.05 mLs (5 Units total) into the skin 3 (three) times daily with meals. + Sliding scale (Patient taking differently: Inject 12 Units into the skin 3 (three) times daily with meals. + Sliding scale) 15 mL 5  . hydrochlorothiazide (HYDRODIURIL) 12.5 MG tablet Take 1 tablet (12.5 mg total) by mouth daily. 30 tablet 2  . Insulin Glargine (LANTUS SOLOSTAR) 100 UNIT/ML Solostar Pen Inject 40 Units into the skin daily. Adjust dose as advised (Patient taking differently: Inject 55 Units into the skin daily. ) 15 mL 1  . Lancets (ONETOUCH ULTRASOFT) lancets Use to check blood sugar up to 3 x daily 100 each 12  . lisinopril (ZESTRIL) 20 MG tablet Take 1 tablet (20 mg total) by mouth daily. 90 tablet 3  . metFORMIN (GLUCOPHAGE) 1000 MG tablet Take 1 tablet (1,000 mg total) by mouth 2 (two) times daily with a meal. 180 tablet 3  . ONETOUCH VERIO test strip Check blood sugar up to 5 x daily as needed 450 each 3  . PARoxetine (PAXIL) 20 MG tablet Take 1 tablet (20 mg total) by mouth daily. Prescribed by [facility] 30 tablet 0   Social History   Socioeconomic History  . Marital status: Divorced    Spouse name: Not on file  . Number of children: 2  . Years of education: Not on file  . Highest education level: Not on file  Occupational History  .  Not on file  Tobacco Use  . Smoking status: Former Smoker    Packs/day: 3.00    Years: 30.00    Pack years: 90.00    Types: Cigarettes    Quit date: 05/02/2009    Years since quitting: 10.0  . Smokeless tobacco: Former Systems developer  . Tobacco comment: Pt reported  quitting in 2011  Substance and Sexual Activity  . Alcohol use: No  . Drug use: No  . Sexual activity: Not Currently  Other Topics Concern  . Not on file  Social History Narrative   At home with mom, 46. Lives in brothers house who moved out. Sister comes by every other day to check on patient and mom.   Social Determinants of Health   Financial Resource Strain:   . Difficulty of Paying Living  Expenses: Not on file  Food Insecurity:   . Worried About Charity fundraiser in the Last Year: Not on file  . Ran Out of Food in the Last Year: Not on file  Transportation Needs:   . Lack of Transportation (Medical): Not on file  . Lack of Transportation (Non-Medical): Not on file  Physical Activity:   . Days of Exercise per Week: Not on file  . Minutes of Exercise per Session: Not on file  Stress:   . Feeling of Stress : Not on file  Social Connections:   . Frequency of Communication with Friends and Family: Not on file  . Frequency of Social Gatherings with Friends and Family: Not on file  . Attends Religious Services: Not on file  . Active Member of Clubs or Organizations: Not on file  . Attends Archivist Meetings: Not on file  . Marital Status: Not on file  Intimate Partner Violence:   . Fear of Current or Ex-Partner: Not on file  . Emotionally Abused: Not on file  . Physically Abused: Not on file  . Sexually Abused: Not on file   Family History  Problem Relation Age of Onset  . Breast cancer Cousin        maternal cousin  . Hypertension Mother   . Hyperthyroidism Mother   . Diabetes Maternal Grandmother   . Hypertension Maternal Grandmother   . Cancer Maternal Grandmother        unknown type  . Diabetes Maternal Grandfather   . Hypertension Maternal Grandfather   . Diabetes Paternal Grandmother   . Hypertension Paternal Grandmother   . Diabetes Paternal Grandfather   . Hypertension Paternal Grandfather     OBJECTIVE:  Vitals:   05/16/19 1616  BP: (!) 146/75  Pulse: (!) 106  Resp: 18  Temp: 97.7 F (36.5 C)  TempSrc: Oral  SpO2: 98%     General appearance: alert; appears mildly fatigued, but nontoxic; speaking in full sentences and tolerating own secretions; appears older than stated age; unkept appearance HEENT: NCAT; Ears: EACs clear, TMs pearly gray; Eyes: PERRL.  EOM grossly intact. Nose: nares patent without rhinorrhea, Throat: oropharynx  clear, tonsils non erythematous or enlarged, uvula midline  Neck: supple without LAD Lungs: unlabored respirations, symmetrical air entry; cough: absent; no respiratory distress; CTAB Heart: regular rate and rhythm.   Abdomen: soft, nondistended, normal active bowel sounds; nontender to palpation; no guarding  Neuro: ambulates with a walker; walking boots to bilateral LEs Skin: warm and dry Psychological: alert and cooperative; normal mood and affect  ASSESSMENT & PLAN:  1. Suspected COVID-19 virus infection   2. Flu-like symptoms  Meds ordered this encounter  Medications  . ondansetron (ZOFRAN) 4 MG tablet    Sig: Take 1 tablet (4 mg total) by mouth every 6 (six) hours.    Dispense:  12 tablet    Refill:  0    Order Specific Question:   Supervising Provider    Answer:   Raylene Everts [[id number]]   Declines COVID test at this time  In the meantime: You should remain isolated in your home for 10 days from symptom onset AND greater than 72 hours after symptoms resolution (absence of fever without the use of fever-reducing medication and improvement in respiratory symptoms), whichever is longer Get plenty of rest and push fluids Use OTC zyrtec for nasal congestion, runny nose, and/or sore throat Use OTC flonase for nasal congestion and runny nose Zofran prescribed.  Use as instructed for nausea and/or vomiting Use medications daily for symptom relief Use OTC medications like tylenol as needed fever or pain Follow up with PCP in 1-2 days via phone or e-visit for recheck and to ensure symptoms are improving Call or go to the ED if you have any new or worsening symptoms such as fever, cough, shortness of breath, chest tightness, chest pain, turning blue, changes in mental status, etc...   Reviewed expectations re: course of current medical issues. Questions answered. Outlined signs and symptoms indicating need for more acute intervention. Patient verbalized  understanding. After Visit Summary given.         Lestine Box, PA-C 05/16/19 1708

## 2019-05-16 NOTE — Discharge Instructions (Signed)
Declines COVID test at this time  In the meantime: You should remain isolated in your home for 10 days from symptom onset AND greater than 72 hours after symptoms resolution (absence of fever without the use of fever-reducing medication and improvement in respiratory symptoms), whichever is longer Get plenty of rest and push fluids Use OTC zyrtec for nasal congestion, runny nose, and/or sore throat Use OTC flonase for nasal congestion and runny nose Zofran prescribed.  Use as instructed for nausea and/or vomiting Use medications daily for symptom relief Use OTC medications like tylenol as needed fever or pain Follow up with PCP in 1-2 days via phone or e-visit for recheck and to ensure symptoms are improving Call or go to the ED if you have any new or worsening symptoms such as fever, cough, shortness of breath, chest tightness, chest pain, turning blue, changes in mental status, etc..[name]

## 2019-05-20 ENCOUNTER — Other Ambulatory Visit: Payer: Self-pay

## 2019-05-20 ENCOUNTER — Ambulatory Visit: Payer: Medicare Other | Attending: Internal Medicine

## 2019-05-20 DIAGNOSIS — Z20822 Contact with and (suspected) exposure to covid-19: Secondary | ICD-10-CM

## 2019-05-21 ENCOUNTER — Emergency Department (HOSPITAL_COMMUNITY): Payer: Medicare Other

## 2019-05-21 ENCOUNTER — Telehealth: Payer: Self-pay | Admitting: Family Medicine

## 2019-05-21 ENCOUNTER — Other Ambulatory Visit: Payer: Self-pay

## 2019-05-21 ENCOUNTER — Encounter (HOSPITAL_COMMUNITY): Payer: Self-pay | Admitting: Emergency Medicine

## 2019-05-21 ENCOUNTER — Emergency Department (HOSPITAL_COMMUNITY)
Admission: EM | Admit: 2019-05-21 | Discharge: 2019-05-22 | Disposition: A | Discharge status: Home / Self Care | Payer: Medicare Other | Attending: Emergency Medicine | Admitting: Emergency Medicine

## 2019-05-21 DIAGNOSIS — E119 Type 2 diabetes mellitus without complications: Secondary | ICD-10-CM | POA: Diagnosis not present

## 2019-05-21 DIAGNOSIS — Z794 Long term (current) use of insulin: Secondary | ICD-10-CM | POA: Insufficient documentation

## 2019-05-21 DIAGNOSIS — L03115 Cellulitis of right lower limb: Secondary | ICD-10-CM

## 2019-05-21 DIAGNOSIS — R0902 Hypoxemia: Secondary | ICD-10-CM | POA: Diagnosis not present

## 2019-05-21 DIAGNOSIS — R0602 Shortness of breath: Secondary | ICD-10-CM | POA: Diagnosis not present

## 2019-05-21 DIAGNOSIS — Z20822 Contact with and (suspected) exposure to covid-19: Secondary | ICD-10-CM | POA: Diagnosis not present

## 2019-05-21 DIAGNOSIS — R05 Cough: Secondary | ICD-10-CM | POA: Diagnosis not present

## 2019-05-21 DIAGNOSIS — R531 Weakness: Secondary | ICD-10-CM | POA: Diagnosis not present

## 2019-05-21 DIAGNOSIS — Z7901 Long term (current) use of anticoagulants: Secondary | ICD-10-CM | POA: Diagnosis not present

## 2019-05-21 DIAGNOSIS — R509 Fever, unspecified: Secondary | ICD-10-CM

## 2019-05-21 DIAGNOSIS — N390 Urinary tract infection, site not specified: Secondary | ICD-10-CM

## 2019-05-21 DIAGNOSIS — R069 Unspecified abnormalities of breathing: Secondary | ICD-10-CM | POA: Diagnosis not present

## 2019-05-21 DIAGNOSIS — Z79899 Other long term (current) drug therapy: Secondary | ICD-10-CM | POA: Diagnosis not present

## 2019-05-21 DIAGNOSIS — E11621 Type 2 diabetes mellitus with foot ulcer: Secondary | ICD-10-CM | POA: Diagnosis not present

## 2019-05-21 DIAGNOSIS — E785 Hyperlipidemia, unspecified: Secondary | ICD-10-CM | POA: Diagnosis not present

## 2019-05-21 DIAGNOSIS — R000 Tachycardia, unspecified: Secondary | ICD-10-CM | POA: Diagnosis not present

## 2019-05-21 DIAGNOSIS — Z6838 Body mass index (BMI) 38.0-38.9, adult: Secondary | ICD-10-CM | POA: Diagnosis not present

## 2019-05-21 DIAGNOSIS — Z7982 Long term (current) use of aspirin: Secondary | ICD-10-CM | POA: Diagnosis not present

## 2019-05-21 DIAGNOSIS — Z743 Need for continuous supervision: Secondary | ICD-10-CM | POA: Diagnosis not present

## 2019-05-21 DIAGNOSIS — G709 Myoneural disorder, unspecified: Secondary | ICD-10-CM | POA: Insufficient documentation

## 2019-05-21 DIAGNOSIS — I10 Essential (primary) hypertension: Secondary | ICD-10-CM | POA: Diagnosis not present

## 2019-05-21 DIAGNOSIS — Z87891 Personal history of nicotine dependence: Secondary | ICD-10-CM | POA: Insufficient documentation

## 2019-05-21 DIAGNOSIS — E871 Hypo-osmolality and hyponatremia: Secondary | ICD-10-CM | POA: Diagnosis not present

## 2019-05-21 DIAGNOSIS — F259 Schizoaffective disorder, unspecified: Secondary | ICD-10-CM | POA: Diagnosis not present

## 2019-05-21 LAB — CBC WITH DIFFERENTIAL/PLATELET
Abs Immature Granulocytes: 0.03 10*3/uL (ref 0.00–0.07)
Basophils Absolute: 0.1 10*3/uL (ref 0.0–0.1)
Basophils Relative: 1 %
Eosinophils Absolute: 0.1 10*3/uL (ref 0.0–0.5)
Eosinophils Relative: 1 %
HCT: 33.2 % — ABNORMAL LOW (ref 36.0–46.0)
Hemoglobin: 10.2 g/dL — ABNORMAL LOW (ref 12.0–15.0)
Immature Granulocytes: 0 %
Lymphocytes Relative: 14 %
Lymphs Abs: 1.1 10*3/uL (ref 0.7–4.0)
MCH: 24.3 pg — ABNORMAL LOW (ref 26.0–34.0)
MCHC: 30.7 g/dL (ref 30.0–36.0)
MCV: 79 fL — ABNORMAL LOW (ref 80.0–100.0)
Monocytes Absolute: 0.5 10*3/uL (ref 0.1–1.0)
Monocytes Relative: 7 %
Neutro Abs: 6.4 10*3/uL (ref 1.7–7.7)
Neutrophils Relative %: 77 %
Platelets: 364 10*3/uL (ref 150–400)
RBC: 4.2 MIL/uL (ref 3.87–5.11)
RDW: 16.4 % — ABNORMAL HIGH (ref 11.5–15.5)
WBC: 8.3 10*3/uL (ref 4.0–10.5)
nRBC: 0 % (ref 0.0–0.2)

## 2019-05-21 LAB — COMPREHENSIVE METABOLIC PANEL
ALT: 39 U/L (ref 0–44)
AST: 28 U/L (ref 15–41)
Albumin: 3.7 g/dL (ref 3.5–5.0)
Alkaline Phosphatase: 114 U/L (ref 38–126)
Anion gap: 12 (ref 5–15)
BUN: 16 mg/dL (ref 6–20)
CO2: 22 mmol/L (ref 22–32)
Calcium: 9.4 mg/dL (ref 8.9–10.3)
Chloride: 92 mmol/L — ABNORMAL LOW (ref 98–111)
Creatinine, Ser: 1.35 mg/dL — ABNORMAL HIGH (ref 0.44–1.00)
GFR calc Af Amer: 51 mL/min — ABNORMAL LOW (ref >60)
GFR calc non Af Amer: 44 mL/min — ABNORMAL LOW (ref >60)
Glucose, Bld: 133 mg/dL — ABNORMAL HIGH (ref 70–99)
Potassium: 4.9 mmol/L (ref 3.5–5.1)
Sodium: 126 mmol/L — ABNORMAL LOW (ref 135–145)
Total Bilirubin: 0.5 mg/dL (ref 0.3–1.2)
Total Protein: 7.6 g/dL (ref 6.5–8.1)

## 2019-05-21 LAB — C-REACTIVE PROTEIN: CRP: 0.7 mg/dL (ref <1.0)

## 2019-05-21 LAB — RESPIRATORY PANEL BY RT PCR (FLU A&B, COVID)
Influenza A by PCR: NEGATIVE
Influenza B by PCR: NEGATIVE
SARS Coronavirus 2 by RT PCR: NEGATIVE

## 2019-05-21 LAB — PROCALCITONIN: Procalcitonin: 0.65 ng/mL

## 2019-05-21 LAB — LACTATE DEHYDROGENASE: LDH: 134 U/L (ref 98–192)

## 2019-05-21 LAB — D-DIMER, QUANTITATIVE: D-Dimer, Quant: 1.13 ug/mL-FEU — ABNORMAL HIGH (ref 0.00–0.50)

## 2019-05-21 LAB — FIBRINOGEN: Fibrinogen: 620 mg/dL — ABNORMAL HIGH (ref 210–475)

## 2019-05-21 LAB — NOVEL CORONAVIRUS, NAA: SARS-CoV-2, NAA: NOT DETECTED

## 2019-05-21 LAB — FERRITIN: Ferritin: 46 ng/mL (ref 11–307)

## 2019-05-21 MED ORDER — CEPHALEXIN 500 MG PO CAPS: 500.0000 mg | ORAL_CAPSULE | Freq: Three times a day (TID) | ORAL | 0 refills | Status: DC

## 2019-05-21 MED ORDER — ACETAMINOPHEN 500 MG PO TABS
1000.0000 mg | ORAL_TABLET | Freq: Once | ORAL | Status: AC
  Administered 2019-05-21: 1000 mg via ORAL
  Filled 2019-05-21: qty 2

## 2019-05-21 MED ORDER — LACTATED RINGERS IV BOLUS
500.0000 mL | Freq: Once | INTRAVENOUS | Status: AC
  Administered 2019-05-21: 500 mL via INTRAVENOUS

## 2019-05-21 MED ORDER — IOHEXOL 350 MG/ML SOLN
100.0000 mL | Freq: Once | INTRAVENOUS | Status: AC | PRN
  Administered 2019-05-21: 75 mL via INTRAVENOUS

## 2019-05-21 MED ORDER — SODIUM CHLORIDE 0.9 % IV SOLN
1.0000 g | Freq: Once | INTRAVENOUS | Status: AC
  Administered 2019-05-21: 1 g via INTRAVENOUS
  Filled 2019-05-21: qty 10

## 2019-05-21 MED ORDER — ACETAMINOPHEN 325 MG PO TABS
650.0000 mg | ORAL_TABLET | Freq: Once | ORAL | Status: AC
  Administered 2019-05-21: 650 mg via ORAL
  Filled 2019-05-21: qty 2

## 2019-05-21 NOTE — Telephone Encounter (Signed)
Patient's daughter [name] was concerned about her pulse being 140 bpm but her B/P was 131/76. She was seen by urgent care recently on 01/14 for being feverish, nausea and headache and Covid test done which is not resulted yet. Patient still has nausea and headache and has no appetite. Please suggest ?

## 2019-05-21 NOTE — Telephone Encounter (Signed)
Pulse in 140s means she either has fever, pain/aches, or is mildly dehydrated.  BP is normal.  She should take increased water hydration fluids and also Tylenol as needed 500mg  to 1000mg  every 6-8 hours for fever / headache.  Follow-up with COVID results, and if not improving and concern dehydration or difficulty breathing, may need Urgent Care or ED again.  [name], [facility] 05/21/2019, 9:51 AM

## 2019-05-21 NOTE — Telephone Encounter (Signed)
Patient's daughter [name] notified.

## 2019-05-21 NOTE — ED Triage Notes (Signed)
PT brought in by [facility] for c/o generalized weakness, tachycardia and some SOB on exertion over the past week. On assessment pt noted to have 101.8 oral temperature and redness/swelling to right lower extremity. PT states she has reoccurring infections to her lower legs but denies any pain. PT c/o headache and nausea and mild SOB when exerting herself. CBG 123 in route to ED per EMS. PT states she had a covid test yesterday but it hasn't resulted yet.

## 2019-05-22 ENCOUNTER — Emergency Department (HOSPITAL_COMMUNITY)
Admission: EM | Admit: 2019-05-22 | Discharge: 2019-05-23 | Disposition: A | Discharge status: Home / Self Care | Payer: Medicare Other | Source: Home / Self Care | Attending: Emergency Medicine | Admitting: Emergency Medicine

## 2019-05-22 ENCOUNTER — Encounter: Payer: Self-pay | Admitting: Family Medicine

## 2019-05-22 ENCOUNTER — Ambulatory Visit: Payer: Self-pay | Admitting: General Practice

## 2019-05-22 ENCOUNTER — Other Ambulatory Visit: Payer: Self-pay

## 2019-05-22 ENCOUNTER — Telehealth: Payer: Self-pay | Admitting: Family Medicine

## 2019-05-22 ENCOUNTER — Ambulatory Visit (INDEPENDENT_AMBULATORY_CARE_PROVIDER_SITE_OTHER): Payer: Medicare Other | Admitting: Licensed Clinical Social Worker

## 2019-05-22 ENCOUNTER — Encounter (HOSPITAL_COMMUNITY): Payer: Self-pay

## 2019-05-22 DIAGNOSIS — F251 Schizoaffective disorder, depressive type: Secondary | ICD-10-CM

## 2019-05-22 DIAGNOSIS — IMO0002 Reserved for concepts with insufficient information to code with codable children: Secondary | ICD-10-CM

## 2019-05-22 DIAGNOSIS — E119 Type 2 diabetes mellitus without complications: Secondary | ICD-10-CM | POA: Insufficient documentation

## 2019-05-22 DIAGNOSIS — E1149 Type 2 diabetes mellitus with other diabetic neurological complication: Secondary | ICD-10-CM | POA: Diagnosis not present

## 2019-05-22 DIAGNOSIS — Z008 Encounter for other general examination: Secondary | ICD-10-CM | POA: Insufficient documentation

## 2019-05-22 DIAGNOSIS — E1169 Type 2 diabetes mellitus with other specified complication: Secondary | ICD-10-CM

## 2019-05-22 DIAGNOSIS — Z7689 Persons encountering health services in other specified circumstances: Secondary | ICD-10-CM

## 2019-05-22 DIAGNOSIS — E785 Hyperlipidemia, unspecified: Secondary | ICD-10-CM

## 2019-05-22 DIAGNOSIS — I10 Essential (primary) hypertension: Secondary | ICD-10-CM | POA: Insufficient documentation

## 2019-05-22 DIAGNOSIS — F209 Schizophrenia, unspecified: Secondary | ICD-10-CM | POA: Insufficient documentation

## 2019-05-22 DIAGNOSIS — Z87891 Personal history of nicotine dependence: Secondary | ICD-10-CM | POA: Insufficient documentation

## 2019-05-22 DIAGNOSIS — Z7984 Long term (current) use of oral hypoglycemic drugs: Secondary | ICD-10-CM | POA: Insufficient documentation

## 2019-05-22 DIAGNOSIS — R918 Other nonspecific abnormal finding of lung field: Secondary | ICD-10-CM

## 2019-05-22 DIAGNOSIS — L97522 Non-pressure chronic ulcer of other part of left foot with fat layer exposed: Secondary | ICD-10-CM

## 2019-05-22 DIAGNOSIS — E1165 Type 2 diabetes mellitus with hyperglycemia: Secondary | ICD-10-CM

## 2019-05-22 DIAGNOSIS — Z79899 Other long term (current) drug therapy: Secondary | ICD-10-CM | POA: Insufficient documentation

## 2019-05-22 DIAGNOSIS — R531 Weakness: Secondary | ICD-10-CM | POA: Insufficient documentation

## 2019-05-22 DIAGNOSIS — Z7982 Long term (current) use of aspirin: Secondary | ICD-10-CM | POA: Insufficient documentation

## 2019-05-22 LAB — COMPREHENSIVE METABOLIC PANEL
ALT: 34 U/L (ref 0–44)
AST: 25 U/L (ref 15–41)
Albumin: 3.2 g/dL — ABNORMAL LOW (ref 3.5–5.0)
Alkaline Phosphatase: 102 U/L (ref 38–126)
Anion gap: 13 (ref 5–15)
BUN: 14 mg/dL (ref 6–20)
CO2: 23 mmol/L (ref 22–32)
Calcium: 9.1 mg/dL (ref 8.9–10.3)
Chloride: 91 mmol/L — ABNORMAL LOW (ref 98–111)
Creatinine, Ser: 1.3 mg/dL — ABNORMAL HIGH (ref 0.44–1.00)
GFR calc Af Amer: 53 mL/min — ABNORMAL LOW (ref >60)
GFR calc non Af Amer: 46 mL/min — ABNORMAL LOW (ref >60)
Glucose, Bld: 224 mg/dL — ABNORMAL HIGH (ref 70–99)
Potassium: 4.3 mmol/L (ref 3.5–5.1)
Sodium: 127 mmol/L — ABNORMAL LOW (ref 135–145)
Total Bilirubin: 0.5 mg/dL (ref 0.3–1.2)
Total Protein: 6.6 g/dL (ref 6.5–8.1)

## 2019-05-22 LAB — CBC
HCT: 28.7 % — ABNORMAL LOW (ref 36.0–46.0)
Hemoglobin: 8.8 g/dL — ABNORMAL LOW (ref 12.0–15.0)
MCH: 24.5 pg — ABNORMAL LOW (ref 26.0–34.0)
MCHC: 30.7 g/dL (ref 30.0–36.0)
MCV: 79.9 fL — ABNORMAL LOW (ref 80.0–100.0)
Platelets: 337 10*3/uL (ref 150–400)
RBC: 3.59 MIL/uL — ABNORMAL LOW (ref 3.87–5.11)
RDW: 16.3 % — ABNORMAL HIGH (ref 11.5–15.5)
WBC: 7.6 10*3/uL (ref 4.0–10.5)
nRBC: 0 % (ref 0.0–0.2)

## 2019-05-22 LAB — URINALYSIS, ROUTINE W REFLEX MICROSCOPIC
Bacteria, UA: NONE SEEN
Bilirubin Urine: NEGATIVE
Glucose, UA: NEGATIVE mg/dL
Hgb urine dipstick: NEGATIVE
Ketones, ur: NEGATIVE mg/dL
Nitrite: NEGATIVE
Protein, ur: NEGATIVE mg/dL
Specific Gravity, Urine: 1.027 (ref 1.005–1.030)
pH: 5 (ref 5.0–8.0)

## 2019-05-22 LAB — ETHANOL: Alcohol, Ethyl (B): <10 mg/dL (ref <10)

## 2019-05-22 LAB — SALICYLATE LEVEL: Salicylate Lvl: <7 mg/dL — ABNORMAL LOW (ref 7.0–30.0)

## 2019-05-22 LAB — ACETAMINOPHEN LEVEL: Acetaminophen (Tylenol), Serum: <10 ug/mL — ABNORMAL LOW (ref 10–30)

## 2019-05-22 LAB — POC OCCULT BLOOD, ED: Fecal Occult Bld: NEGATIVE

## 2019-05-22 NOTE — Chronic Care Management (AMB) (Signed)
Chronic Care Management    Clinical Social Work Follow Up Note  05/22/2019 Name: [name] MRN: [id number] DOB: 1965-11-15  [name] is a 54 y.o. year old female who is a primary care patient of [name], DO. The CCM team was consulted for assistance with Level of Care Concerns and Mental Health Counseling and Resources.   Review of patient status, including review of consultants reports, other relevant assessments, and collaboration with appropriate care team members and the patient's provider was performed as part of comprehensive patient evaluation and provision of chronic care management services.    SDOH (Social Determinants of Health) screening performed today: Issues with patient's inability to make appropriate decisions for self. See Care Plan for related entries.   Advanced Directives Status: <no information> See Care Plan for related entries.   Outpatient Encounter Medications as of 05/22/2019  Medication Sig Note  . ARIPiprazole (ABILIFY) 10 MG tablet Take 10 mg by mouth daily.   [name] ascorbic acid (VITAMIN C) 500 MG tablet Take 500 mg by mouth daily.   [name] atorvastatin (LIPITOR) 40 MG tablet Take 1 tablet (40 mg total) by mouth daily.   [name] buPROPion (WELLBUTRIN XL) 150 MG 24 hr tablet Take 150 mg by mouth every morning.    . cephALEXin (KEFLEX) 500 MG capsule Take 1 capsule (500 mg total) by mouth 3 (three) times daily.   [name] ELIQUIS 2.5 MG TABS tablet Take 2.5 mg by mouth 2 (two) times daily. 05/21/2019: Has not yet started-Has filled medication-Awaiting a stress test at this time   . HUMALOG KWIKPEN 100 UNIT/ML KwikPen Inject 0.05 mLs (5 Units total) into the skin 3 (three) times daily with meals. + Sliding scale (Patient taking differently: Inject 12 Units into the skin 3 (three) times daily with meals. + Sliding scale)   . hydrochlorothiazide (HYDRODIURIL) 12.5 MG tablet Take 1 tablet (12.5 mg total) by mouth daily.   . Insulin Glargine (LANTUS  SOLOSTAR) 100 UNIT/ML Solostar Pen Inject 40 Units into the skin daily. Adjust dose as advised (Patient taking differently: Inject 60 Units into the skin daily. )   . Lancets (ONETOUCH ULTRASOFT) lancets Use to check blood sugar up to 3 x daily (Patient not taking: Reported on 05/21/2019)   . lisinopril (ZESTRIL) 20 MG tablet Take 1 tablet (20 mg total) by mouth daily.   . metFORMIN (GLUCOPHAGE) 1000 MG tablet Take 1 tablet (1,000 mg total) by mouth 2 (two) times daily with a meal.   . ondansetron (ZOFRAN) 4 MG tablet Take 1 tablet (4 mg total) by mouth every 6 (six) hours. (Patient taking differently: Take 4 mg by mouth every 8 (eight) hours as needed for nausea or vomiting. )   . ONETOUCH VERIO test strip Check blood sugar up to 5 x daily as needed (Patient not taking: Reported on 05/21/2019)    No facility-administered encounter medications on file as of 05/22/2019.     Goals Addressed    . SW- "I want to manage my health conditions and get better." (pt-stated)       Current Barriers:  . Financial constraints related to managing health care within the home . Limited social support . ADL IADL limitations . Social Isolation . Inability to perform ADL's independently . Inability to perform IADL's independently . INABILITY TO MAKE APPROPRIATE AND HEALTHY DECISIONS FOR SELF PER DAUGHTER . Patient is refusing care, baths, medications, follow up appointments and suggestions.   Clinical Social Work Clinical Goal(s):  [name] Over  the next 120 days, patient will work with SW to address concerns related to gaining additional support within the home or LTC placement and get connecting to local community resources/mental health support   Interventions: . Discussed plans with patient for ongoing care management follow up and provided patient with direct contact information for care management team . Provided education and assistance to client regarding Advanced Directives. . A voluntary and extensive  discussion about advanced care planning including explanation and discussion of advanced was undertaken with the patient and daughter.  Explanation regarding healthcare proxy and living will was reviewed and packet with forms with explanation of how to fill them out was given.   . Provided education to patient/caregiver regarding level of care options. . Provided education to patient/caregiver about Hospice and/or Palliative Care services . LCSW discussed coping skills for managing health care. SW used active and reflective listening, validated patient's daughters feelings/concerns, and provided emotional support. LCSW provided self-care education to caregiver help manage her mother's multiple health conditions and improve her overall mood. Daughter reports that patient is actively receiving mental health services at [facility] with [name].  . Daughter reports that she can no longer take care of patient in the home now that she is refusing care. Daughter agreeable to APS report. Patient went to ED yesterday. Patient's blood sugars and plus remain low.  [name] CCM LCSW and CCM RNCM collaborated with [name] daughter [name] today after receiving your message and she is agreeable to take patient to [facility] for psych evaluation today on 05/22/19. We called APS and filed a report but they are requesting a signed letter from the PCP stating her competency level. They almost did not want to take the case without that. [name] and I challenged that several times with APS caseworker. CCM team sent in basket message to Dr. Raliegh Ip asking if we could get a signed letter about our specific concerns in regards to her ability to make decisions for self, neglecting her physical and mental health care, refusing test at hospital on 05/21/2019, non-compliance issues with taking medications, taking baths, attending follow up appointments and not sharing information with family, etc. This letter can be emailed to DSS at  [email or web address]    Patient Self Care Activities:  . Attends all scheduled provider appointments . Calls provider office for new concerns or questions . Lacks social connections  Please see past updates related to this goal by clicking on the "Past Updates" button in the selected goal      Follow Up Plan: SW will follow up with patient by phone over the next week  [name], [place], MSW, [email or web address] Phone: [phone]

## 2019-05-22 NOTE — Social Work (Signed)
EDCSW met with Pt at bedside. Pt stated that she came to hospital for a phychiatric evaluation and that she was having difficulty maintaining personal hygiene. Pt  Also stated that she had been in another ED last night for a UTI but that she left without being tested, she was confused as to how this sequence of events occurred. Pt was pleasant and cooperative.

## 2019-05-22 NOTE — Patient Instructions (Addendum)
Visit Information  Goals Addressed            This Visit's Progress          RNCM: I want to get my diabetes back on track (pt-stated)  Current Barriers:  [name] Kitchen Knowledge Deficits related to basic Diabetes pathophysiology and self care/management: Does not use CBG meter, has had to be seen in ED for blood sugars >500 . Knowledge Deficits related to medications used for management of diabetes  Case Manager Clinical Goal(s):  Over the next 90 days, patient will demonstrate improved adherence to prescribed treatment plan for diabetes self care/management as evidenced by:  . daily monitoring and recording of CBG  . adherence to ADA/ carb modified diet . exercise 3 days/week . adherence to prescribed medication regimen : Daughter giving patient all of her daily medications, the daughter says the patient will not take her medication if she does not direct her to take . Decreased visits to the ED as evidence by patient being more independent and actively participating in her care: Patient recently in the ED related to cellulitis and other chronic disease processes  Interventions:  . Encouraged patient's daughter to take the patient to the ED to be evaluated due to the current stated condition of the patient  RNCM: Daughter [name]) I don't know how to help my mom  Current Barriers:  . Chronic Disease Managemant support, education, and care coordination needs related to HTN, DMII, and Schizoaffective disorder  Clinical Goal(s) related to HTN, DMII, and Schizo affective disorder:  Over the next 30 days, patient will: . Work with the care management team to address educational, disease management, and care coordination needs  . Begin or continue self health monitoring activities as directed today The patients daughter, [name] is concerned because the patient is not taking her medications as directed, not doing daily self care, and refusing test when being evaluated at the ED on  05-21-2019 . Call provider office for new or worsened signs and symptoms New or worsened symptom related to HTN, DM and Schizoaffective disorder . Call care management team with questions or concerns . Verbalize basic understanding of patient centered plan of care established today   Interventions related to HTN, DMII, and Schizo affective disorder:  . Evaluation of current treatment plans and patient's adherence to plan as established by provider  . Assessed patient understanding of disease states: The patients daughter, [name] wants to help the patient and does not know how to take care of the patients complex medical conditions . Assessed patient's education and care coordination needs: Working with the [place] and Pharmacist  . Provided disease specific education to patient/daughter on the need to be evaluated in the ED due to current concerns and condition of the patient . Collaborated with appropriate clinical care team members regarding patient needs . Report made to [facility]PS of [facility] related to concerns and safety of the patient 05-22-2019.  [name] with [facility]PS of [facility] has returned a call and states the report has been accepted and has been assigned to [name]  Patient Self Care Activities related to HTN, DMII, and schizo affective disorder:  . Patient is unable to independently self-manage chronic health conditions    [name] was given information about Chronic Care Management services today including:  1. CCM service includes personalized support from designated clinical staff supervised by her physician, including individualized plan of care and coordination with other care providers 2. 24/7 contact phone numbers for assistance for  urgent and routine care needs. 3. Service will only be billed when office clinical staff spend 20 minutes or more in a month to coordinate care. 4. Only one practitioner may furnish and bill the service in a calendar month. 5. The  patient may stop CCM services at any time (effective at the end of the month) by phone call to the office staff. 6. The patient will be responsible for cost sharing (co-pay) of up to 20% of the service fee (after annual deductible is met).  Patient agreed to services and verbal consent obtained.   The patient verbalized understanding of instructions provided today and declined a print copy of patient instruction materials.   The care management team will reach out to the patient again over the next 30 days.   Noreene Larsson RN, MSN, [place] Mobile: [phone]

## 2019-05-22 NOTE — Telephone Encounter (Signed)
FYI notified elizabeth and Dietrich Pates had already called her today.

## 2019-05-22 NOTE — ED Provider Notes (Signed)
[facility] EMERGENCY DEPARTMENT Provider Note   CSN: [id number] Arrival date & time: 05/21/19  1354     History Chief Complaint  Patient presents with  . Shortness of Breath    [name] is a 55 y.o. female.  HPI   54 year old female with generalized weakness.  Onset several days ago.  She reports that she was tested for Covid yesterday but is unaware of the results at this time.  Occasional cough.  Denies dyspnea.  Subjective fevers.  His complaints of pain and redness in her right leg.  She has a history of recurring cellulitis.   Past Medical History:  Diagnosis Date  . Depression   . Diabetes mellitus without complication ([place])   . Hyperlipidemia   . Hypertension   . Neuromuscular disorder ([place])   . Schizo affective schizophrenia ([place])    abilify and pazil    Patient Active Problem List   Diagnosis Date Noted  . Neuropathic foot ulcer, left, with fat layer exposed ([place]) 02/05/2019  . Hyperkalemia 02/05/2019  . Osteomyelitis ([place]) 12/11/2018  . Pyelonephritis 12/14/2017  . Morbid obesity ([place]) 05/10/2017  . Long-term use of high-risk medication 05/10/2017  . Hyperlipidemia associated with type 2 diabetes mellitus ([place]) 06/27/2016  . Hammer toe of second toe of right foot 04/20/2016  . Essential hypertension 06/29/2015  . Schizoaffective disorder ([place]) 06/29/2015  . DM (diabetes mellitus), type 2, 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[place]) 65/99/3570  . Polyneuropathy 04/02/2014    Past Surgical History:  Procedure Laterality Date  . CHOLECYSTECTOMY    . IRRIGATION AND DEBRIDEMENT FOOT Right 02/26/2016   Procedure: RIGHT 2ND TOE DEBRIDEMENT;  Surgeon: Samara Deist, DPM;  Location: [facility] ORS;  Service: Podiatry;  Laterality: Right;  . IRRIGATION AND DEBRIDEMENT FOOT Left 12/13/2018   Procedure: IRRIGATION AND DEBRIDEMENT FOOT;  Surgeon: Caroline More, DPM;  Location: [facility] ORS;  Service: Podiatry;  Laterality: Left;     OB History    Gravida  2   Para  2   Term  2   Preterm      AB      Living  2     SAB      TAB      Ectopic      Multiple      Live Births              Family History  Problem Relation Age of Onset  . Breast cancer Cousin        maternal cousin  . Hypertension Mother   . Hyperthyroidism Mother   . Diabetes Maternal Grandmother   . Hypertension Maternal Grandmother   . Cancer Maternal Grandmother        unknown type  . Diabetes Maternal Grandfather   . Hypertension Maternal Grandfather   . Diabetes Paternal Grandmother   . Hypertension Paternal Grandmother   . Diabetes Paternal Grandfather   . Hypertension Paternal Grandfather     Social History   Tobacco Use  . Smoking status: Former Smoker    Packs/day: 3.00    Years: 30.00    Pack years: 90.00    Types: Cigarettes    Quit date: 05/02/2009    Years since quitting: 10.0  . Smokeless tobacco: Former Systems developer  . Tobacco comment: Pt reported  quitting in 2011  Substance Use Topics  . Alcohol use: No  . Drug use: No    Home Medications Prior to Admission medications   Medication Sig Start Date End Date Taking? Authorizing  Provider  ARIPiprazole (ABILIFY) 10 MG tablet Take 10 mg by mouth daily.   Yes [provider]  ascorbic acid (VITAMIN C) 500 MG tablet Take 500 mg by mouth daily.   Yes [provider]  atorvastatin (LIPITOR) 40 MG tablet Take 1 tablet (40 mg total) by mouth daily. 02/20/19  Yes Karamalegos, Devonne Doughty, DO  buPROPion (WELLBUTRIN XL) 150 MG 24 hr tablet Take 150 mg by mouth every morning.    Yes [provider]  HUMALOG KWIKPEN 100 UNIT/ML KwikPen Inject 0.05 mLs (5 Units total) into the skin 3 (three) times daily with meals. + Sliding scale Patient taking differently: Inject 12 Units into the skin 3 (three) times daily with meals. + Sliding scale 03/23/18  Yes Karamalegos, Devonne Doughty, DO  hydrochlorothiazide (HYDRODIURIL) 12.5 MG tablet Take 1 tablet (12.5 mg total) by mouth daily.  04/29/19  Yes Karamalegos, Devonne Doughty, DO  Insulin Glargine (LANTUS SOLOSTAR) 100 UNIT/ML Solostar Pen Inject 40 Units into the skin daily. Adjust dose as advised Patient taking differently: Inject 60 Units into the skin daily.  12/17/18  Yes [name], MD  lisinopril (ZESTRIL) 20 MG tablet Take 1 tablet (20 mg total) by mouth daily. 04/17/19  Yes Karamalegos, Devonne Doughty, DO  metFORMIN (GLUCOPHAGE) 1000 MG tablet Take 1 tablet (1,000 mg total) by mouth 2 (two) times daily with a meal. 02/20/19  Yes Karamalegos, [name] J, DO  ondansetron (ZOFRAN) 4 MG tablet Take 1 tablet (4 mg total) by mouth every 6 (six) hours. Patient taking differently: Take 4 mg by mouth every 8 (eight) hours as needed for nausea or vomiting.  05/16/19  Yes Wurst, [place], PA-C  cephALEXin (KEFLEX) 500 MG capsule Take 1 capsule (500 mg total) by mouth 3 (three) times daily. 05/21/19   Virgel Manifold, MD  ELIQUIS 2.5 MG TABS tablet Take 2.5 mg by mouth 2 (two) times daily. 05/08/19   [provider]  Lancets Glory Rosebush ULTRASOFT) lancets Use to check blood sugar up to 3 x daily Patient not taking: Reported on 05/21/2019 03/18/19   [name], DO  [facility] VERIO test strip Check blood sugar up to 5 x daily as needed Patient not taking: Reported on 05/21/2019 04/17/19   [name], DO    Allergies    Patient has no known allergies.  Review of Systems   Review of Systems All systems reviewed and negative, other than as noted in HPI.  Physical Exam Updated Vital Signs BP 108/72   Pulse (!) 118   Temp 99 F (37.2 C) (Oral)   Resp 18   Ht 5\' 10"  (1.778 m)   Wt 120.2 kg   SpO2 100%   BMI 38.02 kg/m   Physical Exam Vitals and nursing note reviewed.  Constitutional:      General: She is not in acute distress.    Appearance: She is well-developed.  HENT:     Head: Normocephalic and atraumatic.  Eyes:     General:        Right eye: No discharge.        Left eye: No discharge.      Conjunctiva/sclera: Conjunctivae normal.  Cardiovascular:     Rate and Rhythm: Regular rhythm. Tachycardia present.     Heart sounds: Normal heart sounds. No murmur. No friction rub. No gallop.   Pulmonary:     Effort: Pulmonary effort is normal. No respiratory distress.     Breath sounds: Normal breath sounds.  Abdominal:  General: There is no distension.     Palpations: Abdomen is soft.     Tenderness: There is no abdominal tenderness.  Musculoskeletal:        General: No tenderness.     Cervical back: Neck supple.     Right lower leg: Edema present.     Left lower leg: Edema present.     Comments: Edema bilateral lower extremities.  Left lower extremity is in a walking boot.  Chronic appearing skin changes to bilateral feet and shins.  Faint erythema over the anterior distal aspect of the right shin with increased warmth.  Skin:    General: Skin is warm and dry.  Neurological:     Mental Status: She is alert.  Psychiatric:        Behavior: Behavior normal.        Thought Content: Thought content normal.     ED Results / Procedures / Treatments   Labs (all labs ordered are listed, but only abnormal results are displayed) Labs Reviewed  CBC WITH DIFFERENTIAL/PLATELET - Abnormal; Notable for the following components:      Result Value   Hemoglobin 10.2 (*)    HCT 33.2 (*)    MCV 79.0 (*)    MCH 24.3 (*)    RDW 16.4 (*)    All other components within normal limits  COMPREHENSIVE METABOLIC PANEL - Abnormal; Notable for the following components:   Sodium 126 (*)    Chloride 92 (*)    Glucose, Bld 133 (*)    Creatinine, Ser 1.35 (*)    GFR calc non Af Amer 44 (*)    GFR calc Af Amer 51 (*)    All other components within normal limits  D-DIMER, QUANTITATIVE (NOT AT [facility]) - Abnormal; Notable for the following components:   D-Dimer, Quant 1.13 (*)    All other components within normal limits  FIBRINOGEN - Abnormal; Notable for the following components:   Fibrinogen  620 (*)    All other components within normal limits  URINALYSIS, ROUTINE W REFLEX MICROSCOPIC - Abnormal; Notable for the following components:   APPearance HAZY (*)    Leukocytes,Ua MODERATE (*)    All other components within normal limits  RESPIRATORY PANEL BY RT PCR (FLU A&B, COVID)  URINE CULTURE  C-REACTIVE PROTEIN  FERRITIN  LACTATE DEHYDROGENASE  PROCALCITONIN    EKG EKG Interpretation  Date/Time:  Tuesday May 21 2019 14:41:10 EST Ventricular Rate:  136 PR Interval:    QRS Duration: 88 QT Interval:  277 QTC Calculation: 417 R Axis:   94 Text Interpretation: Sinus tachycardia Borderline right axis deviation Borderline T abnormalities, inferior leads No significant change since last tracing Confirmed by Virgel Manifold [phone]) on 05/21/2019 3:51:11 PM   Radiology CT Angio Chest PE W and/or Wo Contrast  Result Date: 05/21/2019 CLINICAL DATA:  Shortness of breath. Positive D-dimer. Weakness. Tachycardia. Headache. COVID-19 negative earlier today. EXAM: CT ANGIOGRAPHY CHEST WITH CONTRAST TECHNIQUE: Multidetector CT imaging of the chest was performed using the standard protocol during bolus administration of intravenous contrast. Multiplanar CT image reconstructions and MIPs were obtained to evaluate the vascular anatomy. CONTRAST:  55mL OMNIPAQUE IOHEXOL 350 MG/ML SOLN COMPARISON:  Chest radiograph earlier today.  CTA chest 04/12/2019 FINDINGS: Cardiovascular: The quality of this exam for evaluation of pulmonary embolism is moderate. The bolus is centered in the SVC. No pulmonary embolism to the large segmental level. Aortic atherosclerosis. No dissection. Mild cardiomegaly, without pericardial effusion. Proximal LAD and possible left main  coronary artery atherosclerosis, including on 47/4. Mediastinum/Nodes: No mediastinal or hilar adenopathy. Lungs/Pleura: No pleural fluid.  Clear lungs. Upper Abdomen: Cholecystectomy. Normal imaged portions of the liver, spleen, stomach,  pancreas, kidneys, right adrenal gland. Left adrenal nodule of 1.4 cm, present back to 08/19/2012 abdominal CT. Musculoskeletal: Midthoracic spondylosis. Mild right hemidiaphragm elevation. Review of the MIP images confirms the above findings. IMPRESSION: 1. Moderate quality evaluation for pulmonary embolism. No pulmonary embolism to the large segmental level. 2. Age advanced coronary artery atherosclerosis. Recommend assessment of coronary risk factors and consideration of medical therapy. 3. Left adrenal nodule, similar to 08/19/2012 and therefore presumably an adenoma. Electronically Signed   By: Abigail Miyamoto M.D.   On: 05/21/2019 20:09   DG Chest Portable 1 View  Result Date: 05/21/2019 CLINICAL DATA:  Cough and fever EXAM: PORTABLE CHEST 1 VIEW COMPARISON:  May 21, 2019 FINDINGS: There is mild elevation of the right hemidiaphragm. Lungs are clear. Heart size and pulmonary vascularity are normal. No adenopathy. No bone lesions. IMPRESSION: Mild elevation of right hemidiaphragm. No edema or consolidation. Cardiac silhouette normal. No adenopathy. Electronically Signed   By: Lowella Grip III M.D.   On: 05/21/2019 16:10    Procedures Procedures (including critical care time)  Medications Ordered in ED Medications  acetaminophen (TYLENOL) tablet 1,000 mg (1,000 mg Oral Given 05/21/19 1620)  lactated ringers bolus 500 mL (0 mLs Intravenous Stopped 05/21/19 1820)  iohexol (OMNIPAQUE) 350 MG/ML injection 100 mL (75 mLs Intravenous Contrast Given 05/21/19 1922)  cefTRIAXone (ROCEPHIN) 1 g in sodium chloride 0.9 % 100 mL IVPB (0 g Intravenous Stopped 05/22/19 0023)  acetaminophen (TYLENOL) tablet 650 mg (650 mg Oral Given 05/21/19 2333)    ED Course  I have reviewed the triage vital signs and the nursing notes.  Pertinent labs & imaging results that were available during my care of the patient were reviewed by me and considered in my medical decision making (see chart for details).    MDM  Rules/Calculators/A&P    53yF with generalized weakness. Likely from febrile illness. Possibly from UTI. Questionable cellulitis of RLE. I wouldn't expect significant systemic symptoms from this though. Still mildly tachycardic improved after tylenol and some fluid. Hyponatremia noted but appears that chronic to some degree. She is on wellbtrin and hctz. Rocephin in ED. Keflex and outpt FU.   Final Clinical Impression(s) / ED Diagnoses Final diagnoses:  Cellulitis of right lower extremity  Febrile illness    Rx / DC Orders ED Discharge Orders         Ordered    cephALEXin (KEFLEX) 500 MG capsule  3 times daily     05/21/19 2323           Virgel Manifold, MD 05/22/19 1818

## 2019-05-22 NOTE — ED Notes (Signed)
Sister called back asking for EDP to call her because daughter is too upset about the situation at this point; EDP was given sitter # to call; Actuary again advise she will filing  Formal compliant the AM; Sister asked for EDP full name; only first name was given for privacy of EDP but sister was advised that once chart is pulled the EDP will be identified; Charge RN has reported to unit as well as Tourist information centre manager and is fully aware of situation; RN waiting for more information from EDP at this time on next steps. Social worker has been in patient room to speak with patient-[name],RN

## 2019-05-22 NOTE — Care Management (Signed)
ED [facility] team consulted concerning [facility] services patient would benefit from transitional care services with [facility] for self-management of chronic conditions. CM disussed the recommendations for [facility] services patient is agreeable, updated patient that referral would be sent out to several [facility] agencies due to the limited availability of [place] services due to the pandemic. Patient is also active with [facility] updated [facility]  CSW  [name] to follow up with patient.

## 2019-05-22 NOTE — ED Notes (Signed)
ED Provider at bedside. 

## 2019-05-22 NOTE — ED Notes (Signed)
Sister of patient called with concerns of why patient is being d/c home; RN gave the same info given to daughter and advised I don't have authorization at this time to speak with her; family was asking RN to put her hold til someone can speak with, RN advised family that I will pass her information to Charge and EDP for a call back this time as RN has other patient's and will not allow her to be placed on hold for unknown amount of time; Family was not  Understanding and states the call is being recorded or monitored by some other unknown party to RN; Family went on to say that patient [place] and wanted to confirm we had records and have gone through records; RN advised sister that is a EDP question and I could not answer that; Sister states "I'm asking you, the question"; RN continue to advise family that I would take her number and pass to appropriate people but family was not happy with answers; Sister states she will be filing a complaint regarding care and phone call; RN assured family that is perfectly fine and within her rights to do so; RN advised charge RN and EDP of extensive conversation with sister; EDP has advised she has spoke back with daughter and advised of decision again.
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
## 2019-05-22 NOTE — ED Provider Notes (Addendum)
[facility] EMERGENCY DEPARTMENT Provider Note   CSN: [id number] Arrival date & time: 05/22/19  1513     History Chief Complaint  Patient presents with  . Psychiatric Evaluation    [name] is a 54 y.o. female.  HPI   54 year old female with history of depression, diabetes, hyperlipidemia, hypertension, neuromuscular disorder, schizoaffective disorder, who presents the emergency department today for psychiatric evaluation.   Patient states that her family feels she is not taking care of herself the way that she normally does.  Family contacted patient's PCP who charted that case mgmt/social work are involved with pt. They requested that she come to the ED for psychiatric evaluation. Her primary has contacted APS as well.  Pt states that it has been somewhat more difficult for her to complete her ADLs at home because she is wearing a boot on her left lower extremity so it has been harder for her to bathe, etc.  She reports that she has been taking her medications.  She currently lives with her daughter and has been for the last 2 months.   She has history of schizoaffective disorder and states that she has not really been hearing voices lately but she has been having some thoughts that make her uncomfortable.  She states that she sometimes has thoughts that she may have harmed someone and this worries her, but she knows that she has not harmed anyone.  She becomes tearful when talking about this.  She denies that she is having any command hallucinations or that she has any intention of harming herself or others.  She has been having the symptoms for about 2 weeks.  Of note, patient was seen in the ED yesterday for evaluation of generalized weakness.  She was found to have a possible cellulitis and UTI.  She was given Rocephin in the ED and was discharged on Keflex.  Past Medical History:  Diagnosis Date  . Depression   . Diabetes mellitus without complication  ([place])   . Hyperlipidemia   . Hypertension   . Neuromuscular disorder ([place])   . Schizo affective schizophrenia ([place])    abilify and pazil    Patient Active Problem List   Diagnosis Date Noted  . Neuropathic foot ulcer, left, with fat layer exposed ([place]) 02/05/2019  . Hyperkalemia 02/05/2019  . Osteomyelitis ([place]) 12/11/2018  . Pyelonephritis 12/14/2017  . Morbid obesity ([place]) 05/10/2017  . Long-term use of high-risk medication 05/10/2017  . Hyperlipidemia associated with type 2 diabetes mellitus ([place]) 06/27/2016  . Hammer toe of second toe of right foot 04/20/2016  . Essential hypertension 06/29/2015  . Schizoaffective disorder ([place]) 06/29/2015  . DM (diabetes mellitus), type 2, 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[place]) 76/72/0947  . Polyneuropathy 04/02/2014    Past Surgical History:  Procedure Laterality Date  . CHOLECYSTECTOMY    . IRRIGATION AND DEBRIDEMENT FOOT Right 02/26/2016   Procedure: RIGHT 2ND TOE DEBRIDEMENT;  Surgeon: Samara Deist, DPM;  Location: [facility] ORS;  Service: Podiatry;  Laterality: Right;  . IRRIGATION AND DEBRIDEMENT FOOT Left 12/13/2018   Procedure: IRRIGATION AND DEBRIDEMENT FOOT;  Surgeon: Caroline More, DPM;  Location: [facility] ORS;  Service: Podiatry;  Laterality: Left;     OB History    Gravida  2   Para  2   Term  2   Preterm      AB      Living  2     SAB      TAB  Ectopic      Multiple      Live Births              Family History  Problem Relation Age of Onset  . Breast cancer Cousin        maternal cousin  . Hypertension Mother   . Hyperthyroidism Mother   . Diabetes Maternal Grandmother   . Hypertension Maternal Grandmother   . Cancer Maternal Grandmother        unknown type  . Diabetes Maternal Grandfather   . Hypertension Maternal Grandfather   . Diabetes Paternal Grandmother   . Hypertension Paternal Grandmother   . Diabetes Paternal Grandfather   . Hypertension Paternal Grandfather     Social  History   Tobacco Use  . Smoking status: Former Smoker    Packs/day: 3.00    Years: 30.00    Pack years: 90.00    Types: Cigarettes    Quit date: 05/02/2009    Years since quitting: 10.0  . Smokeless tobacco: Former Systems developer  . Tobacco comment: Pt reported  quitting in 2011  Substance Use Topics  . Alcohol use: No  . Drug use: No    Home Medications Prior to Admission medications   Medication Sig Start Date End Date Taking? Authorizing Provider  ARIPiprazole (ABILIFY) 10 MG tablet Take 10 mg by mouth at bedtime.    Yes [provider]  ascorbic acid (VITAMIN C) 500 MG tablet Take 500 mg by mouth daily.   Yes [provider]  aspirin EC 81 MG tablet Take 81 mg by mouth 3 (three) times a week.   Yes [provider]  atorvastatin (LIPITOR) 40 MG tablet Take 1 tablet (40 mg total) by mouth daily. 02/20/19  Yes Karamalegos, Devonne Doughty, DO  buPROPion (WELLBUTRIN XL) 150 MG 24 hr tablet Take 150 mg by mouth every morning.    Yes [provider]  HUMALOG KWIKPEN 100 UNIT/ML KwikPen Inject 0.05 mLs (5 Units total) into the skin 3 (three) times daily with meals. + Sliding scale Patient taking differently: Inject 12 Units into the skin See admin instructions. Inject 12 units into the skin 3 times with meals, plus 2 units for every 50 points above 150 03/23/18  Yes Karamalegos, Devonne Doughty, DO  hydrochlorothiazide (HYDRODIURIL) 12.5 MG tablet Take 1 tablet (12.5 mg total) by mouth daily. 04/29/19  Yes Karamalegos, Devonne Doughty, DO  Insulin Glargine (LANTUS SOLOSTAR) 100 UNIT/ML Solostar Pen Inject 40 Units into the skin daily. Adjust dose as advised Patient taking differently: Inject 60 Units into the skin daily before breakfast.  12/17/18  Yes [name], MD  lisinopril (ZESTRIL) 20 MG tablet Take 1 tablet (20 mg total) by mouth daily. 04/17/19  Yes Karamalegos, Devonne Doughty, DO  metFORMIN (GLUCOPHAGE) 1000 MG tablet Take 1 tablet (1,000 mg total) by mouth 2 (two)  times daily with a meal. 02/20/19  Yes Karamalegos, [name] J, DO  ondansetron (ZOFRAN) 4 MG tablet Take 1 tablet (4 mg total) by mouth every 6 (six) hours. Patient taking differently: Take 4 mg by mouth every 8 (eight) hours as needed for nausea or vomiting.  05/16/19  Yes Wurst, [place], PA-C  cephALEXin (KEFLEX) 500 MG capsule Take 1 capsule (500 mg total) by mouth 3 (three) times daily. Patient not taking: Reported on 05/22/2019 05/21/19   Virgel Manifold, MD  ELIQUIS 2.5 MG TABS tablet Take 2.5 mg by mouth 2 (two) times daily. 05/08/19   [provider]  Lancets Glory Rosebush ULTRASOFT)  lancets Use to check blood sugar up to 3 x daily 03/18/19   Parks Ranger, Devonne Doughty, DO  [facility] VERIO test strip Check blood sugar up to 5 x daily as needed 04/17/19   [name], DO    Allergies    Patient has no known allergies.  Review of Systems   Review of Systems  Constitutional: Negative for chills and fever.  HENT: Negative for ear pain and sore throat.   Eyes: Negative for visual disturbance.  Respiratory: Negative for cough and shortness of breath.   Cardiovascular: Negative for chest pain.  Gastrointestinal: Negative for abdominal pain, constipation, diarrhea, nausea and vomiting.  Genitourinary: Negative for dysuria and hematuria.  Musculoskeletal:       LLE pain  Skin: Negative for color change and rash.  Neurological: Negative for headaches.  Psychiatric/Behavioral: Negative for hallucinations and suicidal ideas.  All other systems reviewed and are negative.   Physical Exam Updated Vital Signs BP (!) 157/78 (BP Location: Left Arm)   Pulse (!) 109   Temp 97.9 F (36.6 C) (Oral)   Resp 18   SpO2 98%   Physical Exam Vitals and nursing note reviewed.  Constitutional:      General: She is not in acute distress.    Appearance: She is well-developed.  HENT:     Head: Normocephalic and atraumatic.  Eyes:     Conjunctiva/sclera: Conjunctivae normal.    Cardiovascular:     Rate and Rhythm: Regular rhythm. Tachycardia present.  Pulmonary:     Effort: Pulmonary effort is normal.  Abdominal:     General: Abdomen is flat.  Musculoskeletal:     Cervical back: Neck supple.     Right lower leg: Edema present.     Comments: Post op boot noted to lle  Skin:    General: Skin is warm and dry.  Neurological:     Mental Status: She is alert.  Psychiatric:        Attention and Perception: Attention normal.        Mood and Affect: Mood normal.        Speech: Speech normal.        Behavior: Behavior normal. Behavior is cooperative.        Thought Content: Thought content does not include homicidal or suicidal ideation. Thought content does not include homicidal or suicidal plan.     Comments: tearful     ED Results / Procedures / Treatments   Labs (all labs ordered are listed, but only abnormal results are displayed) Labs Reviewed  COMPREHENSIVE METABOLIC PANEL - Abnormal; Notable for the following components:      Result Value   Sodium 127 (*)    Chloride 91 (*)    Glucose, Bld 224 (*)    Creatinine, Ser 1.30 (*)    Albumin 3.2 (*)    GFR calc non Af Amer 46 (*)    GFR calc Af Amer 53 (*)    All other components within normal limits  SALICYLATE LEVEL - Abnormal; Notable for the following components:   Salicylate Lvl <7.0 (*)    All other components within normal limits  ACETAMINOPHEN LEVEL - Abnormal; Notable for the following components:   Acetaminophen (Tylenol), Serum <10 (*)    All other components within normal limits  CBC - Abnormal; Notable for the following components:   RBC 3.59 (*)    Hemoglobin 8.8 (*)    HCT 28.7 (*)    MCV 79.9 (*)  MCH 24.5 (*)    RDW 16.3 (*)    All other components within normal limits  ETHANOL  POC OCCULT BLOOD, ED    EKG None  Radiology CT Angio Chest PE W and/or Wo Contrast  Result Date: 05/21/2019 CLINICAL DATA:  Shortness of breath. Positive D-dimer. Weakness. Tachycardia.  Headache. COVID-19 negative earlier today. EXAM: CT ANGIOGRAPHY CHEST WITH CONTRAST TECHNIQUE: Multidetector CT imaging of the chest was performed using the standard protocol during bolus administration of intravenous contrast. Multiplanar CT image reconstructions and MIPs were obtained to evaluate the vascular anatomy. CONTRAST:  44mL OMNIPAQUE IOHEXOL 350 MG/ML SOLN COMPARISON:  Chest radiograph earlier today.  CTA chest 04/12/2019 FINDINGS: Cardiovascular: The quality of this exam for evaluation of pulmonary embolism is moderate. The bolus is centered in the SVC. No pulmonary embolism to the large segmental level. Aortic atherosclerosis. No dissection. Mild cardiomegaly, without pericardial effusion. Proximal LAD and possible left main coronary artery atherosclerosis, including on 47/4. Mediastinum/Nodes: No mediastinal or hilar adenopathy. Lungs/Pleura: No pleural fluid.  Clear lungs. Upper Abdomen: Cholecystectomy. Normal imaged portions of the liver, spleen, stomach, pancreas, kidneys, right adrenal gland. Left adrenal nodule of 1.4 cm, present back to 08/19/2012 abdominal CT. Musculoskeletal: Midthoracic spondylosis. Mild right hemidiaphragm elevation. Review of the MIP images confirms the above findings. IMPRESSION: 1. Moderate quality evaluation for pulmonary embolism. No pulmonary embolism to the large segmental level. 2. Age advanced coronary artery atherosclerosis. Recommend assessment of coronary risk factors and consideration of medical therapy. 3. Left adrenal nodule, similar to 08/19/2012 and therefore presumably an adenoma. Electronically Signed   By: Abigail Miyamoto M.D.   On: 05/21/2019 20:09   DG Chest Portable 1 View  Result Date: 05/21/2019 CLINICAL DATA:  Cough and fever EXAM: PORTABLE CHEST 1 VIEW COMPARISON:  May 21, 2019 FINDINGS: There is mild elevation of the right hemidiaphragm. Lungs are clear. Heart size and pulmonary vascularity are normal. No adenopathy. No bone lesions.  IMPRESSION: Mild elevation of right hemidiaphragm. No edema or consolidation. Cardiac silhouette normal. No adenopathy. Electronically Signed   By: Lowella Grip III M.D.   On: 05/21/2019 16:10    Procedures Procedures (including critical care time)  Medications Ordered in ED Medications - No data to display  ED Course  I have reviewed the triage vital signs and the nursing notes.  Pertinent labs & imaging results that were available during my care of the patient were reviewed by me and considered in my medical decision making (see chart for details).    MDM Rules/Calculators/A&P                     54 year old female presenting for psychiatric assessment.  Family and PCP are concerned that patient is not taking care of herself the way that she normally should.  Patient denies any overt hallucinations but states that she has been having some thoughts which are upsetting to her.  She becomes tearful when talking about this and is somewhat vague in her description of her thoughts. She denies having any command hallucinations or thoughts of wanting to harm others. She denies SI, HI.  Vital signs show some tachycardia which on chart review appears chronic in nature.  She is somewhat hypertensive as well but her vital signs are otherwise reassuring.  Reviewed labs CBC with anemia that looks worse than yesterday, no leukocytosis  - pt denies bloody stools or melena, I suspect that this is likely more chronic in nature and it appears lower today because  of hemodilution from fluids she received yesterday in the ED  - hemoccult negative, pt aware she needs to f/u with pcp CMP with hyponatremia, elevated BG but normal CO2 and anion gap, Cr stable  - hyponatremia seems chronic, likely from her wellbutrin and hctz Etoh, salicylate, acetaminophen levels negative UA suggestive of UTI, culture sent yesterday. Keflex rx given yesterday.   Consult to TTS who evaluated the patient and felt she did  not meet inpatient criteria.   Social work consulted and recommends home health. Face to face ordered for home health evaluation.   Patient stated that we can contact her family (daughter and sister) for updates.   Updated patient's daughter about plan for discharge with home health evaluation.  She is voicing concerns that the patient is refusing to bathe and lays on the couch all day.  I reiterated that the patient does not have an acute medical illness or psychiatric emergency today that would require she be admitted however due to their concerns we have taken steps to help the patient improve her quality of life by involving case management/social work to obtain home health potentially transition to a long term care facility.  Patient sister requested update on plan. I attempted to update her.  She was concerned about the patient being discharged.  I attempted to explain the work-up and plan multiple times but she continued to be upset that the patient was being discharged. I reiterated multiple times that pt did not meet criteria for either medical or psychiatric admission at this time.   I attempted assured her that her family members health is important to [place] and we would like to help set up home health and help transition her to a long-term care facility if that is what is felt is indicated. Family member stated she was recording me and made several threats to sue the hospital and make a formal complaint. She also spoke with my attending physician Dr. Wilson Singer.  Discussed plan with the patient who acknowledges this and is in agreement for discharge home.  Advised PCP follow-up and return precautions.  She voices understand the plan and reasons to return.  All questions answered patient stable for discharge.  Case was discussed with Dr. Wilson Singer who is in agreement with the plan for discharge.   Final Clinical Impression(s) / ED Diagnoses Final diagnoses:  Encounter for psychiatric assessment     Rx / DC Orders ED Discharge Orders         [place]     05/22/19 2012    Face-to-face encounter (required for Medicare/Medicaid patients)    Comments: [place] certify that this patient is under my care and that I, or a nurse practitioner or physician's assistant working with me, had a face-to-face encounter that meets the physician face-to-face encounter requirements with this patient on 05/22/2019. The encounter with the patient was in whole, or in part for the following medical condition(s) which is the primary reason for home health care (List medical condition): Pt currently has LLE fracture that is impeding her ability to complete ADLs. She will also need help with medication management.   05/22/19 2012           Audiel Scheiber S, PA-C 05/22/19 2149    Rodney Booze, PA-C 05/22/19 2345    Virgel Manifold, MD 05/23/19 1505

## 2019-05-22 NOTE — Telephone Encounter (Signed)
As per Daughter [name], patient went to ED and diagnosed with Cellulitis of right lower extremity and Febrile illness since her pulse was 160 bpm and pulse ox was 89. [name] has choric medical Hx and mental health and also Schizoaffective disorder and in past she used to live with her mother who has dementia and now she is living with her daughter. She refused to take bath and had not taken one from 03/2019. It is hard for her daughter to convince her and she is unable to perform some ADL. [name] had tried contact Psychiatrists but they can't share any information since she is not listed on DPR. Her question is that can you refer her to facility where she can be in patient and some one can take care for her daily activities. Please suggest ?

## 2019-05-22 NOTE — ED Notes (Signed)
PA has advised that Dr.Kohut is now taking over conversation with sister; If decision stands for d/c RN will contact daughter back to arrange for pick up or arrangements can be made for [facility] to transfer patient home and [facility] to take patient wheelchair per Nurse case manager; Patient has been sitting quietly in room with no complaints at this time; Patient dose not appear to be responding to any internal stimuli at this time-[name],RN

## 2019-05-22 NOTE — Telephone Encounter (Signed)
[name] has had a difficult time managing her health at home over past few years, she has had non adherence issues and lost to follow-up in the past at times, she has had uncontrolled diabetes and complications as a result.  It sounds like she is not able to manage her care at home with current level of assistance from daughter, since no longer living with her mother.  She is already established with our CCM Services, including Case Management, Social Work Scientist, forensic.  Perhaps next step is to look into options for increased level of care at a [facility] facility if it is indicated.  Forwarding this note to CCM team - specifically Jerene Pitch if you can look into what she would be eligible for and how to proceed.  She has had multiple hospitalizations, ED visits, and in past multiple rounds of Home Health before. At this point I am not confident she can remain at home.  I am not sure if APS needs to get involved?  [name], [facility] 05/22/2019, 10:46 AM

## 2019-05-22 NOTE — ED Triage Notes (Signed)
During triage pt reports she needs a psychiatric evaluation. Denies SI/HI at this time. When asked about [place] she states "I am having an episode, I have heard things recently, I have schizophrenia. "

## 2019-05-22 NOTE — Discharge Instructions (Signed)
You were evaluated by the psychiatric team who felt that you did not need to be admitted for psychiatric emergency at this time.  Our social worker/case management team was consulted during your visit to the emergency department and we have placed an order for a face-to-face encounter to evaluate you for home health services.  They will be contacting you within the next few days to help set up an appointment to evaluate your needs.  They can also be of service in terms of helping you transition to a long-term care facility.  Please make an appoint with your regular doctor in the next 3 to 5 days for evaluation and return to the emergency department for new or worsening symptoms in the meantime.

## 2019-05-22 NOTE — [facility] Counselor (Signed)
Clinician to call TTS cart in 10 minutes to complete the pt's assessment.   Vertell Novak, [place], [facility], [facility] Triage Specialist [phone]

## 2019-05-22 NOTE — ED Notes (Signed)
Patient changed into maroon scrubs and belongings taken from patient as she entered purple zone; Patient wheelchair remains at beside with patient; Pt is unawre of why she is in ED at this time; RN waiting for dispo from TTS just completed-[name],RN

## 2019-05-22 NOTE — Telephone Encounter (Signed)
See note from [employer] LCSW today in [place].  Spoke with her as well via Diplomatic Services operational officer and letter was written for patient with goal to escalate her level of care, posted to her mychart letters, printed signed and was faxed as requested to [place] to [place].  [name], [facility] 05/22/2019, 3:05 PM

## 2019-05-22 NOTE — Chronic Care Management (AMB) (Signed)
Chronic Care Management   Initial Visit Note  05/22/2019 Name: [name] MRN: [id number] DOB: October 07, 1965  Referred by: Gabriela Hauser, DO Reason for referral : Chronic Care Management (Message received from MD- immediate needs and concens)   [name] is a 54 y.o. year old female who is a primary care patient of Gabriela Hauser, DO. The CCM team was consulted for assistance with chronic disease management and care coordination needs related to HTN, DMII and Schizo affective disorder  Review of patient status, including review of consultants reports, relevant laboratory and other test results, and collaboration with appropriate care team members and the patient's provider was performed as part of comprehensive patient evaluation and provision of chronic care management services.    SDOH (Social Determinants of Health) screening performed today: Depression   Stress. See Care Plan for related entries.   Medications: Outpatient Encounter Medications as of 05/22/2019  Medication Sig Note  . ARIPiprazole (ABILIFY) 10 MG tablet Take 10 mg by mouth daily.   [name] ascorbic acid (VITAMIN C) 500 MG tablet Take 500 mg by mouth daily.   [name] atorvastatin (LIPITOR) 40 MG tablet Take 1 tablet (40 mg total) by mouth daily.   [name] buPROPion (WELLBUTRIN XL) 150 MG 24 hr tablet Take 150 mg by mouth every morning.    . cephALEXin (KEFLEX) 500 MG capsule Take 1 capsule (500 mg total) by mouth 3 (three) times daily.   [name] ELIQUIS 2.5 MG TABS tablet Take 2.5 mg by mouth 2 (two) times daily. 05/21/2019: Has not yet started-Has filled medication-Awaiting a stress test at this time   . HUMALOG KWIKPEN 100 UNIT/ML KwikPen Inject 0.05 mLs (5 Units total) into the skin 3 (three) times daily with meals. + Sliding scale (Patient taking differently: Inject 12 Units into the skin 3 (three) times daily with meals. + Sliding scale)   . hydrochlorothiazide (HYDRODIURIL) 12.5 MG tablet Take 1 tablet  (12.5 mg total) by mouth daily.   . Insulin Glargine (LANTUS SOLOSTAR) 100 UNIT/ML Solostar Pen Inject 40 Units into the skin daily. Adjust dose as advised (Patient taking differently: Inject 60 Units into the skin daily. )   . Lancets (ONETOUCH ULTRASOFT) lancets Use to check blood sugar up to 3 x daily (Patient not taking: Reported on 05/21/2019)   . lisinopril (ZESTRIL) 20 MG tablet Take 1 tablet (20 mg total) by mouth daily.   . metFORMIN (GLUCOPHAGE) 1000 MG tablet Take 1 tablet (1,000 mg total) by mouth 2 (two) times daily with a meal.   . ondansetron (ZOFRAN) 4 MG tablet Take 1 tablet (4 mg total) by mouth every 6 (six) hours. (Patient taking differently: Take 4 mg by mouth every 8 (eight) hours as needed for nausea or vomiting. )   . ONETOUCH VERIO test strip Check blood sugar up to 5 x daily as needed (Patient not taking: Reported on 05/21/2019)    No facility-administered encounter medications on file as of 05/22/2019.    OBJECTIVE:   BP Readings from Last 3 Encounters:  05/21/19 108/72  05/16/19 (!) 146/75  04/27/19 120/62   Lab Results  Component Value Date   HGBA1C 10.9 (H) 12/14/2018   GOALS Addressed:  RNCM: I want to get my diabetes back on track (pt-stated)  Current Barriers:  [name] Knowledge Deficits related to basic Diabetes pathophysiology and self care/management: Does not use CBG meter, has had to be seen in ED for blood sugars >500 . Knowledge Deficits related to medications used for  management of diabetes  Case Manager Clinical Goal(s):  Over the next 90 days, patient will demonstrate improved adherence to prescribed treatment plan for diabetes self care/management as evidenced by:  . daily monitoring and recording of CBG  . adherence to ADA/ carb modified diet . exercise 3 days/week . adherence to prescribed medication regimen : Daughter giving patient all of her daily medications, the daughter says the patient will not take her medication if she does not direct  her to take . Decreased visits to the ED as evidence by patient being more independent and actively participating in her care: Patient recently in the ED related to cellulitis and other chronic disease processes  Interventions:  . Encouraged patient's daughter to take the patient to the ED to be evaluated due to the current stated condition of the patient  RNCM: Daughter [name]) I don't know how to help my mom  Current Barriers:  . Chronic Disease Managemant support, education, and care coordination needs related to HTN, DMII, and Schizoaffective disorder  Clinical Goal(s) related to HTN, DMII, and Schizo affective disorder:  Over the next 30 days, patient will: . Work with the care management team to address educational, disease management, and care coordination needs  . Begin or continue self health monitoring activities as directed today The patients daughter, [name] is concerned because the patient is not taking her medications as directed, not doing daily self care, and refusing test when being evaluated at the ED on 05-21-2019 . Call provider office for new or worsened signs and symptoms New or worsened symptom related to HTN, DM and Schizoaffective disorder . Call care management team with questions or concerns . Verbalize basic understanding of patient centered plan of care established today   Interventions related to HTN, DMII, and Schizo affective disorder:  . Evaluation of current treatment plans and patient's adherence to plan as established by provider  . Assessed patient understanding of disease states: The patients daughter, [name] wants to help the patient and does not know how to take care of the patients complex medical conditions . Assessed patient's education and care coordination needs: Working with the [place] and Pharmacist  . Provided disease specific education to patient/daughter on the need to be evaluated in the ED due to current concerns and condition of  the patient . Collaborated with appropriate clinical care team members regarding patient needs . Report made to [facility] of [facility] related to concerns and safety of the patient 05-22-2019.  Melinda with [facility] of [facility] has returned a call and states the report has been accepted and has been assigned to [employer]  Patient Self Care Activities related to HTN, DMII, and schizo affective disorder:  . Patient is unable to independently self-manage chronic health conditions   Ms. Pamintuan was given information about Chronic Care Management services today including:  1. CCM service includes personalized support from designated clinical staff supervised by her physician, including individualized plan of care and coordination with other care providers 2. 24/7 contact phone numbers for assistance for urgent and routine care needs. 3. Service will only be billed when office clinical staff spend 20 minutes or more in a month to coordinate care. 4. Only one practitioner may furnish and bill the service in a calendar month. 5. The patient may stop CCM services at any time (effective at the end of the month) by phone call to the office staff. 6. The patient will be responsible for cost sharing (co-pay) of up to 20%  of the service fee (after annual deductible is met).    Plan:   The care management team will reach out to the patient again over the next 30 days.   Noreene Larsson RN, MSN, [place] Mobile: [phone]

## 2019-05-22 NOTE — ED Notes (Signed)
RN called daughter to advised of still impending d/c and wanted to confirm pick up; daughter's husband answered and states daughter is emotionally not able to speak; Son in law was very understanding and advised they can come pick patient up; He asked for information to file complaint against EDP and family was provided # to patient experience; Family will be here to pick patient up in 29 minutes-[name],RN

## 2019-05-22 NOTE — ED Provider Notes (Signed)
Medical screening examination/treatment/procedure(s) were conducted as a shared visit with non-physician practitioner(s) and myself.  I personally evaluated the patient during the encounter.  I evaluated patient yesterday at [facility]. She is returning today because she is not taking care of herself and was sent for psychiatric evaluation. She says she is having a hard time taking care of some ADLs because she is wearing a boot. Her affect is somewhat odd but she is calm and pleasant. She was yesterday as well. She has a psychiatric history but she is not acutely psychotic. We did not find an acute medical or psychiatric condition that required hospitalization or further testing. Family is very upset about her being discharged.  I spoke with her sister on the phone after she had numerous conversation with staff and [place]. She became inappropriately upset when I erroneously referred to the patient as her mother instead of her sister. She spent the entirety of her time speaking letting me know that she was recording the call, taking note of behavior by our staff that she felt was inappropriate, she was going to file complaints, etc. I acknowledged her concerns but kept trying to steer the conversation back towards the patient and her care. I wasn't successful. I found her sister to be extremely contentious and aggressive.    Virgel Manifold, MD 05/22/19 2352

## 2019-05-22 NOTE — Social Work (Signed)
EDCSW met with Pt at bedside to inform Pt that [facility] referrals had been made but that due to volume of referrals it may be a few days before they get back to Pt.

## 2019-05-22 NOTE — ED Notes (Signed)
RN called pt daughter to advise of impending d/c home and she will need family to pick her up; daughter was not understanding of decision; RN explained patient has been medically and psy cleared and has been evaluated; Daughter request call back from EDP; RN notified EDP of request-[name],RN

## 2019-05-22 NOTE — ED Triage Notes (Signed)
Pts states "I was sent over here to be evaluated so I can get out of this wheelchair' pt has broken left foot and states she hasnt been able to take care of herself. When asked to elaborate more she sattes she doesn't know and states that her doctor told her to come here.

## 2019-05-23 ENCOUNTER — Inpatient Hospital Stay
Admission: EM | Admit: 2019-05-23 | Discharge: 2019-06-04 | DRG: 982 | Disposition: A | Discharge status: Skilled Nursing Facility | Payer: Medicare Other | Attending: Surgery | Admitting: Surgery

## 2019-05-23 ENCOUNTER — Telehealth: Payer: Self-pay

## 2019-05-23 ENCOUNTER — Other Ambulatory Visit: Payer: Self-pay

## 2019-05-23 ENCOUNTER — Ambulatory Visit: Payer: Self-pay | Admitting: Licensed Clinical Social Worker

## 2019-05-23 ENCOUNTER — Inpatient Hospital Stay: Payer: Medicare Other

## 2019-05-23 DIAGNOSIS — E1149 Type 2 diabetes mellitus with other diabetic neurological complication: Secondary | ICD-10-CM | POA: Diagnosis present

## 2019-05-23 DIAGNOSIS — E785 Hyperlipidemia, unspecified: Secondary | ICD-10-CM | POA: Diagnosis not present

## 2019-05-23 DIAGNOSIS — R262 Difficulty in walking, not elsewhere classified: Secondary | ICD-10-CM | POA: Diagnosis not present

## 2019-05-23 DIAGNOSIS — E871 Hypo-osmolality and hyponatremia: Secondary | ICD-10-CM | POA: Diagnosis present

## 2019-05-23 DIAGNOSIS — Z794 Long term (current) use of insulin: Secondary | ICD-10-CM | POA: Diagnosis not present

## 2019-05-23 DIAGNOSIS — F209 Schizophrenia, unspecified: Secondary | ICD-10-CM | POA: Diagnosis not present

## 2019-05-23 DIAGNOSIS — D62 Acute posthemorrhagic anemia: Secondary | ICD-10-CM | POA: Diagnosis not present

## 2019-05-23 DIAGNOSIS — Z9119 Patient's noncompliance with other medical treatment and regimen: Secondary | ICD-10-CM

## 2019-05-23 DIAGNOSIS — K2100 Gastro-esophageal reflux disease with esophagitis, without bleeding: Secondary | ICD-10-CM | POA: Diagnosis not present

## 2019-05-23 DIAGNOSIS — Z20822 Contact with and (suspected) exposure to covid-19: Secondary | ICD-10-CM | POA: Diagnosis not present

## 2019-05-23 DIAGNOSIS — L0390 Cellulitis, unspecified: Secondary | ICD-10-CM | POA: Diagnosis present

## 2019-05-23 DIAGNOSIS — Z7901 Long term (current) use of anticoagulants: Secondary | ICD-10-CM

## 2019-05-23 DIAGNOSIS — Z9889 Other specified postprocedural states: Secondary | ICD-10-CM | POA: Diagnosis not present

## 2019-05-23 DIAGNOSIS — E1142 Type 2 diabetes mellitus with diabetic polyneuropathy: Secondary | ICD-10-CM | POA: Diagnosis present

## 2019-05-23 DIAGNOSIS — R110 Nausea: Secondary | ICD-10-CM | POA: Diagnosis not present

## 2019-05-23 DIAGNOSIS — M2041 Other hammer toe(s) (acquired), right foot: Secondary | ICD-10-CM | POA: Diagnosis present

## 2019-05-23 DIAGNOSIS — K219 Gastro-esophageal reflux disease without esophagitis: Secondary | ICD-10-CM | POA: Diagnosis present

## 2019-05-23 DIAGNOSIS — D72829 Elevated white blood cell count, unspecified: Secondary | ICD-10-CM

## 2019-05-23 DIAGNOSIS — E7849 Other hyperlipidemia: Secondary | ICD-10-CM | POA: Diagnosis not present

## 2019-05-23 DIAGNOSIS — E1165 Type 2 diabetes mellitus with hyperglycemia: Secondary | ICD-10-CM | POA: Diagnosis not present

## 2019-05-23 DIAGNOSIS — K2970 Gastritis, unspecified, without bleeding: Secondary | ICD-10-CM | POA: Diagnosis not present

## 2019-05-23 DIAGNOSIS — Z87891 Personal history of nicotine dependence: Secondary | ICD-10-CM

## 2019-05-23 DIAGNOSIS — I10 Essential (primary) hypertension: Secondary | ICD-10-CM | POA: Diagnosis present

## 2019-05-23 DIAGNOSIS — E11621 Type 2 diabetes mellitus with foot ulcer: Secondary | ICD-10-CM | POA: Diagnosis not present

## 2019-05-23 DIAGNOSIS — M2550 Pain in unspecified joint: Secondary | ICD-10-CM | POA: Diagnosis not present

## 2019-05-23 DIAGNOSIS — S93312A Subluxation of tarsal joint of left foot, initial encounter: Secondary | ICD-10-CM | POA: Diagnosis not present

## 2019-05-23 DIAGNOSIS — K6389 Other specified diseases of intestine: Secondary | ICD-10-CM | POA: Diagnosis not present

## 2019-05-23 DIAGNOSIS — L03119 Cellulitis of unspecified part of limb: Secondary | ICD-10-CM | POA: Diagnosis present

## 2019-05-23 DIAGNOSIS — E860 Dehydration: Secondary | ICD-10-CM | POA: Diagnosis not present

## 2019-05-23 DIAGNOSIS — Z803 Family history of malignant neoplasm of breast: Secondary | ICD-10-CM

## 2019-05-23 DIAGNOSIS — Z7982 Long term (current) use of aspirin: Secondary | ICD-10-CM

## 2019-05-23 DIAGNOSIS — F259 Schizoaffective disorder, unspecified: Secondary | ICD-10-CM | POA: Diagnosis not present

## 2019-05-23 DIAGNOSIS — Z978 Presence of other specified devices: Secondary | ICD-10-CM | POA: Diagnosis not present

## 2019-05-23 DIAGNOSIS — K922 Gastrointestinal hemorrhage, unspecified: Secondary | ICD-10-CM

## 2019-05-23 DIAGNOSIS — E11628 Type 2 diabetes mellitus with other skin complications: Secondary | ICD-10-CM | POA: Diagnosis present

## 2019-05-23 DIAGNOSIS — L03115 Cellulitis of right lower limb: Secondary | ICD-10-CM | POA: Diagnosis not present

## 2019-05-23 DIAGNOSIS — E1122 Type 2 diabetes mellitus with diabetic chronic kidney disease: Secondary | ICD-10-CM | POA: Diagnosis present

## 2019-05-23 DIAGNOSIS — C189 Malignant neoplasm of colon, unspecified: Secondary | ICD-10-CM | POA: Diagnosis present

## 2019-05-23 DIAGNOSIS — C187 Malignant neoplasm of sigmoid colon: Secondary | ICD-10-CM | POA: Diagnosis present

## 2019-05-23 DIAGNOSIS — Z833 Family history of diabetes mellitus: Secondary | ICD-10-CM

## 2019-05-23 DIAGNOSIS — G629 Polyneuropathy, unspecified: Secondary | ICD-10-CM | POA: Diagnosis not present

## 2019-05-23 DIAGNOSIS — Z6838 Body mass index (BMI) 38.0-38.9, adult: Secondary | ICD-10-CM

## 2019-05-23 DIAGNOSIS — E1169 Type 2 diabetes mellitus with other specified complication: Secondary | ICD-10-CM | POA: Diagnosis not present

## 2019-05-23 DIAGNOSIS — N1831 Chronic kidney disease, stage 3a: Secondary | ICD-10-CM | POA: Diagnosis not present

## 2019-05-23 DIAGNOSIS — I129 Hypertensive chronic kidney disease with stage 1 through stage 4 chronic kidney disease, or unspecified chronic kidney disease: Secondary | ICD-10-CM | POA: Diagnosis not present

## 2019-05-23 DIAGNOSIS — F329 Major depressive disorder, single episode, unspecified: Secondary | ICD-10-CM | POA: Diagnosis present

## 2019-05-23 DIAGNOSIS — R5381 Other malaise: Secondary | ICD-10-CM | POA: Diagnosis not present

## 2019-05-23 DIAGNOSIS — Z79899 Other long term (current) drug therapy: Secondary | ICD-10-CM | POA: Diagnosis not present

## 2019-05-23 DIAGNOSIS — L03116 Cellulitis of left lower limb: Secondary | ICD-10-CM | POA: Diagnosis not present

## 2019-05-23 DIAGNOSIS — M7989 Other specified soft tissue disorders: Secondary | ICD-10-CM | POA: Diagnosis not present

## 2019-05-23 DIAGNOSIS — E11610 Type 2 diabetes mellitus with diabetic neuropathic arthropathy: Secondary | ICD-10-CM

## 2019-05-23 DIAGNOSIS — D638 Anemia in other chronic diseases classified elsewhere: Secondary | ICD-10-CM | POA: Diagnosis present

## 2019-05-23 DIAGNOSIS — Z8249 Family history of ischemic heart disease and other diseases of the circulatory system: Secondary | ICD-10-CM | POA: Diagnosis not present

## 2019-05-23 DIAGNOSIS — R197 Diarrhea, unspecified: Secondary | ICD-10-CM | POA: Diagnosis not present

## 2019-05-23 DIAGNOSIS — M6281 Muscle weakness (generalized): Secondary | ICD-10-CM | POA: Diagnosis not present

## 2019-05-23 DIAGNOSIS — I708 Atherosclerosis of other arteries: Secondary | ICD-10-CM | POA: Diagnosis not present

## 2019-05-23 DIAGNOSIS — G9009 Other idiopathic peripheral autonomic neuropathy: Secondary | ICD-10-CM | POA: Diagnosis not present

## 2019-05-23 DIAGNOSIS — E119 Type 2 diabetes mellitus without complications: Secondary | ICD-10-CM | POA: Diagnosis not present

## 2019-05-23 DIAGNOSIS — R1031 Right lower quadrant pain: Secondary | ICD-10-CM | POA: Diagnosis not present

## 2019-05-23 DIAGNOSIS — M14672 Charcot's joint, left ankle and foot: Secondary | ICD-10-CM | POA: Diagnosis not present

## 2019-05-23 DIAGNOSIS — S93315A Dislocation of tarsal joint of left foot, initial encounter: Secondary | ICD-10-CM | POA: Diagnosis not present

## 2019-05-23 DIAGNOSIS — R195 Other fecal abnormalities: Secondary | ICD-10-CM | POA: Diagnosis not present

## 2019-05-23 DIAGNOSIS — R140 Abdominal distension (gaseous): Secondary | ICD-10-CM | POA: Diagnosis not present

## 2019-05-23 DIAGNOSIS — K317 Polyp of stomach and duodenum: Secondary | ICD-10-CM | POA: Diagnosis not present

## 2019-05-23 DIAGNOSIS — Z7401 Bed confinement status: Secondary | ICD-10-CM | POA: Diagnosis not present

## 2019-05-23 DIAGNOSIS — R531 Weakness: Secondary | ICD-10-CM | POA: Diagnosis not present

## 2019-05-23 DIAGNOSIS — R5383 Other fatigue: Secondary | ICD-10-CM | POA: Diagnosis not present

## 2019-05-23 DIAGNOSIS — D649 Anemia, unspecified: Secondary | ICD-10-CM

## 2019-05-23 DIAGNOSIS — IMO0002 Reserved for concepts with insufficient information to code with codable children: Secondary | ICD-10-CM | POA: Diagnosis present

## 2019-05-23 DIAGNOSIS — D509 Iron deficiency anemia, unspecified: Secondary | ICD-10-CM | POA: Diagnosis not present

## 2019-05-23 DIAGNOSIS — G709 Myoneural disorder, unspecified: Secondary | ICD-10-CM | POA: Diagnosis not present

## 2019-05-23 DIAGNOSIS — L97522 Non-pressure chronic ulcer of other part of left foot with fat layer exposed: Secondary | ICD-10-CM | POA: Diagnosis present

## 2019-05-23 DIAGNOSIS — R52 Pain, unspecified: Secondary | ICD-10-CM

## 2019-05-23 DIAGNOSIS — U071 COVID-19: Secondary | ICD-10-CM | POA: Diagnosis not present

## 2019-05-23 DIAGNOSIS — Z9049 Acquired absence of other specified parts of digestive tract: Secondary | ICD-10-CM | POA: Diagnosis not present

## 2019-05-23 DIAGNOSIS — E7800 Pure hypercholesterolemia, unspecified: Secondary | ICD-10-CM | POA: Diagnosis not present

## 2019-05-23 LAB — CBC
HCT: 26.5 % — ABNORMAL LOW (ref 36.0–46.0)
Hemoglobin: 8.2 g/dL — ABNORMAL LOW (ref 12.0–15.0)
MCH: 24 pg — ABNORMAL LOW (ref 26.0–34.0)
MCHC: 30.9 g/dL (ref 30.0–36.0)
MCV: 77.5 fL — ABNORMAL LOW (ref 80.0–100.0)
Platelets: 309 10*3/uL (ref 150–400)
RBC: 3.42 MIL/uL — ABNORMAL LOW (ref 3.87–5.11)
RDW: 16.4 % — ABNORMAL HIGH (ref 11.5–15.5)
WBC: 8.5 10*3/uL (ref 4.0–10.5)
nRBC: 0 % (ref 0.0–0.2)

## 2019-05-23 LAB — TYPE AND SCREEN
ABO/RH(D): O NEG
Antibody Screen: NEGATIVE

## 2019-05-23 LAB — BASIC METABOLIC PANEL
Anion gap: 10 (ref 5–15)
BUN: 16 mg/dL (ref 6–20)
CO2: 24 mmol/L (ref 22–32)
Calcium: 9 mg/dL (ref 8.9–10.3)
Chloride: 95 mmol/L — ABNORMAL LOW (ref 98–111)
Creatinine, Ser: 1.44 mg/dL — ABNORMAL HIGH (ref 0.44–1.00)
GFR calc Af Amer: 47 mL/min — ABNORMAL LOW (ref >60)
GFR calc non Af Amer: 41 mL/min — ABNORMAL LOW (ref >60)
Glucose, Bld: 161 mg/dL — ABNORMAL HIGH (ref 70–99)
Potassium: 4.4 mmol/L (ref 3.5–5.1)
Sodium: 129 mmol/L — ABNORMAL LOW (ref 135–145)

## 2019-05-23 LAB — URINE CULTURE

## 2019-05-23 LAB — LACTIC ACID, PLASMA
Lactic Acid, Venous: 1.8 mmol/L (ref 0.5–1.9)
Lactic Acid, Venous: 1.6 mmol/L (ref 0.5–1.9)

## 2019-05-23 LAB — TROPONIN I (HIGH SENSITIVITY)
Troponin I (High Sensitivity): 4 ng/L (ref <18)
Troponin I (High Sensitivity): 3 ng/L (ref <18)

## 2019-05-23 MED ORDER — LISINOPRIL 20 MG PO TABS
20.0000 mg | ORAL_TABLET | Freq: Every day | ORAL | Status: DC
  Administered 2019-05-24 – 2019-05-27 (×4): 20 mg via ORAL
  Filled 2019-05-23 (×4): qty 1

## 2019-05-23 MED ORDER — BUPROPION HCL ER (XL) 150 MG PO TB24
150.0000 mg | ORAL_TABLET | Freq: Every morning | ORAL | Status: DC
  Administered 2019-05-24 – 2019-06-04 (×11): 150 mg via ORAL
  Filled 2019-05-23 (×11): qty 1

## 2019-05-23 MED ORDER — SODIUM CHLORIDE 0.9 % IV SOLN: INTRAVENOUS | Status: DC

## 2019-05-23 MED ORDER — SODIUM CHLORIDE 0.9 % IV SOLN
2.0000 g | INTRAVENOUS | Status: DC
  Administered 2019-05-24 – 2019-05-25 (×3): 2 g via INTRAVENOUS
  Filled 2019-05-23: qty 2
  Filled 2019-05-23 (×3): qty 20

## 2019-05-23 MED ORDER — ONDANSETRON HCL 4 MG/2ML IJ SOLN
4.0000 mg | Freq: Four times a day (QID) | INTRAMUSCULAR | Status: DC | PRN
  Administered 2019-05-30: 4 mg via INTRAVENOUS
  Filled 2019-05-23: qty 2

## 2019-05-23 MED ORDER — ONDANSETRON HCL 4 MG PO TABS: 4.0000 mg | ORAL_TABLET | Freq: Three times a day (TID) | ORAL | Status: DC | PRN

## 2019-05-23 MED ORDER — PANTOPRAZOLE SODIUM 40 MG IV SOLR: 40.0000 mg | Freq: Two times a day (BID) | INTRAVENOUS | Status: DC

## 2019-05-23 MED ORDER — SODIUM CHLORIDE 0.9 % IV SOLN
8.0000 mg/h | INTRAVENOUS | Status: AC
  Administered 2019-05-23 – 2019-05-25 (×4): 8 mg/h via INTRAVENOUS
  Filled 2019-05-23 (×4): qty 80

## 2019-05-23 MED ORDER — INSULIN ASPART 100 UNIT/ML ~~LOC~~ SOLN
0.0000 [IU] | Freq: Three times a day (TID) | SUBCUTANEOUS | Status: DC
  Administered 2019-05-24: 3 [IU] via SUBCUTANEOUS
  Administered 2019-05-25 (×2): 2 [IU] via SUBCUTANEOUS
  Administered 2019-05-27 – 2019-05-30 (×4): 3 [IU] via SUBCUTANEOUS
  Administered 2019-05-30: 2 [IU] via SUBCUTANEOUS
  Administered 2019-05-30: 5 [IU] via SUBCUTANEOUS
  Administered 2019-05-31: 3 [IU] via SUBCUTANEOUS
  Administered 2019-05-31: 5 [IU] via SUBCUTANEOUS
  Administered 2019-05-31 – 2019-06-02 (×5): 3 [IU] via SUBCUTANEOUS
  Administered 2019-06-02: 2 [IU] via SUBCUTANEOUS
  Administered 2019-06-03 (×3): 3 [IU] via SUBCUTANEOUS
  Administered 2019-06-04: 2 [IU] via SUBCUTANEOUS
  Filled 2019-05-23 (×21): qty 1

## 2019-05-23 MED ORDER — ONDANSETRON HCL 4 MG PO TABS
4.0000 mg | ORAL_TABLET | Freq: Four times a day (QID) | ORAL | Status: DC | PRN
  Administered 2019-05-29 – 2019-06-04 (×2): 4 mg via ORAL
  Filled 2019-05-23 (×3): qty 1

## 2019-05-23 MED ORDER — SODIUM CHLORIDE 0.9 % IV SOLN: Freq: Once | INTRAVENOUS | Status: AC

## 2019-05-23 MED ORDER — SODIUM CHLORIDE 0.9 % IV SOLN
80.0000 mg | Freq: Once | INTRAVENOUS | Status: AC
  Administered 2019-05-23: 80 mg via INTRAVENOUS
  Filled 2019-05-23 (×2): qty 80

## 2019-05-23 MED ORDER — ATORVASTATIN CALCIUM 20 MG PO TABS
40.0000 mg | ORAL_TABLET | Freq: Every day | ORAL | Status: DC
  Administered 2019-05-24 – 2019-06-04 (×11): 40 mg via ORAL
  Filled 2019-05-23 (×11): qty 2

## 2019-05-23 MED ORDER — INSULIN GLARGINE 100 UNIT/ML ~~LOC~~ SOLN
60.0000 [IU] | Freq: Every day | SUBCUTANEOUS | Status: DC
  Administered 2019-05-24 – 2019-05-25 (×2): 60 [IU] via SUBCUTANEOUS
  Filled 2019-05-23 (×4): qty 0.6

## 2019-05-23 MED ORDER — ARIPIPRAZOLE 10 MG PO TABS
10.0000 mg | ORAL_TABLET | Freq: Every day | ORAL | Status: DC
  Administered 2019-05-24 – 2019-06-03 (×12): 10 mg via ORAL
  Filled 2019-05-23 (×13): qty 1

## 2019-05-23 MED ORDER — INSULIN ASPART 100 UNIT/ML ~~LOC~~ SOLN
12.0000 [IU] | Freq: Three times a day (TID) | SUBCUTANEOUS | Status: DC
  Administered 2019-05-24: 12 [IU] via SUBCUTANEOUS
  Filled 2019-05-23: qty 1

## 2019-05-23 NOTE — ED Notes (Signed)
Dr. Goodman at bedside.  

## 2019-05-23 NOTE — ED Notes (Signed)
PT GAVE VERBAL CONSENT TO KEEP HER DAUGHTER UPDATED VIA PHONE

## 2019-05-23 NOTE — ED Notes (Signed)
RN has gone over d/c papers and instructions with patient; pt fully understands d/c instructions and follow-up; pt expressed the need to maybe go to Rehab facility so she is no longer a burden to daughter and her family; RN advised of consult placed for a face face evaluation for need for home health placed by ED social worker; pt expressed concern for family's behavior towards ED staff; Pt denies SI/HI and has been pleasant while waiting in ED-[name],RN

## 2019-05-23 NOTE — ED Triage Notes (Signed)
Pt arrives to ed via POV from home with daughter. Daughter reports pt hx of schizophrenia, and mentally disabled therefore unable to give complete hx. Daughter states pt was nauseous and would not get up last week with headache. Tested for covid with neg results. Scheduled for echo and stress test but was canceled due to possible covid at that time. Pt has been c/o leg pain. cellulitis in rt leg, currently on antibiotics. Pt reports 2 episodes of diarrhea last night. Pt referred to ED for possible sepsis by Dr. Ubaldo Glassing and  Dr Luana Shu. Pt also has boot on left foot for previous wound. Redness noted on the right lower leg. NAD noted at this time

## 2019-05-23 NOTE — Chronic Care Management (AMB) (Signed)
  Chronic Care Management    Clinical Social Work Follow Up Note  05/23/2019 Name: ERIELLE GAWRONSKI MRN: [id number] DOB: Aug 25, 1965  Referred by: [name], DO Reason for referral : Care Coordination   ARYANI DAFFERN is a 54 y.o. year old female who is a primary care patient of [name], DO. The care management team was consulted for assistance with care management and care coordination needs.    Review of patient status, including review of consultants reports, relevant laboratory and other test results, and collaboration with appropriate care team members and the patient's provider was performed as part of comprehensive patient evaluation and provision of chronic care management services.  LCSW received in basket message from inpatient LCSW last night stating "Hi Javaughn Opdahl, I'm reaching out to let you know that this Pt was seen at [facility] ED today. Pt reported that her doc recommended that she come her for a psychiatric evaluation. She was cleared medically and by pschiatric services and discharged. Our TOC RN case manager made several referals to home health agencies throughout [place]." LCSW will update CCM team and PCP.  [name], BSW, MSW, [email or web address] Phone: [phone]

## 2019-05-23 NOTE — ED Provider Notes (Signed)
[facility] Emergency Department Provider Note   ____________________________________________   I have reviewed the triage vital signs and the nursing notes.   HISTORY  Chief Complaint Infection  History limited by: Not Limited   HPI [name] is a 54 y.o. female who presents to the emergency department today accompanied by daughter because of concerns for possible sepsis and right lower leg cellulitis.  Patient was diagnosed with cellulitis a couple of days ago.  Daughter states in talking to her podiatrist today they are worried that she might be septic.  Patient did have some diarrhea last night.  No fevers although has had some chills.  The patient apparently has had history of sepsis secondary to cellulitis in the past.  Was started on antibiotics.  Patient has had recent emergency department visits for both the cellulitis as well as concerns about patient's ability to care for self.  Patient has not noticed any bloody stool or black or tarry stool.  Records reviewed. Per medical record review patient has a history of two recent ER visits.   Past Medical History:  Diagnosis Date  . Depression   . Diabetes mellitus without complication ([place])   . Hyperlipidemia   . Hypertension   . Neuromuscular disorder ([place])   . Schizo affective schizophrenia ([place])    abilify and pazil    Patient Active Problem List   Diagnosis Date Noted  . Neuropathic foot ulcer, left, with fat layer exposed ([place]) 02/05/2019  . Hyperkalemia 02/05/2019  . Osteomyelitis ([place]) 12/11/2018  . Pyelonephritis 12/14/2017  . Morbid obesity ([place]) 05/10/2017  . Long-term use of high-risk medication 05/10/2017  . Hyperlipidemia associated with type 2 diabetes mellitus ([place]) 06/27/2016  . Hammer toe of second toe of right foot 04/20/2016  . Essential hypertension 06/29/2015  . Schizoaffective disorder ([place]) 06/29/2015  . DM (diabetes mellitus), type 2, uncontrolled w/neurologic  complication ([place]) 63/14/9702  . Polyneuropathy 04/02/2014    Past Surgical History:  Procedure Laterality Date  . CHOLECYSTECTOMY    . IRRIGATION AND DEBRIDEMENT FOOT Right 02/26/2016   Procedure: RIGHT 2ND TOE DEBRIDEMENT;  Surgeon: Samara Deist, DPM;  Location: [facility] ORS;  Service: Podiatry;  Laterality: Right;  . IRRIGATION AND DEBRIDEMENT FOOT Left 12/13/2018   Procedure: IRRIGATION AND DEBRIDEMENT FOOT;  Surgeon: Caroline More, DPM;  Location: [facility] ORS;  Service: Podiatry;  Laterality: Left;    Prior to Admission medications   Medication Sig Start Date End Date Taking? Authorizing Provider  ARIPiprazole (ABILIFY) 10 MG tablet Take 10 mg by mouth at bedtime.     [provider]  ascorbic acid (VITAMIN C) 500 MG tablet Take 500 mg by mouth daily.    [provider]  aspirin EC 81 MG tablet Take 81 mg by mouth 3 (three) times a week.    [provider]  atorvastatin (LIPITOR) 40 MG tablet Take 1 tablet (40 mg total) by mouth daily. 02/20/19   Karamalegos, Devonne Doughty, DO  buPROPion (WELLBUTRIN XL) 150 MG 24 hr tablet Take 150 mg by mouth every morning.     [provider]  cephALEXin (KEFLEX) 500 MG capsule Take 1 capsule (500 mg total) by mouth 3 (three) times daily. Patient not taking: Reported on 05/22/2019 05/21/19   Virgel Manifold, MD  ELIQUIS 2.5 MG TABS tablet Take 2.5 mg by mouth 2 (two) times daily. 05/08/19   [provider]  HUMALOG KWIKPEN 100 UNIT/ML KwikPen Inject 0.05 mLs (5 Units total) into the skin 3 (three)  times daily with meals. + Sliding scale Patient taking differently: Inject 12 Units into the skin See admin instructions. Inject 12 units into the skin 3 times with meals, plus 2 units for every 50 points above 150 03/23/18   Karamalegos, [name] J, DO  hydrochlorothiazide (HYDRODIURIL) 12.5 MG tablet Take 1 tablet (12.5 mg total) by mouth daily. 04/29/19   Karamalegos, Devonne Doughty, DO  Insulin Glargine (LANTUS SOLOSTAR) 100  UNIT/ML Solostar Pen Inject 40 Units into the skin daily. Adjust dose as advised Patient taking differently: Inject 60 Units into the skin daily before breakfast.  12/17/18   [name], MD  Lancets [facility] ULTRASOFT) lancets Use to check blood sugar up to 3 x daily 03/18/19   Karamalegos, Devonne Doughty, DO  lisinopril (ZESTRIL) 20 MG tablet Take 1 tablet (20 mg total) by mouth daily. 04/17/19   Karamalegos, Devonne Doughty, DO  metFORMIN (GLUCOPHAGE) 1000 MG tablet Take 1 tablet (1,000 mg total) by mouth 2 (two) times daily with a meal. 02/20/19   Karamalegos, [name] J, DO  ondansetron (ZOFRAN) 4 MG tablet Take 1 tablet (4 mg total) by mouth every 6 (six) hours. Patient taking differently: Take 4 mg by mouth every 8 (eight) hours as needed for nausea or vomiting.  05/16/19   Wurst, [place], PA-C  ONETOUCH VERIO test strip Check blood sugar up to 5 x daily as needed 04/17/19   [name], DO    Allergies Patient has no known allergies.  Family History  Problem Relation Age of Onset  . Breast cancer Cousin        maternal cousin  . Hypertension Mother   . Hyperthyroidism Mother   . Diabetes Maternal Grandmother   . Hypertension Maternal Grandmother   . Cancer Maternal Grandmother        unknown type  . Diabetes Maternal Grandfather   . Hypertension Maternal Grandfather   . Diabetes Paternal Grandmother   . Hypertension Paternal Grandmother   . Diabetes Paternal Grandfather   . Hypertension Paternal Grandfather     Social History Social History   Tobacco Use  . Smoking status: Former Smoker    Packs/day: 3.00    Years: 30.00    Pack years: 90.00    Types: Cigarettes    Quit date: 05/02/2009    Years since quitting: 10.0  . Smokeless tobacco: Former Systems developer  . Tobacco comment: Pt reported  quitting in 2011  Substance Use Topics  . Alcohol use: No  . Drug use: No    Review of Systems Constitutional: No fever. Positive for chills.  Eyes: No visual changes. ENT:  No sore throat. Cardiovascular: Denies chest pain. Respiratory: Denies shortness of breath. Gastrointestinal: No abdominal pain. Positive for diarrhea.   Genitourinary: Negative for dysuria. Musculoskeletal: Negative for back pain. Skin: Positive for redness to right lower leg.  Neurological: Negative for headaches, focal weakness or numbness.  ____________________________________________   PHYSICAL EXAM:  VITAL SIGNS: ED Triage Vitals  Enc Vitals Group     BP 05/23/19 1316 135/78     Pulse Rate 05/23/19 1316 99     Resp 05/23/19 1316 20     Temp 05/23/19 1316 98.4 F (36.9 C)     Temp src --      SpO2 05/23/19 1316 99 %     Weight 05/23/19 1318 265 lb (120.2 kg)     Height 05/23/19 1318 5\' 10"  (1.778 m)     Head Circumference --      Peak Flow --  Pain Score 05/23/19 1317 4   Constitutional: Alert and oriented.  Eyes: Conjunctivae are normal.  ENT      Head: Normocephalic and atraumatic.      Nose: No congestion/rhinnorhea.      Mouth/Throat: Mucous membranes are moist.      Neck: No stridor. Hematological/Lymphatic/Immunilogical: No cervical lymphadenopathy. Cardiovascular: Normal rate, regular rhythm.  No murmurs, rubs, or gallops.  Respiratory: Normal respiratory effort without tachypnea nor retractions. Breath sounds are clear and equal bilaterally. No wheezes/rales/rhonchi. Gastrointestinal: Soft and non tender. No rebound. No guarding.  Rectal: Brown stool on glove. GUIAC positive.  Musculoskeletal: Normal range of motion in all extremities. No lower extremity edema. Neurologic:  Normal speech and language. No gross focal neurologic deficits are appreciated.  Skin:  Skin is warm, dry and intact. No rash noted. Psychiatric: Mood and affect are normal. Speech and behavior are normal. Patient exhibits appropriate insight and judgment.  ____________________________________________    LABS (pertinent positives/negatives)  Trop hs 3 Lactic acid 1.6 CBC wbc  8.5, hgb 8.2, plt 309 BMP na 129, k 4.4, glu 161, cr 1.44  ____________________________________________   EKG  I, Nance Pear, attending physician, personally viewed and interpreted this EKG  EKG Time: 1337 Rate: 100 Rhythm: normal sinus rhythm Axis: normal Intervals: qtc 428 QRS: narrow ST changes: no st elevation Impression: normal ekg  ____________________________________________    RADIOLOGY  None  ____________________________________________   PROCEDURES  Procedures  ____________________________________________   INITIAL IMPRESSION / ASSESSMENT AND PLAN / ED COURSE  Pertinent labs & imaging results that were available during my care of the patient were reviewed by me and considered in my medical decision making (see chart for details).   Patient brought in by family because of concerns for possible sepsis secondary to right lower leg cellulitis.  On exam patient is afebrile.  No lactic acidosis or leukocytosis.  At this point I doubt patient is septic from her cellulitis.  Of note however hemoglobin is 8.2 today when it was checked 2 days ago it was 10.2.  Patient was guaiac positive.  Due to concerns for GI bleed causing the anemia.  I discussed this with patient and family.  Will plan on admission and I will start IV Protonix.   ____________________________________________   FINAL CLINICAL IMPRESSION(S) / ED DIAGNOSES  Final diagnoses:  Anemia, unspecified type  Gastrointestinal hemorrhage, unspecified gastrointestinal hemorrhage type  Cellulitis, unspecified cellulitis site     Note: This dictation was prepared with Dragon dictation. Any transcriptional errors that result from this process are unintentional     Nance Pear, MD 05/23/19 [phone]

## 2019-05-23 NOTE — ED Notes (Signed)
Pt to ED for chief complaint of chest pain, possible sepsis from cellulitis to right leg dx 2 days ago. Minimal redness noted to right leg, no heat noted. Pt poor historian, daughter in treatment room with her. Chest pain ongoing for weeks. Pt in NAD at this time.

## 2019-05-24 ENCOUNTER — Encounter: Payer: Self-pay | Admitting: Emergency Medicine

## 2019-05-24 DIAGNOSIS — D649 Anemia, unspecified: Secondary | ICD-10-CM | POA: Insufficient documentation

## 2019-05-24 DIAGNOSIS — L0390 Cellulitis, unspecified: Secondary | ICD-10-CM

## 2019-05-24 DIAGNOSIS — K922 Gastrointestinal hemorrhage, unspecified: Secondary | ICD-10-CM

## 2019-05-24 DIAGNOSIS — E1165 Type 2 diabetes mellitus with hyperglycemia: Secondary | ICD-10-CM

## 2019-05-24 DIAGNOSIS — E11628 Type 2 diabetes mellitus with other skin complications: Secondary | ICD-10-CM

## 2019-05-24 DIAGNOSIS — L03119 Cellulitis of unspecified part of limb: Secondary | ICD-10-CM

## 2019-05-24 DIAGNOSIS — E1149 Type 2 diabetes mellitus with other diabetic neurological complication: Secondary | ICD-10-CM

## 2019-05-24 LAB — GLUCOSE, CAPILLARY
Glucose-Capillary: 120 mg/dL — ABNORMAL HIGH (ref 70–99)
Glucose-Capillary: 165 mg/dL — ABNORMAL HIGH (ref 70–99)
Glucose-Capillary: 106 mg/dL — ABNORMAL HIGH (ref 70–99)

## 2019-05-24 LAB — CBC WITH DIFFERENTIAL/PLATELET
Abs Immature Granulocytes: 0.03 10*3/uL (ref 0.00–0.07)
Basophils Absolute: 0.1 10*3/uL (ref 0.0–0.1)
Basophils Relative: 1 %
Eosinophils Absolute: 0.3 10*3/uL (ref 0.0–0.5)
Eosinophils Relative: 3 %
HCT: 25.6 % — ABNORMAL LOW (ref 36.0–46.0)
Hemoglobin: 8 g/dL — ABNORMAL LOW (ref 12.0–15.0)
Immature Granulocytes: 0 %
Lymphocytes Relative: 25 %
Lymphs Abs: 2.2 10*3/uL (ref 0.7–4.0)
MCH: 24.3 pg — ABNORMAL LOW (ref 26.0–34.0)
MCHC: 31.3 g/dL (ref 30.0–36.0)
MCV: 77.8 fL — ABNORMAL LOW (ref 80.0–100.0)
Monocytes Absolute: 0.7 10*3/uL (ref 0.1–1.0)
Monocytes Relative: 9 %
Neutro Abs: 5.4 10*3/uL (ref 1.7–7.7)
Neutrophils Relative %: 62 %
Platelets: 316 10*3/uL (ref 150–400)
RBC: 3.29 MIL/uL — ABNORMAL LOW (ref 3.87–5.11)
RDW: 16.3 % — ABNORMAL HIGH (ref 11.5–15.5)
WBC: 8.7 10*3/uL (ref 4.0–10.5)
nRBC: 0 % (ref 0.0–0.2)

## 2019-05-24 LAB — HEMOGLOBIN A1C
Hgb A1c MFr Bld: 8.5 % — ABNORMAL HIGH (ref 4.8–5.6)
Mean Plasma Glucose: 197.25 mg/dL

## 2019-05-24 LAB — SARS CORONAVIRUS 2 (TAT 6-24 HRS): SARS Coronavirus 2: NEGATIVE

## 2019-05-24 MED ORDER — PEG 3350-KCL-NA BICARB-NACL 420 G PO SOLR
4000.0000 mL | Freq: Once | ORAL | Status: AC
  Administered 2019-05-25: 4000 mL via ORAL
  Filled 2019-05-24: qty 4000

## 2019-05-24 MED ORDER — GABAPENTIN 300 MG PO CAPS
300.0000 mg | ORAL_CAPSULE | Freq: Every day | ORAL | Status: DC
  Administered 2019-05-24 – 2019-06-03 (×11): 300 mg via ORAL
  Filled 2019-05-24 (×11): qty 1

## 2019-05-24 NOTE — H&P (Signed)
History and Physical    FREEDOM LOPEZPEREZ ZMO:[id number] DOB: 1966/04/12 DOA: 05/23/2019  PCP: [name], DO    Patient coming from: Home accompanied by daughter    Chief Complaint: Left leg pain  HPI: [name] is a 54 y.o. female with medical history significant of diabetes mellitus type 2, hypertension, hyperlipidemia, Charcot foot, schizoaffective disorders, came to emergency room accompanied by daughter because of concern for possible sepsis right lower leg cellulitis.  Patient was diagnosed with cellulitis few days ago and was started on antibiotics, Keflex. Patient had had recent emergency department visits for both cellulitis as well as concern about patient's ability to care for self.  ED Course: In the emergency room She was found to have cellulitis left leg Ultrasound negative for DVT Blood work showed she dropped her hemoglobin from 10-8 in 2 days Hemoccult test was positive for blood Patient is on aspirin and questionable Eliquis not started yet??  Review of Systems: As per HPI otherwise 10 point review of systems negative.  Except left leg pain Weakness  Past Medical History:  Diagnosis Date  . Depression   . Diabetes mellitus without complication ([place])   . Hyperlipidemia   . Hypertension   . Neuromuscular disorder ([place])   . Schizo affective schizophrenia ([place])    abilify and pazil    Past Surgical History:  Procedure Laterality Date  . CHOLECYSTECTOMY    . IRRIGATION AND DEBRIDEMENT FOOT Right 02/26/2016   Procedure: RIGHT 2ND TOE DEBRIDEMENT;  Surgeon: Samara Deist, DPM;  Location: [facility] ORS;  Service: Podiatry;  Laterality: Right;  . IRRIGATION AND DEBRIDEMENT FOOT Left 12/13/2018   Procedure: IRRIGATION AND DEBRIDEMENT FOOT;  Surgeon: Caroline More, DPM;  Location: [facility] ORS;  Service: Podiatry;  Laterality: Left;     reports that she quit smoking about 10 years ago. Her smoking use included cigarettes. She has a 90.00  pack-year smoking history. She has quit using smokeless tobacco. She reports that she does not drink alcohol or use drugs.  No Known Allergies  Family History  Problem Relation Age of Onset  . Breast cancer Cousin        maternal cousin  . Hypertension Mother   . Hyperthyroidism Mother   . Diabetes Maternal Grandmother   . Hypertension Maternal Grandmother   . Cancer Maternal Grandmother        unknown type  . Diabetes Maternal Grandfather   . Hypertension Maternal Grandfather   . Diabetes Paternal Grandmother   . Hypertension Paternal Grandmother   . Diabetes Paternal Grandfather   . Hypertension Paternal Grandfather      Prior to Admission medications   Medication Sig Start Date End Date Taking? Authorizing Provider  ARIPiprazole (ABILIFY) 10 MG tablet Take 10 mg by mouth at bedtime.    Yes [provider]  ascorbic acid (VITAMIN C) 500 MG tablet Take 500 mg by mouth daily.   Yes [provider]  aspirin EC 81 MG tablet Take 81 mg by mouth 3 (three) times a week.   Yes [provider]  atorvastatin (LIPITOR) 40 MG tablet Take 1 tablet (40 mg total) by mouth daily. 02/20/19  Yes Karamalegos, Devonne Doughty, DO  buPROPion (WELLBUTRIN XL) 150 MG 24 hr tablet Take 150 mg by mouth every morning.    Yes [provider]  cephALEXin (KEFLEX) 500 MG capsule Take 1 capsule (500 mg total) by mouth 3 (three) times daily. 05/21/19  Yes Virgel Manifold, MD  HUMALOG KWIKPEN 100 UNIT/ML  KwikPen Inject 0.05 mLs (5 Units total) into the skin 3 (three) times daily with meals. + Sliding scale Patient taking differently: Inject 12 Units into the skin See admin instructions. Inject 12 units into the skin 3 times with meals, plus 2 units for every 50 points above 150 03/23/18  Yes Karamalegos, Devonne Doughty, DO  hydrochlorothiazide (HYDRODIURIL) 12.5 MG tablet Take 1 tablet (12.5 mg total) by mouth daily. 04/29/19  Yes Karamalegos, Devonne Doughty, DO  Insulin Glargine (LANTUS  SOLOSTAR) 100 UNIT/ML Solostar Pen Inject 40 Units into the skin daily. Adjust dose as advised Patient taking differently: Inject 60 Units into the skin daily before breakfast.  12/17/18  Yes [name], MD  lisinopril (ZESTRIL) 20 MG tablet Take 1 tablet (20 mg total) by mouth daily. 04/17/19  Yes Karamalegos, Devonne Doughty, DO  metFORMIN (GLUCOPHAGE) 1000 MG tablet Take 1 tablet (1,000 mg total) by mouth 2 (two) times daily with a meal. 02/20/19  Yes Karamalegos, [name] J, DO  ondansetron (ZOFRAN) 4 MG tablet Take 1 tablet (4 mg total) by mouth every 6 (six) hours. Patient taking differently: Take 4 mg by mouth every 8 (eight) hours as needed for nausea or vomiting.  05/16/19  Yes Wurst, [place], PA-C  ELIQUIS 2.5 MG TABS tablet Take 2.5 mg by mouth 2 (two) times daily. 05/08/19   [provider]  Lancets Glory Rosebush ULTRASOFT) lancets Use to check blood sugar up to 3 x daily 03/18/19   Parks Ranger, Devonne Doughty, DO  [facility] VERIO test strip Check blood sugar up to 5 x daily as needed 04/17/19   [name], DO    Physical Exam: Vitals:   05/23/19 1316 05/23/19 1318 05/23/19 2315  BP: 135/78  (!) 142/73  Pulse: 99  100  Resp: 20  13  Temp: 98.4 F (36.9 C)    SpO2: 99%  94%  Weight:  120.2 kg   Height:  5\' 10"  (1.778 m)     Constitutional: NAD, calm, comfortable Vitals:   05/23/19 1316 05/23/19 1318 05/23/19 2315  BP: 135/78  (!) 142/73  Pulse: 99  100  Resp: 20  13  Temp: 98.4 F (36.9 C)    SpO2: 99%  94%  Weight:  120.2 kg   Height:  5\' 10"  (1.778 m)    Eyes: PERRL, lids and conjunctivae normal ENMT: Mucous membranes are moist. Posterior pharynx clear of any exudate or lesions.Normal dentition.  Neck: normal, supple, no masses, no thyromegaly Respiratory: clear to auscultation bilaterally, no wheezing, no crackles. Normal respiratory effort. No accessory muscle use.  Cardiovascular: Regular rate and rhythm, no murmurs / rubs / gallops. No extremity  edema. 2+ pedal pulses. No carotid bruits.  Abdomen: no tenderness, no masses palpated. No hepatosplenomegaly. Bowel sounds positive.  Musculoskeletal: no clubbing / cyanosis. No joint deformity upper and lower extremities. Good ROM, no contractures. Normal muscle tone.  Skin: Erythema increased temperature left leg Neurologic: CN 2-12 grossly intact. Sensation intact, DTR normal. Strength 5/5 in all 4.  Psychiatric: Normal judgment and insight. Alert and oriented x 3. Normal mood.    Labs on Admission: I have personally reviewed following labs and imaging studies  CBC: Recent Labs  Lab 05/21/19 1557 05/22/19 1545 05/23/19 1330  WBC 8.3 7.6 8.5  NEUTROABS 6.4  --   --   HGB 10.2* 8.8* 8.2*  HCT 33.2* 28.7* 26.5*  MCV 79.0* 79.9* 77.5*  PLT 364 337 932   Basic Metabolic Panel: Recent Labs  Lab 05/21/19 1557 05/22/19  1545 05/23/19 1330  NA 126* 127* 129*  K 4.9 4.3 4.4  CL 92* 91* 95*  CO2 22 23 24   GLUCOSE 133* 224* 161*  BUN 16 14 16   CREATININE 1.35* 1.30* 1.44*  CALCIUM 9.4 9.1 9.0   GFR: Estimated Creatinine Clearance: 62.2 mL/min (A) (by C-G formula based on SCr of 1.44 mg/dL (H)). Liver Function Tests: Recent Labs  Lab 05/21/19 1557 05/22/19 1545  AST 28 25  ALT 39 34  ALKPHOS 114 102  BILITOT 0.5 0.5  PROT 7.6 6.6  ALBUMIN 3.7 3.2*   No results for input(s): LIPASE, AMYLASE in the last 168 hours. No results for input(s): AMMONIA in the last 168 hours. Coagulation Profile: No results for input(s): INR, PROTIME in the last 168 hours. Cardiac Enzymes: No results for input(s): CKTOTAL, CKMB, CKMBINDEX, TROPONINI in the last 168 hours. BNP (last 3 results) No results for input(s): PROBNP in the last 8760 hours. HbA1C: No results for input(s): HGBA1C in the last 72 hours. CBG: No results for input(s): GLUCAP in the last 168 hours. Lipid Profile: No results for input(s): CHOL, HDL, LDLCALC, TRIG, CHOLHDL, LDLDIRECT in the last 72 hours. Thyroid  Function Tests: No results for input(s): TSH, T4TOTAL, FREET4, T3FREE, THYROIDAB in the last 72 hours. Anemia Panel: Recent Labs    05/21/19 1557  FERRITIN 46   Urine analysis:    Component Value Date/Time   COLORURINE YELLOW 05/21/2019 2334   APPEARANCEUR HAZY (A) 05/21/2019 2334   APPEARANCEUR Clear 08/19/2012 1204   LABSPEC 1.027 05/21/2019 2334   LABSPEC 1.015 08/19/2012 1204   PHURINE 5.0 05/21/2019 2334   GLUCOSEU NEGATIVE 05/21/2019 2334   GLUCOSEU >=500 08/19/2012 1204   HGBUR NEGATIVE 05/21/2019 2334   BILIRUBINUR NEGATIVE 05/21/2019 2334   BILIRUBINUR Negative 12/13/2017 1533   BILIRUBINUR Negative 08/19/2012 [place] 05/21/2019 2334   PROTEINUR NEGATIVE 05/21/2019 2334   UROBILINOGEN 0.2 12/13/2017 1533   NITRITE NEGATIVE 05/21/2019 2334   LEUKOCYTESUR MODERATE (A) 05/21/2019 2334   LEUKOCYTESUR Trace 08/19/2012 1204    Radiological Exams on Admission: US Venous Img Lower Unilateral Right (DVT)  Result Date: 05/23/2019 CLINICAL DATA:  Leg swelling.  Pain. EXAM: RIGHT LOWER EXTREMITY VENOUS DOPPLER ULTRASOUND TECHNIQUE: Gray-scale sonography with graded compression, as well as color Doppler and duplex ultrasound were performed to evaluate the lower extremity deep venous systems from the level of the common femoral vein and including the common femoral, femoral, profunda femoral, popliteal and calf veins including the posterior tibial, peroneal and gastrocnemius veins when visible. The superficial great saphenous vein was also interrogated. Spectral Doppler was utilized to evaluate flow at rest and with distal augmentation maneuvers in the common femoral, femoral and popliteal veins. COMPARISON:  None. FINDINGS: Contralateral Common Femoral Vein: Respiratory phasicity is normal and symmetric with the symptomatic side. No evidence of thrombus. Normal compressibility. Common Femoral Vein: No evidence of thrombus. Normal compressibility, respiratory phasicity  and response to augmentation. Saphenofemoral Junction: No evidence of thrombus. Normal compressibility and flow on color Doppler imaging. Profunda Femoral Vein: No evidence of thrombus. Normal compressibility and flow on color Doppler imaging. Femoral Vein: No evidence of thrombus. Normal compressibility, respiratory phasicity and response to augmentation. Popliteal Vein: No evidence of thrombus. Normal compressibility, respiratory phasicity and response to augmentation. Calf Veins: Evaluation of the peroneal vein was limited by suboptimal compression. There is no obvious thrombus involving the tibial veins. Superficial Great Saphenous Vein: No evidence of thrombus. Normal compressibility. Venous Reflux:  None. Other  Findings:  None. IMPRESSION: No evidence of deep venous thrombosis. Electronically Signed   By: Constance Holster M.D.   On: 05/23/2019 21:05    EKG: Independently reviewed.   Assessment/Plan Active Problems:   DM (diabetes mellitus), type 2, 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HCC)   Polyneuropathy   Essential hypertension   Schizoaffective disorder (HCC)   Hammer toe of second toe of right foot   Hyperlipidemia associated with type 2 diabetes mellitus (HCC)   Neuropathic foot ulcer, left, with fat layer exposed ([place])   Cellulitis in diabetic foot (HCC)   Hyponatremia   GI bleed   Cellulitis   Assessment plan  Cellulitis right leg Erythema and pain in the right leg Venous Doppler negative for DVT Plan Rocephin IV if does not get better we will add vancomycin patient history of diabetes Charcot foot   GI bleed Patient dropped hemoglobin from 10 to 8 Hemoccult positive in the emergency room Is listed that patient might take Eliquis or supposed to started I could not find the reason  Plan continue with Protonix IV drips, hold aspirin We will continue with antidiabetic diet/GI consult in the morning To schedule upper endoscopy-colonoscopy  Hyponatremia Secondary to  mild dehydration IV fluids  Diabetes mellitus type 2 with insulin-dependent long-term with neuropathy and Charcot foot Resume Lantus insulin sliding scale per sensitivity factor  Essential hypertension Resume Zestril  Hypercholesterolemia Resume Lipitor  GERD PPI  Chronic kidney disease stage IIIa Plan avoid nephrotoxins follow BUN and creatinine  Schizophrenia Wellbutrin /Abilify   DVT prophylaxis: SCD Code Status: Full code Family Communication: Emergency room physician contacted the family Disposition Plan: Admission monitor bed Consults called: Not yet needs to be called in the morning GI Admission status: Full admission   Gabriela Waage G Sebastyan Snodgrass MD [facility]  If 7PM-7AM, please contact night-coverage [email or web address]   05/24/2019, 12:04 AM

## 2019-05-24 NOTE — Progress Notes (Signed)
Patients CBG is 120, sent message to Dr. Ilona Sorrel, V.O to hold Novolog 12 units, will administer Lantus as ordered

## 2019-05-24 NOTE — Progress Notes (Signed)
Pharmacy sent 55 units of lantus , unable to give d/t ordered 60 units, awaiting 5 additional units to be correct amount, verified with Pharmacy and witness , Margaretann Loveless, RN

## 2019-05-24 NOTE — H&P (View-Only) (Signed)
GI Inpatient Consult Note  Reason for Consult: GI bleed, Acute-on-chronic anemia   Attending Requesting Consult: Dr. Mal Misty   History of Present Illness: [name] is a 54 y.o. female with a known history of hypertension, hyperlipidemia, diabetes type 2 with Charcot foot and polyneuropathy, and schizoaffective disorder who presented to the [facility] ED with concerns of right leg cellulitis.  During ED evaluation, Hgb was found to decrease from 10.2 on 05/21/19 to 8.2 on 8.8 on 05/22/19. Stool was hemoccult + on DRE. This morning, Hgb returned at 8.0. Further chart review indicates Hgb has trended down over the last several years, initially dropping from 14 > 11 in 01/2016, maintaining baseline 11-12 through 2018-2019, then decreasing to 9-10 in 2020. GI consultation was placed due to acute-on-chronic anemia and heme positive stool.  [name] daughter, [name], was present over speaker phone during patient's interview, and contributed significantly to the history.  [name] and her daughter report she has been experiencing persistent nausea and bloating for the last 7 to 10 days.  Symptoms come and go throughout the day, but are worse after eating.  [name] states her mother has only eaten 3 regular meals over this time due to the symptoms, but has eaten smaller snacks and drink liquids as tolerated.  [name] has not been giving her as much insulin, also may not at all, and her sugars have remained around 100.  She has also experienced increased acid reflux during this time, which is usually relieved by Tums.  She notes getting choked when swallowing liquids at times, denies solid food dysphagia, regurgitation, vomiting, or melena.  [name] also reports mostly loose stools with occasional watery BMs over this time.  She is not sure if diarrhea started when nausea and bloating began, or if it was already ongoing.  She has woken up during the night a few times to have a BM.  She denies  vomiting, alternating constipation, or frank hematochezia. She has been prescribed antibiotics intermittently for bilateral foot wounds that have not been healing well - mostly recently Keflex. No well water use, sick contacts, or recent travel. She denies NSAID use, alcohol use, or tobacco use.  [name]  was prescribed Eliqus by podiatry on 05/08/19, but did not start taking it due to upcoming for [facility] for evaluation of chest pain. She currently denies chest pain, but notes mild shortness of breath. She has chronic fatigue, which is at baseline. No dizziness or lightheadedness.  Past Medical History:  Past Medical History:  Diagnosis Date  . Depression   . Diabetes mellitus without complication ([place])   . Hyperlipidemia   . Hypertension   . Neuromuscular disorder ([place])   . Schizo affective schizophrenia ([place])    abilify and pazil    Problem List: Patient Active Problem List   Diagnosis Date Noted  . Anemia   . Cellulitis in diabetic foot ([place]) 05/23/2019  . Hyponatremia 05/23/2019  . GI bleed 05/23/2019  . Cellulitis 05/23/2019  . Neuropathic foot ulcer, left, with fat layer exposed ([place]) 02/05/2019  . Hyperkalemia 02/05/2019  . Osteomyelitis ([place]) 12/11/2018  . Pyelonephritis 12/14/2017  . Morbid obesity ([place]) 05/10/2017  . Long-term use of high-risk medication 05/10/2017  . Hyperlipidemia associated with type 2 diabetes mellitus ([place]) 06/27/2016  . Hammer toe of second toe of right foot 04/20/2016  . Essential hypertension 06/29/2015  . Schizoaffective disorder ([place]) 06/29/2015  . DM (diabetes mellitus), type 2, 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[place]) 40/98/1191  . Polyneuropathy 04/02/2014  Past Surgical History: Past Surgical History:  Procedure Laterality Date  . CHOLECYSTECTOMY    . IRRIGATION AND DEBRIDEMENT FOOT Right 02/26/2016   Procedure: RIGHT 2ND TOE DEBRIDEMENT;  Surgeon: Samara Deist, DPM;  Location: [facility] ORS;  Service: Podiatry;  Laterality:  Right;  . IRRIGATION AND DEBRIDEMENT FOOT Left 12/13/2018   Procedure: IRRIGATION AND DEBRIDEMENT FOOT;  Surgeon: Caroline More, DPM;  Location: [facility] ORS;  Service: Podiatry;  Laterality: Left;    Allergies: No Known Allergies  Home Medications: Medications Prior to Admission  Medication Sig Dispense Refill Last Dose  . ARIPiprazole (ABILIFY) 10 MG tablet Take 10 mg by mouth at bedtime.    05/22/2019 at 2000  . ascorbic acid (VITAMIN C) 500 MG tablet Take 500 mg by mouth daily.   05/23/2019 at 1000  . aspirin EC 81 MG tablet Take 81 mg by mouth 3 (three) times a week.   Past Week at Unknown time  . atorvastatin (LIPITOR) 40 MG tablet Take 1 tablet (40 mg total) by mouth daily. 90 tablet 3 05/23/2019 at 1000  . buPROPion (WELLBUTRIN XL) 150 MG 24 hr tablet Take 150 mg by mouth every morning.    05/23/2019 at 1000  . cephALEXin (KEFLEX) 500 MG capsule Take 1 capsule (500 mg total) by mouth 3 (three) times daily. 21 capsule 0 05/23/2019 at 1000  . HUMALOG KWIKPEN 100 UNIT/ML KwikPen Inject 0.05 mLs (5 Units total) into the skin 3 (three) times daily with meals. + Sliding scale (Patient taking differently: Inject 12 Units into the skin See admin instructions. Inject 12 units into the skin 3 times with meals, plus 2 units for every 50 points above 150) 15 mL 5 Past Week at Unknown time  . hydrochlorothiazide (HYDRODIURIL) 12.5 MG tablet Take 1 tablet (12.5 mg total) by mouth daily. 30 tablet 2 05/23/2019 at 1000  . Insulin Glargine (LANTUS SOLOSTAR) 100 UNIT/ML Solostar Pen Inject 40 Units into the skin daily. Adjust dose as advised (Patient taking differently: Inject 60 Units into the skin daily before breakfast. ) 15 mL 1 05/23/2019 at 1000  . lisinopril (ZESTRIL) 20 MG tablet Take 1 tablet (20 mg total) by mouth daily. 90 tablet 3 05/23/2019 at 1000  . metFORMIN (GLUCOPHAGE) 1000 MG tablet Take 1 tablet (1,000 mg total) by mouth 2 (two) times daily with a meal. 180 tablet 3 05/23/2019 at 1000  . ondansetron  (ZOFRAN) 4 MG tablet Take 1 tablet (4 mg total) by mouth every 6 (six) hours. (Patient taking differently: Take 4 mg by mouth every 8 (eight) hours as needed for nausea or vomiting. ) 12 tablet 0 05/23/2019 at 1100  . ELIQUIS 2.5 MG TABS tablet Take 2.5 mg by mouth 2 (two) times daily.     . Lancets (ONETOUCH ULTRASOFT) lancets Use to check blood sugar up to 3 x daily 100 each 12   . ONETOUCH VERIO test strip Check blood sugar up to 5 x daily as needed 450 each 3    Home medication reconciliation was completed with the patient.   Scheduled Inpatient Medications:   . ARIPiprazole  10 mg Oral QHS  . atorvastatin  40 mg Oral Daily  . buPROPion  150 mg Oral q morning - 10a  . insulin aspart  0-15 Units Subcutaneous TID WC  . insulin aspart  12 Units Subcutaneous TID WC  . insulin glargine  60 Units Subcutaneous QAC breakfast  . lisinopril  20 mg Oral Daily  . [START ON 05/27/2019] pantoprazole  40  mg Intravenous Q12H    Continuous Inpatient Infusions:   . sodium chloride 75 mL/hr at 05/23/19 2302  . cefTRIAXone (ROCEPHIN)  IV Stopped (05/24/19 0151)  . pantoprozole (PROTONIX) infusion 8 mg/hr (05/24/19 1000)    PRN Inpatient Medications:  ondansetron **OR** ondansetron (ZOFRAN) IV  Family History: family history includes Breast cancer in her cousin; Cancer in her maternal grandmother; Diabetes in her maternal grandfather, maternal grandmother, paternal grandfather, and paternal grandmother; Hypertension in her maternal grandfather, maternal grandmother, mother, paternal grandfather, and paternal grandmother; Hyperthyroidism in her mother.  The patient's family history is negative for inflammatory bowel disorders, GI malignancy, or solid organ transplantation.  Social History:   reports that she quit smoking about 10 years ago. Her smoking use included cigarettes. She has a 90.00 pack-year smoking history. She has quit using smokeless tobacco. She reports that she does not drink alcohol or  use drugs. The patient denies ETOH, tobacco, or drug use.   Review of Systems: Constitutional: Weight is stable.  Eyes: No changes in vision. ENT: No oral lesions, sore throat.  GI: see HPI.  Heme/Lymph: No easy bruising.  CV: No chest pain.  GU: No hematuria.  Integumentary: No rashes.  Neuro: No headaches.  Psych: No depression/anxiety.  Endocrine: No heat/cold intolerance.  Allergic/Immunologic: No urticaria.  Resp: No cough, SOB.  Musculoskeletal: No joint swelling.    Physical Examination: BP 140/75 (BP Location: Left Arm)   Pulse (!) 106   Temp 97.7 F (36.5 C) (Oral)   Resp 17   Ht 5\' 10"  (1.778 m)   Wt 119.5 kg   SpO2 100%   BMI 37.81 kg/m  Gen: NAD, alert and oriented x 4 HEENT: PEERLA, EOMI, Neck: supple, no JVD or thyromegaly Chest: CTA bilaterally, no wheezes, crackles, or other adventitious sounds CV: RRR, no m/g/c/r Abd: soft, NT, ND, +BS in all four quadrants; no HSM, guarding, ridigity, or rebound tenderness Ext: no edema, well perfused with 2+ pulses, Skin: no rash or lesions noted Lymph: no LAD  Data: Lab Results  Component Value Date   WBC 8.7 05/24/2019   HGB 8.0 (L) 05/24/2019   HCT 25.6 (L) 05/24/2019   MCV 77.8 (L) 05/24/2019   PLT 316 05/24/2019   Recent Labs  Lab 05/22/19 1545 05/23/19 1330 05/24/19 0427  HGB 8.8* 8.2* 8.0*   Lab Results  Component Value Date   NA 129 (L) 05/23/2019   K 4.4 05/23/2019   CL 95 (L) 05/23/2019   CO2 24 05/23/2019   BUN 16 05/23/2019   CREATININE 1.44 (H) 05/23/2019   Lab Results  Component Value Date   ALT 34 05/22/2019   AST 25 05/22/2019   ALKPHOS 102 05/22/2019   BILITOT 0.5 05/22/2019   No results for input(s): APTT, INR, PTT in the last 168 hours.   Assessment/Plan: Ms. Tomaselli is a 54 y.o. female with a known history of hypertension, hyperlipidemia, diabetes type 2 with Charcot foot and polyneuropathy, and schizoaffective disorder admitted for R-leg cellulitis and found to have  acute-on-chronic anemia with heme positive stool. She   1. Acute-on-chronic anemia - Hgb has declined over the last several years from 11-12 > 9-10 in 2020, now 8. Consider a component of chronic disease given multiple comorbidities. 2. Heme positive stool - DDx includes occur upper GI bleeding from peptic etiologies given GERD, nausea, and bloating symptoms. However, could also be secondary to anal-outlet bleeding from diarrhea. Denies NSAIDs/blood thinners. No prior endoscopic evaluations.   3. Nausea - No  vomiting or pain. Consider upper GI pathologies given #1-2. 4. Bloating - As above. 5. Diarrhea - Infectious diarrhea considered in the setting of recent antibiotics. May also be functional due to uncontrolled diabetes, polypharmacy, diet triggers, etc.  Recommendations: 1. Case reviewed with Dr. Alice Reichert. Recommend EGD and colonoscopy on Sunday, 05/26/19. Orders placed for clear liquid diet starting tomorrow, Saturday, 1/23. Orders for Trilyte bowel prep placed. 2. GI PCR and C diff testing placed. Pt advised to notify nursing staff prior to BM. 3. Continue IV pantoprazole 40 mg q 12 hrs. 4. Continue to monitor serial Hgb - transfuse <7.  Thank you for the consult. We will follow along with you. Please call with questions or concerns.  Lavera Guise, PA-C [facility] Phone: [phone] Pager: [phone]

## 2019-05-24 NOTE — ED Notes (Signed)
Answered pt's call bell, pt states she thinks she may have had BM on herself. On inspection, no BM but pt assisted to urinate on bedside commode. PT tolerated well.

## 2019-05-24 NOTE — Consult Note (Signed)
GI Inpatient Consult Note  Reason for Consult: GI bleed, Acute-on-chronic anemia   Attending Requesting Consult: Dr. Mal Misty   History of Present Illness: [name] is a 54 y.o. female with a known history of hypertension, hyperlipidemia, diabetes type 2 with Charcot foot and polyneuropathy, and schizoaffective disorder who presented to the [facility] ED with concerns of right leg cellulitis.  During ED evaluation, Hgb was found to decrease from 10.2 on 05/21/19 to 8.2 on 8.8 on 05/22/19. Stool was hemoccult + on DRE. This morning, Hgb returned at 8.0. Further chart review indicates Hgb has trended down over the last several years, initially dropping from 14 > 11 in 01/2016, maintaining baseline 11-12 through 2018-2019, then decreasing to 9-10 in 2020. GI consultation was placed due to acute-on-chronic anemia and heme positive stool.  [name] daughter, [name], was present over speaker phone during patient's interview, and contributed significantly to the history.  [name] and her daughter report she has been experiencing persistent nausea and bloating for the last 7 to 10 days.  Symptoms come and go throughout the day, but are worse after eating.  [name] states her mother has only eaten 3 regular meals over this time due to the symptoms, but has eaten smaller snacks and drink liquids as tolerated.  [name] has not been giving her as much insulin, also may not at all, and her sugars have remained around 100.  She has also experienced increased acid reflux during this time, which is usually relieved by Tums.  She notes getting choked when swallowing liquids at times, denies solid food dysphagia, regurgitation, vomiting, or melena.  [name] also reports mostly loose stools with occasional watery BMs over this time.  She is not sure if diarrhea started when nausea and bloating began, or if it was already ongoing.  She has woken up during the night a few times to have a BM.  She denies  vomiting, alternating constipation, or frank hematochezia. She has been prescribed antibiotics intermittently for bilateral foot wounds that have not been healing well - mostly recently Keflex. No well water use, sick contacts, or recent travel. She denies NSAID use, alcohol use, or tobacco use.  [name]  was prescribed Eliqus by podiatry on 05/08/19, but did not start taking it due to upcoming for [facility] for evaluation of chest pain. She currently denies chest pain, but notes mild shortness of breath. She has chronic fatigue, which is at baseline. No dizziness or lightheadedness.  Past Medical History:  Past Medical History:  Diagnosis Date  . Depression   . Diabetes mellitus without complication ([place])   . Hyperlipidemia   . Hypertension   . Neuromuscular disorder ([place])   . Schizo affective schizophrenia ([place])    abilify and pazil    Problem List: Patient Active Problem List   Diagnosis Date Noted  . Anemia   . Cellulitis in diabetic foot ([place]) 05/23/2019  . Hyponatremia 05/23/2019  . GI bleed 05/23/2019  . Cellulitis 05/23/2019  . Neuropathic foot ulcer, left, with fat layer exposed ([place]) 02/05/2019  . Hyperkalemia 02/05/2019  . Osteomyelitis ([place]) 12/11/2018  . Pyelonephritis 12/14/2017  . Morbid obesity ([place]) 05/10/2017  . Long-term use of high-risk medication 05/10/2017  . Hyperlipidemia associated with type 2 diabetes mellitus ([place]) 06/27/2016  . Hammer toe of second toe of right foot 04/20/2016  . Essential hypertension 06/29/2015  . Schizoaffective disorder ([place]) 06/29/2015  . DM (diabetes mellitus), type 2, 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[place]) 37/01/6268  . Polyneuropathy 04/02/2014  Past Surgical History: Past Surgical History:  Procedure Laterality Date  . CHOLECYSTECTOMY    . IRRIGATION AND DEBRIDEMENT FOOT Right 02/26/2016   Procedure: RIGHT 2ND TOE DEBRIDEMENT;  Surgeon: Samara Deist, DPM;  Location: [facility] ORS;  Service: Podiatry;  Laterality:  Right;  . IRRIGATION AND DEBRIDEMENT FOOT Left 12/13/2018   Procedure: IRRIGATION AND DEBRIDEMENT FOOT;  Surgeon: Caroline More, DPM;  Location: [facility] ORS;  Service: Podiatry;  Laterality: Left;    Allergies: No Known Allergies  Home Medications: Medications Prior to Admission  Medication Sig Dispense Refill Last Dose  . ARIPiprazole (ABILIFY) 10 MG tablet Take 10 mg by mouth at bedtime.    05/22/2019 at 2000  . ascorbic acid (VITAMIN C) 500 MG tablet Take 500 mg by mouth daily.   05/23/2019 at 1000  . aspirin EC 81 MG tablet Take 81 mg by mouth 3 (three) times a week.   Past Week at Unknown time  . atorvastatin (LIPITOR) 40 MG tablet Take 1 tablet (40 mg total) by mouth daily. 90 tablet 3 05/23/2019 at 1000  . buPROPion (WELLBUTRIN XL) 150 MG 24 hr tablet Take 150 mg by mouth every morning.    05/23/2019 at 1000  . cephALEXin (KEFLEX) 500 MG capsule Take 1 capsule (500 mg total) by mouth 3 (three) times daily. 21 capsule 0 05/23/2019 at 1000  . HUMALOG KWIKPEN 100 UNIT/ML KwikPen Inject 0.05 mLs (5 Units total) into the skin 3 (three) times daily with meals. + Sliding scale (Patient taking differently: Inject 12 Units into the skin See admin instructions. Inject 12 units into the skin 3 times with meals, plus 2 units for every 50 points above 150) 15 mL 5 Past Week at Unknown time  . hydrochlorothiazide (HYDRODIURIL) 12.5 MG tablet Take 1 tablet (12.5 mg total) by mouth daily. 30 tablet 2 05/23/2019 at 1000  . Insulin Glargine (LANTUS SOLOSTAR) 100 UNIT/ML Solostar Pen Inject 40 Units into the skin daily. Adjust dose as advised (Patient taking differently: Inject 60 Units into the skin daily before breakfast. ) 15 mL 1 05/23/2019 at 1000  . lisinopril (ZESTRIL) 20 MG tablet Take 1 tablet (20 mg total) by mouth daily. 90 tablet 3 05/23/2019 at 1000  . metFORMIN (GLUCOPHAGE) 1000 MG tablet Take 1 tablet (1,000 mg total) by mouth 2 (two) times daily with a meal. 180 tablet 3 05/23/2019 at 1000  . ondansetron  (ZOFRAN) 4 MG tablet Take 1 tablet (4 mg total) by mouth every 6 (six) hours. (Patient taking differently: Take 4 mg by mouth every 8 (eight) hours as needed for nausea or vomiting. ) 12 tablet 0 05/23/2019 at 1100  . ELIQUIS 2.5 MG TABS tablet Take 2.5 mg by mouth 2 (two) times daily.     . Lancets (ONETOUCH ULTRASOFT) lancets Use to check blood sugar up to 3 x daily 100 each 12   . ONETOUCH VERIO test strip Check blood sugar up to 5 x daily as needed 450 each 3    Home medication reconciliation was completed with the patient.   Scheduled Inpatient Medications:   . ARIPiprazole  10 mg Oral QHS  . atorvastatin  40 mg Oral Daily  . buPROPion  150 mg Oral q morning - 10a  . insulin aspart  0-15 Units Subcutaneous TID WC  . insulin aspart  12 Units Subcutaneous TID WC  . insulin glargine  60 Units Subcutaneous QAC breakfast  . lisinopril  20 mg Oral Daily  . [START ON 05/27/2019] pantoprazole  40  mg Intravenous Q12H    Continuous Inpatient Infusions:   . sodium chloride 75 mL/hr at 05/23/19 2302  . cefTRIAXone (ROCEPHIN)  IV Stopped (05/24/19 0151)  . pantoprozole (PROTONIX) infusion 8 mg/hr (05/24/19 1000)    PRN Inpatient Medications:  ondansetron **OR** ondansetron (ZOFRAN) IV  Family History: family history includes Breast cancer in her cousin; Cancer in her maternal grandmother; Diabetes in her maternal grandfather, maternal grandmother, paternal grandfather, and paternal grandmother; Hypertension in her maternal grandfather, maternal grandmother, mother, paternal grandfather, and paternal grandmother; Hyperthyroidism in her mother.  The patient's family history is negative for inflammatory bowel disorders, GI malignancy, or solid organ transplantation.  Social History:   reports that she quit smoking about 10 years ago. Her smoking use included cigarettes. She has a 90.00 pack-year smoking history. She has quit using smokeless tobacco. She reports that she does not drink alcohol or  use drugs. The patient denies ETOH, tobacco, or drug use.   Review of Systems: Constitutional: Weight is stable.  Eyes: No changes in vision. ENT: No oral lesions, sore throat.  GI: see HPI.  Heme/Lymph: No easy bruising.  CV: No chest pain.  GU: No hematuria.  Integumentary: No rashes.  Neuro: No headaches.  Psych: No depression/anxiety.  Endocrine: No heat/cold intolerance.  Allergic/Immunologic: No urticaria.  Resp: No cough, SOB.  Musculoskeletal: No joint swelling.    Physical Examination: BP 140/75 (BP Location: Left Arm)   Pulse (!) 106   Temp 97.7 F (36.5 C) (Oral)   Resp 17   Ht 5\' 10"  (1.778 m)   Wt 119.5 kg   SpO2 100%   BMI 37.81 kg/m  Gen: NAD, alert and oriented x 4 HEENT: PEERLA, EOMI, Neck: supple, no JVD or thyromegaly Chest: CTA bilaterally, no wheezes, crackles, or other adventitious sounds CV: RRR, no m/g/c/r Abd: soft, NT, ND, +BS in all four quadrants; no HSM, guarding, ridigity, or rebound tenderness Ext: no edema, well perfused with 2+ pulses, Skin: no rash or lesions noted Lymph: no LAD  Data: Lab Results  Component Value Date   WBC 8.7 05/24/2019   HGB 8.0 (L) 05/24/2019   HCT 25.6 (L) 05/24/2019   MCV 77.8 (L) 05/24/2019   PLT 316 05/24/2019   Recent Labs  Lab 05/22/19 1545 05/23/19 1330 05/24/19 0427  HGB 8.8* 8.2* 8.0*   Lab Results  Component Value Date   NA 129 (L) 05/23/2019   K 4.4 05/23/2019   CL 95 (L) 05/23/2019   CO2 24 05/23/2019   BUN 16 05/23/2019   CREATININE 1.44 (H) 05/23/2019   Lab Results  Component Value Date   ALT 34 05/22/2019   AST 25 05/22/2019   ALKPHOS 102 05/22/2019   BILITOT 0.5 05/22/2019   No results for input(s): APTT, INR, PTT in the last 168 hours.   Assessment/Plan: [name] is a 53 y.o. female with a known history of hypertension, hyperlipidemia, diabetes type 2 with Charcot foot and polyneuropathy, and schizoaffective disorder admitted for R-leg cellulitis and found to have  acute-on-chronic anemia with heme positive stool. She   1. Acute-on-chronic anemia - Hgb has declined over the last several years from 11-12 > 9-10 in 2020, now 8. Consider a component of chronic disease given multiple comorbidities. 2. Heme positive stool - DDx includes occur upper GI bleeding from peptic etiologies given GERD, nausea, and bloating symptoms. However, could also be secondary to anal-outlet bleeding from diarrhea. Denies NSAIDs/blood thinners. No prior endoscopic evaluations.   3. Nausea - No  vomiting or pain. Consider upper GI pathologies given #1-2. 4. Bloating - As above. 5. Diarrhea - Infectious diarrhea considered in the setting of recent antibiotics. May also be functional due to uncontrolled diabetes, polypharmacy, diet triggers, etc.  Recommendations: 1. Case reviewed with Dr. Alice Reichert. Recommend EGD and colonoscopy on Sunday, 05/26/19. Orders placed for clear liquid diet starting tomorrow, Saturday, 1/23. Orders for Trilyte bowel prep placed. 2. GI PCR and C diff testing placed. Pt advised to notify nursing staff prior to BM. 3. Continue IV pantoprazole 40 mg q 12 hrs. 4. Continue to monitor serial Hgb - transfuse <7.  Thank you for the consult. We will follow along with you. Please call with questions or concerns.  Lavera Guise, PA-C [facility] Phone: [phone] Pager: [phone]

## 2019-05-24 NOTE — Progress Notes (Addendum)
Progress Note    [name]  FOY:[id number] DOB: [birth year]  DOA: 05/23/2019 PCP: [name], DO      Brief Narrative:    Medical records reviewed and are as summarized below:  [name] is an 54 y.o. female with medical history significant of diabetes mellitus type 2, hypertension, hyperlipidemia, Charcot foot, schizoaffective disorders, came to emergency room accompanied by daughter because of concern for possible sepsis right lower leg cellulitis.  Patient was diagnosed with cellulitis few days ago and was started on antibiotics, Keflex. Patient had had recent emergency department visits for both cellulitis as well as concern about patient's ability to care for self.      Assessment/Plan:   Active Problems:   DM (diabetes mellitus), type 2, 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HCC)   Polyneuropathy   Essential hypertension   Schizoaffective disorder (HCC)   Hammer toe of second toe of right foot   Hyperlipidemia associated with type 2 diabetes mellitus ([place])   Neuropathic foot ulcer, left, with fat layer exposed ([place])   Cellulitis in diabetic foot (HCC)   Hyponatremia   GI bleed   Cellulitis   Body mass index is 37.81 kg/m.  (Morbid obesity)  Leg cellulitis: Improving.  Continue IV antibiotics.  Acute on chronic anemia, likely due to GI bleed: Monitor H&H.  No indication for blood transfusion at this time.  Positive heme stools/occult GI bleeding: Consulted gastroenterologist for further evaluation.  Plan for EGD and colonoscopy on Sunday, 05/26/2019 continue IV Protonix.  Insulin-dependent diabetes mellitus with peripheral neuropathy: continue Lantus.  Start gabapentin.  Hypertension: Continue antihypertensives  Hyponatremia: This is chronic.  Asymptomatic.  Schizoaffective disorder: Continue psychotropics.   Family Communication/Anticipated D/C date and plan/Code Status   DVT prophylaxis: SCDs Code Status: Full  code Family Communication: Plan discussed with the patient Disposition Plan: To be determined      Subjective:   She says redness, pain and warmth on the right leg has improved.  She has no complaints of numbness and loss of sensation in bilateral legs it has been going on for months.  She said she had taken gabapentin in the past.  No vomiting, hematemesis, black or bloody stools, abdominal pain.  She said she has been taking 2 tablets of 325 mg strength aspirin for headaches in the past few weeks though she does not take it every day.  Objective:    Vitals:   05/24/19 0215 05/24/19 0258 05/24/19 0802 05/24/19 1605  BP: (!) 142/79  140/75 (!) 146/84  Pulse: (!) 106  (!) 106 93  Resp: 18  17 17   Temp: 98.6 F (37 C)  97.7 F (36.5 C) (!) 97.5 F (36.4 C)  TempSrc:  Oral Oral Oral  SpO2: 99%  100% 100%  Weight: 119.5 kg     Height: 5\' 10"  (1.778 m)       Intake/Output Summary (Last 24 hours) at 05/24/2019 1625 Last data filed at 05/24/2019 1000 Gross per 24 hour  Intake 1076.21 ml  Output --  Net 1076.21 ml   Filed Weights   05/23/19 1318 05/24/19 0215  Weight: 120.2 kg 119.5 kg    Exam:  GEN: NAD SKIN: No rash EYES: EOMI ENT: MMM CV: RRR PULM: CTA B ABD: soft, ND, NT, +BS CNS: AAO x 3, non focal EXT: Redness, swelling, warmth and tenderness on the right leg has improved (according to the patient and these were not evident during my exam.)  Deformity of left foot/Charcot  joint.   Data Reviewed:   I have personally reviewed following labs and imaging studies:  Labs: Labs show the following:   Basic Metabolic Panel: Recent Labs  Lab 05/21/19 1557 05/21/19 1557 05/22/19 1545 05/23/19 1330  NA 126*  --  127* 129*  K 4.9   < > 4.3 4.4  CL 92*  --  91* 95*  CO2 22  --  23 24  GLUCOSE 133*  --  224* 161*  BUN 16  --  14 16  CREATININE 1.35*  --  1.30* 1.44*  CALCIUM 9.4  --  9.1 9.0   < > = values in this interval not displayed.   GFR Estimated  Creatinine Clearance: 62 mL/min (A) (by C-G formula based on SCr of 1.44 mg/dL (H)). Liver Function Tests: Recent Labs  Lab 05/21/19 1557 05/22/19 1545  AST 28 25  ALT 39 34  ALKPHOS 114 102  BILITOT 0.5 0.5  PROT 7.6 6.6  ALBUMIN 3.7 3.2*   No results for input(s): LIPASE, AMYLASE in the last 168 hours. No results for input(s): AMMONIA in the last 168 hours. Coagulation profile No results for input(s): INR, PROTIME in the last 168 hours.  CBC: Recent Labs  Lab 05/21/19 1557 05/22/19 1545 05/23/19 1330 05/24/19 0427  WBC 8.3 7.6 8.5 8.7  NEUTROABS 6.4  --   --  5.4  HGB 10.2* 8.8* 8.2* 8.0*  HCT 33.2* 28.7* 26.5* 25.6*  MCV 79.0* 79.9* 77.5* 77.8*  PLT 364 337 309 316   Cardiac Enzymes: No results for input(s): CKTOTAL, CKMB, CKMBINDEX, TROPONINI in the last 168 hours. BNP (last 3 results) No results for input(s): PROBNP in the last 8760 hours. CBG: Recent Labs  Lab 05/24/19 0805 05/24/19 1135  GLUCAP 120* 165*   D-Dimer: No results for input(s): DDIMER in the last 72 hours. Hgb A1c: Recent Labs    05/23/19 1330  HGBA1C 8.5*   Lipid Profile: No results for input(s): CHOL, HDL, LDLCALC, TRIG, CHOLHDL, LDLDIRECT in the last 72 hours. Thyroid function studies: No results for input(s): TSH, T4TOTAL, T3FREE, THYROIDAB in the last 72 hours.  Invalid input(s): FREET3 Anemia work up: No results for input(s): VITAMINB12, FOLATE, FERRITIN, TIBC, IRON, RETICCTPCT in the last 72 hours. Sepsis Labs: Recent Labs  Lab 05/21/19 1557 05/22/19 1545 05/23/19 1330 05/23/19 1644 05/24/19 0427  PROCALCITON 0.65  --   --   --   --   WBC 8.3 7.6 8.5  --  8.7  LATICACIDVEN  --   --  1.8 1.6  --     Microbiology Recent Results (from the past 240 hour(s))  Novel Coronavirus, NAA ([facility])     Status: None   Collection Time: 05/20/19  9:46 AM   Specimen: Nasopharyngeal(NP) swabs in vial transport medium   NASOPHARYNGE  TESTING  Result Value Ref Range Status    SARS-CoV-2, NAA Not Detected Not Detected Final    Comment: This nucleic acid amplification test was developed and its performance characteristics determined by [employer]. Nucleic acid amplification tests include PCR and TMA. This test has not been FDA cleared or approved. This test has been authorized by FDA under an Emergency Use Authorization (EUA). This test is only authorized for the duration of time the declaration that circumstances exist justifying the authorization of the emergency use of in vitro diagnostic tests for detection of SARS-CoV-2 virus and/or diagnosis of COVID-19 infection under section 564(b)(1) of the Act, 21 U.S.C. 419QQI-2(L) (1), unless the authorization is terminated  or revoked sooner. When diagnostic testing is negative, the possibility of a false negative result should be considered in the context of a patient's recent exposures and the presence of clinical signs and symptoms consistent with COVID-19. An individual without symptoms of COVID-19 and who is not shedding SARS-CoV-2 virus would  expect to have a negative (not detected) result in this assay.   Respiratory Panel by RT PCR (Flu A&B, Covid) - Nasopharyngeal Swab     Status: None   Collection Time: 05/21/19  3:41 PM   Specimen: Nasopharyngeal Swab  Result Value Ref Range Status   SARS Coronavirus 2 by RT PCR NEGATIVE NEGATIVE Final    Comment: (NOTE) SARS-CoV-2 target nucleic acids are NOT DETECTED. The SARS-CoV-2 RNA is generally detectable in upper respiratoy specimens during the acute phase of infection. The lowest concentration of SARS-CoV-2 viral copies this assay can detect is 131 copies/mL. A negative result does not preclude SARS-Cov-2 infection and should not be used as the sole basis for treatment or other patient management decisions. A negative result may occur with  improper specimen collection/handling, submission of specimen other than nasopharyngeal swab, presence of  viral mutation(s) within the areas targeted by this assay, and inadequate number of viral copies (<131 copies/mL). A negative result must be combined with clinical observations, patient history, and epidemiological information. The expected result is Negative. Fact Sheet for Patients:  PinkCheek.be Fact Sheet for Healthcare Providers:  GravelBags.it This test is not yet ap proved or cleared by the [place] FDA and  has been authorized for detection and/or diagnosis of SARS-CoV-2 by FDA under an Emergency Use Authorization (EUA). This EUA will remain  in effect (meaning this test can be used) for the duration of the COVID-19 declaration under Section 564(b)(1) of the Act, 21 U.S.C. section 360bbb-3(b)(1), unless the authorization is terminated or revoked sooner.    Influenza A by PCR NEGATIVE NEGATIVE Final   Influenza B by PCR NEGATIVE NEGATIVE Final    Comment: (NOTE) The Xpert Xpress SARS-CoV-2/FLU/RSV assay is intended as an aid in  the diagnosis of influenza from Nasopharyngeal swab specimens and  should not be used as a sole basis for treatment. Nasal washings and  aspirates are unacceptable for Xpert Xpress SARS-CoV-2/FLU/RSV  testing. Fact Sheet for Patients: PinkCheek.be Fact Sheet for Healthcare Providers: GravelBags.it This test is not yet approved or cleared by the [place] FDA and  has been authorized for detection and/or diagnosis of SARS-CoV-2 by  FDA under an Emergency Use Authorization (EUA). This EUA will remain  in effect (meaning this test can be used) for the duration of the  Covid-19 declaration under Section 564(b)(1) of the Act, 21  U.S.C. section 360bbb-3(b)(1), unless the authorization is  terminated or revoked. Performed at [facility], [address] [place], [place] [address]   Urine culture     Status: Abnormal   Collection  Time: 05/21/19 11:17 PM   Specimen: Urine, Random  Result Value Ref Range Status   Specimen Description   Final    URINE, RANDOM Performed at [facility], [address], [place], [place] [address]    Special Requests   Final    NONE Performed at [facility], [address], [place], [place] [address]    Culture MULTIPLE SPECIES PRESENT, SUGGEST RECOLLECTION (A)  Final   Report Status 05/23/2019 FINAL  Final  SARS CORONAVIRUS 2 (TAT 6-24 HRS) Nasopharyngeal Nasopharyngeal Swab     Status: None   Collection Time: 05/23/19 11:15 PM  Specimen: Nasopharyngeal Swab  Result Value Ref Range Status   SARS Coronavirus 2 NEGATIVE NEGATIVE Final    Comment: (NOTE) SARS-CoV-2 target nucleic acids are NOT DETECTED. The SARS-CoV-2 RNA is generally detectable in upper and lower respiratory specimens during the acute phase of infection. Negative results do not preclude SARS-CoV-2 infection, do not rule out co-infections with other pathogens, and should not be used as the sole basis for treatment or other patient management decisions. Negative results must be combined with clinical observations, patient history, and epidemiological information. The expected result is Negative. Fact Sheet for Patients: SugarRoll.be Fact Sheet for Healthcare Providers: https://www.woods-mathews.com/ This test is not yet approved or cleared by the [place] FDA and  has been authorized for detection and/or diagnosis of SARS-CoV-2 by FDA under an Emergency Use Authorization (EUA). This EUA will remain  in effect (meaning this test can be used) for the duration of the COVID-19 declaration under Section 56 4(b)(1) of the Act, 21 U.S.C. section 360bbb-3(b)(1), unless the authorization is terminated or revoked sooner. Performed at [facility], [place] [address], [place], [place] [address]     Procedures and diagnostic studies:  US Venous Img Lower Unilateral Right  (DVT)  Result Date: 05/23/2019 CLINICAL DATA:  Leg swelling.  Pain. EXAM: RIGHT LOWER EXTREMITY VENOUS DOPPLER ULTRASOUND TECHNIQUE: Gray-scale sonography with graded compression, as well as color Doppler and duplex ultrasound were performed to evaluate the lower extremity deep venous systems from the level of the common femoral vein and including the common femoral, femoral, profunda femoral, popliteal and calf veins including the posterior tibial, peroneal and gastrocnemius veins when visible. The superficial great saphenous vein was also interrogated. Spectral Doppler was utilized to evaluate flow at rest and with distal augmentation maneuvers in the common femoral, femoral and popliteal veins. COMPARISON:  None. FINDINGS: Contralateral Common Femoral Vein: Respiratory phasicity is normal and symmetric with the symptomatic side. No evidence of thrombus. Normal compressibility. Common Femoral Vein: No evidence of thrombus. Normal compressibility, respiratory phasicity and response to augmentation. Saphenofemoral Junction: No evidence of thrombus. Normal compressibility and flow on color Doppler imaging. Profunda Femoral Vein: No evidence of thrombus. Normal compressibility and flow on color Doppler imaging. Femoral Vein: No evidence of thrombus. Normal compressibility, respiratory phasicity and response to augmentation. Popliteal Vein: No evidence of thrombus. Normal compressibility, respiratory phasicity and response to augmentation. Calf Veins: Evaluation of the peroneal vein was limited by suboptimal compression. There is no obvious thrombus involving the tibial veins. Superficial Great Saphenous Vein: No evidence of thrombus. Normal compressibility. Venous Reflux:  None. Other Findings:  None. IMPRESSION: No evidence of deep venous thrombosis. Electronically Signed   By: Constance Holster M.D.   On: 05/23/2019 21:05    Medications:   . ARIPiprazole  10 mg Oral QHS  . atorvastatin  40 mg Oral Daily  .  buPROPion  150 mg Oral q morning - 10a  . gabapentin  300 mg Oral QHS  . insulin aspart  0-15 Units Subcutaneous TID WC  . insulin aspart  12 Units Subcutaneous TID WC  . insulin glargine  60 Units Subcutaneous QAC breakfast  . lisinopril  20 mg Oral Daily  . [START ON 05/27/2019] pantoprazole  40 mg Intravenous Q12H  . [START ON 05/25/2019] polyethylene glycol-electrolytes  4,000 mL Oral Once   Continuous Infusions: . cefTRIAXone (ROCEPHIN)  IV Stopped (05/24/19 0151)  . pantoprozole (PROTONIX) infusion 8 mg/hr (05/24/19 1000)     LOS: 1 day   Kymani Laursen  [facility]   *Please refer to [email or web address], password [email or web address] to get updated schedule on who will round on this patient, as hospitalists switch teams weekly. If 7PM-7AM, please contact night-coverage at [email or web address], password [email or web address] for any overnight needs.  05/24/2019, 4:25 PM

## 2019-05-24 NOTE — TOC Initial Note (Signed)
Transition of Care [facility]) - Initial/Assessment Note    Patient Details  Name: [name] MRN: [id number] Date of Birth: [birth year]  [facility] CM/SW Contact:    Elease Hashimoto, LCSW Phone Number: 05/24/2019, 1:32 PM  Clinical Narrative:   Met with pt for high risk screening, she reports she has many health issues. At this time they are trying to find the source of her bleeding and running tests. She is currently living with her daughter, son in-law and three grandchildren who daughter home schools. Daughter is always there and can assist if needed. Pt has been using a wheelchair in the home due to leg issues but is independent from a wheelchaair level. She has a prescription for diabetic shoes and needs to follow up with this.  She has a PCP and daughter provides transportation. Her main concern is her diabetic shoes. 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 This was a stress for pt. Will await medical tests and follow along if any discharge needs.            Expected Discharge Plan: Home/Self Care Barriers to Discharge: Continued Medical Work up   Patient Goals and CMS Choice Patient states their goals for this hospitalization and ongoing recovery are:: I want them to find where my bleeding is comng from they are running tests      Expected Discharge Plan and Services Expected Discharge Plan: Home/Self Care In-house Referral: Clinical Social Work Discharge Planning Services: NA   Living arrangements for the past 2 months: Single Family Home                                      Prior Living Arrangements/Services Living arrangements for the past 2 months: Single Family Home Lives with:: Adult Children, Other (Comment)(grandchildren) Patient language and need for interpreter reviewed:: No Do you feel safe going back to the place where you live?: Yes      Need for Family Participation in Patient Care:  No (Comment) Care giver support system in place?: Yes (comment) Current home services: DME(has a wc, tub seat and 3 in 1) Criminal Activity/Legal Involvement Pertinent to Current Situation/Hospitalization: No - Comment as needed  Activities of Daily Living Home Assistive Devices/Equipment: Eyeglasses, Wheelchair ADL Screening (condition at time of admission) Patient's cognitive ability adequate to safely complete daily activities?: Yes Is the patient deaf or have difficulty hearing?: Yes Does the patient have difficulty seeing, even when wearing glasses/contacts?: No Does the patient have difficulty concentrating, remembering, or making decisions?: Yes Patient able to express need for assistance with ADLs?: Yes Does the patient have difficulty dressing or bathing?: No Independently performs ADLs?: Yes (appropriate for developmental age) Bathing: Needs assistance Is this a change from baseline?: Pre-admission baseline Does the patient have difficulty walking or climbing stairs?: Yes Weakness of Legs: Both Weakness of Arms/Hands: None  Permission Sought/Granted                  Emotional Assessment Appearance:: Appears older than stated age Attitude/Demeanor/Rapport: Engaged, Gracious Affect (typically observed): Accepting, Adaptable Orientation: : Oriented to Self, Oriented to Place, Oriented to  Time, Oriented to Situation Alcohol / Substance Use: Other (comment)(quit tobacco) Psych Involvement: No (comment)  Admission diagnosis:  Cellulitis [L03.90] Pain [R52] Gastrointestinal hemorrhage, unspecified gastrointestinal hemorrhage type [K92.2] Anemia, unspecified type [D64.9] Cellulitis, unspecified cellulitis  site [L03.90] Patient Active Problem List   Diagnosis Date Noted  . Anemia   . Cellulitis in diabetic foot ([place]) 05/23/2019  . Hyponatremia 05/23/2019  . GI bleed 05/23/2019  . Cellulitis 05/23/2019  . Neuropathic foot ulcer, left, with fat layer exposed ([place])  02/05/2019  . Hyperkalemia 02/05/2019  . Osteomyelitis ([place]) 12/11/2018  . Pyelonephritis 12/14/2017  . Morbid obesity ([place]) 05/10/2017  . Long-term use of high-risk medication 05/10/2017  . Hyperlipidemia associated with type 2 diabetes mellitus ([place]) 06/27/2016  . Hammer toe of second toe of right foot 04/20/2016  . Essential hypertension 06/29/2015  . Schizoaffective disorder ([place]) 06/29/2015  . DM (diabetes mellitus), type 2, 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[place]) 20/72/1828  . Polyneuropathy 04/02/2014   PCP:  [name], DO Pharmacy:   [facility], [address] AT [address] [place] [address] Phone: [phone] Fax: [phone]     Social Determinants of Health (SDOH) Interventions    Readmission Risk Interventions Readmission Risk Prevention Plan 05/24/2019 10/21/2018 10/18/2018  Post Dischage Appt - Complete -  Medication Screening - Complete -  Transportation Screening Complete Complete Complete  PCP or Specialist Appt within 5-7 Days - - -  Home Care Screening - - Complete  Medication Review (RN CM) - - Complete  Medication Review (RN Care Manager) Complete - -  PCP or Specialist appointment within 3-5 days of discharge Complete - -  [place] or [place] (No Data) - -  [place] Not Applicable - -  Some recent data might be hidden

## 2019-05-25 DIAGNOSIS — G629 Polyneuropathy, unspecified: Secondary | ICD-10-CM

## 2019-05-25 DIAGNOSIS — E871 Hypo-osmolality and hyponatremia: Secondary | ICD-10-CM

## 2019-05-25 LAB — C DIFFICILE QUICK SCREEN W PCR REFLEX
C Diff antigen: NEGATIVE
C Diff interpretation: NOT DETECTED
C Diff toxin: NEGATIVE

## 2019-05-25 LAB — CBC
HCT: 26.2 % — ABNORMAL LOW (ref 36.0–46.0)
Hemoglobin: 7.9 g/dL — ABNORMAL LOW (ref 12.0–15.0)
MCH: 23.9 pg — ABNORMAL LOW (ref 26.0–34.0)
MCHC: 30.2 g/dL (ref 30.0–36.0)
MCV: 79.2 fL — ABNORMAL LOW (ref 80.0–100.0)
Platelets: 306 10*3/uL (ref 150–400)
RBC: 3.31 MIL/uL — ABNORMAL LOW (ref 3.87–5.11)
RDW: 16.2 % — ABNORMAL HIGH (ref 11.5–15.5)
WBC: 8.7 10*3/uL (ref 4.0–10.5)
nRBC: 0 % (ref 0.0–0.2)

## 2019-05-25 LAB — GLUCOSE, CAPILLARY
Glucose-Capillary: 127 mg/dL — ABNORMAL HIGH (ref 70–99)
Glucose-Capillary: 121 mg/dL — ABNORMAL HIGH (ref 70–99)
Glucose-Capillary: 113 mg/dL — ABNORMAL HIGH (ref 70–99)
Glucose-Capillary: 131 mg/dL — ABNORMAL HIGH (ref 70–99)

## 2019-05-25 LAB — BASIC METABOLIC PANEL
Anion gap: 6 (ref 5–15)
BUN: 13 mg/dL (ref 6–20)
CO2: 25 mmol/L (ref 22–32)
Calcium: 9.2 mg/dL (ref 8.9–10.3)
Chloride: 105 mmol/L (ref 98–111)
Creatinine, Ser: 1.13 mg/dL — ABNORMAL HIGH (ref 0.44–1.00)
GFR calc Af Amer: >60 mL/min (ref >60)
GFR calc non Af Amer: 55 mL/min — ABNORMAL LOW (ref >60)
Glucose, Bld: 129 mg/dL — ABNORMAL HIGH (ref 70–99)
Potassium: 4.8 mmol/L (ref 3.5–5.1)
Sodium: 136 mmol/L (ref 135–145)

## 2019-05-25 LAB — IRON AND TIBC
Iron: 31 ug/dL (ref 28–170)
Saturation Ratios: 10 % — ABNORMAL LOW (ref 10.4–31.8)
TIBC: 314 ug/dL (ref 250–450)
UIBC: 283 ug/dL

## 2019-05-25 LAB — VITAMIN B12: Vitamin B-12: 212 pg/mL (ref 180–914)

## 2019-05-25 LAB — FERRITIN: Ferritin: 42 ng/mL (ref 11–307)

## 2019-05-25 MED ORDER — INSULIN GLARGINE 100 UNIT/ML ~~LOC~~ SOLN
10.0000 [IU] | Freq: Every day | SUBCUTANEOUS | Status: DC
  Administered 2019-05-27: 10 [IU] via SUBCUTANEOUS
  Filled 2019-05-25 (×3): qty 0.1

## 2019-05-25 NOTE — Anesthesia Preprocedure Evaluation (Addendum)
Anesthesia Evaluation  Patient identified by MRN, date of birth, ID band Patient awake    Reviewed: Allergy & Precautions, H&P , NPO status , Patient's Chart, lab work & pertinent test results  History of Anesthesia Complications Negative for: history of anesthetic complications  Airway Mallampati: III  TM Distance: >3 FB Neck ROM: full    Dental  (+) Chipped, Poor Dentition, Missing   Pulmonary neg shortness of breath, former smoker,           Cardiovascular Exercise Tolerance: Good hypertension, (-) angina(-) Past MI and (-) DOE      Neuro/Psych PSYCHIATRIC DISORDERS Depression Schizophrenia  Neuromuscular disease    GI/Hepatic negative GI ROS, Neg liver ROS, Heme positive stool   Endo/Other  diabetes, Type 2, Insulin Dependent  Renal/GU negative Renal ROS  negative genitourinary   Musculoskeletal   Abdominal   Peds  Hematology negative hematology ROS (+) Blood dyscrasia, anemia ,   Anesthesia Other Findings Leg cellulitis  Past Medical History: No date: Depression No date: Diabetes mellitus without complication (HCC) No date: Hyperlipidemia No date: Hypertension No date: Neuromuscular disorder ([place]) No date: Schizo affective schizophrenia ([place])     Comment:  abilify and pazil  Past Surgical History: No date: CHOLECYSTECTOMY 02/26/2016: IRRIGATION AND DEBRIDEMENT FOOT; Right     Comment:  Procedure: RIGHT 2ND TOE DEBRIDEMENT;  Surgeon: Samara Deist, DPM;  Location: [facility] ORS;  Service: Podiatry;                Laterality: Right; 12/13/2018: IRRIGATION AND DEBRIDEMENT FOOT; Left     Comment:  Procedure: IRRIGATION AND DEBRIDEMENT FOOT;  Surgeon:               Caroline More, DPM;  Location: [facility] ORS;  Service:               Podiatry;  Laterality: Left;  BMI    Body Mass Index: 37.81 kg/m      Reproductive/Obstetrics negative OB ROS                             Anesthesia Physical Anesthesia Plan  ASA: III  Anesthesia Plan: General   Post-op Pain Management:    Induction: Intravenous  PONV Risk Score and Plan: Propofol infusion and TIVA  Airway Management Planned: Natural Airway and Nasal Cannula  Additional Equipment:   Intra-op Plan:   Post-operative Plan:   Informed Consent: I have reviewed the patients History and Physical, chart, labs and discussed the procedure including the risks, benefits and alternatives for the proposed anesthesia with the patient or authorized representative who has indicated his/her understanding and acceptance.     Dental Advisory Given  Plan Discussed with: Anesthesiologist, CRNA and Surgeon  Anesthesia Plan Comments: (Patient consented for risks of anesthesia including but not limited to:  - adverse reactions to medications - risk of intubation if required - damage to teeth, lips or other oral mucosa - sore throat or hoarseness - Damage to heart, brain, lungs or loss of life  Patient voiced understanding.)       Anesthesia Quick Evaluation

## 2019-05-25 NOTE — Progress Notes (Signed)
GI Inpatient Follow-up Note  Patient Identification: [name] is a 54 y.o. female  with a known history of hypertension, hyperlipidemia, diabetes type 2 with Charcot foot and polyneuropathy, and schizoaffective disorder who presented to the [facility] ED with concerns of right leg cellulitis.  During ED evaluation, Hgb was found to decrease from 10.2 on 05/21/19 to 8.2 on 8.8 on 05/22/19. Stool was hemoccult + on DRE. This morning, Hgb returned at 8.0. Further chart review indicates Hgb has trended down over the last several years, initially dropping from 14 > 11 in 01/2016, maintaining baseline 11-12 through 2018-2019, then decreasing to 9-10 in 2020. GI consultation was placed due to acute-on-chronic anemia and heme positive stool. Patient also reported complaints nausea, bloating, and acid reflux x 7-10 days, and diarrhea with nocturnal events for an undefined period of time.   [name] reports feeling "a little better today". She feels less nauseous, and tolerated solid dinner last night plus clear liquids this morning well. Reflux is better on pantoprazole. She passed a brown, semi-formed BM this morning. No new concerns including hematochezia, melena, vomiting, abdominal pain, or dysphagia.  Subjective:  Scheduled Inpatient Medications:  . ARIPiprazole  10 mg Oral QHS  . atorvastatin  40 mg Oral Daily  . buPROPion  150 mg Oral q morning - 10a  . gabapentin  300 mg Oral QHS  . insulin aspart  0-15 Units Subcutaneous TID WC  . insulin glargine  60 Units Subcutaneous QAC breakfast  . lisinopril  20 mg Oral Daily  . [START ON 05/27/2019] pantoprazole  40 mg Intravenous Q12H  . polyethylene glycol-electrolytes  4,000 mL Oral Once    Continuous Inpatient Infusions:   . cefTRIAXone (ROCEPHIN)  IV 2 g (05/24/19 2338)  . pantoprozole (PROTONIX) infusion 8 mg/hr (05/25/19 0516)    PRN Inpatient Medications:  ondansetron **OR** ondansetron (ZOFRAN) IV  Review of  Systems: Constitutional: Weight is stable.  Eyes: No changes in vision. ENT: No oral lesions, sore throat.  GI: see HPI.  Heme/Lymph: No easy bruising.  CV: No chest pain.  GU: No hematuria.  Integumentary: No rashes.  Neuro: No headaches.  Psych: No depression/anxiety.  Endocrine: No heat/cold intolerance.  Allergic/Immunologic: No urticaria.  Resp: No cough, SOB.  Musculoskeletal: No joint swelling.    Physical Examination: BP 130/78 (BP Location: Left Arm)   Pulse 93   Temp 97.8 F (36.6 C) (Oral)   Resp 18   Ht 5\' 10"  (1.778 m)   Wt 119.5 kg   SpO2 98%   BMI 37.81 kg/m  Gen: NAD, alert and oriented x 4 HEENT: PEERLA, EOMI, Neck: supple, no JVD or thyromegaly Chest: CTA bilaterally, no wheezes, crackles, or other adventitious sounds CV: RRR, no m/g/c/r Abd: soft, NT, ND, +BS in all four quadrants; no HSM, guarding, ridigity, or rebound tenderness Ext: no edema, well perfused with 2+ pulses, Skin: no rash or lesions noted Lymph: no LAD  Data: Lab Results  Component Value Date   WBC 8.7 05/25/2019   HGB 7.9 (L) 05/25/2019   HCT 26.2 (L) 05/25/2019   MCV 79.2 (L) 05/25/2019   PLT 306 05/25/2019   Recent Labs  Lab 05/23/19 1330 05/24/19 0427 05/25/19 0724  HGB 8.2* 8.0* 7.9*   Lab Results  Component Value Date   NA 129 (L) 05/23/2019   K 4.4 05/23/2019   CL 95 (L) 05/23/2019   CO2 24 05/23/2019   BUN 16 05/23/2019   CREATININE 1.44 (H) 05/23/2019   Lab  Results  Component Value Date   ALT 34 05/22/2019   AST 25 05/22/2019   ALKPHOS 102 05/22/2019   BILITOT 0.5 05/22/2019   No results for input(s): APTT, INR, PTT in the last 168 hours.   Assessment/Plan: [name] is a 54 y.o. female [name] is a 54 y.o. female with a known history of hypertension, hyperlipidemia, diabetes type 2 with Charcot foot and polyneuropathy, and schizoaffective disorder admitted for R-leg cellulitis and found to have acute-on-chronic anemia with heme positive  stool. She   1. Acute-on-chronic anemia - Hgb has declined over the last several years from 11-12 > 9-10 in 2020, now stable at 7.9-8. In addition to ruling out GI bleeding, also consider a component of chronic disease given multiple comorbidities. 2. Heme positive stool - DDx includes occur upper GI bleeding from peptic etiologies given GERD, nausea, and bloating symptoms. However, could also be secondary to anal-outlet bleeding from diarrhea. Denies NSAIDs/blood thinners. No prior endoscopic evaluations.   3. Nausea - Improved today. No vomiting or pain. Consider upper GI pathologies given #1-2. 4. Bloating - As above. 5. Diarrhea - Semi-formed brown stool today per patient. Still consider possibility of iInfectious diarrhea considered in the setting of recent antibiotics. May also be functional due to uncontrolled diabetes, polypharmacy, diet triggers, etc.  Recommendations: 1. Continue clear liquid diet today,1/23. Orders for Trilyte bowel prep placed. EGD and colonoscopy scheduled for tomorrow, 1/24. 2. GI PCR and C diff testing placed. Pt advised to notify nursing staff prior to BM. 3. Continue IV pantoprazole 40 mg q 12 hrs. 4. Continue to monitor serial Hgb - transfuse <7.  Patient has been discussed with Dr. Alice Reichert, pending further GI recommendations at this time. Please call with questions or concerns.  Lavera Guise, PA-C [facility] Phone: [phone] Pager: [phone]

## 2019-05-25 NOTE — Progress Notes (Signed)
Progress Note    [name]  OTL:[id number] DOB: 10-12-1965  DOA: 05/23/2019 PCP: [name], DO      Brief Narrative:    Medical records reviewed and are as summarized below:  [name] is an 54 y.o. female with medical history significant of diabetes mellitus type 2, hypertension, hyperlipidemia, Charcot foot, schizoaffective disorders, came to emergency room accompanied by daughter because of concern for possible sepsis right lower leg cellulitis.  Patient was diagnosed with cellulitis few days ago and was started on antibiotics, Keflex. Patient had had recent emergency department visits for both cellulitis as well as concern about patient's ability to care for self.      Assessment/Plan:   Active Problems:   DM (diabetes mellitus), type 2, 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HCC)   Polyneuropathy   Essential hypertension   Schizoaffective disorder (HCC)   Hammer toe of second toe of right foot   Hyperlipidemia associated with type 2 diabetes mellitus ([place])   Neuropathic foot ulcer, left, with fat layer exposed ([place])   Cellulitis in diabetic foot (HCC)   Hyponatremia   GI bleed   Cellulitis   Body mass index is 37.81 kg/m.  (Morbid obesity)  Leg cellulitis: Improving.  Change IV Rocephin to Omnicef tomorrow  Acute on chronic anemia, likely due to GI bleed: Monitor H&H.  No indication for blood transfusion at this time.  Check iron studies and vitamin B12 level  Positive heme stools/occult GI bleeding: Consulted gastroenterologist for further evaluation.  Plan for EGD and colonoscopy tomorrow.  Continue IV Protonix.  Insulin-dependent diabetes mellitus with peripheral neuropathy: continue Lantus but decrease dose to 10 units because he will be n.p.o overnight.  Continue gabapentin.  Hypertension: Continue antihypertensives  Hyponatremia: This is chronic/fluctuating.  Improved.  Schizoaffective disorder: Continue  psychotropics.   Family Communication/Anticipated D/C date and plan/Code Status   DVT prophylaxis: SCDs Code Status: Full code Family Communication: Plan discussed with the patient Disposition Plan: To be determined      Subjective:   She feels better today.  She said she has had on and off loose stools.  No bloody stools.  No vomiting or abdominal pain.  Right leg pain is better.  Objective:    Vitals:   05/24/19 0802 05/24/19 1605 05/24/19 2305 05/25/19 0748  BP: 140/75 (!) 146/84 (!) 145/88 130/78  Pulse: (!) 106 93 100 93  Resp: 17 17 17 18   Temp: 97.7 F (36.5 C) (!) 97.5 F (36.4 C) 97.8 F (36.6 C) 97.8 F (36.6 C)  TempSrc: Oral Oral  Oral  SpO2: 100% 100% 99% 98%  Weight:      Height:        Intake/Output Summary (Last 24 hours) at 05/25/2019 1232 Last data filed at 05/24/2019 1800 Gross per 24 hour  Intake 840 ml  Output --  Net 840 ml   Filed Weights   05/23/19 1318 05/24/19 0215  Weight: 120.2 kg 119.5 kg    Exam:  GEN: NAD SKIN: No rash EYES: EOMI ENT: MMM CV: RRR PULM: CTA B ABD: soft, obese, NT, +BS CNS: AAO x 3, non focal EXT: No swelling, redness or tenderness on right leg.  Charcot foot, left   Data Reviewed:   I have personally reviewed following labs and imaging studies:  Labs: Labs show the following:   Basic Metabolic Panel: Recent Labs  Lab 05/21/19 1557 05/21/19 1557 05/22/19 1545 05/22/19 1545 05/23/19 1330 05/25/19 0724  NA 126*  --  127*  --  129* 136  K 4.9   < > 4.3   < > 4.4 4.8  CL 92*  --  91*  --  95* 105  CO2 22  --  23  --  24 25  GLUCOSE 133*  --  224*  --  161* 129*  BUN 16  --  14  --  16 13  CREATININE 1.35*  --  1.30*  --  1.44* 1.13*  CALCIUM 9.4  --  9.1  --  9.0 9.2   < > = values in this interval not displayed.   GFR Estimated Creatinine Clearance: 78.9 mL/min (A) (by C-G formula based on SCr of 1.13 mg/dL (H)). Liver Function Tests: Recent Labs  Lab 05/21/19 1557 05/22/19 1545   AST 28 25  ALT 39 34  ALKPHOS 114 102  BILITOT 0.5 0.5  PROT 7.6 6.6  ALBUMIN 3.7 3.2*   No results for input(s): LIPASE, AMYLASE in the last 168 hours. No results for input(s): AMMONIA in the last 168 hours. Coagulation profile No results for input(s): INR, PROTIME in the last 168 hours.  CBC: Recent Labs  Lab 05/21/19 1557 05/22/19 1545 05/23/19 1330 05/24/19 0427 05/25/19 0724  WBC 8.3 7.6 8.5 8.7 8.7  NEUTROABS 6.4  --   --  5.4  --   HGB 10.2* 8.8* 8.2* 8.0* 7.9*  HCT 33.2* 28.7* 26.5* 25.6* 26.2*  MCV 79.0* 79.9* 77.5* 77.8* 79.2*  PLT 364 337 309 316 306   Cardiac Enzymes: No results for input(s): CKTOTAL, CKMB, CKMBINDEX, TROPONINI in the last 168 hours. BNP (last 3 results) No results for input(s): PROBNP in the last 8760 hours. CBG: Recent Labs  Lab 05/24/19 0805 05/24/19 1135 05/24/19 1658 05/25/19 0816 05/25/19 1200  GLUCAP 120* 165* 106* 127* 121*   D-Dimer: No results for input(s): DDIMER in the last 72 hours. Hgb A1c: Recent Labs    05/23/19 1330  HGBA1C 8.5*   Lipid Profile: No results for input(s): CHOL, HDL, LDLCALC, TRIG, CHOLHDL, LDLDIRECT in the last 72 hours. Thyroid function studies: No results for input(s): TSH, T4TOTAL, T3FREE, THYROIDAB in the last 72 hours.  Invalid input(s): FREET3 Anemia work up: No results for input(s): VITAMINB12, FOLATE, FERRITIN, TIBC, IRON, RETICCTPCT in the last 72 hours. Sepsis Labs: Recent Labs  Lab 05/21/19 1557 05/21/19 1557 05/22/19 1545 05/23/19 1330 05/23/19 1644 05/24/19 0427 05/25/19 0724  PROCALCITON 0.65  --   --   --   --   --   --   WBC 8.3   < > 7.6 8.5  --  8.7 8.7  LATICACIDVEN  --   --   --  1.8 1.6  --   --    < > = values in this interval not displayed.    Microbiology Recent Results (from the past 240 hour(s))  Novel Coronavirus, NAA ([facility])     Status: None   Collection Time: 05/20/19  9:46 AM   Specimen: Nasopharyngeal(NP) swabs in vial transport medium    NASOPHARYNGE  TESTING  Result Value Ref Range Status   SARS-CoV-2, NAA Not Detected Not Detected Final    Comment: This nucleic acid amplification test was developed and its performance characteristics determined by [employer]. Nucleic acid amplification tests include PCR and TMA. This test has not been FDA cleared or approved. This test has been authorized by FDA under an Emergency Use Authorization (EUA). This test is only authorized for the duration of time the declaration that  circumstances exist justifying the authorization of the emergency use of in vitro diagnostic tests for detection of SARS-CoV-2 virus and/or diagnosis of COVID-19 infection under section 564(b)(1) of the Act, 21 U.S.C. 449QPR-9(F) (1), unless the authorization is terminated or revoked sooner. When diagnostic testing is negative, the possibility of a false negative result should be considered in the context of a patient's recent exposures and the presence of clinical signs and symptoms consistent with COVID-19. An individual without symptoms of COVID-19 and who is not shedding SARS-CoV-2 virus would  expect to have a negative (not detected) result in this assay.   Respiratory Panel by RT PCR (Flu A&B, Covid) - Nasopharyngeal Swab     Status: None   Collection Time: 05/21/19  3:41 PM   Specimen: Nasopharyngeal Swab  Result Value Ref Range Status   SARS Coronavirus 2 by RT PCR NEGATIVE NEGATIVE Final    Comment: (NOTE) SARS-CoV-2 target nucleic acids are NOT DETECTED. The SARS-CoV-2 RNA is generally detectable in upper respiratoy specimens during the acute phase of infection. The lowest concentration of SARS-CoV-2 viral copies this assay can detect is 131 copies/mL. A negative result does not preclude SARS-Cov-2 infection and should not be used as the sole basis for treatment or other patient management decisions. A negative result may occur with  improper specimen collection/handling, submission of  specimen other than nasopharyngeal swab, presence of viral mutation(s) within the areas targeted by this assay, and inadequate number of viral copies (<131 copies/mL). A negative result must be combined with clinical observations, patient history, and epidemiological information. The expected result is Negative. Fact Sheet for Patients:  PinkCheek.be Fact Sheet for Healthcare Providers:  GravelBags.it This test is not yet ap proved or cleared by the [place] FDA and  has been authorized for detection and/or diagnosis of SARS-CoV-2 by FDA under an Emergency Use Authorization (EUA). This EUA will remain  in effect (meaning this test can be used) for the duration of the COVID-19 declaration under Section 564(b)(1) of the Act, 21 U.S.C. section 360bbb-3(b)(1), unless the authorization is terminated or revoked sooner.    Influenza A by PCR NEGATIVE NEGATIVE Final   Influenza B by PCR NEGATIVE NEGATIVE Final    Comment: (NOTE) The Xpert Xpress SARS-CoV-2/FLU/RSV assay is intended as an aid in  the diagnosis of influenza from Nasopharyngeal swab specimens and  should not be used as a sole basis for treatment. Nasal washings and  aspirates are unacceptable for Xpert Xpress SARS-CoV-2/FLU/RSV  testing. Fact Sheet for Patients: PinkCheek.be Fact Sheet for Healthcare Providers: GravelBags.it This test is not yet approved or cleared by the [place] FDA and  has been authorized for detection and/or diagnosis of SARS-CoV-2 by  FDA under an Emergency Use Authorization (EUA). This EUA will remain  in effect (meaning this test can be used) for the duration of the  Covid-19 declaration under Section 564(b)(1) of the Act, 21  U.S.C. section 360bbb-3(b)(1), unless the authorization is  terminated or revoked. Performed at [facility], [address], [place], [place] [address]    Urine culture     Status: Abnormal   Collection Time: 05/21/19 11:17 PM   Specimen: Urine, Random  Result Value Ref Range Status   Specimen Description   Final    URINE, RANDOM Performed at [facility], [address] [place], [place] [address]    Special Requests   Final    NONE Performed at [facility], [address], [place], [place] [address]    Culture  MULTIPLE SPECIES PRESENT, SUGGEST RECOLLECTION (A)  Final   Report Status 05/23/2019 FINAL  Final  SARS CORONAVIRUS 2 (TAT 6-24 HRS) Nasopharyngeal Nasopharyngeal Swab     Status: None   Collection Time: 05/23/19 11:15 PM   Specimen: Nasopharyngeal Swab  Result Value Ref Range Status   SARS Coronavirus 2 NEGATIVE NEGATIVE Final    Comment: (NOTE) SARS-CoV-2 target nucleic acids are NOT DETECTED. The SARS-CoV-2 RNA is generally detectable in upper and lower respiratory specimens during the acute phase of infection. Negative results do not preclude SARS-CoV-2 infection, do not rule out co-infections with other pathogens, and should not be used as the sole basis for treatment or other patient management decisions. Negative results must be combined with clinical observations, patient history, and epidemiological information. The expected result is Negative. Fact Sheet for Patients: SugarRoll.be Fact Sheet for Healthcare Providers: https://www.woods-mathews.com/ This test is not yet approved or cleared by the [place] FDA and  has been authorized for detection and/or diagnosis of SARS-CoV-2 by FDA under an Emergency Use Authorization (EUA). This EUA will remain  in effect (meaning this test can be used) for the duration of the COVID-19 declaration under Section 56 4(b)(1) of the Act, 21 U.S.C. section 360bbb-3(b)(1), unless the authorization is terminated or revoked sooner. Performed at [facility], [place] [address] [place], [place] [address]   C Difficile Quick Screen w  PCR reflex     Status: None   Collection Time: 05/24/19 10:00 PM   Specimen: STOOL  Result Value Ref Range Status   C Diff antigen NEGATIVE NEGATIVE Final   C Diff toxin NEGATIVE NEGATIVE Final   C Diff interpretation No C. difficile detected.  Final    Comment: Performed at [facility], [address] [place], [place] [address]    Procedures and diagnostic studies:  US Venous Img Lower Unilateral Right (DVT)  Result Date: 05/23/2019 CLINICAL DATA:  Leg swelling.  Pain. EXAM: RIGHT LOWER EXTREMITY VENOUS DOPPLER ULTRASOUND TECHNIQUE: Gray-scale sonography with graded compression, as well as color Doppler and duplex ultrasound were performed to evaluate the lower extremity deep venous systems from the level of the common femoral vein and including the common femoral, femoral, profunda femoral, popliteal and calf veins including the posterior tibial, peroneal and gastrocnemius veins when visible. The superficial great saphenous vein was also interrogated. Spectral Doppler was utilized to evaluate flow at rest and with distal augmentation maneuvers in the common femoral, femoral and popliteal veins. COMPARISON:  None. FINDINGS: Contralateral Common Femoral Vein: Respiratory phasicity is normal and symmetric with the symptomatic side. No evidence of thrombus. Normal compressibility. Common Femoral Vein: No evidence of thrombus. Normal compressibility, respiratory phasicity and response to augmentation. Saphenofemoral Junction: No evidence of thrombus. Normal compressibility and flow on color Doppler imaging. Profunda Femoral Vein: No evidence of thrombus. Normal compressibility and flow on color Doppler imaging. Femoral Vein: No evidence of thrombus. Normal compressibility, respiratory phasicity and response to augmentation. Popliteal Vein: No evidence of thrombus. Normal compressibility, respiratory phasicity and response to augmentation. Calf Veins: Evaluation of the peroneal vein was limited  by suboptimal compression. There is no obvious thrombus involving the tibial veins. Superficial Great Saphenous Vein: No evidence of thrombus. Normal compressibility. Venous Reflux:  None. Other Findings:  None. IMPRESSION: No evidence of deep venous thrombosis. Electronically Signed   By: Constance Holster M.D.   On: 05/23/2019 21:05    Medications:   . ARIPiprazole  10 mg Oral QHS  . atorvastatin  40  mg Oral Daily  . buPROPion  150 mg Oral q morning - 10a  . gabapentin  300 mg Oral QHS  . insulin aspart  0-15 Units Subcutaneous TID WC  . insulin glargine  60 Units Subcutaneous QAC breakfast  . lisinopril  20 mg Oral Daily  . [START ON 05/27/2019] pantoprazole  40 mg Intravenous Q12H  . polyethylene glycol-electrolytes  4,000 mL Oral Once   Continuous Infusions: . cefTRIAXone (ROCEPHIN)  IV 2 g (05/24/19 2338)  . pantoprozole (PROTONIX) infusion 8 mg/hr (05/25/19 0516)     LOS: 2 days   Wanell Lorenzi  [facility]   *Please refer to [email or web address], password [email or web address] to get updated schedule on who will round on this patient, as hospitalists switch teams weekly. If 7PM-7AM, please contact night-coverage at [email or web address], password [email or web address] for any overnight needs.  05/25/2019, 12:32 PM

## 2019-05-26 ENCOUNTER — Inpatient Hospital Stay: Payer: Medicare Other | Admitting: Anesthesiology

## 2019-05-26 ENCOUNTER — Encounter: Payer: Self-pay | Admitting: Emergency Medicine

## 2019-05-26 ENCOUNTER — Inpatient Hospital Stay: Payer: Medicare Other

## 2019-05-26 ENCOUNTER — Encounter
Admission: EM | Disposition: A | Discharge status: Skilled Nursing Facility | Payer: Self-pay | Source: Home / Self Care | Attending: Surgery

## 2019-05-26 DIAGNOSIS — K6389 Other specified diseases of intestine: Secondary | ICD-10-CM

## 2019-05-26 DIAGNOSIS — C189 Malignant neoplasm of colon, unspecified: Secondary | ICD-10-CM | POA: Diagnosis present

## 2019-05-26 HISTORY — PX: COLONOSCOPY WITH PROPOFOL: SHX5780

## 2019-05-26 HISTORY — PX: ESOPHAGOGASTRODUODENOSCOPY (EGD) WITH PROPOFOL: SHX5813

## 2019-05-26 LAB — GLUCOSE, CAPILLARY
Glucose-Capillary: 84 mg/dL (ref 70–99)
Glucose-Capillary: 91 mg/dL (ref 70–99)
Glucose-Capillary: 92 mg/dL (ref 70–99)

## 2019-05-26 LAB — CBC
HCT: 29 % — ABNORMAL LOW (ref 36.0–46.0)
Hemoglobin: 8.7 g/dL — ABNORMAL LOW (ref 12.0–15.0)
MCH: 23.7 pg — ABNORMAL LOW (ref 26.0–34.0)
MCHC: 30 g/dL (ref 30.0–36.0)
MCV: 79 fL — ABNORMAL LOW (ref 80.0–100.0)
Platelets: 399 10*3/uL (ref 150–400)
RBC: 3.67 MIL/uL — ABNORMAL LOW (ref 3.87–5.11)
RDW: 16.4 % — ABNORMAL HIGH (ref 11.5–15.5)
WBC: 10 10*3/uL (ref 4.0–10.5)
nRBC: 0 % (ref 0.0–0.2)

## 2019-05-26 LAB — VITAMIN B12: Vitamin B-12: 264 pg/mL (ref 180–914)

## 2019-05-26 SURGERY — ESOPHAGOGASTRODUODENOSCOPY (EGD) WITH PROPOFOL
Anesthesia: General

## 2019-05-26 MED ORDER — PROPOFOL 500 MG/50ML IV EMUL
INTRAVENOUS | Status: DC | PRN
  Administered 2019-05-26: 150 ug/kg/min via INTRAVENOUS

## 2019-05-26 MED ORDER — PROPOFOL 500 MG/50ML IV EMUL
INTRAVENOUS | Status: AC
  Filled 2019-05-26: qty 50

## 2019-05-26 MED ORDER — FERROUS SULFATE 325 (65 FE) MG PO TABS
325.0000 mg | ORAL_TABLET | ORAL | Status: DC
  Administered 2019-05-26 – 2019-06-03 (×4): 325 mg via ORAL
  Filled 2019-05-26 (×5): qty 1

## 2019-05-26 MED ORDER — CEPHALEXIN 500 MG PO CAPS: 500.0000 mg | ORAL_CAPSULE | Freq: Two times a day (BID) | ORAL | Status: DC

## 2019-05-26 MED ORDER — IOHEXOL 300 MG/ML  SOLN
125.0000 mL | Freq: Once | INTRAMUSCULAR | Status: AC | PRN
  Administered 2019-05-26: 125 mL via INTRAVENOUS

## 2019-05-26 MED ORDER — SPOT INK MARKER SYRINGE KIT
PACK | SUBMUCOSAL | Status: DC | PRN
  Administered 2019-05-26: 8 mL via SUBMUCOSAL

## 2019-05-26 MED ORDER — CEPHALEXIN 500 MG PO CAPS
500.0000 mg | ORAL_CAPSULE | Freq: Two times a day (BID) | ORAL | Status: DC
  Administered 2019-05-26 – 2019-05-27 (×4): 500 mg via ORAL
  Filled 2019-05-26 (×4): qty 1

## 2019-05-26 MED ORDER — PHENYLEPHRINE HCL (PRESSORS) 10 MG/ML IV SOLN
INTRAVENOUS | Status: DC | PRN
  Administered 2019-05-26 (×3): 100 ug via INTRAVENOUS

## 2019-05-26 MED ORDER — VITAMIN B-12 1000 MCG PO TABS
1000.0000 ug | ORAL_TABLET | Freq: Every day | ORAL | Status: DC
  Administered 2019-05-26 – 2019-06-04 (×9): 1000 ug via ORAL
  Filled 2019-05-26 (×9): qty 1

## 2019-05-26 MED ORDER — IOHEXOL 9 MG/ML PO SOLN
500.0000 mL | ORAL | Status: AC
  Administered 2019-05-26 (×2): 500 mL via ORAL

## 2019-05-26 MED ORDER — PROPOFOL 10 MG/ML IV BOLUS
INTRAVENOUS | Status: DC | PRN
  Administered 2019-05-26: 30 mg via INTRAVENOUS
  Administered 2019-05-26: 50 mg via INTRAVENOUS

## 2019-05-26 MED ORDER — SODIUM CHLORIDE 0.9 % IV SOLN: INTRAVENOUS | Status: DC | PRN

## 2019-05-26 NOTE — Progress Notes (Addendum)
Progress Note    MATASHA SMIGELSKI  YKZ:[id number] DOB: Sep 07, 1965  DOA: 05/23/2019 PCP: [name], DO      Brief Narrative:    Medical records reviewed and are as summarized below:  CHARMION HAPKE is an 54 y.o. female with medical history significant of diabetes mellitus type 2, hypertension, hyperlipidemia, Charcot foot, schizoaffective disorders, came to emergency room accompanied by daughter because of concern for possible sepsis right lower leg cellulitis.  Patient was diagnosed with cellulitis few days ago and was started on antibiotics, Keflex. Patient had had recent emergency department visits for both cellulitis as well as concern about patient's ability to care for self.      Assessment/Plan:   Active Problems:   DM (diabetes mellitus), type 2, 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HCC)   Polyneuropathy   Essential hypertension   Schizoaffective disorder (HCC)   Hammer toe of second toe of right foot   Hyperlipidemia associated with type 2 diabetes mellitus ([place])   Neuropathic foot ulcer, left, with fat layer exposed ([place])   Cellulitis in diabetic foot (HCC)   Hyponatremia   GI bleed   Cellulitis   Colonic mass   Body mass index is 37.81 kg/m.  (Morbid obesity)  Leg cellulitis: Improved.  Start Keflex today.  Acute on chronic anemia, likely due to GI bleed: Monitor H&H.  No indication for blood transfusion at this time.  Low normal vitamin B12, iron and ferritin levels.  Start iron and B12 supplements   Colonic mass/occult GI bleeding: s/p EGD and colonoscopy on 05/26/2019.  EGD showed gastric polyps that were biopsied and colonoscopy showed polypoid, sessile nonulcerated partially obstructing medium-sized mass in the mid sigmoid colon that was biopsied.  Discontinue IV Protonix.  Consulted Dr. 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  Continue gabapentin.  Hypertension: Continue antihypertensives  Hyponatremia: This is chronic/fluctuating.  Improved.  Schizoaffective disorder: Continue psychotropics.   Family Communication/Anticipated D/C date and plan/Code Status   DVT prophylaxis: SCDs Code Status: Full code Family Communication: Plan discussed with daughter, [name], over the phone Disposition Plan: To be determined.  Awaiting recommendations from general surgeon.     Subjective:   No complaints.  No diarrhea, abdominal pain or vomiting.   Objective:    Vitals:   05/26/19 0911 05/26/19 0920 05/26/19 0930 05/26/19 1005  BP: 106/68 107/72 139/77 (!) 157/106  Pulse: (!) 101 98 99 (!) 107  Resp: 20 20 18    Temp:   (!) 97.1 F (36.2 C)   TempSrc:      SpO2: 96% 97% 96% 97%  Weight:      Height:        Intake/Output Summary (Last 24 hours) at 05/26/2019 1155 Last data filed at 05/26/2019 0854 Gross per 24 hour  Intake 800 ml  Output --  Net 800 ml   Filed Weights   05/23/19 1318 05/24/19 0215  Weight: 120.2 kg 119.5 kg    Exam:  GEN: NAD SKIN: No rash EYES: No pallor; anicteric ENT: MMM CV: RRR PULM: CTA B ABD: soft, obese, NT, +BS CNS: AAO x 3, non focal EXT: No edema or tenderness. Charcot foot, left    Data Reviewed:   I have personally reviewed following labs and imaging studies:  Labs: Labs show the following:   Basic Metabolic Panel: Recent Labs  Lab 05/21/19 1557 05/21/19 1557 05/22/19 1545 05/22/19  1545 05/23/19 1330 05/25/19 0724  NA 126*  --  127*  --  129* 136  K 4.9   < > 4.3   < > 4.4 4.8  CL 92*  --  91*  --  95* 105  CO2 22  --  23  --  24 25  GLUCOSE 133*  --  224*  --  161* 129*  BUN 16  --  14  --  16 13  CREATININE 1.35*  --  1.30*  --  1.44* 1.13*  CALCIUM 9.4  --  9.1  --  9.0 9.2   < > = values in this interval not displayed.   GFR Estimated Creatinine Clearance: 78.9 mL/min (A) (by C-G formula  based on SCr of 1.13 mg/dL (H)). Liver Function Tests: Recent Labs  Lab 05/21/19 1557 05/22/19 1545  AST 28 25  ALT 39 34  ALKPHOS 114 102  BILITOT 0.5 0.5  PROT 7.6 6.6  ALBUMIN 3.7 3.2*   No results for input(s): LIPASE, AMYLASE in the last 168 hours. No results for input(s): AMMONIA in the last 168 hours. Coagulation profile No results for input(s): INR, PROTIME in the last 168 hours.  CBC: Recent Labs  Lab 05/21/19 1557 05/21/19 1557 05/22/19 1545 05/23/19 1330 05/24/19 0427 05/25/19 0724 05/26/19 0540  WBC 8.3   < > 7.6 8.5 8.7 8.7 10.0  NEUTROABS 6.4  --   --   --  5.4  --   --   HGB 10.2*   < > 8.8* 8.2* 8.0* 7.9* 8.7*  HCT 33.2*   < > 28.7* 26.5* 25.6* 26.2* 29.0*  MCV 79.0*   < > 79.9* 77.5* 77.8* 79.2* 79.0*  PLT 364   < > 337 309 316 306 399   < > = values in this interval not displayed.   Cardiac Enzymes: No results for input(s): CKTOTAL, CKMB, CKMBINDEX, TROPONINI in the last 168 hours. BNP (last 3 results) No results for input(s): PROBNP in the last 8760 hours. CBG: Recent Labs  Lab 05/25/19 1200 05/25/19 1636 05/25/19 2121 05/26/19 0727 05/26/19 1134  GLUCAP 121* 113* 131* 84 92   D-Dimer: No results for input(s): DDIMER in the last 72 hours. Hgb A1c: Recent Labs    05/23/19 1330  HGBA1C 8.5*   Lipid Profile: No results for input(s): CHOL, HDL, LDLCALC, TRIG, CHOLHDL, LDLDIRECT in the last 72 hours. Thyroid function studies: No results for input(s): TSH, T4TOTAL, T3FREE, THYROIDAB in the last 72 hours.  Invalid input(s): FREET3 Anemia work up: Recent Labs    05/25/19 0724 05/25/19 0939  VITAMINB12 212  --   FERRITIN  --  42  TIBC  --  314  IRON  --  31   Sepsis Labs: Recent Labs  Lab 05/21/19 1557 05/22/19 1545 05/23/19 1330 05/23/19 1644 05/24/19 0427 05/25/19 0724 05/26/19 0540  PROCALCITON 0.65  --   --   --   --   --   --   WBC 8.3   < > 8.5  --  8.7 8.7 10.0  LATICACIDVEN  --   --  1.8 1.6  --   --   --    < >  = values in this interval not displayed.    Microbiology Recent Results (from the past 240 hour(s))  Novel Coronavirus, NAA ([facility])     Status: None   Collection Time: 05/20/19  9:46 AM   Specimen: Nasopharyngeal(NP) swabs in vial transport medium   NASOPHARYNGE  TESTING  Result Value Ref Range Status   SARS-CoV-2, NAA Not Detected Not Detected Final    Comment: This nucleic acid amplification test was developed and its performance characteristics determined by [employer]. Nucleic acid amplification tests include PCR and TMA. This test has not been FDA cleared or approved. This test has been authorized by FDA under an Emergency Use Authorization (EUA). This test is only authorized for the duration of time the declaration that circumstances exist justifying the authorization of the emergency use of in vitro diagnostic tests for detection of SARS-CoV-2 virus and/or diagnosis of COVID-19 infection under section 564(b)(1) of the Act, 21 U.S.C. 229NLG-9(Q) (1), unless the authorization is terminated or revoked sooner. When diagnostic testing is negative, the possibility of a false negative result should be considered in the context of a patient's recent exposures and the presence of clinical signs and symptoms consistent with COVID-19. An individual without symptoms of COVID-19 and who is not shedding SARS-CoV-2 virus would  expect to have a negative (not detected) result in this assay.   Respiratory Panel by RT PCR (Flu A&B, Covid) - Nasopharyngeal Swab     Status: None   Collection Time: 05/21/19  3:41 PM   Specimen: Nasopharyngeal Swab  Result Value Ref Range Status   SARS Coronavirus 2 by RT PCR NEGATIVE NEGATIVE Final    Comment: (NOTE) SARS-CoV-2 target nucleic acids are NOT DETECTED. The SARS-CoV-2 RNA is generally detectable in upper respiratoy specimens during the acute phase of infection. The lowest concentration of SARS-CoV-2 viral copies this assay can detect  is 131 copies/mL. A negative result does not preclude SARS-Cov-2 infection and should not be used as the sole basis for treatment or other patient management decisions. A negative result may occur with  improper specimen collection/handling, submission of specimen other than nasopharyngeal swab, presence of viral mutation(s) within the areas targeted by this assay, and inadequate number of viral copies (<131 copies/mL). A negative result must be combined with clinical observations, patient history, and epidemiological information. The expected result is Negative. Fact Sheet for Patients:  PinkCheek.be Fact Sheet for Healthcare Providers:  GravelBags.it This test is not yet ap proved or cleared by the [place] FDA and  has been authorized for detection and/or diagnosis of SARS-CoV-2 by FDA under an Emergency Use Authorization (EUA). This EUA will remain  in effect (meaning this test can be used) for the duration of the COVID-19 declaration under Section 564(b)(1) of the Act, 21 U.S.C. section 360bbb-3(b)(1), unless the authorization is terminated or revoked sooner.    Influenza A by PCR NEGATIVE NEGATIVE Final   Influenza B by PCR NEGATIVE NEGATIVE Final    Comment: (NOTE) The Xpert Xpress SARS-CoV-2/FLU/RSV assay is intended as an aid in  the diagnosis of influenza from Nasopharyngeal swab specimens and  should not be used as a sole basis for treatment. Nasal washings and  aspirates are unacceptable for Xpert Xpress SARS-CoV-2/FLU/RSV  testing. Fact Sheet for Patients: PinkCheek.be Fact Sheet for Healthcare Providers: GravelBags.it This test is not yet approved or cleared by the [place] FDA and  has been authorized for detection and/or diagnosis of SARS-CoV-2 by  FDA under an Emergency Use Authorization (EUA). This EUA will remain  in effect (meaning this  test can be used) for the duration of the  Covid-19 declaration under Section 564(b)(1) of the Act, 21  U.S.C. section 360bbb-3(b)(1), unless the authorization is  terminated or revoked. Performed at [facility], [address] [place], [place] [address]  Urine culture     Status: Abnormal   Collection Time: 05/21/19 11:17 PM   Specimen: Urine, Random  Result Value Ref Range Status   Specimen Description   Final    URINE, RANDOM Performed at [facility], [address], [place], [place] [address]    Special Requests   Final    NONE Performed at [facility], [address], [place], [place] [address]    Culture MULTIPLE SPECIES PRESENT, SUGGEST RECOLLECTION (A)  Final   Report Status 05/23/2019 FINAL  Final  SARS CORONAVIRUS 2 (TAT 6-24 HRS) Nasopharyngeal Nasopharyngeal Swab     Status: None   Collection Time: 05/23/19 11:15 PM   Specimen: Nasopharyngeal Swab  Result Value Ref Range Status   SARS Coronavirus 2 NEGATIVE NEGATIVE Final    Comment: (NOTE) SARS-CoV-2 target nucleic acids are NOT DETECTED. The SARS-CoV-2 RNA is generally detectable in upper and lower respiratory specimens during the acute phase of infection. Negative results do not preclude SARS-CoV-2 infection, do not rule out co-infections with other pathogens, and should not be used as the sole basis for treatment or other patient management decisions. Negative results must be combined with clinical observations, patient history, and epidemiological information. The expected result is Negative. Fact Sheet for Patients: SugarRoll.be Fact Sheet for Healthcare Providers: https://www.woods-mathews.com/ This test is not yet approved or cleared by the [place] FDA and  has been authorized for detection and/or diagnosis of SARS-CoV-2 by FDA under an Emergency Use Authorization (EUA). This EUA will remain  in effect (meaning this test can be used) for the duration of  the COVID-19 declaration under Section 56 4(b)(1) of the Act, 21 U.S.C. section 360bbb-3(b)(1), unless the authorization is terminated or revoked sooner. Performed at [facility], [place] [address] [place], [place] [address]   C Difficile Quick Screen w PCR reflex     Status: None   Collection Time: 05/24/19 10:00 PM   Specimen: STOOL  Result Value Ref Range Status   C Diff antigen NEGATIVE NEGATIVE Final   C Diff toxin NEGATIVE NEGATIVE Final   C Diff interpretation No C. difficile detected.  Final    Comment: Performed at [facility], [address] [place], [place] [address]    Procedures and diagnostic studies:  No results found.  Medications:   . ARIPiprazole  10 mg Oral QHS  . atorvastatin  40 mg Oral Daily  . buPROPion  150 mg Oral q morning - 10a  . cephALEXin  500 mg Oral Q12H  . ferrous sulfate  325 mg Oral QODAY  . gabapentin  300 mg Oral QHS  . insulin aspart  0-15 Units Subcutaneous TID WC  . insulin glargine  10 Units Subcutaneous QAC breakfast  . lisinopril  20 mg Oral Daily  . vitamin B-12  1,000 mcg Oral Daily   Continuous Infusions: . pantoprozole (PROTONIX) infusion 8 mg/hr (05/25/19 1828)     LOS: 3 days   Sahara Fujimoto  [facility]   *Please refer to [email or web address], password [email or web address] to get updated schedule on who will round on this patient, as hospitalists switch teams weekly. If 7PM-7AM, please contact night-coverage at [email or web address], password [email or web address] for any overnight needs.  05/26/2019, 11:55 AM

## 2019-05-26 NOTE — Transfer of Care (Signed)
Immediate Anesthesia Transfer of Care Note  Patient: [name]  Procedure(s) Performed: ESOPHAGOGASTRODUODENOSCOPY (EGD) WITH PROPOFOL (N/A ) COLONOSCOPY WITH PROPOFOL (N/A )  Patient Location: PACU  Anesthesia Type:General  Level of Consciousness: sedated  Airway & Oxygen Therapy: Patient Spontanous Breathing  Post-op Assessment: Report given to RN and Post -op Vital signs reviewed and stable  Post vital signs: Reviewed and stable  Last Vitals:  Vitals Value Taken Time  BP 108/66 05/26/19 0903  Temp 36.5 C 05/26/19 0903  Pulse 99 05/26/19 0903  Resp 18 05/26/19 0903  SpO2 96 % 05/26/19 0903  Vitals shown include unvalidated device data.  Last Pain:  Vitals:   05/26/19 0012  TempSrc: Oral  PainSc:          Complications: No apparent anesthesia complications

## 2019-05-26 NOTE — Op Note (Signed)
[facility] Gastroenterology Patient Name: [name] Procedure Date: 05/26/2019 7:31 AM MRN: [id number] Account #: [id number] Date of Birth: 12/13/1965 Admit Type: Inpatient Age: 54 Room: [facility] ENDO ROOM 4 Gender: Female Note Status: Finalized Procedure:             Upper GI endoscopy Indications:           Acute post hemorrhagic anemia, Hematochezia Providers:             Benay Pike. Kelon Easom MD, MD Medicines:             Propofol per Anesthesia Complications:         No immediate complications. Procedure:             Pre-Anesthesia Assessment:                        - The risks and benefits of the procedure and the                         sedation options and risks were discussed with the                         patient. All questions were answered and informed                         consent was obtained.                        - Patient identification and proposed procedure were                         verified prior to the procedure by the nurse. The                         procedure was verified in the procedure room.                        - ASA Grade Assessment: III - A patient with severe                         systemic disease.                        - After reviewing the risks and benefits, the patient                         was deemed in satisfactory condition to undergo the                         procedure.                        After obtaining informed consent, the endoscope was                         passed under direct vision. Throughout the procedure,                         the patient's blood pressure, pulse, and oxygen  saturations were monitored continuously. The Endoscope                         was introduced through the mouth, and advanced to the                         third part of duodenum. The upper GI endoscopy was                         accomplished without difficulty. The patient tolerated                 the procedure well. Findings:      The Z-line was irregular and was found 36 to 37 cm from the incisors.       Mucosa was biopsied with a cold forceps for histology. One specimen       bottle was sent to pathology.      A few 3 to 7 mm sessile polyps with no bleeding and no stigmata of       recent bleeding were found in the gastric fundus. Biopsies were taken       with a cold forceps for histology.      The examined duodenum was normal.      The exam was otherwise without abnormality. Impression:            - Z-line irregular, 36 to 37 cm from the incisors.                         Biopsied.                        - A few gastric polyps. Biopsied.                        - Normal examined duodenum.                        - The examination was otherwise normal. Recommendation:        - Await pathology results.                        - Proceed with colonoscopy Procedure Code(s):     --- Professional ---                        [phone], Esophagogastroduodenoscopy, flexible,                         transoral; with biopsy, single or multiple Diagnosis Code(s):     --- Professional ---                        K92.1, Melena (includes Hematochezia)                        D62, Acute posthemorrhagic anemia                        K31.7, Polyp of stomach and duodenum                        K22.8, Other specified diseases of esophagus CPT copyright 2019 American  Medical Association. All rights reserved. The codes documented in this report are preliminary and upon coder review may  be revised to meet current compliance requirements. Efrain Sella MD, MD 05/26/2019 8:25:57 AM This report has been signed electronically. Number of Addenda: 0 Note Initiated On: 05/26/2019 7:31 AM Estimated Blood Loss:  Estimated blood loss: none.      [facility]

## 2019-05-26 NOTE — Progress Notes (Signed)
Report given to [facility] on floor.

## 2019-05-26 NOTE — Anesthesia Postprocedure Evaluation (Signed)
Anesthesia Post Note  Patient: [name]  Procedure(s) Performed: ESOPHAGOGASTRODUODENOSCOPY (EGD) WITH PROPOFOL (N/A ) COLONOSCOPY WITH PROPOFOL (N/A )  Patient location during evaluation: PACU Anesthesia Type: General Level of consciousness: awake and alert Pain management: pain level controlled Vital Signs Assessment: post-procedure vital signs reviewed and stable Respiratory status: spontaneous breathing, nonlabored ventilation, respiratory function stable and patient connected to nasal cannula oxygen Cardiovascular status: blood pressure returned to baseline and stable Postop Assessment: no apparent nausea or vomiting Anesthetic complications: no     Last Vitals:  Vitals:   05/26/19 0920 05/26/19 0930  BP: 107/72 139/77  Pulse: 98 99  Resp: 20 18  Temp:  (!) 36.2 C  SpO2: 97% 96%    Last Pain:  Vitals:   05/26/19 0930  TempSrc:   PainSc: 0-No pain                 Precious Haws Freya Zobrist

## 2019-05-26 NOTE — Plan of Care (Signed)
Patient denied pain or discomfort after procedures.  Spoke of her conversations with Dr. Alice Reichert and Dr. Mal Misty and her plans for surgery and recovery.  Transfers continue to be well from bed to bsc.

## 2019-05-26 NOTE — Progress Notes (Signed)
Pt transported by transporter to room

## 2019-05-26 NOTE — Consult Note (Signed)
Patient ID: [name], female   DOB: 05-25-1965, 54 y.o.   MRN: [id number]  Chief Complaint/reason for consultation: Partially obstructing sigmoid mass, suspicious for carcinoma.  History of Present Illness [name] is a 54 y.o. female with an admission for concerns regarding her self-care, and some cellulitic changes of her right ankle.  She was found to have a significant and recent drop in her hemoglobin, and admitted for work-up.  Colonoscopy was completed this morning identifying admitted sigmoid mass, partially obstructing/ulcerated extending about 5 cm.  Dr. Alice Reichert inked the location of mass.  She had a recent CTA of the chest which showed enlargement of the liver, it appeared normal.  She denies any significant abdominal pain, or weight loss.  She is relieved to identify the likely focus of her anemia.  Past Medical History Past Medical History:  Diagnosis Date  . Depression   . Diabetes mellitus without complication ([place])   . Hyperlipidemia   . Hypertension   . Neuromuscular disorder ([place])   . Schizo affective schizophrenia ([place])    abilify and pazil      Past Surgical History:  Procedure Laterality Date  . CHOLECYSTECTOMY    . COLONOSCOPY WITH PROPOFOL N/A 05/26/2019   Procedure: COLONOSCOPY WITH PROPOFOL;  Surgeon: Toledo, Benay Pike, MD;  Location: [facility] ENDOSCOPY;  Service: Gastroenterology;  Laterality: N/A;  . ESOPHAGOGASTRODUODENOSCOPY (EGD) WITH PROPOFOL N/A 05/26/2019   Procedure: ESOPHAGOGASTRODUODENOSCOPY (EGD) WITH PROPOFOL;  Surgeon: Toledo, Benay Pike, MD;  Location: [facility] ENDOSCOPY;  Service: Gastroenterology;  Laterality: N/A;  . IRRIGATION AND DEBRIDEMENT FOOT Right 02/26/2016   Procedure: RIGHT 2ND TOE DEBRIDEMENT;  Surgeon: Samara Deist, DPM;  Location: [facility] ORS;  Service: Podiatry;  Laterality: Right;  . IRRIGATION AND DEBRIDEMENT FOOT Left 12/13/2018   Procedure: IRRIGATION AND DEBRIDEMENT FOOT;  Surgeon: Caroline More, DPM;  Location: [facility]  ORS;  Service: Podiatry;  Laterality: Left;    No Known Allergies  Current Facility-Administered Medications  Medication Dose Route Frequency Provider Last Rate Last Admin  . ARIPiprazole (ABILIFY) tablet 10 mg  10 mg Oral QHS Cristescu, Mircea G, MD   10 mg at 05/25/19 2110  . atorvastatin (LIPITOR) tablet 40 mg  40 mg Oral Daily Cristescu, Mircea G, MD   40 mg at 05/26/19 1047  . buPROPion (WELLBUTRIN XL) 24 hr tablet 150 mg  150 mg Oral q morning - 10a Cristescu, Mircea G, MD   150 mg at 05/26/19 1048  . cephALEXin (KEFLEX) capsule 500 mg  500 mg Oral Q12H Jennye Boroughs, MD      . ferrous sulfate tablet 325 mg  325 mg Oral Tonye Pearson, MD      . gabapentin (NEURONTIN) capsule 300 mg  300 mg Oral QHS Jennye Boroughs, MD   300 mg at 05/25/19 2110  . insulin aspart (novoLOG) injection 0-15 Units  0-15 Units Subcutaneous TID WC Cristescu, Linard Millers, MD   2 Units at 05/25/19 1254  . insulin glargine (LANTUS) injection 10 Units  10 Units Subcutaneous QAC breakfast Jennye Boroughs, MD      . iohexol (OMNIPAQUE) 9 MG/ML oral solution 500 mL  500 mL Oral Q1H Jennye Boroughs, MD      . lisinopril (ZESTRIL) tablet 20 mg  20 mg Oral Daily Cristescu, Mircea G, MD   20 mg at 05/26/19 1049  . ondansetron (ZOFRAN) tablet 4 mg  4 mg Oral Q6H PRN Cristescu, Linard Millers, MD       Or  . ondansetron (ZOFRAN) injection  4 mg  4 mg Intravenous Q6H PRN Cristescu, Mircea G, MD      . pantoprazole (PROTONIX) 80 mg in sodium chloride 0.9 % 250 mL (0.32 mg/mL) infusion  8 mg/hr Intravenous Continuous Cristescu, Linard Millers, MD 25 mL/hr at 05/25/19 1828 8 mg/hr at 05/25/19 1828  . vitamin B-12 (CYANOCOBALAMIN) tablet 1,000 mcg  1,000 mcg Oral Daily Jennye Boroughs, MD        Family History Family History  Problem Relation Age of Onset  . Breast cancer Cousin        maternal cousin  . Hypertension Mother   . Hyperthyroidism Mother   . Diabetes Maternal Grandmother   . Hypertension Maternal Grandmother   . Cancer  Maternal Grandmother        unknown type  . Diabetes Maternal Grandfather   . Hypertension Maternal Grandfather   . Diabetes Paternal Grandmother   . Hypertension Paternal Grandmother   . Diabetes Paternal Grandfather   . Hypertension Paternal Grandfather       Social History Social History   Tobacco Use  . Smoking status: Former Smoker    Packs/day: 3.00    Years: 30.00    Pack years: 90.00    Types: Cigarettes    Quit date: 05/02/2009    Years since quitting: 10.0  . Smokeless tobacco: Former Systems developer  . Tobacco comment: Pt reported  quitting in 2011  Substance Use Topics  . Alcohol use: No  . Drug use: No        Review of Systems  Constitutional: Negative.   HENT: Negative.   Eyes: Negative.   Respiratory: Negative.   Cardiovascular: Negative.   Gastrointestinal: Positive for blood in stool and nausea. Negative for abdominal pain, melena and vomiting.  Skin: Negative for itching and rash.  Neurological: Positive for sensory change and weakness.  Endo/Heme/Allergies: Bruises/bleeds easily.      Physical Exam Blood pressure (!) 157/106, pulse (!) 107, temperature (!) 97.1 F (36.2 C), resp. rate 18, height 5\' 10"  (1.778 m), weight 119.5 kg, SpO2 97 %. Last Weight  Most recent update: 05/24/2019  2:57 AM   Weight  119.5 kg (263 lb 8 oz)            CONSTITUTIONAL: Well developed, and nourished, appropriately responsive and aware without distress.   EYES: Sclera non-icteric.   EARS, NOSE, MOUTH AND THROAT: Mask worn.    Hearing is intact to voice.  NECK: Trachea is midline, and there is no jugular venous distension.  LYMPH NODES:  Lymph nodes in the neck are not enlarged. RESPIRATORY:  Lungs are clear, and breath sounds are equal bilaterally. Normal respiratory effort without pathologic use of accessory muscles. CARDIOVASCULAR: Heart is regular in rate and rhythm. GI: The abdomen is obese, soft, nontender, and nondistended. There were no palpable masses.   Well-healed prior surgical scars.  I did not appreciate hepatosplenomegaly. There were normal bowel sounds.  MUSCULOSKELETAL:  Symmetrical muscle tone appreciated in all four extremities.   Charcot foot deformity left foot. SKIN: Skin turgor is normal. No pathologic skin lesions appreciated.  No appreciable cellulitis present right proximal ankle. NEUROLOGIC:  Motor appears grossly normal.  Cranial nerves are grossly without defect. PSYCH:  Alert and oriented to person, place and time. Affect is appropriate for situation.  Data Reviewed I have personally reviewed what is currently available of the patient's imaging, recent labs and medical records.   Labs:  CBC Latest Ref Rng & Units 05/26/2019 05/25/2019 05/24/2019  WBC 4.0 -  10.5 K/uL 10.0 8.7 8.7  Hemoglobin 12.0 - 15.0 g/dL 8.7(L) 7.9(L) 8.0(L)  Hematocrit 36.0 - 46.0 % 29.0(L) 26.2(L) 25.6(L)  Platelets 150 - 400 K/uL 399 306 316   CMP Latest Ref Rng & Units 05/25/2019 05/23/2019 05/22/2019  Glucose 70 - 99 mg/dL 129(H) 161(H) 224(H)  BUN 6 - 20 mg/dL 13 16 14   Creatinine 0.44 - 1.00 mg/dL 1.13(H) 1.44(H) 1.30(H)  Sodium 135 - 145 mmol/L 136 129(L) 127(L)  Potassium 3.5 - 5.1 mmol/L 4.8 4.4 4.3  Chloride 98 - 111 mmol/L 105 95(L) 91(L)  CO2 22 - 32 mmol/L 25 24 23   Calcium 8.9 - 10.3 mg/dL 9.2 9.0 9.1  Total Protein 6.5 - 8.1 g/dL - - 6.6  Total Bilirubin 0.3 - 1.2 mg/dL - - 0.5  Alkaline Phos 38 - 126 U/L - - 102  AST 15 - 41 U/L - - 25  ALT 0 - 44 U/L - - 34   Pathology pending.    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 Patient Active Problem List   Diagnosis Date Noted  . Colonic mass 05/26/2019  . Anemia   . Cellulitis in diabetic foot ([place]) 05/23/2019  . Hyponatremia 05/23/2019  . GI bleed 05/23/2019  . Cellulitis 05/23/2019  . Neuropathic foot ulcer, left, with fat layer exposed ([place]) 02/05/2019  . Hyperkalemia 02/05/2019  . Osteomyelitis  ([place]) 12/11/2018  . Pyelonephritis 12/14/2017  . Morbid obesity ([place]) 05/10/2017  . Long-term use of high-risk medication 05/10/2017  . Hyperlipidemia associated with type 2 diabetes mellitus ([place]) 06/27/2016  . Hammer toe of second toe of right foot 04/20/2016  . Essential hypertension 06/29/2015  . Schizoaffective disorder ([place]) 06/29/2015  . DM (diabetes mellitus), type 2, 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[place]) 56/25/6389  . Polyneuropathy 04/02/2014    Plan    We discussed sigmoid colectomy, efforts to utilize minimally invasive surgery and robotic assisted laparoscopic sigmoid colectomy discussed.   Risks of the procedure include but are not limited to anesthesia, bleeding, infection, anastomotic dehiscence, wound infections/dehiscence, chronic pain, etc.  I believe she understands these are not all inclusive, she desires to proceed.  Questions answered.  No guarantees were ever expressed or implied.  Face-to-face time spent with the patient and accompanying care providers(if present) was 30 minutes, with more than 50% of the time spent counseling, educating, and coordinating care of the patient.      Ronny Bacon M.D., FACS 05/26/2019, 12:43 PM

## 2019-05-26 NOTE — Op Note (Addendum)
[facility] Gastroenterology Patient Name: [name] Procedure Date: 05/26/2019 7:31 AM MRN: [id number] Account #: [id number] Date of Birth: [birth year] Admit Type: Inpatient Age: 54 Room: [facility] ENDO ROOM 4 Gender: Female Note Status: Finalized Procedure:             Colonoscopy Indications:           Heme positive stool, Iron deficiency anemia secondary                         to chronic blood loss Providers:             Benay Pike. Katharyn Schauer MD, MD Medicines:             Propofol per Anesthesia Complications:         No immediate complications. Estimated blood loss:                         Minimal. Procedure:             Pre-Anesthesia Assessment:                        - The risks and benefits of the procedure and the                         sedation options and risks were discussed with the                         patient. All questions were answered and informed                         consent was obtained.                        - Patient identification and proposed procedure were                         verified prior to the procedure by the nurse. The                         procedure was verified in the procedure room.                        - ASA Grade Assessment: III - A patient with severe                         systemic disease.                        - After reviewing the risks and benefits, the patient                         was deemed in satisfactory condition to undergo the                         procedure.                        After obtaining informed consent, the colonoscope was  passed under direct vision. Throughout the procedure,                         the patient's blood pressure, pulse, and oxygen                         saturations were monitored continuously. The                         Colonoscope was introduced through the anus and                         advanced to the the cecum, identified by  appendiceal                         orifice and ileocecal valve. The colonoscopy was                         somewhat difficult due to a partially obstructing                         mass. Successful completion of the procedure was aided                         by withdrawing and reinserting the scope. The patient                         tolerated the procedure well. The quality of the bowel                         preparation was excellent. Findings:      The perianal and digital rectal examinations were normal. Pertinent       negatives include normal sphincter tone and no palpable rectal lesions.      A polypoid, sessile and ulcerated partially obstructing medium-sized       mass was found in the mid sigmoid colon. The mass was       non-circumferential. The mass measured five cm in length. In addition,       its diameter measured three mm. No bleeding was present. Biopsies were       taken with a cold forceps for histology. Area was tattooed with an       injection of 4 mL of Spot (carbon black). Injected 4 cc proximal to (1cc       each quadrant) proximal to the mass. Area was tattooed with an injection       of 4 mL of Spot (carbon black). Injected 4cc (1cc each quadrant DISTAL       to the mass)      Total of 8cc of SPOT carbon black used for tattooing area of lesion.       Estimated blood loss was minimal.      The exam was otherwise without abnormality on direct and retroflexion       views. Impression:            - Likely malignant partially obstructing tumor in the                         mid sigmoid colon. Biopsied. Tattooed.                        -  The examination was otherwise normal on direct and                         retroflexion views. Recommendation:        - Return patient to hospital ward for ongoing care.                        - Refer to a surgeon in 7 days.                        - Clear liquid diet.                        - The findings and recommendations  were discussed with                         the patient. Procedure Code(s):     --- Professional ---                        [phone], Colonoscopy, flexible; with directed submucosal                         injection(s), any substance                        [id number], Colonoscopy, flexible; with biopsy, single or                         multiple Diagnosis Code(s):     --- Professional ---                        D50.0, Iron deficiency anemia secondary to blood loss                         (chronic)                        R19.5, Other fecal abnormalities                        K56.690, Other partial intestinal obstruction                        D49.0, Neoplasm of unspecified behavior of digestive                         system CPT copyright 2019 American Medical Association. All rights reserved. The codes documented in this report are preliminary and upon coder review may  be revised to meet current compliance requirements. Efrain Sella MD, MD 05/26/2019 9:02:18 AM This report has been signed electronically. Number of Addenda: 0 Note Initiated On: 05/26/2019 7:31 AM Scope Withdrawal Time: 0 hours 14 minutes 21 seconds  Total Procedure Duration: 0 hours 24 minutes 27 seconds  Estimated Blood Loss:  Estimated blood loss was minimal.      [facility]

## 2019-05-26 NOTE — Interval H&P Note (Signed)
History and Physical Interval Note:  05/26/2019 8:08 AM  [name]  has presented today for surgery, with the diagnosis of Anemia, heme postive stool, nausea, bloating, diarrhea.  The various methods of treatment have been discussed with the patient and family. After consideration of risks, benefits and other options for treatment, the patient has consented to  Procedure(s): ESOPHAGOGASTRODUODENOSCOPY (EGD) WITH PROPOFOL (N/A) COLONOSCOPY WITH PROPOFOL (N/A) as a surgical intervention.  The patient's history has been reviewed, patient examined, no change in status, stable for surgery.  I have reviewed the patient's chart and labs.  Questions were answered to the patient's satisfaction.     [place], [place]

## 2019-05-27 ENCOUNTER — Encounter: Payer: Self-pay | Admitting: Emergency Medicine

## 2019-05-27 ENCOUNTER — Telehealth: Payer: Self-pay

## 2019-05-27 ENCOUNTER — Ambulatory Visit: Payer: Medicare Other | Admitting: Family Medicine

## 2019-05-27 LAB — GI PATHOGEN PANEL BY PCR, STOOL
Adenovirus F 40/41: NOT DETECTED
Astrovirus: NOT DETECTED
Campylobacter by PCR: NOT DETECTED
Cryptosporidium by PCR: DETECTED — AB
Cyclospora cayetanensis: NOT DETECTED
E coli (ETEC) LT/ST: NOT DETECTED
E coli (STEC): NOT DETECTED
Entamoeba histolytica: NOT DETECTED
Enteroaggregative E coli: NOT DETECTED
Enteropathogenic E coli: NOT DETECTED
G lamblia by PCR: NOT DETECTED
Norovirus GI/GII: NOT DETECTED
Plesiomonas shigelloides: NOT DETECTED
Rotavirus A by PCR: NOT DETECTED
Salmonella by PCR: NOT DETECTED
Sapovirus: NOT DETECTED
Shigella by PCR: NOT DETECTED
Vibrio cholerae: NOT DETECTED
Vibrio: NOT DETECTED
Yersinia enterocolitica: NOT DETECTED

## 2019-05-27 LAB — GLUCOSE, CAPILLARY
Glucose-Capillary: 99 mg/dL (ref 70–99)
Glucose-Capillary: 193 mg/dL — ABNORMAL HIGH (ref 70–99)
Glucose-Capillary: 117 mg/dL — ABNORMAL HIGH (ref 70–99)
Glucose-Capillary: 125 mg/dL — ABNORMAL HIGH (ref 70–99)

## 2019-05-27 MED ORDER — NEOMYCIN SULFATE 500 MG PO TABS
1000.0000 mg | ORAL_TABLET | Freq: Three times a day (TID) | ORAL | Status: DC
  Administered 2019-05-27 (×2): 1000 mg via ORAL
  Filled 2019-05-27 (×3): qty 2

## 2019-05-27 MED ORDER — METRONIDAZOLE 500 MG PO TABS
500.0000 mg | ORAL_TABLET | Freq: Three times a day (TID) | ORAL | Status: DC
  Administered 2019-05-27 (×2): 500 mg via ORAL
  Filled 2019-05-27 (×3): qty 1

## 2019-05-27 MED ORDER — PEG 3350-KCL-NA BICARB-NACL 420 G PO SOLR
4000.0000 mL | Freq: Once | ORAL | Status: AC
  Administered 2019-05-27: 4000 mL via ORAL
  Filled 2019-05-27: qty 4000

## 2019-05-27 NOTE — Progress Notes (Signed)
   05/27/19 1700  Clinical Encounter Type  Visited With Patient  Visit Type Initial  Referral From Nurse  Consult/Referral To Chaplain  Spiritual Encounters  Spiritual Needs Prayer;Emotional  Stress Factors  Patient Stress Factors Health changes;Major life changes  Chaplain visited with patient who was sitting up. Patient advised chaplain that she was undergoing surgery on the following day. Patient was optimistic that the Reita Cliche would take care of her and she is leaving everything in the Lord's hands. Patient smiled throughout the conversation and was very talkative. Patient solicited prayers for her and her mother who has dementia. Patient was more worried about mother than her own condition. Chaplain offered up prayer to patient.

## 2019-05-27 NOTE — Progress Notes (Signed)
[facility] SURGICAL PROGRESS NOTE (cpt [id number])  Hospital Day(s): 4.   Post op day(s): 1 Day Post-Op.   Interval History: Patient seen and examined, no acute events or new complaints overnight. Patient reports feeling better. Unfortunately she was eating regular carb modified diet. No abdominal pain, nausea, emesis, fever, or chills. CEA pending. Plan for sigmoid colectomy tomorrow (01/26).   Review of Systems:  Constitutional: denies fever, chills  HEENT: denies cough or congestion  Respiratory: denies any shortness of breath  Cardiovascular: denies chest pain or palpitations  Gastrointestinal: denies abdominal pain, N/V, or diarrhea/and bowel function as per interval history Genitourinary: denies burning with urination or urinary frequency   Vital signs in last 24 hours: [min-max] current  Temp:  [97.1 F (36.2 C)-98.2 F (36.8 C)] 98.2 F (36.8 C) (01/24 2304) Pulse Rate:  [98-109] 106 (01/24 2304) Resp:  [16-20] 18 (01/24 2304) BP: (106-157)/(66-106) 134/72 (01/24 2304) SpO2:  [96 %-100 %] 97 % (01/24 2304)     Height: 5\' 10"  (177.8 cm) Weight: 119.5 kg BMI (Calculated): 37.81   Intake/Output last 2 shifts:  01/24 0701 - 01/25 0700 In: 800 [I.V.:800] Out: -    Physical Exam:  Constitutional: alert, cooperative and no distress  HENT: normocephalic without obvious abnormality, poor dentition  Eyes: PERRL, EOM's grossly intact and symmetric  Respiratory: breathing non-labored at rest  Cardiovascular: regular rate and sinus rhythm  Gastrointestinal: soft, non-tender, and non-distended Musculoskeletal: UE and LE FROM, no edema or wounds, motor and sensation grossly intact, NT    Labs:  CBC Latest Ref Rng & Units 05/26/2019 05/25/2019 05/24/2019  WBC 4.0 - 10.5 K/uL 10.0 8.7 8.7  Hemoglobin 12.0 - 15.0 g/dL 8.7(L) 7.9(L) 8.0(L)  Hematocrit 36.0 - 46.0 % 29.0(L) 26.2(L) 25.6(L)  Platelets 150 - 400 K/uL 399 306 316   CMP Latest Ref Rng & Units 05/25/2019  05/23/2019 05/22/2019  Glucose 70 - 99 mg/dL 129(H) 161(H) 224(H)  BUN 6 - 20 mg/dL 13 16 14   Creatinine 0.44 - 1.00 mg/dL 1.13(H) 1.44(H) 1.30(H)  Sodium 135 - 145 mmol/L 136 129(L) 127(L)  Potassium 3.5 - 5.1 mmol/L 4.8 4.4 4.3  Chloride 98 - 111 mmol/L 105 95(L) 91(L)  CO2 22 - 32 mmol/L 25 24 23   Calcium 8.9 - 10.3 mg/dL 9.2 9.0 9.1  Total Protein 6.5 - 8.1 g/dL - - 6.6  Total Bilirubin 0.3 - 1.2 mg/dL - - 0.5  Alkaline Phos 38 - 126 U/L - - 102  AST 15 - 41 U/L - - 25  ALT 0 - 44 U/L - - 34     Imaging studies: No new pertinent imaging studies   Assessment/Plan: (ICD-10's: K63.89) 54 y.o. female with near-obstructing partially ulcerated sigmoid mass concerning for malignancy   - CLD today; NPO after midnight  - Will need to redo bowel prep today in anticipation of surgery tomorrow    - Plan for Robotic assisted laparoscopic sigmoid colectomy tomorrow with Dr Christian Mate  - Consent obtained at initial consult   - further management per primary service   All of the above findings and recommendations were discussed with the patient, and the medical team, and all of patient's questions were answered to her expressed satisfaction.  -- Edison Simon, PA-C [facility] 05/27/2019, 7:26 AM [phone] M-F: 7am - 4pm

## 2019-05-27 NOTE — Progress Notes (Addendum)
Progress Note    VALECIA BESKE  IDP:[id number] DOB: [birth year]  DOA: 05/23/2019 PCP: [name], DO      Brief Narrative:    Medical records reviewed and are as summarized below:  [name] is an 54 y.o. female with medical history significant of diabetes mellitus type 2, hypertension, hyperlipidemia, Charcot foot, schizoaffective disorders, came to emergency room accompanied by daughter because of concern for possible sepsis right lower leg cellulitis.  Patient was diagnosed with cellulitis few days ago and was started on antibiotics, Keflex. Patient had had recent emergency department visits for both cellulitis as well as concern about patient's ability to care for self.      Assessment/Plan:   Active Problems:   DM (diabetes mellitus), type 2, 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HCC)   Polyneuropathy   Essential hypertension   Schizoaffective disorder (HCC)   Hammer toe of second toe of right foot   Hyperlipidemia associated with type 2 diabetes mellitus ([place])   Neuropathic foot ulcer, left, with fat layer exposed ([place])   Cellulitis in diabetic foot (HCC)   Hyponatremia   GI bleed   Cellulitis   Colonic mass   Body mass index is 37.81 kg/m.  (Morbid obesity)  Right leg cellulitis: Improved.  Continue Keflex  Acute on chronic anemia, likely due to GI bleed: Monitor H&H.  No indication for blood transfusion at this time.  Low normal vitamin B12, iron and ferritin levels.  Start iron and B12 supplements   Colonic mass/occult GI bleeding: s/p EGD and colonoscopy on 05/26/2019.  EGD showed gastric polyps that were biopsied and colonoscopy showed polypoid, sessile nonulcerated partially obstructing medium-sized mass in the mid sigmoid colon that was biopsied.  Patient is scheduled for surgery tomorrow for colon mass.  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  NovoLog as needed for hyperglycemia.  Continue gabapentin.  Hypertension: Continue antihypertensives  Hyponatremia: This is chronic/fluctuating.  Improved.  Schizoaffective disorder: Continue psychotropics.   Family Communication/Anticipated D/C date and plan/Code Status   DVT prophylaxis: SCDs Code Status: Full code Family Communication: Plan discussed with patient and her daughter who was on speaker phone during the discussion.   Disposition Plan: To be determined after surgery.    Subjective:   No abdominal pain pain, vomiting or diarrhea.   Objective:    Vitals:   05/26/19 2113 05/26/19 2304 05/26/19 2304 05/27/19 0826  BP:  134/72 134/72 124/76  Pulse: (!) 108 (!) 109 (!) 106 100  Resp:  18 18 17   Temp:  98.2 F (36.8 C) 98.2 F (36.8 C) 98 F (36.7 C)  TempSrc:  Oral Oral   SpO2:  97% 97% 99%  Weight:      Height:       No intake or output data in the 24 hours ending 05/27/19 1457 Filed Weights   05/23/19 1318 05/24/19 0215  Weight: 120.2 kg 119.5 kg    Exam:  GEN: NAD SKIN: No rash EYES: No pallor; anicteric ENT: MMM CV: RRR PULM: CTA B ABD: soft, obese, NT, +BS CNS: AAO x 3, non focal EXT: Redness and tenderness on right leg has improved.  There is mild swelling of the right leg.  Charcot foot, left    Data Reviewed:   I have personally reviewed following labs and imaging studies:  Labs: Labs show the following:   Basic Metabolic Panel: Recent Labs  Lab 05/21/19 1557 05/21/19 1557 05/22/19 1545  05/22/19 1545 05/23/19 1330 05/25/19 0724  NA 126*  --  127*  --  129* 136  K 4.9   < > 4.3   < > 4.4 4.8  CL 92*  --  91*  --  95* 105  CO2 22  --  23  --  24 25  GLUCOSE 133*  --  224*  --  161* 129*  BUN 16  --  14  --  16 13  CREATININE 1.35*  --  1.30*  --  1.44* 1.13*  CALCIUM 9.4  --  9.1  --  9.0 9.2   < > = values in this interval not displayed.   GFR Estimated Creatinine Clearance: 78.9 mL/min (A) (by C-G  formula based on SCr of 1.13 mg/dL (H)). Liver Function Tests: Recent Labs  Lab 05/21/19 1557 05/22/19 1545  AST 28 25  ALT 39 34  ALKPHOS 114 102  BILITOT 0.5 0.5  PROT 7.6 6.6  ALBUMIN 3.7 3.2*   No results for input(s): LIPASE, AMYLASE in the last 168 hours. No results for input(s): AMMONIA in the last 168 hours. Coagulation profile No results for input(s): INR, PROTIME in the last 168 hours.  CBC: Recent Labs  Lab 05/21/19 1557 05/21/19 1557 05/22/19 1545 05/23/19 1330 05/24/19 0427 05/25/19 0724 05/26/19 0540  WBC 8.3   < > 7.6 8.5 8.7 8.7 10.0  NEUTROABS 6.4  --   --   --  5.4  --   --   HGB 10.2*   < > 8.8* 8.2* 8.0* 7.9* 8.7*  HCT 33.2*   < > 28.7* 26.5* 25.6* 26.2* 29.0*  MCV 79.0*   < > 79.9* 77.5* 77.8* 79.2* 79.0*  PLT 364   < > 337 309 316 306 399   < > = values in this interval not displayed.   Cardiac Enzymes: No results for input(s): CKTOTAL, CKMB, CKMBINDEX, TROPONINI in the last 168 hours. BNP (last 3 results) No results for input(s): PROBNP in the last 8760 hours. CBG: Recent Labs  Lab 05/26/19 0727 05/26/19 0920 05/26/19 1134 05/27/19 0828 05/27/19 1139  GLUCAP 84 91 92 99 193*   D-Dimer: No results for input(s): DDIMER in the last 72 hours. Hgb A1c: No results for input(s): HGBA1C in the last 72 hours. Lipid Profile: No results for input(s): CHOL, HDL, LDLCALC, TRIG, CHOLHDL, LDLDIRECT in the last 72 hours. Thyroid function studies: No results for input(s): TSH, T4TOTAL, T3FREE, THYROIDAB in the last 72 hours.  Invalid input(s): FREET3 Anemia work up: Recent Labs    05/25/19 0724 05/25/19 0939 05/26/19 1209  VITAMINB12 212  --  264  FERRITIN  --  42  --   TIBC  --  314  --   IRON  --  31  --    Sepsis Labs: Recent Labs  Lab 05/21/19 1557 05/22/19 1545 05/23/19 1330 05/23/19 1644 05/24/19 0427 05/25/19 0724 05/26/19 0540  PROCALCITON 0.65  --   --   --   --   --   --   WBC 8.3   < > 8.5  --  8.7 8.7 10.0   LATICACIDVEN  --   --  1.8 1.6  --   --   --    < > = values in this interval not displayed.    Microbiology Recent Results (from the past 240 hour(s))  Novel Coronavirus, NAA ([facility])     Status: None   Collection Time: 05/20/19  9:46 AM   Specimen:  Nasopharyngeal(NP) swabs in vial transport medium   NASOPHARYNGE  TESTING  Result Value Ref Range Status   SARS-CoV-2, NAA Not Detected Not Detected Final    Comment: This nucleic acid amplification test was developed and its performance characteristics determined by [employer]. Nucleic acid amplification tests include PCR and TMA. This test has not been FDA cleared or approved. This test has been authorized by FDA under an Emergency Use Authorization (EUA). This test is only authorized for the duration of time the declaration that circumstances exist justifying the authorization of the emergency use of in vitro diagnostic tests for detection of SARS-CoV-2 virus and/or diagnosis of COVID-19 infection under section 564(b)(1) of the Act, 21 U.S.C. 993TTS-1(X) (1), unless the authorization is terminated or revoked sooner. When diagnostic testing is negative, the possibility of a false negative result should be considered in the context of a patient's recent exposures and the presence of clinical signs and symptoms consistent with COVID-19. An individual without symptoms of COVID-19 and who is not shedding SARS-CoV-2 virus would  expect to have a negative (not detected) result in this assay.   Respiratory Panel by RT PCR (Flu A&B, Covid) - Nasopharyngeal Swab     Status: None   Collection Time: 05/21/19  3:41 PM   Specimen: Nasopharyngeal Swab  Result Value Ref Range Status   SARS Coronavirus 2 by RT PCR NEGATIVE NEGATIVE Final    Comment: (NOTE) SARS-CoV-2 target nucleic acids are NOT DETECTED. The SARS-CoV-2 RNA is generally detectable in upper respiratoy specimens during the acute phase of infection. The  lowest concentration of SARS-CoV-2 viral copies this assay can detect is 131 copies/mL. A negative result does not preclude SARS-Cov-2 infection and should not be used as the sole basis for treatment or other patient management decisions. A negative result may occur with  improper specimen collection/handling, submission of specimen other than nasopharyngeal swab, presence of viral mutation(s) within the areas targeted by this assay, and inadequate number of viral copies (<131 copies/mL). A negative result must be combined with clinical observations, patient history, and epidemiological information. The expected result is Negative. Fact Sheet for Patients:  PinkCheek.be Fact Sheet for Healthcare Providers:  GravelBags.it This test is not yet ap proved or cleared by the [place] FDA and  has been authorized for detection and/or diagnosis of SARS-CoV-2 by FDA under an Emergency Use Authorization (EUA). This EUA will remain  in effect (meaning this test can be used) for the duration of the COVID-19 declaration under Section 564(b)(1) of the Act, 21 U.S.C. section 360bbb-3(b)(1), unless the authorization is terminated or revoked sooner.    Influenza A by PCR NEGATIVE NEGATIVE Final   Influenza B by PCR NEGATIVE NEGATIVE Final    Comment: (NOTE) The Xpert Xpress SARS-CoV-2/FLU/RSV assay is intended as an aid in  the diagnosis of influenza from Nasopharyngeal swab specimens and  should not be used as a sole basis for treatment. Nasal washings and  aspirates are unacceptable for Xpert Xpress SARS-CoV-2/FLU/RSV  testing. Fact Sheet for Patients: PinkCheek.be Fact Sheet for Healthcare Providers: GravelBags.it This test is not yet approved or cleared by the [place] FDA and  has been authorized for detection and/or diagnosis of SARS-CoV-2 by  FDA under an Emergency  Use Authorization (EUA). This EUA will remain  in effect (meaning this test can be used) for the duration of the  Covid-19 declaration under Section 564(b)(1) of the Act, 21  U.S.C. section 360bbb-3(b)(1), unless the authorization is  terminated or revoked. Performed  at [facility], [address] [place], [place] [address]   Urine culture     Status: Abnormal   Collection Time: 05/21/19 11:17 PM   Specimen: Urine, Random  Result Value Ref Range Status   Specimen Description   Final    URINE, RANDOM Performed at [facility], [address], [place], [place] [address]    Special Requests   Final    NONE Performed at [facility], [address], [place], [place] [address]    Culture MULTIPLE SPECIES PRESENT, SUGGEST RECOLLECTION (A)  Final   Report Status 05/23/2019 FINAL  Final  SARS CORONAVIRUS 2 (TAT 6-24 HRS) Nasopharyngeal Nasopharyngeal Swab     Status: None   Collection Time: 05/23/19 11:15 PM   Specimen: Nasopharyngeal Swab  Result Value Ref Range Status   SARS Coronavirus 2 NEGATIVE NEGATIVE Final    Comment: (NOTE) SARS-CoV-2 target nucleic acids are NOT DETECTED. The SARS-CoV-2 RNA is generally detectable in upper and lower respiratory specimens during the acute phase of infection. Negative results do not preclude SARS-CoV-2 infection, do not rule out co-infections with other pathogens, and should not be used as the sole basis for treatment or other patient management decisions. Negative results must be combined with clinical observations, patient history, and epidemiological information. The expected result is Negative. Fact Sheet for Patients: SugarRoll.be Fact Sheet for Healthcare Providers: https://www.woods-mathews.com/ This test is not yet approved or cleared by the [place] FDA and  has been authorized for detection and/or diagnosis of SARS-CoV-2 by FDA under an Emergency Use Authorization (EUA). This EUA will  remain  in effect (meaning this test can be used) for the duration of the COVID-19 declaration under Section 56 4(b)(1) of the Act, 21 U.S.C. section 360bbb-3(b)(1), unless the authorization is terminated or revoked sooner. Performed at [facility], [place] [address] [place], [place] [address]   GI pathogen panel by PCR, stool     Status: Abnormal   Collection Time: 05/24/19 10:00 PM   Specimen: Stool  Result Value Ref Range Status   Plesiomonas shigelloides NOT DETECTED NOT DETECTED Final   Yersinia enterocolitica NOT DETECTED NOT DETECTED Final   Vibrio NOT DETECTED NOT DETECTED Final   Enteropathogenic E coli NOT DETECTED NOT DETECTED Final   E coli (ETEC) LT/ST NOT DETECTED NOT DETECTED Final   E coli 8315 by PCR Not applicable NOT DETECTED Final   Cryptosporidium by PCR DETECTED (A) NOT DETECTED Final   Entamoeba histolytica NOT DETECTED NOT DETECTED Final   Adenovirus F 40/41 NOT DETECTED NOT DETECTED Final   Norovirus GI/GII NOT DETECTED NOT DETECTED Final   Sapovirus NOT DETECTED NOT DETECTED Final    Comment: (NOTE) Performed At: [facility] [place], [place] [id number] Rush Farmer MD TG:[id number]    Vibrio cholerae NOT DETECTED NOT DETECTED Final   Campylobacter by PCR NOT DETECTED NOT DETECTED Final   Salmonella by PCR NOT DETECTED NOT DETECTED Final   E coli (STEC) NOT DETECTED NOT DETECTED Final   Enteroaggregative E coli NOT DETECTED NOT DETECTED Final   Shigella by PCR NOT DETECTED NOT DETECTED Final   Cyclospora cayetanensis NOT DETECTED NOT DETECTED Final   Astrovirus NOT DETECTED NOT DETECTED Final   G lamblia by PCR NOT DETECTED NOT DETECTED Final   Rotavirus A by PCR NOT DETECTED NOT DETECTED Final  C Difficile Quick Screen w PCR reflex     Status: None   Collection Time: 05/24/19 10:00 PM   Specimen:  STOOL  Result Value Ref Range Status   C Diff antigen NEGATIVE NEGATIVE Final   C Diff toxin NEGATIVE NEGATIVE Final   C  Diff interpretation No C. difficile detected.  Final    Comment: Performed at [facility], [address] [place], [place] [address]    Procedures and diagnostic studies:  CT ABDOMEN PELVIS W CONTRAST  Result Date: 05/26/2019 CLINICAL DATA:  Colorectal cancer staging, sigmoid mass on colonoscopy today RIGHT lower quadrant pain and loss of appetite for 2 weeks EXAM: CT ABDOMEN AND PELVIS WITH CONTRAST TECHNIQUE: Multidetector CT imaging of the abdomen and pelvis was performed using the standard protocol following bolus administration of intravenous contrast. Sagittal and coronal MPR images reconstructed from axial data set. CONTRAST:  143mL OMNIPAQUE IOHEXOL 300 MG/ML SOLN IV. No oral contrast was administered COMPARISON:  CT angio chest 05/21/2019, CT abdomen and pelvis 08/19/2012 FINDINGS: Lower chest: Minimal subsegmental atelectasis LEFT lower lobe Hepatobiliary: Gallbladder surgically absent. Liver normal appearance. No definite hepatic mass lesion or nodularity. Pancreas: Normal appearance Spleen: Normal appearance Adrenals/Urinary Tract: LEFT adrenal nodule 18 x 13 mm image 29 previously 18 x 11 mm. RIGHT adrenal gland unremarkable. Kidneys, ureters, and bladder normal appearance Stomach/Bowel: Normal appendix. Slight thickening of the sigmoid colon approximately 3 cm transverse over approximately 4 cm length question neoplasm identified at colonoscopy. Minimal adjacent edema, nonspecific, could potentially be related to preceding endoscopic procedure. No other focal colonic abnormalities seen. Stomach and small bowel loops normal appearance. Vascular/Lymphatic: Mild atherosclerotic calcifications aorta without aneurysm. No adenopathy. Reproductive: Normal appearance of uterus and LEFT ovary. Fat containing mass in RIGHT adnexa consistent with dermoid tumor, 4.2 x 3.1 x 3.5 cm image 86. Other: No free air or free fluid. No hernia or inflammatory process. Musculoskeletal: Unremarkable  IMPRESSION: Thickened segment of sigmoid colon approximately 3 cm diameter by 4 cm length question neoplasm identified by colonoscopy. Nonspecific mild infiltration of pericolic fat adjacent to the thickened sigmoid segment, nonspecific in the setting of preceding endoscopy. 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 Stable LEFT adrenal nodule since 2014 favors a benign etiology. No definite evidence of metastatic disease. Aortic Atherosclerosis (ICD10-I70.0). Electronically Signed   By: Lavonia Dana M.D.   On: 05/26/2019 17:53    Medications:   . ARIPiprazole  10 mg Oral QHS  . atorvastatin  40 mg Oral Daily  . buPROPion  150 mg Oral q morning - 10a  . cephALEXin  500 mg Oral Q12H  . ferrous sulfate  325 mg Oral QODAY  . gabapentin  300 mg Oral QHS  . insulin aspart  0-15 Units Subcutaneous TID WC  . insulin glargine  10 Units Subcutaneous QAC breakfast  . lisinopril  20 mg Oral Daily  . metroNIDAZOLE  500 mg Oral Q8H  . neomycin  1,000 mg Oral Q8H  . polyethylene glycol-electrolytes  4,000 mL Oral Once  . vitamin B-12  1,000 mcg Oral Daily   Continuous Infusions:    LOS: 4 days   Zyionna Pesce  [facility]   *Please refer to [email or web address], password [email or web address] to get updated schedule on who will round on this patient, as hospitalists switch teams weekly. If 7PM-7AM, please contact night-coverage at [email or web address], password [email or web address] for any overnight needs.  05/27/2019, 2:57 PM

## 2019-05-28 ENCOUNTER — Encounter
Admission: EM | Disposition: A | Discharge status: Skilled Nursing Facility | Payer: Self-pay | Source: Home / Self Care | Attending: Surgery

## 2019-05-28 ENCOUNTER — Inpatient Hospital Stay: Payer: Medicare Other | Admitting: Anesthesiology

## 2019-05-28 ENCOUNTER — Ambulatory Visit: Payer: Self-pay | Admitting: Pharmacist

## 2019-05-28 ENCOUNTER — Other Ambulatory Visit: Payer: Self-pay | Admitting: Anatomic Pathology & Clinical Pathology

## 2019-05-28 ENCOUNTER — Encounter: Payer: Self-pay | Admitting: Emergency Medicine

## 2019-05-28 DIAGNOSIS — C187 Malignant neoplasm of sigmoid colon: Secondary | ICD-10-CM

## 2019-05-28 DIAGNOSIS — L03115 Cellulitis of right lower limb: Principal | ICD-10-CM

## 2019-05-28 DIAGNOSIS — E1149 Type 2 diabetes mellitus with other diabetic neurological complication: Secondary | ICD-10-CM

## 2019-05-28 DIAGNOSIS — IMO0002 Reserved for concepts with insufficient information to code with codable children: Secondary | ICD-10-CM

## 2019-05-28 LAB — TYPE AND SCREEN
ABO/RH(D): O NEG
Antibody Screen: NEGATIVE

## 2019-05-28 LAB — GLUCOSE, CAPILLARY
Glucose-Capillary: 112 mg/dL — ABNORMAL HIGH (ref 70–99)
Glucose-Capillary: 109 mg/dL — ABNORMAL HIGH (ref 70–99)
Glucose-Capillary: 154 mg/dL — ABNORMAL HIGH (ref 70–99)
Glucose-Capillary: 145 mg/dL — ABNORMAL HIGH (ref 70–99)

## 2019-05-28 LAB — SURGICAL PATHOLOGY

## 2019-05-28 LAB — CEA: CEA: 1.2 ng/mL (ref 0.0–4.7)

## 2019-05-28 SURGERY — COLECTOMY, PARTIAL, ROBOT-ASSISTED, LAPAROSCOPIC
Anesthesia: General

## 2019-05-28 SURGERY — COLECTOMY, PARTIAL, ROBOT-ASSISTED, LAPAROSCOPIC
Anesthesia: General | Laterality: Left

## 2019-05-28 MED ORDER — LIDOCAINE HCL (PF) 2 % IJ SOLN
INTRAMUSCULAR | Status: AC
  Filled 2019-05-28: qty 5

## 2019-05-28 MED ORDER — PHENYLEPHRINE HCL (PRESSORS) 10 MG/ML IV SOLN
INTRAVENOUS | Status: DC | PRN
  Administered 2019-05-28 (×2): 150 ug via INTRAVENOUS
  Administered 2019-05-28 (×3): 100 ug via INTRAVENOUS
  Administered 2019-05-28: 200 ug via INTRAVENOUS
  Administered 2019-05-28: 100 ug via INTRAVENOUS

## 2019-05-28 MED ORDER — PANTOPRAZOLE SODIUM 40 MG IV SOLR
40.0000 mg | Freq: Every day | INTRAVENOUS | Status: DC
  Administered 2019-05-28 – 2019-05-31 (×4): 40 mg via INTRAVENOUS
  Filled 2019-05-28 (×4): qty 40

## 2019-05-28 MED ORDER — ALBUMIN HUMAN 5 % IV SOLN: INTRAVENOUS | Status: DC | PRN

## 2019-05-28 MED ORDER — ESMOLOL HCL 100 MG/10ML IV SOLN
INTRAVENOUS | Status: AC
  Filled 2019-05-28: qty 10

## 2019-05-28 MED ORDER — GLYCOPYRROLATE 0.2 MG/ML IJ SOLN
INTRAMUSCULAR | Status: DC | PRN
  Administered 2019-05-28 (×2): .1 mg via INTRAVENOUS

## 2019-05-28 MED ORDER — PROPOFOL 10 MG/ML IV BOLUS
INTRAVENOUS | Status: AC
  Filled 2019-05-28: qty 20

## 2019-05-28 MED ORDER — ONDANSETRON HCL 4 MG/2ML IJ SOLN
INTRAMUSCULAR | Status: AC
  Filled 2019-05-28: qty 2

## 2019-05-28 MED ORDER — LACTATED RINGERS IV SOLN: INTRAVENOUS | Status: DC | PRN

## 2019-05-28 MED ORDER — SODIUM CHLORIDE 0.9 % IV SOLN
1.0000 g | INTRAVENOUS | Status: DC
  Administered 2019-05-29: 1000 mg via INTRAVENOUS
  Filled 2019-05-28 (×2): qty 1

## 2019-05-28 MED ORDER — ACETAMINOPHEN 10 MG/ML IV SOLN
INTRAVENOUS | Status: DC | PRN
  Administered 2019-05-28 (×2): 1000 mg via INTRAVENOUS

## 2019-05-28 MED ORDER — ACETAMINOPHEN 10 MG/ML IV SOLN
INTRAVENOUS | Status: AC
  Filled 2019-05-28: qty 100

## 2019-05-28 MED ORDER — FENTANYL CITRATE (PF) 250 MCG/5ML IJ SOLN
INTRAMUSCULAR | Status: AC
  Filled 2019-05-28: qty 5

## 2019-05-28 MED ORDER — KETAMINE HCL 50 MG/ML IJ SOLN
INTRAMUSCULAR | Status: AC
  Filled 2019-05-28: qty 10

## 2019-05-28 MED ORDER — ONDANSETRON HCL 4 MG/2ML IJ SOLN
INTRAMUSCULAR | Status: DC | PRN
  Administered 2019-05-28 (×2): 4 mg via INTRAVENOUS

## 2019-05-28 MED ORDER — SUGAMMADEX SODIUM 200 MG/2ML IV SOLN
INTRAVENOUS | Status: AC
  Filled 2019-05-28: qty 4

## 2019-05-28 MED ORDER — SODIUM CHLORIDE 0.9 % IV SOLN: INTRAVENOUS | Status: DC | PRN

## 2019-05-28 MED ORDER — MORPHINE SULFATE (PF) 2 MG/ML IV SOLN
2.0000 mg | INTRAVENOUS | Status: DC | PRN
  Administered 2019-05-29: 2 mg via INTRAVENOUS
  Filled 2019-05-28: qty 1

## 2019-05-28 MED ORDER — SUGAMMADEX SODIUM 200 MG/2ML IV SOLN
INTRAVENOUS | Status: DC | PRN
  Administered 2019-05-28: 400 mg via INTRAVENOUS

## 2019-05-28 MED ORDER — GLYCOPYRROLATE 0.2 MG/ML IJ SOLN
INTRAMUSCULAR | Status: AC
  Filled 2019-05-28: qty 1

## 2019-05-28 MED ORDER — DEXAMETHASONE SODIUM PHOSPHATE 10 MG/ML IJ SOLN
INTRAMUSCULAR | Status: DC | PRN
  Administered 2019-05-28: 8 mg via INTRAVENOUS

## 2019-05-28 MED ORDER — SUCCINYLCHOLINE CHLORIDE 20 MG/ML IJ SOLN
INTRAMUSCULAR | Status: AC
  Filled 2019-05-28: qty 1

## 2019-05-28 MED ORDER — LIDOCAINE HCL (PF) 2 % IJ SOLN
INTRAMUSCULAR | Status: AC
  Filled 2019-05-28: qty 40

## 2019-05-28 MED ORDER — FENTANYL CITRATE (PF) 100 MCG/2ML IJ SOLN
INTRAMUSCULAR | Status: AC
  Filled 2019-05-28: qty 2

## 2019-05-28 MED ORDER — BUPIVACAINE-EPINEPHRINE (PF) 0.25% -1:200000 IJ SOLN
INTRAMUSCULAR | Status: DC | PRN
  Administered 2019-05-28: 30 mL via PERINEURAL

## 2019-05-28 MED ORDER — LIDOCAINE HCL (PF) 2 % IJ SOLN
INTRAMUSCULAR | Status: AC
  Filled 2019-05-28: qty 20

## 2019-05-28 MED ORDER — LIDOCAINE HCL (PF) 2 % IJ SOLN
INTRAMUSCULAR | Status: DC | PRN
  Administered 2019-05-28: 1.46 mg/kg/h via INTRADERMAL

## 2019-05-28 MED ORDER — ROCURONIUM BROMIDE 50 MG/5ML IV SOLN
INTRAVENOUS | Status: AC
  Filled 2019-05-28: qty 1

## 2019-05-28 MED ORDER — ALBUMIN HUMAN 5 % IV SOLN
INTRAVENOUS | Status: AC
  Filled 2019-05-28: qty 250

## 2019-05-28 MED ORDER — MIDAZOLAM HCL 2 MG/2ML IJ SOLN
INTRAMUSCULAR | Status: DC | PRN
  Administered 2019-05-28: 2 mg via INTRAVENOUS

## 2019-05-28 MED ORDER — SUCCINYLCHOLINE CHLORIDE 20 MG/ML IJ SOLN
INTRAMUSCULAR | Status: DC | PRN
  Administered 2019-05-28: 120 mg via INTRAVENOUS

## 2019-05-28 MED ORDER — SODIUM CHLORIDE (PF) 0.9 % IJ SOLN
INTRAMUSCULAR | Status: AC
  Filled 2019-05-28: qty 10

## 2019-05-28 MED ORDER — HEPARIN SODIUM (PORCINE) 5000 UNIT/ML IJ SOLN
5000.0000 [IU] | Freq: Three times a day (TID) | INTRAMUSCULAR | Status: DC
  Administered 2019-05-29: 5000 [IU] via SUBCUTANEOUS
  Filled 2019-05-28: qty 1

## 2019-05-28 MED ORDER — ACETAMINOPHEN 500 MG PO TABS
1000.0000 mg | ORAL_TABLET | Freq: Four times a day (QID) | ORAL | Status: DC
  Administered 2019-05-28 – 2019-06-01 (×13): 1000 mg via ORAL
  Filled 2019-05-28 (×14): qty 2

## 2019-05-28 MED ORDER — SEVOFLURANE IN SOLN
RESPIRATORY_TRACT | Status: AC
  Filled 2019-05-28: qty 250

## 2019-05-28 MED ORDER — FENTANYL CITRATE (PF) 100 MCG/2ML IJ SOLN
25.0000 ug | INTRAMUSCULAR | Status: DC | PRN
Start: 2019-05-28 — End: 2019-05-28
  Administered 2019-05-28 (×2): 25 ug via INTRAVENOUS

## 2019-05-28 MED ORDER — KETAMINE HCL 10 MG/ML IJ SOLN
INTRAMUSCULAR | Status: DC | PRN
  Administered 2019-05-28 (×2): 10 mg via INTRAVENOUS
  Administered 2019-05-28: 40 mg via INTRAVENOUS

## 2019-05-28 MED ORDER — DEXMEDETOMIDINE HCL 200 MCG/2ML IV SOLN
INTRAVENOUS | Status: DC | PRN
  Administered 2019-05-28: 8 ug via INTRAVENOUS
  Administered 2019-05-28: 12 ug via INTRAVENOUS
  Administered 2019-05-28: 8 ug via INTRAVENOUS

## 2019-05-28 MED ORDER — LIDOCAINE HCL (CARDIAC) PF 100 MG/5ML IV SOSY
PREFILLED_SYRINGE | INTRAVENOUS | Status: DC | PRN
  Administered 2019-05-28: 100 mg via INTRAVENOUS

## 2019-05-28 MED ORDER — INDOCYANINE GREEN 25 MG IV SOLR
INTRAVENOUS | Status: DC | PRN
  Administered 2019-05-28 (×5): 2.5 mg via INTRAVENOUS

## 2019-05-28 MED ORDER — PHENYLEPHRINE HCL-NACL 10-0.9 MG/250ML-% IV SOLN
INTRAVENOUS | Status: DC | PRN
  Administered 2019-05-28: 30 ug/min via INTRAVENOUS

## 2019-05-28 MED ORDER — LACTATED RINGERS IV SOLN: INTRAVENOUS | Status: DC

## 2019-05-28 MED ORDER — SODIUM CHLORIDE 0.9 % IV SOLN
1.0000 g | Freq: Once | INTRAVENOUS | Status: AC
  Administered 2019-05-28: 1 g via INTRAVENOUS
  Filled 2019-05-28: qty 1

## 2019-05-28 MED ORDER — ROCURONIUM BROMIDE 100 MG/10ML IV SOLN
INTRAVENOUS | Status: DC | PRN
  Administered 2019-05-28: 5 mg via INTRAVENOUS
  Administered 2019-05-28: 45 mg via INTRAVENOUS
  Administered 2019-05-28 (×5): 20 mg via INTRAVENOUS

## 2019-05-28 MED ORDER — MIDAZOLAM HCL 2 MG/2ML IJ SOLN
INTRAMUSCULAR | Status: AC
  Filled 2019-05-28: qty 2

## 2019-05-28 MED ORDER — FENTANYL CITRATE (PF) 100 MCG/2ML IJ SOLN
INTRAMUSCULAR | Status: DC | PRN
  Administered 2019-05-28: 100 ug via INTRAVENOUS
  Administered 2019-05-28 (×2): 50 ug via INTRAVENOUS
  Administered 2019-05-28: 25 ug via INTRAVENOUS
  Administered 2019-05-28: 50 ug via INTRAVENOUS
  Administered 2019-05-28: 25 ug via INTRAVENOUS
  Administered 2019-05-28: 50 ug via INTRAVENOUS

## 2019-05-28 MED ORDER — DEXAMETHASONE SODIUM PHOSPHATE 10 MG/ML IJ SOLN
INTRAMUSCULAR | Status: AC
  Filled 2019-05-28: qty 1

## 2019-05-28 MED ORDER — PROPOFOL 10 MG/ML IV BOLUS
INTRAVENOUS | Status: DC | PRN
  Administered 2019-05-28: 120 mg via INTRAVENOUS

## 2019-05-28 SURGICAL SUPPLY — 82 items
ANCHOR TIS RET SYS 1550ML (BAG) ×3 IMPLANT
BLADE SURG SZ10 CARB STEEL (BLADE) ×3 IMPLANT
BULB RESERV EVAC DRAIN JP 100C (MISCELLANEOUS) ×3 IMPLANT
CANISTER SUCT 1200ML W/VALVE (MISCELLANEOUS) ×3 IMPLANT
CANNULA REDUC XI 12-8 STAPL (CANNULA) ×1
CANNULA REDUC XI 12-8MM STAPL (CANNULA) ×1
CANNULA REDUCER 12-8 DVNC XI (CANNULA) ×1 IMPLANT
CATH ROBINSON RED A/P 20FR (CATHETERS) ×3 IMPLANT
CHLORAPREP W/TINT 26 (MISCELLANEOUS) ×3 IMPLANT
COVER LIGHT HANDLE STERIS (MISCELLANEOUS) ×3 IMPLANT
COVER TIP SHEARS 8 DVNC (MISCELLANEOUS) ×1 IMPLANT
COVER TIP SHEARS 8MM DA VINCI (MISCELLANEOUS) ×2
COVER WAND RF STERILE (DRAPES) ×3 IMPLANT
DECANTER SPIKE VIAL GLASS SM (MISCELLANEOUS) ×3 IMPLANT
DEFOGGER SCOPE WARMER CLEARIFY (MISCELLANEOUS) ×3 IMPLANT
DERMABOND ADVANCED (GAUZE/BANDAGES/DRESSINGS) ×2
DERMABOND ADVANCED .7 DNX12 (GAUZE/BANDAGES/DRESSINGS) ×1 IMPLANT
DRAIN CHANNEL JP 19F (MISCELLANEOUS) ×3 IMPLANT
DRAPE 3/4 80X56 (DRAPES) ×3 IMPLANT
DRAPE ARM DVNC X/XI (DISPOSABLE) ×4 IMPLANT
DRAPE COLUMN DVNC XI (DISPOSABLE) ×1 IMPLANT
DRAPE DA VINCI XI ARM (DISPOSABLE) ×8
DRAPE DA VINCI XI COLUMN (DISPOSABLE) ×2
DRAPE LEGGINS SURG 28X43 STRL (DRAPES) ×3 IMPLANT
DRAPE UNDER BUTTOCK W/FLU (DRAPES) ×3 IMPLANT
ELECT REM PT RETURN 9FT ADLT (ELECTROSURGICAL) ×3
ELECTRODE REM PT RTRN 9FT ADLT (ELECTROSURGICAL) ×1 IMPLANT
GLOVE ORTHO TXT STRL SZ7.5 (GLOVE) ×12 IMPLANT
GOWN STRL REUS W/ TWL LRG LVL3 (GOWN DISPOSABLE) ×6 IMPLANT
GOWN STRL REUS W/TWL LRG LVL3 (GOWN DISPOSABLE) ×12
GRASPER SUT TROCAR 14GX15 (MISCELLANEOUS) ×3 IMPLANT
HANDLE YANKAUER SUCT BULB TIP (MISCELLANEOUS) ×3 IMPLANT
IRRIGATION STRYKERFLOW (MISCELLANEOUS) IMPLANT
IRRIGATOR STRYKERFLOW (MISCELLANEOUS)
IV NS IRRIG 3000ML ARTHROMATIC (IV SOLUTION) IMPLANT
KIT IMAGING PINPOINTPAQ (MISCELLANEOUS) ×3 IMPLANT
KIT PINK PAD W/HEAD ARE REST (MISCELLANEOUS) ×3
KIT PINK PAD W/HEAD ARM REST (MISCELLANEOUS) ×1 IMPLANT
KIT TURNOVER KIT A (KITS) IMPLANT
NEEDLE HYPO 22GX1.5 SAFETY (NEEDLE) ×3 IMPLANT
NEEDLE INSUFFLATION 150MM (ENDOMECHANICALS) ×3 IMPLANT
NS IRRIG 500ML POUR BTL (IV SOLUTION) ×3 IMPLANT
PACK COLON CLEAN CLOSURE (MISCELLANEOUS) ×3 IMPLANT
PACK LAP CHOLECYSTECTOMY (MISCELLANEOUS) ×3 IMPLANT
PORT ACCESS TROCAR AIRSEAL 12 (TROCAR) ×1 IMPLANT
PORT ACCESS TROCAR AIRSEAL 5M (TROCAR) ×2
RELOAD STAPLER 3.5X60 BLU DVNC (STAPLE) IMPLANT
RELOAD STAPLER 4.3X60 GRN DVNC (STAPLE) ×2 IMPLANT
RETRACTOR WOUND ALXS 18CM MED (MISCELLANEOUS) IMPLANT
RTRCTR WOUND ALEXIS O 18CM MED (MISCELLANEOUS)
SCISSORS METZENBAUM CVD 33 (INSTRUMENTS) ×3 IMPLANT
SEAL CANN UNIV 5-8 DVNC XI (MISCELLANEOUS) ×3 IMPLANT
SEAL XI 5MM-8MM UNIVERSAL (MISCELLANEOUS) ×6
SEALER VESSEL DA VINCI XI (MISCELLANEOUS) ×2
SEALER VESSEL EXT DVNC XI (MISCELLANEOUS) ×1 IMPLANT
SET TRI-LUMEN FLTR TB AIRSEAL (TUBING) ×3 IMPLANT
SOLUTION ELECTROLUBE (MISCELLANEOUS) ×3 IMPLANT
STAPLER 60 DA VINCI SURE FORM (STAPLE) ×2
STAPLER 60 SUREFORM DVNC (STAPLE) ×1 IMPLANT
STAPLER CANNULA SEAL DVNC XI (STAPLE) IMPLANT
STAPLER CANNULA SEAL XI (STAPLE)
STAPLER CIRCULAR 29MM (STAPLE) ×3 IMPLANT
STAPLER RELOAD 3.5X60 BLU DVNC (STAPLE)
STAPLER RELOAD 3.5X60 BLUE (STAPLE)
STAPLER RELOAD 4.3X60 GREEN (STAPLE) ×4
STAPLER RELOAD 4.3X60 GRN DVNC (STAPLE) ×2
STAPLER SKIN PROX 35W (STAPLE) IMPLANT
SUT MNCRL 4-0 (SUTURE) ×4
SUT MNCRL 4-0 27XMFL (SUTURE) ×2
SUT VIC AB 0 CT2 27 (SUTURE) ×3 IMPLANT
SUT VIC AB 3-0 SH 27 (SUTURE) ×4
SUT VIC AB 3-0 SH 27X BRD (SUTURE) ×2 IMPLANT
SUT VICRYL 0 AB UR-6 (SUTURE) ×3 IMPLANT
SUT VLOC 90 6 CV-15 VIOLET (SUTURE) ×6 IMPLANT
SUT VLOC 90 6" CV-15 VIOLET (SUTURE) ×3
SUTURE MNCRL 4-0 27XMF (SUTURE) ×2 IMPLANT
SYR 10ML LL (SYRINGE) ×3 IMPLANT
SYR TOOMEY IRRIG 70ML (MISCELLANEOUS) ×3
SYRINGE TOOMEY IRRIG 70ML (MISCELLANEOUS) ×1 IMPLANT
TAPE TRANSPORE STRL 2 31045 (GAUZE/BANDAGES/DRESSINGS) ×3 IMPLANT
TROCAR XCEL NON-BLD 5MMX100MML (ENDOMECHANICALS) ×3 IMPLANT
TUBING EVAC SMOKE HEATED PNEUM (TUBING) ×6 IMPLANT

## 2019-05-28 NOTE — Chronic Care Management (AMB) (Signed)
Chronic Care Management   Follow Up Note   05/28/2019 Name: [name] MRN: [id number] DOB: [birth year]  Referred by: [name], DO Reason for referral : Care Coordination   [name] is a 55 y.o. year old female who is a primary care patient of [name], DO. The CCM team was consulted for assistance with chronic disease management and care coordination needs.    Per chart review, patient currently admitted to [facility] since 1/21.  Review of patient status, including review of consultants reports, relevant laboratory and other test results, and collaboration with appropriate care team members and the patient's provider was performed as part of comprehensive patient evaluation and provision of chronic care management services.     Facility-Administered Encounter Medications as of 05/28/2019  Medication  . [MAR Hold] ARIPiprazole (ABILIFY) tablet 10 mg  . [MAR Hold] atorvastatin (LIPITOR) tablet 40 mg  . [MAR Hold] buPROPion (WELLBUTRIN XL) 24 hr tablet 150 mg  . [MAR Hold] cephALEXin (KEFLEX) capsule 500 mg  . dexamethasone (DECADRON) injection  . ertapenem (INVANZ) 1,000 mg in sodium chloride 0.9 % 100 mL IVPB  . fentaNYL (SUBLIMAZE) injection  . [MAR Hold] ferrous sulfate tablet 325 mg  . [MAR Hold] gabapentin (NEURONTIN) capsule 300 mg  . [MAR Hold] insulin aspart (novoLOG) injection 0-15 Units  . lactated ringers infusion  . lidocaine (cardiac) 100 mg/9mL (XYLOCAINE) injection 2%  . [MAR Hold] ondansetron (ZOFRAN) tablet 4 mg   Or  . [MAR Hold] ondansetron (ZOFRAN) injection 4 mg  . propofol (DIPRIVAN) 10 mg/mL bolus/IV push  . rocuronium (ZEMURON) injection  . succinylcholine (ANECTINE) injection  . [MAR Hold] vitamin B-12 (CYANOCOBALAMIN) tablet 1,000 mcg   Outpatient Encounter Medications as of 05/28/2019  Medication Sig Note  . ARIPiprazole (ABILIFY) 10 MG tablet Take 10 mg by mouth at bedtime.      [name] ascorbic acid (VITAMIN C) 500 MG tablet Take 500 mg by mouth daily.   [name] aspirin EC 81 MG tablet Take 81 mg by mouth 3 (three) times a week.   [name] atorvastatin (LIPITOR) 40 MG tablet Take 1 tablet (40 mg total) by mouth daily.   [name] buPROPion (WELLBUTRIN XL) 150 MG 24 hr tablet Take 150 mg by mouth every morning.    . cephALEXin (KEFLEX) 500 MG capsule Take 1 capsule (500 mg total) by mouth 3 (three) times daily.   [name] ELIQUIS 2.5 MG TABS tablet Take 2.5 mg by mouth 2 (two) times daily. 05/21/2019: Has not yet started-Has filled medication-Awaiting a stress test at this time   . HUMALOG KWIKPEN 100 UNIT/ML KwikPen Inject 0.05 mLs (5 Units total) into the skin 3 (three) times daily with meals. + Sliding scale (Patient taking differently: Inject 12 Units into the skin See admin instructions. Inject 12 units into the skin 3 times with meals, plus 2 units for every 50 points above 150) 05/23/2019: Only giving Lantus in the past week per daughter  . hydrochlorothiazide (HYDRODIURIL) 12.5 MG tablet Take 1 tablet (12.5 mg total) by mouth daily.   . Insulin Glargine (LANTUS SOLOSTAR) 100 UNIT/ML Solostar Pen Inject 40 Units into the skin daily. Adjust dose as advised (Patient taking differently: Inject 60 Units into the skin daily before breakfast. ) 05/22/2019: Regimen confirmed to be accurate by the patient's daughter  . Lancets (ONETOUCH ULTRASOFT) lancets Use to check blood sugar up to 3 x daily   . lisinopril (ZESTRIL) 20 MG tablet Take 1 tablet (20 mg  total) by mouth daily.   . metFORMIN (GLUCOPHAGE) 1000 MG tablet Take 1 tablet (1,000 mg total) by mouth 2 (two) times daily with a meal.   . ondansetron (ZOFRAN) 4 MG tablet Take 1 tablet (4 mg total) by mouth every 6 (six) hours. (Patient taking differently: Take 4 mg by mouth every 8 (eight) hours as needed for nausea or vomiting. )   . ONETOUCH VERIO test strip Check blood sugar up to 5 x daily as needed     Goals Addressed            This Visit's  Progress   . PharmD - Medication Management       Current Barriers:  . Financial Barriers . Non adherence to prescribed medication regimen  Pharmacist Clinical Goal(s):  [name] Over the next 30 days, patient will work with [name] to address needs related to optimization of medication regimen and improvement of medication adherence  Interventions: . Perform chart review. Note patient currently inpatient at [facility], admitted 1/21 from ED for cellulitis and GI bleeding.  o Per inpatient provider progress note from this morning, patient found to have "near-obstructing partially ulcerated sigmoid colon mass concerning for malignancy" and laparoscopic sigmoid colectomy planned for today. . Will send message to update rest of CCM team.  Patient Self Care Activities:  . Self administers medications as prescribed o Using weekly pillbox and calendar as adherence tools . Attends all scheduled provider appointments . Calls pharmacy for medication refills . Calls provider office for new concerns or questions . Patient to check blood sugar as directed by Endocrinologist and keep log  Please see past updates related to this goal by clicking on the "Past Updates" button in the selected goal         Plan  The care management team will reach out to the patient again over the next 30 days.   Harlow Asa, PharmD, [place] [employer] [phone]

## 2019-05-28 NOTE — Anesthesia Procedure Notes (Signed)
Procedure Name: Intubation Date/Time: 05/28/2019 11:45 AM Performed by: Lia Foyer, CRNA Pre-anesthesia Checklist: Patient identified, Emergency Drugs available, Suction available and Patient being monitored Patient Re-evaluated:Patient Re-evaluated prior to induction Oxygen Delivery Method: Circle system utilized Preoxygenation: Pre-oxygenation with 100% oxygen Induction Type: IV induction, Rapid sequence and Cricoid Pressure applied Laryngoscope Size: McGraph and 3 Tube type: Oral Tube size: 7.5 mm Number of attempts: 1 Airway Equipment and Method: Stylet and Oral airway Placement Confirmation: ETT inserted through vocal cords under direct vision,  positive ETCO2 and breath sounds checked- equal and bilateral Secured at: 21 cm Tube secured with: Tape Dental Injury: Teeth and Oropharynx as per pre-operative assessment

## 2019-05-28 NOTE — Care Management Important Message (Signed)
Important Message  Patient Details  Name: [name] MRN: [id number] Date of Birth: 1966-02-14   Medicare Important Message Given:  Yes     Dannette Barbara 05/28/2019, 12:44 PM

## 2019-05-28 NOTE — Anesthesia Postprocedure Evaluation (Signed)
Anesthesia Post Note  Patient: VENUS GILLES  Procedure(s) Performed: XI ROBOT ASSISTED LAPAROSCOPIC PARTIAL SIGMOID COLECTOMY (N/A )  Patient location during evaluation: PACU Anesthesia Type: General Level of consciousness: awake and alert Pain management: pain level controlled Vital Signs Assessment: post-procedure vital signs reviewed and stable Respiratory status: spontaneous breathing, nonlabored ventilation, respiratory function stable and patient connected to nasal cannula oxygen Cardiovascular status: blood pressure returned to baseline and stable Postop Assessment: no apparent nausea or vomiting Anesthetic complications: no     Last Vitals:  Vitals:   05/28/19 2024 05/28/19 2044  BP:  (!) 150/76  Pulse: (!) 107 (!) 105  Resp: 20 12  Temp: 36.4 C 36.5 C  SpO2: 95% 96%    Last Pain:  Vitals:   05/28/19 2044  TempSrc: Oral  PainSc:                  Martha Clan

## 2019-05-28 NOTE — Anesthesia Preprocedure Evaluation (Addendum)
Anesthesia Evaluation  Patient identified by MRN, date of birth, ID band Patient awake    Reviewed: Allergy & Precautions, H&P , NPO status , Patient's Chart, lab work & pertinent test results  Airway Mallampati: II  TM Distance: >3 FB Neck ROM: full    Dental  (+) Poor Dentition, Missing   Pulmonary former smoker,           Cardiovascular hypertension,      Neuro/Psych PSYCHIATRIC DISORDERS Depression Schizophrenia polyneuropathy    GI/Hepatic Neg liver ROS, near-obstructing partially ulcerated sigmoid colon mass concerning for malignancy    Endo/Other  diabetes, Insulin DependentMorbid obesity  Renal/GU      Musculoskeletal   Abdominal   Peds  Hematology  (+) Blood dyscrasia, anemia , Hgb 8.7   Anesthesia Other Findings Past Medical History: No date: Depression No date: Diabetes mellitus without complication (HCC) No date: Hyperlipidemia No date: Hypertension No date: Neuromuscular disorder (HCC) No date: Schizo affective schizophrenia ([place])     Comment:  abilify and pazil  Past Surgical History: No date: CHOLECYSTECTOMY 05/26/2019: COLONOSCOPY WITH PROPOFOL; N/A     Comment:  Procedure: COLONOSCOPY WITH PROPOFOL;  Surgeon: Toledo,               Benay Pike, MD;  Location: [facility] END[facility]COPY;  Service:               Gastroenterology;  Laterality: N/A; 05/26/2019: ESOPHAGOGASTRODUODENOSCOPY (EGD) WITH PROPOFOL; N/A     Comment:  Procedure: ESOPHAGOGASTRODUODENOSCOPY (EGD) WITH               PROPOFOL;  Surgeon: Toledo, Benay Pike, MD;  Location:               [facility] ENDOSCOPY;  Service: Gastroenterology;  Laterality:               N/A; 02/26/2016: IRRIGATION AND DEBRIDEMENT FOOT; Right     Comment:  Procedure: RIGHT 2ND TOE DEBRIDEMENT;  Surgeon: Samara Deist, DPM;  Location: [facility] ORS;  Service: Podiatry;                Laterality: Right; 12/13/2018: IRRIGATION AND DEBRIDEMENT FOOT; Left     Comment:   Procedure: IRRIGATION AND DEBRIDEMENT FOOT;  Surgeon:               Caroline More, DPM;  Location: [facility] ORS;  Service:               Podiatry;  Laterality: Left;  BMI    Body Mass Index: 38.02 kg/m      Reproductive/Obstetrics negative OB ROS                            Anesthesia Physical Anesthesia Plan  ASA: III  Anesthesia Plan: General ETT   Post-op Pain Management:    Induction: Rapid sequence and Cricoid pressure planned  PONV Risk Score and Plan: Ondansetron, Dexamethasone, Midazolam and Treatment may vary due to age or medical condition  Airway Management Planned:   Additional Equipment:   Intra-op Plan:   Post-operative Plan:   Informed Consent: I have reviewed the patients History and Physical, chart, labs and discussed the procedure including the risks, benefits and alternatives for the proposed anesthesia with the patient or authorized representative who has indicated his/her understanding and acceptance.     Dental Advisory Given  Plan Discussed with: Anesthesiologist and  CRNA  Anesthesia Plan Comments:         Anesthesia Quick Evaluation

## 2019-05-28 NOTE — Progress Notes (Addendum)
Progress Note    [name]  SWF:[id number] DOB: 09-24-1965  DOA: 05/23/2019 PCP: [name], DO      Brief Narrative:    Medical records reviewed and are as summarized below:  [name] is an 54 y.o. female with medical history significant of diabetes mellitus type 2, hypertension, hyperlipidemia, Charcot foot, schizoaffective disorders, came to emergency room accompanied by daughter because of concern for possible sepsis right lower leg cellulitis.  Patient was diagnosed with cellulitis few days ago and was started on antibiotics, Keflex. Patient had had recent emergency department visits for both cellulitis as well as concern about patient's ability to care for self.      Assessment/Plan:   Active Problems:   Colon cancer (HCC)   DM (diabetes mellitus), type 2, 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HCC)   Polyneuropathy   Essential hypertension   Schizoaffective disorder (HCC)   Hammer toe of second toe of right foot   Hyperlipidemia associated with type 2 diabetes mellitus ([place])   Neuropathic foot ulcer, left, with fat layer exposed ([place])   Cellulitis in diabetic foot (HCC)   Hyponatremia   GI bleed   Cellulitis   Body mass index is 38.02 kg/m.  (Morbid obesity)  Right leg cellulitis: Improved.  Discontinue Keflex  Acute on chronic anemia, likely due to GI bleed: Monitor H&H.  No indication for blood transfusion at this time.  Low normal vitamin B12, iron and ferritin levels.  Continue iron and B12 supplements   Invasive moderately differentiated colon adenocarcinoma (per pathology report)/occult GI bleeding: s/p EGD and colonoscopy on 05/26/2019.  EGD showed gastric polyps that were biopsied and colonoscopy showed polypoid, sessile nonulcerated partially obstructing medium-sized mass in the mid sigmoid colon that was confirmed as colon cancer.  No evidence of metastatic disease on CT chest, abdomen and pelvis.  Patient is  having surgery today for colon cancer.  Consulted oncologist, [name] for further management.  Insulin-dependent diabetes mellitus with peripheral neuropathy: Discontinue Lantus because of n.p.o. status and surgery.  NovoLog as needed for hyperglycemia.  Continue gabapentin.  Hypertension: Continue antihypertensives  Hyponatremia: This is chronic/fluctuating.  Improved.  Schizoaffective disorder: Continue psychotropics.   Family Communication/Anticipated D/C date and plan/Code Status   DVT prophylaxis: SCDs Code Status: Full code Family Communication: Plan discussed with patient  Disposition Plan: To be determined after surgery.    Subjective:   No abdominal pain or diarrhea.  No pain or redness on the right leg.  She has some swelling on both legs which is nothing new.   Objective:    Vitals:   05/27/19 1648 05/27/19 2308 05/28/19 0733 05/28/19 1035  BP: 131/79 (!) 151/77 137/74 (!) 160/85  Pulse: 97 100 98 (!) 103  Resp:  18 18 16   Temp: (!) 97.4 F (36.3 C) 97.8 F (36.6 C) 98.2 F (36.8 C) (!) 97.1 F (36.2 C)  TempSrc: Oral Oral Oral Tympanic  SpO2: 100% 96% 97% 100%  Weight:    120.2 kg  Height:    5\' 10"  (1.778 m)    Intake/Output Summary (Last 24 hours) at 05/28/2019 1518 Last data filed at 05/28/2019 1430 Gross per 24 hour  Intake 500 ml  Output 400 ml  Net 100 ml   Filed Weights   05/23/19 1318 05/24/19 0215 05/28/19 1035  Weight: 120.2 kg 119.5 kg 120.2 kg    Exam:  GEN: NAD SKIN: No rash EYES: No pallor; anicteric ENT: MMM CV: RRR PULM: No wheezing  or rales heard  ABD: soft, obese, NT, +BS CNS: Alert and oriented.  Nonfocal. EXT: Bilateral leg edema.  Charcot foot, left    Data Reviewed:   I have personally reviewed following labs and imaging studies:  Labs: Labs show the following:   Basic Metabolic Panel: Recent Labs  Lab 05/21/19 1557 05/21/19 1557 05/22/19 1545 05/22/19 1545 05/23/19 1330 05/25/19 0724  NA 126*   --  127*  --  129* 136  K 4.9   < > 4.3   < > 4.4 4.8  CL 92*  --  91*  --  95* 105  CO2 22  --  23  --  24 25  GLUCOSE 133*  --  224*  --  161* 129*  BUN 16  --  14  --  16 13  CREATININE 1.35*  --  1.30*  --  1.44* 1.13*  CALCIUM 9.4  --  9.1  --  9.0 9.2   < > = values in this interval not displayed.   GFR Estimated Creatinine Clearance: 79.2 mL/min (A) (by C-G formula based on SCr of 1.13 mg/dL (H)). Liver Function Tests: Recent Labs  Lab 05/21/19 1557 05/22/19 1545  AST 28 25  ALT 39 34  ALKPHOS 114 102  BILITOT 0.5 0.5  PROT 7.6 6.6  ALBUMIN 3.7 3.2*   No results for input(s): LIPASE, AMYLASE in the last 168 hours. No results for input(s): AMMONIA in the last 168 hours. Coagulation profile No results for input(s): INR, PROTIME in the last 168 hours.  CBC: Recent Labs  Lab 05/21/19 1557 05/21/19 1557 05/22/19 1545 05/23/19 1330 05/24/19 0427 05/25/19 0724 05/26/19 0540  WBC 8.3   < > 7.6 8.5 8.7 8.7 10.0  NEUTROABS 6.4  --   --   --  5.4  --   --   HGB 10.2*   < > 8.8* 8.2* 8.0* 7.9* 8.7*  HCT 33.2*   < > 28.7* 26.5* 25.6* 26.2* 29.0*  MCV 79.0*   < > 79.9* 77.5* 77.8* 79.2* 79.0*  PLT 364   < > 337 309 316 306 399   < > = values in this interval not displayed.   Cardiac Enzymes: No results for input(s): CKTOTAL, CKMB, CKMBINDEX, TROPONINI in the last 168 hours. BNP (last 3 results) No results for input(s): PROBNP in the last 8760 hours. CBG: Recent Labs  Lab 05/27/19 1139 05/27/19 1651 05/27/19 2314 05/28/19 0736 05/28/19 1039  GLUCAP 193* 117* 125* 112* 109*   D-Dimer: No results for input(s): DDIMER in the last 72 hours. Hgb A1c: No results for input(s): HGBA1C in the last 72 hours. Lipid Profile: No results for input(s): CHOL, HDL, LDLCALC, TRIG, CHOLHDL, LDLDIRECT in the last 72 hours. Thyroid function studies: No results for input(s): TSH, T4TOTAL, T3FREE, THYROIDAB in the last 72 hours.  Invalid input(s): FREET3 Anemia work  up: Recent Labs    05/26/19 [place] 264   Sepsis Labs: Recent Labs  Lab 05/21/19 1557 05/22/19 1545 05/23/19 1330 05/23/19 1644 05/24/19 0427 05/25/19 0724 05/26/19 0540  PROCALCITON 0.65  --   --   --   --   --   --   WBC 8.3   < > 8.5  --  8.7 8.7 10.0  LATICACIDVEN  --   --  1.8 1.6  --   --   --    < > = values in this interval not displayed.    Microbiology Recent Results (from the  past 240 hour(s))  Novel Coronavirus, NAA ([facility])     Status: None   Collection Time: 05/20/19  9:46 AM   Specimen: Nasopharyngeal(NP) swabs in vial transport medium   NASOPHARYNGE  TESTING  Result Value Ref Range Status   SARS-CoV-2, NAA Not Detected Not Detected Final    Comment: This nucleic acid amplification test was developed and its performance characteristics determined by [employer]. Nucleic acid amplification tests include PCR and TMA. This test has not been FDA cleared or approved. This test has been authorized by FDA under an Emergency Use Authorization (EUA). This test is only authorized for the duration of time the declaration that circumstances exist justifying the authorization of the emergency use of in vitro diagnostic tests for detection of SARS-CoV-2 virus and/or diagnosis of COVID-19 infection under section 564(b)(1) of the Act, 21 U.S.C. 834HDQ-2(I) (1), unless the authorization is terminated or revoked sooner. When diagnostic testing is negative, the possibility of a false negative result should be considered in the context of a patient's recent exposures and the presence of clinical signs and symptoms consistent with COVID-19. An individual without symptoms of COVID-19 and who is not shedding SARS-CoV-2 virus would  expect to have a negative (not detected) result in this assay.   Respiratory Panel by RT PCR (Flu A&B, Covid) - Nasopharyngeal Swab     Status: None   Collection Time: 05/21/19  3:41 PM   Specimen: Nasopharyngeal Swab  Result  Value Ref Range Status   SARS Coronavirus 2 by RT PCR NEGATIVE NEGATIVE Final    Comment: (NOTE) SARS-CoV-2 target nucleic acids are NOT DETECTED. The SARS-CoV-2 RNA is generally detectable in upper respiratoy specimens during the acute phase of infection. The lowest concentration of SARS-CoV-2 viral copies this assay can detect is 131 copies/mL. A negative result does not preclude SARS-Cov-2 infection and should not be used as the sole basis for treatment or other patient management decisions. A negative result may occur with  improper specimen collection/handling, submission of specimen other than nasopharyngeal swab, presence of viral mutation(s) within the areas targeted by this assay, and inadequate number of viral copies (<131 copies/mL). A negative result must be combined with clinical observations, patient history, and epidemiological information. The expected result is Negative. Fact Sheet for Patients:  PinkCheek.be Fact Sheet for Healthcare Providers:  GravelBags.it This test is not yet ap proved or cleared by the [place] FDA and  has been authorized for detection and/or diagnosis of SARS-CoV-2 by FDA under an Emergency Use Authorization (EUA). This EUA will remain  in effect (meaning this test can be used) for the duration of the COVID-19 declaration under Section 564(b)(1) of the Act, 21 U.S.C. section 360bbb-3(b)(1), unless the authorization is terminated or revoked sooner.    Influenza A by PCR NEGATIVE NEGATIVE Final   Influenza B by PCR NEGATIVE NEGATIVE Final    Comment: (NOTE) The Xpert Xpress SARS-CoV-2/FLU/RSV assay is intended as an aid in  the diagnosis of influenza from Nasopharyngeal swab specimens and  should not be used as a sole basis for treatment. Nasal washings and  aspirates are unacceptable for Xpert Xpress SARS-CoV-2/FLU/RSV  testing. Fact Sheet for  Patients: PinkCheek.be Fact Sheet for Healthcare Providers: GravelBags.it This test is not yet approved or cleared by the [place] FDA and  has been authorized for detection and/or diagnosis of SARS-CoV-2 by  FDA under an Emergency Use Authorization (EUA). This EUA will remain  in effect (meaning this test can be used) for the duration  of the  Covid-19 declaration under Section 564(b)(1) of the Act, 21  U.S.C. section 360bbb-3(b)(1), unless the authorization is  terminated or revoked. Performed at [facility], [address] [place], [place] [address]   Urine culture     Status: Abnormal   Collection Time: 05/21/19 11:17 PM   Specimen: Urine, Random  Result Value Ref Range Status   Specimen Description   Final    URINE, RANDOM Performed at [facility], [address], [place], [place] [address]    Special Requests   Final    NONE Performed at [facility], [address], [place], [place] [address]    Culture MULTIPLE SPECIES PRESENT, SUGGEST RECOLLECTION (A)  Final   Report Status 05/23/2019 FINAL  Final  SARS CORONAVIRUS 2 (TAT 6-24 HRS) Nasopharyngeal Nasopharyngeal Swab     Status: None   Collection Time: 05/23/19 11:15 PM   Specimen: Nasopharyngeal Swab  Result Value Ref Range Status   SARS Coronavirus 2 NEGATIVE NEGATIVE Final    Comment: (NOTE) SARS-CoV-2 target nucleic acids are NOT DETECTED. The SARS-CoV-2 RNA is generally detectable in upper and lower respiratory specimens during the acute phase of infection. Negative results do not preclude SARS-CoV-2 infection, do not rule out co-infections with other pathogens, and should not be used as the sole basis for treatment or other patient management decisions. Negative results must be combined with clinical observations, patient history, and epidemiological information. The expected result is Negative. Fact Sheet for  Patients: SugarRoll.be Fact Sheet for Healthcare Providers: https://www.woods-mathews.com/ This test is not yet approved or cleared by the [place] FDA and  has been authorized for detection and/or diagnosis of SARS-CoV-2 by FDA under an Emergency Use Authorization (EUA). This EUA will remain  in effect (meaning this test can be used) for the duration of the COVID-19 declaration under Section 56 4(b)(1) of the Act, 21 U.S.C. section 360bbb-3(b)(1), unless the authorization is terminated or revoked sooner. Performed at [facility], [place] [address], [place], [place] [address]   GI pathogen panel by PCR, stool     Status: Abnormal   Collection Time: 05/24/19 10:00 PM   Specimen: Stool  Result Value Ref Range Status   Plesiomonas shigelloides NOT DETECTED NOT DETECTED Final   Yersinia enterocolitica NOT DETECTED NOT DETECTED Final   Vibrio NOT DETECTED NOT DETECTED Final   Enteropathogenic E coli NOT DETECTED NOT DETECTED Final   E coli (ETEC) LT/ST NOT DETECTED NOT DETECTED Final   E coli 0093 by PCR Not applicable NOT DETECTED Final   Cryptosporidium by PCR DETECTED (A) NOT DETECTED Final   Entamoeba histolytica NOT DETECTED NOT DETECTED Final   Adenovirus F 40/41 NOT DETECTED NOT DETECTED Final   Norovirus GI/GII NOT DETECTED NOT DETECTED Final   Sapovirus NOT DETECTED NOT DETECTED Final    Comment: (NOTE) Performed At: [facility] [place], [place] [id number] Rush Farmer MD IR:[id number]    Vibrio cholerae NOT DETECTED NOT DETECTED Final   Campylobacter by PCR NOT DETECTED NOT DETECTED Final   Salmonella by PCR NOT DETECTED NOT DETECTED Final   E coli (STEC) NOT DETECTED NOT DETECTED Final   Enteroaggregative E coli NOT DETECTED NOT DETECTED Final   Shigella by PCR NOT DETECTED NOT DETECTED Final   Cyclospora cayetanensis NOT DETECTED NOT DETECTED Final   Astrovirus NOT DETECTED NOT DETECTED Final    G lamblia by PCR NOT DETECTED NOT DETECTED Final   Rotavirus A by PCR NOT DETECTED NOT  DETECTED Final  C Difficile Quick Screen w PCR reflex     Status: None   Collection Time: 05/24/19 10:00 PM   Specimen: STOOL  Result Value Ref Range Status   C Diff antigen NEGATIVE NEGATIVE Final   C Diff toxin NEGATIVE NEGATIVE Final   C Diff interpretation No C. difficile detected.  Final    Comment: Performed at [name], [address] [place], [place] [address]    Procedures and diagnostic studies:  CT ABDOMEN PELVIS W CONTRAST  Result Date: 05/26/2019 CLINICAL DATA:  Colorectal cancer staging, sigmoid mass on colonoscopy today RIGHT lower quadrant pain and loss of appetite for 2 weeks EXAM: CT ABDOMEN AND PELVIS WITH CONTRAST TECHNIQUE: Multidetector CT imaging of the abdomen and pelvis was performed using the standard protocol following bolus administration of intravenous contrast. Sagittal and coronal MPR images reconstructed from axial data set. CONTRAST:  118mL OMNIPAQUE IOHEXOL 300 MG/ML SOLN IV. No oral contrast was administered COMPARISON:  CT angio chest 05/21/2019, CT abdomen and pelvis 08/19/2012 FINDINGS: Lower chest: Minimal subsegmental atelectasis LEFT lower lobe Hepatobiliary: Gallbladder surgically absent. Liver normal appearance. No definite hepatic mass lesion or nodularity. Pancreas: Normal appearance Spleen: Normal appearance Adrenals/Urinary Tract: LEFT adrenal nodule 18 x 13 mm image 29 previously 18 x 11 mm. RIGHT adrenal gland unremarkable. Kidneys, ureters, and bladder normal appearance Stomach/Bowel: Normal appendix. Slight thickening of the sigmoid colon approximately 3 cm transverse over approximately 4 cm length question neoplasm identified at colonoscopy. Minimal adjacent edema, nonspecific, could potentially be related to preceding endoscopic procedure. No other focal colonic abnormalities seen. Stomach and small bowel loops normal appearance.  Vascular/Lymphatic: Mild atherosclerotic calcifications aorta without aneurysm. No adenopathy. Reproductive: Normal appearance of uterus and LEFT ovary. Fat containing mass in RIGHT adnexa consistent with dermoid tumor, 4.2 x 3.1 x 3.5 cm image 86. Other: No free air or free fluid. No hernia or inflammatory process. Musculoskeletal: Unremarkable IMPRESSION: Thickened segment of sigmoid colon approximately 3 cm diameter by 4 cm length question neoplasm identified by colonoscopy. Nonspecific mild infiltration of pericolic fat adjacent to the thickened sigmoid segment, nonspecific in the setting of preceding endoscopy. 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 Stable LEFT adrenal nodule since 2014 favors a benign etiology. No definite evidence of metastatic disease. Aortic Atherosclerosis (ICD10-I70.0). Electronically Signed   By: Lavonia Dana M.D.   On: 05/26/2019 17:53    Medications:   . [MAR Hold] ARIPiprazole  10 mg Oral QHS  . [MAR Hold] atorvastatin  40 mg Oral Daily  . [MAR Hold] buPROPion  150 mg Oral q morning - 10a  . [MAR Hold] cephALEXin  500 mg Oral Q12H  . [MAR Hold] ferrous sulfate  325 mg Oral QODAY  . [MAR Hold] gabapentin  300 mg Oral QHS  . [MAR Hold] insulin aspart  0-15 Units Subcutaneous TID WC  . [MAR Hold] vitamin B-12  1,000 mcg Oral Daily   Continuous Infusions:    LOS: 5 days   Kisa Fujii  [facility]   *Please refer to [email or web address], password [email or web address] to get updated schedule on who will round on this patient, as hospitalists switch teams weekly. If 7PM-7AM, please contact night-coverage at [email or web address], password [email or web address] for any overnight needs.  05/28/2019, 3:18 PM

## 2019-05-28 NOTE — Progress Notes (Signed)
[facility] Day(s): 5.   Post op day(s): Day of Surgery  Interval History:  Patient seen and examined no acute events or new complaints overnight. Patient reports she is doing well No abdominal pain, nausea, emesis No issues with bowel prep Plan for robotic assisted laparoscopic sigmoid colectomy with Dr Christian Mate today   Vital signs in last 24 hours: [min-max] current  Temp:  [97.4 F (36.3 C)-98 F (36.7 C)] 97.8 F (36.6 C) (01/25 2308) Pulse Rate:  [97-100] 100 (01/25 2308) Resp:  [17-18] 18 (01/25 2308) BP: (124-151)/(76-79) 151/77 (01/25 2308) SpO2:  [96 %-100 %] 96 % (01/25 2308)     Height: 5\' 10"  (177.8 cm) Weight: 119.5 kg BMI (Calculated): 37.81   Intake/Output last 2 shifts:  No intake/output data recorded.   Physical Exam:  Constitutional: alert, cooperative and no distress  Respiratory: breathing non-labored at rest  Cardiovascular: regular rate and sinus rhythm  Gastrointestinal: soft, non-tender, and non-distended, no rebound or guarding Integumentary: warm, dry  Labs:  CBC Latest Ref Rng & Units 05/26/2019 05/25/2019 05/24/2019  WBC 4.0 - 10.5 K/uL 10.0 8.7 8.7  Hemoglobin 12.0 - 15.0 g/dL 8.7(L) 7.9(L) 8.0(L)  Hematocrit 36.0 - 46.0 % 29.0(L) 26.2(L) 25.6(L)  Platelets 150 - 400 K/uL 399 306 316   CMP Latest Ref Rng & Units 05/25/2019 05/23/2019 05/22/2019  Glucose 70 - 99 mg/dL 129(H) 161(H) 224(H)  BUN 6 - 20 mg/dL 13 16 14   Creatinine 0.44 - 1.00 mg/dL 1.13(H) 1.44(H) 1.30(H)  Sodium 135 - 145 mmol/L 136 129(L) 127(L)  Potassium 3.5 - 5.1 mmol/L 4.8 4.4 4.3  Chloride 98 - 111 mmol/L 105 95(L) 91(L)  CO2 22 - 32 mmol/L 25 24 23   Calcium 8.9 - 10.3 mg/dL 9.2 9.0 9.1  Total Protein 6.5 - 8.1 g/dL - - 6.6  Total Bilirubin 0.3 - 1.2 mg/dL - - 0.5  Alkaline Phos 38 - 126 U/L - - 102  AST 15 - 41 U/L - - 25  ALT 0 - 44 U/L - - 34     Imaging studies: No new pertinent imaging  studies   Assessment/Plan: (ICD-10's: K40.89) 54 y.o. female with near-obstructing partially ulcerated sigmoid colon mass concerning for malignancy    - Plan for robotic assisted laparoscopic sigmoid colectomy with Dr Christian Mate today, consent obtained, questions answered   - NPO + IVF  - pain control prn; antiemetics prn  - monitor abdominal examination   - further management per primary service   All of the above findings and recommendations were discussed with the patient, and the medical team, and all of patient's questions were answered to her expressed satisfaction.  -- Edison Simon, PA-C [facility] 05/28/2019, 7:08 AM [phone] M-F: 7am - 4pm \

## 2019-05-28 NOTE — Transfer of Care (Signed)
Immediate Anesthesia Transfer of Care Note  Patient: [name]  Procedure(s) Performed: XI ROBOT ASSISTED LAPAROSCOPIC PARTIAL SIGMOID COLECTOMY (N/A )  Patient Location: PACU  Anesthesia Type:General  Level of Consciousness: drowsy and patient cooperative  Airway & Oxygen Therapy: Patient Spontanous Breathing and Patient connected to face mask oxygen  Post-op Assessment: Report given to RN and Post -op Vital signs reviewed and stable  Post vital signs: Reviewed and stable  Last Vitals:  Vitals Value Taken Time  BP 141/77 (97)   Temp 99.1 F   Pulse 103   Resp 20   SpO2 97%     Last Pain:  Vitals:   05/28/19 1035  TempSrc: Tympanic  PainSc: 0-No pain         Complications: No apparent anesthesia complications

## 2019-05-28 NOTE — Plan of Care (Signed)
  Problem: Education: Goal: Knowledge of General Education information will improve Description: Including pain rating scale, medication(s)/side effects and non-pharmacologic comfort measures Outcome: Progressing   Problem: Clinical Measurements: Goal: Ability to maintain clinical measurements within normal limits will improve Outcome: Progressing Goal: Diagnostic test results will improve Outcome: Progressing Goal: Respiratory complications will improve Outcome: Progressing Goal: Cardiovascular complication will be avoided Outcome: Progressing   Problem: Activity: Goal: Risk for activity intolerance will decrease Outcome: Progressing   Problem: Coping: Goal: Level of anxiety will decrease Outcome: Progressing   Problem: Pain Managment: Goal: General experience of comfort will improve Outcome: Progressing   Problem: Safety: Goal: Ability to remain free from injury will improve Outcome: Progressing

## 2019-05-28 NOTE — Op Note (Addendum)
Pre-operative Diagnosis: Robotic assisted laparoscopic sigmoid colectomy with splenic mobilization.  Post-operative Diagnosis: same.   Surgeon: Ronny Bacon, M.D., [place]  Assistant surgeon: Caroleen Hamman, M.D.   Anesthesia: General endotracheal  Findings:  Sigmoid colonic mass as expected.  Tattooing extensive, including proximal "drift."  Estimated Blood Loss: 50 mL         Specimens: Sigmoid colon, and mesentery.          Complications: none         Procedure Details  The patient was seen again in the Holding Room. The benefits, complications, treatment options, and expected outcomes were discussed with the patient. The risks of bleeding, infection, recurrence of symptoms, failure to resolve symptoms, unanticipated injury, prosthetic placement, prosthetic infection, any of which could require further surgery were reviewed with the patient. The likelihood of improving the patient's symptoms with return to their baseline status is reasonable.  The patient and/or family concurred with the proposed plan, giving informed consent.  The patient was taken to Operating Room, identified and the procedure verified.    Prior to the induction of general anesthesia, antibiotic prophylaxis was administered. VTE prophylaxis was in place.  General anesthesia was then administered and tolerated well. After the induction, the patient was positioned in the low lithotomy position and the abdomen was prepped with Chloraprep and draped in the sterile fashion.  Perineum prepped with Betadine.  A Time Out was held and the above information confirmed.  After local infiltration of quarter percent Marcaine with epinephrine, stab incision was made left upper quadrant.  Just below the costal margin approximately midclavicular line the Veress needle is passed with sensation of the layers to penetrate the abdominal wall and into the peritoneum.  Saline drop test is confirmed peritoneal placement.  Insufflation is  initiated with carbon dioxide to pressures of 15 mmHg.  I then utilized a 12 mm air seal optical port and placed it under direct visualization in the right flank anticipating its utilization as a an Environmental consultant port. We evaluated the abdomen for scarring and adhesions and found only omental adhesion to the periumbilical region.  We then proceeded with placement of four robotic trochars under direct visualization.  The 12 mm trocar was placed in the right lower quadrant and in a straight line to the left costal/epigastric area scattered three 8 mm trochars.  Insufflation was maintained with a air seal. The adhesions were taken down and tension was applied to restoring normal anatomy. I then positioned the sigmoid colon utilizing a tips up fenestrated grasper and initiated dissection of the retroperitoneum near the midline.  However due to the extensive tattooing and discoloration of the retroperitoneum I was quite challenged at discerning tissue planes and could not identify a bloodless plane to continue. I then alternated to attempting a lateral to medial mobilization of the sigmoid colon.  Once the sigmoid and distal descending colon were well mobilized and identified the ureter I then returned to pursue dissection of the IMA. The IMA was dissected and skeletonized using bipolar forceps and monopolar scissors.  I then divided the IMA with the vessel sealer with excellent hemostasis. Then proceeded with the retroperitoneal dissection which was enabled by identifying the posterior IMA plane.  This completed the medial to lateral dissection of the distal descending colon. I then proceeded with the mesorectal dissection to mobilize the rectosigmoid junction which appeared to be sufficiently distal to the tumor and tumor tattoo.  We began a tumor specific mesenteric excision beginning posteriorly and mobilizing  both side pedicles.  We then selected a point for distal division and approached the proximal rectum  with the vessel sealer and skeletonized this area for division with the 60 mm stapler. Two firings of a green cartridge of the 60 mm stapler enabled [place] to completely transect the rectum. I then proceeded with completing the splenic flexure mobilization.  And then I selected a point for proximal division. Tenting this point and maintaining reference I then divided the distal descending colon sigmoid mesentery with the vessel sealer maintaining excellent hemostasis. I then transected the colon with the vessel sealer.  We then passed the 29 mm EEA anvil into the abdomen by dilating one of the port sites.  I then utilized this pursestring suture of three oh V-Loc to secure the anvil into the distal colon. While retracting the uterus for eventual completion of the end-to-end anastomosis and an inadvertent tear to the peritoneum within the pouch of Nathaneil Canary was made.  We then evaluated this with rigid sigmoidoscopy and direct examination laparoscopically and confirmed there was no injury to the rectum or the vagina. We were able then to successfully dilate up the rectum with the anal dilators and completed the EEA anastomosis in end-to-end fashion having two excellent donuts.  We then tested the anastomosis with insufflation of air and saw it to be airtight. We then undocked the robot, placed the specimen in a retrieval bag and withdrew it out of the right abdominal wall after extending the incision of one of the ports.  We then placed a drain in the pelvis.  We then irrigated the abdominal cavity with multiple aliquots of saline solution and aspirated the fluid.  Hemostasis was excellent.  We then removed our ports, closed the extraction site and placed a fascial suture at the 12 mm trocar site.  We then closed the skin with interrupted 3-0 Vicryl and 4-0 Monocryl subcuticulars.  And sealed the skin incisions with Dermabond.   JP drain in the right lower quadrant. Overall the patient appeared to tolerate the  procedure well.  Needle sponge instrument count were reported correct.    Ronny Bacon M.D., [facility] 05/28/2019 7:33 PM

## 2019-05-29 DIAGNOSIS — F209 Schizophrenia, unspecified: Secondary | ICD-10-CM

## 2019-05-29 DIAGNOSIS — C189 Malignant neoplasm of colon, unspecified: Secondary | ICD-10-CM

## 2019-05-29 DIAGNOSIS — Z79899 Other long term (current) drug therapy: Secondary | ICD-10-CM

## 2019-05-29 DIAGNOSIS — D649 Anemia, unspecified: Secondary | ICD-10-CM

## 2019-05-29 DIAGNOSIS — E785 Hyperlipidemia, unspecified: Secondary | ICD-10-CM

## 2019-05-29 DIAGNOSIS — I10 Essential (primary) hypertension: Secondary | ICD-10-CM

## 2019-05-29 DIAGNOSIS — Z87891 Personal history of nicotine dependence: Secondary | ICD-10-CM

## 2019-05-29 LAB — CBC
HCT: 26.2 % — ABNORMAL LOW (ref 36.0–46.0)
Hemoglobin: 7.9 g/dL — ABNORMAL LOW (ref 12.0–15.0)
MCH: 24.1 pg — ABNORMAL LOW (ref 26.0–34.0)
MCHC: 30.2 g/dL (ref 30.0–36.0)
MCV: 79.9 fL — ABNORMAL LOW (ref 80.0–100.0)
Platelets: 360 10*3/uL (ref 150–400)
RBC: 3.28 MIL/uL — ABNORMAL LOW (ref 3.87–5.11)
RDW: 16.3 % — ABNORMAL HIGH (ref 11.5–15.5)
WBC: 14.8 10*3/uL — ABNORMAL HIGH (ref 4.0–10.5)
nRBC: 0 % (ref 0.0–0.2)

## 2019-05-29 LAB — GLUCOSE, CAPILLARY
Glucose-Capillary: 97 mg/dL (ref 70–99)
Glucose-Capillary: 160 mg/dL — ABNORMAL HIGH (ref 70–99)
Glucose-Capillary: 162 mg/dL — ABNORMAL HIGH (ref 70–99)
Glucose-Capillary: 146 mg/dL — ABNORMAL HIGH (ref 70–99)

## 2019-05-29 LAB — BASIC METABOLIC PANEL
Anion gap: 11 (ref 5–15)
BUN: 10 mg/dL (ref 6–20)
CO2: 20 mmol/L — ABNORMAL LOW (ref 22–32)
Calcium: 8.6 mg/dL — ABNORMAL LOW (ref 8.9–10.3)
Chloride: 106 mmol/L (ref 98–111)
Creatinine, Ser: 1.17 mg/dL — ABNORMAL HIGH (ref 0.44–1.00)
GFR calc Af Amer: >60 mL/min (ref >60)
GFR calc non Af Amer: 52 mL/min — ABNORMAL LOW (ref >60)
Glucose, Bld: 115 mg/dL — ABNORMAL HIGH (ref 70–99)
Potassium: 4.3 mmol/L (ref 3.5–5.1)
Sodium: 137 mmol/L (ref 135–145)

## 2019-05-29 MED ORDER — GUAIFENESIN ER 600 MG PO TB12
600.0000 mg | ORAL_TABLET | Freq: Two times a day (BID) | ORAL | Status: AC
  Administered 2019-05-29 – 2019-05-30 (×3): 600 mg via ORAL
  Filled 2019-05-29 (×3): qty 1

## 2019-05-29 NOTE — Plan of Care (Signed)
  Problem: Education: Goal: Knowledge of General Education information will improve Description: Including pain rating scale, medication(s)/side effects and non-pharmacologic comfort measures Outcome: Progressing   Problem: Clinical Measurements: Goal: Ability to maintain clinical measurements within normal limits will improve Outcome: Progressing Goal: Will remain free from infection Outcome: Progressing Goal: Diagnostic test results will improve Outcome: Progressing   Problem: Activity: Goal: Risk for activity intolerance will decrease Outcome: Progressing   Problem: Nutrition: Goal: Adequate nutrition will be maintained Outcome: Progressing   Problem: Coping: Goal: Level of anxiety will decrease Outcome: Progressing   Problem: Pain Managment: Goal: General experience of comfort will improve Outcome: Progressing   Problem: Safety: Goal: Ability to remain free from injury will improve Outcome: Progressing

## 2019-05-29 NOTE — Progress Notes (Signed)
PROGRESS NOTE    [name]  VFI:[id number] DOB: 12/27/1965 DOA: 05/23/2019  PCP: [name], DO    LOS - 6   Brief Narrative:  [name] is an 54 y.o. female with medical history significant ofdiabetes mellitus type 2, hypertension, hyperlipidemia, Charcot foot, schizoaffective disorder, presented to the ED on 05/22/19, accompanied by daughter because of concern for possible sepsis and right lower leg cellulitis. Patient was diagnosed with cellulitis few days prior and had been started on Keflex.  Patient had also had recent ED visits for both cellulitis as well as concern about patient's ability to care for self.  In the ED, duplex ultrasound negative for DVT, left lower extremity cellulitis confirmed.  Hemoglobin 8 from 10 previously, with positive heme occult stool.  Admitted to hospitalist service with GI consulted.  EGD and colonoscopy were performed on 05/26/19.  EGD showed irregular z-line, few gastric polyps.  Colonoscopy showed a likely malignant obstructing tumor in the mid-sigmoid colon.  General surgery consulted and patient underwent sigmoid colectomy on 05/28/19.  Subjective 1/27: Patient reports minimal post-op pain this AM.  Not passing gas or stool yet.  Tolerating clear liquid diet.  Denies fever/chills, N/V.  Says her throat feels a little congested after surgery, but no cough or SOB.  No acute events reported.  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  Low normal vitamin B12, iron and ferritin levels.  --Monitor H&H --transfuse RBC's if Hbg < 7.0 --Continue iron and B12 supplements  Adenocarcinoma of Sigmoid Colon: stage to be determined Colonoscopy 05/26/19 showed polypoid, sessile nonulcerated partially obstructing medium-sized mass in the mid sigmoid colon that was confirmed as colon cancer.  No evidence of metastatic disease on CT chest, abdomen and pelvis.  Status post sigmoid colectomy 05/28/19 .   --Oncology following --advance diet per surgery --pain management --antiemetics PRN --bowel regimen  Right leg cellulitis: resolved.  Completed course of Keflex.  Insulin-dependent diabetes mellitus with peripheral neuropathy:  --sliding scale Novolog --continue gabapentin --resume Lantus once taking more PO  Hypertension: Continue antihypertensives  Hyponatremia: This is chronic/fluctuating.  Improved.  Schizoaffective disorder: Continue psychotropics.  Morbid Obesity - BMI 38.02 kg/m   DVT prophylaxis: SCD's   Code Status: Full Code  Family Communication: none at bedside during encounter  Disposition Plan:  Pending clinical improvement, PT eval.  Stability for discharge per surgery.   Consultants:   Gastroenterology  General Surgery  Procedures:   EGD, colonoscopy 05/26/2019  Sigmoid colectomy 05/28/19  Antimicrobials:   Completed Keflex    Objective: Vitals:   05/28/19 2228 05/29/19 0032 05/29/19 0354 05/29/19 0755  BP: (!) 142/73 140/69 136/74 123/66  Pulse: (!) 116 (!) 115 (!) 111 (!) 103  Resp: 18 16 14 18   Temp: 97.7 F (36.5 C) (!) 97.5 F (36.4 C) 97.9 F (36.6 C) 98.2 F (36.8 C)  TempSrc: Oral Oral Oral   SpO2: 99% 100% 100%  97%  Weight:      Height:        Intake/Output Summary (Last 24 hours) at 05/29/2019 1128 Last data filed at 05/29/2019 0900 Gross per 24 hour  Intake 2835 ml  Output 1415 ml  Net 1420 ml   Filed Weights   05/23/19  1318 05/24/19 0215 05/28/19 1035  Weight: 120.2 kg 119.5 kg 120.2 kg    Examination:  General exam: awake, alert, no acute distress, obese HEENT: clear conjunctiva, anicteric sclera, moist mucus membranes, hearing grossly normal  Respiratory system: clear to auscultation bilaterally, no wheezes, rales or rhonchi, normal respiratory effort. Cardiovascular system: normal S1/S2, RRR, no JVD, murmurs, rubs, gallops, no pedal edema.   Gastrointestinal system: soft, non-distended abdomen, absent bowel sounds, appropriate post-op tenderness on palpation Central nervous system: alert and oriented x4. no gross focal neurologic deficits, normal speech Extremities: moves all, no cyanosis, normal tone Skin: dry, intact, normal temperature Psychiatry: normal mood, congruent affect, judgement and insight appear normal    Data Reviewed: I have personally reviewed following labs and imaging studies  CBC: Recent Labs  Lab 05/23/19 1330 05/24/19 0427 05/25/19 0724 05/26/19 0540 05/29/19 0334  WBC 8.5 8.7 8.7 10.0 14.8*  NEUTROABS  --  5.4  --   --   --   HGB 8.2* 8.0* 7.9* 8.7* 7.9*  HCT 26.5* 25.6* 26.2* 29.0* 26.2*  MCV 77.5* 77.8* 79.2* 79.0* 79.9*  PLT 309 316 306 399 096   Basic Metabolic Panel: Recent Labs  Lab 05/22/19 1545 05/23/19 1330 05/25/19 0724 05/29/19 0334  NA 127* 129* 136 137  K 4.3 4.4 4.8 4.3  CL 91* 95* 105 106  CO2 23 24 25  20*  GLUCOSE 224* 161* 129* 115*  BUN 14 16 13 10   CREATININE 1.30* 1.44* 1.13* 1.17*  CALCIUM 9.1 9.0 9.2 8.6*   GFR: Estimated Creatinine Clearance: 76.5 mL/min (A) (by C-G formula based on SCr of 1.17 mg/dL (H)). Liver Function Tests: Recent Labs  Lab 05/22/19 1545  AST 25  ALT 34  ALKPHOS 102  BILITOT 0.5  PROT 6.6  ALBUMIN 3.2*   No results for input(s): LIPASE, AMYLASE in the last 168 hours. No results for input(s): AMMONIA in the last 168 hours. Coagulation Profile: No results for input(s): INR, PROTIME in the last 168  hours. Cardiac Enzymes: No results for input(s): CKTOTAL, CKMB, CKMBINDEX, TROPONINI in the last 168 hours. BNP (last 3 results) No results for input(s): PROBNP in the last 8760 hours. HbA1C: No results for input(s): HGBA1C in the last 72 hours. CBG: Recent Labs  Lab 05/28/19 0736 05/28/19 1039 05/28/19 1942 05/28/19 2221 05/29/19 0823  GLUCAP 112* 109* 154* 145* 97   Lipid Profile: No results for input(s): CHOL, HDL, LDLCALC, TRIG, CHOLHDL, LDLDIRECT in the last 72 hours. Thyroid Function Tests: No results for input(s): TSH, T4TOTAL, FREET4, T3FREE, THYROIDAB in the last 72 hours. Anemia Panel: Recent Labs    05/26/19 1209  VITAMINB12 264   Sepsis Labs: Recent Labs  Lab 05/23/19 1330 05/23/19 1644  LATICACIDVEN 1.8 1.6    Recent Results (from the past 240 hour(s))  Novel Coronavirus, NAA ([facility])     Status: None   Collection Time: 05/20/19  9:46 AM   Specimen: Nasopharyngeal(NP) swabs in vial transport medium   NASOPHARYNGE  TESTING  Result Value Ref Range Status   SARS-CoV-2, NAA Not Detected Not Detected Final    Comment: This nucleic acid amplification test was developed and its performance characteristics determined by [facility]. Nucleic acid amplification tests include PCR and TMA. This test has not been FDA cleared or approved. This test has been authorized by FDA under an Emergency Use Authorization (EUA). This test is only authorized for the duration of time the declaration that circumstances exist justifying the authorization of the emergency use of in vitro diagnostic tests for detection of SARS-CoV-2 virus and/or diagnosis of COVID-19 infection under section 564(b)(1) of the Act, 21 U.S.C. 161WRU-0(A) (1), unless the authorization is terminated or revoked sooner. When diagnostic testing is negative, the possibility of a false negative result should be considered in the context of a patient's recent exposures and the presence of clinical  signs and symptoms consistent with COVID-19. An individual without symptoms of COVID-19 and who is not shedding SARS-CoV-2 virus would  expect to have a negative (not detected) result in this assay.   Respiratory Panel by RT PCR (Flu A&B, Covid) - Nasopharyngeal Swab     Status: None   Collection Time: 05/21/19  3:41 PM   Specimen: Nasopharyngeal Swab  Result Value Ref Range Status   SARS Coronavirus 2 by RT PCR NEGATIVE NEGATIVE Final    Comment: (NOTE) SARS-CoV-2 target nucleic acids are NOT DETECTED. The SARS-CoV-2 RNA is generally detectable in upper respiratoy specimens during the acute phase of infection. The lowest concentration of SARS-CoV-2 viral copies this assay can detect is 131 copies/mL. A negative result does not preclude SARS-Cov-2 infection and should not be used as the sole basis for treatment or other patient management decisions. A negative result may occur with  improper specimen collection/handling, submission of specimen other than nasopharyngeal swab, presence of viral mutation(s) within the areas targeted by this assay, and inadequate number of viral copies (<131 copies/mL). A negative result must be combined with clinical observations, patient history, and epidemiological information. The expected result is Negative. Fact Sheet for Patients:  PinkCheek.be Fact Sheet for Healthcare Providers:  GravelBags.it This test is not yet ap proved or cleared by the [place] FDA and  has been authorized for detection and/or diagnosis of SARS-CoV-2 by FDA under an Emergency Use Authorization (EUA). This EUA will remain  in effect (meaning this test can be used) for the duration of the COVID-19 declaration under Section 564(b)(1) of the Act, 21 U.S.C. section 360bbb-3(b)(1), unless the authorization is terminated or revoked sooner.    Influenza A by PCR NEGATIVE NEGATIVE Final   Influenza B by PCR NEGATIVE  NEGATIVE Final    Comment: (NOTE) The Xpert Xpress SARS-CoV-2/FLU/RSV assay is intended as an aid in  the diagnosis of influenza from Nasopharyngeal swab specimens and  should not be used as a sole basis for treatment. Nasal washings and  aspirates are unacceptable for Xpert Xpress SARS-CoV-2/FLU/RSV  testing. Fact Sheet for Patients: PinkCheek.be Fact Sheet for Healthcare Providers: GravelBags.it This test is not yet approved or cleared by the [place] FDA and  has been authorized for detection and/or diagnosis of SARS-CoV-2 by  FDA under an Emergency Use Authorization (EUA). This EUA will remain  in effect (meaning this test can be used) for the duration of the  Covid-19 declaration under Section 564(b)(1) of the Act, 21  U.S.C. section 360bbb-3(b)(1), unless the authorization is  terminated or revoked. Performed at [facility], [address], [place], [place] [address]   Urine culture     Status: Abnormal   Collection Time: 05/21/19 11:17 PM   Specimen: Urine, Random  Result Value Ref Range Status   Specimen Description  Final    URINE, RANDOM Performed at [facility] [address] [place], [place] [address]    Special Requests   Final    NONE Performed at [facility], [address] [place], [place] [address]    Culture MULTIPLE SPECIES PRESENT, SUGGEST RECOLLECTION (A)  Final   Report Status 05/23/2019 FINAL  Final  SARS CORONAVIRUS 2 (TAT 6-24 HRS) Nasopharyngeal Nasopharyngeal Swab     Status: None   Collection Time: 05/23/19 11:15 PM   Specimen: Nasopharyngeal Swab  Result Value Ref Range Status   SARS Coronavirus 2 NEGATIVE NEGATIVE Final    Comment: (NOTE) SARS-CoV-2 target nucleic acids are NOT DETECTED. The SARS-CoV-2 RNA is generally detectable in upper and lower respiratory specimens during the acute phase of infection. Negative results do not preclude SARS-CoV-2 infection, do not rule  out co-infections with other pathogens, and should not be used as the sole basis for treatment or other patient management decisions. Negative results must be combined with clinical observations, patient history, and epidemiological information. The expected result is Negative. Fact Sheet for Patients: SugarRoll.be Fact Sheet for Healthcare Providers: https://www.woods-mathews.com/ This test is not yet approved or cleared by the [place] FDA and  has been authorized for detection and/or diagnosis of SARS-CoV-2 by FDA under an Emergency Use Authorization (EUA). This EUA will remain  in effect (meaning this test can be used) for the duration of the COVID-19 declaration under Section 56 4(b)(1) of the Act, 21 U.S.C. section 360bbb-3(b)(1), unless the authorization is terminated or revoked sooner. Performed at [facility], [place] [address] [place], [place] [address]   GI pathogen panel by PCR, stool     Status: Abnormal   Collection Time: 05/24/19 10:00 PM   Specimen: Stool  Result Value Ref Range Status   Plesiomonas shigelloides NOT DETECTED NOT DETECTED Final   Yersinia enterocolitica NOT DETECTED NOT DETECTED Final   Vibrio NOT DETECTED NOT DETECTED Final   Enteropathogenic E coli NOT DETECTED NOT DETECTED Final   E coli (ETEC) LT/ST NOT DETECTED NOT DETECTED Final   E coli 2297 by PCR Not applicable NOT DETECTED Final   Cryptosporidium by PCR DETECTED (A) NOT DETECTED Final   Entamoeba histolytica NOT DETECTED NOT DETECTED Final   Adenovirus F 40/41 NOT DETECTED NOT DETECTED Final   Norovirus GI/GII NOT DETECTED NOT DETECTED Final   Sapovirus NOT DETECTED NOT DETECTED Final    Comment: (NOTE) Performed At: [facility] [place], [place] [id number] Rush Farmer MD [id number]    Vibrio cholerae NOT DETECTED NOT DETECTED Final   Campylobacter by PCR NOT DETECTED NOT DETECTED Final   Salmonella by PCR  NOT DETECTED NOT DETECTED Final   E coli (STEC) NOT DETECTED NOT DETECTED Final   Enteroaggregative E coli NOT DETECTED NOT DETECTED Final   Shigella by PCR NOT DETECTED NOT DETECTED Final   Cyclospora cayetanensis NOT DETECTED NOT DETECTED Final   Astrovirus NOT DETECTED NOT DETECTED Final   G lamblia by PCR NOT DETECTED NOT DETECTED Final   Rotavirus A by PCR NOT DETECTED NOT DETECTED Final  C Difficile Quick Screen w PCR reflex     Status: None   Collection Time: 05/24/19 10:00 PM   Specimen: STOOL  Result Value Ref Range Status   C Diff antigen NEGATIVE NEGATIVE Final   C Diff toxin NEGATIVE NEGATIVE Final   C Diff interpretation No C. difficile detected.  Final    Comment: Performed at [facility],  [place], [place] [address]         Radiology Studies: No results found.      Scheduled Meds: . acetaminophen  1,000 mg Oral Q6H  . ARIPiprazole  10 mg Oral QHS  . atorvastatin  40 mg Oral Daily  . buPROPion  150 mg Oral q morning - 10a  . ferrous sulfate  325 mg Oral QODAY  . gabapentin  300 mg Oral QHS  . insulin aspart  0-15 Units Subcutaneous TID WC  . pantoprazole (PROTONIX) IV  40 mg Intravenous QHS  . vitamin B-12  1,000 mcg Oral Daily   Continuous Infusions: . ertapenem    . lactated ringers 125 mL/hr at 05/28/19 2244     LOS: 6 days    Time spent: 35 minutes    Ezekiel Slocumb, DO [facility]   If 7PM-7AM, please contact night-coverage [email or web address] 05/29/2019, 11:28 AM

## 2019-05-29 NOTE — Progress Notes (Addendum)
[facility] Day(s): 6.   Post op day(s): 1 Day Post-Op.   Interval History:  Patient seen and examined no acute events or new complaints overnight.  Patient reports she is feeling okay She does report incisional soreness, exacerbated with movement No fever, chills, nausea, or emeis Hgb 7.9; no signs of bleeding Leukocytosis to 14.8K; afebrile; most likely reactive from surgery Renal function at baseline; U/O - 1.2L  Surgical drain with 50 ccs out: serosanguinous On CLD; tolerating well; no reports of bowel function yet   Vital signs in last 24 hours: [min-max] current  Temp:  [97.1 F (36.2 C)-99.1 F (37.3 C)] 97.9 F (36.6 C) (01/27 0354) Pulse Rate:  [103-116] 111 (01/27 0354) Resp:  [11-22] 14 (01/27 0354) BP: (136-160)/(65-86) 136/74 (01/27 0354) SpO2:  [91 %-100 %] 100 % (01/27 0354) Weight:  [120.2 kg] 120.2 kg (01/26 1035)     Height: 5\' 10"  (177.8 cm) Weight: 120.2 kg BMI (Calculated): 38.02   Intake/Output last 2 shifts:  01/26 0701 - 01/27 0700 In: 2055 [P.O.:305; I.V.:1300; IV Piggyback:450] Out: 6734 [Urine:1255; Drains:110; Blood:50]   Physical Exam:  Constitutional: alert, cooperative and no distress  Respiratory: breathing non-labored at rest  Cardiovascular: regular rate and sinus rhythm  Gastrointestinal: soft, incisional soreness, and non-distended, no rebound/guarding, Surgical drain in RLQ with serosanguinous output Integumentary: Laparoscopic incisions are CDI with dermabond, no erythema or drainage  Labs:  CBC Latest Ref Rng & Units 05/29/2019 05/26/2019 05/25/2019  WBC 4.0 - 10.5 K/uL 14.8(H) 10.0 8.7  Hemoglobin 12.0 - 15.0 g/dL 7.9(L) 8.7(L) 7.9(L)  Hematocrit 36.0 - 46.0 % 26.2(L) 29.0(L) 26.2(L)  Platelets 150 - 400 K/uL 360 399 306   CMP Latest Ref Rng & Units 05/29/2019 05/25/2019 05/23/2019  Glucose 70 - 99 mg/dL 115(H) 129(H) 161(H)  BUN 6 - 20 mg/dL 10 13 16   Creatinine 0.44 - 1.00 mg/dL  1.17(H) 1.13(H) 1.44(H)  Sodium 135 - 145 mmol/L 137 136 129(L)  Potassium 3.5 - 5.1 mmol/L 4.3 4.8 4.4  Chloride 98 - 111 mmol/L 106 105 95(L)  CO2 22 - 32 mmol/L 20(L) 25 24  Calcium 8.9 - 10.3 mg/dL 8.6(L) 9.2 9.0  Total Protein 6.5 - 8.1 g/dL - - -  Total Bilirubin 0.3 - 1.2 mg/dL - - -  Alkaline Phos 38 - 126 U/L - - -  AST 15 - 41 U/L - - -  ALT 0 - 44 U/L - - -     Imaging studies: No new pertinent imaging studies   Assessment/Plan:  54 y.o. 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   - Continue CLD until bowel function returned   - Continue IVF   - Pain control prn (minimize narcotics if feasible); antiemetics prn  - Monitor abdominal examination; on-going bowel function  - Continue surgical rain; monitor and record output daily  - Discontinue foley catheter  - Monitor Hgb; do not suspect active bleeding; transfuse prn  - Monitor leukocytosis; most likely reactive; check in AM  - Mobilize early; engage PT if needed --> she does have recent foot injury which may limit mobility  - further management per primary service   - Hold DVT prophylaxis today given anemia on CBC; possibly restart tomorrow  All of the above  findings and recommendations were discussed with the patient, and the medical team, and all of patient's questions were answered to her expressed satisfaction.  -- Edison Simon, PA-C [facility] 05/29/2019, 7:39 AM [phone] M-F: 7am - 4pm

## 2019-05-29 NOTE — Evaluation (Signed)
Physical Therapy Evaluation Patient Details Name: [name] MRN: [id number] DOB: August 28, 1965 Today's Date: 05/29/2019   History of Present Illness  [name] is a 85yoF who comes to [facility] on 05/23/19 d/t Left leg pain. Patient was diagnosed with cellulitis few days ago and was started on antibiotics. Pt noted to have GIB c ABLA. 1/24 EGD and colonoscopy c both including biopsy. 1/26 sigmoid colectomy. Oncology reports no evidence of mets at this time. PMH: diabetes mellitus type 2, hypertension, hyperlipidemia, Charcot foot, schizoaffective disorders. Patient has had recent emergency department visits for both cellulitis as well as concern about patient's ability to care for self.  Clinical Impression  Pt admitted with above diagnosis. Pt currently with functional limitations due to the deficits listed below (see "PT Problem List"). Upon entry, pt in bed, awake and agreeable to participate. Pain from surgery yesterday is mild to moderate per patient report. The pt is alert and oriented x4, pleasant, conversational, and generally a good historian. Pt has difficulty moving to EOB without maxA physical assist, mostly for trunk support; MinA for rising to standing with RW, pt mildly apprehensive d/t fears of exacerbating her surgical pain. Once standing pt steady for gradual standing pivot to recliner. Gait deferred to later session. Cam rocker and surgicla shoe used for transfer per longstanding precautions. Functional mobility assessment demonstrates increased effort/time requirements, poor tolerance, and need for physical assistance, whereas the patient performed these at a higher level of independence PTA. Pt has not been able to AMB much recently due to using W/C for mobility while allowing foot to heel, but she is soon to be fitted for custom footwear and slated to progress ambulatory activity. Pt would do well in rehab to restore her strength and independence with basic ADL back to  baseline. Pt will benefit from skilled PT intervention to increase independence and safety with basic mobility in preparation for discharge to the venue listed below.       Follow Up Recommendations SNF;Supervision for mobility/OOB;Supervision - Intermittent    Equipment Recommendations  None recommended by PT    Recommendations for Other Services       Precautions / Restrictions Precautions Precautions: Fall Required Braces or Orthoses: Other Brace Other Brace: Charcot foot in CAM rocker; surgical shoe contralateral; JPJ; Restrictions Weight Bearing Restrictions: No      Mobility  Bed Mobility Overal bed mobility: Needs Assistance Bed Mobility: Supine to Sit     Supine to sit: Max assist     General bed mobility comments: trunk assist required d/t pain in ABD  Transfers Overall transfer level: Needs assistance Equipment used: Rolling walker (2 wheeled) Transfers: Sit to/from Stand Sit to Stand: Min assist            Ambulation/Gait Ambulation/Gait assistance: (deferred until visit 2)              Stairs            Wheelchair Mobility    Modified Rankin (Stroke Patients Only)       Balance Overall balance assessment: Needs assistance                                           Pertinent Vitals/Pain Pain Assessment: No/denies pain(mild soreness ABD surgical site with mobility, well controlled.)    Home Living Family/patient expects to be discharged to:: Private residence Living Arrangements: Children;Other relatives Available  Help at Discharge: Family;Available PRN/intermittently Type of Home: House Home Access: Ramped entrance     Home Layout: Two level;Able to live on main level with bedroom/bathroom Home Equipment: Grab bars - tub/shower;Walker - 2 wheels;Shower seat;Toilet riser;Wheelchair - manual Additional Comments: Living with family at this time, but her shower chauir and hand held shower head are still at her  house. W/C doesn't fit through BR door, so Pt AMB c RW from BR doorway to toilet. Pt sleeping on her DTR's couch.Pt reports she has not slept in a legit bed for >3 years, usually a couch.    Prior Function           Comments: Pt utilized RW for ambulation in home recently, has had trouble due to generalized weakness     Hand Dominance   Dominant Hand: Right    Extremity/Trunk Assessment   Upper Extremity Assessment Upper Extremity Assessment: Generalized weakness;Overall [facility] for tasks assessed    Lower Extremity Assessment Lower Extremity Assessment: Generalized weakness;Overall WFL for tasks assessed       Communication   Communication: HOH  Cognition Arousal/Alertness: Awake/alert Behavior During Therapy: WFL for tasks assessed/performed Overall Cognitive Status: Within Functional Limits for tasks assessed                                        General Comments      Exercises     Assessment/Plan    PT Assessment Patient needs continued PT services  PT Problem List Decreased strength;Decreased activity tolerance;Decreased balance;Decreased mobility       PT Treatment Interventions DME instruction;Balance training;Gait training;Functional mobility training;Therapeutic activities;Therapeutic exercise;Wheelchair mobility training;Patient/family education    PT Goals (Current goals can be found in the Care Plan section)  Acute Rehab PT Goals Patient Stated Goal: improve strength for return to home indepoendently PT Goal Formulation: With patient Time For Goal Achievement: 06/12/19 Potential to Achieve Goals: Good    Frequency Min 2X/week   Barriers to discharge Decreased caregiver support      Co-evaluation               AM-PAC PT "6 Clicks" Mobility  Outcome Measure Help needed turning from your back to your side while in a flat bed without using bedrails?: A Lot Help needed moving from lying on your back to sitting on the side of  a flat bed without using bedrails?: Total Help needed moving to and from a bed to a chair (including a wheelchair)?: A Little Help needed standing up from a chair using your arms (e.g., wheelchair or bedside chair)?: A Little Help needed to walk in hospital room?: A Little Help needed climbing 3-5 steps with a railing? : A Lot 6 Click Score: 14    End of Session Equipment Utilized During Treatment: Oxygen Activity Tolerance: Patient tolerated treatment well;Patient limited by pain;Patient limited by fatigue Patient left: in chair;with call bell/phone within reach;with chair alarm set Nurse Communication: Mobility status PT Visit Diagnosis: Unsteadiness on feet (R26.81);Other abnormalities of gait and mobility (R26.89);Difficulty in walking, not elsewhere classified (R26.2)    Time: 2536-6440 PT Time Calculation (min) (ACUTE ONLY): 38 min   Charges:   PT Evaluation $PT Eval High Complexity: 1 High PT Treatments $Therapeutic Exercise: 8-22 mins        5:20 PM, 05/29/19 Etta Grandchild, PT, DPT Physical Therapist - [facility]  [phone]  ASCOM)   Zimere Dunlevy C 05/29/2019, 5:17 PM

## 2019-05-29 NOTE — Consult Note (Signed)
[place]  [phone] [phone]  ID: [name] OB: [birth year]  MR#: [id number]  CSN#:[id number]  Patient Care Team: [name], DO as PCP - General (Family Medicine) Floria Raveling, MD as Referring Physician (Psychiatry) [place], Virl Diamond, [facility] as Pharmacist (Pharmacist) Edrick Kins, MD as Rounding Team (Internal Medicine) Vanita Ingles, RN as Case Manager ([place]) Greg Cutter, LCSW as Social Worker (Licensed Clinical Social Worker)  CHIEF COMPLAINT: Colon cancer  INTERVAL HISTORY: Patient is a 54 year old female who initially presented with cellulitis of her left lower leg but then was then noted to have a drop in her hemoglobin and heme positive stools.  Subsequent colonoscopy revealed a large sigmoid colon mass with pathology consistent with colon cancer.  Patient underwent surgical resection yesterday.  Currently, she continues to have abdominal soreness at her surgical site but otherwise feels well.  He has no neurologic complaints.  She denies any recent fevers.  She has a fair appetite, but denies weight loss.  She has no chest pain, shortness of breath, cough, or hemoptysis.  She has no nausea, vomiting, constipation, or diarrhea.  She has no urinary complaints.  Patient otherwise feels well and offers no further specific complaints today.  REVIEW OF SYSTEMS:   Review of Systems  Constitutional: Negative.  Negative for fever, malaise/fatigue and weight loss.  Respiratory: Negative.  Negative for cough, hemoptysis and shortness of breath.   Cardiovascular: Negative.  Negative for chest pain and leg swelling.  Gastrointestinal: Positive for abdominal pain. Negative for blood in stool and melena.  Genitourinary: Negative.  Negative for hematuria.  Musculoskeletal: Negative.  Negative for back pain.  Skin: Negative.  Negative for rash.  Neurological: Negative.  Negative for dizziness, focal  weakness, weakness and headaches.  Psychiatric/Behavioral: Negative.  The patient is not nervous/anxious.     As per HPI. Otherwise, a complete review of systems is negative.  PAST MEDICAL HISTORY: Past Medical History:  Diagnosis Date  . Depression   . Diabetes mellitus without complication ([place])   . Hyperlipidemia   . Hypertension   . Neuromuscular disorder ([place])   . Schizo affective schizophrenia ([place])    abilify and pazil    PAST SURGICAL HISTORY: Past Surgical History:  Procedure Laterality Date  . CHOLECYSTECTOMY    . COLONOSCOPY WITH PROPOFOL N/A 05/26/2019   Procedure: COLONOSCOPY WITH PROPOFOL;  Surgeon: Toledo, Benay Pike, MD;  Location: [facility] ENDOSCOPY;  Service: Gastroenterology;  Laterality: N/A;  . ESOPHAGOGASTRODUODENOSCOPY (EGD) WITH PROPOFOL N/A 05/26/2019   Procedure: ESOPHAGOGASTRODUODENOSCOPY (EGD) WITH PROPOFOL;  Surgeon: Toledo, Benay Pike, MD;  Location: [facility] ENDOSCOPY;  Service: Gastroenterology;  Laterality: N/A;  . IRRIGATION AND DEBRIDEMENT FOOT Right 02/26/2016   Procedure: RIGHT 2ND TOE DEBRIDEMENT;  Surgeon: Samara Deist, DPM;  Location: [facility] ORS;  Service: Podiatry;  Laterality: Right;  . IRRIGATION AND DEBRIDEMENT FOOT Left 12/13/2018   Procedure: IRRIGATION AND DEBRIDEMENT FOOT;  Surgeon: Caroline More, DPM;  Location: [facility] ORS;  Service: Podiatry;  Laterality: Left;    FAMILY HISTORY: Family History  Problem Relation Age of Onset  . Breast cancer Cousin        maternal cousin  . Hypertension Mother   . Hyperthyroidism Mother   . Diabetes Maternal Grandmother   . Hypertension Maternal Grandmother   . Cancer Maternal Grandmother        unknown type  . Diabetes Maternal Grandfather   . Hypertension Maternal Grandfather   . Diabetes Paternal Grandmother   . Hypertension  Paternal Grandmother   . Diabetes Paternal Grandfather   . Hypertension Paternal Grandfather     ADVANCED DIRECTIVES (Y/N):  @ADVDIR @  HEALTH MAINTENANCE: Social History    Tobacco Use  . Smoking status: Former Smoker    Packs/day: 3.00    Years: 30.00    Pack years: 90.00    Types: Cigarettes    Quit date: 05/02/2009    Years since quitting: 10.0  . Smokeless tobacco: Former Systems developer  . Tobacco comment: Pt reported  quitting in 2011  Substance Use Topics  . Alcohol use: No  . Drug use: No     Colonoscopy:  PAP:  Bone density:  Lipid panel:  No Known Allergies  Current Facility-Administered Medications  Medication Dose Route Frequency Provider Last Rate Last Admin  . acetaminophen (TYLENOL) tablet 1,000 mg  1,000 mg Oral Q6H Ronny Bacon, MD   1,000 mg at 05/29/19 0529  . ARIPiprazole (ABILIFY) tablet 10 mg  10 mg Oral QHS Cristescu, Linard Millers, MD   10 mg at 05/28/19 2118  . atorvastatin (LIPITOR) tablet 40 mg  40 mg Oral Daily Cristescu, Linard Millers, MD   40 mg at 05/29/19 0950  . buPROPion (WELLBUTRIN XL) 24 hr tablet 150 mg  150 mg Oral q morning - 10a Cristescu, Linard Millers, MD   150 mg at 05/29/19 0950  . ertapenem (INVANZ) 1,000 mg in sodium chloride 0.9 % 100 mL IVPB  1 g Intravenous Q24H Ronny Bacon, MD      . ferrous sulfate tablet 325 mg  325 mg Oral Tonye Pearson, MD   325 mg at 05/26/19 1345  . gabapentin (NEURONTIN) capsule 300 mg  300 mg Oral QHS Jennye Boroughs, MD   300 mg at 05/28/19 2118  . guaiFENesin (MUCINEX) 12 hr tablet 600 mg  600 mg Oral BID Nicole Kindred A, DO      . insulin aspart (novoLOG) injection 0-15 Units  0-15 Units Subcutaneous TID WC Cristescu, Linard Millers, MD   3 Units at 05/27/19 1218  . lactated ringers infusion   Intravenous Continuous Ronny Bacon, MD 125 mL/hr at 05/28/19 2244 New Bag at 05/28/19 2244  . morphine 2 MG/ML injection 2 mg  2 mg Intravenous Q1H PRN Ronny Bacon, MD   2 mg at 05/29/19 0305  . ondansetron (ZOFRAN) tablet 4 mg  4 mg Oral Q6H PRN Cristescu, Mircea G, MD   4 mg at 05/29/19 0305   Or  . ondansetron (ZOFRAN) injection 4 mg  4 mg Intravenous Q6H PRN Cristescu, Mircea G, MD       . pantoprazole (PROTONIX) injection 40 mg  40 mg Intravenous QHS Ronny Bacon, MD   40 mg at 05/28/19 2118  . vitamin B-12 (CYANOCOBALAMIN) tablet 1,000 mcg  1,000 mcg Oral Daily Jennye Boroughs, MD   1,000 mcg at 05/29/19 0950    OBJECTIVE: Vitals:   05/29/19 0354 05/29/19 0755  BP: 136/74 123/66  Pulse: (!) 111 (!) 103  Resp: 14 18  Temp: 97.9 F (36.6 C) 98.2 F (36.8 C)  SpO2: 100% 97%     Body mass index is 38.02 kg/m.    ECOG FS:1 - Symptomatic but completely ambulatory  General: Well-developed, well-nourished, no acute distress. Eyes: Pink conjunctiva, anicteric sclera. HEENT: Normocephalic, moist mucous membranes. Lungs: No audible wheezing or coughing. Heart: Regular rate and rhythm. Abdomen: Soft, nontender, no obvious distention.  Surgical dressing in place. Musculoskeletal: No edema, cyanosis, or clubbing. Neuro: Alert, answering all questions appropriately. Cranial nerves  grossly intact. Skin: No rashes or petechiae noted. Psych: Normal affect.  LAB RESULTS:  Lab Results  Component Value Date   NA 137 05/29/2019   K 4.3 05/29/2019   CL 106 05/29/2019   CO2 20 (L) 05/29/2019   GLUCOSE 115 (H) 05/29/2019   BUN 10 05/29/2019   CREATININE 1.17 (H) 05/29/2019   CALCIUM 8.6 (L) 05/29/2019   PROT 6.6 05/22/2019   ALBUMIN 3.2 (L) 05/22/2019   AST 25 05/22/2019   ALT 34 05/22/2019   ALKPHOS 102 05/22/2019   BILITOT 0.5 05/22/2019   GFRNONAA 52 (L) 05/29/2019   GFRAA >60 05/29/2019    Lab Results  Component Value Date   WBC 14.8 (H) 05/29/2019   NEUTROABS 5.4 05/24/2019   HGB 7.9 (L) 05/29/2019   HCT 26.2 (L) 05/29/2019   MCV 79.9 (L) 05/29/2019   PLT 360 05/29/2019     STUDIES: CT Angio Chest PE W and/or Wo Contrast  Result Date: 05/21/2019 CLINICAL DATA:  Shortness of breath. Positive D-dimer. Weakness. Tachycardia. Headache. COVID-19 negative earlier today. EXAM: CT ANGIOGRAPHY CHEST WITH CONTRAST TECHNIQUE: Multidetector CT imaging of the  chest was performed using the standard protocol during bolus administration of intravenous contrast. Multiplanar CT image reconstructions and MIPs were obtained to evaluate the vascular anatomy. CONTRAST:  73mL OMNIPAQUE IOHEXOL 350 MG/ML SOLN COMPARISON:  Chest radiograph earlier today.  CTA chest 04/12/2019 FINDINGS: Cardiovascular: The quality of this exam for evaluation of pulmonary embolism is moderate. The bolus is centered in the SVC. No pulmonary embolism to the large segmental level. Aortic atherosclerosis. No dissection. Mild cardiomegaly, without pericardial effusion. Proximal LAD and possible left main coronary artery atherosclerosis, including on 47/4. Mediastinum/Nodes: No mediastinal or hilar adenopathy. Lungs/Pleura: No pleural fluid.  Clear lungs. Upper Abdomen: Cholecystectomy. Normal imaged portions of the liver, spleen, stomach, pancreas, kidneys, right adrenal gland. Left adrenal nodule of 1.4 cm, present back to 08/19/2012 abdominal CT. Musculoskeletal: Midthoracic spondylosis. Mild right hemidiaphragm elevation. Review of the MIP images confirms the above findings. IMPRESSION: 1. Moderate quality evaluation for pulmonary embolism. No pulmonary embolism to the large segmental level. 2. Age advanced coronary artery atherosclerosis. Recommend assessment of coronary risk factors and consideration of medical therapy. 3. Left adrenal nodule, similar to 08/19/2012 and therefore presumably an adenoma. Electronically Signed   By: Abigail Miyamoto M.D.   On: 05/21/2019 20:09   CT ABDOMEN PELVIS W CONTRAST  Result Date: 05/26/2019 CLINICAL DATA:  Colorectal cancer staging, sigmoid mass on colonoscopy today RIGHT lower quadrant pain and loss of appetite for 2 weeks EXAM: CT ABDOMEN AND PELVIS WITH CONTRAST TECHNIQUE: Multidetector CT imaging of the abdomen and pelvis was performed using the standard protocol following bolus administration of intravenous contrast. Sagittal and coronal MPR images  reconstructed from axial data set. CONTRAST:  113mL OMNIPAQUE IOHEXOL 300 MG/ML SOLN IV. No oral contrast was administered COMPARISON:  CT angio chest 05/21/2019, CT abdomen and pelvis 08/19/2012 FINDINGS: Lower chest: Minimal subsegmental atelectasis LEFT lower lobe Hepatobiliary: Gallbladder surgically absent. Liver normal appearance. No definite hepatic mass lesion or nodularity. Pancreas: Normal appearance Spleen: Normal appearance Adrenals/Urinary Tract: LEFT adrenal nodule 18 x 13 mm image 29 previously 18 x 11 mm. RIGHT adrenal gland unremarkable. Kidneys, ureters, and bladder normal appearance Stomach/Bowel: Normal appendix. Slight thickening of the sigmoid colon approximately 3 cm transverse over approximately 4 cm length question neoplasm identified at colonoscopy. Minimal adjacent edema, nonspecific, could potentially be related to preceding endoscopic procedure. No other focal colonic abnormalities seen. Stomach and  small bowel loops normal appearance. Vascular/Lymphatic: Mild atherosclerotic calcifications aorta without aneurysm. No adenopathy. Reproductive: Normal appearance of uterus and LEFT ovary. Fat containing mass in RIGHT adnexa consistent with dermoid tumor, 4.2 x 3.1 x 3.5 cm image 86. Other: No free air or free fluid. No hernia or inflammatory process. Musculoskeletal: Unremarkable IMPRESSION: Thickened segment of sigmoid colon approximately 3 cm diameter by 4 cm length question neoplasm identified by colonoscopy. Nonspecific mild infiltration of pericolic fat adjacent to the thickened sigmoid segment, nonspecific in the setting of preceding endoscopy. 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 Stable LEFT adrenal nodule since 2014 favors a benign etiology. No definite evidence of metastatic disease. Aortic Atherosclerosis (ICD10-I70.0). Electronically Signed   By: Lavonia Dana M.D.   On: 05/26/2019 17:53   US Venous Img Lower Unilateral Right (DVT)  Result Date: 05/23/2019 CLINICAL  DATA:  Leg swelling.  Pain. EXAM: RIGHT LOWER EXTREMITY VENOUS DOPPLER ULTRASOUND TECHNIQUE: Gray-scale sonography with graded compression, as well as color Doppler and duplex ultrasound were performed to evaluate the lower extremity deep venous systems from the level of the common femoral vein and including the common femoral, femoral, profunda femoral, popliteal and calf veins including the posterior tibial, peroneal and gastrocnemius veins when visible. The superficial great saphenous vein was also interrogated. Spectral Doppler was utilized to evaluate flow at rest and with distal augmentation maneuvers in the common femoral, femoral and popliteal veins. COMPARISON:  None. FINDINGS: Contralateral Common Femoral Vein: Respiratory phasicity is normal and symmetric with the symptomatic side. No evidence of thrombus. Normal compressibility. Common Femoral Vein: No evidence of thrombus. Normal compressibility, respiratory phasicity and response to augmentation. Saphenofemoral Junction: No evidence of thrombus. Normal compressibility and flow on color Doppler imaging. Profunda Femoral Vein: No evidence of thrombus. Normal compressibility and flow on color Doppler imaging. Femoral Vein: No evidence of thrombus. Normal compressibility, respiratory phasicity and response to augmentation. Popliteal Vein: No evidence of thrombus. Normal compressibility, respiratory phasicity and response to augmentation. Calf Veins: Evaluation of the peroneal vein was limited by suboptimal compression. There is no obvious thrombus involving the tibial veins. Superficial Great Saphenous Vein: No evidence of thrombus. Normal compressibility. Venous Reflux:  None. Other Findings:  None. IMPRESSION: No evidence of deep venous thrombosis. Electronically Signed   By: Constance Holster M.D.   On: 05/23/2019 21:05   DG Chest Portable 1 View  Result Date: 05/21/2019 CLINICAL DATA:  Cough and fever EXAM: PORTABLE CHEST 1 VIEW COMPARISON:   May 21, 2019 FINDINGS: There is mild elevation of the right hemidiaphragm. Lungs are clear. Heart size and pulmonary vascularity are normal. No adenopathy. No bone lesions. IMPRESSION: Mild elevation of right hemidiaphragm. No edema or consolidation. Cardiac silhouette normal. No adenopathy. Electronically Signed   By: Lowella Grip III M.D.   On: 05/21/2019 16:10    ASSESSMENT: Colon cancer.  PLAN:    1.  Colon cancer: Unclear stage, but CT scan from earlier this admission did not reveal any metastatic disease.  Will await final pathology to determine whether patient will require adjuvant chemotherapy.  Preoperative CEA was within normal limits.  No intervention is needed at this time.  Will arrange follow-up with the patient in the [place] approximately 1 week after discharge. 2.  Anemia: Likely secondary to blood loss from colon cancer.  Her most recent hemoglobin was 7.9.  Continue to monitor and transfuse if hemoglobin falls below 7.0. 3.  Disposition: Okay to discharge from an oncology standpoint.  Follow-up  as above.  Call with questions.  Lloyd Huger, MD   05/29/2019 11:55 AM

## 2019-05-30 LAB — GLUCOSE, CAPILLARY
Glucose-Capillary: 139 mg/dL — ABNORMAL HIGH (ref 70–99)
Glucose-Capillary: 206 mg/dL — ABNORMAL HIGH (ref 70–99)
Glucose-Capillary: 169 mg/dL — ABNORMAL HIGH (ref 70–99)
Glucose-Capillary: 142 mg/dL — ABNORMAL HIGH (ref 70–99)

## 2019-05-30 LAB — BASIC METABOLIC PANEL
Anion gap: 9 (ref 5–15)
BUN: 10 mg/dL (ref 6–20)
CO2: 23 mmol/L (ref 22–32)
Calcium: 9.3 mg/dL (ref 8.9–10.3)
Chloride: 105 mmol/L (ref 98–111)
Creatinine, Ser: 0.99 mg/dL (ref 0.44–1.00)
GFR calc Af Amer: >60 mL/min (ref >60)
GFR calc non Af Amer: >60 mL/min (ref >60)
Glucose, Bld: 141 mg/dL — ABNORMAL HIGH (ref 70–99)
Potassium: 4.7 mmol/L (ref 3.5–5.1)
Sodium: 137 mmol/L (ref 135–145)

## 2019-05-30 LAB — CBC
HCT: 27.7 % — ABNORMAL LOW (ref 36.0–46.0)
Hemoglobin: 8.3 g/dL — ABNORMAL LOW (ref 12.0–15.0)
MCH: 23.9 pg — ABNORMAL LOW (ref 26.0–34.0)
MCHC: 30 g/dL (ref 30.0–36.0)
MCV: 79.8 fL — ABNORMAL LOW (ref 80.0–100.0)
Platelets: 401 10*3/uL — ABNORMAL HIGH (ref 150–400)
RBC: 3.47 MIL/uL — ABNORMAL LOW (ref 3.87–5.11)
RDW: 16.5 % — ABNORMAL HIGH (ref 11.5–15.5)
WBC: 11.2 10*3/uL — ABNORMAL HIGH (ref 4.0–10.5)
nRBC: 0 % (ref 0.0–0.2)

## 2019-05-30 LAB — MAGNESIUM: Magnesium: 1.7 mg/dL (ref 1.7–2.4)

## 2019-05-30 MED ORDER — ENOXAPARIN SODIUM 40 MG/0.4ML ~~LOC~~ SOLN
40.0000 mg | SUBCUTANEOUS | Status: DC
  Administered 2019-05-30 – 2019-06-03 (×5): 40 mg via SUBCUTANEOUS
  Filled 2019-05-30 (×5): qty 0.4

## 2019-05-30 NOTE — Plan of Care (Signed)
  Problem: Education: Goal: Knowledge of General Education information will improve Description: Including pain rating scale, medication(s)/side effects and non-pharmacologic comfort measures 05/30/2019 0246 by Noah Delaine, RN Outcome: Progressing 05/30/2019 0246 by Noah Delaine, RN Outcome: Progressing   Problem: Health Behavior/Discharge Planning: Goal: Ability to manage health-related needs will improve 05/30/2019 0246 by Noah Delaine, RN Outcome: Progressing 05/30/2019 0246 by Noah Delaine, RN Outcome: Progressing   Problem: Clinical Measurements: Goal: Ability to maintain clinical measurements within normal limits will improve 05/30/2019 0246 by Noah Delaine, RN Outcome: Progressing 05/30/2019 0246 by Noah Delaine, RN Outcome: Progressing Goal: Will remain free from infection 05/30/2019 0246 by Noah Delaine, RN Outcome: Progressing 05/30/2019 0246 by Noah Delaine, RN Outcome: Progressing Goal: Diagnostic test results will improve 05/30/2019 0246 by Noah Delaine, RN Outcome: Progressing 05/30/2019 0246 by Noah Delaine, RN Outcome: Progressing Goal: Respiratory complications will improve 05/30/2019 0246 by Noah Delaine, RN Outcome: Progressing 05/30/2019 0246 by Noah Delaine, RN Outcome: Progressing Goal: Cardiovascular complication will be avoided 05/30/2019 0246 by Noah Delaine, RN Outcome: Progressing 05/30/2019 0246 by Noah Delaine, RN Outcome: Progressing   Problem: Activity: Goal: Risk for activity intolerance will decrease 05/30/2019 0246 by Noah Delaine, RN Outcome: Progressing 05/30/2019 0246 by Noah Delaine, RN Outcome: Progressing   Problem: Nutrition: Goal: Adequate nutrition will be maintained 05/30/2019 0246 by Noah Delaine, RN Outcome: Progressing 05/30/2019 0246 by Noah Delaine, RN Outcome: Progressing   Problem: Coping: Goal: Level of anxiety will decrease 05/30/2019  0246 by Noah Delaine, RN Outcome: Progressing 05/30/2019 0246 by Noah Delaine, RN Outcome: Progressing   Problem: Elimination: Goal: Will not experience complications related to bowel motility 05/30/2019 0246 by Noah Delaine, RN Outcome: Progressing 05/30/2019 0246 by Noah Delaine, RN Outcome: Progressing Goal: Will not experience complications related to urinary retention 05/30/2019 0246 by Noah Delaine, RN Outcome: Progressing 05/30/2019 0246 by Noah Delaine, RN Outcome: Progressing   Problem: Pain Managment: Goal: General experience of comfort will improve 05/30/2019 0246 by Noah Delaine, RN Outcome: Progressing 05/30/2019 0246 by Noah Delaine, RN Outcome: Progressing   Problem: Safety: Goal: Ability to remain free from injury will improve 05/30/2019 0246 by Noah Delaine, RN Outcome: Progressing 05/30/2019 0246 by Noah Delaine, RN Outcome: Progressing   Problem: Skin Integrity: Goal: Risk for impaired skin integrity will decrease 05/30/2019 0246 by Noah Delaine, RN Outcome: Progressing 05/30/2019 0246 by Noah Delaine, RN Outcome: Progressing

## 2019-05-30 NOTE — NC FL2 (Signed)
[place] LEVEL OF CARE SCREENING TOOL     IDENTIFICATION  Patient Name: [name] Birthdate: 1965/07/25 Sex: female Admission Date (Current Location): 05/23/2019  [place] and [place] Number:  Engineer, manufacturing systems and Address:  [facility], [address], [place], [place] [address]      Provider Number: [id number]  Attending Physician Name and Address:  Ronny Bacon, MD  Relative Name and Phone Number:  Terrace Arabia [phone]    Current Level of Care: Hospital Recommended Level of Care: [place] Prior Approval Number:    Date Approved/Denied:   PASRR Number:    Discharge Plan: SNF    Current Diagnoses: Patient Active Problem List   Diagnosis Date Noted  . 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[place]) 05/28/2019  . Colon cancer ([place]) 05/26/2019  . Anemia   . Cellulitis in diabetic foot ([place]) 05/23/2019  . Hyponatremia 05/23/2019  . GI bleed 05/23/2019  . Cellulitis 05/23/2019  . Neuropathic foot ulcer, left, with fat layer exposed ([place]) 02/05/2019  . Hyperkalemia 02/05/2019  . Osteomyelitis ([place]) 12/11/2018  . Pyelonephritis 12/14/2017  . Morbid obesity ([place]) 05/10/2017  . Long-term use of high-risk medication 05/10/2017  . Hyperlipidemia associated with type 2 diabetes mellitus ([place]) 06/27/2016  . Hammer toe of second toe of right foot 04/20/2016  . Essential hypertension 06/29/2015  . Schizoaffective disorder ([place]) 06/29/2015  . DM (diabetes mellitus), type 2, 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[place]) 15/40/0867  . Polyneuropathy 04/02/2014    Orientation RESPIRATION BLADDER Height & Weight     Self, Time, Situation, Place  Normal Continent Weight: 265 lb (120.2 kg) Height:  5\' 10"  (177.8 cm)  BEHAVIORAL SYMPTOMS/MOOD NEUROLOGICAL BOWEL NUTRITION STATUS      Continent Diet(heart healthy thin liquids)  AMBULATORY STATUS COMMUNICATION OF NEEDS Skin   Extensive Assist Verbally  Surgical wounds                       Personal Care Assistance Level of Assistance  Bathing, Dressing Bathing Assistance: Limited assistance   Dressing Assistance: Limited assistance     Functional Limitations Info             SPECIAL CARE FACTORS FREQUENCY  OT (By licensed OT), PT (By licensed PT)     PT Frequency: 5x week OT Frequency: 5x week            Contractures Contractures Info: Not present    Additional Factors Info  Code Status, Allergies Code Status Info: Full Code Allergies Info: No Known Allergies           Current Medications (05/30/2019):  This is the current hospital active medication list Current Facility-Administered Medications  Medication Dose Route Frequency Provider Last Rate Last Admin  . acetaminophen (TYLENOL) tablet 1,000 mg  1,000 mg Oral Q6H Ronny Bacon, MD   1,000 mg at 05/30/19 0530  . ARIPiprazole (ABILIFY) tablet 10 mg  10 mg Oral QHS Cristescu, Linard Millers, MD   10 mg at 05/29/19 2203  . atorvastatin (LIPITOR) tablet 40 mg  40 mg Oral Daily Cristescu, Linard Millers, MD   40 mg at 05/30/19 0858  . buPROPion (WELLBUTRIN XL) 24 hr tablet 150 mg  150 mg Oral q morning - 10a Cristescu, Linard Millers, MD   150 mg at 05/30/19 0859  . ertapenem (INVANZ) 1,000 mg in sodium chloride 0.9 % 100 mL IVPB  1 g Intravenous Q24H Ronny Bacon, MD 200 mL/hr at 05/29/19 1237 1,000 mg  at 05/29/19 1237  . ferrous sulfate tablet 325 mg  325 mg Oral Tonye Pearson, MD   325 mg at 05/30/19 0857  . gabapentin (NEURONTIN) capsule 300 mg  300 mg Oral QHS Jennye Boroughs, MD   300 mg at 05/29/19 2203  . insulin aspart (novoLOG) injection 0-15 Units  0-15 Units Subcutaneous TID WC Cristescu, Linard Millers, MD   2 Units at 05/30/19 0856  . lactated ringers infusion   Intravenous Continuous Tylene Fantasia, PA-C 125 mL/hr at 05/29/19 1448 New Bag at 05/29/19 1448  . morphine 2 MG/ML injection 2 mg  2 mg Intravenous Q1H PRN Ronny Bacon, MD   2 mg at 05/29/19  0305  . ondansetron (ZOFRAN) tablet 4 mg  4 mg Oral Q6H PRN Cristescu, Mircea G, MD   4 mg at 05/29/19 0305   Or  . ondansetron (ZOFRAN) injection 4 mg  4 mg Intravenous Q6H PRN Cristescu, Linard Millers, MD   4 mg at 05/30/19 0540  . pantoprazole (PROTONIX) injection 40 mg  40 mg Intravenous QHS Ronny Bacon, MD   40 mg at 05/29/19 2203  . vitamin B-12 (CYANOCOBALAMIN) tablet 1,000 mcg  1,000 mcg Oral Daily Jennye Boroughs, MD   1,000 mcg at 05/30/19 8628     Discharge Medications: Please see discharge summary for a list of discharge medications.  Relevant Imaging Results:  Relevant Lab Results:   Additional Information SSN: [id number]  [name], Gardiner Rhyme, LCSW

## 2019-05-30 NOTE — TOC Progression Note (Signed)
Transition of Care [facility]) - Progression Note    Patient Details  Name: SHANTAYA BLUESTONE MRN: [id number] Date of Birth: 01/12/1966  [facility]) CM/SW Contact  Carlton Sweaney, Gardiner Rhyme, LCSW Phone Number: 05/30/2019, 2:23 PM  Clinical Narrative:  Have faxed information to both [facility] and [facility]. [facility] does not accept pt's [facility] Medicare. Trying to get bed offer from one of these facilities. Informed pt of this.     Expected Discharge Plan: Home/Self Care Barriers to Discharge: Continued Medical Work up  Expected Discharge Plan and Services Expected Discharge Plan: Home/Self Care In-house Referral: Clinical Social Work Discharge Planning Services: NA   Living arrangements for the past 2 months: Single Family Home                                       Social Determinants of Health (SDOH) Interventions    Readmission Risk Interventions Readmission Risk Prevention Plan 05/24/2019 10/21/2018 10/18/2018  Post Dischage Appt - Complete -  Medication Screening - Complete -  Transportation Screening Complete Complete Complete  PCP or Specialist Appt within 5-7 Days - - -  Home Care Screening - - Complete  Medication Review (RN CM) - - Complete  Medication Review ([place]) Complete - -  PCP or Specialist appointment within 3-5 days of discharge Complete - -  [facility] or [place] (No Data) - -  [place] Not Applicable - -  Some recent data might be hidden

## 2019-05-30 NOTE — TOC Progression Note (Addendum)
Transition of Care [facility]) - Progression Note    Patient Details  Name: [name] MRN: [id number] Date of Birth: October 03, 1965  Transition of [facility]) CM/SW Contact  Tahiri Shareef, Gardiner Rhyme, LCSW Phone Number: 05/30/2019, 9:02 AM  Clinical Narrative: Met with pt to discuss discharge plans. She is in agreement with PT recommendation. Discussed will need to do Pass[facility], FL2 and bed search. She prefers to go in [place] due to her sister feels better facilities. Discussed will need to see what bed offers receive and will go from there. Pt will talk with sister to see if has a preference regarding which facility. Will need to get [facility] level II due to psych history. 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 Begin process.   9:06 am Faxed  Passar additional info to determine level II status  12:00 sister called to tell this worker her SNF pref-[facility] all in [place] and [place]. Discussed will need to be in [employer] network and offer a bed. Pt has been to [facility] before. Sister feels needs to be close to a big hospital for her follow up care  Expected Discharge Plan: Home/Self Care Barriers to Discharge: Continued Medical Work up  Expected Discharge Plan and Services Expected Discharge Plan: Home/Self Care In-house Referral: Clinical Social Work Discharge Planning Services: NA   Living arrangements for the past 2 months: Single Family Home                                       Social Determinants of Health (SDOH) Interventions    Readmission Risk Interventions Readmission Risk Prevention Plan 05/24/2019 10/21/2018 10/18/2018  Post Dischage Appt - Complete -  Medication Screening - Complete -  Transportation Screening Complete Complete Complete  PCP or Specialist Appt within 5-7 Days - - -  Home Care Screening - - Complete  Medication Review (RN  CM) - - Complete  Medication Review ([place]) Complete - -  PCP or Specialist appointment within 3-5 days of discharge Complete - -  [place] or [place] (No Data) - -  [place] Not Applicable - -  Some recent data might be hidden

## 2019-05-30 NOTE — Progress Notes (Addendum)
PROGRESS NOTE    [name]  BBC:[id number] DOB: 1965/08/09 DOA: 05/23/2019  PCP: [name], DO    LOS - 7   Brief Narrative:  [name] an 54 y.o.femalewith medical history significant ofdiabetes mellitus type 2, hypertension, hyperlipidemia, Charcot foot, schizoaffective disorder, presented to the ED on 05/22/19, accompanied by daughter because of concern for possible sepsis and right lower leg cellulitis. Patient was diagnosed with cellulitis few days prior and had been started on Keflex.  Patient had also had recent ED visits for both cellulitis as well as concern about patient's ability to care for self.  In the ED, duplex ultrasound negative for DVT, left lower extremity cellulitis confirmed.  Hemoglobin 8 from 10 previously, with positive heme occult stool.  Admitted to hospitalist service with GI consulted.  EGD and colonoscopy were performed on 05/26/19.  EGD showed irregular z-line, few gastric polyps.  Colonoscopy showed a likely malignant obstructing tumor in the mid-sigmoid colon.  General surgery consulted and patient underwent sigmoid colectomy on 05/28/19.  Subjective 1/28: Patient seen this AM.  States she had some nausea earlier upon waking up, no vomiting, it has resolved.  Minimal post-op pain at rest, does hurt when sitting up from laying. Tolerating current diet, ready for it to be advanced.  Has passed some gas this morning.  Denies fever/chills.  Has intermittent cough since surgery.  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 Colonoscopy 05/26/19 showed polypoid, sessile nonulcerated partially obstructing medium-sized mass in the mid sigmoid colon that wasconfirmed as colon cancer. No evidence of metastatic disease on CT chest, abdomen and pelvis. Status post sigmoid colectomy 05/28/19 .  --Oncology and General Surgery following --advance diet per surgery --pain management --antiemetics PRN --bowel regimen  Right leg cellulitis: resolved.Completed course of Keflex.  Insulin-dependent diabetes mellitus with peripheral neuropathy:  --sliding scale Novolog --continue gabapentin --resume Lantus once taking more PO  Hypertension: Continue antihypertensives  Hyponatremia: This is chronic/fluctuating. Improved.  Schizoaffective disorder: Continue psychotropics.  Morbid Obesity - BMI 38.02 kg/m   DVT prophylaxis: SCD's, resume Lovenox tonight   Code Status: Full Code  Family Communication: none at bedside during encounter  Disposition Plan:  Pending clinical improvement, PT eval.  Stability for discharge per surgery.  Will go home vs SNF/rehab.  Consultants:   Gastroenterology  General Surgery  Procedures:   EGD, colonoscopy 05/26/2019  Sigmoid colectomy 05/28/19  Antimicrobials:   Completed Keflex     Objective: Vitals:   05/29/19 1648 05/29/19 1709 05/30/19 0059 05/30/19 0807  BP: 130/87  (!) 142/86 (!) 150/83  Pulse: 92  97 93  Resp:   16 20  Temp: 98.8 F (37.1 C)  97.9 F (36.6 C) 98 F (36.7 C)  TempSrc: Oral  Oral Oral  SpO2: 97% 95% 95% 98%  Weight:      Height:        Intake/Output Summary (Last 24 hours) at 05/30/2019 1158 Last data  filed at 05/30/2019 1114 Gross per 24 hour  Intake 2736.6 ml  Output 270 ml  Net 2466.6 ml   Filed Weights   05/23/19 1318 05/24/19 0215 05/28/19 1035  Weight: 120.2 kg 119.5 kg 120.2 kg     Examination:  General exam: awake, alert, no acute distress, obese HEENT: clear conjunctiva, moist mucus membranes, hearing grossly normal  Respiratory system: clear to auscultation bilaterally, no wheezes, rales or rhonchi, normal respiratory effort. Cardiovascular system: normal S1/S2, RRR, no JVD, murmurs, rubs, gallops, no pedal edema.   Gastrointestinal system: soft, non-distended, post-op tenderness on palpation improved, some bowel sounds heard. Central nervous system: alert and oriented x3. no gross focal neurologic deficits, normal speech Extremities: moves all, no cyanosis, normal tone Skin: dry, intact, normal temperature Psychiatry: normal mood, congruent affect, judgement and insight appear normal    Data Reviewed: I have personally reviewed following labs and imaging studies  CBC: Recent Labs  Lab 05/24/19 0427 05/25/19 0724 05/26/19 0540 05/29/19 0334 05/30/19 0639  WBC 8.7 8.7 10.0 14.8* 11.2*  NEUTROABS 5.4  --   --   --   --   HGB 8.0* 7.9* 8.7* 7.9* 8.3*  HCT 25.6* 26.2* 29.0* 26.2* 27.7*  MCV 77.8* 79.2* 79.0* 79.9* 79.8*  PLT 316 306 399 360 573*   Basic Metabolic Panel: Recent Labs  Lab 05/23/19 1330 05/25/19 0724 05/29/19 0334 05/30/19 0639  NA 129* 136 137 137  K 4.4 4.8 4.3 4.7  CL 95* 105 106 105  CO2 24 25 20* 23  GLUCOSE 161* 129* 115* 141*  BUN 16 13 10 10   CREATININE 1.44* 1.13* 1.17* 0.99  CALCIUM 9.0 9.2 8.6* 9.3  MG  --   --   --  1.7   GFR: Estimated Creatinine Clearance: 90.4 mL/min (by C-G formula based on SCr of 0.99 mg/dL). Liver Function Tests: No results for input(s): AST, ALT, ALKPHOS, BILITOT, PROT, ALBUMIN in the last 168 hours. No results for input(s): LIPASE, AMYLASE in the last 168 hours. No results for input(s): AMMONIA in the last 168 hours. Coagulation Profile: No results for input(s): INR, PROTIME in the last 168 hours. Cardiac Enzymes: No results for input(s): CKTOTAL, CKMB, CKMBINDEX, TROPONINI in the  last 168 hours. BNP (last 3 results) No results for input(s): PROBNP in the last 8760 hours. HbA1C: No results for input(s): HGBA1C in the last 72 hours. CBG: Recent Labs  Lab 05/29/19 0823 05/29/19 1158 05/29/19 1649 05/29/19 2138 05/30/19 0807  GLUCAP 97 160* 162* 146* 139*   Lipid Profile: No results for input(s): CHOL, HDL, LDLCALC, TRIG, CHOLHDL, LDLDIRECT in the last 72 hours. Thyroid Function Tests: No results for input(s): TSH, T4TOTAL, FREET4, T3FREE, THYROIDAB in the last 72 hours. Anemia Panel: No results for input(s): VITAMINB12, FOLATE, FERRITIN, TIBC, IRON, RETICCTPCT in the last 72 hours. Sepsis Labs: Recent Labs  Lab 05/23/19 1330 05/23/19 1644  LATICACIDVEN 1.8 1.6    Recent Results (from the past 240 hour(s))  Respiratory Panel by RT PCR (Flu A&B, Covid) - Nasopharyngeal Swab     Status: None   Collection Time: 05/21/19  3:41 PM   Specimen: Nasopharyngeal Swab  Result Value Ref Range Status   SARS Coronavirus 2 by RT PCR NEGATIVE NEGATIVE Final    Comment: (NOTE) SARS-CoV-2 target nucleic acids are NOT DETECTED. The SARS-CoV-2 RNA is generally detectable in upper respiratoy specimens during the acute phase of infection. The lowest concentration of SARS-CoV-2 viral copies this assay can detect is 131 copies/mL. A negative  result does not preclude SARS-Cov-2 infection and should not be used as the sole basis for treatment or other patient management decisions. A negative result may occur with  improper specimen collection/handling, submission of specimen other than nasopharyngeal swab, presence of viral mutation(s) within the areas targeted by this assay, and inadequate number of viral copies (<131 copies/mL). A negative result must be combined with clinical observations, patient history, and epidemiological information. The expected result is Negative. Fact Sheet for Patients:  PinkCheek.be Fact Sheet for Healthcare  Providers:  GravelBags.it This test is not yet ap proved or cleared by the [place] FDA and  has been authorized for detection and/or diagnosis of SARS-CoV-2 by FDA under an Emergency Use Authorization (EUA). This EUA will remain  in effect (meaning this test can be used) for the duration of the COVID-19 declaration under Section 564(b)(1) of the Act, 21 U.S.C. section 360bbb-3(b)(1), unless the authorization is terminated or revoked sooner.    Influenza A by PCR NEGATIVE NEGATIVE Final   Influenza B by PCR NEGATIVE NEGATIVE Final    Comment: (NOTE) The Xpert Xpress SARS-CoV-2/FLU/RSV assay is intended as an aid in  the diagnosis of influenza from Nasopharyngeal swab specimens and  should not be used as a sole basis for treatment. Nasal washings and  aspirates are unacceptable for Xpert Xpress SARS-CoV-2/FLU/RSV  testing. Fact Sheet for Patients: PinkCheek.be Fact Sheet for Healthcare Providers: GravelBags.it This test is not yet approved or cleared by the [place] FDA and  has been authorized for detection and/or diagnosis of SARS-CoV-2 by  FDA under an Emergency Use Authorization (EUA). This EUA will remain  in effect (meaning this test can be used) for the duration of the  Covid-19 declaration under Section 564(b)(1) of the Act, 21  U.S.C. section 360bbb-3(b)(1), unless the authorization is  terminated or revoked. Performed at [facility], [address] [place], [place] [address]   Urine culture     Status: Abnormal   Collection Time: 05/21/19 11:17 PM   Specimen: Urine, Random  Result Value Ref Range Status   Specimen Description   Final    URINE, RANDOM Performed at [facility], [address], [place], [place] [address]    Special Requests   Final    NONE Performed at [facility], [address], [place], [place] [address]    Culture MULTIPLE SPECIES PRESENT, SUGGEST  RECOLLECTION (A)  Final   Report Status 05/23/2019 FINAL  Final  SARS CORONAVIRUS 2 (TAT 6-24 HRS) Nasopharyngeal Nasopharyngeal Swab     Status: None   Collection Time: 05/23/19 11:15 PM   Specimen: Nasopharyngeal Swab  Result Value Ref Range Status   SARS Coronavirus 2 NEGATIVE NEGATIVE Final    Comment: (NOTE) SARS-CoV-2 target nucleic acids are NOT DETECTED. The SARS-CoV-2 RNA is generally detectable in upper and lower respiratory specimens during the acute phase of infection. Negative results do not preclude SARS-CoV-2 infection, do not rule out co-infections with other pathogens, and should not be used as the sole basis for treatment or other patient management decisions. Negative results must be combined with clinical observations, patient history, and epidemiological information. The expected result is Negative. Fact Sheet for Patients: SugarRoll.be Fact Sheet for Healthcare Providers: https://www.woods-mathews.com/ This test is not yet approved or cleared by the [place] FDA and  has been authorized for detection and/or diagnosis of SARS-CoV-2 by FDA under an Emergency Use Authorization (EUA). This EUA will remain  in effect (meaning this test can be used) for the  duration of the COVID-19 declaration under Section 56 4(b)(1) of the Act, 21 U.S.C. section 360bbb-3(b)(1), unless the authorization is terminated or revoked sooner. Performed at [facility], [place] [address] [place], [place] [address]   GI pathogen panel by PCR, stool     Status: Abnormal   Collection Time: 05/24/19 10:00 PM   Specimen: Stool  Result Value Ref Range Status   Plesiomonas shigelloides NOT DETECTED NOT DETECTED Final   Yersinia enterocolitica NOT DETECTED NOT DETECTED Final   Vibrio NOT DETECTED NOT DETECTED Final   Enteropathogenic E coli NOT DETECTED NOT DETECTED Final   E coli (ETEC) LT/ST NOT DETECTED NOT DETECTED Final   E coli 1791 by  PCR Not applicable NOT DETECTED Final   Cryptosporidium by PCR DETECTED (A) NOT DETECTED Final   Entamoeba histolytica NOT DETECTED NOT DETECTED Final   Adenovirus F 40/41 NOT DETECTED NOT DETECTED Final   Norovirus GI/GII NOT DETECTED NOT DETECTED Final   Sapovirus NOT DETECTED NOT DETECTED Final    Comment: (NOTE) Performed At: [facility] [place], [place] [id number] Rush Farmer MD AX:[id number]    Vibrio cholerae NOT DETECTED NOT DETECTED Final   Campylobacter by PCR NOT DETECTED NOT DETECTED Final   Salmonella by PCR NOT DETECTED NOT DETECTED Final   E coli (STEC) NOT DETECTED NOT DETECTED Final   Enteroaggregative E coli NOT DETECTED NOT DETECTED Final   Shigella by PCR NOT DETECTED NOT DETECTED Final   Cyclospora cayetanensis NOT DETECTED NOT DETECTED Final   Astrovirus NOT DETECTED NOT DETECTED Final   G lamblia by PCR NOT DETECTED NOT DETECTED Final   Rotavirus A by PCR NOT DETECTED NOT DETECTED Final  C Difficile Quick Screen w PCR reflex     Status: None   Collection Time: 05/24/19 10:00 PM   Specimen: STOOL  Result Value Ref Range Status   C Diff antigen NEGATIVE NEGATIVE Final   C Diff toxin NEGATIVE NEGATIVE Final   C Diff interpretation No C. difficile detected.  Final    Comment: Performed at [facility], [address], [place], [place] [address]         Radiology Studies: No results found.      Scheduled Meds: . acetaminophen  1,000 mg Oral Q6H  . ARIPiprazole  10 mg Oral QHS  . atorvastatin  40 mg Oral Daily  . buPROPion  150 mg Oral q morning - 10a  . ferrous sulfate  325 mg Oral QODAY  . gabapentin  300 mg Oral QHS  . insulin aspart  0-15 Units Subcutaneous TID WC  . pantoprazole (PROTONIX) IV  40 mg Intravenous QHS  . vitamin B-12  1,000 mcg Oral Daily   Continuous Infusions: . lactated ringers 50 mL/hr at 05/30/19 0901     LOS: 7 days    Time spent: 25-30 minutes    Ezekiel Slocumb, DO [facility]   If 7PM-7AM, please contact night-coverage [email or web address] 05/30/2019, 11:58 AM

## 2019-05-30 NOTE — Plan of Care (Signed)

## 2019-05-30 NOTE — Progress Notes (Signed)
[facility] Day(s): 7.   Post op day(s): 2 Days Post-Op.   Interval History:  Patient seen and examined no acute events or new complaints overnight.  Patient reports she is feeling god this morning She notes incisional soreness but this is being managed No fever, chills, nausea, or emesis Hgb improved to 8.3; no signs of bleeding Leukocytosis improved; likely reactive; afebrile Worked with PT; recommending SNF On CLD; tolerating well; + flatus   Vital signs in last 24 hours: [min-max] current  Temp:  [97.9 F (36.6 C)-98.8 F (37.1 C)] 97.9 F (36.6 C) (01/28 0059) Pulse Rate:  [92-103] 97 (01/28 0059) Resp:  [16-18] 16 (01/28 0059) BP: (123-142)/(66-87) 142/86 (01/28 0059) SpO2:  [95 %-97 %] 95 % (01/28 0059)     Height: 5\' 10"  (177.8 cm) Weight: 120.2 kg BMI (Calculated): 38.02   Intake/Output last 2 shifts:  01/27 0701 - 01/28 0700 In: 3516.6 [P.O.:2340; I.V.:1076.6; IV Piggyback:100] Out: 220 [Drains:220]   Physical Exam:  Constitutional: alert, cooperative and no distress  Respiratory: breathing non-labored at rest  Cardiovascular: regular rate and sinus rhythm  Gastrointestinal: soft, incisional soreness, and non-distended, no rebound/guarding, Surgical drain in RLQ with serosanguinous output Integumentary: Laparoscopic incisions are CDI with dermabond, no erythema or drainage   Labs:  CBC Latest Ref Rng & Units 05/29/2019 05/26/2019 05/25/2019  WBC 4.0 - 10.5 K/uL 14.8(H) 10.0 8.7  Hemoglobin 12.0 - 15.0 g/dL 7.9(L) 8.7(L) 7.9(L)  Hematocrit 36.0 - 46.0 % 26.2(L) 29.0(L) 26.2(L)  Platelets 150 - 400 K/uL 360 399 306   CMP Latest Ref Rng & Units 05/29/2019 05/25/2019 05/23/2019  Glucose 70 - 99 mg/dL 115(H) 129(H) 161(H)  BUN 6 - 20 mg/dL 10 13 16   Creatinine 0.44 - 1.00 mg/dL 1.17(H) 1.13(H) 1.44(H)  Sodium 135 - 145 mmol/L 137 136 129(L)  Potassium 3.5 - 5.1 mmol/L 4.3 4.8 4.4  Chloride 98 - 111 mmol/L 106 105  95(L)  CO2 22 - 32 mmol/L 20(L) 25 24  Calcium 8.9 - 10.3 mg/dL 8.6(L) 9.2 9.0  Total Protein 6.5 - 8.1 g/dL - - -  Total Bilirubin 0.3 - 1.2 mg/dL - - -  Alkaline Phos 38 - 126 U/L - - -  AST 15 - 41 U/L - - -  ALT 0 - 44 U/L - - -     Imaging studies: No new pertinent imaging studies   Assessment/Plan:  54 y.o. female with signs of bowel function return 2 Days Post-Op s/p robotic assisted laparoscopic sigmoid colectomy for sigmoid colon mass concerning for malignancy   - Advance to full liquid diet             - Wean IVF              - Pain control prn (minimize narcotics if feasible); antiemetics prn             - Monitor abdominal examination; on-going bowel function             - Continue surgical drain; monitor and record output daily             - Monitor Hgb; do not suspect active bleeding; transfuse prn             - Monitor leukocytosis; most likely reactive; check in AM             - Mobilize early; PT following; recommending SNF             -  further management per primary service              - Resume DVT prophylaxis this evening  All of the above findings and recommendations were discussed with the patient, and the medical team, and all of patient's questions were answered to her expressed satisfaction.  -- Edison Simon, PA-C [facility] 05/30/2019, 7:16 AM [phone] M-F: 7am - 4pm

## 2019-05-31 ENCOUNTER — Inpatient Hospital Stay: Payer: Medicare Other

## 2019-05-31 LAB — GLUCOSE, CAPILLARY
Glucose-Capillary: 150 mg/dL — ABNORMAL HIGH (ref 70–99)
Glucose-Capillary: 151 mg/dL — ABNORMAL HIGH (ref 70–99)
Glucose-Capillary: 186 mg/dL — ABNORMAL HIGH (ref 70–99)
Glucose-Capillary: 184 mg/dL — ABNORMAL HIGH (ref 70–99)
Glucose-Capillary: 204 mg/dL — ABNORMAL HIGH (ref 70–99)

## 2019-05-31 LAB — CBC
HCT: 26.2 % — ABNORMAL LOW (ref 36.0–46.0)
Hemoglobin: 7.9 g/dL — ABNORMAL LOW (ref 12.0–15.0)
MCH: 24.2 pg — ABNORMAL LOW (ref 26.0–34.0)
MCHC: 30.2 g/dL (ref 30.0–36.0)
MCV: 80.1 fL (ref 80.0–100.0)
Platelets: 395 10*3/uL (ref 150–400)
RBC: 3.27 MIL/uL — ABNORMAL LOW (ref 3.87–5.11)
RDW: 16.4 % — ABNORMAL HIGH (ref 11.5–15.5)
WBC: 10.7 10*3/uL — ABNORMAL HIGH (ref 4.0–10.5)
nRBC: 0 % (ref 0.0–0.2)

## 2019-05-31 LAB — BASIC METABOLIC PANEL
Anion gap: 7 (ref 5–15)
BUN: 10 mg/dL (ref 6–20)
CO2: 24 mmol/L (ref 22–32)
Calcium: 9.1 mg/dL (ref 8.9–10.3)
Chloride: 104 mmol/L (ref 98–111)
Creatinine, Ser: 0.91 mg/dL (ref 0.44–1.00)
GFR calc Af Amer: >60 mL/min (ref >60)
GFR calc non Af Amer: >60 mL/min (ref >60)
Glucose, Bld: 188 mg/dL — ABNORMAL HIGH (ref 70–99)
Potassium: 4.7 mmol/L (ref 3.5–5.1)
Sodium: 135 mmol/L (ref 135–145)

## 2019-05-31 LAB — MAGNESIUM: Magnesium: 1.6 mg/dL — ABNORMAL LOW (ref 1.7–2.4)

## 2019-05-31 MED ORDER — MAGNESIUM SULFATE 2 GM/50ML IV SOLN
2.0000 g | Freq: Once | INTRAVENOUS | Status: AC
  Administered 2019-05-31: 2 g via INTRAVENOUS
  Filled 2019-05-31: qty 50

## 2019-05-31 MED ORDER — INSULIN GLARGINE 100 UNIT/ML ~~LOC~~ SOLN
60.0000 [IU] | Freq: Every day | SUBCUTANEOUS | Status: DC
  Administered 2019-06-01 – 2019-06-04 (×4): 60 [IU] via SUBCUTANEOUS
  Filled 2019-05-31 (×5): qty 0.6

## 2019-05-31 MED ORDER — LISINOPRIL 20 MG PO TABS
20.0000 mg | ORAL_TABLET | Freq: Every day | ORAL | Status: DC
  Administered 2019-05-31 – 2019-06-02 (×3): 20 mg via ORAL
  Filled 2019-05-31 (×3): qty 1

## 2019-05-31 MED ORDER — HYDROCHLOROTHIAZIDE 12.5 MG PO CAPS
12.5000 mg | ORAL_CAPSULE | Freq: Every day | ORAL | Status: DC
  Administered 2019-05-31 – 2019-06-03 (×4): 12.5 mg via ORAL
  Filled 2019-05-31 (×4): qty 1

## 2019-05-31 MED ORDER — AMLODIPINE BESYLATE 5 MG PO TABS
5.0000 mg | ORAL_TABLET | Freq: Every day | ORAL | Status: DC
  Administered 2019-05-31: 5 mg via ORAL
  Filled 2019-05-31: qty 1

## 2019-05-31 NOTE — Discharge Instructions (Signed)
PODIATRY DISCHARGE INSTRUCTIONS: Remain nonweightbearing to the left lower extremity all times.  The x-ray that was taken during her hospitalization still showed fragmentation across the area of her Charcot foot deformity.  This area is still not completely healed.  At your next visit we will discuss treatment options at this point and will take a repeat x-ray image.  Still try to stay in the boot to stabilize your foot is much as possible.  Continue using compression stockings to both lower extremities to improve swelling further.  If the bone still continues to be slow to heal then we could potentially try to order a bone stimulator in the future.  In the future once the bone is healed you will need a Crow walker to prevent any ulcerations from occurring on the left side from your Charcot foot deformity.  Also recommend diabetic shoes with inserts in the future.  If any ulcerations or openings in your skin do occur or you have any concerns at all please schedule an appointment for sooner than 1 month.

## 2019-05-31 NOTE — Consult Note (Signed)
PODIATRY / FOOT AND ANKLE SURGERY CONSULTATION NOTE  Requesting Physician: Nicole Kindred, DO  Reason for consult: Right foot cellulitis, left Charcot foot  Chief Complaint: Left foot Charcot   HPI: [name] is a 54 y.o. female who presents with left foot subacute Charcot foot deformity.  Patient called the office prior to current admission due to family concern for potential sepsis as the patient appeared to be acting very abnormally at home.  Patient was staying with her daughter and has been very difficult to take care of overall.  Patient was brought to the emergency room for examination where it was noted that her hemoglobin had dropped around 8% and a guaiac test was positive.  Patient was admitted for further work-up.  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  Patient is currently being treated for this issue.  Podiatry team was consulted for further assessment of bilateral lower extremities.  Patient states that she has tried to remain nonweightbearing to left lower extremity as instructed after last visit.  Patient has noticed decrease overall and swelling to both legs.  She states that she has been wearing compression stockings but does not have them on today.  Patient denies nausea, vomiting, fever, chills.  Patient states that she would like to go to a facility for rehab after admission.  Patient is well-known to me and has a history of noncompliance.  Patient's hemoglobin A1c has been extremely elevated in the past and do not recommend surgical intervention at this time once again due to history of noncompliance and medical status.  PMHx:  Past Medical History:  Diagnosis Date  . Depression   . Diabetes mellitus without complication ([place])   . Hyperlipidemia   . Hypertension   . Neuromuscular disorder ([place])   . Schizo affective schizophrenia ([place])    abilify and pazil    Surgical Hx:  Past Surgical History:  Procedure Laterality  Date  . CHOLECYSTECTOMY    . COLONOSCOPY WITH PROPOFOL N/A 05/26/2019   Procedure: COLONOSCOPY WITH PROPOFOL;  Surgeon: Toledo, Benay Pike, MD;  Location: [facility];  Service: Gastroenterology;  Laterality: N/A;  . ESOPHAGOGASTRODUODENOSCOPY (EGD) WITH PROPOFOL N/A 05/26/2019   Procedure: ESOPHAGOGASTRODUODENOSCOPY (EGD) WITH PROPOFOL;  Surgeon: Toledo, Benay Pike, MD;  Location: [facility];  Service: Gastroenterology;  Laterality: N/A;  . IRRIGATION AND DEBRIDEMENT FOOT Right 02/26/2016   Procedure: RIGHT 2ND TOE DEBRIDEMENT;  Surgeon: Samara Deist, DPM;  Location: [facility];  Service: Podiatry;  Laterality: Right;  . IRRIGATION AND DEBRIDEMENT FOOT Left 12/13/2018   Procedure: IRRIGATION AND DEBRIDEMENT FOOT;  Surgeon: Caroline More, DPM;  Location: [facility];  Service: Podiatry;  Laterality: Left;    FHx:  Family History  Problem Relation Age of Onset  . Breast cancer Cousin        maternal cousin  . Hypertension Mother   . Hyperthyroidism Mother   . Diabetes Maternal Grandmother   . Hypertension Maternal Grandmother   . Cancer Maternal Grandmother        unknown type  . Diabetes Maternal Grandfather   . Hypertension Maternal Grandfather   . Diabetes Paternal Grandmother   . Hypertension Paternal Grandmother   . Diabetes Paternal Grandfather   . Hypertension Paternal Grandfather     Social History:  reports that she quit smoking about 10 years ago. Her smoking use included cigarettes. She has a 90.00 pack-year smoking history. She has quit using smokeless tobacco. She reports that she does not  drink alcohol or use drugs.  Allergies: No Known Allergies  Review of Systems: General ROS: negative Psychological ROS: negative Respiratory ROS: no cough, shortness of breath, or wheezing Cardiovascular ROS: no chest pain or dyspnea on exertion Gastrointestinal ROS: no abdominal pain, change in bowel habits, or black or bloody stools Musculoskeletal ROS: positive for - joint  swelling and swelling in foot - bilateral and leg - bilateral Neurological ROS: positive for - numbness/tingling Dermatological ROS: negative  Medications Prior to Admission  Medication Sig Dispense Refill  . ARIPiprazole (ABILIFY) 10 MG tablet Take 10 mg by mouth at bedtime.     [name] ascorbic acid (VITAMIN C) 500 MG tablet Take 500 mg by mouth daily.    [name] aspirin EC 81 MG tablet Take 81 mg by mouth 3 (three) times a week.    [name] atorvastatin (LIPITOR) 40 MG tablet Take 1 tablet (40 mg total) by mouth daily. 90 tablet 3  . buPROPion (WELLBUTRIN XL) 150 MG 24 hr tablet Take 150 mg by mouth every morning.     . cephALEXin (KEFLEX) 500 MG capsule Take 1 capsule (500 mg total) by mouth 3 (three) times daily. 21 capsule 0  . HUMALOG KWIKPEN 100 UNIT/ML KwikPen Inject 0.05 mLs (5 Units total) into the skin 3 (three) times daily with meals. + Sliding scale (Patient taking differently: Inject 12 Units into the skin See admin instructions. Inject 12 units into the skin 3 times with meals, plus 2 units for every 50 points above 150) 15 mL 5  . hydrochlorothiazide (HYDRODIURIL) 12.5 MG tablet Take 1 tablet (12.5 mg total) by mouth daily. 30 tablet 2  . Insulin Glargine (LANTUS SOLOSTAR) 100 UNIT/ML Solostar Pen Inject 40 Units into the skin daily. Adjust dose as advised (Patient taking differently: Inject 60 Units into the skin daily before breakfast. ) 15 mL 1  . lisinopril (ZESTRIL) 20 MG tablet Take 1 tablet (20 mg total) by mouth daily. 90 tablet 3  . metFORMIN (GLUCOPHAGE) 1000 MG tablet Take 1 tablet (1,000 mg total) by mouth 2 (two) times daily with a meal. 180 tablet 3  . ondansetron (ZOFRAN) 4 MG tablet Take 1 tablet (4 mg total) by mouth every 6 (six) hours. (Patient taking differently: Take 4 mg by mouth every 8 (eight) hours as needed for nausea or vomiting. ) 12 tablet 0  . ELIQUIS 2.5 MG TABS tablet Take 2.5 mg by mouth 2 (two) times daily.    . Lancets (ONETOUCH ULTRASOFT) lancets Use to check  blood sugar up to 3 x daily 100 each 12  . ONETOUCH VERIO test strip Check blood sugar up to 5 x daily as needed 450 each 3    Physical Exam: General: Alert and oriented.  No apparent distress.  Vascular: DP/PT pulses palpable bilateral capillary fill time intact to digits bilateral.  No hair growth noted to digits bilateral.  Mild pitting edema noted to bilateral lower extremities left slightly worse than right.  Overall edema appears to be much improved since previous visit.  Neuro: Light touch sensation absent to bilateral lower extremities.  Derm: No open ulcerations at this time.  Previous ulcerations at the left medial heel and right medial ankle appear to be closed at this time.  No acute signs of infection present bilateral lower extremities.  MSK: Notable deformity noted to the left foot with increased prominence medially at the navicular level.  No pain on palpation of bilateral lower extremities.  Results for orders placed or performed during  the hospital encounter of 05/23/19 (from the past 48 hour(s))  Glucose, capillary     Status: Abnormal   Collection Time: 05/29/19  4:49 PM  Result Value Ref Range   Glucose-Capillary 162 (H) 70 - 99 mg/dL  Glucose, capillary     Status: Abnormal   Collection Time: 05/29/19  9:38 PM  Result Value Ref Range   Glucose-Capillary 146 (H) 70 - 99 mg/dL   Comment 1 Notify RN   CBC     Status: Abnormal   Collection Time: 05/30/19  6:39 AM  Result Value Ref Range   WBC 11.2 (H) 4.0 - 10.5 K/uL   RBC 3.47 (L) 3.87 - 5.11 MIL/uL   Hemoglobin 8.3 (L) 12.0 - 15.0 g/dL   HCT 27.7 (L) 36.0 - 46.0 %   MCV 79.8 (L) 80.0 - 100.0 fL   MCH 23.9 (L) 26.0 - 34.0 pg   MCHC 30.0 30.0 - 36.0 g/dL   RDW 16.5 (H) 11.5 - 15.5 %   Platelets 401 (H) 150 - 400 K/uL   nRBC 0.0 0.0 - 0.2 %    Comment: Performed at [facility], [address] [place], [place] [address]  Basic metabolic panel     Status: Abnormal   Collection Time: 05/30/19  6:39 AM   Result Value Ref Range   Sodium 137 135 - 145 mmol/L   Potassium 4.7 3.5 - 5.1 mmol/L   Chloride 105 98 - 111 mmol/L   CO2 23 22 - 32 mmol/L   Glucose, Bld 141 (H) 70 - 99 mg/dL   BUN 10 6 - 20 mg/dL   Creatinine, Ser 0.99 0.44 - 1.00 mg/dL   Calcium 9.3 8.9 - 10.3 mg/dL   GFR calc non Af Amer >60 >60 mL/min   GFR calc Af Amer >60 >60 mL/min   Anion gap 9 5 - 15    Comment: Performed at [facility], [address] [place], [place] [address]  Magnesium     Status: None   Collection Time: 05/30/19  6:39 AM  Result Value Ref Range   Magnesium 1.7 1.7 - 2.4 mg/dL    Comment: Performed at [facility], [address] [place], [place] [address]  Glucose, capillary     Status: Abnormal   Collection Time: 05/30/19  8:07 AM  Result Value Ref Range   Glucose-Capillary 139 (H) 70 - 99 mg/dL   Comment 1 Notify RN    Comment 2 Document in Chart   Glucose, capillary     Status: Abnormal   Collection Time: 05/30/19 12:34 PM  Result Value Ref Range   Glucose-Capillary 206 (H) 70 - 99 mg/dL   Comment 1 Notify RN    Comment 2 Document in Chart   Glucose, capillary     Status: Abnormal   Collection Time: 05/30/19  4:28 PM  Result Value Ref Range   Glucose-Capillary 169 (H) 70 - 99 mg/dL   Comment 1 Notify RN    Comment 2 Document in Chart   Glucose, capillary     Status: Abnormal   Collection Time: 05/30/19  8:34 PM  Result Value Ref Range   Glucose-Capillary 142 (H) 70 - 99 mg/dL  CBC     Status: Abnormal   Collection Time: 05/31/19  4:48 AM  Result Value Ref Range   WBC 10.7 (H) 4.0 - 10.5 K/uL   RBC 3.27 (L) 3.87 - 5.11 MIL/uL   Hemoglobin 7.9 (L) 12.0 - 15.0 g/dL  HCT 26.2 (L) 36.0 - 46.0 %   MCV 80.1 80.0 - 100.0 fL   MCH 24.2 (L) 26.0 - 34.0 pg   MCHC 30.2 30.0 - 36.0 g/dL   RDW 16.4 (H) 11.5 - 15.5 %   Platelets 395 150 - 400 K/uL   nRBC 0.0 0.0 - 0.2 %    Comment: Performed at [facility], [address] [place], [place] [address]   Basic metabolic panel     Status: Abnormal   Collection Time: 05/31/19  4:48 AM  Result Value Ref Range   Sodium 135 135 - 145 mmol/L   Potassium 4.7 3.5 - 5.1 mmol/L   Chloride 104 98 - 111 mmol/L   CO2 24 22 - 32 mmol/L   Glucose, Bld 188 (H) 70 - 99 mg/dL   BUN 10 6 - 20 mg/dL   Creatinine, Ser 0.91 0.44 - 1.00 mg/dL   Calcium 9.1 8.9 - 10.3 mg/dL   GFR calc non Af Amer >60 >60 mL/min   GFR calc Af Amer >60 >60 mL/min   Anion gap 7 5 - 15    Comment: Performed at [facility], [address], [place], [place] [address]  Magnesium     Status: Abnormal   Collection Time: 05/31/19  4:48 AM  Result Value Ref Range   Magnesium 1.6 (L) 1.7 - 2.4 mg/dL    Comment: Performed at [facility], [address] [place], [place] [address]  Glucose, capillary     Status: Abnormal   Collection Time: 05/31/19  7:59 AM  Result Value Ref Range   Glucose-Capillary 150 (H) 70 - 99 mg/dL   Comment 1 Notify RN   Glucose, capillary     Status: Abnormal   Collection Time: 05/31/19  8:34 AM  Result Value Ref Range   Glucose-Capillary 151 (H) 70 - 99 mg/dL   Comment 1 Notify RN    Comment 2 Document in Chart   Glucose, capillary     Status: Abnormal   Collection Time: 05/31/19 11:47 AM  Result Value Ref Range   Glucose-Capillary 186 (H) 70 - 99 mg/dL   Comment 1 Notify RN    DG Foot Complete Left  Result Date: 05/31/2019 CLINICAL DATA:  Diabetes, Charcot arthropathy EXAM: LEFT FOOT - COMPLETE 3+ VIEW COMPARISON:  04/27/2019, 12/13/2018 FINDINGS: Redemonstration of neuropathic changes of the midfoot with malalignment including fragmentation and medial dislocation of the navicular, dorsal subluxation of the cuneiforms relative to the hindfoot. There is a similar degree of fragmentation within the midfoot. Tibiotalar and tarsometatarsal joint alignment is maintained. There is diffuse heterogeneity of the osseous structures likely reflecting demineralization. No new fractures are  seen. There is diffuse soft tissue prominence and vascular calcifications. IMPRESSION: Findings of Charcot arthropathy of the left midfoot without significant interval progression compared to previous study 04/27/2019. Electronically Signed   By: Davina Poke D.O.   On: 05/31/2019 10:40    Blood pressure (!) 166/97, pulse 87, temperature 97.6 F (36.4 C), temperature source Oral, resp. rate 17, height 5\' 10"  (1.778 m), weight 120.2 kg, SpO2 99 %.  Assessment 1. Left foot Charcot neuroarthropathy 2. Diabetes type 2 with neuropathy 3. History of noncompliance  Plan -Patient seen and examined. -It appears as though the swelling and edema to bilateral lower extremities has been greatly reduced overall.  Patient still has mild amount of erythema present to the left foot but no open wounds noted to bilateral lower extremities. -X-rays reviewed and discussed  with patient in detail.  Subacute Charcot foot presents with fragmentation along the midfoot across talonavicular and calcaneocuboid joints as well as the midfoot joints.  The navicular still appears to be subluxed and dislocated medially and the talar head appears to be articulating now with the cuneiforms.  The talar head appears to be in a plantarflexed position overall but does not appear to be overly prominent medially.  Some resorption of bone is present since previous interval x-ray was taken. -Still would recommend nonweightbearing at all times the left lower extremity in a protective boot.  Patient will have to remain nonweightbearing until more coalescence is seen of the fracture fragments and bony fusion is obtained. -This could potentially be fixed in the future with the Charcot reconstruction but due to the patient's mentation, noncompliance, and poorly controlled diabetes would recommend proceeding conservatively at this time.  Patient will likely need a Crow boot in the future as well as diabetic shoe with inserts of the right side.   Discussed this with patient in detail.  Patient understands. -Patient can follow-up in clinic in 1 month for further evaluation or if any problems arise.  Instructions placed in chart.  Podiatry team to sign off at this time.  Call with any questions or concerns.  Caroline More, DPM 05/31/2019, 1:19 PM

## 2019-05-31 NOTE — Progress Notes (Signed)
PROGRESS NOTE    [name]  EVO:[id number] DOB: [birth year] DOA: 05/23/2019  PCP: Gabriela Hauser, DO    LOS - 8   Brief Narrative:  [name] an 54 y.o.femalewith medical history significant ofdiabetes mellitus type 2, hypertension, hyperlipidemia, Charcot foot, schizoaffective disorder,presented to the ED on 05/22/19,accompanied by daughter because of concern for possible sepsisandright lower leg cellulitis. Patient was diagnosed with cellulitis few dayspriorandhad beenstarted on Keflex. Patient had alsohad recent EDvisits for both cellulitis as well as concern about patient's ability to care for self. In the ED, duplex ultrasound negative for DVT, left lower extremity cellulitis confirmed. Hemoglobin 8 from 10 previously, with positive heme occult stool. Admitted to hospitalist service with GI consulted. EGD and colonoscopy were performed on 05/26/19. EGD showed irregular z-line, few gastric polyps. Colonoscopy showed a likely malignant obstructing tumor in the mid-sigmoid colon. General surgery consulted and patient underwent sigmoid colectomy on 05/28/19.  Subjective 1/29: Patient seen this AM.  No acute events reported. She reports some tenderness at incision site, getting little better each day.  Denies nausea/vomiting.  Says looking forward to trying soft diet for lunch.  Tolerated full liquids.  No N/V.  Says she had a BM yesterday.  Assessment & Plan:   Active Problems:   DM (diabetes mellitus), type 2, 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HCC)   Polyneuropathy   Essential hypertension   Schizoaffective disorder (HCC)   Hammer toe of second toe of right foot   Hyperlipidemia associated with type 2 diabetes mellitus ([place])   Neuropathic foot ulcer, left, with fat layer exposed ([place])   Cellulitis in diabetic foot (HCC)   Hyponatremia   GI bleed   Cellulitis   Colon cancer ([place])   Adenocarcinoma of sigmoid colon  ([place])   Adenocarcinoma of Sigmoid Colon: stage to be determined, pathology pending. Colonoscopy1/24/21showed polypoid, sessile nonulcerated partially obstructing medium-sized mass in the mid sigmoid colon that wasconfirmed as colon cancer. No evidence of metastatic disease on CT chest, abdomen and pelvis.Status post sigmoid colectomy 05/28/19. --Oncology and General Surgery following --advance diet per surgery --pain management --antiemetics PRN --bowel regimen --monitor JP drain output  Right leg cellulitis:resolved.Completed course ofKeflex.  Insulin-dependent diabetes mellitus with peripheral neuropathy: --sliding scale Novolog --continue gabapentin --resume Lantus once taking more PO  Hypertension: Continue antihypertensives  Hyponatremia: This is chronic/fluctuating. Improved.  Schizoaffective disorder: Continue psychotropics.  Morbid Obesity - BMI 38.02 kg/m   DVT prophylaxis:SCD's, resume Lovenox tonight Code Status: Full Code Family Communication:none at bedside during encounter Disposition Plan:Stable for discharge from surgery's standpoint.  Discharge to SNF/rehab pending bed available.  Consultants:  Gastroenterology  General Surgery  Procedures:  EGD, colonoscopy 05/26/2019  Sigmoid colectomy 05/28/19  Antimicrobials:  Completed Keflex   Objective: Vitals:   05/30/19 0807 05/30/19 1618 05/30/19 2334 05/31/19 0801  BP: (!) 150/83 (!) 142/85 (!) 159/80 (!) 166/97  Pulse: 93 89 91 87  Resp: 20 18 18 17   Temp: 98 F (36.7 C) 97.7 F (36.5 C) (!) 97.4 F (36.3 C) 97.6 F (36.4 C)  TempSrc: Oral Oral Oral Oral  SpO2: 98% 100% 99% 99%  Weight:      Height:        Intake/Output Summary (Last 24 hours) at 05/31/2019 1234 Last data filed at 05/31/2019 2993 Gross per 24 hour  Intake 994.1 ml  Output 1312 ml  Net -317.9 ml   Filed Weights   05/23/19 1318 05/24/19 0215 05/28/19 1035  Weight: 120.2 kg 119.5 kg  120.2 kg  Examination:  General exam: awake, alert, no acute distress, obese HEENT: clear conjunctiva, anicteric sclera, moist mucus membranes, hearing grossly normal  Respiratory system: clear to auscultation bilaterally, no wheezes, rales or rhonchi, normal respiratory effort. Cardiovascular system: normal S1/S2, RRR, no JVD, murmurs, rubs, gallops, no pedal edema.   Gastrointestinal system: soft, tenderness limited to incision area is improved, non-distended abdomen, JP drain in place with serosanginous fluid, normal bowel sounds. Central nervous system: alert and oriented x4. no gross focal neurologic deficits, normal speech Extremities: left LE edema, trace edema on right LE, no cyanosis, normal tone Skin: dry, intact, normal temperature Psychiatry: normal mood, congruent affect, judgement and insight appear normal    Data Reviewed: I have personally reviewed following labs and imaging studies  CBC: Recent Labs  Lab 05/25/19 0724 05/26/19 0540 05/29/19 0334 05/30/19 0639 05/31/19 0448  WBC 8.7 10.0 14.8* 11.2* 10.7*  HGB 7.9* 8.7* 7.9* 8.3* 7.9*  HCT 26.2* 29.0* 26.2* 27.7* 26.2*  MCV 79.2* 79.0* 79.9* 79.8* 80.1  PLT 306 399 360 401* 540   Basic Metabolic Panel: Recent Labs  Lab 05/25/19 0724 05/29/19 0334 05/30/19 0639 05/31/19 0448  NA 136 137 137 135  K 4.8 4.3 4.7 4.7  CL 105 106 105 104  CO2 25 20* 23 24  GLUCOSE 129* 115* 141* 188*  BUN 13 10 10 10   CREATININE 1.13* 1.17* 0.99 0.91  CALCIUM 9.2 8.6* 9.3 9.1  MG  --   --  1.7 1.6*   GFR: Estimated Creatinine Clearance: 98.4 mL/min (by C-G formula based on SCr of 0.91 mg/dL). Liver Function Tests: No results for input(s): AST, ALT, ALKPHOS, BILITOT, PROT, ALBUMIN in the last 168 hours. No results for input(s): LIPASE, AMYLASE in the last 168 hours. No results for input(s): AMMONIA in the last 168 hours. Coagulation Profile: No results for input(s): INR, PROTIME in the last 168 hours. Cardiac  Enzymes: No results for input(s): CKTOTAL, CKMB, CKMBINDEX, TROPONINI in the last 168 hours. BNP (last 3 results) No results for input(s): PROBNP in the last 8760 hours. HbA1C: No results for input(s): HGBA1C in the last 72 hours. CBG: Recent Labs  Lab 05/30/19 1628 05/30/19 2034 05/31/19 0759 05/31/19 0834 05/31/19 1147  GLUCAP 169* 142* 150* 151* 186*   Lipid Profile: No results for input(s): CHOL, HDL, LDLCALC, TRIG, CHOLHDL, LDLDIRECT in the last 72 hours. Thyroid Function Tests: No results for input(s): TSH, T4TOTAL, FREET4, T3FREE, THYROIDAB in the last 72 hours. Anemia Panel: No results for input(s): VITAMINB12, FOLATE, FERRITIN, TIBC, IRON, RETICCTPCT in the last 72 hours. Sepsis Labs: No results for input(s): PROCALCITON, LATICACIDVEN in the last 168 hours.  Recent Results (from the past 240 hour(s))  Respiratory Panel by RT PCR (Flu A&B, Covid) - Nasopharyngeal Swab     Status: None   Collection Time: 05/21/19  3:41 PM   Specimen: Nasopharyngeal Swab  Result Value Ref Range Status   SARS Coronavirus 2 by RT PCR NEGATIVE NEGATIVE Final    Comment: (NOTE) SARS-CoV-2 target nucleic acids are NOT DETECTED. The SARS-CoV-2 RNA is generally detectable in upper respiratoy specimens during the acute phase of infection. The lowest concentration of SARS-CoV-2 viral copies this assay can detect is 131 copies/mL. A negative result does not preclude SARS-Cov-2 infection and should not be used as the sole basis for treatment or other patient management decisions. A negative result may occur with  improper specimen collection/handling, submission of specimen other than nasopharyngeal swab, presence of viral mutation(s) within the areas targeted  by this assay, and inadequate number of viral copies (<131 copies/mL). A negative result must be combined with clinical observations, patient history, and epidemiological information. The expected result is Negative. Fact Sheet for  Patients:  PinkCheek.be Fact Sheet for Healthcare Providers:  GravelBags.it This test is not yet ap proved or cleared by the [place] FDA and  has been authorized for detection and/or diagnosis of SARS-CoV-2 by FDA under an Emergency Use Authorization (EUA). This EUA will remain  in effect (meaning this test can be used) for the duration of the COVID-19 declaration under Section 564(b)(1) of the Act, 21 U.S.C. section 360bbb-3(b)(1), unless the authorization is terminated or revoked sooner.    Influenza A by PCR NEGATIVE NEGATIVE Final   Influenza B by PCR NEGATIVE NEGATIVE Final    Comment: (NOTE) The Xpert Xpress SARS-CoV-2/FLU/RSV assay is intended as an aid in  the diagnosis of influenza from Nasopharyngeal swab specimens and  should not be used as a sole basis for treatment. Nasal washings and  aspirates are unacceptable for Xpert Xpress SARS-CoV-2/FLU/RSV  testing. Fact Sheet for Patients: PinkCheek.be Fact Sheet for Healthcare Providers: GravelBags.it This test is not yet approved or cleared by the [place] FDA and  has been authorized for detection and/or diagnosis of SARS-CoV-2 by  FDA under an Emergency Use Authorization (EUA). This EUA will remain  in effect (meaning this test can be used) for the duration of the  Covid-19 declaration under Section 564(b)(1) of the Act, 21  U.S.C. section 360bbb-3(b)(1), unless the authorization is  terminated or revoked. Performed at [facility], [address] [place], [place] [address]   Urine culture     Status: Abnormal   Collection Time: 05/21/19 11:17 PM   Specimen: Urine, Random  Result Value Ref Range Status   Specimen Description   Final    URINE, RANDOM Performed at [facility], [address], [place], [place] [address]    Special Requests   Final    NONE Performed at [facility], [address] [place], [place] [address]    Culture MULTIPLE SPECIES PRESENT, SUGGEST RECOLLECTION (A)  Final   Report Status 05/23/2019 FINAL  Final  SARS CORONAVIRUS 2 (TAT 6-24 HRS) Nasopharyngeal Nasopharyngeal Swab     Status: None   Collection Time: 05/23/19 11:15 PM   Specimen: Nasopharyngeal Swab  Result Value Ref Range Status   SARS Coronavirus 2 NEGATIVE NEGATIVE Final    Comment: (NOTE) SARS-CoV-2 target nucleic acids are NOT DETECTED. The SARS-CoV-2 RNA is generally detectable in upper and lower respiratory specimens during the acute phase of infection. Negative results do not preclude SARS-CoV-2 infection, do not rule out co-infections with other pathogens, and should not be used as the sole basis for treatment or other patient management decisions. Negative results must be combined with clinical observations, patient history, and epidemiological information. The expected result is Negative. Fact Sheet for Patients: SugarRoll.be Fact Sheet for Healthcare Providers: https://www.woods-mathews.com/ This test is not yet approved or cleared by the [place] FDA and  has been authorized for detection and/or diagnosis of SARS-CoV-2 by FDA under an Emergency Use Authorization (EUA). This EUA will remain  in effect (meaning this test can be used) for the duration of the COVID-19 declaration under Section 56 4(b)(1) of the Act, 21 U.S.C. section 360bbb-3(b)(1), unless the authorization is terminated or revoked sooner. Performed at [facility], [place] [address], [place], [place] [address]   GI pathogen panel by PCR, stool  Status: Abnormal   Collection Time: 05/24/19 10:00 PM   Specimen: Stool  Result Value Ref Range Status   Plesiomonas shigelloides NOT DETECTED NOT DETECTED Final   Yersinia enterocolitica NOT DETECTED NOT DETECTED Final   Vibrio NOT DETECTED NOT DETECTED Final   Enteropathogenic E coli NOT DETECTED NOT DETECTED  Final   E coli (ETEC) LT/ST NOT DETECTED NOT DETECTED Final   E coli 9767 by PCR Not applicable NOT DETECTED Final   Cryptosporidium by PCR DETECTED (A) NOT DETECTED Final   Entamoeba histolytica NOT DETECTED NOT DETECTED Final   Adenovirus F 40/41 NOT DETECTED NOT DETECTED Final   Norovirus GI/GII NOT DETECTED NOT DETECTED Final   Sapovirus NOT DETECTED NOT DETECTED Final    Comment: (NOTE) Performed At: [facility] [place], [place] [id number] Rush Farmer MD IO:[id number]    Vibrio cholerae NOT DETECTED NOT DETECTED Final   Campylobacter by PCR NOT DETECTED NOT DETECTED Final   Salmonella by PCR NOT DETECTED NOT DETECTED Final   E coli (STEC) NOT DETECTED NOT DETECTED Final   Enteroaggregative E coli NOT DETECTED NOT DETECTED Final   Shigella by PCR NOT DETECTED NOT DETECTED Final   Cyclospora c[place]etanensis NOT DETECTED NOT DETECTED Final   Astrovirus NOT DETECTED NOT DETECTED Final   G lamblia by PCR NOT DETECTED NOT DETECTED Final   Rotavirus A by PCR NOT DETECTED NOT DETECTED Final  C Difficile Quick Screen w PCR reflex     Status: None   Collection Time: 05/24/19 10:00 PM   Specimen: STOOL  Result Value Ref Range Status   C Diff antigen NEGATIVE NEGATIVE Final   C Diff toxin NEGATIVE NEGATIVE Final   C Diff interpretation No C. difficile detected.  Final    Comment: Performed at [facility], [address] [place], [place] [address]         Radiology Studies: DG Foot Complete Left  Result Date: 05/31/2019 CLINICAL DATA:  Diabetes, Charcot arthropathy EXAM: LEFT FOOT - COMPLETE 3+ VIEW COMPARISON:  04/27/2019, 12/13/2018 FINDINGS: Redemonstration of neuropathic changes of the midfoot with malalignment including fragmentation and medial dislocation of the navicular, dorsal subluxation of the cuneiforms relative to the hindfoot. There is a similar degree of fragmentation within the midfoot. Tibiotalar and tarsometatarsal joint alignment  is maintained. There is diffuse heterogeneity of the osseous structures likely reflecting demineralization. No new fractures are seen. There is diffuse soft tissue prominence and vascular calcifications. IMPRESSION: Findings of Charcot arthropathy of the left midfoot without significant interval progression compared to previous study 04/27/2019. Electronically Signed   By: Davina Poke D.O.   On: 05/31/2019 10:40        Scheduled Meds: . acetaminophen  1,000 mg Oral Q6H  . amLODipine  5 mg Oral Daily  . ARIPiprazole  10 mg Oral QHS  . atorvastatin  40 mg Oral Daily  . buPROPion  150 mg Oral q morning - 10a  . enoxaparin (LOVENOX) injection  40 mg Subcutaneous Q24H  . ferrous sulfate  325 mg Oral QODAY  . gabapentin  300 mg Oral QHS  . insulin aspart  0-15 Units Subcutaneous TID WC  . pantoprazole (PROTONIX) IV  40 mg Intravenous QHS  . vitamin B-12  1,000 mcg Oral Daily   Continuous Infusions:   LOS: 8 days    Time spent: 30 minutes    Ezekiel Slocumb, DO [facility]   If 7PM-7AM, please contact night-coverage [email or web address] 05/31/2019, 12:34 PM

## 2019-05-31 NOTE — TOC Progression Note (Addendum)
Transition of Care [facility]) - Progression Note    Patient Details  Name: [name] MRN: [id number] Date of Birth: 09/24/1965  Transition of [facility]) CM/SW Contact  Philena Obey, Gardiner Rhyme, LCSW Phone Number: 05/31/2019, 1:45 PM  Clinical Narrative:   Met with pt to discuss expanding bed search, due to her preferred facilities (sister's preferred) do not have beds. Pt has agreed for worker to expand and look in [place] and [place] area. Have re-faxed out Fl2. [place] feels needs to do a level II and are preceeding with screen. Pt is aware someone will be contacting her regarding this. Discussed with her rehab is short term 2-3 weeks and will keep her specialist's and not have to change them. Await contact from Level II screener.    Expected Discharge Plan: Home/Self Care Barriers to Discharge: Continued Medical Work up  Expected Discharge Plan and Services Expected Discharge Plan: Home/Self Care In-house Referral: Clinical Social Work Discharge Planning Services: NA   Living arrangements for the past 2 months: Single Family Home                                       Social Determinants of Health (SDOH) Interventions    Readmission Risk Interventions Readmission Risk Prevention Plan 05/24/2019 10/21/2018 10/18/2018  Post Dischage Appt - Complete -  Medication Screening - Complete -  Transportation Screening Complete Complete Complete  PCP or Specialist Appt within 5-7 Days - - -  Home Care Screening - - Complete  Medication Review (RN CM) - - Complete  Medication Review ([place]) Complete - -  PCP or Specialist appointment within 3-5 days of discharge Complete - -  [place] or [place] (No Data) - -  [place] Not Applicable - -  Some recent data might be hidden

## 2019-05-31 NOTE — Progress Notes (Signed)
[place] [facility] Day(s): 8.   Post op day(s): 3 Days Post-Op.   Interval History:  Patient seen and examined no acute events or new complaints overnight. Patient reports she continues to feel better Notes abdominal soreness at incisions but this is improving No fever, chills, nausea, or emesis JP with 160 ccs out Tolerated full liquids; + BM x 3 Leukocytosis resolved; afebrile Stable chronic anemia Mild hypomagnesemia (1.6) No new issues  Vital signs in last 24 hours: [min-max] current  Temp:  [97.4 F (36.3 C)-97.7 F (36.5 C)] 97.6 F (36.4 C) (01/29 0801) Pulse Rate:  [87-91] 87 (01/29 0801) Resp:  [17-18] 17 (01/29 0801) BP: (142-166)/(80-97) 166/97 (01/29 0801) SpO2:  [99 %-100 %] 99 % (01/29 0801)     Height: 5\' 10"  (177.8 cm) Weight: 120.2 kg BMI (Calculated): 38.02   Intake/Output last 2 shifts:  01/28 0701 - 01/29 0700 In: 994.1 [P.O.:360; I.V.:584.1] Out: 1856 [Urine:1201; Drains:160; Stool:1]   Physical Exam:  Constitutional: alert, cooperative and no distress  Respiratory: breathing non-labored at rest  Cardiovascular: regular rate and sinus rhythm  Gastrointestinal: soft, incisional soreness (improving), and non-distended, no rebound/guarding, Surgical drain in RLQ withserosanguinousoutput Integumentary:Laparoscopic incisions are CDI with dermabond, no erythema or drainage   Labs:  CBC Latest Ref Rng & Units 05/31/2019 05/30/2019 05/29/2019  WBC 4.0 - 10.5 K/uL 10.7(H) 11.2(H) 14.8(H)  Hemoglobin 12.0 - 15.0 g/dL 7.9(L) 8.3(L) 7.9(L)  Hematocrit 36.0 - 46.0 % 26.2(L) 27.7(L) 26.2(L)  Platelets 150 - 400 K/uL 395 401(H) 360   CMP Latest Ref Rng & Units 05/31/2019 05/30/2019 05/29/2019  Glucose 70 - 99 mg/dL 188(H) 141(H) 115(H)  BUN 6 - 20 mg/dL 10 10 10   Creatinine 0.44 - 1.00 mg/dL 0.91 0.99 1.17(H)  Sodium 135 - 145 mmol/L 135 137 137  Potassium 3.5 - 5.1 mmol/L 4.7 4.7 4.3  Chloride 98 - 111 mmol/L 104  105 106  CO2 22 - 32 mmol/L 24 23 20(L)  Calcium 8.9 - 10.3 mg/dL 9.1 9.3 8.6(L)  Total Protein 6.5 - 8.1 g/dL - - -  Total Bilirubin 0.3 - 1.2 mg/dL - - -  Alkaline Phos 38 - 126 U/L - - -  AST 15 - 41 U/L - - -  ALT 0 - 44 U/L - - -     Imaging studies: No new pertinent imaging studies   Assessment/Plan: 54 y.o. female doing well with return of bowel function 3 Days Post-Op s/p robotic assisted laparoscopic sigmoid colectomyfor sigmoid colon mass concerning for malignancy   - Advance to soft diet  - Discontinue IVF  - Pain control prn (minimize narcotics if feasible); antiemetics prn - Monitor abdominal examination; on-going bowel function - Continue surgical drain; monitor and record output daily - Monitor Hgb; do not suspect active bleeding - Mobilize early; PT following; recommending SNF - further management per primary service - DVT prophylaxis   - Discharge Planning: Patient tolerating soft diet and having bowel function, she is stable for discharge from surgical standpoint. Current barrier to discharge is placement for SNF  All of the above findings and recommendations were discussed with the patient, and the medical team, and all of patient's questions were answered to her expressed satisfaction.  -- Edison Simon, PA-C [facility] 05/31/2019, 8:19 AM [phone] M-F: 7am - 4pm

## 2019-05-31 NOTE — Progress Notes (Signed)
Physical Therapy Treatment Patient Details Name: [name] MRN: [id number] DOB: November 10, 1965 Today's Date: 05/31/2019    History of Present Illness [name] is a 51yoF who comes to [facility] on [facility]19 d/t Left leg pain. Patient was diagnosed with cellulitis few days ago and was started on antibiotics. Pt noted to have GIB c ABLA. 1/24 EGD and colonoscopy c both including biopsy. 1/26 sigmoid colectomy. Oncology reports no evidence of mets at this time. PMH: diabetes mellitus type 2, hypertension, hyperlipidemia, Charcot foot, schizoaffective disorders. Patient has had recent emergency department visits for both cellulitis as well as concern about patient's ability to care for self.    PT Comments    Pt is making gradual progress towards goals and appears motivated to perform therapy. Attempted ambulation this date with ability to correctly use RW. Unable to fully maintain correct WBing, therefore limited ambulation. Gave written HEP and performed while sitting in recliner. Bright affect this date and reports she is waiting for pathology report. Daughter in room. Will continue to progress as able.  Follow Up Recommendations  SNF;Supervision for mobility/OOB;Supervision - Intermittent     Equipment Recommendations  None recommended by PT    Recommendations for Other Services       Precautions / Restrictions Precautions Precautions: Fall Restrictions Weight Bearing Restrictions: Yes LLE Weight Bearing: Non weight bearing    Mobility  Bed Mobility               General bed mobility comments: not performed, received in recliner  Transfers Overall transfer level: Needs assistance Equipment used: Rolling walker (2 wheeled) Transfers: Sit to/from Stand Sit to Stand: Min guard         General transfer comment: needs cues for set up including pushing from seated surface. Once standing, upright posture performed with RW. CAM walker and surgical shoe donned. Pt  unable to maintain NWB on L LE, however appears to be PWB  Ambulation/Gait Ambulation/Gait assistance: Min guard Gait Distance (Feet): 40 Feet Assistive device: Rolling walker (2 wheeled) Gait Pattern/deviations: Step-to pattern     General Gait Details: Edcuated on importance of NWB, however pt unable to maintain. Unable to hop at this time. Ambulation performed with PWB on L LE, distance limited to room distances. Slight fatigue noted   Stairs             Wheelchair Mobility    Modified Rankin (Stroke Patients Only)       Balance Overall balance assessment: Needs assistance Sitting-balance support: Feet supported;No upper extremity supported Sitting balance-Leahy Scale: Good     Standing balance support: Bilateral upper extremity supported Standing balance-Leahy Scale: Fair                              Cognition Arousal/Alertness: Awake/alert Behavior During Therapy: WFL for tasks assessed/performed Overall Cognitive Status: Within Functional Limits for tasks assessed                                        Exercises Other Exercises Other Exercises: Seated ther-ex performed and written HEP given. THer-ex including chair sit ups, alt. marching, w/ arm raise, and LAQ. All ther-ex performed x 15 reps with safe technique    General Comments        Pertinent Vitals/Pain Pain Assessment: Faces Faces Pain Scale: Hurts a little bit Pain Location: Abdominal  incision Pain Descriptors / Indicators: Aching Pain Intervention(s): Limited activity within patient's tolerance    Home Living                      Prior Function            PT Goals (current goals can now be found in the care plan section) Acute Rehab PT Goals Patient Stated Goal: improve strength for return to home indepoendently PT Goal Formulation: With patient Time For Goal Achievement: 06/12/19 Potential to Achieve Goals: Good Progress towards PT goals:  Progressing toward goals    Frequency    Min 2X/week      PT Plan Current plan remains appropriate    Co-evaluation              AM-PAC PT "6 Clicks" Mobility   Outcome Measure  Help needed turning from your back to your side while in a flat bed without using bedrails?: A Little Help needed moving from lying on your back to sitting on the side of a flat bed without using bedrails?: A Little Help needed moving to and from a bed to a chair (including a wheelchair)?: A Little Help needed standing up from a chair using your arms (e.g., wheelchair or bedside chair)?: A Little Help needed to walk in hospital room?: A Little Help needed climbing 3-5 steps with a railing? : A Lot 6 Click Score: 17    End of Session Equipment Utilized During Treatment: Gait belt Activity Tolerance: Patient tolerated treatment well Patient left: in chair;with call bell/phone within reach;with chair alarm set Nurse Communication: Mobility status PT Visit Diagnosis: Unsteadiness on feet (R26.81);Other abnormalities of gait and mobility (R26.89);Difficulty in walking, not elsewhere classified (R26.2)     Time: 8937-3428 PT Time Calculation (min) (ACUTE ONLY): 31 min  Charges:  $Gait Training: 8-22 mins $Therapeutic Exercise: 8-22 mins                     Greggory Stallion, PT, DPT [phone]    Thurston Brendlinger 05/31/2019, 3:02 PM

## 2019-05-31 NOTE — Care Management Important Message (Signed)
Important Message  Patient Details  Name: [name] MRN: [id number] Date of Birth: Jun 23, 1965   Medicare Important Message Given:  Yes     Juliann Pulse A Chike Farrington 05/31/2019, 11:07 AM

## 2019-06-01 LAB — BASIC METABOLIC PANEL
Anion gap: 8 (ref 5–15)
BUN: 11 mg/dL (ref 6–20)
CO2: 26 mmol/L (ref 22–32)
Calcium: 9.1 mg/dL (ref 8.9–10.3)
Chloride: 104 mmol/L (ref 98–111)
Creatinine, Ser: 0.91 mg/dL (ref 0.44–1.00)
GFR calc Af Amer: >60 mL/min (ref >60)
GFR calc non Af Amer: >60 mL/min (ref >60)
Glucose, Bld: 181 mg/dL — ABNORMAL HIGH (ref 70–99)
Potassium: 4.5 mmol/L (ref 3.5–5.1)
Sodium: 138 mmol/L (ref 135–145)

## 2019-06-01 LAB — GLUCOSE, CAPILLARY
Glucose-Capillary: 194 mg/dL — ABNORMAL HIGH (ref 70–99)
Glucose-Capillary: 165 mg/dL — ABNORMAL HIGH (ref 70–99)
Glucose-Capillary: 180 mg/dL — ABNORMAL HIGH (ref 70–99)
Glucose-Capillary: 205 mg/dL — ABNORMAL HIGH (ref 70–99)

## 2019-06-01 LAB — CBC
HCT: 25 % — ABNORMAL LOW (ref 36.0–46.0)
Hemoglobin: 7.6 g/dL — ABNORMAL LOW (ref 12.0–15.0)
MCH: 24.3 pg — ABNORMAL LOW (ref 26.0–34.0)
MCHC: 30.4 g/dL (ref 30.0–36.0)
MCV: 79.9 fL — ABNORMAL LOW (ref 80.0–100.0)
Platelets: 352 10*3/uL (ref 150–400)
RBC: 3.13 MIL/uL — ABNORMAL LOW (ref 3.87–5.11)
RDW: 16.5 % — ABNORMAL HIGH (ref 11.5–15.5)
WBC: 10.1 10*3/uL (ref 4.0–10.5)
nRBC: 0 % (ref 0.0–0.2)

## 2019-06-01 LAB — MAGNESIUM: Magnesium: 1.9 mg/dL (ref 1.7–2.4)

## 2019-06-01 MED ORDER — HYDROCODONE-ACETAMINOPHEN 5-325 MG PO TABS: 1.0000 | ORAL_TABLET | Freq: Four times a day (QID) | ORAL | Status: DC | PRN

## 2019-06-01 MED ORDER — IBUPROFEN 400 MG PO TABS: 800.0000 mg | ORAL_TABLET | Freq: Four times a day (QID) | ORAL | Status: DC | PRN

## 2019-06-01 MED ORDER — PANTOPRAZOLE SODIUM 40 MG PO TBEC
40.0000 mg | DELAYED_RELEASE_TABLET | Freq: Every day | ORAL | Status: DC
  Administered 2019-06-01 – 2019-06-03 (×3): 40 mg via ORAL
  Filled 2019-06-01 (×3): qty 1

## 2019-06-01 MED ORDER — IBUPROFEN 400 MG PO TABS: 400.0000 mg | ORAL_TABLET | Freq: Four times a day (QID) | ORAL | Status: DC | PRN

## 2019-06-01 MED ORDER — TRAMADOL HCL 50 MG PO TABS: 50.0000 mg | ORAL_TABLET | Freq: Four times a day (QID) | ORAL | Status: DC | PRN

## 2019-06-01 MED ORDER — ACETAMINOPHEN 325 MG PO TABS
650.0000 mg | ORAL_TABLET | Freq: Four times a day (QID) | ORAL | Status: DC | PRN
  Administered 2019-06-01 – 2019-06-04 (×2): 650 mg via ORAL
  Filled 2019-06-01: qty 2

## 2019-06-01 NOTE — Progress Notes (Signed)
PROGRESS NOTE    [name]  CBJ:[id number] DOB: Feb 11, 1966 DOA: 05/23/2019  PCP: [name], DO    LOS - 9   Brief Narrative:  Gabriela Areola Lassiteris an 54 y.o.femalewith medical history significant ofdiabetes mellitus type 2, hypertension, hyperlipidemia, Charcot foot, schizoaffective disorder,presented to the ED on 05/22/19,accompanied by daughter because of concern for possible sepsisandright lower leg cellulitis. Patient was diagnosed with cellulitis few dayspriorandhad beenstarted on Keflex. Patient had alsohad recent EDvisits for both cellulitis as well as concern about patient's ability to care for self. In the ED, duplex ultrasound negative for DVT, left lower extremity cellulitis confirmed. Hemoglobin 8 from 10 previously, with positive heme occult stool. Admitted to hospitalist service with GI consulted. EGD and colonoscopy were performed on 05/26/19. EGD showed irregular z-line, few gastric polyps. Colonoscopy showed a likely malignant obstructing tumor in the mid-sigmoid colon. General surgery consulted and patient underwent sigmoid colectomy on 05/28/19.  Has JP drain in place. Diet advanced and patient tolerating well.  Subjective 1/30: Patient reports feeling well.  Cough has mostly resolved, no longer has congested feeling.  Tolerating soft diet without issues.  Incisional pain improved.  She inquired about status of surgical pathology, wanting to know if all the cancer was removed and what plans come next so she can let her family know.  Denies acute complaints including fever/chills, N/V/D (is having BM's), CP or SOB.  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 Colonoscopy1/24/21showed polypoid, sessile nonulcerated partially obstructing medium-sized mass in the mid sigmoid colon that wasconfirmed as colon cancer. No evidence of metastatic disease on CT chest, abdomen and pelvis.Status post sigmoid colectomy 05/28/19. --Oncologyand General Surgeryfollowing --advance diet per surgery (soft as of 1/29) --pain management --antiemetics PRN --bowel regimen --monitor JP drain output --follow up surgical pathology, still pending  Right leg cellulitis:resolved.Completed course ofKeflex.  Insulin-dependent diabetes mellitus with peripheral neuropathy: --sliding scale Novolog --continue gabapentin --resume Lantus once taking more PO  Charcot Foot: podiatry saw patient during admission for her Charcot foot.  Recommends non-weight-bearing on left foot, to wear protective boot.  Follow up in clinic in 1 month.  Hypertension: Continue antihypertensives  Hyponatremia: This is chronic/fluctuating. Improved.  Schizoaffective disorder: Continue psychotropics.  Morbid Obesity - BMI 38.02 kg/m   DVT prophylaxis:Lovenox  Code Status: Full Code Family Communication:none at bedside during encounter Disposition Plan:Stable for discharge from surgery's standpoint.  Discharge to SNF/rehab pending bed available.  Consultants:  Gastroenterology  General Surgery  Procedures:  EGD, colonoscopy 05/26/2019  Sigmoid colectomy 05/28/19  Antimicrobials:  Completed Keflex    Objective: Vitals:   05/30/19 2334 05/31/19 0801 05/31/19 1531 05/31/19 2325  BP: (!) 159/80 (!) 166/97 (!) 163/99 140/78  Pulse: 91 87 (!) 103 96  Resp: 18 17 17 16   Temp: (!) 97.4 F  (36.3 C) 97.6 F (36.4 C) 97.6 F (36.4 C) 98.1 F (36.7 C)  TempSrc: Oral Oral Oral Oral  SpO2: 99% 99% 98%  98%  Weight:      Height:        Intake/Output Summary (Last 24 hours) at 06/01/2019 1348 Last data filed at 06/01/2019 1014 Gross per 24 hour  Intake 240 ml  Output 240 ml  Net 0 ml   Filed Weights   05/23/19 1318 05/24/19 0215 05/28/19 1035  Weight: 120.2 kg 119.5 kg 120.2 kg    Examination:  General exam: awake, alert, no acute distress, obese Respiratory system: clear to auscultation bilaterally, no wheezes, rales or rhonchi, normal respiratory effort. Cardiovascular system: normal S1/S2, RRR, no pedal edema.   Gastrointestinal system: soft, non-tender, non-distended, JP drain in place with large clot sitting in the drain along with small amount serosanginous fluid, positive bowel sounds Central nervous system: alert and oriented x3. no gross focal neurologic deficits, normal speech Extremities: left foot/leg edema > right, SCD's in place Skin: dry, intact, normal temperature Psychiatry: normal mood, congruent affect, judgement and insight appear normal    Data Reviewed: I have personally reviewed following labs and imaging studies  CBC: Recent Labs  Lab 05/26/19 0540 05/29/19 0334 05/30/19 0639 05/31/19 0448 06/01/19 0537  WBC 10.0 14.8* 11.2* 10.7* 10.1  HGB 8.7* 7.9* 8.3* 7.9* 7.6*  HCT 29.0* 26.2* 27.7* 26.2* 25.0*  MCV 79.0* 79.9* 79.8* 80.1 79.9*  PLT 399 360 401* 395 045   Basic Metabolic Panel: Recent Labs  Lab 05/29/19 0334 05/30/19 0639 05/31/19 0448 06/01/19 0537  NA 137 137 135 138  K 4.3 4.7 4.7 4.5  CL 106 105 104 104  CO2 20* 23 24 26   GLUCOSE 115* 141* 188* 181*  BUN 10 10 10 11   CREATININE 1.17* 0.99 0.91 0.91  CALCIUM 8.6* 9.3 9.1 9.1  MG  --  1.7 1.6* 1.9   GFR: Estimated Creatinine Clearance: 98.4 mL/min (by C-G formula based on SCr of 0.91 mg/dL). Liver Function Tests: No results for input(s): AST, ALT, ALKPHOS,  BILITOT, PROT, ALBUMIN in the last 168 hours. No results for input(s): LIPASE, AMYLASE in the last 168 hours. No results for input(s): AMMONIA in the last 168 hours. Coagulation Profile: No results for input(s): INR, PROTIME in the last 168 hours. Cardiac Enzymes: No results for input(s): CKTOTAL, CKMB, CKMBINDEX, TROPONINI in the last 168 hours. BNP (last 3 results) No results for input(s): PROBNP in the last 8760 hours. HbA1C: No results for input(s): HGBA1C in the last 72 hours. CBG: Recent Labs  Lab 05/31/19 1147 05/31/19 1641 05/31/19 2050 06/01/19 0959 06/01/19 1135  GLUCAP 186* 184* 204* 194* 165*   Lipid Profile: No results for input(s): CHOL, HDL, LDLCALC, TRIG, CHOLHDL, LDLDIRECT in the last 72 hours. Thyroid Function Tests: No results for input(s): TSH, T4TOTAL, FREET4, T3FREE, THYROIDAB in the last 72 hours. Anemia Panel: No results for input(s): VITAMINB12, FOLATE, FERRITIN, TIBC, IRON, RETICCTPCT in the last 72 hours. Sepsis Labs: No results for input(s): PROCALCITON, LATICACIDVEN in the last 168 hours.  Recent Results (from the past 240 hour(s))  SARS CORONAVIRUS 2 (TAT 6-24 HRS) Nasopharyngeal Nasopharyngeal Swab     Status: None   Collection Time: 05/23/19 11:15 PM   Specimen: Nasopharyngeal Swab  Result Value Ref Range Status   SARS Coronavirus 2 NEGATIVE NEGATIVE Final    Comment: (NOTE) SARS-CoV-2 target nucleic acids are NOT DETECTED. The SARS-CoV-2 RNA is generally detectable in upper and lower respiratory specimens during the acute phase of infection. Negative results do not preclude SARS-CoV-2 infection, do not rule out co-infections with other pathogens, and should not  be used as the sole basis for treatment or other patient management decisions. Negative results must be combined with clinical observations, patient history, and epidemiological information. The expected result is Negative. Fact Sheet for  Patients: SugarRoll.be Fact Sheet for Healthcare Providers: https://www.woods-mathews.com/ This test is not yet approved or cleared by the [place] FDA and  has been authorized for detection and/or diagnosis of SARS-CoV-2 by FDA under an Emergency Use Authorization (EUA). This EUA will remain  in effect (meaning this test can be used) for the duration of the COVID-19 declaration under Section 56 4(b)(1) of the Act, 21 U.S.C. section 360bbb-3(b)(1), unless the authorization is terminated or revoked sooner. Performed at [facility], [place] [address], [place], [place] [address]   GI pathogen panel by PCR, stool     Status: Abnormal   Collection Time: 05/24/19 10:00 PM   Specimen: Stool  Result Value Ref Range Status   Plesiomonas shigelloides NOT DETECTED NOT DETECTED Final   Yersinia enterocolitica NOT DETECTED NOT DETECTED Final   Vibrio NOT DETECTED NOT DETECTED Final   Enteropathogenic E coli NOT DETECTED NOT DETECTED Final   E coli (ETEC) LT/ST NOT DETECTED NOT DETECTED Final   E coli 8099 by PCR Not applicable NOT DETECTED Final   Cryptosporidium by PCR DETECTED (A) NOT DETECTED Final   Entamoeba histolytica NOT DETECTED NOT DETECTED Final   Adenovirus F 40/41 NOT DETECTED NOT DETECTED Final   Norovirus GI/GII NOT DETECTED NOT DETECTED Final   Sapovirus NOT DETECTED NOT DETECTED Final    Comment: (NOTE) Performed At: [facility] [place], [place] [id number] Rush Farmer MD ZJ:[id number]    Vibrio cholerae NOT DETECTED NOT DETECTED Final   Campylobacter by PCR NOT DETECTED NOT DETECTED Final   Salmonella by PCR NOT DETECTED NOT DETECTED Final   E coli (STEC) NOT DETECTED NOT DETECTED Final   Enteroaggregative E coli NOT DETECTED NOT DETECTED Final   Shigella by PCR NOT DETECTED NOT DETECTED Final   Cyclospora cayetanensis NOT DETECTED NOT DETECTED Final   Astrovirus NOT DETECTED NOT DETECTED Final    G lamblia by PCR NOT DETECTED NOT DETECTED Final   Rotavirus A by PCR NOT DETECTED NOT DETECTED Final  C Difficile Quick Screen w PCR reflex     Status: None   Collection Time: 05/24/19 10:00 PM   Specimen: STOOL  Result Value Ref Range Status   C Diff antigen NEGATIVE NEGATIVE Final   C Diff toxin NEGATIVE NEGATIVE Final   C Diff interpretation No C. difficile detected.  Final    Comment: Performed at [facility], [address] [place], [place] [address]         Radiology Studies: DG Foot Complete Left  Result Date: 05/31/2019 CLINICAL DATA:  Diabetes, Charcot arthropathy EXAM: LEFT FOOT - COMPLETE 3+ VIEW COMPARISON:  04/27/2019, 12/13/2018 FINDINGS: Redemonstration of neuropathic changes of the midfoot with malalignment including fragmentation and medial dislocation of the navicular, dorsal subluxation of the cuneiforms relative to the hindfoot. There is a similar degree of fragmentation within the midfoot. Tibiotalar and tarsometatarsal joint alignment is maintained. There is diffuse heterogeneity of the osseous structures likely reflecting demineralization. No new fractures are seen. There is diffuse soft tissue prominence and vascular calcifications. IMPRESSION: Findings of Charcot arthropathy of the left midfoot without significant interval progression compared to previous study 04/27/2019. Electronically Signed   By: Davina Poke D.O.   On: 05/31/2019 10:40  Scheduled Meds: . ARIPiprazole  10 mg Oral QHS  . atorvastatin  40 mg Oral Daily  . buPROPion  150 mg Oral q morning - 10a  . enoxaparin (LOVENOX) injection  40 mg Subcutaneous Q24H  . ferrous sulfate  325 mg Oral QODAY  . gabapentin  300 mg Oral QHS  . hydrochlorothiazide  12.5 mg Oral Daily  . insulin aspart  0-15 Units Subcutaneous TID WC  . insulin glargine  60 Units Subcutaneous QAC breakfast  . lisinopril  20 mg Oral Daily  . pantoprazole (PROTONIX) IV  40 mg Intravenous QHS  . vitamin  B-12  1,000 mcg Oral Daily   Continuous Infusions:   LOS: 9 days    Time spent: 30-35 minutes    Ezekiel Slocumb, DO [facility]   If 7PM-7AM, please contact night-coverage [email or web address] 06/01/2019, 1:48 PM

## 2019-06-02 LAB — CBC
HCT: 27.6 % — ABNORMAL LOW (ref 36.0–46.0)
Hemoglobin: 8.4 g/dL — ABNORMAL LOW (ref 12.0–15.0)
MCH: 24.1 pg — ABNORMAL LOW (ref 26.0–34.0)
MCHC: 30.4 g/dL (ref 30.0–36.0)
MCV: 79.1 fL — ABNORMAL LOW (ref 80.0–100.0)
Platelets: 410 10*3/uL — ABNORMAL HIGH (ref 150–400)
RBC: 3.49 MIL/uL — ABNORMAL LOW (ref 3.87–5.11)
RDW: 16.7 % — ABNORMAL HIGH (ref 11.5–15.5)
WBC: 12.2 10*3/uL — ABNORMAL HIGH (ref 4.0–10.5)
nRBC: 0 % (ref 0.0–0.2)

## 2019-06-02 LAB — GLUCOSE, CAPILLARY
Glucose-Capillary: 139 mg/dL — ABNORMAL HIGH (ref 70–99)
Glucose-Capillary: 120 mg/dL — ABNORMAL HIGH (ref 70–99)
Glucose-Capillary: 179 mg/dL — ABNORMAL HIGH (ref 70–99)
Glucose-Capillary: 191 mg/dL — ABNORMAL HIGH (ref 70–99)

## 2019-06-02 MED ORDER — LISINOPRIL 20 MG PO TABS
40.0000 mg | ORAL_TABLET | Freq: Every day | ORAL | Status: DC
  Administered 2019-06-03 – 2019-06-04 (×2): 40 mg via ORAL
  Filled 2019-06-02 (×2): qty 2

## 2019-06-02 MED ORDER — INSULIN ASPART 100 UNIT/ML ~~LOC~~ SOLN
3.0000 [IU] | Freq: Three times a day (TID) | SUBCUTANEOUS | Status: DC
  Administered 2019-06-02 – 2019-06-04 (×8): 3 [IU] via SUBCUTANEOUS
  Filled 2019-06-02 (×8): qty 1

## 2019-06-02 MED ORDER — FUROSEMIDE 10 MG/ML IJ SOLN
40.0000 mg | Freq: Once | INTRAMUSCULAR | Status: AC
  Administered 2019-06-02: 40 mg via INTRAVENOUS
  Filled 2019-06-02: qty 4

## 2019-06-02 NOTE — Progress Notes (Signed)
PROGRESS NOTE    [name]  ACZ:[id number] DOB: Jul 06, 1965 DOA: 05/23/2019  PCP: [name], DO    LOS - 10   Brief Narrative:  [name] an 54 y.o.femalewith medical history significant ofdiabetes mellitus type 2, hypertension, hyperlipidemia, Charcot foot, schizoaffective disorder,presented to the ED on 05/22/19,accompanied by daughter because of concern for possible sepsisandright lower leg cellulitis. Patient was diagnosed with cellulitis few dayspriorandhad beenstarted on Keflex. Patient had alsohad recent EDvisits for both cellulitis as well as concern about patient's ability to care for self. In the ED, duplex ultrasound negative for DVT, left lower extremity cellulitis confirmed. Hemoglobin 8 from 10 previously, with positive heme occult stool. Admitted to hospitalist service with GI consulted. EGD and colonoscopy were performed on 05/26/19. EGD showed irregular z-line, few gastric polyps. Colonoscopy showed a likely malignant obstructing tumor in the mid-sigmoid colon. General surgery consulted and patient underwent sigmoid colectomy on 05/28/19.  Has JP drain in place. Diet advanced and patient tolerating well.  Subjective 1/31: Patient seen today at bedside.  No acute events reported overnight.  She denies having any pain today.  States that her JP drain had some leaking overnight.  Otherwise feeling well without acute complaints.  Assessment & Plan:   Active Problems:   DM (diabetes mellitus), type 2, 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HCC)   Polyneuropathy   Essential hypertension   Schizoaffective disorder (HCC)   Hammer toe of second toe of right foot   Hyperlipidemia associated with type 2 diabetes mellitus ([place])   Neuropathic foot ulcer, left, with fat layer exposed ([place])   Cellulitis in diabetic foot (HCC)   Hyponatremia   GI bleed   Cellulitis   Colon cancer ([place])   Adenocarcinoma of sigmoid colon  ([place])   Adenocarcinoma of Sigmoid Colon: stage to be determined, pathology pending. Colonoscopy1/24/21showed polypoid, sessile nonulcerated partially obstructing medium-sized mass in the mid sigmoid colon that wasconfirmed as colon cancer. No evidence of metastatic disease on CT chest, abdomen and pelvis.Status post sigmoid colectomy 05/28/19. --Oncologyand General Surgeryfollowing --advance diet per surgery (soft as of 1/29) --pain management --antiemetics PRN --bowel regimen --monitor JP drain output --follow up surgical pathology, still pending --Cleared for discharge by surgery, to maintain JP drain until outpatient follow-up  Right leg cellulitis:resolved.Completed course ofKeflex.  Lower extremity edema -seems slightly worse today, left greater than right. --Will give single dose of IV Lasix today and recheck --Consider daily oral Lasix or as needed for edema  Insulin-dependent diabetes mellitus with peripheral neuropathy: --sliding scale Novolog --continue gabapentin --resume Lantus once taking more PO  Charcot Foot: podiatry saw patient during admission for her Charcot foot.  Recommends non-weight-bearing on left foot, to wear protective boot.  Follow up in clinic in 1 month.  Hypertension: Continue antihypertensives  Hyponatremia: This is chronic/fluctuating. Improved.  Schizoaffective disorder: Continue psychotropics.  Morbid Obesity - BMI 38.02 kg/m   DVT prophylaxis:Lovenox  Code Status: Full Code Family Communication:none at bedside during encounter Disposition Plan:Stable for discharge from surgery's standpoint. Discharge to SNF/rehab pending bed available.  Consultants:  Gastroenterology  General Surgery  Procedures:  EGD, colonoscopy 05/26/2019  Sigmoid colectomy 05/28/19  Antimicrobials:  Completed Keflex   Objective: Vitals:   05/31/19 2325 06/01/19 1618 06/02/19 0010 06/02/19 0755  BP: 140/78 (!)  149/79 (!) 161/83 (!) 144/77  Pulse: 96 90 96 99  Resp: 16 19 17 16   Temp: 98.1 F (36.7 C) (!) 97.5 F (36.4 C) (!) 97.5 F (36.4 C) 98.2 F (36.8 C)  TempSrc: Oral Oral Oral Oral  SpO2: 98% 100% 99% 98%  Weight:      Height:        Intake/Output Summary (Last 24 hours) at 06/02/2019 1447 Last data filed at 06/02/2019 1300 Gross per 24 hour  Intake 960 ml  Output 150 ml  Net 810 ml   Filed Weights   05/23/19 1318 05/24/19 0215 05/28/19 1035  Weight: 120.2 kg 119.5 kg 120.2 kg    Examination:  General exam: awake, alert, no acute distress, obese Respiratory system: clear to auscultation bilaterally, no wheezes, rales or rhonchi, normal respiratory effort. Cardiovascular system: normal S1/S2, RRR, bilateral lower extremity edema (right.   Gastrointestinal system: soft, non-tender, non-distended abdomen, JP drain with serosanguineous fluid, dressing clean dry intact Extremities: moves all, no cyanosis, normal tone, left Charcot foot    Data Reviewed: I have personally reviewed following labs and imaging studies  CBC: Recent Labs  Lab 05/29/19 0334 05/30/19 0639 05/31/19 0448 06/01/19 0537 06/02/19 0435  WBC 14.8* 11.2* 10.7* 10.1 12.2*  HGB 7.9* 8.3* 7.9* 7.6* 8.4*  HCT 26.2* 27.7* 26.2* 25.0* 27.6*  MCV 79.9* 79.8* 80.1 79.9* 79.1*  PLT 360 401* 395 352 102*   Basic Metabolic Panel: Recent Labs  Lab 05/29/19 0334 05/30/19 0639 05/31/19 0448 06/01/19 0537  NA 137 137 135 138  K 4.3 4.7 4.7 4.5  CL 106 105 104 104  CO2 20* 23 24 26   GLUCOSE 115* 141* 188* 181*  BUN 10 10 10 11   CREATININE 1.17* 0.99 0.91 0.91  CALCIUM 8.6* 9.3 9.1 9.1  MG  --  1.7 1.6* 1.9   GFR: Estimated Creatinine Clearance: 98.4 mL/min (by C-G formula based on SCr of 0.91 mg/dL). Liver Function Tests: No results for input(s): AST, ALT, ALKPHOS, BILITOT, PROT, ALBUMIN in the last 168 hours. No results for input(s): LIPASE, AMYLASE in the last 168 hours. No results for input(s):  AMMONIA in the last 168 hours. Coagulation Profile: No results for input(s): INR, PROTIME in the last 168 hours. Cardiac Enzymes: No results for input(s): CKTOTAL, CKMB, CKMBINDEX, TROPONINI in the last 168 hours. BNP (last 3 results) No results for input(s): PROBNP in the last 8760 hours. HbA1C: No results for input(s): HGBA1C in the last 72 hours. CBG: Recent Labs  Lab 06/01/19 1135 06/01/19 1638 06/01/19 2112 06/02/19 0754 06/02/19 1203  GLUCAP 165* 180* 205* 139* 120*   Lipid Profile: No results for input(s): CHOL, HDL, LDLCALC, TRIG, CHOLHDL, LDLDIRECT in the last 72 hours. Thyroid Function Tests: No results for input(s): TSH, T4TOTAL, FREET4, T3FREE, THYROIDAB in the last 72 hours. Anemia Panel: No results for input(s): VITAMINB12, FOLATE, FERRITIN, TIBC, IRON, RETICCTPCT in the last 72 hours. Sepsis Labs: No results for input(s): PROCALCITON, LATICACIDVEN in the last 168 hours.  Recent Results (from the past 240 hour(s))  SARS CORONAVIRUS 2 (TAT 6-24 HRS) Nasopharyngeal Nasopharyngeal Swab     Status: None   Collection Time: 05/23/19 11:15 PM   Specimen: Nasopharyngeal Swab  Result Value Ref Range Status   SARS Coronavirus 2 NEGATIVE NEGATIVE Final    Comment: (NOTE) SARS-CoV-2 target nucleic acids are NOT DETECTED. The SARS-CoV-2 RNA is generally detectable in upper and lower respiratory specimens during the acute phase of infection. Negative results do not preclude SARS-CoV-2 infection, do not rule out co-infections with other pathogens, and should not be used as the sole basis for treatment or other patient management decisions. Negative results must be combined with clinical observations, patient history, and epidemiological information.  The expected result is Negative. Fact Sheet for Patients: SugarRoll.be Fact Sheet for Healthcare Providers: https://www.woods-mathews.com/ This test is not yet approved or cleared by  the [place] FDA and  has been authorized for detection and/or diagnosis of SARS-CoV-2 by FDA under an Emergency Use Authorization (EUA). This EUA will remain  in effect (meaning this test can be used) for the duration of the COVID-19 declaration under Section 56 4(b)(1) of the Act, 21 U.S.C. section 360bbb-3(b)(1), unless the authorization is terminated or revoked sooner. Performed at [facility], [place] [address], [place], [place] [address]   GI pathogen panel by PCR, stool     Status: Abnormal   Collection Time: 05/24/19 10:00 PM   Specimen: Stool  Result Value Ref Range Status   Plesiomonas shigelloides NOT DETECTED NOT DETECTED Final   Yersinia enterocolitica NOT DETECTED NOT DETECTED Final   Vibrio NOT DETECTED NOT DETECTED Final   Enteropathogenic E coli NOT DETECTED NOT DETECTED Final   E coli (ETEC) LT/ST NOT DETECTED NOT DETECTED Final   E coli 3428 by PCR Not applicable NOT DETECTED Final   Cryptosporidium by PCR DETECTED (A) NOT DETECTED Final   Entamoeba histolytica NOT DETECTED NOT DETECTED Final   Adenovirus F 40/41 NOT DETECTED NOT DETECTED Final   Norovirus GI/GII NOT DETECTED NOT DETECTED Final   Sapovirus NOT DETECTED NOT DETECTED Final    Comment: (NOTE) Performed At: [facility] [place], [place] [id number] Rush Farmer MD OM:[id number]    Vibrio cholerae NOT DETECTED NOT DETECTED Final   Campylobacter by PCR NOT DETECTED NOT DETECTED Final   Salmonella by PCR NOT DETECTED NOT DETECTED Final   E coli (STEC) NOT DETECTED NOT DETECTED Final   Enteroaggregative E coli NOT DETECTED NOT DETECTED Final   Shigella by PCR NOT DETECTED NOT DETECTED Final   Cyclospora cayetanensis NOT DETECTED NOT DETECTED Final   Astrovirus NOT DETECTED NOT DETECTED Final   G lamblia by PCR NOT DETECTED NOT DETECTED Final   Rotavirus A by PCR NOT DETECTED NOT DETECTED Final  C Difficile Quick Screen w PCR reflex     Status: None   Collection  Time: 05/24/19 10:00 PM   Specimen: STOOL  Result Value Ref Range Status   C Diff antigen NEGATIVE NEGATIVE Final   C Diff toxin NEGATIVE NEGATIVE Final   C Diff interpretation No C. difficile detected.  Final    Comment: Performed at [facility], [address], [place], [place] [address]         Radiology Studies: No results found.      Scheduled Meds: . ARIPiprazole  10 mg Oral QHS  . atorvastatin  40 mg Oral Daily  . buPROPion  150 mg Oral q morning - 10a  . enoxaparin (LOVENOX) injection  40 mg Subcutaneous Q24H  . ferrous sulfate  325 mg Oral QODAY  . gabapentin  300 mg Oral QHS  . hydrochlorothiazide  12.5 mg Oral Daily  . insulin aspart  0-15 Units Subcutaneous TID WC  . insulin aspart  3 Units Subcutaneous TID WC  . insulin glargine  60 Units Subcutaneous QAC breakfast  . lisinopril  20 mg Oral Daily  . pantoprazole  40 mg Oral QHS  . vitamin B-12  1,000 mcg Oral Daily   Continuous Infusions:   LOS: 10 days    Time spent: 20 minutes    Ezekiel Slocumb, DO [facility]   If 7PM-7AM, please contact night-coverage [email or web address]  06/02/2019, 2:47 PM

## 2019-06-02 NOTE — Progress Notes (Signed)
Subjective:  CC: [name] is a 54 y.o. female  Hospital stay day 27, 5 Days Post-Op s/p robotic assisted laparoscopic sigmoid colectomyfor sigmoid colon mass concerning for malignancy  HPI: No issues overnight.  Eating well, voiding and having BMs.  ROS:  General: Denies weight loss, weight gain, fatigue, fevers, chills, and night sweats. Heart: Denies chest pain, palpitations, racing heart, irregular heartbeat, leg pain or swelling, and decreased activity tolerance. Respiratory: Denies breathing difficulty, shortness of breath, wheezing, cough, and sputum. GI: Denies change in appetite, heartburn, nausea, vomiting, constipation, diarrhea, and blood in stool. GU: Denies difficulty urinating, pain with urinating, urgency, frequency, blood in urine.   Objective:   Temp:  [97.5 F (36.4 C)-98.2 F (36.8 C)] 98.2 F (36.8 C) (01/31 0755) Pulse Rate:  [90-99] 99 (01/31 0755) Resp:  [16-19] 16 (01/31 0755) BP: (144-161)/(77-83) 144/77 (01/31 0755) SpO2:  [98 %-100 %] 98 % (01/31 0755)     Height: 5\' 10"  (177.8 cm) Weight: 120.2 kg BMI (Calculated): 38.02   Intake/Output this shift:   Intake/Output Summary (Last 24 hours) at 06/02/2019 1304 Last data filed at 06/02/2019 0900 Gross per 24 hour  Intake 720 ml  Output 150 ml  Net 570 ml    Constitutional :  alert, cooperative, appears stated age and no distress  Respiratory:  clear to auscultation bilaterally  Cardiovascular:  regular rate and rhythm  Gastrointestinal: soft, non-tender; bowel sounds normal; no masses,  no organomegaly.   Skin: Cool and moist. Inicisions, clean, JP with serous fluid  Psychiatric: Normal affect, non-agitated, not confused       LABS:  CMP Latest Ref Rng & Units 06/01/2019 05/31/2019 05/30/2019  Glucose 70 - 99 mg/dL 181(H) 188(H) 141(H)  BUN 6 - 20 mg/dL 11 10 10   Creatinine 0.44 - 1.00 mg/dL 0.91 0.91 0.99  Sodium 135 - 145 mmol/L 138 135 137  Potassium 3.5 - 5.1 mmol/L 4.5 4.7 4.7   Chloride 98 - 111 mmol/L 104 104 105  CO2 22 - 32 mmol/L 26 24 23   Calcium 8.9 - 10.3 mg/dL 9.1 9.1 9.3  Total Protein 6.5 - 8.1 g/dL - - -  Total Bilirubin 0.3 - 1.2 mg/dL - - -  Alkaline Phos 38 - 126 U/L - - -  AST 15 - 41 U/L - - -  ALT 0 - 44 U/L - - -   CBC Latest Ref Rng & Units 06/02/2019 06/01/2019 05/31/2019  WBC 4.0 - 10.5 K/uL 12.2(H) 10.1 10.7(H)  Hemoglobin 12.0 - 15.0 g/dL 8.4(L) 7.6(L) 7.9(L)  Hematocrit 36.0 - 46.0 % 27.6(L) 25.0(L) 26.2(L)  Platelets 150 - 400 K/uL 410(H) 352 395    RADS: n/a Assessment:   s/p robotic assisted laparoscopic sigmoid colectomyfor sigmoid colon mass concerning for malignancy.  Doing well.  Continue JP drainage due to persistent drainage.  Looking serous and asymptomatic.  Ok to be d/c'd and f/u as outpt from surgery standpoint once arrangements are made

## 2019-06-03 ENCOUNTER — Inpatient Hospital Stay: Payer: Medicare Other

## 2019-06-03 LAB — CBC WITH DIFFERENTIAL/PLATELET
Abs Immature Granulocytes: 0.09 10*3/uL — ABNORMAL HIGH (ref 0.00–0.07)
Basophils Absolute: 0.1 10*3/uL (ref 0.0–0.1)
Basophils Relative: 1 %
Eosinophils Absolute: 0.9 10*3/uL — ABNORMAL HIGH (ref 0.0–0.5)
Eosinophils Relative: 6 %
HCT: 27.5 % — ABNORMAL LOW (ref 36.0–46.0)
Hemoglobin: 8.4 g/dL — ABNORMAL LOW (ref 12.0–15.0)
Immature Granulocytes: 1 %
Lymphocytes Relative: 20 %
Lymphs Abs: 3 10*3/uL (ref 0.7–4.0)
MCH: 24 pg — ABNORMAL LOW (ref 26.0–34.0)
MCHC: 30.5 g/dL (ref 30.0–36.0)
MCV: 78.6 fL — ABNORMAL LOW (ref 80.0–100.0)
Monocytes Absolute: 0.9 10*3/uL (ref 0.1–1.0)
Monocytes Relative: 6 %
Neutro Abs: 9.9 10*3/uL — ABNORMAL HIGH (ref 1.7–7.7)
Neutrophils Relative %: 66 %
Platelets: 425 10*3/uL — ABNORMAL HIGH (ref 150–400)
RBC: 3.5 MIL/uL — ABNORMAL LOW (ref 3.87–5.11)
RDW: 16.7 % — ABNORMAL HIGH (ref 11.5–15.5)
WBC: 14.9 10*3/uL — ABNORMAL HIGH (ref 4.0–10.5)
nRBC: 0 % (ref 0.0–0.2)

## 2019-06-03 LAB — URINALYSIS, ROUTINE W REFLEX MICROSCOPIC
Bilirubin Urine: NEGATIVE
Glucose, UA: NEGATIVE mg/dL
Hgb urine dipstick: NEGATIVE
Ketones, ur: NEGATIVE mg/dL
Nitrite: NEGATIVE
Protein, ur: NEGATIVE mg/dL
Specific Gravity, Urine: 1.008 (ref 1.005–1.030)
pH: 6 (ref 5.0–8.0)

## 2019-06-03 LAB — GLUCOSE, CAPILLARY
Glucose-Capillary: 169 mg/dL — ABNORMAL HIGH (ref 70–99)
Glucose-Capillary: 171 mg/dL — ABNORMAL HIGH (ref 70–99)
Glucose-Capillary: 155 mg/dL — ABNORMAL HIGH (ref 70–99)
Glucose-Capillary: 155 mg/dL — ABNORMAL HIGH (ref 70–99)

## 2019-06-03 MED ORDER — PIPERACILLIN-TAZOBACTAM 3.375 G IVPB
3.3750 g | Freq: Three times a day (TID) | INTRAVENOUS | Status: DC
  Administered 2019-06-03 – 2019-06-04 (×2): 3.375 g via INTRAVENOUS
  Filled 2019-06-03 (×2): qty 50

## 2019-06-03 MED ORDER — FUROSEMIDE 40 MG PO TABS
40.0000 mg | ORAL_TABLET | Freq: Every day | ORAL | Status: DC
  Administered 2019-06-04: 40 mg via ORAL
  Filled 2019-06-03: qty 1

## 2019-06-03 MED ORDER — PIPERACILLIN-TAZOBACTAM 3.375 G IVPB
3.3750 g | Freq: Three times a day (TID) | INTRAVENOUS | Status: DC
  Administered 2019-06-03: 3.375 g via INTRAVENOUS
  Filled 2019-06-03: qty 50

## 2019-06-03 MED ORDER — FUROSEMIDE 10 MG/ML IJ SOLN
40.0000 mg | Freq: Once | INTRAMUSCULAR | Status: AC
  Administered 2019-06-03: 40 mg via INTRAVENOUS
  Filled 2019-06-03: qty 4

## 2019-06-03 NOTE — Progress Notes (Signed)
   06/03/19 1900  Clinical Encounter Type  Visited With Patient  Visit Type Follow-up  Referral From Chaplain  Consult/Referral To Chaplain  Spiritual Encounters  Spiritual Needs Prayer;Emotional  Stress Factors  Patient Stress Factors Health changes;Major life changes  Chaplain visited with patient as a follow up. Patient was sitting on the side of the bed when Chaplain arrived. Patient appeared to be in good spirits and stated that she was about to leave the hospital. During this time, patient showed concern that she was nervous about her transition out of the hospital. However, she was familiar where she was going so this put her at ease. Patient also stated that she was unsure how her prognosis was going to come out. However, she was going to put it in the Lord's hands. Patient is expecting to go to rehabilitation and recover. Chaplain offered prayer. End of visit.

## 2019-06-03 NOTE — TOC Progression Note (Signed)
Transition of Care [facility]) - Progression Note    Patient Details  Name: [name] MRN: [id number] Date of Birth: 1966/04/09  Transition of Care [facility]) CM/SW Contact  Su Hilt, RN Phone Number: 06/03/2019, 3:10 PM  Clinical Narrative:     Spoke with the patient at length about the 2 bed offers available, she has been to [facility] in the past and would like to go again, She agreed for me to start the insurance auth, I notified Claiborne Billings with [facility] and called [place] to get insurance [place], ref number [id number], faxed clinical to [phone]  Expected Discharge Plan: Home/Self Care Barriers to Discharge: Continued Medical Work up  Expected Discharge Plan and Services Expected Discharge Plan: Home/Self Care In-house Referral: Clinical Social Work Discharge Planning Services: NA   Living arrangements for the past 2 months: Single Family Home                                       Social Determinants of Health (SDOH) Interventions    Readmission Risk Interventions Readmission Risk Prevention Plan 05/24/2019 10/21/2018 10/18/2018  Post Dischage Appt - Complete -  Medication Screening - Complete -  Transportation Screening Complete Complete Complete  PCP or Specialist Appt within 5-7 Days - - -  Home Care Screening - - Complete  Medication Review (RN CM) - - Complete  Medication Review (RN Care Manager) Complete - -  PCP or Specialist appointment within 3-5 days of discharge Complete - -  [place] or [place] (No Data) - -  [place] Not Applicable - -  Some recent data might be hidden

## 2019-06-03 NOTE — Progress Notes (Signed)
[place] Hospital Day(s): 11.   Post op day(s): 6 Days Post-Op.   Interval History:  Patient seen and examined no acute events or new complaints overnight.  Patient reports she continues to do well No fever, chills, nausea, or emesis Slight increase in leukocytosis to 14K; afebrile JP with 90 ccs out in last 24 hours; serous She has tolerated soft diet without issue; + bowel fucntion Awaiting Rehab/SNF placement    Vital signs in last 24 hours: [min-max] current  Temp:  [98 F (36.7 C)-98.2 F (36.8 C)] 98 F (36.7 C) (01/31 2330) Pulse Rate:  [98-99] 98 (01/31 2330) Resp:  [14-18] 14 (01/31 2330) BP: (140-144)/(76-78) 143/78 (01/31 2330) SpO2:  [98 %-99 %] 99 % (01/31 2330)     Height: 5\' 10"  (177.8 cm) Weight: 120.2 kg BMI (Calculated): 38.02   Intake/Output last 2 shifts:  01/31 0701 - 02/01 0700 In: 720 [P.O.:720] Out: 440 [Urine:350; Drains:90]   Physical Exam:  Constitutional: alert, cooperative and no distress  Respiratory: breathing non-labored at rest  Cardiovascular: regular rate and sinus rhythm  Gastrointestinal: soft, non-tender, and non-distended, no rebound/guarding, Surgical drain in RLQ withserousoutput Integumentary:Laparoscopic incisions are CDI with dermabond, no erythema or drainage   Labs:  CBC Latest Ref Rng & Units 06/03/2019 06/02/2019 06/01/2019  WBC 4.0 - 10.5 K/uL 14.9(H) 12.2(H) 10.1  Hemoglobin 12.0 - 15.0 g/dL 8.4(L) 8.4(L) 7.6(L)  Hematocrit 36.0 - 46.0 % 27.5(L) 27.6(L) 25.0(L)  Platelets 150 - 400 K/uL 425(H) 410(H) 352   CMP Latest Ref Rng & Units 06/01/2019 05/31/2019 05/30/2019  Glucose 70 - 99 mg/dL 181(H) 188(H) 141(H)  BUN 6 - 20 mg/dL 11 10 10   Creatinine 0.44 - 1.00 mg/dL 0.91 0.91 0.99  Sodium 135 - 145 mmol/L 138 135 137  Potassium 3.5 - 5.1 mmol/L 4.5 4.7 4.7  Chloride 98 - 111 mmol/L 104 104 105  CO2 22 - 32 mmol/L 26 24 23   Calcium 8.9 - 10.3 mg/dL 9.1 9.1 9.3  Total Protein  6.5 - 8.1 g/dL - - -  Total Bilirubin 0.3 - 1.2 mg/dL - - -  Alkaline Phos 38 - 126 U/L - - -  AST 15 - 41 U/L - - -  ALT 0 - 44 U/L - - -     Imaging studies: No new pertinent imaging studies   Assessment/Plan:  54 y.o. female overall doing well 6 Days Post-Op s/p robotic assisted laparoscopic sigmoid colectomyfor sigmoid colon mass concerning for malignancy   - Continue Soft/regular Diet             - Pain control prn (minimize narcotics if feasible); antiemetics prn - Monitor abdominal examination; on-going bowel function - Will remove surgical drain today - Continue Mobilization;PT following; recommending SNF - further management per primary service -DVT prophylaxis              - Discharge Planning: General surgery will sign off, Patient tolerating soft diet and having bowel function, she is stable for discharge from surgical standpoint. Current barrier to discharge is placement for SNF. She will need general surgery follow up in ~2 weeks after discharge. We will follow up on pathology results once they have resulted; please call with questions or concerns  All of the above findings and recommendations were discussed with the patient, and the medical team, and all of patient's questions were answered to her expressed satisfaction.  -- Edison Simon, PA-C [facility] 06/03/2019, 7:38 AM [phone] M-F:  7am - 4pm

## 2019-06-03 NOTE — Progress Notes (Signed)
Physical Therapy Treatment Patient Details Name: [name] MRN: [id number] DOB: 07-Jul-1965 Today's Date: 06/03/2019    History of Present Illness [name] is a 37yoF who comes to [facility] on 05/23/19 d/t Left leg pain. Patient was diagnosed with cellulitis few days ago and was started on antibiotics. Pt noted to have GIB c ABLA. 1/24 EGD and colonoscopy c both including biopsy. 1/26 sigmoid colectomy. Oncology reports no evidence of mets at this time. PMH: diabetes mellitus type 2, hypertension, hyperlipidemia, Charcot foot, schizoaffective disorders. Patient has had recent emergency department visits for both cellulitis as well as concern about patient's ability to care for self.    PT Comments    Bed mobility without assist today.  Assist to don CAM boot L and post op shoe R.  She is able to stand and walk 40' in room with instruction and encouragement for NWB but she remains unable but is able to offload using BUE's and maintain PWB.  Gait is limited due to inability to maintain NWB per orders.  She stated she is completing her HEP on her own and it makes her feel better.  She is able to recall ex's once in the chair.  Encouragement to complete 3-4 times per day and voiced understanding.   Follow Up Recommendations  SNF;Supervision for mobility/OOB;Supervision - Intermittent     Equipment Recommendations  None recommended by PT    Recommendations for Other Services       Precautions / Restrictions Precautions Precautions: Fall Required Braces or Orthoses: Other Brace Other Brace: Charcot foot in CAM rocker; surgical shoe contralateral; JPJ; Restrictions Weight Bearing Restrictions: No    Mobility  Bed Mobility Overal bed mobility: Modified Independent Bed Mobility: Supine to Sit     Supine to sit: Modified independent (Device/Increase time)     General bed mobility comments: no assist needed with rail  Transfers Overall transfer level: Needs  assistance Equipment used: Rolling walker (2 wheeled) Transfers: Sit to/from Stand Sit to Stand: Min guard         General transfer comment: needs cues for set up including pushing from seated surface. Once standing, upright posture performed with RW. CAM walker and surgical shoe donned. Pt unable to maintain NWB on L LE, however appears to be PWB  Ambulation/Gait Ambulation/Gait assistance: Min guard Gait Distance (Feet): 40 Feet Assistive device: Rolling walker (2 wheeled) Gait Pattern/deviations: Step-to pattern Gait velocity: decreased   General Gait Details: Edcuated on importance of NWB, however pt unable to maintain. Unable to hop at this time. Ambulation performed with PWB on L LE, distance limited to room distances. Slight fatigue noted   Stairs             Wheelchair Mobility    Modified Rankin (Stroke Patients Only)       Balance Overall balance assessment: Needs assistance Sitting-balance support: Feet supported;No upper extremity supported Sitting balance-Leahy Scale: Good     Standing balance support: Bilateral upper extremity supported Standing balance-Leahy Scale: Fair                              Cognition Arousal/Alertness: Awake/alert Behavior During Therapy: WFL for tasks assessed/performed Overall Cognitive Status: Within Functional Limits for tasks assessed  Exercises Other Exercises Other Exercises: BLE marches and LAQ, UE - ab/add, bicep curls, punches and shoulder flexion x 15    General Comments        Pertinent Vitals/Pain Pain Assessment: Faces Faces Pain Scale: Hurts a little bit Pain Location: Abdominal incision Pain Descriptors / Indicators: Sore Pain Intervention(s): Limited activity within patient's tolerance;Monitored during session;Repositioned    Home Living                      Prior Function            PT Goals (current goals can  now be found in the care plan section) Progress towards PT goals: Progressing toward goals    Frequency    Min 2X/week      PT Plan Current plan remains appropriate    Co-evaluation              AM-PAC PT "6 Clicks" Mobility   Outcome Measure  Help needed turning from your back to your side while in a flat bed without using bedrails?: None Help needed moving from lying on your back to sitting on the side of a flat bed without using bedrails?: A Little Help needed moving to and from a bed to a chair (including a wheelchair)?: A Little Help needed standing up from a chair using your arms (e.g., wheelchair or bedside chair)?: A Little Help needed to walk in hospital room?: A Little Help needed climbing 3-5 steps with a railing? : A Lot 6 Click Score: 18    End of Session Equipment Utilized During Treatment: Gait belt Activity Tolerance: Patient tolerated treatment well Patient left: in chair;with call bell/phone within reach;with chair alarm set;with nursing/sitter in room Nurse Communication: Mobility status       Time: 9470-9628 PT Time Calculation (min) (ACUTE ONLY): 12 min  Charges:  $Gait Training: 8-22 mins                    Chesley Noon, PTA 06/03/19, 9:25 AM

## 2019-06-03 NOTE — Consult Note (Signed)
Pharmacy Antibiotic Note  [name] is a 54 y.o. female admitted on 05/23/2019 with rising leukocytosis following surgery.  [name] is a 53Hospital stay day 10, 5 Days Post-Op s/p robotic assisted laparoscopic sigmoid colectomyfor sigmoid colon mass concerning for malignancy.   Pharmacy has been consulted for Zosyn dosing.  WBC trend 10.1>12.2>14.9  Plan: Zosyn 3.375g IV q8h (4 hour infusion).  Height: 5\' 10"  (177.8 cm) Weight: 265 lb (120.2 kg) IBW/kg (Calculated) : 68.5  Temp (24hrs), Avg:98.1 F (36.7 C), Min:98 F (36.7 C), Max:98.2 F (36.8 C)  Recent Labs  Lab 05/29/19 0334 05/29/19 0334 05/30/19 0639 05/31/19 0448 06/01/19 0537 06/02/19 0435 06/03/19 0446  WBC 14.8*   < > 11.2* 10.7* 10.1 12.2* 14.9*  CREATININE 1.17*  --  0.99 0.91 0.91  --   --    < > = values in this interval not displayed.    Estimated Creatinine Clearance: 98.4 mL/min (by C-G formula based on SCr of 0.91 mg/dL).    No Known Allergies  Antimicrobials this admission: Ceftriaxone 1/22 > 1/23 Cephalexin 1/24>1/25 Ertapenem 1/26>1/27 Metronidazole/Neomycin 1/25 x 1  Zosyn 2/1 >>  Dose adjustments this admission: N/A  Microbiology results: GI panel 1/22 > Cryptosporidium C difficile 1/22 > NEG/NEG COVID 1/21 > NEG  Thank you for allowing pharmacy to be a part of this patient's care.  Lu Duffel, PharmD, BCPS Clinical Pharmacist 06/03/2019 7:24 AM

## 2019-06-03 NOTE — Care Management Important Message (Signed)
Important Message  Patient Details  Name: [name] MRN: [id number] Date of Birth: 1965-08-02   Medicare Important Message Given:  Yes     Juliann Pulse A Ravi Tuccillo 06/03/2019, 1:46 PM

## 2019-06-03 NOTE — Progress Notes (Signed)
PROGRESS NOTE    [name]  FGH:[id number] DOB: 24-Nov-1965 DOA: 05/23/2019  PCP: [name], DO    LOS - 11   Brief Narrative:  [name] an 54 y.o.femalewith medical history significant ofdiabetes mellitus type 2, hypertension, hyperlipidemia, Charcot foot, schizoaffective disorder,presented to the ED on 05/22/19,accompanied by daughter because of concern for possible sepsisandright lower leg cellulitis. Patient was diagnosed with cellulitis few dayspriorandhad beenstarted on Keflex. Patient had alsohad recent EDvisits for both cellulitis as well as concern about patient's ability to care for self. In the ED, duplex ultrasound negative for DVT, left lower extremity cellulitis confirmed. Hemoglobin 8 from 10 previously, with positive heme occult stool. Admitted to hospitalist service with GI consulted. EGD and colonoscopy were performed on 05/26/19. EGD showed irregular z-line, few gastric polyps. Colonoscopy showed a likely malignant obstructing tumor in the mid-sigmoid colon. General surgery consulted and patient underwent sigmoid colectomy on 05/28/19.Has JP drain in place. Diet advanced and patient tolerating well.  Subjective 2/1: Patient this morning at bedside.  Reports feeling well.  No acute events reported.  She denies any fevers or chills, abdominal pain, nausea vomiting or diarrhea, cough resolved, no dysuria or diarrhea.  Reports she had some gas pain after drinking Ensure earlier this morning.  Denies increased redness or pain of her lower extremities.  Assessment & Plan:   Active Problems:   DM (diabetes mellitus), type 2, 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HCC)   Polyneuropathy   Essential hypertension   Schizoaffective disorder (HCC)   Hammer toe of second toe of right foot   Hyperlipidemia associated with type 2 diabetes mellitus ([place])   Neuropathic foot ulcer, left, with fat layer exposed ([place])   Cellulitis in  diabetic foot (HCC)   Hyponatremia   GI bleed   Cellulitis   Colon cancer (HCC)   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HCC)   Leukocytosis -past 2 days white count increasing, up to 14.9 today.  Patient without signs or symptoms of infection at this point.  Presented with cellulitis of her right lower extremity, completed Keflex.  Both lower extremities currently without any sign of cellulitis recurrence.  JP drain removed by surgery this morning.  Her abdominal exam is benign, but with drain in place since surgery, abdomen could be a source potentially --Chest x-ray, urinalysis, blood cultures to evaluate for any infection --If above evaluation negative and leukocytosis persists or worsens would scan her abdomen --CXR negative --UA and culture pending --empiric Unasyn for now  Adenocarcinoma of Sigmoid Colon: stage to be determined, pathology pending. Colonoscopy1/24/21showed polypoid, sessile nonulcerated partially obstructing medium-sized mass in the mid sigmoid colon that wasconfirmed as colon cancer. No evidence of metastatic disease on CT chest, abdomen and pelvis.Status post sigmoid colectomy 05/28/19 with JP drain in place.  Drain removed 2/1. --Oncologyand General Surgeryfollowing peripherally  --advance diet per surgery(soft as of 1/29) --pain management --antiemetics PRN --bowel regimen --follow up surgical pathology, still pending --Cleared for discharge by surgery  Right leg cellulitis:resolved.Completed course ofKeflex.  No erythema of either lower extremity.  Lower extremity edema -seems slightly worse, left greater than right. --repeat IV dose of Lasix today --start Lasix 40 mg PO daily tomorrow, d/c HCTZ --monitor renal function & electrolytes  Insulin-dependent diabetes mellitus with peripheral neuropathy: --sliding scale Novolog --continue gabapentin --resume Lantus once taking more PO  Charcot Foot: podiatry saw patient during admission for her  Charcot foot. Recommends non-weight-bearing on left foot, to wear protective boot. Follow up in clinic in 1 month.  Hypertension: Continue antihypertensives  Hyponatremia: This is chronic/fluctuating. Improved.  Schizoaffective disorder: Continue psychotropics.  Morbid Obesity - BMI 38.02 kg/m   DVT prophylaxis:Lovenox  Code Status: Full Code Family Communication:none at bedside during encounter Disposition Plan:Stable for discharge from surgery's standpoint. Discharge to SNF/rehab pending bed available.  Consultants:  Gastroenterology  General Surgery  Procedures:  EGD, colonoscopy 05/26/2019  Sigmoid colectomy 05/28/2019  JP drain removal 06/03/2019  Antimicrobials:  Completed Keflex    Objective: Vitals:   06/02/19 1614 06/02/19 2330 06/03/19 0752 06/03/19 1101  BP: 140/76 (!) 143/78 (!) 156/80 (!) 189/96  Pulse: 98 98 93 67  Resp: 18 14 17    Temp:  98 F (36.7 C) 98.1 F (36.7 C)   TempSrc:  Oral Oral   SpO2: 99% 99% 100% 97%  Weight:      Height:        Intake/Output Summary (Last 24 hours) at 06/03/2019 1515 Last data filed at 06/03/2019 1135 Gross per 24 hour  Intake 360 ml  Output 620 ml  Net -260 ml   Filed Weights   05/23/19 1318 05/24/19 0215 05/28/19 1035  Weight: 120.2 kg 119.5 kg 120.2 kg    Examination:  General exam: awake, alert, no acute distress, obese HEENT: clear conjunctiva, anicteric sclera, moist mucus membranes, hearing grossly normal  Respiratory system: clear to auscultation bilaterally, no wheezes, rales or rhonchi, normal respiratory effort. Cardiovascular system: normal S1/S2, RRR, no JVD, b/l LE pitting edema.   Gastrointestinal system: soft, non-tender, non-distended abdomen, JP drain out. Central nervous system: alert and oriented x3. no gross focal neurologic deficits, normal speech Extremities: moves all, no cyanosis, normal tone Psychiatry: normal mood, congruent affect, judgement and insight  appear normal    Data Reviewed: I have personally reviewed following labs and imaging studies  CBC: Recent Labs  Lab 05/30/19 0639 05/31/19 0448 06/01/19 0537 06/02/19 0435 06/03/19 0446  WBC 11.2* 10.7* 10.1 12.2* 14.9*  NEUTROABS  --   --   --   --  9.9*  HGB 8.3* 7.9* 7.6* 8.4* 8.4*  HCT 27.7* 26.2* 25.0* 27.6* 27.5*  MCV 79.8* 80.1 79.9* 79.1* 78.6*  PLT 401* 395 352 410* 175*   Basic Metabolic Panel: Recent Labs  Lab 05/29/19 0334 05/30/19 0639 05/31/19 0448 06/01/19 0537  NA 137 137 135 138  K 4.3 4.7 4.7 4.5  CL 106 105 104 104  CO2 20* 23 24 26   GLUCOSE 115* 141* 188* 181*  BUN 10 10 10 11   CREATININE 1.17* 0.99 0.91 0.91  CALCIUM 8.6* 9.3 9.1 9.1  MG  --  1.7 1.6* 1.9   GFR: Estimated Creatinine Clearance: 98.4 mL/min (by C-G formula based on SCr of 0.91 mg/dL). Liver Function Tests: No results for input(s): AST, ALT, ALKPHOS, BILITOT, PROT, ALBUMIN in the last 168 hours. No results for input(s): LIPASE, AMYLASE in the last 168 hours. No results for input(s): AMMONIA in the last 168 hours. Coagulation Profile: No results for input(s): INR, PROTIME in the last 168 hours. Cardiac Enzymes: No results for input(s): CKTOTAL, CKMB, CKMBINDEX, TROPONINI in the last 168 hours. BNP (last 3 results) No results for input(s): PROBNP in the last 8760 hours. HbA1C: No results for input(s): HGBA1C in the last 72 hours. CBG: Recent Labs  Lab 06/02/19 1203 06/02/19 1657 06/02/19 2105 06/03/19 0750 06/03/19 1202  GLUCAP 120* 179* 191* 169* 171*   Lipid Profile: No results for input(s): CHOL, HDL, LDLCALC, TRIG, CHOLHDL, LDLDIRECT in the last 72 hours. Thyroid Function Tests:  No results for input(s): TSH, T4TOTAL, FREET4, T3FREE, THYROIDAB in the last 72 hours. Anemia Panel: No results for input(s): VITAMINB12, FOLATE, FERRITIN, TIBC, IRON, RETICCTPCT in the last 72 hours. Sepsis Labs: No results for input(s): PROCALCITON, LATICACIDVEN in the last 168  hours.  Recent Results (from the past 240 hour(s))  GI pathogen panel by PCR, stool     Status: Abnormal   Collection Time: 05/24/19 10:00 PM   Specimen: Stool  Result Value Ref Range Status   Plesiomonas shigelloides NOT DETECTED NOT DETECTED Final   Yersinia enterocolitica NOT DETECTED NOT DETECTED Final   Vibrio NOT DETECTED NOT DETECTED Final   Enteropathogenic E coli NOT DETECTED NOT DETECTED Final   E coli (ETEC) LT/ST NOT DETECTED NOT DETECTED Final   E coli 2423 by PCR Not applicable NOT DETECTED Final   Cryptosporidium by PCR DETECTED (A) NOT DETECTED Final   Entamoeba histolytica NOT DETECTED NOT DETECTED Final   Adenovirus F 40/41 NOT DETECTED NOT DETECTED Final   Norovirus GI/GII NOT DETECTED NOT DETECTED Final   Sapovirus NOT DETECTED NOT DETECTED Final    Comment: (NOTE) Performed At: [facility] [place], [place] [id number] Rush Farmer MD QM:[id number]    Vibrio cholerae NOT DETECTED NOT DETECTED Final   Campylobacter by PCR NOT DETECTED NOT DETECTED Final   Salmonella by PCR NOT DETECTED NOT DETECTED Final   E coli (STEC) NOT DETECTED NOT DETECTED Final   Enteroaggregative E coli NOT DETECTED NOT DETECTED Final   Shigella by PCR NOT DETECTED NOT DETECTED Final   Cyclospora cayetanensis NOT DETECTED NOT DETECTED Final   Astrovirus NOT DETECTED NOT DETECTED Final   G lamblia by PCR NOT DETECTED NOT DETECTED Final   Rotavirus A by PCR NOT DETECTED NOT DETECTED Final  C Difficile Quick Screen w PCR reflex     Status: None   Collection Time: 05/24/19 10:00 PM   Specimen: STOOL  Result Value Ref Range Status   C Diff antigen NEGATIVE NEGATIVE Final   C Diff toxin NEGATIVE NEGATIVE Final   C Diff interpretation No C. difficile detected.  Final    Comment: Performed at [facility], [address] [place], [place] [address]         Radiology Studies: [facility] Chest [place] 1 View  Result Date: 06/03/2019 CLINICAL DATA:   Leukocytosis. EXAM: PORTABLE CHEST 1 VIEW COMPARISON:  CTA chest and chest x-ray dated May 21, 2019. FINDINGS: The heart size and mediastinal contours are within normal limits. Both lungs are clear. The visualized skeletal structures are unremarkable. IMPRESSION: No active disease. Electronically Signed   By: Titus Dubin M.D.   On: 06/03/2019 12:34        Scheduled Meds: . ARIPiprazole  10 mg Oral QHS  . atorvastatin  40 mg Oral Daily  . buPROPion  150 mg Oral q morning - 10a  . enoxaparin (LOVENOX) injection  40 mg Subcutaneous Q24H  . ferrous sulfate  325 mg Oral QODAY  . gabapentin  300 mg Oral QHS  . hydrochlorothiazide  12.5 mg Oral Daily  . insulin aspart  0-15 Units Subcutaneous TID WC  . insulin aspart  3 Units Subcutaneous TID WC  . insulin glargine  60 Units Subcutaneous QAC breakfast  . lisinopril  40 mg Oral Daily  . pantoprazole  40 mg Oral QHS  . vitamin B-12  1,000 mcg Oral Daily   Continuous Infusions: . piperacillin-tazobactam (ZOSYN)  IV  LOS: 11 days    Time spent: 35-40 minutes    Ezekiel Slocumb, DO [facility]   If 7PM-7AM, please contact night-coverage [email or web address] 06/03/2019, 3:15 PM

## 2019-06-04 ENCOUNTER — Ambulatory Visit: Payer: Self-pay | Admitting: Licensed Clinical Social Worker

## 2019-06-04 DIAGNOSIS — M25475 Effusion, left foot: Secondary | ICD-10-CM | POA: Diagnosis not present

## 2019-06-04 DIAGNOSIS — E1149 Type 2 diabetes mellitus with other diabetic neurological complication: Secondary | ICD-10-CM | POA: Diagnosis not present

## 2019-06-04 DIAGNOSIS — R5381 Other malaise: Secondary | ICD-10-CM

## 2019-06-04 DIAGNOSIS — Z79899 Other long term (current) drug therapy: Secondary | ICD-10-CM | POA: Diagnosis not present

## 2019-06-04 DIAGNOSIS — C187 Malignant neoplasm of sigmoid colon: Secondary | ICD-10-CM | POA: Diagnosis not present

## 2019-06-04 DIAGNOSIS — M25474 Effusion, right foot: Secondary | ICD-10-CM | POA: Diagnosis not present

## 2019-06-04 DIAGNOSIS — Z9119 Patient's noncompliance with other medical treatment and regimen: Secondary | ICD-10-CM | POA: Diagnosis not present

## 2019-06-04 DIAGNOSIS — R5383 Other fatigue: Secondary | ICD-10-CM

## 2019-06-04 DIAGNOSIS — Z7982 Long term (current) use of aspirin: Secondary | ICD-10-CM | POA: Diagnosis not present

## 2019-06-04 DIAGNOSIS — F259 Schizoaffective disorder, unspecified: Secondary | ICD-10-CM

## 2019-06-04 DIAGNOSIS — F329 Major depressive disorder, single episode, unspecified: Secondary | ICD-10-CM | POA: Diagnosis not present

## 2019-06-04 DIAGNOSIS — Z9049 Acquired absence of other specified parts of digestive tract: Secondary | ICD-10-CM | POA: Diagnosis not present

## 2019-06-04 DIAGNOSIS — E11610 Type 2 diabetes mellitus with diabetic neuropathic arthropathy: Secondary | ICD-10-CM | POA: Diagnosis not present

## 2019-06-04 DIAGNOSIS — Z7901 Long term (current) use of anticoagulants: Secondary | ICD-10-CM | POA: Diagnosis not present

## 2019-06-04 DIAGNOSIS — I10 Essential (primary) hypertension: Secondary | ICD-10-CM | POA: Diagnosis not present

## 2019-06-04 DIAGNOSIS — R531 Weakness: Secondary | ICD-10-CM | POA: Diagnosis not present

## 2019-06-04 DIAGNOSIS — Z803 Family history of malignant neoplasm of breast: Secondary | ICD-10-CM | POA: Diagnosis not present

## 2019-06-04 DIAGNOSIS — Z87891 Personal history of nicotine dependence: Secondary | ICD-10-CM | POA: Diagnosis not present

## 2019-06-04 DIAGNOSIS — R262 Difficulty in walking, not elsewhere classified: Secondary | ICD-10-CM | POA: Diagnosis not present

## 2019-06-04 DIAGNOSIS — Z48815 Encounter for surgical aftercare following surgery on the digestive system: Secondary | ICD-10-CM | POA: Diagnosis not present

## 2019-06-04 DIAGNOSIS — Z794 Long term (current) use of insulin: Secondary | ICD-10-CM

## 2019-06-04 DIAGNOSIS — G709 Myoneural disorder, unspecified: Secondary | ICD-10-CM | POA: Diagnosis not present

## 2019-06-04 DIAGNOSIS — U071 COVID-19: Secondary | ICD-10-CM | POA: Diagnosis not present

## 2019-06-04 DIAGNOSIS — M6281 Muscle weakness (generalized): Secondary | ICD-10-CM | POA: Diagnosis not present

## 2019-06-04 DIAGNOSIS — E1169 Type 2 diabetes mellitus with other specified complication: Secondary | ICD-10-CM | POA: Diagnosis not present

## 2019-06-04 DIAGNOSIS — E1142 Type 2 diabetes mellitus with diabetic polyneuropathy: Secondary | ICD-10-CM | POA: Diagnosis not present

## 2019-06-04 DIAGNOSIS — Z978 Presence of other specified devices: Secondary | ICD-10-CM | POA: Diagnosis not present

## 2019-06-04 DIAGNOSIS — L03116 Cellulitis of left lower limb: Secondary | ICD-10-CM | POA: Diagnosis not present

## 2019-06-04 DIAGNOSIS — Z9889 Other specified postprocedural states: Secondary | ICD-10-CM | POA: Diagnosis not present

## 2019-06-04 DIAGNOSIS — K922 Gastrointestinal hemorrhage, unspecified: Secondary | ICD-10-CM | POA: Diagnosis not present

## 2019-06-04 DIAGNOSIS — E1165 Type 2 diabetes mellitus with hyperglycemia: Secondary | ICD-10-CM | POA: Diagnosis not present

## 2019-06-04 DIAGNOSIS — M2041 Other hammer toe(s) (acquired), right foot: Secondary | ICD-10-CM | POA: Diagnosis not present

## 2019-06-04 DIAGNOSIS — M14672 Charcot's joint, left ankle and foot: Secondary | ICD-10-CM | POA: Diagnosis not present

## 2019-06-04 DIAGNOSIS — Z7401 Bed confinement status: Secondary | ICD-10-CM | POA: Diagnosis not present

## 2019-06-04 DIAGNOSIS — E785 Hyperlipidemia, unspecified: Secondary | ICD-10-CM | POA: Diagnosis not present

## 2019-06-04 DIAGNOSIS — C189 Malignant neoplasm of colon, unspecified: Secondary | ICD-10-CM | POA: Diagnosis not present

## 2019-06-04 DIAGNOSIS — Z791 Long term (current) use of non-steroidal anti-inflammatories (NSAID): Secondary | ICD-10-CM | POA: Diagnosis not present

## 2019-06-04 DIAGNOSIS — E11621 Type 2 diabetes mellitus with foot ulcer: Secondary | ICD-10-CM | POA: Diagnosis not present

## 2019-06-04 DIAGNOSIS — G9009 Other idiopathic peripheral autonomic neuropathy: Secondary | ICD-10-CM | POA: Diagnosis not present

## 2019-06-04 DIAGNOSIS — K219 Gastro-esophageal reflux disease without esophagitis: Secondary | ICD-10-CM | POA: Diagnosis not present

## 2019-06-04 DIAGNOSIS — G629 Polyneuropathy, unspecified: Secondary | ICD-10-CM | POA: Diagnosis not present

## 2019-06-04 DIAGNOSIS — M2550 Pain in unspecified joint: Secondary | ICD-10-CM | POA: Diagnosis not present

## 2019-06-04 DIAGNOSIS — F209 Schizophrenia, unspecified: Secondary | ICD-10-CM | POA: Diagnosis not present

## 2019-06-04 DIAGNOSIS — E7849 Other hyperlipidemia: Secondary | ICD-10-CM | POA: Diagnosis not present

## 2019-06-04 DIAGNOSIS — D649 Anemia, unspecified: Secondary | ICD-10-CM | POA: Diagnosis not present

## 2019-06-04 DIAGNOSIS — D62 Acute posthemorrhagic anemia: Secondary | ICD-10-CM | POA: Diagnosis not present

## 2019-06-04 DIAGNOSIS — E119 Type 2 diabetes mellitus without complications: Secondary | ICD-10-CM

## 2019-06-04 LAB — CBC WITH DIFFERENTIAL/PLATELET
Abs Immature Granulocytes: 0.09 10*3/uL — ABNORMAL HIGH (ref 0.00–0.07)
Basophils Absolute: 0.1 10*3/uL (ref 0.0–0.1)
Basophils Relative: 1 %
Eosinophils Absolute: 1.1 10*3/uL — ABNORMAL HIGH (ref 0.0–0.5)
Eosinophils Relative: 7 %
HCT: 26.2 % — ABNORMAL LOW (ref 36.0–46.0)
Hemoglobin: 7.9 g/dL — ABNORMAL LOW (ref 12.0–15.0)
Immature Granulocytes: 1 %
Lymphocytes Relative: 21 %
Lymphs Abs: 3.2 10*3/uL (ref 0.7–4.0)
MCH: 23.6 pg — ABNORMAL LOW (ref 26.0–34.0)
MCHC: 30.2 g/dL (ref 30.0–36.0)
MCV: 78.2 fL — ABNORMAL LOW (ref 80.0–100.0)
Monocytes Absolute: 1 10*3/uL (ref 0.1–1.0)
Monocytes Relative: 6 %
Neutro Abs: 9.8 10*3/uL — ABNORMAL HIGH (ref 1.7–7.7)
Neutrophils Relative %: 64 %
Platelets: 391 10*3/uL (ref 150–400)
RBC: 3.35 MIL/uL — ABNORMAL LOW (ref 3.87–5.11)
RDW: 16.9 % — ABNORMAL HIGH (ref 11.5–15.5)
WBC: 15.3 10*3/uL — ABNORMAL HIGH (ref 4.0–10.5)
nRBC: 0 % (ref 0.0–0.2)

## 2019-06-04 LAB — SURGICAL PATHOLOGY

## 2019-06-04 LAB — GLUCOSE, CAPILLARY
Glucose-Capillary: 139 mg/dL — ABNORMAL HIGH (ref 70–99)
Glucose-Capillary: 103 mg/dL — ABNORMAL HIGH (ref 70–99)

## 2019-06-04 LAB — BASIC METABOLIC PANEL
Anion gap: 11 (ref 5–15)
BUN: 26 mg/dL — ABNORMAL HIGH (ref 6–20)
CO2: 25 mmol/L (ref 22–32)
Calcium: 9 mg/dL (ref 8.9–10.3)
Chloride: 95 mmol/L — ABNORMAL LOW (ref 98–111)
Creatinine, Ser: 1.08 mg/dL — ABNORMAL HIGH (ref 0.44–1.00)
GFR calc Af Amer: >60 mL/min (ref >60)
GFR calc non Af Amer: 58 mL/min — ABNORMAL LOW (ref >60)
Glucose, Bld: 154 mg/dL — ABNORMAL HIGH (ref 70–99)
Potassium: 4.8 mmol/L (ref 3.5–5.1)
Sodium: 131 mmol/L — ABNORMAL LOW (ref 135–145)

## 2019-06-04 LAB — SARS CORONAVIRUS 2 (TAT 6-24 HRS): SARS Coronavirus 2: NEGATIVE

## 2019-06-04 LAB — MAGNESIUM: Magnesium: 1.7 mg/dL (ref 1.7–2.4)

## 2019-06-04 MED ORDER — LANTUS SOLOSTAR 100 UNIT/ML ~~LOC~~ SOPN: 60.0000 [IU] | PEN_INJECTOR | Freq: Every day | SUBCUTANEOUS | Status: DC

## 2019-06-04 MED ORDER — GABAPENTIN 300 MG PO CAPS: 300.0000 mg | ORAL_CAPSULE | Freq: Every day | ORAL | Status: DC

## 2019-06-04 MED ORDER — FUROSEMIDE 40 MG PO TABS: 40.0000 mg | ORAL_TABLET | Freq: Every day | ORAL | 11 refills | Status: DC

## 2019-06-04 MED ORDER — IBUPROFEN 400 MG PO TABS: 400.0000 mg | ORAL_TABLET | Freq: Four times a day (QID) | ORAL | 0 refills | Status: DC | PRN

## 2019-06-04 MED ORDER — CYANOCOBALAMIN 1000 MCG PO TABS: 1000.0000 ug | ORAL_TABLET | Freq: Every day | ORAL | Status: DC

## 2019-06-04 MED ORDER — ACETAMINOPHEN 325 MG PO TABS: 650.0000 mg | ORAL_TABLET | Freq: Four times a day (QID) | ORAL | Status: DC | PRN

## 2019-06-04 MED ORDER — LISINOPRIL 40 MG PO TABS: 40.0000 mg | ORAL_TABLET | Freq: Every day | ORAL | Status: DC

## 2019-06-04 MED ORDER — INSULIN ASPART 100 UNIT/ML ~~LOC~~ SOLN: 0.0000 [IU] | Freq: Three times a day (TID) | SUBCUTANEOUS | 11 refills | Status: DC

## 2019-06-04 MED ORDER — FERROUS SULFATE 325 (65 FE) MG PO TABS: 325.0000 mg | ORAL_TABLET | ORAL | 3 refills | Status: DC

## 2019-06-04 MED ORDER — PANTOPRAZOLE SODIUM 40 MG PO TBEC: 40.0000 mg | DELAYED_RELEASE_TABLET | Freq: Every day | ORAL | Status: DC

## 2019-06-04 MED ORDER — TRAMADOL HCL 50 MG PO TABS: 50.0000 mg | ORAL_TABLET | Freq: Four times a day (QID) | ORAL | 0 refills | Status: AC | PRN

## 2019-06-04 NOTE — TOC Progression Note (Signed)
Transition of Care [facility]) - Progression Note    Patient Details  Name: [name] MRN: [id number] Date of Birth: 01-13-1966  Transition of [facility] CM/SW [place], RN Phone Number: 06/04/2019, 10:40 AM  Clinical Narrative:     Received a call from [facility] health requesting additional information, Prior level of Function is independent walking with a RW, they will review and call back with approval information  Expected Discharge Plan: Home/Self Care Barriers to Discharge: Continued Medical Work up  Expected Discharge Plan and Services Expected Discharge Plan: Home/Self Care In-house Referral: Clinical Social Work Discharge Planning Services: NA   Living arrangements for the past 2 months: Single Family Home                                       Social Determinants of Health (SDOH) Interventions    Readmission Risk Interventions Readmission Risk Prevention Plan 05/24/2019 10/21/2018 10/18/2018  Post Dischage Appt - Complete -  Medication Screening - Complete -  Transportation Screening Complete Complete Complete  PCP or Specialist Appt within 5-7 Days - - -  Home Care Screening - - Complete  Medication Review (RN CM) - - Complete  Medication Review (RN Care Manager) Complete - -  PCP or Specialist appointment within 3-5 days of discharge Complete - -  [place] or [place] (No Data) - -  [place] Not Applicable - -  Some recent data might be hidden

## 2019-06-04 NOTE — Chronic Care Management (AMB) (Signed)
  Care Management   Follow Up Note   06/04/2019 Name: [name] MRN: [id number] DOB: [birth year]  Referred by: [name], DO Reason for referral : Care Coordination   [name] is a 54 y.o. year old female who is a primary care patient of [name], DO. The care management team was consulted for assistance with care management and care coordination needs.    Review of patient status, including review of consultants reports, relevant laboratory and other test results, and collaboration with appropriate care team members and the patient's provider was performed as part of comprehensive patient evaluation and provision of chronic care management services.    LCSW received incoming call from [facility] caseworker who reports that patient will be placed into a SNF for long term care placement. LCSW has not seen long term placement confirmation through chart review but will continue to follow case closely.   [name], BSW, MSW, [email or web address] Phone: [phone]

## 2019-06-04 NOTE — Discharge Summary (Signed)
Physician Discharge Summary  [name] JKD:[id number] DOB: February 23, 1966 DOA: 05/23/2019  PCP: [name], DO  Admit date: 05/23/2019 Discharge date: 06/04/2019  Admitted From: Home Disposition:  SNF [place]  Recommendations for Outpatient Follow-up:  1. Follow up with PCP in 1 week 2. Please obtain CBC 3 days, check white count (was a little elevated, but no source of infection) 3. Please check BMP for renal function and electrolytes in 1-2 weeks 4. Please follow up at Oncology clinic for follow up in the [place] on June 10, 2019 at 11:15 AM. 5. Please follow up with General Surgery in clinic in 2 weeks. 6. Patient's HCTZ was stopped and Lasix was started, for both BP and leg swelling. 7. Patient's surgical incision site will need daily dry dressing changes until healed or surgery follow up. 8. Follow up with Podiatry in clinic in 1 month.   9. Non-weight bearing on left foot, and use protective boot.  Home Health: No  Equipment/Devices: None   Discharge Condition: Stable  CODE STATUS: Full  Diet recommendation: Heart Healthy / Carb Modified  Brief/Interim Summary:  Gabriela Elsbernd Lassiteris an 54 y.o.femalewith medical history significant ofdiabetes mellitus type 2, hypertension, hyperlipidemia, Charcot foot, schizoaffective disorder,presented to the ED on 05/22/19,accompanied by daughter because of concern for possible sepsisandright lower leg cellulitis. Patient was diagnosed with cellulitis few dayspriorandhad beenstarted on Keflex. Patient had alsohad recent EDvisits for both cellulitis as well as concern about patient's ability to care for self. In the ED, duplex ultrasound negative for DVT, left lower extremity cellulitis confirmed. Hemoglobin 8 from 10 previously, with positive heme occult stool. Admitted to hospitalist service with GI consulted. EGD and colonoscopy were performed on 05/26/19. EGD showed irregular z-line, few  gastric polyps. Colonoscopy showed a likely malignant obstructing tumor in the mid-sigmoid colon. General surgery consulted and patient underwent sigmoid colectomy on 05/28/19.Since surgery, diet has been advanced and patient tolerating well.  Post-op pain resolved.  JP drain removed on 2/1.  Incision has had some drainage which, per surgeon, is to be expected and should be managed with daily dry dressing changes.  Surgical pathology returned on 2/2, and [name], oncologist, reviewed results with patient - Stage I adenocarcinoma of the colon, T2, N0, M0 lesion, and does not require adjuvant chemotherapy.  No further interventions are needed.  Patient had increase in WBC before discharge, but evaluation did not reveal any source of infection (chest xray and urinalysis negative, blood cultures no growth do date and patient afebrile).  Her abdomen is non-tender and incision site without signs of infection.  Lower extremity cellulitis resolved.  No diarrhea.  CBC should be rechecked in a few days.  Suspect the leukocytosis is reactive in nature as patient is asymptomatic.  Patient is clinically improved and stable for discharge to SNF for rehab today.  Charcot foot - Podiatry saw patient while in hospital.  Recommends non-weight-bearing on left foot, to wear protective boot. Follow up in clinic in 1 month.   Discharge Diagnoses: Active Problems:   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HCC)   DM (diabetes mellitus), type 2, 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[place])   Essential hypertension   Hyponatremia   Polyneuropathy   Schizoaffective disorder (HCC)   Hammer toe of second toe of right foot   Hyperlipidemia associated with type 2 diabetes mellitus ([place])   Neuropathic foot ulcer, left, with fat layer exposed ([place])   Cellulitis in diabetic foot (HCC)   GI bleed   Cellulitis  Discharge Instructions   Discharge Instructions    Diet - low sodium heart healthy   Complete by: As directed     Discharge instructions   Complete by: As directed    -- Daily dry dressing changes at surgical incision site. -- Follow up with surgery and oncology as previously scheduled. -- Please have labs (CBC) rechecked in 3 days (check white count).   Increase activity slowly   Complete by: As directed      Allergies as of 06/04/2019   No Known Allergies     Medication List    STOP taking these medications   cephALEXin 500 MG capsule Commonly known as: KEFLEX   Eliquis 2.5 MG Tabs tablet Generic drug: apixaban   HumaLOG KwikPen 100 UNIT/ML KwikPen Generic drug: insulin lispro   hydrochlorothiazide 12.5 MG tablet Commonly known as: HYDRODIURIL     TAKE these medications   acetaminophen 325 MG tablet Commonly known as: TYLENOL Take 2 tablets (650 mg total) by mouth every 6 (six) hours as needed for mild pain.   ARIPiprazole 10 MG tablet Commonly known as: ABILIFY Take 10 mg by mouth at bedtime.   ascorbic acid 500 MG tablet Commonly known as: VITAMIN C Take 500 mg by mouth daily.   aspirin EC 81 MG tablet Take 81 mg by mouth 3 (three) times a week.   atorvastatin 40 MG tablet Commonly known as: LIPITOR Take 1 tablet (40 mg total) by mouth daily.   buPROPion 150 MG 24 hr tablet Commonly known as: WELLBUTRIN XL Take 150 mg by mouth every morning.   cyanocobalamin 1000 MCG tablet Take 1 tablet (1,000 mcg total) by mouth daily. Start taking on: June 05, 2019   ferrous sulfate 325 (65 FE) MG tablet Take 1 tablet (325 mg total) by mouth every other day. Start taking on: June 05, 2019   furosemide 40 MG tablet Commonly known as: Lasix Take 1 tablet (40 mg total) by mouth daily.   gabapentin 300 MG capsule Commonly known as: NEURONTIN Take 1 capsule (300 mg total) by mouth at bedtime.   ibuprofen 400 MG tablet Commonly known as: ADVIL Take 1 tablet (400 mg total) by mouth every 6 (six) hours as needed for mild pain (not relieved by tylenol).   insulin  aspart 100 UNIT/ML injection Commonly known as: novoLOG Inject 0-15 Units into the skin 3 (three) times daily with meals.   Lantus SoloStar 100 UNIT/ML Solostar Pen Generic drug: Insulin Glargine Inject 60 Units into the skin daily before breakfast.   lisinopril 40 MG tablet Commonly known as: ZESTRIL Take 1 tablet (40 mg total) by mouth daily. Start taking on: June 05, 2019 What changed:   medication strength  how much to take   metFORMIN 1000 MG tablet Commonly known as: GLUCOPHAGE Take 1 tablet (1,000 mg total) by mouth 2 (two) times daily with a meal.   ondansetron 4 MG tablet Commonly known as: ZOFRAN Take 1 tablet (4 mg total) by mouth every 6 (six) hours. What changed:   when to take this  reasons to take this   onetouch ultrasoft lancets Use to check blood sugar up to 3 x daily   OneTouch Verio test strip Generic drug: glucose blood Check blood sugar up to 5 x daily as needed   pantoprazole 40 MG tablet Commonly known as: PROTONIX Take 1 tablet (40 mg total) by mouth at bedtime.   traMADol 50 MG tablet Commonly known as: ULTRAM Take 1 tablet (50 mg total)  by mouth every 6 (six) hours as needed for up to 5 days for moderate pain.       Contact information for follow-up providers    [name], DO Follow up.   Specialty: Family Medicine Contact information: [place] [address] [phone]        Lloyd Huger, MD. Go to.   Specialty: Oncology Why: Monday Feburary 8th 2021 at 11:15 am Contact information: [place] [address] [phone]        Caroline More, DPM. Schedule an appointment as soon as possible for a visit in 1 month(s).   Specialty: Podiatry Contact information: [place] [address] [phone]            Contact information for after-discharge care    [facility] .   Service: Skilled  Nursing Contact information: [place] [phone]                 No Known Allergies  Consultations:  Oncology  General Surgery  Gastroenterology  Podiatry   Procedures/Studies: CT Angio Chest PE W and/or Wo Contrast  Result Date: 05/21/2019 CLINICAL DATA:  Shortness of breath. Positive D-dimer. Weakness. Tachycardia. Headache. COVID-19 negative earlier today. EXAM: CT ANGIOGRAPHY CHEST WITH CONTRAST TECHNIQUE: Multidetector CT imaging of the chest was performed using the standard protocol during bolus administration of intravenous contrast. Multiplanar CT image reconstructions and MIPs were obtained to evaluate the vascular anatomy. CONTRAST:  75mL OMNIPAQUE IOHEXOL 350 MG/ML SOLN COMPARISON:  Chest radiograph earlier today.  CTA chest 04/12/2019 FINDINGS: Cardiovascular: The quality of this exam for evaluation of pulmonary embolism is moderate. The bolus is centered in the SVC. No pulmonary embolism to the large segmental level. Aortic atherosclerosis. No dissection. Mild cardiomegaly, without pericardial effusion. Proximal LAD and possible left main coronary artery atherosclerosis, including on 47/4. Mediastinum/Nodes: No mediastinal or hilar adenopathy. Lungs/Pleura: No pleural fluid.  Clear lungs. Upper Abdomen: Cholecystectomy. Normal imaged portions of the liver, spleen, stomach, pancreas, kidneys, right adrenal gland. Left adrenal nodule of 1.4 cm, present back to 08/19/2012 abdominal CT. Musculoskeletal: Midthoracic spondylosis. Mild right hemidiaphragm elevation. Review of the MIP images confirms the above findings. IMPRESSION: 1. Moderate quality evaluation for pulmonary embolism. No pulmonary embolism to the large segmental level. 2. Age advanced coronary artery atherosclerosis. Recommend assessment of coronary risk factors and consideration of medical therapy. 3. Left adrenal nodule, similar to 08/19/2012 and therefore presumably an  adenoma. Electronically Signed   By: Abigail Miyamoto M.D.   On: 05/21/2019 20:09   CT ABDOMEN PELVIS W CONTRAST  Result Date: 05/26/2019 CLINICAL DATA:  Colorectal cancer staging, sigmoid mass on colonoscopy today RIGHT lower quadrant pain and loss of appetite for 2 weeks EXAM: CT ABDOMEN AND PELVIS WITH CONTRAST TECHNIQUE: Multidetector CT imaging of the abdomen and pelvis was performed using the standard protocol following bolus administration of intravenous contrast. Sagittal and coronal MPR images reconstructed from axial data set. CONTRAST:  161mL OMNIPAQUE IOHEXOL 300 MG/ML SOLN IV. No oral contrast was administered COMPARISON:  CT angio chest 05/21/2019, CT abdomen and pelvis 08/19/2012 FINDINGS: Lower chest: Minimal subsegmental atelectasis LEFT lower lobe Hepatobiliary: Gallbladder surgically absent. Liver normal appearance. No definite hepatic mass lesion or nodularity. Pancreas: Normal appearance Spleen: Normal appearance Adrenals/Urinary Tract: LEFT adrenal nodule 18 x 13 mm image 29 previously 18 x 11 mm. RIGHT adrenal gland unremarkable. Kidneys, ureters, and bladder  normal appearance Stomach/Bowel: Normal appendix. Slight thickening of the sigmoid colon approximately 3 cm transverse over approximately 4 cm length question neoplasm identified at colonoscopy. Minimal adjacent edema, nonspecific, could potentially be related to preceding endoscopic procedure. No other focal colonic abnormalities seen. Stomach and small bowel loops normal appearance. Vascular/Lymphatic: Mild atherosclerotic calcifications aorta without aneurysm. No adenopathy. Reproductive: Normal appearance of uterus and LEFT ovary. Fat containing mass in RIGHT adnexa consistent with dermoid tumor, 4.2 x 3.1 x 3.5 cm image 86. Other: No free air or free fluid. No hernia or inflammatory process. Musculoskeletal: Unremarkable IMPRESSION: Thickened segment of sigmoid colon approximately 3 cm diameter by 4 cm length question neoplasm  identified by colonoscopy. Nonspecific mild infiltration of pericolic fat adjacent to the thickened sigmoid segment, nonspecific in the setting of preceding endoscopy. 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 Stable LEFT adrenal nodule since 2014 favors a benign etiology. No definite evidence of metastatic disease. Aortic Atherosclerosis (ICD10-I70.0). Electronically Signed   By: Lavonia Dana M.D.   On: 05/26/2019 17:53   US Venous Img Lower Unilateral Right (DVT)  Result Date: 05/23/2019 CLINICAL DATA:  Leg swelling.  Pain. EXAM: RIGHT LOWER EXTREMITY VENOUS DOPPLER ULTRASOUND TECHNIQUE: Gray-scale sonography with graded compression, as well as color Doppler and duplex ultrasound were performed to evaluate the lower extremity deep venous systems from the level of the common femoral vein and including the common femoral, femoral, profunda femoral, popliteal and calf veins including the posterior tibial, peroneal and gastrocnemius veins when visible. The superficial great saphenous vein was also interrogated. Spectral Doppler was utilized to evaluate flow at rest and with distal augmentation maneuvers in the common femoral, femoral and popliteal veins. COMPARISON:  None. FINDINGS: Contralateral Common Femoral Vein: Respiratory phasicity is normal and symmetric with the symptomatic side. No evidence of thrombus. Normal compressibility. Common Femoral Vein: No evidence of thrombus. Normal compressibility, respiratory phasicity and response to augmentation. Saphenofemoral Junction: No evidence of thrombus. Normal compressibility and flow on color Doppler imaging. Profunda Femoral Vein: No evidence of thrombus. Normal compressibility and flow on color Doppler imaging. Femoral Vein: No evidence of thrombus. Normal compressibility, respiratory phasicity and response to augmentation. Popliteal Vein: No evidence of thrombus. Normal compressibility, respiratory phasicity and response to augmentation. Calf Veins:  Evaluation of the peroneal vein was limited by suboptimal compression. There is no obvious thrombus involving the tibial veins. Superficial Great Saphenous Vein: No evidence of thrombus. Normal compressibility. Venous Reflux:  None. Other Findings:  None. IMPRESSION: No evidence of deep venous thrombosis. Electronically Signed   By: Constance Holster M.D.   On: 05/23/2019 21:05   DG Chest Port 1 View  Result Date: 06/03/2019 CLINICAL DATA:  Leukocytosis. EXAM: PORTABLE CHEST 1 VIEW COMPARISON:  CTA chest and chest x-ray dated May 21, 2019. FINDINGS: The heart size and mediastinal contours are within normal limits. Both lungs are clear. The visualized skeletal structures are unremarkable. IMPRESSION: No active disease. Electronically Signed   By: Titus Dubin M.D.   On: 06/03/2019 12:34   DG Chest Portable 1 View  Result Date: 05/21/2019 CLINICAL DATA:  Cough and fever EXAM: PORTABLE CHEST 1 VIEW COMPARISON:  May 21, 2019 FINDINGS: There is mild elevation of the right hemidiaphragm. Lungs are clear. Heart size and pulmonary vascularity are normal. No adenopathy. No bone lesions. IMPRESSION: Mild elevation of right hemidiaphragm. No edema or consolidation. Cardiac silhouette normal. No adenopathy. Electronically Signed   By: Lowella Grip III M.D.   On: 05/21/2019 16:10  DG Foot Complete Left  Result Date: 05/31/2019 CLINICAL DATA:  Diabetes, Charcot arthropathy EXAM: LEFT FOOT - COMPLETE 3+ VIEW COMPARISON:  04/27/2019, 12/13/2018 FINDINGS: Redemonstration of neuropathic changes of the midfoot with malalignment including fragmentation and medial dislocation of the navicular, dorsal subluxation of the cuneiforms relative to the hindfoot. There is a similar degree of fragmentation within the midfoot. Tibiotalar and tarsometatarsal joint alignment is maintained. There is diffuse heterogeneity of the osseous structures likely reflecting demineralization. No new fractures are seen. There is  diffuse soft tissue prominence and vascular calcifications. IMPRESSION: Findings of Charcot arthropathy of the left midfoot without significant interval progression compared to previous study 04/27/2019. Electronically Signed   By: Davina Poke D.O.   On: 05/31/2019 10:40     Procedures:  EGD, colonoscopy 05/26/2019  Sigmoid colectomy 05/28/2019  JP drain removal 06/03/2019   Subjective: Patient seen this AM, sitting up edge of bed doing her bills.  She denies fever/chills, n/v/d, chest pain, sob, coughing, headache, abdominal pain.  Incision site had drainage over night and this AM.  No tenderness at the site.  No acute events reported.  Says she is ready to get going with rehab.  Surgical path came back overnight, informed patient oncology would come to discuss with her.   Discharge Exam: Vitals:   06/03/19 1639 06/03/19 2321  BP: (!) 141/87 121/68  Pulse: 95 97  Resp: 17 11  Temp: 98.4 F (36.9 C) 97.8 F (36.6 C)  SpO2: 98% 94%   Vitals:   06/03/19 0752 06/03/19 1101 06/03/19 1639 06/03/19 2321  BP: (!) 156/80 (!) 189/96 (!) 141/87 121/68  Pulse: 93 67 95 97  Resp: 17  17 11   Temp: 98.1 F (36.7 C)  98.4 F (36.9 C) 97.8 F (36.6 C)  TempSrc: Oral  Oral Oral  SpO2: 100% 97% 98% 94%  Weight:      Height:        General: Pt is alert, awake, not in acute distress, obese Cardiovascular: RRR, S1/S2 +, no rubs, no gallops Respiratory: CTA bilaterally, no wheezing, no rhonchi Abdominal: Soft, NT, ND, bowel sounds +, incision site with honeycomb dressing and gauze underlying with minimal non-purulent non-bloody fluid seen. Extremities: L>R lower extremity edema, no cyanosis    The results of significant diagnostics from this hospitalization (including imaging, microbiology, ancillary and laboratory) are listed below for reference.     Microbiology: Recent Results (from the past 240 hour(s))  CULTURE, BLOOD (ROUTINE X 2) w Reflex to ID Panel     Status: None  (Preliminary result)   Collection Time: 06/03/19 12:28 PM   Specimen: BLOOD  Result Value Ref Range Status   Specimen Description BLOOD RIGHT ANTECUBITAL  Final   Special Requests   Final    BOTTLES DRAWN AEROBIC AND ANAEROBIC Blood Culture adequate volume   Culture   Final    NO GROWTH < 24 HOURS Performed at [facility], [place], [place] [address]    Report Status PENDING  Incomplete  SARS CORONAVIRUS 2 (TAT 6-24 HRS) Nasopharyngeal Nasopharyngeal Swab     Status: None   Collection Time: 06/03/19  4:13 PM   Specimen: Nasopharyngeal Swab  Result Value Ref Range Status   SARS Coronavirus 2 NEGATIVE NEGATIVE Final    Comment: (NOTE) SARS-CoV-2 target nucleic acids are NOT DETECTED. The SARS-CoV-2 RNA is generally detectable in upper and lower respiratory specimens during the acute phase of infection. Negative results do not preclude SARS-CoV-2 infection, do not  rule out co-infections with other pathogens, and should not be used as the sole basis for treatment or other patient management decisions. Negative results must be combined with clinical observations, patient history, and epidemiological information. The expected result is Negative. Fact Sheet for Patients: SugarRoll.be Fact Sheet for Healthcare Providers: https://www.woods-mathews.com/ This test is not yet approved or cleared by the [place] FDA and  has been authorized for detection and/or diagnosis of SARS-CoV-2 by FDA under an Emergency Use Authorization (EUA). This EUA will remain  in effect (meaning this test can be used) for the duration of the COVID-19 declaration under Section 56 4(b)(1) of the Act, 21 U.S.C. section 360bbb-3(b)(1), unless the authorization is terminated or revoked sooner. Performed at [facility], [place] [address] [place], [place] [address]      Labs: BNP (last 3 results) Recent Labs    10/18/18 0024  BNP 51.7    Basic Metabolic Panel: Recent Labs  Lab 05/29/19 0334 05/30/19 0639 05/31/19 0448 06/01/19 0537 06/04/19 0426  NA 137 137 135 138 131*  K 4.3 4.7 4.7 4.5 4.8  CL 106 105 104 104 95*  CO2 20* 23 24 26 25   GLUCOSE 115* 141* 188* 181* 154*  BUN 10 10 10 11  26*  CREATININE 1.17* 0.99 0.91 0.91 1.08*  CALCIUM 8.6* 9.3 9.1 9.1 9.0  MG  --  1.7 1.6* 1.9 1.7   Liver Function Tests: No results for input(s): AST, ALT, ALKPHOS, BILITOT, PROT, ALBUMIN in the last 168 hours. No results for input(s): LIPASE, AMYLASE in the last 168 hours. No results for input(s): AMMONIA in the last 168 hours. CBC: Recent Labs  Lab 05/31/19 0448 06/01/19 0537 06/02/19 0435 06/03/19 0446 06/04/19 0426  WBC 10.7* 10.1 12.2* 14.9* 15.3*  NEUTROABS  --   --   --  9.9* 9.8*  HGB 7.9* 7.6* 8.4* 8.4* 7.9*  HCT 26.2* 25.0* 27.6* 27.5* 26.2*  MCV 80.1 79.9* 79.1* 78.6* 78.2*  PLT 395 352 410* 425* 391   Cardiac Enzymes: No results for input(s): CKTOTAL, CKMB, CKMBINDEX, TROPONINI in the last 168 hours. BNP: Invalid input(s): POCBNP CBG: Recent Labs  Lab 06/03/19 1202 06/03/19 1638 06/03/19 2028 06/04/19 0816 06/04/19 1143  GLUCAP 171* 155* 155* 139* 103*   D-Dimer No results for input(s): DDIMER in the last 72 hours. Hgb A1c No results for input(s): HGBA1C in the last 72 hours. Lipid Profile No results for input(s): CHOL, HDL, LDLCALC, TRIG, CHOLHDL, LDLDIRECT in the last 72 hours. Thyroid function studies No results for input(s): TSH, T4TOTAL, T3FREE, THYROIDAB in the last 72 hours.  Invalid input(s): FREET3 Anemia work up No results for input(s): VITAMINB12, FOLATE, FERRITIN, TIBC, IRON, RETICCTPCT in the last 72 hours. Urinalysis    Component Value Date/Time   COLORURINE STRAW (A) 06/03/2019 1532   APPEARANCEUR CLEAR (A) 06/03/2019 1532   APPEARANCEUR Clear 08/19/2012 1204   LABSPEC 1.008 06/03/2019 1532   LABSPEC 1.015 08/19/2012 1204   PHURINE 6.0 06/03/2019 1532   GLUCOSEU  NEGATIVE 06/03/2019 1532   GLUCOSEU >=500 08/19/2012 1204   HGBUR NEGATIVE 06/03/2019 1532   BILIRUBINUR NEGATIVE 06/03/2019 1532   BILIRUBINUR Negative 12/13/2017 1533   BILIRUBINUR Negative 08/19/2012 1204   KETONESUR NEGATIVE 06/03/2019 1532   PROTEINUR NEGATIVE 06/03/2019 1532   UROBILINOGEN 0.2 12/13/2017 1533   NITRITE NEGATIVE 06/03/2019 1532   LEUKOCYTESUR TRACE (A) 06/03/2019 1532   LEUKOCYTESUR Trace 08/19/2012 1204   Sepsis Labs Invalid input(s): PROCALCITONIN,  WBC,  LACTICIDVEN Microbiology Recent Results (  from the past 240 hour(s))  CULTURE, BLOOD (ROUTINE X 2) w Reflex to ID Panel     Status: None (Preliminary result)   Collection Time: 06/03/19 12:28 PM   Specimen: BLOOD  Result Value Ref Range Status   Specimen Description BLOOD RIGHT ANTECUBITAL  Final   Special Requests   Final    BOTTLES DRAWN AEROBIC AND ANAEROBIC Blood Culture adequate volume   Culture   Final    NO GROWTH < 24 HOURS Performed at [facility], [place] [place], [place] [address]    Report Status PENDING  Incomplete  SARS CORONAVIRUS 2 (TAT 6-24 HRS) Nasopharyngeal Nasopharyngeal Swab     Status: None   Collection Time: 06/03/19  4:13 PM   Specimen: Nasopharyngeal Swab  Result Value Ref Range Status   SARS Coronavirus 2 NEGATIVE NEGATIVE Final    Comment: (NOTE) SARS-CoV-2 target nucleic acids are NOT DETECTED. The SARS-CoV-2 RNA is generally detectable in upper and lower respiratory specimens during the acute phase of infection. Negative results do not preclude SARS-CoV-2 infection, do not rule out co-infections with other pathogens, and should not be used as the sole basis for treatment or other patient management decisions. Negative results must be combined with clinical observations, patient history, and epidemiological information. The expected result is Negative. Fact Sheet for Patients: SugarRoll.be Fact Sheet for Healthcare  Providers: https://www.woods-mathews.com/ This test is not yet approved or cleared by the [place] FDA and  has been authorized for detection and/or diagnosis of SARS-CoV-2 by FDA under an Emergency Use Authorization (EUA). This EUA will remain  in effect (meaning this test can be used) for the duration of the COVID-19 declaration under Section 56 4(b)(1) of the Act, 21 U.S.C. section 360bbb-3(b)(1), unless the authorization is terminated or revoked sooner. Performed at [facility], [place] [address], [place], [place] [address]      Time coordinating discharge: Over 30 minutes  SIGNED:   Ezekiel Slocumb, DO [facility] 06/04/2019, 1:39 PM   If 7PM-7AM, please contact night-coverage [email or web address]

## 2019-06-04 NOTE — Progress Notes (Signed)
Patient discharged to [facility] via EMS in stable condition.

## 2019-06-04 NOTE — Progress Notes (Signed)
[place]  [phone] [phone]  ID: [name] OB: 1966/04/01  MR#: [id number]  JGO#:[id number]  Patient Care Team: [name], DO as PCP - General (Family Medicine) Floria Raveling, MD as Referring Physician (Psychiatry) Dhalla, Virl Diamond, [facility] as Pharmacist (Pharmacist) Edrick Kins, MD as Rounding Team (Internal Medicine) Vanita Ingles, RN as Case Manager ([place]) Greg Cutter, LCSW as Social Worker (Licensed Clinical Social Worker)  CHIEF COMPLAINT: Stage I adenocarcinoma of the colon.  INTERVAL HISTORY: Final pathology reviewed independently confirming stage of disease.  Patient will not require adjuvant chemotherapy.  Patient feels nearly recovered from her surgery and states she is ready for discharge.  She offers no specific complaints today.  REVIEW OF SYSTEMS:   Review of Systems  Constitutional: Positive for malaise/fatigue. Negative for fever and weight loss.  Respiratory: Negative.  Negative for cough, hemoptysis and shortness of breath.   Cardiovascular: Negative.  Negative for chest pain and leg swelling.  Gastrointestinal: Negative.  Negative for abdominal pain, blood in stool and melena.  Genitourinary: Negative.  Negative for dysuria.  Musculoskeletal: Negative.  Negative for back pain.  Skin: Negative.   Neurological: Positive for weakness. Negative for dizziness and focal weakness.  Psychiatric/Behavioral: Negative.  The patient is not nervous/anxious.     As per HPI. Otherwise, a complete review of systems is negative.  PAST MEDICAL HISTORY: Past Medical History:  Diagnosis Date  . Depression   . Diabetes mellitus without complication ([place])   . Hyperlipidemia   . Hypertension   . Neuromuscular disorder ([place])   . Schizo affective schizophrenia ([place])    abilify and pazil    PAST SURGICAL HISTORY: Past Surgical History:  Procedure Laterality Date  .  CHOLECYSTECTOMY    . COLONOSCOPY WITH PROPOFOL N/A 05/26/2019   Procedure: COLONOSCOPY WITH PROPOFOL;  Surgeon: Toledo, Benay Pike, MD;  Location: [facility] ENDOSCOPY;  Service: Gastroenterology;  Laterality: N/A;  . ESOPHAGOGASTRODUODENOSCOPY (EGD) WITH PROPOFOL N/A 05/26/2019   Procedure: ESOPHAGOGASTRODUODENOSCOPY (EGD) WITH PROPOFOL;  Surgeon: Toledo, Benay Pike, MD;  Location: [facility] ENDOSCOPY;  Service: Gastroenterology;  Laterality: N/A;  . IRRIGATION AND DEBRIDEMENT FOOT Right 02/26/2016   Procedure: RIGHT 2ND TOE DEBRIDEMENT;  Surgeon: Samara Deist, DPM;  Location: [facility] ORS;  Service: Podiatry;  Laterality: Right;  . IRRIGATION AND DEBRIDEMENT FOOT Left 12/13/2018   Procedure: IRRIGATION AND DEBRIDEMENT FOOT;  Surgeon: Caroline More, DPM;  Location: [facility] ORS;  Service: Podiatry;  Laterality: Left;    FAMILY HISTORY: Family History  Problem Relation Age of Onset  . Breast cancer Cousin        maternal cousin  . Hypertension Mother   . Hyperthyroidism Mother   . Diabetes Maternal Grandmother   . Hypertension Maternal Grandmother   . Cancer Maternal Grandmother        unknown type  . Diabetes Maternal Grandfather   . Hypertension Maternal Grandfather   . Diabetes Paternal Grandmother   . Hypertension Paternal Grandmother   . Diabetes Paternal Grandfather   . Hypertension Paternal Grandfather     ADVANCED DIRECTIVES (Y/N):  @ADVDIR @  HEALTH MAINTENANCE: Social History   Tobacco Use  . Smoking status: Former Smoker    Packs/day: 3.00    Years: 30.00    Pack years: 90.00    Types: Cigarettes    Quit date: 05/02/2009    Years since quitting: 10.0  . Smokeless tobacco: Former Systems developer  . Tobacco comment: Pt reported  quitting in 2011  Substance Use Topics  .  Alcohol use: No  . Drug use: No     Colonoscopy:  PAP:  Bone density:  Lipid panel:  No Known Allergies  Current Facility-Administered Medications  Medication Dose Route Frequency Provider Last Rate Last Admin  .  acetaminophen (TYLENOL) tablet 650 mg  650 mg Oral Q6H PRN Sakai, Isami, DO   650 mg at 06/04/19 0759  . ARIPiprazole (ABILIFY) tablet 10 mg  10 mg Oral QHS Cristescu, Linard Millers, MD   10 mg at 06/03/19 2129  . atorvastatin (LIPITOR) tablet 40 mg  40 mg Oral Daily Cristescu, Linard Millers, MD   40 mg at 06/04/19 0946  . buPROPion (WELLBUTRIN XL) 24 hr tablet 150 mg  150 mg Oral q morning - 10a Cristescu, Linard Millers, MD   150 mg at 06/04/19 0946  . enoxaparin (LOVENOX) injection 40 mg  40 mg Subcutaneous Q24H Nicole Kindred A, DO   40 mg at 06/03/19 2128  . ferrous sulfate tablet 325 mg  325 mg Oral Tonye Pearson, MD   325 mg at 06/03/19 0834  . furosemide (LASIX) tablet 40 mg  40 mg Oral Daily Nicole Kindred A, DO   40 mg at 06/04/19 0946  . gabapentin (NEURONTIN) capsule 300 mg  300 mg Oral QHS Jennye Boroughs, MD   300 mg at 06/03/19 2129  . HYDROcodone-acetaminophen (NORCO/VICODIN) 5-325 MG per tablet 1 tablet  1 tablet Oral Q6H PRN Lysle Pearl, Isami, DO      . ibuprofen (ADVIL) tablet 400 mg  400 mg Oral Q6H PRN Sakai, Isami, DO      . insulin aspart (novoLOG) injection 0-15 Units  0-15 Units Subcutaneous TID WC Cristescu, Linard Millers, MD   2 Units at 06/04/19 0847  . insulin aspart (novoLOG) injection 3 Units  3 Units Subcutaneous TID WC Nicole Kindred A, DO   3 Units at 06/04/19 1154  . insulin glargine (LANTUS) injection 60 Units  60 Units Subcutaneous QAC breakfast Sakai, Isami, DO   60 Units at 06/04/19 0831  . lisinopril (ZESTRIL) tablet 40 mg  40 mg Oral Daily Nicole Kindred A, DO   40 mg at 06/04/19 0945  . ondansetron (ZOFRAN) tablet 4 mg  4 mg Oral Q6H PRN Cristescu, Mircea G, MD   4 mg at 06/04/19 0830   Or  . ondansetron (ZOFRAN) injection 4 mg  4 mg Intravenous Q6H PRN Cristescu, Linard Millers, MD   4 mg at 05/30/19 0540  . pantoprazole (PROTONIX) EC tablet 40 mg  40 mg Oral QHS Dallie Piles, RPH   40 mg at 06/03/19 2129  . traMADol (ULTRAM) tablet 50 mg  50 mg Oral Q6H PRN Sakai, Isami, DO       . vitamin B-12 (CYANOCOBALAMIN) tablet 1,000 mcg  1,000 mcg Oral Daily Jennye Boroughs, MD   1,000 mcg at 06/04/19 0946    OBJECTIVE: Vitals:   06/03/19 1639 06/03/19 2321  BP: (!) 141/87 121/68  Pulse: 95 97  Resp: 17 11  Temp: 98.4 F (36.9 C) 97.8 F (36.6 C)  SpO2: 98% 94%     Body mass index is 38.02 kg/m.    ECOG FS:0 - Asymptomatic  General: Well-developed, well-nourished, no acute distress. Eyes: Pink conjunctiva, anicteric sclera. HEENT: Normocephalic, moist mucous membranes. Lungs: No audible wheezing or coughing. Heart: Regular rate and rhythm. Abdomen: Soft, nontender, no obvious distention. Musculoskeletal: No edema, cyanosis, or clubbing. Neuro: Alert, answering all questions appropriately. Cranial nerves grossly intact. Skin: No rashes or petechiae noted. Psych: Normal  affect.   LAB RESULTS:  Lab Results  Component Value Date   NA 131 (L) 06/04/2019   K 4.8 06/04/2019   CL 95 (L) 06/04/2019   CO2 25 06/04/2019   GLUCOSE 154 (H) 06/04/2019   BUN 26 (H) 06/04/2019   CREATININE 1.08 (H) 06/04/2019   CALCIUM 9.0 06/04/2019   PROT 6.6 05/22/2019   ALBUMIN 3.2 (L) 05/22/2019   AST 25 05/22/2019   ALT 34 05/22/2019   ALKPHOS 102 05/22/2019   BILITOT 0.5 05/22/2019   GFRNONAA 58 (L) 06/04/2019   GFRAA >60 06/04/2019    Lab Results  Component Value Date   WBC 15.3 (H) 06/04/2019   NEUTROABS 9.8 (H) 06/04/2019   HGB 7.9 (L) 06/04/2019   HCT 26.2 (L) 06/04/2019   MCV 78.2 (L) 06/04/2019   PLT 391 06/04/2019     STUDIES: CT Angio Chest PE W and/or Wo Contrast  Result Date: 05/21/2019 CLINICAL DATA:  Shortness of breath. Positive D-dimer. Weakness. Tachycardia. Headache. COVID-19 negative earlier today. EXAM: CT ANGIOGRAPHY CHEST WITH CONTRAST TECHNIQUE: Multidetector CT imaging of the chest was performed using the standard protocol during bolus administration of intravenous contrast. Multiplanar CT image reconstructions and MIPs were obtained to  evaluate the vascular anatomy. CONTRAST:  15mL OMNIPAQUE IOHEXOL 350 MG/ML SOLN COMPARISON:  Chest radiograph earlier today.  CTA chest 04/12/2019 FINDINGS: Cardiovascular: The quality of this exam for evaluation of pulmonary embolism is moderate. The bolus is centered in the SVC. No pulmonary embolism to the large segmental level. Aortic atherosclerosis. No dissection. Mild cardiomegaly, without pericardial effusion. Proximal LAD and possible left main coronary artery atherosclerosis, including on 47/4. Mediastinum/Nodes: No mediastinal or hilar adenopathy. Lungs/Pleura: No pleural fluid.  Clear lungs. Upper Abdomen: Cholecystectomy. Normal imaged portions of the liver, spleen, stomach, pancreas, kidneys, right adrenal gland. Left adrenal nodule of 1.4 cm, present back to 08/19/2012 abdominal CT. Musculoskeletal: Midthoracic spondylosis. Mild right hemidiaphragm elevation. Review of the MIP images confirms the above findings. IMPRESSION: 1. Moderate quality evaluation for pulmonary embolism. No pulmonary embolism to the large segmental level. 2. Age advanced coronary artery atherosclerosis. Recommend assessment of coronary risk factors and consideration of medical therapy. 3. Left adrenal nodule, similar to 08/19/2012 and therefore presumably an adenoma. Electronically Signed   By: Abigail Miyamoto M.D.   On: 05/21/2019 20:09   CT ABDOMEN PELVIS W CONTRAST  Result Date: 05/26/2019 CLINICAL DATA:  Colorectal cancer staging, sigmoid mass on colonoscopy today RIGHT lower quadrant pain and loss of appetite for 2 weeks EXAM: CT ABDOMEN AND PELVIS WITH CONTRAST TECHNIQUE: Multidetector CT imaging of the abdomen and pelvis was performed using the standard protocol following bolus administration of intravenous contrast. Sagittal and coronal MPR images reconstructed from axial data set. CONTRAST:  129mL OMNIPAQUE IOHEXOL 300 MG/ML SOLN IV. No oral contrast was administered COMPARISON:  CT angio chest 05/21/2019, CT abdomen  and pelvis 08/19/2012 FINDINGS: Lower chest: Minimal subsegmental atelectasis LEFT lower lobe Hepatobiliary: Gallbladder surgically absent. Liver normal appearance. No definite hepatic mass lesion or nodularity. Pancreas: Normal appearance Spleen: Normal appearance Adrenals/Urinary Tract: LEFT adrenal nodule 18 x 13 mm image 29 previously 18 x 11 mm. RIGHT adrenal gland unremarkable. Kidneys, ureters, and bladder normal appearance Stomach/Bowel: Normal appendix. Slight thickening of the sigmoid colon approximately 3 cm transverse over approximately 4 cm length question neoplasm identified at colonoscopy. Minimal adjacent edema, nonspecific, could potentially be related to preceding endoscopic procedure. No other focal colonic abnormalities seen. Stomach and small bowel loops normal appearance. Vascular/Lymphatic: Mild  atherosclerotic calcifications aorta without aneurysm. No adenopathy. Reproductive: Normal appearance of uterus and LEFT ovary. Fat containing mass in RIGHT adnexa consistent with dermoid tumor, 4.2 x 3.1 x 3.5 cm image 86. Other: No free air or free fluid. No hernia or inflammatory process. Musculoskeletal: Unremarkable IMPRESSION: Thickened segment of sigmoid colon approximately 3 cm diameter by 4 cm length question neoplasm identified by colonoscopy. Nonspecific mild infiltration of pericolic fat adjacent to the thickened sigmoid segment, nonspecific in the setting of preceding endoscopy. 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 Stable LEFT adrenal nodule since 2014 favors a benign etiology. No definite evidence of metastatic disease. Aortic Atherosclerosis (ICD10-I70.0). Electronically Signed   By: Lavonia Dana M.D.   On: 05/26/2019 17:53   US Venous Img Lower Unilateral Right (DVT)  Result Date: 05/23/2019 CLINICAL DATA:  Leg swelling.  Pain. EXAM: RIGHT LOWER EXTREMITY VENOUS DOPPLER ULTRASOUND TECHNIQUE: Gray-scale sonography with graded compression, as well as color Doppler and duplex  ultrasound were performed to evaluate the lower extremity deep venous systems from the level of the common femoral vein and including the common femoral, femoral, profunda femoral, popliteal and calf veins including the posterior tibial, peroneal and gastrocnemius veins when visible. The superficial great saphenous vein was also interrogated. Spectral Doppler was utilized to evaluate flow at rest and with distal augmentation maneuvers in the common femoral, femoral and popliteal veins. COMPARISON:  None. FINDINGS: Contralateral Common Femoral Vein: Respiratory phasicity is normal and symmetric with the symptomatic side. No evidence of thrombus. Normal compressibility. Common Femoral Vein: No evidence of thrombus. Normal compressibility, respiratory phasicity and response to augmentation. Saphenofemoral Junction: No evidence of thrombus. Normal compressibility and flow on color Doppler imaging. Profunda Femoral Vein: No evidence of thrombus. Normal compressibility and flow on color Doppler imaging. Femoral Vein: No evidence of thrombus. Normal compressibility, respiratory phasicity and response to augmentation. Popliteal Vein: No evidence of thrombus. Normal compressibility, respiratory phasicity and response to augmentation. Calf Veins: Evaluation of the peroneal vein was limited by suboptimal compression. There is no obvious thrombus involving the tibial veins. Superficial Great Saphenous Vein: No evidence of thrombus. Normal compressibility. Venous Reflux:  None. Other Findings:  None. IMPRESSION: No evidence of deep venous thrombosis. Electronically Signed   By: Constance Holster M.D.   On: 05/23/2019 21:05   DG Chest Port 1 View  Result Date: 06/03/2019 CLINICAL DATA:  Leukocytosis. EXAM: PORTABLE CHEST 1 VIEW COMPARISON:  CTA chest and chest x-ray dated May 21, 2019. FINDINGS: The heart size and mediastinal contours are within normal limits. Both lungs are clear. The visualized skeletal structures are  unremarkable. IMPRESSION: No active disease. Electronically Signed   By: Titus Dubin M.D.   On: 06/03/2019 12:34   DG Chest Portable 1 View  Result Date: 05/21/2019 CLINICAL DATA:  Cough and fever EXAM: PORTABLE CHEST 1 VIEW COMPARISON:  May 21, 2019 FINDINGS: There is mild elevation of the right hemidiaphragm. Lungs are clear. Heart size and pulmonary vascularity are normal. No adenopathy. No bone lesions. IMPRESSION: Mild elevation of right hemidiaphragm. No edema or consolidation. Cardiac silhouette normal. No adenopathy. Electronically Signed   By: Lowella Grip III M.D.   On: 05/21/2019 16:10   DG Foot Complete Left  Result Date: 05/31/2019 CLINICAL DATA:  Diabetes, Charcot arthropathy EXAM: LEFT FOOT - COMPLETE 3+ VIEW COMPARISON:  04/27/2019, 12/13/2018 FINDINGS: Redemonstration of neuropathic changes of the midfoot with malalignment including fragmentation and medial dislocation of the navicular, dorsal subluxation of the cuneiforms relative to  the hindfoot. There is a similar degree of fragmentation within the midfoot. Tibiotalar and tarsometatarsal joint alignment is maintained. There is diffuse heterogeneity of the osseous structures likely reflecting demineralization. No new fractures are seen. There is diffuse soft tissue prominence and vascular calcifications. IMPRESSION: Findings of Charcot arthropathy of the left midfoot without significant interval progression compared to previous study 04/27/2019. Electronically Signed   By: Davina Poke D.O.   On: 05/31/2019 10:40    ASSESSMENT: Stage I adenocarcinoma of the colon.  PLAN:    1. Stage I adenocarcinoma of the colon: Final pathology and imaging reviewed independently.  Patient had a T2, N0, M0 lesion which does not require adjuvant chemotherapy.  No further interventions are needed.  Patient has been instructed to keep her previously scheduled follow-up appointment in the [facility] on June 10, 2019 at 11:15  AM. 2.  Disposition: Okay to discharge from an oncology standpoint.   Lloyd Huger, MD   06/04/2019 12:51 PM

## 2019-06-04 NOTE — TOC Transition Note (Addendum)
Transition of Care [facility]) - CM/SW Discharge Note   Patient Details  Name: [name] MRN: [id number] Date of Birth: 06-20-1965  [facility]) CM/SW Contact:  Su Hilt, RN Phone Number: 06/04/2019, 1:48 PM   Clinical Narrative:     Patient to DC to [facility] room 4 A today via EMS transport, the patient is to Notify her daughter of the DC,  Bedside nurse to call report to facility and to call EMS for transport, Oncology has said no Chemo and Surgery is saying drain is intact and will need dry dressing change, DC summary with instructions DC packet on the chart and nurse aware  Final next level of care: Skilled Nursing Facility Barriers to Discharge: Barriers Resolved   Patient Goals and CMS Choice Patient states their goals for this hospitalization and ongoing recovery are:: I want them to find where my bleeding is comng from they are running tests      Discharge Placement              Patient chooses bed at: [facility] Patient to be transferred to facility by: EMS Name of family member notified: Patient to notify    Discharge Plan and Services In-house Referral: Clinical Social Work Discharge Planning Services: NA                                 Social Determinants of Health ([place]) Interventions     Readmission Risk Interventions Readmission Risk Prevention Plan 05/24/2019 10/21/2018 10/18/2018  Post Dischage Appt - Complete -  Medication Screening - Complete -  Transportation Screening Complete Complete Complete  PCP or Specialist Appt within 5-7 Days - - -  Home Care Screening - - Complete  Medication Review (RN CM) - - Complete  Medication Review ([place]) Complete - -  PCP or Specialist appointment within 3-5 days of discharge Complete - -  [place] or [place] (No Data) - -  [place] Not Applicable - -  Some recent data might be hidden

## 2019-06-05 DIAGNOSIS — Z48815 Encounter for surgical aftercare following surgery on the digestive system: Secondary | ICD-10-CM | POA: Diagnosis not present

## 2019-06-05 DIAGNOSIS — R262 Difficulty in walking, not elsewhere classified: Secondary | ICD-10-CM | POA: Diagnosis not present

## 2019-06-05 DIAGNOSIS — M6281 Muscle weakness (generalized): Secondary | ICD-10-CM | POA: Diagnosis not present

## 2019-06-06 ENCOUNTER — Telehealth: Payer: Self-pay

## 2019-06-06 NOTE — Telephone Encounter (Signed)
  Patient was recently discharged from the hospital on 06/04/2019  No TCM completed, patient does not qualify for TCM services due to SNF placement.

## 2019-06-07 NOTE — Progress Notes (Signed)
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  INTERVAL HISTORY: Patient is a 54 year old female who was initially evaluated in the hospital for the above-stated colon cancer.  She underwent partial colectomy on May 28, 2019.  She has persistent weakness and fatigue, but admits this is improved since discharge.  She requires a walking boot on her left foot secondary to chronic issues related to her diabetes.  She otherwise feels well.  She has no neurologic complaints.  She denies any recent fevers or illnesses.  She has good appetite and denies weight loss.  She has no chest pain, shortness of breath, cough, or hemoptysis.  She denies any nausea, vomiting, constipation, or diarrhea.  She has no melena or hematochezia.  She has no urinary complaints.  Patient otherwise feels well and offers no further specific complaints today.  REVIEW OF SYSTEMS:   Review of Systems  Constitutional: Positive for malaise/fatigue. Negative for fever and weight loss.  Respiratory: Negative.  Negative for cough, hemoptysis and shortness of breath.   Cardiovascular: Negative.  Negative for chest pain and leg swelling.  Gastrointestinal: Negative.  Negative for abdominal pain, blood in stool and melena.  Genitourinary: Negative.  Negative for dysuria.  Musculoskeletal: Negative.  Negative for back pain.  Skin: Negative.  Negative for rash.  Neurological: Positive for  sensory change. Negative for dizziness, focal weakness, weakness and headaches.  Psychiatric/Behavioral: Negative.  The patient is not nervous/anxious.     As per HPI. Otherwise, a complete review of systems is negative.  PAST MEDICAL HISTORY: Past Medical History:  Diagnosis Date  . Depression   . Diabetes mellitus without complication ([place])   . Hyperlipidemia   . Hypertension   . Neuromuscular disorder ([place])   . Schizo affective schizophrenia ([place])    abilify and pazil    PAST SURGICAL HISTORY: Past Surgical History:  Procedure Laterality Date  . CHOLECYSTECTOMY    . COLONOSCOPY WITH PROPOFOL N/A 05/26/2019   Procedure: COLONOSCOPY WITH PROPOFOL;  Surgeon: Toledo, Benay Pike, MD;  Location: [facility] ENDOSCOPY;  Service: Gastroenterology;  Laterality: N/A;  . ESOPHAGOGASTRODUODENOSCOPY (EGD) WITH PROPOFOL N/A 05/26/2019   Procedure: ESOPHAGOGASTRODUODENOSCOPY (EGD) WITH PROPOFOL;  Surgeon: Toledo, Benay Pike, MD;  Location: [facility] ENDOSCOPY;  Service: Gastroenterology;  Laterality: N/A;  . IRRIGATION AND DEBRIDEMENT FOOT Right 02/26/2016   Procedure: RIGHT 2ND TOE DEBRIDEMENT;  Surgeon: Samara Deist, DPM;  Location: [facility] ORS;  Service: Podiatry;  Laterality: Right;  . IRRIGATION AND DEBRIDEMENT FOOT Left 12/13/2018   Procedure: IRRIGATION AND DEBRIDEMENT FOOT;  Surgeon: Caroline More, DPM;  Location: [facility] ORS;  Service: Podiatry;  Laterality: Left;    FAMILY HISTORY: Family History  Problem Relation Age of Onset  . Breast cancer Cousin        maternal cousin  . Hypertension Mother   . Hyperthyroidism Mother   . Dementia Mother   . Prostate cancer Father   . Diabetes Maternal Grandmother   . Hypertension Maternal Grandmother   . Cancer Maternal Grandmother        unknown type  .  Diabetes Maternal Grandfather   . Hypertension Maternal Grandfather   . Diabetes Paternal Grandmother   . Hypertension Paternal Grandmother   . Diabetes Paternal Grandfather   . Hypertension Paternal  Grandfather     ADVANCED DIRECTIVES (Y/N):  N  HEALTH MAINTENANCE: Social History   Tobacco Use  . Smoking status: Former Smoker    Packs/day: 3.00    Years: 30.00    Pack years: 90.00    Types: Cigarettes    Quit date: 05/02/2009    Years since quitting: 10.1  . Smokeless tobacco: Never Used  . Tobacco comment: Pt reported  quitting in 2011  Substance Use Topics  . Alcohol use: No  . Drug use: No     Colonoscopy:  PAP:  Bone density:  Lipid panel:  No Known Allergies  Current Outpatient Medications  Medication Sig Dispense Refill  . apixaban (ELIQUIS) 2.5 MG TABS tablet Take 1 tablet by mouth 2 (two) times daily.    . ARIPiprazole (ABILIFY) 10 MG tablet Take 10 mg by mouth at bedtime.     [name] ascorbic acid (VITAMIN C) 500 MG tablet Take 500 mg by mouth daily.    [name] aspirin EC 81 MG tablet Take 81 mg by mouth 3 (three) times a week.    [name] atorvastatin (LIPITOR) 40 MG tablet Take 1 tablet (40 mg total) by mouth daily. 90 tablet 3  . buPROPion (WELLBUTRIN XL) 150 MG 24 hr tablet Take 150 mg by mouth every morning.     . ferrous sulfate 325 (65 FE) MG tablet Take 1 tablet (325 mg total) by mouth every other day.  3  . furosemide (LASIX) 40 MG tablet Take 1 tablet (40 mg total) by mouth daily. 30 tablet 11  . gabapentin (NEURONTIN) 300 MG capsule Take 1 capsule (300 mg total) by mouth at bedtime.    [name] ibuprofen (ADVIL) 400 MG tablet Take 1 tablet (400 mg total) by mouth every 6 (six) hours as needed for mild pain (not relieved by tylenol). 30 tablet 0  . insulin aspart (NOVOLOG) 100 UNIT/ML injection Inject 0-15 Units into the skin 3 (three) times daily with meals. 10 mL 11  . Insulin Glargine (LANTUS SOLOSTAR) 100 UNIT/ML Solostar Pen Inject 60 Units into the skin daily before breakfast.    . Lancets (ONETOUCH ULTRASOFT) lancets Use to check blood sugar up to 3 x daily 100 each 12  . lisinopril (ZESTRIL) 40 MG tablet Take 1 tablet (40 mg total) by mouth daily.    . metFORMIN  (GLUCOPHAGE) 1000 MG tablet Take 1 tablet (1,000 mg total) by mouth 2 (two) times daily with a meal. 180 tablet 3  . ondansetron (ZOFRAN) 4 MG tablet Take 1 tablet (4 mg total) by mouth every 6 (six) hours. (Patient taking differently: Take 4 mg by mouth every 8 (eight) hours as needed for nausea or vomiting. ) 12 tablet 0  . ONETOUCH VERIO test strip Check blood sugar up to 5 x daily as needed 450 each 3  . pantoprazole (PROTONIX) 40 MG tablet Take 1 tablet (40 mg total) by mouth at bedtime.    . vitamin B-12 1000 MCG tablet Take 1 tablet (1,000 mcg total) by mouth daily.    [name] acetaminophen (TYLENOL) 325 MG tablet Take 2 tablets (650 mg total) by mouth every 6 (six) hours as needed for mild pain. (Patient not taking: Reported on 06/10/2019)     No current facility-administered medications for this visit.    OBJECTIVE: Vitals:  06/10/19 1122  BP: 132/78  Pulse: 99  Resp: 18  Temp: 97.6 F (36.4 C)  SpO2: 100%     Body mass index is 37.31 kg/m.    ECOG FS:1 - Symptomatic but completely ambulatory  General: Well-developed, well-nourished, no acute distress. Eyes: Pink conjunctiva, anicteric sclera. HEENT: Normocephalic, moist mucous membranes. Lungs: No audible wheezing or coughing. Heart: Regular rate and rhythm. Abdomen: Soft, nontender, no obvious distention. Musculoskeletal: No edema, cyanosis, or clubbing. Neuro: Alert, answering all questions appropriately. Cranial nerves grossly intact. Skin: No rashes or petechiae noted. Psych: Normal affect.  LAB RESULTS:  Lab Results  Component Value Date   NA 131 (L) 06/04/2019   K 4.8 06/04/2019   CL 95 (L) 06/04/2019   CO2 25 06/04/2019   GLUCOSE 154 (H) 06/04/2019   BUN 26 (H) 06/04/2019   CREATININE 1.08 (H) 06/04/2019   CALCIUM 9.0 06/04/2019   PROT 6.6 05/22/2019   ALBUMIN 3.2 (L) 05/22/2019   AST 25 05/22/2019   ALT 34 05/22/2019   ALKPHOS 102 05/22/2019   BILITOT 0.5 05/22/2019   GFRNONAA 58 (L) 06/04/2019   GFRAA  >60 06/04/2019    Lab Results  Component Value Date   WBC 15.3 (H) 06/04/2019   NEUTROABS 9.8 (H) 06/04/2019   HGB 7.9 (L) 06/04/2019   HCT 26.2 (L) 06/04/2019   MCV 78.2 (L) 06/04/2019   PLT 391 06/04/2019     STUDIES: CT Angio Chest PE W and/or Wo Contrast  Result Date: 05/21/2019 CLINICAL DATA:  Shortness of breath. Positive D-dimer. Weakness. Tachycardia. Headache. COVID-19 negative earlier today. EXAM: CT ANGIOGRAPHY CHEST WITH CONTRAST TECHNIQUE: Multidetector CT imaging of the chest was performed using the standard protocol during bolus administration of intravenous contrast. Multiplanar CT image reconstructions and MIPs were obtained to evaluate the vascular anatomy. CONTRAST:  17mL OMNIPAQUE IOHEXOL 350 MG/ML SOLN COMPARISON:  Chest radiograph earlier today.  CTA chest 04/12/2019 FINDINGS: Cardiovascular: The quality of this exam for evaluation of pulmonary embolism is moderate. The bolus is centered in the SVC. No pulmonary embolism to the large segmental level. Aortic atherosclerosis. No dissection. Mild cardiomegaly, without pericardial effusion. Proximal LAD and possible left main coronary artery atherosclerosis, including on 47/4. Mediastinum/Nodes: No mediastinal or hilar adenopathy. Lungs/Pleura: No pleural fluid.  Clear lungs. Upper Abdomen: Cholecystectomy. Normal imaged portions of the liver, spleen, stomach, pancreas, kidneys, right adrenal gland. Left adrenal nodule of 1.4 cm, present back to 08/19/2012 abdominal CT. Musculoskeletal: Midthoracic spondylosis. Mild right hemidiaphragm elevation. Review of the MIP images confirms the above findings. IMPRESSION: 1. Moderate quality evaluation for pulmonary embolism. No pulmonary embolism to the large segmental level. 2. Age advanced coronary artery atherosclerosis. Recommend assessment of coronary risk factors and consideration of medical therapy. 3. Left adrenal nodule, similar to 08/19/2012 and therefore presumably an adenoma.  Electronically Signed   By: Abigail Miyamoto M.D.   On: 05/21/2019 20:09   CT ABDOMEN PELVIS W CONTRAST  Result Date: 05/26/2019 CLINICAL DATA:  Colorectal cancer staging, sigmoid mass on colonoscopy today RIGHT lower quadrant pain and loss of appetite for 2 weeks EXAM: CT ABDOMEN AND PELVIS WITH CONTRAST TECHNIQUE: Multidetector CT imaging of the abdomen and pelvis was performed using the standard protocol following bolus administration of intravenous contrast. Sagittal and coronal MPR images reconstructed from axial data set. CONTRAST:  147mL OMNIPAQUE IOHEXOL 300 MG/ML SOLN IV. No oral contrast was administered COMPARISON:  CT angio chest 05/21/2019, CT abdomen and pelvis 08/19/2012 FINDINGS: Lower chest: Minimal subsegmental atelectasis LEFT lower  lobe Hepatobiliary: Gallbladder surgically absent. Liver normal appearance. No definite hepatic mass lesion or nodularity. Pancreas: Normal appearance Spleen: Normal appearance Adrenals/Urinary Tract: LEFT adrenal nodule 18 x 13 mm image 29 previously 18 x 11 mm. RIGHT adrenal gland unremarkable. Kidneys, ureters, and bladder normal appearance Stomach/Bowel: Normal appendix. Slight thickening of the sigmoid colon approximately 3 cm transverse over approximately 4 cm length question neoplasm identified at colonoscopy. Minimal adjacent edema, nonspecific, could potentially be related to preceding endoscopic procedure. No other focal colonic abnormalities seen. Stomach and small bowel loops normal appearance. Vascular/Lymphatic: Mild atherosclerotic calcifications aorta without aneurysm. No adenopathy. Reproductive: Normal appearance of uterus and LEFT ovary. Fat containing mass in RIGHT adnexa consistent with dermoid tumor, 4.2 x 3.1 x 3.5 cm image 86. Other: No free air or free fluid. No hernia or inflammatory process. Musculoskeletal: Unremarkable IMPRESSION: Thickened segment of sigmoid colon approximately 3 cm diameter by 4 cm length question neoplasm identified by  colonoscopy. Nonspecific mild infiltration of pericolic fat adjacent to the thickened sigmoid segment, nonspecific in the setting of preceding endoscopy. 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 Stable LEFT adrenal nodule since 2014 favors a benign etiology. No definite evidence of metastatic disease. Aortic Atherosclerosis (ICD10-I70.0). Electronically Signed   By: Lavonia Dana M.D.   On: 05/26/2019 17:53   US Venous Img Lower Unilateral Right (DVT)  Result Date: 05/23/2019 CLINICAL DATA:  Leg swelling.  Pain. EXAM: RIGHT LOWER EXTREMITY VENOUS DOPPLER ULTRASOUND TECHNIQUE: Gray-scale sonography with graded compression, as well as color Doppler and duplex ultrasound were performed to evaluate the lower extremity deep venous systems from the level of the common femoral vein and including the common femoral, femoral, profunda femoral, popliteal and calf veins including the posterior tibial, peroneal and gastrocnemius veins when visible. The superficial great saphenous vein was also interrogated. Spectral Doppler was utilized to evaluate flow at rest and with distal augmentation maneuvers in the common femoral, femoral and popliteal veins. COMPARISON:  None. FINDINGS: Contralateral Common Femoral Vein: Respiratory phasicity is normal and symmetric with the symptomatic side. No evidence of thrombus. Normal compressibility. Common Femoral Vein: No evidence of thrombus. Normal compressibility, respiratory phasicity and response to augmentation. Saphenofemoral Junction: No evidence of thrombus. Normal compressibility and flow on color Doppler imaging. Profunda Femoral Vein: No evidence of thrombus. Normal compressibility and flow on color Doppler imaging. Femoral Vein: No evidence of thrombus. Normal compressibility, respiratory phasicity and response to augmentation. Popliteal Vein: No evidence of thrombus. Normal compressibility, respiratory phasicity and response to augmentation. Calf Veins: Evaluation of the  peroneal vein was limited by suboptimal compression. There is no obvious thrombus involving the tibial veins. Superficial Great Saphenous Vein: No evidence of thrombus. Normal compressibility. Venous Reflux:  None. Other Findings:  None. IMPRESSION: No evidence of deep venous thrombosis. Electronically Signed   By: Constance Holster M.D.   On: 05/23/2019 21:05   DG Chest Port 1 View  Result Date: 06/03/2019 CLINICAL DATA:  Leukocytosis. EXAM: PORTABLE CHEST 1 VIEW COMPARISON:  CTA chest and chest x-ray dated May 21, 2019. FINDINGS: The heart size and mediastinal contours are within normal limits. Both lungs are clear. The visualized skeletal structures are unremarkable. IMPRESSION: No active disease. Electronically Signed   By: Titus Dubin M.D.   On: 06/03/2019 12:34   DG Chest Portable 1 View  Result Date: 05/21/2019 CLINICAL DATA:  Cough and fever EXAM: PORTABLE CHEST 1 VIEW COMPARISON:  May 21, 2019 FINDINGS: There is mild elevation of the right hemidiaphragm.  Lungs are clear. Heart size and pulmonary vascularity are normal. No adenopathy. No bone lesions. IMPRESSION: Mild elevation of right hemidiaphragm. No edema or consolidation. Cardiac silhouette normal. No adenopathy. Electronically Signed   By: Lowella Grip III M.D.   On: 05/21/2019 16:10   DG Foot Complete Left  Result Date: 05/31/2019 CLINICAL DATA:  Diabetes, Charcot arthropathy EXAM: LEFT FOOT - COMPLETE 3+ VIEW COMPARISON:  04/27/2019, 12/13/2018 FINDINGS: Redemonstration of neuropathic changes of the midfoot with malalignment including fragmentation and medial dislocation of the navicular, dorsal subluxation of the cuneiforms relative to the hindfoot. There is a similar degree of fragmentation within the midfoot. Tibiotalar and tarsometatarsal joint alignment is maintained. There is diffuse heterogeneity of the osseous structures likely reflecting demineralization. No new fractures are seen. There is diffuse soft tissue  prominence and vascular calcifications. IMPRESSION: Findings of Charcot arthropathy of the left midfoot without significant interval progression compared to previous study 04/27/2019. Electronically Signed   By: Davina Poke D.O.   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  PLAN:    1. 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 Final pathology and imaging reviewed independently.  Patient had a T2, N0, M0 lesion which does not require adjuvant chemotherapy.  No further interventions are needed.    CEA is within normal limits.  Continue follow-up with surgery as indicated.  Return to clinic in 3 months with repeat laboratory work and further evaluation.  Patient will require repeat colonoscopy in 6 to 12 months.  I spent a total of 30 minutes reviewing chart data, face-to-face evaluation with the patient, counseling and coordination of care as detailed above.   Patient expressed understanding and was in agreement with this plan. She also understands that She can call clinic at any time with any questions, concerns, or complaints.   Cancer Staging Adenocarcinoma of sigmoid colon [facility]) Staging form: Colon and Rectum, AJCC 8th Edition - Clinical stage from 06/07/2019: Stage I (cT2, cN0, cM0) - Signed by Lloyd Huger, MD on 06/07/2019   Lloyd Huger, MD   06/10/2019 1:51 PM

## 2019-06-08 LAB — CULTURE, BLOOD (ROUTINE X 2)
Culture: NO GROWTH
Special Requests: ADEQUATE

## 2019-06-10 ENCOUNTER — Inpatient Hospital Stay: Payer: Medicare Other | Attending: Oncology | Admitting: Oncology

## 2019-06-10 ENCOUNTER — Other Ambulatory Visit: Payer: Self-pay

## 2019-06-10 ENCOUNTER — Encounter: Payer: Self-pay | Admitting: Oncology

## 2019-06-10 VITALS — BP 132/78 | HR 99 | Temp 97.6°F | Resp 18 | Wt 260.0 lb

## 2019-06-10 DIAGNOSIS — R5383 Other fatigue: Secondary | ICD-10-CM | POA: Insufficient documentation

## 2019-06-10 DIAGNOSIS — F209 Schizophrenia, unspecified: Secondary | ICD-10-CM | POA: Diagnosis not present

## 2019-06-10 DIAGNOSIS — D649 Anemia, unspecified: Secondary | ICD-10-CM | POA: Diagnosis not present

## 2019-06-10 DIAGNOSIS — E785 Hyperlipidemia, unspecified: Secondary | ICD-10-CM | POA: Diagnosis not present

## 2019-06-10 DIAGNOSIS — Z79899 Other long term (current) drug therapy: Secondary | ICD-10-CM | POA: Diagnosis not present

## 2019-06-10 DIAGNOSIS — Z7901 Long term (current) use of anticoagulants: Secondary | ICD-10-CM | POA: Diagnosis not present

## 2019-06-10 DIAGNOSIS — E1165 Type 2 diabetes mellitus with hyperglycemia: Secondary | ICD-10-CM | POA: Diagnosis not present

## 2019-06-10 DIAGNOSIS — C187 Malignant neoplasm of sigmoid colon: Secondary | ICD-10-CM | POA: Diagnosis not present

## 2019-06-10 DIAGNOSIS — Z791 Long term (current) use of non-steroidal anti-inflammatories (NSAID): Secondary | ICD-10-CM | POA: Insufficient documentation

## 2019-06-10 DIAGNOSIS — E11610 Type 2 diabetes mellitus with diabetic neuropathic arthropathy: Secondary | ICD-10-CM | POA: Diagnosis not present

## 2019-06-10 DIAGNOSIS — Z803 Family history of malignant neoplasm of breast: Secondary | ICD-10-CM | POA: Diagnosis not present

## 2019-06-10 DIAGNOSIS — R531 Weakness: Secondary | ICD-10-CM | POA: Diagnosis not present

## 2019-06-10 DIAGNOSIS — Z87891 Personal history of nicotine dependence: Secondary | ICD-10-CM | POA: Insufficient documentation

## 2019-06-10 DIAGNOSIS — Z794 Long term (current) use of insulin: Secondary | ICD-10-CM | POA: Diagnosis not present

## 2019-06-10 DIAGNOSIS — F329 Major depressive disorder, single episode, unspecified: Secondary | ICD-10-CM | POA: Diagnosis not present

## 2019-06-10 DIAGNOSIS — I10 Essential (primary) hypertension: Secondary | ICD-10-CM | POA: Diagnosis not present

## 2019-06-10 DIAGNOSIS — Z9049 Acquired absence of other specified parts of digestive tract: Secondary | ICD-10-CM | POA: Insufficient documentation

## 2019-06-10 DIAGNOSIS — Z7982 Long term (current) use of aspirin: Secondary | ICD-10-CM | POA: Insufficient documentation

## 2019-06-10 NOTE — Progress Notes (Signed)
Patient is here for hospital follow up she is doing well no complaints

## 2019-06-10 NOTE — Progress Notes (Signed)
Met with [name] following appointment with [name]. She is currently residing at [facility]. Called and made her a post op appointment with Dr. Christian Mate and provided this in her AVS. She has her three month follow up arranged with [name]. Provided her an extra copy of her AVS at her request to provide [facility] so they can arrange transportation to her appointments. Escorted to the lobby to await her transportation.

## 2019-06-11 ENCOUNTER — Telehealth: Payer: Self-pay

## 2019-06-11 ENCOUNTER — Ambulatory Visit: Payer: Self-pay | Admitting: Pharmacist

## 2019-06-11 DIAGNOSIS — R262 Difficulty in walking, not elsewhere classified: Secondary | ICD-10-CM | POA: Diagnosis not present

## 2019-06-11 DIAGNOSIS — D62 Acute posthemorrhagic anemia: Secondary | ICD-10-CM | POA: Diagnosis not present

## 2019-06-11 DIAGNOSIS — L03116 Cellulitis of left lower limb: Secondary | ICD-10-CM | POA: Diagnosis not present

## 2019-06-11 NOTE — Chronic Care Management (AMB) (Signed)
  Chronic Care Management   Follow Up Note   06/11/2019 Name: [name] MRN: [id number] DOB: 11-16-1965  Referred by: [name], DO Reason for referral : Care Coordination   [name] is a 54 y.o. year old female who is a primary care patient of [name], DO. The CCM team was consulted for assistance with chronic disease management and care coordination needs.    Per chart review, patient discharged from hospital on 2/2 to [facility]. Note post-discharge follow up scheduled with PCP.   Plan  [name] is available to follow up with the patient further following discharge from skilled nursing facility.   Harlow Asa, PharmD, [place] [employer] [phone]

## 2019-06-12 ENCOUNTER — Ambulatory Visit: Payer: Medicare Other | Admitting: Family Medicine

## 2019-06-13 ENCOUNTER — Encounter: Payer: Medicare Other | Admitting: Surgery

## 2019-06-14 DIAGNOSIS — M14672 Charcot's joint, left ankle and foot: Secondary | ICD-10-CM | POA: Diagnosis not present

## 2019-06-14 DIAGNOSIS — M25475 Effusion, left foot: Secondary | ICD-10-CM | POA: Diagnosis not present

## 2019-06-14 DIAGNOSIS — Z9119 Patient's noncompliance with other medical treatment and regimen: Secondary | ICD-10-CM | POA: Diagnosis not present

## 2019-06-14 DIAGNOSIS — E1142 Type 2 diabetes mellitus with diabetic polyneuropathy: Secondary | ICD-10-CM | POA: Diagnosis not present

## 2019-06-14 DIAGNOSIS — M25474 Effusion, right foot: Secondary | ICD-10-CM | POA: Diagnosis not present

## 2019-06-18 ENCOUNTER — Inpatient Hospital Stay: Payer: Medicare Other | Admitting: Family Medicine

## 2019-06-18 ENCOUNTER — Other Ambulatory Visit: Payer: Self-pay

## 2019-06-20 ENCOUNTER — Encounter: Payer: Medicare Other | Admitting: Surgery

## 2019-06-24 ENCOUNTER — Telehealth: Payer: Self-pay

## 2019-06-25 ENCOUNTER — Ambulatory Visit (INDEPENDENT_AMBULATORY_CARE_PROVIDER_SITE_OTHER): Payer: Medicare Other | Admitting: Licensed Clinical Social Worker

## 2019-06-25 DIAGNOSIS — E1165 Type 2 diabetes mellitus with hyperglycemia: Secondary | ICD-10-CM | POA: Diagnosis not present

## 2019-06-25 DIAGNOSIS — E1169 Type 2 diabetes mellitus with other specified complication: Secondary | ICD-10-CM

## 2019-06-25 DIAGNOSIS — E1149 Type 2 diabetes mellitus with other diabetic neurological complication: Secondary | ICD-10-CM | POA: Diagnosis not present

## 2019-06-25 DIAGNOSIS — I10 Essential (primary) hypertension: Secondary | ICD-10-CM

## 2019-06-25 DIAGNOSIS — F251 Schizoaffective disorder, depressive type: Secondary | ICD-10-CM

## 2019-06-25 DIAGNOSIS — IMO0002 Reserved for concepts with insufficient information to code with codable children: Secondary | ICD-10-CM

## 2019-06-25 DIAGNOSIS — E785 Hyperlipidemia, unspecified: Secondary | ICD-10-CM | POA: Diagnosis not present

## 2019-06-25 NOTE — Chronic Care Management (AMB) (Signed)
Chronic Care Management    Clinical Social Work Follow Up Note  06/25/2019 Name: [name] MRN: [id number] DOB: June 28, 1965  [name] is a 54 y.o. year old female who is a primary care patient of [name], DO. The CCM team was consulted for assistance with Level of Care Concerns.   Review of patient status, including review of consultants reports, other relevant assessments, and collaboration with appropriate care team members and the patient's provider was performed as part of comprehensive patient evaluation and provision of chronic care management services.    SDOH (Social Determinants of Health) assessments performed: Yes    Advanced Directives Status: <no information> See Care Plan for related entries.   Outpatient Encounter Medications as of 06/25/2019  Medication Sig  . acetaminophen (TYLENOL) 325 MG tablet Take 2 tablets (650 mg total) by mouth every 6 (six) hours as needed for mild pain. (Patient not taking: Reported on 06/10/2019)  . apixaban (ELIQUIS) 2.5 MG TABS tablet Take 1 tablet by mouth 2 (two) times daily.  . ARIPiprazole (ABILIFY) 10 MG tablet Take 10 mg by mouth at bedtime.   [name] ascorbic acid (VITAMIN C) 500 MG tablet Take 500 mg by mouth daily.  [name] aspirin EC 81 MG tablet Take 81 mg by mouth 3 (three) times a week.  [name] atorvastatin (LIPITOR) 40 MG tablet Take 1 tablet (40 mg total) by mouth daily.  [name] buPROPion (WELLBUTRIN XL) 150 MG 24 hr tablet Take 150 mg by mouth every morning.   . ferrous sulfate 325 (65 FE) MG tablet Take 1 tablet (325 mg total) by mouth every other day.  . furosemide (LASIX) 40 MG tablet Take 1 tablet (40 mg total) by mouth daily.  [name] gabapentin (NEURONTIN) 300 MG capsule Take 1 capsule (300 mg total) by mouth at bedtime.  [name] ibuprofen (ADVIL) 400 MG tablet Take 1 tablet (400 mg total) by mouth every 6 (six) hours as needed for mild pain (not relieved by tylenol).  . insulin aspart (NOVOLOG) 100 UNIT/ML injection  Inject 0-15 Units into the skin 3 (three) times daily with meals.  . Insulin Glargine (LANTUS SOLOSTAR) 100 UNIT/ML Solostar Pen Inject 60 Units into the skin daily before breakfast.  . Lancets (ONETOUCH ULTRASOFT) lancets Use to check blood sugar up to 3 x daily  . lisinopril (ZESTRIL) 40 MG tablet Take 1 tablet (40 mg total) by mouth daily.  . metFORMIN (GLUCOPHAGE) 1000 MG tablet Take 1 tablet (1,000 mg total) by mouth 2 (two) times daily with a meal.  . ondansetron (ZOFRAN) 4 MG tablet Take 1 tablet (4 mg total) by mouth every 6 (six) hours. (Patient taking differently: Take 4 mg by mouth every 8 (eight) hours as needed for nausea or vomiting. )  . ONETOUCH VERIO test strip Check blood sugar up to 5 x daily as needed  . pantoprazole (PROTONIX) 40 MG tablet Take 1 tablet (40 mg total) by mouth at bedtime.  . vitamin B-12 1000 MCG tablet Take 1 tablet (1,000 mcg total) by mouth daily.   No facility-administered encounter medications on file as of 06/25/2019.     Goals Addressed    . SW- "I want to manage my health conditions and get better." (pt-stated)       Current Barriers:  . Financial constraints related to managing health care within the home . Limited social support . ADL IADL limitations . Social Isolation . Inability to perform ADL's independently . Inability to perform IADL's independently . INABILITY  TO MAKE APPROPRIATE AND HEALTHY DECISIONS FOR SELF PER DAUGHTER . Patient is refusing care, baths, medications, follow up appointments and suggestions but now is at [facility].  Clinical Social Work Clinical Goal(s):  [name] Over the next 120 days, patient will work with SW to address concerns related to gaining additional support within the home or LTC placement and get connecting to local community resources/mental health support   Interventions: . Discussed plans with patient for ongoing care management follow up and provided patient with direct contact information  for care management team . Provided education and assistance to client regarding Advanced Directives. . A voluntary and extensive discussion about advanced care planning including explanation and discussion of advanced was undertaken with the patient and daughter.  Explanation regarding healthcare proxy and living will was reviewed and packet with forms with explanation of how to fill them out was given.   . Provided education to patient/caregiver regarding level of care options. . Provided education to patient/caregiver about Hospice and/or Palliative Care services . LCSW discussed coping skills for managing health care. SW used active and reflective listening, validated patient's daughters feelings/concerns, and provided emotional support. LCSW provided self-care education to caregiver help manage her mother's multiple health conditions and improve her overall mood. Daughter reports that patient is actively receiving mental health services at [facility] with [name].  . Daughter reports that she can no longer take care of patient in the home now that she is refusing care. Daughter agreeable to APS report. Patient went to ED yesterday. Patient's blood sugars and plus remain low.  [name] CCM LCSW and CCM RNCM collaborated with Briane Birden daughter [name] today after receiving your message and she is agreeable to take patient to [facility] for psych evaluation today on 05/22/19. We called APS and filed a report but they are requesting a signed letter from the PCP stating her competency level. They almost did not want to take the case without that. Pam and I challenged that several times with APS caseworker. CCM team sent in basket message to Dr. Raliegh Ip asking if we could get a signed letter about our specific concerns in regards to her ability to make decisions for self, neglecting her physical and mental health care, refusing test at hospital on 05/21/2019, non-compliance issues with taking medications, taking baths,  attending follow up appointments and not sharing information with family, etc. This letter can be emailed to DSS at [email or web address]  . UPDATE-Patient is still at [facility] with an active APS case and will be there until March. Patient received the COVID vaccine and then tested positive for COVID. Family reports that patient is very lonely right now but is receiving excellent care. Upon patient's SNF discharge, family will look into patient moving into at a senior apartment.   Patient Self Care Activities:  . Attends all scheduled provider appointments . Calls provider office for new concerns or questions . Lacks social connections  Please see past updates related to this goal by clicking on the "Past Updates" button in the selected goal      Follow Up Plan: SW will follow up with patient by phone over the next quarter  [name], [place], MSW, [email or web address] Phone: [phone]

## 2019-06-28 DIAGNOSIS — D62 Acute posthemorrhagic anemia: Secondary | ICD-10-CM | POA: Diagnosis not present

## 2019-06-28 DIAGNOSIS — R262 Difficulty in walking, not elsewhere classified: Secondary | ICD-10-CM | POA: Diagnosis not present

## 2019-07-03 DIAGNOSIS — Z8631 Personal history of diabetic foot ulcer: Secondary | ICD-10-CM | POA: Diagnosis not present

## 2019-07-03 DIAGNOSIS — D62 Acute posthemorrhagic anemia: Secondary | ICD-10-CM | POA: Diagnosis not present

## 2019-07-03 DIAGNOSIS — M2041 Other hammer toe(s) (acquired), right foot: Secondary | ICD-10-CM | POA: Diagnosis not present

## 2019-07-03 DIAGNOSIS — C187 Malignant neoplasm of sigmoid colon: Secondary | ICD-10-CM | POA: Diagnosis not present

## 2019-07-03 DIAGNOSIS — K219 Gastro-esophageal reflux disease without esophagitis: Secondary | ICD-10-CM | POA: Diagnosis not present

## 2019-07-03 DIAGNOSIS — E11610 Type 2 diabetes mellitus with diabetic neuropathic arthropathy: Secondary | ICD-10-CM | POA: Diagnosis not present

## 2019-07-03 DIAGNOSIS — E1142 Type 2 diabetes mellitus with diabetic polyneuropathy: Secondary | ICD-10-CM | POA: Diagnosis not present

## 2019-07-03 DIAGNOSIS — Z9049 Acquired absence of other specified parts of digestive tract: Secondary | ICD-10-CM | POA: Diagnosis not present

## 2019-07-03 DIAGNOSIS — Z794 Long term (current) use of insulin: Secondary | ICD-10-CM | POA: Diagnosis not present

## 2019-07-03 DIAGNOSIS — M14672 Charcot's joint, left ankle and foot: Secondary | ICD-10-CM | POA: Diagnosis not present

## 2019-07-03 DIAGNOSIS — E7849 Other hyperlipidemia: Secondary | ICD-10-CM | POA: Diagnosis not present

## 2019-07-03 DIAGNOSIS — Z87891 Personal history of nicotine dependence: Secondary | ICD-10-CM | POA: Diagnosis not present

## 2019-07-03 DIAGNOSIS — Z8616 Personal history of COVID-19: Secondary | ICD-10-CM | POA: Diagnosis not present

## 2019-07-03 DIAGNOSIS — Z483 Aftercare following surgery for neoplasm: Secondary | ICD-10-CM | POA: Diagnosis not present

## 2019-07-03 DIAGNOSIS — I10 Essential (primary) hypertension: Secondary | ICD-10-CM | POA: Diagnosis not present

## 2019-07-03 DIAGNOSIS — M6281 Muscle weakness (generalized): Secondary | ICD-10-CM | POA: Diagnosis not present

## 2019-07-03 DIAGNOSIS — E1165 Type 2 diabetes mellitus with hyperglycemia: Secondary | ICD-10-CM | POA: Diagnosis not present

## 2019-07-08 ENCOUNTER — Ambulatory Visit (INDEPENDENT_AMBULATORY_CARE_PROVIDER_SITE_OTHER): Payer: Medicare Other | Admitting: General Practice

## 2019-07-08 DIAGNOSIS — E1169 Type 2 diabetes mellitus with other specified complication: Secondary | ICD-10-CM

## 2019-07-08 DIAGNOSIS — E1149 Type 2 diabetes mellitus with other diabetic neurological complication: Secondary | ICD-10-CM

## 2019-07-08 DIAGNOSIS — F251 Schizoaffective disorder, depressive type: Secondary | ICD-10-CM

## 2019-07-08 DIAGNOSIS — I10 Essential (primary) hypertension: Secondary | ICD-10-CM

## 2019-07-08 DIAGNOSIS — IMO0002 Reserved for concepts with insufficient information to code with codable children: Secondary | ICD-10-CM

## 2019-07-08 DIAGNOSIS — E785 Hyperlipidemia, unspecified: Secondary | ICD-10-CM

## 2019-07-08 NOTE — Patient Instructions (Signed)
Visit Information  Goals Addressed            This Visit's Progress   . RNCM- I want to get my diabetes back on track (pt-stated)       Current Barriers:  [name] Knowledge Deficits related to basic Diabetes pathophysiology and self care/management: Does not use CBG meter, has had to be seen in ED for blood sugars >500 . Knowledge Deficits related to medications used for management of diabetes   Case Manager Clinical Goal(s):  Over the next 120 days, patient will demonstrate improved adherence to prescribed treatment plan for diabetes self care/management as evidenced by:  . daily monitoring and recording of CBG  . adherence to ADA/ carb modified diet . exercise 3 days/week . adherence to prescribed medication regimen : Daughter giving patient all of her daily medications, the daughter says the patient will not take her medication if she does not direct her to take- this is still a concern as per the daughter today . Decreased visits to the ED as evidence by patient being more independent and actively participating in her care: Patient recently in the ED related to cellulitis and other chronic disease processes-Called today for post discharge assessment and follow up  Interventions:  . Evaluated current diabetes management.  The daughter states he blood sugars are still elevated and that she will hear the patient in the kitchen at night. The patient is sleeping during the day and staying up at night. . Assessed current medication administration and blood glucose monitoring.  The daughter states the patient will not take meds on her own without being prompted and is still not doing self care    Patient Self Care Activities:  . UNABLE to independently self manage diabetes as evidenced by A1C >10  Please see past updates related to this goal by clicking on the "Past Updates" button in the selected goal      . RNCM: Daughter [name]) I don't know how to help my mom.       Current  Barriers:  . Chronic Disease Managemant support, education, and care coordination needs related to HTN, DMII, and Schizo affective disorder  Clinical Goal(s) related to HTN, DMII, and Schizo affective disorder :  Over the next 120 days, patient will: . Work with the care management team to address educational, disease management, and care coordination needs  . Begin or continue self health monitoring activities as directed today The patients daughter, [name] is concerned because the patient is not taking her medications as directed, not doing daily self care, and refusing things just like she did before hospitalization . Call provider office for new or worsened signs and symptoms New or worsened symptom related to HTN, DM and Schizoaffective disorder . Call care management team with questions or concerns . Verbalize basic understanding of patient centered plan of care established today   Interventions related to HTN, DMII, and Schizo affective disorder :  . Evaluation of current treatment plans and patient's adherence to plan as established by provider  . Assessed patient understanding of disease states: The patients daughter, [name] wants to help the patient and does not know how to take care of the patients complex medical conditions . Assessed patient's education and care coordination needs: Working with the [facility] and Pharmacist  . Provided disease specific education to daughter . Collaborated with appropriate clinical care team members regarding patient needs . Report made to APS of [facility] related to concerns and safety of the patient  05-22-2019.  Melinda with APS of [facility] has returned a call and states the report has been accepted and has been assigned to [employer] . Assessed post discharge needs. The patient has a follow up with Dr. Parks Ranger on 3/9.  The daughter verbalized she wanted to know if her mom needed to be on Iron and also inquired about home health  to help in the home with bathing and physical therapy for strengthening. The daughter expressed that the patient has had accidents since being at home and is not taking the initiative to provide self care.  . Assessed the for evaluation for psychiatric needs. The daughter expressed they did an evaluation on the patient in the hospital and said they could not find any reason for her to be kept for psychiatric needs.   Patient Self Care Activities related to HTN, DMII, and schizo affective disorder :  . Patient is unable to independently self-manage chronic health conditions         Patient verbalizes understanding of instructions provided today.   The care management team will reach out to the patient again over the next 30 to 60 days.   Noreene Larsson RN, MSN, [place] Mobile: [phone]

## 2019-07-08 NOTE — Chronic Care Management (AMB) (Signed)
Chronic Care Management   Follow Up Note   07/08/2019 Name: [name] MRN: [id number] DOB: [birth year]  Referred by: [name], DO Reason for referral : Chronic Care Management (Post discharge follow up: HTN/DM2/HLD)   [name] is a 54 y.o. year old female who is a primary care patient of [name], DO. The CCM team was consulted for assistance with chronic disease management and care coordination needs.    Review of patient status, including review of consultants reports, relevant laboratory and other test results, and collaboration with appropriate care team members and the patient's provider was performed as part of comprehensive patient evaluation and provision of chronic care management services.    SDOH (Social Determinants of Health) assessments performed: Yes See Care Plan activities for detailed interventions related to [facility])     Outpatient Encounter Medications as of 07/08/2019  Medication Sig   acetaminophen (TYLENOL) 325 MG tablet Take 2 tablets (650 mg total) by mouth every 6 (six) hours as needed for mild pain. (Patient not taking: Reported on 06/10/2019)   apixaban (ELIQUIS) 2.5 MG TABS tablet Take 1 tablet by mouth 2 (two) times daily.   ARIPiprazole (ABILIFY) 10 MG tablet Take 10 mg by mouth at bedtime.    ascorbic acid (VITAMIN C) 500 MG tablet Take 500 mg by mouth daily.   aspirin EC 81 MG tablet Take 81 mg by mouth 3 (three) times a week.   atorvastatin (LIPITOR) 40 MG tablet Take 1 tablet (40 mg total) by mouth daily.   buPROPion (WELLBUTRIN XL) 150 MG 24 hr tablet Take 150 mg by mouth every morning.    ferrous sulfate 325 (65 FE) MG tablet Take 1 tablet (325 mg total) by mouth every other day.   furosemide (LASIX) 40 MG tablet Take 1 tablet (40 mg total) by mouth daily.   gabapentin (NEURONTIN) 300 MG capsule Take 1 capsule (300 mg total) by mouth at bedtime.   ibuprofen (ADVIL) 400 MG tablet Take 1 tablet  (400 mg total) by mouth every 6 (six) hours as needed for mild pain (not relieved by tylenol).   insulin aspart (NOVOLOG) 100 UNIT/ML injection Inject 0-15 Units into the skin 3 (three) times daily with meals.   Insulin Glargine (LANTUS SOLOSTAR) 100 UNIT/ML Solostar Pen Inject 60 Units into the skin daily before breakfast.   Lancets (ONETOUCH ULTRASOFT) lancets Use to check blood sugar up to 3 x daily   lisinopril (ZESTRIL) 40 MG tablet Take 1 tablet (40 mg total) by mouth daily.   metFORMIN (GLUCOPHAGE) 1000 MG tablet Take 1 tablet (1,000 mg total) by mouth 2 (two) times daily with a meal.   ondansetron (ZOFRAN) 4 MG tablet Take 1 tablet (4 mg total) by mouth every 6 (six) hours. (Patient taking differently: Take 4 mg by mouth every 8 (eight) hours as needed for nausea or vomiting. )   ONETOUCH VERIO test strip Check blood sugar up to 5 x daily as needed   pantoprazole (PROTONIX) 40 MG tablet Take 1 tablet (40 mg total) by mouth at bedtime.   vitamin B-12 1000 MCG tablet Take 1 tablet (1,000 mcg total) by mouth daily.   No facility-administered encounter medications on file as of 07/08/2019.     Objective:   Goals Addressed            This Visit's Progress    RNCM- I want to get my diabetes back on track (pt-stated)       Current Barriers:  Knowledge Deficits related to basic Diabetes pathophysiology and self care/management: Does not use CBG meter, has had to be seen in ED for blood sugars >500  Knowledge Deficits related to medications used for management of diabetes   Case Manager Clinical Goal(s):  Over the next 120 days, patient will demonstrate improved adherence to prescribed treatment plan for diabetes self care/management as evidenced by:   daily monitoring and recording of CBG   adherence to ADA/ carb modified diet  exercise 3 days/week  adherence to prescribed medication regimen : Daughter giving patient all of her daily medications, the daughter says the  patient will not take her medication if she does not direct her to take- this is still a concern as per the daughter today  Decreased visits to the ED as evidence by patient being more independent and actively participating in her care: Patient recently in the ED related to cellulitis and other chronic disease processes-Called today for post discharge assessment and follow up  Interventions:   Evaluated current diabetes management.  The daughter states he blood sugars are still elevated and that she will hear the patient in the kitchen at night. The patient is sleeping during the day and staying up at night.  Assessed current medication administration and blood glucose monitoring.  The daughter states the patient will not take meds on her own without being prompted and is still not doing self care    Patient Self Care Activities:   UNABLE to independently self manage diabetes as evidenced by A1C >10  Please see past updates related to this goal by clicking on the "Past Updates" button in the selected goal       RNCM: Daughter [name]) I don't know how to help my mom.       Current Barriers:   Chronic Disease Managemant support, education, and care coordination needs related to HTN, DMII, and Schizo affective disorder  Clinical Goal(s) related to HTN, DMII, and Schizo affective disorder :  Over the next 120 days, patient will:  Work with the care management team to address educational, disease management, and care coordination needs   Begin or continue self health monitoring activities as directed today The patients daughter, [name] is concerned because the patient is not taking her medications as directed, not doing daily self care, and refusing things just like she did before hospitalization  Call provider office for new or worsened signs and symptoms New or worsened symptom related to HTN, DM and Schizoaffective disorder  Call care management team with questions or  concerns  Verbalize basic understanding of patient centered plan of care established today   Interventions related to HTN, DMII, and Schizo affective disorder :   Evaluation of current treatment plans and patient's adherence to plan as established by provider   Assessed patient understanding of disease states: The patients daughter, [name] wants to help the patient and does not know how to take care of the patients complex medical conditions  Assessed patient's education and care coordination needs: Working with the [facility] and Pharmacist   Provided disease specific education to daughter  Collaborated with appropriate clinical care team members regarding patient needs  Report made to APS of [facility] related to concerns and safety of the patient 05-22-2019.  Melinda with APS of [facility] has returned a call and states the report has been accepted and has been assigned to [employer] post discharge needs. The patient has a follow up with Dr. Parks Ranger on 3/9.  The daughter verbalized she wanted to know if her mom needed to be on Iron and also inquired about home health to help in the home with bathing and physical therapy for strengthening. The daughter expressed that the patient has had accidents since being at home and is not taking the initiative to provide self care.   Assessed the for evaluation for psychiatric needs. The daughter expressed they did an evaluation on the patient in the hospital and said they could not find any reason for her to be kept for psychiatric needs.   Patient Self Care Activities related to HTN, DMII, and schizo affective disorder :   Patient is unable to independently self-manage chronic health conditions          Plan:   The care management team will reach out to the patient again over the next 30 to 60 days.    Noreene Larsson RN, MSN, [place] Mobile: [phone]

## 2019-07-09 ENCOUNTER — Encounter: Payer: Medicare Other | Admitting: Surgery

## 2019-07-09 ENCOUNTER — Encounter: Payer: Self-pay | Admitting: Family Medicine

## 2019-07-09 ENCOUNTER — Other Ambulatory Visit: Payer: Self-pay

## 2019-07-09 ENCOUNTER — Ambulatory Visit (INDEPENDENT_AMBULATORY_CARE_PROVIDER_SITE_OTHER): Payer: Medicare Other | Admitting: Family Medicine

## 2019-07-09 DIAGNOSIS — C187 Malignant neoplasm of sigmoid colon: Secondary | ICD-10-CM

## 2019-07-09 DIAGNOSIS — M6281 Muscle weakness (generalized): Secondary | ICD-10-CM | POA: Diagnosis not present

## 2019-07-09 DIAGNOSIS — IMO0002 Reserved for concepts with insufficient information to code with codable children: Secondary | ICD-10-CM

## 2019-07-09 DIAGNOSIS — E11610 Type 2 diabetes mellitus with diabetic neuropathic arthropathy: Secondary | ICD-10-CM | POA: Diagnosis not present

## 2019-07-09 DIAGNOSIS — F251 Schizoaffective disorder, depressive type: Secondary | ICD-10-CM

## 2019-07-09 DIAGNOSIS — L97522 Non-pressure chronic ulcer of other part of left foot with fat layer exposed: Secondary | ICD-10-CM

## 2019-07-09 DIAGNOSIS — Z8631 Personal history of diabetic foot ulcer: Secondary | ICD-10-CM | POA: Diagnosis not present

## 2019-07-09 DIAGNOSIS — D62 Acute posthemorrhagic anemia: Secondary | ICD-10-CM | POA: Diagnosis not present

## 2019-07-09 DIAGNOSIS — I10 Essential (primary) hypertension: Secondary | ICD-10-CM | POA: Diagnosis not present

## 2019-07-09 DIAGNOSIS — Z794 Long term (current) use of insulin: Secondary | ICD-10-CM | POA: Diagnosis not present

## 2019-07-09 DIAGNOSIS — E1149 Type 2 diabetes mellitus with other diabetic neurological complication: Secondary | ICD-10-CM | POA: Diagnosis not present

## 2019-07-09 DIAGNOSIS — E1165 Type 2 diabetes mellitus with hyperglycemia: Secondary | ICD-10-CM

## 2019-07-09 DIAGNOSIS — Z9049 Acquired absence of other specified parts of digestive tract: Secondary | ICD-10-CM | POA: Diagnosis not present

## 2019-07-09 DIAGNOSIS — M2041 Other hammer toe(s) (acquired), right foot: Secondary | ICD-10-CM | POA: Diagnosis not present

## 2019-07-09 DIAGNOSIS — Z483 Aftercare following surgery for neoplasm: Secondary | ICD-10-CM | POA: Diagnosis not present

## 2019-07-09 DIAGNOSIS — F5104 Psychophysiologic insomnia: Secondary | ICD-10-CM

## 2019-07-09 DIAGNOSIS — Z8616 Personal history of COVID-19: Secondary | ICD-10-CM | POA: Diagnosis not present

## 2019-07-09 DIAGNOSIS — E1142 Type 2 diabetes mellitus with diabetic polyneuropathy: Secondary | ICD-10-CM | POA: Diagnosis not present

## 2019-07-09 DIAGNOSIS — E7849 Other hyperlipidemia: Secondary | ICD-10-CM | POA: Diagnosis not present

## 2019-07-09 DIAGNOSIS — K219 Gastro-esophageal reflux disease without esophagitis: Secondary | ICD-10-CM | POA: Diagnosis not present

## 2019-07-09 DIAGNOSIS — Z87891 Personal history of nicotine dependence: Secondary | ICD-10-CM | POA: Diagnosis not present

## 2019-07-09 MED ORDER — TRAZODONE HCL 50 MG PO TABS: 50.0000 mg | ORAL_TABLET | Freq: Every evening | ORAL | 2 refills | Status: DC | PRN

## 2019-07-09 NOTE — Progress Notes (Addendum)
Subjective:    Patient ID: [name], female    DOB: 24-Jan-1966, 54 y.o.   MRN: [id number]  [name] is a 54 y.o. 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  Consent was obtained for remote visit:  Yes.   Answered questions that patient had about telehealth interaction:  Yes.   I discussed the limitations, risks, security and privacy concerns of performing an evaluation and management service by video/telephone. I also discussed with the patient that there may be a patient responsible charge related to this service. The patient expressed understanding and agreed to proceed.  Patient Location: Home Provider Location: [facility])  History provided by both patient and also daughter [name].  HPI   Follow-up Hospitalization / Skilled Nursing Rehab  Visit today was requested as a follow-up within 30 days of discharge.  Recently discharged home to care of her daughter, [name], and also with [facility] RN services, that have just recently started. They will come out 2 x week currently. Have not offered home nursing aide at this time, but [name] will look into this option to help Karia get in and out of bath and other daily activities.  Her hospital and rehab course is summarized with initial course with lower extremity cellulitis with chronic LE ulcer, and was admitted, had acute drop in hemoglobin then she had GI consultation, had colonoscopy, I dientified obstructing tumor and  ultimately had sigmoid colectomy 05/28/19, dx with stage 1 adenocarcinoma, did not require chemotherapy and that has resolved. Podiatry has managed the ulceration infection of Left foot and she remains non weight bearing with the left foot in protective boot and now using wheelchair for mobility around the house.  During SNF rehab she had COVID19 infection soon after her first dose of vaccine. Next dose is end of March 2021  Regarding, her mental health / schizoaffective disorder / insomnia - she also admits difficulty with daytime sleepiness and dozing off. Reported to even doze off during day while moving her wheelchair. Admits very poor sleep worse lately, difficulty with mind still active thinking, cannot sleep. Taken trazodone in past in hospital. No other sleeping aid Followed by [facility] On Wellbutrin and Abilify currently  Regarding colorectal cancer / stage 1 adenocarcinoma Next f/u with Gen Surgery upcoming and Oncology, will have surveillance No further surgery needed at this time and no chemotherapy Admits some diarrhea post op  Regarding HTN HH reading today 158/80 (average SBP 150-170), HR 102. On average HR 100-120s at rest. Edema in legs, improved on Furosemide 98m daily now She is taking HCTZ 12.530mdaily and [facility] Lisinopril 2029maily [facility] FatUbaldo GlassingDue for re-scheduling. She has had elevated heart rate for while now >6 months, thought to be due to illness and infection and other health conditions.    [facility] 02/05/2019 10/26/2018 07/27/2018  Decreased Interest 0 0 0  Down, Depressed, Hopeless 0 0 0  PHQ - 2 Score 0 0 0  Altered sleeping 3 1 -  Tired, decreased energy 0 1 -  Change in appetite 0 0 -  Feeling bad or failure about yourself  0 0 -  Trouble concentrating 0 0 -  Moving slowly or fidgety/restless 0 0 -  Suicidal thoughts 0 0 -  PHQ-9 Score 3 2 -  Difficult doing work/chores - Not difficult at all -    Social History    Tobacco Use  . Smoking status: Former Smoker    Packs/day: 3.00    Years: 30.00    Pack years: 90.00    Types: Cigarettes    Quit date: 05/02/2009    Years since quitting: 10.1  . Smokeless tobacco: Never Used  . Tobacco comment: Pt reported  quitting in 2011  Substance Use Topics  . Alcohol use: No  . Drug use: No    Review of Systems Per HPI unless specifically indicated above     Objective:    There were no vitals taken for this visit.  Wt Readings from Last 3 Encounters:  06/10/19 260 lb (117.9 kg)  05/28/19 265 lb (120.2 kg)  05/21/19 265 lb (120.2 kg)    Physical Exam   Note examination was completely remotely via video observation objective data only  Gen - well-appearing, no acute distress or apparent pain, comfortable, in wheelchair Heart/Lungs - cannot examine virtually - observed no evidence of coughing or labored breathing. Abd - cannot examine virtually  Skin - face visible today- no rash Neuro - awake, alert, oriented Psych - not anxious appearing   Results for orders placed or performed during the hospital encounter of 05/23/19  SARS CORONAVIRUS 2 (TAT 6-24 HRS) Nasopharyngeal Nasopharyngeal Swab   Specimen: Nasopharyngeal Swab  Result Value Ref Range   SARS Coronavirus 2 NEGATIVE NEGATIVE  GI pathogen panel by PCR, stool   Specimen: Stool  Result Value Ref Range   Plesiomonas shigelloides NOT DETECTED NOT DETECTED   Yersinia enterocolitica NOT DETECTED NOT DETECTED   Vibrio NOT DETECTED NOT DETECTED   Enteropathogenic E coli NOT DETECTED NOT DETECTED   E coli (ETEC) LT/ST NOT DETECTED NOT DETECTED   E coli 6546 by PCR Not applicable NOT DETECTED   Cryptosporidium by PCR DETECTED (A) NOT DETECTED   Entamoeba histolytica NOT DETECTED NOT DETECTED   Adenovirus F 40/41 NOT DETECTED NOT DETECTED   Norovirus GI/GII NOT DETECTED NOT DETECTED   Sapovirus NOT DETECTED NOT DETECTED   Vibrio cholerae NOT DETECTED NOT DETECTED   Campylobacter by PCR  NOT DETECTED NOT DETECTED   Salmonella by PCR NOT DETECTED NOT DETECTED   E coli (STEC) NOT DETECTED NOT DETECTED   Enteroaggregative E coli NOT DETECTED NOT DETECTED   Shigella by PCR NOT DETECTED NOT DETECTED   Cyclospora cayetanensis NOT DETECTED NOT DETECTED   Astrovirus NOT DETECTED NOT DETECTED   G lamblia by PCR NOT DETECTED NOT DETECTED   Rotavirus A by PCR NOT DETECTED NOT DETECTED  C Difficile Quick Screen w PCR reflex   Specimen: STOOL  Result Value Ref Range   C Diff antigen NEGATIVE NEGATIVE   C Diff toxin NEGATIVE NEGATIVE   C Diff interpretation No C. difficile detected.   CULTURE, BLOOD (ROUTINE X 2) w Reflex to ID Panel   Specimen: BLOOD  Result Value Ref Range   Specimen Description BLOOD RIGHT ANTECUBITAL    Special Requests      BOTTLES DRAWN AEROBIC AND ANAEROBIC Blood Culture adequate volume   Culture      NO GROWTH 5 DAYS  Performed at [facility], [address] [place], [place] [address]    Report Status 06/08/2019 FINAL   SARS CORONAVIRUS 2 (TAT 6-24 HRS) Nasopharyngeal Nasopharyngeal Swab   Specimen: Nasopharyngeal Swab  Result Value Ref Range   SARS Coronavirus 2 NEGATIVE NEGATIVE  Basic metabolic panel  Result Value Ref Range   Sodium 129 (L) 135 - 145 mmol/L   Potassium 4.4 3.5 - 5.1 mmol/L   Chloride 95 (L) 98 - 111 mmol/L   CO2 24 22 - 32 mmol/L   Glucose, Bld 161 (H) 70 - 99 mg/dL   BUN 16 6 - 20 mg/dL   Creatinine, Ser 1.44 (H) 0.44 - 1.00 mg/dL   Calcium 9.0 8.9 - 10.3 mg/dL   GFR calc non Af Amer 41 (L) >60 mL/min   GFR calc Af Amer 47 (L) >60 mL/min   Anion gap 10 5 - 15  CBC  Result Value Ref Range   WBC 8.5 4.0 - 10.5 K/uL   RBC 3.42 (L) 3.87 - 5.11 MIL/uL   Hemoglobin 8.2 (L) 12.0 - 15.0 g/dL   HCT 26.5 (L) 36.0 - 46.0 %   MCV 77.5 (L) 80.0 - 100.0 fL   MCH 24.0 (L) 26.0 - 34.0 pg   MCHC 30.9 30.0 - 36.0 g/dL   RDW 16.4 (H) 11.5 - 15.5 %   Platelets 309 150 - 400 K/uL   nRBC 0.0 0.0 - 0.2 %  Lactic acid, plasma   Result Value Ref Range   Lactic Acid, Venous 1.8 0.5 - 1.9 mmol/L  Lactic acid, plasma  Result Value Ref Range   Lactic Acid, Venous 1.6 0.5 - 1.9 mmol/L  Hemoglobin A1c  Result Value Ref Range   Hgb A1c MFr Bld 8.5 (H) 4.8 - 5.6 %   Mean Plasma Glucose 197.25 mg/dL  CBC WITH DIFFERENTIAL  Result Value Ref Range   WBC 8.7 4.0 - 10.5 K/uL   RBC 3.29 (L) 3.87 - 5.11 MIL/uL   Hemoglobin 8.0 (L) 12.0 - 15.0 g/dL   HCT 25.6 (L) 36.0 - 46.0 %   MCV 77.8 (L) 80.0 - 100.0 fL   MCH 24.3 (L) 26.0 - 34.0 pg   MCHC 31.3 30.0 - 36.0 g/dL   RDW 16.3 (H) 11.5 - 15.5 %   Platelets 316 150 - 400 K/uL   nRBC 0.0 0.0 - 0.2 %   Neutrophils Relative % 62 %   Neutro Abs 5.4 1.7 - 7.7 K/uL   Lymphocytes Relative 25 %   Lymphs Abs 2.2 0.7 - 4.0 K/uL   Monocytes Relative 9 %   Monocytes Absolute 0.7 0.1 - 1.0 K/uL   Eosinophils Relative 3 %   Eosinophils Absolute 0.3 0.0 - 0.5 K/uL   Basophils Relative 1 %   Basophils Absolute 0.1 0.0 - 0.1 K/uL   Immature Granulocytes 0 %   Abs Immature Granulocytes 0.03 0.00 - 0.07 K/uL  Glucose, capillary  Result Value Ref Range   Glucose-Capillary 120 (H) 70 - 99 mg/dL   Comment 1 Notify RN   Glucose, capillary  Result Value Ref Range   Glucose-Capillary 165 (H) 70 - 99 mg/dL   Comment 1 Notify RN   Glucose, capillary  Result Value Ref Range   Glucose-Capillary 106 (H) 70 - 99 mg/dL   Comment 1 Notify RN    Comment 2 Document in Chart   CBC  Result Value Ref Range   WBC 8.7 4.0 - 10.5 K/uL   RBC 3.31 (  L) 3.87 - 5.11 MIL/uL   Hemoglobin 7.9 (L) 12.0 - 15.0 g/dL   HCT 26.2 (L) 36.0 - 46.0 %   MCV 79.2 (L) 80.0 - 100.0 fL   MCH 23.9 (L) 26.0 - 34.0 pg   MCHC 30.2 30.0 - 36.0 g/dL   RDW 16.2 (H) 11.5 - 15.5 %   Platelets 306 150 - 400 K/uL   nRBC 0.0 0.0 - 0.2 %  Basic metabolic panel  Result Value Ref Range   Sodium 136 135 - 145 mmol/L   Potassium 4.8 3.5 - 5.1 mmol/L   Chloride 105 98 - 111 mmol/L   CO2 25 22 - 32 mmol/L   Glucose, Bld 129  (H) 70 - 99 mg/dL   BUN 13 6 - 20 mg/dL   Creatinine, Ser 1.13 (H) 0.44 - 1.00 mg/dL   Calcium 9.2 8.9 - 10.3 mg/dL   GFR calc non Af Amer 55 (L) >60 mL/min   GFR calc Af Amer >60 >60 mL/min   Anion gap 6 5 - 15  Glucose, capillary  Result Value Ref Range   Glucose-Capillary 127 (H) 70 - 99 mg/dL  Iron and TIBC  Result Value Ref Range   Iron 31 28 - 170 ug/dL   TIBC 314 250 - 450 ug/dL   Saturation Ratios 10 (L) 10.4 - 31.8 %   UIBC 283 ug/dL  Ferritin  Result Value Ref Range   Ferritin 42 11 - 307 ng/mL  Vitamin B12  Result Value Ref Range   Vitamin B-12 212 180 - 914 pg/mL  Glucose, capillary  Result Value Ref Range   Glucose-Capillary 121 (H) 70 - 99 mg/dL  Glucose, capillary  Result Value Ref Range   Glucose-Capillary 113 (H) 70 - 99 mg/dL  CBC  Result Value Ref Range   WBC 10.0 4.0 - 10.5 K/uL   RBC 3.67 (L) 3.87 - 5.11 MIL/uL   Hemoglobin 8.7 (L) 12.0 - 15.0 g/dL   HCT 29.0 (L) 36.0 - 46.0 %   MCV 79.0 (L) 80.0 - 100.0 fL   MCH 23.7 (L) 26.0 - 34.0 pg   MCHC 30.0 30.0 - 36.0 g/dL   RDW 16.4 (H) 11.5 - 15.5 %   Platelets 399 150 - 400 K/uL   nRBC 0.0 0.0 - 0.2 %  Glucose, capillary  Result Value Ref Range   Glucose-Capillary 131 (H) 70 - 99 mg/dL   Comment 1 Notify RN   Glucose, capillary  Result Value Ref Range   Glucose-Capillary 84 70 - 99 mg/dL  Glucose, capillary  Result Value Ref Range   Glucose-Capillary 92 70 - 99 mg/dL   Comment 1 Notify RN   Vitamin B12  Result Value Ref Range   Vitamin B-12 264 180 - 914 pg/mL  CEA  Result Value Ref Range   CEA 1.2 0.0 - 4.7 ng/mL  Glucose, capillary  Result Value Ref Range   Glucose-Capillary 91 70 - 99 mg/dL  Glucose, capillary  Result Value Ref Range   Glucose-Capillary 99 70 - 99 mg/dL   Comment 1 Notify RN   Glucose, capillary  Result Value Ref Range   Glucose-Capillary 193 (H) 70 - 99 mg/dL   Comment 1 Notify RN   Glucose, capillary  Result Value Ref Range   Glucose-Capillary 117 (H) 70 - 99  mg/dL  Glucose, capillary  Result Value Ref Range   Glucose-Capillary 125 (H) 70 - 99 mg/dL  Glucose, capillary  Result Value Ref Range   Glucose-Capillary  112 (H) 70 - 99 mg/dL   Comment 1 Notify RN   Glucose, capillary  Result Value Ref Range   Glucose-Capillary 109 (H) 70 - 99 mg/dL  Glucose, capillary  Result Value Ref Range   Glucose-Capillary 154 (H) 70 - 99 mg/dL  CBC  Result Value Ref Range   WBC 14.8 (H) 4.0 - 10.5 K/uL   RBC 3.28 (L) 3.87 - 5.11 MIL/uL   Hemoglobin 7.9 (L) 12.0 - 15.0 g/dL   HCT 26.2 (L) 36.0 - 46.0 %   MCV 79.9 (L) 80.0 - 100.0 fL   MCH 24.1 (L) 26.0 - 34.0 pg   MCHC 30.2 30.0 - 36.0 g/dL   RDW 16.3 (H) 11.5 - 15.5 %   Platelets 360 150 - 400 K/uL   nRBC 0.0 0.0 - 0.2 %  Basic metabolic panel  Result Value Ref Range   Sodium 137 135 - 145 mmol/L   Potassium 4.3 3.5 - 5.1 mmol/L   Chloride 106 98 - 111 mmol/L   CO2 20 (L) 22 - 32 mmol/L   Glucose, Bld 115 (H) 70 - 99 mg/dL   BUN 10 6 - 20 mg/dL   Creatinine, Ser 1.17 (H) 0.44 - 1.00 mg/dL   Calcium 8.6 (L) 8.9 - 10.3 mg/dL   GFR calc non Af Amer 52 (L) >60 mL/min   GFR calc Af Amer >60 >60 mL/min   Anion gap 11 5 - 15  Glucose, capillary  Result Value Ref Range   Glucose-Capillary 145 (H) 70 - 99 mg/dL   Comment 1 Notify RN   Glucose, capillary  Result Value Ref Range   Glucose-Capillary 97 70 - 99 mg/dL  Glucose, capillary  Result Value Ref Range   Glucose-Capillary 160 (H) 70 - 99 mg/dL  Glucose, capillary  Result Value Ref Range   Glucose-Capillary 162 (H) 70 - 99 mg/dL  CBC  Result Value Ref Range   WBC 11.2 (H) 4.0 - 10.5 K/uL   RBC 3.47 (L) 3.87 - 5.11 MIL/uL   Hemoglobin 8.3 (L) 12.0 - 15.0 g/dL   HCT 27.7 (L) 36.0 - 46.0 %   MCV 79.8 (L) 80.0 - 100.0 fL   MCH 23.9 (L) 26.0 - 34.0 pg   MCHC 30.0 30.0 - 36.0 g/dL   RDW 16.5 (H) 11.5 - 15.5 %   Platelets 401 (H) 150 - 400 K/uL   nRBC 0.0 0.0 - 0.2 %  Basic metabolic panel  Result Value Ref Range   Sodium 137 135 - 145  mmol/L   Potassium 4.7 3.5 - 5.1 mmol/L   Chloride 105 98 - 111 mmol/L   CO2 23 22 - 32 mmol/L   Glucose, Bld 141 (H) 70 - 99 mg/dL   BUN 10 6 - 20 mg/dL   Creatinine, Ser 0.99 0.44 - 1.00 mg/dL   Calcium 9.3 8.9 - 10.3 mg/dL   GFR calc non Af Amer >60 >60 mL/min   GFR calc Af Amer >60 >60 mL/min   Anion gap 9 5 - 15  Magnesium  Result Value Ref Range   Magnesium 1.7 1.7 - 2.4 mg/dL  Glucose, capillary  Result Value Ref Range   Glucose-Capillary 146 (H) 70 - 99 mg/dL   Comment 1 Notify RN   Glucose, capillary  Result Value Ref Range   Glucose-Capillary 139 (H) 70 - 99 mg/dL   Comment 1 Notify RN    Comment 2 Document in Chart   Glucose, capillary  Result Value Ref Range  Glucose-Capillary 206 (H) 70 - 99 mg/dL   Comment 1 Notify RN    Comment 2 Document in Chart   Glucose, capillary  Result Value Ref Range   Glucose-Capillary 169 (H) 70 - 99 mg/dL   Comment 1 Notify RN    Comment 2 Document in Chart   CBC  Result Value Ref Range   WBC 10.7 (H) 4.0 - 10.5 K/uL   RBC 3.27 (L) 3.87 - 5.11 MIL/uL   Hemoglobin 7.9 (L) 12.0 - 15.0 g/dL   HCT 26.2 (L) 36.0 - 46.0 %   MCV 80.1 80.0 - 100.0 fL   MCH 24.2 (L) 26.0 - 34.0 pg   MCHC 30.2 30.0 - 36.0 g/dL   RDW 16.4 (H) 11.5 - 15.5 %   Platelets 395 150 - 400 K/uL   nRBC 0.0 0.0 - 0.2 %  Basic metabolic panel  Result Value Ref Range   Sodium 135 135 - 145 mmol/L   Potassium 4.7 3.5 - 5.1 mmol/L   Chloride 104 98 - 111 mmol/L   CO2 24 22 - 32 mmol/L   Glucose, Bld 188 (H) 70 - 99 mg/dL   BUN 10 6 - 20 mg/dL   Creatinine, Ser 0.91 0.44 - 1.00 mg/dL   Calcium 9.1 8.9 - 10.3 mg/dL   GFR calc non Af Amer >60 >60 mL/min   GFR calc Af Amer >60 >60 mL/min   Anion gap 7 5 - 15  Magnesium  Result Value Ref Range   Magnesium 1.6 (L) 1.7 - 2.4 mg/dL  Glucose, capillary  Result Value Ref Range   Glucose-Capillary 142 (H) 70 - 99 mg/dL  Glucose, capillary  Result Value Ref Range   Glucose-Capillary 150 (H) 70 - 99 mg/dL    Comment 1 Notify RN   Glucose, capillary  Result Value Ref Range   Glucose-Capillary 151 (H) 70 - 99 mg/dL   Comment 1 Notify RN    Comment 2 Document in Chart   Glucose, capillary  Result Value Ref Range   Glucose-Capillary 186 (H) 70 - 99 mg/dL   Comment 1 Notify RN   Glucose, capillary  Result Value Ref Range   Glucose-Capillary 184 (H) 70 - 99 mg/dL   Comment 1 Notify RN   CBC  Result Value Ref Range   WBC 10.1 4.0 - 10.5 K/uL   RBC 3.13 (L) 3.87 - 5.11 MIL/uL   Hemoglobin 7.6 (L) 12.0 - 15.0 g/dL   HCT 25.0 (L) 36.0 - 46.0 %   MCV 79.9 (L) 80.0 - 100.0 fL   MCH 24.3 (L) 26.0 - 34.0 pg   MCHC 30.4 30.0 - 36.0 g/dL   RDW 16.5 (H) 11.5 - 15.5 %   Platelets 352 150 - 400 K/uL   nRBC 0.0 0.0 - 0.2 %  Basic metabolic panel  Result Value Ref Range   Sodium 138 135 - 145 mmol/L   Potassium 4.5 3.5 - 5.1 mmol/L   Chloride 104 98 - 111 mmol/L   CO2 26 22 - 32 mmol/L   Glucose, Bld 181 (H) 70 - 99 mg/dL   BUN 11 6 - 20 mg/dL   Creatinine, Ser 0.91 0.44 - 1.00 mg/dL   Calcium 9.1 8.9 - 10.3 mg/dL   GFR calc non Af Amer >60 >60 mL/min   GFR calc Af Amer >60 >60 mL/min   Anion gap 8 5 - 15  Magnesium  Result Value Ref Range   Magnesium 1.9 1.7 - 2.4 mg/dL  Glucose, capillary  Result Value Ref Range   Glucose-Capillary 204 (H) 70 - 99 mg/dL  Glucose, capillary  Result Value Ref Range   Glucose-Capillary 194 (H) 70 - 99 mg/dL   Comment 1 Document in Chart   Glucose, capillary  Result Value Ref Range   Glucose-Capillary 165 (H) 70 - 99 mg/dL  Glucose, capillary  Result Value Ref Range   Glucose-Capillary 180 (H) 70 - 99 mg/dL  CBC  Result Value Ref Range   WBC 12.2 (H) 4.0 - 10.5 K/uL   RBC 3.49 (L) 3.87 - 5.11 MIL/uL   Hemoglobin 8.4 (L) 12.0 - 15.0 g/dL   HCT 27.6 (L) 36.0 - 46.0 %   MCV 79.1 (L) 80.0 - 100.0 fL   MCH 24.1 (L) 26.0 - 34.0 pg   MCHC 30.4 30.0 - 36.0 g/dL   RDW 16.7 (H) 11.5 - 15.5 %   Platelets 410 (H) 150 - 400 K/uL   nRBC 0.0 0.0 - 0.2 %    Glucose, capillary  Result Value Ref Range   Glucose-Capillary 205 (H) 70 - 99 mg/dL   Comment 1 Notify RN   Glucose, capillary  Result Value Ref Range   Glucose-Capillary 139 (H) 70 - 99 mg/dL  Glucose, capillary  Result Value Ref Range   Glucose-Capillary 120 (H) 70 - 99 mg/dL  Glucose, capillary  Result Value Ref Range   Glucose-Capillary 179 (H) 70 - 99 mg/dL  CBC with Differential/Platelet  Result Value Ref Range   WBC 14.9 (H) 4.0 - 10.5 K/uL   RBC 3.50 (L) 3.87 - 5.11 MIL/uL   Hemoglobin 8.4 (L) 12.0 - 15.0 g/dL   HCT 27.5 (L) 36.0 - 46.0 %   MCV 78.6 (L) 80.0 - 100.0 fL   MCH 24.0 (L) 26.0 - 34.0 pg   MCHC 30.5 30.0 - 36.0 g/dL   RDW 16.7 (H) 11.5 - 15.5 %   Platelets 425 (H) 150 - 400 K/uL   nRBC 0.0 0.0 - 0.2 %   Neutrophils Relative % 66 %   Neutro Abs 9.9 (H) 1.7 - 7.7 K/uL   Lymphocytes Relative 20 %   Lymphs Abs 3.0 0.7 - 4.0 K/uL   Monocytes Relative 6 %   Monocytes Absolute 0.9 0.1 - 1.0 K/uL   Eosinophils Relative 6 %   Eosinophils Absolute 0.9 (H) 0.0 - 0.5 K/uL   Basophils Relative 1 %   Basophils Absolute 0.1 0.0 - 0.1 K/uL   Immature Granulocytes 1 %   Abs Immature Granulocytes 0.09 (H) 0.00 - 0.07 K/uL  Glucose, capillary  Result Value Ref Range   Glucose-Capillary 191 (H) 70 - 99 mg/dL   Comment 1 Notify RN   Glucose, capillary  Result Value Ref Range   Glucose-Capillary 169 (H) 70 - 99 mg/dL   Comment 1 Notify RN   Glucose, capillary  Result Value Ref Range   Glucose-Capillary 171 (H) 70 - 99 mg/dL   Comment 1 Notify RN   Urinalysis, Routine w reflex microscopic  Result Value Ref Range   Color, Urine STRAW (A) YELLOW   APPearance CLEAR (A) CLEAR   Specific Gravity, Urine 1.008 1.005 - 1.030   pH 6.0 5.0 - 8.0   Glucose, UA NEGATIVE NEGATIVE mg/dL   Hgb urine dipstick NEGATIVE NEGATIVE   Bilirubin Urine NEGATIVE NEGATIVE   Ketones, ur NEGATIVE NEGATIVE mg/dL   Protein, ur NEGATIVE NEGATIVE mg/dL   Nitrite NEGATIVE NEGATIVE    Leukocytes,Ua TRACE (A) NEGATIVE   RBC /  HPF 0-5 0 - 5 RBC/hpf   WBC, UA 0-5 0 - 5 WBC/hpf   Bacteria, UA RARE (A) NONE SEEN   Squamous Epithelial / LPF 0-5 0 - 5   Non Squamous Epithelial PRESENT (A) NONE SEEN  Glucose, capillary  Result Value Ref Range   Glucose-Capillary 155 (H) 70 - 99 mg/dL   Comment 1 Notify RN   CBC with Differential/Platelet  Result Value Ref Range   WBC 15.3 (H) 4.0 - 10.5 K/uL   RBC 3.35 (L) 3.87 - 5.11 MIL/uL   Hemoglobin 7.9 (L) 12.0 - 15.0 g/dL   HCT 26.2 (L) 36.0 - 46.0 %   MCV 78.2 (L) 80.0 - 100.0 fL   MCH 23.6 (L) 26.0 - 34.0 pg   MCHC 30.2 30.0 - 36.0 g/dL   RDW 16.9 (H) 11.5 - 15.5 %   Platelets 391 150 - 400 K/uL   nRBC 0.0 0.0 - 0.2 %   Neutrophils Relative % 64 %   Neutro Abs 9.8 (H) 1.7 - 7.7 K/uL   Lymphocytes Relative 21 %   Lymphs Abs 3.2 0.7 - 4.0 K/uL   Monocytes Relative 6 %   Monocytes Absolute 1.0 0.1 - 1.0 K/uL   Eosinophils Relative 7 %   Eosinophils Absolute 1.1 (H) 0.0 - 0.5 K/uL   Basophils Relative 1 %   Basophils Absolute 0.1 0.0 - 0.1 K/uL   Immature Granulocytes 1 %   Abs Immature Granulocytes 0.09 (H) 0.00 - 0.07 K/uL  Basic metabolic panel  Result Value Ref Range   Sodium 131 (L) 135 - 145 mmol/L   Potassium 4.8 3.5 - 5.1 mmol/L   Chloride 95 (L) 98 - 111 mmol/L   CO2 25 22 - 32 mmol/L   Glucose, Bld 154 (H) 70 - 99 mg/dL   BUN 26 (H) 6 - 20 mg/dL   Creatinine, Ser 1.08 (H) 0.44 - 1.00 mg/dL   Calcium 9.0 8.9 - 10.3 mg/dL   GFR calc non Af Amer 58 (L) >60 mL/min   GFR calc Af Amer >60 >60 mL/min   Anion gap 11 5 - 15  Magnesium  Result Value Ref Range   Magnesium 1.7 1.7 - 2.4 mg/dL  Glucose, capillary  Result Value Ref Range   Glucose-Capillary 155 (H) 70 - 99 mg/dL  Glucose, capillary  Result Value Ref Range   Glucose-Capillary 139 (H) 70 - 99 mg/dL   Comment 1 Notify RN   Glucose, capillary  Result Value Ref Range   Glucose-Capillary 103 (H) 70 - 99 mg/dL   Comment 1 Notify RN   Type and screen    Result Value Ref Range   ABO/RH(D) O NEG    Antibody Screen NEG    Sample Expiration      05/26/2019,2359 Performed at [facility], [address] [place], [place] [address]   Type and screen [place]  Result Value Ref Range   ABO/RH(D) O NEG    Antibody Screen NEG    Sample Expiration      05/31/2019,2359 Performed at [facility], [address], [place],  [address]   Surgical pathology  Result Value Ref Range   SURGICAL PATHOLOGY      SURGICAL PATHOLOGY CASE: [id number] PATIENT: Jerre Simon Surgical Pathology Report     Specimen Submitted: A. Stomach polyp, fundus; cbx B. GEJ; cbx C. Colon mass, sigmoid, 25-30 cm; cbx  Clinical History: Anemia, HEME positive stool, nausea, bloating, diarrhea.  Gastric fundus  polyps; midl gastritis; sigmoid colon mass    DIAGNOSIS: A. STOMACH POLYP, FUNDUS; COLD BIOPSY: - FUNDIC GLAND POLYP. - NEGATIVE FOR H. PYLORI, DYSPLASIA, AND MALIGNANCY.  B. GASTROESOPHAGEAL JUNCTION; COLD BIOPSY: - SQUAMOCOLUMNAR MUCOSA WITH FEATURES OF MILD REFLUX GASTROESOPHAGITIS. - NO INCREASE IN INTRAEPITHELIAL EOSINOPHILS (LESS THAN 2 PER HPF). - NEGATIVE FOR INTESTINAL METAPLASIA, DYSPLASIA, AND MALIGNANCY.  C. COLON MASS, SIGMOID AT 25-30 CM; COLD BIOPSY: - INVASIVE MODERATELY DIFFERENTIATED ADENOCARCINOMA.  Comment: DNA mismatch repair testing will be deferred to the final resection.  The findings were communicated to Dr. Christian Mate at 8:30 a.m. on 05/28/2019  via [facility] secure chat.  GROSS DESCRIPTION: A. Labeled: C BX gastric fundus (stomach) polyp Received: In formalin Tissue fragment(s): 2 Size: 0.4 and 0.6 cm Description: Tan soft tissue fragments Entirely submitted in 1 cassette.  B. Labeled: C BX GE junction, rule out Barrett's Received: In formalin Tissue fragment(s): 2 Size: 0.2 and 0.3 cm Description: Tan soft tissue fragments Entirely submitted in in 1  cassette.  C. Labeled: C BX sigmoid mass (25-30 cm) Received: In formalin Tissue fragment(s): Multiple Size: Aggregate, 2.5 x 1.5 x 0.1 cm Description: Tan soft tissue fragments Entirely submitted in 1 cassette.   Final Diagnosis performed by Allena Napoleon, MD.   Electronically signed 05/28/2019 8:37:39AM The electronic signature indicates that the named Attending Pathologist has evaluated the specimen Technical component performed at [facility], [address], [place], [place] [address] Lab: [phone] Dir: Rush Farmer, MD, MMM  Professional component perfor med at [facility], [facility], [place], [place], [place] [address] Lab: [phone] Dir: Dellia Nims. Reuel Derby, MD   Surgical pathology  Result Value Ref Range   SURGICAL PATHOLOGY      SURGICAL PATHOLOGY * THIS IS AN ADDENDUM REPORT * CASE: [id number] PATIENT: Jerre Simon Surgical Pathology Report *Addendum   Reason for Addendum #1:  DNA Mismatch Repair IHC Results  Specimen Submitted: A. Colon, sigmoid  Clinical History: Sigmoid colon cancer    DIAGNOSIS: A. COLON, SIGMOID; PARTIAL COLECTOMY: - INVASIVE MODERATELY DIFFERENTIATED ADENOCARCINOMA. - TWENTY FOUR LYMPH NODES, NEGATIVE FOR MALIGNANCY (0/24). - TATTOO PIGMENT PRESENT WITHIN LYMPH NODE SECTIONS.  Comment: A minute focus of tumor was identified, which, due to its small size and clefting artifact, appeared as though it may be within a lymphatic vessel. An immunohistochemical study for D2-40 was performed, and is negative. Deeper recut levels show this to be a larger malignant gland, with only focal clefting from the surrounding stroma. Definite lymphovascular invasion is not identified.  CANCER CASE SUMMARY: COLON AND RECTUM Procedure: Sigmoidec tomy Tumor Site: Sigmoid colon Tumor Size: Greatest dimension: 4.0 cm Macroscopic Tumor Perforation: Not identified Histologic Type: Adenocarcinoma Histologic Grade: G2:  Moderately differentiated Tumor Extension: Tumor invades muscularis propria Margins: All margins are uninvolved by invasive carcinoma, high-grade dysplasia/intramucosal adenocarcinoma, and low-grade dysplasia Treatment Effect: No known presurgical therapy Lymphovascular Invasion: Not identified Perineural Invasion: Not identified Tumor Deposits: Not identified Regional Lymph Nodes: Number of Lymph Nodes Involved: 0 Number of Lymph Nodes Examined: 24  Pathologic Stage Classification (pTNM, AJCC 8th Edition): pT2 pN0 TNM Descriptors:  Results of immunohistochemistry for MMR (mismatch repair proteins) will be resulted in an addendum.  IHC slides were prepared by [facility] for Molecular Biology and Pathology, [place], [place]. All controls stained appropriately.  This test was developed and its perform ance characteristics determined by [facility]. It has not been cleared or approved by the [place] Food and Drug Administration. The FDA does not require this test to go  through premarket FDA review. This test is used for clinical purposes. It should not be regarded as investigational or for research. This laboratory is certified under the Clinical Laboratory Improvement Amendments (CLIA) as qualified to perform high complexity clinical laboratory testing.  GROSS DESCRIPTION: A. Labeled: Sigmoid colon Received: In formalin Type of procedure: Sigmoid colon resection Integrity: Previously partially opened with both ends stapled Oriented: Tumor region was tattooed Inked: All margins inked in black Sigmoid colon size: 34.0 cm in length and 8.0 cm in circumference (after formalin fixation) Mesenteric fat: 34.0 cm in length, ranging from 4.5 to 8.5 cm in width, 2.5 cm in thickness Arterial vascular tie: 12.5 cm distance of tumor to vascular tie, 8.0 cm distance of closest large bo wel wall to vascular tie Serosal surface: The visceral peritoneum is smooth and shiny On Opening: In the sigmoid colon  is a polypoid, sessile and ulcerated partially obstructing mass measuring 4.0 x 3.5 x 1.0 cm which is 7 cm from one surgical margin and 23 cm from opposite, 7.5 cm away from radial (mesentery fat) surgical margin. On Section: The tumor grossly invades the muscular propria but not through Tumor perforation: Not present Lymph Nodes: 25 candidates for lymph nodes ranging from 0.2 to 0.8 cm in greatest dimension Additional Abnormalities: Not identified Comments: The specimen is serially photographed  Summary of Sections: 1-distal and proximal surgical margins (perpendicular sections, inked in black) 2-mesentery fat radial surgical margin (perpendicular section inking black) and sigmoid artery tie 3-4-tumor to normal 5-tumor to serosa 6-9-tumor to mesentery fat 10-14-candidates for lymph nodes  Final Diagnosis performed by Allena Napoleon, MD.   Electro nically signed 06/03/2019 12:09:48PM The electronic signature indicates that the named Attending Pathologist has evaluated the specimen Technical component performed at [facility], [address] [place], [place] [address] Lab: [phone] Dir: Rush Farmer, MD, MMM  Professional component performed at [facility], [facility], [address], [place], [place] [address] Lab: [phone] Dir: Dellia Nims. Reuel Derby, MD  ADDENDUM: Immunohistochemistry (IHC) Testing for DNA Mismatch Repair (MMR) Proteins: Results: MLH1: Intact nuclear expression MSH2: Intact nuclear expression MSH6: Intact nuclear expression PMS2: Intact nuclear expression IHC Interpretation: No loss of nuclear expression of MMR proteins: Low probability of MSI-H.  Intact expression of all 4 proteins indicates that MMR enzymes tested are intact but does not entirely exclude Lynch syndrome, as approximately 5% of families have a missense mutation (especially in [place]) that can lead to a nonfunc tional protein with retained antigenicity. If there is strong  clinical suspicion for hereditary cancer, then additional genetic testing can be considered.  Testing was performed on block: A7 IHC slides were prepared by [facility], [place], [place], and interpreted by Dr. Allena Napoleon. All controls stained appropriately.  This test was developed and its performance characteristics determined by [facility]. It has not been cleared or approved by the [place] Food and Drug Administration. The FDA does not require this test to go through premarket FDA review. This test is used for clinical purposes. It should not be regarded as investigational or for research. This laboratory is certified under the Clinical Laboratory Improvement Amendments (CLIA) as qualified to perform high complexity clinical laboratory testing.         Addendum #1 performed by Allena Napoleon, MD.   Electronically signed 06/04/2019 12:28:10PM The electronic signature  indicates that the named Attending Pathologist has evaluated the specimen Technical component performed at [facility], [address] [place], [place] [address] Lab: [phone] Dir: Rush Farmer, MD,  MMM  Professional component performed at [facility], [facility], [place], [place], [place] [address] Lab: [phone] Dir: Dellia Nims. Rubinas, MD   Troponin I (High Sensitivity)  Result Value Ref Range   Troponin I (High Sensitivity) 4 <18 ng/L  Troponin I (High Sensitivity)  Result Value Ref Range   Troponin I (High Sensitivity) 3 <18 ng/L      Assessment & Plan:   Problem List Items Addressed This Visit    Schizoaffective disorder (HCC)   Neuropathic foot ulcer, left, with fat layer exposed ([place])   Essential hypertension - Primary   Relevant Medications   hydrochlorothiazide (HYDRODIURIL) 12.5 MG tablet   lisinopril (ZESTRIL) 20 MG tablet   DM (diabetes mellitus), type 2, 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HCC)   Relevant Medications    lisinopril (ZESTRIL) 20 MG tablet   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HCC)    Other Visit Diagnoses    Psychophysiological insomnia       Relevant Medications   traZODone (DESYREL) 50 MG tablet      #Adenocarcinoma colon Stage 1 S/p sigmoid colectomy, resolved - no chemo Now with surveillance w/ gen surg and oncology follow-up  #Acute on chronic anemia, blood loss Improved in hospital, on oral iron supplement Should continue oral iron wc BID for now, if tolerated or reduce to daily, f/u with Heme/Onc on repeat labs upcoming  #Hypertension Improved edema on furosemide Inadequate HTN control however currently Had history hyperK Will increase HCTZ from 12.'5mg'$  to '25mg'$  daily Continue Furosemide Continue current Lisinopril '20mg'$  daily instead of increasing F/u with Dr Ubaldo Glassing [facility]  #Schizoaffective / Insomnia Follow with [facility] Continue wellbutrin, Abilify ADD new rx Trazodone '50mg'$  nightly PRN can take nightly if prefer She should discuss insomnia with [facility] psychiatry next visit  #Uncontrolled diabetes, with diabetic foot ulcer secondary infection chronic L foot Followed by Podiatry Limited mobility, in boot L foot non weight bearing, uses wheelchair  I will work on completing her PCS forms for medicaid to get these services started.  She already has [facility] PT + RN twice a week. I asked her daughter to call them and ask them about possibly adding on nursing home aide for helping with bathing in and out of tub and other daily activities if able for period of time. Until PCS can start. If they can add this on, I can authorize or sign this order request if need.  She needs increased supervision now with daughter and any assistance possible from home nursing aide and future PCS given she has issues taking her medications and has demonstrated for a while now several years that she is unable to properly take her own medications safely and has issues with non - adherence and not  keeping up with routine hygiene on her own, and difficulty with follow-up keeping doctors appointments.  Follow-up with specialists as planned.   Meds ordered this encounter  Medications  . traZODone (DESYREL) 50 MG tablet    Sig: Take 1 tablet (50 mg total) by mouth at bedtime as needed for sleep.    Dispense:  30 tablet    Refill:  2    Follow up plan: Return in about 3 months (around 10/09/2019) for 3 month follow-up.  Patient verbalizes understanding with the above medical recommendations including the limitation of remote medical advice.  Specific follow-up and call-back criteria were given for patient to follow-up or seek medical care more urgently if needed.  Total duration of direct patient care provided  via video conference: 30 minutes   [name], [facility] 07/09/2019, 2:50 PM

## 2019-07-09 NOTE — Patient Instructions (Addendum)
Thank you for coming to the office today.  Start Trazodone 50mg  nightly as needed for insomnia. Goal to help you sleep better at night can reduce daytime dozing off and sleepiness.  Please notify [facility] about this medication as well.  Double dose Hydrochlorothiazide 12.5mg  x 2 = 25mg  daily - notify us if need new rx for 25mg  tab  Continue Lisinopril 20mg  daily  Please schedule apt with Dr Ubaldo Glassing to follow-up and discuss elevated heart rate.  Stay tuned for [facility] - we will work on ordering this to assist with home daily activities through [place].  Please ask [facility] about if they can offer any nursing aide to help with getting in and out of tub or other daily activity as well. I can sign off on any order if they need [place] to update it.   Please schedule a Follow-up Appointment to: Return in about 3 months (around 10/09/2019) for 3 month follow-up.  If you have any other questions or concerns, please feel free to call the office or send a message through [place]. You may also schedule an earlier appointment if necessary.  Additionally, you may be receiving a survey about your experience at our office within a few days to 1 week by e-mail or mail. We value your feedback.  [name], DO [place]

## 2019-07-11 DIAGNOSIS — Z483 Aftercare following surgery for neoplasm: Secondary | ICD-10-CM | POA: Diagnosis not present

## 2019-07-11 DIAGNOSIS — E11610 Type 2 diabetes mellitus with diabetic neuropathic arthropathy: Secondary | ICD-10-CM | POA: Diagnosis not present

## 2019-07-11 DIAGNOSIS — D62 Acute posthemorrhagic anemia: Secondary | ICD-10-CM | POA: Diagnosis not present

## 2019-07-11 DIAGNOSIS — C187 Malignant neoplasm of sigmoid colon: Secondary | ICD-10-CM | POA: Diagnosis not present

## 2019-07-11 DIAGNOSIS — Z87891 Personal history of nicotine dependence: Secondary | ICD-10-CM | POA: Diagnosis not present

## 2019-07-11 DIAGNOSIS — M6281 Muscle weakness (generalized): Secondary | ICD-10-CM | POA: Diagnosis not present

## 2019-07-11 DIAGNOSIS — E7849 Other hyperlipidemia: Secondary | ICD-10-CM | POA: Diagnosis not present

## 2019-07-11 DIAGNOSIS — I10 Essential (primary) hypertension: Secondary | ICD-10-CM | POA: Diagnosis not present

## 2019-07-11 DIAGNOSIS — M2041 Other hammer toe(s) (acquired), right foot: Secondary | ICD-10-CM | POA: Diagnosis not present

## 2019-07-11 DIAGNOSIS — K219 Gastro-esophageal reflux disease without esophagitis: Secondary | ICD-10-CM | POA: Diagnosis not present

## 2019-07-11 DIAGNOSIS — Z794 Long term (current) use of insulin: Secondary | ICD-10-CM | POA: Diagnosis not present

## 2019-07-11 DIAGNOSIS — Z8631 Personal history of diabetic foot ulcer: Secondary | ICD-10-CM | POA: Diagnosis not present

## 2019-07-11 DIAGNOSIS — Z9049 Acquired absence of other specified parts of digestive tract: Secondary | ICD-10-CM | POA: Diagnosis not present

## 2019-07-11 DIAGNOSIS — E1142 Type 2 diabetes mellitus with diabetic polyneuropathy: Secondary | ICD-10-CM | POA: Diagnosis not present

## 2019-07-11 DIAGNOSIS — E1165 Type 2 diabetes mellitus with hyperglycemia: Secondary | ICD-10-CM | POA: Diagnosis not present

## 2019-07-11 DIAGNOSIS — Z8616 Personal history of covid-19: Secondary | ICD-10-CM | POA: Diagnosis not present

## 2019-07-12 ENCOUNTER — Ambulatory Visit: Payer: Self-pay | Admitting: Pharmacist

## 2019-07-12 DIAGNOSIS — Z8616 Personal history of COVID-19: Secondary | ICD-10-CM | POA: Diagnosis not present

## 2019-07-12 DIAGNOSIS — Z9049 Acquired absence of other specified parts of digestive tract: Secondary | ICD-10-CM | POA: Diagnosis not present

## 2019-07-12 DIAGNOSIS — E1142 Type 2 diabetes mellitus with diabetic polyneuropathy: Secondary | ICD-10-CM | POA: Diagnosis not present

## 2019-07-12 DIAGNOSIS — Z794 Long term (current) use of insulin: Secondary | ICD-10-CM | POA: Diagnosis not present

## 2019-07-12 DIAGNOSIS — Z87891 Personal history of nicotine dependence: Secondary | ICD-10-CM | POA: Diagnosis not present

## 2019-07-12 DIAGNOSIS — Z8631 Personal history of diabetic foot ulcer: Secondary | ICD-10-CM | POA: Diagnosis not present

## 2019-07-12 DIAGNOSIS — M2041 Other hammer toe(s) (acquired), right foot: Secondary | ICD-10-CM | POA: Diagnosis not present

## 2019-07-12 DIAGNOSIS — I10 Essential (primary) hypertension: Secondary | ICD-10-CM | POA: Diagnosis not present

## 2019-07-12 DIAGNOSIS — C187 Malignant neoplasm of sigmoid colon: Secondary | ICD-10-CM | POA: Diagnosis not present

## 2019-07-12 DIAGNOSIS — M6281 Muscle weakness (generalized): Secondary | ICD-10-CM | POA: Diagnosis not present

## 2019-07-12 DIAGNOSIS — Z483 Aftercare following surgery for neoplasm: Secondary | ICD-10-CM | POA: Diagnosis not present

## 2019-07-12 DIAGNOSIS — K219 Gastro-esophageal reflux disease without esophagitis: Secondary | ICD-10-CM | POA: Diagnosis not present

## 2019-07-12 DIAGNOSIS — E11610 Type 2 diabetes mellitus with diabetic neuropathic arthropathy: Secondary | ICD-10-CM | POA: Diagnosis not present

## 2019-07-12 DIAGNOSIS — E1165 Type 2 diabetes mellitus with hyperglycemia: Secondary | ICD-10-CM | POA: Diagnosis not present

## 2019-07-12 DIAGNOSIS — E7849 Other hyperlipidemia: Secondary | ICD-10-CM | POA: Diagnosis not present

## 2019-07-12 DIAGNOSIS — D62 Acute posthemorrhagic anemia: Secondary | ICD-10-CM | POA: Diagnosis not present

## 2019-07-12 NOTE — Chronic Care Management (AMB) (Signed)
  Chronic Care Management   Follow Up Note   07/12/2019 Name: [name] MRN: [id number] DOB: 03-08-1966  Referred by: [name], DO Reason for referral : No chief complaint on file.   [name] is a 54 y.o. year old female who is a primary care patient of [name], DO. The CCM team was consulted for assistance with chronic disease management and care coordination needs.    Receive coordination of care message from patient's PCP.  Was unable to reach patient and daughter via telephone today x 2 and unable to leave a message as no voicemail is setup.   Plan The care management team will reach out to the patient again over the next 14 days.   Harlow Asa, PharmD, [facility] [employer] [phone]

## 2019-07-15 ENCOUNTER — Ambulatory Visit: Payer: Self-pay | Admitting: Pharmacist

## 2019-07-15 ENCOUNTER — Ambulatory Visit: Payer: Self-pay | Admitting: Licensed Clinical Social Worker

## 2019-07-15 ENCOUNTER — Other Ambulatory Visit: Payer: Self-pay | Admitting: Family Medicine

## 2019-07-15 DIAGNOSIS — E7849 Other hyperlipidemia: Secondary | ICD-10-CM | POA: Diagnosis not present

## 2019-07-15 DIAGNOSIS — E1165 Type 2 diabetes mellitus with hyperglycemia: Secondary | ICD-10-CM | POA: Diagnosis not present

## 2019-07-15 DIAGNOSIS — M2041 Other hammer toe(s) (acquired), right foot: Secondary | ICD-10-CM | POA: Diagnosis not present

## 2019-07-15 DIAGNOSIS — I10 Essential (primary) hypertension: Secondary | ICD-10-CM

## 2019-07-15 DIAGNOSIS — C187 Malignant neoplasm of sigmoid colon: Secondary | ICD-10-CM | POA: Diagnosis not present

## 2019-07-15 DIAGNOSIS — IMO0002 Reserved for concepts with insufficient information to code with codable children: Secondary | ICD-10-CM

## 2019-07-15 DIAGNOSIS — Z794 Long term (current) use of insulin: Secondary | ICD-10-CM | POA: Diagnosis not present

## 2019-07-15 DIAGNOSIS — K219 Gastro-esophageal reflux disease without esophagitis: Secondary | ICD-10-CM | POA: Diagnosis not present

## 2019-07-15 DIAGNOSIS — E11610 Type 2 diabetes mellitus with diabetic neuropathic arthropathy: Secondary | ICD-10-CM | POA: Diagnosis not present

## 2019-07-15 DIAGNOSIS — Z8616 Personal history of COVID-19: Secondary | ICD-10-CM | POA: Diagnosis not present

## 2019-07-15 DIAGNOSIS — Z9049 Acquired absence of other specified parts of digestive tract: Secondary | ICD-10-CM | POA: Diagnosis not present

## 2019-07-15 DIAGNOSIS — M6281 Muscle weakness (generalized): Secondary | ICD-10-CM | POA: Diagnosis not present

## 2019-07-15 DIAGNOSIS — D62 Acute posthemorrhagic anemia: Secondary | ICD-10-CM | POA: Diagnosis not present

## 2019-07-15 DIAGNOSIS — E1142 Type 2 diabetes mellitus with diabetic polyneuropathy: Secondary | ICD-10-CM | POA: Diagnosis not present

## 2019-07-15 DIAGNOSIS — E1149 Type 2 diabetes mellitus with other diabetic neurological complication: Secondary | ICD-10-CM

## 2019-07-15 DIAGNOSIS — Z87891 Personal history of nicotine dependence: Secondary | ICD-10-CM | POA: Diagnosis not present

## 2019-07-15 DIAGNOSIS — Z8631 Personal history of diabetic foot ulcer: Secondary | ICD-10-CM | POA: Diagnosis not present

## 2019-07-15 DIAGNOSIS — Z483 Aftercare following surgery for neoplasm: Secondary | ICD-10-CM | POA: Diagnosis not present

## 2019-07-15 MED ORDER — HYDROCHLOROTHIAZIDE 12.5 MG PO TABS: 25.0000 mg | ORAL_TABLET | Freq: Every day | ORAL | 1 refills | Status: DC

## 2019-07-15 NOTE — Chronic Care Management (AMB) (Signed)
  Care Management   Follow Up Note   07/15/2019 Name: [name] MRN: [id number] DOB: 1965-12-18  Referred by: [name], DO Reason for referral : Care Coordination   [name] is a 54 y.o. year old female who is a primary care patient of [name], DO. The care management team was consulted for assistance with care management and care coordination needs.    Review of patient status, including review of consultants reports, relevant laboratory and other test results, and collaboration with appropriate care team members and the patient's provider was performed as part of comprehensive patient evaluation and provision of chronic care management services.    LCSW received missed call and voice message from [facility] last week regarding scheduling her assessment for PCS. LCSW completed return call but was unable to reach [facility] representative. LCSW left a voice message encouraging a return call in order to coordinate care.  [name], BSW, MSW, [facility] Care Management [email or web address] Phone: [phone]

## 2019-07-15 NOTE — Chronic Care Management (AMB) (Signed)
Chronic Care Management   Follow Up Note   07/15/2019 Name: [name] MRN: [id number] DOB: 04/04/1966  Referred by: [name], DO Reason for referral : Chronic Care Management (Caregiver Phone Call)   [name] is a 54 y.o. year old female who is a primary care patient of [name], DO. The CCM team was consulted for assistance with chronic disease management and care coordination needs.  [name] has a past medical history including but not limited to type 2 diabetes, adenocarcinoma of sigmoid colon, hypertension and hyperlipidemia.  I reached out to [name] and [name] by phone today.   Review of patient status, including review of consultants reports, relevant laboratory and other test results, and collaboration with appropriate care team members and the patient's provider was performed as part of comprehensive patient evaluation and provision of chronic care management services.     Objective  Lab Results  Component Value Date   CREATININE 1.08 (H) 06/04/2019   BUN 26 (H) 06/04/2019   NA 131 (L) 06/04/2019   K 4.8 06/04/2019   CL 95 (L) 06/04/2019   CO2 25 06/04/2019   Lab Results  Component Value Date   HGBA1C 8.5 (H) 05/23/2019    BP Readings from Last 3 Encounters:  06/10/19 132/78  06/04/19 114/72  05/22/19 (!) 157/78    Outpatient Encounter Medications as of 07/15/2019  Medication Sig  . acetaminophen (TYLENOL) 325 MG tablet Take 2 tablets (650 mg total) by mouth every 6 (six) hours as needed for mild pain.  . ARIPiprazole (ABILIFY) 10 MG tablet Take 10 mg by mouth at bedtime.   [name] ascorbic acid (VITAMIN C) 500 MG tablet Take 500 mg by mouth daily.  [name] aspirin EC 81 MG tablet Take 81 mg by mouth daily.   [name] atorvastatin (LIPITOR) 40 MG tablet Take 1 tablet (40 mg total) by mouth daily.  [name] buPROPion (WELLBUTRIN XL) 150 MG 24 hr tablet Take 150 mg by mouth every morning.   . Ferrous  Sulfate 27 MG TABS Take 1 tablet by mouth 2 (two) times daily with a meal.   . furosemide (LASIX) 40 MG tablet Take 1 tablet (40 mg total) by mouth daily.  [name] gabapentin (NEURONTIN) 300 MG capsule Take 1 capsule (300 mg total) by mouth at bedtime.  . hydrochlorothiazide (HYDRODIURIL) 12.5 MG tablet Take 25 mg by mouth daily.   . insulin aspart (NOVOLOG) 100 UNIT/ML injection Inject 0-15 Units into the skin 3 (three) times daily with meals.  . Insulin Glargine (LANTUS SOLOSTAR) 100 UNIT/ML Solostar Pen Inject 60 Units into the skin daily before breakfast.  . lisinopril (ZESTRIL) 20 MG tablet Take 1 tablet (20 mg total) by mouth daily.  . metFORMIN (GLUCOPHAGE) 1000 MG tablet Take 1 tablet (1,000 mg total) by mouth 2 (two) times daily with a meal.  . ondansetron (ZOFRAN) 4 MG tablet Take 1 tablet (4 mg total) by mouth every 6 (six) hours. (Patient taking differently: Take 4 mg by mouth every 8 (eight) hours as needed for nausea or vomiting. )  . pantoprazole (PROTONIX) 40 MG tablet Take 1 tablet (40 mg total) by mouth at bedtime.  . vitamin B-12 1000 MCG tablet Take 1 tablet (1,000 mcg total) by mouth daily.  . Lancets (ONETOUCH ULTRASOFT) lancets Use to check blood sugar up to 3 x daily  . ONETOUCH VERIO test strip Check blood sugar up to 5 x daily as needed  . traZODone (DESYREL) 50 MG  tablet Take 1 tablet (50 mg total) by mouth at bedtime as needed for sleep. (Patient not taking: Reported on 07/15/2019)  . [DISCONTINUED] apixaban (ELIQUIS) 2.5 MG TABS tablet Take 1 tablet by mouth 2 (two) times daily.  . [DISCONTINUED] ferrous sulfate 325 (65 FE) MG tablet Take 1 tablet (325 mg total) by mouth every other day. (Patient not taking: Reported on 07/15/2019)  . [DISCONTINUED] ibuprofen (ADVIL) 400 MG tablet Take 1 tablet (400 mg total) by mouth every 6 (six) hours as needed for mild pain (not relieved by tylenol). (Patient not taking: Reported on 07/15/2019)   No facility-administered encounter  medications on file as of 07/15/2019.    Goals Addressed            This Visit's Progress   . PharmD - Medication Management       Current Barriers:  . Financial Barriers . Non adherence to prescribed medication regimen . Lack of BP results for clinical team  Pharmacist Clinical Goal(s):  [name] Over the next 30 days, patient will work with [name] to address needs related to optimization of medication regimen and improvement of medication adherence  Interventions: . Counsel on importance of BP monitoring and control o Reports patient taking: - Furosemide 40 mg once daily - Hydrochlorothiazide 12.5 mg - 2 tablets (25 mg) once daily - Lisinopril 20 mg once daily o Denies checking home BP since increase in HCTZ dose; has home upper arm BP monitor o Encourage to check home BP, keep log and f/u with provider for readings outside of established parameters . Advise to schedule f/u appointment with Cardiologist, Dr. Ubaldo Glassing. Daughter states will call today to schedule . Will mail BP monitoring log as requested by daughter. . Follow up re: iron supplementation o Reports patient taking: OTC iron 27 mg supplement (~ 134 mg ferrous sulfate) once daily o Reports patient has been unable to tolerate (GI side effects) ferrous sulfate 325 mg dose in past o Advise for patient to increase iron supplement to twice daily with meals as instructed by PCP during visit on 3/9 . Follow up regarding start of trazodone for insomnia. Note trazodone prescribed by PCP on 3/9 o States has not yet picked up this Rx. Planning to pick up and have patient start today. o Reports patient has next appointment with [facility] psychiatry in beginning of April . Confirms patient not taking Eliquis  o Note Eliquis 2.5 mg twice daily prescribed by Podiatrist on 1/6.  o Daughter denies having started patient on Eliquis o Per chart, patient instructed to STOP Eliquis when discharged from [facility] on 2/2 . Follow up regarding blood  sugar control and monitoring o Reports patient taking:  - Metformin 1000 mg twice daily - Lantus 60 units daily - Novolog three times daily according to sliding scale from Endocrinologist o Reports recent CBGs ranging ~180-250 o Reports continued difficulty with dietary management particularly due to patient's overnight eating . Discuss importance of medication adherence: Daughter/caregiver reports that she gives patient all doses of oral medications and insulins . Will collaborate with PCP to request new Rx for HCTZ 25 mg once daily to patient's pharmacy  Patient Self Care Activities:  . Self administers medications as prescribed o Using weekly pillbox and calendar as adherence tools . Attends all scheduled provider appointments o Next appointment with Endocrinology: 3/24 o Next appointment with Oncology: 5/11 . Calls pharmacy for medication refills . Calls provider office for new concerns or questions . Patient to check blood sugar as  directed by Endocrinologist and keep log  Please see past updates related to this goal by clicking on the "Past Updates" button in the selected goal         Plan  Telephone follow up appointment with care management team member scheduled for: 3/31 at 2 pm  Harlow Asa, PharmD, [place] [phone]

## 2019-07-15 NOTE — Patient Instructions (Signed)
Thank you allowing the Chronic Care Management Team to be a part of your care! It was a pleasure speaking with you today!     CCM (Chronic Care Management) Team    Noreene Larsson RN, MSN, CCM Nurse Care Coordinator  [phone]   Harlow Asa PharmD  Clinical Pharmacist  [phone]   [name] LCSW Clinical Social Worker [phone]  Visit Information  Goals Addressed            This Visit's Progress   . PharmD - Medication Management       Current Barriers:  . Financial Barriers . Non adherence to prescribed medication regimen . Lack of BP results for clinical team  Pharmacist Clinical Goal(s):  [name] Over the next 30 days, patient will work with [name] to address needs related to optimization of medication regimen and improvement of medication adherence  Interventions: . Counsel on importance of BP monitoring and control o Reports patient taking: - Furosemide 40 mg once daily - Hydrochlorothiazide 12.5 mg - 2 tablets (25 mg) once daily - Lisinopril 20 mg once daily o Denies checking home BP since increase in HCTZ dose; has home upper arm BP monitor o Encourage to check home BP, keep log and f/u with provider for readings outside of established parameters . Advise to schedule f/u appointment with Cardiologist, Dr. Ubaldo Glassing. Daughter states will call today to schedule . Will mail BP monitoring log as requested by daughter. . Follow up re: iron supplementation o Reports patient taking: OTC iron 27 mg supplement (~ 134 mg ferrous sulfate) once daily o Reports patient has been unable to tolerate (GI side effects) ferrous sulfate 325 mg dose in past o Advise for patient to increase iron supplement to twice daily with meals as instructed by PCP during visit on 3/9 . Follow up regarding start of trazodone for insomnia. Note trazodone prescribed by PCP on 3/9 o States has not yet picked up this Rx. Planning to pick up and have patient start today. o Reports patient  has next appointment with [facility] psychiatry in beginning of April . Confirms patient not taking Eliquis  o Note Eliquis 2.5 mg twice daily prescribed by Podiatrist on 1/6.  o Daughter denies having started patient on Eliquis o Per chart, patient instructed to STOP Eliquis when discharged from [facility] on 2/2 . Follow up regarding blood sugar control and monitoring o Reports patient taking:  - Metformin 1000 mg twice daily - Lantus 60 units daily - Novolog three times daily according to sliding scale from Endocrinologist o Reports recent CBGs ranging ~180-250 o Reports continued difficulty with dietary management particularly due to patient's overnight eating . Discuss importance of medication adherence: Daughter/caregiver reports that she gives patient all doses of oral medications and insulins . Will collaborate with PCP to request new Rx for HCTZ 25 mg once daily to patient's pharmacy  Patient Self Care Activities:  . Self administers medications as prescribed o Using weekly pillbox and calendar as adherence tools . Attends all scheduled provider appointments o Next appointment with Endocrinology: 3/24 o Next appointment with Oncology: 5/11 . Calls pharmacy for medication refills . Calls provider office for new concerns or questions . Patient to check blood sugar as directed by Endocrinologist and keep log  Please see past updates related to this goal by clicking on the "Past Updates" button in the selected goal         Patient verbalizes understanding of instructions provided today.   Telephone follow up  appointment with care management team member scheduled for: 3/31 at 2 pm  Harlow Asa, PharmD, [place] [phone]

## 2019-07-16 ENCOUNTER — Other Ambulatory Visit: Payer: Self-pay

## 2019-07-16 ENCOUNTER — Telehealth: Payer: Self-pay | Admitting: Family Medicine

## 2019-07-16 ENCOUNTER — Emergency Department (HOSPITAL_COMMUNITY)
Admission: EM | Admit: 2019-07-16 | Discharge: 2019-07-17 | Disposition: A | Discharge status: Home / Self Care | Payer: Medicare Other | Attending: Emergency Medicine | Admitting: Emergency Medicine

## 2019-07-16 ENCOUNTER — Encounter (HOSPITAL_COMMUNITY): Payer: Self-pay | Admitting: Emergency Medicine

## 2019-07-16 DIAGNOSIS — K219 Gastro-esophageal reflux disease without esophagitis: Secondary | ICD-10-CM | POA: Diagnosis not present

## 2019-07-16 DIAGNOSIS — Z79899 Other long term (current) drug therapy: Secondary | ICD-10-CM | POA: Diagnosis not present

## 2019-07-16 DIAGNOSIS — E119 Type 2 diabetes mellitus without complications: Secondary | ICD-10-CM | POA: Insufficient documentation

## 2019-07-16 DIAGNOSIS — D62 Acute posthemorrhagic anemia: Secondary | ICD-10-CM | POA: Diagnosis not present

## 2019-07-16 DIAGNOSIS — E1165 Type 2 diabetes mellitus with hyperglycemia: Secondary | ICD-10-CM | POA: Diagnosis not present

## 2019-07-16 DIAGNOSIS — Z794 Long term (current) use of insulin: Secondary | ICD-10-CM | POA: Diagnosis not present

## 2019-07-16 DIAGNOSIS — I10 Essential (primary) hypertension: Secondary | ICD-10-CM | POA: Insufficient documentation

## 2019-07-16 DIAGNOSIS — M6281 Muscle weakness (generalized): Secondary | ICD-10-CM | POA: Diagnosis not present

## 2019-07-16 DIAGNOSIS — Z743 Need for continuous supervision: Secondary | ICD-10-CM | POA: Diagnosis not present

## 2019-07-16 DIAGNOSIS — Z8631 Personal history of diabetic foot ulcer: Secondary | ICD-10-CM | POA: Diagnosis not present

## 2019-07-16 DIAGNOSIS — Z85038 Personal history of other malignant neoplasm of large intestine: Secondary | ICD-10-CM | POA: Insufficient documentation

## 2019-07-16 DIAGNOSIS — N179 Acute kidney failure, unspecified: Secondary | ICD-10-CM | POA: Insufficient documentation

## 2019-07-16 DIAGNOSIS — M2041 Other hammer toe(s) (acquired), right foot: Secondary | ICD-10-CM | POA: Diagnosis not present

## 2019-07-16 DIAGNOSIS — Z9049 Acquired absence of other specified parts of digestive tract: Secondary | ICD-10-CM | POA: Diagnosis not present

## 2019-07-16 DIAGNOSIS — E11610 Type 2 diabetes mellitus with diabetic neuropathic arthropathy: Secondary | ICD-10-CM | POA: Diagnosis not present

## 2019-07-16 DIAGNOSIS — C187 Malignant neoplasm of sigmoid colon: Secondary | ICD-10-CM | POA: Diagnosis not present

## 2019-07-16 DIAGNOSIS — E7849 Other hyperlipidemia: Secondary | ICD-10-CM | POA: Diagnosis not present

## 2019-07-16 DIAGNOSIS — E1142 Type 2 diabetes mellitus with diabetic polyneuropathy: Secondary | ICD-10-CM | POA: Diagnosis not present

## 2019-07-16 DIAGNOSIS — R42 Dizziness and giddiness: Secondary | ICD-10-CM | POA: Diagnosis not present

## 2019-07-16 DIAGNOSIS — R197 Diarrhea, unspecified: Secondary | ICD-10-CM | POA: Diagnosis not present

## 2019-07-16 DIAGNOSIS — Z8616 Personal history of COVID-19: Secondary | ICD-10-CM | POA: Diagnosis not present

## 2019-07-16 DIAGNOSIS — Z483 Aftercare following surgery for neoplasm: Secondary | ICD-10-CM | POA: Diagnosis not present

## 2019-07-16 DIAGNOSIS — E860 Dehydration: Secondary | ICD-10-CM | POA: Insufficient documentation

## 2019-07-16 DIAGNOSIS — R000 Tachycardia, unspecified: Secondary | ICD-10-CM | POA: Diagnosis not present

## 2019-07-16 DIAGNOSIS — Z87891 Personal history of nicotine dependence: Secondary | ICD-10-CM | POA: Diagnosis not present

## 2019-07-16 LAB — CBG MONITORING, ED: Glucose-Capillary: 97 mg/dL (ref 70–99)

## 2019-07-16 LAB — COMPREHENSIVE METABOLIC PANEL
ALT: 24 U/L (ref 0–44)
AST: 19 U/L (ref 15–41)
Albumin: 3.5 g/dL (ref 3.5–5.0)
Alkaline Phosphatase: 122 U/L (ref 38–126)
Anion gap: 8 (ref 5–15)
BUN: 39 mg/dL — ABNORMAL HIGH (ref 6–20)
CO2: 15 mmol/L — ABNORMAL LOW (ref 22–32)
Calcium: 10.2 mg/dL (ref 8.9–10.3)
Chloride: 110 mmol/L (ref 98–111)
Creatinine, Ser: 1.71 mg/dL — ABNORMAL HIGH (ref 0.44–1.00)
GFR calc Af Amer: 38 mL/min — ABNORMAL LOW (ref >60)
GFR calc non Af Amer: 33 mL/min — ABNORMAL LOW (ref >60)
Glucose, Bld: 117 mg/dL — ABNORMAL HIGH (ref 70–99)
Potassium: 6.2 mmol/L — ABNORMAL HIGH (ref 3.5–5.1)
Sodium: 133 mmol/L — ABNORMAL LOW (ref 135–145)
Total Bilirubin: 0.5 mg/dL (ref 0.3–1.2)
Total Protein: 7.4 g/dL (ref 6.5–8.1)

## 2019-07-16 LAB — CBC
HCT: 28.3 % — ABNORMAL LOW (ref 36.0–46.0)
Hemoglobin: 8.2 g/dL — ABNORMAL LOW (ref 12.0–15.0)
MCH: 23.7 pg — ABNORMAL LOW (ref 26.0–34.0)
MCHC: 29 g/dL — ABNORMAL LOW (ref 30.0–36.0)
MCV: 81.8 fL (ref 80.0–100.0)
Platelets: 344 10*3/uL (ref 150–400)
RBC: 3.46 MIL/uL — ABNORMAL LOW (ref 3.87–5.11)
RDW: 17.6 % — ABNORMAL HIGH (ref 11.5–15.5)
WBC: 11.4 10*3/uL — ABNORMAL HIGH (ref 4.0–10.5)
nRBC: 0 % (ref 0.0–0.2)

## 2019-07-16 LAB — LIPASE, BLOOD: Lipase: 29 U/L (ref 11–51)

## 2019-07-16 MED ORDER — SODIUM CHLORIDE 0.9% FLUSH: 3.0000 mL | Freq: Once | INTRAVENOUS | Status: DC

## 2019-07-16 NOTE — Telephone Encounter (Signed)
Called and LVM for [name] w/ [facility]. Did not reach, see my message below.  [name], [facility] 07/16/2019, 4:55 PM

## 2019-07-16 NOTE — Telephone Encounter (Signed)
Spoke to [facility] patient has diarrhea denies throwing up but has some nausea and had taken Zofran, she was taking Pepto-Bismol and imodium but was not helping and HR was 105 bpm-108 bpm and has some dizziness, but all other vitals are normal. She has apt scheduled with cardiology but not till 08/13/2019.  Also verbal given to Amy PT for twice a week for 5 week, once a week for 1 week, to improve her strength. Amy was also mentioning patient complaining not feeling good but vitals are normal except HR in 105-108.

## 2019-07-16 NOTE — Telephone Encounter (Signed)
Joelene Millin with [facility] called requesting a call back ASAP [phone].[name]... PT HR is 105 with  Diarrhea for  2 days

## 2019-07-16 NOTE — Telephone Encounter (Signed)
Reviewed chart. Last seen by me 07/09/19 virtually.  I am concerned with her symptoms that she may need lab work to rule out any electrolyte disturbances or other issues maybe dehydration with the diarrhea.  I see that there is an ED visit chart already started for today.  I would agree with ED hospital evaluation, however if needed can follow-up with [place] if need later this week.  [name], [facility] 07/16/2019, 4:40 PM

## 2019-07-16 NOTE — ED Triage Notes (Signed)
Pt arrives to ED from home with EMS with complaints of diarrhea, weakness, and dizziness starting yesterday. Patient states she had diarrhea 20 times yesterday and 10 times today.

## 2019-07-17 ENCOUNTER — Telehealth: Payer: Self-pay | Admitting: Family Medicine

## 2019-07-17 ENCOUNTER — Inpatient Hospital Stay
Admission: EM | Admit: 2019-07-17 | Discharge: 2019-07-19 | DRG: 391 | Disposition: A | Discharge status: Home / Self Care | Payer: Medicare Other | Attending: Internal Medicine | Admitting: Internal Medicine

## 2019-07-17 ENCOUNTER — Emergency Department (HOSPITAL_COMMUNITY): Payer: Medicare Other

## 2019-07-17 DIAGNOSIS — Z794 Long term (current) use of insulin: Secondary | ICD-10-CM | POA: Diagnosis not present

## 2019-07-17 DIAGNOSIS — Z85038 Personal history of other malignant neoplasm of large intestine: Secondary | ICD-10-CM

## 2019-07-17 DIAGNOSIS — D649 Anemia, unspecified: Secondary | ICD-10-CM

## 2019-07-17 DIAGNOSIS — Z833 Family history of diabetes mellitus: Secondary | ICD-10-CM

## 2019-07-17 DIAGNOSIS — R6520 Severe sepsis without septic shock: Secondary | ICD-10-CM

## 2019-07-17 DIAGNOSIS — E1143 Type 2 diabetes mellitus with diabetic autonomic (poly)neuropathy: Secondary | ICD-10-CM | POA: Diagnosis present

## 2019-07-17 DIAGNOSIS — Z8616 Personal history of covid-19: Secondary | ICD-10-CM | POA: Diagnosis not present

## 2019-07-17 DIAGNOSIS — D3501 Benign neoplasm of right adrenal gland: Secondary | ICD-10-CM | POA: Diagnosis not present

## 2019-07-17 DIAGNOSIS — Z8249 Family history of ischemic heart disease and other diseases of the circulatory system: Secondary | ICD-10-CM

## 2019-07-17 DIAGNOSIS — I129 Hypertensive chronic kidney disease with stage 1 through stage 4 chronic kidney disease, or unspecified chronic kidney disease: Secondary | ICD-10-CM | POA: Diagnosis not present

## 2019-07-17 DIAGNOSIS — D62 Acute posthemorrhagic anemia: Secondary | ICD-10-CM | POA: Diagnosis not present

## 2019-07-17 DIAGNOSIS — E119 Type 2 diabetes mellitus without complications: Secondary | ICD-10-CM | POA: Diagnosis not present

## 2019-07-17 DIAGNOSIS — E860 Dehydration: Secondary | ICD-10-CM

## 2019-07-17 DIAGNOSIS — E785 Hyperlipidemia, unspecified: Secondary | ICD-10-CM | POA: Diagnosis not present

## 2019-07-17 DIAGNOSIS — D630 Anemia in neoplastic disease: Secondary | ICD-10-CM | POA: Diagnosis present

## 2019-07-17 DIAGNOSIS — Z20822 Contact with and (suspected) exposure to covid-19: Secondary | ICD-10-CM | POA: Diagnosis not present

## 2019-07-17 DIAGNOSIS — Z79899 Other long term (current) drug therapy: Secondary | ICD-10-CM

## 2019-07-17 DIAGNOSIS — K3184 Gastroparesis: Secondary | ICD-10-CM | POA: Diagnosis present

## 2019-07-17 DIAGNOSIS — N179 Acute kidney failure, unspecified: Secondary | ICD-10-CM

## 2019-07-17 DIAGNOSIS — Z7982 Long term (current) use of aspirin: Secondary | ICD-10-CM

## 2019-07-17 DIAGNOSIS — R197 Diarrhea, unspecified: Secondary | ICD-10-CM

## 2019-07-17 DIAGNOSIS — E1122 Type 2 diabetes mellitus with diabetic chronic kidney disease: Secondary | ICD-10-CM | POA: Diagnosis present

## 2019-07-17 DIAGNOSIS — I959 Hypotension, unspecified: Secondary | ICD-10-CM | POA: Diagnosis not present

## 2019-07-17 DIAGNOSIS — K529 Noninfective gastroenteritis and colitis, unspecified: Secondary | ICD-10-CM | POA: Diagnosis not present

## 2019-07-17 DIAGNOSIS — E278 Other specified disorders of adrenal gland: Secondary | ICD-10-CM | POA: Diagnosis not present

## 2019-07-17 DIAGNOSIS — E8881 Metabolic syndrome: Secondary | ICD-10-CM | POA: Diagnosis present

## 2019-07-17 DIAGNOSIS — Z87891 Personal history of nicotine dependence: Secondary | ICD-10-CM

## 2019-07-17 DIAGNOSIS — E1142 Type 2 diabetes mellitus with diabetic polyneuropathy: Secondary | ICD-10-CM

## 2019-07-17 DIAGNOSIS — E875 Hyperkalemia: Secondary | ICD-10-CM | POA: Diagnosis not present

## 2019-07-17 DIAGNOSIS — Z8619 Personal history of other infectious and parasitic diseases: Secondary | ICD-10-CM | POA: Diagnosis not present

## 2019-07-17 DIAGNOSIS — Z9049 Acquired absence of other specified parts of digestive tract: Secondary | ICD-10-CM | POA: Diagnosis not present

## 2019-07-17 DIAGNOSIS — I10 Essential (primary) hypertension: Secondary | ICD-10-CM | POA: Diagnosis not present

## 2019-07-17 DIAGNOSIS — C187 Malignant neoplasm of sigmoid colon: Secondary | ICD-10-CM | POA: Diagnosis not present

## 2019-07-17 DIAGNOSIS — R000 Tachycardia, unspecified: Secondary | ICD-10-CM | POA: Diagnosis not present

## 2019-07-17 DIAGNOSIS — A419 Sepsis, unspecified organism: Secondary | ICD-10-CM

## 2019-07-17 DIAGNOSIS — E1165 Type 2 diabetes mellitus with hyperglycemia: Secondary | ICD-10-CM | POA: Diagnosis present

## 2019-07-17 DIAGNOSIS — E872 Acidosis: Secondary | ICD-10-CM | POA: Diagnosis not present

## 2019-07-17 DIAGNOSIS — F259 Schizoaffective disorder, unspecified: Secondary | ICD-10-CM | POA: Diagnosis present

## 2019-07-17 DIAGNOSIS — F329 Major depressive disorder, single episode, unspecified: Secondary | ICD-10-CM | POA: Diagnosis present

## 2019-07-17 DIAGNOSIS — U071 COVID-19: Secondary | ICD-10-CM | POA: Diagnosis not present

## 2019-07-17 DIAGNOSIS — N1831 Chronic kidney disease, stage 3a: Secondary | ICD-10-CM | POA: Diagnosis present

## 2019-07-17 LAB — PROCALCITONIN: Procalcitonin: <0.1 ng/mL

## 2019-07-17 LAB — COMPREHENSIVE METABOLIC PANEL
ALT: 27 U/L (ref 0–44)
AST: 28 U/L (ref 15–41)
Albumin: 3.9 g/dL (ref 3.5–5.0)
Alkaline Phosphatase: 113 U/L (ref 38–126)
Anion gap: 10 (ref 5–15)
BUN: 43 mg/dL — ABNORMAL HIGH (ref 6–20)
CO2: 13 mmol/L — ABNORMAL LOW (ref 22–32)
Calcium: 9.9 mg/dL (ref 8.9–10.3)
Chloride: 111 mmol/L (ref 98–111)
Creatinine, Ser: 1.74 mg/dL — ABNORMAL HIGH (ref 0.44–1.00)
GFR calc Af Amer: 38 mL/min — ABNORMAL LOW (ref >60)
GFR calc non Af Amer: 32 mL/min — ABNORMAL LOW (ref >60)
Glucose, Bld: 122 mg/dL — ABNORMAL HIGH (ref 70–99)
Potassium: 6.2 mmol/L — ABNORMAL HIGH (ref 3.5–5.1)
Sodium: 134 mmol/L — ABNORMAL LOW (ref 135–145)
Total Bilirubin: 0.4 mg/dL (ref 0.3–1.2)
Total Protein: 7.4 g/dL (ref 6.5–8.1)

## 2019-07-17 LAB — CBC
HCT: 26.6 % — ABNORMAL LOW (ref 36.0–46.0)
HCT: 24.9 % — ABNORMAL LOW (ref 36.0–46.0)
Hemoglobin: 7.9 g/dL — ABNORMAL LOW (ref 12.0–15.0)
Hemoglobin: 7.4 g/dL — ABNORMAL LOW (ref 12.0–15.0)
MCH: 24.1 pg — ABNORMAL LOW (ref 26.0–34.0)
MCH: 24.3 pg — ABNORMAL LOW (ref 26.0–34.0)
MCHC: 29.7 g/dL — ABNORMAL LOW (ref 30.0–36.0)
MCHC: 29.7 g/dL — ABNORMAL LOW (ref 30.0–36.0)
MCV: 81.1 fL (ref 80.0–100.0)
MCV: 81.9 fL (ref 80.0–100.0)
Platelets: 291 10*3/uL (ref 150–400)
Platelets: 275 10*3/uL (ref 150–400)
RBC: 3.28 MIL/uL — ABNORMAL LOW (ref 3.87–5.11)
RBC: 3.04 MIL/uL — ABNORMAL LOW (ref 3.87–5.11)
RDW: 17.5 % — ABNORMAL HIGH (ref 11.5–15.5)
RDW: 17.5 % — ABNORMAL HIGH (ref 11.5–15.5)
WBC: 10.7 10*3/uL — ABNORMAL HIGH (ref 4.0–10.5)
WBC: 9.7 10*3/uL (ref 4.0–10.5)
nRBC: 0 % (ref 0.0–0.2)
nRBC: 0 % (ref 0.0–0.2)

## 2019-07-17 LAB — URINALYSIS, COMPLETE (UACMP) WITH MICROSCOPIC
Bacteria, UA: NONE SEEN
Bilirubin Urine: NEGATIVE
Glucose, UA: NEGATIVE mg/dL
Hgb urine dipstick: NEGATIVE
Ketones, ur: NEGATIVE mg/dL
Nitrite: NEGATIVE
Protein, ur: NEGATIVE mg/dL
Specific Gravity, Urine: 1.006 (ref 1.005–1.030)
pH: 5 (ref 5.0–8.0)

## 2019-07-17 LAB — CREATININE, SERUM
Creatinine, Ser: 1.72 mg/dL — ABNORMAL HIGH (ref 0.44–1.00)
GFR calc Af Amer: 38 mL/min — ABNORMAL LOW (ref >60)
GFR calc non Af Amer: 33 mL/min — ABNORMAL LOW (ref >60)

## 2019-07-17 LAB — LIPASE, BLOOD: Lipase: 37 U/L (ref 11–51)

## 2019-07-17 LAB — GLUCOSE, CAPILLARY
Glucose-Capillary: 54 mg/dL — ABNORMAL LOW (ref 70–99)
Glucose-Capillary: 191 mg/dL — ABNORMAL HIGH (ref 70–99)

## 2019-07-17 LAB — C DIFFICILE QUICK SCREEN W PCR REFLEX
C Diff antigen: NEGATIVE
C Diff interpretation: NOT DETECTED
C Diff toxin: NEGATIVE

## 2019-07-17 LAB — LACTIC ACID, PLASMA
Lactic Acid, Venous: 1.1 mmol/L (ref 0.5–1.9)
Lactic Acid, Venous: 1.7 mmol/L (ref 0.5–1.9)

## 2019-07-17 MED ORDER — ACETAMINOPHEN 325 MG PO TABS: 650.0000 mg | ORAL_TABLET | Freq: Four times a day (QID) | ORAL | Status: DC | PRN

## 2019-07-17 MED ORDER — SODIUM CHLORIDE 0.9 % IV BOLUS
1000.0000 mL | Freq: Once | INTRAVENOUS | Status: AC
  Administered 2019-07-17: 1000 mL via INTRAVENOUS

## 2019-07-17 MED ORDER — VITAMIN B-12 1000 MCG PO TABS
1000.0000 ug | ORAL_TABLET | Freq: Every day | ORAL | Status: DC
  Administered 2019-07-17 – 2019-07-19 (×3): 1000 ug via ORAL
  Filled 2019-07-17 (×4): qty 1

## 2019-07-17 MED ORDER — HYDROCODONE-ACETAMINOPHEN 5-325 MG PO TABS: 1.0000 | ORAL_TABLET | ORAL | Status: DC | PRN

## 2019-07-17 MED ORDER — ACETAMINOPHEN 650 MG RE SUPP: 650.0000 mg | Freq: Four times a day (QID) | RECTAL | Status: DC | PRN

## 2019-07-17 MED ORDER — ASPIRIN EC 81 MG PO TBEC
81.0000 mg | DELAYED_RELEASE_TABLET | Freq: Every day | ORAL | Status: DC
  Administered 2019-07-17 – 2019-07-19 (×3): 81 mg via ORAL
  Filled 2019-07-17 (×3): qty 1

## 2019-07-17 MED ORDER — PANTOPRAZOLE SODIUM 40 MG PO TBEC
40.0000 mg | DELAYED_RELEASE_TABLET | Freq: Every day | ORAL | Status: DC
  Administered 2019-07-17 – 2019-07-18 (×2): 40 mg via ORAL
  Filled 2019-07-17 (×2): qty 1

## 2019-07-17 MED ORDER — DEXTROSE 50 % IV SOLN
INTRAVENOUS | Status: AC
  Administered 2019-07-17: 50 mL via INTRAVENOUS
  Filled 2019-07-17: qty 50

## 2019-07-17 MED ORDER — DEXTROSE 50 % IV SOLN: 1.0000 | Freq: Once | INTRAVENOUS | Status: AC

## 2019-07-17 MED ORDER — ATORVASTATIN CALCIUM 20 MG PO TABS
40.0000 mg | ORAL_TABLET | Freq: Every day | ORAL | Status: DC
  Administered 2019-07-17 – 2019-07-19 (×3): 40 mg via ORAL
  Filled 2019-07-17 (×3): qty 2

## 2019-07-17 MED ORDER — FERROUS SULFATE 325 (65 FE) MG PO TABS
325.0000 mg | ORAL_TABLET | Freq: Two times a day (BID) | ORAL | Status: DC
  Administered 2019-07-18: 325 mg via ORAL
  Filled 2019-07-17 (×2): qty 1

## 2019-07-17 MED ORDER — INSULIN GLARGINE 100 UNIT/ML ~~LOC~~ SOLN
15.0000 [IU] | Freq: Every day | SUBCUTANEOUS | Status: DC
  Administered 2019-07-18: 15 [IU] via SUBCUTANEOUS
  Filled 2019-07-17 (×4): qty 0.15

## 2019-07-17 MED ORDER — INSULIN ASPART 100 UNIT/ML ~~LOC~~ SOLN
0.0000 [IU] | Freq: Three times a day (TID) | SUBCUTANEOUS | Status: DC
  Administered 2019-07-18 – 2019-07-19 (×3): 2 [IU] via SUBCUTANEOUS
  Filled 2019-07-17 (×3): qty 1

## 2019-07-17 MED ORDER — PATIROMER SORBITEX CALCIUM 8.4 G PO PACK: 8.4000 g | PACK | Freq: Every day | ORAL | Status: DC

## 2019-07-17 MED ORDER — IOHEXOL 9 MG/ML PO SOLN
ORAL | Status: AC
  Filled 2019-07-17: qty 1000

## 2019-07-17 MED ORDER — METRONIDAZOLE IN NACL 5-0.79 MG/ML-% IV SOLN
500.0000 mg | Freq: Three times a day (TID) | INTRAVENOUS | Status: DC
  Administered 2019-07-17 – 2019-07-19 (×5): 500 mg via INTRAVENOUS
  Filled 2019-07-17 (×7): qty 100

## 2019-07-17 MED ORDER — SODIUM CHLORIDE 0.9 % IV SOLN: INTRAVENOUS | Status: DC

## 2019-07-17 MED ORDER — PATIROMER SORBITEX CALCIUM 8.4 G PO PACK
8.4000 g | PACK | Freq: Once | ORAL | Status: AC
  Administered 2019-07-18: 8.4 g via ORAL
  Filled 2019-07-17 (×2): qty 1

## 2019-07-17 MED ORDER — LACTATED RINGERS IV BOLUS (SEPSIS)
1000.0000 mL | Freq: Once | INTRAVENOUS | Status: AC
  Administered 2019-07-17: 1000 mL via INTRAVENOUS

## 2019-07-17 MED ORDER — DEXTROSE 50 % IV SOLN
1.0000 | Freq: Once | INTRAVENOUS | Status: AC
  Administered 2019-07-17: 50 mL via INTRAVENOUS
  Filled 2019-07-17: qty 50

## 2019-07-17 MED ORDER — INSULIN ASPART 100 UNIT/ML ~~LOC~~ SOLN
10.0000 [IU] | Freq: Once | SUBCUTANEOUS | Status: AC
  Administered 2019-07-17: 10 [IU] via INTRAVENOUS
  Filled 2019-07-17: qty 1

## 2019-07-17 MED ORDER — METRONIDAZOLE 500 MG PO TABS: 500.0000 mg | ORAL_TABLET | Freq: Two times a day (BID) | ORAL | 0 refills | Status: DC

## 2019-07-17 MED ORDER — ONDANSETRON HCL 4 MG PO TABS: 4.0000 mg | ORAL_TABLET | Freq: Four times a day (QID) | ORAL | Status: DC | PRN

## 2019-07-17 MED ORDER — ARIPIPRAZOLE 10 MG PO TABS
10.0000 mg | ORAL_TABLET | Freq: Every day | ORAL | Status: DC
  Administered 2019-07-17 – 2019-07-18 (×2): 10 mg via ORAL
  Filled 2019-07-17 (×3): qty 1

## 2019-07-17 MED ORDER — BUPROPION HCL ER (XL) 150 MG PO TB24
150.0000 mg | ORAL_TABLET | Freq: Every morning | ORAL | Status: DC
  Administered 2019-07-18 – 2019-07-19 (×2): 150 mg via ORAL
  Filled 2019-07-17 (×2): qty 1

## 2019-07-17 MED ORDER — ONDANSETRON HCL 4 MG/2ML IJ SOLN: 4.0000 mg | Freq: Four times a day (QID) | INTRAMUSCULAR | Status: DC | PRN

## 2019-07-17 MED ORDER — SODIUM CHLORIDE 0.9 % IV SOLN
2.0000 g | INTRAVENOUS | Status: DC
  Administered 2019-07-17 – 2019-07-18 (×2): 2 g via INTRAVENOUS
  Filled 2019-07-17: qty 20
  Filled 2019-07-17: qty 2
  Filled 2019-07-17: qty 20

## 2019-07-17 MED ORDER — ENOXAPARIN SODIUM 40 MG/0.4ML ~~LOC~~ SOLN
40.0000 mg | SUBCUTANEOUS | Status: DC
  Administered 2019-07-17 – 2019-07-18 (×2): 40 mg via SUBCUTANEOUS
  Filled 2019-07-17 (×2): qty 0.4

## 2019-07-17 MED ORDER — CALCIUM GLUCONATE-NACL 1-0.675 GM/50ML-% IV SOLN
1.0000 g | Freq: Once | INTRAVENOUS | Status: AC
  Administered 2019-07-17: 1000 mg via INTRAVENOUS
  Filled 2019-07-17: qty 50

## 2019-07-17 NOTE — ED Notes (Signed)
Pt assisted to and from commode.

## 2019-07-17 NOTE — Progress Notes (Signed)
CODE SEPSIS - PHARMACY COMMUNICATION  **Broad Spectrum Antibiotics should be administered within 1 hour of Sepsis diagnosis**  Time Code Sepsis Called/Page Received: 2036  Antibiotics Ordered: Rocephin,Flagyl  Time of 1st antibiotic administration: 2218  Additional action taken by pharmacy: none  If necessary, Name of Provider/Nurse Contacted: n/a   Pearla Dubonnet ,PharmD Clinical Pharmacist  07/17/2019  10:28 PM

## 2019-07-17 NOTE — ED Notes (Signed)
Pt back from CT without incidence

## 2019-07-17 NOTE — H&P (Signed)
History and Physical    [name] KTG:[id number] DOB: [birth year] DOA: 07/17/2019  PCP: [name], DO   Patient coming from:home  I have personally briefly reviewed patient's old medical records in [place]  Chief Complaint: diarrhea  HPI: [name] is a 54 y.o. 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  In at [facility] emergency room yesterday she received IV fluids and discharged on metronidazole.  Her C. difficile returned negative and GI panel still pending.  She had a CT abdomen and pelvis at [facility] that showed expected postsurgical changes and fluid-filled colon as can be seen with diarrheal illness.  Some focal stranding was seen in the proximal segment of IMA and while possibly of postsurgical change, CT angiography of the abdomen and pelvis was recommended for any concern for vascular issue.  Patient states that 2 days ago she was having a bowel movement every 20 minutes and it has decreased some but she still going over 10 times a day.  Her diarrhea is watery and nonbloody.  She has an occasional left lower quadrant abdominal discomfort that is brief and intermittent, unrelated to ingestion of food stuff or having a bowel movement and she feels it mostly when she bends over.  It is of low to moderate intensity and nonradiating.  He has nausea with decreased appetite but no vomiting. She also has associated generalized weakness which she stated has been ongoing since her surgery in January but has recently become worse with the diarrhea.  ED Course: On arrival in the emergency room she was afebrile, tachycardic at 108.  Blood pressure was initially 428/76 with  systolic blood pressure falling to as low as 70 during her time in the emergency room but rebounding with IV fluids to a soft systolic of 811.  On her blood work white cell count was 10,700, hemoglobin 7.9 which appears to be her baseline (7.9-8.2).  Creatinine was 1.74 up from baseline of 0.98.  Potassium was elevated at 6.2. Review of Systems: As per HPI otherwise 10 point review of systems negative.    Past Medical History:  Diagnosis Date  . Depression   . Diabetes mellitus without complication ([place])   . Hyperlipidemia   . Hypertension   . Neuromuscular disorder ([place])   . Schizo affective schizophrenia ([place])    abilify and pazil    Past Surgical History:  Procedure Laterality Date  . CHOLECYSTECTOMY    . COLONOSCOPY WITH PROPOFOL N/A 05/26/2019   Procedure: COLONOSCOPY WITH PROPOFOL;  Surgeon: Toledo, Benay Pike, MD;  Location: [facility] ENDOSCOPY;  Service: Gastroenterology;  Laterality: N/A;  . ESOPHAGOGASTRODUODENOSCOPY (EGD) WITH PROPOFOL N/A 05/26/2019   Procedure: ESOPHAGOGASTRODUODENOSCOPY (EGD) WITH PROPOFOL;  Surgeon: Toledo, Benay Pike, MD;  Location: [facility] ENDOSCOPY;  Service: Gastroenterology;  Laterality: N/A;  . IRRIGATION AND DEBRIDEMENT FOOT Right 02/26/2016   Procedure: RIGHT 2ND TOE DEBRIDEMENT;  Surgeon: Samara Deist, DPM;  Location: [facility] ORS;  Service: Podiatry;  Laterality: Right;  . IRRIGATION AND DEBRIDEMENT FOOT Left 12/13/2018   Procedure: IRRIGATION AND DEBRIDEMENT FOOT;  Surgeon: Caroline More, DPM;  Location: [facility] ORS;  Service: Podiatry;  Laterality: Left;     reports that she quit smoking about 10 years ago. Her smoking use included cigarettes. She has  a 90.00 pack-year smoking history. She has never used smokeless tobacco. She reports that she does not drink alcohol or use drugs.  No Known Allergies  Family History  Problem Relation Age of Onset  . Breast cancer Cousin        maternal cousin  . Hypertension Mother   . Hyperthyroidism Mother   . Dementia  Mother   . Prostate cancer Father   . Diabetes Maternal Grandmother   . Hypertension Maternal Grandmother   . Cancer Maternal Grandmother        unknown type  . Diabetes Maternal Grandfather   . Hypertension Maternal Grandfather   . Diabetes Paternal Grandmother   . Hypertension Paternal Grandmother   . Diabetes Paternal Grandfather   . Hypertension Paternal Grandfather      Prior to Admission medications   Medication Sig Start Date End Date Taking? Authorizing Provider  acetaminophen (TYLENOL) 325 MG tablet Take 2 tablets (650 mg total) by mouth every 6 (six) hours as needed for mild pain. 06/04/19   Nicole Kindred A, DO  ARIPiprazole (ABILIFY) 10 MG tablet Take 10 mg by mouth at bedtime.     [provider]  ascorbic acid (VITAMIN C) 500 MG tablet Take 500 mg by mouth daily.    [provider]  aspirin EC 81 MG tablet Take 81 mg by mouth daily.     [provider]  atorvastatin (LIPITOR) 40 MG tablet Take 1 tablet (40 mg total) by mouth daily. 02/20/19   Karamalegos, Devonne Doughty, DO  buPROPion (WELLBUTRIN XL) 150 MG 24 hr tablet Take 150 mg by mouth every morning.     [provider]  Ferrous Sulfate 27 MG TABS Take 1 tablet by mouth 2 (two) times daily with a meal.     [provider]  furosemide (LASIX) 40 MG tablet Take 1 tablet (40 mg total) by mouth daily. 06/04/19 06/03/20  Nicole Kindred A, DO  gabapentin (NEURONTIN) 300 MG capsule Take 1 capsule (300 mg total) by mouth at bedtime. 06/04/19   Ezekiel Slocumb, DO  hydrochlorothiazide (HYDRODIURIL) 12.5 MG tablet Take 2 tablets (25 mg total) by mouth daily. 07/15/19   Karamalegos, Devonne Doughty, DO  insulin aspart (NOVOLOG) 100 UNIT/ML injection Inject 0-15 Units into the skin 3 (three) times daily with meals. 06/04/19   Ezekiel Slocumb, DO  Insulin Glargine (LANTUS SOLOSTAR) 100 UNIT/ML Solostar Pen Inject 60 Units into the skin daily before breakfast. 06/04/19   Ezekiel Slocumb, DO  Lancets  [facility] ULTRASOFT) lancets Use to check blood sugar up to 3 x daily 03/18/19   Karamalegos, Devonne Doughty, DO  lisinopril (ZESTRIL) 20 MG tablet Take 1 tablet (20 mg total) by mouth daily. 07/09/19   Karamalegos, Devonne Doughty, DO  metFORMIN (GLUCOPHAGE) 1000 MG tablet Take 1 tablet (1,000 mg total) by mouth 2 (two) times daily with a meal. 02/20/19   Karamalegos, Devonne Doughty, DO  metroNIDAZOLE (FLAGYL) 500 MG tablet Take 1 tablet (500 mg total) by mouth 2 (two) times daily. 07/17/19   Horton, Barbette Hair, MD  ondansetron (ZOFRAN) 4 MG tablet Take 1 tablet (4 mg total) by mouth every 6 (six) hours. Patient taking differently: Take 4 mg by mouth every 8 (eight) hours as needed for nausea or vomiting.  05/16/19   Wurst, [place], PA-C  ONETOUCH VERIO test strip Check blood sugar up to 5 x daily as needed 04/17/19   [name], DO  pantoprazole (PROTONIX) 40 MG tablet Take 1  tablet (40 mg total) by mouth at bedtime. 06/04/19   Ezekiel Slocumb, DO  traZODone (DESYREL) 50 MG tablet Take 1 tablet (50 mg total) by mouth at bedtime as needed for sleep. 07/09/19   Karamalegos, Devonne Doughty, DO  vitamin B-12 1000 MCG tablet Take 1 tablet (1,000 mcg total) by mouth daily. 06/05/19   Ezekiel Slocumb, DO    Physical Exam: Vitals:   07/17/19 1602 07/17/19 2015  BP: 120/66 (!) 109/52  Pulse: (!) 108 (!) 102  Resp: 18   Temp: 98.1 F (36.7 C)   TempSrc: Oral   SpO2: 100% 100%  Weight: 118 kg   Height: 5\' 8"  (1.727 m)      Vitals:   07/17/19 1602 07/17/19 2015  BP: 120/66 (!) 109/52  Pulse: (!) 108 (!) 102  Resp: 18   Temp: 98.1 F (36.7 C)   TempSrc: Oral   SpO2: 100% 100%  Weight: 118 kg   Height: 5\' 8"  (1.727 m)     Constitutional: Alert and awake, oriented x3, not in any acute distress. Eyes: PERLA, EOMI, irises appear normal, anicteric sclera,  ENMT: external ears and nose appear normal, normal hearing             Lips appears normal, oropharynx mucosa, tongue, posterior pharynx  appear normal  Neck: neck appears normal, no masses, normal ROM, no thyromegaly, no JVD  CVS: S1-S2 clear, no murmur rubs or gallops,  , no carotid bruits, pedal pulses palpable, No LE edema Respiratory:  clear to auscultation bilaterally, no wheezing, rales or rhonchi. Respiratory effort normal. No accessory muscle use.  Abdomen: soft nontender, nondistended, normal bowel sounds, no hepatosplenomegaly, no hernias Musculoskeletal: : no cyanosis, clubbing , no contractures or atrophy.  Left foot in a boot Neuro: Cranial nerves II-XII intact, sensation, reflexes normal, strength Psych: judgement and insight appear normal, stable mood and affect,  Skin: no rashes or lesions or ulcers, no induration or nodules   Labs on Admission: I have personally reviewed following labs and imaging studies  CBC: Recent Labs  Lab 07/16/19 1743 07/17/19 1612  WBC 11.4* 10.7*  HGB 8.2* 7.9*  HCT 28.3* 26.6*  MCV 81.8 81.1  PLT 344 732   Basic Metabolic Panel: Recent Labs  Lab 07/16/19 1743 07/17/19 1612  NA 133* 134*  K 6.2* 6.2*  CL 110 111  CO2 15* 13*  GLUCOSE 117* 122*  BUN 39* 43*  CREATININE 1.71* 1.74*  CALCIUM 10.2 9.9   GFR: Estimated Creatinine Clearance: 49.3 mL/min (A) (by C-G formula based on SCr of 1.74 mg/dL (H)). Liver Function Tests: Recent Labs  Lab 07/16/19 1743 07/17/19 1612  AST 19 28  ALT 24 27  ALKPHOS 122 113  BILITOT 0.5 0.4  PROT 7.4 7.4  ALBUMIN 3.5 3.9   Recent Labs  Lab 07/16/19 1743 07/17/19 1612  LIPASE 29 37   No results for input(s): AMMONIA in the last 168 hours. Coagulation Profile: No results for input(s): INR, PROTIME in the last 168 hours. Cardiac Enzymes: No results for input(s): CKTOTAL, CKMB, CKMBINDEX, TROPONINI in the last 168 hours. BNP (last 3 results) No results for input(s): PROBNP in the last 8760 hours. HbA1C: No results for input(s): HGBA1C in the last 72 hours. CBG: Recent Labs  Lab 07/16/19 2209 07/17/19 2018    GLUCAP 97 54*   Lipid Profile: No results for input(s): CHOL, HDL, LDLCALC, TRIG, CHOLHDL, LDLDIRECT in the last 72 hours. Thyroid Function Tests: No results for input(s): TSH,  T4TOTAL, FREET4, T3FREE, THYROIDAB in the last 72 hours. Anemia Panel: No results for input(s): VITAMINB12, FOLATE, FERRITIN, TIBC, IRON, RETICCTPCT in the last 72 hours. Urine analysis:    Component Value Date/Time   COLORURINE STRAW (A) 07/17/2019 1953   APPEARANCEUR CLEAR (A) 07/17/2019 1953   APPEARANCEUR Clear 08/19/2012 1204   LABSPEC 1.006 07/17/2019 1953   LABSPEC 1.015 08/19/2012 1204   PHURINE 5.0 07/17/2019 1953   GLUCOSEU NEGATIVE 07/17/2019 1953   GLUCOSEU >=500 08/19/2012 1204   HGBUR NEGATIVE 07/17/2019 1953   BILIRUBINUR NEGATIVE 07/17/2019 1953   BILIRUBINUR Negative 12/13/2017 1533   BILIRUBINUR Negative 08/19/2012 1204   KETONESUR NEGATIVE 07/17/2019 1953   PROTEINUR NEGATIVE 07/17/2019 1953   UROBILINOGEN 0.2 12/13/2017 1533   NITRITE NEGATIVE 07/17/2019 1953   LEUKOCYTESUR TRACE (A) 07/17/2019 1953   LEUKOCYTESUR Trace 08/19/2012 1204    Radiological Exams on Admission: CT ABDOMEN PELVIS WO CONTRAST  Result Date: 07/17/2019 CLINICAL DATA:  Diarrhea, weakness and dizziness, history of diabetes and cholecystectomy EXAM: CT ABDOMEN AND PELVIS WITHOUT CONTRAST TECHNIQUE: Multidetector CT imaging of the abdomen and pelvis was performed following the standard protocol without IV contrast. COMPARISON:  CT abdomen pelvis 05/26/2019 FINDINGS: Lower chest: Lung bases are clear. Normal heart size. No pericardial effusion. Small soft tissue nodule contiguous with the dermis, likely dermal inclusion cyst measuring 1 cm in size (3/12) seen along the left posterior chest wall, unchanged from prior. Hepatobiliary: No focal liver abnormality is seen. Patient is post cholecystectomy. Slight prominence of the biliary tree likely related to reservoir effect. No calcified intraductal gallstones. Pancreas:  Unremarkable. No pancreatic ductal dilatation or surrounding inflammatory changes. Spleen: Normal in size without focal abnormality. Adrenals/Urinary Tract: 1.8 cm nodule in the body of the left adrenal gland measures 14 HU, unchanged from prior. No right adrenal nodule. Stable mild bilateral symmetric perinephric stranding, a nonspecific finding though may correlate with either age or decreased renal function. No visible or contour deforming renal lesions. No urolithiasis or hydronephrosis. Urinary bladder is largely decompressed at the time of exam and therefore poorly evaluated by CT imaging. Stomach/Bowel: Distal esophagus, stomach and duodenal sweep are unremarkable. No small bowel wall thickening or dilatation. No evidence of obstruction. The appendix is not well visualized. No pericecal inflammation to suggest an occult appendicitis. Much of the colon is fluid-filled with a lack of formed stool. Patent colocolonic anastomosis in the level of the sigmoid. Vascular/Lymphatic: Atherosclerotic plaque within the normal caliber aorta. Some focal stranding appears centered upon the proximal segment of the IMA (3/57). Vascular evaluation is limited in the absence of contrast media though feasibly may be postoperative given history of recent colonic resection. No other acute vascular abnormality is seen. Few reactive lymph nodes in the adjacent retroperitoneum. No pathologically enlarged abdominopelvic nodes. Reproductive: Anteverted uterus. Stable appearance of a 4 x 3.6 x 3.1 cm dermoid in the right adnexa. No new or concerning adnexal lesions. Other: No free air or free fluid. No bowel containing hernia. Linear soft tissue stranding in the right lower quadrant extending to the abdominal wall has an appearance most compatible with port placement. Correlate for history of laparoscopic intervention. Musculoskeletal: No acute osseous abnormality or suspicious osseous lesion. IMPRESSION: 1. Expected postsurgical changes  following sigmoid resection. No evidence of obstruction at the anastomotic site in the pelvis. 2. Much of the colon is fluid-filled with a lack of formed stool. This finding can be seen with a diarrheal illness. 3. Some focal stranding appears centered upon the proximal segment  of the IMA. Vascular evaluation is limited in the absence of contrast media. This may be a postoperative change given recent sigmoid resection however if there is clinical concern for a vascular issue, consider CT angiography of the abdomen and pelvis. 4. Stable appearance of a 4 cm right adnexal dermoid. 5. Stable 1.8 cm left adrenal nodule. 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 6. Stable mild bilateral perinephric stranding, a nonspecific finding though may correlate with either age or decreased renal function. 7. Aortic Atherosclerosis (ICD10-I70.0). Electronically Signed   By: Lovena Le M.D.   On: 07/17/2019 05:41    EKG: Independently reviewed.   Assessment/Plan       Severe sepsis (HCC)   Acute diarrhea   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HCC)   S/P partial colectomy -Patient presented with a 3-day history of protracted diarrhea up to 20 times a day, with tachycardia, hypotension, acute kidney injury -CT abdomen and pelvis done in the emergency room the day prior was mostly consistent with acute diarrheal illness but with recommendation for further evaluation with CT angiogram with concern for ischemic bowel -Borderline sepsis criteria but will treat as such for now -IV ceftriaxone and metronidazole -IV fluids per sepsis protocol -C. difficile done in the emergency room the day prior was negative.  Follow GI panel done at [facility] on 07/16/2019 -Follow blood cultures    AKI (acute kidney injury) ([place])   Hyperkalemia -Creatinine 1.74, up from 0.98 baseline -Continue IV hydration and monitor renal function -Hold home lisinopril, hydrochlorothiazide and Lasix due to renal function     Essential hypertension -Hold all home antihypertensives due to hypotension. -BP as low as 70/40 in the ER    Schizoaffective disorder (HCC) -Continue home meds    Anemia -Hemoglobin at 7.9 which is close to baseline, 7.9-8.2 -Continue to monitor    Diabetes mellitus with polyneuropathy (HCC) -Low-dose daily Lantus to 15 units as opposed to home dose of 60 units -Insulin sliding scale coverage    DVT prophylaxis: Lovenox  Code Status: full code  Family Communication:  none  Disposition Plan: Back to previous home environment Consults called: none  Status:obs    Athena Masse MD [facility]     07/17/2019, 8:36 PM

## 2019-07-17 NOTE — ED Provider Notes (Signed)
[place] EMERGENCY DEPARTMENT Provider Note   CSN: [id number] Arrival date & time: 07/16/19  1641     History Chief Complaint  Patient presents with  . Dizziness  . Diarrhea    [name] is a 54 y.o. female.  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  Patient reports 2-day history of ongoing watery diarrhea.  She reports multiple episodes throughout the day.  She reports nausea without vomiting.  No significant abdominal pain.  She has not taken anything for her symptoms.  She states that since the diarrhea she has developed dizziness.  Denies room spinning dizziness but describes more lightheadedness.  Denies fevers, cough, shortness of breath, chest pain.  Denies urinary symptoms.  Denies any recent antibiotic use.  Chart reviewed.  Patient with adenocarcinoma resection and partial sigmoid colectomy in January.  No follow-up chemotherapy or radiation indicated.  Past Medical History:  Diagnosis Date  . Depression   . Diabetes mellitus without complication ([place])   . Hyperlipidemia   . Hypertension   . Neuromuscular disorder ([place])   . Schizo affective schizophrenia ([place])    abilify and pazil    Patient Active Problem List   Diagnosis Date Noted  . 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[place]) 05/28/2019  . Anemia   . Hyponatremia 05/23/2019  . GI bleed 05/23/2019  . Neuropathic foot ulcer, left, with fat layer exposed ([place]) 02/05/2019  . Hyperkalemia 02/05/2019  . Morbid obesity ([place]) 05/10/2017  . Long-term use of high-risk medication 05/10/2017  . Hyperlipidemia associated with type 2 diabetes mellitus ([place]) 06/27/2016  . Hammer toe of second toe of right foot 04/20/2016  . Essential hypertension 06/29/2015  . Schizoaffective disorder ([place]) 06/29/2015  . DM  (diabetes mellitus), type 2, 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[place]) 97/06/6376  . Polyneuropathy 04/02/2014    Past Surgical History:  Procedure Laterality Date  . CHOLECYSTECTOMY    . COLONOSCOPY WITH PROPOFOL N/A 05/26/2019   Procedure: COLONOSCOPY WITH PROPOFOL;  Surgeon: Toledo, Benay Pike, MD;  Location: [facility] ENDOSCOPY;  Service: Gastroenterology;  Laterality: N/A;  . ESOPHAGOGASTRODUODENOSCOPY (EGD) WITH PROPOFOL N/A 05/26/2019   Procedure: ESOPHAGOGASTRODUODENOSCOPY (EGD) WITH PROPOFOL;  Surgeon: Toledo, Benay Pike, MD;  Location: [facility] ENDOSCOPY;  Service: Gastroenterology;  Laterality: N/A;  . IRRIGATION AND DEBRIDEMENT FOOT Right 02/26/2016   Procedure: RIGHT 2ND TOE DEBRIDEMENT;  Surgeon: Samara Deist, DPM;  Location: [facility] ORS;  Service: Podiatry;  Laterality: Right;  . IRRIGATION AND DEBRIDEMENT FOOT Left 12/13/2018   Procedure: IRRIGATION AND DEBRIDEMENT FOOT;  Surgeon: Caroline More, DPM;  Location: [facility] ORS;  Service: Podiatry;  Laterality: Left;     OB History    Gravida  2   Para  2   Term  2   Preterm      AB      Living  2     SAB      TAB      Ectopic      Multiple      Live Births              Family History  Problem Relation Age of Onset  . Breast cancer Cousin        maternal cousin  . Hypertension Mother   . Hyperthyroidism Mother   . Dementia Mother   . Prostate cancer Father   . Diabetes  Maternal Grandmother   . Hypertension Maternal Grandmother   . Cancer Maternal Grandmother        unknown type  . Diabetes Maternal Grandfather   . Hypertension Maternal Grandfather   . Diabetes Paternal Grandmother   . Hypertension Paternal Grandmother   . Diabetes Paternal Grandfather   . Hypertension Paternal Grandfather     Social History   Tobacco Use  . Smoking status: Former Smoker    Packs/day: 3.00    Years: 30.00    Pack years: 90.00    Types: Cigarettes    Quit date: 05/02/2009    Years since quitting: 10.2  . Smokeless  tobacco: Never Used  . Tobacco comment: Pt reported  quitting in 2011  Substance Use Topics  . Alcohol use: No  . Drug use: No    Home Medications Prior to Admission medications   Medication Sig Start Date End Date Taking? Authorizing Provider  acetaminophen (TYLENOL) 325 MG tablet Take 2 tablets (650 mg total) by mouth every 6 (six) hours as needed for mild pain. 06/04/19  Yes Nicole Kindred A, DO  ARIPiprazole (ABILIFY) 10 MG tablet Take 10 mg by mouth at bedtime.    Yes [provider]  ascorbic acid (VITAMIN C) 500 MG tablet Take 500 mg by mouth daily.   Yes [provider]  aspirin EC 81 MG tablet Take 81 mg by mouth daily.    Yes [provider]  atorvastatin (LIPITOR) 40 MG tablet Take 1 tablet (40 mg total) by mouth daily. 02/20/19  Yes Karamalegos, Devonne Doughty, DO  buPROPion (WELLBUTRIN XL) 150 MG 24 hr tablet Take 150 mg by mouth every morning.    Yes [provider]  Ferrous Sulfate 27 MG TABS Take 1 tablet by mouth 2 (two) times daily with a meal.    Yes [provider]  furosemide (LASIX) 40 MG tablet Take 1 tablet (40 mg total) by mouth daily. 06/04/19 06/03/20 Yes Nicole Kindred A, DO  gabapentin (NEURONTIN) 300 MG capsule Take 1 capsule (300 mg total) by mouth at bedtime. 06/04/19  Yes Nicole Kindred A, DO  hydrochlorothiazide (HYDRODIURIL) 12.5 MG tablet Take 2 tablets (25 mg total) by mouth daily. 07/15/19  Yes Karamalegos, Devonne Doughty, DO  insulin aspart (NOVOLOG) 100 UNIT/ML injection Inject 0-15 Units into the skin 3 (three) times daily with meals. 06/04/19  Yes Nicole Kindred A, DO  Insulin Glargine (LANTUS SOLOSTAR) 100 UNIT/ML Solostar Pen Inject 60 Units into the skin daily before breakfast. 06/04/19  Yes Nicole Kindred A, DO  lisinopril (ZESTRIL) 20 MG tablet Take 1 tablet (20 mg total) by mouth daily. 07/09/19  Yes Karamalegos, Devonne Doughty, DO  metFORMIN (GLUCOPHAGE) 1000 MG tablet Take 1 tablet (1,000 mg total) by mouth 2 (two)  times daily with a meal. 02/20/19  Yes Karamalegos, [name] J, DO  ondansetron (ZOFRAN) 4 MG tablet Take 1 tablet (4 mg total) by mouth every 6 (six) hours. Patient taking differently: Take 4 mg by mouth every 8 (eight) hours as needed for nausea or vomiting.  05/16/19  Yes Wurst, [place], PA-C  pantoprazole (PROTONIX) 40 MG tablet Take 1 tablet (40 mg total) by mouth at bedtime. 06/04/19  Yes Nicole Kindred A, DO  vitamin B-12 1000 MCG tablet Take 1 tablet (1,000 mcg total) by mouth daily. 06/05/19  Yes Ezekiel Slocumb, DO  Lancets (ONETOUCH ULTRASOFT) lancets Use to check blood sugar up to 3 x daily 03/18/19   [name], DO  metroNIDAZOLE (FLAGYL)  500 MG tablet Take 1 tablet (500 mg total) by mouth 2 (two) times daily. 07/17/19   Sharalyn Lomba, Barbette Hair, MD  [facility] VERIO test strip Check blood sugar up to 5 x daily as needed 04/17/19   [name], DO  traZODone (DESYREL) 50 MG tablet Take 1 tablet (50 mg total) by mouth at bedtime as needed for sleep. 07/09/19   [name], DO    Allergies    Patient has no known allergies.  Review of Systems   Review of Systems  Constitutional: Negative for fever.  Respiratory: Negative for shortness of breath.   Cardiovascular: Negative for chest pain.  Gastrointestinal: Positive for diarrhea and nausea. Negative for abdominal pain, blood in stool and vomiting.  Genitourinary: Negative for dysuria.  Neurological: Positive for dizziness. Negative for headaches.  All other systems reviewed and are negative.   Physical Exam Updated Vital Signs BP 119/66 (BP Location: Right Arm)   Pulse 99   Temp 98.3 F (36.8 C) (Oral)   Resp 18   SpO2 100%   Physical Exam Vitals and nursing note reviewed.  Constitutional:      Appearance: She is well-developed. She is obese. She is not ill-appearing.  HENT:     Head: Normocephalic and atraumatic.     Mouth/Throat:     Mouth: Mucous membranes are dry.  Eyes:      Pupils: Pupils are equal, round, and reactive to light.  Cardiovascular:     Rate and Rhythm: Regular rhythm. Tachycardia present.     Heart sounds: Normal heart sounds.  Pulmonary:     Effort: Pulmonary effort is normal. No respiratory distress.     Breath sounds: No wheezing.  Abdominal:     General: Bowel sounds are normal.     Palpations: Abdomen is soft.     Tenderness: There is no abdominal tenderness. There is no guarding or rebound.     Comments: Multiple laparoscopic incisions about the abdomen, well-healing  Musculoskeletal:     Cervical back: Neck supple.     Right lower leg: No edema.     Left lower leg: No edema.     Comments: Charcot foot left lower extremity  Skin:    General: Skin is warm and dry.  Neurological:     Mental Status: She is alert and oriented to person, place, and time.  Psychiatric:        Mood and Affect: Mood normal.     ED Results / Procedures / Treatments   Labs (all labs ordered are listed, but only abnormal results are displayed) Labs Reviewed  COMPREHENSIVE METABOLIC PANEL - Abnormal; Notable for the following components:      Result Value   Sodium 133 (*)    Potassium 6.2 (*)    CO2 15 (*)    Glucose, Bld 117 (*)    BUN 39 (*)    Creatinine, Ser 1.71 (*)    GFR calc non Af Amer 33 (*)    GFR calc Af Amer 38 (*)    All other components within normal limits  CBC - Abnormal; Notable for the following components:   WBC 11.4 (*)    RBC 3.46 (*)    Hemoglobin 8.2 (*)    HCT 28.3 (*)    MCH 23.7 (*)    MCHC 29.0 (*)    RDW 17.6 (*)    All other components within normal limits  GI PATHOGEN PANEL BY PCR, STOOL  C DIFFICILE QUICK SCREEN  W PCR REFLEX  LIPASE, BLOOD  CBG MONITORING, ED    EKG EKG Interpretation  Date/Time:  Tuesday July 16 2019 17:18:48 EDT Ventricular Rate:  110 PR Interval:  152 QRS Duration: 84 QT Interval:  318 QTC Calculation: 430 R Axis:   92 Text Interpretation: Sinus tachycardia Rightward axis  Septal infarct , age undetermined Abnormal ECG Confirmed by Thayer Jew [phone]) on 07/17/2019 2:04:22 AM   Radiology CT ABDOMEN PELVIS WO CONTRAST  Result Date: 07/17/2019 CLINICAL DATA:  Diarrhea, weakness and dizziness, history of diabetes and cholecystectomy EXAM: CT ABDOMEN AND PELVIS WITHOUT CONTRAST TECHNIQUE: Multidetector CT imaging of the abdomen and pelvis was performed following the standard protocol without IV contrast. COMPARISON:  CT abdomen pelvis 05/26/2019 FINDINGS: Lower chest: Lung bases are clear. Normal heart size. No pericardial effusion. Small soft tissue nodule contiguous with the dermis, likely dermal inclusion cyst measuring 1 cm in size (3/12) seen along the left posterior chest wall, unchanged from prior. Hepatobiliary: No focal liver abnormality is seen. Patient is post cholecystectomy. Slight prominence of the biliary tree likely related to reservoir effect. No calcified intraductal gallstones. Pancreas: Unremarkable. No pancreatic ductal dilatation or surrounding inflammatory changes. Spleen: Normal in size without focal abnormality. Adrenals/Urinary Tract: 1.8 cm nodule in the body of the left adrenal gland measures 14 HU, unchanged from prior. No right adrenal nodule. Stable mild bilateral symmetric perinephric stranding, a nonspecific finding though may correlate with either age or decreased renal function. No visible or contour deforming renal lesions. No urolithiasis or hydronephrosis. Urinary bladder is largely decompressed at the time of exam and therefore poorly evaluated by CT imaging. Stomach/Bowel: Distal esophagus, stomach and duodenal sweep are unremarkable. No small bowel wall thickening or dilatation. No evidence of obstruction. The appendix is not well visualized. No pericecal inflammation to suggest an occult appendicitis. Much of the colon is fluid-filled with a lack of formed stool. Patent colocolonic anastomosis in the level of the sigmoid.  Vascular/Lymphatic: Atherosclerotic plaque within the normal caliber aorta. Some focal stranding appears centered upon the proximal segment of the IMA (3/57). Vascular evaluation is limited in the absence of contrast media though feasibly may be postoperative given history of recent colonic resection. No other acute vascular abnormality is seen. Few reactive lymph nodes in the adjacent retroperitoneum. No pathologically enlarged abdominopelvic nodes. Reproductive: Anteverted uterus. Stable appearance of a 4 x 3.6 x 3.1 cm dermoid in the right adnexa. No new or concerning adnexal lesions. Other: No free air or free fluid. No bowel containing hernia. Linear soft tissue stranding in the right lower quadrant extending to the abdominal wall has an appearance most compatible with port placement. Correlate for history of laparoscopic intervention. Musculoskeletal: No acute osseous abnormality or suspicious osseous lesion. IMPRESSION: 1. Expected postsurgical changes following sigmoid resection. No evidence of obstruction at the anastomotic site in the pelvis. 2. Much of the colon is fluid-filled with a lack of formed stool. This finding can be seen with a diarrheal illness. 3. Some focal stranding appears centered upon the proximal segment of the IMA. Vascular evaluation is limited in the absence of contrast media. This may be a postoperative change given recent sigmoid resection however if there is clinical concern for a vascular issue, consider CT angiography of the abdomen and pelvis. 4. Stable appearance of a 4 cm right adnexal dermoid. 5. Stable 1.8 cm left adrenal nodule. 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 6. Stable mild bilateral perinephric stranding, a nonspecific finding though may correlate  with either age or decreased renal function. 7. Aortic Atherosclerosis (ICD10-I70.0). Electronically Signed   By: Lovena Le M.D.   On: 07/17/2019 05:41    Procedures Procedures (including  critical care time)  Medications Ordered in ED Medications  sodium chloride flush (NS) 0.9 % injection 3 mL (has no administration in time range)  sodium chloride 0.9 % bolus 1,000 mL (0 mLs Intravenous Stopped 07/17/19 0430)  iohexol (OMNIPAQUE) 9 MG/ML oral solution (  Contrast Given 07/17/19 0301)  sodium chloride 0.9 % bolus 1,000 mL (0 mLs Intravenous Stopped 07/17/19 0527)    ED Course  I have reviewed the triage vital signs and the nursing notes.  Pertinent labs & imaging results that were available during my care of the patient were reviewed by me and considered in my medical decision making (see chart for details).  Clinical Course as of Jul 16 628  Wed Jul 17, 2019  0628 On recheck, patient states she feels much better.  She no longer feels dizzy.  She is not orthostatic.  She had 2 episodes of diarrhea while in the emergency room.  These were sent for GI panel and C. difficile.  This is still pending.  We will empirically treat with Flagyl given her recent hospitalization and surgery.   [CH]    Clinical Course User Index [CH] Abiageal Blowe, Barbette Hair, MD   MDM Rules/Calculators/A&P                       Patient presents with diarrhea.  Denies any vomiting.  Denies significant abdominal pain.  Vital signs are largely reassuring with exception of mild tachycardia.  I have reviewed her lab work from triage.  She is slightly hyponatremic with potassium 6.2 and a creatinine of 1.7 which is acutely elevated.  She describes dizziness on exam.  Suspect dehydration given the reported amount of diarrhea.  Patient was given 2 L of fluid.  Given her recent bowel resection, CT was obtained to rule out obstructive process or complication.  CT chest shows postsurgical changes.  There is some inflammation around her vasculature but given that she is not having any abdominal pain,'s feel this is likely related to surgery.  See clinical course above.  Patient states she feels much improved.  GI studies  are pending.  Given recent hospitalization, will cover with Flagyl for C. difficile.  Discussed admission for observation versus discharge.  Patient feels well and is able to tolerate fluids.  Will discharge home with close PCP follow-up for recheck of her creatinine.  Encouraged hydration at home with frequent fluids.  Patient stated understanding and was given strict return precautions.  After history, exam, and medical workup I feel the patient has been appropriately medically screened and is safe for discharge home. Pertinent diagnoses were discussed with the patient. Patient was given return precautions.   Final Clinical Impression(s) / ED Diagnoses Final diagnoses:  Dehydration  Diarrhea, unspecified type  Acute kidney injury ([place])    Rx / DC Orders ED Discharge Orders         Ordered    metroNIDAZOLE (FLAGYL) 500 MG tablet  2 times daily     07/17/19 9622           Merryl Hacker, MD 07/17/19 [phone]

## 2019-07-17 NOTE — ED Notes (Signed)
Son Jeneen Rinks called [phone]

## 2019-07-17 NOTE — Telephone Encounter (Signed)
I reviewed ED visit from yesterday 07/16/19.  They asked [place] to re-check her blood work kidney function within 5 to 7 days.  I have placed future lab orders for her to come to our office to have labs done BMET and CBC on Monday or Tuesday of next week.  Please notify her that she can schedule a Lab Only visit (NON fasting) for Monday or Tuesday or she can do walk in for labs.  We can notify her within 1-2 days after labs if any abnormality.  If they haven't heard back on lab results, they can call or message for results.  [name], [facility] 07/17/2019, 11:05 AM

## 2019-07-17 NOTE — ED Provider Notes (Signed)
[facility] Emergency Department Provider Note ____________________________________________   First MD Initiated Contact with Patient 07/17/19 1741     (approximate)  I have reviewed the triage vital signs and the nursing notes.   HISTORY  Chief Complaint Hypoglycemia, Nausea, and Emesis    HPI [name] is a 54 y.o. female with PMH as noted below who presents primarily with diarrhea over the last several days, persistent course, described as watery and nonbloody.  She states it happens multiple times per day.  In addition, the patient reports worsening generalized weakness and low blood sugars.  She was seen at [facility], ED last night, given fluids, and sent home but states that she has continued to feel worse.  She denies any vomiting or abdominal pain.  Past Medical History:  Diagnosis Date  . Depression   . Diabetes mellitus without complication ([place])   . Hyperlipidemia   . Hypertension   . Neuromuscular disorder ([place])   . Schizo affective schizophrenia ([place])    abilify and pazil    Patient Active Problem List   Diagnosis Date Noted  . Diabetes mellitus with polyneuropathy ([place]) 07/17/2019  . 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[place]) 05/28/2019  . Anemia   . Hyponatremia 05/23/2019  . GI bleed 05/23/2019  . Neuropathic foot ulcer, left, with fat layer exposed ([place]) 02/05/2019  . Hyperkalemia 02/05/2019  . Morbid obesity ([place]) 05/10/2017  . Long-term use of high-risk medication 05/10/2017  . Hyperlipidemia associated with type 2 diabetes mellitus ([place]) 06/27/2016  . Hammer toe of second toe of right foot 04/20/2016  . Essential hypertension 06/29/2015  . Schizoaffective disorder ([place]) 06/29/2015  . DM (diabetes mellitus), type 2, 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[place]) [id number]/63/8466  . Polyneuropathy 04/02/2014    Past Surgical History:  Procedure Laterality Date  . CHOLECYSTECTOMY    . COLONOSCOPY WITH PROPOFOL N/A 05/26/2019     Procedure: COLONOSCOPY WITH PROPOFOL;  Surgeon: Toledo, Benay Pike, MD;  Location: [facility] ENDOSCOPY;  Service: Gastroenterology;  Laterality: N/A;  . ESOPHAGOGASTRODUODENOSCOPY (EGD) WITH PROPOFOL N/A 05/26/2019   Procedure: ESOPHAGOGASTRODUODENOSCOPY (EGD) WITH PROPOFOL;  Surgeon: Toledo, Benay Pike, MD;  Location: [facility] ENDOSCOPY;  Service: Gastroenterology;  Laterality: N/A;  . IRRIGATION AND DEBRIDEMENT FOOT Right 02/26/2016   Procedure: RIGHT 2ND TOE DEBRIDEMENT;  Surgeon: Samara Deist, DPM;  Location: [facility] ORS;  Service: Podiatry;  Laterality: Right;  . IRRIGATION AND DEBRIDEMENT FOOT Left 12/13/2018   Procedure: IRRIGATION AND DEBRIDEMENT FOOT;  Surgeon: Caroline More, DPM;  Location: [facility] ORS;  Service: Podiatry;  Laterality: Left;    Prior to Admission medications   Medication Sig Start Date End Date Taking? Authorizing Provider  acetaminophen (TYLENOL) 325 MG tablet Take 2 tablets (650 mg total) by mouth every 6 (six) hours as needed for mild pain. 06/04/19   Nicole Kindred A, DO  ARIPiprazole (ABILIFY) 10 MG tablet Take 10 mg by mouth at bedtime.     [provider]  ascorbic acid (VITAMIN C) 500 MG tablet Take 500 mg by mouth daily.    [provider]  aspirin EC 81 MG tablet Take 81 mg by mouth daily.     [provider]  atorvastatin (LIPITOR) 40 MG tablet Take 1 tablet (40 mg total) by mouth daily. 02/20/19   Karamalegos, Devonne Doughty, DO  buPROPion (WELLBUTRIN XL) 150 MG 24 hr tablet Take 150 mg by mouth every morning.     [provider]  Ferrous Sulfate 27 MG TABS Take 1 tablet by mouth  2 (two) times daily with a meal.     [provider]  furosemide (LASIX) 40 MG tablet Take 1 tablet (40 mg total) by mouth daily. 06/04/19 06/03/20  Nicole Kindred A, DO  gabapentin (NEURONTIN) 300 MG capsule Take 1 capsule (300 mg total) by mouth at bedtime. 06/04/19   Ezekiel Slocumb, DO  hydrochlorothiazide (HYDRODIURIL) 12.5 MG tablet Take 2 tablets (25 mg  total) by mouth daily. 07/15/19   Karamalegos, Devonne Doughty, DO  insulin aspart (NOVOLOG) 100 UNIT/ML injection Inject 0-15 Units into the skin 3 (three) times daily with meals. 06/04/19   Ezekiel Slocumb, DO  Insulin Glargine (LANTUS SOLOSTAR) 100 UNIT/ML Solostar Pen Inject 60 Units into the skin daily before breakfast. 06/04/19   Ezekiel Slocumb, DO  Lancets [facility] ULTRASOFT) lancets Use to check blood sugar up to 3 x daily 03/18/19   Karamalegos, Devonne Doughty, DO  lisinopril (ZESTRIL) 20 MG tablet Take 1 tablet (20 mg total) by mouth daily. 07/09/19   Karamalegos, Devonne Doughty, DO  metFORMIN (GLUCOPHAGE) 1000 MG tablet Take 1 tablet (1,000 mg total) by mouth 2 (two) times daily with a meal. 02/20/19   Karamalegos, Devonne Doughty, DO  metroNIDAZOLE (FLAGYL) 500 MG tablet Take 1 tablet (500 mg total) by mouth 2 (two) times daily. 07/17/19   Horton, Barbette Hair, MD  ondansetron (ZOFRAN) 4 MG tablet Take 1 tablet (4 mg total) by mouth every 6 (six) hours. Patient taking differently: Take 4 mg by mouth every 8 (eight) hours as needed for nausea or vomiting.  05/16/19   Wurst, [place], PA-C  ONETOUCH VERIO test strip Check blood sugar up to 5 x daily as needed 04/17/19   [name], DO  pantoprazole (PROTONIX) 40 MG tablet Take 1 tablet (40 mg total) by mouth at bedtime. 06/04/19   Ezekiel Slocumb, DO  traZODone (DESYREL) 50 MG tablet Take 1 tablet (50 mg total) by mouth at bedtime as needed for sleep. 07/09/19   Karamalegos, Devonne Doughty, DO  vitamin B-12 1000 MCG tablet Take 1 tablet (1,000 mcg total) by mouth daily. 06/05/19   Ezekiel Slocumb, DO    Allergies Patient has no known allergies.  Family History  Problem Relation Age of Onset  . Breast cancer Cousin        maternal cousin  . Hypertension Mother   . Hyperthyroidism Mother   . Dementia Mother   . Prostate cancer Father   . Diabetes Maternal Grandmother   . Hypertension Maternal Grandmother   . Cancer Maternal Grandmother         unknown type  . Diabetes Maternal Grandfather   . Hypertension Maternal Grandfather   . Diabetes Paternal Grandmother   . Hypertension Paternal Grandmother   . Diabetes Paternal Grandfather   . Hypertension Paternal Grandfather     Social History Social History   Tobacco Use  . Smoking status: Former Smoker    Packs/day: 3.00    Years: 30.00    Pack years: 90.00    Types: Cigarettes    Quit date: 05/02/2009    Years since quitting: 10.2  . Smokeless tobacco: Never Used  . Tobacco comment: Pt reported  quitting in 2011  Substance Use Topics  . Alcohol use: No  . Drug use: No    Review of Systems  Constitutional: No fever/chills.  Positive for weakness. Eyes: No visual changes. ENT: No sore throat. Cardiovascular: Denies chest pain. Respiratory: Denies shortness of breath. Gastrointestinal: No vomiting.  Positive  for diarrhea.  Genitourinary: Negative for dysuria.  Musculoskeletal: Negative for back pain. Skin: Negative for rash. Neurological: Negative for headache.   ____________________________________________   PHYSICAL EXAM:  VITAL SIGNS: ED Triage Vitals [07/17/19 1602]  Enc Vitals Group     BP 120/66     Pulse Rate (!) 108     Resp 18     Temp 98.1 F (36.7 C)     Temp Source Oral     SpO2 100 %     Weight 260 lb 2.3 oz (118 kg)     Height 5\' 8"  (1.727 m)     Head Circumference      Peak Flow      Pain Score 0     Pain Loc      Pain Edu?      Excl. in [place]?     Constitutional: Alert and oriented.  Weak appearing but in no acute distress. Eyes: Conjunctivae are normal.  No scleral icterus. Head: Atraumatic. Nose: No congestion/rhinnorhea. Mouth/Throat: Mucous membranes are dry.   Neck: Normal range of motion.  Cardiovascular: Normal rate, regular rhythm. Grossly normal heart sounds.  Good peripheral circulation. Respiratory: Normal respiratory effort.  No retractions. Lungs CTAB. Gastrointestinal: Soft and nontender. No distention.    Genitourinary: No flank tenderness. Musculoskeletal:  Extremities warm and well perfused.  Neurologic:  Normal speech and language. No gross focal neurologic deficits are appreciated.  Skin:  Skin is warm and dry. No rash noted. Psychiatric: Mood and affect are normal. Speech and behavior are normal.  ____________________________________________   LABS (all labs ordered are listed, but only abnormal results are displayed)  Labs Reviewed  COMPREHENSIVE METABOLIC PANEL - Abnormal; Notable for the following components:      Result Value   Sodium 134 (*)    Potassium 6.2 (*)    CO2 13 (*)    Glucose, Bld 122 (*)    BUN 43 (*)    Creatinine, Ser 1.74 (*)    GFR calc non Af Amer 32 (*)    GFR calc Af Amer 38 (*)    All other components within normal limits  CBC - Abnormal; Notable for the following components:   WBC 10.7 (*)    RBC 3.28 (*)    Hemoglobin 7.9 (*)    HCT 26.6 (*)    MCH 24.1 (*)    MCHC 29.7 (*)    RDW 17.5 (*)    All other components within normal limits  URINALYSIS, COMPLETE (UACMP) WITH MICROSCOPIC - Abnormal; Notable for the following components:   Color, Urine STRAW (*)    APPearance CLEAR (*)    Leukocytes,Ua TRACE (*)    All other components within normal limits  LIPASE, BLOOD  LACTIC ACID, PLASMA  LACTIC ACID, PLASMA  CBG MONITORING, ED   ____________________________________________  EKG  ED ECG REPORT I, Arta Silence, the attending physician, personally viewed and interpreted this ECG.  Date: 07/17/2019 EKG Time: 1607 Rate: 109 Rhythm: Sinus tachycardia QRS Axis: normal Intervals: normal ST/T Wave abnormalities: normal; no peaked T waves Narrative Interpretation: no evidence of acute ischemia  ____________________________________________  RADIOLOGY    ____________________________________________   PROCEDURES  Procedure(s) performed: No  Procedures  Critical Care performed:  No ____________________________________________   INITIAL IMPRESSION / ASSESSMENT AND PLAN / ED COURSE  Pertinent labs & imaging results that were available during my care of the patient were reviewed by me and considered in my medical decision making (see chart for details).  54 year old  female with PMH as noted above presents with persistent diarrhea over the last 3 to 4 days along with increased generalized weakness.  I reviewed the past medical records in epic.  The patient was seen in [facility] ED overnight last night and this morning with the same symptoms.  She had lab work-up showing a worsening creatinine and a potassium of 6.  CT was obtained showing postsurgical changes after sigmoid resection and some focal stranding around the IMA also suggestive of postoperative findings.  The patient received 2 L of fluids and was noted to be feeling better.  She was then discharged home.  However, the patient states that she did not feel well when she was discharged and has continued to feel weak and have diarrhea.  On exam, the patient is somewhat weak appearing but in no acute distress.  She was tachycardic initially with otherwise normal vital signs.  She has dry mucous membranes and appears somewhat dehydrated.  The abdomen is soft and nontender.  Repeat labs show persistent AKI and hyperkalemia, which I suspect is related to dehydration from the diarrhea.  The patient is also anemic but this does not appear to be a significant change from her baseline.  We will give fluids and treat the hyperkalemia with calcium gluconate, insulin, D50.  Given that the lab abnormalities persisted after significant hydration last night, anticipate that the patient will need admission for AKI and hyperkalemia.  Based on the fact that she is not having any abdominal pain or tenderness, there is no clinical evidence for an acute intra-abdominal process and I do not see an indication for repeat  imaging.  ----------------------------------------- 8:16 PM on 07/17/2019 -----------------------------------------  The patient had an episode of hypotension with a blood pressure as low as 70 systolic, and became pale, clammy and diaphoretic appearing.  However after few minutes this resolved without intervention and is most consistent with a vasovagal episode.  I have ordered additional fluids.  I discussed the case with the hospitalist Dr. Damita Dunnings for admission. ____________________________________________   FINAL CLINICAL IMPRESSION(S) / ED DIAGNOSES  Final diagnoses:  None      NEW MEDICATIONS STARTED DURING THIS VISIT:  New Prescriptions   No medications on file     Note:  This document was prepared using Dragon voice recognition software and may include unintentional dictation errors.    Arta Silence, MD 07/17/19 2017

## 2019-07-17 NOTE — Telephone Encounter (Signed)
Pt. Daughter called said that pt was in  ER yesterday and was told that she need her labs (kidney rechecked)

## 2019-07-17 NOTE — ED Notes (Signed)
Patient verbalizes understanding of discharge instructions, follow up care, and prescription medications. Opportunity for questioning and answers were provided. All questions answered completely. PIV removed, catheter intact. Site dressed with gauze and tape. Armband removed by staff, pt discharged from ED. Wheeled from ED and picked up by daughter

## 2019-07-17 NOTE — Discharge Instructions (Addendum)
You were seen today for diarrhea.  Stool studies are pending.  In the meantime, take Flagyl to cover for GI pathogens.  You were noted to be dehydrated and had an elevated creatinine.  This is likely secondary to dehydration.  Follow-up closely with your primary doctor within a week for recheck of your creatinine.  If you develop recurrent dizziness, fevers, abdominal pain, any new or worsening symptoms you should be reevaluated.  Make sure that you are drinking plenty of fluids.

## 2019-07-17 NOTE — ED Notes (Signed)
Pt provided with Po contrast [facility] completed.  Fluids initiated per [facility]. Name/DOB verified

## 2019-07-17 NOTE — ED Triage Notes (Signed)
Pt comes POV with recent n/v/d for 4 days. Pt states it's watery and large amounts. Pt also states that her blood sugar won't go back up despite holding her insulin. Pt also states some blurred vision. Seen and d/c last night from [place].

## 2019-07-17 NOTE — ED Notes (Signed)
First Nurse Note;   This RN received call from pt's PCP office to notify ED of pt expected arrival.  Pt being sent over for further evaluation and possible admission due to Dehydration and AKI.    Pt alert, NAD noted upon arrival.

## 2019-07-17 NOTE — Telephone Encounter (Signed)
Patient's daughter called today. Patient was not improved. She was concerned. Patient cannot keep anything down still, diarrhea, dizzy, concern dehydrated among other symptoms.  They went to [facility] last time.  I called [facility] ED Triage and notified them about this patient case, and recent ED discharge from 3/16 yesterday.  They will anticipate patient arriving at [facility] ED for re-evaluation and may warrant hospital admission for at least observation or inpatient status pending clinical evaluation. I am very concerned that she previously finished hospital stay with colon cancer post-op and then SNF then has not been stable since discharged home, she has required home health assistance and they have been concerned about her as well.  [name], [facility] 07/17/2019, 2:44 PM

## 2019-07-17 NOTE — ED Notes (Signed)
Pt taken to CT.

## 2019-07-18 ENCOUNTER — Encounter: Payer: Self-pay | Admitting: Internal Medicine

## 2019-07-18 DIAGNOSIS — E875 Hyperkalemia: Secondary | ICD-10-CM | POA: Diagnosis present

## 2019-07-18 DIAGNOSIS — N1831 Chronic kidney disease, stage 3a: Secondary | ICD-10-CM | POA: Diagnosis present

## 2019-07-18 DIAGNOSIS — U071 COVID-19: Secondary | ICD-10-CM | POA: Diagnosis present

## 2019-07-18 DIAGNOSIS — D630 Anemia in neoplastic disease: Secondary | ICD-10-CM | POA: Diagnosis present

## 2019-07-18 DIAGNOSIS — N179 Acute kidney failure, unspecified: Secondary | ICD-10-CM | POA: Diagnosis present

## 2019-07-18 DIAGNOSIS — E1143 Type 2 diabetes mellitus with diabetic autonomic (poly)neuropathy: Secondary | ICD-10-CM | POA: Diagnosis present

## 2019-07-18 DIAGNOSIS — E1165 Type 2 diabetes mellitus with hyperglycemia: Secondary | ICD-10-CM | POA: Diagnosis present

## 2019-07-18 DIAGNOSIS — Z8616 Personal history of covid-19: Secondary | ICD-10-CM | POA: Diagnosis not present

## 2019-07-18 DIAGNOSIS — F259 Schizoaffective disorder, unspecified: Secondary | ICD-10-CM | POA: Diagnosis present

## 2019-07-18 DIAGNOSIS — Z85038 Personal history of other malignant neoplasm of large intestine: Secondary | ICD-10-CM | POA: Diagnosis not present

## 2019-07-18 DIAGNOSIS — K529 Noninfective gastroenteritis and colitis, unspecified: Principal | ICD-10-CM

## 2019-07-18 DIAGNOSIS — E1122 Type 2 diabetes mellitus with diabetic chronic kidney disease: Secondary | ICD-10-CM | POA: Diagnosis present

## 2019-07-18 DIAGNOSIS — Z9049 Acquired absence of other specified parts of digestive tract: Secondary | ICD-10-CM | POA: Diagnosis not present

## 2019-07-18 DIAGNOSIS — K3184 Gastroparesis: Secondary | ICD-10-CM | POA: Diagnosis present

## 2019-07-18 DIAGNOSIS — D649 Anemia, unspecified: Secondary | ICD-10-CM | POA: Diagnosis not present

## 2019-07-18 DIAGNOSIS — R000 Tachycardia, unspecified: Secondary | ICD-10-CM | POA: Diagnosis present

## 2019-07-18 DIAGNOSIS — E872 Acidosis: Secondary | ICD-10-CM | POA: Diagnosis present

## 2019-07-18 DIAGNOSIS — D62 Acute posthemorrhagic anemia: Secondary | ICD-10-CM | POA: Diagnosis present

## 2019-07-18 DIAGNOSIS — E785 Hyperlipidemia, unspecified: Secondary | ICD-10-CM | POA: Diagnosis present

## 2019-07-18 DIAGNOSIS — E8881 Metabolic syndrome: Secondary | ICD-10-CM | POA: Diagnosis present

## 2019-07-18 DIAGNOSIS — F329 Major depressive disorder, single episode, unspecified: Secondary | ICD-10-CM | POA: Diagnosis present

## 2019-07-18 DIAGNOSIS — E1142 Type 2 diabetes mellitus with diabetic polyneuropathy: Secondary | ICD-10-CM | POA: Diagnosis present

## 2019-07-18 DIAGNOSIS — I959 Hypotension, unspecified: Secondary | ICD-10-CM | POA: Diagnosis present

## 2019-07-18 DIAGNOSIS — R197 Diarrhea, unspecified: Secondary | ICD-10-CM

## 2019-07-18 DIAGNOSIS — C187 Malignant neoplasm of sigmoid colon: Secondary | ICD-10-CM | POA: Diagnosis not present

## 2019-07-18 DIAGNOSIS — Z8619 Personal history of other infectious and parasitic diseases: Secondary | ICD-10-CM | POA: Diagnosis not present

## 2019-07-18 DIAGNOSIS — I129 Hypertensive chronic kidney disease with stage 1 through stage 4 chronic kidney disease, or unspecified chronic kidney disease: Secondary | ICD-10-CM | POA: Diagnosis present

## 2019-07-18 LAB — CBC
HCT: 22.9 % — ABNORMAL LOW (ref 36.0–46.0)
Hemoglobin: 6.9 g/dL — ABNORMAL LOW (ref 12.0–15.0)
MCH: 24.3 pg — ABNORMAL LOW (ref 26.0–34.0)
MCHC: 30.1 g/dL (ref 30.0–36.0)
MCV: 80.6 fL (ref 80.0–100.0)
Platelets: 240 10*3/uL (ref 150–400)
RBC: 2.84 MIL/uL — ABNORMAL LOW (ref 3.87–5.11)
RDW: 17.4 % — ABNORMAL HIGH (ref 11.5–15.5)
WBC: 7.5 10*3/uL (ref 4.0–10.5)
nRBC: 0 % (ref 0.0–0.2)

## 2019-07-18 LAB — COMPREHENSIVE METABOLIC PANEL
ALT: 22 U/L (ref 0–44)
AST: 20 U/L (ref 15–41)
Albumin: 3.2 g/dL — ABNORMAL LOW (ref 3.5–5.0)
Alkaline Phosphatase: 98 U/L (ref 38–126)
Anion gap: 7 (ref 5–15)
BUN: 38 mg/dL — ABNORMAL HIGH (ref 6–20)
CO2: 15 mmol/L — ABNORMAL LOW (ref 22–32)
Calcium: 9.3 mg/dL (ref 8.9–10.3)
Chloride: 115 mmol/L — ABNORMAL HIGH (ref 98–111)
Creatinine, Ser: 1.36 mg/dL — ABNORMAL HIGH (ref 0.44–1.00)
GFR calc Af Amer: 51 mL/min — ABNORMAL LOW (ref >60)
GFR calc non Af Amer: 44 mL/min — ABNORMAL LOW (ref >60)
Glucose, Bld: 110 mg/dL — ABNORMAL HIGH (ref 70–99)
Potassium: 5.8 mmol/L — ABNORMAL HIGH (ref 3.5–5.1)
Sodium: 137 mmol/L (ref 135–145)
Total Bilirubin: 0.4 mg/dL (ref 0.3–1.2)
Total Protein: 6.4 g/dL — ABNORMAL LOW (ref 6.5–8.1)

## 2019-07-18 LAB — IRON AND TIBC
Iron: 24 ug/dL — ABNORMAL LOW (ref 28–170)
Saturation Ratios: 8 % — ABNORMAL LOW (ref 10.4–31.8)
TIBC: 288 ug/dL (ref 250–450)
UIBC: 264 ug/dL

## 2019-07-18 LAB — FOLATE: Folate: 12.4 ng/mL (ref >5.9)

## 2019-07-18 LAB — FERRITIN: Ferritin: 40 ng/mL (ref 11–307)

## 2019-07-18 LAB — GLUCOSE, CAPILLARY
Glucose-Capillary: 122 mg/dL — ABNORMAL HIGH (ref 70–99)
Glucose-Capillary: 125 mg/dL — ABNORMAL HIGH (ref 70–99)
Glucose-Capillary: 133 mg/dL — ABNORMAL HIGH (ref 70–99)
Glucose-Capillary: 136 mg/dL — ABNORMAL HIGH (ref 70–99)
Glucose-Capillary: 167 mg/dL — ABNORMAL HIGH (ref 70–99)

## 2019-07-18 LAB — PROCALCITONIN: Procalcitonin: <0.1 ng/mL

## 2019-07-18 LAB — PREPARE RBC (CROSSMATCH)

## 2019-07-18 LAB — SARS CORONAVIRUS 2 (TAT 6-24 HRS): SARS Coronavirus 2: POSITIVE — AB

## 2019-07-18 LAB — VITAMIN B12: Vitamin B-12: 589 pg/mL (ref 180–914)

## 2019-07-18 MED ORDER — COLESTIPOL HCL 1 G PO TABS
2.0000 g | ORAL_TABLET | Freq: Two times a day (BID) | ORAL | Status: DC
  Administered 2019-07-18 – 2019-07-19 (×2): 2 g via ORAL
  Filled 2019-07-18 (×3): qty 2

## 2019-07-18 MED ORDER — SODIUM CHLORIDE 0.9% IV SOLUTION: Freq: Once | INTRAVENOUS | Status: AC

## 2019-07-18 NOTE — Progress Notes (Signed)
[place] [facility] [place] NAME: [name]    MR#:  [id number]  DATE OF BIRTH:  10-30-1965  SUBJECTIVE:  CHIEF COMPLAINT:   Chief Complaint  Patient presents with  . Hypoglycemia  . Nausea  . Emesis  Hb 6.9, feels weak, tachycardic, hypotensive. diarrhea REVIEW OF SYSTEMS:  Review of Systems  Constitutional: Positive for malaise/fatigue. Negative for diaphoresis, fever and weight loss.  HENT: Negative for ear discharge, ear pain, hearing loss, nosebleeds, sore throat and tinnitus.   Eyes: Negative for blurred vision and pain.  Respiratory: Negative for cough, hemoptysis, shortness of breath and wheezing.   Cardiovascular: Negative for chest pain, palpitations, orthopnea and leg swelling.  Gastrointestinal: Positive for diarrhea. Negative for abdominal pain, blood in stool, constipation, heartburn, nausea and vomiting.  Genitourinary: Negative for dysuria, frequency and urgency.  Musculoskeletal: Negative for back pain and myalgias.  Skin: Negative for itching and rash.  Neurological: Negative for dizziness, tingling, tremors, focal weakness, seizures, weakness and headaches.  Psychiatric/Behavioral: Negative for depression. The patient is not nervous/anxious.    DRUG ALLERGIES:  No Known Allergies VITALS:  Blood pressure (!) 110/57, pulse (!) 104, temperature 98 F (36.7 C), temperature source Oral, resp. rate 18, height 5\' 8"  (1.727 m), weight 118 kg, SpO2 99 %. PHYSICAL EXAMINATION:  Physical Exam HENT:     Head: Normocephalic and atraumatic.  Eyes:     Conjunctiva/sclera: Conjunctivae normal.     Pupils: Pupils are equal, round, and reactive to light.  Neck:     Thyroid: No thyromegaly.     Trachea: No tracheal deviation.  Cardiovascular:     Rate and Rhythm: Normal rate and regular rhythm.     Heart sounds: Normal heart sounds.  Pulmonary:     Effort: Pulmonary effort is normal. No respiratory distress.     Breath sounds: Normal breath  sounds. No wheezing.  Chest:     Chest wall: No tenderness.  Abdominal:     General: Bowel sounds are normal. There is no distension.     Palpations: Abdomen is soft.     Tenderness: There is no abdominal tenderness.  Musculoskeletal:        General: Normal range of motion.     Cervical back: Normal range of motion and neck supple.  Skin:    General: Skin is warm and dry.     Findings: No rash.  Neurological:     Mental Status: She is alert and oriented to person, place, and time.     Cranial Nerves: No cranial nerve deficit.    LABORATORY PANEL:  Female CBC Recent Labs  Lab 07/18/19 0354  WBC 7.5  HGB 6.9*  HCT 22.9*  PLT 240   ------------------------------------------------------------------------------------------------------------------ Chemistries  Recent Labs  Lab 07/18/19 0354  NA 137  K 5.8*  CL 115*  CO2 15*  GLUCOSE 110*  BUN 38*  CREATININE 1.36*  CALCIUM 9.3  AST 20  ALT 22  ALKPHOS 98  BILITOT 0.4   RADIOLOGY:  No results found. ASSESSMENT AND PLAN:  [name] is a 54 y.o. female with metabolic syndrome, poorly controlled type 2 diabetes, stage I sigmoid colon cancer s/p partial colectomy in 05/2019 with primary anastomosis, s/p cholecystectomy more than 5 years ago now admitted with nausea, vomiting and nonbloody diarrhea, also found to have AKI on CKD and worsening anemia.      Severe sepsis ([place])   Acute on chronic diarrhea   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[place])  S/P partial colectomy Patient presented with a 3-day history of protracted diarrhea up to 20 times a day, with tachycardia, hypotension, acute kidney injury -CT abdomen and pelvis done in the emergency room the day prior was mostly consistent with acute diarrheal illness but with recommendation for further evaluation with CT angiogram with concern for ischemic bowel -C Diff neg -IV ceftriaxone and metronidazole -IV fluids per sepsis protocol -neg blood cultures -  appreciate GI input -> Trial of cholestyramine or colestipol 2 g twice daily - possible underlying diabetic gastroparesis in setting of poorly controlled diabetes  * Acute on chronic blood loss anemia with underlying malignancy, IDA Hb 6.9 this am, will order 1 PRBC,  Patient consented. - GI recommends parenteral iron therapy  AKI (acute kidney injury) ([place])   Hyperkalemia - K 5.8 likely due to renal failure s/p 1 dose of valtessa -Creatinine 1.74-> 1.3, up from 0.98 baseline -Continue IV hydration and monitor renal function -Hold home lisinopril, hydrochlorothiazide and Lasix due to renal function    Essential hypertension -Hold all home antihypertensives due to hypotension. -BP as low as 70/40 in the ER    Schizoaffective disorder (HCC) -Continue home meds    Diabetes mellitus with polyneuropathy (HCC) -Low-dose daily Lantus to 15 units as opposed to home dose of 60 units -Insulin sliding scale coverage    DVT prophylaxis: Lovenox  Code Status: full code  Family Communication:  d/w daughter over phone while in room on patient's phone. Disposition Plan: Back to previous home environment Consults called: GI Barriers - improvement in her diarrhea and hemodynamics      All the records are reviewed and case discussed with Care Management/Social Worker. Management plans discussed with the patient, family and they are in agreement.  CODE STATUS: Full Code  TOTAL TIME TAKING CARE OF THIS PATIENT: 35 minutes.   More than 50% of the time was spent in counseling/coordination of care: YES  POSSIBLE D/C IN 1-2 DAYS, DEPENDING ON CLINICAL CONDITION.   Max Sane M.D on 07/18/2019 at 8:44 PM  [facility]   CC: Primary care physician; [name], DO  Note: This dictation was prepared with Dragon dictation along with smaller phrase technology. Any transcriptional errors that result from this process are unintentional.

## 2019-07-18 NOTE — Progress Notes (Signed)
P H+H 6.9/22.9 Pt with no active bleed noted. No BM this AM, urine clear yellow, no pain reported. Rufina Falco NP aware, order for repeat H+H 0800

## 2019-07-18 NOTE — Consult Note (Signed)
Cephas Darby, MD [address]  [place], [place] [address]  Main: [phone]  Fax: [phone] Pager: [phone]   Consultation  Referring Provider:     No ref. provider found Primary Care Physician:  [name], DO Primary Gastroenterologist:  Dr. Alice Reichert       Reason for Consultation:     Chronic diarrhea and worsening anemia  Date of Admission:  07/17/2019 Date of Consultation:  07/18/2019         HPI:   [name] is a 54 y.o. 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  Patient reports that she has been experiencing nonbloody diarrhea for several months, which has gotten worse in last 2 months.  She originally underwent colonoscopy in 05/2019 secondary to occult blood in stool and anemia, found to have colon cancer.  She also underwent upper endoscopy, found to have gastric polyps that were fundic gland polyps.  Patient states she has recovered from Covid in the past, however she is tested positive for Covid during this admission.  She also has poorly controlled diabetes, hemoglobin A1c 10-11, worsening of bilateral swelling of legs.  Patient is found to have hemoglobin of 8.2 on admission, declined to 6.9 today.  Patient also has worsening renal function on admission, which is improving.  GI is consulted to evaluate for worsening of hemoglobin and diarrhea.  With regards to her diarrhea, patient reports that she always had loose stools, but have become more frequent and runny in last 2 months.  She denies seeing any blood in the stools, melena or rectal bleeding.  Stool studies were negative for C. difficile.  Other GI pathogen panel is in process.  Patient has history of Cryptosporidium infection on 05/24/2019.  Patient is empirically started on ceftriaxone and Flagyl She underwent CT abdomen and pelvis without contrast which revealed  expected postsurgical changes with no evidence of obstruction at the anastomotic site  NSAIDs: None  Antiplts/Anticoagulants/Anti thrombotics: None  GI Procedures:  EGD and colonoscopy 05/26/2019 - Z-line irregular, 36 to 37 cm from the incisors. Biopsied. - A few gastric polyps. Biopsied. - Normal examined duodenum. - The examination was otherwise normal.  - Likely malignant partially obstructing tumor in the mid sigmoid colon. Biopsied. Tattooed. - The examination was otherwise normal on direct and retroflexion views.  DIAGNOSIS:  A. STOMACH POLYP, FUNDUS; COLD BIOPSY:  - FUNDIC GLAND POLYP.  - NEGATIVE FOR H. PYLORI, DYSPLASIA, AND MALIGNANCY.   B. GASTROESOPHAGEAL JUNCTION; COLD BIOPSY:  - SQUAMOCOLUMNAR MUCOSA WITH FEATURES OF MILD REFLUX GASTROESOPHAGITIS.  - NO INCREASE IN INTRAEPITHELIAL EOSINOPHILS (LESS THAN 2 PER HPF).  - NEGATIVE FOR INTESTINAL METAPLASIA, DYSPLASIA, AND MALIGNANCY.   C. COLON MASS, SIGMOID AT 25-30 CM; COLD BIOPSY:  - INVASIVE MODERATELY DIFFERENTIATED ADENOCARCINOMA.   Sigmoid colectomy 05/28/2019  DIAGNOSIS:  A. COLON, SIGMOID; PARTIAL COLECTOMY:  - INVASIVE MODERATELY DIFFERENTIATED ADENOCARCINOMA.  - TWENTY FOUR LYMPH NODES, NEGATIVE FOR MALIGNANCY (0/24).  - TATTOO PIGMENT PRESENT WITHIN LYMPH NODE SECTIONS.     Past Medical History:  Diagnosis Date  . Depression   . Diabetes mellitus without complication ([place])   . Hyperlipidemia   . Hypertension   . Neuromuscular disorder ([place])   . Schizo affective schizophrenia ([place])    abilify and pazil    Past Surgical History:  Procedure Laterality Date  . CHOLECYSTECTOMY    . COLONOSCOPY WITH  PROPOFOL N/A 05/26/2019   Procedure: COLONOSCOPY WITH PROPOFOL;  Surgeon: Toledo, Benay Pike, MD;  Location: [facility];  Service: Gastroenterology;  Laterality: N/A;  . ESOPHAGOGASTRODUODENOSCOPY (EGD) WITH PROPOFOL N/A 05/26/2019   Procedure: ESOPHAGOGASTRODUODENOSCOPY (EGD) WITH PROPOFOL;   Surgeon: Toledo, Benay Pike, MD;  Location: [facility];  Service: Gastroenterology;  Laterality: N/A;  . IRRIGATION AND DEBRIDEMENT FOOT Right 02/26/2016   Procedure: RIGHT 2ND TOE DEBRIDEMENT;  Surgeon: Samara Deist, DPM;  Location: [facility];  Service: Podiatry;  Laterality: Right;  . IRRIGATION AND DEBRIDEMENT FOOT Left 12/13/2018   Procedure: IRRIGATION AND DEBRIDEMENT FOOT;  Surgeon: Caroline More, DPM;  Location: [facility];  Service: Podiatry;  Laterality: Left;    Prior to Admission medications   Medication Sig Start Date End Date Taking? Authorizing Provider  acetaminophen (TYLENOL) 325 MG tablet Take 2 tablets (650 mg total) by mouth every 6 (six) hours as needed for mild pain. 06/04/19  Yes Nicole Kindred A, DO  ARIPiprazole (ABILIFY) 10 MG tablet Take 10 mg by mouth at bedtime.    Yes [provider]  ascorbic acid (VITAMIN C) 500 MG tablet Take 500 mg by mouth daily.   Yes [provider]  aspirin EC 81 MG tablet Take 81 mg by mouth daily.    Yes [provider]  atorvastatin (LIPITOR) 40 MG tablet Take 1 tablet (40 mg total) by mouth daily. 02/20/19  Yes Karamalegos, Devonne Doughty, DO  buPROPion (WELLBUTRIN XL) 150 MG 24 hr tablet Take 150 mg by mouth every morning.    Yes [provider]  Ferrous Sulfate 27 MG TABS Take 1 tablet by mouth 2 (two) times daily with a meal.    Yes [provider]  furosemide (LASIX) 40 MG tablet Take 1 tablet (40 mg total) by mouth daily. 06/04/19 06/03/20 Yes Nicole Kindred A, DO  gabapentin (NEURONTIN) 300 MG capsule Take 1 capsule (300 mg total) by mouth at bedtime. 06/04/19  Yes Nicole Kindred A, DO  hydrochlorothiazide (HYDRODIURIL) 12.5 MG tablet Take 2 tablets (25 mg total) by mouth daily. 07/15/19  Yes Karamalegos, Devonne Doughty, DO  insulin aspart (NOVOLOG) 100 UNIT/ML injection Inject 0-15 Units into the skin 3 (three) times daily with meals. 06/04/19  Yes Nicole Kindred A, DO  Insulin Glargine (LANTUS  SOLOSTAR) 100 UNIT/ML Solostar Pen Inject 60 Units into the skin daily before breakfast. 06/04/19  Yes Ezekiel Slocumb, DO  Lancets (ONETOUCH ULTRASOFT) lancets Use to check blood sugar up to 3 x daily 03/18/19  Yes Karamalegos, Devonne Doughty, DO  lisinopril (ZESTRIL) 20 MG tablet Take 1 tablet (20 mg total) by mouth daily. 07/09/19  Yes Karamalegos, Devonne Doughty, DO  metFORMIN (GLUCOPHAGE) 1000 MG tablet Take 1 tablet (1,000 mg total) by mouth 2 (two) times daily with a meal. 02/20/19  Yes Karamalegos, [name] J, DO  ondansetron (ZOFRAN) 4 MG tablet Take 1 tablet (4 mg total) by mouth every 6 (six) hours. Patient taking differently: Take 4 mg by mouth every 8 (eight) hours as needed for nausea or vomiting.  05/16/19  Yes Wurst, [place], PA-C  ONETOUCH VERIO test strip Check blood sugar up to 5 x daily as needed 04/17/19  Yes Karamalegos, [name] J, DO  pantoprazole (PROTONIX) 40 MG tablet Take 1 tablet (40 mg total) by mouth at bedtime. 06/04/19  Yes Nicole Kindred A, DO  vitamin B-12 1000 MCG tablet Take 1 tablet (1,000 mcg total) by mouth daily. 06/05/19  Yes Nicole Kindred A, DO  metroNIDAZOLE (FLAGYL) 500 MG  tablet Take 1 tablet (500 mg total) by mouth 2 (two) times daily. 07/17/19   Horton, Barbette Hair, MD  traZODone (DESYREL) 50 MG tablet Take 1 tablet (50 mg total) by mouth at bedtime as needed for sleep. 07/09/19   [name], DO   Current Facility-Administered Medications:  .  0.9 %  sodium chloride infusion, , Intravenous, Continuous, Athena Masse, MD, Last Rate: 100 mL/hr at 07/18/19 0748, Restarted at 07/18/19 0748 .  acetaminophen (TYLENOL) tablet 650 mg, 650 mg, Oral, Q6H PRN **OR** acetaminophen (TYLENOL) suppository 650 mg, 650 mg, Rectal, Q6H PRN, Athena Masse, MD .  ARIPiprazole (ABILIFY) tablet 10 mg, 10 mg, Oral, QHS, Athena Masse, MD, 10 mg at 07/17/19 2219 .  aspirin EC tablet 81 mg, 81 mg, Oral, Daily, Judd Gaudier V, MD, 81 mg at 07/18/19 1153 .   atorvastatin (LIPITOR) tablet 40 mg, 40 mg, Oral, Daily, Judd Gaudier V, MD, 40 mg at 07/18/19 1153 .  buPROPion (WELLBUTRIN XL) 24 hr tablet 150 mg, 150 mg, Oral, q morning - 10a, Athena Masse, MD, 150 mg at 07/18/19 1153 .  cefTRIAXone (ROCEPHIN) 2 g in sodium chloride 0.9 % 100 mL IVPB, 2 g, Intravenous, Q24H, Athena Masse, MD, Stopped at 07/17/19 2319 .  colestipol (COLESTID) tablet 2 g, 2 g, Oral, BID, Janijah Symons, Tally Due, MD .  enoxaparin (LOVENOX) injection 40 mg, 40 mg, Subcutaneous, Q24H, Athena Masse, MD, 40 mg at 07/17/19 2220 .  HYDROcodone-acetaminophen (NORCO/VICODIN) 5-325 MG per tablet 1-2 tablet, 1-2 tablet, Oral, Q4H PRN, Judd Gaudier V, MD .  insulin aspart (novoLOG) injection 0-15 Units, 0-15 Units, Subcutaneous, TID WC, Athena Masse, MD, 2 Units at 07/18/19 [phone] .  insulin glargine (LANTUS) injection 15 Units, 15 Units, Subcutaneous, Daily, Judd Gaudier V, MD .  metroNIDAZOLE (FLAGYL) IVPB 500 mg, 500 mg, Intravenous, Q8H, Athena Masse, MD, Stopped at 07/18/19 0417 .  ondansetron (ZOFRAN) tablet 4 mg, 4 mg, Oral, Q6H PRN **OR** ondansetron (ZOFRAN) injection 4 mg, 4 mg, Intravenous, Q6H PRN, Athena Masse, MD .  pantoprazole (PROTONIX) EC tablet 40 mg, 40 mg, Oral, QHS, Athena Masse, MD, 40 mg at 07/17/19 2220 .  vitamin B-12 (CYANOCOBALAMIN) tablet 1,000 mcg, 1,000 mcg, Oral, Daily, Athena Masse, MD, 1,000 mcg at 07/18/19 1153   Family History  Problem Relation Age of Onset  . Breast cancer Cousin        maternal cousin  . Hypertension Mother   . Hyperthyroidism Mother   . Dementia Mother   . Prostate cancer Father   . Diabetes Maternal Grandmother   . Hypertension Maternal Grandmother   . Cancer Maternal Grandmother        unknown type  . Diabetes Maternal Grandfather   . Hypertension Maternal Grandfather   . Diabetes Paternal Grandmother   . Hypertension Paternal Grandmother   . Diabetes Paternal Grandfather   . Hypertension Paternal  Grandfather      Social History   Tobacco Use  . Smoking status: Former Smoker    Packs/day: 3.00    Years: 30.00    Pack years: 90.00    Types: Cigarettes    Quit date: 05/02/2009    Years since quitting: 10.2  . Smokeless tobacco: Never Used  . Tobacco comment: Pt reported  quitting in 2011  Substance Use Topics  . Alcohol use: No  . Drug use: No    Allergies as of 07/17/2019  . (No Known Allergies)  Review of Systems:    All systems reviewed and negative except where noted in HPI.   Physical Exam:  Vital signs in last 24 hours: Temp:  [97.6 F (36.4 C)-98.6 F (37 C)] 98.4 F (36.9 C) (03/18 1240) Pulse Rate:  [92-107] 106 (03/18 1240) Resp:  [9-21] 18 (03/18 1240) BP: (109-144)/(52-81) 111/61 (03/18 1240) SpO2:  [97 %-100 %] 100 % (03/18 1240) Last BM Date: 07/17/19 General:   Pleasant, cooperative in NAD Head:  Normocephalic and atraumatic. Eyes:   No icterus.   Conjunctiva pale. PERRLA. Ears:  Normal auditory acuity. Neck:  Supple; no masses or thyroidomegaly Lungs: Respirations even and unlabored. Lungs clear to auscultation bilaterally.   No wheezes, crackles, or rhonchi.  Heart:  Regular rate and rhythm;  Without murmur, clicks, rubs or gallops Abdomen:  Soft, nondistended, nontender. Normal bowel sounds. No appreciable masses or hepatomegaly.  No rebound or guarding.  Small scars at multiple sites in right side of the abdomen from previous laparoscopic surgery Rectal:  Not performed. Msk:  Symmetrical without gross deformities.  Strength generalized weakness Extremities:  3+ edema, no cyanosis or clubbing. Neurologic:  Alert and oriented x3;  grossly normal neurologically. Skin:  Intact without significant lesions or rashes. Psych:  Alert and cooperative. Normal affect.  LAB RESULTS: CBC Latest Ref Rng & Units 07/18/2019 07/17/2019 07/17/2019  WBC 4.0 - 10.5 K/uL 7.5 9.7 10.7(H)  Hemoglobin 12.0 - 15.0 g/dL 6.9(L) 7.4(L) 7.9(L)  Hematocrit 36.0 - 46.0 %  22.9(L) 24.9(L) 26.6(L)  Platelets 150 - 400 K/uL 240 275 291    BMET BMP Latest Ref Rng & Units 07/18/2019 07/17/2019 07/17/2019  Glucose 70 - 99 mg/dL 110(H) - 122(H)  BUN 6 - 20 mg/dL 38(H) - 43(H)  Creatinine 0.44 - 1.00 mg/dL 1.36(H) 1.72(H) 1.74(H)  BUN/Creat Ratio 6 - 22 (calc) - - -  Sodium 135 - 145 mmol/L 137 - 134(L)  Potassium 3.5 - 5.1 mmol/L 5.8(H) - 6.2(H)  Chloride 98 - 111 mmol/L 115(H) - 111  CO2 22 - 32 mmol/L 15(L) - 13(L)  Calcium 8.9 - 10.3 mg/dL 9.3 - 9.9    LFT Hepatic Function Latest Ref Rng & Units 07/18/2019 07/17/2019 07/16/2019  Total Protein 6.5 - 8.1 g/dL 6.4(L) 7.4 7.4  Albumin 3.5 - 5.0 g/dL 3.2(L) 3.9 3.5  AST 15 - 41 U/L '20 28 19  ' ALT 0 - 44 U/L '22 27 24  ' Alk Phosphatase 38 - 126 U/L 98 113 122  Total Bilirubin 0.3 - 1.2 mg/dL 0.4 0.4 0.5     STUDIES: CT ABDOMEN PELVIS WO CONTRAST  Result Date: 07/17/2019 CLINICAL DATA:  Diarrhea, weakness and dizziness, history of diabetes and cholecystectomy EXAM: CT ABDOMEN AND PELVIS WITHOUT CONTRAST TECHNIQUE: Multidetector CT imaging of the abdomen and pelvis was performed following the standard protocol without IV contrast. COMPARISON:  CT abdomen pelvis 05/26/2019 FINDINGS: Lower chest: Lung bases are clear. Normal heart size. No pericardial effusion. Small soft tissue nodule contiguous with the dermis, likely dermal inclusion cyst measuring 1 cm in size (3/12) seen along the left posterior chest wall, unchanged from prior. Hepatobiliary: No focal liver abnormality is seen. Patient is post cholecystectomy. Slight prominence of the biliary tree likely related to reservoir effect. No calcified intraductal gallstones. Pancreas: Unremarkable. No pancreatic ductal dilatation or surrounding inflammatory changes. Spleen: Normal in size without focal abnormality. Adrenals/Urinary Tract: 1.8 cm nodule in the body of the left adrenal gland measures 14 HU, unchanged from prior. No right adrenal nodule. Stable mild  bilateral  symmetric perinephric stranding, a nonspecific finding though may correlate with either age or decreased renal function. No visible or contour deforming renal lesions. No urolithiasis or hydronephrosis. Urinary bladder is largely decompressed at the time of exam and therefore poorly evaluated by CT imaging. Stomach/Bowel: Distal esophagus, stomach and duodenal sweep are unremarkable. No small bowel wall thickening or dilatation. No evidence of obstruction. The appendix is not well visualized. No pericecal inflammation to suggest an occult appendicitis. Much of the colon is fluid-filled with a lack of formed stool. Patent colocolonic anastomosis in the level of the sigmoid. Vascular/Lymphatic: Atherosclerotic plaque within the normal caliber aorta. Some focal stranding appears centered upon the proximal segment of the IMA (3/57). Vascular evaluation is limited in the absence of contrast media though feasibly may be postoperative given history of recent colonic resection. No other acute vascular abnormality is seen. Few reactive lymph nodes in the adjacent retroperitoneum. No pathologically enlarged abdominopelvic nodes. Reproductive: Anteverted uterus. Stable appearance of a 4 x 3.6 x 3.1 cm dermoid in the right adnexa. No new or concerning adnexal lesions. Other: No free air or free fluid. No bowel containing hernia. Linear soft tissue stranding in the right lower quadrant extending to the abdominal wall has an appearance most compatible with port placement. Correlate for history of laparoscopic intervention. Musculoskeletal: No acute osseous abnormality or suspicious osseous lesion. IMPRESSION: 1. Expected postsurgical changes following sigmoid resection. No evidence of obstruction at the anastomotic site in the pelvis. 2. Much of the colon is fluid-filled with a lack of formed stool. This finding can be seen with a diarrheal illness. 3. Some focal stranding appears centered upon the proximal segment of the IMA.  Vascular evaluation is limited in the absence of contrast media. This may be a postoperative change given recent sigmoid resection however if there is clinical concern for a vascular issue, consider CT angiography of the abdomen and pelvis. 4. Stable appearance of a 4 cm right adnexal dermoid. 5. Stable 1.8 cm left adrenal nodule. 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 6. Stable mild bilateral perinephric stranding, a nonspecific finding though may correlate with either age or decreased renal function. 7. Aortic Atherosclerosis (ICD10-I70.0). Electronically Signed   By: Lovena Le M.D.   On: 07/17/2019 05:41      Impression / Plan:   [name] is a 54 y.o. female with metabolic syndrome, poorly controlled type 2 diabetes, stage I sigmoid colon cancer s/p partial colectomy in 05/2019 with primary anastomosis, s/p cholecystectomy more than 5 years ago who is admitted with nausea, vomiting and nonbloody diarrhea, also found to have AKI on CKD and worsening anemia.  Patient has metabolic acidosis  Nausea, vomiting and nonbloody diarrhea Stool studies are pending to rule out infection C. difficile negative Low threshold to discontinue antibiotics 10% of the COVID-19 positive patients present with GI symptoms Trial of cholestyramine or colestipol 2 g twice daily as patient reports chronic history of diarrhea She may have underlying diabetic gastroparesis in setting of poorly controlled diabetes Continue antiemetics as needed Tight control of diabetes  Iron deficiency anemia secondary to chronic blood loss from underlying malignancy Worsening of anemia with no active GI bleed, likely secondary to worsening of kidney function and underlying infection Serum ferritin less than 50, recommend parenteral iron therapy Normal B12 and folate levels Do not recommend endoscopic intervention at this time  Stage I sigmoid cancer, s/p sigmoid colectomy Recommend surveillance  colonoscopy in 10/2019  Thank you for involving  me in the care of this patient.  GI will follow along with you    LOS: 0 days   Sherri Sear, MD  07/18/2019, 4:04 PM   Note: This dictation was prepared with Dragon dictation along with smaller phrase technology. Any transcriptional errors that result from this process are unintentional.

## 2019-07-19 ENCOUNTER — Telehealth: Payer: Self-pay | Admitting: Family Medicine

## 2019-07-19 DIAGNOSIS — E875 Hyperkalemia: Secondary | ICD-10-CM

## 2019-07-19 LAB — CBC
HCT: 26.7 % — ABNORMAL LOW (ref 36.0–46.0)
Hemoglobin: 8.2 g/dL — ABNORMAL LOW (ref 12.0–15.0)
MCH: 25 pg — ABNORMAL LOW (ref 26.0–34.0)
MCHC: 30.7 g/dL (ref 30.0–36.0)
MCV: 81.4 fL (ref 80.0–100.0)
Platelets: 281 10*3/uL (ref 150–400)
RBC: 3.28 MIL/uL — ABNORMAL LOW (ref 3.87–5.11)
RDW: 17.9 % — ABNORMAL HIGH (ref 11.5–15.5)
WBC: 8.2 10*3/uL (ref 4.0–10.5)
nRBC: 0 % (ref 0.0–0.2)

## 2019-07-19 LAB — TYPE AND SCREEN
ABO/RH(D): O NEG
Antibody Screen: NEGATIVE
Unit division: 0

## 2019-07-19 LAB — BPAM RBC
Blood Product Expiration Date: 202104052359
ISSUE DATE / TIME: 202103181206
Unit Type and Rh: 9500

## 2019-07-19 LAB — POTASSIUM
Potassium: 5.8 mmol/L — ABNORMAL HIGH (ref 3.5–5.1)
Potassium: 5.6 mmol/L — ABNORMAL HIGH (ref 3.5–5.1)

## 2019-07-19 LAB — BASIC METABOLIC PANEL
Anion gap: 9 (ref 5–15)
BUN: 29 mg/dL — ABNORMAL HIGH (ref 6–20)
CO2: 15 mmol/L — ABNORMAL LOW (ref 22–32)
Calcium: 9.7 mg/dL (ref 8.9–10.3)
Chloride: 115 mmol/L — ABNORMAL HIGH (ref 98–111)
Creatinine, Ser: 1.32 mg/dL — ABNORMAL HIGH (ref 0.44–1.00)
GFR calc Af Amer: 53 mL/min — ABNORMAL LOW (ref >60)
GFR calc non Af Amer: 45 mL/min — ABNORMAL LOW (ref >60)
Glucose, Bld: 122 mg/dL — ABNORMAL HIGH (ref 70–99)
Potassium: 5.9 mmol/L — ABNORMAL HIGH (ref 3.5–5.1)
Sodium: 139 mmol/L (ref 135–145)

## 2019-07-19 LAB — PROCALCITONIN: Procalcitonin: <0.1 ng/mL

## 2019-07-19 LAB — GLUCOSE, CAPILLARY
Glucose-Capillary: 117 mg/dL — ABNORMAL HIGH (ref 70–99)
Glucose-Capillary: 139 mg/dL — ABNORMAL HIGH (ref 70–99)

## 2019-07-19 MED ORDER — SODIUM ZIRCONIUM CYCLOSILICATE 10 G PO PACK
10.0000 g | PACK | ORAL | Status: AC
  Administered 2019-07-19: 10 g via ORAL
  Filled 2019-07-19 (×2): qty 1

## 2019-07-19 MED ORDER — METOPROLOL SUCCINATE ER 50 MG PO TB24: 50.0000 mg | ORAL_TABLET | Freq: Every day | ORAL | 0 refills | Status: DC

## 2019-07-19 MED ORDER — METOPROLOL SUCCINATE ER 50 MG PO TB24
50.0000 mg | ORAL_TABLET | Freq: Every day | ORAL | Status: DC
  Administered 2019-07-19: 50 mg via ORAL
  Filled 2019-07-19: qty 1

## 2019-07-19 MED ORDER — COLESTIPOL HCL 1 G PO TABS: 2.0000 g | ORAL_TABLET | Freq: Two times a day (BID) | ORAL | 0 refills | Status: DC

## 2019-07-19 NOTE — Discharge Instructions (Signed)

## 2019-07-19 NOTE — Telephone Encounter (Signed)
The blood work for lab only on 3/24 for 845am was scheduled back on 3/17 after the first ED visit. Orders are still on her chart for BMET and CBC, they are future orders.  However, now she went back to hospital and a new apt was scheduled for HFU on 3/24 at 10am.  Please notify her that  I will cancel the 845am lab only apt on Weds 3/24  She should KEEP her apt on Weds 3/24 at 10am for hospital follow-up.  We can place new blood work orders at that visit and she can get them drawn AFTER visit.  [name], [facility] 07/19/2019, 5:09 PM

## 2019-07-19 NOTE — Telephone Encounter (Signed)
Patient has apt for hospital follow up on 02/23/2020 and needed some blood work her apt scheduled for Lab at 8:45 am and with Dr.K it is 10:00 am

## 2019-07-19 NOTE — Progress Notes (Deleted)
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  INTERVAL HISTORY: Patient is a 54 year old female who was initially evaluated in the hospital for the above-stated colon cancer.  She underwent partial colectomy on May 28, 2019.  She has persistent weakness and fatigue, but admits this is improved since discharge.  She requires a walking boot on her left foot secondary to chronic issues related to her diabetes.  She otherwise feels well.  She has no neurologic complaints.  She denies any recent fevers or illnesses.  She has good appetite and denies weight loss.  She has no chest pain, shortness of breath, cough, or hemoptysis.  She denies any nausea, vomiting, constipation, or diarrhea.  She has no melena or hematochezia.  She has no urinary complaints.  Patient otherwise feels well and offers no further specific complaints today.  REVIEW OF SYSTEMS:   Review of Systems  Constitutional: Positive for malaise/fatigue. Negative for fever and weight loss.  Respiratory: Negative.  Negative for cough, hemoptysis and shortness of breath.   Cardiovascular: Negative.  Negative for chest pain and leg swelling.  Gastrointestinal: Negative.  Negative for abdominal pain, blood in stool and melena.  Genitourinary: Negative.  Negative for dysuria.  Musculoskeletal: Negative.  Negative for back pain.  Skin: Negative.  Negative for rash.  Neurological:  Positive for sensory change. Negative for dizziness, focal weakness, weakness and headaches.  Psychiatric/Behavioral: Negative.  The patient is not nervous/anxious.     As per HPI. Otherwise, a complete review of systems is negative.  PAST MEDICAL HISTORY: Past Medical History:  Diagnosis Date  . Depression   . Diabetes mellitus without complication ([place])   . Hyperlipidemia   . Hypertension   . Neuromuscular disorder ([place])   . Schizo affective schizophrenia ([place])    abilify and pazil    PAST SURGICAL HISTORY: Past Surgical History:  Procedure Laterality Date  . CHOLECYSTECTOMY    . COLONOSCOPY WITH PROPOFOL N/A 05/26/2019   Procedure: COLONOSCOPY WITH PROPOFOL;  Surgeon: Toledo, Benay Pike, MD;  Location: [facility];  Service: Gastroenterology;  Laterality: N/A;  . ESOPHAGOGASTRODUODENOSCOPY (EGD) WITH PROPOFOL N/A 05/26/2019   Procedure: ESOPHAGOGASTRODUODENOSCOPY (EGD) WITH PROPOFOL;  Surgeon: Toledo, Benay Pike, MD;  Location: [facility];  Service: Gastroenterology;  Laterality: N/A;  . IRRIGATION AND DEBRIDEMENT FOOT Right 02/26/2016   Procedure: RIGHT 2ND TOE DEBRIDEMENT;  Surgeon: Samara Deist, DPM;  Location: [facility];  Service: Podiatry;  Laterality: Right;  . IRRIGATION AND DEBRIDEMENT FOOT Left 12/13/2018   Procedure: IRRIGATION AND DEBRIDEMENT FOOT;  Surgeon: Caroline More, DPM;  Location: [facility];  Service: Podiatry;  Laterality: Left;    FAMILY HISTORY: Family History  Problem Relation Age of Onset  . Breast cancer Cousin        maternal cousin  . Hypertension Mother   . Hyperthyroidism Mother   . Dementia Mother   . Prostate cancer Father   . Diabetes Maternal Grandmother   . Hypertension Maternal Grandmother   . Cancer Maternal Grandmother  unknown type  . Diabetes Maternal Grandfather   . Hypertension Maternal Grandfather   . Diabetes Paternal Grandmother   . Hypertension Paternal Grandmother   . Diabetes Paternal Grandfather   .  Hypertension Paternal Grandfather     ADVANCED DIRECTIVES (Y/N):  N  HEALTH MAINTENANCE: Social History   Tobacco Use  . Smoking status: Former Smoker    Packs/day: 3.00    Years: 30.00    Pack years: 90.00    Types: Cigarettes    Quit date: 05/02/2009    Years since quitting: 10.2  . Smokeless tobacco: Never Used  . Tobacco comment: Pt reported  quitting in 2011  Substance Use Topics  . Alcohol use: No  . Drug use: No     Colonoscopy:  PAP:  Bone density:  Lipid panel:  No Known Allergies  Current Outpatient Medications  Medication Sig Dispense Refill  . acetaminophen (TYLENOL) 325 MG tablet Take 2 tablets (650 mg total) by mouth every 6 (six) hours as needed for mild pain.    . ARIPiprazole (ABILIFY) 10 MG tablet Take 10 mg by mouth at bedtime.     [name] ascorbic acid (VITAMIN C) 500 MG tablet Take 500 mg by mouth daily.    [name] aspirin EC 81 MG tablet Take 81 mg by mouth daily.     [name] atorvastatin (LIPITOR) 40 MG tablet Take 1 tablet (40 mg total) by mouth daily. 90 tablet 3  . buPROPion (WELLBUTRIN XL) 150 MG 24 hr tablet Take 150 mg by mouth every morning.     . colestipol (COLESTID) 1 g tablet Take 2 tablets (2 g total) by mouth 2 (two) times daily. 20 tablet 0  . Ferrous Sulfate 27 MG TABS Take 1 tablet by mouth 2 (two) times daily with a meal.     . furosemide (LASIX) 40 MG tablet Take 1 tablet (40 mg total) by mouth daily. 30 tablet 11  . gabapentin (NEURONTIN) 300 MG capsule Take 1 capsule (300 mg total) by mouth at bedtime.    . hydrochlorothiazide (HYDRODIURIL) 12.5 MG tablet Take 2 tablets (25 mg total) by mouth daily. 90 tablet 1  . insulin aspart (NOVOLOG) 100 UNIT/ML injection Inject 0-15 Units into the skin 3 (three) times daily with meals. 10 mL 11  . Insulin Glargine (LANTUS SOLOSTAR) 100 UNIT/ML Solostar Pen Inject 60 Units into the skin daily before breakfast.    . Lancets (ONETOUCH ULTRASOFT) lancets Use to check blood sugar up to 3 x daily 100 each 12  .  metFORMIN (GLUCOPHAGE) 1000 MG tablet Take 1 tablet (1,000 mg total) by mouth 2 (two) times daily with a meal. 180 tablet 3  . [START ON 07/20/2019] metoprolol succinate (TOPROL-XL) 50 MG 24 hr tablet Take 1 tablet (50 mg total) by mouth daily. Take with or immediately following a meal. 30 tablet 0  . ondansetron (ZOFRAN) 4 MG tablet Take 1 tablet (4 mg total) by mouth every 6 (six) hours. (Patient taking differently: Take 4 mg by mouth every 8 (eight) hours as needed for nausea or vomiting. ) 12 tablet 0  . ONETOUCH VERIO test strip Check blood sugar up to 5 x daily as needed 450 each 3  . pantoprazole (PROTONIX) 40 MG tablet Take 1 tablet (40 mg total) by mouth at bedtime.    . traZODone (DESYREL) 50 MG tablet Take 1 tablet (50 mg total) by mouth at bedtime as needed for sleep. 30 tablet 2  . vitamin B-12 1000 MCG tablet Take  1 tablet (1,000 mcg total) by mouth daily.     No current facility-administered medications for this visit.   Facility-Administered Medications Ordered in Other Visits  Medication Dose Route Frequency Provider Last Rate Last Admin  . 0.9 %  sodium chloride infusion   Intravenous Continuous Athena Masse, MD   Stopped at 07/19/19 1239  . acetaminophen (TYLENOL) tablet 650 mg  650 mg Oral Q6H PRN Athena Masse, MD       Or  . acetaminophen (TYLENOL) suppository 650 mg  650 mg Rectal Q6H PRN Athena Masse, MD      . ARIPiprazole (ABILIFY) tablet 10 mg  10 mg Oral QHS Athena Masse, MD   10 mg at 07/18/19 2153  . aspirin EC tablet 81 mg  81 mg Oral Daily Athena Masse, MD   81 mg at 07/19/19 8315  . atorvastatin (LIPITOR) tablet 40 mg  40 mg Oral Daily Athena Masse, MD   40 mg at 07/19/19 0927  . buPROPion (WELLBUTRIN XL) 24 hr tablet 150 mg  150 mg Oral q morning - 10a Athena Masse, MD   150 mg at 07/19/19 0927  . cefTRIAXone (ROCEPHIN) 2 g in sodium chloride 0.9 % 100 mL IVPB  2 g Intravenous Q24H Judd Gaudier V, MD 200 mL/hr at 07/18/19 2058 2 g at 07/18/19  2058  . colestipol (COLESTID) tablet 2 g  2 g Oral BID Lin Landsman, MD   2 g at 07/19/19 0927  . enoxaparin (LOVENOX) injection 40 mg  40 mg Subcutaneous Q24H Athena Masse, MD   40 mg at 07/18/19 2213  . HYDROcodone-acetaminophen (NORCO/VICODIN) 5-325 MG per tablet 1-2 tablet  1-2 tablet Oral Q4H PRN Judd Gaudier V, MD      . insulin aspart (novoLOG) injection 0-15 Units  0-15 Units Subcutaneous TID WC Athena Masse, MD   2 Units at 07/19/19 1243  . insulin glargine (LANTUS) injection 15 Units  15 Units Subcutaneous Daily Athena Masse, MD   15 Units at 07/18/19 2257  . metoprolol succinate (TOPROL-XL) 24 hr tablet 50 mg  50 mg Oral Daily Max Sane, MD   50 mg at 07/19/19 0931  . metroNIDAZOLE (FLAGYL) IVPB 500 mg  500 mg Intravenous Q8H Judd Gaudier V, MD 100 mL/hr at 07/19/19 0436 500 mg at 07/19/19 0436  . ondansetron (ZOFRAN) tablet 4 mg  4 mg Oral Q6H PRN Athena Masse, MD       Or  . ondansetron [facility]) injection 4 mg  4 mg Intravenous Q6H PRN Athena Masse, MD      . pantoprazole (PROTONIX) EC tablet 40 mg  40 mg Oral QHS Athena Masse, MD   40 mg at 07/18/19 2214  . vitamin B-12 (CYANOCOBALAMIN) tablet 1,000 mcg  1,000 mcg Oral Daily Athena Masse, MD   1,000 mcg at 07/19/19 1761    OBJECTIVE: There were no vitals filed for this visit.   There is no height or weight on file to calculate BMI.    ECOG FS:1 - Symptomatic but completely ambulatory  General: Well-developed, well-nourished, no acute distress. Eyes: Pink conjunctiva, anicteric sclera. HEENT: Normocephalic, moist mucous membranes. Lungs: No audible wheezing or coughing. Heart: Regular rate and rhythm. Abdomen: Soft, nontender, no obvious distention. Musculoskeletal: No edema, cyanosis, or clubbing. Neuro: Alert, answering all questions appropriately. Cranial nerves grossly intact. Skin: No rashes or petechiae noted. Psych: Normal affect.  LAB RESULTS:  Lab Results  Component Value Date    NA 139 07/19/2019   K 5.6 (H) 07/19/2019   CL 115 (H) 07/19/2019   CO2 15 (L) 07/19/2019   GLUCOSE 122 (H) 07/19/2019   BUN 29 (H) 07/19/2019   CREATININE 1.32 (H) 07/19/2019   CALCIUM 9.7 07/19/2019   PROT 6.4 (L) 07/18/2019   ALBUMIN 3.2 (L) 07/18/2019   AST 20 07/18/2019   ALT 22 07/18/2019   ALKPHOS 98 07/18/2019   BILITOT 0.4 07/18/2019   GFRNONAA 45 (L) 07/19/2019   GFRAA 53 (L) 07/19/2019    Lab Results  Component Value Date   WBC 8.2 07/19/2019   NEUTROABS 9.8 (H) 06/04/2019   HGB 8.2 (L) 07/19/2019   HCT 26.7 (L) 07/19/2019   MCV 81.4 07/19/2019   PLT 281 07/19/2019     STUDIES: CT ABDOMEN PELVIS WO CONTRAST  Result Date: 07/17/2019 CLINICAL DATA:  Diarrhea, weakness and dizziness, history of diabetes and cholecystectomy EXAM: CT ABDOMEN AND PELVIS WITHOUT CONTRAST TECHNIQUE: Multidetector CT imaging of the abdomen and pelvis was performed following the standard protocol without IV contrast. COMPARISON:  CT abdomen pelvis 05/26/2019 FINDINGS: Lower chest: Lung bases are clear. Normal heart size. No pericardial effusion. Small soft tissue nodule contiguous with the dermis, likely dermal inclusion cyst measuring 1 cm in size (3/12) seen along the left posterior chest wall, unchanged from prior. Hepatobiliary: No focal liver abnormality is seen. Patient is post cholecystectomy. Slight prominence of the biliary tree likely related to reservoir effect. No calcified intraductal gallstones. Pancreas: Unremarkable. No pancreatic ductal dilatation or surrounding inflammatory changes. Spleen: Normal in size without focal abnormality. Adrenals/Urinary Tract: 1.8 cm nodule in the body of the left adrenal gland measures 14 HU, unchanged from prior. No right adrenal nodule. Stable mild bilateral symmetric perinephric stranding, a nonspecific finding though may correlate with either age or decreased renal function. No visible or contour deforming renal lesions. No urolithiasis or  hydronephrosis. Urinary bladder is largely decompressed at the time of exam and therefore poorly evaluated by CT imaging. Stomach/Bowel: Distal esophagus, stomach and duodenal sweep are unremarkable. No small bowel wall thickening or dilatation. No evidence of obstruction. The appendix is not well visualized. No pericecal inflammation to suggest an occult appendicitis. Much of the colon is fluid-filled with a lack of formed stool. Patent colocolonic anastomosis in the level of the sigmoid. Vascular/Lymphatic: Atherosclerotic plaque within the normal caliber aorta. Some focal stranding appears centered upon the proximal segment of the IMA (3/57). Vascular evaluation is limited in the absence of contrast media though feasibly may be postoperative given history of recent colonic resection. No other acute vascular abnormality is seen. Few reactive lymph nodes in the adjacent retroperitoneum. No pathologically enlarged abdominopelvic nodes. Reproductive: Anteverted uterus. Stable appearance of a 4 x 3.6 x 3.1 cm dermoid in the right adnexa. No new or concerning adnexal lesions. Other: No free air or free fluid. No bowel containing hernia. Linear soft tissue stranding in the right lower quadrant extending to the abdominal wall has an appearance most compatible with port placement. Correlate for history of laparoscopic intervention. Musculoskeletal: No acute osseous abnormality or suspicious osseous lesion. IMPRESSION: 1. Expected postsurgical changes following sigmoid resection. No evidence of obstruction at the anastomotic site in the pelvis. 2. Much of the colon is fluid-filled with a lack of formed stool. This finding can be seen with a diarrheal illness. 3. Some focal stranding appears centered upon the proximal segment of the IMA. Vascular evaluation is limited in the absence of contrast  media. This may be a postoperative change given recent sigmoid resection however if there is clinical concern for a vascular issue,  consider CT angiography of the abdomen and pelvis. 4. Stable appearance of a 4 cm right adnexal dermoid. 5. Stable 1.8 cm left adrenal nodule. 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 6. Stable mild bilateral perinephric stranding, a nonspecific finding though may correlate with either age or decreased renal function. 7. Aortic Atherosclerosis (ICD10-I70.0). Electronically Signed   By: Lovena Le M.D.   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  PLAN:    1. 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 Final pathology and imaging reviewed independently.  Patient had a T2, N0, M0 lesion which does not require adjuvant chemotherapy.  No further interventions are needed.    CEA is within normal limits.  Continue follow-up with surgery as indicated.  Return to clinic in 3 months with repeat laboratory work and further evaluation.  Patient will require repeat colonoscopy in 6 to 12 months.  I spent a total of 30 minutes reviewing chart data, face-to-face evaluation with the patient, counseling and coordination of care as detailed above.   Patient expressed understanding and was in agreement with this plan. She also understands that She can call clinic at any time with any questions, concerns, or complaints.   Cancer Staging Adenocarcinoma of sigmoid colon [facility]) Staging form: Colon and Rectum, AJCC 8th Edition - Clinical stage from 06/07/2019: Stage I (cT2, cN0, cM0) - Signed by Lloyd Huger, MD on 06/07/2019   Lloyd Huger, MD   07/19/2019 4:11 PM

## 2019-07-20 LAB — GI PATHOGEN PANEL BY PCR, STOOL

## 2019-07-20 NOTE — Discharge Summary (Signed)
[facility] [place] NAME: [name]    MR#:  [id number]  DATE OF BIRTH:  [birth year]  DATE OF ADMISSION:  07/17/2019   ADMITTING PHYSICIAN: Max Sane, MD  DATE OF DISCHARGE: 07/19/2019  4:07 PM  PRIMARY CARE PHYSICIAN: [name], DO   ADMISSION DIAGNOSIS:  Hyperkalemia [E87.5] Acute diarrhea [R19.7] Acute kidney injury ([place]) [N17.9] Anemia [D64.9] DISCHARGE DIAGNOSIS:  Principal Problem:   Acute diarrhea Active Problems:   Essential hypertension   Schizoaffective disorder (HCC)   Hyperkalemia   Anemia   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HCC)   Diabetes mellitus with polyneuropathy (HCC)   S/P partial colectomy   Acute kidney injury ([place])   Severe sepsis ([place])  SECONDARY DIAGNOSIS:   Past Medical History:  Diagnosis Date  . Depression   . Diabetes mellitus without complication ([place])   . Hyperlipidemia   . Hypertension   . Neuromuscular disorder ([place])   . Schizo affective schizophrenia ([place])    abilify and pazil   HOSPITAL COURSE:  [name] a 54 y.o.femalewith metabolic syndrome, poorly controlled type 2 diabetes, stage I sigmoid colon cancer s/p partial colectomy in 05/2019 with primary anastomosis, s/p cholecystectomy more than 5 years ago now admitted with nausea, vomiting and nonbloody diarrhea, also found to have AKI on CKD and worsening anemia.  Severe sepsis ([place]) - ruled out Acute on chronic diarrhea  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HCC) S/P partial colectomy Patient presented with a 3-day history of protracted diarrhea up to 20 times a day, with tachycardia, hypotension, acute kidney injury -CT abdomen and pelvis done in the emergency room the day prior was mostly consistent with acute diarrheal illness but with recommendation for further evaluation with CT angiogram with concern for ischemic bowel -C Diff neg, neg blood cultures -diarrhea resolved while in the Hospital.  - GI  recommends Trial of colestipol 2 g twice daily - possible underlying diabetic gastroparesis in setting of poorly controlled diabetes  * Acute on chronic blood loss anemia with underlying malignancy, IDA Hb 6.9 s/p 1 PRBC transfusion and parenteral iron therapy while in the Hospital Hb 8.2 on the day of D/C. GI recommends outpt f/up. No luminal eval planned in the Hospital  AKI (acute kidney injury) ([place]) - present on admission and resolved with hydration. Hyperkalemia - resolved s/p Lokelma and valtessa. 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  - outpt nephro f/up. Stop ace-I for now till recheck BMP as an outpt  Essential hypertension -help all BP meds while in the hospital due to hypotension. Resume meds at D/C as BP stabilized except ACE-I  Schizoaffective disorder ([place]) -Continue home meds  Diabetes mellitus with polyneuropathy ([place]) -continue home meds DISCHARGE CONDITIONS:  stable CONSULTS OBTAINED:  Treatment Team:  Lin Landsman, MD DRUG ALLERGIES:  No Known Allergies DISCHARGE MEDICATIONS:   Allergies as of 07/19/2019   No Known Allergies     Medication List    STOP taking these medications   lisinopril 20 MG tablet Commonly known as: ZESTRIL   metroNIDAZOLE 500 MG tablet Commonly known as: FLAGYL     TAKE these medications   acetaminophen 325 MG tablet Commonly known as: TYLENOL Take 2 tablets (650 mg total) by mouth every 6 (six) hours as needed for mild pain.   ARIPiprazole 10 MG tablet Commonly known as: ABILIFY Take 10 mg by mouth at bedtime.   ascorbic acid 500 MG tablet Commonly known as: VITAMIN C Take 500 mg by mouth  daily.   aspirin EC 81 MG tablet Take 81 mg by mouth daily.   atorvastatin 40 MG tablet Commonly known as: LIPITOR Take 1 tablet (40 mg total) by mouth daily.   buPROPion 150 MG 24 hr tablet Commonly known as: WELLBUTRIN XL Take 150 mg by mouth every morning.   colestipol 1 g tablet Commonly known as:  COLESTID Take 2 tablets (2 g total) by mouth 2 (two) times daily.   cyanocobalamin 1000 MCG tablet Take 1 tablet (1,000 mcg total) by mouth daily.   Ferrous Sulfate 27 MG Tabs Take 1 tablet by mouth 2 (two) times daily with a meal.   furosemide 40 MG tablet Commonly known as: Lasix Take 1 tablet (40 mg total) by mouth daily.   gabapentin 300 MG capsule Commonly known as: NEURONTIN Take 1 capsule (300 mg total) by mouth at bedtime.   hydrochlorothiazide 12.5 MG tablet Commonly known as: HYDRODIURIL Take 2 tablets (25 mg total) by mouth daily.   insulin aspart 100 UNIT/ML injection Commonly known as: novoLOG Inject 0-15 Units into the skin 3 (three) times daily with meals.   Lantus SoloStar 100 UNIT/ML Solostar Pen Generic drug: insulin glargine Inject 60 Units into the skin daily before breakfast.   metFORMIN 1000 MG tablet Commonly known as: GLUCOPHAGE Take 1 tablet (1,000 mg total) by mouth 2 (two) times daily with a meal.   metoprolol succinate 50 MG 24 hr tablet Commonly known as: TOPROL-XL Take 1 tablet (50 mg total) by mouth daily. Take with or immediately following a meal.   ondansetron 4 MG tablet Commonly known as: ZOFRAN Take 1 tablet (4 mg total) by mouth every 6 (six) hours. What changed:  when to take this reasons to take this   onetouch ultrasoft lancets Use to check blood sugar up to 3 x daily   OneTouch Verio test strip Generic drug: glucose blood Check blood sugar up to 5 x daily as needed   pantoprazole 40 MG tablet Commonly known as: PROTONIX Take 1 tablet (40 mg total) by mouth at bedtime.   traZODone 50 MG tablet Commonly known as: DESYREL Take 1 tablet (50 mg total) by mouth at bedtime as needed for sleep.      DISCHARGE INSTRUCTIONS:   DIET:  Diabetic diet DISCHARGE CONDITION:  Stable ACTIVITY:  Activity as tolerated OXYGEN:  Home Oxygen: No.  Oxygen Delivery: room air DISCHARGE LOCATION:  home   If you experience  worsening of your admission symptoms, develop shortness of breath, life threatening emergency, suicidal or homicidal thoughts you must seek medical attention immediately by calling 911 or calling your MD immediately  if symptoms less severe.  You Must read complete instructions/literature along with all the possible adverse reactions/side effects for all the Medicines you take and that have been prescribed to you. Take any new Medicines after you have completely understood and accpet all the possible adverse reactions/side effects.   Please note  You were cared for by a hospitalist during your hospital stay. If you have any questions about your discharge medications or the care you received while you were in the hospital after you are discharged, you can call the unit and asked to speak with the hospitalist on call if the hospitalist that took care of you is not available. Once you are discharged, your primary care physician will handle any further medical issues. Please note that NO REFILLS for any discharge medications will be authorized once you are discharged, as it is imperative that you  return to your primary care physician (or establish a relationship with a primary care physician if you do not have one) for your aftercare needs so that they can reassess your need for medications and monitor your lab values.    On the day of Discharge:  VITAL SIGNS:  Blood pressure 131/72, pulse 92, temperature 97.9 F (36.6 C), temperature source Oral, resp. rate 19, height 5\' 8"  (1.727 m), weight 118 kg, SpO2 98 %. PHYSICAL EXAMINATION:  GENERAL:  54 y.o.-year-old patient lying in the bed with no acute distress.  EYES: Pupils equal, round, reactive to light and accommodation. No scleral icterus. Extraocular muscles intact.  HEENT: Head atraumatic, normocephalic. Oropharynx and nasopharynx clear.  NECK:  Supple, no jugular venous distention. No thyroid enlargement, no tenderness.  LUNGS: Normal breath  sounds bilaterally, no wheezing, rales,rhonchi or crepitation. No use of accessory muscles of respiration.  CARDIOVASCULAR: S1, S2 normal. No murmurs, rubs, or gallops.  ABDOMEN: Soft, non-tender, non-distended. Bowel sounds present. No organomegaly or mass.  EXTREMITIES: No pedal edema, cyanosis, or clubbing.  NEUROLOGIC: Cranial nerves II through XII are intact. Muscle strength 5/5 in all extremities. Sensation intact. Gait not checked.  PSYCHIATRIC: The patient is alert and oriented x 3.  SKIN: No obvious rash, lesion, or ulcer.  DATA REVIEW:   CBC Recent Labs  Lab 07/19/19 0416  WBC 8.2  HGB 8.2*  HCT 26.7*  PLT 281    Chemistries  Recent Labs  Lab 07/18/19 0354 07/18/19 0354 07/19/19 0416 07/19/19 1314 07/19/19 1449  NA 137   < > 139  --   --   K 5.8*   < > 5.9*   < > 5.6*  CL 115*   < > 115*  --   --   CO2 15*   < > 15*  --   --   GLUCOSE 110*   < > 122*  --   --   BUN 38*   < > 29*  --   --   CREATININE 1.36*   < > 1.32*  --   --   CALCIUM 9.3   < > 9.7  --   --   AST 20  --   --   --   --   ALT 22  --   --   --   --   ALKPHOS 98  --   --   --   --   BILITOT 0.4  --   --   --   --    < > = values in this interval not displayed.     Outpatient follow-up Follow-up Information    [name], DO. Go on 07/23/2019.   Specialty: Family Medicine Why: @1 :20 PM Contact information: [place] [address] [phone]        Lloyd Huger, MD. Go on 07/26/2019.   Specialty: Oncology Why: @10 :30 AM Contact information: [place] [address] [phone]        Lin Landsman, MD. Go on 08/21/2019.   Why: @1 :15 PM  Contact information: [address]  [address] [place]. [address] [phone]       Lavonia Dana, MD. Schedule an appointment as soon as possible for a visit in 1 week(s).   Specialty: Nephrology Why: recheck BMP and monitor K Contact information: [address] [place] [address] [phone]            Management  plans discussed with the patient, family and they are in agreement.  CODE STATUS: Prior   TOTAL TIME TAKING CARE OF THIS PATIENT: 45 minutes.    Max Sane M.D on 07/20/2019 at 4:33 PM  [facility]   CC: Primary care physician; [name], DO   Note: This dictation was prepared with Dragon dictation along with smaller phrase technology. Any transcriptional errors that result from this process are unintentional.

## 2019-07-22 ENCOUNTER — Telehealth: Payer: Self-pay

## 2019-07-22 ENCOUNTER — Ambulatory Visit: Payer: Self-pay | Admitting: Pharmacist

## 2019-07-22 DIAGNOSIS — E785 Hyperlipidemia, unspecified: Secondary | ICD-10-CM | POA: Diagnosis not present

## 2019-07-22 DIAGNOSIS — I10 Essential (primary) hypertension: Secondary | ICD-10-CM | POA: Diagnosis not present

## 2019-07-22 DIAGNOSIS — E1165 Type 2 diabetes mellitus with hyperglycemia: Secondary | ICD-10-CM

## 2019-07-22 DIAGNOSIS — E1169 Type 2 diabetes mellitus with other specified complication: Secondary | ICD-10-CM | POA: Diagnosis not present

## 2019-07-22 DIAGNOSIS — E1149 Type 2 diabetes mellitus with other diabetic neurological complication: Secondary | ICD-10-CM

## 2019-07-22 LAB — CULTURE, BLOOD (ROUTINE X 2)
Culture: NO GROWTH
Culture: NO GROWTH

## 2019-07-22 NOTE — Chronic Care Management (AMB) (Signed)
Chronic Care Management   Follow Up Note   07/22/2019 Name: [name] MRN: [id number] DOB: [birth year]  Referred by: [name], DO Reason for referral : Chronic Care Management (Patient/Caregiver Phone Call)   [name] is a 54 y.o. year old female who is a primary care patient of [name], DO. The CCM team was consulted for assistance with chronic disease management and care coordination needs.  [name] has a past medical history including but not limited to type 2 diabetes, adenocarcinoma of sigmoid colon, hypertension and hyperlipidemia.  Note patient admitted to [facility] 3/17-3/19 for acute diarrhea.  Was unable to reach patient or daughter via telephone today and unable to leave a message as no voicemail is setup.  Review of patient status, including review of consultants reports, relevant laboratory and other test results, and collaboration with appropriate care team members and the patient's provider was performed as part of comprehensive patient evaluation and provision of chronic care management services.     Outpatient Encounter Medications as of 07/22/2019  Medication Sig Note  . acetaminophen (TYLENOL) 325 MG tablet Take 2 tablets (650 mg total) by mouth every 6 (six) hours as needed for mild pain.   . ARIPiprazole (ABILIFY) 10 MG tablet Take 10 mg by mouth at bedtime.    [name] ascorbic acid (VITAMIN C) 500 MG tablet Take 500 mg by mouth daily.   [name] aspirin EC 81 MG tablet Take 81 mg by mouth daily.    [name] atorvastatin (LIPITOR) 40 MG tablet Take 1 tablet (40 mg total) by mouth daily.   [name] buPROPion (WELLBUTRIN XL) 150 MG 24 hr tablet Take 150 mg by mouth every morning.    . colestipol (COLESTID) 1 g tablet Take 2 tablets (2 g total) by mouth 2 (two) times daily.   . Ferrous Sulfate 27 MG TABS Take 1 tablet by mouth 2 (two) times daily with a meal.    . furosemide (LASIX) 40 MG tablet Take 1 tablet (40 mg  total) by mouth daily.   [name] gabapentin (NEURONTIN) 300 MG capsule Take 1 capsule (300 mg total) by mouth at bedtime.   . hydrochlorothiazide (HYDRODIURIL) 12.5 MG tablet Take 2 tablets (25 mg total) by mouth daily.   . insulin aspart (NOVOLOG) 100 UNIT/ML injection Inject 0-15 Units into the skin 3 (three) times daily with meals.   . Insulin Glargine (LANTUS SOLOSTAR) 100 UNIT/ML Solostar Pen Inject 60 Units into the skin daily before breakfast.   . Lancets (ONETOUCH ULTRASOFT) lancets Use to check blood sugar up to 3 x daily   . metFORMIN (GLUCOPHAGE) 1000 MG tablet Take 1 tablet (1,000 mg total) by mouth 2 (two) times daily with a meal.   . metoprolol succinate (TOPROL-XL) 50 MG 24 hr tablet Take 1 tablet (50 mg total) by mouth daily. Take with or immediately following a meal.   . ondansetron (ZOFRAN) 4 MG tablet Take 1 tablet (4 mg total) by mouth every 6 (six) hours. (Patient taking differently: Take 4 mg by mouth every 8 (eight) hours as needed for nausea or vomiting. )   . ONETOUCH VERIO test strip Check blood sugar up to 5 x daily as needed   . pantoprazole (PROTONIX) 40 MG tablet Take 1 tablet (40 mg total) by mouth at bedtime.   . traZODone (DESYREL) 50 MG tablet Take 1 tablet (50 mg total) by mouth at bedtime as needed for sleep. 07/17/2019: Have not started   .  vitamin B-12 1000 MCG tablet Take 1 tablet (1,000 mcg total) by mouth daily.    No facility-administered encounter medications on file as of 07/22/2019.    Goals Addressed            This Visit's Progress   . PharmD - Medication Management       Current Barriers:  . Financial Barriers . Non adherence to prescribed medication regimen . Lack of BP results for clinical team  Pharmacist Clinical Goal(s):  [name] Over the next 30 days, patient will work with [name] to address needs related to optimization of medication regimen and improvement of medication adherence  Interventions: . Perform chart review o Patient  admitted to [facility] 3/17 to 3/19 for acute diarrhea - At discharge, patient instructed to STOP: . lisinopril . metronidazole - At discharge, patient instructed to START: . colestipol 2 g twice daily  . metoprolol ER 50 mg once daily . Unable to reach patient or daughter by phone today  Patient Self Care Activities:  . Self administers medications as prescribed o Using weekly pillbox and calendar as adherence tools . Attends all scheduled provider appointments o Next appointment with Endocrinology: 3/24 o Next appointment with Oncology: 5/11 . Calls pharmacy for medication refills . Calls provider office for new concerns or questions . Patient to check blood sugar as directed by Endocrinologist and keep log  Please see past updates related to this goal by clicking on the "Past Updates" button in the selected goal         Plan  The care management team will reach out to the patient again over the next 30 days.   Harlow Asa, PharmD, [place] [employer] [phone]

## 2019-07-22 NOTE — Telephone Encounter (Signed)
I have made the 1st attempt to contact the patient or family member in charge, in order to follow up from recently being discharged from the hospital. I was unable to leave a message on voicemail but I will make another attempt at a different time.  

## 2019-07-22 NOTE — Telephone Encounter (Signed)
Patient's daughter notified.

## 2019-07-23 ENCOUNTER — Inpatient Hospital Stay: Payer: Medicare Other | Admitting: Family Medicine

## 2019-07-23 DIAGNOSIS — M6281 Muscle weakness (generalized): Secondary | ICD-10-CM | POA: Diagnosis not present

## 2019-07-23 DIAGNOSIS — K219 Gastro-esophageal reflux disease without esophagitis: Secondary | ICD-10-CM | POA: Diagnosis not present

## 2019-07-23 DIAGNOSIS — D62 Acute posthemorrhagic anemia: Secondary | ICD-10-CM | POA: Diagnosis not present

## 2019-07-23 DIAGNOSIS — Z9049 Acquired absence of other specified parts of digestive tract: Secondary | ICD-10-CM | POA: Diagnosis not present

## 2019-07-23 DIAGNOSIS — C187 Malignant neoplasm of sigmoid colon: Secondary | ICD-10-CM | POA: Diagnosis not present

## 2019-07-23 DIAGNOSIS — E1142 Type 2 diabetes mellitus with diabetic polyneuropathy: Secondary | ICD-10-CM | POA: Diagnosis not present

## 2019-07-23 DIAGNOSIS — Z8616 Personal history of COVID-19: Secondary | ICD-10-CM | POA: Diagnosis not present

## 2019-07-23 DIAGNOSIS — Z87891 Personal history of nicotine dependence: Secondary | ICD-10-CM | POA: Diagnosis not present

## 2019-07-23 DIAGNOSIS — E11610 Type 2 diabetes mellitus with diabetic neuropathic arthropathy: Secondary | ICD-10-CM | POA: Diagnosis not present

## 2019-07-23 DIAGNOSIS — M2041 Other hammer toe(s) (acquired), right foot: Secondary | ICD-10-CM | POA: Diagnosis not present

## 2019-07-23 DIAGNOSIS — E1165 Type 2 diabetes mellitus with hyperglycemia: Secondary | ICD-10-CM | POA: Diagnosis not present

## 2019-07-23 DIAGNOSIS — Z483 Aftercare following surgery for neoplasm: Secondary | ICD-10-CM | POA: Diagnosis not present

## 2019-07-23 DIAGNOSIS — E7849 Other hyperlipidemia: Secondary | ICD-10-CM | POA: Diagnosis not present

## 2019-07-23 DIAGNOSIS — Z794 Long term (current) use of insulin: Secondary | ICD-10-CM | POA: Diagnosis not present

## 2019-07-23 DIAGNOSIS — I10 Essential (primary) hypertension: Secondary | ICD-10-CM | POA: Diagnosis not present

## 2019-07-23 DIAGNOSIS — Z8631 Personal history of diabetic foot ulcer: Secondary | ICD-10-CM | POA: Diagnosis not present

## 2019-07-24 ENCOUNTER — Ambulatory Visit (INDEPENDENT_AMBULATORY_CARE_PROVIDER_SITE_OTHER): Payer: Medicare Other | Admitting: Family Medicine

## 2019-07-24 ENCOUNTER — Other Ambulatory Visit: Payer: Self-pay

## 2019-07-24 ENCOUNTER — Encounter: Payer: Self-pay | Admitting: Family Medicine

## 2019-07-24 ENCOUNTER — Other Ambulatory Visit: Payer: Self-pay | Admitting: Emergency Medicine

## 2019-07-24 ENCOUNTER — Encounter: Payer: Self-pay | Admitting: Oncology

## 2019-07-24 ENCOUNTER — Other Ambulatory Visit: Payer: Medicare Other

## 2019-07-24 VITALS — BP 136/77 | HR 82 | Temp 97.7°F | Resp 16 | Ht 70.5 in | Wt 275.0 lb

## 2019-07-24 DIAGNOSIS — E875 Hyperkalemia: Secondary | ICD-10-CM

## 2019-07-24 DIAGNOSIS — Z9049 Acquired absence of other specified parts of digestive tract: Secondary | ICD-10-CM

## 2019-07-24 DIAGNOSIS — E1159 Type 2 diabetes mellitus with other circulatory complications: Secondary | ICD-10-CM | POA: Diagnosis not present

## 2019-07-24 DIAGNOSIS — E1149 Type 2 diabetes mellitus with other diabetic neurological complication: Secondary | ICD-10-CM

## 2019-07-24 DIAGNOSIS — R197 Diarrhea, unspecified: Secondary | ICD-10-CM

## 2019-07-24 DIAGNOSIS — Z9119 Patient's noncompliance with other medical treatment and regimen: Secondary | ICD-10-CM | POA: Diagnosis not present

## 2019-07-24 DIAGNOSIS — K219 Gastro-esophageal reflux disease without esophagitis: Secondary | ICD-10-CM

## 2019-07-24 DIAGNOSIS — E1165 Type 2 diabetes mellitus with hyperglycemia: Secondary | ICD-10-CM | POA: Diagnosis not present

## 2019-07-24 DIAGNOSIS — M7989 Other specified soft tissue disorders: Secondary | ICD-10-CM | POA: Diagnosis not present

## 2019-07-24 DIAGNOSIS — E782 Mixed hyperlipidemia: Secondary | ICD-10-CM | POA: Diagnosis not present

## 2019-07-24 DIAGNOSIS — IMO0002 Reserved for concepts with insufficient information to code with codable children: Secondary | ICD-10-CM

## 2019-07-24 DIAGNOSIS — N179 Acute kidney failure, unspecified: Secondary | ICD-10-CM | POA: Diagnosis not present

## 2019-07-24 DIAGNOSIS — L853 Xerosis cutis: Secondary | ICD-10-CM | POA: Diagnosis not present

## 2019-07-24 DIAGNOSIS — E1129 Type 2 diabetes mellitus with other diabetic kidney complication: Secondary | ICD-10-CM | POA: Diagnosis not present

## 2019-07-24 DIAGNOSIS — C187 Malignant neoplasm of sigmoid colon: Secondary | ICD-10-CM

## 2019-07-24 DIAGNOSIS — M14672 Charcot's joint, left ankle and foot: Secondary | ICD-10-CM | POA: Diagnosis not present

## 2019-07-24 DIAGNOSIS — D500 Iron deficiency anemia secondary to blood loss (chronic): Secondary | ICD-10-CM | POA: Diagnosis not present

## 2019-07-24 DIAGNOSIS — E1142 Type 2 diabetes mellitus with diabetic polyneuropathy: Secondary | ICD-10-CM | POA: Diagnosis not present

## 2019-07-24 NOTE — Progress Notes (Signed)
Patient states she is having a lot of nausea after eating.

## 2019-07-24 NOTE — Telephone Encounter (Signed)
I have made the 2nd attempt to contact the patient or family member in charge, in order to follow up from recently being discharged from the hospital.  

## 2019-07-24 NOTE — Patient Instructions (Addendum)
Thank you for coming to the office today.  Stop taking Lisinopril 20mg  going forward. This can increase Potassium, so we need to be careful.  Labs today to check Creatinine Kidney Potassium and Hemoglobin.  Even if potassium is normal, we may pause on Lisinopril for a little while. Can restart in future.  Take Iron supplement as you are twice a day with food, caution upset stomach on this. May need IV Iron from Hematologist.  For acid reflux or indigestion - double dose of Pantoprazole 40mg  - now take one TWICE a day for now. Can ask GI as well.  Concerned with Diabetic Gastroparesis   Please schedule a Follow-up Appointment to: Return in about 3 months (around 10/24/2019) for 3 month DM A1c.  If you have any other questions or concerns, please feel free to call the office or send a message through [facility]. You may also schedule an earlier appointment if necessary.  Additionally, you may be receiving a survey about your experience at our office within a few days to 1 week by e-mail or mail. We value your feedback.  [name], DO [employer]

## 2019-07-24 NOTE — Progress Notes (Signed)
Subjective:    Patient ID: [name], female    DOB: [birth year], 54 y.o.   MRN: [id number]  [name] is a 54 y.o. female presenting on 07/24/2019 for Hospitalization Follow-up (hyperkalemia)  History provided by patient and daughter [facility] FOLLOW-UP VISIT  Hospital/Location: [place] Date of Admission: 07/17/19 Date of Discharge: 07/19/19 Transitions of care telephone call: Attempted by Tyler Aas LPN on 7/82/95 did not reach patient.  Reason for Admission / Dx: Sepsis, Diarrhea, Dehydration, HyperKalemia, AKI  - Hospital H&P and Discharge Summary have been reviewed - Patient presents today 5 days after recent hospitalization. Brief summary of recent course, patient had symptoms of diarrhea persistent and dehydration and abnormal lab result - left hospital previously then was worsening and sent back to hospital, hospitalized at [facility], treated with IVF rehydration, also GI consultation treated with colestipol 2g BID with s/p colectomy and has since improved discharged home.  F/u with Hematology/Oncology, GI, Gen Surgery within next few weeks already scheduled.  AKI - resolved on discharge. Due for repeat lab today HyperKalemia - still mild elevated. They recommended holding Lisinopril, patient was still taking on discharge. She is due for lab test. Acute on Chronic Blood Loss Anemia - No active bleeding reported. She is on iron supplement. She received PRBC transfusion, she has upcoming apt with Hematology DM Gastroparesis - future w GI GERD - has burning and indigestion at times still w/ eating, asking if she can adjust dose  - Today reports overall has done well after discharge. Symptoms of diarrhea has mostly resolved. Now has more well formed stool and less often.  Denies any fever chills sweats, abdominal pain, dark stool blood in stool, nausea vomiting, chest pain dyspnea, redness swelling worsening  I have reviewed the discharge medication  list, and have reconciled the current and discharge medications today.   Current Outpatient Medications:  .  acetaminophen (TYLENOL) 325 MG tablet, Take 2 tablets (650 mg total) by mouth every 6 (six) hours as needed for mild pain., Disp:  , Rfl:  .  ARIPiprazole (ABILIFY) 10 MG tablet, Take 10 mg by mouth at bedtime. , Disp: , Rfl:  .  ascorbic acid (VITAMIN C) 500 MG tablet, Take 500 mg by mouth daily., Disp: , Rfl:  .  aspirin EC 81 MG tablet, Take 81 mg by mouth daily. , Disp: , Rfl:  .  atorvastatin (LIPITOR) 40 MG tablet, Take 1 tablet (40 mg total) by mouth daily., Disp: 90 tablet, Rfl: 3 .  buPROPion (WELLBUTRIN XL) 150 MG 24 hr tablet, Take 150 mg by mouth every morning. , Disp: , Rfl:  .  colestipol (COLESTID) 1 g tablet, Take 2 tablets (2 g total) by mouth 2 (two) times daily., Disp: 20 tablet, Rfl: 0 .  Ferrous Sulfate 27 MG TABS, Take 1 tablet by mouth 2 (two) times daily with a meal. , Disp: , Rfl:  .  furosemide (LASIX) 40 MG tablet, Take 1 tablet (40 mg total) by mouth daily., Disp: 30 tablet, Rfl: 11 .  gabapentin (NEURONTIN) 300 MG capsule, Take 1 capsule (300 mg total) by mouth at bedtime., Disp:  , Rfl:  .  hydrochlorothiazide (HYDRODIURIL) 12.5 MG tablet, Take 2 tablets (25 mg total) by mouth daily., Disp: 90 tablet, Rfl: 1 .  insulin aspart (NOVOLOG) 100 UNIT/ML injection, Inject 0-15 Units into the skin 3 (three) times daily with meals., Disp: 10 mL, Rfl: 11 .  Insulin Glargine (LANTUS SOLOSTAR) 100 UNIT/ML Solostar  Pen, Inject 60 Units into the skin daily before breakfast., Disp: , Rfl:  .  Lancets (ONETOUCH ULTRASOFT) lancets, Use to check blood sugar up to 3 x daily, Disp: 100 each, Rfl: 12 .  metFORMIN (GLUCOPHAGE) 1000 MG tablet, Take 1 tablet (1,000 mg total) by mouth 2 (two) times daily with a meal., Disp: 180 tablet, Rfl: 3 .  metoprolol succinate (TOPROL-XL) 50 MG 24 hr tablet, Take 1 tablet (50 mg total) by mouth daily. Take with or immediately following a meal.,  Disp: 30 tablet, Rfl: 0 .  ondansetron (ZOFRAN) 4 MG tablet, Take 1 tablet (4 mg total) by mouth every 6 (six) hours. (Patient taking differently: Take 4 mg by mouth every 8 (eight) hours as needed for nausea or vomiting. ), Disp: 12 tablet, Rfl: 0 .  ONETOUCH VERIO test strip, Check blood sugar up to 5 x daily as needed, Disp: 450 each, Rfl: 3 .  pantoprazole (PROTONIX) 40 MG tablet, Take 1 tablet (40 mg total) by mouth 2 (two) times daily before a meal., Disp: 60 tablet, Rfl: 2 .  traZODone (DESYREL) 50 MG tablet, Take 1 tablet (50 mg total) by mouth at bedtime as needed for sleep., Disp: 30 tablet, Rfl: 2 .  vitamin B-12 1000 MCG tablet, Take 1 tablet (1,000 mcg total) by mouth daily., Disp:  , Rfl:  .  buPROPion (WELLBUTRIN XL) 300 MG 24 hr tablet, Take 300 mg by mouth daily., Disp: , Rfl:   ------------------------------------------------------------------------- Social History   Tobacco Use  . Smoking status: Former Smoker    Packs/day: 3.00    Years: 30.00    Pack years: 90.00    Types: Cigarettes    Quit date: 05/02/2009    Years since quitting: 10.2  . Smokeless tobacco: Never Used  . Tobacco comment: Pt reported  quitting in 2011  Substance Use Topics  . Alcohol use: No  . Drug use: No    Review of Systems Per HPI unless specifically indicated above     Objective:    BP 136/77   Pulse 82   Temp 97.7 F (36.5 C) (Temporal)   Resp 16   Ht 5' 10.5" (1.791 m)   Wt 275 lb (124.7 kg)   SpO2 99%   BMI 38.90 kg/m   Wt Readings from Last 3 Encounters:  07/24/19 275 lb (124.7 kg)  07/17/19 260 lb 2.3 oz (118 kg)  06/10/19 260 lb (117.9 kg)    Physical Exam Vitals and nursing note reviewed.  Constitutional:      General: She is not in acute distress.    Appearance: She is well-developed. She is not diaphoretic.     Comments: Chronically ill but currently well appearing, in wheelchair, obese, comfortable, cooperative  HENT:     Head: Normocephalic and atraumatic.    Eyes:     General:        Right eye: No discharge.        Left eye: No discharge.     Conjunctiva/sclera: Conjunctivae normal.  Neck:     Thyroid: No thyromegaly.  Cardiovascular:     Rate and Rhythm: Normal rate and regular rhythm.     Heart sounds: Normal heart sounds. No murmur.  Pulmonary:     Effort: Pulmonary effort is normal. No respiratory distress.     Breath sounds: Normal breath sounds. No wheezing or rales.  Musculoskeletal:        General: Normal range of motion.     Cervical back: Normal  range of motion and neck supple.     Right lower leg: Edema (+1 pitting edema) present.     Left lower leg: Edema (+1 pitting edema) present.  Lymphadenopathy:     Cervical: No cervical adenopathy.  Skin:    General: Skin is warm and dry.     Findings: No erythema or rash.  Neurological:     Mental Status: She is alert and oriented to person, place, and time.  Psychiatric:        Behavior: Behavior normal.     Comments: Well groomed, good eye contact, normal speech and thoughts      Results for orders placed or performed during the hospital encounter of 07/17/19  SARS CORONAVIRUS 2 (TAT 6-24 HRS) Nasopharyngeal Nasopharyngeal Swab   Specimen: Nasopharyngeal Swab  Result Value Ref Range   SARS Coronavirus 2 POSITIVE (A) NEGATIVE  Culture, blood (x 2)   Specimen: BLOOD  Result Value Ref Range   Specimen Description BLOOD BLH    Special Requests BOTTLES DRAWN AEROBIC AND ANAEROBIC BCHV    Culture      NO GROWTH 5 DAYS Performed at [facility], [address] [place], [place] [address]    Report Status 07/22/2019 FINAL   Culture, blood (x 2)   Specimen: BLOOD  Result Value Ref Range   Specimen Description BLOOD LAC    Special Requests BOTTLES DRAWN AEROBIC AND ANAEROBIC BCAV    Culture      NO GROWTH 5 DAYS Performed at [facility], [address] [place], [place] [address]    Report Status 07/22/2019 FINAL   Lipase, blood  Result Value Ref  Range   Lipase 37 11 - 51 U/L  Comprehensive metabolic panel  Result Value Ref Range   Sodium 134 (L) 135 - 145 mmol/L   Potassium 6.2 (H) 3.5 - 5.1 mmol/L   Chloride 111 98 - 111 mmol/L   CO2 13 (L) 22 - 32 mmol/L   Glucose, Bld 122 (H) 70 - 99 mg/dL   BUN 43 (H) 6 - 20 mg/dL   Creatinine, Ser 1.74 (H) 0.44 - 1.00 mg/dL   Calcium 9.9 8.9 - 10.3 mg/dL   Total Protein 7.4 6.5 - 8.1 g/dL   Albumin 3.9 3.5 - 5.0 g/dL   AST 28 15 - 41 U/L   ALT 27 0 - 44 U/L   Alkaline Phosphatase 113 38 - 126 U/L   Total Bilirubin 0.4 0.3 - 1.2 mg/dL   GFR calc non Af Amer 32 (L) >60 mL/min   GFR calc Af Amer 38 (L) >60 mL/min   Anion gap 10 5 - 15  CBC  Result Value Ref Range   WBC 10.7 (H) 4.0 - 10.5 K/uL   RBC 3.28 (L) 3.87 - 5.11 MIL/uL   Hemoglobin 7.9 (L) 12.0 - 15.0 g/dL   HCT 26.6 (L) 36.0 - 46.0 %   MCV 81.1 80.0 - 100.0 fL   MCH 24.1 (L) 26.0 - 34.0 pg   MCHC 29.7 (L) 30.0 - 36.0 g/dL   RDW 17.5 (H) 11.5 - 15.5 %   Platelets 291 150 - 400 K/uL   nRBC 0.0 0.0 - 0.2 %  Urinalysis, Complete w Microscopic  Result Value Ref Range   Color, Urine STRAW (A) YELLOW   APPearance CLEAR (A) CLEAR   Specific Gravity, Urine 1.006 1.005 - 1.030   pH 5.0 5.0 - 8.0   Glucose, UA NEGATIVE NEGATIVE mg/dL   Hgb urine dipstick  NEGATIVE NEGATIVE   Bilirubin Urine NEGATIVE NEGATIVE   Ketones, ur NEGATIVE NEGATIVE mg/dL   Protein, ur NEGATIVE NEGATIVE mg/dL   Nitrite NEGATIVE NEGATIVE   Leukocytes,Ua TRACE (A) NEGATIVE   RBC / HPF 0-5 0 - 5 RBC/hpf   WBC, UA 0-5 0 - 5 WBC/hpf   Bacteria, UA NONE SEEN NONE SEEN   Squamous Epithelial / LPF 0-5 0 - 5   Hyaline Casts, UA PRESENT   Lactic acid, plasma  Result Value Ref Range   Lactic Acid, Venous 1.1 0.5 - 1.9 mmol/L  Lactic acid, plasma  Result Value Ref Range   Lactic Acid, Venous 1.7 0.5 - 1.9 mmol/L  Glucose, capillary  Result Value Ref Range   Glucose-Capillary 54 (L) 70 - 99 mg/dL  CBC  Result Value Ref Range   WBC 9.7 4.0 - 10.5 K/uL    RBC 3.04 (L) 3.87 - 5.11 MIL/uL   Hemoglobin 7.4 (L) 12.0 - 15.0 g/dL   HCT 24.9 (L) 36.0 - 46.0 %   MCV 81.9 80.0 - 100.0 fL   MCH 24.3 (L) 26.0 - 34.0 pg   MCHC 29.7 (L) 30.0 - 36.0 g/dL   RDW 17.5 (H) 11.5 - 15.5 %   Platelets 275 150 - 400 K/uL   nRBC 0.0 0.0 - 0.2 %  Creatinine, serum  Result Value Ref Range   Creatinine, Ser 1.72 (H) 0.44 - 1.00 mg/dL   GFR calc non Af Amer 33 (L) >60 mL/min   GFR calc Af Amer 38 (L) >60 mL/min  Comprehensive metabolic panel  Result Value Ref Range   Sodium 137 135 - 145 mmol/L   Potassium 5.8 (H) 3.5 - 5.1 mmol/L   Chloride 115 (H) 98 - 111 mmol/L   CO2 15 (L) 22 - 32 mmol/L   Glucose, Bld 110 (H) 70 - 99 mg/dL   BUN 38 (H) 6 - 20 mg/dL   Creatinine, Ser 1.36 (H) 0.44 - 1.00 mg/dL   Calcium 9.3 8.9 - 10.3 mg/dL   Total Protein 6.4 (L) 6.5 - 8.1 g/dL   Albumin 3.2 (L) 3.5 - 5.0 g/dL   AST 20 15 - 41 U/L   ALT 22 0 - 44 U/L   Alkaline Phosphatase 98 38 - 126 U/L   Total Bilirubin 0.4 0.3 - 1.2 mg/dL   GFR calc non Af Amer 44 (L) >60 mL/min   GFR calc Af Amer 51 (L) >60 mL/min   Anion gap 7 5 - 15  CBC  Result Value Ref Range   WBC 7.5 4.0 - 10.5 K/uL   RBC 2.84 (L) 3.87 - 5.11 MIL/uL   Hemoglobin 6.9 (L) 12.0 - 15.0 g/dL   HCT 22.9 (L) 36.0 - 46.0 %   MCV 80.6 80.0 - 100.0 fL   MCH 24.3 (L) 26.0 - 34.0 pg   MCHC 30.1 30.0 - 36.0 g/dL   RDW 17.4 (H) 11.5 - 15.5 %   Platelets 240 150 - 400 K/uL   nRBC 0.0 0.0 - 0.2 %  Procalcitonin - Baseline  Result Value Ref Range   Procalcitonin <0.10 ng/mL  Procalcitonin  Result Value Ref Range   Procalcitonin <0.10 ng/mL  Glucose, capillary  Result Value Ref Range   Glucose-Capillary 191 (H) 70 - 99 mg/dL  Glucose, capillary  Result Value Ref Range   Glucose-Capillary 133 (H) 70 - 99 mg/dL   Comment 1 Notify RN   Glucose, capillary  Result Value Ref Range   Glucose-Capillary 122 (H)  70 - 99 mg/dL  Iron and TIBC  Result Value Ref Range   Iron 24 (L) 28 - 170 ug/dL   TIBC 288 250  - 450 ug/dL   Saturation Ratios 8 (L) 10.4 - 31.8 %   UIBC 264 ug/dL  Ferritin  Result Value Ref Range   Ferritin 40 11 - 307 ng/mL  Vitamin B12  Result Value Ref Range   Vitamin B-12 589 180 - 914 pg/mL  Folate  Result Value Ref Range   Folate 12.4 >5.9 ng/mL  Glucose, capillary  Result Value Ref Range   Glucose-Capillary 125 (H) 70 - 99 mg/dL  Procalcitonin  Result Value Ref Range   Procalcitonin <0.10 ng/mL  Glucose, capillary  Result Value Ref Range   Glucose-Capillary 136 (H) 70 - 99 mg/dL  CBC  Result Value Ref Range   WBC 8.2 4.0 - 10.5 K/uL   RBC 3.28 (L) 3.87 - 5.11 MIL/uL   Hemoglobin 8.2 (L) 12.0 - 15.0 g/dL   HCT 26.7 (L) 36.0 - 46.0 %   MCV 81.4 80.0 - 100.0 fL   MCH 25.0 (L) 26.0 - 34.0 pg   MCHC 30.7 30.0 - 36.0 g/dL   RDW 17.9 (H) 11.5 - 15.5 %   Platelets 281 150 - 400 K/uL   nRBC 0.0 0.0 - 0.2 %  Glucose, capillary  Result Value Ref Range   Glucose-Capillary 167 (H) 70 - 99 mg/dL  Glucose, capillary  Result Value Ref Range   Glucose-Capillary 117 (H) 70 - 99 mg/dL  Basic metabolic panel  Result Value Ref Range   Sodium 139 135 - 145 mmol/L   Potassium 5.9 (H) 3.5 - 5.1 mmol/L   Chloride 115 (H) 98 - 111 mmol/L   CO2 15 (L) 22 - 32 mmol/L   Glucose, Bld 122 (H) 70 - 99 mg/dL   BUN 29 (H) 6 - 20 mg/dL   Creatinine, Ser 1.32 (H) 0.44 - 1.00 mg/dL   Calcium 9.7 8.9 - 10.3 mg/dL   GFR calc non Af Amer 45 (L) >60 mL/min   GFR calc Af Amer 53 (L) >60 mL/min   Anion gap 9 5 - 15  Potassium  Result Value Ref Range   Potassium 5.8 (H) 3.5 - 5.1 mmol/L  Glucose, capillary  Result Value Ref Range   Glucose-Capillary 139 (H) 70 - 99 mg/dL  Potassium  Result Value Ref Range   Potassium 5.6 (H) 3.5 - 5.1 mmol/L  Type and screen [facility]  Result Value Ref Range   ABO/RH(D) O NEG    Antibody Screen NEG    Sample Expiration 07/21/2019,2359    Unit Number [id number]    Blood Component Type RBC LR PHER2    Unit division 00      Status of Unit ISSUED,FINAL    Transfusion Status OK TO TRANSFUSE    Crossmatch Result      Compatible Performed at [facility], [address] [place], [place] [address]   Prepare RBC (crossmatch)  Result Value Ref Range   Order Confirmation      ORDER PROCESSED BY BLOOD BANK Performed at [facility], [address] [place], [place] [address]   BPAM [facility]  Result Value Ref Range   ISSUE DATE / TIME [id number]    Blood Product Unit Number [id number]    PRODUCT CODE [id number]    Unit Type and Rh 9500    Blood Product Expiration Date 720947096283  Assessment & Plan:   Problem List Items Addressed This Visit    S/P partial colectomy   Hyperkalemia   Relevant Orders   COMPLETE METABOLIC PANEL WITH GFR   DM (diabetes mellitus), type 2, 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HCC)   Anemia   Relevant Orders   CBC with Differential/Platelet   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 Limited GI losses  #Anemia, acute on chronic blood loss Likely underlying colon cancer and prior surgery can be contributing factors Should follow-up as scheduled w/ Gen Surgery and Heme/Onc / GI now for further management S/p transfusion PRBC. On oral iron should continue BID for now Check CBC for Hgb trend  #Diarrhea, improving Likely secondary to recent partial colectomy resection Now improved on  colestipol it seems, f/u with GI, also can have gastroparesis DM  #Hyperkalemia Will re-check BMET with K trend HOLD Lisinopril 20mg  now until lab result and follow-up clinical course.  #GERD Symptom breakthrough can inc to PPI Pantoprazole 40mg  up to BID for now, can inc rx if needed in future.  Orders Placed This Encounter  Procedures  . COMPLETE METABOLIC PANEL WITH GFR  . CBC with Differential/Platelet     No orders of the defined types were placed in this encounter.   Follow up plan: Return in about 3 months (around 10/24/2019) for 3 month DM A1c.   [name], [facility] 07/24/2019, 10:24 AM

## 2019-07-25 ENCOUNTER — Inpatient Hospital Stay (HOSPITAL_BASED_OUTPATIENT_CLINIC_OR_DEPARTMENT_OTHER): Payer: Medicare Other | Admitting: Oncology

## 2019-07-25 ENCOUNTER — Encounter: Payer: Self-pay | Admitting: Surgery

## 2019-07-25 ENCOUNTER — Ambulatory Visit (INDEPENDENT_AMBULATORY_CARE_PROVIDER_SITE_OTHER): Payer: Self-pay | Admitting: Surgery

## 2019-07-25 ENCOUNTER — Inpatient Hospital Stay: Payer: Medicare Other | Attending: Oncology

## 2019-07-25 VITALS — BP 127/82 | HR 76 | Temp 97.7°F | Ht 70.5 in | Wt 270.0 lb

## 2019-07-25 VITALS — BP 112/71 | HR 68 | Temp 98.7°F | Resp 18 | Wt 274.0 lb

## 2019-07-25 DIAGNOSIS — C187 Malignant neoplasm of sigmoid colon: Secondary | ICD-10-CM | POA: Diagnosis not present

## 2019-07-25 DIAGNOSIS — E1122 Type 2 diabetes mellitus with diabetic chronic kidney disease: Secondary | ICD-10-CM | POA: Diagnosis not present

## 2019-07-25 DIAGNOSIS — R531 Weakness: Secondary | ICD-10-CM | POA: Insufficient documentation

## 2019-07-25 DIAGNOSIS — R5381 Other malaise: Secondary | ICD-10-CM | POA: Diagnosis not present

## 2019-07-25 DIAGNOSIS — R5382 Chronic fatigue, unspecified: Secondary | ICD-10-CM | POA: Diagnosis not present

## 2019-07-25 DIAGNOSIS — R110 Nausea: Secondary | ICD-10-CM | POA: Diagnosis not present

## 2019-07-25 DIAGNOSIS — Z7982 Long term (current) use of aspirin: Secondary | ICD-10-CM | POA: Diagnosis not present

## 2019-07-25 DIAGNOSIS — F329 Major depressive disorder, single episode, unspecified: Secondary | ICD-10-CM | POA: Diagnosis not present

## 2019-07-25 DIAGNOSIS — E785 Hyperlipidemia, unspecified: Secondary | ICD-10-CM | POA: Diagnosis not present

## 2019-07-25 DIAGNOSIS — Z794 Long term (current) use of insulin: Secondary | ICD-10-CM | POA: Diagnosis not present

## 2019-07-25 DIAGNOSIS — I129 Hypertensive chronic kidney disease with stage 1 through stage 4 chronic kidney disease, or unspecified chronic kidney disease: Secondary | ICD-10-CM | POA: Diagnosis not present

## 2019-07-25 DIAGNOSIS — D509 Iron deficiency anemia, unspecified: Secondary | ICD-10-CM | POA: Insufficient documentation

## 2019-07-25 DIAGNOSIS — Z79899 Other long term (current) drug therapy: Secondary | ICD-10-CM | POA: Insufficient documentation

## 2019-07-25 DIAGNOSIS — E875 Hyperkalemia: Secondary | ICD-10-CM | POA: Diagnosis not present

## 2019-07-25 DIAGNOSIS — Z9049 Acquired absence of other specified parts of digestive tract: Secondary | ICD-10-CM

## 2019-07-25 DIAGNOSIS — F209 Schizophrenia, unspecified: Secondary | ICD-10-CM | POA: Diagnosis not present

## 2019-07-25 DIAGNOSIS — Z87891 Personal history of nicotine dependence: Secondary | ICD-10-CM | POA: Diagnosis not present

## 2019-07-25 LAB — CBC WITH DIFFERENTIAL/PLATELET
Abs Immature Granulocytes: 0.05 10*3/uL (ref 0.00–0.07)
Absolute Monocytes: 695 cells/uL (ref 200–950)
Basophils Absolute: 88 cells/uL (ref 0–200)
Basophils Absolute: 0.1 10*3/uL (ref 0.0–0.1)
Basophils Relative: 1 %
Basophils Relative: 1 %
Eosinophils Absolute: 554 cells/uL — ABNORMAL HIGH (ref 15–500)
Eosinophils Absolute: 0.5 10*3/uL (ref 0.0–0.5)
Eosinophils Relative: 6.3 %
Eosinophils Relative: 5 %
HCT: 26 % — ABNORMAL LOW (ref 35.0–45.0)
HCT: 25 % — ABNORMAL LOW (ref 36.0–46.0)
Hemoglobin: 8 g/dL — ABNORMAL LOW (ref 11.7–15.5)
Hemoglobin: 7.9 g/dL — ABNORMAL LOW (ref 12.0–15.0)
Immature Granulocytes: 1 %
Lymphocytes Relative: 25 %
Lymphs Abs: 2297 cells/uL (ref 850–3900)
Lymphs Abs: 2.7 10*3/uL (ref 0.7–4.0)
MCH: 24.2 pg — ABNORMAL LOW (ref 27.0–33.0)
MCH: 24.9 pg — ABNORMAL LOW (ref 26.0–34.0)
MCHC: 30.8 g/dL — ABNORMAL LOW (ref 32.0–36.0)
MCHC: 31.6 g/dL (ref 30.0–36.0)
MCV: 78.5 fL — ABNORMAL LOW (ref 80.0–100.0)
MCV: 78.9 fL — ABNORMAL LOW (ref 80.0–100.0)
MPV: 11 fL (ref 7.5–12.5)
Monocytes Absolute: 0.8 10*3/uL (ref 0.1–1.0)
Monocytes Relative: 7.9 %
Monocytes Relative: 8 %
Neutro Abs: 5166 cells/uL (ref 1500–7800)
Neutro Abs: 6.4 10*3/uL (ref 1.7–7.7)
Neutrophils Relative %: 58.7 %
Neutrophils Relative %: 60 %
Platelets: 271 10*3/uL (ref 140–400)
Platelets: 252 10*3/uL (ref 150–400)
RBC: 3.31 10*6/uL — ABNORMAL LOW (ref 3.80–5.10)
RBC: 3.17 MIL/uL — ABNORMAL LOW (ref 3.87–5.11)
RDW: 17.4 % — ABNORMAL HIGH (ref 11.0–15.0)
RDW: 17.8 % — ABNORMAL HIGH (ref 11.5–15.5)
Total Lymphocyte: 26.1 %
WBC: 8.8 10*3/uL (ref 3.8–10.8)
WBC: 10.5 10*3/uL (ref 4.0–10.5)
nRBC: 0 % (ref 0.0–0.2)

## 2019-07-25 LAB — COMPLETE METABOLIC PANEL WITH GFR
AG Ratio: 1.4 (calc) (ref 1.0–2.5)
ALT: 16 U/L (ref 6–29)
AST: 12 U/L (ref 10–35)
Albumin: 3.9 g/dL (ref 3.6–5.1)
Alkaline phosphatase (APISO): 112 U/L (ref 37–153)
BUN/Creatinine Ratio: 23 (calc) — ABNORMAL HIGH (ref 6–22)
BUN: 27 mg/dL — ABNORMAL HIGH (ref 7–25)
CO2: 21 mmol/L (ref 20–32)
Calcium: 9.4 mg/dL (ref 8.6–10.4)
Chloride: 107 mmol/L (ref 98–110)
Creat: 1.18 mg/dL — ABNORMAL HIGH (ref 0.50–1.05)
GFR, Est African American: 60 mL/min/{1.73_m2} (ref >=60)
GFR, Est Non African American: 52 mL/min/{1.73_m2} — ABNORMAL LOW (ref >=60)
Globulin: 2.7 g/dL (calc) (ref 1.9–3.7)
Glucose, Bld: 131 mg/dL — ABNORMAL HIGH (ref 65–99)
Potassium: 5.3 mmol/L (ref 3.5–5.3)
Sodium: 134 mmol/L — ABNORMAL LOW (ref 135–146)
Total Bilirubin: 0.2 mg/dL (ref 0.2–1.2)
Total Protein: 6.6 g/dL (ref 6.1–8.1)

## 2019-07-25 LAB — COMPREHENSIVE METABOLIC PANEL
ALT: 19 U/L (ref 0–44)
AST: 14 U/L — ABNORMAL LOW (ref 15–41)
Albumin: 3.7 g/dL (ref 3.5–5.0)
Alkaline Phosphatase: 112 U/L (ref 38–126)
Anion gap: 8 (ref 5–15)
BUN: 26 mg/dL — ABNORMAL HIGH (ref 6–20)
CO2: 18 mmol/L — ABNORMAL LOW (ref 22–32)
Calcium: 9 mg/dL (ref 8.9–10.3)
Chloride: 106 mmol/L (ref 98–111)
Creatinine, Ser: 1.25 mg/dL — ABNORMAL HIGH (ref 0.44–1.00)
GFR calc Af Amer: 56 mL/min — ABNORMAL LOW (ref >60)
GFR calc non Af Amer: 48 mL/min — ABNORMAL LOW (ref >60)
Glucose, Bld: 129 mg/dL — ABNORMAL HIGH (ref 70–99)
Potassium: 5 mmol/L (ref 3.5–5.1)
Sodium: 132 mmol/L — ABNORMAL LOW (ref 135–145)
Total Bilirubin: 0.3 mg/dL (ref 0.3–1.2)
Total Protein: 7.1 g/dL (ref 6.5–8.1)

## 2019-07-25 NOTE — Progress Notes (Signed)
[facility] POST-OP OFFICE VISIT  07/25/2019  HPI: [name] is a 54 y.o. female 2 months s/p robotic sigmoid colectomy for sigmoid colon cancer.  She has had recent history of diarrhea and dehydration for which she was seen in the hospital.  She had a CT scan done at that time which was nondiagnostic.  Look like there were no immediate issues with intestinal obstruction or colitis.  She denies recent fevers or chills, she reports reasonable appetite denying nausea and vomiting.  Vital signs: BP 127/82   Pulse 76   Temp 97.7 F (36.5 C)   Ht 5' 10.5" (1.791 m)   Wt 270 lb (122.5 kg)   SpO2 98%   BMI 38.19 kg/m    Physical Exam: Constitutional: She appears well. Abdomen: All her incisions appear nicely healed, her abdomen remains soft and nontender without any masses appreciated.  Assessment/Plan: This is a 54 y.o. female 2 months s/p above.   Patient Active Problem List   Diagnosis Date Noted  . Diabetes mellitus with polyneuropathy ([place]) 07/17/2019  . S/P partial colectomy 07/17/2019  . Acute diarrhea 07/17/2019  . Acute kidney injury ([place]) 07/17/2019  . 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 Anemia   . Hyponatremia 05/23/2019  . Neuropathic foot ulcer, left, with fat layer exposed ([place]) 02/05/2019  . Hyperkalemia 02/05/2019  . Morbid obesity ([place]) 05/10/2017  . Long-term use of high-risk medication 05/10/2017  . Hyperlipidemia associated with type 2 diabetes mellitus ([place]) 06/27/2016  . Hammer toe of second toe of right foot 04/20/2016  . Essential hypertension 06/29/2015  . Schizoaffective disorder ([place]) 06/29/2015  . DM (diabetes mellitus), type 2, 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[place]) 16/01/9603  . Polyneuropathy 04/02/2014    - She will continue f/u with hematology due to anemia, etc.  She may f/u with [place] as needed.    Ronny Bacon M.D., FACS 07/25/2019, 1:50 PM

## 2019-07-25 NOTE — Progress Notes (Signed)
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  INTERVAL HISTORY: Patient returns to clinic today as an add-on for repeat laboratory work and further evaluation.  She is admitted to the hospital last week found to have a hemoglobin less than 7.0.  She continues to have chronic weakness and fatigue, but feels improved since discharge.  She has occasional nausea particularly with eating.  She has no neurologic complaints.  She denies any recent fevers or illnesses. She has no chest pain, shortness of breath, cough, or hemoptysis.  She denies any nausea, vomiting, constipation, or diarrhea.  She has no melena or hematochezia.  She has no urinary complaints.  Patient offers no further specific complaints today.    REVIEW OF SYSTEMS:   Review of Systems  Constitutional: Positive for malaise/fatigue. Negative for fever and weight loss.  Respiratory: Negative.  Negative for cough, hemoptysis and shortness of breath.   Cardiovascular: Negative.  Negative for chest pain and leg swelling.  Gastrointestinal: Positive for nausea. Negative for abdominal pain, blood in stool and melena.  Genitourinary: Negative.  Negative for dysuria.  Musculoskeletal: Negative.  Negative for back pain.  Skin: Negative.  Negative for rash.  Neurological: Positive for weakness. Negative for dizziness, sensory change, focal weakness and headaches.    Psychiatric/Behavioral: Negative.  The patient is not nervous/anxious.     As per HPI. Otherwise, a complete review of systems is negative.  PAST MEDICAL HISTORY: Past Medical History:  Diagnosis Date  . Depression   . Diabetes mellitus without complication ([employer])   . Hyperlipidemia   . Hypertension   . Neuromuscular disorder ([place])   . Schizo affective schizophrenia ([place])    abilify and pazil    PAST SURGICAL HISTORY: Past Surgical History:  Procedure Laterality Date  . CHOLECYSTECTOMY    . COLONOSCOPY WITH PROPOFOL N/A 05/26/2019   Procedure: COLONOSCOPY WITH PROPOFOL;  Surgeon: Toledo, Benay Pike, MD;  Location: [facility] ENDOSCOPY;  Service: Gastroenterology;  Laterality: N/A;  . ESOPHAGOGASTRODUODENOSCOPY (EGD) WITH PROPOFOL N/A 05/26/2019   Procedure: ESOPHAGOGASTRODUODENOSCOPY (EGD) WITH PROPOFOL;  Surgeon: Toledo, Benay Pike, MD;  Location: [facility] ENDOSCOPY;  Service: Gastroenterology;  Laterality: N/A;  . IRRIGATION AND DEBRIDEMENT FOOT Right 02/26/2016   Procedure: RIGHT 2ND TOE DEBRIDEMENT;  Surgeon: Samara Deist, DPM;  Location: [facility] ORS;  Service: Podiatry;  Laterality: Right;  . IRRIGATION AND DEBRIDEMENT FOOT Left 12/13/2018   Procedure: IRRIGATION AND DEBRIDEMENT FOOT;  Surgeon: Caroline More, DPM;  Location: [facility] ORS;  Service: Podiatry;  Laterality: Left;    FAMILY HISTORY: Family History  Problem Relation Age of Onset  . Breast cancer Cousin        maternal cousin  . Hypertension Mother   . Hyperthyroidism Mother   . Dementia Mother   . Prostate cancer Father   . Diabetes Maternal Grandmother   . Hypertension Maternal Grandmother   . Cancer Maternal Grandmother        unknown type  . Diabetes Maternal Grandfather   . Hypertension Maternal Grandfather   . Diabetes Paternal  Grandmother   . Hypertension Paternal Grandmother   . Diabetes Paternal Grandfather   . Hypertension Paternal Grandfather     ADVANCED DIRECTIVES (Y/N):  N  HEALTH MAINTENANCE: Social  History   Tobacco Use  . Smoking status: Former Smoker    Packs/day: 3.00    Years: 30.00    Pack years: 90.00    Types: Cigarettes    Quit date: 05/02/2009    Years since quitting: 10.2  . Smokeless tobacco: Never Used  . Tobacco comment: Pt reported  quitting in 2011  Substance Use Topics  . Alcohol use: No  . Drug use: No     Colonoscopy:  PAP:  Bone density:  Lipid panel:  No Known Allergies  Current Outpatient Medications  Medication Sig Dispense Refill  . acetaminophen (TYLENOL) 325 MG tablet Take 2 tablets (650 mg total) by mouth every 6 (six) hours as needed for mild pain.    . ARIPiprazole (ABILIFY) 10 MG tablet Take 10 mg by mouth at bedtime.     [name] ascorbic acid (VITAMIN C) 500 MG tablet Take 500 mg by mouth daily.    [name] aspirin EC 81 MG tablet Take 81 mg by mouth daily.     [name] atorvastatin (LIPITOR) 40 MG tablet Take 1 tablet (40 mg total) by mouth daily. 90 tablet 3  . buPROPion (WELLBUTRIN XL) 150 MG 24 hr tablet Take 150 mg by mouth every morning.     [name] buPROPion (WELLBUTRIN XL) 300 MG 24 hr tablet Take 300 mg by mouth daily.    . colestipol (COLESTID) 1 g tablet Take 2 tablets (2 g total) by mouth 2 (two) times daily. 20 tablet 0  . Ferrous Sulfate 27 MG TABS Take 1 tablet by mouth 2 (two) times daily with a meal.     . furosemide (LASIX) 40 MG tablet Take 1 tablet (40 mg total) by mouth daily. 30 tablet 11  . gabapentin (NEURONTIN) 300 MG capsule Take 1 capsule (300 mg total) by mouth at bedtime.    . hydrochlorothiazide (HYDRODIURIL) 12.5 MG tablet Take 2 tablets (25 mg total) by mouth daily. 90 tablet 1  . insulin aspart (NOVOLOG) 100 UNIT/ML injection Inject 0-15 Units into the skin 3 (three) times daily with meals. 10 mL 11  . Insulin Glargine (LANTUS SOLOSTAR) 100 UNIT/ML Solostar Pen Inject 60 Units into the skin daily before breakfast.    . insulin lispro (HUMALOG) 100 UNIT/ML KwikPen 12 units before meals plus sliding scale - blood sugar 150-200 add 2  units, blood sugar 201-250 add 4 units, blood sugar 251-300 add 6 units, blood sugar 301-350 add 8 units, blood sugar 351-400 add 10 units, max 66 units daily    . Lancets (ONETOUCH ULTRASOFT) lancets Use to check blood sugar up to 3 x daily 100 each 12  . metFORMIN (GLUCOPHAGE) 1000 MG tablet Take 1 tablet (1,000 mg total) by mouth 2 (two) times daily with a meal. 180 tablet 3  . metoprolol succinate (TOPROL-XL) 50 MG 24 hr tablet Take 1 tablet (50 mg total) by mouth daily. Take with or immediately following a meal. 30 tablet 0  . ondansetron (ZOFRAN) 4 MG tablet Take 1 tablet (4 mg total) by mouth every 6 (six) hours. (Patient taking differently: Take 4 mg by mouth every 8 (eight) hours as needed for nausea or vomiting. ) 12 tablet 0  . ONETOUCH VERIO test strip Check blood sugar up to 5 x daily as needed 450 each 3  .  pantoprazole (PROTONIX) 40 MG tablet Take 1 tablet (40 mg total) by mouth 2 (two) times daily before a meal. 60 tablet 2  . traZODone (DESYREL) 50 MG tablet Take 1 tablet (50 mg total) by mouth at bedtime as needed for sleep. 30 tablet 2  . vitamin B-12 1000 MCG tablet Take 1 tablet (1,000 mcg total) by mouth daily.     No current facility-administered medications for this visit.    OBJECTIVE: Vitals:   07/25/19 1346  BP: 112/71  Pulse: 68  Resp: 18  Temp: 98.7 F (37.1 C)  SpO2: 100%     Body mass index is 38.76 kg/m.    ECOG FS:1 - Symptomatic but completely ambulatory  General: Well-developed, well-nourished, no acute distress.  Sitting in a wheelchair. Eyes: Pink conjunctiva, anicteric sclera. HEENT: Normocephalic, moist mucous membranes. Lungs: No audible wheezing or coughing. Heart: Regular rate and rhythm. Abdomen: Soft, nontender, no obvious distention. Musculoskeletal: No edema, cyanosis, or clubbing. Neuro: Alert, answering all questions appropriately. Cranial nerves grossly intact. Skin: No rashes or petechiae noted. Psych: Normal affect.   LAB  RESULTS:  Lab Results  Component Value Date   NA 132 (L) 07/25/2019   K 5.0 07/25/2019   CL 106 07/25/2019   CO2 18 (L) 07/25/2019   GLUCOSE 129 (H) 07/25/2019   BUN 26 (H) 07/25/2019   CREATININE 1.25 (H) 07/25/2019   CALCIUM 9.0 07/25/2019   PROT 7.1 07/25/2019   ALBUMIN 3.7 07/25/2019   AST 14 (L) 07/25/2019   ALT 19 07/25/2019   ALKPHOS 112 07/25/2019   BILITOT 0.3 07/25/2019   GFRNONAA 48 (L) 07/25/2019   GFRAA 56 (L) 07/25/2019    Lab Results  Component Value Date   WBC 10.5 07/25/2019   NEUTROABS 6.4 07/25/2019   HGB 7.9 (L) 07/25/2019   HCT 25.0 (L) 07/25/2019   MCV 78.9 (L) 07/25/2019   PLT 252 07/25/2019     STUDIES: CT ABDOMEN PELVIS WO CONTRAST  Result Date: 07/17/2019 CLINICAL DATA:  Diarrhea, weakness and dizziness, history of diabetes and cholecystectomy EXAM: CT ABDOMEN AND PELVIS WITHOUT CONTRAST TECHNIQUE: Multidetector CT imaging of the abdomen and pelvis was performed following the standard protocol without IV contrast. COMPARISON:  CT abdomen pelvis 05/26/2019 FINDINGS: Lower chest: Lung bases are clear. Normal heart size. No pericardial effusion. Small soft tissue nodule contiguous with the dermis, likely dermal inclusion cyst measuring 1 cm in size (3/12) seen along the left posterior chest wall, unchanged from prior. Hepatobiliary: No focal liver abnormality is seen. Patient is post cholecystectomy. Slight prominence of the biliary tree likely related to reservoir effect. No calcified intraductal gallstones. Pancreas: Unremarkable. No pancreatic ductal dilatation or surrounding inflammatory changes. Spleen: Normal in size without focal abnormality. Adrenals/Urinary Tract: 1.8 cm nodule in the body of the left adrenal gland measures 14 HU, unchanged from prior. No right adrenal nodule. Stable mild bilateral symmetric perinephric stranding, a nonspecific finding though may correlate with either age or decreased renal function. No visible or contour deforming  renal lesions. No urolithiasis or hydronephrosis. Urinary bladder is largely decompressed at the time of exam and therefore poorly evaluated by CT imaging. Stomach/Bowel: Distal esophagus, stomach and duodenal sweep are unremarkable. No small bowel wall thickening or dilatation. No evidence of obstruction. The appendix is not well visualized. No pericecal inflammation to suggest an occult appendicitis. Much of the colon is fluid-filled with a lack of formed stool. Patent colocolonic anastomosis in the level of the sigmoid. Vascular/Lymphatic: Atherosclerotic plaque within the normal  caliber aorta. Some focal stranding appears centered upon the proximal segment of the IMA (3/57). Vascular evaluation is limited in the absence of contrast media though feasibly may be postoperative given history of recent colonic resection. No other acute vascular abnormality is seen. Few reactive lymph nodes in the adjacent retroperitoneum. No pathologically enlarged abdominopelvic nodes. Reproductive: Anteverted uterus. Stable appearance of a 4 x 3.6 x 3.1 cm dermoid in the right adnexa. No new or concerning adnexal lesions. Other: No free air or free fluid. No bowel containing hernia. Linear soft tissue stranding in the right lower quadrant extending to the abdominal wall has an appearance most compatible with port placement. Correlate for history of laparoscopic intervention. Musculoskeletal: No acute osseous abnormality or suspicious osseous lesion. IMPRESSION: 1. Expected postsurgical changes following sigmoid resection. No evidence of obstruction at the anastomotic site in the pelvis. 2. Much of the colon is fluid-filled with a lack of formed stool. This finding can be seen with a diarrheal illness. 3. Some focal stranding appears centered upon the proximal segment of the IMA. Vascular evaluation is limited in the absence of contrast media. This may be a postoperative change given recent sigmoid resection however if there is  clinical concern for a vascular issue, consider CT angiography of the abdomen and pelvis. 4. Stable appearance of a 4 cm right adnexal dermoid. 5. Stable 1.8 cm left adrenal nodule. 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 6. Stable mild bilateral perinephric stranding, a nonspecific finding though may correlate with either age or decreased renal function. 7. Aortic Atherosclerosis (ICD10-I70.0). Electronically Signed   By: Lovena Le M.D.   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  PLAN:    1. 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 Final pathology and imaging reviewed independently.  Patient had a T2, N0, M0 lesion which does not require adjuvant chemotherapy.  No further interventions are needed.    CEA is within normal limits.  Continue follow-up with surgery as indicated.  Patient will require repeat colonoscopy in 6 to 12 months from patient surgery.  Return to clinic as previously scheduled in May 2021. 2.  Iron deficiency anemia: Patient's hemoglobin is decreased at 7.9, but stable since discharge from hospital.  No intervention is needed at this time.  Return to clinic as above in 6 weeks for further evaluation.  Will consider IV iron at that time if no improvement of her hemoglobin. 3.  Acute on chronic renal insufficiency: Patient's creatinine remains mildly elevated at 1.25, but improved since discharge. 4.  Hyperkalemia: Potassium levels are trending down.  Monitor. 5.  Covid positivity: Likely residual positive test from Covid infection 6 to 8 weeks ago.   Patient expressed understanding and was in agreement with this plan. She also understands that She can call clinic at any time with any questions, concerns, or complaints.   Cancer Staging Adenocarcinoma of sigmoid colon [facility]) Staging form: Colon and Rectum, AJCC 8th Edition - Clinical stage from 06/07/2019:  Stage I (cT2, cN0, cM0) - Signed by Lloyd Huger, MD on 06/07/2019   Lloyd Huger, MD   07/25/2019 3:19 PM

## 2019-07-25 NOTE — Patient Instructions (Signed)
   Follow-up with our office as needed.  Please call and ask to speak with a nurse if you develop questions or concerns.  

## 2019-07-26 ENCOUNTER — Inpatient Hospital Stay: Payer: Medicare Other | Admitting: Oncology

## 2019-07-26 ENCOUNTER — Inpatient Hospital Stay: Payer: Medicare Other

## 2019-07-26 DIAGNOSIS — I10 Essential (primary) hypertension: Secondary | ICD-10-CM | POA: Diagnosis not present

## 2019-07-26 DIAGNOSIS — E1142 Type 2 diabetes mellitus with diabetic polyneuropathy: Secondary | ICD-10-CM | POA: Diagnosis not present

## 2019-07-26 DIAGNOSIS — M2041 Other hammer toe(s) (acquired), right foot: Secondary | ICD-10-CM | POA: Diagnosis not present

## 2019-07-26 DIAGNOSIS — C187 Malignant neoplasm of sigmoid colon: Secondary | ICD-10-CM | POA: Diagnosis not present

## 2019-07-26 DIAGNOSIS — E7849 Other hyperlipidemia: Secondary | ICD-10-CM | POA: Diagnosis not present

## 2019-07-26 DIAGNOSIS — E1165 Type 2 diabetes mellitus with hyperglycemia: Secondary | ICD-10-CM | POA: Diagnosis not present

## 2019-07-26 DIAGNOSIS — Z8616 Personal history of COVID-19: Secondary | ICD-10-CM | POA: Diagnosis not present

## 2019-07-26 DIAGNOSIS — Z9049 Acquired absence of other specified parts of digestive tract: Secondary | ICD-10-CM | POA: Diagnosis not present

## 2019-07-26 DIAGNOSIS — Z794 Long term (current) use of insulin: Secondary | ICD-10-CM | POA: Diagnosis not present

## 2019-07-26 DIAGNOSIS — K219 Gastro-esophageal reflux disease without esophagitis: Secondary | ICD-10-CM | POA: Diagnosis not present

## 2019-07-26 DIAGNOSIS — Z483 Aftercare following surgery for neoplasm: Secondary | ICD-10-CM | POA: Diagnosis not present

## 2019-07-26 DIAGNOSIS — Z8631 Personal history of diabetic foot ulcer: Secondary | ICD-10-CM | POA: Diagnosis not present

## 2019-07-26 DIAGNOSIS — D62 Acute posthemorrhagic anemia: Secondary | ICD-10-CM | POA: Diagnosis not present

## 2019-07-26 DIAGNOSIS — Z87891 Personal history of nicotine dependence: Secondary | ICD-10-CM | POA: Diagnosis not present

## 2019-07-26 DIAGNOSIS — E11610 Type 2 diabetes mellitus with diabetic neuropathic arthropathy: Secondary | ICD-10-CM | POA: Diagnosis not present

## 2019-07-26 DIAGNOSIS — M6281 Muscle weakness (generalized): Secondary | ICD-10-CM | POA: Diagnosis not present

## 2019-07-26 LAB — CEA: CEA: 1.4 ng/mL (ref 0.0–4.7)

## 2019-07-29 DIAGNOSIS — K219 Gastro-esophageal reflux disease without esophagitis: Secondary | ICD-10-CM | POA: Diagnosis not present

## 2019-07-29 DIAGNOSIS — D62 Acute posthemorrhagic anemia: Secondary | ICD-10-CM | POA: Diagnosis not present

## 2019-07-29 DIAGNOSIS — E11610 Type 2 diabetes mellitus with diabetic neuropathic arthropathy: Secondary | ICD-10-CM | POA: Diagnosis not present

## 2019-07-29 DIAGNOSIS — E1165 Type 2 diabetes mellitus with hyperglycemia: Secondary | ICD-10-CM | POA: Diagnosis not present

## 2019-07-29 DIAGNOSIS — Z87891 Personal history of nicotine dependence: Secondary | ICD-10-CM | POA: Diagnosis not present

## 2019-07-29 DIAGNOSIS — E1142 Type 2 diabetes mellitus with diabetic polyneuropathy: Secondary | ICD-10-CM | POA: Diagnosis not present

## 2019-07-29 DIAGNOSIS — M2041 Other hammer toe(s) (acquired), right foot: Secondary | ICD-10-CM | POA: Diagnosis not present

## 2019-07-29 DIAGNOSIS — Z8631 Personal history of diabetic foot ulcer: Secondary | ICD-10-CM | POA: Diagnosis not present

## 2019-07-29 DIAGNOSIS — I10 Essential (primary) hypertension: Secondary | ICD-10-CM | POA: Diagnosis not present

## 2019-07-29 DIAGNOSIS — E7849 Other hyperlipidemia: Secondary | ICD-10-CM | POA: Diagnosis not present

## 2019-07-29 DIAGNOSIS — Z483 Aftercare following surgery for neoplasm: Secondary | ICD-10-CM | POA: Diagnosis not present

## 2019-07-29 DIAGNOSIS — C187 Malignant neoplasm of sigmoid colon: Secondary | ICD-10-CM | POA: Diagnosis not present

## 2019-07-29 DIAGNOSIS — Z794 Long term (current) use of insulin: Secondary | ICD-10-CM | POA: Diagnosis not present

## 2019-07-29 DIAGNOSIS — Z9049 Acquired absence of other specified parts of digestive tract: Secondary | ICD-10-CM | POA: Diagnosis not present

## 2019-07-29 DIAGNOSIS — Z8616 Personal history of COVID-19: Secondary | ICD-10-CM | POA: Diagnosis not present

## 2019-07-29 DIAGNOSIS — M6281 Muscle weakness (generalized): Secondary | ICD-10-CM | POA: Diagnosis not present

## 2019-07-30 DIAGNOSIS — Z8631 Personal history of diabetic foot ulcer: Secondary | ICD-10-CM | POA: Diagnosis not present

## 2019-07-30 DIAGNOSIS — M6281 Muscle weakness (generalized): Secondary | ICD-10-CM | POA: Diagnosis not present

## 2019-07-30 DIAGNOSIS — M2041 Other hammer toe(s) (acquired), right foot: Secondary | ICD-10-CM | POA: Diagnosis not present

## 2019-07-30 DIAGNOSIS — I10 Essential (primary) hypertension: Secondary | ICD-10-CM | POA: Diagnosis not present

## 2019-07-30 DIAGNOSIS — C187 Malignant neoplasm of sigmoid colon: Secondary | ICD-10-CM | POA: Diagnosis not present

## 2019-07-30 DIAGNOSIS — Z8616 Personal history of COVID-19: Secondary | ICD-10-CM | POA: Diagnosis not present

## 2019-07-30 DIAGNOSIS — E7849 Other hyperlipidemia: Secondary | ICD-10-CM | POA: Diagnosis not present

## 2019-07-30 DIAGNOSIS — K219 Gastro-esophageal reflux disease without esophagitis: Secondary | ICD-10-CM | POA: Diagnosis not present

## 2019-07-30 DIAGNOSIS — E1142 Type 2 diabetes mellitus with diabetic polyneuropathy: Secondary | ICD-10-CM | POA: Diagnosis not present

## 2019-07-30 DIAGNOSIS — Z87891 Personal history of nicotine dependence: Secondary | ICD-10-CM | POA: Diagnosis not present

## 2019-07-30 DIAGNOSIS — D62 Acute posthemorrhagic anemia: Secondary | ICD-10-CM | POA: Diagnosis not present

## 2019-07-30 DIAGNOSIS — Z9049 Acquired absence of other specified parts of digestive tract: Secondary | ICD-10-CM | POA: Diagnosis not present

## 2019-07-30 DIAGNOSIS — E1165 Type 2 diabetes mellitus with hyperglycemia: Secondary | ICD-10-CM | POA: Diagnosis not present

## 2019-07-30 DIAGNOSIS — Z794 Long term (current) use of insulin: Secondary | ICD-10-CM | POA: Diagnosis not present

## 2019-07-30 DIAGNOSIS — E11610 Type 2 diabetes mellitus with diabetic neuropathic arthropathy: Secondary | ICD-10-CM | POA: Diagnosis not present

## 2019-07-30 DIAGNOSIS — Z483 Aftercare following surgery for neoplasm: Secondary | ICD-10-CM | POA: Diagnosis not present

## 2019-07-31 ENCOUNTER — Ambulatory Visit: Payer: Self-pay | Admitting: Pharmacist

## 2019-07-31 ENCOUNTER — Other Ambulatory Visit: Payer: Self-pay | Admitting: Family Medicine

## 2019-07-31 DIAGNOSIS — IMO0002 Reserved for concepts with insufficient information to code with codable children: Secondary | ICD-10-CM

## 2019-07-31 DIAGNOSIS — Z9049 Acquired absence of other specified parts of digestive tract: Secondary | ICD-10-CM

## 2019-07-31 DIAGNOSIS — E1149 Type 2 diabetes mellitus with other diabetic neurological complication: Secondary | ICD-10-CM

## 2019-07-31 DIAGNOSIS — I10 Essential (primary) hypertension: Secondary | ICD-10-CM

## 2019-07-31 DIAGNOSIS — K909 Intestinal malabsorption, unspecified: Secondary | ICD-10-CM

## 2019-07-31 MED ORDER — COLESTIPOL HCL 1 G PO TABS: 2.0000 g | ORAL_TABLET | Freq: Two times a day (BID) | ORAL | 0 refills | Status: DC

## 2019-07-31 NOTE — Patient Instructions (Signed)
Thank you allowing the Chronic Care Management Team to be a part of your care! It was a pleasure speaking with you today!     CCM (Chronic Care Management) Team    Noreene Larsson RN, MSN, CCM Nurse Care Coordinator  [phone]   Harlow Asa PharmD  Clinical Pharmacist  [phone]   [name] LCSW Clinical Social Worker [phone]  Visit Information  Goals Addressed            This Visit's Progress   . PharmD - Medication Management       Current Barriers:  . Financial Barriers . Non adherence to prescribed medication regimen . Lack of BP results for clinical team  Pharmacist Clinical Goal(s):  [name] Over the next 30 days, patient will work with [name] to address needs related to optimization of medication regimen and improvement of medication adherence  Interventions: . Perform chart review . Follow up with patient's daughter/caregiver regarding patient's blood pressure control o Confirms patient STOPPED lisinopril as directed at hospital discharge o Reports patient taking: - Furosemide 40 mg once daily - HCTZ 12.5 mg - 2 tablets (25 mg total) once daily - Metoprolol 50 mg ER once daily o Reports HH has been monitoring BP 3x/week and results have been "good" . Follow up with daughter regarding iron supplementation o Reports patient continues to take ferrous sulfate 27 mg twice daily o Daughter expresses concern about current hemoglobin level. States planning to call to f/u with [name] to discuss further . Follow up regarding patient's CBG monitoring and control o Reports patient taking: - Metformin 1000 mg twice daily - Lantus 20 units daily  - Humalog according to sliding scale as provided by Endocrinology o Note Lantus and Humalog sliding scale adjusted during 3/24 visit o Daughter reports CBGs elevated since this change and she is planning to f/u with Endocrinology office with recent CBG results today . Caregiver reports patient completed  Rx of colestipol as prescribed at hospital discharge on 3/19 o Note 5-day course supplied o Reports diarrhea has significantly increased since ran out of colestipol o Note appointment with GI scheduled for 4/21 . Collaborate with PCP regarding patient's increased diarrhea and colestipol Rx o Provider sends new Rx for 30 day supply to pharmacy . Follow up with caregiver to advise that PCP has sent new Rx for 30-day supply to pharmacy to allow patient to continue until upcoming GI appointment. . Counsel on colestipol administration: Caregiver reports patient was taking colestipol without regard to timing of other medications.  o Counsel on importance taking all other medications at least 1 hour before or 4 hours after colestipol. Daughter verbalizes understanding and discuss how to spread this out with current regimen  Patient Self Care Activities:  . Self administers medications as prescribed o Using weekly pillbox and calendar as adherence tools . Attends all scheduled provider appointments . Calls pharmacy for medication refills . Calls provider office for new concerns or questions . Patient to check blood sugar as directed by Endocrinologist and keep log  Please see past updates related to this goal by clicking on the "Past Updates" button in the selected goal         Patient verbalizes understanding of instructions provided today.   The care management team will reach out to the patient again over the next 30 days.   Harlow Asa, PharmD, [place] [employer] [phone]

## 2019-07-31 NOTE — Chronic Care Management (AMB) (Signed)
Chronic Care Management   Follow Up Note   07/31/2019 Name: [name] MRN: [id number] DOB: 1965/09/05  Referred by: [name], DO Reason for referral : No chief complaint on file.   [name] is a 54 y.o. year old female who is a primary care patient of [name], DO. The CCM team was consulted for assistance with chronic disease management and care coordination needs.  [name] has a past medical history including but not limited to type 2 diabetes, adenocarcinoma of sigmoid colon, hypertension and hyperlipidemia.  I reached out to [name] and [name] by phone today.   Review of patient status, including review of consultants reports, relevant laboratory and other test results, and collaboration with appropriate care team members and the patient's provider was performed as part of comprehensive patient evaluation and provision of chronic care management services.     Outpatient Encounter Medications as of 07/31/2019  Medication Sig Note  . buPROPion (WELLBUTRIN XL) 300 MG 24 hr tablet Take 300 mg by mouth daily.   . Ferrous Sulfate 27 MG TABS Take 1 tablet by mouth 2 (two) times daily with a meal.    . furosemide (LASIX) 40 MG tablet Take 1 tablet (40 mg total) by mouth daily.   . hydrochlorothiazide (HYDRODIURIL) 12.5 MG tablet Take 2 tablets (25 mg total) by mouth daily.   . Insulin Glargine (LANTUS SOLOSTAR) 100 UNIT/ML Solostar Pen Inject 60 Units into the skin daily before breakfast. (Patient taking differently: Inject 20 Units into the skin daily before breakfast. )   . insulin lispro (HUMALOG) 100 UNIT/ML KwikPen 12 units before meals plus sliding scale - blood sugar 150-200 add 2 units, blood sugar 201-250 add 4 units, blood sugar 251-300 add 6 units, blood sugar 301-350 add 8 units, blood sugar 351-400 add 10 units, max 66 units daily   . metFORMIN (GLUCOPHAGE) 1000 MG tablet Take 1 tablet (1,000 mg  total) by mouth 2 (two) times daily with a meal.   . metoprolol succinate (TOPROL-XL) 50 MG 24 hr tablet Take 1 tablet (50 mg total) by mouth daily. Take with or immediately following a meal.   . pantoprazole (PROTONIX) 40 MG tablet Take 1 tablet (40 mg total) by mouth 2 (two) times daily before a meal.   . [DISCONTINUED] insulin aspart (NOVOLOG) 100 UNIT/ML injection Inject 0-15 Units into the skin 3 (three) times daily with meals.   [name] acetaminophen (TYLENOL) 325 MG tablet Take 2 tablets (650 mg total) by mouth every 6 (six) hours as needed for mild pain.   . ARIPiprazole (ABILIFY) 10 MG tablet Take 10 mg by mouth at bedtime.    [name] ascorbic acid (VITAMIN C) 500 MG tablet Take 500 mg by mouth daily.   [name] aspirin EC 81 MG tablet Take 81 mg by mouth daily.    [name] atorvastatin (LIPITOR) 40 MG tablet Take 1 tablet (40 mg total) by mouth daily.   [name] gabapentin (NEURONTIN) 300 MG capsule Take 1 capsule (300 mg total) by mouth at bedtime.   . Lancets (ONETOUCH ULTRASOFT) lancets Use to check blood sugar up to 3 x daily   . ondansetron (ZOFRAN) 4 MG tablet Take 1 tablet (4 mg total) by mouth every 6 (six) hours. (Patient taking differently: Take 4 mg by mouth every 8 (eight) hours as needed for nausea or vomiting. )   . ONETOUCH VERIO test strip Check blood sugar up to 5 x daily as needed   .  traZODone (DESYREL) 50 MG tablet Take 1 tablet (50 mg total) by mouth at bedtime as needed for sleep. 07/17/2019: Have not started   . vitamin B-12 1000 MCG tablet Take 1 tablet (1,000 mcg total) by mouth daily.   . [DISCONTINUED] buPROPion (WELLBUTRIN XL) 150 MG 24 hr tablet Take 150 mg by mouth every morning.    . [DISCONTINUED] colestipol (COLESTID) 1 g tablet Take 2 tablets (2 g total) by mouth 2 (two) times daily. (Patient not taking: Reported on 07/31/2019)    No facility-administered encounter medications on file as of 07/31/2019.    Goals Addressed            This Visit's Progress   . PharmD - Medication  Management       Current Barriers:  . Financial Barriers . Non adherence to prescribed medication regimen . Lack of BP results for clinical team  Pharmacist Clinical Goal(s):  [name] Over the next 30 days, patient will work with [name] to address needs related to optimization of medication regimen and improvement of medication adherence  Interventions: . Perform chart review . Follow up with patient's daughter/caregiver regarding patient's blood pressure control o Confirms patient STOPPED lisinopril as directed at hospital discharge o Reports patient taking: - Furosemide 40 mg once daily - HCTZ 12.5 mg - 2 tablets (25 mg total) once daily - Metoprolol 50 mg ER once daily o Reports HH has been monitoring BP 3x/week and results have been "good" . Follow up with daughter regarding iron supplementation o Reports patient continues to take ferrous sulfate 27 mg twice daily o Note per chart review, [name] wrote on 3/25 "patient's hemoglobin is decreased at 7.9, but stable since discharge from hospital.  No intervention is needed at this time.  Return to clinic as above in 6 weeks for further evaluation.  Will consider IV iron at that time if no improvement of her hemoglobin." o Daughter expresses concern about current hemoglobin level. States planning to call to f/u with [name] to discuss further . Follow up regarding patient's CBG monitoring and control o Reports patient taking: - Metformin 1000 mg twice daily - Lantus 20 units daily  - Humalog according to sliding scale as provided by Endocrinology o Note Lantus and Humalog sliding scale adjusted during 3/24 visit o Daughter reports CBGs elevated since this change and she is planning to f/u with Endocrinology office with recent CBG results today . Caregiver reports patient completed Rx of colestipol as prescribed at hospital discharge on 3/19 o Note 5-day course supplied o Reports diarrhea has significantly increased since ran  out of colestipol o Note appointment with GI scheduled for 4/21 . Collaborate with PCP regarding patient's increased diarrhea and colestipol Rx o Provider sends new Rx for 30 day supply to pharmacy . Follow up with caregiver to advise that PCP has sent new Rx for 30-day supply to pharmacy to allow patient to continue until upcoming GI appointment. . Counsel on colestipol administration: Caregiver reports patient was taking colestipol without regard to timing of other medications.  o Counsel on importance taking all other medications at least 1 hour before or 4 hours after colestipol. Daughter verbalizes understanding and discuss how to spread this out with current regimen  Patient Self Care Activities:  . Self administers medications as prescribed o Using weekly pillbox and calendar as adherence tools . Attends all scheduled provider appointments . Calls pharmacy for medication refills . Calls provider office for new concerns or questions . Patient  to check blood sugar as directed by Endocrinologist and keep log  Please see past updates related to this goal by clicking on the "Past Updates" button in the selected goal         Plan  The care management team will reach out to the patient again over the next 30 days.   Harlow Asa, PharmD, [place] [employer] [phone]

## 2019-08-01 DIAGNOSIS — Z794 Long term (current) use of insulin: Secondary | ICD-10-CM | POA: Diagnosis not present

## 2019-08-01 DIAGNOSIS — C187 Malignant neoplasm of sigmoid colon: Secondary | ICD-10-CM | POA: Diagnosis not present

## 2019-08-01 DIAGNOSIS — K219 Gastro-esophageal reflux disease without esophagitis: Secondary | ICD-10-CM | POA: Diagnosis not present

## 2019-08-01 DIAGNOSIS — I10 Essential (primary) hypertension: Secondary | ICD-10-CM | POA: Diagnosis not present

## 2019-08-01 DIAGNOSIS — M2041 Other hammer toe(s) (acquired), right foot: Secondary | ICD-10-CM | POA: Diagnosis not present

## 2019-08-01 DIAGNOSIS — E7849 Other hyperlipidemia: Secondary | ICD-10-CM | POA: Diagnosis not present

## 2019-08-01 DIAGNOSIS — Z8616 Personal history of COVID-19: Secondary | ICD-10-CM | POA: Diagnosis not present

## 2019-08-01 DIAGNOSIS — D62 Acute posthemorrhagic anemia: Secondary | ICD-10-CM | POA: Diagnosis not present

## 2019-08-01 DIAGNOSIS — M6281 Muscle weakness (generalized): Secondary | ICD-10-CM | POA: Diagnosis not present

## 2019-08-01 DIAGNOSIS — E11621 Type 2 diabetes mellitus with foot ulcer: Secondary | ICD-10-CM | POA: Diagnosis not present

## 2019-08-01 DIAGNOSIS — Z8631 Personal history of diabetic foot ulcer: Secondary | ICD-10-CM | POA: Diagnosis not present

## 2019-08-01 DIAGNOSIS — Z483 Aftercare following surgery for neoplasm: Secondary | ICD-10-CM | POA: Diagnosis not present

## 2019-08-01 DIAGNOSIS — Z87891 Personal history of nicotine dependence: Secondary | ICD-10-CM | POA: Diagnosis not present

## 2019-08-01 DIAGNOSIS — Z9049 Acquired absence of other specified parts of digestive tract: Secondary | ICD-10-CM | POA: Diagnosis not present

## 2019-08-01 DIAGNOSIS — E1165 Type 2 diabetes mellitus with hyperglycemia: Secondary | ICD-10-CM | POA: Diagnosis not present

## 2019-08-01 DIAGNOSIS — E1142 Type 2 diabetes mellitus with diabetic polyneuropathy: Secondary | ICD-10-CM | POA: Diagnosis not present

## 2019-08-01 DIAGNOSIS — E11610 Type 2 diabetes mellitus with diabetic neuropathic arthropathy: Secondary | ICD-10-CM | POA: Diagnosis not present

## 2019-08-02 DIAGNOSIS — E1165 Type 2 diabetes mellitus with hyperglycemia: Secondary | ICD-10-CM | POA: Diagnosis not present

## 2019-08-02 DIAGNOSIS — E11610 Type 2 diabetes mellitus with diabetic neuropathic arthropathy: Secondary | ICD-10-CM | POA: Diagnosis not present

## 2019-08-02 DIAGNOSIS — E7849 Other hyperlipidemia: Secondary | ICD-10-CM | POA: Diagnosis not present

## 2019-08-02 DIAGNOSIS — Z8616 Personal history of COVID-19: Secondary | ICD-10-CM | POA: Diagnosis not present

## 2019-08-02 DIAGNOSIS — C187 Malignant neoplasm of sigmoid colon: Secondary | ICD-10-CM | POA: Diagnosis not present

## 2019-08-02 DIAGNOSIS — E1142 Type 2 diabetes mellitus with diabetic polyneuropathy: Secondary | ICD-10-CM | POA: Diagnosis not present

## 2019-08-02 DIAGNOSIS — K219 Gastro-esophageal reflux disease without esophagitis: Secondary | ICD-10-CM | POA: Diagnosis not present

## 2019-08-02 DIAGNOSIS — M2041 Other hammer toe(s) (acquired), right foot: Secondary | ICD-10-CM | POA: Diagnosis not present

## 2019-08-02 DIAGNOSIS — Z8631 Personal history of diabetic foot ulcer: Secondary | ICD-10-CM | POA: Diagnosis not present

## 2019-08-02 DIAGNOSIS — Z483 Aftercare following surgery for neoplasm: Secondary | ICD-10-CM | POA: Diagnosis not present

## 2019-08-02 DIAGNOSIS — Z87891 Personal history of nicotine dependence: Secondary | ICD-10-CM | POA: Diagnosis not present

## 2019-08-02 DIAGNOSIS — Z9049 Acquired absence of other specified parts of digestive tract: Secondary | ICD-10-CM | POA: Diagnosis not present

## 2019-08-02 DIAGNOSIS — D62 Acute posthemorrhagic anemia: Secondary | ICD-10-CM | POA: Diagnosis not present

## 2019-08-02 DIAGNOSIS — Z794 Long term (current) use of insulin: Secondary | ICD-10-CM | POA: Diagnosis not present

## 2019-08-02 DIAGNOSIS — M6281 Muscle weakness (generalized): Secondary | ICD-10-CM | POA: Diagnosis not present

## 2019-08-02 DIAGNOSIS — I10 Essential (primary) hypertension: Secondary | ICD-10-CM | POA: Diagnosis not present

## 2019-08-03 DIAGNOSIS — M14672 Charcot's joint, left ankle and foot: Secondary | ICD-10-CM | POA: Diagnosis not present

## 2019-08-05 DIAGNOSIS — Z87891 Personal history of nicotine dependence: Secondary | ICD-10-CM | POA: Diagnosis not present

## 2019-08-05 DIAGNOSIS — D62 Acute posthemorrhagic anemia: Secondary | ICD-10-CM | POA: Diagnosis not present

## 2019-08-05 DIAGNOSIS — E11610 Type 2 diabetes mellitus with diabetic neuropathic arthropathy: Secondary | ICD-10-CM | POA: Diagnosis not present

## 2019-08-05 DIAGNOSIS — E1142 Type 2 diabetes mellitus with diabetic polyneuropathy: Secondary | ICD-10-CM | POA: Diagnosis not present

## 2019-08-05 DIAGNOSIS — K219 Gastro-esophageal reflux disease without esophagitis: Secondary | ICD-10-CM | POA: Diagnosis not present

## 2019-08-05 DIAGNOSIS — M2041 Other hammer toe(s) (acquired), right foot: Secondary | ICD-10-CM | POA: Diagnosis not present

## 2019-08-05 DIAGNOSIS — Z9049 Acquired absence of other specified parts of digestive tract: Secondary | ICD-10-CM | POA: Diagnosis not present

## 2019-08-05 DIAGNOSIS — Z8631 Personal history of diabetic foot ulcer: Secondary | ICD-10-CM | POA: Diagnosis not present

## 2019-08-05 DIAGNOSIS — Z8616 Personal history of COVID-19: Secondary | ICD-10-CM | POA: Diagnosis not present

## 2019-08-05 DIAGNOSIS — E7849 Other hyperlipidemia: Secondary | ICD-10-CM | POA: Diagnosis not present

## 2019-08-05 DIAGNOSIS — E1165 Type 2 diabetes mellitus with hyperglycemia: Secondary | ICD-10-CM | POA: Diagnosis not present

## 2019-08-05 DIAGNOSIS — Z483 Aftercare following surgery for neoplasm: Secondary | ICD-10-CM | POA: Diagnosis not present

## 2019-08-05 DIAGNOSIS — Z794 Long term (current) use of insulin: Secondary | ICD-10-CM | POA: Diagnosis not present

## 2019-08-05 DIAGNOSIS — C187 Malignant neoplasm of sigmoid colon: Secondary | ICD-10-CM | POA: Diagnosis not present

## 2019-08-05 DIAGNOSIS — I10 Essential (primary) hypertension: Secondary | ICD-10-CM | POA: Diagnosis not present

## 2019-08-05 DIAGNOSIS — M6281 Muscle weakness (generalized): Secondary | ICD-10-CM | POA: Diagnosis not present

## 2019-08-06 DIAGNOSIS — M2041 Other hammer toe(s) (acquired), right foot: Secondary | ICD-10-CM | POA: Diagnosis not present

## 2019-08-06 DIAGNOSIS — Z9049 Acquired absence of other specified parts of digestive tract: Secondary | ICD-10-CM | POA: Diagnosis not present

## 2019-08-06 DIAGNOSIS — Z87891 Personal history of nicotine dependence: Secondary | ICD-10-CM | POA: Diagnosis not present

## 2019-08-06 DIAGNOSIS — E7849 Other hyperlipidemia: Secondary | ICD-10-CM | POA: Diagnosis not present

## 2019-08-06 DIAGNOSIS — M6281 Muscle weakness (generalized): Secondary | ICD-10-CM | POA: Diagnosis not present

## 2019-08-06 DIAGNOSIS — E11610 Type 2 diabetes mellitus with diabetic neuropathic arthropathy: Secondary | ICD-10-CM | POA: Diagnosis not present

## 2019-08-06 DIAGNOSIS — Z8616 Personal history of COVID-19: Secondary | ICD-10-CM | POA: Diagnosis not present

## 2019-08-06 DIAGNOSIS — I10 Essential (primary) hypertension: Secondary | ICD-10-CM | POA: Diagnosis not present

## 2019-08-06 DIAGNOSIS — K219 Gastro-esophageal reflux disease without esophagitis: Secondary | ICD-10-CM | POA: Diagnosis not present

## 2019-08-06 DIAGNOSIS — Z483 Aftercare following surgery for neoplasm: Secondary | ICD-10-CM | POA: Diagnosis not present

## 2019-08-06 DIAGNOSIS — Z8631 Personal history of diabetic foot ulcer: Secondary | ICD-10-CM | POA: Diagnosis not present

## 2019-08-06 DIAGNOSIS — D62 Acute posthemorrhagic anemia: Secondary | ICD-10-CM | POA: Diagnosis not present

## 2019-08-06 DIAGNOSIS — C187 Malignant neoplasm of sigmoid colon: Secondary | ICD-10-CM | POA: Diagnosis not present

## 2019-08-06 DIAGNOSIS — E1142 Type 2 diabetes mellitus with diabetic polyneuropathy: Secondary | ICD-10-CM | POA: Diagnosis not present

## 2019-08-06 DIAGNOSIS — Z794 Long term (current) use of insulin: Secondary | ICD-10-CM | POA: Diagnosis not present

## 2019-08-06 DIAGNOSIS — E1165 Type 2 diabetes mellitus with hyperglycemia: Secondary | ICD-10-CM | POA: Diagnosis not present

## 2019-08-09 DIAGNOSIS — Z8631 Personal history of diabetic foot ulcer: Secondary | ICD-10-CM | POA: Diagnosis not present

## 2019-08-09 DIAGNOSIS — C187 Malignant neoplasm of sigmoid colon: Secondary | ICD-10-CM | POA: Diagnosis not present

## 2019-08-09 DIAGNOSIS — E1142 Type 2 diabetes mellitus with diabetic polyneuropathy: Secondary | ICD-10-CM | POA: Diagnosis not present

## 2019-08-09 DIAGNOSIS — K219 Gastro-esophageal reflux disease without esophagitis: Secondary | ICD-10-CM | POA: Diagnosis not present

## 2019-08-09 DIAGNOSIS — E1165 Type 2 diabetes mellitus with hyperglycemia: Secondary | ICD-10-CM | POA: Diagnosis not present

## 2019-08-09 DIAGNOSIS — E11610 Type 2 diabetes mellitus with diabetic neuropathic arthropathy: Secondary | ICD-10-CM | POA: Diagnosis not present

## 2019-08-09 DIAGNOSIS — M2041 Other hammer toe(s) (acquired), right foot: Secondary | ICD-10-CM | POA: Diagnosis not present

## 2019-08-09 DIAGNOSIS — Z483 Aftercare following surgery for neoplasm: Secondary | ICD-10-CM | POA: Diagnosis not present

## 2019-08-09 DIAGNOSIS — E7849 Other hyperlipidemia: Secondary | ICD-10-CM | POA: Diagnosis not present

## 2019-08-09 DIAGNOSIS — M6281 Muscle weakness (generalized): Secondary | ICD-10-CM | POA: Diagnosis not present

## 2019-08-09 DIAGNOSIS — Z9049 Acquired absence of other specified parts of digestive tract: Secondary | ICD-10-CM | POA: Diagnosis not present

## 2019-08-09 DIAGNOSIS — Z87891 Personal history of nicotine dependence: Secondary | ICD-10-CM | POA: Diagnosis not present

## 2019-08-09 DIAGNOSIS — D62 Acute posthemorrhagic anemia: Secondary | ICD-10-CM | POA: Diagnosis not present

## 2019-08-09 DIAGNOSIS — Z794 Long term (current) use of insulin: Secondary | ICD-10-CM | POA: Diagnosis not present

## 2019-08-09 DIAGNOSIS — I10 Essential (primary) hypertension: Secondary | ICD-10-CM | POA: Diagnosis not present

## 2019-08-09 DIAGNOSIS — Z8616 Personal history of covid-19: Secondary | ICD-10-CM | POA: Diagnosis not present

## 2019-08-12 ENCOUNTER — Ambulatory Visit: Payer: Self-pay | Admitting: General Practice

## 2019-08-12 DIAGNOSIS — Z483 Aftercare following surgery for neoplasm: Secondary | ICD-10-CM | POA: Diagnosis not present

## 2019-08-12 DIAGNOSIS — Z8616 Personal history of COVID-19: Secondary | ICD-10-CM | POA: Diagnosis not present

## 2019-08-12 DIAGNOSIS — E1165 Type 2 diabetes mellitus with hyperglycemia: Secondary | ICD-10-CM | POA: Diagnosis not present

## 2019-08-12 DIAGNOSIS — Z8631 Personal history of diabetic foot ulcer: Secondary | ICD-10-CM | POA: Diagnosis not present

## 2019-08-12 DIAGNOSIS — K219 Gastro-esophageal reflux disease without esophagitis: Secondary | ICD-10-CM | POA: Diagnosis not present

## 2019-08-12 DIAGNOSIS — E11610 Type 2 diabetes mellitus with diabetic neuropathic arthropathy: Secondary | ICD-10-CM | POA: Diagnosis not present

## 2019-08-12 DIAGNOSIS — E7849 Other hyperlipidemia: Secondary | ICD-10-CM | POA: Diagnosis not present

## 2019-08-12 DIAGNOSIS — Z794 Long term (current) use of insulin: Secondary | ICD-10-CM | POA: Diagnosis not present

## 2019-08-12 DIAGNOSIS — D62 Acute posthemorrhagic anemia: Secondary | ICD-10-CM | POA: Diagnosis not present

## 2019-08-12 DIAGNOSIS — Z9049 Acquired absence of other specified parts of digestive tract: Secondary | ICD-10-CM | POA: Diagnosis not present

## 2019-08-12 DIAGNOSIS — E1142 Type 2 diabetes mellitus with diabetic polyneuropathy: Secondary | ICD-10-CM | POA: Diagnosis not present

## 2019-08-12 DIAGNOSIS — Z87891 Personal history of nicotine dependence: Secondary | ICD-10-CM | POA: Diagnosis not present

## 2019-08-12 DIAGNOSIS — M2041 Other hammer toe(s) (acquired), right foot: Secondary | ICD-10-CM | POA: Diagnosis not present

## 2019-08-12 DIAGNOSIS — I10 Essential (primary) hypertension: Secondary | ICD-10-CM | POA: Diagnosis not present

## 2019-08-12 DIAGNOSIS — C187 Malignant neoplasm of sigmoid colon: Secondary | ICD-10-CM | POA: Diagnosis not present

## 2019-08-12 DIAGNOSIS — M6281 Muscle weakness (generalized): Secondary | ICD-10-CM | POA: Diagnosis not present

## 2019-08-12 NOTE — Chronic Care Management (AMB) (Signed)
  Chronic Care Management   Outreach Note  08/12/2019 Name: [name] MRN: [id number] DOB: [birth year]  Referred by: [name], DO Reason for referral : Chronic Care Management (Follow up call on Chronic disease Management and Care coordination needs. )   A second unsuccessful telephone outreach was attempted today. The patient was referred to the case management team for assistance with care management and care coordination.   Follow Up Plan: The care management team will reach out to the patient again over the next 30 to 60 days.   Noreene Larsson RN, MSN, [place] Mobile: [phone]

## 2019-08-13 ENCOUNTER — Telehealth: Payer: Self-pay | Admitting: Family Medicine

## 2019-08-13 ENCOUNTER — Ambulatory Visit: Payer: Self-pay

## 2019-08-13 DIAGNOSIS — R0602 Shortness of breath: Secondary | ICD-10-CM | POA: Diagnosis not present

## 2019-08-13 DIAGNOSIS — R079 Chest pain, unspecified: Secondary | ICD-10-CM | POA: Diagnosis not present

## 2019-08-13 NOTE — Telephone Encounter (Signed)
Caller name: Mary  Relation to pt: Publishing copy from [employer] back number: [phone]    Reason for call:  Requesting verbal orders regarding discharging patient from [facility] is requesting a call back today stating request was sent in last week, please advise

## 2019-08-13 NOTE — Chronic Care Management (AMB) (Signed)
  Care Management   Follow Up Note   08/13/2019 Name: BRINDLE LEYBA MRN: [id number] DOB: 06/11/1965  Referred by: [name], DO Reason for referral : Care Coordination   ANAIS DENSLOW is a 54 y.o. year old female who is a primary care patient of [name], DO. The care management team was consulted for assistance with care management and care coordination needs.    Review of patient status, including review of consultants reports, relevant laboratory and other test results, and collaboration with appropriate care team members and the patient's provider was performed as part of comprehensive patient evaluation and provision of chronic care management services.    LCSW completed CCM outreach attempt today but was unable to reach patient successfully. A HIPPA compliant voice message was left encouraging patient to return call once available. LCSW rescheduled CCM SW appointment as well.  A HIPPA compliant phone message was left for the patient providing contact information and requesting a return call.    [name], BSW, MSW, [email or web address] Phone: [phone]

## 2019-08-14 ENCOUNTER — Ambulatory Visit (INDEPENDENT_AMBULATORY_CARE_PROVIDER_SITE_OTHER): Payer: Medicare Other | Admitting: Pharmacist

## 2019-08-14 ENCOUNTER — Other Ambulatory Visit: Payer: Self-pay | Admitting: Family Medicine

## 2019-08-14 DIAGNOSIS — E1165 Type 2 diabetes mellitus with hyperglycemia: Secondary | ICD-10-CM | POA: Diagnosis not present

## 2019-08-14 DIAGNOSIS — E1149 Type 2 diabetes mellitus with other diabetic neurological complication: Secondary | ICD-10-CM

## 2019-08-14 DIAGNOSIS — I10 Essential (primary) hypertension: Secondary | ICD-10-CM

## 2019-08-14 DIAGNOSIS — K219 Gastro-esophageal reflux disease without esophagitis: Secondary | ICD-10-CM | POA: Diagnosis not present

## 2019-08-14 DIAGNOSIS — C187 Malignant neoplasm of sigmoid colon: Secondary | ICD-10-CM | POA: Diagnosis not present

## 2019-08-14 DIAGNOSIS — M6281 Muscle weakness (generalized): Secondary | ICD-10-CM | POA: Diagnosis not present

## 2019-08-14 DIAGNOSIS — IMO0002 Reserved for concepts with insufficient information to code with codable children: Secondary | ICD-10-CM

## 2019-08-14 DIAGNOSIS — E1142 Type 2 diabetes mellitus with diabetic polyneuropathy: Secondary | ICD-10-CM

## 2019-08-14 DIAGNOSIS — E7849 Other hyperlipidemia: Secondary | ICD-10-CM | POA: Diagnosis not present

## 2019-08-14 DIAGNOSIS — Z794 Long term (current) use of insulin: Secondary | ICD-10-CM | POA: Diagnosis not present

## 2019-08-14 DIAGNOSIS — Z87891 Personal history of nicotine dependence: Secondary | ICD-10-CM | POA: Diagnosis not present

## 2019-08-14 DIAGNOSIS — E11610 Type 2 diabetes mellitus with diabetic neuropathic arthropathy: Secondary | ICD-10-CM | POA: Diagnosis not present

## 2019-08-14 DIAGNOSIS — Z8631 Personal history of diabetic foot ulcer: Secondary | ICD-10-CM | POA: Diagnosis not present

## 2019-08-14 DIAGNOSIS — D500 Iron deficiency anemia secondary to blood loss (chronic): Secondary | ICD-10-CM

## 2019-08-14 DIAGNOSIS — Z483 Aftercare following surgery for neoplasm: Secondary | ICD-10-CM | POA: Diagnosis not present

## 2019-08-14 DIAGNOSIS — M2041 Other hammer toe(s) (acquired), right foot: Secondary | ICD-10-CM | POA: Diagnosis not present

## 2019-08-14 DIAGNOSIS — Z9049 Acquired absence of other specified parts of digestive tract: Secondary | ICD-10-CM | POA: Diagnosis not present

## 2019-08-14 DIAGNOSIS — Z8616 Personal history of COVID-19: Secondary | ICD-10-CM | POA: Diagnosis not present

## 2019-08-14 DIAGNOSIS — D62 Acute posthemorrhagic anemia: Secondary | ICD-10-CM | POA: Diagnosis not present

## 2019-08-14 MED ORDER — PANTOPRAZOLE SODIUM 40 MG PO TBEC: 40.0000 mg | DELAYED_RELEASE_TABLET | Freq: Two times a day (BID) | ORAL | 1 refills | Status: DC

## 2019-08-14 MED ORDER — GABAPENTIN 300 MG PO CAPS: 300.0000 mg | ORAL_CAPSULE | Freq: Every day | ORAL | 1 refills | Status: DC

## 2019-08-14 MED ORDER — FUROSEMIDE 40 MG PO TABS: 40.0000 mg | ORAL_TABLET | Freq: Every day | ORAL | 1 refills | Status: DC

## 2019-08-14 NOTE — Chronic Care Management (AMB) (Signed)
Chronic Care Management   Follow Up Note   08/14/2019 Name: [name] MRN: [id number] DOB: April 03, 1966  Referred by: [name], DO Reason for referral : Chronic Care Management (Caregiver Phone Call)   [name] is a 54 y.o. year old female who is a primary care patient of [name], DO. The CCM team was consulted for assistance with chronic disease management and care coordination needs.  [name] has a past medical history including but not limited to type 2 diabetes, adenocarcinoma of sigmoid colon, hypertension and hyperlipidemia.  I reached out to patient's daughter/caregiver by phone today.   Review of patient status, including review of consultants reports, relevant laboratory and other test results, and collaboration with appropriate care team members and the patient's provider was performed as part of comprehensive patient evaluation and provision of chronic care management services.     Outpatient Encounter Medications as of 08/14/2019  Medication Sig Note  . colestipol (COLESTID) 1 g tablet Take 2 tablets (2 g total) by mouth 2 (two) times daily. For diarrhea   . Ferrous Sulfate 27 MG TABS Take 1 tablet by mouth 2 (two) times daily with a meal.    . furosemide (LASIX) 40 MG tablet Take 1 tablet (40 mg total) by mouth daily.   . hydrochlorothiazide (HYDRODIURIL) 12.5 MG tablet Take 2 tablets (25 mg total) by mouth daily.   . Insulin Glargine (LANTUS SOLOSTAR) 100 UNIT/ML Solostar Pen Inject 60 Units into the skin daily before breakfast. (Patient taking differently: Inject 20 Units into the skin daily before breakfast. )   . insulin lispro (HUMALOG) 100 UNIT/ML KwikPen 12 units before meals plus sliding scale - blood sugar 150-200 add 2 units, blood sugar 201-250 add 4 units, blood sugar 251-300 add 6 units, blood sugar 301-350 add 8 units, blood sugar 351-400 add 10 units, max 66 units daily   . metFORMIN (GLUCOPHAGE) 1000 MG  tablet Take 1 tablet (1,000 mg total) by mouth 2 (two) times daily with a meal.   . metoprolol succinate (TOPROL-XL) 50 MG 24 hr tablet Take 1 tablet (50 mg total) by mouth daily. Take with or immediately following a meal.   . pantoprazole (PROTONIX) 40 MG tablet Take 1 tablet (40 mg total) by mouth 2 (two) times daily before a meal.   . acetaminophen (TYLENOL) 325 MG tablet Take 2 tablets (650 mg total) by mouth every 6 (six) hours as needed for mild pain.   . ARIPiprazole (ABILIFY) 10 MG tablet Take 10 mg by mouth at bedtime.    [name] ascorbic acid (VITAMIN C) 500 MG tablet Take 500 mg by mouth daily.   [name] aspirin EC 81 MG tablet Take 81 mg by mouth daily.    [name] atorvastatin (LIPITOR) 40 MG tablet Take 1 tablet (40 mg total) by mouth daily.   [name] buPROPion (WELLBUTRIN XL) 300 MG 24 hr tablet Take 300 mg by mouth daily.   [name] gabapentin (NEURONTIN) 300 MG capsule Take 1 capsule (300 mg total) by mouth at bedtime.   . Lancets (ONETOUCH ULTRASOFT) lancets Use to check blood sugar up to 3 x daily   . ondansetron (ZOFRAN) 4 MG tablet Take 1 tablet (4 mg total) by mouth every 6 (six) hours. (Patient taking differently: Take 4 mg by mouth every 8 (eight) hours as needed for nausea or vomiting. )   . ONETOUCH VERIO test strip Check blood sugar up to 5 x daily as needed   . traZODone (DESYREL)  50 MG tablet Take 1 tablet (50 mg total) by mouth at bedtime as needed for sleep. 07/17/2019: Have not started   . vitamin B-12 1000 MCG tablet Take 1 tablet (1,000 mcg total) by mouth daily.    No facility-administered encounter medications on file as of 08/14/2019.    Goals Addressed            This Visit's Progress   . PharmD - Medication Management       Current Barriers:  . Financial Barriers . Non adherence to prescribed medication regimen  Pharmacist Clinical Goal(s):  [name] Over the next 30 days, patient will work with [name] to address needs related to optimization of medication regimen and improvement  of medication adherence  Interventions: . Perform chart review . Follow up with patient's daughter/caregiver regarding patient's blood pressure control o Reports patient taking: - Furosemide 40 mg once daily - HCTZ 12.5 mg - 2 tablets (25 mg total) once daily - Metoprolol 50 mg ER once daily o Reports recent BPs have been good, ranging in 120-130s/70s . Follow up with daughter regarding iron supplementation o Reports patient continuing to take ferrous sulfate 27 mg twice daily o Remind daughter to have patient take with meals to reduce GI upset . Follow up regarding patient's CBG monitoring and control o Reports patient taking: - Metformin 1000 mg twice daily - Lantus 20 units daily  - Humalog according to sliding scale as provided by Endocrinology o Reports continuing to follow up with Endocrinology regularly regarding CBG results and dosing of Lantus and Humalog. Reports Humalog sliding scale last adjusted last week - Encourage to continue to follow closely with provider and regarding CBGs outside of established parameters . Caregiver requests assistance with obtaining refills of gabapentin, furosemide and pantoprazole for patient. Reports last ordered by provider at [facility]. o Will follow up with PCP  Patient Self Care Activities:  . Self administers medications as prescribed o Using weekly pillbox and calendar as adherence tools . Attends all scheduled provider appointments o Next appointment with Cardiology on 4/15 o Appointment with GI specialist scheduled for 4/21 o Next appointment with Oncology on 5/11 . Calls pharmacy for medication refills . Calls provider office for new concerns or questions . Patient to check blood sugar as directed by Endocrinologist and keep log  Please see past updates related to this goal by clicking on the "Past Updates" button in the selected goal         Plan  Telephone follow up appointment with care management team member scheduled for: 5/5  at 1:30 pm  Harlow Asa, PharmD, [place] [phone]

## 2019-08-14 NOTE — Patient Instructions (Signed)
Thank you allowing the Chronic Care Management Team to be a part of your care! It was a pleasure speaking with you today!     CCM (Chronic Care Management) Team    Noreene Larsson RN, MSN, CCM Nurse Care Coordinator  [phone]   Harlow Asa PharmD  Clinical Pharmacist  [phone]   [name] LCSW Clinical Social Worker [phone]  Visit Information  Goals Addressed            This Visit's Progress   . PharmD - Medication Management       Current Barriers:  . Financial Barriers . Non adherence to prescribed medication regimen  Pharmacist Clinical Goal(s):  [name] Over the next 30 days, patient will work with [name] to address needs related to optimization of medication regimen and improvement of medication adherence  Interventions: . Perform chart review . Follow up with patient's daughter/caregiver regarding patient's blood pressure control o Reports patient taking: - Furosemide 40 mg once daily - HCTZ 12.5 mg - 2 tablets (25 mg total) once daily - Metoprolol 50 mg ER once daily o Reports recent BPs have been good, ranging in 120-130s/70s . Follow up with daughter regarding iron supplementation o Reports patient continuing to take ferrous sulfate 27 mg twice daily o Remind daughter to have patient take with meals to reduce GI upset . Follow up regarding patient's CBG monitoring and control o Reports patient taking: - Metformin 1000 mg twice daily - Lantus 20 units daily  - Humalog according to sliding scale as provided by Endocrinology o Reports continuing to follow up with Endocrinology regularly regarding CBG results and dosing of Lantus and Humalog. Reports Humalog sliding scale last adjusted last week - Encourage to continue to follow closely with provider and regarding CBGs outside of established parameters . Caregiver requests assistance with obtaining refills of gabapentin, furosemide and pantoprazole for patient. Reports last ordered by  provider at [facility]. o Will follow up with PCP  Patient Self Care Activities:  . Self administers medications as prescribed o Using weekly pillbox and calendar as adherence tools . Attends all scheduled provider appointments o Next appointment with Cardiology on 4/15 o Appointment with GI specialist scheduled for 4/21 o Next appointment with Oncology on 5/11 . Calls pharmacy for medication refills . Calls provider office for new concerns or questions . Patient to check blood sugar as directed by Endocrinologist and keep log  Please see past updates related to this goal by clicking on the "Past Updates" button in the selected goal         Patient verbalizes understanding of instructions provided today.   Telephone follow up appointment with care management team member scheduled for: 5/5 at 1:30 pm  Harlow Asa, PharmD, [place] [phone]

## 2019-08-15 ENCOUNTER — Other Ambulatory Visit: Payer: Self-pay | Admitting: Family Medicine

## 2019-08-15 DIAGNOSIS — Z9049 Acquired absence of other specified parts of digestive tract: Secondary | ICD-10-CM | POA: Diagnosis not present

## 2019-08-15 DIAGNOSIS — E11610 Type 2 diabetes mellitus with diabetic neuropathic arthropathy: Secondary | ICD-10-CM | POA: Diagnosis not present

## 2019-08-15 DIAGNOSIS — M6281 Muscle weakness (generalized): Secondary | ICD-10-CM | POA: Diagnosis not present

## 2019-08-15 DIAGNOSIS — E7849 Other hyperlipidemia: Secondary | ICD-10-CM | POA: Diagnosis not present

## 2019-08-15 DIAGNOSIS — Z87891 Personal history of nicotine dependence: Secondary | ICD-10-CM | POA: Diagnosis not present

## 2019-08-15 DIAGNOSIS — C187 Malignant neoplasm of sigmoid colon: Secondary | ICD-10-CM | POA: Diagnosis not present

## 2019-08-15 DIAGNOSIS — Z794 Long term (current) use of insulin: Secondary | ICD-10-CM | POA: Diagnosis not present

## 2019-08-15 DIAGNOSIS — Z8616 Personal history of covid-19: Secondary | ICD-10-CM | POA: Diagnosis not present

## 2019-08-15 DIAGNOSIS — E1142 Type 2 diabetes mellitus with diabetic polyneuropathy: Secondary | ICD-10-CM | POA: Diagnosis not present

## 2019-08-15 DIAGNOSIS — Z8631 Personal history of diabetic foot ulcer: Secondary | ICD-10-CM | POA: Diagnosis not present

## 2019-08-15 DIAGNOSIS — E1165 Type 2 diabetes mellitus with hyperglycemia: Secondary | ICD-10-CM | POA: Diagnosis not present

## 2019-08-15 DIAGNOSIS — I10 Essential (primary) hypertension: Secondary | ICD-10-CM | POA: Diagnosis not present

## 2019-08-15 DIAGNOSIS — Z483 Aftercare following surgery for neoplasm: Secondary | ICD-10-CM | POA: Diagnosis not present

## 2019-08-15 DIAGNOSIS — K219 Gastro-esophageal reflux disease without esophagitis: Secondary | ICD-10-CM | POA: Diagnosis not present

## 2019-08-15 DIAGNOSIS — M2041 Other hammer toe(s) (acquired), right foot: Secondary | ICD-10-CM | POA: Diagnosis not present

## 2019-08-15 DIAGNOSIS — D62 Acute posthemorrhagic anemia: Secondary | ICD-10-CM | POA: Diagnosis not present

## 2019-08-15 MED ORDER — METOPROLOL SUCCINATE ER 50 MG PO TB24: 50.0000 mg | ORAL_TABLET | Freq: Every day | ORAL | 0 refills | Status: DC

## 2019-08-15 NOTE — Telephone Encounter (Signed)
Medication Refill - Medication: metoprolol succinate (TOPROL-XL) 50 MG 24 hr tablet    Preferred Pharmacy (with phone number or street name):  [id number] - [place], [address] Phone:  [phone]  Fax:  [phone]       Agent: Please be advised that RX refills may take up to 3 business days. We ask that you follow-up with your pharmacy.

## 2019-08-15 NOTE — Telephone Encounter (Signed)
Requested Prescriptions  Pending Prescriptions Disp Refills  . metoprolol succinate (TOPROL-XL) 50 MG 24 hr tablet 90 tablet 0    Sig: Take 1 tablet (50 mg total) by mouth daily. Take with or immediately following a meal.     Cardiovascular:  Beta Blockers Passed - 08/15/2019 12:22 PM      Passed - Last BP in normal range    BP Readings from Last 1 Encounters:  07/25/19 112/71         Passed - Last Heart Rate in normal range    Pulse Readings from Last 1 Encounters:  07/25/19 68         Passed - Valid encounter within last 6 months    Recent Outpatient Visits          3 weeks ago Adenocarcinoma of sigmoid colon [facility])   [place], DO   1 month ago Essential hypertension   [place], DO   4 months ago Essential hypertension   [place], DO   5 months ago Charcot's joint of left foot   [place], DO   6 months ago DM (diabetes mellitus), type 2, uncontrolled w/neurologic complication [facility])   [facility] [name], DO      Future Appointments            In 6 days Vanga, Tally Due, MD [facility] [place]

## 2019-08-19 DIAGNOSIS — M14672 Charcot's joint, left ankle and foot: Secondary | ICD-10-CM | POA: Diagnosis not present

## 2019-08-19 DIAGNOSIS — B351 Tinea unguium: Secondary | ICD-10-CM | POA: Diagnosis not present

## 2019-08-19 DIAGNOSIS — M7989 Other specified soft tissue disorders: Secondary | ICD-10-CM | POA: Diagnosis not present

## 2019-08-19 DIAGNOSIS — E1142 Type 2 diabetes mellitus with diabetic polyneuropathy: Secondary | ICD-10-CM | POA: Diagnosis not present

## 2019-08-19 DIAGNOSIS — E1159 Type 2 diabetes mellitus with other circulatory complications: Secondary | ICD-10-CM | POA: Diagnosis not present

## 2019-08-19 DIAGNOSIS — E1169 Type 2 diabetes mellitus with other specified complication: Secondary | ICD-10-CM | POA: Diagnosis not present

## 2019-08-19 DIAGNOSIS — E1151 Type 2 diabetes mellitus with diabetic peripheral angiopathy without gangrene: Secondary | ICD-10-CM | POA: Diagnosis not present

## 2019-08-19 DIAGNOSIS — R002 Palpitations: Secondary | ICD-10-CM | POA: Diagnosis not present

## 2019-08-19 DIAGNOSIS — L853 Xerosis cutis: Secondary | ICD-10-CM | POA: Diagnosis not present

## 2019-08-20 ENCOUNTER — Ambulatory Visit: Payer: Medicare Other | Admitting: Family Medicine

## 2019-08-20 DIAGNOSIS — E7849 Other hyperlipidemia: Secondary | ICD-10-CM | POA: Diagnosis not present

## 2019-08-20 DIAGNOSIS — Z483 Aftercare following surgery for neoplasm: Secondary | ICD-10-CM | POA: Diagnosis not present

## 2019-08-20 DIAGNOSIS — Z8631 Personal history of diabetic foot ulcer: Secondary | ICD-10-CM | POA: Diagnosis not present

## 2019-08-20 DIAGNOSIS — K219 Gastro-esophageal reflux disease without esophagitis: Secondary | ICD-10-CM | POA: Diagnosis not present

## 2019-08-20 DIAGNOSIS — E1142 Type 2 diabetes mellitus with diabetic polyneuropathy: Secondary | ICD-10-CM | POA: Diagnosis not present

## 2019-08-20 DIAGNOSIS — E1165 Type 2 diabetes mellitus with hyperglycemia: Secondary | ICD-10-CM | POA: Diagnosis not present

## 2019-08-20 DIAGNOSIS — Z87891 Personal history of nicotine dependence: Secondary | ICD-10-CM | POA: Diagnosis not present

## 2019-08-20 DIAGNOSIS — E11610 Type 2 diabetes mellitus with diabetic neuropathic arthropathy: Secondary | ICD-10-CM | POA: Diagnosis not present

## 2019-08-20 DIAGNOSIS — M2041 Other hammer toe(s) (acquired), right foot: Secondary | ICD-10-CM | POA: Diagnosis not present

## 2019-08-20 DIAGNOSIS — Z9049 Acquired absence of other specified parts of digestive tract: Secondary | ICD-10-CM | POA: Diagnosis not present

## 2019-08-20 DIAGNOSIS — C187 Malignant neoplasm of sigmoid colon: Secondary | ICD-10-CM | POA: Diagnosis not present

## 2019-08-20 DIAGNOSIS — M6281 Muscle weakness (generalized): Secondary | ICD-10-CM | POA: Diagnosis not present

## 2019-08-20 DIAGNOSIS — D62 Acute posthemorrhagic anemia: Secondary | ICD-10-CM | POA: Diagnosis not present

## 2019-08-20 DIAGNOSIS — Z8616 Personal history of COVID-19: Secondary | ICD-10-CM | POA: Diagnosis not present

## 2019-08-20 DIAGNOSIS — Z794 Long term (current) use of insulin: Secondary | ICD-10-CM | POA: Diagnosis not present

## 2019-08-20 DIAGNOSIS — I10 Essential (primary) hypertension: Secondary | ICD-10-CM | POA: Diagnosis not present

## 2019-08-21 ENCOUNTER — Ambulatory Visit: Payer: Medicare Other | Admitting: Gastroenterology

## 2019-08-22 ENCOUNTER — Telehealth: Payer: Self-pay | Admitting: General Practice

## 2019-08-22 ENCOUNTER — Ambulatory Visit: Payer: Self-pay | Admitting: General Practice

## 2019-08-22 DIAGNOSIS — I10 Essential (primary) hypertension: Secondary | ICD-10-CM

## 2019-08-22 DIAGNOSIS — E1165 Type 2 diabetes mellitus with hyperglycemia: Secondary | ICD-10-CM | POA: Diagnosis not present

## 2019-08-22 DIAGNOSIS — E1142 Type 2 diabetes mellitus with diabetic polyneuropathy: Secondary | ICD-10-CM | POA: Diagnosis not present

## 2019-08-22 DIAGNOSIS — M6281 Muscle weakness (generalized): Secondary | ICD-10-CM | POA: Diagnosis not present

## 2019-08-22 DIAGNOSIS — IMO0002 Reserved for concepts with insufficient information to code with codable children: Secondary | ICD-10-CM

## 2019-08-22 DIAGNOSIS — E1149 Type 2 diabetes mellitus with other diabetic neurological complication: Secondary | ICD-10-CM | POA: Diagnosis not present

## 2019-08-22 DIAGNOSIS — Z9049 Acquired absence of other specified parts of digestive tract: Secondary | ICD-10-CM | POA: Diagnosis not present

## 2019-08-22 DIAGNOSIS — Z87891 Personal history of nicotine dependence: Secondary | ICD-10-CM | POA: Diagnosis not present

## 2019-08-22 DIAGNOSIS — D500 Iron deficiency anemia secondary to blood loss (chronic): Secondary | ICD-10-CM

## 2019-08-22 DIAGNOSIS — K219 Gastro-esophageal reflux disease without esophagitis: Secondary | ICD-10-CM | POA: Diagnosis not present

## 2019-08-22 DIAGNOSIS — E1169 Type 2 diabetes mellitus with other specified complication: Secondary | ICD-10-CM

## 2019-08-22 DIAGNOSIS — E11610 Type 2 diabetes mellitus with diabetic neuropathic arthropathy: Secondary | ICD-10-CM | POA: Diagnosis not present

## 2019-08-22 DIAGNOSIS — M2041 Other hammer toe(s) (acquired), right foot: Secondary | ICD-10-CM | POA: Diagnosis not present

## 2019-08-22 DIAGNOSIS — E7849 Other hyperlipidemia: Secondary | ICD-10-CM | POA: Diagnosis not present

## 2019-08-22 DIAGNOSIS — E785 Hyperlipidemia, unspecified: Secondary | ICD-10-CM | POA: Diagnosis not present

## 2019-08-22 DIAGNOSIS — Z483 Aftercare following surgery for neoplasm: Secondary | ICD-10-CM | POA: Diagnosis not present

## 2019-08-22 DIAGNOSIS — Z8631 Personal history of diabetic foot ulcer: Secondary | ICD-10-CM | POA: Diagnosis not present

## 2019-08-22 DIAGNOSIS — D62 Acute posthemorrhagic anemia: Secondary | ICD-10-CM | POA: Diagnosis not present

## 2019-08-22 DIAGNOSIS — F251 Schizoaffective disorder, depressive type: Secondary | ICD-10-CM

## 2019-08-22 DIAGNOSIS — Z794 Long term (current) use of insulin: Secondary | ICD-10-CM | POA: Diagnosis not present

## 2019-08-22 DIAGNOSIS — C187 Malignant neoplasm of sigmoid colon: Secondary | ICD-10-CM | POA: Diagnosis not present

## 2019-08-22 DIAGNOSIS — Z8616 Personal history of covid-19: Secondary | ICD-10-CM | POA: Diagnosis not present

## 2019-08-22 NOTE — Chronic Care Management (AMB) (Signed)
Chronic Care Management   Follow Up Note   08/22/2019 Name: [name] MRN: [id number] DOB: 02-Sep-1965  Referred by: [name], DO Reason for referral : Chronic Care Management (Follow up: RNCM Chronic Disease Management and Care coordination needs)   [name] is a 54 y.o. year old female who is a primary care patient of [name], DO. The CCM team was consulted for assistance with chronic disease management and care coordination needs.    Review of patient status, including review of consultants reports, relevant laboratory and other test results, and collaboration with appropriate care team members and the patient's provider was performed as part of comprehensive patient evaluation and provision of chronic care management services.    SDOH (Social Determinants of Health) assessments performed: Yes See Care Plan activities for detailed interventions related to SDOH)  SDOH Interventions     Most Recent Value  SDOH Interventions  SDOH Interventions for the Following Domains  Physical Activity, Social Connections  Physical Activity Interventions  Other (Comments) [the patient is working with PT in her home]  Social Connections Interventions  Other (Comment) [the patient has good support system]       Outpatient Encounter Medications as of 08/22/2019  Medication Sig Note  . acetaminophen (TYLENOL) 325 MG tablet Take 2 tablets (650 mg total) by mouth every 6 (six) hours as needed for mild pain.   . ARIPiprazole (ABILIFY) 10 MG tablet Take 10 mg by mouth at bedtime.    [name] ascorbic acid (VITAMIN C) 500 MG tablet Take 500 mg by mouth daily.   [name] aspirin EC 81 MG tablet Take 81 mg by mouth daily.    [name] atorvastatin (LIPITOR) 40 MG tablet Take 1 tablet (40 mg total) by mouth daily.   [name] buPROPion (WELLBUTRIN XL) 300 MG 24 hr tablet Take 300 mg by mouth daily.   . colestipol (COLESTID) 1 g tablet Take 2 tablets (2 g total) by mouth 2 (two) times  daily. For diarrhea   . Ferrous Sulfate 27 MG TABS Take 1 tablet by mouth 2 (two) times daily with a meal.    . furosemide (LASIX) 40 MG tablet Take 1 tablet (40 mg total) by mouth daily.   [name] gabapentin (NEURONTIN) 300 MG capsule Take 1 capsule (300 mg total) by mouth at bedtime.   . hydrochlorothiazide (HYDRODIURIL) 12.5 MG tablet Take 2 tablets (25 mg total) by mouth daily.   . Insulin Glargine (LANTUS SOLOSTAR) 100 UNIT/ML Solostar Pen Inject 60 Units into the skin daily before breakfast. (Patient taking differently: Inject 20 Units into the skin daily before breakfast. )   . insulin lispro (HUMALOG) 100 UNIT/ML KwikPen 12 units before meals plus sliding scale - blood sugar 150-200 add 2 units, blood sugar 201-250 add 4 units, blood sugar 251-300 add 6 units, blood sugar 301-350 add 8 units, blood sugar 351-400 add 10 units, max 66 units daily   . Lancets (ONETOUCH ULTRASOFT) lancets Use to check blood sugar up to 3 x daily   . metFORMIN (GLUCOPHAGE) 1000 MG tablet Take 1 tablet (1,000 mg total) by mouth 2 (two) times daily with a meal.   . metoprolol succinate (TOPROL-XL) 50 MG 24 hr tablet Take 1 tablet (50 mg total) by mouth daily. Take with or immediately following a meal.   . ondansetron (ZOFRAN) 4 MG tablet Take 1 tablet (4 mg total) by mouth every 6 (six) hours. (Patient taking differently: Take 4 mg by mouth every 8 (eight) hours  as needed for nausea or vomiting. )   . ONETOUCH VERIO test strip Check blood sugar up to 5 x daily as needed   . pantoprazole (PROTONIX) 40 MG tablet Take 1 tablet (40 mg total) by mouth 2 (two) times daily before a meal.   . traZODone (DESYREL) 50 MG tablet Take 1 tablet (50 mg total) by mouth at bedtime as needed for sleep. 07/17/2019: Have not started   . vitamin B-12 1000 MCG tablet Take 1 tablet (1,000 mcg total) by mouth daily.    No facility-administered encounter medications on file as of 08/22/2019.     Objective:  BP Readings from Last 3 Encounters:    08/22/19 (!) 144/76  07/25/19 112/71  07/25/19 127/82    Goals Addressed            This Visit's Progress   . COMPLETED: RNCM- I want to get my diabetes back on track (pt-stated)       Current Barriers:  [name] Knowledge Deficits related to basic Diabetes pathophysiology and self care/management: Does not use CBG meter, has had to be seen in ED for blood sugars >500 . Knowledge Deficits related to medications used for management of diabetes   Case Manager Clinical Goal(s):  Over the next 120 days, patient will demonstrate improved adherence to prescribed treatment plan for diabetes self care/management as evidenced by:  . daily monitoring and recording of CBG  . adherence to ADA/ carb modified diet . exercise 3 days/week . adherence to prescribed medication regimen : Daughter giving patient all of her daily medications, the daughter says the patient will not take her medication if she does not direct her to take- this is still a concern as per the daughter today . Decreased visits to the ED as evidence by patient being more independent and actively participating in her care: Patient recently in the ED related to cellulitis and other chronic disease processes-Called today for post discharge assessment and follow up  Interventions:  . Evaluated current diabetes management.  The daughter states he blood sugars are still elevated and that she will hear the patient in the kitchen at night. The patient is sleeping during the day and staying up at night. . Assessed current medication administration and blood glucose monitoring.  The daughter states the patient will not take meds on her own without being prompted and is still not doing self care    Patient Self Care Activities:  . UNABLE to independently self manage diabetes as evidenced by A1C >10  Please see past updates related to this goal by clicking on the "Past Updates" button in the selected goal      . RNCM: Pt-"I am living on my  own now" (pt-stated)       Current Barriers:  . Chronic Disease Managemant support, education, and care coordination needs related to HTN, DMII, and Schizo affective disorder  Clinical Goal(s) related to HTN, DMII, and Schizo affective disorder :  Over the next 120 days, patient will: . Work with the care management team to address educational, disease management, and care coordination needs  . Begin or continue self health monitoring activities as directed today The patients daughter, [name] is concerned because the patient is not taking her medications as directed, not doing daily self care, and refusing things just like she did before hospitalization.   . Call provider office for new or worsened signs and symptoms New or worsened symptom related to HTN, DM and Schizoaffective disorder . Call care  management team with questions or concerns . Verbalize basic understanding of patient centered plan of care established today   Interventions related to HTN, DMII, and Schizo affective disorder :  . Evaluation of current treatment plans and patient's adherence to plan as established by provider  . Assessed patient understanding of disease states: RNCM spoke to the patient for the first time today since working with the patient. The patient was happy and is adjusting to her new living arrangements. She was able to give an accurate recap of her cardiology appointment on Monday. The patient feels she is doing well and wants to stay out of the hospital.  . Assessed patient's education and care coordination needs: Working with the [facility] and Pharmacist.  The patient has her medications in a pill container and knows when to take her medications. She verbalized she has multiple medications that she takes but she is taking as prescribed. She is taking her iron supplements along with other Vitamins . Provided disease specific education to the patient.  The patient is watching her diet and eating a heart  Healthy/ADA diet.  Her daughter has prepared foods for the patient and all she has to do is warm them up in the microwave. The patient verbalized she is taking her blood sugars and blood pressures at home. This am her blood sugar was 145 and her blood pressure was 144/76.  Nash Dimmer with appropriate clinical care team members regarding patient needs . Report made to APS of [facility] related to concerns and safety of the patient 05-22-2019.  Melinda with APS of [facility] has returned a call and states the report has been accepted and has been assigned to [employer]- Completed . Assessed the for evaluation for psychiatric needs. The daughter expressed they did an evaluation on the patient in the hospital and said they could not find any reason for her to be kept for psychiatric needs. Completed   . Assessed current living arrangements and safety concerns. : 08-22-2019: The patient is now living in her own apartment that is near her daughter. This is the first time she has ever lived alone. She feels she is adjusting well. The patient sounded very positive and states that home health is working with her and also PT.  Spoke briefly to the patients daughter [name] and she feels this is a positive thing for her mom.  . Assessed depression and social isolation needs.  The patient feels safe in her new apartment.  She is working on making friends in the apartment complex and she also is "adjusting well".  She was a little sad when discussing her mother.  Her mother is now in a memory care facility in [place] close to where her brother lives. She gets to talk to her 2 times per week on the phone. This is a huge adjustment but she knows it is for the best. She says her daughter will take her for a visit soon. She is decorating her apartment and will have all her furniture next week. She is enjoying taking a shower and feels she is doing a good job at taking care of herself.   Patient Self Care  Activities related to HTN, DMII, and schizo affective disorder :  . Patient is unable to independently self-manage chronic health conditions  Please see past updates related to this goal by clicking on the "Past Updates" button in the selected goal  dates.         Plan:  The care management team will reach out to the patient again over the next 30 to 60 days.    Noreene Larsson RN, MSN, [place] Mobile: [phone]

## 2019-08-22 NOTE — Patient Instructions (Signed)
Visit Information  Goals Addressed            This Visit's Progress   . COMPLETED: RNCM- I want to get my diabetes back on track (pt-stated)       Current Barriers:  [name] Knowledge Deficits related to basic Diabetes pathophysiology and self care/management: Does not use CBG meter, has had to be seen in ED for blood sugars >500 . Knowledge Deficits related to medications used for management of diabetes   Case Manager Clinical Goal(s):  Over the next 120 days, patient will demonstrate improved adherence to prescribed treatment plan for diabetes self care/management as evidenced by:  . daily monitoring and recording of CBG  . adherence to ADA/ carb modified diet . exercise 3 days/week . adherence to prescribed medication regimen : Daughter giving patient all of her daily medications, the daughter says the patient will not take her medication if she does not direct her to take- this is still a concern as per the daughter today . Decreased visits to the ED as evidence by patient being more independent and actively participating in her care: Patient recently in the ED related to cellulitis and other chronic disease processes-Called today for post discharge assessment and follow up  Interventions:  . Evaluated current diabetes management.  The daughter states he blood sugars are still elevated and that she will hear the patient in the kitchen at night. The patient is sleeping during the day and staying up at night. . Assessed current medication administration and blood glucose monitoring.  The daughter states the patient will not take meds on her own without being prompted and is still not doing self care    Patient Self Care Activities:  . UNABLE to independently self manage diabetes as evidenced by A1C >10  Please see past updates related to this goal by clicking on the "Past Updates" button in the selected goal      . RNCM: Pt-"I am living on my own now" (pt-stated)       Current Barriers:   . Chronic Disease Managemant support, education, and care coordination needs related to HTN, DMII, and Schizo affective disorder  Clinical Goal(s) related to HTN, DMII, and Schizo affective disorder :  Over the next 120 days, patient will: . Work with the care management team to address educational, disease management, and care coordination needs  . Begin or continue self health monitoring activities as directed today The patients daughter, [name] is concerned because the patient is not taking her medications as directed, not doing daily self care, and refusing things just like she did before hospitalization.   . Call provider office for new or worsened signs and symptoms New or worsened symptom related to HTN, DM and Schizoaffective disorder . Call care management team with questions or concerns . Verbalize basic understanding of patient centered plan of care established today   Interventions related to HTN, DMII, and Schizo affective disorder :  . Evaluation of current treatment plans and patient's adherence to plan as established by provider  . Assessed patient understanding of disease states: RNCM spoke to the patient for the first time today since working with the patient. The patient was happy and is adjusting to her new living arrangements. She was able to give an accurate recap of her cardiology appointment on Monday. The patient feels she is doing well and wants to stay out of the hospital.  . Assessed patient's education and care coordination needs: Working with the [place] and Pharmacist.  The patient has her medications in a pill container and knows when to take her medications. She verbalized she has multiple medications that she takes but she is taking as prescribed. She is taking her iron supplements along with other Vitamins . Provided disease specific education to the patient.  The patient is watching her diet and eating a heart Healthy/ADA diet.  Her daughter has prepared foods for  the patient and all she has to do is warm them up in the microwave. The patient verbalized she is taking her blood sugars and blood pressures at home. This am her blood sugar was 145 and her blood pressure was 144/76.  Nash Dimmer with appropriate clinical care team members regarding patient needs . Report made to APS of [facility] related to concerns and safety of the patient 05-22-2019.  Melinda with APS of [facility] has returned a call and states the report has been accepted and has been assigned to [employer]- Completed . Assessed the for evaluation for psychiatric needs. The daughter expressed they did an evaluation on the patient in the hospital and said they could not find any reason for her to be kept for psychiatric needs. Completed   . Assessed current living arrangements and safety concerns. : 08-22-2019: The patient is now living in her own apartment that is near her daughter. This is the first time she has ever lived alone. She feels she is adjusting well. The patient sounded very positive and states that home health is working with her and also PT.  Spoke briefly to the patients daughter [name] and she feels this is a positive thing for her mom.  . Assessed depression and social isolation needs.  The patient feels safe in her new apartment.  She is working on making friends in the apartment complex and she also is "adjusting well".  She was a little sad when discussing her mother.  Her mother is now in a memory care facility in [place] close to where her brother lives. She gets to talk to her 2 times per week on the phone. This is a huge adjustment but she knows it is for the best. She says her daughter will take her for a visit soon. She is decorating her apartment and will have all her furniture next week. She is enjoying taking a shower and feels she is doing a good job at taking care of herself.   Patient Self Care Activities related to HTN, DMII, and schizo affective  disorder :  . Patient is unable to independently self-manage chronic health conditions  Please see past updates related to this goal by clicking on the "Past Updates" button in the selected goal  dates.        Patient verbalizes understanding of instructions provided today.   The care management team will reach out to the patient again over the next 30 to 60 days.    Noreene Larsson RN, MSN, [place] Mobile: [phone]

## 2019-08-28 ENCOUNTER — Other Ambulatory Visit: Payer: Self-pay | Admitting: Family Medicine

## 2019-08-28 DIAGNOSIS — K219 Gastro-esophageal reflux disease without esophagitis: Secondary | ICD-10-CM | POA: Diagnosis not present

## 2019-08-28 DIAGNOSIS — Z9049 Acquired absence of other specified parts of digestive tract: Secondary | ICD-10-CM

## 2019-08-28 DIAGNOSIS — Z8631 Personal history of diabetic foot ulcer: Secondary | ICD-10-CM | POA: Diagnosis not present

## 2019-08-28 DIAGNOSIS — I10 Essential (primary) hypertension: Secondary | ICD-10-CM | POA: Diagnosis not present

## 2019-08-28 DIAGNOSIS — E1165 Type 2 diabetes mellitus with hyperglycemia: Secondary | ICD-10-CM | POA: Diagnosis not present

## 2019-08-28 DIAGNOSIS — M2041 Other hammer toe(s) (acquired), right foot: Secondary | ICD-10-CM | POA: Diagnosis not present

## 2019-08-28 DIAGNOSIS — D62 Acute posthemorrhagic anemia: Secondary | ICD-10-CM | POA: Diagnosis not present

## 2019-08-28 DIAGNOSIS — R197 Diarrhea, unspecified: Secondary | ICD-10-CM

## 2019-08-28 DIAGNOSIS — C187 Malignant neoplasm of sigmoid colon: Secondary | ICD-10-CM | POA: Diagnosis not present

## 2019-08-28 DIAGNOSIS — K909 Intestinal malabsorption, unspecified: Secondary | ICD-10-CM

## 2019-08-28 DIAGNOSIS — Z87891 Personal history of nicotine dependence: Secondary | ICD-10-CM | POA: Diagnosis not present

## 2019-08-28 DIAGNOSIS — Z794 Long term (current) use of insulin: Secondary | ICD-10-CM | POA: Diagnosis not present

## 2019-08-28 DIAGNOSIS — M6281 Muscle weakness (generalized): Secondary | ICD-10-CM | POA: Diagnosis not present

## 2019-08-28 DIAGNOSIS — E11610 Type 2 diabetes mellitus with diabetic neuropathic arthropathy: Secondary | ICD-10-CM | POA: Diagnosis not present

## 2019-08-28 DIAGNOSIS — E1142 Type 2 diabetes mellitus with diabetic polyneuropathy: Secondary | ICD-10-CM | POA: Diagnosis not present

## 2019-08-28 DIAGNOSIS — Z483 Aftercare following surgery for neoplasm: Secondary | ICD-10-CM | POA: Diagnosis not present

## 2019-08-28 DIAGNOSIS — Z8616 Personal history of COVID-19: Secondary | ICD-10-CM | POA: Diagnosis not present

## 2019-08-28 DIAGNOSIS — E7849 Other hyperlipidemia: Secondary | ICD-10-CM | POA: Diagnosis not present

## 2019-08-28 NOTE — Telephone Encounter (Signed)
Requested medications are due for refill today?  Yes  Requested medications are on active medication list?  Yes  Last Refill:  07/31/2018  # 120 with no refills   Future visit scheduled?  No  Notes to Clinic:  Medication failed RX refill protocol due to no lab work within 360 days.  Last labs performed on 11/07/2017.

## 2019-08-29 ENCOUNTER — Ambulatory Visit: Payer: Self-pay | Admitting: *Deleted

## 2019-08-29 DIAGNOSIS — K219 Gastro-esophageal reflux disease without esophagitis: Secondary | ICD-10-CM | POA: Diagnosis not present

## 2019-08-29 DIAGNOSIS — Z8631 Personal history of diabetic foot ulcer: Secondary | ICD-10-CM | POA: Diagnosis not present

## 2019-08-29 DIAGNOSIS — E1165 Type 2 diabetes mellitus with hyperglycemia: Secondary | ICD-10-CM | POA: Diagnosis not present

## 2019-08-29 DIAGNOSIS — M2041 Other hammer toe(s) (acquired), right foot: Secondary | ICD-10-CM | POA: Diagnosis not present

## 2019-08-29 DIAGNOSIS — Z87891 Personal history of nicotine dependence: Secondary | ICD-10-CM | POA: Diagnosis not present

## 2019-08-29 DIAGNOSIS — Z8616 Personal history of COVID-19: Secondary | ICD-10-CM | POA: Diagnosis not present

## 2019-08-29 DIAGNOSIS — I10 Essential (primary) hypertension: Secondary | ICD-10-CM | POA: Diagnosis not present

## 2019-08-29 DIAGNOSIS — Z9049 Acquired absence of other specified parts of digestive tract: Secondary | ICD-10-CM | POA: Diagnosis not present

## 2019-08-29 DIAGNOSIS — Z794 Long term (current) use of insulin: Secondary | ICD-10-CM | POA: Diagnosis not present

## 2019-08-29 DIAGNOSIS — E1142 Type 2 diabetes mellitus with diabetic polyneuropathy: Secondary | ICD-10-CM | POA: Diagnosis not present

## 2019-08-29 DIAGNOSIS — C187 Malignant neoplasm of sigmoid colon: Secondary | ICD-10-CM | POA: Diagnosis not present

## 2019-08-29 DIAGNOSIS — Z483 Aftercare following surgery for neoplasm: Secondary | ICD-10-CM | POA: Diagnosis not present

## 2019-08-29 DIAGNOSIS — M14672 Charcot's joint, left ankle and foot: Secondary | ICD-10-CM | POA: Diagnosis not present

## 2019-08-29 DIAGNOSIS — D62 Acute posthemorrhagic anemia: Secondary | ICD-10-CM | POA: Diagnosis not present

## 2019-08-29 DIAGNOSIS — E11610 Type 2 diabetes mellitus with diabetic neuropathic arthropathy: Secondary | ICD-10-CM | POA: Diagnosis not present

## 2019-08-29 DIAGNOSIS — E7849 Other hyperlipidemia: Secondary | ICD-10-CM | POA: Diagnosis not present

## 2019-08-29 DIAGNOSIS — M6281 Muscle weakness (generalized): Secondary | ICD-10-CM | POA: Diagnosis not present

## 2019-08-29 NOTE — Telephone Encounter (Signed)
Experiencing dizziness this morning. Lightheaded and some vertigo. Has been constant today. Very little water yesterday. Decreased urine output with concentrated urine now along with a burning sensation. Has dry mouth and lips.Encouraged increasing water intake to 5-6 glasses daily beginning now. Avoid the heat. Lie down with feet elevated. Denies CP/SOB/Fever/Cough/one sided weakness/visual changes. Advised ED visit at this time to be driven by her daughter. Routing to clinic.  Reason for Disposition . [1] Drinking very little AND [2] dehydration suspected (e.g., no urine > 12 hours, very dry mouth, very lightheaded)  Answer Assessment - Initial Assessment Questions 1. DESCRIPTION: "Describe your dizziness."     Weak lightheaded and hot  2. LIGHTHEADED: "Do you feel lightheaded?" (e.g., somewhat faint, woozy, weak upon standing)     Upon standing and sitting 3. VERTIGO: "Do you feel like either you or the room is spinning or tilting?" (i.e. vertigo)    Yes at times this morning. 4. SEVERITY: "How bad is it?"  "Do you feel like you are going to faint?" "Can you stand and walk?"   - MILD - walking normally   - MODERATE - interferes with normal activities (e.g., work, school)    - SEVERE - unable to stand, requires support to walk, feels like passing out now.     Severe. Using a wheelchair this morning because unstable with the walker. 5. ONSET:  "When did the dizziness begin?"    This morning 6. AGGRAVATING FACTORS: "Does anything make it worse?" (e.g., standing, change in head position)     standing 7. HEART RATE: "Can you tell me your heart rate?" "How many beats in 15 seconds?"  (Note: not all patients can do this)      no 8. CAUSE: "What do you think is causing the dizziness?"     May be dehydrated. 9. RECURRENT SYMPTOM: "Have you had dizziness before?" If so, ask: "When was the last time?" "What happened that time?"     Yes, about one month-dehydration.Had to get IV fluids 10. OTHER  SYMPTOMS: "Do you have any other symptoms?" (e.g., fever, chest pain, vomiting, diarrhea, bleeding)       Denies CP/SOB/vomiting/diarrhea 11. PREGNANCY: "Is there any chance you are pregnant?" "When was your last menstrual period?"       na  Protocols used: DIZZINESS [facility]

## 2019-09-02 DIAGNOSIS — M14672 Charcot's joint, left ankle and foot: Secondary | ICD-10-CM | POA: Diagnosis not present

## 2019-09-03 ENCOUNTER — Telehealth: Payer: Self-pay

## 2019-09-04 ENCOUNTER — Ambulatory Visit (INDEPENDENT_AMBULATORY_CARE_PROVIDER_SITE_OTHER): Payer: Medicare Other | Admitting: Pharmacist

## 2019-09-04 DIAGNOSIS — E785 Hyperlipidemia, unspecified: Secondary | ICD-10-CM

## 2019-09-04 DIAGNOSIS — I10 Essential (primary) hypertension: Secondary | ICD-10-CM

## 2019-09-04 DIAGNOSIS — IMO0002 Reserved for concepts with insufficient information to code with codable children: Secondary | ICD-10-CM

## 2019-09-04 DIAGNOSIS — E1149 Type 2 diabetes mellitus with other diabetic neurological complication: Secondary | ICD-10-CM

## 2019-09-04 DIAGNOSIS — E1169 Type 2 diabetes mellitus with other specified complication: Secondary | ICD-10-CM

## 2019-09-04 NOTE — Patient Instructions (Signed)
Thank you allowing the Chronic Care Management Team to be a part of your care! It was a pleasure speaking with you today!     CCM (Chronic Care Management) Team    Noreene Larsson RN, MSN, CCM Nurse Care Coordinator  [phone]   Harlow Asa PharmD  Clinical Pharmacist  [phone]   [name] LCSW Clinical Social Worker [phone]  Visit Information  Goals Addressed            This Visit's Progress   . PharmD - Medication Management       Current Barriers:  . Financial Barriers . Non adherence to prescribed medication regimen  Pharmacist Clinical Goal(s):  [name] Over the next 30 days, patient will work with [name] to address needs related to optimization of medication regimen and improvement of medication adherence  Interventions: . Perform chart review o Note on 4/29 patient called clinic nurse triage line regarding dizziness/dehydration and was advised to go to ED . Follow up with patient and daughter today regarding dizziness/symptoms of dehydration from last week o Denies going to ED last week. Reports has been staying hydrated and has felt better. Reports urinating more frequently now without issue and states urine is clear. . Follow up with patient and daughter regarding medication adherence o Report patient has moved into a Senior apartment, ~1 mile away from daughter's home.  - Reports has a home health aid daily from 9 am-noon, who helps patient with her medications some - Reports daughter continues to take patient to medical appointments o Ms. Helget reports takes her own medicine directly from the pill bottles, rather than using weekly pillbox. Patient admits to sometimes getting confused by having multiple medication bottles to manage o Discuss medication adherence tools, including weekly pillbox (as daughter previously prepared for patient) and pill packaging - Patient expresses interest in using pill packaging pharmacy. Reports [facility] is close to her home and interested in using this pharmacy . Encourage patient and daughter to follow up with [facility] regarding pill packaging service. Provide phone number to patient and daughter . Follow up regarding patient's CBG monitoring and control o Reports patient taking: - Metformin 1000 mg twice daily - Lantus 20 units daily  - Humalog according to sliding scale as provided by Endocrinology o Daughter reports continuing to follow up with Endocrinology regularly regarding CBG results and dosing of Lantus and Humalog.  o Daughter reports sliding scale Humalog adjusted since last appointment - Encourage to continue to follow closely with provider and regarding CBGs outside of established parameters o Daughter reports that she looks over patient's blood sugar log regularly, but not with patient now to review  Patient Self Care Activities:  . Self administers medications as prescribed o Using weekly pillbox and calendar as adherence tools . Attends all scheduled provider appointments o Next appointment with Podiatry on 5/10 o Next appointment with Oncology on 5/11 o Appointment with [facility] on 6/1 o Next appointment with Endocrinology on 6/2 . Calls pharmacy for medication refills . Calls provider office for new concerns or questions . Patient to check blood sugar as directed by Endocrinologist and keep log  Please see past updates related to this goal by clicking on the "Past Updates" button in the selected goal         Patient verbalizes understanding of instructions provided today.   Telephone follow up appointment with care management team member scheduled for: 5/24 at 2 pm  Harlow Asa, PharmD, BCACP Clinical  Pharmacist [place] [employer] [phone]

## 2019-09-04 NOTE — Chronic Care Management (AMB) (Signed)
Chronic Care Management   Follow Up Note   09/04/2019 Name: [name] MRN: [id number] DOB: 08-11-1965  Referred by: [name], DO Reason for referral : Chronic Care Management (Patient Phone Call)   [name] is a 54 y.o. year old female who is a primary care patient of [name], DO. The CCM team was consulted for assistance with chronic disease management and care coordination needs.  [name] has a past medical history including but not limited to type 2 diabetes, adenocarcinoma of sigmoid colon, hypertension and hyperlipidemia.  I reached out to [name] by phone today. Also speak with patient's daughter, [name], as requested by patient.  Review of patient status, including review of consultants reports, relevant laboratory and other test results, and collaboration with appropriate care team members and the patient's provider was performed as part of comprehensive patient evaluation and provision of chronic care management services.     Outpatient Encounter Medications as of 09/04/2019  Medication Sig Note  . Insulin Glargine (LANTUS SOLOSTAR) 100 UNIT/ML Solostar Pen Inject 60 Units into the skin daily before breakfast. (Patient taking differently: Inject 20 Units into the skin daily before breakfast. )   . acetaminophen (TYLENOL) 325 MG tablet Take 2 tablets (650 mg total) by mouth every 6 (six) hours as needed for mild pain.   . ARIPiprazole (ABILIFY) 10 MG tablet Take 10 mg by mouth at bedtime.    [name] ascorbic acid (VITAMIN C) 500 MG tablet Take 500 mg by mouth daily.   [name] aspirin EC 81 MG tablet Take 81 mg by mouth daily.    [name] atorvastatin (LIPITOR) 40 MG tablet Take 1 tablet (40 mg total) by mouth daily.   [name] buPROPion (WELLBUTRIN XL) 300 MG 24 hr tablet Take 300 mg by mouth daily.   . colestipol (COLESTID) 1 g tablet TAKE 2 TABLETS(2 GRAMS) BY MOUTH TWICE DAILY FOR DIARRHEA   . Ferrous Sulfate 27 MG TABS Take 1  tablet by mouth 2 (two) times daily with a meal.    . furosemide (LASIX) 40 MG tablet Take 1 tablet (40 mg total) by mouth daily.   [name] gabapentin (NEURONTIN) 300 MG capsule Take 1 capsule (300 mg total) by mouth at bedtime.   . hydrochlorothiazide (HYDRODIURIL) 12.5 MG tablet Take 2 tablets (25 mg total) by mouth daily.   . insulin lispro (HUMALOG) 100 UNIT/ML KwikPen 12 units before meals plus sliding scale - blood sugar 150-200 add 2 units, blood sugar 201-250 add 4 units, blood sugar 251-300 add 6 units, blood sugar 301-350 add 8 units, blood sugar 351-400 add 10 units, max 66 units daily   . Lancets (ONETOUCH ULTRASOFT) lancets Use to check blood sugar up to 3 x daily   . metFORMIN (GLUCOPHAGE) 1000 MG tablet Take 1 tablet (1,000 mg total) by mouth 2 (two) times daily with a meal.   . metoprolol succinate (TOPROL-XL) 50 MG 24 hr tablet Take 1 tablet (50 mg total) by mouth daily. Take with or immediately following a meal.   . ondansetron (ZOFRAN) 4 MG tablet Take 1 tablet (4 mg total) by mouth every 6 (six) hours. (Patient taking differently: Take 4 mg by mouth every 8 (eight) hours as needed for nausea or vomiting. )   . ONETOUCH VERIO test strip Check blood sugar up to 5 x daily as needed   . pantoprazole (PROTONIX) 40 MG tablet Take 1 tablet (40 mg total) by mouth 2 (two) times daily before a  meal.   . traZODone (DESYREL) 50 MG tablet Take 1 tablet (50 mg total) by mouth at bedtime as needed for sleep. 07/17/2019: Have not started   . vitamin B-12 1000 MCG tablet Take 1 tablet (1,000 mcg total) by mouth daily.    No facility-administered encounter medications on file as of 09/04/2019.    Goals Addressed            This Visit's Progress   . PharmD - Medication Management       Current Barriers:  . Financial Barriers . Non adherence to prescribed medication regimen  Pharmacist Clinical Goal(s):  [name] Over the next 30 days, patient will work with [name] to address needs related to  optimization of medication regimen and improvement of medication adherence  Interventions: . Perform chart review o Note on 4/29 patient called clinic nurse triage line regarding dizziness/dehydration and was advised to go to ED . Follow up with patient and daughter today regarding dizziness/symptoms of dehydration from last week o Denies going to ED last week. Reports has been staying hydrated and has felt better. Reports urinating more frequently now without issue and states urine is clear. . Follow up with patient and daughter regarding medication adherence o Report patient has moved into a Senior apartment, ~1 mile away from daughter's home.  - Reports has a home health aid daily from 9 am-noon, who helps patient with her medications some - Reports daughter continues to take patient to medical appointments o [name] reports takes her own medicine directly from the pill bottles, rather than using weekly pillbox. Patient admits to sometimes getting confused by having multiple medication bottles to manage o Discuss medication adherence tools, including weekly pillbox (as daughter previously prepared for patient) and pill packaging - Patient expresses interest in using pill packaging pharmacy. Reports [facility] is close to her home and interested in using this pharmacy . Encourage patient and daughter to follow up with [facility] regarding pill packaging service. Provide phone number to patient and daughter . Follow up regarding patient's CBG monitoring and control o Reports patient taking: - Metformin 1000 mg twice daily - Lantus 20 units daily  - Humalog according to sliding scale as provided by Endocrinology o Daughter reports continuing to follow up with Endocrinology regularly regarding CBG results and dosing of Lantus and Humalog.  o Daughter reports sliding scale Humalog adjusted since last appointment - Encourage to continue to follow closely with provider and  regarding CBGs outside of established parameters o Daughter reports that she looks over patient's blood sugar log regularly, but not with patient now to review  Patient Self Care Activities:  . Self administers medications as prescribed o Using weekly pillbox and calendar as adherence tools . Attends all scheduled provider appointments o Next appointment with Podiatry on 5/10 o Next appointment with Oncology on 5/11 o Appointment with [facility] on 6/1 o Next appointment with Endocrinology on 6/2 . Calls pharmacy for medication refills . Calls provider office for new concerns or questions . Patient to check blood sugar as directed by Endocrinologist and keep log  Please see past updates related to this goal by clicking on the "Past Updates" button in the selected goal         Plan  Telephone follow up appointment with care management team member scheduled for: 5/24 at 2 pm  Harlow Asa, PharmD, [place] [phone]

## 2019-09-06 NOTE — Progress Notes (Signed)
[facility]  Telephone:([phone]) [phone] Fax:(336) [phone]  ID: [name] OB: 08/13/1965  MR#: [id number]  UUV#:[id number]  Patient Care Team: [name], DO as PCP - General (Family Medicine) Floria Raveling, MD as Referring Physician (Psychiatry) [place]alla, Virl Diamond, [facility] as Pharmacist (Pharmacist) Edrick Kins, MD as Rounding Team (Internal Medicine) Vanita Ingles, RN as Case Manager ([place]) Greg Cutter, LCSW as Social Worker (Licensed Clinical Social Worker) Grayland Ormond, Kathlene November, MD as Consulting Physician (Oncology)  CHIEF COMPLAINT: 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  INTERVAL HISTORY: Patient returns to clinic today for repeat laboratory work and further evaluation.  She continues to have chronic weakness and fatigue, but this is improving.  Over the past 24 to 48 hours she has noticed an increase in diarrhea.  She has no neurologic complaints.  She denies any recent fevers or illnesses. She has no chest pain, shortness of breath, cough, or hemoptysis.  She denies any nausea, vomiting, or constipation.  She has no melena or hematochezia.  She has no urinary complaints.  Patient offers no further specific complaints today.  REVIEW OF SYSTEMS:   Review of Systems  Constitutional: Positive for malaise/fatigue. Negative for fever and weight loss.  Respiratory: Negative.  Negative for cough, hemoptysis and shortness of breath.   Cardiovascular: Negative.  Negative for chest pain and leg swelling.  Gastrointestinal: Positive for diarrhea. Negative for abdominal pain, blood in stool, melena and nausea.  Genitourinary: Negative.  Negative for dysuria.  Musculoskeletal: Negative.  Negative for back pain.  Skin: Negative.  Negative for rash.  Neurological: Positive for weakness. Negative for dizziness, sensory change, focal weakness and headaches.  Psychiatric/Behavioral: Negative.  The patient is  not nervous/anxious.     As per HPI. Otherwise, a complete review of systems is negative.  PAST MEDICAL HISTORY: Past Medical History:  Diagnosis Date  . Depression   . Diabetes mellitus without complication ([place])   . Hyperlipidemia   . Hypertension   . Neuromuscular disorder ([place])   . Schizo affective schizophrenia ([place])    abilify and pazil    PAST SURGICAL HISTORY: Past Surgical History:  Procedure Laterality Date  . CHOLECYSTECTOMY    . COLONOSCOPY WITH PROPOFOL N/A 05/26/2019   Procedure: COLONOSCOPY WITH PROPOFOL;  Surgeon: Toledo, Benay Pike, MD;  Location: [facility] ENDOSCOPY;  Service: Gastroenterology;  Laterality: N/A;  . ESOPHAGOGASTRODUODENOSCOPY (EGD) WITH PROPOFOL N/A 05/26/2019   Procedure: ESOPHAGOGASTRODUODENOSCOPY (EGD) WITH PROPOFOL;  Surgeon: Toledo, Benay Pike, MD;  Location: [facility] ENDOSCOPY;  Service: Gastroenterology;  Laterality: N/A;  . IRRIGATION AND DEBRIDEMENT FOOT Right 02/26/2016   Procedure: RIGHT 2ND TOE DEBRIDEMENT;  Surgeon: Samara Deist, DPM;  Location: [facility] ORS;  Service: Podiatry;  Laterality: Right;  . IRRIGATION AND DEBRIDEMENT FOOT Left 12/13/2018   Procedure: IRRIGATION AND DEBRIDEMENT FOOT;  Surgeon: Caroline More, DPM;  Location: [facility] ORS;  Service: Podiatry;  Laterality: Left;    FAMILY HISTORY: Family History  Problem Relation Age of Onset  . Breast cancer Cousin        maternal cousin  . Hypertension Mother   . Hyperthyroidism Mother   . Dementia Mother   . Prostate cancer Father   . Diabetes Maternal Grandmother   . Hypertension Maternal Grandmother   . Cancer Maternal Grandmother        unknown type  . Diabetes Maternal Grandfather   . Hypertension Maternal Grandfather   . Diabetes Paternal Grandmother   . Hypertension Paternal Grandmother   .  Diabetes Paternal Grandfather   . Hypertension Paternal Grandfather     ADVANCED DIRECTIVES (Y/N):  N  HEALTH MAINTENANCE: Social History   Tobacco Use  . Smoking status: Former  Smoker    Packs/day: 3.00    Years: 30.00    Pack years: 90.00    Types: Cigarettes    Quit date: 05/02/2009    Years since quitting: 10.3  . Smokeless tobacco: Never Used  . Tobacco comment: Pt reported  quitting in 2011  Substance Use Topics  . Alcohol use: No  . Drug use: No     Colonoscopy:  PAP:  Bone density:  Lipid panel:  No Known Allergies  Current Outpatient Medications  Medication Sig Dispense Refill  . acetaminophen (TYLENOL) 325 MG tablet Take 2 tablets (650 mg total) by mouth every 6 (six) hours as needed for mild pain.    . ARIPiprazole (ABILIFY) 10 MG tablet Take 10 mg by mouth at bedtime.     [name] ascorbic acid (VITAMIN C) 500 MG tablet Take 500 mg by mouth daily.    [name] aspirin EC 81 MG tablet Take 81 mg by mouth daily.     [name] atorvastatin (LIPITOR) 40 MG tablet Take 1 tablet (40 mg total) by mouth daily. 90 tablet 3  . buPROPion (WELLBUTRIN XL) 300 MG 24 hr tablet Take 300 mg by mouth daily.    . colestipol (COLESTID) 1 g tablet TAKE 2 TABLETS(2 GRAMS) BY MOUTH TWICE DAILY FOR DIARRHEA 120 tablet 0  . Ferrous Sulfate 27 MG TABS Take 1 tablet by mouth 2 (two) times daily with a meal.     . furosemide (LASIX) 40 MG tablet Take 1 tablet (40 mg total) by mouth daily. 90 tablet 1  . gabapentin (NEURONTIN) 300 MG capsule Take 1 capsule (300 mg total) by mouth at bedtime. 90 capsule 1  . hydrochlorothiazide (HYDRODIURIL) 12.5 MG tablet Take 2 tablets (25 mg total) by mouth daily. 90 tablet 1  . Insulin Glargine (LANTUS SOLOSTAR) 100 UNIT/ML Solostar Pen Inject 60 Units into the skin daily before breakfast. (Patient taking differently: Inject 20 Units into the skin daily before breakfast. )    . insulin lispro (HUMALOG) 100 UNIT/ML KwikPen 12 units before meals plus sliding scale - blood sugar 150-200 add 2 units, blood sugar 201-250 add 4 units, blood sugar 251-300 add 6 units, blood sugar 301-350 add 8 units, blood sugar 351-400 add 10 units, max 66 units daily    . Lancets  (ONETOUCH ULTRASOFT) lancets Use to check blood sugar up to 3 x daily 100 each 12  . metFORMIN (GLUCOPHAGE) 1000 MG tablet Take 1 tablet (1,000 mg total) by mouth 2 (two) times daily with a meal. 180 tablet 3  . metoprolol succinate (TOPROL-XL) 50 MG 24 hr tablet Take 1 tablet (50 mg total) by mouth daily. Take with or immediately following a meal. 90 tablet 0  . ondansetron (ZOFRAN) 4 MG tablet Take 1 tablet (4 mg total) by mouth every 6 (six) hours. (Patient taking differently: Take 4 mg by mouth every 8 (eight) hours as needed for nausea or vomiting. ) 12 tablet 0  . ONETOUCH VERIO test strip Check blood sugar up to 5 x daily as needed 450 each 3  . pantoprazole (PROTONIX) 40 MG tablet Take 1 tablet (40 mg total) by mouth 2 (two) times daily before a meal. 180 tablet 1  . traZODone (DESYREL) 50 MG tablet Take 1 tablet (50 mg total) by mouth at bedtime as  needed for sleep. 30 tablet 2  . vitamin B-12 1000 MCG tablet Take 1 tablet (1,000 mcg total) by mouth daily.     No current facility-administered medications for this visit.    OBJECTIVE: Vitals:   09/10/19 1113  BP: 115/66  Pulse: 83  Resp: 20  Temp: (!) 95 F (35 C)  SpO2: 100%     Body mass index is 35.24 kg/m.    ECOG FS:1 - Symptomatic but completely ambulatory  General: Well-developed, well-nourished, no acute distress. Eyes: Pink conjunctiva, anicteric sclera. HEENT: Normocephalic, moist mucous membranes. Lungs: No audible wheezing or coughing. Heart: Regular rate and rhythm. Abdomen: Soft, nontender, no obvious distention. Musculoskeletal: No edema, cyanosis, or clubbing. Neuro: Alert, answering all questions appropriately. Cranial nerves grossly intact. Skin: No rashes or petechiae noted. Psych: Normal affect.   LAB RESULTS:  Lab Results  Component Value Date   NA 133 (L) 09/10/2019   K 4.1 09/10/2019   CL 99 09/10/2019   CO2 23 09/10/2019   GLUCOSE 189 (H) 09/10/2019   BUN 24 (H) 09/10/2019   CREATININE  1.33 (H) 09/10/2019   CALCIUM 9.5 09/10/2019   PROT 7.1 07/25/2019   ALBUMIN 3.7 07/25/2019   AST 14 (L) 07/25/2019   ALT 19 07/25/2019   ALKPHOS 112 07/25/2019   BILITOT 0.3 07/25/2019   GFRNONAA 45 (L) 09/10/2019   GFRAA 52 (L) 09/10/2019    Lab Results  Component Value Date   WBC 9.3 09/10/2019   NEUTROABS 5.4 09/10/2019   HGB 9.4 (L) 09/10/2019   HCT 29.6 (L) 09/10/2019   MCV 79.4 (L) 09/10/2019   PLT 234 09/10/2019   Lab Results  Component Value Date   IRON 42 09/10/2019   TIBC 321 09/10/2019   IRONPCTSAT 13 09/10/2019   Lab Results  Component Value Date   FERRITIN 74 09/10/2019     STUDIES: No results found.  ASSESSMENT: Stage I adenocarcinoma of the sigmoid colon.  PLAN:    1. 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 Final pathology and imaging reviewed independently.  Patient had a T2, N0, M0 lesion which does not require adjuvant chemotherapy.  No further interventions are needed.  Her most recent CEA on July 25, 2019 was reported at 1.4.  Continue follow-up with surgery as indicated.  Patient will require repeat colonoscopy in 6 to 12 months from partial colectomy.  2.  Iron deficiency anemia: Patient's hemoglobin continues to slowly trend up and is 9.4.  Although her ferritin is 74, her iron saturation continues to be at the low level of normal at 13%.  Because of this, patient will return to clinic later this week to receive 1 infusion of 510 mg IV Feraheme.  No further intervention is needed.  Return to clinic in 2 months with repeat laboratory work and further evaluation.   3.  Acute on chronic renal insufficiency: Patient's creatinine has trended up slightly, possibly secondary to recent bout of diarrhea.  Encourage increased fluid intake. 4.  Hyperkalemia: Resolved. 5.  Diarrhea: Recommended increasing fluids and OTC Imodium.  Patient expressed understanding and was in agreement with this plan.  She also understands that She can call clinic at any time with any questions, concerns, or complaints.   Cancer Staging Adenocarcinoma of sigmoid colon [facility]) Staging form: Colon and Rectum, AJCC 8th Edition - Clinical stage from 06/07/2019: Stage I (cT2, cN0, cM0) - Signed by Lloyd Huger, MD on 06/07/2019   Christia Reading  Bridget Hartshorn, MD   09/10/2019 12:48 PM

## 2019-09-10 ENCOUNTER — Inpatient Hospital Stay: Payer: Medicare Other

## 2019-09-10 ENCOUNTER — Encounter: Payer: Self-pay | Admitting: Oncology

## 2019-09-10 ENCOUNTER — Other Ambulatory Visit: Payer: Self-pay

## 2019-09-10 ENCOUNTER — Inpatient Hospital Stay: Payer: Medicare Other | Attending: Oncology

## 2019-09-10 ENCOUNTER — Inpatient Hospital Stay (HOSPITAL_BASED_OUTPATIENT_CLINIC_OR_DEPARTMENT_OTHER): Payer: Medicare Other | Admitting: Oncology

## 2019-09-10 VITALS — BP 115/66 | HR 83 | Temp 95.0°F | Resp 20 | Wt 249.1 lb

## 2019-09-10 DIAGNOSIS — R5381 Other malaise: Secondary | ICD-10-CM | POA: Diagnosis not present

## 2019-09-10 DIAGNOSIS — D509 Iron deficiency anemia, unspecified: Secondary | ICD-10-CM | POA: Diagnosis not present

## 2019-09-10 DIAGNOSIS — R197 Diarrhea, unspecified: Secondary | ICD-10-CM | POA: Diagnosis not present

## 2019-09-10 DIAGNOSIS — C187 Malignant neoplasm of sigmoid colon: Secondary | ICD-10-CM | POA: Diagnosis not present

## 2019-09-10 DIAGNOSIS — F329 Major depressive disorder, single episode, unspecified: Secondary | ICD-10-CM | POA: Diagnosis not present

## 2019-09-10 DIAGNOSIS — R5382 Chronic fatigue, unspecified: Secondary | ICD-10-CM | POA: Insufficient documentation

## 2019-09-10 DIAGNOSIS — Z7982 Long term (current) use of aspirin: Secondary | ICD-10-CM | POA: Diagnosis not present

## 2019-09-10 DIAGNOSIS — Z87891 Personal history of nicotine dependence: Secondary | ICD-10-CM | POA: Insufficient documentation

## 2019-09-10 DIAGNOSIS — Z794 Long term (current) use of insulin: Secondary | ICD-10-CM

## 2019-09-10 DIAGNOSIS — Z803 Family history of malignant neoplasm of breast: Secondary | ICD-10-CM | POA: Insufficient documentation

## 2019-09-10 DIAGNOSIS — I129 Hypertensive chronic kidney disease with stage 1 through stage 4 chronic kidney disease, or unspecified chronic kidney disease: Secondary | ICD-10-CM | POA: Diagnosis not present

## 2019-09-10 DIAGNOSIS — F259 Schizoaffective disorder, unspecified: Secondary | ICD-10-CM | POA: Diagnosis not present

## 2019-09-10 DIAGNOSIS — E1165 Type 2 diabetes mellitus with hyperglycemia: Secondary | ICD-10-CM

## 2019-09-10 DIAGNOSIS — E785 Hyperlipidemia, unspecified: Secondary | ICD-10-CM | POA: Insufficient documentation

## 2019-09-10 DIAGNOSIS — R531 Weakness: Secondary | ICD-10-CM | POA: Diagnosis not present

## 2019-09-10 DIAGNOSIS — E1122 Type 2 diabetes mellitus with diabetic chronic kidney disease: Secondary | ICD-10-CM | POA: Insufficient documentation

## 2019-09-10 DIAGNOSIS — Z79899 Other long term (current) drug therapy: Secondary | ICD-10-CM | POA: Insufficient documentation

## 2019-09-10 LAB — CBC WITH DIFFERENTIAL/PLATELET
Abs Immature Granulocytes: 0.03 10*3/uL (ref 0.00–0.07)
Basophils Absolute: 0.1 10*3/uL (ref 0.0–0.1)
Basophils Relative: 1 %
Eosinophils Absolute: 0.5 10*3/uL (ref 0.0–0.5)
Eosinophils Relative: 5 %
HCT: 29.6 % — ABNORMAL LOW (ref 36.0–46.0)
Hemoglobin: 9.4 g/dL — ABNORMAL LOW (ref 12.0–15.0)
Immature Granulocytes: 0 %
Lymphocytes Relative: 29 %
Lymphs Abs: 2.7 10*3/uL (ref 0.7–4.0)
MCH: 25.2 pg — ABNORMAL LOW (ref 26.0–34.0)
MCHC: 31.8 g/dL (ref 30.0–36.0)
MCV: 79.4 fL — ABNORMAL LOW (ref 80.0–100.0)
Monocytes Absolute: 0.6 10*3/uL (ref 0.1–1.0)
Monocytes Relative: 6 %
Neutro Abs: 5.4 10*3/uL (ref 1.7–7.7)
Neutrophils Relative %: 59 %
Platelets: 234 10*3/uL (ref 150–400)
RBC: 3.73 MIL/uL — ABNORMAL LOW (ref 3.87–5.11)
RDW: 16.5 % — ABNORMAL HIGH (ref 11.5–15.5)
WBC: 9.3 10*3/uL (ref 4.0–10.5)
nRBC: 0 % (ref 0.0–0.2)

## 2019-09-10 LAB — BASIC METABOLIC PANEL
Anion gap: 11 (ref 5–15)
BUN: 24 mg/dL — ABNORMAL HIGH (ref 6–20)
CO2: 23 mmol/L (ref 22–32)
Calcium: 9.5 mg/dL (ref 8.9–10.3)
Chloride: 99 mmol/L (ref 98–111)
Creatinine, Ser: 1.33 mg/dL — ABNORMAL HIGH (ref 0.44–1.00)
GFR calc Af Amer: 52 mL/min — ABNORMAL LOW (ref >60)
GFR calc non Af Amer: 45 mL/min — ABNORMAL LOW (ref >60)
Glucose, Bld: 189 mg/dL — ABNORMAL HIGH (ref 70–99)
Potassium: 4.1 mmol/L (ref 3.5–5.1)
Sodium: 133 mmol/L — ABNORMAL LOW (ref 135–145)

## 2019-09-10 LAB — IRON AND TIBC
Iron: 42 ug/dL (ref 28–170)
Saturation Ratios: 13 % (ref 10.4–31.8)
TIBC: 321 ug/dL (ref 250–450)
UIBC: 279 ug/dL

## 2019-09-10 LAB — FERRITIN: Ferritin: 74 ng/mL (ref 11–307)

## 2019-09-10 NOTE — Progress Notes (Signed)
Patient here today for follow up. She reports some frequent diarrhea. No pain or other concerns at this time.

## 2019-09-10 NOTE — Telephone Encounter (Signed)
Copied from [place] [id number]. Topic: General - Inquiry >> Sep 10, 2019  2:05 PM Greggory Keen D wrote: Reason for CRM: Pt called saying her glucose test meter is broke and she wants to know if Dr. Raliegh Ip can order her a whole new kit.  [place]  CB# [phone]

## 2019-09-10 NOTE — Telephone Encounter (Signed)
Looks like the [employer] was selected. Is this the one she prefers?  She previously had a OneTouch Verio meter.  I can re order the OneTouch or switch to the Freestyle.  Let me know which one she prefers or is covered.  [name], [facility] 09/10/2019, 5:32 PM

## 2019-09-11 ENCOUNTER — Telehealth: Payer: Self-pay | Admitting: Oncology

## 2019-09-11 LAB — CEA: CEA: 1.1 ng/mL (ref 0.0–4.7)

## 2019-09-11 MED ORDER — ONETOUCH VERIO W/DEVICE KIT: PACK | 0 refills | Status: DC

## 2019-09-11 NOTE — Telephone Encounter (Signed)
Patient phoned on this date and stated that she would not be able to attend her infusion appt on 09-16-19 and would need to reschedule for either 09-18-19 or 09-19-19. Patient confirmed that she did not receive an infusion on 09-10-19. Infusion has been rescheduled to 09-19-19.

## 2019-09-16 ENCOUNTER — Inpatient Hospital Stay: Payer: Medicare Other

## 2019-09-17 ENCOUNTER — Emergency Department (HOSPITAL_COMMUNITY): Payer: Medicare Other

## 2019-09-17 ENCOUNTER — Encounter (HOSPITAL_COMMUNITY): Payer: Self-pay

## 2019-09-17 ENCOUNTER — Other Ambulatory Visit: Payer: Self-pay

## 2019-09-17 ENCOUNTER — Emergency Department (HOSPITAL_COMMUNITY)
Admission: EM | Admit: 2019-09-17 | Discharge: 2019-09-17 | Disposition: A | Discharge status: Home / Self Care | Payer: Medicare Other | Attending: Emergency Medicine | Admitting: Emergency Medicine

## 2019-09-17 DIAGNOSIS — I10 Essential (primary) hypertension: Secondary | ICD-10-CM | POA: Diagnosis not present

## 2019-09-17 DIAGNOSIS — Z87891 Personal history of nicotine dependence: Secondary | ICD-10-CM | POA: Insufficient documentation

## 2019-09-17 DIAGNOSIS — Z794 Long term (current) use of insulin: Secondary | ICD-10-CM | POA: Insufficient documentation

## 2019-09-17 DIAGNOSIS — E11610 Type 2 diabetes mellitus with diabetic neuropathic arthropathy: Secondary | ICD-10-CM

## 2019-09-17 DIAGNOSIS — Z79899 Other long term (current) drug therapy: Secondary | ICD-10-CM | POA: Diagnosis not present

## 2019-09-17 DIAGNOSIS — E11618 Type 2 diabetes mellitus with other diabetic arthropathy: Secondary | ICD-10-CM | POA: Diagnosis not present

## 2019-09-17 DIAGNOSIS — Z7982 Long term (current) use of aspirin: Secondary | ICD-10-CM | POA: Insufficient documentation

## 2019-09-17 DIAGNOSIS — M7989 Other specified soft tissue disorders: Secondary | ICD-10-CM | POA: Diagnosis not present

## 2019-09-17 DIAGNOSIS — M79672 Pain in left foot: Secondary | ICD-10-CM | POA: Diagnosis present

## 2019-09-17 LAB — COMPREHENSIVE METABOLIC PANEL
ALT: 21 U/L (ref 0–44)
AST: 20 U/L (ref 15–41)
Albumin: 3.9 g/dL (ref 3.5–5.0)
Alkaline Phosphatase: 128 U/L — ABNORMAL HIGH (ref 38–126)
Anion gap: 13 (ref 5–15)
BUN: 29 mg/dL — ABNORMAL HIGH (ref 6–20)
CO2: 24 mmol/L (ref 22–32)
Calcium: 9.7 mg/dL (ref 8.9–10.3)
Chloride: 98 mmol/L (ref 98–111)
Creatinine, Ser: 1.48 mg/dL — ABNORMAL HIGH (ref 0.44–1.00)
GFR calc Af Amer: 46 mL/min — ABNORMAL LOW (ref >60)
GFR calc non Af Amer: 39 mL/min — ABNORMAL LOW (ref >60)
Glucose, Bld: 188 mg/dL — ABNORMAL HIGH (ref 70–99)
Potassium: 4.2 mmol/L (ref 3.5–5.1)
Sodium: 135 mmol/L (ref 135–145)
Total Bilirubin: 0.5 mg/dL (ref 0.3–1.2)
Total Protein: 7.2 g/dL (ref 6.5–8.1)

## 2019-09-17 LAB — CBC WITH DIFFERENTIAL/PLATELET
Abs Immature Granulocytes: 0.03 10*3/uL (ref 0.00–0.07)
Basophils Absolute: 0.1 10*3/uL (ref 0.0–0.1)
Basophils Relative: 1 %
Eosinophils Absolute: 0.7 10*3/uL — ABNORMAL HIGH (ref 0.0–0.5)
Eosinophils Relative: 8 %
HCT: 29.4 % — ABNORMAL LOW (ref 36.0–46.0)
Hemoglobin: 9.2 g/dL — ABNORMAL LOW (ref 12.0–15.0)
Immature Granulocytes: 0 %
Lymphocytes Relative: 27 %
Lymphs Abs: 2.6 10*3/uL (ref 0.7–4.0)
MCH: 25.9 pg — ABNORMAL LOW (ref 26.0–34.0)
MCHC: 31.3 g/dL (ref 30.0–36.0)
MCV: 82.8 fL (ref 80.0–100.0)
Monocytes Absolute: 0.6 10*3/uL (ref 0.1–1.0)
Monocytes Relative: 6 %
Neutro Abs: 5.4 10*3/uL (ref 1.7–7.7)
Neutrophils Relative %: 58 %
Platelets: 261 10*3/uL (ref 150–400)
RBC: 3.55 MIL/uL — ABNORMAL LOW (ref 3.87–5.11)
RDW: 16.4 % — ABNORMAL HIGH (ref 11.5–15.5)
WBC: 9.4 10*3/uL (ref 4.0–10.5)
nRBC: 0 % (ref 0.0–0.2)

## 2019-09-17 NOTE — ED Triage Notes (Signed)
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  Reports she's had a knot on the inner side of her left foot for the past week.  Denies pain.  SLight redness noted to area.

## 2019-09-17 NOTE — Discharge Instructions (Addendum)
Follow-up with your podiatrist 

## 2019-09-17 NOTE — ED Provider Notes (Signed)
[name] Provider Note   CSN: [id number] Arrival date & time: 09/17/19  1013     History Chief Complaint  Patient presents with  . Foot Pain    [name] is a 54 y.o. female.  Patient complains of swelling to her left foot.  Patient has  Charcot  foot  The history is provided by the patient. No language interpreter was used.  Foot Pain This is a recurrent problem. The current episode started more than 1 week ago. The problem occurs constantly. The problem has not changed since onset.Pertinent negatives include no chest pain, no abdominal pain and no headaches. Nothing aggravates the symptoms.       Past Medical History:  Diagnosis Date  . Depression   . Diabetes mellitus without complication ([place])   . Hyperlipidemia   . Hypertension   . Neuromuscular disorder ([place])   . Schizo affective schizophrenia ([place])    abilify and pazil    Patient Active Problem List   Diagnosis Date Noted  . Heart palpitations 08/19/2019  . Iron deficiency anemia 07/25/2019  . Diabetes mellitus with polyneuropathy ([place]) 07/17/2019  . S/P partial colectomy 07/17/2019  . Acute diarrhea 07/17/2019  . Acute kidney injury ([place]) 07/17/2019  . 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 Anemia   . Hyponatremia 05/23/2019  . Neuropathic foot ulcer, left, with fat layer exposed ([place]) 02/05/2019  . Hyperkalemia 02/05/2019  . Persistent microalbuminuria associated with type 2 diabetes mellitus ([place]) 05/16/2018  . Type 2 diabetes mellitus with hyperglycemia, with long-term current use of insulin ([place]) 05/16/2018  . Morbid obesity ([place]) 05/10/2017  . Long-term use of high-risk medication 05/10/2017  . Hyperlipidemia associated with type 2 diabetes mellitus ([place]) 06/27/2016  . Hammer toe of second toe of right foot 04/20/2016  . Essential hypertension 06/29/2015  . Schizoaffective disorder ([place]) 06/29/2015  . DM (diabetes mellitus), type 2, uncontrolled  w/neurologic complication ([place]) 81/44/8185  . Polyneuropathy 04/02/2014    Past Surgical History:  Procedure Laterality Date  . CHOLECYSTECTOMY    . COLONOSCOPY WITH PROPOFOL N/A 05/26/2019   Procedure: COLONOSCOPY WITH PROPOFOL;  Surgeon: Toledo, Benay Pike, MD;  Location: [facility] ENDOSCOPY;  Service: Gastroenterology;  Laterality: N/A;  . ESOPHAGOGASTRODUODENOSCOPY (EGD) WITH PROPOFOL N/A 05/26/2019   Procedure: ESOPHAGOGASTRODUODENOSCOPY (EGD) WITH PROPOFOL;  Surgeon: Toledo, Benay Pike, MD;  Location: [facility] ENDOSCOPY;  Service: Gastroenterology;  Laterality: N/A;  . IRRIGATION AND DEBRIDEMENT FOOT Right 02/26/2016   Procedure: RIGHT 2ND TOE DEBRIDEMENT;  Surgeon: Samara Deist, DPM;  Location: [facility] ORS;  Service: Podiatry;  Laterality: Right;  . IRRIGATION AND DEBRIDEMENT FOOT Left 12/13/2018   Procedure: IRRIGATION AND DEBRIDEMENT FOOT;  Surgeon: Caroline More, DPM;  Location: [facility] ORS;  Service: Podiatry;  Laterality: Left;     OB History    Gravida  2   Para  2   Term  2   Preterm      AB      Living  2     SAB      TAB      Ectopic      Multiple      Live Births              Family History  Problem Relation Age of Onset  . Breast cancer Cousin        maternal cousin  . Hypertension Mother   . Hyperthyroidism Mother   . Dementia Mother   . Prostate cancer Father   .  Diabetes Maternal Grandmother   . Hypertension Maternal Grandmother   . Cancer Maternal Grandmother        unknown type  . Diabetes Maternal Grandfather   . Hypertension Maternal Grandfather   . Diabetes Paternal Grandmother   . Hypertension Paternal Grandmother   . Diabetes Paternal Grandfather   . Hypertension Paternal Grandfather     Social History   Tobacco Use  . Smoking status: Former Smoker    Packs/day: 3.00    Years: 30.00    Pack years: 90.00    Types: Cigarettes    Quit date: 05/02/2009    Years since quitting: 10.3  . Smokeless tobacco: Never Used  . Tobacco comment: Pt  reported  quitting in 2011  Substance Use Topics  . Alcohol use: No  . Drug use: No    Home Medications Prior to Admission medications   Medication Sig Start Date End Date Taking? Authorizing Provider  acetaminophen (TYLENOL) 325 MG tablet Take 2 tablets (650 mg total) by mouth every 6 (six) hours as needed for mild pain. 06/04/19  Yes Nicole Kindred A, DO  ARIPiprazole (ABILIFY) 10 MG tablet Take 10 mg by mouth at bedtime.    Yes [provider]  aspirin EC 81 MG tablet Take 81 mg by mouth daily.    Yes [provider]  atorvastatin (LIPITOR) 40 MG tablet Take 1 tablet (40 mg total) by mouth daily. 02/20/19  Yes Karamalegos, Devonne Doughty, DO  buPROPion (WELLBUTRIN XL) 300 MG 24 hr tablet Take 300 mg by mouth daily. 07/23/19  Yes [provider]  colestipol (COLESTID) 1 g tablet TAKE 2 TABLETS(2 GRAMS) BY MOUTH TWICE DAILY FOR DIARRHEA Patient taking differently: Take 2 g by mouth every other day.  08/29/19  Yes Vanga, Tally Due, MD  Ferrous Sulfate 27 MG TABS Take 1 tablet by mouth daily.    Yes [provider]  furosemide (LASIX) 40 MG tablet Take 1 tablet (40 mg total) by mouth daily. 08/14/19  Yes Karamalegos, Devonne Doughty, DO  gabapentin (NEURONTIN) 300 MG capsule Take 1 capsule (300 mg total) by mouth at bedtime. 08/14/19  Yes Karamalegos, Devonne Doughty, DO  hydrochlorothiazide (HYDRODIURIL) 12.5 MG tablet Take 2 tablets (25 mg total) by mouth daily. 07/15/19  Yes Karamalegos, Devonne Doughty, DO  Insulin Glargine (LANTUS SOLOSTAR) 100 UNIT/ML Solostar Pen Inject 60 Units into the skin daily before breakfast. Patient taking differently: Inject 20 Units into the skin daily before breakfast.  06/04/19  Yes Nicole Kindred A, DO  insulin lispro (HUMALOG) 100 UNIT/ML KwikPen Inject 12 Units into the skin 3 (three) times daily.  07/24/19  Yes [provider]  Loperamide HCl (IMODIUM PO) Take 1 capsule by mouth daily as needed (diarrhea).   Yes [provider]  metFORMIN (GLUCOPHAGE) 1000 MG tablet Take 1 tablet (1,000 mg total) by mouth 2 (two) times daily with a meal. 02/20/19  Yes Karamalegos, Devonne Doughty, DO  metoprolol succinate (TOPROL-XL) 50 MG 24 hr tablet Take 1 tablet (50 mg total) by mouth daily. Take with or immediately following a meal. 08/15/19  Yes Karamalegos, [name] J, DO  ondansetron (ZOFRAN) 4 MG tablet Take 1 tablet (4 mg total) by mouth every 6 (six) hours. Patient taking differently: Take 4 mg by mouth every 8 (eight) hours as needed for nausea or vomiting.  05/16/19  Yes Wurst, [place], PA-C  pantoprazole (PROTONIX) 40 MG tablet Take 1 tablet (40 mg total) by mouth 2 (two) times daily before a meal.  Patient taking differently: Take 40 mg by mouth daily.  08/14/19  Yes Karamalegos, Devonne Doughty, DO  traZODone (DESYREL) 50 MG tablet Take 1 tablet (50 mg total) by mouth at bedtime as needed for sleep. 07/09/19  Yes Karamalegos, Devonne Doughty, DO  vitamin B-12 1000 MCG tablet Take 1 tablet (1,000 mcg total) by mouth daily. 06/05/19  Yes Nicole Kindred A, DO  ascorbic acid (VITAMIN C) 500 MG tablet Take 500 mg by mouth daily.    [provider]  Blood Glucose Monitoring Suppl (ONETOUCH VERIO) w/Device KIT Use to check blood sugar up to 3 x daily Patient not taking: Reported on 09/17/2019 09/11/19   [name], DO  Lancets [facility] ULTRASOFT) lancets Use to check blood sugar up to 3 x daily Patient not taking: Reported on 09/17/2019 03/18/19   [name], DO  [facility] VERIO test strip Check blood sugar up to 5 x daily as needed Patient not taking: Reported on 09/17/2019 04/17/19   [name], DO    Allergies    Patient has no known allergies.  Review of Systems   Review of Systems  Constitutional: Negative for appetite change and fatigue.  HENT: Negative for congestion, ear discharge and sinus pressure.   Eyes: Negative for discharge.  Respiratory: Negative for cough.    Cardiovascular: Negative for chest pain.  Gastrointestinal: Negative for abdominal pain and diarrhea.  Genitourinary: Negative for frequency and hematuria.  Musculoskeletal: Negative for back pain.       Foot deformity  Skin: Negative for rash.  Neurological: Negative for seizures and headaches.  Psychiatric/Behavioral: Negative for hallucinations.    Physical Exam Updated Vital Signs BP 137/78   Pulse 94   Temp 98.1 F (36.7 C) (Oral)   Resp 18   Ht _0  (1.778 m)   Wt 112.9 kg   SpO2 100%   BMI 35.73 kg/m   Physical Exam Vitals and nursing note reviewed.  Constitutional:      Appearance: She is well-developed.  HENT:     Head: Normocephalic.     Nose: Nose normal.  Eyes:     Conjunctiva/sclera: Conjunctivae normal.  Neck:     Trachea: No tracheal deviation.  Cardiovascular:     Rate and Rhythm: Normal rate.     Heart sounds: No murmur.  Pulmonary:     Effort: Pulmonary effort is normal.  Musculoskeletal:     Cervical back: Normal range of motion.     Comments: Deformity to left foot from charcot foot.   Skin:    General: Skin is warm.  Neurological:     Mental Status: She is oriented to person, place, and time.     ED Results / Procedures / Treatments   Labs (all labs ordered are listed, but only abnormal results are displayed) Labs Reviewed  CBC WITH DIFFERENTIAL/PLATELET - Abnormal; Notable for the following components:      Result Value   RBC 3.55 (*)    Hemoglobin 9.2 (*)    HCT 29.4 (*)    MCH 25.9 (*)    RDW 16.4 (*)    Eosinophils Absolute 0.7 (*)    All other components within normal limits  COMPREHENSIVE METABOLIC PANEL - Abnormal; Notable for the following components:   Glucose, Bld 188 (*)    BUN 29 (*)    Creatinine, Ser 1.48 (*)    Alkaline Phosphatase 128 (*)    GFR calc non Af Amer 39 (*)    GFR calc  Af Amer 46 (*)    All other components within normal limits    EKG None  Radiology DG Foot Complete Left  Result Date:  09/17/2019 CLINICAL DATA:  Left foot swelling for the past week. The patient reports a palpable mass in the medial aspect of the foot for the past week. Slight skin redness in that area. EXAM: LEFT FOOT - COMPLETE 3+ VIEW COMPARISON:  05/31/2019 FINDINGS: Mild dorsal soft tissue swelling with significant improvement. Previously noted Charcot changes of the midfoot with malalignment and bony fragmentation, including marked medial dislocation of the navicular. No acute fracture or dislocation seen. Flattening of the normal plantar arch. Large posterior and moderate-sized inferior calcaneal enthesophytes. Atheromatous arterial calcifications. IMPRESSION: 1. Mild dorsal soft tissue swelling with significant improvement. 2. Previously noted Charcot changes of the midfoot with malalignment and bony fragmentation. The malalignment includes marked medial dislocation of the navicular, possibly corresponding to the mass felt by the patient. 3. Pes planus. Electronically Signed   By: Claudie Revering M.D.   On: 09/17/2019 11:10    Procedures Procedures (including critical care time)  Medications Ordered in ED Medications - No data to display  ED Course  I have reviewed the triage vital signs and the nursing notes.  Pertinent labs & imaging results that were available during my care of the patient were reviewed by me and considered in my medical decision making (see chart for details).    MDM Rules/Calculators/A&P                      Pt with worsening deformity of foot from charcot foot.  She will follow up with her podiatrist    This patient presents to the ED for concern of foot deformity, this involves an extensive number of treatment options, and is a complaint that carries with it a high risk of complications and morbidity.  The differential diagnosis includes fractured foot cellulitis worsening Charcot  foot   Lab Tests:  I Ordered, reviewed, and interpreted labs, which included CBC and chemistries  that showed patient to be anemic glucose mildly elevated 188 Medicines ordered:     Imaging Studies ordered:   I ordered imaging studies which included x-ray left foot and  I independently visualized and interpreted imaging which showed worsening Charcot foot  Additional history obtained:   Additional history obtained from record  Previous records obtained and reviewed  Consultations Obtained:     Reevaluation:  After the interventions stated above, I reevaluated the patient and found unchanged  Critical Interventions:  .   Final Clinical Impression(s) / ED Diagnoses Final diagnoses:  Charcot foot due to diabetes mellitus [facility])    Rx / [place] Orders ED Discharge Orders    None       Milton Ferguson, MD 09/17/19 1302

## 2019-09-19 ENCOUNTER — Other Ambulatory Visit: Payer: Self-pay

## 2019-09-19 ENCOUNTER — Inpatient Hospital Stay: Payer: Medicare Other

## 2019-09-19 VITALS — BP 127/85 | HR 83 | Temp 98.0°F | Resp 18

## 2019-09-19 DIAGNOSIS — Z794 Long term (current) use of insulin: Secondary | ICD-10-CM | POA: Diagnosis not present

## 2019-09-19 DIAGNOSIS — R5381 Other malaise: Secondary | ICD-10-CM | POA: Diagnosis not present

## 2019-09-19 DIAGNOSIS — Z87891 Personal history of nicotine dependence: Secondary | ICD-10-CM | POA: Diagnosis not present

## 2019-09-19 DIAGNOSIS — I129 Hypertensive chronic kidney disease with stage 1 through stage 4 chronic kidney disease, or unspecified chronic kidney disease: Secondary | ICD-10-CM | POA: Diagnosis not present

## 2019-09-19 DIAGNOSIS — E1122 Type 2 diabetes mellitus with diabetic chronic kidney disease: Secondary | ICD-10-CM | POA: Diagnosis not present

## 2019-09-19 DIAGNOSIS — D509 Iron deficiency anemia, unspecified: Secondary | ICD-10-CM

## 2019-09-19 DIAGNOSIS — R5382 Chronic fatigue, unspecified: Secondary | ICD-10-CM | POA: Diagnosis not present

## 2019-09-19 DIAGNOSIS — C187 Malignant neoplasm of sigmoid colon: Secondary | ICD-10-CM | POA: Diagnosis not present

## 2019-09-19 DIAGNOSIS — Z7982 Long term (current) use of aspirin: Secondary | ICD-10-CM | POA: Diagnosis not present

## 2019-09-19 DIAGNOSIS — Z803 Family history of malignant neoplasm of breast: Secondary | ICD-10-CM | POA: Diagnosis not present

## 2019-09-19 DIAGNOSIS — E785 Hyperlipidemia, unspecified: Secondary | ICD-10-CM | POA: Diagnosis not present

## 2019-09-19 DIAGNOSIS — R197 Diarrhea, unspecified: Secondary | ICD-10-CM | POA: Diagnosis not present

## 2019-09-19 DIAGNOSIS — Z79899 Other long term (current) drug therapy: Secondary | ICD-10-CM | POA: Diagnosis not present

## 2019-09-19 DIAGNOSIS — R531 Weakness: Secondary | ICD-10-CM | POA: Diagnosis not present

## 2019-09-19 MED ORDER — SODIUM CHLORIDE 0.9 % IV SOLN
510.0000 mg | Freq: Once | INTRAVENOUS | Status: AC
  Administered 2019-09-19: 510 mg via INTRAVENOUS
  Filled 2019-09-19: qty 510

## 2019-09-19 MED ORDER — SODIUM CHLORIDE 0.9 % IV SOLN
Freq: Once | INTRAVENOUS | Status: AC
  Filled 2019-09-19: qty 250

## 2019-09-19 NOTE — Progress Notes (Signed)
Pt tolerated Feraheme infusion well with no signs of complications or reactions. Pt observed for 30 minutes post infusion per policy. VSS. RN educated pt on the importance of notifying the clinic if any complications occur at home. Pt verbalized understanding and all questions answered at this time.   Sharan Mcenaney [employer]

## 2019-09-23 ENCOUNTER — Ambulatory Visit: Payer: Self-pay | Admitting: Pharmacist

## 2019-09-23 DIAGNOSIS — I10 Essential (primary) hypertension: Secondary | ICD-10-CM

## 2019-09-23 DIAGNOSIS — E1169 Type 2 diabetes mellitus with other specified complication: Secondary | ICD-10-CM | POA: Diagnosis not present

## 2019-09-23 DIAGNOSIS — E1149 Type 2 diabetes mellitus with other diabetic neurological complication: Secondary | ICD-10-CM

## 2019-09-23 DIAGNOSIS — E1165 Type 2 diabetes mellitus with hyperglycemia: Secondary | ICD-10-CM | POA: Diagnosis not present

## 2019-09-23 DIAGNOSIS — IMO0002 Reserved for concepts with insufficient information to code with codable children: Secondary | ICD-10-CM

## 2019-09-23 DIAGNOSIS — E785 Hyperlipidemia, unspecified: Secondary | ICD-10-CM

## 2019-09-23 NOTE — Patient Instructions (Signed)
Thank you allowing the Chronic Care Management Team to be a part of your care! It was a pleasure speaking with you today!     CCM (Chronic Care Management) Team    Noreene Larsson RN, MSN, CCM Nurse Care Coordinator  [phone]   Harlow Asa PharmD  Clinical Pharmacist  [phone]   [name] LCSW Clinical Social Worker [phone]  Visit Information  Goals Addressed            This Visit's Progress   . PharmD - Medication Management       Current Barriers:  . Financial Barriers . Non adherence to prescribed medication regimen  Pharmacist Clinical Goal(s):  [name] Over the next 30 days, patient will work with [name] to address needs related to optimization of medication regimen and improvement of medication adherence  Interventions: . Perform chart review . Follow up with patient and daughter today regarding medication adherence o Note patient living in a Senior apartment, ~1 mile away from daughter's home.  - Report daughter continues to take patient to medical appointments o Report patient continued to take medicine directly from pill bottles. Daughter reports patient declined to switch to pill packs as she liked to manage this independently o Speak with patient and daughter together today and patient admits to often getting confused by having multiple medication bottles to manage. Reports multiple times/week feels confused about whether she has taken her medicine for the day o Again discuss medication adherence tools, including weekly pillbox (as daughter previously prepared for patient) and pill packaging - Patient states that she is ready to switch to pill packaging pharmacy. Report patient and daughter will call [facility] to start pill packaging service. Daughter confirms having phone number . Follow up regarding patient's CBG monitoring and control o Reports patient taking: - Metformin 1000 mg twice daily - Lantus 20 units daily  - Humalog  according to sliding scale as provided by Endocrinology o Reports recent CBGs ranging: 90s-150s - Daughter notes CBGs have decreased as patient has been eating less, states patient not eating as much "because her bowels are so bad." . Daughter confirms will attend upcoming appointment with [facility] with patient next week o Daughter reports that she looks over patient's blood sugar log regularly . Reports patient's BP recently running ~130s/80s  Patient Self Care Activities:  . Self administers medications as prescribed o Using weekly pillbox and calendar as adherence tools . Attends all scheduled provider appointments o Appointment with [facility] on 6/1 o Next appointment with Endocrinology on 6/2 . Calls pharmacy for medication refills . Calls provider office for new concerns or questions . Patient to check blood sugar as directed by Endocrinologist and keep log  Please see past updates related to this goal by clicking on the "Past Updates" button in the selected goal         Patient verbalizes understanding of instructions provided today.   The care management team will reach out to the patient again over the next 30 days.   Harlow Asa, PharmD, [place] [employer] [phone]

## 2019-09-23 NOTE — Chronic Care Management (AMB) (Signed)
Chronic Care Management   Follow Up Note   09/23/2019 Name: [name] MRN: [id number] DOB: 1966-01-20  Referred by: [name], DO Reason for referral : Chronic Care Management (Patient Phone Call)   [name] is a 54 y.o. year old female who is a primary care patient of [name], DO. The CCM team was consulted for assistance with chronic disease management and care coordination needs. [name] has a past medical history including but not limited to type 2 diabetes, adenocarcinoma of sigmoid colon, hypertension and hyperlipidemia.  I reached out to [name] and her daughter [name] by phone today.   Review of patient status, including review of consultants reports, relevant laboratory and other test results, and collaboration with appropriate care team members and the patient's provider was performed as part of comprehensive patient evaluation and provision of chronic care management services.     Outpatient Encounter Medications as of 09/23/2019  Medication Sig Note  . Insulin Glargine (LANTUS SOLOSTAR) 100 UNIT/ML Solostar Pen Inject 60 Units into the skin daily before breakfast. (Patient taking differently: Inject 20 Units into the skin daily before breakfast. )   . acetaminophen (TYLENOL) 325 MG tablet Take 2 tablets (650 mg total) by mouth every 6 (six) hours as needed for mild pain.   . ARIPiprazole (ABILIFY) 10 MG tablet Take 10 mg by mouth at bedtime.    [name] ascorbic acid (VITAMIN C) 500 MG tablet Take 500 mg by mouth daily.   [name] aspirin EC 81 MG tablet Take 81 mg by mouth daily.    [name] atorvastatin (LIPITOR) 40 MG tablet Take 1 tablet (40 mg total) by mouth daily.   . Blood Glucose Monitoring Suppl (ONETOUCH VERIO) w/Device KIT Use to check blood sugar up to 3 x daily (Patient not taking: Reported on 09/17/2019)   . buPROPion (WELLBUTRIN XL) 300 MG 24 hr tablet Take 300 mg by mouth daily.   . colestipol (COLESTID) 1 g  tablet TAKE 2 TABLETS(2 GRAMS) BY MOUTH TWICE DAILY FOR DIARRHEA (Patient taking differently: Take 2 g by mouth every other day. )   . Ferrous Sulfate 27 MG TABS Take 1 tablet by mouth daily.    . furosemide (LASIX) 40 MG tablet Take 1 tablet (40 mg total) by mouth daily.   [name] gabapentin (NEURONTIN) 300 MG capsule Take 1 capsule (300 mg total) by mouth at bedtime.   . hydrochlorothiazide (HYDRODIURIL) 12.5 MG tablet Take 2 tablets (25 mg total) by mouth daily.   . insulin lispro (HUMALOG) 100 UNIT/ML KwikPen Inject 12 Units into the skin 3 (three) times daily.    . Lancets (ONETOUCH ULTRASOFT) lancets Use to check blood sugar up to 3 x daily (Patient not taking: Reported on 09/17/2019)   . Loperamide HCl (IMODIUM PO) Take 1 capsule by mouth daily as needed (diarrhea).   . metFORMIN (GLUCOPHAGE) 1000 MG tablet Take 1 tablet (1,000 mg total) by mouth 2 (two) times daily with a meal.   . metoprolol succinate (TOPROL-XL) 50 MG 24 hr tablet Take 1 tablet (50 mg total) by mouth daily. Take with or immediately following a meal.   . ondansetron (ZOFRAN) 4 MG tablet Take 1 tablet (4 mg total) by mouth every 6 (six) hours. (Patient taking differently: Take 4 mg by mouth every 8 (eight) hours as needed for nausea or vomiting. )   . ONETOUCH VERIO test strip Check blood sugar up to 5 x daily as needed (Patient not taking: Reported  on 09/17/2019)   . pantoprazole (PROTONIX) 40 MG tablet Take 1 tablet (40 mg total) by mouth 2 (two) times daily before a meal. (Patient taking differently: Take 40 mg by mouth daily. )   . traZODone (DESYREL) 50 MG tablet Take 1 tablet (50 mg total) by mouth at bedtime as needed for sleep. 07/17/2019: Have not started   . vitamin B-12 1000 MCG tablet Take 1 tablet (1,000 mcg total) by mouth daily.    No facility-administered encounter medications on file as of 09/23/2019.    Goals Addressed            This Visit's Progress   . PharmD - Medication Management       Current  Barriers:  . Financial Barriers . Non adherence to prescribed medication regimen  Pharmacist Clinical Goal(s):  [name] Over the next 30 days, patient will work with [name] to address needs related to optimization of medication regimen and improvement of medication adherence  Interventions: . Perform chart review . Follow up with patient and daughter today regarding medication adherence o Note patient living in a Senior apartment, ~1 mile away from daughter's home.  - Report daughter continues to take patient to medical appointments o Report patient continued to take medicine directly from pill bottles. Daughter reports patient declined to switch to pill packs as she liked to manage this independently o Speak with patient and daughter together today and patient admits to often getting confused by having multiple medication bottles to manage. Reports multiple times/week feels confused about whether she has taken her medicine for the day o Again discuss medication adherence tools, including weekly pillbox (as daughter previously prepared for patient) and pill packaging - Patient states that she is ready to switch to pill packaging pharmacy. Report patient and daughter will call [facility] to start pill packaging service. Daughter confirms having phone number . Follow up regarding patient's CBG monitoring and control o Reports patient taking: - Metformin 1000 mg twice daily - Lantus 20 units daily  - Humalog according to sliding scale as provided by Endocrinology o Reports recent CBGs ranging: 90s-150s - Daughter notes CBGs have decreased as patient has been eating less, states patient not eating as much "because her bowels are so bad." . Daughter confirms will attend upcoming appointment with [facility] with patient next week o Daughter reports that she looks over patient's blood sugar log regularly . Reports patient's BP recently running ~130s/80s  Patient Self Care Activities:   . Self administers medications as prescribed o Using weekly pillbox and calendar as adherence tools . Attends all scheduled provider appointments o Appointment with [facility] on 6/1 o Next appointment with Endocrinology on 6/2 . Calls pharmacy for medication refills . Calls provider office for new concerns or questions . Patient to check blood sugar as directed by Endocrinologist and keep log  Please see past updates related to this goal by clicking on the "Past Updates" button in the selected goal         Plan  The care management team will reach out to the patient again over the next 30 days.   Harlow Asa, PharmD, [place] [employer] [phone]

## 2019-09-25 DIAGNOSIS — B351 Tinea unguium: Secondary | ICD-10-CM | POA: Diagnosis not present

## 2019-09-25 DIAGNOSIS — E1142 Type 2 diabetes mellitus with diabetic polyneuropathy: Secondary | ICD-10-CM | POA: Diagnosis not present

## 2019-10-01 ENCOUNTER — Encounter: Payer: Self-pay | Admitting: *Deleted

## 2019-10-01 ENCOUNTER — Ambulatory Visit: Payer: Self-pay | Admitting: Licensed Clinical Social Worker

## 2019-10-01 ENCOUNTER — Ambulatory Visit: Payer: Medicare Other | Admitting: Gastroenterology

## 2019-10-01 NOTE — Chronic Care Management (AMB) (Signed)
  Care Management   Follow Up Note   10/01/2019 Name: [name] MRN: [id number] DOB: [birth year]  Referred by: [name], DO Reason for referral : Care Coordination   [name] is a 54 y.o. year old female who is a primary care patient of [name], DO. The care management team was consulted for assistance with care management and care coordination needs.    Review of patient status, including review of consultants reports, relevant laboratory and other test results, and collaboration with appropriate care team members and the patient's provider was performed as part of comprehensive patient evaluation and provision of chronic care management services.    LCSW completed CCM outreach attempt today but was unable to reach patient successfully. A HIPPA compliant voice message was left encouraging patient to return call once available. LCSW rescheduled CCM SW appointment as well.  A HIPPA compliant phone message was left for the patient providing contact information and requesting a return call.   [name], BSW, MSW, [name] Phone: [phone]

## 2019-10-02 DIAGNOSIS — E1159 Type 2 diabetes mellitus with other circulatory complications: Secondary | ICD-10-CM | POA: Diagnosis not present

## 2019-10-02 DIAGNOSIS — E1129 Type 2 diabetes mellitus with other diabetic kidney complication: Secondary | ICD-10-CM | POA: Diagnosis not present

## 2019-10-02 DIAGNOSIS — E1165 Type 2 diabetes mellitus with hyperglycemia: Secondary | ICD-10-CM | POA: Diagnosis not present

## 2019-10-02 DIAGNOSIS — Z794 Long term (current) use of insulin: Secondary | ICD-10-CM | POA: Diagnosis not present

## 2019-10-02 DIAGNOSIS — E782 Mixed hyperlipidemia: Secondary | ICD-10-CM | POA: Diagnosis not present

## 2019-10-03 ENCOUNTER — Ambulatory Visit: Payer: Self-pay

## 2019-10-03 DIAGNOSIS — M14672 Charcot's joint, left ankle and foot: Secondary | ICD-10-CM | POA: Diagnosis not present

## 2019-10-03 NOTE — Chronic Care Management (AMB) (Signed)
  Care Management   Follow Up Note   10/03/2019 Name: [name] MRN: [id number] DOB: May 02, 1966  Referred by: [name], DO Reason for referral : Care Coordination   [name] is a 54 y.o. year old female who is a primary care patient of [name], DO. The care management team was consulted for assistance with care management and care coordination needs.    Review of patient status, including review of consultants reports, relevant laboratory and other test results, and collaboration with appropriate care team members and the patient's provider was performed as part of comprehensive patient evaluation and provision of chronic care management services.    LCSW completed CCM outreach attempt today but was unable to reach patient successfully. A HIPPA compliant voice message was left encouraging patient to return call once available. LCSW rescheduled CCM SW appointment for next month as well.   A HIPPA compliant phone message was left for the patient providing contact information and requesting a return call.    [name], BSW, MSW, [email or web address] Phone: [phone]

## 2019-10-09 ENCOUNTER — Telehealth: Payer: Self-pay

## 2019-10-10 ENCOUNTER — Other Ambulatory Visit: Payer: Self-pay | Admitting: Family Medicine

## 2019-10-10 DIAGNOSIS — F5104 Psychophysiologic insomnia: Secondary | ICD-10-CM

## 2019-10-15 ENCOUNTER — Telehealth: Payer: Self-pay | Admitting: *Deleted

## 2019-10-15 NOTE — Telephone Encounter (Signed)
Pt made aware of referral to [facility] from PCP. Pt is in agreement to have upcoming CT scan and follow up as recommended. Pt has been made aware of upcoming appts for follow up CT scan and follow up appt with Faythe Casa, NP in the [facility]. Pt verbalized understanding. Nothing further needed at this time.  appts mailed per pt request.

## 2019-10-15 NOTE — Telephone Encounter (Signed)
Message left with patient to review upcoming appts for the lung [facility]ule clinic. Instructed to call back to further review and discuss.

## 2019-10-23 ENCOUNTER — Ambulatory Visit (INDEPENDENT_AMBULATORY_CARE_PROVIDER_SITE_OTHER): Payer: Medicare Other | Admitting: Pharmacist

## 2019-10-23 ENCOUNTER — Other Ambulatory Visit: Payer: Self-pay | Admitting: Family Medicine

## 2019-10-23 DIAGNOSIS — D500 Iron deficiency anemia secondary to blood loss (chronic): Secondary | ICD-10-CM

## 2019-10-23 DIAGNOSIS — E1169 Type 2 diabetes mellitus with other specified complication: Secondary | ICD-10-CM | POA: Diagnosis not present

## 2019-10-23 DIAGNOSIS — E1149 Type 2 diabetes mellitus with other diabetic neurological complication: Secondary | ICD-10-CM | POA: Diagnosis not present

## 2019-10-23 DIAGNOSIS — E785 Hyperlipidemia, unspecified: Secondary | ICD-10-CM | POA: Diagnosis not present

## 2019-10-23 DIAGNOSIS — E1165 Type 2 diabetes mellitus with hyperglycemia: Secondary | ICD-10-CM | POA: Diagnosis not present

## 2019-10-23 DIAGNOSIS — I10 Essential (primary) hypertension: Secondary | ICD-10-CM | POA: Diagnosis not present

## 2019-10-23 DIAGNOSIS — IMO0002 Reserved for concepts with insufficient information to code with codable children: Secondary | ICD-10-CM

## 2019-10-23 DIAGNOSIS — R197 Diarrhea, unspecified: Secondary | ICD-10-CM

## 2019-10-23 DIAGNOSIS — K909 Intestinal malabsorption, unspecified: Secondary | ICD-10-CM

## 2019-10-23 DIAGNOSIS — Z9049 Acquired absence of other specified parts of digestive tract: Secondary | ICD-10-CM

## 2019-10-23 DIAGNOSIS — K219 Gastro-esophageal reflux disease without esophagitis: Secondary | ICD-10-CM

## 2019-10-23 NOTE — Patient Instructions (Signed)
Thank you allowing the Chronic Care Management Team to be a part of your care! It was a pleasure speaking with you today!     CCM (Chronic Care Management) Team    Noreene Larsson RN, MSN, CCM Nurse Care Coordinator  [phone]   Harlow Asa PharmD  Clinical Pharmacist  [phone]   [name] LCSW Clinical Social Worker [phone]  Visit Information  Goals Addressed            This Visit's Progress   . PharmD - Medication Management       Current Barriers:  . Financial Barriers . Non adherence to prescribed medication regimen  Pharmacist Clinical Goal(s):  [name] Over the next 30 days, patient will work with [name] to address needs related to optimization of medication regimen and improvement of medication adherence  Interventions: . Perform chart review o Note patient seen by Endocrinology on 6/2 - A1C 8.4%  - Patient advised to: "-Decrease Metformin 1000 mg from twice down to once daily dosing secondary to decreased EGFR -Increase Lantus from 20 up to 24 units daily -Discontinue Humalog, as patient is alone for a great deal of the day and unable to independently monitor blood sugars and give herself insulin as needed." . Follow up with daughter/caregiver today regarding medication adherence o Note patient living in a Senior apartment, ~1 mile away from daughter's home.  - Report daughter continues to take patient to medical appointments o Reports she and patient called [facility] last month to start pill packaging service.  - Reports [facility] has transferred in patient's prescriptions, but was waiting until this month to start pill packaging service (based on refill dates), planning to start in next 1-2 weeks - Counsel on importance of letting all patient's providers know patient's current pharmacy so that medication changes/new medications are incorporated into her pill pack - update this information in local record . Follow up  regarding patient's CBG monitoring and control o Reports patient taking: - Metformin 1000 mg - not sure if patient taking once or twice daily - Lantus 24 units daily - Confirms no longer taking Humalog as directed by Endocrinology o Daughter/caregiver confirms she will follow up with patient to confirm she is taking metformin according to updated instruction from Endocrinology, metformin 1000 mg once daily o Unable to confirm CBG readings today . Will collaborate with PCP - Daughter/caregiver asks about referral to new GI specialist, [facility], as patient living in [place], [place]  Patient Self Care Activities:  . Self administers medications as prescribed o Using weekly pillbox and calendar as adherence tools . Attends all scheduled provider appointments o Next appointment with Endocrinology on 8/11 . Calls pharmacy for medication refills . Calls provider office for new concerns or questions . Patient to check blood sugar as directed by Endocrinologist and keep log  Please see past updates related to this goal by clicking on the "Past Updates" button in the selected goal         Patient verbalizes understanding of instructions provided today.   The care management team will reach out to the patient again over the next 30 days.   Harlow Asa, PharmD, [address] [employer] [phone]

## 2019-10-23 NOTE — Progress Notes (Signed)
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 Dr Marius Ditch ([facility]) and [facility]   Does the patient have a provider or location preference for the referral?: [facility] Dr Hildred Laser or any other available provider at this location. - due to proximity to patient  Would the patient like to see previous specialist if applicable?  [name], [facility] 10/23/2019, 6:36 PM

## 2019-10-23 NOTE — Chronic Care Management (AMB) (Signed)
Chronic Care Management   Follow Up Note   10/23/2019 Name: SHAMIYAH [name] MRN: [id number] DOB: July 29, 1965  Referred by: [name], DO Reason for referral : Chronic Care Management (Patient/Caregiver Phone Call)   [name] is a 54 y.o. year old female who is a primary care patient of [name], DO. The CCM team was consulted for assistance with chronic disease management and care coordination needs.  Ms. Garramone has a past medical history including but not limited to type 2 diabetes, adenocarcinoma of sigmoid colon, hypertension and hyperlipidemia.  Unable to reach patient today on home phone number. Place call to mobile number and speak with patient's daughter/caregiver, [name].  Review of patient status, including review of consultants reports, relevant laboratory and other test results, and collaboration with appropriate care team members and the patient's provider was performed as part of comprehensive patient evaluation and provision of chronic care management services.     Outpatient Encounter Medications as of 10/23/2019  Medication Sig  . insulin glargine (LANTUS) 100 UNIT/ML Solostar Pen Inject 24 Units into the skin at bedtime.  . metFORMIN (GLUCOPHAGE) 1000 MG tablet Take 1 tablet by mouth daily.  [name] acetaminophen (TYLENOL) 325 MG tablet Take 2 tablets (650 mg total) by mouth every 6 (six) hours as needed for mild pain.  . ARIPiprazole (ABILIFY) 10 MG tablet Take 10 mg by mouth at bedtime.   [name] ascorbic acid (VITAMIN C) 500 MG tablet Take 500 mg by mouth daily.  [name] aspirin EC 81 MG tablet Take 81 mg by mouth daily.   [name] atorvastatin (LIPITOR) 40 MG tablet Take 1 tablet (40 mg total) by mouth daily.  . Blood Glucose Monitoring Suppl (ONETOUCH VERIO) w/Device KIT Use to check blood sugar up to 3 x daily (Patient not taking: Reported on 09/17/2019)  . buPROPion (WELLBUTRIN XL) 300 MG 24 hr tablet Take 300 mg by mouth daily.  .  colestipol (COLESTID) 1 g tablet TAKE 2 TABLETS(2 GRAMS) BY MOUTH TWICE DAILY FOR DIARRHEA (Patient taking differently: Take 2 g by mouth every other day. )  . Ferrous Sulfate 27 MG TABS Take 1 tablet by mouth daily.   . furosemide (LASIX) 40 MG tablet Take 1 tablet (40 mg total) by mouth daily.  [name] gabapentin (NEURONTIN) 300 MG capsule Take 1 capsule (300 mg total) by mouth at bedtime.  . hydrochlorothiazide (HYDRODIURIL) 12.5 MG tablet Take 2 tablets (25 mg total) by mouth daily.  . Lancets (ONETOUCH ULTRASOFT) lancets Use to check blood sugar up to 3 x daily (Patient not taking: Reported on 09/17/2019)  . Loperamide HCl (IMODIUM PO) Take 1 capsule by mouth daily as needed (diarrhea).  . metoprolol succinate (TOPROL-XL) 50 MG 24 hr tablet Take 1 tablet (50 mg total) by mouth daily. Take with or immediately following a meal.  . ondansetron (ZOFRAN) 4 MG tablet Take 1 tablet (4 mg total) by mouth every 6 (six) hours. (Patient taking differently: Take 4 mg by mouth every 8 (eight) hours as needed for nausea or vomiting. )  . ONETOUCH VERIO test strip Check blood sugar up to 5 x daily as needed (Patient not taking: Reported on 09/17/2019)  . pantoprazole (PROTONIX) 40 MG tablet Take 1 tablet (40 mg total) by mouth 2 (two) times daily before a meal. (Patient taking differently: Take 40 mg by mouth daily. )  . traZODone (DESYREL) 50 MG tablet TAKE 1 TABLET(50 MG) BY MOUTH AT BEDTIME AS NEEDED FOR SLEEP  . vitamin B-12  1000 MCG tablet Take 1 tablet (1,000 mcg total) by mouth daily.  . [DISCONTINUED] Insulin Glargine (LANTUS SOLOSTAR) 100 UNIT/ML Solostar Pen Inject 60 Units into the skin daily before breakfast. (Patient taking differently: Inject 20 Units into the skin daily before breakfast. )  . [DISCONTINUED] insulin lispro (HUMALOG) 100 UNIT/ML KwikPen Inject 12 Units into the skin 3 (three) times daily.   . [DISCONTINUED] metFORMIN (GLUCOPHAGE) 1000 MG tablet Take 1 tablet (1,000 mg total) by mouth 2  (two) times daily with a meal.   No facility-administered encounter medications on file as of 10/23/2019.    Goals Addressed            This Visit's Progress   . PharmD - Medication Management       Current Barriers:  . Financial Barriers . Non adherence to prescribed medication regimen  Pharmacist Clinical Goal(s):  [name] Over the next 30 days, patient will work with [name] to address needs related to optimization of medication regimen and improvement of medication adherence  Interventions: . Perform chart review o Note patient seen by Endocrinology on 6/2 - A1C 8.4%  - Patient advised to: "-Decrease Metformin 1000 mg from twice down to once daily dosing secondary to decreased EGFR -Increase Lantus from 20 up to 24 units daily -Discontinue Humalog, as patient is alone for a great deal of the day and unable to independently monitor blood sugars and give herself insulin as needed." . Follow up with daughter/caregiver today regarding medication adherence o Note patient living in a Senior apartment, ~1 mile away from daughter's home.  - Report daughter continues to take patient to medical appointments o Reports she and patient called [facility] last month to start pill packaging service.  - Reports [facility] has transferred in patient's prescriptions, but was waiting until this month to start pill packaging service (based on refill dates), planning to start in next 1-2 weeks - Counsel on importance of letting all patient's providers know patient's current pharmacy so that medication changes/new medications are incorporated into her pill pack - update this information in local record . Follow up regarding patient's CBG monitoring and control o Reports patient taking: - Metformin 1000 mg - not sure if patient taking once or twice daily - Lantus 24 units daily - Confirms no longer taking Humalog as directed by Endocrinology o Daughter/caregiver confirms she will  follow up with patient to confirm she is taking metformin according to updated instruction from Endocrinology, metformin 1000 mg once daily o Unable to confirm CBG readings today . Will collaborate with PCP - Daughter/caregiver asks about referral to new GI specialist, [facility], as patient living in [place], [place]  Patient Self Care Activities:  . Self administers medications as prescribed o Using weekly pillbox and calendar as adherence tools . Attends all scheduled provider appointments o Next appointment with Endocrinology on 8/11 . Calls pharmacy for medication refills . Calls provider office for new concerns or questions . Patient to check blood sugar as directed by Endocrinologist and keep log  Please see past updates related to this goal by clicking on the "Past Updates" button in the selected goal         Plan  The care management team will reach out to the patient again over the next 30 days.   Harlow Asa, PharmD, [place] [employer] [phone]

## 2019-10-24 ENCOUNTER — Telehealth: Payer: Self-pay | Admitting: General Practice

## 2019-10-24 ENCOUNTER — Ambulatory Visit: Payer: Self-pay | Admitting: General Practice

## 2019-10-24 DIAGNOSIS — E1149 Type 2 diabetes mellitus with other diabetic neurological complication: Secondary | ICD-10-CM | POA: Diagnosis not present

## 2019-10-24 DIAGNOSIS — E1165 Type 2 diabetes mellitus with hyperglycemia: Secondary | ICD-10-CM | POA: Diagnosis not present

## 2019-10-24 DIAGNOSIS — E785 Hyperlipidemia, unspecified: Secondary | ICD-10-CM | POA: Diagnosis not present

## 2019-10-24 DIAGNOSIS — I10 Essential (primary) hypertension: Secondary | ICD-10-CM | POA: Diagnosis not present

## 2019-10-24 DIAGNOSIS — IMO0002 Reserved for concepts with insufficient information to code with codable children: Secondary | ICD-10-CM

## 2019-10-24 DIAGNOSIS — E1169 Type 2 diabetes mellitus with other specified complication: Secondary | ICD-10-CM

## 2019-10-24 DIAGNOSIS — F251 Schizoaffective disorder, depressive type: Secondary | ICD-10-CM

## 2019-10-24 NOTE — Patient Instructions (Signed)
Visit Information  Goals Addressed              This Visit's Progress     RNCM: Pt-"I am living on my own now" (pt-stated)        Current Barriers:   Chronic Disease Managemant support, education, and care coordination needs related to HTN, DMII, and Schizo affective disorder  Clinical Goal(s) related to HTN, DMII, and Schizo affective disorder :  Over the next 120 days, patient will:  Work with the care management team to address educational, disease management, and care coordination needs   Begin or continue self health monitoring activities as directed today The patients daughter, [name] is concerned because the patient is not taking her medications as directed, not doing daily self care, and refusing things just like she did before hospitalization.    Call provider office for new or worsened signs and symptoms New or worsened symptom related to HTN, DM and Schizoaffective disorder  Call care management team with questions or concerns  Verbalize basic understanding of patient centered plan of care established today   Interventions related to HTN, DMII, and Schizo affective disorder :   Evaluation of current treatment plans and patient's adherence to plan as established by provider   Assessed patient understanding of disease states: RNCM spoke to the patient for the first time today since working with the patient. The patient was happy and is adjusting to her new living arrangements. She was able to give an accurate recap of her cardiology appointment on Monday. The patient feels she is doing well and wants to stay out of the hospital. 10-24-2019: Attempted to reach the patient but left VM. Call made to the patients daughter [name]. [name] states the patient is doing well health wise but not mentally. The patient does not like living alone. She is doing okay with it but the daughter wishes the patient would be more engaged with activities and doing things.  She says after  the first month that she can see a change in her mental status. The daughter is calling her psychiatrist today to see if she can get her appointment moved up to sooner.   Assessed patient's education and care coordination needs: Working with the [place] and Pharmacist.  The patient has her medications in a pill container and knows when to take her medications. She verbalized she has multiple medications that she takes but she is taking as prescribed. She is taking her iron supplements along with other Vitamins. 6-24-2021[name] endorses that the patient is taking her medications as ordered. She is at the patients house often and makes sure she is taking her medications correctly.   Provided disease specific education to the patient.  The patient is watching her diet and eating a heart Healthy/ADA diet.  Her daughter has prepared foods for the patient and all she has to do is warm them up in the microwave. The patient verbalized she is taking her blood sugars and blood pressures at home. This am her blood sugar was 145 and her blood pressure was 144/76. 10-24-2019: the patients blood sugars are around 130.  The patients daughter states that the MD took her off of her short acting insulin, she takes 20 units of long acting and her sugars are still doing well. The patient is eating well and the daughter monitors her meals.   Collaborated with appropriate clinical care team members regarding patient needs.  10-24-2019: Will collaborate with the LCSW. The daughter expressed that the aide  was coming in everyday to work with the patient but now only one hour 3 times a week. The patient was benefiting greatly from the aide coming. The daughter ask if this is something that they could get more hours for. Education given and will touch base with LCSW about this.   Assessed the for evaluation for psychiatric needs. The daughter expressed they did an evaluation on the patient in the hospital and said they could not find  any reason for her to be kept for psychiatric needs. Completed.  10-24-2019: [name] is concerned that the patients mental status is declining. She does not fear the patient doing any self harm but states that the patient does not like living by herself. The daughter is going to call and see if she can get an earlier appointment with her psychiatrist.    Assessed current living arrangements and safety concerns. : 08-22-2019: The patient is now living in her own apartment that is near her daughter. This is the first time she has ever lived alone. She feels she is adjusting well. The patient sounded very positive and states that home health is working with her and also PT.  Spoke briefly to the patients daughter [name] and she feels this is a positive thing for her mom. 10-24-2019: The patient remains living independently but does not like living alone. Her daughter is close by.  Her daughter feels the patient is safe and is not concerned about her safety just her mental status.   Assessed depression and social isolation needs.  The patient feels safe in her new apartment.  She is working on making friends in the apartment complex and she also is "adjusting well".  She was a little sad when discussing her mother.  Her mother is now in a memory care facility in [place] close to where her brother lives. She gets to talk to her 2 times per week on the phone. This is a huge adjustment but she knows it is for the best. She says her daughter will take her for a visit soon. She is decorating her apartment and will have all her furniture next week. She is enjoying taking a shower and feels she is doing a good job at taking care of herself. 10-24-2019: Unable to speak to the patient today. The patients daughter verbalized the patient hardly ever answers the phone. The daughter had seen the patient this am and she was having a down day. The daughter feels if her mental status was better the patient would do much better.  Her daughter cleans for her and recently there was an inspection in the home. She had "pooped" on the bed and left it there. The daughter and patients sister try to engage her in crafts and things she can do but she does not want to do anything. Education and support given.   Patient Self Care Activities related to HTN, DMII, and schizo affective disorder :   Patient is unable to independently self-manage chronic health conditions  Please see past updates related to this goal by clicking on the "Past Updates" button in the selected goal  dates.        Patient verbalizes understanding of instructions provided today.   The care management team will reach out to the patient again over the next 30 to 60 days.   Gabriela Larsson RN, MSN, [place] Mobile: [phone]

## 2019-10-24 NOTE — Chronic Care Management (AMB) (Signed)
Chronic Care Management   Follow Up Note   10/24/2019 Name: [name] MRN: [id number] DOB: 1965-08-28  Referred by: [name], DO Reason for referral : Chronic Care Management (Follow up: RNCM: Chronic Disease Management and Care Coordination)   [name]  is a 54 y.o. year old female who is a primary care patient of [name], DO. The CCM team was consulted for assistance with chronic disease management and care coordination needs.    Review of patient status, including review of consultants reports, relevant laboratory and other test results, and collaboration with appropriate care team members and the patient's provider was performed as part of comprehensive patient evaluation and provision of chronic care management services.    SDOH (Social Determinants of Health) assessments performed: Yes See Care Plan activities for detailed interventions related to [facility])     Outpatient Encounter Medications as of 10/24/2019  Medication Sig   acetaminophen (TYLENOL) 325 MG tablet Take 2 tablets (650 mg total) by mouth every 6 (six) hours as needed for mild pain.   ARIPiprazole (ABILIFY) 10 MG tablet Take 10 mg by mouth at bedtime.    ascorbic acid (VITAMIN C) 500 MG tablet Take 500 mg by mouth daily.   aspirin EC 81 MG tablet Take 81 mg by mouth daily.    atorvastatin (LIPITOR) 40 MG tablet Take 1 tablet (40 mg total) by mouth daily.   Blood Glucose Monitoring Suppl (ONETOUCH VERIO) w/Device KIT Use to check blood sugar up to 3 x daily (Patient not taking: Reported on 09/17/2019)   buPROPion (WELLBUTRIN XL) 300 MG 24 hr tablet Take 300 mg by mouth daily.   colestipol (COLESTID) 1 g tablet TAKE 2 TABLETS(2 GRAMS) BY MOUTH TWICE DAILY FOR DIARRHEA (Patient taking differently: Take 2 g by mouth every other day. )   Ferrous Sulfate 27 MG TABS Take 1 tablet by mouth daily.    furosemide (LASIX) 40 MG tablet Take 1 tablet (40 mg total) by mouth  daily.   gabapentin (NEURONTIN) 300 MG capsule Take 1 capsule (300 mg total) by mouth at bedtime.   hydrochlorothiazide (HYDRODIURIL) 12.5 MG tablet Take 2 tablets (25 mg total) by mouth daily.   insulin glargine (LANTUS) 100 UNIT/ML Solostar Pen Inject 24 Units into the skin at bedtime.   Lancets (ONETOUCH ULTRASOFT) lancets Use to check blood sugar up to 3 x daily (Patient not taking: Reported on 09/17/2019)   Loperamide HCl (IMODIUM PO) Take 1 capsule by mouth daily as needed (diarrhea).   metFORMIN (GLUCOPHAGE) 1000 MG tablet Take 1 tablet by mouth daily.   metoprolol succinate (TOPROL-XL) 50 MG 24 hr tablet Take 1 tablet (50 mg total) by mouth daily. Take with or immediately following a meal.   ondansetron (ZOFRAN) 4 MG tablet Take 1 tablet (4 mg total) by mouth every 6 (six) hours. (Patient taking differently: Take 4 mg by mouth every 8 (eight) hours as needed for nausea or vomiting. )   ONETOUCH VERIO test strip Check blood sugar up to 5 x daily as needed (Patient not taking: Reported on 09/17/2019)   pantoprazole (PROTONIX) 40 MG tablet Take 1 tablet (40 mg total) by mouth 2 (two) times daily before a meal. (Patient taking differently: Take 40 mg by mouth daily. )   traZODone (DESYREL) 50 MG tablet TAKE 1 TABLET(50 MG) BY MOUTH AT BEDTIME AS NEEDED FOR SLEEP   vitamin B-12 1000 MCG tablet Take 1 tablet (1,000 mcg total) by mouth daily.   No  facility-administered encounter medications on file as of 10/24/2019.     Objective:  BP Readings from Last 3 Encounters:  09/19/19 127/85  09/17/19 137/78  09/10/19 115/66   Lab Results  Component Value Date   HGBA1C 8.5 (H) 05/23/2019    Goals Addressed              This Visit's Progress     RNCM: Pt-"I am living on my own now" (pt-stated)        Current Barriers:   Chronic Disease Managemant support, education, and care coordination needs related to HTN, DMII, and Schizo affective disorder  Clinical Goal(s) related to  HTN, DMII, and Schizo affective disorder :  Over the next 120 days, patient will:  Work with the care management team to address educational, disease management, and care coordination needs   Begin or continue self health monitoring activities as directed today The patients daughter, [name] is concerned because the patient is not taking her medications as directed, not doing daily self care, and refusing things just like she did before hospitalization.    Call provider office for new or worsened signs and symptoms New or worsened symptom related to HTN, DM and Schizoaffective disorder  Call care management team with questions or concerns  Verbalize basic understanding of patient centered plan of care established today   Interventions related to HTN, DMII, and Schizo affective disorder :   Evaluation of current treatment plans and patient's adherence to plan as established by provider   Assessed patient understanding of disease states: RNCM spoke to the patient for the first time today since working with the patient. The patient was happy and is adjusting to her new living arrangements. She was able to give an accurate recap of her cardiology appointment on Monday. The patient feels she is doing well and wants to stay out of the hospital. 10-24-2019: Attempted to reach the patient but left VM. Call made to the patients daughter [name]. [name] states the patient is doing well health wise but not mentally. The patient does not like living alone. She is doing okay with it but the daughter wishes the patient would be more engaged with activities and doing things.  She says after the first month that she can see a change in her mental status. The daughter is calling her psychiatrist today to see if she can get her appointment moved up to sooner.   Assessed patient's education and care coordination needs: Working with the [place] and Pharmacist.  The patient has her medications in a pill container  and knows when to take her medications. She verbalized she has multiple medications that she takes but she is taking as prescribed. She is taking her iron supplements along with other Vitamins. 6-24-2021[name] endorses that the patient is taking her medications as ordered. She is at the patients house often and makes sure she is taking her medications correctly.   Provided disease specific education to the patient.  The patient is watching her diet and eating a heart Healthy/ADA diet.  Her daughter has prepared foods for the patient and all she has to do is warm them up in the microwave. The patient verbalized she is taking her blood sugars and blood pressures at home. This am her blood sugar was 145 and her blood pressure was 144/76. 10-24-2019: the patients blood sugars are around 130.  The patients daughter states that the MD took her off of her short acting insulin, she takes 20 units of long  acting and her sugars are still doing well. The patient is eating well and the daughter monitors her meals.   Collaborated with appropriate clinical care team members regarding patient needs.  10-24-2019: Will collaborate with the LCSW. The daughter expressed that the aide was coming in everyday to work with the patient but now only one hour 3 times a week. The patient was benefiting greatly from the aide coming. The daughter ask if this is something that they could get more hours for. Education given and will touch base with LCSW about this.   Assessed the for evaluation for psychiatric needs. The daughter expressed they did an evaluation on the patient in the hospital and said they could not find any reason for her to be kept for psychiatric needs. Completed.  10-24-2019: [name] is concerned that the patients mental status is declining. She does not fear the patient doing any self harm but states that the patient does not like living by herself. The daughter is going to call and see if she can get an earlier  appointment with her psychiatrist.    Assessed current living arrangements and safety concerns. : 08-22-2019: The patient is now living in her own apartment that is near her daughter. This is the first time she has ever lived alone. She feels she is adjusting well. The patient sounded very positive and states that home health is working with her and also PT.  Spoke briefly to the patients daughter [name] and she feels this is a positive thing for her mom. 10-24-2019: The patient remains living independently but does not like living alone. Her daughter is close by.  Her daughter feels the patient is safe and is not concerned about her safety just her mental status.   Assessed depression and social isolation needs.  The patient feels safe in her new apartment.  She is working on making friends in the apartment complex and she also is "adjusting well".  She was a little sad when discussing her mother.  Her mother is now in a memory care facility in [place] close to where her brother lives. She gets to talk to her 2 times per week on the phone. This is a huge adjustment but she knows it is for the best. She says her daughter will take her for a visit soon. She is decorating her apartment and will have all her furniture next week. She is enjoying taking a shower and feels she is doing a good job at taking care of herself. 10-24-2019: Unable to speak to the patient today. The patients daughter verbalized the patient hardly ever answers the phone. The daughter had seen the patient this am and she was having a down day. The daughter feels if her mental status was better the patient would do much better. Her daughter cleans for her and recently there was an inspection in the home. She had "pooped" on the bed and left it there. The daughter and patients sister try to engage her in crafts and things she can do but she does not want to do anything. Education and support given.   Patient Self Care Activities related to  HTN, DMII, and schizo affective disorder :   Patient is unable to independently self-manage chronic health conditions  Please see past updates related to this goal by clicking on the "Past Updates" button in the selected goal  dates.         Plan:   The care management team will reach out to  the patient again over the next 30 to 60 days.    Noreene Larsson RN, MSN, [place] Mobile: [phone]

## 2019-10-28 ENCOUNTER — Encounter (INDEPENDENT_AMBULATORY_CARE_PROVIDER_SITE_OTHER): Payer: Self-pay | Admitting: Gastroenterology

## 2019-11-02 DIAGNOSIS — M14672 Charcot's joint, left ankle and foot: Secondary | ICD-10-CM | POA: Diagnosis not present

## 2019-11-14 ENCOUNTER — Telehealth: Payer: Self-pay

## 2019-11-18 ENCOUNTER — Other Ambulatory Visit: Payer: Self-pay | Admitting: Family Medicine

## 2019-11-18 DIAGNOSIS — Z9049 Acquired absence of other specified parts of digestive tract: Secondary | ICD-10-CM

## 2019-11-18 DIAGNOSIS — K909 Intestinal malabsorption, unspecified: Secondary | ICD-10-CM

## 2019-11-18 NOTE — Telephone Encounter (Signed)
Requested medication (s) are due for refill today: yes  Requested medication (s) are on the active medication list: yes  Last refill:  08/29/19  Future visit scheduled: no  Notes to clinic:  this med has been prescribed by her PCP and Dr Reece Levy- labs due   Requested Prescriptions  Pending Prescriptions Disp Refills   colestipol (COLESTID) 1 g tablet [Pharmacy Med Name: COLESTIPOL HCL 1 GM TABLET] 120 tablet 0    Sig: TAKE (2) TABLETS BY MOUTH TWICE DAILY.      Cardiovascular:  Antilipid - Bile Acid Sequestrants Failed - 11/18/2019  9:06 AM      Failed - Total Cholesterol in normal range and within 360 days    Cholesterol  Date Value Ref Range Status  11/07/2017 224 (H) <200 mg/dL Final          Failed - LDL in normal range and within 360 days    LDL Cholesterol (Calc)  Date Value Ref Range Status  11/07/2017 135 (H) mg/dL (calc) Final    Comment:    Reference range: <100 . Desirable range <100 mg/dL for primary prevention;   <70 mg/dL for patients with CHD or diabetic patients  with > or = 2 CHD risk factors. [name] LDL-C is now calculated using the Martin-Hopkins  calculation, which is a validated novel method providing  better accuracy than the Friedewald equation in the  estimation of LDL-C.  Cresenciano Genre et al. Annamaria Helling. 2694;854(62): 2061-2068  (http://education.[employer].com/faq/FAQ164)           Failed - HDL in normal range and within 360 days    HDL  Date Value Ref Range Status  11/07/2017 53 >50 mg/dL Final          Failed - Triglycerides in normal range and within 360 days    Triglycerides  Date Value Ref Range Status  11/07/2017 215 (H) <150 mg/dL Final          Passed - Valid encounter within last 12 months    Recent Outpatient Visits           3 months ago Adenocarcinoma of sigmoid colon [facility])   [place], DO   4 months ago Essential hypertension   [place],  DO   7 months ago Essential hypertension   [place], DO   8 months ago Charcot's joint of left foot   [place], DO   9 months ago DM (diabetes mellitus), type 2, uncontrolled w/neurologic complication [facility])   [facility], Devonne Doughty, DO

## 2019-11-20 ENCOUNTER — Ambulatory Visit: Payer: Self-pay | Admitting: Pharmacist

## 2019-11-20 ENCOUNTER — Telehealth: Payer: Self-pay

## 2019-11-20 NOTE — Chronic Care Management (AMB) (Signed)
  Chronic Care Management   Follow Up Note   11/20/2019 Name: [name] MRN: [id number] DOB: [birth year]  Referred by: [name], DO Reason for referral : Chronic Care Management (Patient Phone Call)   [name] is a 54 y.o. year old female who is a primary care patient of [name], DO. The CCM team was consulted for assistance with chronic disease management and care coordination needs.    Was unable to reach patient via telephone today and unable to leave a message as voicemail is full.  Was unable to reach patient and daughter via telephone today and unable to leave a message as no voicemail is setup.  Plan  The care management team will reach out to the patient again over the next 30 days.   Harlow Asa, PharmD, [employer] [phone]

## 2019-11-25 ENCOUNTER — Ambulatory Visit (INDEPENDENT_AMBULATORY_CARE_PROVIDER_SITE_OTHER): Payer: Medicare Other | Admitting: Gastroenterology

## 2019-11-25 ENCOUNTER — Encounter (INDEPENDENT_AMBULATORY_CARE_PROVIDER_SITE_OTHER): Payer: Self-pay | Admitting: Gastroenterology

## 2019-11-25 ENCOUNTER — Other Ambulatory Visit: Payer: Self-pay

## 2019-11-25 ENCOUNTER — Encounter (INDEPENDENT_AMBULATORY_CARE_PROVIDER_SITE_OTHER): Payer: Self-pay | Admitting: *Deleted

## 2019-11-25 VITALS — BP 145/90 | HR 114 | Temp 98.3°F | Ht 70.5 in | Wt 230.1 lb

## 2019-11-25 DIAGNOSIS — K529 Noninfective gastroenteritis and colitis, unspecified: Secondary | ICD-10-CM

## 2019-11-25 DIAGNOSIS — Z85038 Personal history of other malignant neoplasm of large intestine: Secondary | ICD-10-CM

## 2019-11-25 DIAGNOSIS — D509 Iron deficiency anemia, unspecified: Secondary | ICD-10-CM | POA: Diagnosis not present

## 2019-11-25 LAB — CBC
HCT: 34.6 % — ABNORMAL LOW (ref 35.0–45.0)
Hemoglobin: 11.2 g/dL — ABNORMAL LOW (ref 11.7–15.5)
MCH: 27.7 pg (ref 27.0–33.0)
MCHC: 32.4 g/dL (ref 32.0–36.0)
MCV: 85.4 fL (ref 80.0–100.0)
MPV: 11.6 fL (ref 7.5–12.5)
Platelets: 281 10*3/uL (ref 140–400)
RBC: 4.05 10*6/uL (ref 3.80–5.10)
RDW: 16.7 % — ABNORMAL HIGH (ref 11.0–15.0)
WBC: 8.9 10*3/uL (ref 3.8–10.8)

## 2019-11-25 LAB — COMPREHENSIVE METABOLIC PANEL
AG Ratio: 1.3 (calc) (ref 1.0–2.5)
ALT: 15 U/L (ref 6–29)
AST: 14 U/L (ref 10–35)
Albumin: 4.2 g/dL (ref 3.6–5.1)
Alkaline phosphatase (APISO): 140 U/L (ref 37–153)
BUN/Creatinine Ratio: 13 (calc) (ref 6–22)
BUN: 17 mg/dL (ref 7–25)
CO2: 25 mmol/L (ref 20–32)
Calcium: 10.2 mg/dL (ref 8.6–10.4)
Chloride: 104 mmol/L (ref 98–110)
Creat: 1.3 mg/dL — ABNORMAL HIGH (ref 0.50–1.05)
Globulin: 3.2 g/dL (calc) (ref 1.9–3.7)
Glucose, Bld: 193 mg/dL — ABNORMAL HIGH (ref 65–139)
Potassium: 4.5 mmol/L (ref 3.5–5.3)
Sodium: 138 mmol/L (ref 135–146)
Total Bilirubin: 0.3 mg/dL (ref 0.2–1.2)
Total Protein: 7.4 g/dL (ref 6.1–8.1)

## 2019-11-25 LAB — IRON,TIBC AND FERRITIN PANEL
Ferritin: 198 ng/mL (ref 16–232)
Iron: 57 ug/dL (ref 45–160)

## 2019-11-25 NOTE — Patient Instructions (Signed)
Schedule surveillance colonoscopy with random colon biopsies Start Metamucil fiber supplements daily to increase stool bulk Can take colestid as needed if diarrhea recurs Perform blood workup Continue follow up with [name] and with iron supplementation per their service

## 2019-11-25 NOTE — Progress Notes (Signed)
[name], M.D. Gastroenterology & Hepatology [facility] [address] [place], [place] [address] Primary Care Physician: [name], DO [address] [place] [address]  Referring MD: PCP  I will communicate my assessment and recommendations to the referring MD via EMR. Note: Occasional unusual wording and randomly placed punctuation marks may result from the use of speech recognition technology to transcribe this document"  Chief Complaint: PCP  History of Present Illness: [name] is a 54 y.o. 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  The patient has been followed by heme-onc (Dr. Delight Hoh) due to her history of stage I colon adenocarcinoma.  The last time she was seen by him was in May 2021.  Based on his notes, the patient had improvement of her iron stores and hemoglobin compared to prior, she has been receiving IV Feraheme in the past for management of iron deficiency anemia.  The last dose of IV iron was given in 09/19/2019.  The patient states that she has not felt any fatigue or shortness of breath.  Has not had any repeat blood testing since then.  She endorses that she has presented recurrent episodes of watery diarrhea since her surgery in January 2021.  States that she has bouts of explosive watery diarrhea multiple times.  She reported that this is usually triggered after she takes dairy products.  Has not noticed any other kind of food that leads to this.  She reports that when she has these episodes she takes 1 teaspoon of Imodium with resolution of her symptoms.  The patient also feels embarrassed that she has had some episodes of fecal soiling in the morning.   Also endorses having some fecal urgency.  She reports that she has been previously prescribed colestipol but she does not take it often as she did not know when to take it.  Notably, the patient had significant weight loss after her partial colectomy, she reports the most recently she has lost 7 pounds due to poor appetite. Denies having any nausea, vomiting, fever, chills, hematochezia, melena, hematemesis, abdominal distention, abdominal pain, jaundice, pruritus.  The patient denies taking any magnesium or other types of supplements.  I personally reviewed and interpreted the available lab results which showed: Most recent labs from 09/17/2019 showed CBC with normocytic anemia hemoglobin 9.2, MCV 82.8, rest of CBC within normal limits, CMP with normal hepatic panel, normal electrolytes, creatinine 1.48 slightly higher than baseline.  Most recent CEA from 09/10/2019 was 1.10 (neg).  Last EGD: 05/26/2019-small sessile polyps between 3 to 7 mm in the gastric fundus which were biopsied.  Otherwise normal exam.  Pathology negative for H. pylori or any other ulcerations.  Biopsies of GE junction were negative for EOE or dysplastic changes, changes consistent with mild esophagitis.  Last Colonoscopy: January 2021 showed a partially obstructing tumor in the mid sigmoid colon which was biopsied and tattooed, rest of exam was within normal limits.  Pathology of biopsies compatible with adenocarcinoma.  FHx: neg for any gastrointestinal/liver disease, grandparents had cancer but unknown type Social: quit smoking 11 years ago (used to smoke 3 packs a day), neg alcohol or illicit drug use Surgical: partial colectomy, cholecystectomy  Past Medical History: Past Medical History:  Diagnosis Date  . Depression   .  Diabetes mellitus without complication ([place])   . Hyperlipidemia   . Hypertension   . Neuromuscular disorder ([place])   . Schizo affective schizophrenia ([place])    abilify and pazil    Past Surgical  History: Past Surgical History:  Procedure Laterality Date  . CHOLECYSTECTOMY    . COLONOSCOPY WITH PROPOFOL N/A 05/26/2019   Procedure: COLONOSCOPY WITH PROPOFOL;  Surgeon: Toledo, Benay Pike, MD;  Location: [facility] ENDOSCOPY;  Service: Gastroenterology;  Laterality: N/A;  . ESOPHAGOGASTRODUODENOSCOPY (EGD) WITH PROPOFOL N/A 05/26/2019   Procedure: ESOPHAGOGASTRODUODENOSCOPY (EGD) WITH PROPOFOL;  Surgeon: Toledo, Benay Pike, MD;  Location: [facility] ENDOSCOPY;  Service: Gastroenterology;  Laterality: N/A;  . IRRIGATION AND DEBRIDEMENT FOOT Right 02/26/2016   Procedure: RIGHT 2ND TOE DEBRIDEMENT;  Surgeon: Samara Deist, DPM;  Location: [facility] ORS;  Service: Podiatry;  Laterality: Right;  . IRRIGATION AND DEBRIDEMENT FOOT Left 12/13/2018   Procedure: IRRIGATION AND DEBRIDEMENT FOOT;  Surgeon: Caroline More, DPM;  Location: [facility] ORS;  Service: Podiatry;  Laterality: Left;    Family History: Family History  Problem Relation Age of Onset  . Breast cancer Cousin        maternal cousin  . Hypertension Mother   . Hyperthyroidism Mother   . Dementia Mother   . Prostate cancer Father   . Diabetes Maternal Grandmother   . Hypertension Maternal Grandmother   . Cancer Maternal Grandmother        unknown type  . Diabetes Maternal Grandfather   . Hypertension Maternal Grandfather   . Diabetes Paternal Grandmother   . Hypertension Paternal Grandmother   . Diabetes Paternal Grandfather   . Hypertension Paternal Grandfather     Social History: Social History   Tobacco Use  Smoking Status Former Smoker  . Packs/day: 3.00  . Years: 30.00  . Pack years: 90.00  . Types: Cigarettes  . Quit date: 05/02/2009  . Years since quitting: 10.5  Smokeless Tobacco Never Used  Tobacco Comment   Pt reported  quitting in 2011   Social History   Substance and Sexual Activity  Alcohol Use No   Social History   Substance and Sexual Activity  Drug Use No    Allergies: No Known  Allergies  Medications: Current Outpatient Medications  Medication Sig Dispense Refill  . acetaminophen (TYLENOL) 325 MG tablet Take 2 tablets (650 mg total) by mouth every 6 (six) hours as needed for mild pain.    . ARIPiprazole (ABILIFY) 10 MG tablet Take 10 mg by mouth at bedtime.     [name] ascorbic acid (VITAMIN C) 500 MG tablet Take 500 mg by mouth daily.    [name] aspirin EC 81 MG tablet Take 81 mg by mouth daily.     [name] atorvastatin (LIPITOR) 40 MG tablet Take 1 tablet (40 mg total) by mouth daily. 90 tablet 3  . Blood Glucose Monitoring Suppl (ONETOUCH VERIO) w/Device KIT Use to check blood sugar up to 3 x daily 1 kit 0  . buPROPion (WELLBUTRIN XL) 300 MG 24 hr tablet Take 300 mg by mouth daily.    . colestipol (COLESTID) 1 g tablet TAKE (2) TABLETS BY MOUTH TWICE DAILY. 120 tablet 0  . Ferrous Sulfate 27 MG TABS Take 1 tablet by mouth daily.     . furosemide (LASIX) 40 MG tablet Take 1 tablet (40 mg total) by mouth daily. 90 tablet 1  . gabapentin (NEURONTIN) 300 MG capsule Take 1 capsule (300 mg total) by mouth at bedtime. 90 capsule 1  . hydrochlorothiazide (HYDRODIURIL) 12.5  MG tablet Take 2 tablets (25 mg total) by mouth daily. 90 tablet 1  . insulin glargine (LANTUS) 100 UNIT/ML Solostar Pen Inject 24 Units into the skin at bedtime.    . Lancets (ONETOUCH ULTRASOFT) lancets Use to check blood sugar up to 3 x daily 100 each 12  . Loperamide HCl (IMODIUM PO) Take 1 capsule by mouth daily as needed (diarrhea). Patient reports that this is a liquid form.    . metFORMIN (GLUCOPHAGE) 1000 MG tablet Take 1 tablet by mouth daily.    . metoprolol succinate (TOPROL-XL) 50 MG 24 hr tablet Take 1 tablet (50 mg total) by mouth daily. Take with or immediately following a meal. 90 tablet 0  . ondansetron (ZOFRAN) 4 MG tablet Take 1 tablet (4 mg total) by mouth every 6 (six) hours. (Patient taking differently: Take 4 mg by mouth every 8 (eight) hours as needed for nausea or vomiting. ) 12 tablet 0  .  ONETOUCH VERIO test strip Check blood sugar up to 5 x daily as needed 450 each 3  . traZODone (DESYREL) 50 MG tablet TAKE 1 TABLET(50 MG) BY MOUTH AT BEDTIME AS NEEDED FOR SLEEP 30 tablet 2  . vitamin B-12 1000 MCG tablet Take 1 tablet (1,000 mcg total) by mouth daily.    . pantoprazole (PROTONIX) 40 MG tablet Take 1 tablet (40 mg total) by mouth 2 (two) times daily before a meal. (Patient not taking: Reported on 11/25/2019) 180 tablet 1   No current facility-administered medications for this visit.    Review of Systems: GENERAL: negative for malaise, night sweats HEENT: No changes in hearing or vision, no nose bleeds or other nasal problems. NECK: Negative for lumps, goiter, pain and significant neck swelling RESPIRATORY: Negative for cough, wheezing CARDIOVASCULAR: Negative for chest pain, leg swelling, palpitations, orthopnea GI: SEE HPI MUSCULOSKELETAL: Negative for joint pain or swelling, back pain, and muscle pain. SKIN: Negative for lesions, rash PSYCH: Negative for sleep disturbance, mood disorder and recent psychosocial stressors. HEMATOLOGY Negative for prolonged bleeding, bruising easily, and swollen nodes. ENDOCRINE: Negative for cold or heat intolerance, polyuria, polydipsia and goiter. NEURO: negative for tremor, gait imbalance, syncope and seizures. The remainder of the review of systems is noncontributory.   Physical Exam: BP (!) 145/90 (BP Location: Left Arm, Patient Position: Sitting, Cuff Size: Large)   Pulse (!) 114   Temp 98.3 F (36.8 C) (Oral)   Ht 5' 10.5" (1.791 m)   Wt (!) 230 lb 1.6 oz (104.4 kg)   BMI 32.55 kg/m  GENERAL: The patient is AO x3, in no acute distress. Patient in a wheelchair. Obese. HEENT: Head is normocephalic and atraumatic. EOMI are intact. Mouth is well hydrated and without lesions. NECK: Supple. No masses LUNGS: Clear to auscultation. No presence of rhonchi/wheezing/rales. Adequate chest expansion HEART: RRR, normal s1 and  s2. ABDOMEN: Soft, nontender, no guarding, no peritoneal signs, and nondistended. BS +. No masses. EXTREMITIES: Without any cyanosis, clubbing, rash, lesions or edema. Has a brace in left leg. NEUROLOGIC: AOx3, no focal motor deficit. SKIN: no jaundice, no rashes   Imaging/Labs: as above  I personally reviewed and interpreted the available labs, imaging and endoscopic files.  Impression and Plan: [name] is a 54 y.o. 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  Regarding her diarrhea, she  has had chronic symptoms since her surgery.  Her symptomatology could be related to postoperative changes in her colonic motility that are beginning to normalize at this moment as her symptoms have been less frequent more recently.  She is not taking any medications that could potentially lead to diarrhea.  However, at this moment, will be important to perform colonoscopy to evaluate microscopic colitis with random colonic biopsies.  Also, she is due for surveillance colonoscopy given her history of colon cancer (recommendation to repeat 6 to 12 months post surgery).  She will benefit from starting Metamucil to improve the bulk of her stools.  If she has any repeat episodes of diarrhea, she can try taking Colestid or Imodium as needed.  On the other hand, she had iron deficiency anemia in the past which could be related to her sigmoid cancer, since her anemia was improving more recently.  We will obtain repeat blood work-up to check her iron stores and she will need to follow-up with her hematologist and continue with iron supplementation based on their expertise.  - Schedule surveillance colonoscopy with random colon biopsies - Start Metamucil fiber supplements daily to  increase stool bulk - Can take colestid as needed if diarrhea recurs - Check CBC, CMP and iron stores - Continue follow up with [name] and with iron supplementation per their service  All questions were answered.      Harvel Quale, MD Gastroenterology and Hepatology [facility]

## 2019-11-26 ENCOUNTER — Telehealth (INDEPENDENT_AMBULATORY_CARE_PROVIDER_SITE_OTHER): Payer: Self-pay | Admitting: Gastroenterology

## 2019-11-26 NOTE — Telephone Encounter (Signed)
I called the patient today to explain her the results of her blood testing that she did not pick up the phone. I called her daughter to inform her that her hemoglobin has improved compared to most recent as well as her iron stores. However, she will need to undergo schedule colonoscopy as part of the surveillance of her colon cancer. I also told her that she needs to follow with her oncologist regarding her history of colon cancer.  Harvel Quale, MD Gastroenterology and Hepatology [facility]

## 2019-11-27 ENCOUNTER — Emergency Department: Payer: Medicare Other

## 2019-11-27 ENCOUNTER — Encounter: Payer: Self-pay | Admitting: Emergency Medicine

## 2019-11-27 ENCOUNTER — Other Ambulatory Visit: Payer: Self-pay

## 2019-11-27 DIAGNOSIS — M7989 Other specified soft tissue disorders: Secondary | ICD-10-CM | POA: Diagnosis not present

## 2019-11-27 DIAGNOSIS — Z79899 Other long term (current) drug therapy: Secondary | ICD-10-CM | POA: Diagnosis not present

## 2019-11-27 DIAGNOSIS — Z85038 Personal history of other malignant neoplasm of large intestine: Secondary | ICD-10-CM | POA: Diagnosis not present

## 2019-11-27 DIAGNOSIS — E1142 Type 2 diabetes mellitus with diabetic polyneuropathy: Secondary | ICD-10-CM | POA: Diagnosis not present

## 2019-11-27 DIAGNOSIS — Z87891 Personal history of nicotine dependence: Secondary | ICD-10-CM | POA: Diagnosis not present

## 2019-11-27 DIAGNOSIS — I10 Essential (primary) hypertension: Secondary | ICD-10-CM | POA: Insufficient documentation

## 2019-11-27 DIAGNOSIS — L03115 Cellulitis of right lower limb: Secondary | ICD-10-CM | POA: Diagnosis not present

## 2019-11-27 DIAGNOSIS — Z794 Long term (current) use of insulin: Secondary | ICD-10-CM | POA: Insufficient documentation

## 2019-11-27 DIAGNOSIS — E1165 Type 2 diabetes mellitus with hyperglycemia: Secondary | ICD-10-CM | POA: Insufficient documentation

## 2019-11-27 LAB — COMPREHENSIVE METABOLIC PANEL
ALT: 17 U/L (ref 0–44)
AST: 17 U/L (ref 15–41)
Albumin: 3.9 g/dL (ref 3.5–5.0)
Alkaline Phosphatase: 121 U/L (ref 38–126)
Anion gap: 10 (ref 5–15)
BUN: 19 mg/dL (ref 6–20)
CO2: 24 mmol/L (ref 22–32)
Calcium: 9.2 mg/dL (ref 8.9–10.3)
Chloride: 102 mmol/L (ref 98–111)
Creatinine, Ser: 1.46 mg/dL — ABNORMAL HIGH (ref 0.44–1.00)
GFR calc Af Amer: 46 mL/min — ABNORMAL LOW (ref >60)
GFR calc non Af Amer: 40 mL/min — ABNORMAL LOW (ref >60)
Glucose, Bld: 244 mg/dL — ABNORMAL HIGH (ref 70–99)
Potassium: 4.2 mmol/L (ref 3.5–5.1)
Sodium: 136 mmol/L (ref 135–145)
Total Bilirubin: 0.5 mg/dL (ref 0.3–1.2)
Total Protein: 7.4 g/dL (ref 6.5–8.1)

## 2019-11-27 LAB — CBC WITH DIFFERENTIAL/PLATELET
Abs Immature Granulocytes: 0.03 10*3/uL (ref 0.00–0.07)
Basophils Absolute: 0.1 10*3/uL (ref 0.0–0.1)
Basophils Relative: 1 %
Eosinophils Absolute: 0.3 10*3/uL (ref 0.0–0.5)
Eosinophils Relative: 2 %
HCT: 32.6 % — ABNORMAL LOW (ref 36.0–46.0)
Hemoglobin: 10.2 g/dL — ABNORMAL LOW (ref 12.0–15.0)
Immature Granulocytes: 0 %
Lymphocytes Relative: 31 %
Lymphs Abs: 3.4 10*3/uL (ref 0.7–4.0)
MCH: 27.6 pg (ref 26.0–34.0)
MCHC: 31.3 g/dL (ref 30.0–36.0)
MCV: 88.1 fL (ref 80.0–100.0)
Monocytes Absolute: 0.7 10*3/uL (ref 0.1–1.0)
Monocytes Relative: 6 %
Neutro Abs: 6.4 10*3/uL (ref 1.7–7.7)
Neutrophils Relative %: 60 %
Platelets: 253 10*3/uL (ref 150–400)
RBC: 3.7 MIL/uL — ABNORMAL LOW (ref 3.87–5.11)
RDW: 16.7 % — ABNORMAL HIGH (ref 11.5–15.5)
WBC: 10.8 10*3/uL — ABNORMAL HIGH (ref 4.0–10.5)
nRBC: 0 % (ref 0.0–0.2)

## 2019-11-27 LAB — LACTIC ACID, PLASMA: Lactic Acid, Venous: 1.9 mmol/L (ref 0.5–1.9)

## 2019-11-27 NOTE — ED Triage Notes (Addendum)
Pt presents to ED accompanied by daughter with c/o blackening to bottom of her left foot. 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 Pt daughter reports pt lives alone and daughter noted that the pt has not taken wrap off her foot / ankle since May. Pt now has blackened areas to the bottom of her foot. Pt denies pain. No open wounds noted at this time. Denies any other pain or issues at this time. Pt appears very unaware of current physical condition.

## 2019-11-28 ENCOUNTER — Emergency Department
Admission: EM | Admit: 2019-11-28 | Discharge: 2019-11-28 | Disposition: A | Discharge status: Home / Self Care | Payer: Medicare Other | Attending: Emergency Medicine | Admitting: Emergency Medicine

## 2019-11-28 ENCOUNTER — Other Ambulatory Visit: Payer: Self-pay

## 2019-11-28 ENCOUNTER — Encounter (HOSPITAL_COMMUNITY)
Admission: RE | Admit: 2019-11-28 | Discharge: 2019-11-28 | Disposition: A | Discharge status: Home / Self Care | Payer: Medicare Other | Source: Ambulatory Visit | Attending: Gastroenterology | Admitting: Gastroenterology

## 2019-11-28 DIAGNOSIS — L03115 Cellulitis of right lower limb: Secondary | ICD-10-CM

## 2019-11-28 HISTORY — DX: Charcot's joint, left ankle and foot: M14.672

## 2019-11-28 MED ORDER — CEPHALEXIN 500 MG PO CAPS
500.0000 mg | ORAL_CAPSULE | Freq: Three times a day (TID) | ORAL | 0 refills | Status: AC
Start: 2019-11-28 — End: 2019-12-08

## 2019-11-28 MED ORDER — BACITRACIN ZINC 500 UNIT/GM EX OINT
TOPICAL_OINTMENT | Freq: Once | CUTANEOUS | Status: AC
  Administered 2019-11-28: 1 via TOPICAL
  Filled 2019-11-28: qty 0.9

## 2019-11-28 NOTE — Discharge Instructions (Signed)
The black skin on the sole of your left foot looks like a just dead skin.  It peels off and there is good white skin underneath of it.  I would very gently scrub on it with some gauze and Epsom salts or something similar.  Be careful not to rub all the skin off so that it will start bleeding and get infected.  Follow-up with your podiatrist Friday as planned.  The area on the right ankle I will give you some antibiotics for.  Some Keflex 3 times a day.  Keep it covered.  If the redness goes outside the line we drew return here.  Otherwise have your doctor see it on Friday.  Please do not hesitate to return for any problems.

## 2019-11-28 NOTE — ED Provider Notes (Signed)
[facility] Emergency Department Provider Note   ____________________________________________   First MD Initiated Contact with Patient 11/28/19 [phone]     (approximate)  I have reviewed the triage vital signs and the nursing notes.   HISTORY  Chief Complaint Foot Injury  HPI [name] is a 54 y.o. female patient complains of black on the sole of the left foot.  It is not painful and she has no sensation in that foot.  She reports she had a wrap on the foot and kept it on there for several days while she was wearing her foot brace.  She noticed the black skin today.  She also has an area about 5 mm in diameter on the medial right ankle which is 6 scraped and beginning to form a small ulcer surrounded by another 2 cm of erythema all the way around it.  This is not really tender either.  Not hard it is just mildly pink.  There is no drainage currently.  Patient reports she does have a appointment with her podiatrist on Friday.  Gets tomorrow.         Past Medical History:  Diagnosis Date  . Charcot's joint of foot, left   . Depression   . Diabetes mellitus without complication ([place])   . Hyperlipidemia   . Hypertension   . Neuromuscular disorder ([place])   . Schizo affective schizophrenia ([place])    abilify and pazil    Patient Active Problem List   Diagnosis Date Noted  . Chronic diarrhea 11/25/2019  . History of colon cancer 11/25/2019  . Heart palpitations 08/19/2019  . Iron deficiency anemia 07/25/2019  . Diabetes mellitus with polyneuropathy ([place]) 07/17/2019  . S/P partial colectomy 07/17/2019  . Acute kidney injury ([place]) 07/17/2019  . 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[place]) 05/28/2019  . Anemia   . Hyponatremia 05/23/2019  . Neuropathic foot ulcer, left, with fat layer exposed ([place]) 02/05/2019  . Hyperkalemia 02/05/2019  . Persistent microalbuminuria associated with type 2 diabetes mellitus ([place]) 05/16/2018  . Type 2 diabetes mellitus  with hyperglycemia, with long-term current use of insulin ([place]) 05/16/2018  . Morbid obesity ([place]) 05/10/2017  . Long-term use of high-risk medication 05/10/2017  . Hyperlipidemia associated with type 2 diabetes mellitus ([place]) 06/27/2016  . Hammer toe of second toe of right foot 04/20/2016  . Essential hypertension 06/29/2015  . Schizoaffective disorder ([place]) 06/29/2015  . DM (diabetes mellitus), type 2, 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[place]) 84/69/6295  . Polyneuropathy 04/02/2014    Past Surgical History:  Procedure Laterality Date  . CHOLECYSTECTOMY    . COLONOSCOPY WITH PROPOFOL N/A 05/26/2019   Procedure: COLONOSCOPY WITH PROPOFOL;  Surgeon: Toledo, Benay Pike, MD;  Location: [facility] ENDOSCOPY;  Service: Gastroenterology;  Laterality: N/A;  . ESOPHAGOGASTRODUODENOSCOPY (EGD) WITH PROPOFOL N/A 05/26/2019   Procedure: ESOPHAGOGASTRODUODENOSCOPY (EGD) WITH PROPOFOL;  Surgeon: Toledo, Benay Pike, MD;  Location: [facility] ENDOSCOPY;  Service: Gastroenterology;  Laterality: N/A;  . IRRIGATION AND DEBRIDEMENT FOOT Right 02/26/2016   Procedure: RIGHT 2ND TOE DEBRIDEMENT;  Surgeon: Samara Deist, DPM;  Location: [facility] ORS;  Service: Podiatry;  Laterality: Right;  . IRRIGATION AND DEBRIDEMENT FOOT Left 12/13/2018   Procedure: IRRIGATION AND DEBRIDEMENT FOOT;  Surgeon: Caroline More, DPM;  Location: [facility] ORS;  Service: Podiatry;  Laterality: Left;    Prior to Admission medications   Medication Sig Start Date End Date Taking? Authorizing Provider  acetaminophen (TYLENOL) 325 MG tablet Take 2 tablets (650 mg total) by mouth every 6 (six) hours  as needed for mild pain. 06/04/19   Nicole Kindred A, DO  ARIPiprazole (ABILIFY) 10 MG tablet Take 10 mg by mouth at bedtime.     [provider]  ascorbic acid (VITAMIN C) 500 MG tablet Take 500 mg by mouth daily.    [provider]  aspirin EC 81 MG tablet Take 81 mg by mouth daily.     [provider]  atorvastatin (LIPITOR) 40 MG  tablet Take 1 tablet (40 mg total) by mouth daily. 02/20/19   Karamalegos, Devonne Doughty, DO  Blood Glucose Monitoring Suppl (ONETOUCH VERIO) w/Device KIT Use to check blood sugar up to 3 x daily 09/11/19   Parks Ranger, Devonne Doughty, DO  buPROPion (WELLBUTRIN XL) 300 MG 24 hr tablet Take 300 mg by mouth daily. 07/23/19   [provider]  cephALEXin (KEFLEX) 500 MG capsule Take 1 capsule (500 mg total) by mouth 3 (three) times daily for 10 days. 11/28/19 12/08/19  Nena Polio, MD  colestipol (COLESTID) 1 g tablet TAKE (2) TABLETS BY MOUTH TWICE DAILY. Patient taking differently: Take 2 g by mouth 2 (two) times daily.  11/18/19   Karamalegos, Devonne Doughty, DO  Ferrous Sulfate 27 MG TABS Take 27 mg by mouth daily.     [provider]  furosemide (LASIX) 40 MG tablet Take 1 tablet (40 mg total) by mouth daily. 08/14/19   Karamalegos, Devonne Doughty, DO  gabapentin (NEURONTIN) 300 MG capsule Take 1 capsule (300 mg total) by mouth at bedtime. 08/14/19   Karamalegos, Devonne Doughty, DO  hydrochlorothiazide (HYDRODIURIL) 12.5 MG tablet Take 2 tablets (25 mg total) by mouth daily. Patient taking differently: Take 12.5 mg by mouth daily.  07/15/19   Karamalegos, Devonne Doughty, DO  insulin glargine (LANTUS) 100 UNIT/ML Solostar Pen Inject 24 Units into the skin daily.  10/02/19   [provider]  Lancets Glory Rosebush ULTRASOFT) lancets Use to check blood sugar up to 3 x daily 03/18/19   [name], DO  Loperamide HCl (IMODIUM PO) Take 1 capsule by mouth daily as needed (diarrhea). Patient reports that this is a liquid form.    [provider]  metFORMIN (GLUCOPHAGE) 1000 MG tablet Take 1,000 mg by mouth daily.  10/02/19   [provider]  metoprolol succinate (TOPROL-XL) 50 MG 24 hr tablet Take 1 tablet (50 mg total) by mouth daily. Take with or immediately following a meal. 08/15/19   Karamalegos, Devonne Doughty, DO  ondansetron (ZOFRAN) 4 MG tablet Take 1 tablet (4 mg total) by  mouth every 6 (six) hours. Patient taking differently: Take 4 mg by mouth every 8 (eight) hours as needed for nausea or vomiting.  05/16/19   W[place]st, [place], Gabriela-C  ONETOUCH VERIO test strip Check blood sugar up to 5 x daily as needed 04/17/19   [name], DO  traZODone (DESYREL) 50 MG tablet TAKE 1 TABLET(50 MG) BY MOUTH AT BEDTIME AS NEEDED FOR SLEEP Patient taking differently: Take 50 mg by mouth at bedtime as needed for sleep.  10/10/19   Karamalegos, Devonne Doughty, DO  vitamin B-12 1000 MCG tablet Take 1 tablet (1,000 mcg total) by mouth daily. 06/05/19   Ezekiel Slocumb, DO    Allergies Patient has no known allergies.  Family History  Problem Relation Age of Onset  . Breast cancer Cousin        maternal cousin  . Hypertension Mother   . Hyperthyroidism Mother   . Dementia Mother   . Prostate cancer  Father   . Diabetes Maternal Grandmother   . Hypertension Maternal Grandmother   . Cancer Maternal Grandmother        unknown type  . Diabetes Maternal Grandfather   . Hypertension Maternal Grandfather   . Diabetes Paternal Grandmother   . Hypertension Paternal Grandmother   . Diabetes Paternal Grandfather   . Hypertension Paternal Grandfather     Social History Social History   Tobacco Use  . Smoking status: Former Smoker    Packs/day: 3.00    Years: 30.00    Pack years: 90.00    Types: Cigarettes    Quit date: 05/02/2009    Years since quitting: 10.5  . Smokeless tobacco: Never Used  . Tobacco comment: Pt reported  quitting in 2011  Vaping Use  . Vaping Use: Never used  Substance Use Topics  . Alcohol use: No  . Drug use: No    Review of Systems  Constitutional: No fever/chills Eyes: No visual changes. ENT: No sore throat. Cardiovascular: Denies chest pain. Respiratory: Denies shortness of breath. Gastrointestinal: No abdominal pain.  No nausea, no vomiting.  No diarrhea.  No constipation. Genitourinary: Negative for dysuria. Musculoskeletal:  Negative for back pain. Skin: Negative for rash. Neurological: Negative for headaches, focal weakness   ____________________________________________   PHYSICAL EXAM:  VITAL SIGNS: ED Triage Vitals  Enc Vitals Group     BP 11/27/19 2241 (!) 135/70     Pulse Rate 11/27/19 2241 85     Resp 11/27/19 2241 18     Temp 11/27/19 2241 98.4 F (36.9 C)     Temp Source 11/27/19 2241 Oral     SpO2 11/27/19 2241 99 %     Weight 11/27/19 2244 (!) 230 lb (104.3 kg)     Height 11/27/19 2244 '5\' 10"'  (1.778 m)     Head Circumference --      Peak Flow --      Pain Score 11/27/19 2300 0     Pain Loc --      Pain Edu? --      Excl. in [place]? --     Constitutional: Alert and oriented. Well appearing and in no acute distress. Eyes: Conjunctivae are normal.  Head: Atraumatic. Nose: No congestion/rhinnorhea. Mouth/Throat: Mucous membranes are moist.  Oropharynx non-erythematous. Neck: No stridor.  Cardiovascular: Normal rate, regular rhythm.  Fair peripheral circulation with fair peripheral capillary refill Respiratory: Normal respiratory effort.  No retractions. Lungs CTAB. Gastrointestinal: Soft and nontender. No distention. No abdominal bruits. No CVA tenderness. Musculoskeletal: No lower extremity tenderness nor edema.   Neurologic:  Normal speech and language. No gross focal neurologic deficits are appreciated. No gait instability. Skin:  Skin is warm, dry and intact except over the right ankle as noted.  There is some sound surrounding cellulitis there.  The skin on the sole of the left foot is black.  However it is very very thick and when I peel off the outer layer of the blackness peels off and there is normal intact skin underneath. Psychiatric: Mood and affect are normal. Speech and behavior are normal.  ____________________________________________   LABS (all labs ordered are listed, but only abnormal results are displayed)  Labs Reviewed  COMPREHENSIVE METABOLIC PANEL - Abnormal;  Notable for the following components:      Result Value   Glucose, Bld 244 (*)    Creatinine, Ser 1.46 (*)    GFR calc non Af Amer 40 (*)    GFR calc Af Amer 46 (*)  All other components within normal limits  CBC WITH DIFFERENTIAL/PLATELET - Abnormal; Notable for the following components:   WBC 10.8 (*)    RBC 3.70 (*)    Hemoglobin 10.2 (*)    HCT 32.6 (*)    RDW 16.7 (*)    All other components within normal limits  LACTIC ACID, PLASMA  LACTIC ACID, PLASMA   ____________________________________________  EKG  ____________________________________________  RADIOLOGY  ED MD interpretation: Tray read by radiology reviewed by me shows chronic neuropathic changes no obvious acute problems  Official radiology report(s): DG Foot Complete Left  Result Date: 11/27/2019 CLINICAL DATA:  54 year old female with discoloration of the left foot. Chronic neuropathy. EXAM: LEFT FOOT - COMPLETE 3+ VIEW COMPARISON:  Left foot radiographs 09/17/2019. FINDINGS: Chronic neuropathic changes to the left midfoot with tarsal bone collapse and disorganization. Superimposed heterogeneous osteopenia, but no osteolysis, acute fracture or dislocation is identified. Calcified peripheral vascular disease. Increased soft tissue swelling in the distal foot since May. No soft tissue gas. IMPRESSION: Chronic neuropathic changes with some increased soft tissue swelling since May but no acute osseous abnormality identified. Electronically Signed   By: Genevie Ann M.D.   On: 11/27/2019 23:34    ____________________________________________   PROCEDURES  Procedure(s) performed (including Critical Care):  Procedures   ____________________________________________   INITIAL IMPRESSION / ASSESSMENT AND PLAN / ED COURSE I discussed with the patient the need for trying to very gently debride with some gauze soaked in Epsom salts or something similar and to get the black skin off the sole of her foot.  I have cautioned  her to be very careful so that she does not rub the foot raw.  I have asked her  to follow-up with her doctor the podiatrist tomorrow.  He can take a look at both the ulcer and make sure its healing.  We have outlined it with marker this make sure the cellulitis is getting worse and to see how she is doing with the debridement of the black skin off of her other foot.               ____________________________________________   FINAL CLINICAL IMPRESSION(S) / ED DIAGNOSES  Final diagnoses:  Cellulitis of right ankle     ED Discharge Orders         Ordered    cephALEXin (KEFLEX) 500 MG capsule  3 times daily     Discontinue  Reprint     11/28/19 9507           Note:  This document was prepared using Dragon voice recognition software and may include unintentional dictation errors.    Nena Polio, MD 11/28/19 [id number]

## 2019-12-01 NOTE — Progress Notes (Deleted)
[place]  [phone] [phone]  ID: [name] OB: 21-Jun-1965  MR#: [id number]  KDX#:[id number]  Patient Care Team: [name], DO as PCP - General (Family Medicine) Floria Raveling, MD as Referring Physician (Psychiatry) [place], Virl Diamond, [facility] as Pharmacist (Pharmacist) Edrick Kins, MD as Rounding Team (Internal Medicine) Vanita Ingles, RN as Case Manager ([place]) Greg Cutter, LCSW as Social Worker (Licensed Clinical Social Worker) Grayland Ormond, Kathlene November, MD as Consulting Physician (Oncology)  CHIEF COMPLAINT: 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  INTERVAL HISTORY: Patient returns to clinic today for repeat laboratory work and further evaluation.  She continues to have chronic weakness and fatigue, but this is improving.  Over the past 24 to 48 hours she has noticed an increase in diarrhea.  She has no neurologic complaints.  She denies any recent fevers or illnesses. She has no chest pain, shortness of breath, cough, or hemoptysis.  She denies any nausea, vomiting, or constipation.  She has no melena or hematochezia.  She has no urinary complaints.  Patient offers no further specific complaints today.  REVIEW OF SYSTEMS:   Review of Systems  Constitutional: Positive for malaise/fatigue. Negative for fever and weight loss.  Respiratory: Negative.  Negative for cough, hemoptysis and shortness of breath.   Cardiovascular: Negative.  Negative for chest pain and leg swelling.  Gastrointestinal: Positive for diarrhea. Negative for abdominal pain, blood in stool, melena and nausea.  Genitourinary: Negative.  Negative for dysuria.  Musculoskeletal: Negative.  Negative for back pain.  Skin: Negative.  Negative for rash.  Neurological: Positive for weakness. Negative for dizziness, sensory change, focal weakness and headaches.  Psychiatric/Behavioral: Negative.  The patient is  not nervous/anxious.     As per HPI. Otherwise, a complete review of systems is negative.  PAST MEDICAL HISTORY: Past Medical History:  Diagnosis Date  . Charcot's joint of foot, left   . Depression   . Diabetes mellitus without complication ([place])   . Hyperlipidemia   . Hypertension   . Neuromuscular disorder ([place])   . Schizo affective schizophrenia ([place])    abilify and pazil    PAST SURGICAL HISTORY: Past Surgical History:  Procedure Laterality Date  . CHOLECYSTECTOMY    . COLONOSCOPY WITH PROPOFOL N/A 05/26/2019   Procedure: COLONOSCOPY WITH PROPOFOL;  Surgeon: Toledo, Benay Pike, MD;  Location: [facility] ENDOSCOPY;  Service: Gastroenterology;  Laterality: N/A;  . ESOPHAGOGASTRODUODENOSCOPY (EGD) WITH PROPOFOL N/A 05/26/2019   Procedure: ESOPHAGOGASTRODUODENOSCOPY (EGD) WITH PROPOFOL;  Surgeon: Toledo, Benay Pike, MD;  Location: [facility] ENDOSCOPY;  Service: Gastroenterology;  Laterality: N/A;  . IRRIGATION AND DEBRIDEMENT FOOT Right 02/26/2016   Procedure: RIGHT 2ND TOE DEBRIDEMENT;  Surgeon: Samara Deist, DPM;  Location: [facility] ORS;  Service: Podiatry;  Laterality: Right;  . IRRIGATION AND DEBRIDEMENT FOOT Left 12/13/2018   Procedure: IRRIGATION AND DEBRIDEMENT FOOT;  Surgeon: Caroline More, DPM;  Location: [facility] ORS;  Service: Podiatry;  Laterality: Left;    FAMILY HISTORY: Family History  Problem Relation Age of Onset  . Breast cancer Cousin        maternal cousin  . Hypertension Mother   . Hyperthyroidism Mother   . Dementia Mother   . Prostate cancer Father   . Diabetes Maternal Grandmother   . Hypertension Maternal Grandmother   . Cancer Maternal Grandmother        unknown type  . Diabetes Maternal Grandfather   . Hypertension Maternal Grandfather   . Diabetes Paternal Grandmother   .  Hypertension Paternal Grandmother   . Diabetes Paternal Grandfather   . Hypertension Paternal Grandfather     ADVANCED DIRECTIVES (Y/N):  N  HEALTH MAINTENANCE: Social History    Tobacco Use  . Smoking status: Former Smoker    Packs/day: 3.00    Years: 30.00    Pack years: 90.00    Types: Cigarettes    Quit date: 05/02/2009    Years since quitting: 10.5  . Smokeless tobacco: Never Used  . Tobacco comment: Pt reported  quitting in 2011  Vaping Use  . Vaping Use: Never used  Substance Use Topics  . Alcohol use: No  . Drug use: No     Colonoscopy:  PAP:  Bone density:  Lipid panel:  No Known Allergies  Current Outpatient Medications  Medication Sig Dispense Refill  . acetaminophen (TYLENOL) 325 MG tablet Take 2 tablets (650 mg total) by mouth every 6 (six) hours as needed for mild pain.    . ARIPiprazole (ABILIFY) 10 MG tablet Take 10 mg by mouth at bedtime.     [name] ascorbic acid (VITAMIN C) 500 MG tablet Take 500 mg by mouth daily.    [name] aspirin EC 81 MG tablet Take 81 mg by mouth daily.     [name] atorvastatin (LIPITOR) 40 MG tablet Take 1 tablet (40 mg total) by mouth daily. 90 tablet 3  . Blood Glucose Monitoring Suppl (ONETOUCH VERIO) w/Device KIT Use to check blood sugar up to 3 x daily 1 kit 0  . buPROPion (WELLBUTRIN XL) 300 MG 24 hr tablet Take 300 mg by mouth daily.    . cephALEXin (KEFLEX) 500 MG capsule Take 1 capsule (500 mg total) by mouth 3 (three) times daily for 10 days. 30 capsule 0  . colestipol (COLESTID) 1 g tablet TAKE (2) TABLETS BY MOUTH TWICE DAILY. (Patient taking differently: Take 2 g by mouth 2 (two) times daily. ) 120 tablet 0  . Ferrous Sulfate 27 MG TABS Take 27 mg by mouth daily.     . furosemide (LASIX) 40 MG tablet Take 1 tablet (40 mg total) by mouth daily. 90 tablet 1  . gabapentin (NEURONTIN) 300 MG capsule Take 1 capsule (300 mg total) by mouth at bedtime. 90 capsule 1  . hydrochlorothiazide (HYDRODIURIL) 12.5 MG tablet Take 2 tablets (25 mg total) by mouth daily. (Patient taking differently: Take 12.5 mg by mouth daily. ) 90 tablet 1  . insulin glargine (LANTUS) 100 UNIT/ML Solostar Pen Inject 24 Units into the skin daily.      . Lancets (ONETOUCH ULTRASOFT) lancets Use to check blood sugar up to 3 x daily 100 each 12  . Loperamide HCl (IMODIUM PO) Take 1 capsule by mouth daily as needed (diarrhea). Patient reports that this is a liquid form.    . metFORMIN (GLUCOPHAGE) 1000 MG tablet Take 1,000 mg by mouth daily.     . metoprolol succinate (TOPROL-XL) 50 MG 24 hr tablet Take 1 tablet (50 mg total) by mouth daily. Take with or immediately following a meal. 90 tablet 0  . ondansetron (ZOFRAN) 4 MG tablet Take 1 tablet (4 mg total) by mouth every 6 (six) hours. (Patient taking differently: Take 4 mg by mouth every 8 (eight) hours as needed for nausea or vomiting. ) 12 tablet 0  . ONETOUCH VERIO test strip Check blood sugar up to 5 x daily as needed 450 each 3  . traZODone (DESYREL) 50 MG tablet TAKE 1 TABLET(50 MG) BY MOUTH AT BEDTIME AS NEEDED  FOR SLEEP (Patient taking differently: Take 50 mg by mouth at bedtime as needed for sleep. ) 30 tablet 2  . vitamin B-12 1000 MCG tablet Take 1 tablet (1,000 mcg total) by mouth daily.     No current facility-administered medications for this visit.    OBJECTIVE: There were no vitals filed for this visit.   There is no height or weight on file to calculate BMI.    ECOG FS:1 - Symptomatic but completely ambulatory  General: Well-developed, well-nourished, no acute distress. Eyes: Pink conjunctiva, anicteric sclera. HEENT: Normocephalic, moist mucous membranes. Lungs: No audible wheezing or coughing. Heart: Regular rate and rhythm. Abdomen: Soft, nontender, no obvious distention. Musculoskeletal: No edema, cyanosis, or clubbing. Neuro: Alert, answering all questions appropriately. Cranial nerves grossly intact. Skin: No rashes or petechiae noted. Psych: Normal affect.   LAB RESULTS:  Lab Results  Component Value Date   NA 136 11/27/2019   K 4.2 11/27/2019   CL 102 11/27/2019   CO2 24 11/27/2019   GLUCOSE 244 (H) 11/27/2019   BUN 19 11/27/2019   CREATININE 1.46  (H) 11/27/2019   CALCIUM 9.2 11/27/2019   PROT 7.4 11/27/2019   ALBUMIN 3.9 11/27/2019   AST 17 11/27/2019   ALT 17 11/27/2019   ALKPHOS 121 11/27/2019   BILITOT 0.5 11/27/2019   GFRNONAA 40 (L) 11/27/2019   GFRAA 46 (L) 11/27/2019    Lab Results  Component Value Date   WBC 10.8 (H) 11/27/2019   NEUTROABS 6.4 11/27/2019   HGB 10.2 (L) 11/27/2019   HCT 32.6 (L) 11/27/2019   MCV 88.1 11/27/2019   PLT 253 11/27/2019   Lab Results  Component Value Date   IRON 57 11/25/2019   TIBC CANCELED 11/25/2019   IRONPCTSAT 13 09/10/2019   Lab Results  Component Value Date   FERRITIN 198 11/25/2019     STUDIES: DG Foot Complete Left  Result Date: 11/27/2019 CLINICAL DATA:  54 year old female with discoloration of the left foot. Chronic neuropathy. EXAM: LEFT FOOT - COMPLETE 3+ VIEW COMPARISON:  Left foot radiographs 09/17/2019. FINDINGS: Chronic neuropathic changes to the left midfoot with tarsal bone collapse and disorganization. Superimposed heterogeneous osteopenia, but no osteolysis, acute fracture or dislocation is identified. Calcified peripheral vascular disease. Increased soft tissue swelling in the distal foot since May. No soft tissue gas. IMPRESSION: Chronic neuropathic changes with some increased soft tissue swelling since May but no acute osseous abnormality identified. Electronically Signed   By: Genevie Ann M.D.   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  PLAN:    1. 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 Final pathology and imaging reviewed independently.  Patient had a T2, N0, M0 lesion which does not require adjuvant chemotherapy.  No further interventions are needed.  Her most recent CEA on July 25, 2019 was reported at 1.4.  Continue follow-up with surgery as indicated.  Patient will require repeat colonoscopy in 6 to 12 months from partial colectomy.  2.  Iron  deficiency anemia: Patient's hemoglobin continues to slowly trend up and is 9.4.  Although her ferritin is 74, her iron saturation continues to be at the low level of normal at 13%.  Because of this, patient will return to clinic later this week to receive 1 infusion of 510 mg IV Feraheme.  No further intervention is needed.  Return to clinic in 2 months with repeat laboratory work and further evaluation.  3.  Acute on chronic renal insufficiency: Patient's creatinine has trended up slightly, possibly secondary to recent bout of diarrhea.  Encourage increased fluid intake. 4.  Hyperkalemia: Resolved. 5.  Diarrhea: Recommended increasing fluids and OTC Imodium.  Patient expressed understanding and was in agreement with this plan. She also understands that She can call clinic at any time with any questions, concerns, or complaints.   Cancer Staging Adenocarcinoma of sigmoid colon [facility]) Staging form: Colon and Rectum, AJCC 8th Edition - Clinical stage from 06/07/2019: Stage I (cT2, cN0, cM0) - Signed by Lloyd Huger, MD on 06/07/2019   Lloyd Huger, MD   12/01/2019 9:01 AM

## 2019-12-02 ENCOUNTER — Inpatient Hospital Stay: Payer: Medicaid Other

## 2019-12-02 ENCOUNTER — Inpatient Hospital Stay: Payer: Medicaid Other | Admitting: Oncology

## 2019-12-03 ENCOUNTER — Telehealth: Payer: Self-pay

## 2019-12-03 ENCOUNTER — Ambulatory Visit (INDEPENDENT_AMBULATORY_CARE_PROVIDER_SITE_OTHER): Payer: Medicare Other | Admitting: Licensed Clinical Social Worker

## 2019-12-03 DIAGNOSIS — E1169 Type 2 diabetes mellitus with other specified complication: Secondary | ICD-10-CM

## 2019-12-03 DIAGNOSIS — IMO0002 Reserved for concepts with insufficient information to code with codable children: Secondary | ICD-10-CM

## 2019-12-03 DIAGNOSIS — E785 Hyperlipidemia, unspecified: Secondary | ICD-10-CM

## 2019-12-03 DIAGNOSIS — F251 Schizoaffective disorder, depressive type: Secondary | ICD-10-CM

## 2019-12-03 DIAGNOSIS — I10 Essential (primary) hypertension: Secondary | ICD-10-CM

## 2019-12-03 DIAGNOSIS — G629 Polyneuropathy, unspecified: Secondary | ICD-10-CM

## 2019-12-03 DIAGNOSIS — M14672 Charcot's joint, left ankle and foot: Secondary | ICD-10-CM | POA: Diagnosis not present

## 2019-12-03 DIAGNOSIS — E1149 Type 2 diabetes mellitus with other diabetic neurological complication: Secondary | ICD-10-CM

## 2019-12-03 NOTE — Telephone Encounter (Signed)
Copied from [place] [phone]. Topic: General - Inquiry >> Dec 02, 2019  4:13 PM Percell Belt A wrote: Reason for CRM: pt called in and would like to know if Dr could pt in an order for a seated walker.  She does not have a certain that she would like it sent.  Just somewhere that her ins will cover  Best number [phone]

## 2019-12-03 NOTE — Telephone Encounter (Signed)
Typically I would not just guess or pick a company.  Please notify patient  Patient should be able to contact her insurance to find out where we can send the rx.  May be able to fax it to the insurance company and then they can arrange it. Or they can give her list of companies that can do the DME medical equipment.  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

## 2019-12-03 NOTE — Chronic Care Management (AMB) (Signed)
Chronic Care Management    Clinical Social Work Follow Up Note  12/03/2019 Name: [name] MRN: [id number] DOB: [birth year]  [name] is a 54 y.o. year old female who is a primary care patient of [name], [name]. The CCM team was consulted for assistance with Level of Care Concerns.   Review of patient status, including review of consultants reports, other relevant assessments, and collaboration with appropriate care team members and the patient's provider was performed as part of comprehensive patient evaluation and provision of chronic care management services.    SDOH (Social Determinants of Health) assessments performed: Yes    Outpatient Encounter Medications as of 12/03/2019  Medication Sig  . acetaminophen (TYLENOL) 325 MG tablet Take 2 tablets (650 mg total) by mouth every 6 (six) hours as needed for mild pain.  . ARIPiprazole (ABILIFY) 10 MG tablet Take 10 mg by mouth at bedtime.   [name] ascorbic acid (VITAMIN C) 500 MG tablet Take 500 mg by mouth daily.  [name] aspirin EC 81 MG tablet Take 81 mg by mouth daily.   [name] atorvastatin (LIPITOR) 40 MG tablet Take 1 tablet (40 mg total) by mouth daily.  . Blood Glucose Monitoring Suppl (ONETOUCH VERIO) w/Device KIT Use to check blood sugar up to 3 x daily  . buPROPion (WELLBUTRIN XL) 300 MG 24 hr tablet Take 300 mg by mouth daily.  . cephALEXin (KEFLEX) 500 MG capsule Take 1 capsule (500 mg total) by mouth 3 (three) times daily for 10 days.  . colestipol (COLESTID) 1 g tablet TAKE (2) TABLETS BY MOUTH TWICE DAILY. (Patient taking differently: Take 2 g by mouth 2 (two) times daily. )  . Ferrous Sulfate 27 MG TABS Take 27 mg by mouth daily.   . furosemide (LASIX) 40 MG tablet Take 1 tablet (40 mg total) by mouth daily.  [name] gabapentin (NEURONTIN) 300 MG capsule Take 1 capsule (300 mg total) by mouth at bedtime.  . hydrochlorothiazide (HYDRODIURIL) 12.5 MG tablet Take 2 tablets (25 mg total) by mouth daily. (Patient  taking differently: Take 12.5 mg by mouth daily. )  . insulin glargine (LANTUS) 100 UNIT/ML Solostar Pen Inject 24 Units into the skin daily.   . Lancets (ONETOUCH ULTRASOFT) lancets Use to check blood sugar up to 3 x daily  . Loperamide HCl (IMODIUM PO) Take 1 capsule by mouth daily as needed (diarrhea). Patient reports that this is a liquid form.  . metFORMIN (GLUCOPHAGE) 1000 MG tablet Take 1,000 mg by mouth daily.   . metoprolol succinate (TOPROL-XL) 50 MG 24 hr tablet Take 1 tablet (50 mg total) by mouth daily. Take with or immediately following a meal.  . ondansetron (ZOFRAN) 4 MG tablet Take 1 tablet (4 mg total) by mouth every 6 (six) hours. (Patient taking differently: Take 4 mg by mouth every 8 (eight) hours as needed for nausea or vomiting. )  . ONETOUCH VERIO test strip Check blood sugar up to 5 x daily as needed  . traZODone (DESYREL) 50 MG tablet TAKE 1 TABLET(50 MG) BY MOUTH AT BEDTIME AS NEEDED FOR SLEEP (Patient taking differently: Take 50 mg by mouth at bedtime as needed for sleep. )  . vitamin B-12 1000 MCG tablet Take 1 tablet (1,000 mcg total) by mouth daily.   No facility-administered encounter medications on file as of 12/03/2019.     Goals Addressed    .  SW- "I want to manage my health conditions and get better." (pt-stated)  Current Barriers:  . Financial constraints related to managing health care within the home . Limited social support . ADL IADL limitations . Social Isolation . Inability to perform ADL's independently . Inability to perform IADL's independently  Clinical Social Work Clinical Goal(s):   [name] Over the next 120 days, patient/caregiver will work with SW to address concerns related to lack of support/resource connection. LCSW will assist patient in gaining additional support/resource connection and community resource education in order to maintain health and mental health appropriately  .  Over the next 120 days, patient will demonstrate improved  adherence to self care as evidenced by implementing healthy self-care into her daily routine such as: attending all medical appointments, deep breathing exercises, taking time for self-reflection, taking medications as prescribed, drinking water and daily exercise to improve mobility.  .  Over the next 120 days, patient will demonstrate improved health management independence as evidenced by implementing healthy self-care skills and positive support/resources into her daily routine to help cope with stressors and improve overall health and well-being  Interventions: . Discussed plans with patient for ongoing care management follow up and provided patient with direct contact information for care management team . Provided education and assistance to client regarding Advanced Directives. . A voluntary and extensive discussion about advanced care planning including explanation and discussion of advanced was undertaken with the patient and daughter.  Explanation regarding healthcare proxy and living will was reviewed and packet with forms with explanation of how to fill them out was given.   . Provided education to patient/caregiver regarding level of care options. . Provided education to patient/caregiver about Hospice and/or Palliative Care services . LCSW discussed coping skills for managing health care. SW used active and reflective listening, validated patient's daughters feelings/concerns, and provided emotional support. LCSW provided self-care education to caregiver help manage her mother's multiple health conditions and improve her overall mood. Daughter reports that patient is actively receiving mental health services at [facility] with [name].  . Daughter reports that she can no longer take care of patient in the home now that she is refusing care. Daughter agreeable to APS report. Patient went to ED yesterday. Patient's blood sugars and plus remain low. CCM LCSW and CCM RNCM collaborated with [name] daughter [name] today after receiving your message and she is agreeable to take patient to [name] for psych evaluation today on 05/22/19. We called APS and filed a report but they are requesting a signed letter from the PCP stating her competency level. They almost did not want to take the case without that. Pam and I challenged that several times with APS caseworker. CCM team sent in basket message to [name] asking if we could get a signed letter about our specific concerns in regards to her ability to make decisions for self, neglecting her physical and mental health care, refusing test at hospital on 05/21/2019, non-compliance issues with taking medications, taking baths, attending follow up appointments and not sharing information with family, etc. This letter can be emailed to [place] at [email or web address] UPDATE-Patient has since discharged from [facility] and is back home and taking appropriate care of herself without any urgent concerns. APS case is now closed.  . Patient missed her podiatrist appointment last week due to lack of stable transportation. Patient reports that she is in need of transportation assistance now as her daughter is unable to help out with this as much due to her son having health conditions. LCSW will complete  C3 referral for transportation assistance.  . Patient was reminded that she will need to contact her insurance to find out which agency accepts her insurance so that can receive a rolling walker with seat. Patient agreeable to contact her insurance and provided update to PCP once an agency has been decided so that he can complete RX.   Patient Self Care Activities:  . Attends all scheduled provider appointments . Calls provider office for new concerns or questions . Lacks social connections  Please see past updates related to this goal by clicking on the "Past Updates" button in the selected goal       Follow Up Plan: SW will  follow up with patient by phone over the next 60 days  [name], [employer], MSW, [email or web address] Phone: [phone]

## 2019-12-03 NOTE — Telephone Encounter (Signed)
Left message to call back since was not clear if it was confidential voice mail.

## 2019-12-04 ENCOUNTER — Other Ambulatory Visit (INDEPENDENT_AMBULATORY_CARE_PROVIDER_SITE_OTHER): Payer: Self-pay | Admitting: *Deleted

## 2019-12-04 NOTE — Telephone Encounter (Signed)
Attempted to call patient with PCP response- cell/home numbers- message call can not completed at this time- please try again later. Unable to leave message.

## 2019-12-05 ENCOUNTER — Emergency Department (HOSPITAL_COMMUNITY)
Admission: EM | Admit: 2019-12-05 | Discharge: 2019-12-06 | Disposition: A | Discharge status: Home / Self Care | Payer: Medicare Other | Attending: Emergency Medicine | Admitting: Emergency Medicine

## 2019-12-05 ENCOUNTER — Other Ambulatory Visit: Payer: Self-pay

## 2019-12-05 ENCOUNTER — Encounter (HOSPITAL_COMMUNITY): Payer: Self-pay

## 2019-12-05 DIAGNOSIS — R45851 Suicidal ideations: Secondary | ICD-10-CM | POA: Insufficient documentation

## 2019-12-05 DIAGNOSIS — Z743 Need for continuous supervision: Secondary | ICD-10-CM | POA: Diagnosis not present

## 2019-12-05 DIAGNOSIS — Z20822 Contact with and (suspected) exposure to covid-19: Secondary | ICD-10-CM | POA: Insufficient documentation

## 2019-12-05 DIAGNOSIS — R69 Illness, unspecified: Secondary | ICD-10-CM | POA: Diagnosis not present

## 2019-12-05 DIAGNOSIS — Z79899 Other long term (current) drug therapy: Secondary | ICD-10-CM | POA: Insufficient documentation

## 2019-12-05 DIAGNOSIS — Z87891 Personal history of nicotine dependence: Secondary | ICD-10-CM | POA: Insufficient documentation

## 2019-12-05 DIAGNOSIS — F331 Major depressive disorder, recurrent, moderate: Secondary | ICD-10-CM | POA: Diagnosis present

## 2019-12-05 DIAGNOSIS — E119 Type 2 diabetes mellitus without complications: Secondary | ICD-10-CM | POA: Diagnosis not present

## 2019-12-05 DIAGNOSIS — Z794 Long term (current) use of insulin: Secondary | ICD-10-CM | POA: Insufficient documentation

## 2019-12-05 DIAGNOSIS — I10 Essential (primary) hypertension: Secondary | ICD-10-CM | POA: Diagnosis not present

## 2019-12-05 LAB — ETHANOL: Alcohol, Ethyl (B): <10 mg/dL (ref <10)

## 2019-12-05 LAB — COMPREHENSIVE METABOLIC PANEL
ALT: 21 U/L (ref 0–44)
AST: 21 U/L (ref 15–41)
Albumin: 4 g/dL (ref 3.5–5.0)
Alkaline Phosphatase: 121 U/L (ref 38–126)
Anion gap: 13 (ref 5–15)
BUN: 18 mg/dL (ref 6–20)
CO2: 21 mmol/L — ABNORMAL LOW (ref 22–32)
Calcium: 9.3 mg/dL (ref 8.9–10.3)
Chloride: 102 mmol/L (ref 98–111)
Creatinine, Ser: 1.48 mg/dL — ABNORMAL HIGH (ref 0.44–1.00)
GFR calc Af Amer: 46 mL/min — ABNORMAL LOW (ref >60)
GFR calc non Af Amer: 39 mL/min — ABNORMAL LOW (ref >60)
Glucose, Bld: 295 mg/dL — ABNORMAL HIGH (ref 70–99)
Potassium: 4 mmol/L (ref 3.5–5.1)
Sodium: 136 mmol/L (ref 135–145)
Total Bilirubin: 0.6 mg/dL (ref 0.3–1.2)
Total Protein: 7.6 g/dL (ref 6.5–8.1)

## 2019-12-05 LAB — RAPID URINE DRUG SCREEN, HOSP PERFORMED
Amphetamines: NOT DETECTED
Barbiturates: NOT DETECTED
Benzodiazepines: NOT DETECTED
Cocaine: NOT DETECTED
Opiates: NOT DETECTED
Tetrahydrocannabinol: NOT DETECTED

## 2019-12-05 LAB — CBC
HCT: 35.4 % — ABNORMAL LOW (ref 36.0–46.0)
Hemoglobin: 10.8 g/dL — ABNORMAL LOW (ref 12.0–15.0)
MCH: 27.8 pg (ref 26.0–34.0)
MCHC: 30.5 g/dL (ref 30.0–36.0)
MCV: 91 fL (ref 80.0–100.0)
Platelets: 278 10*3/uL (ref 150–400)
RBC: 3.89 MIL/uL (ref 3.87–5.11)
RDW: 16.5 % — ABNORMAL HIGH (ref 11.5–15.5)
WBC: 12 10*3/uL — ABNORMAL HIGH (ref 4.0–10.5)
nRBC: 0.3 % — ABNORMAL HIGH (ref 0.0–0.2)

## 2019-12-05 LAB — SARS CORONAVIRUS 2 BY RT PCR (HOSPITAL ORDER, PERFORMED IN ~~LOC~~ HOSPITAL LAB): SARS Coronavirus 2: NEGATIVE

## 2019-12-05 MED ORDER — TRAZODONE HCL 50 MG PO TABS: 50.0000 mg | ORAL_TABLET | Freq: Every evening | ORAL | Status: DC | PRN

## 2019-12-05 MED ORDER — METOPROLOL SUCCINATE ER 25 MG PO TB24
50.0000 mg | ORAL_TABLET | Freq: Every day | ORAL | Status: DC
  Administered 2019-12-06: 50 mg via ORAL
  Filled 2019-12-05: qty 2

## 2019-12-05 MED ORDER — ARIPIPRAZOLE 5 MG PO TABS
10.0000 mg | ORAL_TABLET | Freq: Every day | ORAL | Status: DC
  Administered 2019-12-06: 10 mg via ORAL
  Filled 2019-12-05: qty 2

## 2019-12-05 MED ORDER — BUPROPION HCL ER (XL) 150 MG PO TB24
300.0000 mg | ORAL_TABLET | Freq: Every day | ORAL | Status: DC
  Administered 2019-12-06: 300 mg via ORAL
  Filled 2019-12-05: qty 2

## 2019-12-05 MED ORDER — INSULIN GLARGINE 100 UNIT/ML ~~LOC~~ SOLN
24.0000 [IU] | Freq: Every day | SUBCUTANEOUS | Status: DC
  Filled 2019-12-05 (×4): qty 0.24

## 2019-12-05 MED ORDER — HYDROCHLOROTHIAZIDE 25 MG PO TABS
12.5000 mg | ORAL_TABLET | Freq: Every day | ORAL | Status: DC
  Administered 2019-12-06: 12.5 mg via ORAL
  Filled 2019-12-05: qty 1

## 2019-12-05 MED ORDER — GABAPENTIN 300 MG PO CAPS
300.0000 mg | ORAL_CAPSULE | Freq: Every day | ORAL | Status: DC
  Administered 2019-12-06: 300 mg via ORAL
  Filled 2019-12-05: qty 1

## 2019-12-05 MED ORDER — METFORMIN HCL 500 MG PO TABS
1000.0000 mg | ORAL_TABLET | Freq: Every day | ORAL | Status: DC
  Administered 2019-12-06: 1000 mg via ORAL
  Filled 2019-12-05: qty 2

## 2019-12-05 MED ORDER — ATORVASTATIN CALCIUM 40 MG PO TABS
40.0000 mg | ORAL_TABLET | Freq: Every day | ORAL | Status: DC
  Administered 2019-12-06: 40 mg via ORAL
  Filled 2019-12-05: qty 1

## 2019-12-05 MED ORDER — COLESTIPOL HCL 1 G PO TABS
2.0000 g | ORAL_TABLET | Freq: Two times a day (BID) | ORAL | Status: DC
  Filled 2019-12-05 (×8): qty 2

## 2019-12-05 MED ORDER — FUROSEMIDE 40 MG PO TABS
40.0000 mg | ORAL_TABLET | Freq: Every day | ORAL | Status: DC
  Administered 2019-12-06: 40 mg via ORAL
  Filled 2019-12-05: qty 1

## 2019-12-05 NOTE — Telephone Encounter (Signed)
Pt. Returned call.  Advised of Dr. Marthann Schiller suggestion to contact insurance co. Re: preferred DME company.  Pt. Stated she will call the insurance co. And then let the office know where to send order to.

## 2019-12-05 NOTE — ED Provider Notes (Signed)
[facility] EMERGENCY DEPARTMENT Provider Note  CSN: [id number] Arrival date & time: 12/05/19 2118    History Chief Complaint  Patient presents with  . V70.1    HPI  [name] is a 54 y.o. female with schizoaffective disorder brought to the ED by PD with IVC papers taken out by family. Patient reports she got into an argument with her family after they took away her phone because they disapproved of her communicating with someone via text message. She got angry and made comments regarding self harm and overdosing on pills. She denies any current SI/HI, or hallucinations.    Past Medical History:  Diagnosis Date  . Charcot's joint of foot, left   . Depression   . Diabetes mellitus without complication ([place])   . Hyperlipidemia   . Hypertension   . Neuromuscular disorder ([place])   . Schizo affective schizophrenia ([place])    abilify and pazil    Past Surgical History:  Procedure Laterality Date  . CHOLECYSTECTOMY    . COLONOSCOPY WITH PROPOFOL N/A 05/26/2019   Procedure: COLONOSCOPY WITH PROPOFOL;  Surgeon: Toledo, Benay Pike, MD;  Location: [facility] ENDOSCOPY;  Service: Gastroenterology;  Laterality: N/A;  . ESOPHAGOGASTRODUODENOSCOPY (EGD) WITH PROPOFOL N/A 05/26/2019   Procedure: ESOPHAGOGASTRODUODENOSCOPY (EGD) WITH PROPOFOL;  Surgeon: Toledo, Benay Pike, MD;  Location: [facility] ENDOSCOPY;  Service: Gastroenterology;  Laterality: N/A;  . IRRIGATION AND DEBRIDEMENT FOOT Right 02/26/2016   Procedure: RIGHT 2ND TOE DEBRIDEMENT;  Surgeon: Samara Deist, DPM;  Location: [facility] ORS;  Service: Podiatry;  Laterality: Right;  . IRRIGATION AND DEBRIDEMENT FOOT Left 12/13/2018   Procedure: IRRIGATION AND DEBRIDEMENT FOOT;  Surgeon: Caroline More, DPM;  Location: [facility] ORS;  Service: Podiatry;  Laterality: Left;    Family History  Problem Relation Age of Onset  . Breast cancer Cousin        maternal cousin  . Hypertension Mother   . Hyperthyroidism Mother   . Dementia Mother   . Prostate  cancer Father   . Diabetes Maternal Grandmother   . Hypertension Maternal Grandmother   . Cancer Maternal Grandmother        unknown type  . Diabetes Maternal Grandfather   . Hypertension Maternal Grandfather   . Diabetes Paternal Grandmother   . Hypertension Paternal Grandmother   . Diabetes Paternal Grandfather   . Hypertension Paternal Grandfather     Social History   Tobacco Use  . Smoking status: Former Smoker    Packs/day: 3.00    Years: 30.00    Pack years: 90.00    Types: Cigarettes    Quit date: 05/02/2009    Years since quitting: 10.6  . Smokeless tobacco: Never Used  . Tobacco comment: Pt reported  quitting in 2011  Vaping Use  . Vaping Use: Never used  Substance Use Topics  . Alcohol use: No  . Drug use: No     Home Medications Prior to Admission medications   Medication Sig Start Date End Date Taking? Authorizing Provider  acetaminophen (TYLENOL) 325 MG tablet Take 2 tablets (650 mg total) by mouth every 6 (six) hours as needed for mild pain. 06/04/19   Nicole Kindred A, DO  ARIPiprazole (ABILIFY) 10 MG tablet Take 10 mg by mouth at bedtime.     [provider]  ascorbic acid (VITAMIN C) 500 MG tablet Take 500 mg by mouth daily.    [provider]  aspirin EC 81 MG tablet Take 81 mg by mouth daily.     [provider]  atorvastatin (LIPITOR) 40 MG tablet Take 1 tablet (40 mg total) by mouth daily. 02/20/19   Karamalegos, Devonne Doughty, DO  Blood Glucose Monitoring Suppl (ONETOUCH VERIO) w/Device KIT Use to check blood sugar up to 3 x daily 09/11/19   Parks Ranger, Devonne Doughty, DO  buPROPion (WELLBUTRIN XL) 300 MG 24 hr tablet Take 300 mg by mouth daily. 07/23/19   [provider]  cephALEXin (KEFLEX) 500 MG capsule Take 1 capsule (500 mg total) by mouth 3 (three) times daily for 10 days. 11/28/19 12/08/19  Nena Polio, MD  colestipol (COLESTID) 1 g tablet TAKE (2) TABLETS BY MOUTH TWICE DAILY. Patient taking differently: Take 2 g  by mouth 2 (two) times daily.  11/18/19   Karamalegos, Devonne Doughty, DO  Ferrous Sulfate 27 MG TABS Take 27 mg by mouth daily.     [provider]  furosemide (LASIX) 40 MG tablet Take 1 tablet (40 mg total) by mouth daily. 08/14/19   Karamalegos, Devonne Doughty, DO  gabapentin (NEURONTIN) 300 MG capsule Take 1 capsule (300 mg total) by mouth at bedtime. 08/14/19   Karamalegos, Devonne Doughty, DO  hydrochlorothiazide (HYDRODIURIL) 12.5 MG tablet Take 2 tablets (25 mg total) by mouth daily. Patient taking differently: Take 12.5 mg by mouth daily.  07/15/19   Karamalegos, Devonne Doughty, DO  insulin glargine (LANTUS) 100 UNIT/ML Solostar Pen Inject 24 Units into the skin daily.  10/02/19   [provider]  Lancets Glory Rosebush ULTRASOFT) lancets Use to check blood sugar up to 3 x daily 03/18/19   [name], DO  Loperamide HCl (IMODIUM PO) Take 1 capsule by mouth daily as needed (diarrhea). Patient reports that this is a liquid form.    [provider]  metFORMIN (GLUCOPHAGE) 1000 MG tablet Take 1,000 mg by mouth daily.  10/02/19   [provider]  metoprolol succinate (TOPROL-XL) 50 MG 24 hr tablet Take 1 tablet (50 mg total) by mouth daily. Take with or immediately following a meal. 08/15/19   Karamalegos, Devonne Doughty, DO  ondansetron (ZOFRAN) 4 MG tablet Take 1 tablet (4 mg total) by mouth every 6 (six) hours. Patient taking differently: Take 4 mg by mouth every 8 (eight) hours as needed for nausea or vomiting.  05/16/19   Wurst, [place], PA-C  ONETOUCH VERIO test strip Check blood sugar up to 5 x daily as needed 04/17/19   [name], DO  traZODone (DESYREL) 50 MG tablet TAKE 1 TABLET(50 MG) BY MOUTH AT BEDTIME AS NEEDED FOR SLEEP Patient taking differently: Take 50 mg by mouth at bedtime as needed for sleep.  10/10/19   Karamalegos, Devonne Doughty, DO  vitamin B-12 1000 MCG tablet Take 1 tablet (1,000 mcg total) by mouth daily. 06/05/19   Ezekiel Slocumb, DO      Allergies    Patient has no known allergies.   Review of Systems   Review of Systems A comprehensive review of systems was completed and negative except as noted in HPI.    Physical Exam BP (!) 148/70 (BP Location: Left Arm)   Pulse (!) 111   Temp 98.6 F (37 C) (Oral)   Resp 18   Ht 5' 10.5" (1.791 m)   Wt 104.3 kg   SpO2 100%   BMI 32.54 kg/m   Physical Exam Vitals and nursing note reviewed.  Constitutional:      Appearance: Normal appearance.  HENT:     Head: Normocephalic and atraumatic.  Nose: Nose normal.     Mouth/Throat:     Mouth: Mucous membranes are moist.  Eyes:     Extraocular Movements: Extraocular movements intact.     Conjunctiva/sclera: Conjunctivae normal.  Cardiovascular:     Rate and Rhythm: Normal rate.  Pulmonary:     Effort: Pulmonary effort is normal.     Breath sounds: Normal breath sounds.  Abdominal:     General: Abdomen is flat.     Palpations: Abdomen is soft.     Tenderness: There is no abdominal tenderness.  Musculoskeletal:        General: No swelling. Normal range of motion.     Cervical back: Neck supple.     Comments: Superficial diabetic ulcers of both feet without signs of infection.   Skin:    General: Skin is warm and dry.  Neurological:     General: No focal deficit present.     Mental Status: She is alert.  Psychiatric:        Mood and Affect: Mood normal.      ED Results / Procedures / Treatments   Labs (all labs ordered are listed, but only abnormal results are displayed) Labs Reviewed  CBC - Abnormal; Notable for the following components:      Result Value   WBC 12.0 (*)    Hemoglobin 10.8 (*)    HCT 35.4 (*)    RDW 16.5 (*)    nRBC 0.3 (*)    All other components within normal limits  COMPREHENSIVE METABOLIC PANEL - Abnormal; Notable for the following components:   CO2 21 (*)    Glucose, Bld 295 (*)    Creatinine, Ser 1.48 (*)    GFR calc non Af Amer 39 (*)    GFR calc Af Amer 46 (*)     All other components within normal limits  SARS CORONAVIRUS 2 BY RT PCR (HOSPITAL ORDER, [facility])  ETHANOL  RAPID URINE DRUG SCREEN, HOSP PERFORMED    EKG None  Radiology No results found.  Procedures Procedures  Medications Ordered in the ED Medications  ARIPiprazole (ABILIFY) tablet 10 mg (has no administration in time range)  atorvastatin (LIPITOR) tablet 40 mg (has no administration in time range)  buPROPion (WELLBUTRIN XL) 24 hr tablet 300 mg (has no administration in time range)  colestipol (COLESTID) tablet 2 g (has no administration in time range)  furosemide (LASIX) tablet 40 mg (has no administration in time range)  gabapentin (NEURONTIN) capsule 300 mg (has no administration in time range)  hydrochlorothiazide (HYDRODIURIL) tablet 12.5 mg (has no administration in time range)  insulin glargine (LANTUS) injection 24 Units (has no administration in time range)  metFORMIN (GLUCOPHAGE) tablet 1,000 mg (has no administration in time range)  metoprolol succinate (TOPROL-XL) 24 hr tablet 50 mg (has no administration in time range)  traZODone (DESYREL) tablet 50 mg (has no administration in time range)     MDM Rules/Calculators/A&P MDM Patient calm and cooperative now. Denies any current SI/HI. Will check labs for medical clearance and then plan TTS consult for disposition.  ED Course  I have reviewed the triage vital signs and the nursing notes.  Pertinent labs & imaging results that were available during my care of the patient were reviewed by me and considered in my medical decision making (see chart for details).  Clinical Course as of Dec 05 33  Thu Dec 05, 2019  2240 Glucose is mildly elevated, no signs of DKA. WBC  is also mildly elevated, but no fever or other signs of infection. EtOH and UDS are neg. Medically Clear for TTS. Placed in Psych Obs, Home meds ordered. Care of the patient will be signed out to the oncoming provider  pending TTS evaluation.    [CS]    Clinical Course User Index [CS] Truddie Hidden, MD    Final Clinical Impression(s) / ED Diagnoses Final diagnoses:  Suicidal ideation    Rx / DC Orders ED Discharge Orders    None       Truddie Hidden, MD 12/06/19 [phone]

## 2019-12-05 NOTE — Telephone Encounter (Signed)
Attempted to call pt.  Left vm. On mobile phone to return call to office re: her request; advised that more information is needed.

## 2019-12-05 NOTE — ED Triage Notes (Signed)
Pt arrives from home via ems.   Pt's family took out IVC papers due to pt's statements about wanting to harm herself.  Pt admits that she got upset with family members because they "took her phone from her".   Pt states "I really didn't mean it. I was just mad".   Pt states she told her family members she would "just take some pills or something".  Pt is calm, cooperative, pleasant, denies thoughts of self harm or wanting to harm anyone else.

## 2019-12-05 NOTE — ED Notes (Signed)
AC contacted for medication.  

## 2019-12-05 NOTE — ED Notes (Signed)
Pt denies SI/HI thoughts.

## 2019-12-05 NOTE — Telephone Encounter (Signed)
I looked into it further and spoke with Noreene Larsson RN with CCM and we came up with some locations that would work.  [address] 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

## 2019-12-05 NOTE — Telephone Encounter (Signed)
FYI, unable to reach the patient after several attempts and she has no VM set so will follow up if she calls back.

## 2019-12-06 ENCOUNTER — Other Ambulatory Visit (HOSPITAL_COMMUNITY)
Admission: RE | Admit: 2019-12-06 | Discharge: 2019-12-06 | Disposition: A | Discharge status: Home / Self Care | Payer: Medicare Other | Source: Ambulatory Visit | Attending: Gastroenterology | Admitting: Gastroenterology

## 2019-12-06 DIAGNOSIS — Z743 Need for continuous supervision: Secondary | ICD-10-CM | POA: Diagnosis not present

## 2019-12-06 DIAGNOSIS — R0902 Hypoxemia: Secondary | ICD-10-CM | POA: Diagnosis not present

## 2019-12-06 DIAGNOSIS — R45851 Suicidal ideations: Secondary | ICD-10-CM | POA: Diagnosis not present

## 2019-12-06 DIAGNOSIS — Z7401 Bed confinement status: Secondary | ICD-10-CM | POA: Diagnosis not present

## 2019-12-06 LAB — CBG MONITORING, ED
Glucose-Capillary: 204 mg/dL — ABNORMAL HIGH (ref 70–99)
Glucose-Capillary: 214 mg/dL — ABNORMAL HIGH (ref 70–99)

## 2019-12-06 LAB — HEMOGLOBIN A1C
Hgb A1c MFr Bld: 8.8 % — ABNORMAL HIGH (ref 4.8–5.6)
Mean Plasma Glucose: 205.86 mg/dL

## 2019-12-06 MED ORDER — CEPHALEXIN 500 MG PO CAPS
500.0000 mg | ORAL_CAPSULE | Freq: Three times a day (TID) | ORAL | Status: DC
  Administered 2019-12-06 (×2): 500 mg via ORAL
  Filled 2019-12-06 (×2): qty 1

## 2019-12-06 MED ORDER — INSULIN ASPART 100 UNIT/ML ~~LOC~~ SOLN
0.0000 [IU] | Freq: Three times a day (TID) | SUBCUTANEOUS | Status: DC
  Administered 2019-12-06 (×2): 3 [IU] via SUBCUTANEOUS
  Filled 2019-12-06 (×2): qty 1

## 2019-12-06 NOTE — Progress Notes (Deleted)
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  INTERVAL HISTORY: Patient returns to clinic today for repeat laboratory work and further evaluation.  She continues to have chronic weakness and fatigue, but this is improving.  Over the past 24 to 48 hours she has noticed an increase in diarrhea.  She has no neurologic complaints.  She denies any recent fevers or illnesses. She has no chest pain, shortness of breath, cough, or hemoptysis.  She denies any nausea, vomiting, or constipation.  She has no melena or hematochezia.  She has no urinary complaints.  Patient offers no further specific complaints today.  REVIEW OF SYSTEMS:   Review of Systems  Constitutional: Positive for malaise/fatigue. Negative for fever and weight loss.  Respiratory: Negative.  Negative for cough, hemoptysis and shortness of breath.   Cardiovascular: Negative.  Negative for chest pain and leg swelling.  Gastrointestinal: Positive for diarrhea. Negative for abdominal pain, blood in stool, melena and nausea.  Genitourinary: Negative.  Negative for dysuria.  Musculoskeletal: Negative.  Negative for back pain.  Skin: Negative.  Negative for rash.  Neurological: Positive for weakness. Negative for dizziness, sensory change, focal weakness and headaches.  Psychiatric/Behavioral: Negative.  The patient is  not nervous/anxious.     As per HPI. Otherwise, a complete review of systems is negative.  PAST MEDICAL HISTORY: Past Medical History:  Diagnosis Date  . Charcot's joint of foot, left   . Depression   . Diabetes mellitus without complication ([place])   . Hyperlipidemia   . Hypertension   . Neuromuscular disorder ([place])   . Schizo affective schizophrenia ([place])    abilify and pazil    PAST SURGICAL HISTORY: Past Surgical History:  Procedure Laterality Date  . CHOLECYSTECTOMY    . COLONOSCOPY WITH PROPOFOL N/A 05/26/2019   Procedure: COLONOSCOPY WITH PROPOFOL;  Surgeon: Toledo, Benay Pike, MD;  Location: [facility] ENDOSCOPY;  Service: Gastroenterology;  Laterality: N/A;  . ESOPHAGOGASTRODUODENOSCOPY (EGD) WITH PROPOFOL N/A 05/26/2019   Procedure: ESOPHAGOGASTRODUODENOSCOPY (EGD) WITH PROPOFOL;  Surgeon: Toledo, Benay Pike, MD;  Location: [facility] ENDOSCOPY;  Service: Gastroenterology;  Laterality: N/A;  . IRRIGATION AND DEBRIDEMENT FOOT Right 02/26/2016   Procedure: RIGHT 2ND TOE DEBRIDEMENT;  Surgeon: Samara Deist, DPM;  Location: [facility] ORS;  Service: Podiatry;  Laterality: Right;  . IRRIGATION AND DEBRIDEMENT FOOT Left 12/13/2018   Procedure: IRRIGATION AND DEBRIDEMENT FOOT;  Surgeon: Caroline More, DPM;  Location: [facility] ORS;  Service: Podiatry;  Laterality: Left;    FAMILY HISTORY: Family History  Problem Relation Age of Onset  . Breast cancer Cousin        maternal cousin  . Hypertension Mother   . Hyperthyroidism Mother   . Dementia Mother   . Prostate cancer Father   . Diabetes Maternal Grandmother   . Hypertension Maternal Grandmother   . Cancer Maternal Grandmother        unknown type  . Diabetes Maternal Grandfather   . Hypertension Maternal Grandfather   . Diabetes Paternal Grandmother   .  Hypertension Paternal Grandmother   . Diabetes Paternal Grandfather   . Hypertension Paternal Grandfather     ADVANCED DIRECTIVES (Y/N):  N  HEALTH MAINTENANCE: Social History    Tobacco Use  . Smoking status: Former Smoker    Packs/day: 3.00    Years: 30.00    Pack years: 90.00    Types: Cigarettes    Quit date: 05/02/2009    Years since quitting: 10.6  . Smokeless tobacco: Never Used  . Tobacco comment: Pt reported  quitting in 2011  Vaping Use  . Vaping Use: Never used  Substance Use Topics  . Alcohol use: No  . Drug use: No     Colonoscopy:  PAP:  Bone density:  Lipid panel:  No Known Allergies  No current facility-administered medications for this visit.   Current Outpatient Medications  Medication Sig Dispense Refill  . acetaminophen (TYLENOL) 325 MG tablet Take 2 tablets (650 mg total) by mouth every 6 (six) hours as needed for mild pain.    . ARIPiprazole (ABILIFY) 10 MG tablet Take 10 mg by mouth at bedtime.     [name] ascorbic acid (VITAMIN C) 500 MG tablet Take 500 mg by mouth daily.    [name] aspirin EC 81 MG tablet Take 81 mg by mouth daily.     [name] atorvastatin (LIPITOR) 40 MG tablet Take 1 tablet (40 mg total) by mouth daily. 90 tablet 3  . Blood Glucose Monitoring Suppl (ONETOUCH VERIO) w/Device KIT Use to check blood sugar up to 3 x daily 1 kit 0  . buPROPion (WELLBUTRIN XL) 300 MG 24 hr tablet Take 300 mg by mouth daily.    . cephALEXin (KEFLEX) 500 MG capsule Take 1 capsule (500 mg total) by mouth 3 (three) times daily for 10 days. 30 capsule 0  . colestipol (COLESTID) 1 g tablet TAKE (2) TABLETS BY MOUTH TWICE DAILY. (Patient taking differently: Take 2 g by mouth 2 (two) times daily. ) 120 tablet 0  . Ferrous Sulfate 27 MG TABS Take 27 mg by mouth daily.     . furosemide (LASIX) 40 MG tablet Take 1 tablet (40 mg total) by mouth daily. 90 tablet 1  . gabapentin (NEURONTIN) 300 MG capsule Take 1 capsule (300 mg total) by mouth at bedtime. 90 capsule 1  . hydrochlorothiazide (HYDRODIURIL) 12.5 MG tablet Take 2 tablets (25 mg total) by mouth daily. (Patient taking differently: Take 12.5 mg by mouth daily. ) 90 tablet 1  . insulin glargine  (LANTUS) 100 UNIT/ML Solostar Pen Inject 24 Units into the skin daily.     . Lancets (ONETOUCH ULTRASOFT) lancets Use to check blood sugar up to 3 x daily 100 each 12  . Loperamide HCl (IMODIUM PO) Take 1 capsule by mouth daily as needed (diarrhea). Patient reports that this is a liquid form.    . metFORMIN (GLUCOPHAGE) 1000 MG tablet Take 1,000 mg by mouth daily.     . metoprolol succinate (TOPROL-XL) 50 MG 24 hr tablet Take 1 tablet (50 mg total) by mouth daily. Take with or immediately following a meal. 90 tablet 0  . ondansetron (ZOFRAN) 4 MG tablet Take 1 tablet (4 mg total) by mouth every 6 (six) hours. (Patient taking differently: Take 4 mg by mouth every 8 (eight) hours as needed for nausea or vomiting. ) 12 tablet 0  . ONETOUCH VERIO test strip Check blood sugar up to 5 x daily as needed 450 each 3  . traZODone (DESYREL) 50 MG tablet TAKE  1 TABLET(50 MG) BY MOUTH AT BEDTIME AS NEEDED FOR SLEEP (Patient taking differently: Take 50 mg by mouth at bedtime as needed for sleep. ) 30 tablet 2  . vitamin B-12 1000 MCG tablet Take 1 tablet (1,000 mcg total) by mouth daily.     Facility-Administered Medications Ordered in Other Visits  Medication Dose Route Frequency Provider Last Rate Last Admin  . ARIPiprazole (ABILIFY) tablet 10 mg  10 mg Oral QHS Truddie Hidden, MD   10 mg at 12/06/19 0111  . atorvastatin (LIPITOR) tablet 40 mg  40 mg Oral Daily Truddie Hidden, MD      . buPROPion (WELLBUTRIN XL) 24 hr tablet 300 mg  300 mg Oral Daily Truddie Hidden, MD      . cephALEXin (KEFLEX) capsule 500 mg  500 mg Oral TID Rancour, Stephen, MD      . colestipol (COLESTID) tablet 2 g  2 g Oral BID Truddie Hidden, MD      . furosemide (LASIX) tablet 40 mg  40 mg Oral Daily Calvert Cantor B, MD      . gabapentin (NEURONTIN) capsule 300 mg  300 mg Oral QHS Truddie Hidden, MD   300 mg at 12/06/19 0111  . hydrochlorothiazide (HYDRODIURIL) tablet 12.5 mg  12.5 mg Oral Daily Truddie Hidden, MD      . insulin aspart (novoLOG) injection 0-9 Units  0-9 Units Subcutaneous TID WC Rancour, Stephen, MD      . insulin glargine (LANTUS) injection 24 Units  24 Units Subcutaneous QHS Calvert Cantor B, MD      . metFORMIN (GLUCOPHAGE) tablet 1,000 mg  1,000 mg Oral Daily Calvert Cantor B, MD      . metoprolol succinate (TOPROL-XL) 24 hr tablet 50 mg  50 mg Oral Daily Truddie Hidden, MD      . traZODone (DESYREL) tablet 50 mg  50 mg Oral QHS PRN Truddie Hidden, MD        OBJECTIVE: There were no vitals filed for this visit.   There is no height or weight on file to calculate BMI.    ECOG FS:1 - Symptomatic but completely ambulatory  General: Well-developed, well-nourished, no acute distress. Eyes: Pink conjunctiva, anicteric sclera. HEENT: Normocephalic, moist mucous membranes. Lungs: No audible wheezing or coughing. Heart: Regular rate and rhythm. Abdomen: Soft, nontender, no obvious distention. Musculoskeletal: No edema, cyanosis, or clubbing. Neuro: Alert, answering all questions appropriately. Cranial nerves grossly intact. Skin: No rashes or petechiae noted. Psych: Normal affect.   LAB RESULTS:  Lab Results  Component Value Date   NA 136 12/05/2019   K 4.0 12/05/2019   CL 102 12/05/2019   CO2 21 (L) 12/05/2019   GLUCOSE 295 (H) 12/05/2019   BUN 18 12/05/2019   CREATININE 1.48 (H) 12/05/2019   CALCIUM 9.3 12/05/2019   PROT 7.6 12/05/2019   ALBUMIN 4.0 12/05/2019   AST 21 12/05/2019   ALT 21 12/05/2019   ALKPHOS 121 12/05/2019   BILITOT 0.6 12/05/2019   GFRNONAA 39 (L) 12/05/2019   GFRAA 46 (L) 12/05/2019    Lab Results  Component Value Date   WBC 12.0 (H) 12/05/2019   NEUTROABS 6.4 11/27/2019   HGB 10.8 (L) 12/05/2019   HCT 35.4 (L) 12/05/2019   MCV 91.0 12/05/2019   PLT 278 12/05/2019   Lab Results  Component Value Date   IRON 57 11/25/2019   TIBC CANCELED 11/25/2019   IRONPCTSAT 13 09/10/2019   Lab Results  Component Value Date    FERRITIN 198 11/25/2019     STUDIES: DG Foot Complete Left  Result Date: 11/27/2019 CLINICAL DATA:  54 year old female with discoloration of the left foot. Chronic neuropathy. EXAM: LEFT FOOT - COMPLETE 3+ VIEW COMPARISON:  Left foot radiographs 09/17/2019. FINDINGS: Chronic neuropathic changes to the left midfoot with tarsal bone collapse and disorganization. Superimposed heterogeneous osteopenia, but no osteolysis, acute fracture or dislocation is identified. Calcified peripheral vascular disease. Increased soft tissue swelling in the distal foot since May. No soft tissue gas. IMPRESSION: Chronic neuropathic changes with some increased soft tissue swelling since May but no acute osseous abnormality identified. Electronically Signed   By: Genevie Ann M.D.   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  PLAN:    1. 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 Final pathology and imaging reviewed independently.  Patient had a T2, N0, M0 lesion which does not require adjuvant chemotherapy.  No further interventions are needed.  Her most recent CEA on July 25, 2019 was reported at 1.4.  Continue follow-up with surgery as indicated.  Patient will require repeat colonoscopy in 6 to 12 months from partial colectomy.  2.  Iron deficiency anemia: Patient's hemoglobin continues to slowly trend up and is 9.4.  Although her ferritin is 74, her iron saturation continues to be at the low level of normal at 13%.  Because of this, patient will return to clinic later this week to receive 1 infusion of 510 mg IV Feraheme.  No further intervention is needed.  Return to clinic in 2 months with repeat laboratory work and further evaluation.   3.  Acute on chronic renal insufficiency: Patient's creatinine has trended up slightly, possibly secondary to recent bout of diarrhea.  Encourage increased fluid intake. 4.  Hyperkalemia:  Resolved. 5.  Diarrhea: Recommended increasing fluids and OTC Imodium.  Patient expressed understanding and was in agreement with this plan. She also understands that She can call clinic at any time with any questions, concerns, or complaints.   Cancer Staging Adenocarcinoma of sigmoid colon [facility]) Staging form: Colon and Rectum, AJCC 8th Edition - Clinical stage from 06/07/2019: Stage I (cT2, cN0, cM0) - Signed by Lloyd Huger, MD on 06/07/2019   Lloyd Huger, MD   12/06/2019 6:48 AM

## 2019-12-06 NOTE — ED Notes (Signed)
Family arrived to ED waiting room and patient access gave then the number to [facility] because they are voicing concern over her being discharged today per [facility] in patient access.

## 2019-12-06 NOTE — Telephone Encounter (Signed)
Faxed

## 2019-12-06 NOTE — ED Notes (Signed)
EMS refused to take patient due to being wheelchair bound. Notified AC of this. Will contact [employer]

## 2019-12-06 NOTE — Progress Notes (Signed)
Called patient, her home voicemail had not been set up. I asked her to call Hoyle Sauer Monday to set up an so she be able to keep her procedure appointment. And of course if she had questions for me to call my #. Nothing further needed at this time.

## 2019-12-06 NOTE — ED Notes (Signed)
IVC paperwork rescinded and faxed to magistrate.

## 2019-12-06 NOTE — ED Notes (Signed)
This nurse was contacted by the patient's sister, Shelle Iron, who informed me if the patient is released and overdoses on all the pills in her home; the family will hold me personally responsible for her death. Maudie Mercury said she was making a record of our conversation, even though I told her I did not have info about whether she would be admitted at this point. Maudie Mercury states the patient is very manipulative and has stolen money from her mother with dementia.

## 2019-12-06 NOTE — Clinical Social Work Note (Signed)
Transition of Care [facility]) - Emergency Department Mini Assessment  Patient Details  Name: [name] MRN: [id number] Date of Birth: 1965/08/31  Transition of Care [facility]) CM/SW Contact:    Sherie Don, LCSW Phone Number: 12/06/2019, 2:08 PM  Clinical Narrative: Patient is a 53 year old female who presented to the ED under IVC. TOC consulted for transportation assistance now that patient is psych cleared. Due to concerns from family regarding patient sending money through [place] to people she does not know and wanting POA, CSW called DSS to make referral.  CSW spoke with Jolene Schimke to make report. CSW expressed concerns about patient's handling of her finances and Ms. Bobette Mo reported this was not explicitly financial exploitation and the patient has capacity. Ms. Bobette Mo stated she would refer the patient to the [facility] for follow up with the behavioral health specialist.  ED Mini Assessment: What brought you to the Emergency Department? : IVC Barriers to Discharge: ED Barriers Resolved, ED Psych evaluation Barrier interventions: TTS consult; referral to [place] through [place] of departure: Ambulance Interventions which prevented an admission or readmission: Other (must enter comment) (Referred to [place] through [place])  Patient Contact and Communications Key Contact 1: DSS [place]) Key Contact 2: [facility] Contact Date: 12/06/19     Call outcome: Referred to [facility]  Admission diagnosis:  IVC Patient Active Problem List   Diagnosis Date Noted  . Chronic diarrhea 11/25/2019  . History of colon cancer 11/25/2019  . Heart palpitations 08/19/2019  . Iron deficiency anemia 07/25/2019  . Diabetes mellitus with polyneuropathy ([place]) 07/17/2019  . S/P partial colectomy 07/17/2019  . Acute kidney injury ([place]) 07/17/2019  . 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[place]) 05/28/2019  . Anemia   .  Hyponatremia 05/23/2019  . Neuropathic foot ulcer, left, with fat layer exposed ([place]) 02/05/2019  . Hyperkalemia 02/05/2019  . Persistent microalbuminuria associated with type 2 diabetes mellitus ([place]) 05/16/2018  . Type 2 diabetes mellitus with hyperglycemia, with long-term current use of insulin ([place]) 05/16/2018  . Morbid obesity ([place]) 05/10/2017  . Long-term use of high-risk medication 05/10/2017  . Hyperlipidemia associated with type 2 diabetes mellitus ([place]) 06/27/2016  . Hammer toe of second toe of right foot 04/20/2016  . Essential hypertension 06/29/2015  . Schizoaffective disorder ([place]) 06/29/2015  . DM (diabetes mellitus), type 2, 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[place]) 16/96/7893  . Polyneuropathy 04/02/2014   PCP:  [name], DO Pharmacy:   [place], [place] [address] [place] [address] Phone: [phone] Fax: [place], [place] [address] [place] [address] Phone: [phone] Fax: [phone]

## 2019-12-06 NOTE — Discharge Instructions (Signed)
Substance Abuse Treatment Programs ° °Intensive Outpatient Programs °[facility]     °[address]      °[place], [place]                   °[phone]      ° °[facility] °[address] °[place], [place] °[phone] ° °[facility]     °(Inpatient and outpatient)     °[address]           °[phone]   ° °[facility] °[phone] (Suboxone and Methadone) ° °[address]      °[place], [place] [address]      °[phone]      ° °[address] °[place], [place] °[phone] ° °[facility] (Outpatient/Inpatient, [facility])    °(insurance only) [phone]      °       °[facility] (Groups & Residential) °[place], [place] °[phone] ° °   °[facility]     °[address]     °[place], [place]      °[phone]      ° °[facility] (for caregivers and family) °[address] °[place], [place] °336-299-4655 ° ° ° ° ° °Residential Treatment Programs °[facility]      °[address], [place], [place] [address]  °[phone]      ° °[facility] °[address] [place], [place] [address] °[phone] ° °[facility]        °[phone]      ° °[facility] °[phone] ° °[facility]             °[address]                                         °[place], [place]                                                °[phone] or [phone]                              ° °[facility] [place] °[address] °[place], [address] °[phone] ° °[facility]    °[address]      °[place], [place]     °[phone]      ° °[facility] °[address] °[place], [place] °[phone] ° °[facility]   °[address]     °[place], [place] [address]     °[phone]      °Admissions: 8am-3pm M-F ° °[facility] °[address] °[place],  [place] °336-227-7417 ° °[facility]: Residential Program (90 Days)   °[place], [place]      °336-725-8389 or 800-758-6077    ° °[facility] °[place], [place] °(Walk in Hours over the weekend or by referral) ° °[facility] °[address], [place],  [address] °([phone] ° °Crisis Mobile: [facility]:  1-877-626-1772 (for crisis response 24 hours a day) °[facility] Hotline:      1-800-256-2452 °Outpatient Psychiatry and Counseling ° °[facility]: Mobile Crisis   Management 24 hours:  1-877-626-1772 ° °[facility] sliding scale fee and walk in schedule: M-F 8am-12pm/1pm-3pm °[address]  °[place], [place] [address] °[phone] ° °[facility] °[address] °[place], [place] [address] °[phone] ° °[facility] ([facility])- new patient walk-in appointments available Monday - Friday 8am -3pm.          °[address] °[place], [place] [address] °336-676-6840 or crisis line- 336-676-6905 ° °[facility] Intensive Outpatient Therapy Program °[address] °[place], [place] [address] °336-832-9804 ° °[facility]                  °Crisis Services      °[phone]      °[address]     °[place], [place] [address]                ° °[facility]   °[facility] °[phone] °[address] °[place], [place] [address] ° ° °[facility]          °[address]  °[place], [place] [address]       °[phone] ° °[facility] °[address] °[place], [place] [address] °336-292-1510 ° °[facility]    °[address]    °[place], [place] [address]     °[phone]      ° °Parish McKinney, MD     °[address]     °[place] [address]     °[phone]     °  °[facility] °[address] °[place] [address] ° °[facility]     °[address]     °[place], [place]      °336-542-2076      ° °[facility] °Shamsher Ahluwalia, MD °[address] °[place], [place] [address] °[phone] ° °[facility]     °[address]     °[place], [place] [address]     °336-274-1237      ° °[facility] °[address] °[place], [place] [address] °336-854-4450 °Resources for Temporary Residential Assistance/Crisis Centers ° °DAY CENTERS °[facility] °M-F 8am-3pm   °[address] [place], [place] [address]   336-332-0824 °Services include: laundry, barbering, support groups, case management, phone  & computer access, showers, AA/NA mtgs, mental health/substance abuse nurse, job skills class, disability information, VA assistance, spiritual classes, etc.  ° °HOMELESS SHELTERS ° °[facility]     °[facility]   °[address], [place]     °[phone]       °       °[facility] (women and children)       °[address] °[place], [place] [address] °336-275-0820 °[email or web address] for application and process °Application Required ° °[facility]   °[address]    °[place] [address]     °[phone]       °             °[facility] °[address] °[place], [place] [address] °336.273.5572 °336-235-0363(schedule application appt.) °Application Required ° °[facility] (women only)    °[address]     °[place],  [address]     °336-884-1039      °  Intake starts 6pm daily °Need valid ID, SSC, & Police report °[facility] [place] °[address] °[place], [place] °336-881-5420 °Application Required ° °[facility] (men only)     °[address]      °[place], [place]     °[phone]      ° °[facility] °(Pregnant women only) °[address] °[place], [place] °336-275-0206 ° °[facility]      °[address]      °[place], [place] [address]     °[phone]      °       °[facility] °[address] °[place], [place] °336-723-1848 °90 day commitment/SA/Application process ° °[facility](men only)     °[address]     °[place], [place]     °[phone]       °Check-in at 7pm     °       °[facility] °[address] °[place], [place] [address] °336-248-6684 °Men/Women/Women and Children must be there by 7 pm ° °[facility] °[place], [place] °336-722-8721                ° °
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
## 2019-12-06 NOTE — ED Provider Notes (Signed)
Emergency Medicine Observation Re-evaluation Note  [name] is a 54 y.o. female, seen on rounds today.  Pt initially presented to the ED for complaints of V70.1 Currently, the patient is awaiting TTS re-evaluation this AM.  Physical Exam  BP 125/73 (BP Location: Right Arm)   Pulse 86   Temp 97.7 F (36.5 C) (Oral)   Resp 16   Ht 5' 10.5" (1.791 m)   Wt 104.3 kg   SpO2 96%   BMI 32.54 kg/m  Physical Exam  Calm and cooperative.  Even unlabored respirations.   ED Course / MDM  EKG:  Clinical Course as of Dec 05 728  Thu Dec 05, 2019  2240 Glucose is mildly elevated, no signs of DKA. WBC is also mildly elevated, but no fever or other signs of infection. EtOH and UDS are neg. Medically Clear for TTS. Placed in Psych Obs, Home meds ordered. Care of the patient will be signed out to the oncoming provider pending TTS evaluation.    [CS]    Clinical Course User Index [CS] Truddie Hidden, MD   I have reviewed the labs performed to date as well as medications administered while in observation.  Recent changes in the last 24 hours include N/A. Plan  Current plan is for TTS re-evaluation this AM. Patient is under full IVC at this time.  03:03 PM   patient has been evaluated by behavioral health as well as social work.  They have obtain collateral from the patient's family and consider her to be psychiatrically cleared at this time.  Plan to rescind the IVC paperwork.  They have also made referrals to DSS which has been completed.  Patient stable for discharge from the emergency department at this time.   Margette Fast, MD 12/06/19 [phone]
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
## 2019-12-10 ENCOUNTER — Encounter (HOSPITAL_COMMUNITY): Admission: RE | Payer: Self-pay | Source: Home / Self Care

## 2019-12-10 ENCOUNTER — Ambulatory Visit (HOSPITAL_COMMUNITY): Admission: RE | Admit: 2019-12-10 | Payer: Medicare Other | Source: Home / Self Care | Admitting: Gastroenterology

## 2019-12-10 SURGERY — COLONOSCOPY WITH PROPOFOL
Anesthesia: Monitor Anesthesia Care

## 2019-12-10 NOTE — OR Nursing (Signed)
Attempted to call patient multiple times due to her not showing up for procedure. Attempted to call patient's daughter without success. Lacretia Nicks at the office notified.

## 2019-12-12 ENCOUNTER — Inpatient Hospital Stay: Payer: Medicaid Other | Admitting: Oncology

## 2019-12-12 ENCOUNTER — Inpatient Hospital Stay: Payer: Medicaid Other

## 2019-12-18 ENCOUNTER — Other Ambulatory Visit: Payer: Self-pay | Admitting: Family Medicine

## 2019-12-18 ENCOUNTER — Ambulatory Visit: Payer: Self-pay | Admitting: Pharmacist

## 2019-12-18 DIAGNOSIS — E1169 Type 2 diabetes mellitus with other specified complication: Secondary | ICD-10-CM

## 2019-12-18 DIAGNOSIS — F251 Schizoaffective disorder, depressive type: Secondary | ICD-10-CM

## 2019-12-18 DIAGNOSIS — I10 Essential (primary) hypertension: Secondary | ICD-10-CM

## 2019-12-18 DIAGNOSIS — E785 Hyperlipidemia, unspecified: Secondary | ICD-10-CM

## 2019-12-18 DIAGNOSIS — E1149 Type 2 diabetes mellitus with other diabetic neurological complication: Secondary | ICD-10-CM | POA: Diagnosis not present

## 2019-12-18 DIAGNOSIS — E1165 Type 2 diabetes mellitus with hyperglycemia: Secondary | ICD-10-CM

## 2019-12-18 DIAGNOSIS — IMO0002 Reserved for concepts with insufficient information to code with codable children: Secondary | ICD-10-CM

## 2019-12-18 DIAGNOSIS — E1142 Type 2 diabetes mellitus with diabetic polyneuropathy: Secondary | ICD-10-CM

## 2019-12-18 DIAGNOSIS — K219 Gastro-esophageal reflux disease without esophagitis: Secondary | ICD-10-CM

## 2019-12-18 MED ORDER — ATORVASTATIN CALCIUM 40 MG PO TABS: 40.0000 mg | ORAL_TABLET | Freq: Every day | ORAL | 3 refills | Status: DC

## 2019-12-18 MED ORDER — METOPROLOL SUCCINATE ER 50 MG PO TB24: 50.0000 mg | ORAL_TABLET | Freq: Every day | ORAL | 3 refills | Status: DC

## 2019-12-18 MED ORDER — PANTOPRAZOLE SODIUM 40 MG PO TBEC: 40.0000 mg | DELAYED_RELEASE_TABLET | Freq: Two times a day (BID) | ORAL | 3 refills | Status: DC

## 2019-12-18 MED ORDER — ASPIRIN EC 81 MG PO TBEC: 81.0000 mg | DELAYED_RELEASE_TABLET | Freq: Every day | ORAL | 3 refills | Status: DC

## 2019-12-18 MED ORDER — GABAPENTIN 300 MG PO CAPS: 300.0000 mg | ORAL_CAPSULE | Freq: Every day | ORAL | 3 refills | Status: DC

## 2019-12-18 MED ORDER — HYDROCHLOROTHIAZIDE 12.5 MG PO TABS: 12.5000 mg | ORAL_TABLET | Freq: Every day | ORAL | 3 refills | Status: DC

## 2019-12-18 MED ORDER — FUROSEMIDE 40 MG PO TABS: 40.0000 mg | ORAL_TABLET | Freq: Every day | ORAL | 3 refills | Status: DC
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
## 2019-12-19 ENCOUNTER — Ambulatory Visit: Payer: Self-pay | Admitting: General Practice

## 2019-12-19 ENCOUNTER — Telehealth: Payer: Self-pay | Admitting: General Practice

## 2019-12-19 DIAGNOSIS — E1169 Type 2 diabetes mellitus with other specified complication: Secondary | ICD-10-CM

## 2019-12-19 DIAGNOSIS — F251 Schizoaffective disorder, depressive type: Secondary | ICD-10-CM

## 2019-12-19 DIAGNOSIS — I10 Essential (primary) hypertension: Secondary | ICD-10-CM

## 2019-12-19 DIAGNOSIS — IMO0002 Reserved for concepts with insufficient information to code with codable children: Secondary | ICD-10-CM

## 2019-12-19 DIAGNOSIS — E1149 Type 2 diabetes mellitus with other diabetic neurological complication: Secondary | ICD-10-CM
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
## 2019-12-19 NOTE — Chronic Care Management (AMB) (Signed)
Chronic Care Management   Follow Up Note   12/19/2019 Name: [name] MRN: [id number] DOB: 1966/04/15  Referred by: [name], DO Reason for referral : Chronic Care Management (RNCM follow up call: Chronic Disease Managment and Care Coordiantion Needs)   [name] is a 54 y.o. year old female who is a primary care patient of [name], DO. The CCM team was consulted for assistance with chronic disease management and care coordination needs.    Review of patient status, including review of consultants reports, relevant laboratory and other test results, and collaboration with appropriate care team members and the patient's provider was performed as part of comprehensive patient evaluation and provision of chronic care management services.    SDOH (Social Determinants of Health) assessments performed: Yes See Care Plan activities for detailed interventions related to [facility])     Outpatient Encounter Medications as of 12/19/2019  Medication Sig Note  . acetaminophen (TYLENOL) 325 MG tablet Take 2 tablets (650 mg total) by mouth every 6 (six) hours as needed for mild pain.   . ARIPiprazole (ABILIFY) 10 MG tablet Take 10 mg by mouth at bedtime.    [name] aspirin EC 81 MG tablet Take 1 tablet (81 mg total) by mouth daily.   [name] atorvastatin (LIPITOR) 40 MG tablet Take 1 tablet (40 mg total) by mouth daily.   . Blood Glucose Monitoring Suppl (ONETOUCH VERIO) w/Device KIT Use to check blood sugar up to 3 x daily   . buPROPion (WELLBUTRIN XL) 300 MG 24 hr tablet Take 300 mg by mouth daily.   . colestipol (COLESTID) 1 g tablet TAKE (2) TABLETS BY MOUTH TWICE DAILY. (Patient taking differently: Take 2 g by mouth 2 (two) times daily. ) 12/06/2019: Patient is not 100% sure if she takes this med or not. According to dispense hx, patient has been getting it filled.  . Ferrous Sulfate 27 MG TABS Take 27 mg by mouth daily.    . furosemide (LASIX) 40 MG tablet Take 1  tablet (40 mg total) by mouth daily.   [name] gabapentin (NEURONTIN) 300 MG capsule Take 1 capsule (300 mg total) by mouth at bedtime.   . hydrochlorothiazide (HYDRODIURIL) 12.5 MG tablet Take 1 tablet (12.5 mg total) by mouth daily.   . insulin glargine (LANTUS) 100 UNIT/ML Solostar Pen Inject 24 Units into the skin daily. Each morning 12/06/2019: Patient states nurse gave her lantus this morning.  . Lancets (ONETOUCH ULTRASOFT) lancets Use to check blood sugar up to 3 x daily   . Loperamide HCl (IMODIUM PO) Take 1 capsule by mouth daily as needed (diarrhea). Patient reports that this is a liquid form.   . metFORMIN (GLUCOPHAGE) 1000 MG tablet Take 1,000 mg by mouth daily.    . metoprolol succinate (TOPROL-XL) 50 MG 24 hr tablet Take 1 tablet (50 mg total) by mouth daily. Take with or immediately following a meal.   . ondansetron (ZOFRAN) 4 MG tablet Take 1 tablet (4 mg total) by mouth every 6 (six) hours. (Patient taking differently: Take 4 mg by mouth every 8 (eight) hours as needed for nausea or vomiting. )   . ONETOUCH VERIO test strip Check blood sugar up to 5 x daily as needed   . pantoprazole (PROTONIX) 40 MG tablet Take 1 tablet (40 mg total) by mouth 2 (two) times daily before a meal.   . traZODone (DESYREL) 50 MG tablet TAKE 1 TABLET(50 MG) BY MOUTH AT BEDTIME AS NEEDED FOR SLEEP   .  vitamin B-12 1000 MCG tablet Take 1 tablet (1,000 mcg total) by mouth daily.    No facility-administered encounter medications on file as of 12/19/2019.     Objective:  BP Readings from Last 3 Encounters:  12/06/19 130/70  11/28/19 105/67  11/25/19 (!) 145/90    Goals Addressed              This Visit's Progress   .  RNCM: Pt-"I am living on my own now" (pt-stated)        Current Barriers:  . Chronic Disease Managemant support, education, and care coordination needs related to HTN, DMII, and Schizo affective disorder . Limited social support system . Transportation barriers . Financial  constraints   Clinical Goal(s) related to HTN, DMII, and Schizo affective disorder :  Over the next 120 days, patient will: . Work with the care management team to address educational, disease management, and care coordination needs  . Begin or continue self health monitoring activities as directed today The patients daughter, [name] is concerned because the patient is not taking her medications as directed, not doing daily self care, and refusing things just like she did before hospitalization.   . Call provider office for new or worsened signs and symptoms New or worsened symptom related to HTN, DM and Schizoaffective disorder . Call care management team with questions or concerns . Verbalize basic understanding of patient centered plan of care established today   Interventions related to HTN, DMII, and Schizo affective disorder :  . Evaluation of current treatment plans and patient's adherence to plan as established by provider.  12-19-2019: The patient states her chronic conditions are stable. Denies the need to see pcp at this time but admits due to the events of what happened with her daughter earlier in the month she missed appointments. [facility] and pharmacist have assisted with numbers and resources to provider offices, transportation, and other needs the last couple of days. The patient is thankful for the help and support given.  . Assessed patient understanding of disease states: RNCM spoke to the patient for the first time today since working with the patient. The patient was happy and is adjusting to her new living arrangements. She was able to give an accurate recap of her cardiology appointment on Monday. The patient feels she is doing well and wants to stay out of the hospital. 10-24-2019: Attempted to reach the patient but left VM. Call made to the patients daughter [name]. [name] states the patient is doing well health wise but not mentally. The patient does not like living  alone. She is doing okay with it but the daughter wishes the patient would be more engaged with activities and doing things.  She says after the first month that she can see a change in her mental status. The daughter is calling her psychiatrist today to see if she can get her appointment moved up to sooner. 12-19-2019: The patient states that she "messed up" and her and her daughter got in a big argument. The daughter is not speaking to her right now, but the daughter did order groceries for the patient and they were delivered this morning to her home. The patient states that she does not want the CCM team talking to her daughter right now as she does not want her to be more upset with her. She was in the ER early this month but was released back home after cleared from behavioral health. The patient states she is doing the  best she can. She is taking ownership of things and appreciates the help from the CCM team. She called yesterday and rescheduled her appointment to podiatry. She was calling after the [facility] call to check on when she needs her iron infusion. The patient wrote down numbers for the CCM team and numbers for Dr. Ishmael Holter office. She was going to take her medications as soon as she got off of the phone, check her blood sugar and call the provider office to reschedule. . Assessed patient's education and care coordination needs: Working with the LCSW and Pharmacist.  The patient has her medications in a pill container and knows when to take her medications. She verbalized she has multiple medications that she takes but she is taking as prescribed. She is taking her iron supplements along with other Vitamins. 12-19-2019: The patient talked to the pharmacist yesterday and she was able to coordinate with local pharmacy to get all medications delivered and together at one pharmacy. The patient is receiving her medications in bubble packaging. The patient endorses taking medications as prescribed. Discussed  using reminders so she does not forget her medications. Reminded the patient of the critical need for her to take ownership of her health and well being and show how she can be responsible in the decisions she makes about her health and well being. The patient wants to help her self. The patient stated, "I have always depended on other people, but I have to do this now".  Positive reenforcement given to the patient.  . Provided disease specific education to the patient.  The patient is watching her diet and eating a heart Healthy/ADA diet. 12-19-2019: The patient got a grocery delivery this morning and states that she has enough food to last her for 7 days.  She is interested in talking to the care guides about meals on wheels or other food resource locally. Care guide referral has been placed. Her blood sugars are running 150-180.  She had not taken her blood sugar this am. Explained the importance of taking her blood sugars BID and recording. The patient wrote down information on her book.  Education on fasting blood sugars <130 and post prandial <180.  Eduction provided on hyperglycemia and hypoglycemia and the patient making sure she eats balanced meals. The patient verbalized understanding. The patient will keep a record of her blood sugars and blood pressures.  Nash Dimmer with appropriate clinical care team members regarding patient needs.  10-24-2019: Will collaborate with the LCSW. The daughter expressed that the aide was coming in everyday to work with the patient but now only one hour 3 times a week. The patient was benefiting greatly from the aide coming. The daughter ask if this is something that they could get more hours for. Education given and will touch base with LCSW about this. 12-19-2019: Co-collaboration with the pharmacist and LCSW on 12-18-2019.  The CCM team will continue to reach out to the patient on a more frequent basis to check the patient and assist as needed. Will also collaborate  with the pcp for recommendations and suggestions.  . Assessed the for evaluation for psychiatric needs. 12-19-2019: The patient verbalized she has talked with her psychiatrist and she realized the "dating scam" was something she really messed up with. She hates that it has caused distance between her and her daughter. She is talking to her sister that lives in [place] daily.  She knows what she did was not wise and she is dealing with the  consequences. She wants to show her family and others that she is accountable and she can take care of herself. She is appreciative of the help and support of the pcp and CCM team.  . Assessed current living arrangements and safety concerns. : 08-22-2019: The patient is now living in her own apartment that is near her daughter. This is the first time she has ever lived alone. She feels she is adjusting well. The patient sounded very positive and states that home health is working with her and also PT.  Spoke briefly to the patients daughter [name] and she feels this is a positive thing for her mom. 12-19-2019: The patient remains at the senior apartments. She is doing well at this time. She is figuring out how to independently care for herself and make arrangements for the things she needs. She did ask for assistance with resetting her password for myChart. Assistance given and the patient wants to be able to look at notes and see her appointments. . Assessed depression and social isolation needs.  The patient feels safe in her new apartment.  She is working on making friends in the apartment complex and she also is "adjusting well".  She was a little sad when discussing her mother.  Her mother is now in a memory care facility in [place] close to where her brother lives. She gets to talk to her 2 times per week on the phone. This is a huge adjustment but she knows it is for the best. She says her daughter will take her for a visit soon. She is decorating her apartment and  will have all her furniture next week. She is enjoying taking a shower and feels she is doing a good job at taking care of herself. 12-19-2019: The patient is doing well today. Is making progress with rescheduling appointments and arranging transporation. Working with resources for the expressed needs she has. Knows the CCM team is here to help and support as needed.   Patient Self Care Activities related to HTN, DMII, and schizo affective disorder :  . Patient is unable to independently self-manage chronic health conditions  Please see past updates related to this goal by clicking on the "Past Updates" button in the selected goal  dates.         Plan:   Telephone follow up appointment with care management team member scheduled for: 01-30-2020 at 11:30 am   [place], MSN, [place] Mobile: [phone]

## 2019-12-20 ENCOUNTER — Ambulatory Visit: Payer: Self-pay | Admitting: Pharmacist

## 2019-12-20 ENCOUNTER — Telehealth: Payer: Self-pay

## 2019-12-20 DIAGNOSIS — I10 Essential (primary) hypertension: Secondary | ICD-10-CM

## 2019-12-20 DIAGNOSIS — IMO0002 Reserved for concepts with insufficient information to code with codable children: Secondary | ICD-10-CM

## 2019-12-20 DIAGNOSIS — E1149 Type 2 diabetes mellitus with other diabetic neurological complication: Secondary | ICD-10-CM

## 2019-12-20 NOTE — Telephone Encounter (Signed)
Copied from [place] [id number]. Topic: General - Inquiry >> Dec 02, 2019  4:13 PM Percell Belt A wrote: Reason for CRM: pt called in and would like to know if Dr could pt in an order for a seated walker.  She does not have a certain that she would like it sent.  Just somewhere that her ins will cover  Best number [phone]

## 2019-12-20 NOTE — Telephone Encounter (Signed)
Copied from [place] [id number]. Topic: General - Inquiry >> Dec 02, 2019  4:13 PM [name] wrote: Reason for CRM: pt called in and would like to know if Dr could pt in an order for [name] seated walker.  She does not have [name] certain that she would like it sent.  Just somewhere that her ins will cover  Best number [phone] >> Dec 20, 2019  2:06 PM [name] wrote: Pt called saying the order for her seated walker was sent to the [facility] in [place] and they will not file her insurance.  Pt states she has [name] place she can get it from that will file insurance and ins will pay for it.    The company is [employer] [phone]  The fax number for the order is [phone]

## 2019-12-20 NOTE — Patient Instructions (Signed)
Thank you allowing the Chronic Care Management Team to be a part of your care! It was a pleasure speaking with you today!     CCM (Chronic Care Management) Team    Noreene Larsson RN, MSN, CCM Nurse Care Coordinator  [phone]   Harlow Asa PharmD  Clinical Pharmacist  [phone]   [name] LCSW Clinical Social Worker [phone]  Visit Information  Goals Addressed            This Visit's Progress   . PharmD - Medication Management       Current Barriers:  . Financial Barriers . Non adherence to prescribed medication regimen . Lack of support  Pharmacist Clinical Goal(s):  [name] Over the next 30 days, patient will work with [name] to address needs related to optimization of medication regimen and improvement of medication adherence  Interventions: . Follow up with patient regarding medication adherence/bubble packaging from [employer] on importance of taking medications, including insulin, as directed. o Patient's nurses aide, [name], arrives during our call.  - Patient states will ask [name] to help setup daily medication administration reminder alarm on phone - Will also ask [name] to help with setting up her voicemail . Place coordination of care call to [facility] with patient on line. Speak with [name] confirms received Rxs as sent by PCP and called [facility] for transfer of aripiprazole and bupropion Rxs. o Review medication list and [name] confirms pharmacy now has all active Rx for patient o [name] states next bubble pack delivery to go out to patient on 8/28. - Patient states will need refill of aripiprazole and bupropion Rx prior to that time.  - [name] confirms pharmacy will deliver patient a 1 week supply for patient to take from bottle until bubble pack arrives o Patient to provide her current supplies of OTC items (ferrous sulfate and Vitamin B12) to delivery driver to be returned to [place] for  addition to bubble pack . Follow up regarding coordination of care o Ms. Dowding planning to make calls today with help of aide to: - Reschedule appointment with [name] - Reschedule colonoscopy - Call [place] about receiving walker - [place] at [facility] about being seen by therapist (has confirmed in-network with health plan) o Provide patient with phone numbers for providers as requested o Confirms will call [place] for transportation for appointments as well, as was advised 3-4 days prior to each appointment . Discuss importance of COVID-19 infection prevention and vaccination o Patient requests assistance with determining if she received a dose of COVID-19 vaccine while in SNF at beginning of year . Place coordination of care call to [facility] SN[facility]. Speak with Will who reports patient received only 1st dose of Moderna COVID-19 vaccination while at facility on 06/10/19. o Follow up with patient to let her know. States that she will follow up with pharmacy about scheduling appointment for second dose. . Will collaborate with CCM Nurse Case Manager and CCM Social Worker  Patient Self Care Activities:  . Currently UNABLE to self administers medications as prescribed o Starting pill packaging with [employer] . Currently UNABLE to attend all scheduled provider appointments . Calls pharmacy for medication refills . Calls provider office for new concerns or questions . Patient to check blood sugar as directed by Endocrinologist and keep log  Please see past updates related to this goal by clicking on the "Past  Updates" button in the selected goal         Patient verbalizes understanding of instructions provided today.   Telephone follow up appointment with care management team member scheduled for: 8/27 at 10:30 am  Harlow Asa, PharmD, [place] [phone]

## 2019-12-20 NOTE — Chronic Care Management (AMB) (Signed)
Chronic Care Management   Follow Up Note   12/20/2019 Name: [name] MRN: [id number] DOB: 05-Jun-1965  Referred by: [name], DO Reason for referral : Chronic Care Management (Patient Phone Call) and Care Coordination [facility])   [name] is a 54 y.o. year old female who is a primary care patient of [name], DO. The CCM team was consulted for assistance with chronic disease management and care coordination needs.    I reached out to [name] by phone today.   Place coordination of care call to [facility].  Place coordination of care call to [facility].  Review of patient status, including review of consultants reports, relevant laboratory and other test results, and collaboration with appropriate care team members and the patient's provider was performed as part of comprehensive patient evaluation and provision of chronic care management services.    SDOH (Social Determinants of Health) assessments performed: No See Care Plan activities for detailed interventions related to [facility])     Outpatient Encounter Medications as of 12/20/2019  Medication Sig Note  . acetaminophen (TYLENOL) 325 MG tablet Take 2 tablets (650 mg total) by mouth every 6 (six) hours as needed for mild pain.   . ARIPiprazole (ABILIFY) 10 MG tablet Take 10 mg by mouth at bedtime.    [name] aspirin EC 81 MG tablet Take 1 tablet (81 mg total) by mouth daily.   [name] atorvastatin (LIPITOR) 40 MG tablet Take 1 tablet (40 mg total) by mouth daily.   . Blood Glucose Monitoring Suppl (ONETOUCH VERIO) w/Device KIT Use to check blood sugar up to 3 x daily   . buPROPion (WELLBUTRIN XL) 300 MG 24 hr tablet Take 300 mg by mouth daily.   . colestipol (COLESTID) 1 g tablet TAKE (2) TABLETS BY MOUTH TWICE DAILY. (Patient taking differently: Take 2 g by mouth 2 (two) times daily. ) 12/06/2019: Patient is not 100% sure if she takes this med or  not. According to dispense hx, patient has been getting it filled.  . Ferrous Sulfate 27 MG TABS Take 27 mg by mouth daily.    . furosemide (LASIX) 40 MG tablet Take 1 tablet (40 mg total) by mouth daily.   [name] gabapentin (NEURONTIN) 300 MG capsule Take 1 capsule (300 mg total) by mouth at bedtime.   . hydrochlorothiazide (HYDRODIURIL) 12.5 MG tablet Take 1 tablet (12.5 mg total) by mouth daily.   . insulin glargine (LANTUS) 100 UNIT/ML Solostar Pen Inject 24 Units into the skin daily. Each morning 12/06/2019: Patient states nurse gave her lantus this morning.  . Lancets (ONETOUCH ULTRASOFT) lancets Use to check blood sugar up to 3 x daily   . Loperamide HCl (IMODIUM PO) Take 1 capsule by mouth daily as needed (diarrhea). Patient reports that this is a liquid form.   . metFORMIN (GLUCOPHAGE) 1000 MG tablet Take 1,000 mg by mouth daily.    . metoprolol succinate (TOPROL-XL) 50 MG 24 hr tablet Take 1 tablet (50 mg total) by mouth daily. Take with or immediately following a meal.   . ondansetron (ZOFRAN) 4 MG tablet Take 1 tablet (4 mg total) by mouth every 6 (six) hours. (Patient taking differently: Take 4 mg by mouth every 8 (eight) hours as needed for nausea or vomiting. )   . ONETOUCH VERIO test strip Check blood sugar up to 5 x daily as needed   . pantoprazole (PROTONIX) 40 MG tablet Take 1 tablet (40 mg total)  by mouth 2 (two) times daily before a meal.   . traZODone (DESYREL) 50 MG tablet TAKE 1 TABLET(50 MG) BY MOUTH AT BEDTIME AS NEEDED FOR SLEEP   . vitamin B-12 1000 MCG tablet Take 1 tablet (1,000 mcg total) by mouth daily.    No facility-administered encounter medications on file as of 12/20/2019.    Goals Addressed            This Visit's Progress   . PharmD - Medication Management       Current Barriers:  . Financial Barriers . Non adherence to prescribed medication regimen . Lack of support  Pharmacist Clinical Goal(s):  [name] Over the next 30 days, patient will work with CM  Pharmacist to address needs related to optimization of medication regimen and improvement of medication adherence  Interventions: . Follow up with patient regarding medication adherence/bubble packaging from [employer] on importance of taking medications, including insulin, as directed. o Patient's nurses aide, [name], arrives during our call.  - Patient states will ask [name] to help setup daily medication administration reminder alarm on phone - Will also ask [name] to help with setting up her voicemail . Place coordination of care call to [facility] with patient on line. Speak with [name] confirms received Rxs as sent by PCP and called [facility] for transfer of aripiprazole and bupropion Rxs. o Review medication list and [name] confirms pharmacy now has all active Rx for patient o [name] states next bubble pack delivery to go out to patient on 8/28. - Patient states will need refill of aripiprazole and bupropion Rx prior to that time.  - [name] confirms pharmacy will deliver patient a 1 week supply for patient to take from bottle until bubble pack arrives o Patient to provide her current supplies of OTC items (ferrous sulfate and Vitamin B12) to delivery driver to be returned to [place] for addition to bubble pack . Follow up regarding coordination of care o [name] planning to make calls today with help of aide to: - Reschedule appointment with [name] - Reschedule colonoscopy - Call [facility] about receiving walker - [name] at [facility] about being seen by therapist (has confirmed in-network with health plan) o Provide patient with phone numbers for providers as requested o Confirms will call [place] for transportation for appointments as well, as was advised 3-4 days prior to each appointment . Discuss importance of COVID-19 infection prevention and  vaccination o Patient requests assistance with determining if she received a dose of COVID-19 vaccine while in SNF at beginning of year . Place coordination of care call to [facility]. Speak with Will who reports patient received only 1st dose of Moderna COVID-19 vaccination while at facility on 06/10/19. o Follow up with patient to let her know. States that she will follow up with pharmacy about scheduling appointment for second dose. . Will collaborate with CCM Nurse Case Manager and CCM Social Worker  Patient Self Care Activities:  . Currently UNABLE to self administers medications as prescribed o Starting pill packaging with [employer] . Currently UNABLE to attend all scheduled provider appointments . Calls pharmacy for medication refills . Calls provider office for new concerns or questions . Patient to check blood sugar as directed by Endocrinologist and keep log  Please see past updates related to this goal by clicking on the "Past Updates" button in the selected goal  Plan  Telephone follow up appointment with care management team member scheduled for: 8/27 at 10:30 am  Harlow Asa, PharmD, [facility] [phone]

## 2019-12-23 NOTE — Telephone Encounter (Signed)
Alright. New rx written, added height and weight.  Please send to [facility] - fax number for the order is [phone]  Will be in my outbox ready for fax likely tomorrow 8/24  [name], [facility] 12/23/2019, 12:10 PM

## 2019-12-23 NOTE — Telephone Encounter (Signed)
This should have already been completed.  Telephone Note on 12/03/19, says it was faxed on 12/06/19.  Here is a copy  [address] 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

## 2019-12-24 ENCOUNTER — Telehealth: Payer: Self-pay | Admitting: Family Medicine

## 2019-12-24 ENCOUNTER — Ambulatory Visit: Payer: Self-pay | Admitting: Licensed Clinical Social Worker

## 2019-12-24 ENCOUNTER — Other Ambulatory Visit: Payer: Self-pay | Admitting: Family Medicine

## 2019-12-24 DIAGNOSIS — K909 Intestinal malabsorption, unspecified: Secondary | ICD-10-CM

## 2019-12-24 DIAGNOSIS — E1149 Type 2 diabetes mellitus with other diabetic neurological complication: Secondary | ICD-10-CM

## 2019-12-24 DIAGNOSIS — E1169 Type 2 diabetes mellitus with other specified complication: Secondary | ICD-10-CM

## 2019-12-24 DIAGNOSIS — E785 Hyperlipidemia, unspecified: Secondary | ICD-10-CM

## 2019-12-24 DIAGNOSIS — Z9049 Acquired absence of other specified parts of digestive tract: Secondary | ICD-10-CM

## 2019-12-24 DIAGNOSIS — IMO0002 Reserved for concepts with insufficient information to code with codable children: Secondary | ICD-10-CM

## 2019-12-24 DIAGNOSIS — E875 Hyperkalemia: Secondary | ICD-10-CM

## 2019-12-24 DIAGNOSIS — I10 Essential (primary) hypertension: Secondary | ICD-10-CM

## 2019-12-24 DIAGNOSIS — F251 Schizoaffective disorder, depressive type: Secondary | ICD-10-CM

## 2019-12-24 NOTE — Telephone Encounter (Signed)
Unable to fax --failed multiple time called patient unable to reach so called daughter and confirm that we can mail the Rx and they can take the Rx to [facility].

## 2019-12-24 NOTE — Chronic Care Management (AMB) (Signed)
Chronic Care Management    Clinical Social Work Follow Up Note  12/24/2019 Name: [name] MRN: [id number] DOB: March 05, 1966  [name] is a 54 y.o. year old female who is a primary care patient of [name], DO. The CCM team was consulted for assistance with Level of Care Concerns.   Review of patient status, including review of consultants reports, other relevant assessments, and collaboration with appropriate care team members and the patient's provider was performed as part of comprehensive patient evaluation and provision of chronic care management services.    SDOH (Social Determinants of Health) assessments performed: Yes    Outpatient Encounter Medications as of 12/24/2019  Medication Sig Note  . acetaminophen (TYLENOL) 325 MG tablet Take 2 tablets (650 mg total) by mouth every 6 (six) hours as needed for mild pain.   . ARIPiprazole (ABILIFY) 10 MG tablet Take 10 mg by mouth at bedtime.    [name] aspirin EC 81 MG tablet Take 1 tablet (81 mg total) by mouth daily.   [name] atorvastatin (LIPITOR) 40 MG tablet Take 1 tablet (40 mg total) by mouth daily.   . Blood Glucose Monitoring Suppl (ONETOUCH VERIO) w/Device KIT Use to check blood sugar up to 3 x daily   . buPROPion (WELLBUTRIN XL) 300 MG 24 hr tablet Take 300 mg by mouth daily.   . colestipol (COLESTID) 1 g tablet TAKE (2) TABLETS BY MOUTH TWICE DAILY.   [name] Ferrous Sulfate 27 MG TABS Take 27 mg by mouth daily.    . furosemide (LASIX) 40 MG tablet Take 1 tablet (40 mg total) by mouth daily.   [name] gabapentin (NEURONTIN) 300 MG capsule Take 1 capsule (300 mg total) by mouth at bedtime.   . hydrochlorothiazide (HYDRODIURIL) 12.5 MG tablet Take 1 tablet (12.5 mg total) by mouth daily.   . insulin glargine (LANTUS) 100 UNIT/ML Solostar Pen Inject 24 Units into the skin daily. Each morning 12/06/2019: Patient states nurse gave her lantus this morning.  . Lancets (ONETOUCH ULTRASOFT) lancets Use to check blood sugar up  to 3 x daily   . Loperamide HCl (IMODIUM PO) Take 1 capsule by mouth daily as needed (diarrhea). Patient reports that this is a liquid form.   . metFORMIN (GLUCOPHAGE) 1000 MG tablet Take 1,000 mg by mouth daily.    . metoprolol succinate (TOPROL-XL) 50 MG 24 hr tablet Take 1 tablet (50 mg total) by mouth daily. Take with or immediately following a meal.   . ondansetron (ZOFRAN) 4 MG tablet Take 1 tablet (4 mg total) by mouth every 6 (six) hours. (Patient taking differently: Take 4 mg by mouth every 8 (eight) hours as needed for nausea or vomiting. )   . ONETOUCH VERIO test strip Check blood sugar up to 5 x daily as needed   . pantoprazole (PROTONIX) 40 MG tablet Take 1 tablet (40 mg total) by mouth 2 (two) times daily before a meal.   . traZODone (DESYREL) 50 MG tablet TAKE 1 TABLET(50 MG) BY MOUTH AT BEDTIME AS NEEDED FOR SLEEP   . vitamin B-12 1000 MCG tablet Take 1 tablet (1,000 mcg total) by mouth daily.    No facility-administered encounter medications on file as of 12/24/2019.     Goals Addressed    .  SW- "I want to manage my health conditions and get better." (pt-stated)        Current Barriers:  . Financial constraints related to managing health care within the home . Limited social  support . ADL IADL limitations . Social Isolation . Inability to perform ADL's independently . Inability to perform IADL's independently  Clinical Social Work Clinical Goal(s):   [name] Over the next 120 days, patient/caregiver will work with SW to address concerns related to lack of support/resource connection. LCSW will assist patient in gaining additional support/resource connection and community resource education in order to maintain health and mental health appropriately  . Over the next 120 days, patient will demonstrate improved adherence to self care as evidenced by implementing healthy self-care into her daily routine such as: attending all medical appointments, deep breathing exercises, taking  time for self-reflection, taking medications as prescribed, drinking water and daily exercise to improve mobility.  . Over the next 120 days, patient will demonstrate improved health management independence as evidenced by implementing healthy self-care skills and positive support/resources into her daily routine to help cope with stressors and improve overall health and well-being  Interventions: . Discussed plans with patient for ongoing care management follow up and provided patient with direct contact information for care management team . Provided education and assistance to client regarding Advanced Directives. . A voluntary and extensive discussion about advanced care planning including explanation and discussion of advanced was undertaken with the patient and daughter.  Explanation regarding healthcare proxy and living will was reviewed and packet with forms with explanation of how to fill them out was given.   . Provided education to patient/caregiver regarding level of care options. . Provided education to patient/caregiver about Hospice and/or Palliative Care services . LCSW discussed coping skills for managing health care. SW used active and reflective listening, validated patient's daughters feelings/concerns, and provided emotional support. LCSW provided self-care education to caregiver help manage her mother's multiple health conditions and improve her overall mood. Daughter reports that patient is actively receiving mental health services at [facility] with [name].  . Daughter reports that she can no longer take care of patient in the home now that she is refusing care. Daughter agreeable to APS report. Patient went to ED yesterday. Patient's blood sugars and plus remain low. CCM LCSW and CCM RNCM collaborated with [name] today after receiving your message and she is agreeable to take patient to [facility] for psych evaluation today on 05/22/19. We called APS and  filed a report but they are requesting a signed letter from the PCP stating her competency level. They almost did not want to take the case without that. [name] and I challenged that several times with APS caseworker. CCM team sent in basket message to Gabriela. Raliegh Ip asking if we could get a signed letter about our specific concerns in regards to her ability to make decisions for self, neglecting her physical and mental health care, refusing test at hospital on 05/21/2019, non-compliance issues with taking medications, taking baths, attending follow up appointments and not sharing information with family, etc. This letter can be emailed to DSS at [email or web address] UPDATE-Patient has since discharged from [facility] SNF and is back home and taking appropriate care of herself without any urgent concerns. APS case is now closed.  . Patient reports that her anxiety has increased lately due to ongoing life stressors. Patient shares that she is having difficulty keeping up with taking her medications and managing her health care. Patient reports that she has made improvement with asking for help when needed. LCSW provided positive reinforcement for this act of self-care. LCSW discussed coping skills for anxiety management. SW used empathetic and active  and reflective listening, validated patient's feelings/concerns, and provided emotional support. LCSW provided self-care education to help manage her multiple health conditions and improve her mood.  . Patient missed her podiatrist appointment last week due to lack of stable transportation. Patient reports that she is in need of transportation assistance now as her daughter is unable to help out with this as much due to her son having health conditions. LCSW will complete C3 referral for transportation assistance. UPDATE- Patient reports that she missed a call from [facility]. LCSW provided patient with contact information and encouraged her to call them back today.  Patient was agreeable. . Patient was reminded that she will need to contact her insurance to find out which agency accepts her insurance so that she can receive a rolling walker with a seat. Patient agreeable to contact her insurance and provide update to PCP once an agency has been decided so that he can complete RX.   Patient Self Care Activities:  . Attends all scheduled provider appointments . Calls provider office for new concerns or questions . Lacks social connections  Please see past updates related to this goal by clicking on the "Past Updates" button in the selected goal       Follow Up Plan: SW will follow up with patient by phone over the next 60 days  [name], [employer], MSW, [email or web address] Phone: [phone]

## 2019-12-27 ENCOUNTER — Telehealth: Payer: Self-pay

## 2019-12-27 ENCOUNTER — Telehealth: Payer: Self-pay | Admitting: Pharmacist

## 2019-12-27 NOTE — Telephone Encounter (Signed)
  Chronic Care Management   Outreach Note  12/27/2019 Name: [name] MRN: [id number] DOB: Feb 18, 1966  Referred by: [name], DO Reason for referral : No chief complaint on file.  Was unable to reach patient via telephone today and unable to leave a message as patient's voicemail is not setup.   Follow Up Plan: Will collaborate with Care Guide to outreach to schedule follow up with me  Harlow Asa, PharmD, [facility] [phone]

## 2019-12-30 ENCOUNTER — Telehealth: Payer: Self-pay

## 2019-12-30 NOTE — Chronic Care Management (AMB) (Signed)
  Care Management   Note  12/30/2019 Name: [name] MRN: [id number] DOB: 22-Oct-1965  [name] is a 54 y.o. year old female who is a primary care patient of [name], DO and is actively engaged with the care management team. I reached out to [name] by phone today to assist with re-scheduling a follow up visit with the Pharmacist  Follow up plan: Unsuccessful telephone outreach attempt made. A HIPPA compliant phone message was left for the patient providing contact information and requesting a return call.  The care management team will reach out to the patient again over the next 7 days.  If patient returns call to provider office, please advise to call [facility]  at [place], [place], [place], [place] [address] Direct Dial: [phone] [email or web address]: [email or web address]

## 2019-12-31 ENCOUNTER — Ambulatory Visit: Payer: Self-pay | Admitting: Licensed Clinical Social Worker

## 2019-12-31 DIAGNOSIS — I10 Essential (primary) hypertension: Secondary | ICD-10-CM | POA: Diagnosis not present

## 2019-12-31 DIAGNOSIS — E1169 Type 2 diabetes mellitus with other specified complication: Secondary | ICD-10-CM | POA: Diagnosis not present

## 2019-12-31 DIAGNOSIS — G629 Polyneuropathy, unspecified: Secondary | ICD-10-CM

## 2019-12-31 DIAGNOSIS — E1165 Type 2 diabetes mellitus with hyperglycemia: Secondary | ICD-10-CM | POA: Diagnosis not present

## 2019-12-31 DIAGNOSIS — E1149 Type 2 diabetes mellitus with other diabetic neurological complication: Secondary | ICD-10-CM | POA: Diagnosis not present

## 2019-12-31 DIAGNOSIS — E785 Hyperlipidemia, unspecified: Secondary | ICD-10-CM

## 2019-12-31 DIAGNOSIS — IMO0002 Reserved for concepts with insufficient information to code with codable children: Secondary | ICD-10-CM

## 2019-12-31 DIAGNOSIS — F251 Schizoaffective disorder, depressive type: Secondary | ICD-10-CM

## 2019-12-31 NOTE — Chronic Care Management (AMB) (Signed)
Chronic Care Management    Clinical Social Work Follow Up Note  12/31/2019 Name: [name] MRN: [id number] DOB: May 15, 1965  [name] is a 54 y.o. year old female who is a primary care patient of [name], DO. The CCM team was consulted for assistance with Level of Care Concerns.   Review of patient status, including review of consultants reports, other relevant assessments, and collaboration with appropriate care team members and the patient's provider was performed as part of comprehensive patient evaluation and provision of chronic care management services.    SDOH (Social Determinants of Health) assessments performed: Yes    Outpatient Encounter Medications as of 12/31/2019  Medication Sig Note  . acetaminophen (TYLENOL) 325 MG tablet Take 2 tablets (650 mg total) by mouth every 6 (six) hours as needed for mild pain.   . ARIPiprazole (ABILIFY) 10 MG tablet Take 10 mg by mouth at bedtime.    [name] aspirin EC 81 MG tablet Take 1 tablet (81 mg total) by mouth daily.   [name] atorvastatin (LIPITOR) 40 MG tablet Take 1 tablet (40 mg total) by mouth daily.   . Blood Glucose Monitoring Suppl (ONETOUCH VERIO) w/Device KIT Use to check blood sugar up to 3 x daily   . buPROPion (WELLBUTRIN XL) 300 MG 24 hr tablet Take 300 mg by mouth daily.   . colestipol (COLESTID) 1 g tablet TAKE (2) TABLETS BY MOUTH TWICE DAILY.   [name] Ferrous Sulfate 27 MG TABS Take 27 mg by mouth daily.    . furosemide (LASIX) 40 MG tablet Take 1 tablet (40 mg total) by mouth daily.   [name] gabapentin (NEURONTIN) 300 MG capsule Take 1 capsule (300 mg total) by mouth at bedtime.   . hydrochlorothiazide (HYDRODIURIL) 12.5 MG tablet Take 1 tablet (12.5 mg total) by mouth daily.   . insulin glargine (LANTUS) 100 UNIT/ML Solostar Pen Inject 24 Units into the skin daily. Each morning 12/06/2019: Patient states nurse gave her lantus this morning.  . Lancets (ONETOUCH ULTRASOFT) lancets Use to check blood sugar up  to 3 x daily   . Loperamide HCl (IMODIUM PO) Take 1 capsule by mouth daily as needed (diarrhea). Patient reports that this is a liquid form.   . metFORMIN (GLUCOPHAGE) 1000 MG tablet Take 1,000 mg by mouth daily.    . metoprolol succinate (TOPROL-XL) 50 MG 24 hr tablet Take 1 tablet (50 mg total) by mouth daily. Take with or immediately following a meal.   . ondansetron (ZOFRAN) 4 MG tablet Take 1 tablet (4 mg total) by mouth every 6 (six) hours. (Patient taking differently: Take 4 mg by mouth every 8 (eight) hours as needed for nausea or vomiting. )   . ONETOUCH VERIO test strip Check blood sugar up to 5 x daily as needed   . pantoprazole (PROTONIX) 40 MG tablet Take 1 tablet (40 mg total) by mouth 2 (two) times daily before a meal.   . traZODone (DESYREL) 50 MG tablet TAKE 1 TABLET(50 MG) BY MOUTH AT BEDTIME AS NEEDED FOR SLEEP   . vitamin B-12 1000 MCG tablet Take 1 tablet (1,000 mcg total) by mouth daily.    No facility-administered encounter medications on file as of 12/31/2019.     Goals Addressed    .  SW- "I want to manage my health conditions and get better." (pt-stated)        Current Barriers:  . Financial constraints related to managing health care within the home . Limited social  support . ADL IADL limitations . Social Isolation . Inability to perform ADL's independently . Inability to perform IADL's independently  Clinical Social Work Clinical Goal(s):   [name] Over the next 120 days, patient/caregiver will work with SW to address concerns related to lack of support/resource connection. LCSW will assist patient in gaining additional support/resource connection and community resource education in order to maintain health and mental health appropriately  . Over the next 120 days, patient will demonstrate improved adherence to self care as evidenced by implementing healthy self-care into her daily routine such as: attending all medical appointments, deep breathing exercises, taking  time for self-reflection, taking medications as prescribed, drinking water and daily exercise to improve mobility.  . Over the next 120 days, patient will demonstrate improved health management independence as evidenced by implementing healthy self-care skills and positive support/resources into her daily routine to help cope with stressors and improve overall health and well-being  Interventions: . Discussed plans with patient for ongoing care management follow up and provided patient with direct contact information for care management team . Provided education and assistance to client regarding Advanced Directives. . A voluntary and extensive discussion about advanced care planning including explanation and discussion of advanced was undertaken with the patient and daughter.  Explanation regarding healthcare proxy and living will was reviewed and packet with forms with explanation of how to fill them out was given.   . Provided education to patient/caregiver regarding level of care options. . Provided education to patient/caregiver about Hospice and/or Palliative Care services . LCSW discussed coping skills for managing health care. SW used active and reflective listening, validated patient's daughters feelings/concerns, and provided emotional support. LCSW provided self-care education to caregiver help manage her mother's multiple health conditions and improve her overall mood. Daughter reports that patient is actively receiving mental health services at [facility] with [name].  . Patient reports that her depression is low today. She reports that her moods change from day to day.  . Past APS report was placed for our specific concerns in regards to patient's ability to make decisions for self, neglecting her physical and mental health care, non-compliance issues with taking medications, taking baths, attending follow up appointments and not sharing information with family, etc. APS case completed their  investigation and case is now closed.  . Patient reports that her anxiety has increased lately due to ongoing life stressors. Patient shares that she is having difficulty keeping up with taking her medications and managing her health care. Patient reports that she has made improvement with asking for help when needed. LCSW provided positive reinforcement for this act of self-care. LCSW discussed coping skills for anxiety management. SW used empathetic and active and reflective listening, validated patient's feelings/concerns, and provided emotional support. LCSW provided self-care education to help manage her multiple health conditions and improve her mood.  . Patient missed her podiatrist appointment last week due to lack of stable transportation. Patient reports that she is in need of transportation assistance now as her daughter is unable to help out with this as much due to her son having health conditions. LCSW will complete C3 referral for transportation assistance. UPDATE- Patient was reminded that she missed a call from [facility] Pharmacist. LCSW provided patient with contact information and encouraged her to call them back today. Patient was agreeable. . Patient was reminded that she will need to contact her insurance to find out which agency accepts her insurance so that she can receive a  rolling walker with a seat. Patient agreeable to contact her insurance and provide update to PCP once an agency has been decided so that he can complete RX.  [name] Patient reports that her daughter bought her healthy food groceries which has upset her stomach. Patient shares that she is having difficulty adjusting to this diet but understands it is what is best for her health. Patient reports experiencing diarrhea yesterday but not today. Patient reports that her daughter is still involved in her care.  . Patient admits that she did not take her prescribed medications for the last two days and reports  disappointment in herself over this. Self-care education was provided to patient. Patient was encouraged to set a daily alarm on her cell phone which will remind her to take her medications in the morning and the same time everyday.   Patient Self Care Activities:  . Attends all scheduled provider appointments . Calls provider office for new concerns or questions . Lacks social connections  Please see past updates related to this goal by clicking on the "Past Updates" button in the selected goal      Follow Up Plan: SW will follow up with patient by phone over the next 90 days  [name], [employer], MSW, [email or web address] Phone: [phone]

## 2020-01-03 DIAGNOSIS — M14672 Charcot's joint, left ankle and foot: Secondary | ICD-10-CM | POA: Diagnosis not present

## 2020-01-08 NOTE — Telephone Encounter (Signed)
Pt has been r/s for 01/22/2020

## 2020-01-08 NOTE — Chronic Care Management (AMB) (Signed)
  Care Management   Note  01/08/2020 Name: [name] MRN: [id number] DOB: 12-16-1965  [name] is a 54 y.o. year old female who is a primary care patient of [name], DO and is actively engaged with the care management team. I reached out to [name] by phone today to assist with re-scheduling a follow up visit with the Pharmacist  Follow up plan: Telephone appointment with care management team member scheduled for:01/22/2020  Noreene Larsson, [place], [place], [place] [address] Direct Dial: [phone] [email or web address] Website: [email or web address]

## 2020-01-20 ENCOUNTER — Other Ambulatory Visit: Payer: Self-pay | Admitting: Family Medicine

## 2020-01-20 DIAGNOSIS — R197 Diarrhea, unspecified: Secondary | ICD-10-CM

## 2020-01-20 DIAGNOSIS — K909 Intestinal malabsorption, unspecified: Secondary | ICD-10-CM

## 2020-01-20 DIAGNOSIS — Z9049 Acquired absence of other specified parts of digestive tract: Secondary | ICD-10-CM

## 2020-01-20 NOTE — Telephone Encounter (Signed)
Requested Prescriptions  Pending Prescriptions Disp Refills   colestipol (COLESTID) 1 g tablet [Pharmacy Med Name: COLESTIPOL HCL 1 GM TABLET] 120 tablet 0    Sig: TAKE (2) TABLETS BY MOUTH TWICE DAILY.     There is no refill protocol information for this order

## 2020-01-21 ENCOUNTER — Other Ambulatory Visit: Payer: Self-pay | Admitting: Family Medicine

## 2020-01-21 DIAGNOSIS — F5104 Psychophysiologic insomnia: Secondary | ICD-10-CM

## 2020-01-21 NOTE — Telephone Encounter (Signed)
Requested medication (s) are due for refill today - yes  Requested medication (s) are on the active medication list -yes  Future visit scheduled -no  Last refill: 10/10/19 #30 2RF  Notes to clinic: Request for RF- no protocol showing  Requested Prescriptions  Pending Prescriptions Disp Refills   traZODone (DESYREL) 50 MG tablet [Pharmacy Med Name: TRAZODONE 50 MG TABLET] 30 tablet 0    Sig: TAKE 1 TABLET BY MOUTH AT BEDTIME AS NEEDED FOR SLEEP.      There is no refill protocol information for this order        Requested Prescriptions  Pending Prescriptions Disp Refills   traZODone (DESYREL) 50 MG tablet [Pharmacy Med Name: TRAZODONE 50 MG TABLET] 30 tablet 0    Sig: TAKE 1 TABLET BY MOUTH AT BEDTIME AS NEEDED FOR SLEEP.      There is no refill protocol information for this order

## 2020-01-22 ENCOUNTER — Telehealth: Payer: Self-pay | Admitting: Pharmacist

## 2020-01-22 ENCOUNTER — Telehealth: Payer: Medicare Other

## 2020-01-22 NOTE — Telephone Encounter (Signed)
  Chronic Care Management   Outreach Note  01/22/2020 Name: [name] MRN: [id number] DOB: Apr 13, 1966  Referred by: [name], DO Reason for referral : No chief complaint on file.  Was unable to reach patient via telephone today and have left HIPAA compliant voicemail asking patient to return my call. (unsuccessful outreach #2)    Follow Up Plan: Will collaborate with Care Guide to outreach to schedule follow up with me  Harlow Asa, PharmD, [facility] [phone]

## 2020-01-24 ENCOUNTER — Telehealth: Payer: Self-pay

## 2020-01-24 NOTE — Chronic Care Management (AMB) (Signed)
°  Care Management   Note  01/24/2020 Name: [name] MRN: [id number] DOB: [birth year]  [name] is a 54 y.o. year old female who is a primary care patient of [name], DO and is actively engaged with the care management team. I reached out to [name] by phone today to assist with re-scheduling a follow up visit with the Pharmacist  Follow up plan: Unsuccessful telephone outreach attempt made. The care management team will reach out to the patient again over the next 7 days.  If patient returns call to provider office, please advise to call [facility]  at [place], [place], [place], [place] [address] Direct Dial: [phone] [email or web address] Website: [email or web address]

## 2020-01-30 ENCOUNTER — Telehealth: Payer: Self-pay | Admitting: General Practice

## 2020-01-30 ENCOUNTER — Ambulatory Visit (INDEPENDENT_AMBULATORY_CARE_PROVIDER_SITE_OTHER): Payer: Medicare Other | Admitting: General Practice

## 2020-01-30 DIAGNOSIS — E1169 Type 2 diabetes mellitus with other specified complication: Secondary | ICD-10-CM

## 2020-01-30 DIAGNOSIS — E1149 Type 2 diabetes mellitus with other diabetic neurological complication: Secondary | ICD-10-CM

## 2020-01-30 DIAGNOSIS — E1165 Type 2 diabetes mellitus with hyperglycemia: Secondary | ICD-10-CM | POA: Diagnosis not present

## 2020-01-30 DIAGNOSIS — F251 Schizoaffective disorder, depressive type: Secondary | ICD-10-CM

## 2020-01-30 DIAGNOSIS — E785 Hyperlipidemia, unspecified: Secondary | ICD-10-CM

## 2020-01-30 DIAGNOSIS — I10 Essential (primary) hypertension: Secondary | ICD-10-CM | POA: Diagnosis not present

## 2020-01-30 DIAGNOSIS — IMO0002 Reserved for concepts with insufficient information to code with codable children: Secondary | ICD-10-CM

## 2020-01-30 NOTE — Patient Instructions (Signed)
Visit Information  Goals Addressed              This Visit's Progress   .  RNCM: Pt-"I am living on my own now" (pt-stated)        Current Barriers:  . Chronic Disease Managemant support, education, and care coordination needs related to HTN, DMII, and Schizo affective disorder . Limited social support system . Transportation barriers . Financial constraints   Clinical Goal(s) related to HTN, DMII, and Schizo affective disorder :  Over the next 120 days, patient will: . Work with the care management team to address educational, disease management, and care coordination needs  . Begin or continue self health monitoring activities as directed today The patients daughter, [name] is concerned because the patient is not taking her medications as directed, not doing daily self care, and refusing things just like she did before hospitalization.   . Call provider office for new or worsened signs and symptoms New or worsened symptom related to HTN, DM and Schizoaffective disorder . Call care management team with questions or concerns . Verbalize basic understanding of patient centered plan of care established today   Interventions related to HTN, DMII, and Schizo affective disorder :  . Evaluation of current treatment plans and patient's adherence to plan as established by provider.  01-30-2020: The patient had to change her number and the number has been updated in the system. Today was not a good day for the patient but she is okay. She was supposed to have an appointment with the psychiatrist however she could not get the virtual part to work correctly. She has rescheduled her appointment for Tuesday 02-04-2020.  She will go to [facility] to the psychiatrist office to be seen. Evaluation of transportation and she states her daughter is going to take her.  She states that she and her daughter are now talking again. The best number to reach the patient on is [phone].  The [phone] is also  a working number but it cuts off frequently so the patient verbalized the best number to call is [phone]. [name] Assessed patient understanding of disease states: 01-30-2020: The patient states her blood sugars are doing good and states the range is 180 to 200.  She feels like she is doing better at taking care of herself but states she still has some "bad habits" and she hopes the psychiatrist can help her out. She is happy that her daughter is helping her again with transportation and talking to her. Her son has not talked to her in 2 months and this is a little upsetting to her.  . Assessed patient's education and care coordination needs: Working with the LCSW and Pharmacist.  The patient has her medications in a pill container and knows when to take her medications. She verbalized she has multiple medications that she takes but she is taking as prescribed. She is taking her iron supplements along with other Vitamins. 01-30-2020: The patient denies any needs at this time. She states that she has restored her relationship with her daughter and she is happy about this. She also knows she has to change some of her "bad habits". States today is not a good day but she is okay.  . Provided disease specific education to the patient.  The patient is watching her diet and eating a heart Healthy/ADA diet.  01-30-2020: Her blood sugars are running 180-200  Explained the importance of taking her blood sugars BID and recording.  The patient verbalized she  needs to work on her eating habits. Sometimes she does not eat the right things and eats "junk and cookies".  Education provided on healthy snacks. The patient wrote down information on her book.  Education on fasting blood sugars <130 and post prandial <180.  Eduction provided on hyperglycemia and hypoglycemia and the patient will work on making sure she eats balanced meals. The patient verbalized understanding. The patient will keep a record of her blood sugars and blood  pressures.  Nash Dimmer with appropriate clinical care team members regarding patient needs.  01-30-2020: Ongoing support and education from the CCM team. The patient has changes in circumstances and conditions frequently. The patient is doing better with managing her care more independently but needs constant and persistent reinforcement.  The patient states that she is appreciative of the CCM team and calling to check on her and help meet her health and wellness goals.  . Assessed the for evaluation for psychiatric needs. 12-19-2019: The patient verbalized she has talked with her psychiatrist and she realized the "dating scam" was something she really messed up with. She hates that it has caused distance between her and her daughter. She is talking to her sister that lives in [place] daily.  She knows what she did was not wise and she is dealing with the consequences. She wants to show her family and others that she is accountable and she can take care of herself. She is appreciative of the help and support of the pcp and CCM team. 01-30-2020: The patient was unable to complete her appointment with the psychiatrist virtually because of not understanding the technology. She has an appointment for in person on 02-04-2020.  The patient states her daughter is going to drive her to the appointment. She is thankful they are talking again and she is assisting her with getting to her appointments. She was not having a good day but denies any suicidal ideation. She says she is still tending to do "bad habits" and she knows she has got to stop. She feels working with the psychiatrist will be very beneficial for her. Education on calling the [facility] or other resources for help and assistance. The patient wrote down the [facility] number again so she would have it available.    . Assessed current living arrangements and safety concerns. : 08-22-2019: The patient is now living in her own apartment that is near her daughter. This  is the first time she has ever lived alone. She feels she is adjusting well. The patient sounded very positive and states that home health is working with her and also PT.  Spoke briefly to the patients daughter [name] and she feels this is a positive thing for her mom. 01-30-2020: The patient remains at the senior apartments. She is doing well at this time. She is figuring out how to independently care for herself and make arrangements for the things she needs. She did ask for assistance with resetting her password for myChart. Assistance given and the patient wants to be able to look at notes and see her appointments. . Assessed depression and social isolation needs.  The patient feels safe in her new apartment.  She is working on making friends in the apartment complex and she also is "adjusting well".  She was a little sad when discussing her mother.  Her mother is now in a memory care facility in [place] close to where her brother lives. She gets to talk to her 2 times per week on  the phone. This is a huge adjustment but she knows it is for the best. She says her daughter will take her for a visit soon. She is decorating her apartment and will have all her furniture next week. She is enjoying taking a shower and feels she is doing a good job at taking care of herself. 01-30-2020: The patient was not having a good day today, but states she is making progress with rescheduling appointments, taking her medications as directed, and arranging transporation. Working with resources for the expressed needs she has. Knows the CCM team is here to help and support as needed. Praised the patient for positive changes and being more independent. The patient is thankful for the support of the CCM team.   Patient Self Care Activities related to HTN, DMII, and schizo affective disorder :  . Patient is unable to independently self-manage chronic health conditions  Please see past updates related to this goal by clicking  on the "Past Updates" button in the selected goal  dates.        Patient verbalizes understanding of instructions provided today.   Telephone follow up appointment with care management team member scheduled for: 02-20-2020 at 27 am  Gabriela Larsson RN, MSN, [facility] Mobile: [phone]

## 2020-01-30 NOTE — Chronic Care Management (AMB) (Signed)
  Care Management   Note  01/30/2020 Name: [name] MRN: [id number] DOB: [birth year]  [name] is a 54 y.o. year old female who is a primary care patient of [name], DO and is actively engaged with the care management team. I reached out to [name] by phone today to assist with re-scheduling a follow up visit with the Pharmacist  Follow up plan: Telephone appointment with care management team member scheduled for:02/24/2020  Noreene Larsson, [place], [place], [place] [address] Direct Dial: [phone] [email or web address] Website: [email or web address]

## 2020-01-30 NOTE — Telephone Encounter (Signed)
Pt has been r/s  

## 2020-01-30 NOTE — Telephone Encounter (Signed)
°  Chronic Care Management   Note  01/30/2020 Name: KINSLY HILD MRN: [id number] DOB: 1965/08/06  After missed calls the patient called the RNCM back. Telephone numbers updated and are correct in the system.   Follow up plan: Telephone follow up appointment with care management team member scheduled for: 02-20-2020 at 52 am  Noreene Larsson RN, MSN, [facility] Mobile: [phone]

## 2020-01-30 NOTE — Chronic Care Management (AMB) (Signed)
Chronic Care Management   Follow Up Note   01/30/2020 Name: [name] MRN: [id number] DOB: 12/11/1965  Referred by: Karamalegos, [name] J, DO Reason for referral : Chronic Care Management (RNCM Follow up for Chronic Disease Management and Care Coordination Needs)   [name] is a 53 y.o. year old female who is a primary care patient of Karamalegos, [name] J, DO. The CCM team was consulted for assistance with chronic disease management and care coordination needs.    Review of patient status, including review of consultants reports, relevant laboratory and other test results, and collaboration with appropriate care team members and the patient's provider was performed as part of comprehensive patient evaluation and provision of chronic care management services.    SDOH (Social Determinants of Health) assessments performed: Yes See Care Plan activities for detailed interventions related to SDOH)     Outpatient Encounter Medications as of 01/30/2020  Medication Sig Note  . acetaminophen (TYLENOL) 325 MG tablet Take 2 tablets (650 mg total) by mouth every 6 (six) hours as needed for mild pain.   . ARIPiprazole (ABILIFY) 10 MG tablet Take 10 mg by mouth at bedtime.    . aspirin EC 81 MG tablet Take 1 tablet (81 mg total) by mouth daily.   . atorvastatin (LIPITOR) 40 MG tablet Take 1 tablet (40 mg total) by mouth daily.   . Blood Glucose Monitoring Suppl (ONETOUCH VERIO) w/Device KIT Use to check blood sugar up to 3 x daily   . buPROPion (WELLBUTRIN XL) 300 MG 24 hr tablet Take 300 mg by mouth daily.   . colestipol (COLESTID) 1 g tablet TAKE (2) TABLETS BY MOUTH TWICE DAILY.   . Ferrous Sulfate 27 MG TABS Take 27 mg by mouth daily.    . furosemide (LASIX) 40 MG tablet Take 1 tablet (40 mg total) by mouth daily.   . gabapentin (NEURONTIN) 300 MG capsule Take 1 capsule (300 mg total) by mouth at bedtime.   . hydrochlorothiazide (HYDRODIURIL) 12.5 MG tablet Take 1  tablet (12.5 mg total) by mouth daily.   . insulin glargine (LANTUS) 100 UNIT/ML Solostar Pen Inject 24 Units into the skin daily. Each morning 12/06/2019: Patient states nurse gave her lantus this morning.  . Lancets (ONETOUCH ULTRASOFT) lancets Use to check blood sugar up to 3 x daily   . Loperamide HCl (IMODIUM PO) Take 1 capsule by mouth daily as needed (diarrhea). Patient reports that this is a liquid form.   . metFORMIN (GLUCOPHAGE) 1000 MG tablet Take 1,000 mg by mouth daily.    . metoprolol succinate (TOPROL-XL) 50 MG 24 hr tablet Take 1 tablet (50 mg total) by mouth daily. Take with or immediately following a meal.   . ondansetron (ZOFRAN) 4 MG tablet Take 1 tablet (4 mg total) by mouth every 6 (six) hours. (Patient taking differently: Take 4 mg by mouth every 8 (eight) hours as needed for nausea or vomiting. )   . ONETOUCH VERIO test strip Check blood sugar up to 5 x daily as needed   . pantoprazole (PROTONIX) 40 MG tablet Take 1 tablet (40 mg total) by mouth 2 (two) times daily before a meal.   . traZODone (DESYREL) 50 MG tablet TAKE 1 TABLET BY MOUTH AT BEDTIME AS NEEDED FOR SLEEP.   . vitamin B-12 1000 MCG tablet Take 1 tablet (1,000 mcg total) by mouth daily.    No facility-administered encounter medications on file as of 01/30/2020.     Objective:  BP   Readings from Last 3 Encounters:  12/06/19 130/70  11/28/19 105/67  11/25/19 (!) 145/90    Goals Addressed              This Visit's Progress   .  RNCM: Pt-"I am living on my own now" (pt-stated)        Current Barriers:  . Chronic Disease Managemant support, education, and care coordination needs related to HTN, DMII, and Schizo affective disorder . Limited social support system . Transportation barriers . Financial constraints   Clinical Goal(s) related to HTN, DMII, and Schizo affective disorder :  Over the next 120 days, patient will: . Work with the care management team to address educational, disease management,  and care coordination needs  . Begin or continue self health monitoring activities as directed today The patients daughter, [name] is concerned because the patient is not taking her medications as directed, not doing daily self care, and refusing things just like she did before hospitalization.   . Call provider office for new or worsened signs and symptoms New or worsened symptom related to HTN, DM and Schizoaffective disorder . Call care management team with questions or concerns . Verbalize basic understanding of patient centered plan of care established today   Interventions related to HTN, DMII, and Schizo affective disorder :  . Evaluation of current treatment plans and patient's adherence to plan as established by provider.  01-30-2020: The patient had to change her number and the number has been updated in the system. Today was not a good day for the patient but she is okay. She was supposed to have an appointment with the psychiatrist however she could not get the virtual part to work correctly. She has rescheduled her appointment for Tuesday 02-04-2020.  She will go to [place] to the psychiatrist office to be seen. Evaluation of transportation and she states her daughter is going to take her.  She states that she and her daughter are now talking again. The best number to reach the patient on is [phone].  The [phone] is also a working number but it cuts off frequently so the patient verbalized the best number to call is [phone]. . Assessed patient understanding of disease states: 01-30-2020: The patient states her blood sugars are doing good and states the range is 180 to 200.  She feels like she is doing better at taking care of herself but states she still has some "bad habits" and she hopes the psychiatrist can help her out. She is happy that her daughter is helping her again with transportation and talking to her. Her son has not talked to her in 2 months and this is a little  upsetting to her.  . Assessed patient's education and care coordination needs: Working with the LCSW and Pharmacist.  The patient has her medications in a pill container and knows when to take her medications. She verbalized she has multiple medications that she takes but she is taking as prescribed. She is taking her iron supplements along with other Vitamins. 01-30-2020: The patient denies any needs at this time. She states that she has restored her relationship with her daughter and she is happy about this. She also knows she has to change some of her "bad habits". States today is not a good day but she is okay.  . Provided disease specific education to the patient.  The patient is watching her diet and eating a heart Healthy/ADA diet.  01-30-2020: Her blood sugars are running 180-200    Explained the importance of taking her blood sugars BID and recording.  The patient verbalized she needs to work on her eating habits. Sometimes she does not eat the right things and eats "junk and cookies".  Education provided on healthy snacks. The patient wrote down information on her book.  Education on fasting blood sugars <130 and post prandial <180.  Eduction provided on hyperglycemia and hypoglycemia and the patient will work on making sure she eats balanced meals. The patient verbalized understanding. The patient will keep a record of her blood sugars and blood pressures.  Nash Dimmer with appropriate clinical care team members regarding patient needs.  01-30-2020: Ongoing support and education from the CCM team. The patient has changes in circumstances and conditions frequently. The patient is doing better with managing her care more independently but needs constant and persistent reinforcement.  The patient states that she is appreciative of the CCM team and calling to check on her and help meet her health and wellness goals.  . Assessed the for evaluation for psychiatric needs. 12-19-2019: The patient verbalized she  has talked with her psychiatrist and she realized the "dating scam" was something she really messed up with. She hates that it has caused distance between her and her daughter. She is talking to her sister that lives in [place] daily.  She knows what she did was not wise and she is dealing with the consequences. She wants to show her family and others that she is accountable and she can take care of herself. She is appreciative of the help and support of the pcp and CCM team. 01-30-2020: The patient was unable to complete her appointment with the psychiatrist virtually because of not understanding the technology. She has an appointment for in person on 02-04-2020.  The patient states her daughter is going to drive her to the appointment. She is thankful they are talking again and she is assisting her with getting to her appointments. She was not having a good day but denies any suicidal ideation. She says she is still tending to do "bad habits" and she knows she has got to stop. She feels working with the psychiatrist will be very beneficial for her. Education on calling the [facility] or other resources for help and assistance. The patient wrote down the [facility] number again so she would have it available.    . Assessed current living arrangements and safety concerns. : 08-22-2019: The patient is now living in her own apartment that is near her daughter. This is the first time she has ever lived alone. She feels she is adjusting well. The patient sounded very positive and states that home health is working with her and also PT.  Spoke briefly to the patients daughter [name] and she feels this is a positive thing for her mom. 01-30-2020: The patient remains at the senior apartments. She is doing well at this time. She is figuring out how to independently care for herself and make arrangements for the things she needs. She did ask for assistance with resetting her password for myChart. Assistance given and the patient  wants to be able to look at notes and see her appointments. . Assessed depression and social isolation needs.  The patient feels safe in her new apartment.  She is working on making friends in the apartment complex and she also is "adjusting well".  She was a little sad when discussing her mother.  Her mother is now in a memory care facility in [place] close  to where her brother lives. She gets to talk to her 2 times per week on the phone. This is a huge adjustment but she knows it is for the best. She says her daughter will take her for a visit soon. She is decorating her apartment and will have all her furniture next week. She is enjoying taking a shower and feels she is doing a good job at taking care of herself. 01-30-2020: The patient was not having a good day today, but states she is making progress with rescheduling appointments, taking her medications as directed, and arranging transporation. Working with resources for the expressed needs she has. Knows the CCM team is here to help and support as needed. Praised the patient for positive changes and being more independent. The patient is thankful for the support of the CCM team.   Patient Self Care Activities related to HTN, DMII, and schizo affective disorder :  . Patient is unable to independently self-manage chronic health conditions  Please see past updates related to this goal by clicking on the "Past Updates" button in the selected goal  dates.         Plan:   Telephone follow up appointment with care management team member scheduled for: 02-20-2020 at 1030 am   Pam Tate RN, MSN, CCM Community Care Coordinator [place]  [facility] Mobile: [phone]  

## 2020-01-31 ENCOUNTER — Telehealth: Payer: Self-pay | Admitting: *Deleted

## 2020-01-31 NOTE — Chronic Care Management (AMB) (Signed)
  Care Management   Note  01/31/2020 Name: CLAUDETT BAYLY MRN: [id number] DOB: [birth year]  BEXLEY LAUBACH is a 54 y.o. year old female who is a primary care patient of [name], DO and is actively engaged with the care management team. I reached out to [name] by phone today to assist with re-scheduling a follow up visit with the Licensed Clinical Education officer, museum.  Follow up plan: The care management team will reach out to the patient again over the next 7 days.  If patient returns call to provider office, please advise to call [place] at [phone].  [place] Management

## 2020-02-02 DIAGNOSIS — M14672 Charcot's joint, left ankle and foot: Secondary | ICD-10-CM | POA: Diagnosis not present

## 2020-02-04 ENCOUNTER — Telehealth: Payer: Self-pay

## 2020-02-07 NOTE — Chronic Care Management (AMB) (Signed)
  Care Management   Note  02/07/2020 Name: [name] MRN: [id number] DOB: 04/19/1966  [name] is a 54 y.o. year old female who is a primary care patient of [name], DO and is actively engaged with the care management team. I reached out to [name] by phone today to assist with re-scheduling a follow up visit with the Licensed Clinical Social Worker  Follow up plan: Unsuccessful telephone outreach attempt made. The care management team will reach out to the patient again over the next 7 days. If patient returns call to provider office, please advise to call [place] at [phone].  [place] Management

## 2020-02-13 ENCOUNTER — Telehealth: Payer: Self-pay

## 2020-02-18 ENCOUNTER — Other Ambulatory Visit: Payer: Self-pay | Admitting: Family Medicine

## 2020-02-18 ENCOUNTER — Encounter (HOSPITAL_COMMUNITY): Payer: Self-pay | Admitting: *Deleted

## 2020-02-18 ENCOUNTER — Other Ambulatory Visit: Payer: Self-pay

## 2020-02-18 ENCOUNTER — Emergency Department (HOSPITAL_COMMUNITY)
Admission: EM | Admit: 2020-02-18 | Discharge: 2020-02-20 | Disposition: A | Discharge status: Other Acute Inpatient Hospital | Payer: Medicare Other | Attending: Emergency Medicine | Admitting: Emergency Medicine

## 2020-02-18 DIAGNOSIS — R197 Diarrhea, unspecified: Secondary | ICD-10-CM

## 2020-02-18 DIAGNOSIS — E1142 Type 2 diabetes mellitus with diabetic polyneuropathy: Secondary | ICD-10-CM | POA: Diagnosis not present

## 2020-02-18 DIAGNOSIS — K909 Intestinal malabsorption, unspecified: Secondary | ICD-10-CM

## 2020-02-18 DIAGNOSIS — Z743 Need for continuous supervision: Secondary | ICD-10-CM | POA: Diagnosis not present

## 2020-02-18 DIAGNOSIS — E1165 Type 2 diabetes mellitus with hyperglycemia: Secondary | ICD-10-CM | POA: Diagnosis not present

## 2020-02-18 DIAGNOSIS — Z79899 Other long term (current) drug therapy: Secondary | ICD-10-CM | POA: Insufficient documentation

## 2020-02-18 DIAGNOSIS — Z87891 Personal history of nicotine dependence: Secondary | ICD-10-CM | POA: Diagnosis not present

## 2020-02-18 DIAGNOSIS — Z7982 Long term (current) use of aspirin: Secondary | ICD-10-CM | POA: Diagnosis not present

## 2020-02-18 DIAGNOSIS — R6889 Other general symptoms and signs: Secondary | ICD-10-CM | POA: Diagnosis not present

## 2020-02-18 DIAGNOSIS — Z794 Long term (current) use of insulin: Secondary | ICD-10-CM | POA: Diagnosis not present

## 2020-02-18 DIAGNOSIS — Z85038 Personal history of other malignant neoplasm of large intestine: Secondary | ICD-10-CM | POA: Insufficient documentation

## 2020-02-18 DIAGNOSIS — F259 Schizoaffective disorder, unspecified: Secondary | ICD-10-CM | POA: Diagnosis not present

## 2020-02-18 DIAGNOSIS — R5381 Other malaise: Secondary | ICD-10-CM | POA: Diagnosis not present

## 2020-02-18 DIAGNOSIS — Z7984 Long term (current) use of oral hypoglycemic drugs: Secondary | ICD-10-CM | POA: Diagnosis not present

## 2020-02-18 DIAGNOSIS — I10 Essential (primary) hypertension: Secondary | ICD-10-CM | POA: Insufficient documentation

## 2020-02-18 DIAGNOSIS — F251 Schizoaffective disorder, depressive type: Secondary | ICD-10-CM

## 2020-02-18 DIAGNOSIS — Z9049 Acquired absence of other specified parts of digestive tract: Secondary | ICD-10-CM

## 2020-02-18 DIAGNOSIS — F329 Major depressive disorder, single episode, unspecified: Secondary | ICD-10-CM | POA: Diagnosis present

## 2020-02-18 LAB — RAPID URINE DRUG SCREEN, HOSP PERFORMED
Amphetamines: NOT DETECTED
Barbiturates: NOT DETECTED
Benzodiazepines: NOT DETECTED
Cocaine: NOT DETECTED
Opiates: NOT DETECTED
Tetrahydrocannabinol: NOT DETECTED

## 2020-02-18 LAB — CBC WITH DIFFERENTIAL/PLATELET
Abs Immature Granulocytes: 0.03 10*3/uL (ref 0.00–0.07)
Basophils Absolute: 0.1 10*3/uL (ref 0.0–0.1)
Basophils Relative: 1 %
Eosinophils Absolute: 0.2 10*3/uL (ref 0.0–0.5)
Eosinophils Relative: 3 %
HCT: 33.6 % — ABNORMAL LOW (ref 36.0–46.0)
Hemoglobin: 10.7 g/dL — ABNORMAL LOW (ref 12.0–15.0)
Immature Granulocytes: 0 %
Lymphocytes Relative: 33 %
Lymphs Abs: 3.1 10*3/uL (ref 0.7–4.0)
MCH: 28.8 pg (ref 26.0–34.0)
MCHC: 31.8 g/dL (ref 30.0–36.0)
MCV: 90.6 fL (ref 80.0–100.0)
Monocytes Absolute: 0.5 10*3/uL (ref 0.1–1.0)
Monocytes Relative: 5 %
Neutro Abs: 5.4 10*3/uL (ref 1.7–7.7)
Neutrophils Relative %: 58 %
Platelets: 291 10*3/uL (ref 150–400)
RBC: 3.71 MIL/uL — ABNORMAL LOW (ref 3.87–5.11)
RDW: 14.6 % (ref 11.5–15.5)
WBC: 9.4 10*3/uL (ref 4.0–10.5)
nRBC: 0 % (ref 0.0–0.2)

## 2020-02-18 LAB — BASIC METABOLIC PANEL
Anion gap: 9 (ref 5–15)
BUN: 14 mg/dL (ref 6–20)
CO2: 25 mmol/L (ref 22–32)
Calcium: 9.5 mg/dL (ref 8.9–10.3)
Chloride: 101 mmol/L (ref 98–111)
Creatinine, Ser: 1.31 mg/dL — ABNORMAL HIGH (ref 0.44–1.00)
GFR, Estimated: 45 mL/min — ABNORMAL LOW (ref >60)
Glucose, Bld: 162 mg/dL — ABNORMAL HIGH (ref 70–99)
Potassium: 4.3 mmol/L (ref 3.5–5.1)
Sodium: 135 mmol/L (ref 135–145)

## 2020-02-18 LAB — ETHANOL: Alcohol, Ethyl (B): <10 mg/dL (ref <10)

## 2020-02-18 NOTE — Telephone Encounter (Signed)
Requested medication (s) are due for refill today: yes  Requested medication (s) are on the active medication list: yes  Last refill:  01/20/20  Future visit scheduled: no  Notes to clinic:  no refill protocol for this med   Requested Prescriptions  Pending Prescriptions Disp Refills   colestipol (COLESTID) 1 g tablet [Pharmacy Med Name: COLESTIPOL HCL 1 GM TABLET] 120 tablet 0    Sig: TAKE (2) TABLETS BY MOUTH TWICE DAILY.      There is no refill protocol information for this order

## 2020-02-18 NOTE — Chronic Care Management (AMB) (Signed)
  Care Management   Note  02/18/2020 Name: [name] MRN: [id number] DOB: 1965/10/06  [name] is a 54 y.o. year old female who is a primary care patient of [name], DO and is actively engaged with the care management team. I reached out to [name] by phone today to assist with re-scheduling a follow up visit with the Licensed Clinical Education officer, museum.  Follow up plan: Unsuccessful telephone outreach attempt made. Unable to make contact on outreach attempts x 3. PCP [name], DO notified via routed documentation in medical record. We have been unable to make contact with the patient for follow up. The care management team is available to follow up with the patient after provider conversation with the patient regarding recommendation for care management engagement and subsequent re-referral to the care management team. If patient returns call to provider office, please advise to call Embedded Care Management Care Guide  at [place]: [phone]

## 2020-02-18 NOTE — Telephone Encounter (Signed)
Requested medication (s) are due for refill today: -  Requested medication (s) are on the active medication list: no  Last refill:  Abilify:02/24/16   bupropion: 07/24/19  Future visit scheduled: Notes to clinic: Historical med and provider   Requested Prescriptions  Pending Prescriptions Disp Refills   buPROPion (WELLBUTRIN XL) 300 MG 24 hr tablet [Pharmacy Med Name: BUPROPION HCL XL 300 MG TABLET] 30 tablet 0    Sig: TAKE 1 TABLET BY MOUTH EACH MORNING.      There is no refill protocol information for this order      ARIPiprazole (ABILIFY) 10 MG tablet [Pharmacy Med Name: ARIPIPRAZOLE 10 MG TABLET] 30 tablet 0    Sig: TAKE 1 TABLET BY MOUTH AT BEDTIME.      There is no refill protocol information for this order

## 2020-02-18 NOTE — ED Triage Notes (Signed)
Pt states she had not been taking care of herself and not taking her medications as prescribed.  Pt denies SI or HI.  Pt admits to a lot of stressors in her life.  Pt here voluntarily.

## 2020-02-19 ENCOUNTER — Ambulatory Visit: Payer: Medicare Other | Admitting: Family Medicine

## 2020-02-19 LAB — CBG MONITORING, ED
Glucose-Capillary: 165 mg/dL — ABNORMAL HIGH (ref 70–99)
Glucose-Capillary: 184 mg/dL — ABNORMAL HIGH (ref 70–99)

## 2020-02-19 MED ORDER — FERROUS SULFATE 325 (65 FE) MG PO TABS
325.0000 mg | ORAL_TABLET | Freq: Every day | ORAL | Status: DC
  Administered 2020-02-19 – 2020-02-20 (×2): 325 mg via ORAL
  Filled 2020-02-19 (×2): qty 1

## 2020-02-19 MED ORDER — ASPIRIN EC 81 MG PO TBEC
81.0000 mg | DELAYED_RELEASE_TABLET | Freq: Every day | ORAL | Status: DC
  Administered 2020-02-19 – 2020-02-20 (×2): 81 mg via ORAL
  Filled 2020-02-19 (×2): qty 1

## 2020-02-19 MED ORDER — ONDANSETRON HCL 4 MG PO TABS: 4.0000 mg | ORAL_TABLET | Freq: Three times a day (TID) | ORAL | Status: DC | PRN

## 2020-02-19 MED ORDER — BUPROPION HCL ER (XL) 150 MG PO TB24
300.0000 mg | ORAL_TABLET | Freq: Every day | ORAL | Status: DC
  Administered 2020-02-19 – 2020-02-20 (×2): 300 mg via ORAL
  Filled 2020-02-19 (×2): qty 2

## 2020-02-19 MED ORDER — ACETAMINOPHEN 325 MG PO TABS: 650.0000 mg | ORAL_TABLET | Freq: Four times a day (QID) | ORAL | Status: DC | PRN

## 2020-02-19 MED ORDER — COLESTIPOL HCL 1 G PO TABS
1.0000 g | ORAL_TABLET | Freq: Every day | ORAL | Status: DC
  Administered 2020-02-19 – 2020-02-20 (×2): 1 g via ORAL
  Filled 2020-02-19 (×3): qty 1

## 2020-02-19 MED ORDER — METFORMIN HCL 500 MG PO TABS
1000.0000 mg | ORAL_TABLET | Freq: Every day | ORAL | Status: DC
  Administered 2020-02-19 – 2020-02-20 (×2): 1000 mg via ORAL
  Filled 2020-02-19 (×2): qty 2

## 2020-02-19 MED ORDER — ATORVASTATIN CALCIUM 40 MG PO TABS
40.0000 mg | ORAL_TABLET | Freq: Every day | ORAL | Status: DC
  Administered 2020-02-19 – 2020-02-20 (×2): 40 mg via ORAL
  Filled 2020-02-19 (×2): qty 1

## 2020-02-19 MED ORDER — FUROSEMIDE 40 MG PO TABS
40.0000 mg | ORAL_TABLET | Freq: Every day | ORAL | Status: DC
  Administered 2020-02-19 – 2020-02-20 (×2): 40 mg via ORAL
  Filled 2020-02-19 (×2): qty 1

## 2020-02-19 MED ORDER — ARIPIPRAZOLE 5 MG PO TABS
10.0000 mg | ORAL_TABLET | Freq: Every day | ORAL | Status: DC
  Administered 2020-02-19: 10 mg via ORAL
  Filled 2020-02-19: qty 2

## 2020-02-19 MED ORDER — TRAZODONE HCL 50 MG PO TABS: 50.0000 mg | ORAL_TABLET | Freq: Every evening | ORAL | Status: DC | PRN

## 2020-02-19 MED ORDER — INSULIN GLARGINE 100 UNIT/ML ~~LOC~~ SOLN
24.0000 [IU] | Freq: Every day | SUBCUTANEOUS | Status: DC
  Administered 2020-02-19 – 2020-02-20 (×2): 24 [IU] via SUBCUTANEOUS
  Filled 2020-02-19 (×3): qty 0.24

## 2020-02-19 MED ORDER — HYDROCHLOROTHIAZIDE 25 MG PO TABS
12.5000 mg | ORAL_TABLET | Freq: Every day | ORAL | Status: DC
  Administered 2020-02-19 – 2020-02-20 (×2): 12.5 mg via ORAL
  Filled 2020-02-19 (×2): qty 1

## 2020-02-19 MED ORDER — METOPROLOL SUCCINATE ER 25 MG PO TB24
50.0000 mg | ORAL_TABLET | Freq: Every day | ORAL | Status: DC
  Administered 2020-02-19 – 2020-02-20 (×2): 50 mg via ORAL
  Filled 2020-02-19 (×2): qty 2

## 2020-02-19 MED ORDER — PANTOPRAZOLE SODIUM 40 MG PO TBEC
40.0000 mg | DELAYED_RELEASE_TABLET | Freq: Two times a day (BID) | ORAL | Status: DC
  Administered 2020-02-19 – 2020-02-20 (×3): 40 mg via ORAL
  Filled 2020-02-19 (×3): qty 1

## 2020-02-19 MED ORDER — GABAPENTIN 300 MG PO CAPS
300.0000 mg | ORAL_CAPSULE | Freq: Every day | ORAL | Status: DC
  Administered 2020-02-19: 300 mg via ORAL
  Filled 2020-02-19: qty 1

## 2020-02-19 NOTE — ED Notes (Signed)
Pt changed into psych scrubs. Pt wanded by security after changing into psych scrubs.

## 2020-02-19 NOTE — ED Notes (Signed)
Pt tearful on the phone with her daughter. Pt states she doesn't understand the process or why she was told one thing and now things are different. Informed pt I had contacted [facility] to explain to her what has changed.

## 2020-02-19 NOTE — BH Assessment (Addendum)
Comprehensive Clinical Assessment (CCA) Screening, Triage and Referral Note  02/19/2020 [name] [id number]   [name] is a 54 year old female presenting voluntarily to the ED at family's request due to not taking caring of herself. Patient denied SI, HI, alcohol/drug usage and psychosis. When asked, why are you here, patient stated "my family wanted me to come here, they said I am not doing some things". Clinician asked for collateral contact, patient stated " I don't have any collateral contact because they don't like my lifestyle and while I am doing they don't want me around, they disowned me". Patient explained lifestyle "I date men on the internet, I have some of them in person, they don't like that". Patient denied prior psych inpatient treatment, suicide attempts and self-harming behaviors. Patient reported normal sleep and appetite "I eat when I get hungry". Patient was calm and cooperative during assessment.  Patient resides alone. Patient is currently seeing [facility] for medication management. Patient reported taking medications regularly. Patient is currently receiving disability. Patient reported no access to guns.   Per ED Provider Wwhen asked why she is here tonight she states "I have had issues". She states her family feels like she is not taking her medication.  She states she gets her medication in a "bubble wrap" and she has been taking it for the past 2 weeks but she is not sure about before that.  She states she does take her insulin every day.   Disposition Lindon Romp, NP, psych cleared. Patient will follow up with [facility] for medication management and additional outpatient mental health resources.   At the time the initial disposition was made, TTS was not aware that the patient had been petitioned for IVC. TTS will need to collect collateral prior to final disposition.  Visit Diagnosis: Major depressive disorder  Patient Reported Information How did you  hear about [place]? Self   Referral name: No data recorded  Referral phone number: No data recorded Whom do you see for routine medical problems? Primary Care   Practice/Facility Name: No data recorded  Practice/Facility Phone Number: No data recorded  Name of Contact: No data recorded  Contact Number: No data recorded  Contact Fax Number: No data recorded  Prescriber Name: No data recorded  Prescriber Address (if known): No data recorded What Is the Reason for Your Visit/Call Today? No data recorded How Long Has This Been Causing You Problems? 1 wk - 1 month  Have You Recently Been in Any Inpatient Treatment (Hospital/Detox/Crisis Center/28-Day Program)? No   Name/Location of Program/Hospital:No data recorded  How Long Were You There? No data recorded  When Were You Discharged? No data recorded Have You Ever Received Services From [facility] Before? No   Who Do You See at [facility]? No data recorded Have You Recently Had Any Thoughts About Hurting Yourself? No   Are You Planning to Commit Suicide/Harm Yourself At This time?  No  Have you Recently Had Thoughts About [place]? No   Explanation: No data recorded Have You Used Any Alcohol or Drugs in the Past 24 Hours? No   How Long Ago Did You Use Drugs or Alcohol?  No data recorded  What Did You Use and How Much? No data recorded What Do You Feel Would Help You the Most Today? Therapy;Medication  Do You Currently Have a Therapist/Psychiatrist? No   Name of Therapist/Psychiatrist: No data recorded  Have You Been Recently Discharged From Any Office Practice or  Programs? No   Explanation of Discharge From Practice/Program:  No data recorded    CCA Screening Triage Referral Assessment Type of Contact: Tele-Assessment   Is this Initial or Reassessment? Initial Assessment   Date Telepsych consult ordered in [facility]:  02/19/20   Time Telepsych consult ordered in [facility]:  0242  Patient Reported Information Reviewed?  Yes   Patient Left Without Being Seen? No data recorded  Reason for Not Completing Assessment: No data recorded Collateral Involvement: none reported  Does Patient Have a [place]? No data recorded  Name and Contact of Legal Guardian:  Self.   If Minor and Not Living with Parent(s), Who has Custody? No data recorded Is CPS involved or ever been involved? Never  Is APS involved or ever been involved? Never  Patient Determined To Be At Risk for Harm To Self or Others Based on Review of Patient Reported Information or Presenting Complaint? No   Method: No data recorded  Availability of Means: No data recorded  Intent: No data recorded  Notification Required: No data recorded  Additional Information for Danger to Others Potential:  No data recorded  Additional Comments for Danger to Others Potential:  No data recorded  Are There Guns or Other Weapons in Your Home?  No data recorded   Types of Guns/Weapons: No data recorded   Are These Weapons Safely Secured?                              No data recorded   Who Could Verify You Are Able To Have These Secured:    No data recorded Do You Have any Outstanding Charges, Pending Court Dates, Parole/Probation? No data recorded Contacted To Inform of Risk of Harm To Self or Others: No data recorded Location of Assessment: [facility]  Does Patient Present under Involuntary Commitment? No   IVC Papers Initial File Date: No data recorded  [place] of Residence: [place]  Patient Currently Receiving the Following Services: Medication Management   Determination of Need: Emergent (2 hours)   Options For Referral: Outpatient Therapy   Venora Maples, [facility]

## 2020-02-19 NOTE — ED Provider Notes (Signed)
[facility] EMERGENCY DEPARTMENT Provider Note   CSN: [id number] Arrival date & time: 02/18/20  1953   Time seen 1:35 AM  History Chief Complaint  Patient presents with  . V70.1  . Depression    [name] is a 54 y.o. female.  HPI   Patient states she has a history of diabetes and she is wearing a boot on her left leg because she has Charcot's joint and she is also wheelchair-bound.  She is followed by Dr. Luana Shu.  When asked why she is here tonight she states "I have had issues".  She states her family feels like she is not taking her medication.  She states she gets her medication in a "bubble wrap" and she has been taking it for the past 2 weeks but she is not sure about before that.  She states she does take her insulin every day.  She states she has been talking to men all night long on the Internet which her family do not want her to do.  She has been sending them money and has even had 2 of them come stay a few days at her house.  She states "I can do what I want to do with my own money".  When asked if she is depressed she states she "could be".  She denies suicidal or homicidal ideation.  She also states she has not been able to sleep well for about 8 to 12 months.  She states "my whole life people have been against what I am doing".  PCP [name], DO   Past Medical History:  Diagnosis Date  . Charcot's joint of foot, left   . Depression   . Diabetes mellitus without complication ([place])   . Hyperlipidemia   . Hypertension   . Neuromuscular disorder ([place])   . Schizo affective schizophrenia ([place])    abilify and pazil    Patient Active Problem List   Diagnosis Date Noted  . Chronic diarrhea 11/25/2019  . History of colon cancer 11/25/2019  . Heart palpitations 08/19/2019  . Iron deficiency anemia 07/25/2019  . Diabetes mellitus with polyneuropathy ([place]) 07/17/2019  . S/P partial colectomy 07/17/2019  . Acute kidney injury ([place]) 07/17/2019  .  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[place]) 05/28/2019  . Anemia   . Hyponatremia 05/23/2019  . Neuropathic foot ulcer, left, with fat layer exposed ([place]) 02/05/2019  . Hyperkalemia 02/05/2019  . Persistent microalbuminuria associated with type 2 diabetes mellitus ([place]) 05/16/2018  . Type 2 diabetes mellitus with hyperglycemia, with long-term current use of insulin ([place]) 05/16/2018  . Morbid obesity ([place]) 05/10/2017  . Long-term use of high-risk medication 05/10/2017  . Hyperlipidemia associated with type 2 diabetes mellitus ([place]) 06/27/2016  . Hammer toe of second toe of right foot 04/20/2016  . Essential hypertension 06/29/2015  . Schizoaffective disorder ([place]) 06/29/2015  . DM (diabetes mellitus), type 2, 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[place]) 58/59/2924  . Polyneuropathy 04/02/2014    Past Surgical History:  Procedure Laterality Date  . CHOLECYSTECTOMY    . COLONOSCOPY WITH PROPOFOL N/A 05/26/2019   Procedure: COLONOSCOPY WITH PROPOFOL;  Surgeon: Toledo, Benay Pike, MD;  Location: [facility] ENDOSCOPY;  Service: Gastroenterology;  Laterality: N/A;  . ESOPHAGOGASTRODUODENOSCOPY (EGD) WITH PROPOFOL N/A 05/26/2019   Procedure: ESOPHAGOGASTRODUODENOSCOPY (EGD) WITH PROPOFOL;  Surgeon: Toledo, Benay Pike, MD;  Location: [facility] ENDOSCOPY;  Service: Gastroenterology;  Laterality: N/A;  . IRRIGATION AND DEBRIDEMENT FOOT Right 02/26/2016   Procedure: RIGHT 2ND TOE DEBRIDEMENT;  Surgeon: Samara Deist,  DPM;  Location: [facility] ORS;  Service: Podiatry;  Laterality: Right;  . IRRIGATION AND DEBRIDEMENT FOOT Left 12/13/2018   Procedure: IRRIGATION AND DEBRIDEMENT FOOT;  Surgeon: Caroline More, DPM;  Location: [facility] ORS;  Service: Podiatry;  Laterality: Left;     OB History    Gravida  2   Para  2   Term  2   Preterm      AB      Living  2     SAB      TAB      Ectopic      Multiple      Live Births              Family History  Problem Relation Age of Onset  . Breast cancer Cousin         maternal cousin  . Hypertension Mother   . Hyperthyroidism Mother   . Dementia Mother   . Prostate cancer Father   . Diabetes Maternal Grandmother   . Hypertension Maternal Grandmother   . Cancer Maternal Grandmother        unknown type  . Diabetes Maternal Grandfather   . Hypertension Maternal Grandfather   . Diabetes Paternal Grandmother   . Hypertension Paternal Grandmother   . Diabetes Paternal Grandfather   . Hypertension Paternal Grandfather     Social History   Tobacco Use  . Smoking status: Former Smoker    Packs/day: 3.00    Years: 30.00    Pack years: 90.00    Types: Cigarettes    Quit date: 05/02/2009    Years since quitting: 10.8  . Smokeless tobacco: Never Used  . Tobacco comment: Pt reported  quitting in 2011  Vaping Use  . Vaping Use: Never used  Substance Use Topics  . Alcohol use: No  . Drug use: No  On disability for mental issues about 10 years  Home Medications Prior to Admission medications   Medication Sig Start Date End Date Taking? Authorizing Provider  acetaminophen (TYLENOL) 325 MG tablet Take 2 tablets (650 mg total) by mouth every 6 (six) hours as needed for mild pain. 06/04/19   Nicole Kindred A, DO  ARIPiprazole (ABILIFY) 10 MG tablet Take 10 mg by mouth at bedtime.     [provider]  aspirin EC 81 MG tablet Take 1 tablet (81 mg total) by mouth daily. 12/18/19   Karamalegos, Devonne Doughty, DO  atorvastatin (LIPITOR) 40 MG tablet Take 1 tablet (40 mg total) by mouth daily. 12/18/19   Karamalegos, Devonne Doughty, DO  Blood Glucose Monitoring Suppl (ONETOUCH VERIO) w/Device KIT Use to check blood sugar up to 3 x daily 09/11/19   Parks Ranger, Devonne Doughty, DO  buPROPion (WELLBUTRIN XL) 300 MG 24 hr tablet Take 300 mg by mouth daily. 07/23/19   [provider]  colestipol (COLESTID) 1 g tablet TAKE (2) TABLETS BY MOUTH TWICE DAILY. 01/20/20   Karamalegos, Devonne Doughty, DO  Ferrous Sulfate 27 MG TABS Take 27 mg by mouth daily.      [provider]  furosemide (LASIX) 40 MG tablet Take 1 tablet (40 mg total) by mouth daily. 12/18/19   Karamalegos, Devonne Doughty, DO  gabapentin (NEURONTIN) 300 MG capsule Take 1 capsule (300 mg total) by mouth at bedtime. 12/18/19   Karamalegos, Devonne Doughty, DO  hydrochlorothiazide (HYDRODIURIL) 12.5 MG tablet Take 1 tablet (12.5 mg total) by mouth daily. 12/18/19   Karamalegos, Devonne Doughty, DO  insulin glargine (LANTUS) 100 UNIT/ML Solostar Pen  Inject 24 Units into the skin daily. Each morning 10/02/19   [provider]  Lancets Glory Rosebush ULTRASOFT) lancets Use to check blood sugar up to 3 x daily 03/18/19   [name], DO  Loperamide HCl (IMODIUM PO) Take 1 capsule by mouth daily as needed (diarrhea). Patient reports that this is a liquid form.    [provider]  metFORMIN (GLUCOPHAGE) 1000 MG tablet Take 1,000 mg by mouth daily.  10/02/19   [provider]  metoprolol succinate (TOPROL-XL) 50 MG 24 hr tablet Take 1 tablet (50 mg total) by mouth daily. Take with or immediately following a meal. 12/18/19   Karamalegos, Devonne Doughty, DO  ondansetron (ZOFRAN) 4 MG tablet Take 1 tablet (4 mg total) by mouth every 6 (six) hours. Patient taking differently: Take 4 mg by mouth every 8 (eight) hours as needed for nausea or vomiting.  05/16/19   Wurst, [place], PA-C  ONETOUCH VERIO test strip Check blood sugar up to 5 x daily as needed 04/17/19   [name], DO  pantoprazole (PROTONIX) 40 MG tablet Take 1 tablet (40 mg total) by mouth 2 (two) times daily before a meal. 12/18/19   Karamalegos, Devonne Doughty, DO  traZODone (DESYREL) 50 MG tablet TAKE 1 TABLET BY MOUTH AT BEDTIME AS NEEDED FOR SLEEP. 01/21/20   Karamalegos, Devonne Doughty, DO  vitamin B-12 1000 MCG tablet Take 1 tablet (1,000 mcg total) by mouth daily. 06/05/19   Ezekiel Slocumb, DO    Allergies    Patient has no known allergies.  Review of Systems   Review of Systems  All other systems  reviewed and are negative.   Physical Exam Updated Vital Signs BP (!) 141/124 (BP Location: Right Arm)   Pulse (!) 112   Temp 98.1 F (36.7 C) (Oral)   Resp 20   Ht '5\' 10"'  (1.778 m)   Wt 106.6 kg   SpO2 100%   BMI 33.72 kg/m   Physical Exam Vitals and nursing note reviewed.  Constitutional:      Appearance: Normal appearance. She is obese.  HENT:     Head: Normocephalic and atraumatic.     Right Ear: External ear normal.     Left Ear: External ear normal.  Eyes:     Extraocular Movements: Extraocular movements intact.     Pupils: Pupils are equal, round, and reactive to light.  Cardiovascular:     Rate and Rhythm: Normal rate and regular rhythm.     Pulses: Normal pulses.     Heart sounds: No murmur heard.   Pulmonary:     Effort: Pulmonary effort is normal. No respiratory distress.     Breath sounds: Normal breath sounds.  Musculoskeletal:        General: Normal range of motion.     Cervical back: Normal range of motion.     Comments: Patient has a boot on her left leg that extends almost to the knee  Skin:    General: Skin is warm and dry.  Neurological:     General: No focal deficit present.     Mental Status: She is alert and oriented to person, place, and time.     Cranial Nerves: No cranial nerve deficit.  Psychiatric:        Mood and Affect: Mood normal.        Speech: Speech normal.        Behavior: Behavior normal.        Thought Content: Thought  content does not include homicidal or suicidal ideation.     ED Results / Procedures / Treatments   Labs (all labs ordered are listed, but only abnormal results are displayed) Results for orders placed or performed during the hospital encounter of 02/18/20  CBC with Differential  Result Value Ref Range   WBC 9.4 4.0 - 10.5 K/uL   RBC 3.71 (L) 3.87 - 5.11 MIL/uL   Hemoglobin 10.7 (L) 12.0 - 15.0 g/dL   HCT 33.6 (L) 36 - 46 %   MCV 90.6 80.0 - 100.0 fL   MCH 28.8 26.0 - 34.0 pg   MCHC 31.8 30.0 - 36.0  g/dL   RDW 14.6 11.5 - 15.5 %   Platelets 291 150 - 400 K/uL   nRBC 0.0 0.0 - 0.2 %   Neutrophils Relative % 58 %   Neutro Abs 5.4 1.7 - 7.7 K/uL   Lymphocytes Relative 33 %   Lymphs Abs 3.1 0.7 - 4.0 K/uL   Monocytes Relative 5 %   Monocytes Absolute 0.5 0.1 - 1.0 K/uL   Eosinophils Relative 3 %   Eosinophils Absolute 0.2 0.0 - 0.5 K/uL   Basophils Relative 1 %   Basophils Absolute 0.1 0.0 - 0.1 K/uL   Immature Granulocytes 0 %   Abs Immature Granulocytes 0.03 0.00 - 0.07 K/uL  Basic metabolic panel  Result Value Ref Range   Sodium 135 135 - 145 mmol/L   Potassium 4.3 3.5 - 5.1 mmol/L   Chloride 101 98 - 111 mmol/L   CO2 25 22 - 32 mmol/L   Glucose, Bld 162 (H) 70 - 99 mg/dL   BUN 14 6 - 20 mg/dL   Creatinine, Ser 1.31 (H) 0.44 - 1.00 mg/dL   Calcium 9.5 8.9 - 10.3 mg/dL   GFR, Estimated 45 (L) >60 mL/min   Anion gap 9 5 - 15  Rapid urine drug screen (hospital performed)  Result Value Ref Range   Opiates NONE DETECTED NONE DETECTED   Cocaine NONE DETECTED NONE DETECTED   Benzodiazepines NONE DETECTED NONE DETECTED   Amphetamines NONE DETECTED NONE DETECTED   Tetrahydrocannabinol NONE DETECTED NONE DETECTED   Barbiturates NONE DETECTED NONE DETECTED  Ethanol  Result Value Ref Range   Alcohol, Ethyl (B) <10 <10 mg/dL   Laboratory interpretation all normal except hyperglycemia, mild anemia   EKG None  Radiology No results found.  Procedures Procedures (including critical care time)      Medications Ordered in ED Medications - No data to display  ED Course  I have reviewed the triage vital signs and the nursing notes.  Pertinent labs & imaging results that were available during my care of the patient were reviewed by me and considered in my medical decision making (see chart for details).  Patient had TTS consult.  Their note states patient could be medically cleared.  I rescinded her IVC papers.  However after I rescinded the IVC papers Lindon Romp, nurse  practitioner texted back that she needed to be seen by the psychiatrist.  Patient's commitment papers were changed to hold her in the ED until she can be evaluated by the psychiatrist.    MDM Rules/Calculators/A&P                           Final Clinical Impression(s) / ED Diagnoses Final diagnoses:  Schizoaffective disorder, unspecified type ([place])    Rx / DC Orders  Disposition pending  Rolland Porter, MD,  Barbette Or, MD 02/19/20 [phone]

## 2020-02-19 NOTE — ED Notes (Signed)
Pt arguing with RN states she is voluntary and her rights violated. Informed she is under involuntary commitment and pt argues and states she is not. [employer] called to come explain IVC paperwork to the patient.

## 2020-02-19 NOTE — ED Notes (Signed)
[employer] at bedside to explain IVC paperwork.

## 2020-02-19 NOTE — [facility] Counselor (Signed)
IVC COLLATERAL:  Chief Strategy Officer spoke with Pt's daughter/IVC petitioner [name] [phone]).  Daughter reported that on or about 02/18/20, she petitioned for IVC and also requested an APS investigation after visiting mother and finding her home covered with feces and Pt herself in a state of undress and dirty.    Daughter reported that Pt has a history of Schizoaffective Disorder, Bipolar I type, and that Pt receives outpatient psychiatric services for this disorder through [facility].  Daughter reported that she went through [name] yesterday and saw that Pt has not taken any psychotropic medication since July 2021 (as evidenced by presence of unused bubble-wrap medication containers).  Daughter also reported that Pt has expressed suicidal ideation since June or July and has threatened to overdose.  Daughter also stated that since May, Pt has engaged in potentially dangerous behavior -- per daughter, [name] has contacted daughter and police about Pt receiving numerous female visitors at night, about Pt greeting visitors in the nude, and Pt giving her money to scammers.  Daughter has requested that Pt remain in ED and be treated inpatient.

## 2020-02-19 NOTE — ED Notes (Signed)
Informed pt she meets inpatient criteria. Pt states "I am going to get a lawyer and sue the hospital." Informed the patient this is a decision that [facility] makes and we can not control the decisions that is made and right now she is here under involuntary commitment and that is the decision we have to go by. Pt still upset. Informed pt [facility] is the ones who makes the decisions about inpatient placement or being cleared. AC Tiffany stepped in to talk with patient. Pt still threatening to sue patient and states her family is lying on her. Attempted to calm patient without success.

## 2020-02-19 NOTE — ED Notes (Cosign Needed Addendum)
At the time that initial disposition was made, TTS was not aware that the patient had been petitioned for IVC. TTS will need to collect collateral prior to final disposition.

## 2020-02-19 NOTE — ED Notes (Signed)
[facility] contacted and informed pt requesting to speak with them again to know what changed since she last spoke with them and was informed she was going home.

## 2020-02-20 ENCOUNTER — Encounter: Payer: Self-pay | Admitting: Psychiatry

## 2020-02-20 ENCOUNTER — Other Ambulatory Visit: Payer: Self-pay

## 2020-02-20 ENCOUNTER — Telehealth: Payer: Self-pay

## 2020-02-20 ENCOUNTER — Inpatient Hospital Stay
Admission: AD | Admit: 2020-02-20 | Discharge: 2020-02-26 | DRG: 885 | Disposition: A | Discharge status: Home / Self Care | Payer: Medicare Other | Source: Intra-hospital | Attending: Behavioral Health | Admitting: Behavioral Health

## 2020-02-20 DIAGNOSIS — IMO0002 Reserved for concepts with insufficient information to code with codable children: Secondary | ICD-10-CM | POA: Diagnosis present

## 2020-02-20 DIAGNOSIS — Z833 Family history of diabetes mellitus: Secondary | ICD-10-CM | POA: Diagnosis not present

## 2020-02-20 DIAGNOSIS — K219 Gastro-esophageal reflux disease without esophagitis: Secondary | ICD-10-CM

## 2020-02-20 DIAGNOSIS — Z20822 Contact with and (suspected) exposure to covid-19: Secondary | ICD-10-CM | POA: Diagnosis present

## 2020-02-20 DIAGNOSIS — F250 Schizoaffective disorder, bipolar type: Secondary | ICD-10-CM | POA: Diagnosis present

## 2020-02-20 DIAGNOSIS — F419 Anxiety disorder, unspecified: Secondary | ICD-10-CM | POA: Diagnosis present

## 2020-02-20 DIAGNOSIS — F5104 Psychophysiologic insomnia: Secondary | ICD-10-CM

## 2020-02-20 DIAGNOSIS — K529 Noninfective gastroenteritis and colitis, unspecified: Secondary | ICD-10-CM | POA: Diagnosis not present

## 2020-02-20 DIAGNOSIS — I10 Essential (primary) hypertension: Secondary | ICD-10-CM | POA: Diagnosis not present

## 2020-02-20 DIAGNOSIS — Z85038 Personal history of other malignant neoplasm of large intestine: Secondary | ICD-10-CM | POA: Diagnosis not present

## 2020-02-20 DIAGNOSIS — Z79891 Long term (current) use of opiate analgesic: Secondary | ICD-10-CM

## 2020-02-20 DIAGNOSIS — Z87891 Personal history of nicotine dependence: Secondary | ICD-10-CM

## 2020-02-20 DIAGNOSIS — E785 Hyperlipidemia, unspecified: Secondary | ICD-10-CM | POA: Diagnosis present

## 2020-02-20 DIAGNOSIS — E1165 Type 2 diabetes mellitus with hyperglycemia: Secondary | ICD-10-CM | POA: Diagnosis not present

## 2020-02-20 DIAGNOSIS — G4700 Insomnia, unspecified: Secondary | ICD-10-CM | POA: Diagnosis present

## 2020-02-20 DIAGNOSIS — F201 Disorganized schizophrenia: Secondary | ICD-10-CM | POA: Diagnosis not present

## 2020-02-20 DIAGNOSIS — F259 Schizoaffective disorder, unspecified: Secondary | ICD-10-CM | POA: Diagnosis not present

## 2020-02-20 DIAGNOSIS — E11610 Type 2 diabetes mellitus with diabetic neuropathic arthropathy: Secondary | ICD-10-CM | POA: Diagnosis present

## 2020-02-20 DIAGNOSIS — Z7984 Long term (current) use of oral hypoglycemic drugs: Secondary | ICD-10-CM

## 2020-02-20 DIAGNOSIS — R45851 Suicidal ideations: Secondary | ICD-10-CM | POA: Diagnosis present

## 2020-02-20 DIAGNOSIS — Z7982 Long term (current) use of aspirin: Secondary | ICD-10-CM | POA: Diagnosis not present

## 2020-02-20 DIAGNOSIS — Z9114 Patient's other noncompliance with medication regimen: Secondary | ICD-10-CM

## 2020-02-20 DIAGNOSIS — E1142 Type 2 diabetes mellitus with diabetic polyneuropathy: Secondary | ICD-10-CM

## 2020-02-20 DIAGNOSIS — E1149 Type 2 diabetes mellitus with other diabetic neurological complication: Secondary | ICD-10-CM | POA: Diagnosis present

## 2020-02-20 DIAGNOSIS — Z794 Long term (current) use of insulin: Secondary | ICD-10-CM | POA: Diagnosis not present

## 2020-02-20 DIAGNOSIS — E1169 Type 2 diabetes mellitus with other specified complication: Secondary | ICD-10-CM

## 2020-02-20 DIAGNOSIS — Z79899 Other long term (current) drug therapy: Secondary | ICD-10-CM

## 2020-02-20 DIAGNOSIS — Z9049 Acquired absence of other specified parts of digestive tract: Secondary | ICD-10-CM

## 2020-02-20 DIAGNOSIS — F209 Schizophrenia, unspecified: Secondary | ICD-10-CM | POA: Diagnosis present

## 2020-02-20 LAB — GLUCOSE, CAPILLARY: Glucose-Capillary: 140 mg/dL — ABNORMAL HIGH (ref 70–99)

## 2020-02-20 MED ORDER — HYDROCHLOROTHIAZIDE 25 MG PO TABS
12.5000 mg | ORAL_TABLET | Freq: Every day | ORAL | Status: DC
  Administered 2020-02-21 – 2020-02-26 (×6): 12.5 mg via ORAL
  Filled 2020-02-20 (×6): qty 1

## 2020-02-20 MED ORDER — GABAPENTIN 300 MG PO CAPS
300.0000 mg | ORAL_CAPSULE | Freq: Every day | ORAL | Status: DC
  Administered 2020-02-20 – 2020-02-24 (×5): 300 mg via ORAL
  Filled 2020-02-20 (×5): qty 1

## 2020-02-20 MED ORDER — PANTOPRAZOLE SODIUM 40 MG PO TBEC
40.0000 mg | DELAYED_RELEASE_TABLET | Freq: Two times a day (BID) | ORAL | Status: DC
  Administered 2020-02-20 – 2020-02-26 (×12): 40 mg via ORAL
  Filled 2020-02-20 (×12): qty 1

## 2020-02-20 MED ORDER — ACETAMINOPHEN 325 MG PO TABS
650.0000 mg | ORAL_TABLET | Freq: Four times a day (QID) | ORAL | Status: DC | PRN
  Administered 2020-02-22 – 2020-02-26 (×3): 650 mg via ORAL
  Filled 2020-02-20 (×3): qty 2

## 2020-02-20 MED ORDER — ARIPIPRAZOLE 10 MG PO TABS
10.0000 mg | ORAL_TABLET | Freq: Every day | ORAL | Status: DC
  Administered 2020-02-20: 10 mg via ORAL
  Filled 2020-02-20: qty 1

## 2020-02-20 MED ORDER — ATORVASTATIN CALCIUM 20 MG PO TABS
40.0000 mg | ORAL_TABLET | Freq: Every day | ORAL | Status: DC
  Administered 2020-02-21 – 2020-02-26 (×6): 40 mg via ORAL
  Filled 2020-02-20 (×6): qty 2

## 2020-02-20 MED ORDER — ONDANSETRON HCL 4 MG PO TABS: 4.0000 mg | ORAL_TABLET | Freq: Three times a day (TID) | ORAL | Status: DC | PRN

## 2020-02-20 MED ORDER — METFORMIN HCL 500 MG PO TABS
1000.0000 mg | ORAL_TABLET | Freq: Every day | ORAL | Status: DC
  Administered 2020-02-21 – 2020-02-26 (×6): 1000 mg via ORAL
  Filled 2020-02-20 (×6): qty 2

## 2020-02-20 MED ORDER — BUPROPION HCL ER (XL) 150 MG PO TB24
300.0000 mg | ORAL_TABLET | Freq: Every day | ORAL | Status: DC
  Administered 2020-02-21: 300 mg via ORAL
  Filled 2020-02-20: qty 2

## 2020-02-20 MED ORDER — METOPROLOL SUCCINATE ER 25 MG PO TB24
50.0000 mg | ORAL_TABLET | Freq: Every day | ORAL | Status: DC
  Administered 2020-02-21 – 2020-02-26 (×6): 50 mg via ORAL
  Filled 2020-02-20 (×6): qty 2

## 2020-02-20 MED ORDER — COLESTIPOL HCL 1 G PO TABS
1.0000 g | ORAL_TABLET | Freq: Every day | ORAL | Status: DC
  Administered 2020-02-21 – 2020-02-26 (×6): 1 g via ORAL
  Filled 2020-02-20 (×8): qty 1

## 2020-02-20 MED ORDER — FERROUS SULFATE 325 (65 FE) MG PO TABS
325.0000 mg | ORAL_TABLET | Freq: Every day | ORAL | Status: DC
  Administered 2020-02-21 – 2020-02-26 (×6): 325 mg via ORAL
  Filled 2020-02-20 (×6): qty 1

## 2020-02-20 MED ORDER — MAGNESIUM HYDROXIDE 400 MG/5ML PO SUSP: 30.0000 mL | Freq: Every day | ORAL | Status: DC | PRN

## 2020-02-20 MED ORDER — INSULIN GLARGINE 100 UNIT/ML ~~LOC~~ SOLN
24.0000 [IU] | Freq: Every day | SUBCUTANEOUS | Status: DC
  Administered 2020-02-21 – 2020-02-26 (×6): 24 [IU] via SUBCUTANEOUS
  Filled 2020-02-20 (×7): qty 0.24

## 2020-02-20 MED ORDER — ACETAMINOPHEN 325 MG PO TABS: 650.0000 mg | ORAL_TABLET | Freq: Four times a day (QID) | ORAL | Status: DC | PRN

## 2020-02-20 MED ORDER — ALUM & MAG HYDROXIDE-SIMETH 200-200-20 MG/5ML PO SUSP: 30.0000 mL | ORAL | Status: DC | PRN

## 2020-02-20 MED ORDER — FUROSEMIDE 40 MG PO TABS
40.0000 mg | ORAL_TABLET | Freq: Every day | ORAL | Status: DC
  Administered 2020-02-21 – 2020-02-26 (×6): 40 mg via ORAL
  Filled 2020-02-20 (×7): qty 1

## 2020-02-20 MED ORDER — TRAZODONE HCL 50 MG PO TABS
50.0000 mg | ORAL_TABLET | Freq: Every evening | ORAL | Status: DC | PRN
  Administered 2020-02-20 – 2020-02-25 (×3): 50 mg via ORAL
  Filled 2020-02-20 (×4): qty 1

## 2020-02-20 MED ORDER — ASPIRIN EC 81 MG PO TBEC
81.0000 mg | DELAYED_RELEASE_TABLET | Freq: Every day | ORAL | Status: DC
  Administered 2020-02-21 – 2020-02-26 (×6): 81 mg via ORAL
  Filled 2020-02-20 (×6): qty 1

## 2020-02-20 NOTE — ED Provider Notes (Signed)
Emergency Medicine Observation Re-evaluation Note  SRESHTA CRESSLER is a 54 y.o. female, seen on rounds today.  Pt initially presented to the ED for complaints of V70.1 and Depression Currently, the patient is awaiting tts assessment.  Physical Exam  BP 107/74   Pulse 80   Temp 97.6 F (36.4 C) (Oral)   Resp 18   Ht 1.778 m (5\' 10" )   Wt 106.6 kg   SpO2 98%   BMI 33.72 kg/m  Physical Exam General: No acute distress Cardiac: Regular rate Lungs: Breathing easily, no retractions, no stridor Psych: Calm, cooperative  ED Course / MDM  EKG:    I have reviewed the labs performed to date as well as medications administered while in observation.  Recent changes in the last 24 hours include , pt upset about being in the ED under IVC.  Has expressed desire to leave.  This morning the patient is sitting calmly in the bed.  Waiting for breakfast.  Plan  Current plan is for  Possible inpatient treatment.  Plan is for reassessment Patient is under full IVC at this time.   Dorie Rank, MD 02/20/20 [phone]

## 2020-02-20 NOTE — Tx Team (Signed)
Initial Treatment Plan 02/20/2020 6:57 PM ZADA HASER AQT:[id number]    PATIENT STRESSORS: Health problems Marital or family conflict Medication change or noncompliance   PATIENT STRENGTHS: Average or above average intelligence Communication skills Motivation for treatment/growth   PATIENT IDENTIFIED PROBLEMS: Self care deficit   Anxiety                    DISCHARGE CRITERIA:  Adequate post-discharge living arrangements Medical problems require only outpatient monitoring Verbal commitment to aftercare and medication compliance  PRELIMINARY DISCHARGE PLAN: Attend aftercare/continuing care group Return to previous living arrangement  PATIENT/FAMILY INVOLVEMENT: This treatment plan has been presented to and reviewed with the patient, GEANA WALTS, and/or family member,.  The patient and family have been given the opportunity to ask questions and make suggestions.  [name], RN 02/20/2020, 6:57 PM

## 2020-02-20 NOTE — Progress Notes (Signed)
Patient admitted from [facility] with Schizophrenia.Patient stated that her daughter thinks that patient is not able to take care of herself that why she is in the hospital now.Patient pleasant and cooperative during admission assessment. Patient denies SI/HI at this time. Patient denies AVH. Patient informed of fall risk status, fall risk assessed "high" at this time since patient wearing a boot on her left leg.Patient likes to use wheelchair since she is tired of using the walker. Patient oriented to unit/staff/room. Patient denies any questions/concerns at this time. Patient safe on unit with Q15 minute checks for safety. Skin assessment and body search done,no contraband found.

## 2020-02-20 NOTE — BH Assessment (Signed)
Patient has been accepted to [facility].  Accepting physician is Dr. Weber Cooks.  Attending  Physician will be Dr. Domingo Cocking.  Patient has been assigned to room 320, by [facility].   Call report to [phone].  Representative/Transfer Coordinator is Dispensing optician Patient pre-admitted by [facility] ([facility])  [facility] Staff Judson Roch, Disposition Social Worker) made aware of acceptance.

## 2020-02-20 NOTE — ED Notes (Signed)
Assumed care of pt at 0700 

## 2020-02-20 NOTE — Consult Note (Signed)
Chart reviewed.  Tentative acceptance to [facility] pending bed availability and acceptance by nursing.

## 2020-02-20 NOTE — ED Notes (Signed)
Pt upset at this time. States she came here voluntary and she is ready to leave. Explained to pt that right now she is involuntary and that she will have to wait to speak with TTS in the morning. Pt stating that she is going to sue the hospital and [employer] for keeping her here against her will. Attempted to calm pt without success.

## 2020-02-21 DIAGNOSIS — F201 Disorganized schizophrenia: Secondary | ICD-10-CM

## 2020-02-21 LAB — GLUCOSE, CAPILLARY: Glucose-Capillary: 158 mg/dL — ABNORMAL HIGH (ref 70–99)

## 2020-02-21 LAB — RESPIRATORY PANEL BY RT PCR (FLU A&B, COVID)
Influenza A by PCR: NEGATIVE
Influenza B by PCR: NEGATIVE
SARS Coronavirus 2 by RT PCR: NEGATIVE

## 2020-02-21 MED ORDER — BUPROPION HCL ER (XL) 150 MG PO TB24
150.0000 mg | ORAL_TABLET | Freq: Every day | ORAL | Status: DC
  Administered 2020-02-22 – 2020-02-26 (×5): 150 mg via ORAL
  Filled 2020-02-21 (×5): qty 1

## 2020-02-21 MED ORDER — ARIPIPRAZOLE 5 MG PO TABS
15.0000 mg | ORAL_TABLET | Freq: Every day | ORAL | Status: DC
  Administered 2020-02-21 – 2020-02-25 (×5): 15 mg via ORAL
  Filled 2020-02-21 (×5): qty 1

## 2020-02-21 NOTE — Progress Notes (Signed)
Patient is alert and oriented x 4, affect is blunted she appears restless and anxious, she made eye contact with staff, speech is soft but tangential her thoughts are organized  she was offered emotional support, and she was receptive to staff. Patient denies SI/HI/AVH she was complaint with medication regimen, no distress noted will continue to monitor.

## 2020-02-21 NOTE — [facility] Suicide Risk Assessment (Signed)
[facility] Admission Suicide Risk Assessment   Nursing information obtained from:  Patient Demographic factors:  Living alone, Unemployed, Divorced or widowed, Caucasian Current Mental Status:  NA Loss Factors:  NA Historical Factors:  NA Risk Reduction Factors:  Positive social support  Total Time spent with patient: 1 hour Principal Problem: Schizophrenia ([place]) Diagnosis:  Principal Problem:   Schizophrenia ([place]) Active Problems:   DM (diabetes mellitus), type 2, 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[place])   Essential hypertension   Hyperlipidemia associated with type 2 diabetes mellitus ([place])  Subjective Data: Patient seen during treatment team and again one-on-one in her room. She reports that is treated outpatient for HTN, diabetes, and schizophrenia. She states her medications work for her, but she does not take them regularly. She reports she is able to afford the medications, she denies that she forgets her medications, and denies that they give her any side effects. She states she simply does not know why she doesn't take her medications. However, she notes that off her medications she has started to make bad decisions. She has been speaking to multiple men online, and opening joint bank accounts with them. She notes she is giving away a lot of money, and those men are not giving her any. She notes that she should stop doing this, and notes it is out of character for her. She also has concerns about her deconditioning as she has been in a boot for several weeks, and using a wheelchair. Will resume medications, and order a PT consult.   Continued Clinical Symptoms:  Alcohol Use Disorder Identification Test Final Score (AUDIT): 0 The "Alcohol Use Disorders Identification Test", Guidelines for Use in Primary Care, Second Edition.  World Pharmacologist [facility]). Score between 0-7:  no or low risk or alcohol related problems. Score between 8-15:  moderate risk of alcohol related  problems. Score between 16-19:  high risk of alcohol related problems. Score 20 or above:  warrants further diagnostic evaluation for alcohol dependence and treatment.   CLINICAL FACTORS:   Depression:   Impulsivity Insomnia Schizophrenia:   Depressive state Paranoid or undifferentiated type Chronic Pain More than one psychiatric diagnosis Previous Psychiatric Diagnoses and Treatments Medical Diagnoses and Treatments/Surgeries   Musculoskeletal: Strength & Muscle Tone: decreased Gait & Station: using a wheelchair Patient leans: N/A  Psychiatric Specialty Exam: Physical Exam Vitals and nursing note reviewed.  Constitutional:      Appearance: Normal appearance.  HENT:     Head: Normocephalic and atraumatic.     Right Ear: External ear normal.     Left Ear: External ear normal.     Nose: Nose normal.     Mouth/Throat:     Mouth: Mucous membranes are moist.     Pharynx: Oropharynx is clear.  Eyes:     Extraocular Movements: Extraocular movements intact.     Conjunctiva/sclera: Conjunctivae normal.     Pupils: Pupils are equal, round, and reactive to light.  Cardiovascular:     Rate and Rhythm: Normal rate.     Pulses: Normal pulses.  Pulmonary:     Effort: Pulmonary effort is normal.     Breath sounds: Normal breath sounds.  Abdominal:     General: Abdomen is flat.     Palpations: Abdomen is soft.  Musculoskeletal:        General: No swelling. Normal range of motion.     Cervical back: Normal range of motion and neck supple.  Skin:    General: Skin is warm and dry.  Neurological:     Mental Status: She is alert.     Motor: Weakness present.     Gait: Gait abnormal.  Psychiatric:        Attention and Perception: She does not perceive auditory or visual hallucinations.        Mood and Affect: Mood is anxious. Affect is inappropriate.        Speech: Speech normal.        Behavior: Behavior normal.        Thought Content: Thought content is delusional.         Cognition and Memory: Cognition is impaired.        Judgment: Judgment is impulsive.     Review of Systems  Constitutional: Positive for activity change and fatigue.  HENT: Negative for rhinorrhea and sore throat.   Eyes: Negative for photophobia and visual disturbance.  Respiratory: Negative for cough and shortness of breath.   Cardiovascular: Negative for chest pain and palpitations.  Gastrointestinal: Negative for constipation, diarrhea, nausea and vomiting.  Endocrine: Negative for cold intolerance and heat intolerance.  Genitourinary: Negative for difficulty urinating and dysuria.  Musculoskeletal: Positive for arthralgias, back pain, gait problem and joint swelling.  Skin: Negative for rash and wound.  Allergic/Immunologic: Negative for environmental allergies and food allergies.  Neurological: Negative for dizziness and syncope.  Hematological: Negative for adenopathy. Does not bruise/bleed easily.  Psychiatric/Behavioral: Positive for behavioral problems, confusion, decreased concentration and sleep disturbance. The patient is nervous/anxious and is hyperactive.     Blood pressure 116/68, pulse 91, temperature (!) 97.5 F (36.4 C), temperature source Oral, resp. rate 18, height 5\' 10"  (1.778 m), weight 106.6 kg, SpO2 100 %.Body mass index is 33.72 kg/m.  General Appearance: Fairly Groomed and wearing a boot to left foot, sitting in wheelchair  Eye Contact:  Good  Speech:  Normal Rate  Volume:  Decreased  Mood:  Anxious  Affect:  Inappropriate  Thought Process:  Disorganized  Orientation:  Full (Time, Place, and Person)  Thought Content:  Logical  Suicidal Thoughts:  No  Homicidal Thoughts:  No  Memory:  Immediate;   Fair Recent;   Fair Remote;   Fair  Judgement:  Impaired  Insight:  Lacking  Psychomotor Activity:  Decreased  Concentration:  Concentration: Fair and Attention Span: Fair  Recall:  [employer] of Knowledge:  Fair  Language:  Fair  Akathisia:  Negative   Handed:  Right  AIMS (if indicated):     Assets:  Communication Skills Desire for Improvement Housing Social Support  ADL's:  Impaired  Cognition:  Impaired,  Moderate  Sleep:         COGNITIVE FEATURES THAT CONTRIBUTE TO RISK:  Loss of executive function    SUICIDE RISK:   Moderate:  Frequent suicidal ideation with limited intensity, and duration, some specificity in terms of plans, no associated intent, good self-control, limited dysphoria/symptomatology, some risk factors present, and identifiable protective factors, including available and accessible social support.  PLAN OF CARE: Continue inpatient admission. Restart home medications. Will increase Abilify to 15 mg daily for treatment of schizophrenia, decrease Wellbutrin to 150 mg daily with plan to discontinue given concerns for manic-like behavior. Will ask physical therapy to evaluate and treat for deconditioning.   I certify that inpatient services furnished can reasonably be expected to improve the patient's condition.   Salley Scarlet, MD 02/21/2020, 11:24 AM

## 2020-02-21 NOTE — Evaluation (Signed)
Physical Therapy Evaluation Patient Details Name: [name] MRN: [id number] DOB: [birth year] Today's Date: 02/21/2020   History of Present Illness  Pt is a 54 year old female that transferred to [facility] from [facility], per chart pt initially presented to ED at family's request due to not taking caring of herself. PMH of schizoaffective disorder, hypertension, diabetes, depression.    Clinical Impression  Pt alert, agreeable to PT, returned to room from group session to speak with therapist. Pt reported that she lives alone in a second floor apartment (elevator available). Primarily using WC due to L foot cast boot, but stated she is able to ambulate with her walker she is just "lazy". Independent for ADLs, family/frined assists with driving/errands as needed.  The patient demonstrated UE and LE WFLs. She was able to sit <> stand several times from [employer] [facility] [employer] mod I with use of RW. She ambulated ~41ft total with RW modI, no LOB noted, reciprocal gait pattern. She was able to stand statically without UE support. Overall she demonstrated mild deficits in function and mobility and would benefit from further skilled PT intervention to maximize QOL and function. She did express that should would like to speak with social work, Therapist, sports notified.    Follow Up Recommendations Home health PT    Equipment Recommendations  Other (comment) (tub bench, rollator)    Recommendations for Other Services       Precautions / Restrictions Precautions Precautions: Fall Precaution Comments: low fall Required Braces or Orthoses: Other Brace Other Brace: L boot Restrictions Weight Bearing Restrictions: No Other Position/Activity Restrictions: no weight bearing restrictions per pt      Mobility  Bed Mobility               General bed mobility comments: deferred pt up in [facility]    Transfers Overall transfer level: Modified independent Equipment used: Rolling walker (2 wheeled)                 Ambulation/Gait Ambulation/Gait assistance: Modified independent (Device/Increase time) Gait Distance (Feet): 80 Feet Assistive device: Rolling walker (2 wheeled)       General Gait Details: RW adjusted to height, but pt did not need physical assistance for ambulation, reciprocal gait pattern noted  Stairs            Wheelchair Mobility    Modified Rankin (Stroke Patients Only)       Balance Overall balance assessment: Needs assistance Sitting-balance support: Feet supported Sitting balance-Leahy Scale: Normal       Standing balance-Leahy Scale: Fair Standing balance comment: pt able to statically stand without UE support, but improved comfort and safety noted with RW                             Pertinent Vitals/Pain Pain Assessment: No/denies pain    Home Living Family/patient expects to be discharged to:: Private residence Living Arrangements: Alone Available Help at Discharge: Family;Available PRN/intermittently Type of Home: Apartment Home Access: Elevator     Home Layout: One level Home Equipment: Walker - 2 wheels;Wheelchair - manual Additional Comments: denies falls    Prior Function           Comments: primarily uses WC for mobility, but able to walk with RW     Hand Dominance   Dominant Hand: Right    Extremity/Trunk Assessment   Upper Extremity Assessment Upper Extremity Assessment: Overall WFL for tasks assessed    Lower Extremity  Assessment Lower Extremity Assessment: Overall WFL for tasks assessed    Cervical / Trunk Assessment Cervical / Trunk Assessment: Normal  Communication   Communication: No difficulties  Cognition Arousal/Alertness: Awake/alert Behavior During Therapy: WFL for tasks assessed/performed Overall Cognitive Status: Within Functional Limits for tasks assessed                                        General Comments      Exercises     Assessment/Plan    PT Assessment  Patient needs continued PT services  PT Problem List Decreased activity tolerance;Decreased balance;Decreased knowledge of use of DME       PT Treatment Interventions Therapeutic exercise;DME instruction;Gait training;Balance training;Stair training;Neuromuscular re-education;Functional mobility training;Therapeutic activities;Patient/family education    PT Goals (Current goals can be found in the Care Plan section)  Acute Rehab PT Goals Patient Stated Goal: to get better and go home PT Goal Formulation: With patient Time For Goal Achievement: 03/06/20 Potential to Achieve Goals: Good    Frequency Min 2X/week   Barriers to discharge        Co-evaluation               AM-PAC PT "6 Clicks" Mobility  Outcome Measure Help needed turning from your back to your side while in a flat bed without using bedrails?: None Help needed moving from lying on your back to sitting on the side of a flat bed without using bedrails?: None Help needed moving to and from a bed to a chair (including a wheelchair)?: None Help needed standing up from a chair using your arms (e.g., wheelchair or bedside chair)?: None Help needed to walk in hospital room?: None Help needed climbing 3-5 steps with a railing? : A Little 6 Click Score: 23    End of Session Equipment Utilized During Treatment: Gait belt Activity Tolerance: Patient tolerated treatment well Patient left: Other (comment) (in [place] in hallway) Nurse Communication: Mobility status;Other (comment) (pt request to speak with social work) PT Visit Diagnosis: Other abnormalities of gait and mobility (R26.89)    Time: 1310-1330 PT Time Calculation (min) (ACUTE ONLY): 20 min   Charges:   PT Evaluation $PT Eval Low Complexity: 1 Low PT Treatments $Therapeutic Exercise: 8-22 mins        Lieutenant Diego PT, DPT 1:47 PM,02/21/20

## 2020-02-21 NOTE — Progress Notes (Signed)
Recreation Therapy Notes  INPATIENT RECREATION THERAPY ASSESSMENT  Patient Details Name: [name] MRN: [id number] DOB: [birth year] Today's Date: 02/21/2020       Information Obtained From: Patient  Able to Participate in Assessment/Interview: Yes  Patient Presentation: Responsive  Reason for Admission (Per Patient): Active Symptoms, Med Non-Compliance  Patient Stressors:    Coping Skills:   Isolation, Talk, Prayer, Avoidance  Leisure Interests (2+):  Social - Family, Music - Listen  Frequency of Recreation/Participation:    Awareness of Community Resources:     Community Resources:     Current Use:    If no, Barriers?:    Expressed Interest in [place] of Residence:  Acupuncturist  Patient Main Form of Transportation: Diplomatic Services operational officer  Patient Strengths:  Nice  Patient Identified Areas of Improvement:  Do daily activities  Patient Goal for Hospitalization:  To get on medication  Current SI (including self-harm):  No  Current HI:  No  Current AVH: No  Staff Intervention Plan: Group Attendance, Collaborate with Interdisciplinary Treatment Team  Consent to Intern Participation: N/A  [name] 02/21/2020, 2:28 PM

## 2020-02-21 NOTE — Plan of Care (Signed)
D- Patient alert and oriented. Patient presents in a pleasant mood on assessment stating that she slept good last night and had no complaints to voice to this Probation officer. Patient reported both depression/anxiety, however, she did not go into detail with this Probation officer as to why she feels this way. Patient denies SI, HI, AVH, and pain at this time. Patient's goal for today is "to withdraw from the Internet", in which she will "think positive", in order to achieve her goal.  A- Scheduled medications administered to patient, per MD orders. Support and encouragement provided.  Routine safety checks conducted every 15 minutes.  Patient informed to notify staff with problems or concerns.  R- No adverse drug reactions noted. Patient contracts for safety at this time. Patient compliant with medications and treatment plan. Patient receptive, calm, and cooperative. Patient interacts well with others on the unit.  Patient remains safe at this time.  Problem: Education: Goal: Knowledge of [place] [facility] Education information/materials will improve Outcome: Progressing Goal: Mental status will improve Outcome: Progressing Goal: Verbalization of understanding the information provided will improve Outcome: Progressing   Problem: Health Behavior/Discharge Planning: Goal: Identification of resources available to assist in meeting health care needs will improve Outcome: Progressing Goal: Compliance with treatment plan for underlying cause of condition will improve Outcome: Progressing   Problem: Activity: Goal: Will identify at least one activity in which they can participate Outcome: Progressing   Problem: Coping: Goal: Ability to identify and develop effective coping behavior will improve Outcome: Progressing Goal: Ability to interact with others will improve Outcome: Progressing Goal: Demonstration of participation in decision-making regarding own care will improve Outcome: Progressing Goal: Ability to  use eye contact when communicating with others will improve Outcome: Progressing   Problem: Self-Concept: Goal: Will verbalize positive feelings about self Outcome: Progressing

## 2020-02-21 NOTE — Progress Notes (Signed)
Recreation Therapy Notes   Date: 02/21/2020  Time: 9:30 am   Location: Craft room     Behavioral response: N/A   Intervention Topic: Communication   Discussion/Intervention: Patient did not attend group.   Clinical Observations/Feedback:  Patient did not attend group.   Tyshell Ramberg LRT/CTRS         Alexa Blish 02/21/2020 11:39 AM

## 2020-02-21 NOTE — Tx Team (Addendum)
Interdisciplinary Treatment and Diagnostic Plan Update  02/21/2020 Time of Session: 9:00AM [name] MRN: [id number]  Principal Diagnosis: <principal problem not specified>  Secondary Diagnoses: Active Problems:   Schizophrenia ([place])   Current Medications:  Current Facility-Administered Medications  Medication Dose Route Frequency Provider Last Rate Last Admin   acetaminophen (TYLENOL) tablet 650 mg  650 mg Oral Q6H PRN Clapacs, Madie [place], MD       alum & mag hydroxide-simeth (MAALOX/MYLANTA) 200-200-20 MG/5ML suspension 30 mL  30 mL Oral Q4H PRN Clapacs, Madie Reno, MD       ARIPiprazole (ABILIFY) tablet 10 mg  10 mg Oral QHS Clapacs, John T, MD   10 mg at 02/20/20 2133   aspirin EC tablet 81 mg  81 mg Oral Daily Clapacs, Madie Reno, MD   81 mg at 02/21/20 0902   atorvastatin (LIPITOR) tablet 40 mg  40 mg Oral Daily Clapacs, Madie Reno, MD   40 mg at 02/21/20 0175   buPROPion (WELLBUTRIN XL) 24 hr tablet 300 mg  300 mg Oral Daily Clapacs, John T, MD   300 mg at 02/21/20 0901   colestipol (COLESTID) tablet 1 g  1 g Oral Daily Clapacs, John T, MD   1 g at 02/21/20 0901   ferrous sulfate tablet 325 mg  325 mg Oral Daily Clapacs, Madie Reno, MD   325 mg at 02/21/20 0901   furosemide (LASIX) tablet 40 mg  40 mg Oral Daily Clapacs, Madie Reno, MD   40 mg at 02/21/20 0901   gabapentin (NEURONTIN) capsule 300 mg  300 mg Oral QHS Clapacs, John T, MD   300 mg at 02/20/20 2133   hydrochlorothiazide (HYDRODIURIL) tablet 12.5 mg  12.5 mg Oral Daily Clapacs, John T, MD   12.5 mg at 02/21/20 1025   insulin glargine (LANTUS) injection 24 Units  24 Units Subcutaneous Daily Clapacs, John T, MD   24 Units at 02/21/20 0900   magnesium hydroxide (MILK OF MAGNESIA) suspension 30 mL  30 mL Oral Daily PRN Clapacs, Madie Reno, MD       metFORMIN (GLUCOPHAGE) tablet 1,000 mg  1,000 mg Oral Q breakfast Clapacs, John T, MD   1,000 mg at 02/21/20 8527   metoprolol succinate (TOPROL-XL) 24 hr tablet 50 mg  50 mg Oral  Daily Clapacs, John T, MD   50 mg at 02/21/20 0901   ondansetron (ZOFRAN) tablet 4 mg  4 mg Oral Q8H PRN Clapacs, Madie Reno, MD       pantoprazole (PROTONIX) EC tablet 40 mg  40 mg Oral BID AC Clapacs, John T, MD   40 mg at 02/21/20 0901   traZODone (DESYREL) tablet 50 mg  50 mg Oral QHS PRN Clapacs, Madie Reno, MD   50 mg at 02/20/20 2133   PTA Medications: Medications Prior to Admission  Medication Sig Dispense Refill Last Dose   acetaminophen (TYLENOL) 325 MG tablet Take 2 tablets (650 mg total) by mouth every 6 (six) hours as needed for mild pain.      ARIPiprazole (ABILIFY) 10 MG tablet TAKE 1 TABLET BY MOUTH AT BEDTIME. 30 tablet 0    aspirin EC 81 MG tablet Take 1 tablet (81 mg total) by mouth daily. (Patient not taking: Reported on 02/19/2020) 90 tablet 3    atorvastatin (LIPITOR) 40 MG tablet Take 1 tablet (40 mg total) by mouth daily. (Patient not taking: Reported on 02/19/2020) 90 tablet 3    Blood Glucose Monitoring Suppl (ONETOUCH VERIO) w/Device KIT Use  to check blood sugar up to 3 x daily 1 kit 0    buPROPion (WELLBUTRIN XL) 300 MG 24 hr tablet TAKE 1 TABLET BY MOUTH EACH MORNING. (Patient taking differently: Take 300 mg by mouth daily. ) 30 tablet 0    colestipol (COLESTID) 1 g tablet Take 2 tablets (2 g total) by mouth 2 (two) times daily as needed (diarrhea). Take only if having diarrhea, may not need to take regularly. (Patient not taking: Reported on 02/19/2020) 120 tablet 2    Ferrous Sulfate 27 MG TABS Take 27 mg by mouth daily.       furosemide (LASIX) 40 MG tablet Take 1 tablet (40 mg total) by mouth daily. 90 tablet 3    gabapentin (NEURONTIN) 300 MG capsule Take 1 capsule (300 mg total) by mouth at bedtime. (Patient not taking: Reported on 02/19/2020) 90 capsule 3    hydrochlorothiazide (HYDRODIURIL) 12.5 MG tablet Take 1 tablet (12.5 mg total) by mouth daily. 90 tablet 3    insulin glargine (LANTUS) 100 UNIT/ML Solostar Pen Inject 24 Units into the skin daily.  Each morning      Lancets (ONETOUCH ULTRASOFT) lancets Use to check blood sugar up to 3 x daily 100 each 12    metFORMIN (GLUCOPHAGE) 1000 MG tablet Take 1,000 mg by mouth daily.       metoprolol succinate (TOPROL-XL) 50 MG 24 hr tablet Take 1 tablet (50 mg total) by mouth daily. Take with or immediately following a meal. 90 tablet 3    ondansetron (ZOFRAN) 4 MG tablet Take 1 tablet (4 mg total) by mouth every 6 (six) hours. (Patient taking differently: Take 4 mg by mouth every 8 (eight) hours as needed for nausea or vomiting. ) 12 tablet 0    ONETOUCH VERIO test strip Check blood sugar up to 5 x daily as needed 450 each 3    pantoprazole (PROTONIX) 40 MG tablet Take 1 tablet (40 mg total) by mouth 2 (two) times daily before a meal. 180 tablet 3    traZODone (DESYREL) 50 MG tablet TAKE 1 TABLET BY MOUTH AT BEDTIME AS NEEDED FOR SLEEP. (Patient not taking: Reported on 02/19/2020) 30 tablet 2    vitamin B-12 1000 MCG tablet Take 1 tablet (1,000 mcg total) by mouth daily.       Patient Stressors: Health problems Marital or family conflict Medication change or noncompliance  Patient Strengths: Average or above average intelligence Communication skills Motivation for treatment/growth  Treatment Modalities: Medication Management, Group therapy, Case management,  1 to 1 session with clinician, Psychoeducation, Recreational therapy.   Physician Treatment Plan for Primary Diagnosis: <principal problem not specified> Long Term Goal(s):     Short Term Goals:    Medication Management: Evaluate patient's response, side effects, and tolerance of medication regimen.  Therapeutic Interventions: 1 to 1 sessions, Unit Group sessions and Medication administration.  Evaluation of Outcomes: Not Met  Physician Treatment Plan for Secondary Diagnosis: Active Problems:   Schizophrenia ([place])  Long Term Goal(s):     Short Term Goals:       Medication Management: Evaluate patient's response,  side effects, and tolerance of medication regimen.  Therapeutic Interventions: 1 to 1 sessions, Unit Group sessions and Medication administration.  Evaluation of Outcomes: Not Met   RN Treatment Plan for Primary Diagnosis: <principal problem not specified> Long Term Goal(s): Knowledge of disease and therapeutic regimen to maintain health will improve  Short Term Goals: Ability to demonstrate self-control, Ability to verbalize feelings will improve,  Ability to disclose and discuss suicidal ideas, Ability to identify and develop effective coping behaviors will improve and Compliance with prescribed medications will improve  Medication Management: RN will administer medications as ordered by provider, will assess and evaluate patient's response and provide education to patient for prescribed medication. RN will report any adverse and/or side effects to prescribing provider.  Therapeutic Interventions: 1 on 1 counseling sessions, Psychoeducation, Medication administration, Evaluate responses to treatment, Monitor vital signs and CBGs as ordered, Perform/monitor CIWA, COWS, AIMS and Fall Risk screenings as ordered, Perform wound care treatments as ordered.  Evaluation of Outcomes: Not Met   LCSW Treatment Plan for Primary Diagnosis: <principal problem not specified> Long Term Goal(s): Safe transition to appropriate next level of care at discharge, Engage patient in therapeutic group addressing interpersonal concerns.  Short Term Goals: Engage patient in aftercare planning with referrals and resources, Increase social support, Increase ability to appropriately verbalize feelings, Increase emotional regulation, Facilitate acceptance of mental health diagnosis and concerns and Increase skills for wellness and recovery  Therapeutic Interventions: Assess for all discharge needs, 1 to 1 time with Social worker, Explore available resources and support systems, Assess for adequacy in community support  network, Educate family and significant other(s) on suicide prevention, Complete Psychosocial Assessment, Interpersonal group therapy.  Evaluation of Outcomes: Not Met   Progress in Treatment: Attending groups: No. Participating in groups: No. Taking medication as prescribed: Yes. Toleration medication: Yes. Family/Significant other contact made: No, will contact:  once permission is given. Patient understands diagnosis: Yes. Discussing patient identified problems/goals with staff: Yes. Medical problems stabilized or resolved: Yes. Denies suicidal/homicidal ideation: Yes. Issues/concerns per patient self-inventory: No. Other: none  New problem(s) identified: No, Describe:  none  New Short Term/Long Term Goal(s): detox, elimination of symptoms of psychosis, medication management for mood stabilization; elimination of SI thoughts; development of comprehensive mental wellness/sobriety plan.   Patient Goals:  "back on my meds and a little bit stronger"  Discharge Plan or Barriers: CSW will assist patient in developing aftercare plans.    Reason for Continuation of Hospitalization: Anxiety Depression Medication stabilization  Estimated Length of Stay:  1-7 days  Recreational Therapy: Patient Stressors: N/A Patient Goal: Patient will engage in groups without prompting or encouragement from LRT x3 group sessions within 5 recreation therapy group sessions.    Attendees: Patient: [name] 02/21/2020 10:26 AM  Physician: Dr. Domingo Cocking, MD 02/21/2020 10:26 AM  Nursing: Graceann Congress, RN 02/21/2020 10:26 AM  RN Care Manager: 02/21/2020 10:26 AM  Social Worker: Assunta Curtis, LCSW 02/21/2020 10:26 AM  Recreational Therapist: Roanna Epley, Reather Converse, LRT 02/21/2020 10:26 AM  Other: Michell Heinrich, LCSW 02/21/2020 10:26 AM  Other:  02/21/2020 10:26 AM  Other: 02/21/2020 10:26 AM    Scribe for Treatment Team: Rozann Lesches, LCSW 02/21/2020 10:26 AM

## 2020-02-21 NOTE — H&P (Signed)
Psychiatric Admission Assessment Adult  Patient Identification: [name] MRN:  [id number] Date of Evaluation:  02/21/2020 Chief Complaint:  Schizophrenia ([place]) [F20.9] Principal Diagnosis: Schizophrenia ([place]) Diagnosis:  Principal Problem:   Schizophrenia ([place]) Active Problems:   DM (diabetes mellitus), type 2, 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[place])   Essential hypertension   Hyperlipidemia associated with type 2 diabetes mellitus ([place])  History of Present Illness:   Patient seen during treatment team and again one-on-one in her room. She reports that is treated outpatient for HTN, diabetes, and schizophrenia. She states her medications work for her, but she does not take them regularly. She reports she is able to afford the medications, she denies that she forgets her medications, and denies that they give her any side effects. She states she simply does not know why she doesn't take her medications. However, she notes that off her medications she has started to make bad decisions. She has been speaking to multiple men online, and opening joint bank accounts with them. She notes she is giving away a lot of money, and those men are not giving her any. She notes that she should stop doing this, and notes it is out of character for her. She also has concerns about her deconditioning as she has been in a boot for several weeks, and using a wheelchair. Will resume medications, and order a PT consult.   Associated Signs/Symptoms: Depression Symptoms:  depressed mood, insomnia, hopelessness, anxiety, decreased appetite, Duration of Depression Symptoms: No data recorded (Hypo) Manic Symptoms:  Elevated Mood, Financial Extravagance, Impulsivity, Sexually Inapproprite Behavior, Anxiety Symptoms:  Excessive Worry, Psychotic Symptoms:  Paranoia, Duration of Psychotic Symptoms: No data recorded PTSD Symptoms: Negative Total Time spent with patient: 1 hour  Past Psychiatric  History: Pt has a history of Schizoaffective Disorder, Bipolar I type, and that Pt receives outpatient psychiatric services for this disorder through [place]. Daughter reported that she went through [name] yesterday and saw that Pt has not taken any psychotropic medication since July 2021 (as evidenced by presence of unused bubble-wrap medication containers).  Daughter also reported that Pt has expressed suicidal ideation since June or July and has threatened to overdose.  Daughter also stated that since May, Pt has engaged in potentially dangerous behavior -- per daughter, [name] has contacted daughter and police about Pt receiving numerous female visitors at night, about Pt greeting visitors in the nude, and Pt giving her money to scammers.  Is the patient at risk to self? Yes.    Has the patient been a risk to self in the past 6 months? Yes.    Has the patient been a risk to self within the distant past? Yes.    Is the patient a risk to others? No.  Has the patient been a risk to others in the past 6 months? No.  Has the patient been a risk to others within the distant past? No.   Prior Inpatient Therapy:   Prior Outpatient Therapy:    Alcohol Screening: 1. How often do you have a drink containing alcohol?: Never 2. How many drinks containing alcohol do you have on a typical day when you are drinking?: 1 or 2 3. How often do you have six or more drinks on one occasion?: Never AUDIT-C Score: 0 4. How often during the last year have you found that you were not able to stop drinking once you had started?: Never 5. How often during the last year have you failed to do what  was normally expected from you because of drinking?: Never 6. How often during the last year have you needed a first drink in the morning to get yourself going after a heavy drinking session?: Never 7. How often during the last year have you had a feeling of guilt of remorse after drinking?: Never 8. How often during the last  year have you been unable to remember what happened the night before because you had been drinking?: Never 9. Have you or someone else been injured as a result of your drinking?: No 10. Has a relative or friend or a doctor or another health worker been concerned about your drinking or suggested you cut down?: No Alcohol Use Disorder Identification Test Final Score (AUDIT): 0 Alcohol Brief Interventions/Follow-up: AUDIT Score <7 follow-up not indicated Substance Abuse History in the last 12 months:  No. Consequences of Substance Abuse: Negative Previous Psychotropic Medications: Yes  Psychological Evaluations: Yes  Past Medical History:  Past Medical History:  Diagnosis Date  . Charcot's joint of foot, left   . Depression   . Diabetes mellitus without complication ([place])   . Hyperlipidemia   . Hypertension   . Neuromuscular disorder ([place])   . Schizo affective schizophrenia ([place])    abilify and pazil    Past Surgical History:  Procedure Laterality Date  . CHOLECYSTECTOMY    . COLONOSCOPY WITH PROPOFOL N/A 05/26/2019   Procedure: COLONOSCOPY WITH PROPOFOL;  Surgeon: Toledo, Benay Pike, MD;  Location: [facility] ENDOSCOPY;  Service: Gastroenterology;  Laterality: N/A;  . ESOPHAGOGASTRODUODENOSCOPY (EGD) WITH PROPOFOL N/A 05/26/2019   Procedure: ESOPHAGOGASTRODUODENOSCOPY (EGD) WITH PROPOFOL;  Surgeon: Toledo, Benay Pike, MD;  Location: [facility] ENDOSCOPY;  Service: Gastroenterology;  Laterality: N/A;  . IRRIGATION AND DEBRIDEMENT FOOT Right 02/26/2016   Procedure: RIGHT 2ND TOE DEBRIDEMENT;  Surgeon: Samara Deist, DPM;  Location: [facility] ORS;  Service: Podiatry;  Laterality: Right;  . IRRIGATION AND DEBRIDEMENT FOOT Left 12/13/2018   Procedure: IRRIGATION AND DEBRIDEMENT FOOT;  Surgeon: Caroline More, DPM;  Location: [facility] ORS;  Service: Podiatry;  Laterality: Left;   Family History:  Family History  Problem Relation Age of Onset  . Breast cancer Cousin        maternal cousin  . Hypertension Mother    . Hyperthyroidism Mother   . Dementia Mother   . Prostate cancer Father   . Diabetes Maternal Grandmother   . Hypertension Maternal Grandmother   . Cancer Maternal Grandmother        unknown type  . Diabetes Maternal Grandfather   . Hypertension Maternal Grandfather   . Diabetes Paternal Grandmother   . Hypertension Paternal Grandmother   . Diabetes Paternal Grandfather   . Hypertension Paternal Grandfather    Family Psychiatric  History: Denies Tobacco Screening: Have you used any form of tobacco in the last 30 days? (Cigarettes, Smokeless Tobacco, Cigars, and/or Pipes): No Social History:  Social History   Substance and Sexual Activity  Alcohol Use No     Social History   Substance and Sexual Activity  Drug Use No    Additional Social History:  Allergies:  No Known Allergies Lab Results:  Results for orders placed or performed during the hospital encounter of 02/20/20 (from the past 48 hour(s))  Glucose, capillary     Status: Abnormal   Collection Time: 02/20/20  8:32 PM  Result Value Ref Range   Glucose-Capillary 140 (H) 70 - 99 mg/dL    Comment: Glucose reference range applies only to samples taken after fasting for at  least 8 hours.   Comment 1 Notify RN   Glucose, capillary     Status: Abnormal   Collection Time: 02/21/20  6:55 AM  Result Value Ref Range   Glucose-Capillary 158 (H) 70 - 99 mg/dL    Comment: Glucose reference range applies only to samples taken after fasting for at least 8 hours.   Comment 1 Notify RN   Respiratory Panel by RT PCR (Flu A&B, Covid) - Nasopharyngeal Swab     Status: None   Collection Time: 02/21/20 10:37 AM   Specimen: Nasopharyngeal Swab  Result Value Ref Range   SARS Coronavirus 2 by RT PCR NEGATIVE NEGATIVE    Comment: (NOTE) SARS-CoV-2 target nucleic acids are NOT DETECTED.  The SARS-CoV-2 RNA is generally detectable in upper respiratoy specimens during the acute phase of infection. The lowest concentration of SARS-CoV-2  viral copies this assay can detect is 131 copies/mL. A negative result does not preclude SARS-Cov-2 infection and should not be used as the sole basis for treatment or other patient management decisions. A negative result may occur with  improper specimen collection/handling, submission of specimen other than nasopharyngeal swab, presence of viral mutation(s) within the areas targeted by this assay, and inadequate number of viral copies (<131 copies/mL). A negative result must be combined with clinical observations, patient history, and epidemiological information. The expected result is Negative.  Fact Sheet for Patients:  PinkCheek.be  Fact Sheet for Healthcare Providers:  GravelBags.it  This test is no t yet approved or cleared by the [place] FDA and  has been authorized for detection and/or diagnosis of SARS-CoV-2 by FDA under an Emergency Use Authorization (EUA). This EUA will remain  in effect (meaning this test can be used) for the duration of the COVID-19 declaration under Section 564(b)(1) of the Act, 21 U.S.C. section 360bbb-3(b)(1), unless the authorization is terminated or revoked sooner.     Influenza A by PCR NEGATIVE NEGATIVE   Influenza B by PCR NEGATIVE NEGATIVE    Comment: (NOTE) The Xpert Xpress SARS-CoV-2/FLU/RSV assay is intended as an aid in  the diagnosis of influenza from Nasopharyngeal swab specimens and  should not be used as a sole basis for treatment. Nasal washings and  aspirates are unacceptable for Xpert Xpress SARS-CoV-2/FLU/RSV  testing.  Fact Sheet for Patients: PinkCheek.be  Fact Sheet for Healthcare Providers: GravelBags.it  This test is not yet approved or cleared by the [place] FDA and  has been authorized for detection and/or diagnosis of SARS-CoV-2 by  FDA under an Emergency Use Authorization (EUA). This EUA  will remain  in effect (meaning this test can be used) for the duration of the  Covid-19 declaration under Section 564(b)(1) of the Act, 21  U.S.C. section 360bbb-3(b)(1), unless the authorization is  terminated or revoked. Performed at [facility], [address] [place], [place] [address]     Blood Alcohol level:  Lab Results  Component Value Date   [facility] <10 02/18/2020   ETH <10 00/86/7619    Metabolic Disorder Labs:  Lab Results  Component Value Date   HGBA1C 8.8 (H) 12/05/2019   MPG 205.86 12/05/2019   MPG 197.25 05/23/2019   No results found for: PROLACTIN Lab Results  Component Value Date   CHOL 224 (H) 11/07/2017   TRIG 215 (H) 11/07/2017   HDL 53 11/07/2017   CHOLHDL 4.2 11/07/2017   VLDL 53 (H) 02/25/2016   LDLCALC 135 (H) 11/07/2017   LDLCALC 102 (H) 02/25/2016    Current  Medications: Current Facility-Administered Medications  Medication Dose Route Frequency Provider Last Rate Last Admin  . acetaminophen (TYLENOL) tablet 650 mg  650 mg Oral Q6H PRN Clapacs, John T, MD      . alum & mag hydroxide-simeth (MAALOX/MYLANTA) 200-200-20 MG/5ML suspension 30 mL  30 mL Oral Q4H PRN Clapacs, John T, MD      . ARIPiprazole (ABILIFY) tablet 15 mg  15 mg Oral QHS Salley Scarlet, MD      . aspirin EC tablet 81 mg  81 mg Oral Daily Clapacs, Madie Reno, MD   81 mg at 02/21/20 0902  . atorvastatin (LIPITOR) tablet 40 mg  40 mg Oral Daily Clapacs, Madie Reno, MD   40 mg at 02/21/20 0902  . [START ON 02/22/2020] buPROPion (WELLBUTRIN XL) 24 hr tablet 150 mg  150 mg Oral Daily Salley Scarlet, MD      . colestipol (COLESTID) tablet 1 g  1 g Oral Daily Clapacs, Madie Reno, MD   1 g at 02/21/20 0901  . ferrous sulfate tablet 325 mg  325 mg Oral Daily Clapacs, John T, MD   325 mg at 02/21/20 0901  . furosemide (LASIX) tablet 40 mg  40 mg Oral Daily Clapacs, Madie Reno, MD   40 mg at 02/21/20 0901  . gabapentin (NEURONTIN) capsule 300 mg  300 mg Oral QHS Clapacs, John T, MD   300 mg  at 02/20/20 2133  . hydrochlorothiazide (HYDRODIURIL) tablet 12.5 mg  12.5 mg Oral Daily Clapacs, Madie Reno, MD   12.5 mg at 02/21/20 0902  . insulin glargine (LANTUS) injection 24 Units  24 Units Subcutaneous Daily Clapacs, Madie Reno, MD   24 Units at 02/21/20 0900  . magnesium hydroxide (MILK OF MAGNESIA) suspension 30 mL  30 mL Oral Daily PRN Clapacs, John T, MD      . metFORMIN (GLUCOPHAGE) tablet 1,000 mg  1,000 mg Oral Q breakfast Clapacs, Madie Reno, MD   1,000 mg at 02/21/20 0902  . metoprolol succinate (TOPROL-XL) 24 hr tablet 50 mg  50 mg Oral Daily Clapacs, Madie Reno, MD   50 mg at 02/21/20 0901  . ondansetron (ZOFRAN) tablet 4 mg  4 mg Oral Q8H PRN Clapacs, John T, MD      . pantoprazole (PROTONIX) EC tablet 40 mg  40 mg Oral BID AC Clapacs, Madie Reno, MD   40 mg at 02/21/20 0901  . traZODone (DESYREL) tablet 50 mg  50 mg Oral QHS PRN Clapacs, Madie Reno, MD   50 mg at 02/20/20 2133   PTA Medications: Medications Prior to Admission  Medication Sig Dispense Refill Last Dose  . acetaminophen (TYLENOL) 325 MG tablet Take 2 tablets (650 mg total) by mouth every 6 (six) hours as needed for mild pain.     . ARIPiprazole (ABILIFY) 10 MG tablet TAKE 1 TABLET BY MOUTH AT BEDTIME. 30 tablet 0   . aspirin EC 81 MG tablet Take 1 tablet (81 mg total) by mouth daily. (Patient not taking: Reported on 02/19/2020) 90 tablet 3   . atorvastatin (LIPITOR) 40 MG tablet Take 1 tablet (40 mg total) by mouth daily. (Patient not taking: Reported on 02/19/2020) 90 tablet 3   . Blood Glucose Monitoring Suppl (ONETOUCH VERIO) w/Device KIT Use to check blood sugar up to 3 x daily 1 kit 0   . buPROPion (WELLBUTRIN XL) 300 MG 24 hr tablet TAKE 1 TABLET BY MOUTH EACH MORNING. (Patient taking differently: Take 300 mg by mouth daily. )  30 tablet 0   . colestipol (COLESTID) 1 g tablet Take 2 tablets (2 g total) by mouth 2 (two) times daily as needed (diarrhea). Take only if having diarrhea, may not need to take regularly. (Patient not  taking: Reported on 02/19/2020) 120 tablet 2   . Ferrous Sulfate 27 MG TABS Take 27 mg by mouth daily.      . furosemide (LASIX) 40 MG tablet Take 1 tablet (40 mg total) by mouth daily. 90 tablet 3   . gabapentin (NEURONTIN) 300 MG capsule Take 1 capsule (300 mg total) by mouth at bedtime. (Patient not taking: Reported on 02/19/2020) 90 capsule 3   . hydrochlorothiazide (HYDRODIURIL) 12.5 MG tablet Take 1 tablet (12.5 mg total) by mouth daily. 90 tablet 3   . insulin glargine (LANTUS) 100 UNIT/ML Solostar Pen Inject 24 Units into the skin daily. Each morning     . Lancets (ONETOUCH ULTRASOFT) lancets Use to check blood sugar up to 3 x daily 100 each 12   . metFORMIN (GLUCOPHAGE) 1000 MG tablet Take 1,000 mg by mouth daily.      . metoprolol succinate (TOPROL-XL) 50 MG 24 hr tablet Take 1 tablet (50 mg total) by mouth daily. Take with or immediately following a meal. 90 tablet 3   . ondansetron (ZOFRAN) 4 MG tablet Take 1 tablet (4 mg total) by mouth every 6 (six) hours. (Patient taking differently: Take 4 mg by mouth every 8 (eight) hours as needed for nausea or vomiting. ) 12 tablet 0   . ONETOUCH VERIO test strip Check blood sugar up to 5 x daily as needed 450 each 3   . pantoprazole (PROTONIX) 40 MG tablet Take 1 tablet (40 mg total) by mouth 2 (two) times daily before a meal. 180 tablet 3   . traZODone (DESYREL) 50 MG tablet TAKE 1 TABLET BY MOUTH AT BEDTIME AS NEEDED FOR SLEEP. (Patient not taking: Reported on 02/19/2020) 30 tablet 2   . vitamin B-12 1000 MCG tablet Take 1 tablet (1,000 mcg total) by mouth daily.       Musculoskeletal: Strength & Muscle Tone: within normal limits and decreased Gait & Station: using a wheelchair Patient leans: N/A  Psychiatric Specialty Exam: Physical Exam Vitals and nursing note reviewed.  Constitutional:      Appearance: Normal appearance.  HENT:     Head: Normocephalic and atraumatic.     Right Ear: External ear normal.     Left Ear: External ear  normal.     Nose: Nose normal.     Mouth/Throat:     Mouth: Mucous membranes are moist.     Pharynx: Oropharynx is clear.  Eyes:     Extraocular Movements: Extraocular movements intact.     Conjunctiva/sclera: Conjunctivae normal.     Pupils: Pupils are equal, round, and reactive to light.  Cardiovascular:     Rate and Rhythm: Normal rate.     Pulses: Normal pulses.  Pulmonary:     Effort: Pulmonary effort is normal.     Breath sounds: Normal breath sounds.  Abdominal:     General: Abdomen is flat.     Palpations: Abdomen is soft.  Musculoskeletal:        General: No swelling. Normal range of motion.     Cervical back: Normal range of motion and neck supple.  Skin:    General: Skin is warm and dry.  Neurological:     Mental Status: She is alert.     Motor: Weakness  present.     Gait: Gait abnormal.  Psychiatric:        Attention and Perception: She does not perceive auditory or visual hallucinations.        Mood and Affect: Mood is anxious. Affect is inappropriate.        Speech: Speech normal.        Behavior: Behavior normal.        Thought Content: Thought content is delusional.        Cognition and Memory: Cognition is impaired.        Judgment: Judgment is impulsive.     Review of Systems  Constitutional: Positive for activity change and fatigue.  HENT: Negative for rhinorrhea and sore throat.   Eyes: Negative for photophobia and visual disturbance.  Respiratory: Negative for cough and shortness of breath.   Cardiovascular: Negative for chest pain and palpitations.  Gastrointestinal: Negative for constipation, diarrhea, nausea and vomiting.  Endocrine: Negative for cold intolerance and heat intolerance.  Genitourinary: Negative for difficulty urinating and dysuria.  Musculoskeletal: Positive for arthralgias, back pain, gait problem and joint swelling.  Skin: Negative for rash and wound.  Allergic/Immunologic: Negative for environmental allergies and food  allergies.  Neurological: Negative for dizziness and syncope.  Hematological: Negative for adenopathy. Does not bruise/bleed easily.  Psychiatric/Behavioral: Positive for behavioral problems, confusion, decreased concentration and sleep disturbance. The patient is nervous/anxious and is hyperactive.     Blood pressure 116/68, pulse 91, temperature (!) 97.5 F (36.4 C), temperature source Oral, resp. rate 18, height '5\' 10"'  (1.778 m), weight 106.6 kg, SpO2 100 %.Body mass index is 33.72 kg/m.  General Appearance: Fairly Groomed and wearing a boot to left foot, sitting in wheelchair  Eye Contact:  Good  Speech:  Normal Rate  Volume:  Decreased  Mood:  Anxious  Affect:  Inappropriate  Thought Process:  Disorganized  Orientation:  Full (Time, Place, and Person)  Thought Content:  Logical  Suicidal Thoughts:  No  Homicidal Thoughts:  No  Memory:  Immediate;   Fair Recent;   Fair Remote;   Fair  Judgement:  Impaired  Insight:  Lacking  Psychomotor Activity:  Decreased  Concentration:  Concentration: Fair and Attention Span: Fair  Recall:  [employer] of Knowledge:  Fair  Language:  Fair  Akathisia:  Negative  Handed:  Right  AIMS (if indicated):     Assets:  Communication Skills Desire for Improvement Housing Social Support  ADL's:  Impaired  Cognition:  Impaired,  Moderate  Sleep:          Treatment Plan Summary: Daily contact with patient to assess and evaluate symptoms and progress in treatment, Medication management and Plan Continue inpatient admission. Restart home medications. Will increase Abilify to 15 mg daily for treatment of schizophrenia, decrease Wellbutrin to 150 mg daily with plan to discontinue given concerns for manic-like behavior. Will ask physical therapy to evaluate and treat for deconditioning.   Observation Level/Precautions:  15 minute checks  Laboratory:  lipid panel, hemoglobin a1c  Psychotherapy:    Medications:    Consultations:    Discharge  Concerns:    Estimated LOS:  Other:     Physician Treatment Plan for Primary Diagnosis: Schizophrenia ([place]) Long Term Goal(s): Improvement in symptoms so as ready for discharge  Short Term Goals: Ability to identify changes in lifestyle to reduce recurrence of condition will improve, Ability to verbalize feelings will improve, Ability to disclose and discuss suicidal ideas, Ability to demonstrate self-control will improve,  Ability to identify and develop effective coping behaviors will improve and Compliance with prescribed medications will improve  Physician Treatment Plan for Secondary Diagnosis: Principal Problem:   Schizophrenia ([place]) Active Problems:   DM (diabetes mellitus), type 2, 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[place])   Essential hypertension   Hyperlipidemia associated with type 2 diabetes mellitus ([place])  Long Term Goal(s): Improvement in symptoms so as ready for discharge  Short Term Goals: Ability to identify changes in lifestyle to reduce recurrence of condition will improve, Ability to verbalize feelings will improve, Ability to disclose and discuss suicidal ideas, Ability to demonstrate self-control will improve, Ability to identify and develop effective coping behaviors will improve and Compliance with prescribed medications will improve  I certify that inpatient services furnished can reasonably be expected to improve the patient's condition.    Salley Scarlet, MD 10/22/202111:40 AM

## 2020-02-21 NOTE — [facility] Group Notes (Signed)
LCSW Group Therapy Note  02/21/2020 2:26 PM  Type of Therapy and Topic:  Group Therapy:  Feelings around Relapse and Recovery  Participation Level:  None   Description of Group:    Patients in this group will discuss emotions they experience before and after a relapse. They will process how experiencing these feelings, or avoidance of experiencing them, relates to having a relapse. Facilitator will guide patients to explore emotions they have related to recovery. Patients will be encouraged to process which emotions are more powerful. They will be guided to discuss the emotional reaction significant others in their lives may have to their relapse or recovery. Patients will be assisted in exploring ways to respond to the emotions of others without this contributing to a relapse.  Therapeutic Goals: 1. Patient will identify two or more emotions that lead to a relapse for them 2. Patient will identify two emotions that result when they relapse 3. Patient will identify two emotions related to recovery 4. Patient will demonstrate ability to communicate their needs through discussion and/or role plays   Summary of Patient Progress: Patient was present at the beginning of group, however was called out due to PT.    Therapeutic Modalities:   Cognitive Behavioral Therapy Solution-Focused Therapy Assertiveness Training Relapse Prevention Therapy   Assunta Curtis, MSW, LCSW 02/21/2020 2:26 PM

## 2020-02-22 LAB — GLUCOSE, CAPILLARY: Glucose-Capillary: 207 mg/dL — ABNORMAL HIGH (ref 70–99)

## 2020-02-22 NOTE — [facility] Group Notes (Signed)
[place] Group Notes:  (Nursing/MHT/Case Management/Adjunct)  Date:  02/22/2020  Time:  9:49 PM  Type of Therapy:  Group Therapy  Participation Level:  Did Not Attend  [name] 02/22/2020, 9:49 PM

## 2020-02-22 NOTE — Progress Notes (Signed)
Patient has now started utilizing the front-wheel walker that PT brought down for her yesterday.

## 2020-02-22 NOTE — BHH Counselor (Signed)
Adult Comprehensive Assessment  Patient ID: [name], female   DOB: 02-Aug-1965, 54 y.o.   MRN: [id number]  Information Source: Information source: Patient  Current Stressors:  Patient states their primary concerns and needs for treatment are:: "I hadn't been taking my medications  adn making erratic decisions" Patient states their goals for this hospitilization and ongoing recovery are:: "get my life in order" Educational / Learning stressors: Pt denies. Employment / Job issues: Pt denies. Family Relationships: "for 2 months now my family has not been talking to mePublishing copy / Lack of resources (include bankruptcy): "been giving my money away" Housing / Lack of housing: Pt denies. Physical health (include injuries & life threatening diseases): "High blood pressure, brace on my leg and diabetes" Social relationships: Pt denies. Substance abuse: Pt denies. Bereavement / Loss: Pt denies.  Living/Environment/Situation:  Living Arrangements: Alone How long has patient lived in current situation?: "4 months" What is atmosphere in current home: Comfortable, Loving, Supportive  Family History:  Marital status: Divorced Divorced, when?: "47 years" Does patient have children?: Yes How many children?: 2 How is patient's relationship with their children?: "really good until recently"  Childhood History:  By whom was/is the patient raised?: Both parents Additional childhood history information: Pt reports that she was living with her mother, however, mother was placed in a home for Alzheimer's. Description of patient's relationship with caregiver when they were a child: "good" Patient's description of current relationship with people who raised him/her: "my mom is in a home for Alzheimer's and my dad passed away from Alzheimer's 6 years ago" How were you disciplined when you got in trouble as a child/adolescent?: "spanking" Does patient have siblings?: Yes Number of Siblings:  2 Description of patient's current relationship with siblings: "get along with my sister very well but with my brother he is a Social research officer, government so it's OK" Did patient suffer any verbal/emotional/physical/sexual abuse as a child?: No Did patient suffer from severe childhood neglect?: No Has patient ever been sexually abused/assaulted/raped as an adolescent or adult?: No Was the patient ever a victim of a crime or a disaster?: No Witnessed domestic violence?: No Has patient been affected by domestic violence as an adult?: No  Education:  Highest grade of school patient has completed: "some college" Currently a Ship broker?: No Learning disability?: No  Employment/Work Situation:   Employment situation: On disability Why is patient on disability: "mental health" How long has patient been on disability: "7-8 years" What is the longest time patient has a held a job?: "10 years" 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 If attempted suicide, did drugs/alcohol play a role in this?: No Alcohol/Substance Abuse Treatment Hx: Denies past history Has alcohol/substance abuse ever caused legal problems?: No  Social Support System:   Patient's Community Support System: Good Describe Community Support System: "sister, daughter" Type of faith/religion: "Christian" How does patient's faith help to cope with current illness?: "listen to preachers"  Leisure/Recreation:   Do You Have Hobbies?: Yes Leisure and Hobbies: "make baskets"  Strengths/Needs:   What is the patient's perception of their strengths?: "get along with people" Patient states these barriers may affect/interfere with their  treatment: Pt denies. Patient states these barriers may  affect their return to the community: Pt denies.  Discharge Plan:   Currently receiving community mental health services: No Patient states concerns and preferences for aftercare planning are: Pt reports plans to begin aftercare. Patient states they will know when they are safe and ready for discharge when: "When I get my thinking straight" Does patient have access to transportation?: No Does patient have financial barriers related to discharge medications?: No Plan for no access to transportation at discharge: CSW will assist with transportation needs. Will patient be returning to same living situation after discharge?: Yes  Summary/Recommendations:   Summary and Recommendations (to be completed by the evaluator): Patient is a 54 year old female from [place], [place] ([place]   She presents to the hospital following concerns from her family that she has stopped taking her medications and is not taking care of herself.  She has a primary diagnosis of Schizophrenia.  Recommendations include: crisis stabilization, therapeutic milieu, encourage group attendance and participation, medication management for detox/mood stabilization and development of comprehensive mental wellness/sobriety plan.  Rozann Lesches. 02/22/2020

## 2020-02-22 NOTE — Progress Notes (Addendum)
[facility] MD Progress Note  02/22/2020 8:40 AM [name]  MRN:  [id number] Principal Problem: Schizophrenia ([place]) Diagnosis:  Schizoaffective disorder, bipolar type Medication non compliance Diabetes mellitus type 2 Hypertension Neuropathy  Total Time spent with patient: 30 minutes  HPI: Patient is being seen by this provider for the first time.  [name] is a 54 year old female.  Records and chart reviewed.  Patient was involuntary committed by her daughter due to severe decline in in functioning, including the house covered in feces and overall dilapidated living conditions.  She has a history of schizoaffective disorder, bipolar type.  Patient has been off of her medications in July 2021.  Patient openly admits that she has been engaging in risky behaviors, "I have been hog wild" for the past 6 months.  She has been giving money away to people that she meets online.  She is also had 3 random sexual encounters.  "That is not my character.  None of this is like me." She denies subjective symptoms of mania, however.  Indicates that the medications keep her from becoming impulsive.  Predominant mood over the past week has been pretty good, but she had a lot of anxiety last week.  Family had indicated that patient had had some suicidal comments, but the patient vehemently denies this.  Yesterday, her Abilify was increased to 15 mg and her Wellbutrin was decreased to 150 mg. The past few weeks, she denies auditory and visual hallucinations but admits that she used to have these issues.  She slept good last night.  At home she does not sleep well.  "I feel good today."  Denies homicidal suicidal thoughts.  Denies having medical issues today.   Past Medical History:  Past Medical History:  Diagnosis Date  . Charcot's joint of foot, left   . Depression   . Diabetes mellitus without complication ([place])   . Hyperlipidemia   . Hypertension   . Neuromuscular disorder ([place])   . Schizo  affective schizophrenia ([place])    abilify and pazil    Past Surgical History:  Procedure Laterality Date  . CHOLECYSTECTOMY    . COLONOSCOPY WITH PROPOFOL N/A 05/26/2019   Procedure: COLONOSCOPY WITH PROPOFOL;  Surgeon: Toledo, Benay Pike, MD;  Location: [facility] ENDOSCOPY;  Service: Gastroenterology;  Laterality: N/A;  . ESOPHAGOGASTRODUODENOSCOPY (EGD) WITH PROPOFOL N/A 05/26/2019   Procedure: ESOPHAGOGASTRODUODENOSCOPY (EGD) WITH PROPOFOL;  Surgeon: Toledo, Benay Pike, MD;  Location: [facility] ENDOSCOPY;  Service: Gastroenterology;  Laterality: N/A;  . IRRIGATION AND DEBRIDEMENT FOOT Right 02/26/2016   Procedure: RIGHT 2ND TOE DEBRIDEMENT;  Surgeon: Samara Deist, DPM;  Location: [facility] ORS;  Service: Podiatry;  Laterality: Right;  . IRRIGATION AND DEBRIDEMENT FOOT Left 12/13/2018   Procedure: IRRIGATION AND DEBRIDEMENT FOOT;  Surgeon: Caroline More, DPM;  Location: [facility] ORS;  Service: Podiatry;  Laterality: Left;   Family History:  Family History  Problem Relation Age of Onset  . Breast cancer Cousin        maternal cousin  . Hypertension Mother   . Hyperthyroidism Mother   . Dementia Mother   . Prostate cancer Father   . Diabetes Maternal Grandmother   . Hypertension Maternal Grandmother   . Cancer Maternal Grandmother        unknown type  . Diabetes Maternal Grandfather   . Hypertension Maternal Grandfather   . Diabetes Paternal Grandmother   . Hypertension Paternal Grandmother   . Diabetes Paternal Grandfather   . Hypertension Paternal Grandfather    Social History:  Social  History   Substance and Sexual Activity  Alcohol Use No     Social History   Substance and Sexual Activity  Drug Use No    Social History   Socioeconomic History  . Marital status: Divorced    Spouse name: Not on file  . Number of children: 2  . Years of education: Not on file  . Highest education level: Not on file  Occupational History  . Not on file  Tobacco Use  . Smoking status: Former Smoker     Packs/day: 3.00    Years: 30.00    Pack years: 90.00    Types: Cigarettes    Quit date: 05/02/2009    Years since quitting: 10.8  . Smokeless tobacco: Never Used  . Tobacco comment: Pt reported  quitting in 2011  Vaping Use  . Vaping Use: Never used  Substance and Sexual Activity  . Alcohol use: No  . Drug use: No  . Sexual activity: Not Currently  Other Topics Concern  . Not on file  Social History Narrative   At home with mom, 41. Lives in brothers house who moved out. Sister comes by every other day to check on patient and mom.   Social Determinants of Health   Financial Resource Strain: Low Risk   . Difficulty of Paying Living Expenses: Not hard at all  Food Insecurity: No Food Insecurity  . Worried About Charity fundraiser in the Last Year: Never true  . Ran Out of Food in the Last Year: Never true  Transportation Needs: No Transportation Needs  . Lack of Transportation (Medical): No  . Lack of Transportation (Non-Medical): No  Physical Activity: Inactive  . Days of Exercise per Week: 0 days  . Minutes of Exercise per Session: 0 min  Stress: No Stress Concern Present  . Feeling of Stress : Only a little  Social Connections: Moderately Isolated  . Frequency of Communication with Friends and Family: More than three times a week  . Frequency of Social Gatherings with Friends and Family: More than three times a week  . Attends Religious Services: More than 4 times per year  . Active Member of Clubs or Organizations: No  . Attends Archivist Meetings: Never  . Marital Status: Divorced   Additional Social History:                         Sleep: Good  Appetite:  Fair  Current Medications: Current Facility-Administered Medications  Medication Dose Route Frequency Provider Last Rate Last Admin  . acetaminophen (TYLENOL) tablet 650 mg  650 mg Oral Q6H PRN Clapacs, John T, MD      . alum & mag hydroxide-simeth (MAALOX/MYLANTA) 200-200-20 MG/5ML  suspension 30 mL  30 mL Oral Q4H PRN Clapacs, John T, MD      . ARIPiprazole (ABILIFY) tablet 15 mg  15 mg Oral QHS Salley Scarlet, MD   15 mg at 02/21/20 2133  . aspirin EC tablet 81 mg  81 mg Oral Daily Clapacs, John T, MD   81 mg at 02/22/20 0820  . atorvastatin (LIPITOR) tablet 40 mg  40 mg Oral Daily Clapacs, Madie Reno, MD   40 mg at 02/22/20 4098  . buPROPion (WELLBUTRIN XL) 24 hr tablet 150 mg  150 mg Oral Daily Salley Scarlet, MD   150 mg at 02/22/20 1191  . colestipol (COLESTID) tablet 1 g  1 g Oral Daily Clapacs, John T,  MD   1 g at 02/22/20 2831  . ferrous sulfate tablet 325 mg  325 mg Oral Daily Clapacs, John T, MD   325 mg at 02/22/20 0820  . furosemide (LASIX) tablet 40 mg  40 mg Oral Daily Clapacs, Madie Reno, MD   40 mg at 02/22/20 5176  . gabapentin (NEURONTIN) capsule 300 mg  300 mg Oral QHS Clapacs, John T, MD   300 mg at 02/21/20 2133  . hydrochlorothiazide (HYDRODIURIL) tablet 12.5 mg  12.5 mg Oral Daily Clapacs, Madie Reno, MD   12.5 mg at 02/22/20 1607  . insulin glargine (LANTUS) injection 24 Units  24 Units Subcutaneous Daily Clapacs, Madie Reno, MD   24 Units at 02/22/20 [phone]  . magnesium hydroxide (MILK OF MAGNESIA) suspension 30 mL  30 mL Oral Daily PRN Clapacs, John T, MD      . metFORMIN (GLUCOPHAGE) tablet 1,000 mg  1,000 mg Oral Q breakfast Clapacs, John T, MD   1,000 mg at 02/22/20 0820  . metoprolol succinate (TOPROL-XL) 24 hr tablet 50 mg  50 mg Oral Daily Clapacs, Madie Reno, MD   50 mg at 02/22/20 0820  . ondansetron (ZOFRAN) tablet 4 mg  4 mg Oral Q8H PRN Clapacs, John T, MD      . pantoprazole (PROTONIX) EC tablet 40 mg  40 mg Oral BID AC Clapacs, Madie Reno, MD   40 mg at 02/22/20 0821  . traZODone (DESYREL) tablet 50 mg  50 mg Oral QHS PRN Clapacs, Madie Reno, MD   50 mg at 02/20/20 2133    Lab Results:  Results for orders placed or performed during the hospital encounter of 02/20/20 (from the past 48 hour(s))  Glucose, capillary     Status: Abnormal   Collection Time:  02/20/20  8:32 PM  Result Value Ref Range   Glucose-Capillary 140 (H) 70 - 99 mg/dL    Comment: Glucose reference range applies only to samples taken after fasting for at least 8 hours.   Comment 1 Notify RN   Glucose, capillary     Status: Abnormal   Collection Time: 02/21/20  6:55 AM  Result Value Ref Range   Glucose-Capillary 158 (H) 70 - 99 mg/dL    Comment: Glucose reference range applies only to samples taken after fasting for at least 8 hours.   Comment 1 Notify RN   Respiratory Panel by RT PCR (Flu A&B, Covid) - Nasopharyngeal Swab     Status: None   Collection Time: 02/21/20 10:37 AM   Specimen: Nasopharyngeal Swab  Result Value Ref Range   SARS Coronavirus 2 by RT PCR NEGATIVE NEGATIVE    Comment: (NOTE) SARS-CoV-2 target nucleic acids are NOT DETECTED.  The SARS-CoV-2 RNA is generally detectable in upper respiratoy specimens during the acute phase of infection. The lowest concentration of SARS-CoV-2 viral copies this assay can detect is 131 copies/mL. A negative result does not preclude SARS-Cov-2 infection and should not be used as the sole basis for treatment or other patient management decisions. A negative result may occur with  improper specimen collection/handling, submission of specimen other than nasopharyngeal swab, presence of viral mutation(s) within the areas targeted by this assay, and inadequate number of viral copies (<131 copies/mL). A negative result must be combined with clinical observations, patient history, and epidemiological information. The expected result is Negative.  Fact Sheet for Patients:  PinkCheek.be  Fact Sheet for Healthcare Providers:  GravelBags.it  This test is no t yet approved or cleared by the  [place] Architectural technologist and  has been authorized for detection and/or diagnosis of SARS-CoV-2 by FDA under an Print production planner (EUA). This EUA will remain  in effect (meaning  this test can be used) for the duration of the COVID-19 declaration under Section 564(b)(1) of the Act, 21 U.S.C. section 360bbb-3(b)(1), unless the authorization is terminated or revoked sooner.     Influenza A by PCR NEGATIVE NEGATIVE   Influenza B by PCR NEGATIVE NEGATIVE    Comment: (NOTE) The Xpert Xpress SARS-CoV-2/FLU/RSV assay is intended as an aid in  the diagnosis of influenza from Nasopharyngeal swab specimens and  should not be used as a sole basis for treatment. Nasal washings and  aspirates are unacceptable for Xpert Xpress SARS-CoV-2/FLU/RSV  testing.  Fact Sheet for Patients: PinkCheek.be  Fact Sheet for Healthcare Providers: GravelBags.it  This test is not yet approved or cleared by the [place] FDA and  has been authorized for detection and/or diagnosis of SARS-CoV-2 by  FDA under an Emergency Use Authorization (EUA). This EUA will remain  in effect (meaning this test can be used) for the duration of the  Covid-19 declaration under Section 564(b)(1) of the Act, 21  U.S.C. section 360bbb-3(b)(1), unless the authorization is  terminated or revoked. Performed at [facility], [address] [place], [place] [address]   Glucose, capillary     Status: Abnormal   Collection Time: 02/22/20  6:53 AM  Result Value Ref Range   Glucose-Capillary 207 (H) 70 - 99 mg/dL    Comment: Glucose reference range applies only to samples taken after fasting for at least 8 hours.   Comment 1 Notify RN     Blood Alcohol level:  Lab Results  Component Value Date   ETH <10 02/18/2020   ETH <10 78/67/6720    Metabolic Disorder Labs: Lab Results  Component Value Date   HGBA1C 8.8 (H) 12/05/2019   MPG 205.86 12/05/2019   MPG 197.25 05/23/2019   No results found for: PROLACTIN Lab Results  Component Value Date   CHOL 224 (H) 11/07/2017   TRIG 215 (H) 11/07/2017   HDL 53 11/07/2017   CHOLHDL 4.2  11/07/2017   VLDL 53 (H) 02/25/2016   LDLCALC 135 (H) 11/07/2017   LDLCALC 102 (H) 02/25/2016    Physical Findings: AIMS:  , ,  ,  ,    CIWA:    COWS:     Psychiatric Specialty Exam: Physical Exam Vitalsand nursing notereviewed.  Constitutional:  Appearance: Normal appearance.  HENT:  Head: Normocephalicand atraumatic.  Right Ear: External earnormal.  Left Ear: External earnormal.  Nose: Nose normal.  Mouth/Throat:  Mouth: Mucous membranes are moist.  Pharynx: Oropharynx is clear.  Eyes:  Extraocular Movements: Extraocular movements intact.  Conjunctiva/sclera: Conjunctivae normal.  Pupils: Pupils are equal, round, and reactive to light.  Cardiovascular:  Rate and Rhythm: Normal rate.  Pulses: Normal pulses.  Pulmonary:  Effort: Pulmonary effort is normal.  Breath sounds: Normal breath sounds.  Abdominal:  General: Abdomen is flat.  Palpations: Abdomen is soft.  Musculoskeletal:  General: No swelling.Normal range of motion.  Cervical back: Normal range of motionand neck supple.  Skin: General: Skin is warmand dry.  Neurological:  Mental Status: She is alert.  Motor: [place].  Gait: Gait abnormal.  Psychiatric:  Attention and Perception: She does not perceive auditoryor visualhallucinations.  Mood and Affect: Mood is good. Affect appropriate.  Speech: Speechnormal.  Behavior: Behaviornormal.  Thought Content: Thought content is delusional.  Cognition and  Memory: Cognition is impaired.  Judgment: Judgment is impulsive.    Review of Systems  Constitutional: negativeforactivity changeand fatigue.  HENT: Negative forrhinorrheaand sore throat.  Eyes: Negative forphotophobiaand visual disturbance.  Respiratory: Negative forcoughand shortness of breath.  Cardiovascular: Negative forchest painand palpitations.   Gastrointestinal: Negative forconstipation,diarrhea,nauseaand vomiting.  Endocrine: Negative forcold intoleranceand heat intolerance.  Genitourinary: Negative fordifficulty urinatingand dysuria.  Musculoskeletal: feet pain Skin: Negative forrashand wound.  Allergic/Immunologic: Negative forenvironmental allergiesand food allergies.  Neurological: Negative fordizzinessand syncope.  Hematological: Negative foradenopathy.Does not bruise/bleed easily.  P      General Appearance:poorGroomed andwearing a boot to left foot, sitting in wheelchair  Eye Contact:Good  Speech:Normal Rate  Volume:Decreased  Mood:Good  Affect:neutral  Thought Process:organized  Orientation:Full (Time, Place, and Person)  Thought Content:Logical  Suicidal Thoughts:No  Homicidal Thoughts:No  Memory:Immediate;Fair Recent;Fair Remote;Fair  Judgement:Impaired  Insight:Lacking  Psychomotor Activity:Decreased  Concentration:Concentration:Fairand Attention Span: [place]  Akathisia:Negative  Handed:Right  AIMS (if indicated):   Assets:Communication Skills Desire for Improvement Housing Social Support  ADL's:Impaired  Cognition:Impaired,Moderate  Sleep:        Treatment Plan Summary: Daily contact with patient to assess and evaluate symptoms and progress in treatment, Medication management and Plan Continue inpatient admission. Restart home medications. Will increase Abilify to 15 mg daily for treatment of schizophrenia, decrease Wellbutrin to 150 mg daily with plan to discontinue given concerns for manic-like behavior. Will ask physical therapy to evaluate and treat for deconditioning.   02/22/20 Obtain lipid panel tomorrow Mood: Continue Abilify 15 mg.  Neuropathy: Continue gabapentin 300 mg nightly Diabetes: Metformin 1000 mg daily.  Blood glucose today is 207 this  morning.  Continue monitor.  Hemoglobin A1c is 8.8 as of December 05, 2019 Hypertension: Continue hydrochlorothiazide 12.5 mg Insomnia: Continue trazodone 50 mg Antipsychotic monitoring: No EPS NMS.    Rulon Sera, MD 02/22/2020, 8:40 AM

## 2020-02-22 NOTE — BHH Suicide Risk Assessment (Addendum)
[facility] INPATIENT:  Family/Significant Other Suicide Prevention Education  Suicide Prevention Education:  Contact Attempts: [name], daughter, [phone] has been identified by the patient as the family member/significant other with whom the patient will be residing, and identified as the person(s) who will aid the patient in the event of a mental health crisis.  With written consent from the patient, two attempts were made to provide suicide prevention education, prior to and/or following the patient's discharge.  We were unsuccessful in providing suicide prevention education.  A suicide education pamphlet was given to the patient to share with family/significant other.  Date and time of first attempt: 02/22/2020 at 10:50AM Date and time of second attempt: 02/23/2020 at 11:09 AM   Line did ring but went straight to voicemail and said that the voicemail box had not been set up,  Rozann Lesches 02/22/2020, 10:50 AM

## 2020-02-22 NOTE — BHH Group Notes (Signed)
Patient was present in [place] group.   Assunta Curtis, MSW, LCSW 02/22/2020 2:28 PM

## 2020-02-22 NOTE — Plan of Care (Signed)
D- Patient alert and oriented. Patient presents in a pleasant mood on assessment stating that she slept "more than I have in my life" last night and had no complaints to voice to this Probation officer. Patient denies anxiety, but stated that she has slight depression, reporting that "I'm mixed up with some things, it's hard to explain". Patient also denies SI, HI, AVH, and pain at this time. Patient's goal for today is to "focus".  A- Scheduled medications administered to patient, per MD orders. Support and encouragement provided.  Routine safety checks conducted every 15 minutes.  Patient informed to notify staff with problems or concerns.  R- No adverse drug reactions noted. Patient contracts for safety at this time. Patient compliant with medications and treatment plan. Patient receptive, calm, and cooperative. Patient interacts well with others on the unit.  Patient remains safe at this time.  Problem: Education: Goal: Knowledge of [facility] Education information/materials will improve Outcome: Progressing Goal: Mental status will improve Outcome: Progressing Goal: Verbalization of understanding the information provided will improve Outcome: Progressing   Problem: Health Behavior/Discharge Planning: Goal: Identification of resources available to assist in meeting health care needs will improve Outcome: Progressing Goal: Compliance with treatment plan for underlying cause of condition will improve Outcome: Progressing   Problem: Activity: Goal: Will identify at least one activity in which they can participate Outcome: Progressing   Problem: Coping: Goal: Ability to identify and develop effective coping behavior will improve Outcome: Progressing Goal: Ability to interact with others will improve Outcome: Progressing Goal: Demonstration of participation in decision-making regarding own care will improve Outcome: Progressing Goal: Ability to use eye contact when communicating with  others will improve Outcome: Progressing   Problem: Self-Concept: Goal: Will verbalize positive feelings about self Outcome: Progressing

## 2020-02-22 NOTE — [facility] Group Notes (Signed)
LCSW Group Therapy Note  02/22/2020  1:00 PM   Type of Therapy and Topic:  Group Therapy:  Trust and Honesty   Participation Level:  Did Not Attend   Description of Group:    In this group patients will be asked to explore the value of being honest.  Patients will be guided to discuss their thoughts, feelings, and behaviors related to honesty and trusting in others. Patients will process together how trust and honesty relate to forming relationships with peers, family members, and self. Each patient will be challenged to identify and express feelings of being vulnerable. Patients will discuss reasons why people are dishonest and identify alternative outcomes if one was truthful (to self or others). This group will be process-oriented, with patients participating in exploration of their own experiences, giving and receiving support, and processing challenge from other group members.   Therapeutic Goals: 1. Patient will identify why honesty is important to relationships and how honesty overall affects relationships.  2. Patient will identify a situation where they lied or were lied too and the  feelings, thought process, and behaviors surrounding the situation 3. Patient will identify the meaning of being vulnerable, how that feels, and how that correlates to being honest with self and others. 4. Patient will identify situations where they could have told the truth, but instead lied and explain reasons of dishonesty.   Summary of Patient Progress:  X    Therapeutic Modalities:   Cognitive Behavioral Therapy Solution Focused Therapy Motivational Interviewing Brief Therapy  Kemaria Dedic, MSW, LCSW 02/22/2020 10:57 AM  

## 2020-02-22 NOTE — Progress Notes (Signed)
Patient is alert and oriented x 4, affect is flat but she brightens upon approach, she appears less anxious, interacting appropriately with peers and staff, speech is soft but tangential her thoughts are organized  she was offered emotional support and encouragement. Patient denies SI/HI/AVH she was complaint with medication regimen, no distress noted will continue to monitor closely.

## 2020-02-23 LAB — LIPID PANEL
Cholesterol: 175 mg/dL (ref 0–200)
HDL: 42 mg/dL (ref >40)
LDL Cholesterol: 78 mg/dL (ref 0–99)
Total CHOL/HDL Ratio: 4.2 RATIO
Triglycerides: 275 mg/dL — ABNORMAL HIGH (ref <150)
VLDL: 55 mg/dL — ABNORMAL HIGH (ref 0–40)

## 2020-02-23 LAB — GLUCOSE, CAPILLARY: Glucose-Capillary: 175 mg/dL — ABNORMAL HIGH (ref 70–99)

## 2020-02-23 NOTE — Plan of Care (Signed)
  Problem: Education: Goal: Knowledge of [facility] Education information/materials will improve Outcome: Progressing Goal: Mental status will improve Outcome: Progressing Goal: Verbalization of understanding the information provided will improve Outcome: Progressing   Problem: Health Behavior/Discharge Planning: Goal: Identification of resources available to assist in meeting health care needs will improve Outcome: Progressing Goal: Compliance with treatment plan for underlying cause of condition will improve Outcome: Progressing   Problem: Activity: Goal: Will identify at least one activity in which they can participate Outcome: Progressing   Problem: Coping: Goal: Ability to identify and develop effective coping behavior will improve Outcome: Progressing Goal: Ability to interact with others will improve Outcome: Progressing Goal: Demonstration of participation in decision-making regarding own care will improve Outcome: Progressing Goal: Ability to use eye contact when communicating with others will improve Outcome: Progressing   Problem: Self-Concept: Goal: Will verbalize positive feelings about self Outcome: Progressing

## 2020-02-23 NOTE — Progress Notes (Signed)
[facility] MD Progress Note  02/23/2020 9:55 AM [name]  MRN:  [id number] Principal Problem: Schizophrenia ([place]od[place]ia) Diagnosis:  Schizoaffective disorder, bipolar type Medication non compliance Diabetes mellitus type 2 Hypertension Neuropathy  HPI:  10/24 Patient reports feeling a little down and depressed.  Explains that she is ruminating about the online risk activities that she had engaged in.  Says that she does not necessarily feel guilty but just does not know why she did something like that.  Ports blurred vision for the past 1 to 2 hours.  We reviewed that this will be monitored.  She did sleep well.  Appetite is at her baseline.  Reports mild neck pain.  She is more isolative today, says that she just does not feel like doing anything.  Denies having any safety concerns today.  10/23 Patient is being seen by this provider for the first time.  [name] is a 54 year old female.  Records and chart reviewed.  Patient was involuntary committed by her daughter due to severe decline in in functioning, including the house covered in feces and overall dilapidated living conditions.  She has a history of schizoaffective disorder, bipolar type.  Patient has been off of her medications in July 2021.  Patient openly admits that she has been engaging in risky behaviors, "I have been hog wild" for the past 6 months.  She has been giving money away to people that she meets online.  She is also had 3 random sexual encounters.  "That is not my character.  None of this is like me." She denies subjective symptoms of mania, however.  Indicates that the medications keep her from becoming impulsive.  Predominant mood over the past week has been pretty good, but she had a lot of anxiety last week.  Family had indicated that patient had had some suicidal comments, but the patient vehemently denies this.  Yesterday, her Abilify was increased to 15 mg and her Wellbutrin was decreased to 150 mg. The past few  weeks, she denies auditory and visual hallucinations but admits that she used to have these issues.  She slept good last night.  At home she does not sleep well.  "I feel good today."  Denies homicidal suicidal thoughts.  Denies having medical issues today.   Past Medical History:  Past Medical History:  Diagnosis Date  . Charcot's joint of foot, left   . Depression   . Diabetes mellitus without complication ([place])   . Hyperlipidemia   . Hypertension   . Neuromuscular disorder ([place])   . Schizo affective schizophrenia ([place])    abilify and pazil    Past Surgical History:  Procedure Laterality Date  . CHOLECYSTECTOMY    . COLONOSCOPY WITH PROPOFOL N/A 05/26/2019   Procedure: COLONOSCOPY WITH PROPOFOL;  Surgeon: Toledo, Benay Pike, MD;  Location: [facility] ENDOSCOPY;  Service: Gastroenterology;  Laterality: N/A;  . ESOPHAGOGASTRODUODENOSCOPY (EGD) WITH PROPOFOL N/A 05/26/2019   Procedure: ESOPHAGOGASTRODUODENOSCOPY (EGD) WITH PROPOFOL;  Surgeon: Toledo, Benay Pike, MD;  Location: [facility] ENDOSCOPY;  Service: Gastroenterology;  Laterality: N/A;  . IRRIGATION AND DEBRIDEMENT FOOT Right 02/26/2016   Procedure: RIGHT 2ND TOE DEBRIDEMENT;  Surgeon: Samara Deist, DPM;  Location: [facility] ORS;  Service: Podiatry;  Laterality: Right;  . IRRIGATION AND DEBRIDEMENT FOOT Left 12/13/2018   Procedure: IRRIGATION AND DEBRIDEMENT FOOT;  Surgeon: Caroline More, DPM;  Location: [facility] ORS;  Service: Podiatry;  Laterality: Left;   Family History:  Family History  Problem Relation Age of Onset  . Breast cancer Cousin  maternal cousin  . Hypertension Mother   . Hyperthyroidism Mother   . Dementia Mother   . Prostate cancer Father   . Diabetes Maternal Grandmother   . Hypertension Maternal Grandmother   . Cancer Maternal Grandmother        unknown type  . Diabetes Maternal Grandfather   . Hypertension Maternal Grandfather   . Diabetes Paternal Grandmother   . Hypertension Paternal Grandmother   . Diabetes  Paternal Grandfather   . Hypertension Paternal Grandfather    Social History:  Social History   Substance and Sexual Activity  Alcohol Use No     Social History   Substance and Sexual Activity  Drug Use No    Social History   Socioeconomic History  . Marital status: Divorced    Spouse name: Not on file  . Number of children: 2  . Years of education: Not on file  . Highest education level: Not on file  Occupational History  . Not on file  Tobacco Use  . Smoking status: Former Smoker    Packs/day: 3.00    Years: 30.00    Pack years: 90.00    Types: Cigarettes    Quit date: 05/02/2009    Years since quitting: 10.8  . Smokeless tobacco: Never Used  . Tobacco comment: Pt reported  quitting in 2011  Vaping Use  . Vaping Use: Never used  Substance and Sexual Activity  . Alcohol use: No  . Drug use: No  . Sexual activity: Not Currently  Other Topics Concern  . Not on file  Social History Narrative   At home with mom, 27. Lives in brothers house who moved out. Sister comes by every other day to check on patient and mom.   Social Determinants of Health   Financial Resource Strain: Low Risk   . Difficulty of Paying Living Expenses: Not hard at all  Food Insecurity: No Food Insecurity  . Worried About Charity fundraiser in the Last Year: Never true  . Ran Out of Food in the Last Year: Never true  Transportation Needs: No Transportation Needs  . Lack of Transportation (Medical): No  . Lack of Transportation (Non-Medical): No  Physical Activity: Inactive  . Days of Exercise per Week: 0 days  . Minutes of Exercise per Session: 0 min  Stress: No Stress Concern Present  . Feeling of Stress : Only a little  Social Connections: Moderately Isolated  . Frequency of Communication with Friends and Family: More than three times a week  . Frequency of Social Gatherings with Friends and Family: More than three times a week  . Attends Religious Services: More than 4 times per year   . Active Member of Clubs or Organizations: No  . Attends Archivist Meetings: Never  . Marital Status: Divorced   Additional Social History:                         Sleep: Good  Appetite:  Fair  Current Medications: Current Facility-Administered Medications  Medication Dose Route Frequency Provider Last Rate Last Admin  . acetaminophen (TYLENOL) tablet 650 mg  650 mg Oral Q6H PRN Clapacs, John T, MD   650 mg at 02/22/20 1610  . alum & mag hydroxide-simeth (MAALOX/MYLANTA) 200-200-20 MG/5ML suspension 30 mL  30 mL Oral Q4H PRN Clapacs, John T, MD      . ARIPiprazole (ABILIFY) tablet 15 mg  15 mg Oral QHS Salley Scarlet, MD  15 mg at 02/22/20 2137  . aspirin EC tablet 81 mg  81 mg Oral Daily Clapacs, Madie Reno, MD   81 mg at 02/23/20 0843  . atorvastatin (LIPITOR) tablet 40 mg  40 mg Oral Daily Clapacs, Madie Reno, MD   40 mg at 02/23/20 0843  . buPROPion (WELLBUTRIN XL) 24 hr tablet 150 mg  150 mg Oral Daily Salley Scarlet, MD   150 mg at 02/23/20 0843  . colestipol (COLESTID) tablet 1 g  1 g Oral Daily Clapacs, Madie Reno, MD   1 g at 02/23/20 0843  . ferrous sulfate tablet 325 mg  325 mg Oral Daily Clapacs, Madie Reno, MD   325 mg at 02/23/20 0843  . furosemide (LASIX) tablet 40 mg  40 mg Oral Daily Clapacs, Madie Reno, MD   40 mg at 02/23/20 0843  . gabapentin (NEURONTIN) capsule 300 mg  300 mg Oral QHS Clapacs, John T, MD   300 mg at 02/22/20 2137  . hydrochlorothiazide (HYDRODIURIL) tablet 12.5 mg  12.5 mg Oral Daily Clapacs, John T, MD   12.5 mg at 02/23/20 0840  . insulin glargine (LANTUS) injection 24 Units  24 Units Subcutaneous Daily Clapacs, Madie Reno, MD   24 Units at 02/23/20 0840  . magnesium hydroxide (MILK OF MAGNESIA) suspension 30 mL  30 mL Oral Daily PRN Clapacs, John T, MD      . metFORMIN (GLUCOPHAGE) tablet 1,000 mg  1,000 mg Oral Q breakfast Clapacs, John T, MD   1,000 mg at 02/23/20 0840  . metoprolol succinate (TOPROL-XL) 24 hr tablet 50 mg  50 mg Oral Daily  Clapacs, Madie Reno, MD   50 mg at 02/23/20 0840  . ondansetron (ZOFRAN) tablet 4 mg  4 mg Oral Q8H PRN Clapacs, John T, MD      . pantoprazole (PROTONIX) EC tablet 40 mg  40 mg Oral BID AC Clapacs, Madie Reno, MD   40 mg at 02/23/20 0843  . traZODone (DESYREL) tablet 50 mg  50 mg Oral QHS PRN Clapacs, Madie Reno, MD   50 mg at 02/20/20 2133    Lab Results:  Results for orders placed or performed during the hospital encounter of 02/20/20 (from the past 48 hour(s))  Respiratory Panel by RT PCR (Flu A&B, Covid) - Nasopharyngeal Swab     Status: None   Collection Time: 02/21/20 10:37 AM   Specimen: Nasopharyngeal Swab  Result Value Ref Range   SARS Coronavirus 2 by RT PCR NEGATIVE NEGATIVE    Comment: (NOTE) SARS-CoV-2 target nucleic acids are NOT DETECTED.  The SARS-CoV-2 RNA is generally detectable in upper respiratoy specimens during the acute phase of infection. The lowest concentration of SARS-CoV-2 viral copies this assay can detect is 131 copies/mL. A negative result does not preclude SARS-Cov-2 infection and should not be used as the sole basis for treatment or other patient management decisions. A negative result may occur with  improper specimen collection/handling, submission of specimen other than nasopharyngeal swab, presence of viral mutation(s) within the areas targeted by this assay, and inadequate number of viral copies (<131 copies/mL). A negative result must be combined with clinical observations, patient history, and epidemiological information. The expected result is Negative.  Fact Sheet for Patients:  PinkCheek.be  Fact Sheet for Healthcare Providers:  GravelBags.it  This test is no t yet approved or cleared by the [place] FDA and  has been authorized for detection and/or diagnosis of SARS-CoV-2 by FDA under an Emergency Use Authorization (EUA).  This EUA will remain  in effect (meaning this test can be used)  for the duration of the COVID-19 declaration under Section 564(b)(1) of the Act, 21 U.S.C. section 360bbb-3(b)(1), unless the authorization is terminated or revoked sooner.     Influenza A by PCR NEGATIVE NEGATIVE   Influenza B by PCR NEGATIVE NEGATIVE    Comment: (NOTE) The Xpert Xpress SARS-CoV-2/FLU/RSV assay is intended as an aid in  the diagnosis of influenza from Nasopharyngeal swab specimens and  should not be used as a sole basis for treatment. Nasal washings and  aspirates are unacceptable for Xpert Xpress SARS-CoV-2/FLU/RSV  testing.  Fact Sheet for Patients: PinkCheek.be  Fact Sheet for Healthcare Providers: GravelBags.it  This test is not yet approved or cleared by the [place] FDA and  has been authorized for detection and/or diagnosis of SARS-CoV-2 by  FDA under an Emergency Use Authorization (EUA). This EUA will remain  in effect (meaning this test can be used) for the duration of the  Covid-19 declaration under Section 564(b)(1) of the Act, 21  U.S.C. section 360bbb-3(b)(1), unless the authorization is  terminated or revoked. Performed at [facility], [address] [place], [place] [address]   Glucose, capillary     Status: Abnormal   Collection Time: 02/22/20  6:53 AM  Result Value Ref Range   Glucose-Capillary 207 (H) 70 - 99 mg/dL    Comment: Glucose reference range applies only to samples taken after fasting for at least 8 hours.   Comment 1 Notify RN   Lipid panel     Status: Abnormal   Collection Time: 02/23/20  6:54 AM  Result Value Ref Range   Cholesterol 175 0 - 200 mg/dL   Triglycerides 275 (H) <150 mg/dL   HDL 42 >40 mg/dL   Total CHOL/HDL Ratio 4.2 RATIO   VLDL 55 (H) 0 - 40 mg/dL   LDL Cholesterol 78 0 - 99 mg/dL    Comment:        Total Cholesterol/HDL:CHD Risk Coronary Heart Disease Risk Table                     Men   Women  1/2 Average Risk   3.4   3.3   Average Risk       5.0   4.4  2 X Average Risk   9.6   7.1  3 X Average Risk  23.4   11.0        Use the calculated Patient Ratio above and the CHD Risk Table to determine the patient's CHD Risk.        ATP III CLASSIFICATION (LDL):  <100     mg/dL   Optimal  100-129  mg/dL   Near or Above                    Optimal  130-159  mg/dL   Borderline  160-189  mg/dL   High  >190     mg/dL   Very High Performed at [facility], [address] [place], [place] [address]   Glucose, capillary     Status: Abnormal   Collection Time: 02/23/20  7:00 AM  Result Value Ref Range   Glucose-Capillary 175 (H) 70 - 99 mg/dL    Comment: Glucose reference range applies only to samples taken after fasting for at least 8 hours.   Comment 1 Notify RN     Blood Alcohol level:  Lab Results  Component  Value Date   ETH <10 02/18/2020   ETH <10 67/04/4579    Metabolic Disorder Labs: Lab Results  Component Value Date   HGBA1C 8.8 (H) 12/05/2019   MPG 205.86 12/05/2019   MPG 197.25 05/23/2019   No results found for: PROLACTIN Lab Results  Component Value Date   CHOL 175 02/23/2020   TRIG 275 (H) 02/23/2020   HDL 42 02/23/2020   CHOLHDL 4.2 02/23/2020   VLDL 55 (H) 02/23/2020   LDLCALC 78 02/23/2020   LDLCALC 135 (H) 11/07/2017    Physical Findings: AIMS:  , ,  ,  ,    CIWA:    COWS:     Psychiatric Specialty Exam: Physical Exam Vitalsand nursing notereviewed.  Constitutional:  Appearance: Normal appearance.  HENT:  Head: Normocephalicand atraumatic.  Right Ear: External earnormal.  Left Ear: External earnormal.  Nose: Nose normal.  Mouth/Throat:  Mouth: Mucous membranes are moist.  Pharynx: Oropharynx is clear.  Eyes:  Extraocular Movements: Extraocular movements intact.  Conjunctiva/sclera: Conjunctivae normal.  Pupils: Pupils are equal, round, and reactive to light.  Cardiovascular:  Rate and Rhythm: Normal rate.   Pulses: Normal pulses.  Pulmonary:  Effort: Pulmonary effort is normal.  Breath sounds: Normal breath sounds.  Abdominal:  General: Abdomen is flat.  Palpations: Abdomen is soft.  Musculoskeletal:  General: No swelling.Normal range of motion.  Cervical back: Normal range of motionand neck supple.  Skin: General: Skin is warmand dry.  Neurological:  Mental Status: She is alert.  Motor: [place].  Gait: Gait abnormal.  Psychiatric:  Attention and Perception: She does not perceive auditoryor visualhallucinations.  Mood and Affect: Mood is good. Affect appropriate.  Speech: Speechnormal.  Behavior: Behaviornormal.  Thought Content: Thought content is delusional.  Cognition and Memory: Cognition is impaired.  Judgment: Judgment is impulsive.    Review of Systems  Constitutional: negativeforactivity changeand fatigue.  HENT: Negative forrhinorrheaand sore throat.  Eyes: Negative forphotophobiaand visual disturbance.  Respiratory: Negative forcoughand shortness of breath.  Cardiovascular: Negative forchest painand palpitations.  Gastrointestinal: Negative forconstipation,diarrhea,nauseaand vomiting.  Endocrine: Negative forcold intoleranceand heat intolerance.  Genitourinary: Negative fordifficulty urinatingand dysuria.  Musculoskeletal: feet pain Skin: Negative forrashand wound.  Allergic/Immunologic: Negative forenvironmental allergiesand food allergies.  Neurological: Negative fordizzinessand syncope.  Hematological: Negative foradenopathy.Does not bruise/bleed easily.  P      General Appearance:poorGroomed andwearing a boot to left foot, sitting in wheelchair  Eye Contact:Good  Speech:Normal Rate  Volume:Decreased  Mood: Depressed  Affect: Restricted  Thought Process:organized  Orientation:Full (Time, Place, and Person)  Thought  Content:Logical  Suicidal Thoughts:No  Homicidal Thoughts:No  Memory:Immediate;Fair Recent;Fair Remote;Fair  Judgement:Impaired  Insight:Lacking  Psychomotor Activity:Decreased  Concentration:Concentration:Fairand Attention Span: [place]  Akathisia:Negative  Handed:Right  AIMS (if indicated):   Assets:Communication Skills Desire for Improvement Housing Social Support  ADL's:Impaired  Cognition:Impaired,Moderate  Sleep:        Treatment Plan Summary: Daily contact with patient to assess and evaluate symptoms and progress in treatment, Medication management and Plan Continue inpatient admission. Restart home medications. Will increase Abilify to 15 mg daily for treatment of schizophrenia, decrease Wellbutrin to 150 mg daily with plan to discontinue given concerns for manic-like behavior. Will ask physical therapy to evaluate and treat for deconditioning.   02/22/20 Obtain lipid panel tomorrow Mood: Continue Abilify 15 mg.  Neuropathy: Continue gabapentin 300 mg nightly Diabetes: Metformin 1000 mg daily.  Blood glucose today is 207 this morning.  Continue monitor.  Hemoglobin A1c is 8.8 as  of December 05, 2019 Hypertension: Continue hydrochlorothiazide 12.5 mg Insomnia: Continue trazodone 50 mg Antipsychotic monitoring: No EPS NMS.   02/23/20 Lipid panel reviewed. Elevated triglycerides, LDL and HDL within normal limits Blood glucose 175 this morning  CPT: 55374   Rulon Sera, MD 02/23/2020, 9:55 AM

## 2020-02-23 NOTE — [facility] Group Notes (Signed)
[facility] Group Notes: (Clinical Social Work)   02/23/2020      Type of Therapy:  Group Therapy   Participation Level:  Did Not Attend - was invited individually by Nurse/MHT and chose not to attend.   Raina Mina, [place] 02/23/2020  10:53 AM

## 2020-02-23 NOTE — Plan of Care (Signed)
D- Patient alert and oriented. Patient presents in a depressed, but pleasant mood on assessment stating that she slept ok last night and had no complaints to voice to this Probation officer. Patient denies anxiety, but endorsed depression, stating that "something I'm going through", is why she's feeling this way. Patient also denies SI, HI, AVH, and pain at this time. Patient's goal for today is "to stay positive", in which she will continue to "focus", in order to achieve her goal.  A- Scheduled medications administered to patient, per MD orders. Support and encouragement provided.  Routine safety checks conducted every 15 minutes.  Patient informed to notify staff with problems or concerns.  R- No adverse drug reactions noted. Patient contracts for safety at this time. Patient compliant with medications. Patient receptive, calm, and cooperative. Patient interacts well with others on the unit.  Patient remains safe at this time.  Problem: Education: Goal: Knowledge of [facility] Education information/materials will improve Outcome: Progressing Goal: Mental status will improve Outcome: Progressing Goal: Verbalization of understanding the information provided will improve Outcome: Progressing   Problem: Health Behavior/Discharge Planning: Goal: Identification of resources available to assist in meeting health care needs will improve Outcome: Progressing Goal: Compliance with treatment plan for underlying cause of condition will improve Outcome: Progressing   Problem: Activity: Goal: Will identify at least one activity in which they can participate Outcome: Progressing   Problem: Coping: Goal: Ability to identify and develop effective coping behavior will improve Outcome: Progressing Goal: Ability to interact with others will improve Outcome: Progressing Goal: Demonstration of participation in decision-making regarding own care will improve Outcome: Progressing Goal: Ability to use eye  contact when communicating with others will improve Outcome: Progressing   Problem: Self-Concept: Goal: Will verbalize positive feelings about self Outcome: Progressing

## 2020-02-23 NOTE — Progress Notes (Signed)
Patient is quiet and reserved. She is alert and oriented X4. She is active on the unit doing puzzles in the community room. She is isolative to herself, but will engage with others when approached.  She is med compliant and tolerated her medication without incident. She denies SI HI AVH depression and anxiety at this encounter.  She rates her pain at 2 and does not need medication to treat.  She was encouraged to contacts staff with any concerns and is safe on the unit with 15 minute safety checks.    Cleo Butler-Nicholson, LPN

## 2020-02-23 NOTE — Plan of Care (Signed)
  Problem: Education: Goal: Knowledge of [facility] Education information/materials will improve Outcome: Progressing Goal: Mental status will improve Outcome: Progressing Goal: Verbalization of understanding the information provided will improve Outcome: Progressing   Problem: Health Behavior/Discharge Planning: Goal: Identification of resources available to assist in meeting health care needs will improve Outcome: Progressing Goal: Compliance with treatment plan for underlying cause of condition will improve Outcome: Progressing   Problem: Activity: Goal: Will identify at least one activity in which they can participate Outcome: Progressing   Problem: Coping: Goal: Ability to identify and develop effective coping behavior will improve Outcome: Progressing Goal: Ability to interact with others will improve Outcome: Progressing Goal: Demonstration of participation in decision-making regarding own care will improve Outcome: Progressing Goal: Ability to use eye contact when communicating with others will improve Outcome: Progressing   Problem: Self-Concept: Goal: Will verbalize positive feelings about self Outcome: Progressing

## 2020-02-23 NOTE — BHH Counselor (Signed)
CSW made a second attempt to reach [name], Patients daughter to complete SPE. CSW Herschel Senegal made first attempt on 02/22/20 at 10:50 AM and did not get a response. Voicemail was not setup.

## 2020-02-24 ENCOUNTER — Telehealth: Payer: Medicare Other

## 2020-02-24 LAB — GLUCOSE, CAPILLARY: Glucose-Capillary: 119 mg/dL — ABNORMAL HIGH (ref 70–99)

## 2020-02-24 NOTE — Progress Notes (Signed)
Patient is in a pleasant mood.  She is alert and oriented. She denies SI  HI   AVH anxiety and pain at this encounter.  She continues to endorse depression and  rates it at 4 at this encounter.  She received scheduled medication and tolerated without incident.  She has been more active on the unit and is starting to engage more with her peers.  She is safe on the unit with 15 minute safety checks and encouraged to contact staff with any concerns.    Cleo Butler-Nicholson, LPN

## 2020-02-24 NOTE — Plan of Care (Signed)
Patient is appropriate with staff & peers.Patient stated that she realized the stupidity about online dating.Appreciated patient for that.Denies SI,HI and AVH.Using walker for ambulation.Compliant with medications.Appetite and energy level good.Support and encouragement given.

## 2020-02-24 NOTE — BHH Group Notes (Signed)
LCSW Group Therapy Note   02/24/2020 2:26 PM  Type of Therapy and Topic:  Group Therapy:  Overcoming Obstacles   Participation Level:  Minimal   Description of Group:    In this group patients will be encouraged to explore what they see as obstacles to their own wellness and recovery. They will be guided to discuss their thoughts, feelings, and behaviors related to these obstacles. The group will process together ways to cope with barriers, with attention given to specific choices patients can make. Each patient will be challenged to identify changes they are motivated to make in order to overcome their obstacles. This group will be process-oriented, with patients participating in exploration of their own experiences as well as giving and receiving support and challenge from other group members.   Therapeutic Goals: 1. Patient will identify personal and current obstacles as they relate to admission. 2. Patient will identify barriers that currently interfere with their wellness or overcoming obstacles.  3. Patient will identify feelings, thought process and behaviors related to these barriers. 4. Patient will identify two changes they are willing to make to overcome these obstacles:      Summary of Patient Progress Patient contributed to the conversation. She identified peer pressure as an obstacle that relates to her admission. Patient stated that she needs to remain on her medication, communicate with her natural support, and stay off the internet to address this obstacle.      Therapeutic Modalities:   Cognitive Behavioral Therapy Solution Focused Therapy Motivational Interviewing Relapse Prevention Therapy  Hedy Camara R. Guerry Bruin, MSW, [place], [place] 02/24/2020 2:26 PM

## 2020-02-24 NOTE — Progress Notes (Signed)
[facility] MD Progress Note  02/24/2020 1:25 PM [name]  MRN:  [id number] Subjective:  Patient seen one-on-one today. She reports she has been feeling down and depressed. She notes that she is not sure she wants to participate in groups today as it is difficult to be around others. She continues to feel guilt about her online risky activity. She feels she is tolerating her medications well without side effects, and hopes her depression will improve soon. She denies any active suicidal ideation, homicidal ideation, visual hallucination, or auditory hallucinations at this time. She was able to work with PT on Friday who do recommend continued physical therapy for weakness and deconditioning.    Principal Problem: Schizophrenia ([place]) Diagnosis: Principal Problem:   Schizophrenia ([place]) Active Problems:   DM (diabetes mellitus), type 2, 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[place])   Essential hypertension   Hyperlipidemia associated with type 2 diabetes mellitus ([place])  Total Time spent with patient: 30 minutes  Past Psychiatric History: Pt has a history of Schizoaffective Disorder, Bipolar I type, and that Pt receives outpatient psychiatric services for this disorder through [facility]. Daughter reported that she went through [name] yesterday and saw that Pt has not taken any psychotropic medication since July 2021 (as evidenced by presence of unused bubble-wrap medication containers). Daughter also reported that Pt has expressed suicidal ideation since June or July and has threatened to overdose. Daughter also stated that since May, Pt has engaged in potentially dangerous behavior -- per daughter, [name] has contacted daughter and police about Pt receiving numerous female visitors at night, about Pt greeting visitors in the nude, and Pt giving her money to scammers.  Past Medical History:  Past Medical History:  Diagnosis Date  . Charcot's joint of foot, left   . Depression   . Diabetes  mellitus without complication ([place])   . Hyperlipidemia   . Hypertension   . Neuromuscular disorder ([place])   . Schizo affective schizophrenia ([place])    abilify and pazil    Past Surgical History:  Procedure Laterality Date  . CHOLECYSTECTOMY    . COLONOSCOPY WITH PROPOFOL N/A 05/26/2019   Procedure: COLONOSCOPY WITH PROPOFOL;  Surgeon: Toledo, Benay Pike, MD;  Location: [facility] ENDOSCOPY;  Service: Gastroenterology;  Laterality: N/A;  . ESOPHAGOGASTRODUODENOSCOPY (EGD) WITH PROPOFOL N/A 05/26/2019   Procedure: ESOPHAGOGASTRODUODENOSCOPY (EGD) WITH PROPOFOL;  Surgeon: Toledo, Benay Pike, MD;  Location: [facility] ENDOSCOPY;  Service: Gastroenterology;  Laterality: N/A;  . IRRIGATION AND DEBRIDEMENT FOOT Right 02/26/2016   Procedure: RIGHT 2ND TOE DEBRIDEMENT;  Surgeon: Samara Deist, DPM;  Location: [facility] ORS;  Service: Podiatry;  Laterality: Right;  . IRRIGATION AND DEBRIDEMENT FOOT Left 12/13/2018   Procedure: IRRIGATION AND DEBRIDEMENT FOOT;  Surgeon: Caroline More, DPM;  Location: [facility] ORS;  Service: Podiatry;  Laterality: Left;   Family History:  Family History  Problem Relation Age of Onset  . Breast cancer Cousin        maternal cousin  . Hypertension Mother   . Hyperthyroidism Mother   . Dementia Mother   . Prostate cancer Father   . Diabetes Maternal Grandmother   . Hypertension Maternal Grandmother   . Cancer Maternal Grandmother        unknown type  . Diabetes Maternal Grandfather   . Hypertension Maternal Grandfather   . Diabetes Paternal Grandmother   . Hypertension Paternal Grandmother   . Diabetes Paternal Grandfather   . Hypertension Paternal Grandfather    Family Psychiatric  History: Denies Social History:  Social History  Substance and Sexual Activity  Alcohol Use No     Social History   Substance and Sexual Activity  Drug Use No    Social History   Socioeconomic History  . Marital status: Divorced    Spouse name: Not on file  . Number of children: 2  . Years  of education: Not on file  . Highest education level: Not on file  Occupational History  . Not on file  Tobacco Use  . Smoking status: Former Smoker    Packs/day: 3.00    Years: 30.00    Pack years: 90.00    Types: Cigarettes    Quit date: 05/02/2009    Years since quitting: 10.8  . Smokeless tobacco: Never Used  . Tobacco comment: Pt reported  quitting in 2011  Vaping Use  . Vaping Use: Never used  Substance and Sexual Activity  . Alcohol use: No  . Drug use: No  . Sexual activity: Not Currently  Other Topics Concern  . Not on file  Social History Narrative   At home with mom, 89. Lives in brothers house who moved out. Sister comes by every other day to check on patient and mom.   Social Determinants of Health   Financial Resource Strain: Low Risk   . Difficulty of Paying Living Expenses: Not hard at all  Food Insecurity: No Food Insecurity  . Worried About Charity fundraiser in the Last Year: Never true  . Ran Out of Food in the Last Year: Never true  Transportation Needs: No Transportation Needs  . Lack of Transportation (Medical): No  . Lack of Transportation (Non-Medical): No  Physical Activity: Inactive  . Days of Exercise per Week: 0 days  . Minutes of Exercise per Session: 0 min  Stress: No Stress Concern Present  . Feeling of Stress : Only a little  Social Connections: Moderately Isolated  . Frequency of Communication with Friends and Family: More than three times a week  . Frequency of Social Gatherings with Friends and Family: More than three times a week  . Attends Religious Services: More than 4 times per year  . Active Member of Clubs or Organizations: No  . Attends Archivist Meetings: Never  . Marital Status: Divorced   Additional Social History:                         Sleep: Fair  Appetite:  Fair  Current Medications: Current Facility-Administered Medications  Medication Dose Route Frequency Provider Last Rate Last Admin   . acetaminophen (TYLENOL) tablet 650 mg  650 mg Oral Q6H PRN Clapacs, Madie Reno, MD   650 mg at 02/24/20 1751  . alum & mag hydroxide-simeth (MAALOX/MYLANTA) 200-200-20 MG/5ML suspension 30 mL  30 mL Oral Q4H PRN Clapacs, John T, MD      . ARIPiprazole (ABILIFY) tablet 15 mg  15 mg Oral QHS Salley Scarlet, MD   15 mg at 02/23/20 2123  . aspirin EC tablet 81 mg  81 mg Oral Daily Clapacs, Madie Reno, MD   81 mg at 02/24/20 0814  . atorvastatin (LIPITOR) tablet 40 mg  40 mg Oral Daily Clapacs, Madie Reno, MD   40 mg at 02/24/20 0815  . buPROPion (WELLBUTRIN XL) 24 hr tablet 150 mg  150 mg Oral Daily Salley Scarlet, MD   150 mg at 02/24/20 0815  . colestipol (COLESTID) tablet 1 g  1 g Oral Daily Clapacs, John T,  MD   1 g at 02/24/20 0816  . ferrous sulfate tablet 325 mg  325 mg Oral Daily Clapacs, John T, MD   325 mg at 02/24/20 0815  . furosemide (LASIX) tablet 40 mg  40 mg Oral Daily Clapacs, Madie Reno, MD   40 mg at 02/24/20 0816  . gabapentin (NEURONTIN) capsule 300 mg  300 mg Oral QHS Clapacs, John T, MD   300 mg at 02/23/20 2123  . hydrochlorothiazide (HYDRODIURIL) tablet 12.5 mg  12.5 mg Oral Daily Clapacs, Madie Reno, MD   12.5 mg at 02/24/20 0815  . insulin glargine (LANTUS) injection 24 Units  24 Units Subcutaneous Daily Clapacs, Madie Reno, MD   24 Units at 02/24/20 0815  . magnesium hydroxide (MILK OF MAGNESIA) suspension 30 mL  30 mL Oral Daily PRN Clapacs, John T, MD      . metFORMIN (GLUCOPHAGE) tablet 1,000 mg  1,000 mg Oral Q breakfast Clapacs, Madie Reno, MD   1,000 mg at 02/24/20 0814  . metoprolol succinate (TOPROL-XL) 24 hr tablet 50 mg  50 mg Oral Daily Clapacs, Madie Reno, MD   50 mg at 02/24/20 0814  . ondansetron (ZOFRAN) tablet 4 mg  4 mg Oral Q8H PRN Clapacs, John T, MD      . pantoprazole (PROTONIX) EC tablet 40 mg  40 mg Oral BID AC Clapacs, Madie Reno, MD   40 mg at 02/24/20 0815  . traZODone (DESYREL) tablet 50 mg  50 mg Oral QHS PRN Clapacs, Madie Reno, MD   50 mg at 02/20/20 2133    Lab Results:   Results for orders placed or performed during the hospital encounter of 02/20/20 (from the past 48 hour(s))  Lipid panel     Status: Abnormal   Collection Time: 02/23/20  6:54 AM  Result Value Ref Range   Cholesterol 175 0 - 200 mg/dL   Triglycerides 275 (H) <150 mg/dL   HDL 42 >40 mg/dL   Total CHOL/HDL Ratio 4.2 RATIO   VLDL 55 (H) 0 - 40 mg/dL   LDL Cholesterol 78 0 - 99 mg/dL    Comment:        Total Cholesterol/HDL:CHD Risk Coronary Heart Disease Risk Table                     Men   Women  1/2 Average Risk   3.4   3.3  Average Risk       5.0   4.4  2 X Average Risk   9.6   7.1  3 X Average Risk  23.4   11.0        Use the calculated Patient Ratio above and the CHD Risk Table to determine the patient's CHD Risk.        ATP III CLASSIFICATION (LDL):  <100     mg/dL   Optimal  100-129  mg/dL   Near or Above                    Optimal  130-159  mg/dL   Borderline  160-189  mg/dL   High  >190     mg/dL   Very High Performed at [facility], [address] [place], [place] [address]   Glucose, capillary     Status: Abnormal   Collection Time: 02/23/20  7:00 AM  Result Value Ref Range   Glucose-Capillary 175 (H) 70 - 99 mg/dL    Comment: Glucose reference range applies only to  samples taken after fasting for at least 8 hours.   Comment 1 Notify RN   Glucose, capillary     Status: Abnormal   Collection Time: 02/24/20  7:00 AM  Result Value Ref Range   Glucose-Capillary 119 (H) 70 - 99 mg/dL    Comment: Glucose reference range applies only to samples taken after fasting for at least 8 hours.   Comment 1 Notify RN     Blood Alcohol level:  Lab Results  Component Value Date   ETH <10 02/18/2020   ETH <10 19/14/7829    Metabolic Disorder Labs: Lab Results  Component Value Date   HGBA1C 8.8 (H) 12/05/2019   MPG 205.86 12/05/2019   MPG 197.25 05/23/2019   No results found for: PROLACTIN Lab Results  Component Value Date   CHOL 175 02/23/2020   TRIG  275 (H) 02/23/2020   HDL 42 02/23/2020   CHOLHDL 4.2 02/23/2020   VLDL 55 (H) 02/23/2020   LDLCALC 78 02/23/2020   LDLCALC 135 (H) 11/07/2017    Physical Findings: AIMS:  , ,  ,  ,    CIWA:    COWS:     Musculoskeletal: Strength & Muscle Tone: decreased Gait & Station: using wheelchair Patient leans: N/A  Psychiatric Specialty Exam: Physical Exam Vitalsand nursing notereviewed.  Constitutional:  Appearance: Normal appearance.  HENT:  Head: Normocephalicand atraumatic.  Right Ear: External earnormal.  Left Ear: External earnormal.  Nose: Nose normal.  Mouth/Throat:  Mouth: Mucous membranes are moist.  Pharynx: Oropharynx is clear.  Eyes:  Extraocular Movements: Extraocular movements intact.  Conjunctiva/sclera: Conjunctivae normal.  Pupils: Pupils are equal, round, and reactive to light.  Cardiovascular:  Rate and Rhythm: Normal rate.  Pulses: Normal pulses.  Pulmonary:  Effort: Pulmonary effort is normal.  Breath sounds: Normal breath sounds.  Abdominal:  General: Abdomen is flat.  Palpations: Abdomen is soft.  Musculoskeletal:  General: No swelling.Normal range of motion.  Cervical back: Normal range of motionand neck supple.  Skin: General: Skin is warmand dry.  Neurological:  Mental Status: She is alert.  Motor: [place].  Gait: Gait abnormal.  Psychiatric:  Attention and Perception: She does not perceive auditoryor visualhallucinations.  Mood and Affect: Mood is anxious. Affect isinappropriate.  Speech: Speechnormal.  Behavior: Behaviornormal.  Thought Content: Thought content is delusional.  Cognition and Memory: Cognition is impaired.  Judgment: Judgment is impulsive.    Review of Systems  Constitutional: Positive foractivity changeand fatigue.  HENT: Negative forrhinorrheaand sore throat.  Eyes:  Negative forphotophobiaand visual disturbance.  Respiratory: Negative forcoughand shortness of breath.  Cardiovascular: Negative forchest painand palpitations.  Gastrointestinal: Negative forconstipation,diarrhea,nauseaand vomiting.  Endocrine: Negative forcold intoleranceand heat intolerance.  Genitourinary: Negative fordifficulty urinatingand dysuria.  Musculoskeletal: Positive forarthralgias,back pain,gait problemand joint swelling.  Skin: Negative forrashand wound.  Allergic/Immunologic: Negative forenvironmental allergiesand food allergies.  Neurological: Negative fordizzinessand syncope.  Hematological: Negative foradenopathy.Does not bruise/bleed easily.  Psychiatric/Behavioral: Positive forbehavioral problems,confusion,decreased concentrationand sleep disturbance. The patientis nervous/anxiousand is hyperactive.       Blood pressure 133/80, pulse 84, temperature (!) 97.5 F (36.4 C), temperature source Oral, resp. rate 18, height 5\' 10"  (1.778 m), weight 106.6 kg, SpO2 100 %.Body mass index is 33.72 kg/m.  General Appearance: Fairly Groomed  Eye Contact:  Fair  Speech:  Normal Rate  Volume:  Decreased  Mood:  Depressed and Dysphoric  Affect:  Congruent  Thought Process:  Coherent  Orientation:  Full (Time, Place, and Person)  Thought Content:  Logical  Suicidal Thoughts:  Yes.  without intent/plan  Homicidal Thoughts:  No  Memory:  Immediate;   Fair Recent;   Fair Remote;   Fair  Judgement:  Fair  Insight:  Fair  Psychomotor Activity:  Decreased  Concentration:  Concentration: Fair and Attention Span: Fair  Recall:  [employer] of Knowledge:  Fair  Language:  Fair  Akathisia:  Negative  Handed:  Right  AIMS (if indicated):     Assets:  Communication Skills Desire for Improvement Financial Resources/Insurance Housing  ADL's:  Impaired  Cognition:  WNL  Sleep:  Number of Hours: 6     Treatment Plan Summary: Daily contact  with patient to assess and evaluate symptoms and progress in treatment, Medication management and Plan continue medications as above.   Salley Scarlet, MD 02/24/2020, 1:25 PM

## 2020-02-24 NOTE — Progress Notes (Signed)
Recreation Therapy Notes  Date: 02/24/2020  Time: 9:30 am    Location: Craft room   Behavioral response: Appropriate  Intervention Topic: Relaxation  Discussion/Intervention:  Group content today was focused on relaxation. The group defined relaxation and identified healthy ways to relax. Individuals expressed how much time they spend relaxing. Patients expressed how much their life would be if they did not make time for themselves to relax. The group stated ways they could improve their relaxation techniques in the future.  Individuals participated in the intervention "Time to Relax" where they had a chance to experience different relaxation techniques.  Clinical Observations/Feedback: Patient came to group and was focused on what peers and staff had to say about communication. Individual was social with peers and staff while participating in the intervention.  Husein Guedes LRT/CTRS         Amulya Quintin 02/24/2020 11:30 AM

## 2020-02-25 LAB — GLUCOSE, CAPILLARY: Glucose-Capillary: 131 mg/dL — ABNORMAL HIGH (ref 70–99)

## 2020-02-25 MED ORDER — WHITE PETROLATUM EX OINT
TOPICAL_OINTMENT | CUTANEOUS | Status: AC
  Filled 2020-02-25: qty 5

## 2020-02-25 NOTE — BHH Counselor (Signed)
CSW received the following call backs:  [facility], Corene Cornea, [phone] Can not accept the patient.  [place] at [facility], [phone] Called back to cancel referral. Can not see patients that live in [place].    CSW contacted the following:  [place], [phone] Requested that order be faxed to [phone] Attention intake  [facility], [phone] Does not service the area.    [facility], [phone] Do not carry [place] at this time.   [facility], [phone] Do not serve [facility].  [facility], [phone] CSW was placed on hold with no pick up.    CSW contacted the [place] at [facility] for additional assistance.  Assunta Curtis, MSW, LCSW 02/25/2020 5:15 PM

## 2020-02-25 NOTE — Progress Notes (Signed)
Recreation Therapy Notes  Date: 02/25/2020  Time: 9:30 am    Location: Craft room   Behavioral response: Appropriate  Intervention Topic: Stress Management    Discussion/Intervention:  Group content on today was focused on stress. The group defined stress and way to cope with stress. Participants expressed how they know when they are stresses out. Individuals described the different ways they have to cope with stress. The group stated reasons why it is important to cope with stress. Patient explained what good stress is and some examples. The group participated in the intervention "Stress Management". Individuals were separated into two group and answered questions related to stress.  .  Clinical Observations/Feedback: Patient came to group late and was focused on what peers and staff had to say about stress management. Individual was social with peers and staff while participating in the intervention.  [name] LRT/CTRS          [name] 02/25/2020 11:45 AM

## 2020-02-25 NOTE — Progress Notes (Signed)
D: Pt alert and oriented. Pt rates depression 0/10, hopelessness 0/10, and anxiety 0/10. Pt goal: "Move and Walk Better." Pt reports energy level as normal and concentration as being good. Pt reports sleep last night as being good. Pt did not receive medications for sleep. Pt denies experiencing any pain at this time. Pt denies experiencing any SI/HI, or AVH at this time.   A: Scheduled medications administered to pt, per MD orders. Support and encouragement provided. Frequent verbal contact made. Routine safety checks conducted q15 minutes.   R: No adverse drug reactions noted. Pt verbally contracts for safety at this time. Pt complaint with medications and treatment plan. Pt interacts well with others on the unit. Pt remains safe at this time. Will continue to monitor.

## 2020-02-25 NOTE — BHH Group Notes (Signed)
LCSW Group Therapy Note  02/25/2020 3:38 PM  Type of Therapy/Topic:  Group Therapy:  Feelings about Diagnosis  Participation Level:  None   Description of Group:   This group will allow patients to explore their thoughts and feelings about diagnoses they have received. Patients will be guided to explore their level of understanding and acceptance of these diagnoses. Facilitator will encourage patients to process their thoughts and feelings about the reactions of others to their diagnosis and will guide patients in identifying ways to discuss their diagnosis with significant others in their lives. This group will be process-oriented, with patients participating in exploration of their own experiences, giving and receiving support, and processing challenge from other group members.   Therapeutic Goals: 1. Patient will demonstrate understanding of diagnosis as evidenced by identifying two or more symptoms of the disorder 2. Patient will be able to express two feelings regarding the diagnosis 3. Patient will demonstrate their ability to communicate their needs through discussion and/or role play  Summary of Patient Progress: Patient was present in group, however, did not engage in group discussion.   Therapeutic Modalities:   Cognitive Behavioral Therapy Brief Therapy Feelings Identification   Assunta Curtis, MSW, LCSW 02/25/2020 3:38 PM

## 2020-02-25 NOTE — Progress Notes (Signed)
[facility] MD Progress Note  02/25/2020 1:34 PM [name]  MRN:  [id number] Subjective:  Patient seen one-on-one today. She feels she is tolerating her medications well without side effects, and feels her depression has improved. She denies any active suicidal ideation, homicidal ideation, visual hallucination, or auditory hallucinations at this time. Her thought process has cleared, and she is able to create "to-do" lists, and has also made a grocery list for when she leaves home. Today she is ambulating with a walker instead of using wheel chair. She plans to steer clear from men at her apartment and online, and focus on her own well-being. She feels she is ready to return home with outpatient therapy and continued physical therapy. Tentative discharge tomorrow pending home PT arrangements.   Principal Problem: Schizophrenia ([place]) Diagnosis: Principal Problem:   Schizophrenia ([place]) Active Problems:   DM (diabetes mellitus), type 2, 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[place])   Essential hypertension   Hyperlipidemia associated with type 2 diabetes mellitus ([place])  Total Time spent with patient: 30 minutes  Past Psychiatric History: Pt has a history of Schizoaffective Disorder, Bipolar I type, and that Pt receives outpatient psychiatric services for this disorder through [place]. Daughter reported that she went through [name] yesterday and saw that Pt has not taken any psychotropic medication since July 2021 (as evidenced by presence of unused bubble-wrap medication containers). Daughter also reported that Pt has expressed suicidal ideation since June or July and has threatened to overdose. Daughter also stated that since May, Pt has engaged in potentially dangerous behavior -- per daughter, [name] has contacted daughter and police about Pt receiving numerous female visitors at night, about Pt greeting visitors in the nude, and Pt giving her money to scammers.  Past Medical History:   Past Medical History:  Diagnosis Date  . Charcot's joint of foot, left   . Depression   . Diabetes mellitus without complication ([place])   . Hyperlipidemia   . Hypertension   . Neuromuscular disorder ([place])   . Schizo affective schizophrenia ([place])    abilify and pazil    Past Surgical History:  Procedure Laterality Date  . CHOLECYSTECTOMY    . COLONOSCOPY WITH PROPOFOL N/A 05/26/2019   Procedure: COLONOSCOPY WITH PROPOFOL;  Surgeon: Toledo, Benay Pike, MD;  Location: [facility] ENDOSCOPY;  Service: Gastroenterology;  Laterality: N/A;  . ESOPHAGOGASTRODUODENOSCOPY (EGD) WITH PROPOFOL N/A 05/26/2019   Procedure: ESOPHAGOGASTRODUODENOSCOPY (EGD) WITH PROPOFOL;  Surgeon: Toledo, Benay Pike, MD;  Location: [facility] ENDOSCOPY;  Service: Gastroenterology;  Laterality: N/A;  . IRRIGATION AND DEBRIDEMENT FOOT Right 02/26/2016   Procedure: RIGHT 2ND TOE DEBRIDEMENT;  Surgeon: Samara Deist, DPM;  Location: [facility] ORS;  Service: Podiatry;  Laterality: Right;  . IRRIGATION AND DEBRIDEMENT FOOT Left 12/13/2018   Procedure: IRRIGATION AND DEBRIDEMENT FOOT;  Surgeon: Caroline More, DPM;  Location: [facility] ORS;  Service: Podiatry;  Laterality: Left;   Family History:  Family History  Problem Relation Age of Onset  . Breast cancer Cousin        maternal cousin  . Hypertension Mother   . Hyperthyroidism Mother   . Dementia Mother   . Prostate cancer Father   . Diabetes Maternal Grandmother   . Hypertension Maternal Grandmother   . Cancer Maternal Grandmother        unknown type  . Diabetes Maternal Grandfather   . Hypertension Maternal Grandfather   . Diabetes Paternal Grandmother   . Hypertension Paternal Grandmother   . Diabetes Paternal Grandfather   . Hypertension Paternal  Grandfather    Family Psychiatric  History: Denies Social History:  Social History   Substance and Sexual Activity  Alcohol Use No     Social History   Substance and Sexual Activity  Drug Use No    Social History    Socioeconomic History  . Marital status: Divorced    Spouse name: Not on file  . Number of children: 2  . Years of education: Not on file  . Highest education level: Not on file  Occupational History  . Not on file  Tobacco Use  . Smoking status: Former Smoker    Packs/day: 3.00    Years: 30.00    Pack years: 90.00    Types: Cigarettes    Quit date: 05/02/2009    Years since quitting: 10.8  . Smokeless tobacco: Never Used  . Tobacco comment: Pt reported  quitting in 2011  Vaping Use  . Vaping Use: Never used  Substance and Sexual Activity  . Alcohol use: No  . Drug use: No  . Sexual activity: Not Currently  Other Topics Concern  . Not on file  Social History Narrative   At home with mom, 27. Lives in brothers house who moved out. Sister comes by every other day to check on patient and mom.   Social Determinants of Health   Financial Resource Strain: Low Risk   . Difficulty of Paying Living Expenses: Not hard at all  Food Insecurity: No Food Insecurity  . Worried About Charity fundraiser in the Last Year: Never true  . Ran Out of Food in the Last Year: Never true  Transportation Needs: No Transportation Needs  . Lack of Transportation (Medical): No  . Lack of Transportation (Non-Medical): No  Physical Activity: Inactive  . Days of Exercise per Week: 0 days  . Minutes of Exercise per Session: 0 min  Stress: No Stress Concern Present  . Feeling of Stress : Only a little  Social Connections: Moderately Isolated  . Frequency of Communication with Friends and Family: More than three times a week  . Frequency of Social Gatherings with Friends and Family: More than three times a week  . Attends Religious Services: More than 4 times per year  . Active Member of Clubs or Organizations: No  . Attends Archivist Meetings: Never  . Marital Status: Divorced   Additional Social History:                         Sleep: Fair  Appetite:  Fair  Current  Medications: Current Facility-Administered Medications  Medication Dose Route Frequency Provider Last Rate Last Admin  . acetaminophen (TYLENOL) tablet 650 mg  650 mg Oral Q6H PRN Clapacs, Madie Reno, MD   650 mg at 02/24/20 7412  . alum & mag hydroxide-simeth (MAALOX/MYLANTA) 200-200-20 MG/5ML suspension 30 mL  30 mL Oral Q4H PRN Clapacs, John T, MD      . ARIPiprazole (ABILIFY) tablet 15 mg  15 mg Oral QHS Salley Scarlet, MD   15 mg at 02/24/20 2127  . aspirin EC tablet 81 mg  81 mg Oral Daily Clapacs, Madie Reno, MD   81 mg at 02/25/20 0814  . atorvastatin (LIPITOR) tablet 40 mg  40 mg Oral Daily Clapacs, Madie Reno, MD   40 mg at 02/25/20 0813  . buPROPion (WELLBUTRIN XL) 24 hr tablet 150 mg  150 mg Oral Daily Salley Scarlet, MD   150 mg at 02/25/20  2952  . colestipol (COLESTID) tablet 1 g  1 g Oral Daily Clapacs, Madie Reno, MD   1 g at 02/25/20 0814  . ferrous sulfate tablet 325 mg  325 mg Oral Daily Clapacs, Madie Reno, MD   325 mg at 02/25/20 0813  . furosemide (LASIX) tablet 40 mg  40 mg Oral Daily Clapacs, Madie Reno, MD   40 mg at 02/25/20 0814  . gabapentin (NEURONTIN) capsule 300 mg  300 mg Oral QHS Clapacs, John T, MD   300 mg at 02/24/20 2128  . hydrochlorothiazide (HYDRODIURIL) tablet 12.5 mg  12.5 mg Oral Daily Clapacs, Madie Reno, MD   12.5 mg at 02/25/20 0813  . insulin glargine (LANTUS) injection 24 Units  24 Units Subcutaneous Daily Clapacs, Madie Reno, MD   24 Units at 02/25/20 [id number]  . magnesium hydroxide (MILK OF MAGNESIA) suspension 30 mL  30 mL Oral Daily PRN Clapacs, John T, MD      . metFORMIN (GLUCOPHAGE) tablet 1,000 mg  1,000 mg Oral Q breakfast Clapacs, Madie Reno, MD   1,000 mg at 02/25/20 0814  . metoprolol succinate (TOPROL-XL) 24 hr tablet 50 mg  50 mg Oral Daily Clapacs, Madie Reno, MD   50 mg at 02/25/20 0813  . ondansetron (ZOFRAN) tablet 4 mg  4 mg Oral Q8H PRN Clapacs, John T, MD      . pantoprazole (PROTONIX) EC tablet 40 mg  40 mg Oral BID AC Clapacs, Madie Reno, MD   40 mg at 02/25/20 0814  .  traZODone (DESYREL) tablet 50 mg  50 mg Oral QHS PRN Clapacs, Madie Reno, MD   50 mg at 02/24/20 2128    Lab Results:  Results for orders placed or performed during the hospital encounter of 02/20/20 (from the past 48 hour(s))  Glucose, capillary     Status: Abnormal   Collection Time: 02/24/20  7:00 AM  Result Value Ref Range   Glucose-Capillary 119 (H) 70 - 99 mg/dL    Comment: Glucose reference range applies only to samples taken after fasting for at least 8 hours.   Comment 1 Notify RN   Glucose, capillary     Status: Abnormal   Collection Time: 02/25/20  7:09 AM  Result Value Ref Range   Glucose-Capillary 131 (H) 70 - 99 mg/dL    Comment: Glucose reference range applies only to samples taken after fasting for at least 8 hours.    Blood Alcohol level:  Lab Results  Component Value Date   ETH <10 02/18/2020   ETH <10 24/40/1027    Metabolic Disorder Labs: Lab Results  Component Value Date   HGBA1C 8.8 (H) 12/05/2019   MPG 205.86 12/05/2019   MPG 197.25 05/23/2019   No results found for: PROLACTIN Lab Results  Component Value Date   CHOL 175 02/23/2020   TRIG 275 (H) 02/23/2020   HDL 42 02/23/2020   CHOLHDL 4.2 02/23/2020   VLDL 55 (H) 02/23/2020   LDLCALC 78 02/23/2020   LDLCALC 135 (H) 11/07/2017    Physical Findings: AIMS:  , ,  ,  ,    CIWA:    COWS:     Musculoskeletal: Strength & Muscle Tone: decreased Gait & Station: unsteady Patient leans: Front  Psychiatric Specialty Exam: Physical Exam Vitalsand nursing notereviewed.  Constitutional:  Appearance: Normal appearance.  HENT:  Head: Normocephalicand atraumatic.  Right Ear: External earnormal.  Left Ear: External earnormal.  Nose: Nose normal.  Mouth/Throat:  Mouth: Mucous membranes are moist.  Pharynx:  Oropharynx is clear.  Eyes:  Extraocular Movements: Extraocular movements intact.  Conjunctiva/sclera: Conjunctivae normal.  Pupils: Pupils are equal,  round, and reactive to light.  Cardiovascular:  Rate and Rhythm: Normal rate.  Pulses: Normal pulses.  Pulmonary:  Effort: Pulmonary effort is normal.  Breath sounds: Normal breath sounds.  Abdominal:  General: Abdomen is flat.  Palpations: Abdomen is soft.  Musculoskeletal:  General: No swelling.Normal range of motion.  Cervical back: Normal range of motionand neck supple.  Skin: General: Skin is warmand dry.  Neurological:  Mental Status: She is alert.  Motor: [place].  Gait: Gait abnormal.  Psychiatric:  Attention and Perception: She does not perceive auditoryor visualhallucinations.  Mood and Affect: Mood is euthymic.  Affect is appropriate. Speech: Speechnormal.  Behavior: Behaviornormal.  Thought Content: Thought content is logical.  Cognition and Memory: Cognition is normal  Judgment: Judgment is intact    Review of Systems  Constitutional: Positive foractivity changeand fatigue.  HENT: Negative forrhinorrheaand sore throat.  Eyes: Negative forphotophobiaand visual disturbance.  Respiratory: Negative forcoughand shortness of breath.  Cardiovascular: Negative forchest painand palpitations.  Gastrointestinal: Negative forconstipation,diarrhea,nauseaand vomiting.  Endocrine: Negative forcold intoleranceand heat intolerance.  Genitourinary: Negative fordifficulty urinatingand dysuria.  Musculoskeletal: Positive forarthralgias,back pain,gait problemand joint swelling.  Skin: Negative forrashand wound.  Allergic/Immunologic: Negative forenvironmental allergiesand food allergies.  Neurological: Negative fordizzinessand syncope.  Hematological: Negative foradenopathy.Does not bruise/bleed easily.  Psychiatric/Behavioral: Negative for behavioral problems and confusion. Normal cognition. Negative for sleep disturbance. Euthymic mood.         Blood pressure 123/76, pulse 76, temperature (!) 97.5 F (36.4 C), temperature source Oral, resp. rate 17, height 5\' 10"  (1.778 m), weight 106.6 kg, SpO2 100 %.Body mass index is 33.72 kg/m.  General Appearance: Fairly Groomed  Eye Contact:  Fair  Speech:  Normal Rate  Volume:  Decreased  Mood:  Depressed and Dysphoric  Affect:  Congruent  Thought Process:  Coherent  Orientation:  Full (Time, Place, and Person)  Thought Content:  Logical  Suicidal Thoughts:  No  Homicidal Thoughts:  No  Memory:  Immediate;   Fair Recent;   Fair Remote;   Fair  Judgement:  Fair  Insight:  Fair  Psychomotor Activity:  Decreased  Concentration:  Concentration: Fair and Attention Span: Fair  Recall:  [employer] of Knowledge:  Fair  Language:  Fair  Akathisia:  Negative  Handed:  Right  AIMS (if indicated):     Assets:  Communication Skills Desire for Improvement Financial Resources/Insurance Housing  ADL's:  Impaired  Cognition:  WNL  Sleep:  Number of Hours: 6     Treatment Plan Summary: Daily contact with patient to assess and evaluate symptoms and progress in treatment, Medication management and Plan continue medications as above. Tentative discharge tomorrow.   Salley Scarlet, MD 02/25/2020, 1:34 PM

## 2020-02-25 NOTE — Progress Notes (Signed)
Patient is cooperative with treatment. She is appropriate with staff & peers. She denies SI,HI and AVH. Compliant with medications. No behavioral issues to report on shift at this time. Support and encouragement given. Patient currently in bed resting at this time.

## 2020-02-25 NOTE — BHH Counselor (Signed)
CSW spoke with the following to arrange for home health PT:  [place], Corene Cornea, [phone] Reports that they will call back.   [place] [phone] Can not take anymore [facility] Medicare.    [place], [phone] CSW left HIPAA compliant voicemail.   [facility], [phone] CSW left HIPAA compliant voicemail.   [facility], Helene Kelp, [phone] Line rang then became a busy signal.    [facility], [place], [phone] Can not take anymore [facility] Medicare.    [facility], [place] not got into [place].   [place] [facility], [phone] Can see the patient on Friday, they will call you to set up a time .  Needs the following information demographic, order, H&P and discharge summary to [phone].  Assunta Curtis, MSW, LCSW 02/25/2020 3:29 PM

## 2020-02-26 DIAGNOSIS — K529 Noninfective gastroenteritis and colitis, unspecified: Secondary | ICD-10-CM | POA: Diagnosis not present

## 2020-02-26 DIAGNOSIS — Z85038 Personal history of other malignant neoplasm of large intestine: Secondary | ICD-10-CM | POA: Diagnosis not present

## 2020-02-26 LAB — GLUCOSE, CAPILLARY: Glucose-Capillary: 201 mg/dL — ABNORMAL HIGH (ref 70–99)

## 2020-02-26 MED ORDER — HYDROCHLOROTHIAZIDE 12.5 MG PO TABS: 12.5000 mg | ORAL_TABLET | Freq: Every day | ORAL | 1 refills | Status: DC

## 2020-02-26 MED ORDER — BUPROPION HCL ER (XL) 150 MG PO TB24: 150.0000 mg | ORAL_TABLET | Freq: Every day | ORAL | 1 refills | Status: DC

## 2020-02-26 MED ORDER — METOPROLOL SUCCINATE ER 50 MG PO TB24: 50.0000 mg | ORAL_TABLET | Freq: Every day | ORAL | 1 refills | Status: DC

## 2020-02-26 MED ORDER — COLESTIPOL HCL 1 G PO TABS
1.0000 g | ORAL_TABLET | Freq: Every day | ORAL | 1 refills | Status: DC
Start: 2020-02-27 — End: 2020-05-08

## 2020-02-26 MED ORDER — PANTOPRAZOLE SODIUM 40 MG PO TBEC: 40.0000 mg | DELAYED_RELEASE_TABLET | Freq: Two times a day (BID) | ORAL | 1 refills | Status: DC

## 2020-02-26 MED ORDER — INSULIN GLARGINE 100 UNIT/ML ~~LOC~~ SOLN: 24.0000 [IU] | Freq: Every day | SUBCUTANEOUS | 1 refills | Status: DC

## 2020-02-26 MED ORDER — FUROSEMIDE 40 MG PO TABS: 40.0000 mg | ORAL_TABLET | Freq: Every day | ORAL | 1 refills | Status: DC

## 2020-02-26 MED ORDER — GABAPENTIN 300 MG PO CAPS: 300.0000 mg | ORAL_CAPSULE | Freq: Every day | ORAL | 1 refills | Status: DC

## 2020-02-26 MED ORDER — ARIPIPRAZOLE 15 MG PO TABS: 15.0000 mg | ORAL_TABLET | Freq: Every day | ORAL | 1 refills | Status: DC

## 2020-02-26 MED ORDER — METFORMIN HCL 1000 MG PO TABS
1000.0000 mg | ORAL_TABLET | Freq: Every day | ORAL | 1 refills | Status: DC
Start: 2020-02-27 — End: 2020-06-30

## 2020-02-26 MED ORDER — TRAZODONE HCL 50 MG PO TABS: 50.0000 mg | ORAL_TABLET | Freq: Every evening | ORAL | 1 refills | Status: DC | PRN

## 2020-02-26 MED ORDER — ATORVASTATIN CALCIUM 40 MG PO TABS: 40.0000 mg | ORAL_TABLET | Freq: Every day | ORAL | 1 refills | Status: DC

## 2020-02-26 NOTE — Progress Notes (Signed)
D: Pt alert and oriented. Pt denies experiencing any pain, SI/HI, or AVH at this time. Pt reports she will be able to keep herself safe when she returns home.   A: Pt received discharge and medication education/information. Pt belongings were returned and signed for at this time to include shower bench, walker, and printed prescription.   R: Pt verbalized understanding of discharge and medication education/information.  Pt  Escorted by staff to the physician's parking where pt was picked up by safe transport and transported home.

## 2020-02-26 NOTE — Progress Notes (Signed)
D: Pt alert and oriented. Pt rates depression 0/10, hopelessness 0/10, and anxiety 0/10. Pt goal: "Focusing on goals." Pt reports energy level as normal and concentration as being good. Pt reports sleep last night as being good. Pt did not receive medications for sleep. Pt reports experiencing 5/10 bilat foot pain, prn meds given. Pt denies experiencing any SI/HI, or AVH at this time.   A: Scheduled medications administered to pt, per MD orders. Support and encouragement provided. Frequent verbal contact made. Routine safety checks conducted q15 minutes.   R: No adverse drug reactions noted. Pt verbally contracts for safety at this time. Pt complaint with medications and treatment plan. Pt interacts well with others on the unit. Pt remains safe at this time. Will continue to monitor.

## 2020-02-26 NOTE — Progress Notes (Signed)
Patient is alert and oriented x 4, affect is flat but she brightens upon approach, she appears less anxious, interacting appropriately with peers and staff, speech is soft but tangential her thoughts are organized she was offered emotional support and encouragement. Patient denies SI/HI/AVH she was complaint with medication regimen, no distress noted will continue to monitor closely.

## 2020-02-26 NOTE — Tx Team (Signed)
Interdisciplinary Treatment and Diagnostic Plan Update  02/26/2020 Time of Session: 8:30AM [name] MRN: [id number]  Principal Diagnosis: Schizophrenia [facility])  Secondary Diagnoses: Principal Problem:   Schizophrenia ([place]) Active Problems:   DM (diabetes mellitus), type 2, 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[place])   Essential hypertension   Hyperlipidemia associated with type 2 diabetes mellitus ([place])   Current Medications:  Current Facility-Administered Medications  Medication Dose Route Frequency Provider Last Rate Last Admin  . acetaminophen (TYLENOL) tablet 650 mg  650 mg Oral Q6H PRN Clapacs, Madie Ren[place], MD   650 mg at 02/26/20 0819  . alum & mag hydroxide-simeth (MAALOX/MYLANTA) 200-200-20 MG/5ML suspension 30 mL  30 mL Oral Q4H PRN Clapacs, John T, MD      . ARIPiprazole (ABILIFY) tablet 15 mg  15 mg Oral QHS Salley Scarlet, MD   15 mg at 02/25/20 2130  . aspirin EC tablet 81 mg  81 mg Oral Daily Clapacs, Madie Reno, MD   81 mg at 02/26/20 0811  . atorvastatin (LIPITOR) tablet 40 mg  40 mg Oral Daily Clapacs, Madie Reno, MD   40 mg at 02/26/20 0811  . buPROPion (WELLBUTRIN XL) 24 hr tablet 150 mg  150 mg Oral Daily Salley Scarlet, MD   150 mg at 02/26/20 0811  . colestipol (COLESTID) tablet 1 g  1 g Oral Daily Clapacs, Madie Reno, MD   1 g at 02/26/20 0811  . ferrous sulfate tablet 325 mg  325 mg Oral Daily Clapacs, Madie Reno, MD   325 mg at 02/26/20 0811  . furosemide (LASIX) tablet 40 mg  40 mg Oral Daily Clapacs, Madie Reno, MD   40 mg at 02/26/20 2542  . gabapentin (NEURONTIN) capsule 300 mg  300 mg Oral QHS Clapacs, John T, MD   300 mg at 02/24/20 2128  . hydrochlorothiazide (HYDRODIURIL) tablet 12.5 mg  12.5 mg Oral Daily Clapacs, Madie Reno, MD   12.5 mg at 02/26/20 7062  . insulin glargine (LANTUS) injection 24 Units  24 Units Subcutaneous Daily Clapacs, Madie Reno, MD   24 Units at 02/26/20 [phone]  . magnesium hydroxide (MILK OF MAGNESIA) suspension 30 mL  30 mL Oral Daily PRN Clapacs,  John T, MD      . metFORMIN (GLUCOPHAGE) tablet 1,000 mg  1,000 mg Oral Q breakfast Clapacs, Madie Reno, MD   1,000 mg at 02/26/20 8315  . metoprolol succinate (TOPROL-XL) 24 hr tablet 50 mg  50 mg Oral Daily Clapacs, Madie Reno, MD   50 mg at 02/26/20 1761  . ondansetron (ZOFRAN) tablet 4 mg  4 mg Oral Q8H PRN Clapacs, John T, MD      . pantoprazole (PROTONIX) EC tablet 40 mg  40 mg Oral BID AC Clapacs, Madie Reno, MD   40 mg at 02/26/20 0811  . traZODone (DESYREL) tablet 50 mg  50 mg Oral QHS PRN Clapacs, Madie Reno, MD   50 mg at 02/25/20 2130   PTA Medications: Medications Prior to Admission  Medication Sig Dispense Refill Last Dose  . acetaminophen (TYLENOL) 325 MG tablet Take 2 tablets (650 mg total) by mouth every 6 (six) hours as needed for mild pain.     . ARIPiprazole (ABILIFY) 10 MG tablet TAKE 1 TABLET BY MOUTH AT BEDTIME. 30 tablet 0   . aspirin EC 81 MG tablet Take 1 tablet (81 mg total) by mouth daily. (Patient not taking: Reported on 02/19/2020) 90 tablet 3   . Blood Glucose Monitoring Suppl (ONETOUCH VERIO) w/Device  KIT Use to check blood sugar up to 3 x daily 1 kit 0   . buPROPion (WELLBUTRIN XL) 300 MG 24 hr tablet TAKE 1 TABLET BY MOUTH EACH MORNING. (Patient taking differently: Take 300 mg by mouth daily. ) 30 tablet 0   . colestipol (COLESTID) 1 g tablet Take 2 tablets (2 g total) by mouth 2 (two) times daily as needed (diarrhea). Take only if having diarrhea, may not need to take regularly. (Patient not taking: Reported on 02/19/2020) 120 tablet 2   . Ferrous Sulfate 27 MG TABS Take 27 mg by mouth daily.      . insulin glargine (LANTUS) 100 UNIT/ML Solostar Pen Inject 24 Units into the skin daily. Each morning     . Lancets (ONETOUCH ULTRASOFT) lancets Use to check blood sugar up to 3 x daily 100 each 12   . metFORMIN (GLUCOPHAGE) 1000 MG tablet Take 1,000 mg by mouth daily.      . ondansetron (ZOFRAN) 4 MG tablet Take 1 tablet (4 mg total) by mouth every 6 (six) hours. (Patient taking  differently: Take 4 mg by mouth every 8 (eight) hours as needed for nausea or vomiting. ) 12 tablet 0   . ONETOUCH VERIO test strip Check blood sugar up to 5 x daily as needed 450 each 3   . vitamin B-12 1000 MCG tablet Take 1 tablet (1,000 mcg total) by mouth daily.     . [DISCONTINUED] atorvastatin (LIPITOR) 40 MG tablet Take 1 tablet (40 mg total) by mouth daily. (Patient not taking: Reported on 02/19/2020) 90 tablet 3   . [DISCONTINUED] furosemide (LASIX) 40 MG tablet Take 1 tablet (40 mg total) by mouth daily. 90 tablet 3   . [DISCONTINUED] gabapentin (NEURONTIN) 300 MG capsule Take 1 capsule (300 mg total) by mouth at bedtime. (Patient not taking: Reported on 02/19/2020) 90 capsule 3   . [DISCONTINUED] hydrochlorothiazide (HYDRODIURIL) 12.5 MG tablet Take 1 tablet (12.5 mg total) by mouth daily. 90 tablet 3   . [DISCONTINUED] metoprolol succinate (TOPROL-XL) 50 MG 24 hr tablet Take 1 tablet (50 mg total) by mouth daily. Take with or immediately following a meal. 90 tablet 3   . [DISCONTINUED] pantoprazole (PROTONIX) 40 MG tablet Take 1 tablet (40 mg total) by mouth 2 (two) times daily before a meal. 180 tablet 3   . [DISCONTINUED] traZODone (DESYREL) 50 MG tablet TAKE 1 TABLET BY MOUTH AT BEDTIME AS NEEDED FOR SLEEP. (Patient not taking: Reported on 02/19/2020) 30 tablet 2     Patient Stressors: Health problems Marital or family conflict Medication change or noncompliance  Patient Strengths: Average or above average intelligence Communication skills Motivation for treatment/growth  Treatment Modalities: Medication Management, Group therapy, Case management,  1 to 1 session with clinician, Psychoeducation, Recreational therapy.   Physician Treatment Plan for Primary Diagnosis: Schizophrenia ([place]) Long Term Goal(s): Improvement in symptoms so as ready for discharge Improvement in symptoms so as ready for discharge   Short Term Goals: Ability to identify changes in lifestyle to reduce  recurrence of condition will improve Ability to verbalize feelings will improve Ability to disclose and discuss suicidal ideas Ability to demonstrate self-control will improve Ability to identify and develop effective coping behaviors will improve Compliance with prescribed medications will improve Ability to identify changes in lifestyle to reduce recurrence of condition will improve Ability to verbalize feelings will improve Ability to disclose and discuss suicidal ideas Ability to demonstrate self-control will improve Ability to identify and develop effective coping behaviors  will improve Compliance with prescribed medications will improve  Medication Management: Evaluate patient's response, side effects, and tolerance of medication regimen.  Therapeutic Interventions: 1 to 1 sessions, Unit Group sessions and Medication administration.  Evaluation of Outcomes: Adequate for Discharge  Physician Treatment Plan for Secondary Diagnosis: Principal Problem:   Schizophrenia ([place]) Active Problems:   DM (diabetes mellitus), type 2, 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[place])   Essential hypertension   Hyperlipidemia associated with type 2 diabetes mellitus ([place])  Long Term Goal(s): Improvement in symptoms so as ready for discharge Improvement in symptoms so as ready for discharge   Short Term Goals: Ability to identify changes in lifestyle to reduce recurrence of condition will improve Ability to verbalize feelings will improve Ability to disclose and discuss suicidal ideas Ability to demonstrate self-control will improve Ability to identify and develop effective coping behaviors will improve Compliance with prescribed medications will improve Ability to identify changes in lifestyle to reduce recurrence of condition will improve Ability to verbalize feelings will improve Ability to disclose and discuss suicidal ideas Ability to demonstrate self-control will improve Ability to  identify and develop effective coping behaviors will improve Compliance with prescribed medications will improve     Medication Management: Evaluate patient's response, side effects, and tolerance of medication regimen.  Therapeutic Interventions: 1 to 1 sessions, Unit Group sessions and Medication administration.  Evaluation of Outcomes: Adequate for Discharge   RN Treatment Plan for Primary Diagnosis: Schizophrenia ([place]) Long Term Goal(s): Knowledge of disease and therapeutic regimen to maintain health will improve  Short Term Goals: Ability to demonstrate self-control, Ability to verbalize feelings will improve, Ability to disclose and discuss suicidal ideas, Ability to identify and develop effective coping behaviors will improve and Compliance with prescribed medications will improve  Medication Management: RN will administer medications as ordered by provider, will assess and evaluate patient's response and provide education to patient for prescribed medication. RN will report any adverse and/or side effects to prescribing provider.  Therapeutic Interventions: 1 on 1 counseling sessions, Psychoeducation, Medication administration, Evaluate responses to treatment, Monitor vital signs and CBGs as ordered, Perform/monitor CIWA, COWS, AIMS and Fall Risk screenings as ordered, Perform wound care treatments as ordered.  Evaluation of Outcomes: Adequate for Discharge   LCSW Treatment Plan for Primary Diagnosis: Schizophrenia [facility]) Long Term Goal(s): Safe transition to appropriate next level of care at discharge, Engage patient in therapeutic group addressing interpersonal concerns.  Short Term Goals: Engage patient in aftercare planning with referrals and resources, Increase social support, Increase ability to appropriately verbalize feelings, Increase emotional regulation, Facilitate acceptance of mental health diagnosis and concerns and Increase skills for wellness and  recovery  Therapeutic Interventions: Assess for all discharge needs, 1 to 1 time with Social worker, Explore available resources and support systems, Assess for adequacy in community support network, Educate family and significant other(s) on suicide prevention, Complete Psychosocial Assessment, Interpersonal group therapy.  Evaluation of Outcomes: Adequate for Discharge   Progress in Treatment: Attending groups: Yes. Participating in groups: Yes. Taking medication as prescribed: Yes. Toleration medication: Yes. Family/Significant other contact made: No, will contact:  once permission is given. Patient understands diagnosis: Yes. Discussing patient identified problems/goals with staff: Yes. Medical problems stabilized or resolved: Yes. Denies suicidal/homicidal ideation: Yes. Issues/concerns per patient self-inventory: No. Other: none  New problem(s) identified: No, Describe:  none  New Short Term/Long Term Goal(s): detox, elimination of symptoms of psychosis, medication management for mood stabilization; elimination of SI thoughts; development of comprehensive mental wellness/sobriety plan. Update 02/26/20:  No changes at this time.   Patient Goals:  "back on my meds and a little bit stronger" Update 02/26/20: Pt feels she has met this goal.  Discharge Plan or Barriers: CSW will assist patient in developing aftercare plans. Update 02/26/20: Pt is returning home with aftercare.    Reason for Continuation of Hospitalization: Anxiety Depression Medication stabilization  Estimated Length of Stay:  1-7 days  Recreational Therapy: Patient Stressors: N/A Patient Goal: Patient will engage in groups without prompting or encouragement from LRT x3 group sessions within 5 recreation therapy group sessions.    Attendees: Patient: 02/26/2020 3:21 PM  Physician: Dr. Domingo Cocking, MD 02/26/2020 3:21 PM  Nursing:  02/26/2020 3:21 PM  RN Care Manager: 02/26/2020 3:21 PM  Social Worker:  Assunta Curtis, LCSW 02/26/2020 3:21 PM  Recreational Therapist:  02/26/2020 3:21 PM  Other: Gwynneth Munson" [place], [place] 02/26/2020 3:21 PM  Other:  02/26/2020 3:21 PM  Other: 02/26/2020 3:21 PM    Scribe for Treatment Team: Shirl Harris, LCSW 02/26/2020 3:21 PM

## 2020-02-26 NOTE — [facility] Counselor (Signed)
CSW contacted the following in an effort to set the patient up for home health PT:  [facility], Tamekia Rotter, [phone] CSW left HIPAA compliant voicemail.  Valetta Fuller, [phone] Referred to Felipe Drone, [place] notes that she spoke with Corene Cornea on 02/25/2020.  He declined patient.  [place], [phone] CSW left HIPAA compliant voicemail.  Assunta Curtis, MSW, LCSW 02/26/2020 10:34 AM

## 2020-02-26 NOTE — Discharge Summary (Signed)
Physician Discharge Summary Note  Patient:  [name] is an 54 y.o., female MRN:  [id number] DOB:  24-Jul-1965 Patient phone:  [phone] (home)  Patient address:   [address] [place] [address],  Total Time spent with patient: 45 minutes  Date of Admission:  02/20/2020 Date of Discharge: 02/26/2020   Reason for Admission:  Manic-like behavior in setting of medication non-compliance. Not taking care of self, increased sexual activity, and giving money away to men on the internet.   Principal Problem: Schizophrenia [facility]) Discharge Diagnoses: Principal Problem:   Schizophrenia ([place]) Active Problems:   DM (diabetes mellitus), type 2, 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[place])   Essential hypertension   Hyperlipidemia associated with type 2 diabetes mellitus ([place])   Past Psychiatric History: Pt has a history of Schizoaffective Disorder, Bipolar I type, and that Pt receives outpatient psychiatric services for this disorder through [facility]. Daughter reported that she went through [name] yesterday and saw that Pt has not taken any psychotropic medication since July 2021 (as evidenced by presence of unused bubble-wrap medication containers). Daughter also reported that Pt has expressed suicidal ideation since June or July and has threatened to overdose. Daughter also stated that since May, Pt has engaged in potentially dangerous behavior -- per daughter, [name] has contacted daughter and police about Pt receiving numerous female visitors at night, about Pt greeting visitors in the nude, and Pt giving her money to scammers.  Past Medical History:  Past Medical History:  Diagnosis Date  . Charcot's joint of foot, left   . Depression   . Diabetes mellitus without complication ([place])   . Hyperlipidemia   . Hypertension   . Neuromuscular disorder ([place])   . Schizo affective schizophrenia ([place])    abilify and pazil    Past Surgical History:  Procedure Laterality  Date  . CHOLECYSTECTOMY    . COLONOSCOPY WITH PROPOFOL N/A 05/26/2019   Procedure: COLONOSCOPY WITH PROPOFOL;  Surgeon: Toledo, Benay Pike, MD;  Location: [facility] ENDOSCOPY;  Service: Gastroenterology;  Laterality: N/A;  . ESOPHAGOGASTRODUODENOSCOPY (EGD) WITH PROPOFOL N/A 05/26/2019   Procedure: ESOPHAGOGASTRODUODENOSCOPY (EGD) WITH PROPOFOL;  Surgeon: Toledo, Benay Pike, MD;  Location: [facility] ENDOSCOPY;  Service: Gastroenterology;  Laterality: N/A;  . IRRIGATION AND DEBRIDEMENT FOOT Right 02/26/2016   Procedure: RIGHT 2ND TOE DEBRIDEMENT;  Surgeon: Samara Deist, DPM;  Location: [facility] ORS;  Service: Podiatry;  Laterality: Right;  . IRRIGATION AND DEBRIDEMENT FOOT Left 12/13/2018   Procedure: IRRIGATION AND DEBRIDEMENT FOOT;  Surgeon: Caroline More, DPM;  Location: [facility] ORS;  Service: Podiatry;  Laterality: Left;   Family History:  Family History  Problem Relation Age of Onset  . Breast cancer Cousin        maternal cousin  . Hypertension Mother   . Hyperthyroidism Mother   . Dementia Mother   . Prostate cancer Father   . Diabetes Maternal Grandmother   . Hypertension Maternal Grandmother   . Cancer Maternal Grandmother        unknown type  . Diabetes Maternal Grandfather   . Hypertension Maternal Grandfather   . Diabetes Paternal Grandmother   . Hypertension Paternal Grandmother   . Diabetes Paternal Grandfather   . Hypertension Paternal Grandfather    Family Psychiatric  History: Denies Social History:  Social History   Substance and Sexual Activity  Alcohol Use No     Social History   Substance and Sexual Activity  Drug Use No    Social History   Socioeconomic History  .  Marital status: Divorced    Spouse name: Not on file  . Number of children: 2  . Years of education: Not on file  . Highest education level: Not on file  Occupational History  . Not on file  Tobacco Use  . Smoking status: Former Smoker    Packs/day: 3.00    Years: 30.00    Pack years: 90.00     Types: Cigarettes    Quit date: 05/02/2009    Years since quitting: 10.8  . Smokeless tobacco: Never Used  . Tobacco comment: Pt reported  quitting in 2011  Vaping Use  . Vaping Use: Never used  Substance and Sexual Activity  . Alcohol use: No  . Drug use: No  . Sexual activity: Not Currently  Other Topics Concern  . Not on file  Social History Narrative   At home with mom, 72. Lives in brothers house who moved out. Sister comes by every other day to check on patient and mom.   Social Determinants of Health   Financial Resource Strain: Low Risk   . Difficulty of Paying Living Expenses: Not hard at all  Food Insecurity: No Food Insecurity  . Worried About Charity fundraiser in the Last Year: Never true  . Ran Out of Food in the Last Year: Never true  Transportation Needs: No Transportation Needs  . Lack of Transportation (Medical): No  . Lack of Transportation (Non-Medical): No  Physical Activity: Inactive  . Days of Exercise per Week: 0 days  . Minutes of Exercise per Session: 0 min  Stress: No Stress Concern Present  . Feeling of Stress : Only a little  Social Connections: Moderately Isolated  . Frequency of Communication with Friends and Family: More than three times a week  . Frequency of Social Gatherings with Friends and Family: More than three times a week  . Attends Religious Services: More than 4 times per year  . Active Member of Clubs or Organizations: No  . Attends Archivist Meetings: Never  . Marital Status: Divorced    Hospital Course:  Patient admitted for manic-like behavior in setting of medication noncompliance. She had been off medicines for several months, and notes she was making "bad decisions." She relays that she was opening bank accounts with men she met on the internet, and was giving away her money. She also had multiple sexual relations that were out of character for her. She simultaneously began to not care for herself, and began using a  wheelchair to get around. While she was admitted she was restarted on her home medications. Abilify was increased to 15 mg daily, and wellbutrin was decreased to 150 mg daily. Her mood gradually improved, and she became more forward thinking. She was also seen by physical therapy who recommends continued outpatient PT for deconditioning. At time of discharge she denied SI/HI/AH/VH. She felt she was safe to discharge home with outpatient follow-up. Her plan is to continue her medications as prescribe, start seeing a therapist, and avoid men in general both online and in person until she can work on her own mental health. She produced a to-do list to getting her apartment back in order as well as a grocery store list. At time of discharge patient and treatment team felt she was safe to discharge home with follow-up.   Physical Findings: AIMS: Facial and Oral Movements Muscles of Facial Expression: None, normal Lips and Perioral Area: None, normal Jaw: None, normal Tongue: None, normal,Extremity Movements Upper (  arms, wrists, hands, fingers): None, normal Lower (legs, knees, ankles, toes): None, normal, Trunk Movements Neck, shoulders, hips: None, normal, Overall Severity Severity of abnormal movements (highest score from questions above): None, normal Incapacitation due to abnormal movements: None, normal Patient's awareness of abnormal movements (rate only patient's report): No Awareness, Dental Status Current problems with teeth and/or dentures?: No Does patient usually wear dentures?: No  CIWA:    COWS:     Musculoskeletal: Strength & Muscle Tone: decreased Gait & Station: using a wheelchair Patient leans: N/A  Psychiatric Specialty Exam: Physical Exam Vitals and nursing note reviewed.  Constitutional:      Appearance: Normal appearance.  HENT:     Head: Normocephalic and atraumatic.     Right Ear: External ear normal.     Left Ear: External ear normal.     Nose: Nose normal.      Mouth/Throat:     Mouth: Mucous membranes are moist.     Pharynx: Oropharynx is clear.  Eyes:     Extraocular Movements: Extraocular movements intact.     Conjunctiva/sclera: Conjunctivae normal.     Pupils: Pupils are equal, round, and reactive to light.  Cardiovascular:     Rate and Rhythm: Normal rate.     Pulses: Normal pulses.  Pulmonary:     Effort: Pulmonary effort is normal.     Breath sounds: Normal breath sounds.  Abdominal:     General: Abdomen is flat.     Palpations: Abdomen is soft.  Musculoskeletal:        General: No swelling. Normal range of motion.     Cervical back: Normal range of motion and neck supple.  Skin:    General: Skin is warm and dry.  Neurological:     General: No focal deficit present.     Mental Status: She is alert and oriented to person, place, and time.  Psychiatric:        Mood and Affect: Mood normal.        Behavior: Behavior normal.        Thought Content: Thought content normal.        Judgment: Judgment normal.     Review of Systems  Blood pressure 126/72, pulse 88, temperature 97.6 F (36.4 C), temperature source Oral, resp. rate 18, height _0  (1.778 m), weight 106.6 kg, SpO2 100 %.Body mass index is 33.72 kg/m.  General Appearance: Fairly Groomed  Engineer, water::  Good  Speech:  Clear and Coherent409  Volume:  Normal  Mood:  Euthymic  Affect:  Congruent  Thought Process:  Coherent and Irrelevant  Orientation:  Full (Time, Place, and Person)  Thought Content:  Logical  Suicidal Thoughts:  No  Homicidal Thoughts:  No  Memory:  Immediate;   Good Recent;   Good Remote;   Good  Judgement:  Good  Insight:  Good  Psychomotor Activity:  Normal  Concentration:  Good  Recall:  [place] of Knowledge:Good  Language: Good  Akathisia:  Negative  Handed:  Right  AIMS (if indicated):     Assets:  Communication Skills Desire for Improvement Financial Resources/Insurance Housing Resilience Social Support  Sleep:  Number of  Hours: 7.75  Cognition: WNL  ADL's:  Intact        Have you used any form of tobacco in the last 30 days? (Cigarettes, Smokeless Tobacco, Cigars, and/or Pipes): No  Has this patient used any form of tobacco in the last 30 days? (Cigarettes, Smokeless Tobacco, Cigars, and/or  Pipes)  No  Blood Alcohol level:  Lab Results  Component Value Date   ETH <10 02/18/2020   ETH <10 58/59/2924    Metabolic Disorder Labs:  Lab Results  Component Value Date   HGBA1C 8.8 (H) 12/05/2019   MPG 205.86 12/05/2019   MPG 197.25 05/23/2019   No results found for: PROLACTIN Lab Results  Component Value Date   CHOL 175 02/23/2020   TRIG 275 (H) 02/23/2020   HDL 42 02/23/2020   CHOLHDL 4.2 02/23/2020   VLDL 55 (H) 02/23/2020   LDLCALC 78 02/23/2020   LDLCALC 135 (H) 11/07/2017    See Psychiatric Specialty Exam and Suicide Risk Assessment completed by Attending Physician prior to discharge.  Discharge destination:  Home  Is patient on multiple antipsychotic therapies at discharge:  No   Has Patient had three or more failed trials of antipsychotic monotherapy by history:  No  Recommended Plan for Multiple Antipsychotic Therapies: NA  Discharge Instructions    Diet Carb Modified   Complete by: As directed    Increase activity slowly   Complete by: As directed      Allergies as of 02/26/2020   No Known Allergies     Medication List    STOP taking these medications   insulin glargine 100 UNIT/ML Solostar Pen Commonly known as: LANTUS Replaced by: insulin glargine 100 UNIT/ML injection     TAKE these medications     Indication  acetaminophen 325 MG tablet Commonly known as: TYLENOL Take 2 tablets (650 mg total) by mouth every 6 (six) hours as needed for mild pain.  Indication: Pain   ARIPiprazole 15 MG tablet Commonly known as: ABILIFY Take 1 tablet (15 mg total) by mouth at bedtime. What changed:   medication strength  how much to take  Indication: Manic Phase of  Manic-Depression   aspirin EC 81 MG tablet Take 1 tablet (81 mg total) by mouth daily.  Indication: prevention of cardiac events   atorvastatin 40 MG tablet Commonly known as: LIPITOR Take 1 tablet (40 mg total) by mouth daily.  Indication: High Amount of Fats in the Blood   buPROPion 150 MG 24 hr tablet Commonly known as: WELLBUTRIN XL Take 1 tablet (150 mg total) by mouth daily. Start taking on: February 27, 2020 What changed:   medication strength  See the new instructions.  Indication: Major Depressive Disorder   colestipol 1 g tablet Commonly known as: COLESTID Take 1 tablet (1 g total) by mouth daily. Start taking on: February 27, 2020 What changed:   how much to take  when to take this  reasons to take this  additional instructions  Indication: High Amount of Cholesterol in the Blood   cyanocobalamin 1000 MCG tablet Take 1 tablet (1,000 mcg total) by mouth daily.  Indication: Inadequate Vitamin B12   Ferrous Sulfate 27 MG Tabs Take 27 mg by mouth daily.  Indication: Iron Deficiency   furosemide 40 MG tablet Commonly known as: Lasix Take 1 tablet (40 mg total) by mouth daily.  Indication: High Blood Pressure Disorder   gabapentin 300 MG capsule Commonly known as: NEURONTIN Take 1 capsule (300 mg total) by mouth at bedtime.  Indication: Neuropathic Pain   hydrochlorothiazide 12.5 MG tablet Commonly known as: HYDRODIURIL Take 1 tablet (12.5 mg total) by mouth daily.  Indication: High Blood Pressure Disorder   insulin glargine 100 UNIT/ML injection Commonly known as: LANTUS Inject 0.24 mLs (24 Units total) into the skin daily. Start taking on: February 27, 2020 Replaces: insulin glargine 100 UNIT/ML Solostar Pen  Indication: Type 2 Diabetes   metFORMIN 1000 MG tablet Commonly known as: GLUCOPHAGE Take 1 tablet (1,000 mg total) by mouth daily with breakfast. Start taking on: February 27, 2020 What changed: when to take this  Indication: Type 2  Diabetes   metoprolol succinate 50 MG 24 hr tablet Commonly known as: TOPROL-XL Take 1 tablet (50 mg total) by mouth daily. Take with or immediately following a meal.  Indication: High Blood Pressure Disorder   ondansetron 4 MG tablet Commonly known as: ZOFRAN Take 1 tablet (4 mg total) by mouth every 6 (six) hours. What changed:   when to take this  reasons to take this  Indication: Nausea and Vomiting   onetouch ultrasoft lancets Use to check blood sugar up to 3 x daily  Indication: Diabetes   OneTouch Verio test strip Generic drug: glucose blood Check blood sugar up to 5 x daily as needed  Indication: Diabetes   OneTouch Verio w/Device Kit Use to check blood sugar up to 3 x daily  Indication: Diabetes   pantoprazole 40 MG tablet Commonly known as: PROTONIX Take 1 tablet (40 mg total) by mouth 2 (two) times daily before a meal.  Indication: Gastroesophageal Reflux Disease   traZODone 50 MG tablet Commonly known as: DESYREL Take 1 tablet (50 mg total) by mouth at bedtime as needed. for sleep  Indication: [place]  (From admission, onward)         Start     Ordered   02/25/20 1525  For home use only DME 4 wheeled rolling walker with seat  Once       Question:  Patient needs a walker to treat with the following condition  Answer:  Physical deconditioning   02/25/20 1525   02/25/20 1525  For home use only DME Tub bench  Once        02/25/20 1525          Follow-up Information    [facility], [facility]. Go on 02/27/2020.   Why: Please follow up with your appointment on 02/27/2020 at 10:00AM.  Please bring to the appointment your ID, Social Security  card and insurance card.  Thanks! Contact information: [place] [address] [phone]               Follow-up recommendations:  Activity:  as tolerated Diet:  carb controlled, diabetic diet  Comments:  30 day scripts with 1  refill were printed and provided to patient. A walker and shower chair were also ordered and provided to patient at discharge. Referral for outpatient physical therapy sent.   Signed: Salley Scarlet, MD 02/26/2020, 12:02 PM

## 2020-02-26 NOTE — Progress Notes (Signed)
  [facility] Adult Case Management Discharge Plan :  Will you be returning to the same living situation after discharge:  Yes,  pt reports that she is returning home. At discharge, do you have transportation home?: Yes,  CSW will assist. Do you have the ability to pay for your medications: Yes,  [employer] Medicare, [employer]  Release of information consent forms completed and in the chart;  Patient's signature needed at discharge.  Patient to Follow up at:  Follow-up Information    [facility]. Go on 02/27/2020.   Why: Please follow up with your appointment on 02/27/2020 at 10:00AM.  Please bring to the appointment your ID, Social Security  card and insurance card.  Thanks! Contact information: [place] [address] [phone]               Next level of care provider has access to [facility] and Suicide Prevention discussed: Yes,  SPE attempts were made.  Have you used any form of tobacco in the last 30 days? (Cigarettes, Smokeless Tobacco, Cigars, and/or Pipes): No  Has patient been referred to the Quitline?: Patient refused referral  Patient has been referred for addiction treatment: Pt. refused referral  Rozann Lesches, LCSW 02/26/2020, 10:42 AM

## 2020-02-26 NOTE — BHH Counselor (Signed)
CSW received call from [facility], Aliyanah Rozas, [phone] who reports that they can not take the patient at this time due to being at capacity.  Assunta Curtis, MSW, LCSW 02/26/2020 11:05 AM

## 2020-02-26 NOTE — BHH Counselor (Signed)
CSW checked voicemail to discover that [facility], [phone], declined the patient due to accept because she resides outside of their catchment area.  Assunta Curtis, MSW, LCSW 02/26/2020 10:36 AM

## 2020-02-26 NOTE — BHH Suicide Risk Assessment (Signed)
[facility] Discharge Suicide Risk Assessment   Principal Problem: Schizophrenia [facility]) Discharge Diagnoses: Principal Problem:   Schizophrenia ([place]) Active Problems:   DM (diabetes mellitus), type 2, 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[place])   Essential hypertension   Hyperlipidemia associated with type 2 diabetes mellitus ([place])   Total Time spent with patient: 45 minutes  Musculoskeletal: Strength & Muscle Tone: decreased Gait & Station: using wheelchair this morning, can ambulate with walker Patient leans: N/A  Psychiatric Specialty Exam: Review of Systems  Constitutional: Negative for fatigue and fever.  HENT: Negative for rhinorrhea and sore throat.   Eyes: Negative for photophobia.  Respiratory: Negative for cough and shortness of breath.   Cardiovascular: Negative for chest pain and palpitations.  Gastrointestinal: Negative for constipation, diarrhea, nausea and vomiting.  Endocrine: Negative for cold intolerance and heat intolerance.  Genitourinary: Negative for difficulty urinating and dyspareunia.  Musculoskeletal: Positive for arthralgias and myalgias.  Skin: Negative for rash and wound.  Allergic/Immunologic: Negative for environmental allergies and food allergies.  Neurological: Negative for dizziness and headaches.  Hematological: Negative for adenopathy. Does not bruise/bleed easily.  Psychiatric/Behavioral: Negative for dysphoric mood, hallucinations and suicidal ideas. The patient is not nervous/anxious.     Blood pressure 126/72, pulse 88, temperature 97.6 F (36.4 C), temperature source Oral, resp. rate 18, height 5\' 10"  (1.778 m), weight 106.6 kg, SpO2 100 %.Body mass index is 33.72 kg/m.  General Appearance: Fairly Groomed  Engineer, water::  Good  Speech:  Clear and Coherent409  Volume:  Normal  Mood:  Euthymic  Affect:  Congruent  Thought Process:  Coherent and Irrelevant  Orientation:  Full (Time, Place, and Person)  Thought Content:  Logical  Suicidal  Thoughts:  No  Homicidal Thoughts:  No  Memory:  Immediate;   Good Recent;   Good Remote;   Good  Judgement:  Good  Insight:  Good  Psychomotor Activity:  Normal  Concentration:  Good  Recall:  [place] of Knowledge:Good  Language: Good  Akathisia:  Negative  Handed:  Right  AIMS (if indicated):     Assets:  Communication Skills Desire for Improvement Financial Resources/Insurance Housing Resilience Social Support  Sleep:  Number of Hours: 7.75  Cognition: WNL  ADL's:  Intact   Mental Status Per Nursing Assessment::   On Admission:  NA  Demographic Factors:  Caucasian and Living alone  Loss Factors: NA  Historical Factors: Impulsivity  Risk Reduction Factors:   Sense of responsibility to family, Religious beliefs about death, Positive social support, Positive therapeutic relationship and Positive coping skills or problem solving skills  Continued Clinical Symptoms:  Previous Psychiatric Diagnoses and Treatments Medical Diagnoses and Treatments/Surgeries  Cognitive Features That Contribute To Risk:  None    Suicide Risk:  Minimal: No identifiable suicidal ideation.  Patients presenting with no risk factors but with morbid ruminations; may be classified as minimal risk based on the severity of the depressive symptoms   Follow-up Information    [facility]. Go on 02/27/2020.   Why: Please follow up with your appointment on 02/27/2020 at 10:00AM.  Please bring to the appointment your ID, Social Security  card and insurance card.  Thanks! Contact information: [place] [address] [phone]               Plan Of Care/Follow-up recommendations:  Activity:  as tolerated Diet:  carb-controlled, diabetic diet  Salley Scarlet, MD 02/26/2020, 11:52 AM

## 2020-02-26 NOTE — BHH Group Notes (Signed)
LCSW Group Therapy Note  02/26/2020 4:14 PM  Type of Therapy/Topic:  Group Therapy:  Emotion Regulation  Participation Level:  None   Description of Group:   The purpose of this group is to assist patients in learning to regulate negative emotions and experience positive emotions. Patients will be guided to discuss ways in which they have been vulnerable to their negative emotions. These vulnerabilities will be juxtaposed with experiences of positive emotions or situations, and patients will be challenged to use positive emotions to combat negative ones. Special emphasis will be placed on coping with negative emotions in conflict situations, and patients will process healthy conflict resolution skills.  Therapeutic Goals: 1. Patient will identify two positive emotions or experiences to reflect on in order to balance out negative emotions 2. Patient will label two or more emotions that they find the most difficult to experience 3. Patient will demonstrate positive conflict resolution skills through discussion and/or role plays  Summary of Patient Progress: Patient showed up at the end of class.   Therapeutic Modalities:   Cognitive Behavioral Therapy Feelings Identification Dialectical Behavioral Therapy  [name], MSW, LCSW, [place] 02/26/2020 4:14 PM

## 2020-02-26 NOTE — BHH Counselor (Signed)
CSW spoke with [facility], [place], [phone].  They declined the patient at this time.   Assunta Curtis, MSW, LCSW 02/26/2020 2:47 PM

## 2020-02-26 NOTE — BHH Counselor (Signed)
CSW faxed referral to [facility].  Fax was successful.  Assunta Curtis, MSW, LCSW 02/26/2020 1:44 PM

## 2020-02-26 NOTE — Progress Notes (Signed)
Recreation Therapy Notes   Date: 02/26/2020  Time: 9:30 am   Location: Craft room   Behavioral response: Appropriate  Intervention Topic: Happiness   Discussion/Intervention:  Group content today was focused on Happiness. The group defined happiness and described where happiness comes from. Individuals identified what makes them happy and how they go about making others happy. Patients expressed things that stop them from being happy and ways they can improve their happiness. The group stated reasons why it is important to be happy. The group participated in the intervention "My Happiness", where they had a chance to identify and express things that make them happy.  Clinical Observations/Feedback: Patient came to group late due unknown reasons and was focused on what peers and staff had to say about happiness. Individual was social with peers and staff while participating in the intervention.  Tayanna Talford LRT/CTRS          Rosita Guzzetta 02/26/2020 1:09 PM

## 2020-02-27 NOTE — BHH Counselor (Signed)
CSW received fax that previous referral for PT was not accepted.  CSW faxed the referrals to the following:  [place] [facility], [place] office [facility] And all three [place] locations  Assunta Curtis, MSW, [place] 02/27/2020 4:00 PM

## 2020-02-28 ENCOUNTER — Telehealth: Payer: Self-pay

## 2020-02-28 DIAGNOSIS — R531 Weakness: Secondary | ICD-10-CM

## 2020-02-28 NOTE — Telephone Encounter (Signed)
Copied from [place] [phone]. Topic: General - Other >> Feb 27, 2020  4:58 PM Rainey Pines A wrote: [facility] called to inform in regards to patients referral that they do not offer OT or speech therapy and will not be able to assist patient. Please advise

## 2020-02-28 NOTE — Telephone Encounter (Signed)
Copied from [place] [phone]. Topic: Referral - Status >> Feb 28, 2020 11:52 AM Erick Blinks wrote: Best contact [phone] Hailey calling from [facility] in [place] needs additional information to process referral.

## 2020-03-03 ENCOUNTER — Emergency Department (HOSPITAL_COMMUNITY)
Admission: EM | Admit: 2020-03-03 | Discharge: 2020-03-04 | Disposition: A | Discharge status: Home / Self Care | Payer: Medicare Other | Attending: Emergency Medicine | Admitting: Emergency Medicine

## 2020-03-03 ENCOUNTER — Other Ambulatory Visit: Payer: Self-pay

## 2020-03-03 ENCOUNTER — Encounter (HOSPITAL_COMMUNITY): Payer: Self-pay | Admitting: Emergency Medicine

## 2020-03-03 DIAGNOSIS — R45851 Suicidal ideations: Secondary | ICD-10-CM | POA: Insufficient documentation

## 2020-03-03 DIAGNOSIS — E1129 Type 2 diabetes mellitus with other diabetic kidney complication: Secondary | ICD-10-CM | POA: Insufficient documentation

## 2020-03-03 DIAGNOSIS — Z85038 Personal history of other malignant neoplasm of large intestine: Secondary | ICD-10-CM | POA: Diagnosis not present

## 2020-03-03 DIAGNOSIS — Z87891 Personal history of nicotine dependence: Secondary | ICD-10-CM | POA: Insufficient documentation

## 2020-03-03 DIAGNOSIS — Z794 Long term (current) use of insulin: Secondary | ICD-10-CM | POA: Diagnosis not present

## 2020-03-03 DIAGNOSIS — Z79899 Other long term (current) drug therapy: Secondary | ICD-10-CM | POA: Insufficient documentation

## 2020-03-03 DIAGNOSIS — Z20822 Contact with and (suspected) exposure to covid-19: Secondary | ICD-10-CM | POA: Insufficient documentation

## 2020-03-03 DIAGNOSIS — F309 Manic episode, unspecified: Secondary | ICD-10-CM | POA: Diagnosis present

## 2020-03-03 DIAGNOSIS — Z7984 Long term (current) use of oral hypoglycemic drugs: Secondary | ICD-10-CM | POA: Diagnosis not present

## 2020-03-03 DIAGNOSIS — E1169 Type 2 diabetes mellitus with other specified complication: Secondary | ICD-10-CM | POA: Diagnosis not present

## 2020-03-03 DIAGNOSIS — E1165 Type 2 diabetes mellitus with hyperglycemia: Secondary | ICD-10-CM | POA: Diagnosis not present

## 2020-03-03 DIAGNOSIS — E1142 Type 2 diabetes mellitus with diabetic polyneuropathy: Secondary | ICD-10-CM | POA: Diagnosis not present

## 2020-03-03 DIAGNOSIS — F528 Other sexual dysfunction not due to a substance or known physiological condition: Secondary | ICD-10-CM | POA: Diagnosis not present

## 2020-03-03 DIAGNOSIS — Z9119 Patient's noncompliance with other medical treatment and regimen: Secondary | ICD-10-CM | POA: Diagnosis not present

## 2020-03-03 DIAGNOSIS — N179 Acute kidney failure, unspecified: Secondary | ICD-10-CM | POA: Diagnosis not present

## 2020-03-03 DIAGNOSIS — Z7982 Long term (current) use of aspirin: Secondary | ICD-10-CM | POA: Insufficient documentation

## 2020-03-03 DIAGNOSIS — F259 Schizoaffective disorder, unspecified: Secondary | ICD-10-CM | POA: Diagnosis not present

## 2020-03-03 DIAGNOSIS — E785 Hyperlipidemia, unspecified: Secondary | ICD-10-CM | POA: Diagnosis not present

## 2020-03-03 DIAGNOSIS — I10 Essential (primary) hypertension: Secondary | ICD-10-CM | POA: Diagnosis not present

## 2020-03-03 DIAGNOSIS — Z9114 Patient's other noncompliance with medication regimen: Secondary | ICD-10-CM

## 2020-03-03 LAB — COMPREHENSIVE METABOLIC PANEL
ALT: 22 U/L (ref 0–44)
AST: 17 U/L (ref 15–41)
Albumin: 3.9 g/dL (ref 3.5–5.0)
Alkaline Phosphatase: 139 U/L — ABNORMAL HIGH (ref 38–126)
Anion gap: 11 (ref 5–15)
BUN: 26 mg/dL — ABNORMAL HIGH (ref 6–20)
CO2: 22 mmol/L (ref 22–32)
Calcium: 9.5 mg/dL (ref 8.9–10.3)
Chloride: 102 mmol/L (ref 98–111)
Creatinine, Ser: 1.73 mg/dL — ABNORMAL HIGH (ref 0.44–1.00)
GFR, Estimated: 34 mL/min — ABNORMAL LOW (ref >60)
Glucose, Bld: 197 mg/dL — ABNORMAL HIGH (ref 70–99)
Potassium: 4.5 mmol/L (ref 3.5–5.1)
Sodium: 135 mmol/L (ref 135–145)
Total Bilirubin: 0.4 mg/dL (ref 0.3–1.2)
Total Protein: 7.8 g/dL (ref 6.5–8.1)

## 2020-03-03 LAB — ETHANOL: Alcohol, Ethyl (B): <10 mg/dL (ref <10)

## 2020-03-03 LAB — CBC WITH DIFFERENTIAL/PLATELET
Abs Immature Granulocytes: 0.03 10*3/uL (ref 0.00–0.07)
Basophils Absolute: 0.1 10*3/uL (ref 0.0–0.1)
Basophils Relative: 1 %
Eosinophils Absolute: 0.2 10*3/uL (ref 0.0–0.5)
Eosinophils Relative: 3 %
HCT: 32.6 % — ABNORMAL LOW (ref 36.0–46.0)
Hemoglobin: 10.3 g/dL — ABNORMAL LOW (ref 12.0–15.0)
Immature Granulocytes: 0 %
Lymphocytes Relative: 27 %
Lymphs Abs: 2.6 10*3/uL (ref 0.7–4.0)
MCH: 29.5 pg (ref 26.0–34.0)
MCHC: 31.6 g/dL (ref 30.0–36.0)
MCV: 93.4 fL (ref 80.0–100.0)
Monocytes Absolute: 0.5 10*3/uL (ref 0.1–1.0)
Monocytes Relative: 5 %
Neutro Abs: 5.9 10*3/uL (ref 1.7–7.7)
Neutrophils Relative %: 64 %
Platelets: 316 10*3/uL (ref 150–400)
RBC: 3.49 MIL/uL — ABNORMAL LOW (ref 3.87–5.11)
RDW: 15.8 % — ABNORMAL HIGH (ref 11.5–15.5)
WBC: 9.3 10*3/uL (ref 4.0–10.5)
nRBC: 0 % (ref 0.0–0.2)

## 2020-03-03 LAB — RAPID URINE DRUG SCREEN, HOSP PERFORMED
Amphetamines: NOT DETECTED
Barbiturates: NOT DETECTED
Benzodiazepines: NOT DETECTED
Cocaine: NOT DETECTED
Opiates: NOT DETECTED
Tetrahydrocannabinol: NOT DETECTED

## 2020-03-03 MED ORDER — PANTOPRAZOLE SODIUM 40 MG PO TBEC
40.0000 mg | DELAYED_RELEASE_TABLET | Freq: Two times a day (BID) | ORAL | Status: DC
  Administered 2020-03-04 (×2): 40 mg via ORAL
  Filled 2020-03-03 (×2): qty 1

## 2020-03-03 MED ORDER — FUROSEMIDE 40 MG PO TABS
40.0000 mg | ORAL_TABLET | Freq: Every day | ORAL | Status: DC
  Administered 2020-03-04: 40 mg via ORAL
  Filled 2020-03-03: qty 1

## 2020-03-03 MED ORDER — INSULIN GLARGINE 100 UNIT/ML ~~LOC~~ SOLN
24.0000 [IU] | Freq: Every day | SUBCUTANEOUS | Status: DC
  Filled 2020-03-03 (×2): qty 0.24

## 2020-03-03 MED ORDER — TRAZODONE HCL 50 MG PO TABS: 50.0000 mg | ORAL_TABLET | Freq: Every evening | ORAL | Status: DC | PRN

## 2020-03-03 MED ORDER — ARIPIPRAZOLE 5 MG PO TABS
15.0000 mg | ORAL_TABLET | Freq: Every day | ORAL | Status: DC
  Administered 2020-03-03: 15 mg via ORAL
  Filled 2020-03-03: qty 3

## 2020-03-03 MED ORDER — VITAMIN B-12 1000 MCG PO TABS
1000.0000 ug | ORAL_TABLET | Freq: Every day | ORAL | Status: DC
  Administered 2020-03-04: 1000 ug via ORAL
  Filled 2020-03-03: qty 1

## 2020-03-03 MED ORDER — ATORVASTATIN CALCIUM 40 MG PO TABS
40.0000 mg | ORAL_TABLET | Freq: Every day | ORAL | Status: DC
  Administered 2020-03-04: 40 mg via ORAL
  Filled 2020-03-03: qty 1

## 2020-03-03 MED ORDER — GABAPENTIN 300 MG PO CAPS
300.0000 mg | ORAL_CAPSULE | Freq: Every day | ORAL | Status: DC
  Administered 2020-03-03: 300 mg via ORAL
  Filled 2020-03-03: qty 1

## 2020-03-03 MED ORDER — HYDROCHLOROTHIAZIDE 25 MG PO TABS
12.5000 mg | ORAL_TABLET | Freq: Every day | ORAL | Status: DC
  Administered 2020-03-04: 12.5 mg via ORAL
  Filled 2020-03-03: qty 1

## 2020-03-03 MED ORDER — METFORMIN HCL 500 MG PO TABS
1000.0000 mg | ORAL_TABLET | Freq: Every day | ORAL | Status: DC
  Administered 2020-03-04: 1000 mg via ORAL
  Filled 2020-03-03: qty 2

## 2020-03-03 MED ORDER — METOPROLOL SUCCINATE ER 25 MG PO TB24
50.0000 mg | ORAL_TABLET | Freq: Every day | ORAL | Status: DC
  Administered 2020-03-04: 50 mg via ORAL
  Filled 2020-03-03: qty 2

## 2020-03-03 MED ORDER — COLESTIPOL HCL 1 G PO TABS
1.0000 g | ORAL_TABLET | Freq: Every day | ORAL | Status: DC
  Administered 2020-03-04: 1 g via ORAL
  Filled 2020-03-03 (×3): qty 1

## 2020-03-03 MED ORDER — FERROUS SULFATE 325 (65 FE) MG PO TABS
325.0000 mg | ORAL_TABLET | Freq: Every day | ORAL | Status: DC
  Administered 2020-03-04: 325 mg via ORAL
  Filled 2020-03-03: qty 1

## 2020-03-03 MED ORDER — BUPROPION HCL ER (XL) 150 MG PO TB24
150.0000 mg | ORAL_TABLET | Freq: Every day | ORAL | Status: DC
  Administered 2020-03-04: 150 mg via ORAL
  Filled 2020-03-03: qty 1

## 2020-03-03 NOTE — ED Triage Notes (Signed)
Per pt she was brought in by [employer] for IVC. Pt states her family is crazy and she is doing nothing wrong but talking to men online to have relations. Pt denies any SI/HI ideations at this time.

## 2020-03-03 NOTE — ED Provider Notes (Signed)
[name] EMERGENCY DEPARTMENT Provider Note   CSN: [id number] Arrival date & time: 03/03/20  2218     History Chief Complaint  Patient presents with  . V70.1    [name] is a 54 y.o. female.  Pt presents to the ED today with IVC papers.  Pt was admitted from 10/21-27 for manic behavior and medication noncompliance.  She has been opening bank accounts with men she met on the internet.  She's been having multiple sexual relations with men she's met online.  Pt's meds were restarted during her psych admission and she was d/c home.  Pt has refused to see a therapist and has continued to give money away to random men and have multiple sexual partners.  Pt tells me there is nothing wrong with what she is doing. She denies si/hi.  However, IVC papers say pt has threatened this to her family.        Past Medical History:  Diagnosis Date  . Charcot's joint of foot, left   . Depression   . Diabetes mellitus without complication ([place])   . Hyperlipidemia   . Hypertension   . Neuromuscular disorder ([place])   . Schizo affective schizophrenia ([place])    abilify and pazil    Patient Active Problem List   Diagnosis Date Noted  . Schizophrenia ([place]) 02/20/2020  . Chronic diarrhea 11/25/2019  . History of colon cancer 11/25/2019  . Heart palpitations 08/19/2019  . Iron deficiency anemia 07/25/2019  . Diabetes mellitus with polyneuropathy ([place]) 07/17/2019  . S/P partial colectomy 07/17/2019  . Acute kidney injury ([place]) 07/17/2019  . 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[place]) 05/28/2019  . Anemia   . Hyponatremia 05/23/2019  . Neuropathic foot ulcer, left, with fat layer exposed ([place]) 02/05/2019  . Hyperkalemia 02/05/2019  . Persistent microalbuminuria associated with type 2 diabetes mellitus ([place]) 05/16/2018  . Type 2 diabetes mellitus with hyperglycemia, with long-term current use of insulin ([place]) 05/16/2018  . Morbid obesity ([place]) 05/10/2017  . Long-term use of high-risk  medication 05/10/2017  . Hyperlipidemia associated with type 2 diabetes mellitus ([place]) 06/27/2016  . Hammer toe of second toe of right foot 04/20/2016  . Essential hypertension 06/29/2015  . Schizoaffective disorder ([place]) 06/29/2015  . DM (diabetes mellitus), type 2, 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[place]) 34/74/2595  . Polyneuropathy 04/02/2014    Past Surgical History:  Procedure Laterality Date  . CHOLECYSTECTOMY    . COLONOSCOPY WITH PROPOFOL N/A 05/26/2019   Procedure: COLONOSCOPY WITH PROPOFOL;  Surgeon: Toledo, Benay Pike, MD;  Location: [facility] ENDOSCOPY;  Service: Gastroenterology;  Laterality: N/A;  . ESOPHAGOGASTRODUODENOSCOPY (EGD) WITH PROPOFOL N/A 05/26/2019   Procedure: ESOPHAGOGASTRODUODENOSCOPY (EGD) WITH PROPOFOL;  Surgeon: Toledo, Benay Pike, MD;  Location: [facility] ENDOSCOPY;  Service: Gastroenterology;  Laterality: N/A;  . IRRIGATION AND DEBRIDEMENT FOOT Right 02/26/2016   Procedure: RIGHT 2ND TOE DEBRIDEMENT;  Surgeon: Samara Deist, DPM;  Location: [facility] ORS;  Service: Podiatry;  Laterality: Right;  . IRRIGATION AND DEBRIDEMENT FOOT Left 12/13/2018   Procedure: IRRIGATION AND DEBRIDEMENT FOOT;  Surgeon: Caroline More, DPM;  Location: [facility] ORS;  Service: Podiatry;  Laterality: Left;     OB History    Gravida  2   Para  2   Term  2   Preterm      AB      Living  2     SAB      TAB      Ectopic      Multiple  Live Births              Family History  Problem Relation Age of Onset  . Breast cancer Cousin        maternal cousin  . Hypertension Mother   . Hyperthyroidism Mother   . Dementia Mother   . Prostate cancer Father   . Diabetes Maternal Grandmother   . Hypertension Maternal Grandmother   . Cancer Maternal Grandmother        unknown type  . Diabetes Maternal Grandfather   . Hypertension Maternal Grandfather   . Diabetes Paternal Grandmother   . Hypertension Paternal Grandmother   . Diabetes Paternal Grandfather   . Hypertension  Paternal Grandfather     Social History   Tobacco Use  . Smoking status: Former Smoker    Packs/day: 3.00    Years: 30.00    Pack years: 90.00    Types: Cigarettes    Quit date: 05/02/2009    Years since quitting: 10.8  . Smokeless tobacco: Never Used  . Tobacco comment: Pt reported  quitting in 2011  Vaping Use  . Vaping Use: Never used  Substance Use Topics  . Alcohol use: No  . Drug use: No    Home Medications Prior to Admission medications   Medication Sig Start Date End Date Taking? Authorizing Provider  acetaminophen (TYLENOL) 325 MG tablet Take 2 tablets (650 mg total) by mouth every 6 (six) hours as needed for mild pain. 06/04/19   Nicole [facility] A, DO  ARIPiprazole (ABILIFY) 15 MG tablet Take 1 tablet (15 mg total) by mouth at bedtime. 02/26/20   Salley Scarlet, MD  aspirin EC 81 MG tablet Take 1 tablet (81 mg total) by mouth daily. Patient not taking: Reported on 02/19/2020 12/18/19   [name], DO  atorvastatin (LIPITOR) 40 MG tablet Take 1 tablet (40 mg total) by mouth daily. 02/26/20   Salley Scarlet, MD  Blood Glucose Monitoring Suppl [facility] VERIO) w/Device KIT Use to check blood sugar up to 3 x daily 09/11/19   Parks Ranger, Devonne Doughty, DO  buPROPion (WELLBUTRIN XL) 150 MG 24 hr tablet Take 1 tablet (150 mg total) by mouth daily. 02/27/20   Salley Scarlet, MD  colestipol (COLESTID) 1 g tablet Take 1 tablet (1 g total) by mouth daily. 02/27/20   Salley Scarlet, MD  Ferrous Sulfate 27 MG TABS Take 27 mg by mouth daily.     [provider]  furosemide (LASIX) 40 MG tablet Take 1 tablet (40 mg total) by mouth daily. 02/26/20   Salley Scarlet, MD  gabapentin (NEURONTIN) 300 MG capsule Take 1 capsule (300 mg total) by mouth at bedtime. 02/26/20   Salley Scarlet, MD  hydrochlorothiazide (HYDRODIURIL) 12.5 MG tablet Take 1 tablet (12.5 mg total) by mouth daily. 02/26/20   Salley Scarlet, MD  insulin glargine (LANTUS) 100 UNIT/ML  injection Inject 0.24 mLs (24 Units total) into the skin daily. 02/27/20   Salley Scarlet, MD  Lancets [facility] ULTRASOFT) lancets Use to check blood sugar up to 3 x daily 03/18/19   [name], DO  metFORMIN (GLUCOPHAGE) 1000 MG tablet Take 1 tablet (1,000 mg total) by mouth daily with breakfast. 02/27/20   Salley Scarlet, MD  metoprolol succinate (TOPROL-XL) 50 MG 24 hr tablet Take 1 tablet (50 mg total) by mouth daily. Take with or immediately following a meal. 02/26/20   Salley Scarlet, MD  ondansetron (ZOFRAN) 4 MG tablet  Take 1 tablet (4 mg total) by mouth every 6 (six) hours. Patient taking differently: Take 4 mg by mouth every 8 (eight) hours as needed for nausea or vomiting.  05/16/19   W[place]st, [place], PA-C  ONETOUCH VERIO test strip Check blood sugar up to 5 x daily as needed 04/17/19   [name], DO  pantoprazole (PROTONIX) 40 MG tablet Take 1 tablet (40 mg total) by mouth 2 (two) times daily before a meal. 02/26/20   Salley Scarlet, MD  traZODone (DESYREL) 50 MG tablet Take 1 tablet (50 mg total) by mouth at bedtime as needed. for sleep 02/26/20   Salley Scarlet, MD  vitamin B-12 1000 MCG tablet Take 1 tablet (1,000 mcg total) by mouth daily. 06/05/19   Ezekiel Slocumb, DO    Allergies    Patient has no known allergies.  Review of Systems   Review of Systems  Psychiatric/Behavioral: Positive for behavioral problems.  All other systems reviewed and are negative.   Physical Exam Updated Vital Signs BP (!) 187/103 (BP Location: Right Arm)   Pulse (!) 122   Temp 97.8 F (36.6 C) (Oral)   Resp (!) 22   Ht '5\' 10"'  (1.778 m)   Wt 106.6 kg   SpO2 100%   BMI 33.72 kg/m   Physical Exam Vitals and nursing note reviewed.  Constitutional:      Appearance: Normal appearance.  HENT:     Head: Normocephalic and atraumatic.     Right Ear: External ear normal.     Left Ear: External ear normal.     Nose: Nose normal.     Mouth/Throat:      Mouth: Mucous membranes are moist.     Pharynx: Oropharynx is clear.  Eyes:     Extraocular Movements: Extraocular movements intact.     Conjunctiva/sclera: Conjunctivae normal.     Pupils: Pupils are equal, round, and reactive to light.  Cardiovascular:     Rate and Rhythm: Normal rate and regular rhythm.     Pulses: Normal pulses.     Heart sounds: Normal heart sounds.  Pulmonary:     Effort: Pulmonary effort is normal.     Breath sounds: Normal breath sounds.  Abdominal:     General: Abdomen is flat. Bowel sounds are normal.     Palpations: Abdomen is soft.  Musculoskeletal:        General: Normal range of motion.     Cervical back: Normal range of motion and neck supple.  Skin:    General: Skin is warm.     Capillary Refill: Capillary refill takes less than 2 seconds.  Neurological:     General: No focal deficit present.     Mental Status: She is alert and oriented to person, place, and time.  Psychiatric:        Attention and Perception: Attention and perception normal.        Mood and Affect: Mood and affect normal.        Speech: Speech normal.        Behavior: Behavior is cooperative.        Thought Content: Thought content normal.        Judgment: Judgment is inappropriate.     ED Results / Procedures / Treatments   Labs (all labs ordered are listed, but only abnormal results are displayed) Labs Reviewed  RESPIRATORY PANEL BY RT PCR (FLU A&B, COVID)  COMPREHENSIVE METABOLIC PANEL  ETHANOL  RAPID URINE DRUG SCREEN,  HOSP PERFORMED  CBC WITH DIFFERENTIAL/PLATELET  URINALYSIS, ROUTINE W REFLEX MICROSCOPIC    EKG None  Radiology No results found.  Procedures Procedures (including critical care time)  Medications Ordered in ED Medications  ARIPiprazole (ABILIFY) tablet 15 mg (has no administration in time range)  atorvastatin (LIPITOR) tablet 40 mg (has no administration in time range)  buPROPion (WELLBUTRIN XL) 24 hr tablet 150 mg (has no administration  in time range)  colestipol (COLESTID) tablet 1 g (has no administration in time range)  ferrous sulfate tablet 325 mg (has no administration in time range)  furosemide (LASIX) tablet 40 mg (has no administration in time range)  gabapentin (NEURONTIN) capsule 300 mg (has no administration in time range)  hydrochlorothiazide (HYDRODIURIL) tablet 12.5 mg (has no administration in time range)  insulin glargine (LANTUS) injection 24 Units (has no administration in time range)  metFORMIN (GLUCOPHAGE) tablet 1,000 mg (has no administration in time range)  metoprolol succinate (TOPROL-XL) 24 hr tablet 50 mg (has no administration in time range)  pantoprazole (PROTONIX) EC tablet 40 mg (has no administration in time range)  traZODone (DESYREL) tablet 50 mg (has no administration in time range)  vitamin B-12 (CYANOCOBALAMIN) tablet 1,000 mcg (has no administration in time range)    ED Course  I have reviewed the triage vital signs and the nursing notes.  Pertinent labs & imaging results that were available during my care of the patient were reviewed by me and considered in my medical decision making (see chart for details).    MDM Rules/Calculators/A&P                          Pt has not been compliant with her meds.  Home meds ordered.  Pt's labs are pending.  She will need a TTS consult when she is medically clear.  Final Clinical Impression(s) / ED Diagnoses Final diagnoses:  Noncompliance with medications  Hypersexuality state    Rx / DC Orders ED Discharge Orders    None       Isla Pence, MD 03/03/20 2304

## 2020-03-03 NOTE — ED Notes (Signed)
Pt dressed out in scrubs, belongings in locker, per charge RN pt can keep her walker.

## 2020-03-03 NOTE — ED Notes (Signed)
Pt wanded by security in triage. 

## 2020-03-04 DIAGNOSIS — F259 Schizoaffective disorder, unspecified: Secondary | ICD-10-CM | POA: Diagnosis not present

## 2020-03-04 DIAGNOSIS — R531 Weakness: Secondary | ICD-10-CM | POA: Insufficient documentation

## 2020-03-04 LAB — URINALYSIS, ROUTINE W REFLEX MICROSCOPIC
Bacteria, UA: NONE SEEN
Bilirubin Urine: NEGATIVE
Glucose, UA: 50 mg/dL — AB
Ketones, ur: NEGATIVE mg/dL
Nitrite: NEGATIVE
Protein, ur: 30 mg/dL — AB
Specific Gravity, Urine: 1.016 (ref 1.005–1.030)
WBC, UA: >50 WBC/hpf — ABNORMAL HIGH (ref 0–5)
pH: 5 (ref 5.0–8.0)

## 2020-03-04 LAB — RESPIRATORY PANEL BY RT PCR (FLU A&B, COVID)
Influenza A by PCR: NEGATIVE
Influenza B by PCR: NEGATIVE
SARS Coronavirus 2 by RT PCR: NEGATIVE

## 2020-03-04 NOTE — Telephone Encounter (Signed)
Can you contact [facility] and find out what information they need? This PT referral was originally placed by the hospital I believe.   Here are some diagnosis codes if need.  Generalized Weakness R53.1, Diabetes type 2 uncontrolled with neurologic complication Y42.99, Q06.99  [name], DO [facility] 03/04/2020, 8:16 AM

## 2020-03-04 NOTE — Telephone Encounter (Signed)
Codes and phone number provided to PT--no further information needed

## 2020-03-04 NOTE — Discharge Instructions (Addendum)
Follow-up as instructed by behavioral health 
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
## 2020-03-11 ENCOUNTER — Telehealth: Payer: Medicare Other

## 2020-03-11 ENCOUNTER — Telehealth: Payer: Self-pay | Admitting: Pharmacist

## 2020-03-11 NOTE — Chronic Care Management (AMB) (Signed)
°  Chronic Care Management   Outreach Note  03/11/2020 Name: [name] MRN: [id number] DOB: [birth year]  Referred by: [name], DO Reason for referral : No chief complaint on file.  Was unable to reach patient via telephone today. When call mobile number, message indicates line has calling restrictions and unable to leave a message. Phone number previously listed as home contact is a wrong number.  Collaborate with CM Nurse Case Manager  Follow Up Plan: CCM team will attempt to reach patient again within next 14 days.  Harlow Asa, PharmD, [place] [facility] [phone]

## 2020-03-12 ENCOUNTER — Telehealth: Payer: Self-pay | Admitting: General Practice

## 2020-03-12 ENCOUNTER — Telehealth: Payer: Self-pay

## 2020-03-12 NOTE — Telephone Encounter (Signed)
  Chronic Care Management   Outreach Note  03/12/2020 Name: [name] MRN: [id number] DOB: Jan 06, 1966  Referred by: [name], DO Reason for referral : Chronic Care Management (RNCM Follow up call for Chronic Disease Management and Care Coordination Needs)   An unsuccessful telephone outreach was attempted today. The patient was referred to the case management team for assistance with care management and care coordination. The mobile number has calling restrictions and the RNCM was unsuccessful in reaching the patient. Call to the emergency contact, [name] to see if an alternate number was available. The daughter [name] gave the [facility] a home phone number for the patient @ [phone].  The daughter states she is no longer assisting in the patients care. The RNCM placed a call to the new number. It rang for over one minute before the call was disconnected.   Follow Up Plan: The care management team will reach out to the patient again over the next 30 to 60 days.   Noreene Larsson RN, MSN, [place] Mobile: [phone]

## 2020-03-17 ENCOUNTER — Telehealth: Payer: Self-pay | Admitting: Family Medicine

## 2020-03-17 ENCOUNTER — Ambulatory Visit: Payer: Medicare Other | Admitting: Family Medicine

## 2020-03-17 NOTE — Telephone Encounter (Signed)
Patient's sister called to cancel appt. For today but would like to reschedule for Thurs. Or Friday.  Check the schedule and nothing was available until 11/23.  She would like to see if patient could be worked in sooner.  Tried to reach the office 2x but no answer.  Please call relative back because she stated that it was very important to get her seen asap.  CB# [phone]

## 2020-03-17 NOTE — Telephone Encounter (Signed)
R/S Schedule Pt

## 2020-03-18 ENCOUNTER — Ambulatory Visit: Payer: Medicare Other

## 2020-03-18 DIAGNOSIS — R531 Weakness: Secondary | ICD-10-CM

## 2020-03-18 DIAGNOSIS — F251 Schizoaffective disorder, depressive type: Secondary | ICD-10-CM

## 2020-03-18 DIAGNOSIS — IMO0002 Reserved for concepts with insufficient information to code with codable children: Secondary | ICD-10-CM

## 2020-03-18 DIAGNOSIS — E785 Hyperlipidemia, unspecified: Secondary | ICD-10-CM

## 2020-03-18 DIAGNOSIS — I10 Essential (primary) hypertension: Secondary | ICD-10-CM

## 2020-03-18 DIAGNOSIS — E1165 Type 2 diabetes mellitus with hyperglycemia: Secondary | ICD-10-CM

## 2020-03-18 DIAGNOSIS — E1169 Type 2 diabetes mellitus with other specified complication: Secondary | ICD-10-CM
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
## 2020-03-24 ENCOUNTER — Ambulatory Visit: Payer: Medicare Other | Admitting: Family Medicine

## 2020-04-01 ENCOUNTER — Ambulatory Visit: Payer: Medicare Other | Admitting: Licensed Clinical Social Worker

## 2020-04-01 DIAGNOSIS — E785 Hyperlipidemia, unspecified: Secondary | ICD-10-CM

## 2020-04-01 DIAGNOSIS — I10 Essential (primary) hypertension: Secondary | ICD-10-CM

## 2020-04-01 DIAGNOSIS — IMO0002 Reserved for concepts with insufficient information to code with codable children: Secondary | ICD-10-CM

## 2020-04-01 DIAGNOSIS — E1169 Type 2 diabetes mellitus with other specified complication: Secondary | ICD-10-CM

## 2020-04-01 DIAGNOSIS — E1165 Type 2 diabetes mellitus with hyperglycemia: Secondary | ICD-10-CM

## 2020-04-01 DIAGNOSIS — F251 Schizoaffective disorder, depressive type: Secondary | ICD-10-CM
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
## 2020-04-14 ENCOUNTER — Ambulatory Visit: Payer: Medicare Other | Attending: Oncology

## 2020-04-14 ENCOUNTER — Telehealth: Payer: Self-pay | Admitting: *Deleted

## 2020-04-14 NOTE — Telephone Encounter (Signed)
Pt was no show for CT scan this morning. Follow up visit in the lung nodule clinic has been cancelled since pt did not get CT scan this morning. Attempted to contact pt to inform of cancellation and ask if wanted to reschedule appts. Pt did not answer at either number listed in chart. Letter mailed to address on file for pt to call back to reschedule appts.

## 2020-04-15 ENCOUNTER — Inpatient Hospital Stay: Payer: Medicaid Other | Admitting: Oncology

## 2020-04-17 ENCOUNTER — Ambulatory Visit (INDEPENDENT_AMBULATORY_CARE_PROVIDER_SITE_OTHER): Payer: Medicare Other | Admitting: Family Medicine

## 2020-04-17 ENCOUNTER — Encounter: Payer: Self-pay | Admitting: Family Medicine

## 2020-04-17 ENCOUNTER — Other Ambulatory Visit: Payer: Self-pay

## 2020-04-17 VITALS — BP 134/76 | HR 92 | Temp 97.1°F | Resp 16 | Ht 70.5 in | Wt 238.0 lb

## 2020-04-17 DIAGNOSIS — F251 Schizoaffective disorder, depressive type: Secondary | ICD-10-CM

## 2020-04-17 DIAGNOSIS — E1165 Type 2 diabetes mellitus with hyperglycemia: Secondary | ICD-10-CM

## 2020-04-17 DIAGNOSIS — IMO0002 Reserved for concepts with insufficient information to code with codable children: Secondary | ICD-10-CM

## 2020-04-17 DIAGNOSIS — E1149 Type 2 diabetes mellitus with other diabetic neurological complication: Secondary | ICD-10-CM | POA: Diagnosis not present

## 2020-04-17 DIAGNOSIS — L97522 Non-pressure chronic ulcer of other part of left foot with fat layer exposed: Secondary | ICD-10-CM

## 2020-04-17 DIAGNOSIS — C187 Malignant neoplasm of sigmoid colon: Secondary | ICD-10-CM
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
## 2020-04-17 NOTE — Patient Instructions (Addendum)
Thank you for coming to the office today.  We will discuss more with social work and case management to determine how best we can help.  It will be very important to work with Psychiatry going forward to determine if they can help treat your mental health to allow you to improve overall.  Please schedule a Follow-up Appointment to: Return if symptoms worsen or fail to improve.  If you have any other questions or concerns, please feel free to call the office or send a message through [facility]. You may also schedule an earlier appointment if necessary.  Additionally, you may be receiving a survey about your experience at our office within a few days to 1 week by e-mail or mail. We value your feedback.  [name], DO [place]

## 2020-04-22 ENCOUNTER — Other Ambulatory Visit: Payer: Self-pay | Admitting: Family Medicine

## 2020-04-22 DIAGNOSIS — IMO0002 Reserved for concepts with insufficient information to code with codable children: Secondary | ICD-10-CM

## 2020-04-22 MED ORDER — INSULIN GLARGINE 100 UNIT/ML ~~LOC~~ SOLN: 20.0000 [IU] | Freq: Every day | SUBCUTANEOUS | 1 refills | Status: DC

## 2020-04-22 NOTE — Telephone Encounter (Signed)
Medication Refill - Medication: Pt needs whichever insulin is currently prescribed by PCP she is out   insulin glargine (LANTUS) 100 UNIT/ML injection   Pt is completely out, needs refill today   Has the patient contacted their pharmacy? Yes.   (Agent: If no, request that the patient contact the pharmacy for the refill.) (Agent: If yes, when and what did the pharmacy advise?)  Preferred Pharmacy (with phone number or street name):  [facility] [address]  [place] [address]  Phone: [phone] Fax: [phone]     Agent: Please be advised that RX refills may take up to 3 business days. We ask that you follow-up with your pharmacy.

## 2020-04-22 NOTE — Telephone Encounter (Signed)
   Notes to clinic: Patient is out of medication Medication prescribed by different provider Review for refill   Requested Prescriptions  Pending Prescriptions Disp Refills   insulin glargine (LANTUS) 100 UNIT/ML injection 10 mL 1    Sig: Inject 0.24 mLs (24 Units total) into the skin daily.      Endocrinology:  Diabetes - Insulins Failed - 04/22/2020  1:51 PM      Failed - HBA1C is between 0 and 7.9 and within 180 days    Hemoglobin A1C  Date Value Ref Range Status  03/28/2018 11.9  Final    Comment:    [facility]   Hgb A1c MFr Bld  Date Value Ref Range Status  12/05/2019 8.8 (H) 4.8 - 5.6 % Final    Comment:    (NOTE) Pre diabetes:          5.7%-6.4%  Diabetes:              >6.4%  Glycemic control for   <7.0% adults with diabetes           Passed - Valid encounter within last 6 months    Recent Outpatient Visits           5 days ago Schizoaffective disorder, depressive type [facility])   [place], DO   9 months ago Adenocarcinoma of sigmoid colon [facility])   [facility] [name], DO   9 months ago Essential hypertension   [facility] [name], DO   1 year ago Essential hypertension   [place], DO   1 year ago Charcot's joint of left foot   [place], Devonne Doughty, [place]

## 2020-05-01 ENCOUNTER — Other Ambulatory Visit: Payer: Self-pay | Admitting: Family Medicine

## 2020-05-01 DIAGNOSIS — IMO0002 Reserved for concepts with insufficient information to code with codable children: Secondary | ICD-10-CM

## 2020-05-01 DIAGNOSIS — E1149 Type 2 diabetes mellitus with other diabetic neurological complication: Secondary | ICD-10-CM

## 2020-05-08 ENCOUNTER — Other Ambulatory Visit: Payer: Self-pay

## 2020-05-08 ENCOUNTER — Inpatient Hospital Stay (HOSPITAL_COMMUNITY)
Admission: EM | Admit: 2020-05-08 | Discharge: 2020-05-11 | DRG: 684 | Disposition: A | Discharge status: Home Health | Payer: Medicare Other | Attending: Family Medicine | Admitting: Family Medicine

## 2020-05-08 ENCOUNTER — Encounter (HOSPITAL_COMMUNITY): Payer: Self-pay

## 2020-05-08 DIAGNOSIS — E1129 Type 2 diabetes mellitus with other diabetic kidney complication: Secondary | ICD-10-CM | POA: Diagnosis not present

## 2020-05-08 DIAGNOSIS — Z803 Family history of malignant neoplasm of breast: Secondary | ICD-10-CM

## 2020-05-08 DIAGNOSIS — I10 Essential (primary) hypertension: Secondary | ICD-10-CM | POA: Diagnosis present

## 2020-05-08 DIAGNOSIS — Z9049 Acquired absence of other specified parts of digestive tract: Secondary | ICD-10-CM | POA: Diagnosis not present

## 2020-05-08 DIAGNOSIS — E785 Hyperlipidemia, unspecified: Secondary | ICD-10-CM | POA: Diagnosis present

## 2020-05-08 DIAGNOSIS — Z87891 Personal history of nicotine dependence: Secondary | ICD-10-CM

## 2020-05-08 DIAGNOSIS — E1165 Type 2 diabetes mellitus with hyperglycemia: Secondary | ICD-10-CM

## 2020-05-08 DIAGNOSIS — F201 Disorganized schizophrenia: Secondary | ICD-10-CM | POA: Diagnosis not present

## 2020-05-08 DIAGNOSIS — Z79899 Other long term (current) drug therapy: Secondary | ICD-10-CM

## 2020-05-08 DIAGNOSIS — F259 Schizoaffective disorder, unspecified: Secondary | ICD-10-CM | POA: Diagnosis present

## 2020-05-08 DIAGNOSIS — Z8249 Family history of ischemic heart disease and other diseases of the circulatory system: Secondary | ICD-10-CM

## 2020-05-08 DIAGNOSIS — Z794 Long term (current) use of insulin: Secondary | ICD-10-CM | POA: Diagnosis not present

## 2020-05-08 DIAGNOSIS — E11610 Type 2 diabetes mellitus with diabetic neuropathic arthropathy: Secondary | ICD-10-CM | POA: Diagnosis present

## 2020-05-08 DIAGNOSIS — R809 Proteinuria, unspecified: Secondary | ICD-10-CM

## 2020-05-08 DIAGNOSIS — Z833 Family history of diabetes mellitus: Secondary | ICD-10-CM | POA: Diagnosis not present

## 2020-05-08 DIAGNOSIS — N179 Acute kidney failure, unspecified: Secondary | ICD-10-CM | POA: Diagnosis not present

## 2020-05-08 DIAGNOSIS — Z23 Encounter for immunization: Secondary | ICD-10-CM | POA: Diagnosis present

## 2020-05-08 DIAGNOSIS — E1149 Type 2 diabetes mellitus with other diabetic neurological complication: Secondary | ICD-10-CM

## 2020-05-08 DIAGNOSIS — F32A Depression, unspecified: Secondary | ICD-10-CM | POA: Diagnosis present

## 2020-05-08 DIAGNOSIS — Z8042 Family history of malignant neoplasm of prostate: Secondary | ICD-10-CM | POA: Diagnosis not present

## 2020-05-08 DIAGNOSIS — Z7984 Long term (current) use of oral hypoglycemic drugs: Secondary | ICD-10-CM

## 2020-05-08 DIAGNOSIS — Z20822 Contact with and (suspected) exposure to covid-19: Secondary | ICD-10-CM | POA: Diagnosis present

## 2020-05-08 DIAGNOSIS — Z85038 Personal history of other malignant neoplasm of large intestine: Secondary | ICD-10-CM

## 2020-05-08 DIAGNOSIS — R4781 Slurred speech: Secondary | ICD-10-CM | POA: Diagnosis not present

## 2020-05-08 DIAGNOSIS — R739 Hyperglycemia, unspecified: Secondary | ICD-10-CM

## 2020-05-08 DIAGNOSIS — IMO0002 Reserved for concepts with insufficient information to code with codable children: Secondary | ICD-10-CM | POA: Diagnosis present

## 2020-05-08 DIAGNOSIS — E860 Dehydration: Secondary | ICD-10-CM | POA: Diagnosis present

## 2020-05-08 DIAGNOSIS — F209 Schizophrenia, unspecified: Secondary | ICD-10-CM | POA: Diagnosis present

## 2020-05-08 LAB — CBG MONITORING, ED
Glucose-Capillary: 331 mg/dL — ABNORMAL HIGH (ref 70–99)
Glucose-Capillary: 272 mg/dL — ABNORMAL HIGH (ref 70–99)
Glucose-Capillary: 243 mg/dL — ABNORMAL HIGH (ref 70–99)

## 2020-05-08 LAB — BASIC METABOLIC PANEL
Anion gap: 10 (ref 5–15)
BUN: 53 mg/dL — ABNORMAL HIGH (ref 6–20)
CO2: 20 mmol/L — ABNORMAL LOW (ref 22–32)
Calcium: 8.9 mg/dL (ref 8.9–10.3)
Chloride: 104 mmol/L (ref 98–111)
Creatinine, Ser: 3.05 mg/dL — ABNORMAL HIGH (ref 0.44–1.00)
GFR, Estimated: 17 mL/min — ABNORMAL LOW (ref >60)
Glucose, Bld: 284 mg/dL — ABNORMAL HIGH (ref 70–99)
Potassium: 4.4 mmol/L (ref 3.5–5.1)
Sodium: 134 mmol/L — ABNORMAL LOW (ref 135–145)

## 2020-05-08 LAB — CBC
HCT: 32.3 % — ABNORMAL LOW (ref 36.0–46.0)
Hemoglobin: 10.3 g/dL — ABNORMAL LOW (ref 12.0–15.0)
MCH: 29.7 pg (ref 26.0–34.0)
MCHC: 31.9 g/dL (ref 30.0–36.0)
MCV: 93.1 fL (ref 80.0–100.0)
Platelets: 228 10*3/uL (ref 150–400)
RBC: 3.47 MIL/uL — ABNORMAL LOW (ref 3.87–5.11)
RDW: 15.1 % (ref 11.5–15.5)
WBC: 10.3 10*3/uL (ref 4.0–10.5)
nRBC: 0 % (ref 0.0–0.2)

## 2020-05-08 MED ORDER — METOPROLOL SUCCINATE ER 50 MG PO TB24
50.0000 mg | ORAL_TABLET | Freq: Every day | ORAL | Status: DC
  Administered 2020-05-09 – 2020-05-11 (×3): 50 mg via ORAL
  Filled 2020-05-08 (×3): qty 1

## 2020-05-08 MED ORDER — ATORVASTATIN CALCIUM 40 MG PO TABS
40.0000 mg | ORAL_TABLET | Freq: Every day | ORAL | Status: DC
  Administered 2020-05-09 – 2020-05-11 (×3): 40 mg via ORAL
  Filled 2020-05-08 (×3): qty 1

## 2020-05-08 MED ORDER — ACETAMINOPHEN 325 MG PO TABS: 650.0000 mg | ORAL_TABLET | Freq: Four times a day (QID) | ORAL | Status: DC | PRN

## 2020-05-08 MED ORDER — INSULIN ASPART 100 UNIT/ML ~~LOC~~ SOLN
0.0000 [IU] | Freq: Three times a day (TID) | SUBCUTANEOUS | Status: DC
  Administered 2020-05-09: 3 [IU] via SUBCUTANEOUS
  Administered 2020-05-09: 2 [IU] via SUBCUTANEOUS
  Administered 2020-05-09: 1 [IU] via SUBCUTANEOUS
  Administered 2020-05-10: 5 [IU] via SUBCUTANEOUS
  Administered 2020-05-10: 3 [IU] via SUBCUTANEOUS
  Administered 2020-05-10 – 2020-05-11 (×2): 2 [IU] via SUBCUTANEOUS
  Administered 2020-05-11: 3 [IU] via SUBCUTANEOUS
  Filled 2020-05-08 (×2): qty 1

## 2020-05-08 MED ORDER — ONDANSETRON HCL 4 MG PO TABS: 4.0000 mg | ORAL_TABLET | Freq: Four times a day (QID) | ORAL | Status: DC | PRN

## 2020-05-08 MED ORDER — ARIPIPRAZOLE 5 MG PO TABS
15.0000 mg | ORAL_TABLET | Freq: Every day | ORAL | Status: DC
  Administered 2020-05-08 – 2020-05-10 (×3): 15 mg via ORAL
  Filled 2020-05-08 (×3): qty 3

## 2020-05-08 MED ORDER — GABAPENTIN 300 MG PO CAPS
300.0000 mg | ORAL_CAPSULE | Freq: Every day | ORAL | Status: DC
  Administered 2020-05-08: 300 mg via ORAL
  Filled 2020-05-08: qty 1

## 2020-05-08 MED ORDER — INSULIN ASPART 100 UNIT/ML ~~LOC~~ SOLN
0.0000 [IU] | Freq: Every day | SUBCUTANEOUS | Status: DC
  Administered 2020-05-08: 2 [IU] via SUBCUTANEOUS

## 2020-05-08 MED ORDER — ONDANSETRON HCL 4 MG/2ML IJ SOLN: 4.0000 mg | Freq: Four times a day (QID) | INTRAMUSCULAR | Status: DC | PRN

## 2020-05-08 MED ORDER — SODIUM CHLORIDE 0.9 % IV SOLN: INTRAVENOUS | Status: DC

## 2020-05-08 MED ORDER — INSULIN GLARGINE 100 UNIT/ML ~~LOC~~ SOLN
20.0000 [IU] | Freq: Every day | SUBCUTANEOUS | Status: DC
  Administered 2020-05-09 (×2): 20 [IU] via SUBCUTANEOUS
  Filled 2020-05-08 (×7): qty 0.2

## 2020-05-08 MED ORDER — BUPROPION HCL ER (XL) 300 MG PO TB24
300.0000 mg | ORAL_TABLET | Freq: Every morning | ORAL | Status: DC
  Administered 2020-05-09 – 2020-05-11 (×3): 300 mg via ORAL
  Filled 2020-05-08 (×3): qty 1

## 2020-05-08 MED ORDER — SODIUM CHLORIDE 0.9 % IV BOLUS
1000.0000 mL | Freq: Once | INTRAVENOUS | Status: AC
  Administered 2020-05-08: 1000 mL via INTRAVENOUS

## 2020-05-08 MED ORDER — HEPARIN SODIUM (PORCINE) 5000 UNIT/ML IJ SOLN
5000.0000 [IU] | Freq: Three times a day (TID) | INTRAMUSCULAR | Status: DC
  Administered 2020-05-09 – 2020-05-11 (×6): 5000 [IU] via SUBCUTANEOUS
  Filled 2020-05-08 (×6): qty 1

## 2020-05-08 NOTE — H&P (Addendum)
History and Physical    [name] JOI:[id number] DOB: Mar 22, 1966 DOA: 05/08/2020  PCP: [name], DO  Patient coming from: Home  I have personally briefly reviewed patient's old medical records in [place]  Chief Complaint: Elevated BGL  HPI: [name] is a 55 y.o. female with medical history significant of uncontrolled DM2, diabetic neuropathy, HTN, schizophrenia, known persistent microalbuminuria.  Pt reports diarrhea and polydipsia (not really polyuria) for past 2 weeks or so.  BGL has been out of control despite taking meds as instructed.  No CP, no SOB, no cough.  Pt does reports slurred speech: onset yesterday, persistent, nothing makes better or worse.  No associated unilateral weakness.   ED Course: BGL 331 in ED, 243 by time she goes to floor.  concerning however: BUN 53 and Creat 3.0 up significantly from Nov Creat of 1.7 and Oct of 1.3.   Review of Systems: As per HPI, otherwise all review of systems negative.  Past Medical History:  Diagnosis Date  . Charcot's joint of foot, left   . Depression   . Diabetes mellitus without complication ([place])   . Hyperlipidemia   . Hypertension   . Neuromuscular disorder ([place])   . Schizo affective schizophrenia ([place])    abilify and pazil    Past Surgical History:  Procedure Laterality Date  . CHOLECYSTECTOMY    . COLONOSCOPY WITH PROPOFOL N/A 05/26/2019   Procedure: COLONOSCOPY WITH PROPOFOL;  Surgeon: Toledo, Benay Pike, MD;  Location: [facility] ENDOSCOPY;  Service: Gastroenterology;  Laterality: N/A;  . ESOPHAGOGASTRODUODENOSCOPY (EGD) WITH PROPOFOL N/A 05/26/2019   Procedure: ESOPHAGOGASTRODUODENOSCOPY (EGD) WITH PROPOFOL;  Surgeon: Toledo, Benay Pike, MD;  Location: [facility] ENDOSCOPY;  Service: Gastroenterology;  Laterality: N/A;  . IRRIGATION AND DEBRIDEMENT FOOT Right 02/26/2016   Procedure: RIGHT 2ND TOE DEBRIDEMENT;  Surgeon: Samara Deist, DPM;  Location: [facility] ORS;  Service: Podiatry;   Laterality: Right;  . IRRIGATION AND DEBRIDEMENT FOOT Left 12/13/2018   Procedure: IRRIGATION AND DEBRIDEMENT FOOT;  Surgeon: Caroline More, DPM;  Location: [facility] ORS;  Service: Podiatry;  Laterality: Left;     reports that she quit smoking about 11 years ago. Her smoking use included cigarettes. She has a 90.00 pack-year smoking history. She has never used smokeless tobacco. She reports that she does not drink alcohol and does not use drugs.  No Known Allergies  Family History  Problem Relation Age of Onset  . Breast cancer Cousin        maternal cousin  . Hypertension Mother   . Hyperthyroidism Mother   . Dementia Mother   . Prostate cancer Father   . Diabetes Maternal Grandmother   . Hypertension Maternal Grandmother   . Cancer Maternal Grandmother        unknown type  . Diabetes Maternal Grandfather   . Hypertension Maternal Grandfather   . Diabetes Paternal Grandmother   . Hypertension Paternal Grandmother   . Diabetes Paternal Grandfather   . Hypertension Paternal Grandfather      Prior to Admission medications   Medication Sig Start Date End Date Taking? Authorizing Provider  acetaminophen (TYLENOL) 325 MG tablet Take 2 tablets (650 mg total) by mouth every 6 (six) hours as needed for mild pain. 06/04/19  Yes Nicole Kindred A, DO  ARIPiprazole (ABILIFY) 15 MG tablet Take 1 tablet (15 mg total) by mouth at bedtime. 02/26/20  Yes Salley Scarlet, MD  atorvastatin (LIPITOR) 40 MG tablet Take 1 tablet (40 mg total) by mouth daily. 02/26/20  Yes Salley Scarlet, MD  buPROPion (WELLBUTRIN XL) 300 MG 24 hr tablet Take 300 mg by mouth every morning. 04/14/20  Yes [provider]  furosemide (LASIX) 40 MG tablet Take 1 tablet (40 mg total) by mouth daily. 02/26/20  Yes Salley Scarlet, MD  gabapentin (NEURONTIN) 300 MG capsule Take 1 capsule (300 mg total) by mouth at bedtime. 02/26/20  Yes Salley Scarlet, MD  hydrochlorothiazide (HYDRODIURIL) 12.5 MG tablet Take 1  tablet (12.5 mg total) by mouth daily. 02/26/20  Yes Salley Scarlet, MD  LANTUS SOLOSTAR 100 UNIT/ML Solostar Pen INJECT 20-24 UNITS INTO THE SKIN ONCE DAILY. DISCARD AFTER 28 DAYS. Patient taking differently: Inject 25 Units into the skin at bedtime. 05/04/20  Yes Karamalegos, Devonne Doughty, DO  metFORMIN (GLUCOPHAGE) 1000 MG tablet Take 1 tablet (1,000 mg total) by mouth daily with breakfast. 02/27/20  Yes Salley Scarlet, MD  metoprolol succinate (TOPROL-XL) 50 MG 24 hr tablet Take 1 tablet (50 mg total) by mouth daily. Take with or immediately following a meal. 02/26/20  Yes Salley Scarlet, MD  ondansetron (ZOFRAN) 4 MG tablet Take 1 tablet (4 mg total) by mouth every 6 (six) hours. 05/16/19  Yes Wurst, [place], PA-C    Physical Exam: Vitals:   05/08/20 1722 05/08/20 1723 05/08/20 1914  BP: 109/66  123/68  Pulse: 78  75  Resp: 16  16  Temp: 97.6 F (36.4 C)    TempSrc: Oral    SpO2: 100%  100%  Weight:  106.1 kg   Height:  5\' 10"  (1.778 m)     Constitutional: NAD, calm, comfortable Eyes: PERRL, lids and conjunctivae normal ENMT: Mucous membranes are moist. Posterior pharynx clear of any exudate or lesions.Normal dentition.  Neck: normal, supple, no masses, no thyromegaly Respiratory: clear to auscultation bilaterally, no wheezing, no crackles. Normal respiratory effort. No accessory muscle use.  Cardiovascular: Regular rate and rhythm, no murmurs / rubs / gallops. No extremity edema. 2+ pedal pulses. No carotid bruits.  Abdomen: no tenderness, no masses palpated. No hepatosplenomegaly. Bowel sounds positive.  Musculoskeletal: no clubbing / cyanosis. No joint deformity upper and lower extremities. Good ROM, no contractures. Normal muscle tone.  Skin: no rashes, lesions, ulcers. No induration Neurologic: Speech is slurred, tongue protrusion is midline, no facial droop appreciated, no unilateral limb weakness appreciated. Psychiatric: Normal judgment and insight. Alert and oriented  x 3. Normal mood.    Labs on Admission: I have personally reviewed following labs and imaging studies  CBC: Recent Labs  Lab 05/08/20 2054  WBC 10.3  HGB 10.3*  HCT 32.3*  MCV 93.1  PLT 161   Basic Metabolic Panel: Recent Labs  Lab 05/08/20 2054  NA 134*  K 4.4  CL 104  CO2 20*  GLUCOSE 284*  BUN 53*  CREATININE 3.05*  CALCIUM 8.9   GFR: Estimated Creatinine Clearance: 27.1 mL/min (A) (by C-G formula based on SCr of 3.05 mg/dL (H)). Liver Function Tests: No results for input(s): AST, ALT, ALKPHOS, BILITOT, PROT, ALBUMIN in the last 168 hours. No results for input(s): LIPASE, AMYLASE in the last 168 hours. No results for input(s): AMMONIA in the last 168 hours. Coagulation Profile: No results for input(s): INR, PROTIME in the last 168 hours. Cardiac Enzymes: No results for input(s): CKTOTAL, CKMB, CKMBINDEX, TROPONINI in the last 168 hours. BNP (last 3 results) No results for input(s): PROBNP in the last 8760 hours. HbA1C: No results for input(s): HGBA1C in the last 72 hours. CBG:  Recent Labs  Lab 05/08/20 1721 05/08/20 2025  GLUCAP 331* 272*   Lipid Profile: No results for input(s): CHOL, HDL, LDLCALC, TRIG, CHOLHDL, LDLDIRECT in the last 72 hours. Thyroid Function Tests: No results for input(s): TSH, T4TOTAL, FREET4, T3FREE, THYROIDAB in the last 72 hours. Anemia Panel: No results for input(s): VITAMINB12, FOLATE, FERRITIN, TIBC, IRON, RETICCTPCT in the last 72 hours. Urine analysis:    Component Value Date/Time   COLORURINE YELLOW 03/03/2020 2319   APPEARANCEUR HAZY (A) 03/03/2020 2319   APPEARANCEUR Clear 08/19/2012 1204   LABSPEC 1.016 03/03/2020 2319   LABSPEC 1.015 08/19/2012 1204   PHURINE 5.0 03/03/2020 2319   GLUCOSEU 50 (A) 03/03/2020 2319   GLUCOSEU >=500 08/19/2012 1204   HGBUR SMALL (A) 03/03/2020 2319   BILIRUBINUR NEGATIVE 03/03/2020 2319   BILIRUBINUR Negative 12/13/2017 1533   BILIRUBINUR Negative 08/19/2012 [place] 03/03/2020 2319   PROTEINUR 30 (A) 03/03/2020 2319   UROBILINOGEN 0.2 12/13/2017 1533   NITRITE NEGATIVE 03/03/2020 2319   LEUKOCYTESUR MODERATE (A) 03/03/2020 2319   LEUKOCYTESUR Trace 08/19/2012 1204    Radiological Exams on Admission: No results found.  EKG: Independently reviewed.  Assessment/Plan Principal Problem:   AKI (acute kidney injury) ([place]) Active Problems:   DM (diabetes mellitus), type 2, 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HCC)   Essential hypertension   Persistent microalbuminuria associated with type 2 diabetes mellitus ([place])   Schizophrenia ([place])   Slurred speech    1. AKI - 1. Possibly dehydration from uncontrolled DM?  Vs progression of known diabetic nephropathy 2. UA 3. IVF: NS at 125 cc/hr 4. Strict intake and output 5. CT abd/pelvis w/o contrast protocol 6. Hold nephro toxic meds 7. Repeat BMP in AM 8. May warrant renal consult in AM 2. Slurred speech - 1. Checking CT head 2. May need MRI brain tomorrow to r/o stroke 3. HTN - 1. Cont metoprolol 2. Hold HCTZ 4. Schizophrenia - 1. Cont home meds 5. DM2 - 1. Hold metformin 2. Lantus 20u QHS (takes 25 at home) 3. Sensitive SSI AC/HS 6. H/o partial colectomy for colon CA- 1. Missed multiple oncology office visits 2. Will just go ahead and upgrade the US renal to a CT non-contrast abd/pelvis to look at kidneys.  DVT prophylaxis: Heparin [place] Code Status: Full Family Communication: No family in room Disposition Plan: Home after renal function improves Consults called: None Admission status: Admit to inpatient  Severity of Illness: The appropriate patient status for this patient is INPATIENT. Inpatient status is judged to be reasonable and necessary in order to provide the required intensity of service to ensure the patient's safety. The patient's presenting symptoms, physical exam findings, and initial radiographic and laboratory data in the context of their chronic comorbidities  is felt to place them at high risk for further clinical deterioration. Furthermore, it is not anticipated that the patient will be medically stable for discharge from the hospital within 2 midnights of admission. The following factors support the patient status of inpatient.   IP status with creat of 3.0 up from 1.7 in Nov and prior baseline of about 1.3   * I certify that at the point of admission it is my clinical judgment that the patient will require inpatient hospital care spanning beyond 2 midnights from the point of admission due to high intensity of service, high risk for further deterioration and high frequency of surveillance required.*    Trigger Frasier M. DO [facility]  How to contact the [facility] Attending  or Consulting provider [place] or covering provider during after hours [place], for this patient?  1. Check the care team in [facility] and look for a) attending/consulting [facility] provider listed and b) the [facility] team listed 2. Log into [email or web address]  Amion Physician Scheduling and messaging for groups and whole hospitals  On call and physician scheduling software for group practices, residents, hospitalists and other medical providers for call, clinic, rotation and shift schedules. OnCall [employer] is a hospital-wide system for scheduling doctors and paging doctors on call. EasyPlot is for scientific plotting and data analysis.  [email or web address]  and use [place]'s universal password to access. If you do not have the password, please contact the hospital operator.  3. Locate the [facility] provider you are looking for under [facility] and page to a number that you can be directly reached. 4. If you still have difficulty reaching the provider, please page the [facility] (Director on Call) for the Hospitalists listed on amion for assistance.  05/08/2020, 10:55 PM

## 2020-05-08 NOTE — ED Triage Notes (Signed)
Pt presented to ED with high blood sugar. Pt c/o nodding off during conversations and being slow to reaction. BS is 331

## 2020-05-08 NOTE — ED Provider Notes (Signed)
[facility] EMERGENCY DEPARTMENT Provider Note   CSN: [id number] Arrival date & time: 05/08/20  1622     History Chief Complaint  Patient presents with  . Hyperglycemia    [name] is a 55 y.o. female.  Patient presents somewhat groggy.  But her concern is been that she has had high blood sugars.  States she is taking her diabetic medication.  And has not run out of it.  Past medical history significant for schizophrenia or schizoaffective disorder.  Depression.        Past Medical History:  Diagnosis Date  . Charcot's joint of foot, left   . Depression   . Diabetes mellitus without complication ([place])   . Hyperlipidemia   . Hypertension   . Neuromuscular disorder ([place])   . Schizo affective schizophrenia ([place])    abilify and pazil    Patient Active Problem List   Diagnosis Date Noted  . Generalized weakness 03/04/2020  . Schizophrenia ([place]) 02/20/2020  . Chronic diarrhea 11/25/2019  . History of colon cancer 11/25/2019  . Heart palpitations 08/19/2019  . Iron deficiency anemia 07/25/2019  . Diabetes mellitus with polyneuropathy ([place]) 07/17/2019  . S/P partial colectomy 07/17/2019  . AKI (acute kidney injury) ([place]) 07/17/2019  . 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 Anemia   . Hyponatremia 05/23/2019  . Neuropathic foot ulcer, left, with fat layer exposed ([place]) 02/05/2019  . Hyperkalemia 02/05/2019  . Persistent microalbuminuria associated with type 2 diabetes mellitus ([place]) 05/16/2018  . Type 2 diabetes mellitus with hyperglycemia, with long-term current use of insulin ([place]) 05/16/2018  . Morbid obesity ([place]) 05/10/2017  . Long-term use of high-risk medication 05/10/2017  . Hyperlipidemia associated with type 2 diabetes mellitus ([place]) 06/27/2016  . Hammer toe of second toe of right foot 04/20/2016  . Essential hypertension 06/29/2015  . Schizoaffective disorder ([place]) 06/29/2015  . DM (diabetes mellitus), type 2, uncontrolled  w/neurologic complication ([employer]) 22/63/3354  . Polyneuropathy 04/02/2014    Past Surgical History:  Procedure Laterality Date  . CHOLECYSTECTOMY    . COLONOSCOPY WITH PROPOFOL N/A 05/26/2019   Procedure: COLONOSCOPY WITH PROPOFOL;  Surgeon: Toledo, Benay Pike, MD;  Location: [facility] ENDOSCOPY;  Service: Gastroenterology;  Laterality: N/A;  . ESOPHAGOGASTRODUODENOSCOPY (EGD) WITH PROPOFOL N/A 05/26/2019   Procedure: ESOPHAGOGASTRODUODENOSCOPY (EGD) WITH PROPOFOL;  Surgeon: Toledo, Benay Pike, MD;  Location: [facility] ENDOSCOPY;  Service: Gastroenterology;  Laterality: N/A;  . IRRIGATION AND DEBRIDEMENT FOOT Right 02/26/2016   Procedure: RIGHT 2ND TOE DEBRIDEMENT;  Surgeon: Samara Deist, DPM;  Location: [facility] ORS;  Service: Podiatry;  Laterality: Right;  . IRRIGATION AND DEBRIDEMENT FOOT Left 12/13/2018   Procedure: IRRIGATION AND DEBRIDEMENT FOOT;  Surgeon: Caroline More, DPM;  Location: [facility] ORS;  Service: Podiatry;  Laterality: Left;     OB History    Gravida  2   Para  2   Term  2   Preterm      AB      Living  2     SAB      IAB      Ectopic      Multiple      Live Births              Family History  Problem Relation Age of Onset  . Breast cancer Cousin        maternal cousin  . Hypertension Mother   . Hyperthyroidism Mother   . Dementia Mother   . Prostate cancer Father   .  Diabetes Maternal Grandmother   . Hypertension Maternal Grandmother   . Cancer Maternal Grandmother        unknown type  . Diabetes Maternal Grandfather   . Hypertension Maternal Grandfather   . Diabetes Paternal Grandmother   . Hypertension Paternal Grandmother   . Diabetes Paternal Grandfather   . Hypertension Paternal Grandfather     Social History   Tobacco Use  . Smoking status: Former Smoker    Packs/day: 3.00    Years: 30.00    Pack years: 90.00    Types: Cigarettes    Quit date: 05/02/2009    Years since quitting: 11.0  . Smokeless tobacco: Never Used  . Tobacco comment: Pt  reported  quitting in 2011  Vaping Use  . Vaping Use: Never used  Substance Use Topics  . Alcohol use: No  . Drug use: No    Home Medications Prior to Admission medications   Medication Sig Start Date End Date Taking? Authorizing Provider  acetaminophen (TYLENOL) 325 MG tablet Take 2 tablets (650 mg total) by mouth every 6 (six) hours as needed for mild pain. 06/04/19  Yes Nicole Kindred A, DO  ARIPiprazole (ABILIFY) 15 MG tablet Take 1 tablet (15 mg total) by mouth at bedtime. 02/26/20  Yes Salley Scarlet, MD  atorvastatin (LIPITOR) 40 MG tablet Take 1 tablet (40 mg total) by mouth daily. 02/26/20  Yes Salley Scarlet, MD  buPROPion (WELLBUTRIN XL) 300 MG 24 hr tablet Take 300 mg by mouth every morning. 04/14/20  Yes [provider]  furosemide (LASIX) 40 MG tablet Take 1 tablet (40 mg total) by mouth daily. 02/26/20  Yes Salley Scarlet, MD  gabapentin (NEURONTIN) 300 MG capsule Take 1 capsule (300 mg total) by mouth at bedtime. 02/26/20  Yes Salley Scarlet, MD  hydrochlorothiazide (HYDRODIURIL) 12.5 MG tablet Take 1 tablet (12.5 mg total) by mouth daily. 02/26/20  Yes Salley Scarlet, MD  LANTUS SOLOSTAR 100 UNIT/ML Solostar Pen INJECT 20-24 UNITS INTO THE SKIN ONCE DAILY. DISCARD AFTER 28 DAYS. Patient taking differently: Inject 25 Units into the skin at bedtime. 05/04/20  Yes Karamalegos, Devonne Doughty, DO  metFORMIN (GLUCOPHAGE) 1000 MG tablet Take 1 tablet (1,000 mg total) by mouth daily with breakfast. 02/27/20  Yes Salley Scarlet, MD  metoprolol succinate (TOPROL-XL) 50 MG 24 hr tablet Take 1 tablet (50 mg total) by mouth daily. Take with or immediately following a meal. 02/26/20  Yes Salley Scarlet, MD  ondansetron (ZOFRAN) 4 MG tablet Take 1 tablet (4 mg total) by mouth every 6 (six) hours. 05/16/19  Yes Wurst, [place], PA-C    Allergies    Patient has no known allergies.  Review of Systems   Review of Systems  Constitutional: Negative for chills and fever.   HENT: Negative for congestion, rhinorrhea and sore throat.   Eyes: Negative for visual disturbance.  Respiratory: Negative for cough and shortness of breath.   Cardiovascular: Negative for chest pain and leg swelling.  Gastrointestinal: Negative for abdominal pain, diarrhea, nausea and vomiting.  Genitourinary: Negative for dysuria.  Musculoskeletal: Negative for back pain and neck pain.  Skin: Negative for rash.  Neurological: Negative for dizziness, light-headedness and headaches.  Hematological: Does not bruise/bleed easily.  Psychiatric/Behavioral: Negative for confusion.    Physical Exam Updated Vital Signs BP 123/68   Pulse 75   Temp 97.6 F (36.4 C) (Oral)   Resp 16   Ht 1.778 m (5\' 10" )   Wt 106.1 kg  SpO2 100%   BMI 33.58 kg/m   Physical Exam Vitals and nursing note reviewed.  Constitutional:      General: She is not in acute distress.    Appearance: Normal appearance. She is well-developed and well-nourished.  HENT:     Head: Normocephalic and atraumatic.     Mouth/Throat:     Mouth: Mucous membranes are dry.  Eyes:     Extraocular Movements: Extraocular movements intact.     Conjunctiva/sclera: Conjunctivae normal.     Pupils: Pupils are equal, round, and reactive to light.  Cardiovascular:     Rate and Rhythm: Normal rate and regular rhythm.     Heart sounds: No murmur heard.   Pulmonary:     Effort: Pulmonary effort is normal. No respiratory distress.     Breath sounds: Normal breath sounds.  Abdominal:     Palpations: Abdomen is soft.     Tenderness: There is no abdominal tenderness.  Musculoskeletal:        General: No edema. Normal range of motion.     Cervical back: Normal range of motion and neck supple.  Skin:    General: Skin is warm and dry.     Capillary Refill: Capillary refill takes less than 2 seconds.  Neurological:     General: No focal deficit present.     Mental Status: She is alert and oriented to person, place, and time.      Cranial Nerves: No cranial nerve deficit.     Sensory: No sensory deficit.     Motor: No weakness.  Psychiatric:        Mood and Affect: Mood and affect normal.     ED Results / Procedures / Treatments   Labs (all labs ordered are listed, but only abnormal results are displayed) Labs Reviewed  CBC - Abnormal; Notable for the following components:      Result Value   RBC 3.47 (*)    Hemoglobin 10.3 (*)    HCT 32.3 (*)    All other components within normal limits  BASIC METABOLIC PANEL - Abnormal; Notable for the following components:   Sodium 134 (*)    CO2 20 (*)    Glucose, Bld 284 (*)    BUN 53 (*)    Creatinine, Ser 3.05 (*)    GFR, Estimated 17 (*)    All other components within normal limits  CBG MONITORING, ED - Abnormal; Notable for the following components:   Glucose-Capillary 331 (*)    All other components within normal limits  CBG MONITORING, ED - Abnormal; Notable for the following components:   Glucose-Capillary 272 (*)    All other components within normal limits  SARS CORONAVIRUS 2 (TAT 6-24 HRS)  URINALYSIS, ROUTINE W REFLEX MICROSCOPIC  HIV ANTIBODY (ROUTINE TESTING W REFLEX)  HEMOGLOBIN A1C  CBC  BASIC METABOLIC PANEL    EKG None  Radiology No results found.  Procedures Procedures (including critical care time)  Medications Ordered in ED Medications  0.9 %  sodium chloride infusion (has no administration in time range)  metoprolol succinate (TOPROL-XL) 24 hr tablet 50 mg (has no administration in time range)  insulin glargine (LANTUS) injection 20 Units (has no administration in time range)  gabapentin (NEURONTIN) capsule 300 mg (has no administration in time range)  ARIPiprazole (ABILIFY) tablet 15 mg (has no administration in time range)  acetaminophen (TYLENOL) tablet 650 mg (has no administration in time range)  atorvastatin (LIPITOR) tablet 40 mg (has no administration in  time range)  buPROPion (WELLBUTRIN XL) 24 hr tablet 300 mg (has  no administration in time range)  heparin injection 5,000 Units (has no administration in time range)  ondansetron (ZOFRAN) tablet 4 mg (has no administration in time range)    Or  ondansetron (ZOFRAN) injection 4 mg (has no administration in time range)  insulin aspart (novoLOG) injection 0-9 Units (has no administration in time range)  insulin aspart (novoLOG) injection 0-5 Units (has no administration in time range)  sodium chloride 0.9 % bolus 1,000 mL (1,000 mLs Intravenous New Bag/Given 05/08/20 2251)    ED Course  I have reviewed the triage vital signs and the nursing notes.  Pertinent labs & imaging results that were available during my care of the patient were reviewed by me and considered in my medical decision making (see chart for details).    MDM Rules/Calculators/A&P                          Patient became more alert.  Is remained alert.  Patient's initial blood sugar 331.  Was rechecked came down to 287 without intervention.  Basic labs were checked which showed significant elevation in BUN and creatinine.  CO2 was 20.  Patient clinically dry.  Most likely acute kidney injury due to prerenal.  We will give IV fluids.  Contact hospitalist who will admit.  Patient is not sure of her Covid vaccination status.  Not having any Covid symptoms.  Test ordered for admission.     Final Clinical Impression(s) / ED Diagnoses Final diagnoses:  Hyperglycemia  AKI (acute kidney injury) [place])    Rx / DC Orders ED Discharge Orders    None       Fredia Sorrow, MD 05/08/20 2304

## 2020-05-09 ENCOUNTER — Encounter (HOSPITAL_COMMUNITY): Payer: Self-pay | Admitting: Internal Medicine

## 2020-05-09 ENCOUNTER — Inpatient Hospital Stay (HOSPITAL_COMMUNITY): Payer: Medicare Other

## 2020-05-09 DIAGNOSIS — R739 Hyperglycemia, unspecified: Secondary | ICD-10-CM | POA: Diagnosis not present

## 2020-05-09 DIAGNOSIS — E1165 Type 2 diabetes mellitus with hyperglycemia: Secondary | ICD-10-CM

## 2020-05-09 DIAGNOSIS — R4781 Slurred speech: Secondary | ICD-10-CM | POA: Diagnosis present

## 2020-05-09 DIAGNOSIS — F201 Disorganized schizophrenia: Secondary | ICD-10-CM

## 2020-05-09 DIAGNOSIS — E1149 Type 2 diabetes mellitus with other diabetic neurological complication: Secondary | ICD-10-CM

## 2020-05-09 DIAGNOSIS — I10 Essential (primary) hypertension: Secondary | ICD-10-CM

## 2020-05-09 DIAGNOSIS — N179 Acute kidney failure, unspecified: Secondary | ICD-10-CM | POA: Diagnosis not present

## 2020-05-09 DIAGNOSIS — E1129 Type 2 diabetes mellitus with other diabetic kidney complication: Secondary | ICD-10-CM | POA: Diagnosis not present

## 2020-05-09 DIAGNOSIS — R809 Proteinuria, unspecified: Secondary | ICD-10-CM

## 2020-05-09 LAB — HIV ANTIBODY (ROUTINE TESTING W REFLEX): HIV Screen 4th Generation wRfx: NONREACTIVE

## 2020-05-09 LAB — HEMOGLOBIN A1C
Hgb A1c MFr Bld: 10.1 % — ABNORMAL HIGH (ref 4.8–5.6)
Mean Plasma Glucose: 243.17 mg/dL

## 2020-05-09 LAB — CBC
HCT: 28.7 % — ABNORMAL LOW (ref 36.0–46.0)
Hemoglobin: 9.2 g/dL — ABNORMAL LOW (ref 12.0–15.0)
MCH: 30.1 pg (ref 26.0–34.0)
MCHC: 32.1 g/dL (ref 30.0–36.0)
MCV: 93.8 fL (ref 80.0–100.0)
Platelets: 200 10*3/uL (ref 150–400)
RBC: 3.06 MIL/uL — ABNORMAL LOW (ref 3.87–5.11)
RDW: 15.2 % (ref 11.5–15.5)
WBC: 8.3 10*3/uL (ref 4.0–10.5)
nRBC: 0 % (ref 0.0–0.2)

## 2020-05-09 LAB — BASIC METABOLIC PANEL
Anion gap: 6 (ref 5–15)
BUN: 43 mg/dL — ABNORMAL HIGH (ref 6–20)
CO2: 21 mmol/L — ABNORMAL LOW (ref 22–32)
Calcium: 8.5 mg/dL — ABNORMAL LOW (ref 8.9–10.3)
Chloride: 107 mmol/L (ref 98–111)
Creatinine, Ser: 1.95 mg/dL — ABNORMAL HIGH (ref 0.44–1.00)
GFR, Estimated: 30 mL/min — ABNORMAL LOW (ref >60)
Glucose, Bld: 230 mg/dL — ABNORMAL HIGH (ref 70–99)
Potassium: 4.3 mmol/L (ref 3.5–5.1)
Sodium: 134 mmol/L — ABNORMAL LOW (ref 135–145)

## 2020-05-09 LAB — GLUCOSE, CAPILLARY
Glucose-Capillary: 201 mg/dL — ABNORMAL HIGH (ref 70–99)
Glucose-Capillary: 171 mg/dL — ABNORMAL HIGH (ref 70–99)
Glucose-Capillary: 137 mg/dL — ABNORMAL HIGH (ref 70–99)
Glucose-Capillary: 180 mg/dL — ABNORMAL HIGH (ref 70–99)

## 2020-05-09 MED ORDER — GABAPENTIN 100 MG PO CAPS
100.0000 mg | ORAL_CAPSULE | Freq: Every day | ORAL | Status: DC
  Administered 2020-05-09 – 2020-05-10 (×2): 100 mg via ORAL
  Filled 2020-05-09 (×2): qty 1

## 2020-05-09 MED ORDER — INFLUENZA VAC SPLIT QUAD 0.5 ML IM SUSY
0.5000 mL | PREFILLED_SYRINGE | INTRAMUSCULAR | Status: AC
  Administered 2020-05-10: 0.5 mL via INTRAMUSCULAR
  Filled 2020-05-09: qty 0.5

## 2020-05-09 MED ORDER — INSULIN ASPART 100 UNIT/ML ~~LOC~~ SOLN
5.0000 [IU] | Freq: Three times a day (TID) | SUBCUTANEOUS | Status: DC
  Administered 2020-05-09 (×3): 5 [IU] via SUBCUTANEOUS

## 2020-05-09 NOTE — Progress Notes (Signed)
PROGRESS NOTE   [name]  NTI:[id number] DOB: [birth year] DOA: 05/08/2020 PCP: [name], DO   Chief Complaint  Patient presents with  . Hyperglycemia    Brief Admission History:  55 year old female with schizophrenia, hypertension, diabetic neuropathy and uncontrolled type 2 diabetes mellitus presents with malaise hyperglycemia and acute kidney injury.  Assessment & Plan:   Principal Problem:   AKI (acute kidney injury) ([place]) Active Problems:   DM (diabetes mellitus), type 2, 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[place])   Essential hypertension   Persistent microalbuminuria associated with type 2 diabetes mellitus ([place])   Schizophrenia ([place])   Slurred speech  1. Acute renal failure - likely prerenal in setting of dehydration and hyperglycemia - treating supportively with IV fluids.  2. Uncontrolled type 2 diabetes mellitus with hyperglycemia - added prandial insulin coverage, continue basal and SSI. Titrate doses as needed for better glycemic control.  3. Schizophrenia - stable, resumed home meds.  4. Essential hypertension - BPs well controlled.  Following.  5. H/O colon cancer s/p partial colectomy - CT abdomen with no findings of recurrent cancer.   DVT prophylaxis:  Heparin  Code Status: full  Family Communication: t/c no answer  Disposition: Home   Status is: Inpatient  Remains inpatient appropriate because:IV treatments appropriate due to intensity of illness or inability to take PO and Inpatient level of care appropriate due to severity of illness  Dispo: The patient is from: Home              Anticipated d/c is to: Home              Anticipated d/c date is: 2 days              Patient currently is not medically stable to d/c. Consultants:     Procedures:     Antimicrobials:     Subjective: Pt reports that she feels weak and she has a lot of things wrong with her.    Objective: Vitals:   05/08/20 2252 05/08/20 2344 05/09/20  0336 05/09/20 0750  BP:  121/71 122/84 132/76  Pulse:  85 83 86  Resp:  18 19 20   Temp:  97.7 F (36.5 C) 97.8 F (36.6 C) 97.8 F (36.6 C)  TempSrc:  Oral Oral Oral  SpO2: 100% 100% 99% 99%  Weight:      Height:        Intake/Output Summary (Last 24 hours) at 05/09/2020 1210 Last data filed at 05/09/2020 1020 Gross per 24 hour  Intake 919.35 ml  Output --  Net 919.35 ml   Filed Weights   05/08/20 1723  Weight: 106.1 kg   Examination:  General exam: normal s1 s2 soundsAppears calm and comfortable  Respiratory system: Clear to auscultation. Respiratory effort normal. Cardiovascular system: S1 & S2 heard. No JVD, murmurs, rubs, gallops or clicks. No pedal edema. Gastrointestinal system: Abdomen is nondistended, soft and nontender. No organomegaly or masses felt. Normal bowel sounds heard. Central nervous system: Alert and oriented. No focal neurological deficits. Extremities: Symmetric 5 x 5 power. Skin: No rashes, lesions or ulcers Psychiatry: Judgement and insight appear normal. Mood & affect appropriate.   Data Reviewed: I have personally reviewed following labs and imaging studies  CBC: Recent Labs  Lab 05/08/20 2054  WBC 10.3  HGB 10.3*  HCT 32.3*  MCV 93.1  PLT 867    Basic Metabolic Panel: Recent Labs  Lab 05/08/20 2054  NA 134*  K 4.4  CL 104  CO2 20*  GLUCOSE 284*  BUN 53*  CREATININE 3.05*  CALCIUM 8.9    GFR: Estimated Creatinine Clearance: 27.1 mL/min (A) (by C-G formula based on SCr of 3.05 mg/dL (H)).  Liver Function Tests: No results for input(s): AST, ALT, ALKPHOS, BILITOT, PROT, ALBUMIN in the last 168 hours.  CBG: Recent Labs  Lab 05/08/20 1721 05/08/20 2025 05/08/20 2310 05/09/20 0745 05/09/20 1106  GLUCAP 331* 272* 243* 201* 171*    No results found for this or any previous visit (from the past 240 hour(s)).   Radiology Studies: CT ABDOMEN PELVIS WO CONTRAST  Result Date: 05/09/2020 CLINICAL DATA:  Diffuse abdominal  pain. Personal history of colon carcinoma. Acute kidney injury. EXAM: CT ABDOMEN AND PELVIS WITHOUT CONTRAST TECHNIQUE: Multidetector CT imaging of the abdomen and pelvis was performed following the standard protocol without IV contrast. COMPARISON:  07/17/2019 from [facility] FINDINGS: Lower chest: No acute findings. Hepatobiliary: No mass visualized on this unenhanced exam. Prior cholecystectomy. No evidence of biliary obstruction. Pancreas: No mass or inflammatory process visualized on this unenhanced exam. Spleen:  Within normal limits in size. Adrenals/Urinary tract: No evidence of urolithiasis or hydronephrosis. Unremarkable unopacified urinary bladder. Stomach/Bowel: Surgical anastomosis again seen in the sigmoid colon. No mass identified. No evidence of obstruction, inflammatory process, or abnormal fluid collections. Vascular/Lymphatic: No pathologically enlarged lymph nodes identified. No evidence of abdominal aortic aneurysm. Aortic atherosclerotic calcification noted. Reproductive: Normal appearance of uterus. A 4.0 x 3.3 cm right ovarian mass containing macroscopic fat is seen, consistent with a benign ovarian dermoid. This remains stable since prior exam. No other masses identified. No evidence of inflammatory process or abnormal fluid collections. Other:  None. Musculoskeletal:  No suspicious bone lesions identified. IMPRESSION: No acute findings. No evidence of recurrent or metastatic carcinoma. 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 If not surgically resected, recommend annual imaging follow-up. Aortic Atherosclerosis (ICD10-I70.0). Electronically Signed   By: Marlaine Hind M.D.   On: 05/09/2020 11:18   CT HEAD WO CONTRAST  Result Date: 05/09/2020 CLINICAL DATA:  Neuro deficit with stroke suspected. Slurred speech for approximately 24 hours. EXAM: CT HEAD WITHOUT CONTRAST TECHNIQUE: Contiguous axial images were obtained from the base of the skull through the vertex without intravenous  contrast. COMPARISON:  None. FINDINGS: Brain: Remote appearing infarct with low density and volume loss in the left parietal region. No acute infarct, hemorrhage, hydrocephalus, or collection. Vascular: No hyperdense vessel or unexpected calcification. Skull: Normal. Negative for fracture or focal lesion. Sinuses/Orbits: No acute finding. IMPRESSION: 1. No acute finding. 2. Small remote left parietal infarct. Electronically Signed   By: Monte Fantasia M.D.   On: 05/09/2020 10:03   Scheduled Meds: . ARIPiprazole  15 mg Oral QHS  . atorvastatin  40 mg Oral Daily  . buPROPion  300 mg Oral q morning - 10a  . gabapentin  100 mg Oral QHS  . heparin  5,000 Units Subcutaneous Q8H  . [START ON 05/10/2020] influenza vac split quadrivalent PF  0.5 mL Intramuscular Tomorrow-1000  . insulin aspart  0-5 Units Subcutaneous QHS  . insulin aspart  0-9 Units Subcutaneous TID WC  . insulin aspart  5 Units Subcutaneous TID WC  . insulin glargine  20 Units Subcutaneous QHS  . metoprolol succinate  50 mg Oral Daily   Continuous Infusions: . sodium chloride 125 mL/hr at 05/09/20 1206    LOS: 1 day   Time spent: 25 mins   Kalissa Grays Wynetta Emery, MD How to contact the [facility] Attending  or Consulting provider [place] or covering provider during after hours [place], for this patient?  1. Check the care team in [facility] and look for a) attending/consulting TRH provider listed and b) the [facility] team listed 2. Log into [email or web address] and use [place]'s universal password to access. If you do not have the password, please contact the hospital operator. 3. Locate the [facility] provider you are looking for under [facility] and page to a number that you can be directly reached. 4. If you still have difficulty reaching the provider, please page the [facility] (Director on Call) for the Hospitalists listed on amion for assistance.  05/09/2020, 12:10 PM

## 2020-05-10 DIAGNOSIS — I10 Essential (primary) hypertension: Secondary | ICD-10-CM | POA: Diagnosis not present

## 2020-05-10 DIAGNOSIS — E1129 Type 2 diabetes mellitus with other diabetic kidney complication: Secondary | ICD-10-CM | POA: Diagnosis not present

## 2020-05-10 DIAGNOSIS — N179 Acute kidney failure, unspecified: Secondary | ICD-10-CM | POA: Diagnosis not present

## 2020-05-10 DIAGNOSIS — E1149 Type 2 diabetes mellitus with other diabetic neurological complication: Secondary | ICD-10-CM | POA: Diagnosis not present

## 2020-05-10 DIAGNOSIS — R739 Hyperglycemia, unspecified: Secondary | ICD-10-CM | POA: Diagnosis not present

## 2020-05-10 LAB — RENAL FUNCTION PANEL
Albumin: 3.3 g/dL — ABNORMAL LOW (ref 3.5–5.0)
Anion gap: 7 (ref 5–15)
BUN: 28 mg/dL — ABNORMAL HIGH (ref 6–20)
CO2: 22 mmol/L (ref 22–32)
Calcium: 9.1 mg/dL (ref 8.9–10.3)
Chloride: 109 mmol/L (ref 98–111)
Creatinine, Ser: 1.34 mg/dL — ABNORMAL HIGH (ref 0.44–1.00)
GFR, Estimated: 47 mL/min — ABNORMAL LOW (ref >60)
Glucose, Bld: 249 mg/dL — ABNORMAL HIGH (ref 70–99)
Phosphorus: 2.5 mg/dL (ref 2.5–4.6)
Potassium: 4.5 mmol/L (ref 3.5–5.1)
Sodium: 138 mmol/L (ref 135–145)

## 2020-05-10 LAB — SARS CORONAVIRUS 2 (TAT 6-24 HRS): SARS Coronavirus 2: NEGATIVE

## 2020-05-10 LAB — GLUCOSE, CAPILLARY
Glucose-Capillary: 207 mg/dL — ABNORMAL HIGH (ref 70–99)
Glucose-Capillary: 264 mg/dL — ABNORMAL HIGH (ref 70–99)
Glucose-Capillary: 163 mg/dL — ABNORMAL HIGH (ref 70–99)
Glucose-Capillary: 165 mg/dL — ABNORMAL HIGH (ref 70–99)

## 2020-05-10 MED ORDER — INSULIN ASPART 100 UNIT/ML ~~LOC~~ SOLN
6.0000 [IU] | Freq: Three times a day (TID) | SUBCUTANEOUS | Status: DC
  Administered 2020-05-10: 6 [IU] via SUBCUTANEOUS

## 2020-05-10 MED ORDER — INSULIN ASPART 100 UNIT/ML ~~LOC~~ SOLN
8.0000 [IU] | Freq: Three times a day (TID) | SUBCUTANEOUS | Status: DC
  Administered 2020-05-10 – 2020-05-11 (×4): 8 [IU] via SUBCUTANEOUS

## 2020-05-10 MED ORDER — INSULIN GLARGINE 100 UNIT/ML ~~LOC~~ SOLN
24.0000 [IU] | Freq: Every day | SUBCUTANEOUS | Status: DC
  Administered 2020-05-10: 24 [IU] via SUBCUTANEOUS
  Filled 2020-05-10 (×2): qty 0.24

## 2020-05-10 MED ORDER — INSULIN GLARGINE 100 UNIT/ML ~~LOC~~ SOLN
22.0000 [IU] | Freq: Every day | SUBCUTANEOUS | Status: DC
  Filled 2020-05-10: qty 0.22

## 2020-05-10 NOTE — Progress Notes (Signed)
PROGRESS NOTE   [name]  WLN:[id number] DOB: 05-07-1965 DOA: 05/08/2020 PCP: [name], DO   Chief Complaint  Patient presents with  . Hyperglycemia    Brief Admission History:  55 year old female with schizophrenia, hypertension, diabetic neuropathy and uncontrolled type 2 diabetes mellitus presents with malaise hyperglycemia and acute kidney injury.  Assessment & Plan:   Principal Problem:   AKI (acute kidney injury) ([place]) Active Problems:   DM (diabetes mellitus), type 2, 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[place])   Essential hypertension   Persistent microalbuminuria associated with type 2 diabetes mellitus ([place])   Schizophrenia ([place])   Slurred speech  1. Acute renal failure - IMPROVING. Likely prerenal in setting of dehydration and hyperglycemia - treating supportively with IV fluids.  2. Uncontrolled type 2 diabetes mellitus with hyperglycemia - added prandial insulin coverage, continue basal and SSI. Titrate doses as needed for better glycemic control.  3. Schizophrenia - stable, resumed home meds.  4. Essential hypertension - BPs well controlled.  Following.  5. H/O colon cancer s/p partial colectomy - CT abdomen with no findings of recurrent cancer.   DVT prophylaxis:  Heparin  Code Status: full  Family Communication: t/c no answer  Disposition: Home   Status is: Inpatient  Remains inpatient appropriate because:IV treatments appropriate due to intensity of illness or inability to take PO and Inpatient level of care appropriate due to severity of illness  Dispo: The patient is from: Home              Anticipated d/c is to: Home              Anticipated d/c date is: 1 day              Patient currently is not medically stable to d/c. Consultants:     Procedures:     Antimicrobials:     Subjective: Pt says that her appetite is improving   Objective: Vitals:   05/09/20 1211 05/09/20 1408 05/09/20 2142 05/10/20 0549  BP: (!)  113/95 119/66 (!) 146/80 (!) 141/66  Pulse: 96 78 88 82  Resp:  20 19 20   Temp: 98.6 F (37 C) 98 F (36.7 C) 98.4 F (36.9 C) 98.4 F (36.9 C)  TempSrc:      SpO2: 100% 100% 98% 98%  Weight:      Height:        Intake/Output Summary (Last 24 hours) at 05/10/2020 1132 Last data filed at 05/09/2020 1700 Gross per 24 hour  Intake 600 ml  Output --  Net 600 ml   Filed Weights   05/08/20 1723  Weight: 106.1 kg   Examination:  General exam: normal s1 s2 soundsAppears calm and comfortable dry mucus membrane  Respiratory system: Clear to auscultation. Respiratory effort normal. Cardiovascular system: S1 & S2 heard. No JVD, murmurs, rubs, gallops or clicks. No pedal edema. Gastrointestinal system: Abdomen is nondistended, soft and nontender. No organomegaly or masses felt. Normal bowel sounds heard. Central nervous system: Alert and oriented. No focal neurological deficits. Extremities: Symmetric 5 x 5 power. Skin: No rashes, lesions or ulcers Psychiatry: Judgement and insight appear normal. Mood & affect appropriate.   Data Reviewed: I have personally reviewed following labs and imaging studies  CBC: Recent Labs  Lab 05/08/20 2054 05/09/20 0500  WBC 10.3 8.3  HGB 10.3* 9.2*  HCT 32.3* 28.7*  MCV 93.1 93.8  PLT 228 740    Basic Metabolic Panel: Recent Labs  Lab 05/08/20 2054 05/09/20 0500 05/10/20  0813  NA 134* 134* 138  K 4.4 4.3 4.5  CL 104 107 109  CO2 20* 21* 22  GLUCOSE 284* 230* 249*  BUN 53* 43* 28*  CREATININE 3.05* 1.95* 1.34*  CALCIUM 8.9 8.5* 9.1  PHOS  --   --  2.5    GFR: Estimated Creatinine Clearance: 61.8 mL/min (A) (by C-G formula based on SCr of 1.34 mg/dL (H)).  Liver Function Tests: Recent Labs  Lab 05/10/20 0813  ALBUMIN 3.3*    CBG: Recent Labs  Lab 05/09/20 1106 05/09/20 1640 05/09/20 2055 05/10/20 0712 05/10/20 1113  GLUCAP 171* 137* 180* 207* 264*    Recent Results (from the past 240 hour(s))  SARS CORONAVIRUS 2  (TAT 6-24 HRS) Nasopharyngeal Nasopharyngeal Swab     Status: None   Collection Time: 05/08/20 11:25 PM   Specimen: Nasopharyngeal Swab  Result Value Ref Range Status   SARS Coronavirus 2 NEGATIVE NEGATIVE Final    Comment: (NOTE) SARS-CoV-2 target nucleic acids are NOT DETECTED.  The SARS-CoV-2 RNA is generally detectable in upper and lower respiratory specimens during the acute phase of infection. Negative results do not preclude SARS-CoV-2 infection, do not rule out co-infections with other pathogens, and should not be used as the sole basis for treatment or other patient management decisions. Negative results must be combined with clinical observations, patient history, and epidemiological information. The expected result is Negative.  Fact Sheet for Patients: SugarRoll.be  Fact Sheet for Healthcare Providers: https://www.woods-mathews.com/  This test is not yet approved or cleared by the [place] FDA and  has been authorized for detection and/or diagnosis of SARS-CoV-2 by FDA under an Emergency Use Authorization (EUA). This EUA will remain  in effect (meaning this test can be used) for the duration of the COVID-19 declaration under Se ction 564(b)(1) of the Act, 21 U.S.C. section 360bbb-3(b)(1), unless the authorization is terminated or revoked sooner.  Performed at [facility], [place] [address], [place], [place] [address]      Radiology Studies: CT ABDOMEN PELVIS WO CONTRAST  Result Date: 05/09/2020 CLINICAL DATA:  Diffuse abdominal pain. Personal history of colon carcinoma. Acute kidney injury. EXAM: CT ABDOMEN AND PELVIS WITHOUT CONTRAST TECHNIQUE: Multidetector CT imaging of the abdomen and pelvis was performed following the standard protocol without IV contrast. COMPARISON:  07/17/2019 from [facility] FINDINGS: Lower chest: No acute findings. Hepatobiliary: No mass visualized on this unenhanced exam. Prior  cholecystectomy. No evidence of biliary obstruction. Pancreas: No mass or inflammatory process visualized on this unenhanced exam. Spleen:  Within normal limits in size. Adrenals/Urinary tract: No evidence of urolithiasis or hydronephrosis. Unremarkable unopacified urinary bladder. Stomach/Bowel: Surgical anastomosis again seen in the sigmoid colon. No mass identified. No evidence of obstruction, inflammatory process, or abnormal fluid collections. Vascular/Lymphatic: No pathologically enlarged lymph nodes identified. No evidence of abdominal aortic aneurysm. Aortic atherosclerotic calcification noted. Reproductive: Normal appearance of uterus. A 4.0 x 3.3 cm right ovarian mass containing macroscopic fat is seen, consistent with a benign ovarian dermoid. This remains stable since prior exam. No other masses identified. No evidence of inflammatory process or abnormal fluid collections. Other:  None. Musculoskeletal:  No suspicious bone lesions identified. IMPRESSION: No acute findings. No evidence of recurrent or metastatic carcinoma. 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 If not surgically resected, recommend annual imaging follow-up. Aortic Atherosclerosis (ICD10-I70.0). Electronically Signed   By: Marlaine Hind M.D.   On: 05/09/2020 11:18   CT HEAD WO CONTRAST  Result Date: 05/09/2020 CLINICAL  DATA:  Neuro deficit with stroke suspected. Slurred speech for approximately 24 hours. EXAM: CT HEAD WITHOUT CONTRAST TECHNIQUE: Contiguous axial images were obtained from the base of the skull through the vertex without intravenous contrast. COMPARISON:  None. FINDINGS: Brain: Remote appearing infarct with low density and volume loss in the left parietal region. No acute infarct, hemorrhage, hydrocephalus, or collection. Vascular: No hyperdense vessel or unexpected calcification. Skull: Normal. Negative for fracture or focal lesion. Sinuses/Orbits: No acute finding. IMPRESSION: 1. No acute finding. 2. Small remote left  parietal infarct. Electronically Signed   By: Monte Fantasia M.D.   On: 05/09/2020 10:03   Scheduled Meds: . ARIPiprazole  15 mg Oral QHS  . atorvastatin  40 mg Oral Daily  . buPROPion  300 mg Oral q morning - 10a  . gabapentin  100 mg Oral QHS  . heparin  5,000 Units Subcutaneous Q8H  . insulin aspart  0-5 Units Subcutaneous QHS  . insulin aspart  0-9 Units Subcutaneous TID WC  . insulin aspart  6 Units Subcutaneous TID WC  . insulin glargine  22 Units Subcutaneous QHS  . metoprolol succinate  50 mg Oral Daily   Continuous Infusions: . sodium chloride 75 mL/hr at 05/10/20 0842    LOS: 2 days   Time spent: 36 mins   Florella Mcneese Wynetta Emery, MD How to contact the [facility] Attending or Consulting provider [place] or covering provider during after hours [place], for this patient?  1. Check the care team in [facility] and look for a) attending/consulting TRH provider listed and b) the [facility] team listed 2. Log into [email or web address] and use [place]'s universal password to access. If you do not have the password, please contact the hospital operator. 3. Locate the [facility] provider you are looking for under [facility] and page to a number that you can be directly reached. 4. If you still have difficulty reaching the provider, please page the [facility] (Director on Call) for the Hospitalists listed on amion for assistance.  05/10/2020, 11:32 AM

## 2020-05-10 NOTE — TOC Initial Note (Signed)
Transition of Care [facility]) - Initial/Assessment Note    Patient Details  Name: [name] MRN: [id number] Date of Birth: October 13, 1965  Transition of [facility]) CM/SW Contact:    Natasha Bence, LCSW Phone Number: 05/10/2020, 3:05 PM  Clinical Narrative:                 Patient is a 55 year old female admitted for Acute Kidney Injury. 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 Patient's daughter reported that the patient's PCP had recommended an ALF for patient, but they are currently still looking of a facility. Patient is agreeable to [facility] of SNF referral if recommended by MD. Patient has Hx with [facility]. TOC to follow.   Expected Discharge Plan: [place] Barriers to Discharge: Continued Medical Work up   Patient Goals and CMS Choice Patient states their goals for this hospitalization and ongoing recovery are:: Return home with [facility] CMS Medicare.gov Compare Post Acute Care list provided to:: Patient Choice offered to / list presented to : Patient  Expected Discharge Plan and Services Expected Discharge Plan: [place]   Discharge Planning Services: NA   Living arrangements for the past 2 months: Single Family Home                 DME Arranged: N/A DME Agency: NA                  Prior Living Arrangements/Services Living arrangements for the past 2 months: Single Family Home Lives with:: Self Patient language and need for interpreter reviewed:: Yes Do you feel safe going back to the place where you live?: Yes      Need for Family Participation in Patient Care: Yes (Comment) Care giver support system in place?: Yes (comment)   Criminal Activity/Legal Involvement Pertinent to Current Situation/Hospitalization: No - Comment as needed  Activities of Daily Living Home Assistive Devices/Equipment: Wheelchair,Walker (specify type),Cane (specify quad or  straight),Eyeglasses,Shower chair with back (rolling walker) ADL Screening (condition at time of admission) Patient's cognitive ability adequate to safely complete daily activities?: Yes Is the patient deaf or have difficulty hearing?: Yes Does the patient have difficulty seeing, even when wearing glasses/contacts?: Yes Does the patient have difficulty concentrating, remembering, or making decisions?: Yes Patient able to express need for assistance with ADLs?: Yes Does the patient have difficulty dressing or bathing?: No Independently performs ADLs?: Yes (appropriate for developmental age) Does the patient have difficulty walking or climbing stairs?: Yes Weakness of Legs: Both Weakness of Arms/Hands: Both  Permission Sought/Granted   Permission granted to share information with : Yes, Verbal Permission Granted              Emotional Assessment     Affect (typically observed): Accepting,Adaptable Orientation: : Oriented to Self,Oriented to Place,Oriented to  Time,Oriented to Situation Alcohol / Substance Use: Not Applicable Psych Involvement: No (comment)  Admission diagnosis:  Hyperglycemia [R73.9] AKI (acute kidney injury) ([place]) [N17.9] Patient Active Problem List   Diagnosis Date Noted  . Slurred speech 05/09/2020  . Generalized weakness 03/04/2020  . Schizophrenia ([place]) 02/20/2020  . Chronic diarrhea 11/25/2019  . History of colon cancer 11/25/2019  . Heart palpitations 08/19/2019  . Iron deficiency anemia 07/25/2019  . Diabetes mellitus with polyneuropathy ([place]) 07/17/2019  . S/P partial colectomy 07/17/2019  . AKI (acute kidney injury) ([place]) 07/17/2019  . 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[place]) 05/28/2019  . Anemia   .  Hyponatremia 05/23/2019  . Neuropathic foot ulcer, left, with fat layer exposed ([place]) 02/05/2019  . Hyperkalemia 02/05/2019  . Persistent microalbuminuria associated with type 2 diabetes mellitus ([place]) 05/16/2018  . Type 2 diabetes mellitus with  hyperglycemia, with long-term current use of insulin ([place]) 05/16/2018  . Morbid obesity ([place]) 05/10/2017  . Long-term use of high-risk medication 05/10/2017  . Hyperlipidemia associated with type 2 diabetes mellitus ([place]) 06/27/2016  . Hammer toe of second toe of right foot 04/20/2016  . Essential hypertension 06/29/2015  . Schizoaffective disorder ([place]) 06/29/2015  . DM (diabetes mellitus), type 2, 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[place]) 44/97/5300  . Polyneuropathy 04/02/2014   PCP:  [name], DO Pharmacy:   [facility], [place] [address] [place] [address] Phone: [phone] Fax: [phone] - [place], [place] [address] [place] [address] Phone: [phone] Fax: [phone]     Social Determinants of Health (SDOH) Interventions    Readmission Risk Interventions Readmission Risk Prevention Plan 05/10/2020 05/24/2019 10/21/2018  Post Dischage Appt - - Complete  Medication Screening - - Complete  Transportation Screening Complete Complete Complete  PCP or Specialist Appt within 5-7 Days - - -  Home Care Screening - - -  Medication Review (RN CM) - - -  Medication Review (RN Transport planner) Complete Complete -  PCP or Specialist appointment within 3-5 days of discharge - Complete -  [place] or Home Care Consult Complete (No Data) -  SW Recovery Care/Counseling Consult Complete - -  Palliative Care Screening Not Applicable - -  [place] - Not Applicable -  Some recent data might be hidden

## 2020-05-11 ENCOUNTER — Other Ambulatory Visit: Payer: Self-pay | Admitting: Family Medicine

## 2020-05-11 DIAGNOSIS — E1129 Type 2 diabetes mellitus with other diabetic kidney complication: Secondary | ICD-10-CM | POA: Diagnosis not present

## 2020-05-11 DIAGNOSIS — R739 Hyperglycemia, unspecified: Secondary | ICD-10-CM | POA: Diagnosis not present

## 2020-05-11 DIAGNOSIS — N179 Acute kidney failure, unspecified: Secondary | ICD-10-CM | POA: Diagnosis not present

## 2020-05-11 DIAGNOSIS — E1149 Type 2 diabetes mellitus with other diabetic neurological complication: Secondary | ICD-10-CM | POA: Diagnosis not present

## 2020-05-11 DIAGNOSIS — Z23 Encounter for immunization: Secondary | ICD-10-CM | POA: Diagnosis not present

## 2020-05-11 DIAGNOSIS — F251 Schizoaffective disorder, depressive type: Secondary | ICD-10-CM

## 2020-05-11 DIAGNOSIS — I10 Essential (primary) hypertension: Secondary | ICD-10-CM | POA: Diagnosis not present

## 2020-05-11 LAB — RENAL FUNCTION PANEL
Albumin: 3.3 g/dL — ABNORMAL LOW (ref 3.5–5.0)
Anion gap: 8 (ref 5–15)
BUN: 21 mg/dL — ABNORMAL HIGH (ref 6–20)
CO2: 21 mmol/L — ABNORMAL LOW (ref 22–32)
Calcium: 9 mg/dL (ref 8.9–10.3)
Chloride: 108 mmol/L (ref 98–111)
Creatinine, Ser: 1.09 mg/dL — ABNORMAL HIGH (ref 0.44–1.00)
GFR, Estimated: 60 mL/min — ABNORMAL LOW (ref >60)
Glucose, Bld: 193 mg/dL — ABNORMAL HIGH (ref 70–99)
Phosphorus: 2.3 mg/dL — ABNORMAL LOW (ref 2.5–4.6)
Potassium: 4.6 mmol/L (ref 3.5–5.1)
Sodium: 137 mmol/L (ref 135–145)

## 2020-05-11 LAB — GLUCOSE, CAPILLARY
Glucose-Capillary: 161 mg/dL — ABNORMAL HIGH (ref 70–99)
Glucose-Capillary: 218 mg/dL — ABNORMAL HIGH (ref 70–99)
Glucose-Capillary: 230 mg/dL — ABNORMAL HIGH (ref 70–99)

## 2020-05-11 MED ORDER — ARIPIPRAZOLE 15 MG PO TABS: 15.0000 mg | ORAL_TABLET | Freq: Every day | ORAL | 0 refills | Status: DC

## 2020-05-11 MED ORDER — ARIPIPRAZOLE 15 MG PO TABS
15.0000 mg | ORAL_TABLET | Freq: Every day | ORAL | 0 refills | Status: DC
Start: 2020-05-11 — End: 2020-06-09

## 2020-05-11 MED ORDER — LANTUS SOLOSTAR 100 UNIT/ML ~~LOC~~ SOPN: 25.0000 [IU] | PEN_INJECTOR | Freq: Every day | SUBCUTANEOUS | Status: DC

## 2020-05-11 NOTE — Telephone Encounter (Signed)
Future visit scheduled: no  Notes to clinic: review for refills One medication cannot be delegated Other medication filled by historical provider    Requested Prescriptions  Pending Prescriptions Disp Refills   ARIPiprazole (ABILIFY) 10 MG tablet [Pharmacy Med Name: ARIPIPRAZOLE 10 MG TABLET] 30 tablet 0    Sig: TAKE 1 TABLET BY MOUTH AT BEDTIME.      Not Delegated - Psychiatry:  Antipsychotics - Second Generation (Atypical) - aripiprazole Failed - 05/11/2020  8:38 AM      Failed - This refill cannot be delegated      Passed - Valid encounter within last 6 months    Recent Outpatient Visits           3 weeks ago Schizoaffective disorder, depressive type [facility])   [facility] [name], DO   9 months ago Adenocarcinoma of sigmoid colon [facility])   [facility] [name], DO   10 months ago Essential hypertension   [facility] [name], DO   1 year ago Essential hypertension   [place], DO   1 year ago Charcot's joint of left foot   [facility], Devonne Doughty, DO                  buPROPion (WELLBUTRIN XL) 300 MG 24 hr tablet [Pharmacy Med Name: BUPROPION HCL XL 300 MG TABLET] 30 tablet 0    Sig: TAKE 1 TABLET BY MOUTH EACH MORNING.      Psychiatry: Antidepressants - bupropion Failed - 05/11/2020  8:38 AM      Failed - Last BP in normal range    BP Readings from Last 1 Encounters:  05/11/20 (!) 156/74          Passed - Valid encounter within last 6 months    Recent Outpatient Visits           3 weeks ago Schizoaffective disorder, depressive type [facility])   [place], DO   9 months ago Adenocarcinoma of sigmoid colon [facility])   [place], DO   10 months ago Essential hypertension   [facility] [name], DO   1 year ago Essential hypertension   [place], DO   1 year ago Charcot's joint of left foot   [place], DO                  colestipol (COLESTID) 1 g tablet [Pharmacy Med Name: COLESTIPOL HCL 1 GM TABLET] 120 tablet 0    Sig: TAKE (2) TABLETS BY MOUTH TWICE DAILY.      Cardiovascular:  Antilipid - Bile Acid Sequestrants Failed - 05/11/2020  8:38 AM      Failed - Triglycerides in normal range and within 360 days    Triglycerides  Date Value Ref Range Status  02/23/2020 275 (H) <150 mg/dL Final          Passed - Total Cholesterol in normal range and within 360 days    Cholesterol  Date Value Ref Range Status  02/23/2020 175 0 - 200 mg/dL Final          Passed - LDL in normal range and within 360 days    LDL Cholesterol (Calc)  Date Value Ref Range Status  11/07/2017 135 (H) mg/dL (  calc) Final    Comment:    Reference range: <100 . Desirable range <100 mg/dL for primary prevention;   <70 mg/dL for patients with CHD or diabetic patients  with > or = 2 CHD risk factors. [name] LDL-C is now calculated using the Martin-Hopkins  calculation, which is a validated novel method providing  better accuracy than the Friedewald equation in the  estimation of LDL-C.  Cresenciano Genre et al. Annamaria Helling. 2202;542(70): 2061-2068  (http://education.Quest[employer].com/faq/FAQ164)    LDL Cholesterol  Date Value Ref Range Status  02/23/2020 78 0 - 99 mg/dL Final    Comment:           Total Cholesterol/HDL:CHD Risk Coronary Heart Disease Risk Table                     Men   Women  1/2 Average Risk   3.4   3.3  Average Risk       5.0   4.4  2 X Average Risk   9.6   7.1  3 X Average Risk  23.4   11.0        Use the calculated Patient Ratio above and the CHD Risk Table to determine the patient's CHD Risk.        ATP III CLASSIFICATION (LDL):  <100     mg/dL   Optimal  100-129  mg/dL    Near or Above                    Optimal  130-159  mg/dL   Borderline  160-189  mg/dL   High  >190     mg/dL   Very High Performed at [facility], [address] [place], [place] [address]           Passed - HDL in normal range and within 360 days    HDL  Date Value Ref Range Status  02/23/2020 42 >40 mg/dL Final          Passed - Valid encounter within last 12 months    Recent Outpatient Visits           3 weeks ago Schizoaffective disorder, depressive type [facility])   [place], DO   9 months ago Adenocarcinoma of sigmoid colon [facility])   [facility] [name], DO   10 months ago Essential hypertension   [facility] [name], DO   1 year ago Essential hypertension   [place], DO   1 year ago Charcot's joint of left foot   [place], Devonne Doughty, [place]

## 2020-05-11 NOTE — Plan of Care (Signed)
  Problem: Acute Rehab PT Goals(only PT should resolve) Goal: Patient Will Transfer Sit To/From Stand Outcome: Progressing Flowsheets (Taken 05/11/2020 [id number]) Patient will transfer sit to/from stand: with modified independence Goal: Pt Will Transfer Bed To Chair/Chair To Bed Outcome: Progressing Flowsheets (Taken 05/11/2020 0923) Pt will Transfer Bed to Chair/Chair to Bed: with modified independence Goal: Pt Will Ambulate Outcome: Progressing Flowsheets (Taken 05/11/2020 0923) Pt will Ambulate:  > 125 feet  with modified independence  with least restrictive assistive device Goal: Pt/caregiver will Perform Home Exercise Program Outcome: Progressing Flowsheets (Taken 05/11/2020 0923) Pt/caregiver will Perform Home Exercise Program:  For increased strengthening  For improved balance  Independently   9:24 AM, 05/11/20 Mearl Latin PT, DPT Physical Therapist at [facility]

## 2020-05-11 NOTE — Discharge Summary (Signed)
Physician Discharge Summary  [name] DQQ:[id number] DOB: 02-18-1966 DOA: 05/08/2020  PCP: [name], DO  Admit date: 05/08/2020 Discharge date: 05/11/2020  Admitted From:  Home  Disposition:  Home with [facility]   Recommendations for Outpatient Follow-up:  1. Follow up with PCP in 1 weeks 2. Please obtain BMP/CBC in one week   Home Health:  PT, rolling walker   Discharge Condition: STABLE   CODE STATUS: FULL    Brief Hospitalization Summary: Please see all hospital notes, images, labs for full details of the hospitalization. HPI: [name] is a 55 y.o. female with medical history significant of uncontrolled DM2, diabetic neuropathy, HTN, schizophrenia, known persistent microalbuminuria.  Pt reports diarrhea and polydipsia (not really polyuria) for past 2 weeks or so.  BGL has been out of control despite taking meds as instructed.  No CP, no SOB, no cough.  Pt does reports slurred speech: onset yesterday, persistent, nothing makes better or worse.  No associated unilateral weakness.  ED Course: BGL 331 in ED, 243 by time she goes to floor.  concerning however: BUN 53 and Creat 3.0 up significantly from Nov Creat of 1.7 and Oct of 1.3.  Hospital Course  1. Acute renal failure - IMPROVED.  Creatinine down to 1.09 today. Likely prerenal in setting of dehydration and hyperglycemia - treating supportively with IV fluids.  DC lasix.  2. Uncontrolled type 2 diabetes mellitus with hyperglycemia - resume home treatment regimen and close follow up with PCP.  We have been able to get BS much better controlled.    3. Schizophrenia - stable, resumed home meds.  4. Essential hypertension - BPs well controlled.  Following.  5. H/O colon cancer s/p partial colectomy - CT abdomen with no findings of recurrent cancer.   DVT prophylaxis:  Heparin  Code Status: full  Disposition: Home with home health  Discharge Diagnoses:  Principal Problem:   AKI (acute  kidney injury) ([place]) Active Problems:   DM (diabetes mellitus), type 2, 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[place])   Essential hypertension   Persistent microalbuminuria associated with type 2 diabetes mellitus ([place])   Schizophrenia ([place])   Slurred speech  Discharge Instructions:  Allergies as of 05/11/2020   No Known Allergies     Medication List    STOP taking these medications   furosemide 40 MG tablet Commonly known as: Lasix     TAKE these medications   acetaminophen 325 MG tablet Commonly known as: TYLENOL Take 2 tablets (650 mg total) by mouth every 6 (six) hours as needed for mild pain.   ARIPiprazole 15 MG tablet Commonly known as: ABILIFY Take 1 tablet (15 mg total) by mouth at bedtime.   atorvastatin 40 MG tablet Commonly known as: LIPITOR Take 1 tablet (40 mg total) by mouth daily.   buPROPion 300 MG 24 hr tablet Commonly known as: WELLBUTRIN XL Take 300 mg by mouth every morning.   gabapentin 300 MG capsule Commonly known as: NEURONTIN Take 1 capsule (300 mg total) by mouth at bedtime.   hydrochlorothiazide 12.5 MG tablet Commonly known as: HYDRODIURIL Take 1 tablet (12.5 mg total) by mouth daily.   Lantus SoloStar 100 UNIT/ML Solostar Pen Generic drug: insulin glargine Inject 25 Units into the skin at bedtime.   metFORMIN 1000 MG tablet Commonly known as: GLUCOPHAGE Take 1 tablet (1,000 mg total) by mouth daily with breakfast.   metoprolol succinate 50 MG 24 hr tablet Commonly known as: TOPROL-XL Take 1 tablet (50 mg total) by  mouth daily. Take with or immediately following a meal.   ondansetron 4 MG tablet Commonly known as: ZOFRAN Take 1 tablet (4 mg total) by mouth every 6 (six) hours.            Durable Medical Equipment  (From admission, onward)         Start     Ordered   05/11/20 0946  For home use only DME Walker rolling  Once       Question Answer Comment  Walker: With [place] Wheels   Patient needs a walker to treat  with the following condition Gait instability      05/11/20 0945          Follow-up Information    [name], DO. Schedule an appointment as soon as possible for a visit in 1 week(s).   Specialty: Family Medicine Contact information: [place] [address] [phone]              No Known Allergies Allergies as of 05/11/2020   No Known Allergies     Medication List    STOP taking these medications   furosemide 40 MG tablet Commonly known as: Lasix     TAKE these medications   acetaminophen 325 MG tablet Commonly known as: TYLENOL Take 2 tablets (650 mg total) by mouth every 6 (six) hours as needed for mild pain.   ARIPiprazole 15 MG tablet Commonly known as: ABILIFY Take 1 tablet (15 mg total) by mouth at bedtime.   atorvastatin 40 MG tablet Commonly known as: LIPITOR Take 1 tablet (40 mg total) by mouth daily.   buPROPion 300 MG 24 hr tablet Commonly known as: WELLBUTRIN XL Take 300 mg by mouth every morning.   gabapentin 300 MG capsule Commonly known as: NEURONTIN Take 1 capsule (300 mg total) by mouth at bedtime.   hydrochlorothiazide 12.5 MG tablet Commonly known as: HYDRODIURIL Take 1 tablet (12.5 mg total) by mouth daily.   Lantus SoloStar 100 UNIT/ML Solostar Pen Generic drug: insulin glargine Inject 25 Units into the skin at bedtime.   metFORMIN 1000 MG tablet Commonly known as: GLUCOPHAGE Take 1 tablet (1,000 mg total) by mouth daily with breakfast.   metoprolol succinate 50 MG 24 hr tablet Commonly known as: TOPROL-XL Take 1 tablet (50 mg total) by mouth daily. Take with or immediately following a meal.   ondansetron 4 MG tablet Commonly known as: ZOFRAN Take 1 tablet (4 mg total) by mouth every 6 (six) hours.            Durable Medical Equipment  (From admission, onward)         Start     Ordered   05/11/20 0946  For home use only DME Walker rolling  Once       Question Answer Comment  Walker:  With 5 Inch Wheels   Patient needs a walker to treat with the following condition Gait instability      05/11/20 0945          Procedures/Studies: CT ABDOMEN PELVIS WO CONTRAST  Result Date: 05/09/2020 CLINICAL DATA:  Diffuse abdominal pain. Personal history of colon carcinoma. Acute kidney injury. EXAM: CT ABDOMEN AND PELVIS WITHOUT CONTRAST TECHNIQUE: Multidetector CT imaging of the abdomen and pelvis was performed following the standard protocol without IV contrast. COMPARISON:  07/17/2019 from [facility] FINDINGS: Lower chest: No acute findings. Hepatobiliary: No mass visualized on this unenhanced exam. Prior cholecystectomy. No evidence of biliary  obstruction. Pancreas: No mass or inflammatory process visualized on this unenhanced exam. Spleen:  Within normal limits in size. Adrenals/Urinary tract: No evidence of urolithiasis or hydronephrosis. Unremarkable unopacified urinary bladder. Stomach/Bowel: Surgical anastomosis again seen in the sigmoid colon. No mass identified. No evidence of obstruction, inflammatory process, or abnormal fluid collections. Vascular/Lymphatic: No pathologically enlarged lymph nodes identified. No evidence of abdominal aortic aneurysm. Aortic atherosclerotic calcification noted. Reproductive: Normal appearance of uterus. A 4.0 x 3.3 cm right ovarian mass containing macroscopic fat is seen, consistent with a benign ovarian dermoid. This remains stable since prior exam. No other masses identified. No evidence of inflammatory process or abnormal fluid collections. Other:  None. Musculoskeletal:  No suspicious bone lesions identified. IMPRESSION: No acute findings. No evidence of recurrent or metastatic carcinoma. 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 If not surgically resected, recommend annual imaging follow-up. Aortic Atherosclerosis (ICD10-I70.0). Electronically Signed   By: Marlaine Hind M.D.   On: 05/09/2020 11:18   CT HEAD WO CONTRAST  Result Date:  05/09/2020 CLINICAL DATA:  Neuro deficit with stroke suspected. Slurred speech for approximately 24 hours. EXAM: CT HEAD WITHOUT CONTRAST TECHNIQUE: Contiguous axial images were obtained from the base of the skull through the vertex without intravenous contrast. COMPARISON:  None. FINDINGS: Brain: Remote appearing infarct with low density and volume loss in the left parietal region. No acute infarct, hemorrhage, hydrocephalus, or collection. Vascular: No hyperdense vessel or unexpected calcification. Skull: Normal. Negative for fracture or focal lesion. Sinuses/Orbits: No acute finding. IMPRESSION: 1. No acute finding. 2. Small remote left parietal infarct. Electronically Signed   By: Monte Fantasia M.D.   On: 05/09/2020 10:03      Subjective: Pt reports feeling much better today.  No specific complaints.   Discharge Exam: Vitals:   05/10/20 2050 05/11/20 0453  BP: (!) 159/86 (!) 156/74  Pulse: 80 76  Resp: 18 17  Temp: 98.4 F (36.9 C) 98.4 F (36.9 C)  SpO2: 98% 99%   Vitals:   05/10/20 0549 05/10/20 1424 05/10/20 2050 05/11/20 0453  BP: (!) 141/66 128/67 (!) 159/86 (!) 156/74  Pulse: 82 73 80 76  Resp: 20 18 18 17   Temp: 98.4 F (36.9 C) (!) 97.1 F (36.2 C) 98.4 F (36.9 C) 98.4 F (36.9 C)  TempSrc:      SpO2: 98% 100% 98% 99%  Weight:      Height:       General: Pt is alert, awake, not in acute distress Cardiovascular: RRR, S1/S2 +, no rubs, no gallops Respiratory: CTA bilaterally, no wheezing, no rhonchi Abdominal: Soft, NT, ND, bowel sounds + Extremities: no edema, no cyanosis   The results of significant diagnostics from this hospitalization (including imaging, microbiology, ancillary and laboratory) are listed below for reference.     Microbiology: Recent Results (from the past 240 hour(s))  SARS CORONAVIRUS 2 (TAT 6-24 HRS) Nasopharyngeal Nasopharyngeal Swab     Status: None   Collection Time: 05/08/20 11:25 PM   Specimen: Nasopharyngeal Swab  Result Value  Ref Range Status   SARS Coronavirus 2 NEGATIVE NEGATIVE Final    Comment: (NOTE) SARS-CoV-2 target nucleic acids are NOT DETECTED.  The SARS-CoV-2 RNA is generally detectable in upper and lower respiratory specimens during the acute phase of infection. Negative results do not preclude SARS-CoV-2 infection, do not rule out co-infections with other pathogens, and should not be used as the sole basis for treatment or other patient management decisions. Negative results must be combined with clinical observations,  patient history, and epidemiological information. The expected result is Negative.  Fact Sheet for Patients: SugarRoll.be  Fact Sheet for Healthcare Providers: https://www.woods-mathews.com/  This test is not yet approved or cleared by the [place] FDA and  has been authorized for detection and/or diagnosis of SARS-CoV-2 by FDA under an Emergency Use Authorization (EUA). This EUA will remain  in effect (meaning this test can be used) for the duration of the COVID-19 declaration under Se ction 564(b)(1) of the Act, 21 U.S.C. section 360bbb-3(b)(1), unless the authorization is terminated or revoked sooner.  Performed at [facility], [place] [address], [place], [place] [address]      Labs: BNP (last 3 results) No results for input(s): BNP in the last 8760 hours. Basic Metabolic Panel: Recent Labs  Lab 05/08/20 2054 05/09/20 0500 05/10/20 0813 05/11/20 0630  NA 134* 134* 138 137  K 4.4 4.3 4.5 4.6  CL 104 107 109 108  CO2 20* 21* 22 21*  GLUCOSE 284* 230* 249* 193*  BUN 53* 43* 28* 21*  CREATININE 3.05* 1.95* 1.34* 1.09*  CALCIUM 8.9 8.5* 9.1 9.0  PHOS  --   --  2.5 2.3*   Liver Function Tests: Recent Labs  Lab 05/10/20 0813 05/11/20 0630  ALBUMIN 3.3* 3.3*   No results for input(s): LIPASE, AMYLASE in the last 168 hours. No results for input(s): AMMONIA in the last 168 hours. CBC: Recent Labs  Lab  05/08/20 2054 05/09/20 0500  WBC 10.3 8.3  HGB 10.3* 9.2*  HCT 32.3* 28.7*  MCV 93.1 93.8  PLT 228 200   Cardiac Enzymes: No results for input(s): CKTOTAL, CKMB, CKMBINDEX, TROPONINI in the last 168 hours. BNP: Invalid input(s): POCBNP CBG: Recent Labs  Lab 05/10/20 0712 05/10/20 1113 05/10/20 1609 05/10/20 2057 05/11/20 0725  GLUCAP 207* 264* 163* 165* 161*   D-Dimer No results for input(s): DDIMER in the last 72 hours. Hgb A1c Recent Labs    05/08/20 2323  HGBA1C 10.1*   Lipid Profile No results for input(s): CHOL, HDL, LDLCALC, TRIG, CHOLHDL, LDLDIRECT in the last 72 hours. Thyroid function studies No results for input(s): TSH, T4TOTAL, T3FREE, THYROIDAB in the last 72 hours.  Invalid input(s): FREET3 Anemia work up No results for input(s): VITAMINB12, FOLATE, FERRITIN, TIBC, IRON, RETICCTPCT in the last 72 hours. Urinalysis    Component Value Date/Time   COLORURINE YELLOW 03/03/2020 2319   APPEARANCEUR HAZY (A) 03/03/2020 2319   APPEARANCEUR Clear 08/19/2012 1204   LABSPEC 1.016 03/03/2020 2319   LABSPEC 1.015 08/19/2012 1204   PHURINE 5.0 03/03/2020 2319   GLUCOSEU 50 (A) 03/03/2020 2319   GLUCOSEU >=500 08/19/2012 1204   HGBUR SMALL (A) 03/03/2020 2319   BILIRUBINUR NEGATIVE 03/03/2020 2319   BILIRUBINUR Negative 12/13/2017 1533   BILIRUBINUR Negative 08/19/2012 1204   KETONESUR NEGATIVE 03/03/2020 2319   PROTEINUR 30 (A) 03/03/2020 2319   UROBILINOGEN 0.2 12/13/2017 1533   NITRITE NEGATIVE 03/03/2020 2319   LEUKOCYTESUR MODERATE (A) 03/03/2020 2319   LEUKOCYTESUR Trace 08/19/2012 1204   Sepsis Labs Invalid input(s): PROCALCITONIN,  WBC,  LACTICIDVEN Microbiology Recent Results (from the past 240 hour(s))  SARS CORONAVIRUS 2 (TAT 6-24 HRS) Nasopharyngeal Nasopharyngeal Swab     Status: None   Collection Time: 05/08/20 11:25 PM   Specimen: Nasopharyngeal Swab  Result Value Ref Range Status   SARS Coronavirus 2 NEGATIVE NEGATIVE Final     Comment: (NOTE) SARS-CoV-2 target nucleic acids are NOT DETECTED.  The SARS-CoV-2 RNA is generally detectable in upper  and lower respiratory specimens during the acute phase of infection. Negative results do not preclude SARS-CoV-2 infection, do not rule out co-infections with other pathogens, and should not be used as the sole basis for treatment or other patient management decisions. Negative results must be combined with clinical observations, patient history, and epidemiological information. The expected result is Negative.  Fact Sheet for Patients: SugarRoll.be  Fact Sheet for Healthcare Providers: https://www.woods-mathews.com/  This test is not yet approved or cleared by the [place] FDA and  has been authorized for detection and/or diagnosis of SARS-CoV-2 by FDA under an Emergency Use Authorization (EUA). This EUA will remain  in effect (meaning this test can be used) for the duration of the COVID-19 declaration under Se ction 564(b)(1) of the Act, 21 U.S.C. section 360bbb-3(b)(1), unless the authorization is terminated or revoked sooner.  Performed at [facility], [place] [address] [place], [place] [address]    Time coordinating discharge: 40 mins   SIGNED:  Irwin Brakeman, MD  [facility] 05/11/2020, 10:39 AM How to contact the [facility] Attending or Consulting provider [place] or covering provider during after hours [place], for this patient?  1. Check the care team in [facility] and look for a) attending/consulting [facility] provider listed and b) the [facility] team listed 2. Log into [email or web address] and use [place]'s universal password to access. If you do not have the password, please contact the hospital operator. 3. Locate the [facility] provider you are looking for under [facility] and page to a number that you can be directly reached. 4. If you still have difficulty reaching the provider, please page the [facility] (Director on  Call) for the Hospitalists listed on amion for assistance.

## 2020-05-11 NOTE — Discharge Instructions (Signed)
Acute Kidney Injury, Adult  Acute kidney injury is a sudden worsening of kidney function. The kidneys are organs that have several jobs. They filter the blood to remove waste products and extra fluid. They also maintain a healthy balance of minerals and hormones in the body, which helps control blood pressure and keep bones strong. With this condition, your kidneys do not do their jobs as well as they should. This condition ranges from mild to severe. Over time, it may develop into long-lasting (chronic) kidney disease. Early detection and treatment may prevent acute kidney injury from developing into a chronic condition. What are the causes? Common causes of this condition include:  A problem with blood flow to the kidneys. This may be caused by: ? Low blood pressure (hypotension) or shock. ? Blood loss. ? Heart and blood vessel (cardiovascular) disease. ? Severe burns. ? Liver disease.  Direct damage to the kidneys. This may be caused by: ? Certain medicines. ? A kidney infection. ? Poisoning. ? Being around or in contact with toxic substances. ? A surgical wound. ? A hard, direct hit to the kidney area.  A sudden blockage of urine flow. This may be caused by: ? Cancer. ? Kidney stones. ? An enlarged prostate in males. What increases the risk? You are more likely to develop this condition if you:  Are older than age 65.  Are female.  Are hospitalized, especially if you are in critical condition.  Have certain conditions, such as: ? Chronic kidney disease. ? Diabetes. ? Coronary artery disease and heart failure. ? Pulmonary disease. ? Chronic liver disease. What are the signs or symptoms? Symptoms of this condition may not be obvious until the condition becomes severe. Symptoms of this condition can include:  Tiredness (lethargy) or difficulty staying awake.  Nausea or vomiting.  Swelling (edema) of the face, legs, ankles, or feet.  Problems with urination, such  as: ? Pain in the abdomen, or pain along the side of your stomach (flank). ? Producing little or no urine. ? Passing urine with a weak flow.  Muscle twitches and cramps, especially in the legs.  Confusion or trouble concentrating.  Loss of appetite.  Fever. How is this diagnosed? Your health care provider can diagnose this condition based on your symptoms, medical history, and a physical exam.  You may also have other tests, such as:  Blood tests.  Urine tests.  Imaging tests.  A test in which a sample of tissue is removed from the kidneys to be examined under a microscope (kidney biopsy). How is this treated? Treatment for this condition depends on the cause and how severe the condition is. In mild cases, treatment may not be needed. The kidneys may heal on their own. In more severe cases, treatment will involve:  Treating the cause of the kidney injury. This may involve changing any medicines you are taking or adjusting your dosage.  Fluids. You may need specialized IV fluids to balance your body's needs.  Having a catheter placed to drain urine and prevent blockages.  Preventing problems from occurring. This may mean avoiding certain medicines or procedures that can cause further injury to the kidneys. In some cases, treatment may also require:  A procedure to remove toxic wastes from the body (dialysis or continuous renal replacement therapy, CRRT).  Surgery. This may be done to repair a torn kidney or to remove the blockage from the urinary system. Follow these instructions at home: Medicines  Take over-the-counter and prescription medicines only as   told by your health care provider.  Do not take any new medicines without your health care provider's approval. Many medicines can worsen your kidney damage.  Do not take any vitamin and mineral supplements without your health care provider's approval. Many nutritional supplements can worsen your kidney  damage. Lifestyle  If your health care provider prescribed changes to your diet, follow them. You may need to decrease the amount of protein you eat.  Achieve and maintain a healthy weight. If you need help with this, ask your health care provider.  Start or continue an exercise plan. Try to exercise at least 30 minutes a day, 5 days a week.  Do not use any products that contain nicotine or tobacco, such as cigarettes, e-cigarettes, and chewing tobacco. If you need help quitting, ask your health care provider.   General instructions  Keep track of your blood pressure. Report changes in your blood pressure as told by your health care provider.  Stay up to date with your vaccines. Ask your health care provider which vaccines you need.  Keep all follow-up visits as told by your health care provider. This is important.   Where to find more information  American Association of Kidney Patients: BombTimer.gl  National Kidney Foundation: www.kidney.[place]: https://mathis.com/  Life Options Rehabilitation Program: ? www.lifeoptions.org ? www.kidneyschool.org Contact a health care provider if:  Your symptoms get worse.  You develop new symptoms. Get help right away if:  You develop symptoms of worsening kidney disease, which include: ? Headaches. ? Abnormally dark or light skin. ? Easy bruising. ? Frequent hiccups. ? Chest pain. ? Shortness of breath. ? End of menstruation in women. ? Seizures. ? Confusion or altered mental status. ? Abdominal or back pain. ? Itchiness.  You have a fever.  Your body is producing less urine.  You have pain or bleeding when you urinate. Summary  Acute kidney injury is a sudden worsening of kidney function.  Acute kidney injury can be caused by problems with blood flow to the kidneys, direct damage to the kidneys, and sudden blockage of urine flow.  Symptoms of this condition may not be obvious until it becomes severe.  Symptoms may include edema, lethargy, confusion, nausea or vomiting, and problems passing urine.  This condition can be diagnosed with blood tests, urine tests, and imaging tests. Sometimes a kidney biopsy is done to diagnose this condition.  Treatment for this condition often involves treating the underlying cause. It is treated with fluids, medicines, diet changes, dialysis, or surgery. This information is not intended to replace advice given to you by your health care provider. Make sure you discuss any questions you have with your health care provider. Document Revised: 02/26/2019 Document Reviewed: 02/26/2019 Elsevier Patient Education  2021 [place].    IMPORTANT INFORMATION: PAY CLOSE ATTENTION   PHYSICIAN DISCHARGE INSTRUCTIONS  Follow with Primary care provider  [name], DO  and other consultants as instructed by your Hospitalist Physician  [place] IF SYMPTOMS COME BACK, WORSEN OR NEW PROBLEM DEVELOPS   Please note: You were cared for by a hospitalist during your hospital stay. Every effort will be made to forward records to your primary care provider.  You can request that your primary care provider send for your hospital records if they have not received them.  Once you are discharged, your primary care physician will handle any further medical issues. Please note that NO REFILLS for any discharge  medications will be authorized once you are discharged, as it is imperative that you return to your primary care physician (or establish a relationship with a primary care physician if you do not have one) for your post hospital discharge needs so that they can reassess your need for medications and monitor your lab values.  Please get a complete blood count and chemistry panel checked by your Primary MD at your next visit, and again as instructed by your Primary MD.  Get Medicines reviewed and adjusted: Please take all your  medications with you for your next visit with your Primary MD  Laboratory/radiological data: Please request your Primary MD to go over all hospital tests and procedure/radiological results at the follow up, please ask your primary care provider to get all Hospital records sent to his/her office.  In some cases, they will be blood work, cultures and biopsy results pending at the time of your discharge. Please request that your primary care provider follow up on these results.  If you are diabetic, please bring your blood sugar readings with you to your follow up appointment with primary care.    Please call and make your follow up appointments as soon as possible.    Also Note the following: If you experience worsening of your admission symptoms, develop shortness of breath, life threatening emergency, suicidal or homicidal thoughts you must seek medical attention immediately by calling 911 or calling your MD immediately  if symptoms less severe.  You must read complete instructions/literature along with all the possible adverse reactions/side effects for all the Medicines you take and that have been prescribed to you. Take any new Medicines after you have completely understood and accpet all the possible adverse reactions/side effects.   Do not drive when taking Pain medications or sleeping medications (Benzodiazepines)  Do not take more than prescribed Pain, Sleep and Anxiety Medications. It is not advisable to combine anxiety,sleep and pain medications without talking with your primary care practitioner  Special Instructions: If you have smoked or chewed Tobacco  in the last 2 yrs please stop smoking, stop any regular Alcohol  and or any Recreational drug use.  Wear Seat belts while driving.  Do not drive if taking any narcotic, mind altering or controlled substances or recreational drugs or alcohol.

## 2020-05-11 NOTE — TOC Transition Note (Signed)
Transition of Care [facility]) - CM/SW Discharge Note   Patient Details  Name: [name] MRN: [id number] Date of Birth: 1966/02/12  [facility] CM/SW Contact:  Salome Arnt, LCSW Phone Number: 05/11/2020, 1:27 PM   Clinical Narrative:  Pt d/c today. Rolling walker order sent to [facility] and delivered to room. [place] accepted HHPT referral. Pt aware they will contact her to schedule visits. Pt reports she does not have a ride home. LCSW called [employer] and arranged transport this afternoon. Driver will call nurse's station when they arrive.     Final next level of care: [place] Barriers to Discharge: Barriers Resolved   Patient Goals and CMS Choice Patient states their goals for this hospitalization and ongoing recovery are:: Return home with [facility] CMS Medicare.gov Compare Post Acute Care list provided to:: Patient Choice offered to / list presented to : Patient  Discharge Placement                       Discharge Plan and Services   Discharge Planning Services: NA            DME Arranged: [place]ford Rile rolling DME Agency: [employer] Date DME Agency Contacted: 05/11/20 Time DME Agency Contacted: [phone] Representative spoke with at DME Agency: [place]: PT [place]: [place] ([place]) Date [place]: 05/11/20 Time [place]: 1326 Representative spoke with at [place]: [place] ([place]) Interventions     Readmission Risk Interventions Readmission Risk Prevention Plan 05/10/2020 05/24/2019 10/21/2018  Post Dischage Appt - - Complete  Medication Screening - - Complete  Transportation Screening Complete Complete Complete  PCP or Specialist Appt within 5-7 Days - - -  Home Care Screening - - -  Medication Review (RN CM) - - -  Medication Review ([place]) Complete Complete -  PCP or Specialist appointment within 3-5 days  of discharge - Complete -  [place] or Home Care Consult Complete (No Data) -  SW Recovery Care/Counseling Consult Complete - -  Palliative Care Screening Not Applicable - -  [place] - Not Applicable -  Some recent data might be hidden

## 2020-05-11 NOTE — Evaluation (Signed)
Physical Therapy Evaluation Patient Details Name: [name] MRN: [id number] DOB: 1965/11/09 Today's Date: 05/11/2020   History of Present Illness  [name] is a 55 y.o. female with medical history significant of uncontrolled DM2, diabetic neuropathy, HTN, schizophrenia, known persistent microalbuminuria.     Pt reports diarrhea and polydipsia (not really polyuria) for past 2 weeks or so.  BGL has been out of control despite taking meds as instructed.     No CP, no SOB, no cough.     Pt does reports slurred speech: onset yesterday, persistent, nothing makes better or worse.  No associated unilateral weakness.    Clinical Impression  Patient limited for functional mobility as stated below secondary to BLE weakness, fatigue and impaired standing balance. Patient does not require assist for bed mobility but does take increased time to complete with HOB elevated. Patient able to don required L ankle boot for weightbearing. Patient transfers to standing with use of RW and is minimally unsteady upon standing. Patient ambulates with RW with decreased cadence but without loss of balance. Patient returned to bed at end of session. Patient would benefit from RW to reduce the risk of falls and is educated on using it properly upon return home. Patient will benefit from continued physical therapy in hospital and recommended venue below to increase strength, balance, endurance for safe ADLs and gait.     Follow Up Recommendations Home health PT    Equipment Recommendations  Rolling walker with 5" wheels    Recommendations for Other Services       Precautions / Restrictions Precautions Precautions: Fall Restrictions Weight Bearing Restrictions: No      Mobility  Bed Mobility Overal bed mobility: Modified Independent                  Transfers Overall transfer level: Needs assistance Equipment used: Rolling walker (2 wheeled) Transfers: Sit to/from Stand;Stand Pivot  Transfers Sit to Stand: Supervision Stand pivot transfers: Supervision       General transfer comment: minimally unsteady upon standing, uses RW  Ambulation/Gait Ambulation/Gait assistance: Min guard Gait Distance (Feet): 75 Feet Assistive device: Rolling walker (2 wheeled) Gait Pattern/deviations: Decreased stride length;Trunk flexed     General Gait Details: ambulates with RW with decreased cadence  Stairs            Wheelchair Mobility    Modified Rankin (Stroke Patients Only)       Balance Overall balance assessment: Needs assistance Sitting-balance support: Feet supported Sitting balance-Leahy Scale: Normal Sitting balance - Comments: seated EOB   Standing balance support: Bilateral upper extremity supported Standing balance-Leahy Scale: Good Standing balance comment: good/fair with RW                             Pertinent Vitals/Pain Pain Assessment: No/denies pain    Home Living Family/patient expects to be discharged to:: Private residence Living Arrangements: Alone Available Help at Discharge: Family;Personal care attendant;Available PRN/intermittently Type of Home: Apartment Home Access: Elevator     Home Layout: One level Home Equipment: Walker - 4 wheels;Shower seat;Cane - single point;Wheelchair - manual      Prior Function Level of Independence: Needs assistance   Gait / Transfers Assistance Needed: household ambulator with rollator or wheelchair for mobility  ADL's / Homemaking Assistance Needed: aid assists 3x/week for 2 hours  Comments: primarily uses WC for mobility, but able to walk with RW     Hand  Dominance        Extremity/Trunk Assessment   Upper Extremity Assessment Upper Extremity Assessment: Overall WFL for tasks assessed    Lower Extremity Assessment Lower Extremity Assessment: Overall WFL for tasks assessed    Cervical / Trunk Assessment Cervical / Trunk Assessment: Normal  Communication    Communication: No difficulties  Cognition Arousal/Alertness: Awake/alert Behavior During Therapy: WFL for tasks assessed/performed Overall Cognitive Status: Within Functional Limits for tasks assessed                                        General Comments      Exercises     Assessment/Plan    PT Assessment Patient needs continued PT services  PT Problem List Decreased strength;Decreased activity tolerance;Decreased mobility;Decreased knowledge of use of DME;Decreased balance       PT Treatment Interventions DME instruction;Balance training;Gait training;Neuromuscular re-education;Stair training;Functional mobility training;Patient/family education;Therapeutic activities;Therapeutic exercise;Manual techniques    PT Goals (Current goals can be found in the Care Plan section)  Acute Rehab PT Goals Patient Stated Goal: Return home PT Goal Formulation: With patient Time For Goal Achievement: 05/25/20 Potential to Achieve Goals: Good    Frequency Min 3X/week   Barriers to discharge        Co-evaluation               AM-PAC PT "6 Clicks" Mobility  Outcome Measure Help needed turning from your back to your side while in a flat bed without using bedrails?: None Help needed moving from lying on your back to sitting on the side of a flat bed without using bedrails?: None Help needed moving to and from a bed to a chair (including a wheelchair)?: A Little Help needed standing up from a chair using your arms (e.g., wheelchair or bedside chair)?: A Little Help needed to walk in hospital room?: A Little Help needed climbing 3-5 steps with a railing? : A Little 6 Click Score: 20    End of Session Equipment Utilized During Treatment: Gait belt Activity Tolerance: Patient tolerated treatment well Patient left: in bed;with call bell/phone within reach Nurse Communication: Mobility status PT Visit Diagnosis: Unsteadiness on feet (R26.81);Other abnormalities of  gait and mobility (R26.89);Muscle weakness (generalized) (M62.81)    Time: 3704-8889 PT Time Calculation (min) (ACUTE ONLY): 16 min   Charges:   PT Evaluation $PT Eval Low Complexity: 1 Low         9:22 AM, 05/11/20 Mearl Latin PT, DPT Physical Therapist at [facility]

## 2020-05-12 ENCOUNTER — Telehealth: Payer: Self-pay

## 2020-05-12 NOTE — Telephone Encounter (Signed)
Transition Care Management Follow-up Telephone Call  Date of discharge and from where: 05/12/2019 [facility]  How have you been since you were released from the hospital? Doing good, no pain  Any questions or concerns? No  Items Reviewed:  Did the pt receive and understand the discharge instructions provided? Yes   Medications obtained and verified? Yes   Other? No   Any new allergies since your discharge? No   Dietary orders reviewed? Yes  Do you have support at home? Yes   Home Care and Equipment/Supplies: Were home health services ordered? yes If so, what is the name of the agency? [facility]  Has the agency set up a time to come to the patient's home? yes Were any new equipment or medical supplies ordered?  Yes: rolling walker What is the name of the medical supply agency? [facility] ? Were you able to get the supplies/equipment? yes Do you have any questions related to the use of the equipment or supplies? No  Functional Questionnaire: (I = Independent and D = Dependent) ADLs: I  Bathing/Dressing- I  Meal Prep- I  Eating- I  Maintaining continence- I  Transferring/Ambulation- I  Managing Meds- I  Follow up appointments reviewed:   PCP Hospital f/u appt confirmed? Yes  Scheduled to see Dr. Parks Ranger on 05/19/2020 @ 2:00.  Are transportation arrangements needed? No   If their condition worsens, is the pt aware to call PCP or go to the Emergency Dept.? Yes  Was the patient provided with contact information for the PCP's office or ED? Yes  Was to pt encouraged to call back with questions or concerns? Yes

## 2020-05-13 ENCOUNTER — Telehealth: Payer: Self-pay

## 2020-05-14 ENCOUNTER — Ambulatory Visit: Payer: Self-pay | Admitting: Licensed Clinical Social Worker
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
## 2020-05-15 ENCOUNTER — Telehealth: Payer: Self-pay | Admitting: General Practice

## 2020-05-15 ENCOUNTER — Other Ambulatory Visit: Payer: Self-pay | Admitting: Family Medicine

## 2020-05-15 ENCOUNTER — Telehealth: Payer: Self-pay | Admitting: Family Medicine

## 2020-05-15 NOTE — Telephone Encounter (Signed)
[place] is calling to request verbal Orders for PT frequency 2 week 2, 1 week 6. Call Back- [phone] Ok to verbal on VM.

## 2020-05-15 NOTE — Telephone Encounter (Signed)
Spoke with [name] and authorized verbal orders for the patient.

## 2020-05-15 NOTE — Telephone Encounter (Signed)
°  Chronic Care Management   Outreach Note  05/15/2020 Name: [name] MRN: [id number] DOB: 1966-01-31  Referred by: [name], DO Reason for referral : Appointment (RNCM: Follow up attempt for [place]- the patient answered the phone and told the [facility] that she could not talk today but ask for a call back )   An unsuccessful telephone outreach was attempted today. The patient was referred to the case management team for assistance with care management and care coordination. The patient answered the phone and said now was not a good time to talk and ask for a call back on Monday.   Follow Up Plan: Telephone follow up appointment with care management team member scheduled for:  05-18-2020  Noreene Larsson RN, MSN, [place] Mobile: [phone]

## 2020-05-15 NOTE — Telephone Encounter (Signed)
Could you call them back to give the OK on the verbal orders?  Thank you  [name], DO [facility] 05/15/2020, 2:16 PM

## 2020-05-18 ENCOUNTER — Ambulatory Visit: Payer: Self-pay | Admitting: General Practice

## 2020-05-18 ENCOUNTER — Telehealth: Payer: Self-pay | Admitting: General Practice

## 2020-05-18 DIAGNOSIS — F251 Schizoaffective disorder, depressive type: Secondary | ICD-10-CM

## 2020-05-18 DIAGNOSIS — IMO0002 Reserved for concepts with insufficient information to code with codable children: Secondary | ICD-10-CM

## 2020-05-18 DIAGNOSIS — E1149 Type 2 diabetes mellitus with other diabetic neurological complication: Secondary | ICD-10-CM

## 2020-05-18 DIAGNOSIS — I10 Essential (primary) hypertension: Secondary | ICD-10-CM

## 2020-05-18 NOTE — Chronic Care Management (AMB) (Signed)
Chronic Care Management   CCM RN Visit Note  05/18/2020 Name: [name] MRN: [id number] DOB: August 14, 1965  Subjective: [name] is a 55 y.o. year old female who is a primary care patient of [name], DO. The care management team was consulted for assistance with disease management and care coordination needs.    Engaged with patient by telephone for follow up visit in response to provider referral for case management and/or care coordination services.   Consent to Services:  Currently engaged with CCM program   Patient agreed to services and verbal consent obtained.   Assessment: Review of patient past medical history, allergies, medications, health status, including review of consultants reports, laboratory and other test data, was performed as part of comprehensive evaluation and provision of chronic care management services.   SDOH (Social Determinants of Health) assessments and interventions performed:    CCM Care Plan  No Known Allergies  Outpatient Encounter Medications as of 05/18/2020  Medication Sig   acetaminophen (TYLENOL) 325 MG tablet Take 2 tablets (650 mg total) by mouth every 6 (six) hours as needed for mild pain.   ARIPiprazole (ABILIFY) 15 MG tablet Take 1 tablet (15 mg total) by mouth at bedtime.   atorvastatin (LIPITOR) 40 MG tablet Take 1 tablet (40 mg total) by mouth daily.   buPROPion (WELLBUTRIN XL) 300 MG 24 hr tablet TAKE 1 TABLET BY MOUTH EACH MORNING.   colestipol (COLESTID) 1 g tablet TAKE (2) TABLETS BY MOUTH TWICE DAILY.   gabapentin (NEURONTIN) 300 MG capsule Take 1 capsule (300 mg total) by mouth at bedtime.   hydrochlorothiazide (HYDRODIURIL) 12.5 MG tablet Take 1 tablet (12.5 mg total) by mouth daily.   insulin glargine (LANTUS SOLOSTAR) 100 UNIT/ML Solostar Pen Inject 25 Units into the skin at bedtime.   metFORMIN (GLUCOPHAGE) 1000 MG tablet Take 1 tablet (1,000 mg total) by mouth daily with breakfast.    metoprolol succinate (TOPROL-XL) 50 MG 24 hr tablet Take 1 tablet (50 mg total) by mouth daily. Take with or immediately following a meal.   ondansetron (ZOFRAN) 4 MG tablet Take 1 tablet (4 mg total) by mouth every 6 (six) hours.   ONETOUCH VERIO test strip CHECK BLOOD SUGAR 3 TIMES DAILY.   No facility-administered encounter medications on file as of 05/18/2020.    Patient Active Problem List   Diagnosis Date Noted   Slurred speech 05/09/2020   Generalized weakness 03/04/2020   Schizophrenia ([place]) 02/20/2020   Chronic diarrhea 11/25/2019   History of colon cancer 11/25/2019   Heart palpitations 08/19/2019   Iron deficiency anemia 07/25/2019   Diabetes mellitus with polyneuropathy ([place]) 07/17/2019   S/P partial colectomy 07/17/2019   AKI (acute kidney injury) ([place]) 07/17/2019   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[place]) 05/28/2019   Anemia    Hyponatremia 05/23/2019   Neuropathic foot ulcer, left, with fat layer exposed ([place]) 02/05/2019   Hyperkalemia 02/05/2019   Persistent microalbuminuria associated with type 2 diabetes mellitus ([place]) 05/16/2018   Type 2 diabetes mellitus with hyperglycemia, with long-term current use of insulin ([place]) 05/16/2018   Morbid obesity ([place]) 05/10/2017   Long-term use of high-risk medication 05/10/2017   Hyperlipidemia associated with type 2 diabetes mellitus ([place]) 06/27/2016   Hammer toe of second toe of right foot 04/20/2016   Essential hypertension 06/29/2015   Schizoaffective disorder ([place]) 06/29/2015   DM (diabetes mellitus), type 2, 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[place]) 16/05/930   Polyneuropathy 04/02/2014    Conditions to be addressed/monitored:HTN, HLD, DMII,  Anxiety, Depression and schizoaffective disorder  Care Plan : RNCM: Diabetes Type 2 (Adult)  Updates made by Vanita Ingles since 05/18/2020 12:00 AM    Problem: RNCM: Glycemic Management (Diabetes, Type 2)   Priority: High    Goal: RNCM: Glycemic  Management Optimized   Priority: High  Note:   Objective:  Lab Results  Component Value Date   HGBA1C 10.1 (H) 05/08/2020    Lab Results  Component Value Date   CREATININE 1.09 (H) 05/11/2020   CREATININE 1.34 (H) 05/10/2020   CREATININE 1.95 (H) 05/09/2020     No results found for: EGFR Current Barriers:   Knowledge Deficits related to basic Diabetes pathophysiology and self care/management  Knowledge Deficits related to medications used for management of diabetes  Does not have glucometer to monitor blood sugar- does not have strips for current glucometer. Asking for a new script for meter and supplies  Financial Constraints  Cognitive Deficits  Does not use cbg meter   Limited Social Support  Unable to independently manage DM2  Does not adhere to provider recommendations re: take medications, keep appointments, adhere to prescribed diet  Does not attend all scheduled provider appointments  Does not adhere to prescribed medication regimen  Lacks social connections  Unable to perform IADLs independently  Does not maintain contact with provider office  Does not contact provider office for questions/concerns Case Manager Clinical Goal(s):   Collaboration with [name], DO regarding development and update of comprehensive plan of care as evidenced by provider attestation and co-signature  Inter-disciplinary care team collaboration (see longitudinal plan of care)  Over the next 120 days, patient will demonstrate improved adherence to prescribed treatment plan for diabetes self care/management as evidenced by:   daily monitoring and recording of CBG   adherence to ADA/ carb modified diet  adherence to prescribed medication regimen Interventions:   Provided education to patient about basic DM disease process  Reviewed medications with patient and discussed importance of medication adherence  Discussed plans with patient for ongoing care  management follow up and provided patient with direct contact information for care management team  Provided patient with written educational materials related to hypo and hyperglycemia and importance of correct treatment  Reviewed scheduled/upcoming provider appointments including: 05-19-2020 but will need to change due to transportation needs and weather issues impacting ability to get to appointment  Advised patient, providing education and rationale, to check cbg TID and record, calling pcp for findings outside established parameters.    Referral made to pharmacy team for assistance with getting new glucose meter and supplies  Referral made to community resources care guide team for assistance with resources in [facility] for Meals on wheels or other food delivery surface   Review of patient status, including review of consultants reports, relevant laboratory and other test results, and medications completed. Patient Goals/Self-Care Activities  Over the next 120 days, patient will:  - UNABLE to independently manage DM effectively  Self administers oral medications as prescribed Self administers insulin as prescribed Attends all scheduled provider appointments Checks blood sugars as prescribed and utilize hyper and hypoglycemia protocol as needed Adheres to prescribed ADA/carb modified - barriers to adherence to treatment plan identified - blood glucose monitoring encouraged - individualized medical nutrition therapy provided - resources required to improve adherence to care identified - self-awareness of signs/symptoms of hypo or hyperglycemia encouraged - use of blood glucose monitoring log promoted Follow Up Plan: Telephone follow up appointment with care management team member  scheduled for: 07-06-2020 at 11:45 am    Task: RNCM: Alleviate Barriers to Glycemic Management   Note:   Care Management Activities:    - barriers to adherence to treatment plan identified - blood  glucose monitoring encouraged - individualized medical nutrition therapy provided - resources required to improve adherence to care identified - self-awareness of signs/symptoms of hypo or hyperglycemia encouraged - use of blood glucose monitoring log promoted       Care Plan : RNCM: Hypertension (Adult)  Updates made by Vanita Ingles since 05/18/2020 12:00 AM    Problem: RNCM: Hypertension (Hypertension)   Priority: Medium    Goal: RNCM: Hypertension Monitored   Priority: Medium  Note:   Objective:   Last practice recorded BP readings:  BP Readings from Last 3 Encounters:  05/11/20 (!) 156/74  04/17/20 134/76  03/04/20 (!) 170/80     Most recent eGFR/CrCl: No results found for: EGFR  No components found for: CRCL Current Barriers:   Knowledge Deficits related to basic understanding of hypertension pathophysiology and self care management  Knowledge Deficits related to understanding of medications prescribed for management of hypertension  Non-adherence to prescribed medication regimen  Non-adherence to scheduled provider appointments  Transportation barriers  Cognitive Deficits  Limited Social Lawyer.   Unable to independently management of HTN   Does not adhere to provider recommendations re: dietary restrictions, medications management and lifestyle changes   Does not attend all scheduled provider appointments  Does not adhere to prescribed medication regimen  Lacks social connections  Unable to perform IADLs independently  Does not maintain contact with provider office  Does not contact provider office for questions/concerns Case Manager Clinical Goal(s):   Over the next 120 days, patient will verbalize understanding of plan for hypertension management  Over the next 120 days, patient will attend all scheduled medical appointments: 05-19-2020 with pcp  Over the next 120 days, patient will demonstrate improved adherence to  prescribed treatment plan for hypertension as evidenced by taking all medications as prescribed, monitoring and recording blood pressure as directed, adhering to low sodium/DASH diet  Over the next 120 days, patient will demonstrate improved health management independence as evidenced by checking blood pressure as directed and notifying PCP if SBP>160 or DBP > 90, taking all medications as prescribe, and adhering to a low sodium diet as discussed.  Over the next 120 days, patient will verbalize basic understanding of hypertension disease process and self health management plan as evidenced by compliance with medications, diet, and working with CCM team to maintain health and well being.  Interventions:   Collaboration with [name], DO regarding development and update of comprehensive plan of care as evidenced by provider attestation and co-signature  Inter-disciplinary care team collaboration (see longitudinal plan of care)  Evaluation of current treatment plan related to hypertension self management and patient's adherence to plan as established by provider.  Provided education to patient re: stroke prevention, s/s of heart attack and stroke, DASH diet, complications of uncontrolled blood pressure  Reviewed medications with patient and discussed importance of compliance  Discussed plans with patient for ongoing care management follow up and provided patient with direct contact information for care management team  Advised patient, providing education and rationale, to monitor blood pressure daily and record, calling PCP for findings outside established parameters.   Reviewed scheduled/upcoming provider appointments including: 05-19-2020 Patient Goals/Self-Care Activities  Over the next 120 days, patient will:  - Self administers medications as prescribed  Attends all scheduled provider appointments Calls provider office for new concerns, questions, or BP outside discussed  parameters Checks BP and records as discussed Follows a low sodium diet/DASH diet - blood pressure trends reviewed - depression screen reviewed - home or ambulatory blood pressure monitoring encouraged Follow Up Plan: Telephone follow up appointment with care management team member scheduled for: 07-06-2020 at 11:45 am   Task: RNCM: Identify and Monitor Blood Pressure Elevation   Note:   Care Management Activities:    - blood pressure trends reviewed - depression screen reviewed - home or ambulatory blood pressure monitoring encouraged       Care Plan : RNCM: Schizoaffective Disorder and Depression (Adult)  Updates made by Vanita Ingles since 05/18/2020 12:00 AM    Problem: RNCM: Schizoaffective Disorder and Depression Identification (Depression)   Priority: Medium    Goal: RNCM: Depressive Symptoms Identified   Priority: Medium  Note:   Current Barriers:   Knowledge Deficits related to effective management of behavioral health conditions   Care Coordination needs related to food resources  in a patient with anxiety, depression and schizoaffective disorder  Chronic Disease Management support and education needs related to effective management of depression /anxiety/ and schizoaffective disorder  Lacks caregiver support.   Film/video editor.   Transportation barriers  Non-adherence to scheduled provider appointments  Non-adherence to prescribed medication regimen  Cognitive Deficits  Unable to self administer medications as prescribed  Does not attend all scheduled provider appointments  Does not adhere to prescribed medication regimen  Does not adhere to prescribed psychotropic medication regimen  Lacks social connections  Unable to perform IADLs independently  Does not maintain contact with provider office  Does not contact provider office for questions/concerns  Nurse Case Manager Clinical Goal(s):   Over the next 120 days, patient will verbalize  understanding of plan for effective management of anxiety, depression, and schizoaffective disorder   Over the next 120 days, patient will work with [facility] and CCM team  to address needs related to behavioral health management and needs  Over the next 120 days, patient will not experience hospital admission. Hospital Admissions in last 6 months = 6  Over the next 120 days, patient will demonstrate a decrease in behavioral health exacerbations as evidenced by compliance to the plan of care   Over the next 120 days, patient will demonstrate improved adherence to prescribed treatment plan for depression, anxiety, and schizoaffective disorder  as evidenced byno new hospitalization, independent in ADL and IADLS, and compliance with plan of care   Over the next 120 days, patient will work with CM team pharmacist to assist with expressed needs for glucometer and medication management   Over the next 120 days, patient will work with care guides  (community agency) to receive recourses for food in [place]  Interventions:   1:1 collaboration with Parks Ranger, Devonne Doughty, DO regarding development and update of comprehensive plan of care as evidenced by provider attestation and co-signature  Inter-disciplinary care team collaboration (see longitudinal plan of care)  Evaluation of current treatment plan related to depression, anxiety and schizoaffective disorder  and patient's adherence to plan as established by provider.  Advised patient to call the office and reschedule appointment for 1-18 and to call transportation services and arrange transportation for new appointment   Provided education to patient re: taking medications and maintaining current regimen. Reminded patient of appointment with psychiatrist. The patient verbalized it was in March   Collaborated with pcp and CCM team  regarding appointment changes and expressed needs   Discussed plans with patient for ongoing care management  follow up and provided patient with direct contact information for care management team  Care Guide referral for food resources   Patient Goals/Self-Care Activities Over the next 120 days, patient will:  - Patient will self administer medications as prescribed Patient will attend all scheduled provider appointments Patient will call pharmacy for medication refills Patient will continue to perform IADL's independently Patient will call provider office for new concerns or questions Patient will work with BSW to address care coordination needs and will continue to work with the clinical team to address health care and disease management related needs.   - anxiety screen reviewed - depression screen reviewed - participation in psychiatric services encouraged  Follow Up Plan: Telephone follow up appointment with care management team member scheduled for: 07-06-2020 at 11:45 am       Task: RNCM: Identify Depressive Symptoms and Facilitate Treatment   Note:   Care Management Activities:    - anxiety screen reviewed - depression screen reviewed - participation in psychiatric services encouraged       Care Plan : RNCM: HLD Management  Updates made by Vanita Ingles since 05/18/2020 12:00 AM    Problem: RNCM: Coping Skills (General Plan of Care)   Priority: Medium    Goal: RNCM: HLD   Priority: Medium  Note:   Current Barriers:   Poorly controlled hyperlipidemia, complicated by non-compliance, DM, HTN, behavioral health issues   Current antihyperlipidemic regimen: Atorvastatin 40 mg QD  Most recent lipid panel:     Component Value Date/Time   CHOL 175 02/23/2020 0654   TRIG 275 (H) 02/23/2020 0654   HDL 42 02/23/2020 0654   CHOLHDL 4.2 02/23/2020 0654   VLDL 55 (H) 02/23/2020 0654   [place] 78 02/23/2020 0654   [place] 135 (H) 11/07/2017 0925     ASCVD risk enhancing conditions: age 48, DM, HTN  Unable to self administer medications as prescribed  Does not attend all  scheduled provider appointments  Does not adhere to prescribed medication regimen  Does not adhere to prescribed psychotropic medication regimen  Lacks social connections  Unable to perform IADLs independently  Does not maintain contact with provider office  Does not contact provider office for questions/concerns  RN Care Manager Clinical Goal(s):   Over the next 120 days, patient will work with Consulting civil engineer, providers, and care team towards execution of optimized self-health management plan  Over the next 120 days, patient will verbalize understanding of plan for effective management of HLD  Over the next 120 days, patient will work with [facility] and CCM team  to address needs related to effective management of HLD   Interventions:  Collaboration with Parks Ranger, Devonne Doughty, DO regarding development and update of comprehensive plan of care as evidenced by provider attestation and co-signature  Inter-disciplinary care team collaboration (see longitudinal plan of care)  Medication review performed; medication list updated in electronic medical record.   Inter-disciplinary care team collaboration (see longitudinal plan of care)  Referred to pharmacy team for assistance with HLD medication management  Evaluation of current treatment plan related to HLD and patient's adherence to plan as established by provider.  Advised patient to call the office for changes in condition or new quesitons  Discussed plans with patient for ongoing care management follow up and provided patient with direct contact information for care management team  Advised to take medications as directed, reviewed if  the patient has adequate supply of medications.   Patient Goals/Self-Care Activities:  Over the next 120 days, patient will:   - call for medicine refill 2 or 3 days before it runs out - call if I am sick and can't take my medicine - keep a list of all the medicines I take; vitamins and  herbals too - learn to read medicine labels - use a pillbox to sort medicine - use an alarm clock or phone to remind me to take my medicine - change to whole grain breads, cereal, pasta - drink 6 to 8 glasses of water each day - eat 5 or 6 small meals each day - fill half the plate with nonstarchy vegetables - limit fast food meals to no more than 1 per week - manage portion size - prepare main meal at home 3 to 5 days each week - read food labels for fat, fiber, carbohydrates and portion size - be open to making changes - I can manage, know and watch for signs of a heart attack - if I have chest pain, call for help - learn about small changes that will make a big difference - learn my personal risk factors  - active listening utilized - counseling provided - current coping strategies identified - decision-making supported - mindfulness encouraged - problem-solving facilitated - self-reflection promoted - spiritual activities promoted - verbalization of feelings encouraged  Follow Up Plan: Telephone follow up appointment with care management team member scheduled for: 07-06-2020 at 11:45 am     Task: RNCM: HLD Support Psychosocial Response to Risk or Actual Health Condition   Note:   Care Management Activities:    - active listening utilized - counseling provided - current coping strategies identified - decision-making supported - mindfulness encouraged - problem-solving facilitated - self-reflection promoted - spiritual activities promoted - verbalization of feelings encouraged        Plan:Telephone follow up appointment with care management team member scheduled for:  07-06-2020 at 11:45 am  Noreene Larsson RN, MSN, [place] Mobile: [phone]

## 2020-05-18 NOTE — Patient Instructions (Signed)
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 24, DM, HTN . Unable to self administer medications as prescribed . Does not attend all scheduled provider appointments . Does not adhere to prescribed medication regimen . Does not adhere to prescribed psychotropic medication regimen . Lacks social connections . Unable to perform IADLs independently . Does not maintain contact with provider office . Does not contact provider office for questions/concerns  RN Care Manager Clinical Goal(s):  [name] Over the next 120 days, patient will work with Consulting civil engineer, providers, and care team towards execution of optimized self-health management plan . Over the next 120 days, patient will verbalize understanding of plan for effective management of HLD . Over the next 120 days, patient will work with [facility] and CCM team  to address needs related to effective management of HLD   Interventions: . Collaboration with [name], DO regarding development and update of comprehensive plan of care as evidenced by provider attestation and co-signature . Inter-disciplinary care team collaboration (see longitudinal plan of  care) . Medication review performed; medication list updated in electronic medical record.  Bertram Savin care team collaboration (see longitudinal plan of care) . Referred to pharmacy team for assistance with HLD medication management . Evaluation of current treatment plan related to HLD and patient's adherence to plan as established by provider. . Advised patient to call the office for changes in condition or new quesitons . Discussed plans with patient for ongoing care management follow up and provided patient with direct contact information for care management team . Advised to take medications as directed, reviewed if the patient has adequate supply of medications.   Patient Goals/Self-Care Activities: . Over the next 120 days, patient will:   - call for medicine refill 2 or 3 days before it runs out - call if I am sick and can't take my medicine - keep a list of all the medicines I take; vitamins and herbals too - learn to read medicine labels - use a pillbox to sort medicine - use an alarm clock or phone to remind me to take my medicine - change to whole grain breads, cereal, pasta - drink 6 to 8 glasses of water each day - eat 5 or 6 small meals each day - fill half the plate with nonstarchy vegetables - limit fast food meals to no  more than 1 per week - manage portion size - prepare main meal at home 3 to 5 days each week - read food labels for fat, fiber, carbohydrates and portion size - be open to making changes - I can manage, know and watch for signs of a heart attack - if I have chest pain, call for help - learn about small changes that will make a big difference - learn my personal risk factors  - active listening utilized - counseling provided - current coping strategies identified - decision-making supported - mindfulness encouraged - problem-solving facilitated - self-reflection promoted - spiritual activities promoted - verbalization of feelings  encouraged  Follow Up Plan: Telephone follow up appointment with care management team member scheduled for: 07-06-2020 at 11:45 am     Task: RNCM: HLD Support Psychosocial Response to Risk or Actual Health Condition   Note:   Care Management Activities:    - active listening utilized - counseling provided - current coping strategies identified - decision-making supported - mindfulness encouraged - problem-solving facilitated - self-reflection promoted - spiritual activities promoted - verbalization of feelings encouraged        Patient verbalizes understanding of instructions provided today.  Telephone follow up appointment with care management team member scheduled for: 07-06-2020 at 11:45 am  Noreene Larsson RN, MSN, [place] Mobile: [phone]

## 2020-05-19 ENCOUNTER — Inpatient Hospital Stay: Payer: Medicaid Other | Admitting: Family Medicine

## 2020-05-21 ENCOUNTER — Telehealth: Payer: Self-pay

## 2020-05-21 DIAGNOSIS — L97522 Non-pressure chronic ulcer of other part of left foot with fat layer exposed: Secondary | ICD-10-CM

## 2020-05-21 DIAGNOSIS — M14672 Charcot's joint, left ankle and foot: Secondary | ICD-10-CM

## 2020-05-21 NOTE — Telephone Encounter (Signed)
Copied from [place] [id number]. Topic: General - Inquiry >> May 21, 2020 12:55 PM Gillis Ends D wrote: Reason for CRM: Anne Ng from [facility] called and needs a diagnosis code for a walking boot on patients left foot. The patient says she has had it on since August. They would like some clarity on when boot placed on and what's it for. She can be reached at [phone]. Please advise

## 2020-05-21 NOTE — Telephone Encounter (Signed)
Done. Thanks  [name], [facility] 05/21/2020, 3:03 PM

## 2020-05-21 NOTE — Telephone Encounter (Signed)
Ok. Yes I did  Not realize she was DC'd from [place].  She will need a new Podiatrist.  We can refer to [place]. Through [place].  Can you clarify which location patient would like to go to?  [place]  And I can place referral  [name], [facility] 05/21/2020, 2:48 PM

## 2020-05-21 NOTE — Telephone Encounter (Signed)
I spoke with the patient's caregiver and she said that she called Dr. Deborra Medina office and she was informed that the patient was dismissed from his office at [facility]. She said that the patient has some sores from the boot rubbing that needs to be evaluated. I advised that she may need a new referral but that I would have you advise on that and I would get back in touch with her.

## 2020-05-21 NOTE — Telephone Encounter (Signed)
Please refer her to the [place] office as urgent per her caseworker please.

## 2020-05-21 NOTE — Telephone Encounter (Signed)
Please notify [facility] that they can contact her Podiatrist for more information. This is what I can see from the chart.  [name], [place], [place] [address]  [phone]  [facility] Left [facility] [phone])  I do not know when boot was placed, looks like Spring or Summer 2021.  [name], [facility] 05/21/2020, 2:23 PM

## 2020-05-25 ENCOUNTER — Other Ambulatory Visit: Payer: Self-pay

## 2020-05-25 ENCOUNTER — Ambulatory Visit: Payer: Self-pay | Admitting: Podiatry

## 2020-05-25 NOTE — Patient Instructions (Signed)
Visit Information  [name]  - as a part of your Medicaid benefit, you are eligible for care management and care coordination services at no cost or copay. I was unable to reach you by phone today but would be happy to help you with your health related needs. Please feel free to call me @ [phone].   A member of the Managed Medicaid care management team will reach out to you again over the next 7 days.   Mickel Fuchs, BSW, [place]  High Risk Managed Medicaid Team

## 2020-05-25 NOTE — Patient Outreach (Signed)
Care Coordination  05/25/2020  [name] 09/07/1965 [id number]   Medicaid Managed Care   Unsuccessful Outreach Note  05/25/2020 Name: [name] MRN: [id number] DOB: 1965-05-10  Referred by: [name], DO Reason for referral : High Risk Managed Medicaid (HR MM Unsuccessful Telephone Outreach)   An unsuccessful telephone outreach was attempted today. The patient was referred to the case management team for assistance with care management and care coordination.   Follow Up Plan: The care management team will reach out to the patient again over the next 7 days.   Mickel Fuchs, BSW, [place]  High Risk Managed Medicaid Team

## 2020-05-26 ENCOUNTER — Ambulatory Visit (INDEPENDENT_AMBULATORY_CARE_PROVIDER_SITE_OTHER): Payer: Medicare Other | Admitting: Family Medicine

## 2020-05-26 ENCOUNTER — Encounter: Payer: Self-pay | Admitting: Family Medicine

## 2020-05-26 ENCOUNTER — Other Ambulatory Visit: Payer: Self-pay

## 2020-05-26 ENCOUNTER — Ambulatory Visit: Payer: Self-pay | Admitting: Licensed Clinical Social Worker

## 2020-05-26 VITALS — BP 132/75 | HR 86 | Ht 70.0 in | Wt 240.0 lb

## 2020-05-26 DIAGNOSIS — F201 Disorganized schizophrenia: Secondary | ICD-10-CM | POA: Diagnosis not present

## 2020-05-26 DIAGNOSIS — D509 Iron deficiency anemia, unspecified: Secondary | ICD-10-CM

## 2020-05-26 DIAGNOSIS — E860 Dehydration: Secondary | ICD-10-CM | POA: Diagnosis not present

## 2020-05-26 DIAGNOSIS — N1831 Chronic kidney disease, stage 3a: Secondary | ICD-10-CM

## 2020-05-26 NOTE — Progress Notes (Addendum)
Subjective:    Patient ID: [name], female    DOB: [birth year], 55 y.o.   MRN: [id number]  [name] is a 55 y.o. female presenting on 05/26/2020 for Hospitalization Follow-up and Nausea  Patient is unaccompanied at her visit today. She utilized transportation to get here.  HPI  HOSPITAL FOLLOW-UP VISIT  Hospital/Location: [facility] Date of Admission: 05/08/20 Date of Discharge: 05/11/20 Transitions of care telephone call: 05/12/20 completed by  Kellie Simmering LPN  Reason for Admission: Dehydration CKD Primary (+Secondary) Diagnosis: diarrhea, hyperglycemia, dehydration  - Hospital H&P and Discharge Summary have been reviewed - Patient presents today 15 days  after recent hospitalization. Brief summary of recent course, patient had symptoms of nausea vomiting, diarrhea, dehydration hospitalized, treated with IV fluid rehydration, her lasix was discontinued, creatinine improved to 1.09, had dehydration.  - Today reports overall has done well after discharge. Symptoms of dehydration have improved. Still improving PO intake fluid, she is going to bathroom often, now today some reduced urination.  - She is using Lantus 25u nightly and Novolog 5mg  BID PRN with meal if hyperglycemia. She has dosing regimen from Endocrinology at home, has not returned to them recently, wants to re-established. Her last A1c was >10 in 05/2020 at hospital. - She is back on glucometer checking sugars more regularly.  Admits feeling fatigued at times.  She has history of iron deficiency anemia, and last Hgb in hospital was 9.2 Hgb, prior range in 10s. Previously IV iron infusions by [facility] Hematology.  She has upcoming Podiatry apt at [facility] tomorrow, for her boot and chronic charcot foot issue.  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 She feels overall her mental health has improved.  I have reviewed the discharge  medication list, and have reconciled the current and discharge medications today.   Current Outpatient Medications:  .  acetaminophen (TYLENOL) 325 MG tablet, Take 2 tablets (650 mg total) by mouth every 6 (six) hours as needed for mild pain., Disp:  , Rfl:  .  ARIPiprazole (ABILIFY) 15 MG tablet, Take 1 tablet (15 mg total) by mouth at bedtime., Disp: 30 tablet, Rfl: 0 .  atorvastatin (LIPITOR) 40 MG tablet, Take 1 tablet (40 mg total) by mouth daily., Disp: 30 tablet, Rfl: 1 .  buPROPion (WELLBUTRIN XL) 300 MG 24 hr tablet, TAKE 1 TABLET BY MOUTH EACH MORNING., Disp: 30 tablet, Rfl: 0 .  colestipol (COLESTID) 1 g tablet, TAKE (2) TABLETS BY MOUTH TWICE DAILY., Disp: 120 tablet, Rfl: 0 .  furosemide (LASIX) 40 MG tablet, Take 40 mg by mouth daily., Disp: , Rfl:  .  gabapentin (NEURONTIN) 300 MG capsule, Take 1 capsule (300 mg total) by mouth at bedtime., Disp: 30 capsule, Rfl: 1 .  hydrochlorothiazide (HYDRODIURIL) 12.5 MG tablet, Take 1 tablet (12.5 mg total) by mouth daily., Disp: 30 tablet, Rfl: 1 .  insulin glargine (LANTUS SOLOSTAR) 100 UNIT/ML Solostar Pen, Inject 25 Units into the skin at bedtime., Disp: , Rfl:  .  insulin lispro (HUMALOG) 100 UNIT/ML injection, Inject 5 Units into the skin 2 (two) times daily with a meal., Disp: , Rfl:  .  Lancets (ONETOUCH DELICA PLUS [id number]) MISC, Apply topically., Disp: , Rfl:  .  metFORMIN (GLUCOPHAGE) 1000 MG tablet, Take 1 tablet (1,000 mg total) by mouth daily with breakfast., Disp: 30 tablet, Rfl: 1 .  metoprolol succinate (TOPROL-XL) 50 MG 24 hr tablet, Take 1 tablet (  50 mg total) by mouth daily. Take with or immediately following a meal., Disp: 30 tablet, Rfl: 1 .  ondansetron (ZOFRAN) 4 MG tablet, Take 1 tablet (4 mg total) by mouth every 6 (six) hours., Disp: 12 tablet, Rfl: 0 .  ONETOUCH VERIO test strip, CHECK BLOOD SUGAR 3 TIMES DAILY., Disp: 100 strip, Rfl: 0 .  pantoprazole (PROTONIX) 40 MG tablet, Take 40 mg by mouth 2 (two) times  daily., Disp: , Rfl:  .  traZODone (DESYREL) 50 MG tablet, Take 50 mg by mouth at bedtime as needed., Disp: , Rfl:   ------------------------------------------------------------------------- Social History   Tobacco Use  . Smoking status: Former Smoker    Packs/day: 3.00    Years: 30.00    Pack years: 90.00    Types: Cigarettes    Quit date: 05/02/2009    Years since quitting: 11.0  . Smokeless tobacco: Never Used  . Tobacco comment: Pt reported  quitting in 2011  Vaping Use  . Vaping Use: Never used  Substance Use Topics  . Alcohol use: No  . Drug use: No    Review of Systems Per HPI unless specifically indicated above     Objective:    BP 132/75   Pulse 86   Ht 5\' 10"  (1.778 m)   Wt 240 lb (108.9 kg)   SpO2 100%   BMI 34.44 kg/m   Wt Readings from Last 3 Encounters:  05/26/20 240 lb (108.9 kg)  05/08/20 234 lb (106.1 kg)  04/17/20 238 lb (108 kg)    Physical Exam Vitals and nursing note reviewed.  Constitutional:      General: She is not in acute distress.    Appearance: She is well-developed. She is obese. She is not diaphoretic.     Comments: Well-appearing, comfortable, cooperative  HENT:     Head: Normocephalic and atraumatic.  Eyes:     General:        Right eye: No discharge.        Left eye: No discharge.     Conjunctiva/sclera: Conjunctivae normal.  Cardiovascular:     Rate and Rhythm: Normal rate.  Pulmonary:     Effort: Pulmonary effort is normal.  Musculoskeletal:     Comments: Left foot walker boot on  Wheel chair   Skin:    General: Skin is warm and dry.     Findings: No erythema or rash.  Neurological:     Mental Status: She is alert and oriented to person, place, and time.  Psychiatric:        Behavior: Behavior normal.     Comments: Well groomed, good eye contact, speech is mostly normal, thoughts normal, improved from last visit.        Results for orders placed or performed during the hospital encounter of 05/08/20  SARS  CORONAVIRUS 2 (TAT 6-24 HRS) Nasopharyngeal Nasopharyngeal Swab   Specimen: Nasopharyngeal Swab  Result Value Ref Range   SARS Coronavirus 2 NEGATIVE NEGATIVE  CBC  Result Value Ref Range   WBC 10.3 4.0 - 10.5 K/uL   RBC 3.47 (L) 3.87 - 5.11 MIL/uL   Hemoglobin 10.3 (L) 12.0 - 15.0 g/dL   HCT 32.3 (L) 36.0 - 46.0 %   MCV 93.1 80.0 - 100.0 fL   MCH 29.7 26.0 - 34.0 pg   MCHC 31.9 30.0 - 36.0 g/dL   RDW 15.1 11.5 - 15.5 %   Platelets 228 150 - 400 K/uL   nRBC 0.0 0.0 - 0.2 %  Basic metabolic panel  Result Value Ref Range   Sodium 134 (L) 135 - 145 mmol/L   Potassium 4.4 3.5 - 5.1 mmol/L   Chloride 104 98 - 111 mmol/L   CO2 20 (L) 22 - 32 mmol/L   Glucose, Bld 284 (H) 70 - 99 mg/dL   BUN 53 (H) 6 - 20 mg/dL   Creatinine, Ser 3.05 (H) 0.44 - 1.00 mg/dL   Calcium 8.9 8.9 - 10.3 mg/dL   GFR, Estimated 17 (L) >60 mL/min   Anion gap 10 5 - 15  HIV Antibody (routine testing w rflx)  Result Value Ref Range   HIV Screen 4th Generation wRfx Non Reactive Non Reactive  Hemoglobin A1c  Result Value Ref Range   Hgb A1c MFr Bld 10.1 (H) 4.8 - 5.6 %   Mean Plasma Glucose 243.17 mg/dL  CBC  Result Value Ref Range   WBC 8.3 4.0 - 10.5 K/uL   RBC 3.06 (L) 3.87 - 5.11 MIL/uL   Hemoglobin 9.2 (L) 12.0 - 15.0 g/dL   HCT 28.7 (L) 36.0 - 46.0 %   MCV 93.8 80.0 - 100.0 fL   MCH 30.1 26.0 - 34.0 pg   MCHC 32.1 30.0 - 36.0 g/dL   RDW 15.2 11.5 - 15.5 %   Platelets 200 150 - 400 K/uL   nRBC 0.0 0.0 - 0.2 %  Basic metabolic panel  Result Value Ref Range   Sodium 134 (L) 135 - 145 mmol/L   Potassium 4.3 3.5 - 5.1 mmol/L   Chloride 107 98 - 111 mmol/L   CO2 21 (L) 22 - 32 mmol/L   Glucose, Bld 230 (H) 70 - 99 mg/dL   BUN 43 (H) 6 - 20 mg/dL   Creatinine, Ser 1.95 (H) 0.44 - 1.00 mg/dL   Calcium 8.5 (L) 8.9 - 10.3 mg/dL   GFR, Estimated 30 (L) >60 mL/min   Anion gap 6 5 - 15  Glucose, capillary  Result Value Ref Range   Glucose-Capillary 201 (H) 70 - 99 mg/dL  Glucose, capillary  Result  Value Ref Range   Glucose-Capillary 171 (H) 70 - 99 mg/dL  Glucose, capillary  Result Value Ref Range   Glucose-Capillary 137 (H) 70 - 99 mg/dL  Renal function panel  Result Value Ref Range   Sodium 138 135 - 145 mmol/L   Potassium 4.5 3.5 - 5.1 mmol/L   Chloride 109 98 - 111 mmol/L   CO2 22 22 - 32 mmol/L   Glucose, Bld 249 (H) 70 - 99 mg/dL   BUN 28 (H) 6 - 20 mg/dL   Creatinine, Ser 1.34 (H) 0.44 - 1.00 mg/dL   Calcium 9.1 8.9 - 10.3 mg/dL   Phosphorus 2.5 2.5 - 4.6 mg/dL   Albumin 3.3 (L) 3.5 - 5.0 g/dL   GFR, Estimated 47 (L) >60 mL/min   Anion gap 7 5 - 15  Glucose, capillary  Result Value Ref Range   Glucose-Capillary 180 (H) 70 - 99 mg/dL  Glucose, capillary  Result Value Ref Range   Glucose-Capillary 207 (H) 70 - 99 mg/dL  Glucose, capillary  Result Value Ref Range   Glucose-Capillary 264 (H) 70 - 99 mg/dL   Comment 1 Notify RN    Comment 2 Document in Chart   Glucose, capillary  Result Value Ref Range   Glucose-Capillary 163 (H) 70 - 99 mg/dL   Comment 1 Notify RN    Comment 2 Document in Chart   Renal function panel  Result Value  Ref Range   Sodium 137 135 - 145 mmol/L   Potassium 4.6 3.5 - 5.1 mmol/L   Chloride 108 98 - 111 mmol/L   CO2 21 (L) 22 - 32 mmol/L   Glucose, Bld 193 (H) 70 - 99 mg/dL   BUN 21 (H) 6 - 20 mg/dL   Creatinine, Ser 1.09 (H) 0.44 - 1.00 mg/dL   Calcium 9.0 8.9 - 10.3 mg/dL   Phosphorus 2.3 (L) 2.5 - 4.6 mg/dL   Albumin 3.3 (L) 3.5 - 5.0 g/dL   GFR, Estimated 60 (L) >60 mL/min   Anion gap 8 5 - 15  Glucose, capillary  Result Value Ref Range   Glucose-Capillary 165 (H) 70 - 99 mg/dL  Glucose, capillary  Result Value Ref Range   Glucose-Capillary 161 (H) 70 - 99 mg/dL   Comment 1 Notify RN    Comment 2 Document in Chart   Glucose, capillary  Result Value Ref Range   Glucose-Capillary 218 (H) 70 - 99 mg/dL   Comment 1 Notify RN    Comment 2 Document in Chart   Glucose, capillary  Result Value Ref Range   Glucose-Capillary  230 (H) 70 - 99 mg/dL  CBG monitoring, ED  Result Value Ref Range   Glucose-Capillary 331 (H) 70 - 99 mg/dL  CBG monitoring, ED  Result Value Ref Range   Glucose-Capillary 272 (H) 70 - 99 mg/dL  CBG monitoring, ED  Result Value Ref Range   Glucose-Capillary 243 (H) 70 - 99 mg/dL      Assessment & Plan:   Problem List Items Addressed This Visit    Schizophrenia ([place]) (Chronic)   Iron deficiency anemia   Relevant Orders   CBC with Differential/Platelet    Other Visit Diagnoses    Dehydration    -  Primary   Relevant Orders   Comprehensive metabolic panel   Stage 3a chronic kidney disease ([place])       Relevant Orders   CBC with Differential/Platelet   Comprehensive metabolic panel     #AoCKD III / Dehydration / Hyperglycemia Recent hospitalization at [facility] in [place] early January 2022 Now improved s/p hospitalization with IVF rehydration, and treatment CBGs improved control, back on insulin regimen She is checking sugar more often now  #Anemia Had low Hgb at 9.2 on last lab in hospital, needs repeat labs 1-2 week after hospitalization, already 2 weeks out, will order labs for [facility] in [place] she can go there for lab drawn, printed order req and given to her today. - If upcoming Hgb still low 9-10 or lower, she may need to schedule w/ Hematology for repeat iron IV infusion, and or can start some oral iron 1-2 times a day with meal. Ferrous sulfate 325mg   #T2DM Continue using insulin regimen per endocrinology Last A1c >10 05/2020, will need repeat within 3 months approx 07/2020 She may return to [facility] endocrinology or relocate to other endocrine in [place]  She is working with [employer] now - I discussed with patient today that she may benefit from transfer of primary care - PCP and her specialty care to [place]  locally to her, as transportation has been a big barrier to her care in future. She agrees and intended to this a while ago, she is  interested to stay with [facility], will provide her with some information today to help her facilitate this. And she will contact her specialists currently and follow up or transfer care.   No orders of the  defined types were placed in this encounter.   Follow up plan: Return if symptoms worsen or fail to improve, for may switch to PCP in [place] in future.[name], DO [facility] 05/26/2020, 2:15 PM

## 2020-05-26 NOTE — Chronic Care Management (AMB) (Signed)
  Care Management   Follow Up Note   05/26/2020 Name: [name] MRN: [id number] DOB: February 29, 1966  Referred by: [name], DO Reason for referral : No chief complaint on file.   [name] is a 55 y.o. year old female who is a primary care patient of [name], DO. The care management team was consulted for assistance with care management and care coordination needs.    Review of patient status, including review of consultants reports, relevant laboratory and other test results, and collaboration with appropriate care team members and the patient's provider was performed as part of comprehensive patient evaluation and provision of chronic care management services.    LCSW completed CCM outreach attempt today but was unable to reach patient successfully. A HIPPA compliant voice message was left encouraging patient to return call once available. LCSW will reschedule patient's CCM Social Work appointment if no return call has been made.  A HIPPA compliant phone message was left for the patient providing contact information and requesting a return call.   [name], BSW, MSW, [email or web address] Phone: [phone]

## 2020-05-26 NOTE — Patient Instructions (Addendum)
Thank you for coming to the office today.  Please call this PCP office through [facility] in [place], I think it would make sense to transfer your primary care to them, and you can get linked up with new specialists in [place] as well.  [facility] [address] [place],  [place]  [address] Main: [phone]  -------------------  Blood this week or next week as soon as you can.  [facility] Address: [place], [place], [place] [address] Phone: [phone]    GI Specialist - please call them to setup future follow-up with your nausea and symptoms.  [name], M.D. Gastroenterology & Hepatology [facility] [address] [place], [place] [address]   ----------------  Diabetes Endocrinology [facility])  [name] NP [address] [place], [place] [address]  Phone: [phone]   ---------------------------  Call your Hematology - Iron doctor who did the iron infusion  Last lab showed Hemoglobin reduced to 9.2 (05/09/20) which is low and can cause some of your symptoms.  [facility]  [phone]   ---   [facility]) Address: [address], [place], [place] [address] Phone: [phone]   Please schedule a Follow-up Appointment to: Return if symptoms worsen or fail to improve, for may switch to PCP in [place] in future..  If you have any other questions or concerns, please feel free to call the office or send a message through [place]. You may also schedule an earlier appointment if necessary.  Additionally, you may be receiving a survey about your experience at our office within a few days to 1 week by e-mail or mail. We value your feedback.  [name], DO [place]

## 2020-05-27 ENCOUNTER — Ambulatory Visit (INDEPENDENT_AMBULATORY_CARE_PROVIDER_SITE_OTHER): Payer: Medicare Other | Admitting: Podiatry

## 2020-05-27 ENCOUNTER — Ambulatory Visit (INDEPENDENT_AMBULATORY_CARE_PROVIDER_SITE_OTHER): Payer: Medicare Other

## 2020-05-27 ENCOUNTER — Encounter: Payer: Self-pay | Admitting: Podiatry

## 2020-05-27 DIAGNOSIS — E1149 Type 2 diabetes mellitus with other diabetic neurological complication: Secondary | ICD-10-CM

## 2020-05-27 DIAGNOSIS — E1142 Type 2 diabetes mellitus with diabetic polyneuropathy: Secondary | ICD-10-CM | POA: Diagnosis not present

## 2020-05-27 DIAGNOSIS — IMO0002 Reserved for concepts with insufficient information to code with codable children: Secondary | ICD-10-CM

## 2020-05-27 DIAGNOSIS — M14672 Charcot's joint, left ankle and foot: Secondary | ICD-10-CM

## 2020-05-27 DIAGNOSIS — M79675 Pain in left toe(s): Secondary | ICD-10-CM

## 2020-05-27 DIAGNOSIS — B351 Tinea unguium: Secondary | ICD-10-CM

## 2020-05-27 DIAGNOSIS — E1165 Type 2 diabetes mellitus with hyperglycemia: Secondary | ICD-10-CM

## 2020-05-27 DIAGNOSIS — M79674 Pain in right toe(s): Secondary | ICD-10-CM

## 2020-05-27 MED ORDER — MUPIROCIN 2 % EX OINT: 1.0000 "application " | TOPICAL_OINTMENT | Freq: Two times a day (BID) | CUTANEOUS | 2 refills | Status: DC

## 2020-05-27 NOTE — Patient Instructions (Signed)
Call [place] clinic to have your CROW boot adjusted   CONTACT [place], [place] [address] Phone: [phone] Fax: [phone]  HOURS Monday - Friday, 8 a.m. - 5 p.m.

## 2020-05-28 NOTE — Progress Notes (Signed)
  Subjective:  Patient ID: [name], female    DOB: 1965-05-10,  MRN: [id number]  Chief Complaint  Patient presents with  . Foot Pain    Patient presents today with Charcot foot left and she is concerned about a red area on medial side left heel x couple of weeks.  She also has rough callous on side of right ankle and request nail trim   She denies any pain with the areas    55 y.o. female presents with the above complaint. History confirmed with patient.  Previously was treated for left foot Charcot at [facility] clinic.  She uses a CROW AFO.  Thinks might be rubbing the inside of her heel left side  Objective:  Physical Exam: warm, good capillary refill and normal DP and PT pulses.  Gross loss of light touch and protective sensation throughout the lower extremities.  Plantigrade foot on the left side.  There is abrasion of the medial heel.  She has a scab overlying the medial ankle onychomycosis x10 with thickening, elongated toenails with subungual debris and yellow discoloration Assessment:   1. Charcot's joint of foot, left      Plan:  Patient was evaluated and treated and all questions answered.   Patient educated on diabetes. Discussed proper diabetic foot care and discussed risks and complications of disease. Educated patient in depth on reasons to return to the office immediately should he/she discover anything concerning or new on the feet. All questions answered. Discussed proper shoes as well.    Currently her Charcot is quiesced sent, she is in a CROW and possible this is beginning to irritate the inside of her heel.  I sent her a referral to [facility] to have this adjusted.  She discussed with me if it would be possible to move out of this into a boot and I discussed the risk of redeveloping acute Charcot and further collapse of medial arch, ulceration and infection necessitating amputation.  She will continue to use the walker.  Discussed the etiology and  treatment options for the condition in detail with the patient. Educated patient on the topical and oral treatment options for mycotic nails. Recommended debridement of the nails today. Sharp and mechanical debridement performed of all painful and mycotic nails today. Nails debrided in length and thickness using a nail nipper to level of comfort. Discussed treatment options including appropriate shoe gear. Follow up as needed for painful nails.    Return in about 3 months (around 08/25/2020) for at risk diabetic foot care.

## 2020-05-30 IMAGING — US US EXTREM LOW VENOUS*R*
1 series · 13 of 24 positions shown · non-contrast
Comparison: None.

CLINICAL DATA: Leg swelling.  Pain.



[Series 1: us extrem low venous*right* · 13 of 37 slices shown]
[im 1/37]
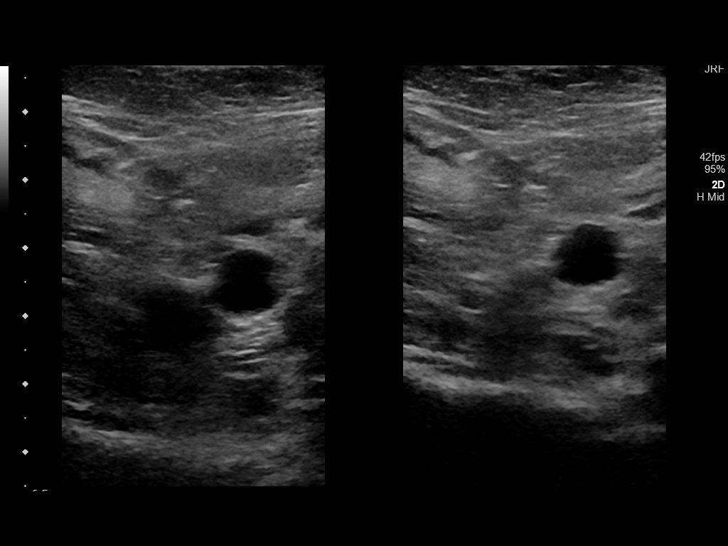
[im 4/37]
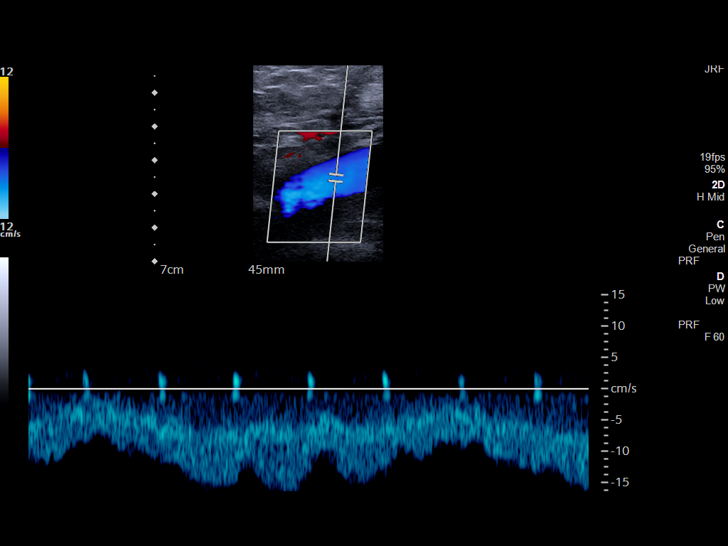
[im 7/37]
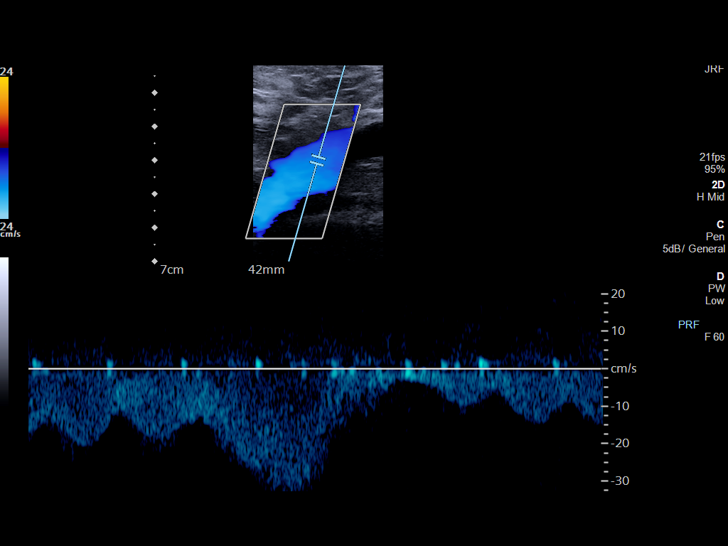
[im 10/37]
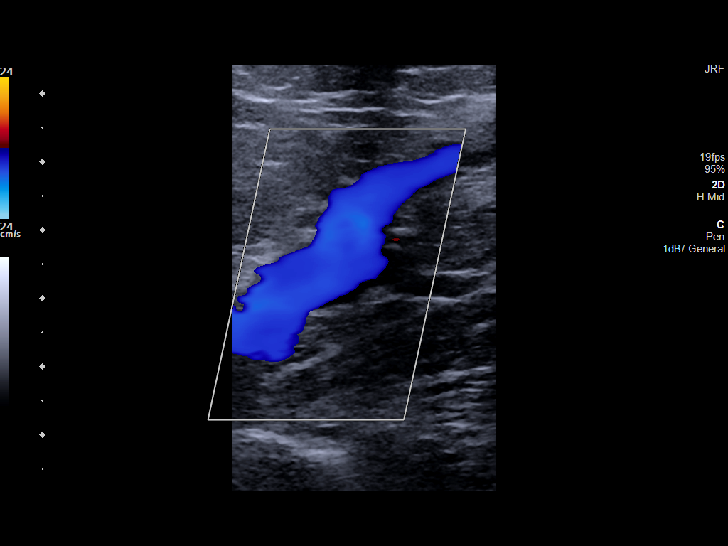
[im 13/37]
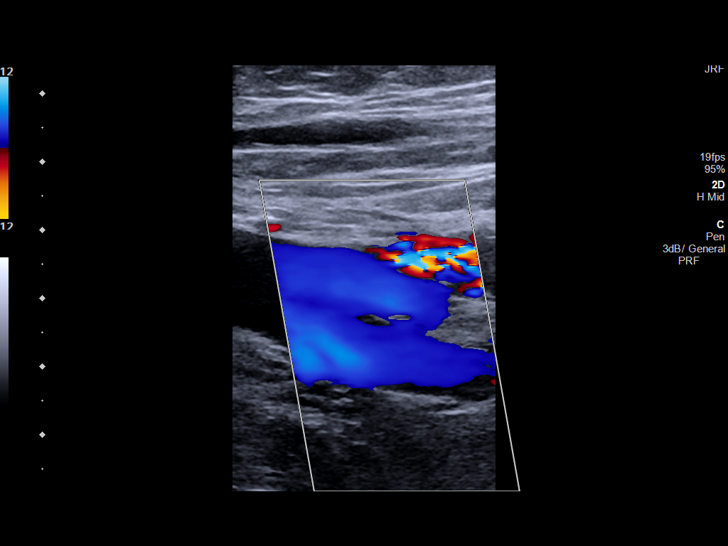
[im 16/37]
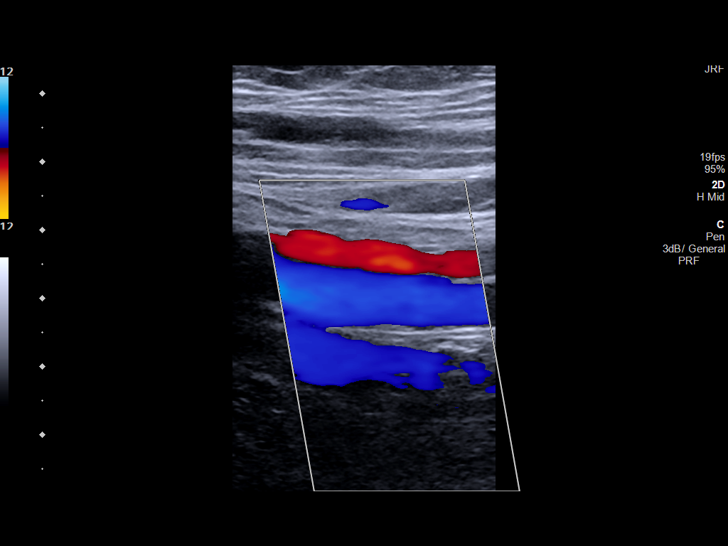
[im 19/37]
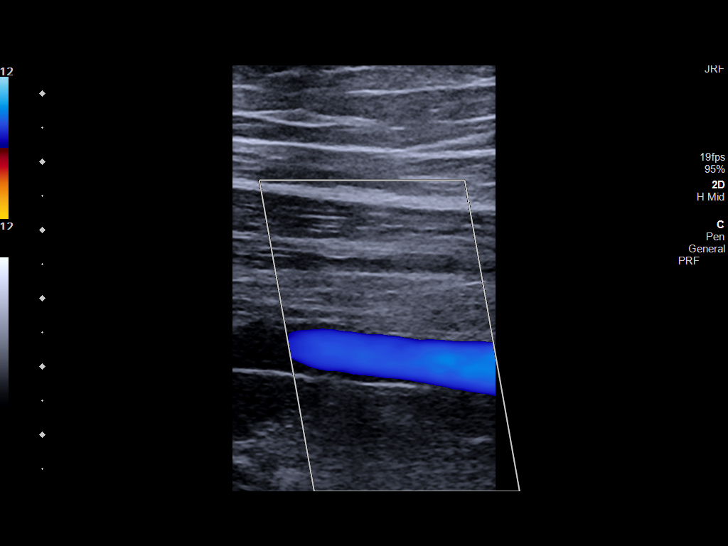
[im 21/37]
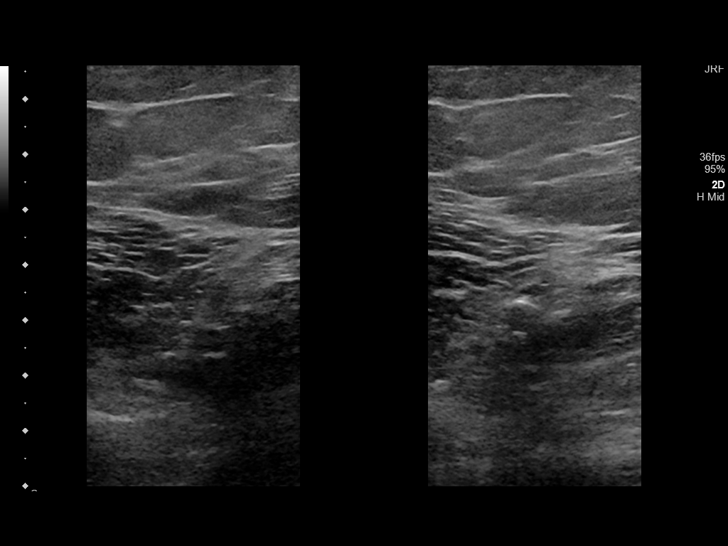
[im 24/37]
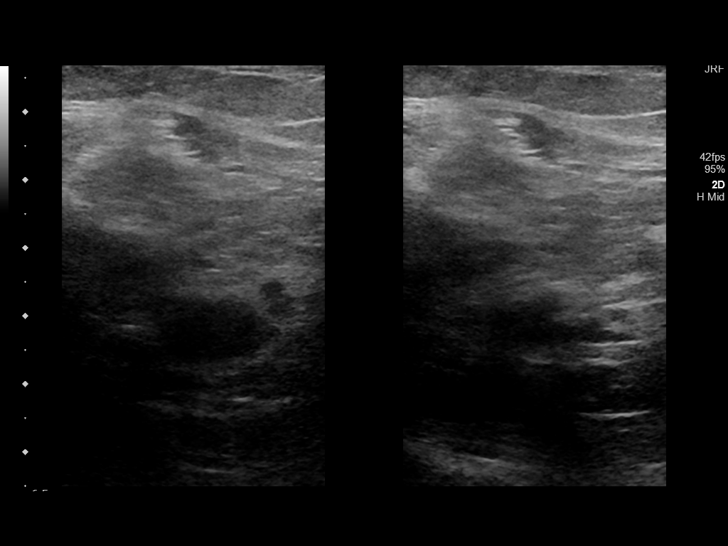
[im 27/37]
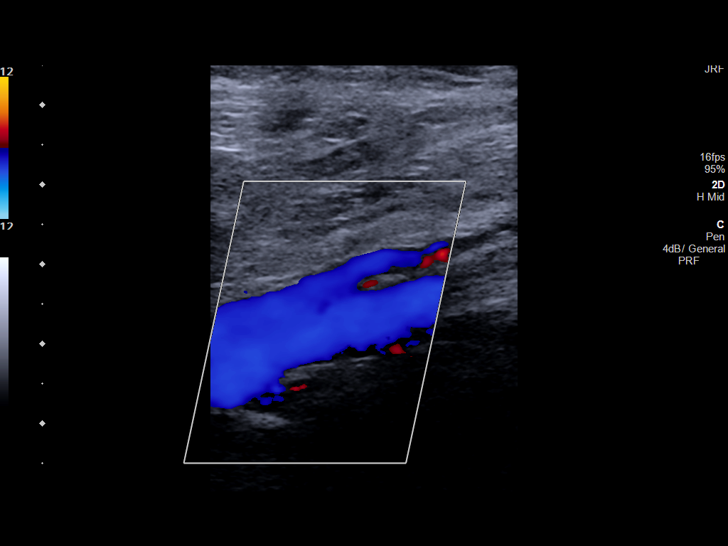
[im 30/37]
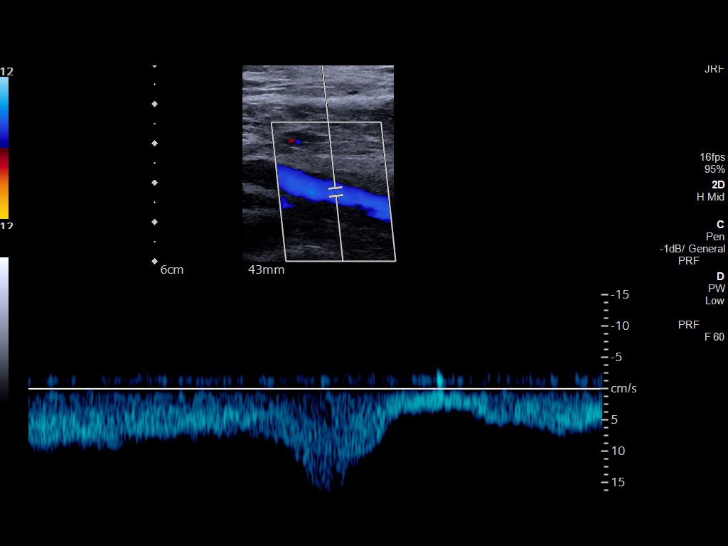
[im 33/37]
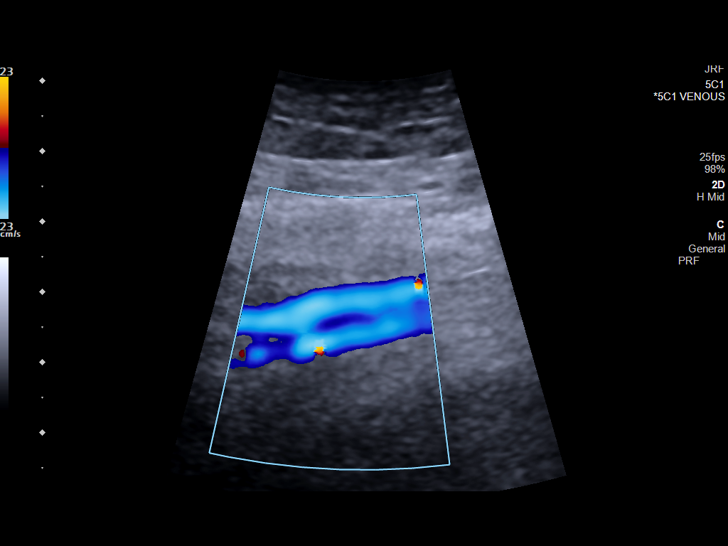
[im 37/37]
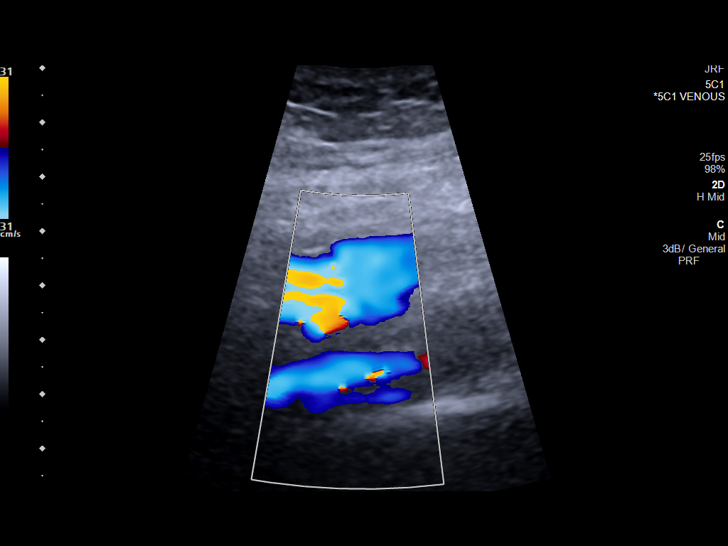

[13 of 24 positions shown; findings below may reference images not displayed]

FINDINGS: Contralateral Common Femoral Vein: Respiratory phasicity is normal
and symmetric with the symptomatic side. No evidence of thrombus.
Normal compressibility.

Common Femoral Vein: No evidence of thrombus. Normal
compressibility, respiratory phasicity and response to augmentation.

Saphenofemoral Junction: No evidence of thrombus. Normal
compressibility and flow on color Doppler imaging.

Profunda Femoral Vein: No evidence of thrombus. Normal
compressibility and flow on color Doppler imaging.

Femoral Vein: No evidence of thrombus. Normal compressibility,
respiratory phasicity and response to augmentation.

Popliteal Vein: No evidence of thrombus. Normal compressibility,
respiratory phasicity and response to augmentation.

Calf Veins: Evaluation of the peroneal vein was limited by
suboptimal compression. There is no obvious thrombus involving the
tibial veins.

Superficial Great Saphenous Vein: No evidence of thrombus. Normal
compressibility.

Venous Reflux:  None.

Other Findings:  None.
IMPRESSION: No evidence of deep venous thrombosis.

## 2020-06-03 ENCOUNTER — Other Ambulatory Visit: Payer: Self-pay

## 2020-06-03 ENCOUNTER — Ambulatory Visit (INDEPENDENT_AMBULATORY_CARE_PROVIDER_SITE_OTHER): Payer: Medicare HMO | Admitting: Licensed Clinical Social Worker

## 2020-06-03 ENCOUNTER — Other Ambulatory Visit: Payer: Medicare Other | Admitting: *Deleted

## 2020-06-03 DIAGNOSIS — IMO0002 Reserved for concepts with insufficient information to code with codable children: Secondary | ICD-10-CM

## 2020-06-03 DIAGNOSIS — I10 Essential (primary) hypertension: Secondary | ICD-10-CM

## 2020-06-03 DIAGNOSIS — F251 Schizoaffective disorder, depressive type: Secondary | ICD-10-CM

## 2020-06-03 DIAGNOSIS — F201 Disorganized schizophrenia: Secondary | ICD-10-CM

## 2020-06-03 DIAGNOSIS — N1831 Chronic kidney disease, stage 3a: Secondary | ICD-10-CM

## 2020-06-03 DIAGNOSIS — E1165 Type 2 diabetes mellitus with hyperglycemia: Secondary | ICD-10-CM
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
## 2020-06-03 NOTE — Patient Outreach (Signed)
error 
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
## 2020-06-04 NOTE — Patient Instructions (Signed)
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 verbalized it was in March  . Collaborated with pcp and CCM team  regarding appointment changes and expressed needs  . Discussed plans with patient for ongoing care management follow up and provided patient with direct contact information for care management team . Care Guide referral for food resources   Patient Goals/Self-Care Activities Over the next 120 days, patient will:  - Patient will self administer medications as prescribed Patient will attend all scheduled provider appointments Patient will call pharmacy for medication refills Patient will continue to perform IADL's independently Patient will call provider office for new concerns or questions Patient will work with BSW to address care coordination needs and will continue to work with the clinical team to address health care and disease management related needs.   - anxiety screen reviewed - depression screen reviewed - participation in psychiatric services encouraged  Follow Up Plan: Telephone follow up appointment with care management team member scheduled for: 07-06-2020 at 11:45 am       Task: RNCM: Identify Depressive  Symptoms and Facilitate Treatment   Note:   Care Management Activities:    - anxiety screen reviewed - depression screen reviewed - participation in psychiatric services encouraged       Patient Care Plan: RNCM: HLD Management    Problem Identified: RNCM: Coping Skills (General Plan of Care)   Priority: Medium    Goal: RNCM: HLD   Priority: Medium  Note:   Current Barriers:  . Poorly controlled hyperlipidemia, complicated by non-compliance, DM, HTN, behavioral health issues  . Current antihyperlipidemic regimen: Atorvastatin 40 mg QD . Most recent lipid panel:     Component Value Date/Time   CHOL 175 02/23/2020 0654   TRIG 275 (H) 02/23/2020 0654   HDL 42 02/23/2020 0654   CHOLHDL 4.2 02/23/2020 0654   VLDL 55 (H) 02/23/2020 0654   LDLCALC 78 02/23/2020 0654   LDLCALC 135 (H) 11/07/2017 0925 .   [name] ASCVD risk enhancing conditions: age 23, DM, HTN . Unable to self administer medications as prescribed . Does not attend all scheduled provider appointments . Does not adhere to prescribed medication regimen . Does not adhere to prescribed psychotropic medication regimen . Lacks social connections . Unable to perform IADLs independently . Does not maintain contact with provider office . Does not contact provider office for questions/concerns  RN Care Manager Clinical Goal(s):  .  Over the next 120 days, patient will work with Consulting civil engineer, providers, and care team towards execution of optimized self-health management plan . Over the next 120 days, patient will verbalize understanding of plan for effective management of HLD . Over the next 120 days, patient will work with [facility] and CCM team  to address needs related to effective management of HLD   Interventions: . Collaboration with [name], DO regarding development and update of comprehensive plan of care as evidenced by provider attestation and co-signature . Inter-disciplinary care team collaboration  (see longitudinal plan of care) . Medication review performed; medication list updated in electronic medical record.  Bertram Savin care team collaboration (see longitudinal plan of care) . Referred to pharmacy team for assistance with HLD medication management . Evaluation of current treatment plan related to HLD and patient's adherence to plan as established by provider. . Advised patient to call the office for changes in condition or new quesitons . Discussed plans with patient for ongoing care management follow up and provided patient with direct contact information for care management team . Advised to take medications as directed, reviewed if the patient has adequate supply of medications.   Patient Goals/Self-Care Activities: . Over the next 120 days, patient will:   - call for medicine refill 2 or 3 days before it runs out - call if I am sick and can't take my medicine - keep a list of all the medicines I take; vitamins and herbals too - learn to read medicine labels - use a pillbox to sort medicine - use an alarm clock or phone to remind me to take my medicine - change to whole grain breads, cereal, pasta - drink 6 to 8 glasses of water each day - eat 5 or 6 small meals each day - fill half the plate with nonstarchy vegetables - limit fast food meals to no more than 1 per week - manage portion size - prepare main meal at home 3 to 5 days each week - read food labels for fat, fiber, carbohydrates and portion size - be open to making changes - I can manage, know and watch for signs of a heart attack - if I have chest pain, call for help - learn about small changes that will make a big difference - learn my personal risk factors  - active listening utilized - counseling provided - current coping strategies identified - decision-making supported - mindfulness encouraged - problem-solving facilitated - self-reflection promoted - spiritual activities promoted -  verbalization of feelings encouraged  Follow Up Plan: Telephone follow up appointment with care management team member scheduled for: 07-06-2020 at 11:45 am     Task: RNCM: HLD Support Psychosocial Response to Risk or Actual Health Condition   Note:   Care Management Activities:    - active listening utilized - counseling provided - current coping strategies identified - decision-making supported - mindfulness encouraged - problem-solving facilitated - self-reflection promoted - spiritual activities promoted - verbalization of feelings encouraged

## 2020-06-08 ENCOUNTER — Ambulatory Visit: Payer: Self-pay | Admitting: General Practice

## 2020-06-08 ENCOUNTER — Other Ambulatory Visit: Payer: Self-pay | Admitting: Family Medicine

## 2020-06-08 DIAGNOSIS — IMO0002 Reserved for concepts with insufficient information to code with codable children: Secondary | ICD-10-CM

## 2020-06-08 DIAGNOSIS — E785 Hyperlipidemia, unspecified: Secondary | ICD-10-CM

## 2020-06-08 DIAGNOSIS — E1165 Type 2 diabetes mellitus with hyperglycemia: Secondary | ICD-10-CM

## 2020-06-08 DIAGNOSIS — N1831 Chronic kidney disease, stage 3a: Secondary | ICD-10-CM

## 2020-06-08 DIAGNOSIS — F251 Schizoaffective disorder, depressive type: Secondary | ICD-10-CM

## 2020-06-08 DIAGNOSIS — I10 Essential (primary) hypertension: Secondary | ICD-10-CM

## 2020-06-08 DIAGNOSIS — E1149 Type 2 diabetes mellitus with other diabetic neurological complication: Secondary | ICD-10-CM

## 2020-06-08 DIAGNOSIS — E1169 Type 2 diabetes mellitus with other specified complication: Secondary | ICD-10-CM

## 2020-06-08 DIAGNOSIS — F201 Disorganized schizophrenia: Secondary | ICD-10-CM

## 2020-06-08 NOTE — Chronic Care Management (AMB) (Signed)
Chronic Care Management   CCM RN Visit Note  06/08/2020 Name: [name] MRN: [id number] DOB: May 02, 1966  Subjective: [name] is a 55 y.o. year old female who is a primary care patient of [name], DO. The care management team was consulted for assistance with disease management and care coordination needs.    Engaged with patient by telephone for follow up visit in response to provider referral for case management and/or care coordination services.   Consent to Services:  The patient was given information about Chronic Care Management services, agreed to services, and gave verbal consent prior to initiation of services.  Please see initial visit note for detailed documentation.   Patient agreed to services and verbal consent obtained.   Assessment: Review of patient past medical history, allergies, medications, health status, including review of consultants reports, laboratory and other test data, was performed as part of comprehensive evaluation and provision of chronic care management services.   SDOH (Social Determinants of Health) assessments and interventions performed:    CCM Care Plan  No Known Allergies  Outpatient Encounter Medications as of 06/08/2020  Medication Sig   acetaminophen (TYLENOL) 325 MG tablet Take 2 tablets (650 mg total) by mouth every 6 (six) hours as needed for mild pain.   ARIPiprazole (ABILIFY) 15 MG tablet Take 1 tablet (15 mg total) by mouth at bedtime.   atorvastatin (LIPITOR) 40 MG tablet Take 1 tablet (40 mg total) by mouth daily.   buPROPion (WELLBUTRIN XL) 300 MG 24 hr tablet TAKE 1 TABLET BY MOUTH EACH MORNING.   colestipol (COLESTID) 1 g tablet TAKE (2) TABLETS BY MOUTH TWICE DAILY.   furosemide (LASIX) 40 MG tablet Take 40 mg by mouth daily.   gabapentin (NEURONTIN) 300 MG capsule Take 1 capsule (300 mg total) by mouth at bedtime.   hydrochlorothiazide (HYDRODIURIL) 12.5 MG tablet Take 1 tablet (12.5  mg total) by mouth daily.   insulin glargine (LANTUS SOLOSTAR) 100 UNIT/ML Solostar Pen Inject 25 Units into the skin at bedtime.   insulin lispro (HUMALOG) 100 UNIT/ML injection Inject 5 Units into the skin 2 (two) times daily with a meal.   Lancets (ONETOUCH DELICA PLUS [id number]) MISC Apply topically.   metFORMIN (GLUCOPHAGE) 1000 MG tablet Take 1 tablet (1,000 mg total) by mouth daily with breakfast.   metoprolol succinate (TOPROL-XL) 50 MG 24 hr tablet Take 1 tablet (50 mg total) by mouth daily. Take with or immediately following a meal.   mupirocin ointment (BACTROBAN) 2 % Apply 1 application topically 2 (two) times daily.   ondansetron (ZOFRAN) 4 MG tablet Take 1 tablet (4 mg total) by mouth every 6 (six) hours.   ONETOUCH VERIO test strip CHECK BLOOD SUGAR 3 TIMES DAILY.   pantoprazole (PROTONIX) 40 MG tablet Take 40 mg by mouth 2 (two) times daily.   No facility-administered encounter medications on file as of 06/08/2020.    Patient Active Problem List   Diagnosis Date Noted   Charcot's joint of foot, left    Slurred speech 05/09/2020   Generalized weakness 03/04/2020   Schizophrenia ([place]) 02/20/2020   Chronic diarrhea 11/25/2019   History of colon cancer 11/25/2019   Heart palpitations 08/19/2019   Iron deficiency anemia 07/25/2019   Diabetes mellitus with polyneuropathy ([place]) 07/17/2019   S/P partial colectomy 07/17/2019   AKI (acute kidney injury) ([place]) 07/17/2019   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[place]) 05/28/2019   Anemia    Hyponatremia 05/23/2019   Neuropathic foot ulcer, left, with  fat layer exposed ([place]) 02/05/2019   Hyperkalemia 02/05/2019   Persistent microalbuminuria associated with type 2 diabetes mellitus ([place]) 05/16/2018   Type 2 diabetes mellitus with hyperglycemia, with long-term current use of insulin ([place]) 05/16/2018   Morbid obesity ([place]) 05/10/2017   Long-term use of high-risk medication 05/10/2017   Hyperlipidemia  associated with type 2 diabetes mellitus ([place]) 06/27/2016   Hammer toe of second toe of right foot 04/20/2016   Essential hypertension 06/29/2015   Schizoaffective disorder ([place]) 06/29/2015   DM (diabetes mellitus), type 2, 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[place]) 37/08/8887   Polyneuropathy 04/02/2014    Conditions to be addressed/monitored:HTN, HLD, DMII and Schizoaffective disorder   Care Plan : RNCM: Diabetes Type 2 (Adult)  Updates made by Vanita Ingles since 06/08/2020 12:00 AM    Problem: RNCM: Glycemic Management (Diabetes, Type 2)   Priority: High    Goal: RNCM: Glycemic Management Optimized   Priority: High  Note:   Objective:  Lab Results  Component Value Date   HGBA1C 10.1 (H) 05/08/2020    Lab Results  Component Value Date   CREATININE 1.09 (H) 05/11/2020   CREATININE 1.34 (H) 05/10/2020   CREATININE 1.95 (H) 05/09/2020     No results found for: EGFR Current Barriers:   Knowledge Deficits related to basic Diabetes pathophysiology and self care/management  Knowledge Deficits related to medications used for management of diabetes  Does not have glucometer to monitor blood sugar- does not have strips for current glucometer. Asking for a new script for meter and supplies  Financial Constraints  Cognitive Deficits  Does not use cbg meter   Limited Social Support  Unable to independently manage DM2  Does not adhere to provider recommendations re: take medications, keep appointments, adhere to prescribed diet  Does not attend all scheduled provider appointments  Does not adhere to prescribed medication regimen  Lacks social connections  Unable to perform IADLs independently  Does not maintain contact with provider office  Does not contact provider office for questions/concerns Case Manager Clinical Goal(s):   Collaboration with [name], DO regarding development and update of comprehensive plan of care as evidenced by  provider attestation and co-signature  Inter-disciplinary care team collaboration (see longitudinal plan of care)  Over the next 120 days, patient will demonstrate improved adherence to prescribed treatment plan for diabetes self care/management as evidenced by:   daily monitoring and recording of CBG   adherence to ADA/ carb modified diet  adherence to prescribed medication regimen Interventions:   Provided education to patient about basic DM disease process  Reviewed medications with patient and discussed importance of medication adherence  Discussed plans with patient for ongoing care management follow up and provided patient with direct contact information for care management team  Provided patient with written educational materials related to hypo and hyperglycemia and importance of correct treatment  Reviewed scheduled/upcoming provider appointments including: Had an appointment of 05-26-2020.  Is attempting to find new pcp closer to [place] where she lives now.   Advised patient, providing education and rationale, to check cbg TID and record, calling pcp for findings outside established parameters.  06-08-2020: The patient states she has misplaced the pen that sticks her finger and she has not checked her blood sugar x 2 days. Advised the patient that she needs to check blood sugars. She is calling [place] to inquire about pen device. Advised to ask pharmacist to send request if she needed a new glucose meter. Patient verbalized understanding.  Referral made to pharmacy team for assistance with getting new glucose meter and supplies. 06-08-2020. The patient has not gotten new meter. Instructions provided today. The patient is calling the pharmacy to inquire about a new meter.   Referral made to community resources care guide team for assistance with resources in [facility] for Meals on wheels or other food delivery surface   Review of patient status, including  review of consultants reports, relevant laboratory and other test results, and medications completed. Patient Goals/Self-Care Activities  Over the next 120 days, patient will:  - UNABLE to independently manage DM effectively  Self administers oral medications as prescribed Self administers insulin as prescribed Attends all scheduled provider appointments Checks blood sugars as prescribed and utilize hyper and hypoglycemia protocol as needed Adheres to prescribed ADA/carb modified - barriers to adherence to treatment plan identified - blood glucose monitoring encouraged- 06-08-2020: patient has not checked in 2 days, cannot find needle stick to stick her finger - individualized medical nutrition therapy provided - resources required to improve adherence to care identified - self-awareness of signs/symptoms of hypo or hyperglycemia encouraged - use of blood glucose monitoring log promoted Follow Up Plan: Telephone follow up appointment with care management team member scheduled for: 07-06-2020 at 11:45 am    Care Plan : RNCM: Hypertension (Adult)  Updates made by Vanita Ingles since 06/08/2020 12:00 AM    Problem: RNCM: Hypertension (Hypertension)   Priority: Medium    Goal: RNCM: Hypertension Monitored   Priority: Medium  Note:   Objective:   Last practice recorded BP readings:  BP Readings from Last 3 Encounters:  05/11/20 (!) 156/74  04/17/20 134/76  03/04/20 (!) 170/80     Most recent eGFR/CrCl: No results found for: EGFR  No components found for: CRCL Current Barriers:   Knowledge Deficits related to basic understanding of hypertension pathophysiology and self care management  Knowledge Deficits related to understanding of medications prescribed for management of hypertension  Non-adherence to prescribed medication regimen  Non-adherence to scheduled provider appointments  Transportation barriers  Cognitive Deficits  Limited Social Lawyer.    Unable to independently management of HTN   Does not adhere to provider recommendations re: dietary restrictions, medications management and lifestyle changes   Does not attend all scheduled provider appointments  Does not adhere to prescribed medication regimen  Lacks social connections  Unable to perform IADLs independently  Does not maintain contact with provider office  Does not contact provider office for questions/concerns Case Manager Clinical Goal(s):   Over the next 120 days, patient will verbalize understanding of plan for hypertension management  Over the next 120 days, patient will attend all scheduled medical appointments: 05-19-2020 with pcp  Over the next 120 days, patient will demonstrate improved adherence to prescribed treatment plan for hypertension as evidenced by taking all medications as prescribed, monitoring and recording blood pressure as directed, adhering to low sodium/DASH diet  Over the next 120 days, patient will demonstrate improved health management independence as evidenced by checking blood pressure as directed and notifying PCP if SBP>160 or DBP > 90, taking all medications as prescribe, and adhering to a low sodium diet as discussed.  Over the next 120 days, patient will verbalize basic understanding of hypertension disease process and self health management plan as evidenced by compliance with medications, diet, and working with CCM team to maintain health and well being.  Interventions:   Collaboration with [name], DO regarding development and update of  comprehensive plan of care as evidenced by provider attestation and co-signature  Inter-disciplinary care team collaboration (see longitudinal plan of care)  Evaluation of current treatment plan related to hypertension self management and patient's adherence to plan as established by provider.  Provided education to patient re: stroke prevention, s/s of heart attack and  stroke, DASH diet, complications of uncontrolled blood pressure  Reviewed medications with patient and discussed importance of compliance  Discussed plans with patient for ongoing care management follow up and provided patient with direct contact information for care management team  Advised patient, providing education and rationale, to monitor blood pressure daily and record, calling PCP for findings outside established parameters.   Reviewed scheduled/upcoming provider appointments including: Saw pcp on 05-26-2020. Is wanting to find a new pcp local in [place] the patient today and provided the patient with 2 provider offices in [place] that are accepting new patients: [facility] [place]. [address], [place], phone: [phone] and [facility] at [phone], [place] in [place]. The patient was going to call these places today to see about getting an appointment.   Reviewed the lab request from pcp and ask the patient to go to [facility] and have labwork done. Gave the patient the [facility] to the [facility] on [address] with phone number. She is going to set up transportation to have blood work done on Friday.  Patient Goals/Self-Care Activities  Over the next 120 days, patient will:  - Self administers medications as prescribed Attends all scheduled provider appointments Calls provider office for new concerns, questions, or BP outside discussed parameters Checks BP and records as discussed Follows a low sodium diet/DASH diet - blood pressure trends reviewed - depression screen reviewed - home or ambulatory blood pressure monitoring encouraged Follow Up Plan: Telephone follow up appointment with care management team member scheduled for: 07-06-2020 at 11:45 am   Care Plan : RNCM: Schizoaffective Disorder and Depression (Adult)  Updates made by Vanita Ingles since 06/08/2020 12:00 AM    Problem: RNCM: Schizoaffective Disorder and  Depression Identification (Depression)   Priority: Medium    Goal: RNCM: Depressive Symptoms Identified   Priority: Medium  Note:   Current Barriers:   Knowledge Deficits related to effective management of behavioral health conditions   Care Coordination needs related to food resources  in a patient with anxiety, depression and schizoaffective disorder  Chronic Disease Management support and education needs related to effective management of depression /anxiety/ and schizoaffective disorder  Lacks caregiver support.   Film/video editor.   Transportation barriers  Non-adherence to scheduled provider appointments  Non-adherence to prescribed medication regimen  Cognitive Deficits  Unable to self administer medications as prescribed  Does not attend all scheduled provider appointments  Does not adhere to prescribed medication regimen  Does not adhere to prescribed psychotropic medication regimen  Lacks social connections  Unable to perform IADLs independently  Does not maintain contact with provider office  Does not contact provider office for questions/concerns  Nurse Case Manager Clinical Goal(s):   Over the next 120 days, patient will verbalize understanding of plan for effective management of anxiety, depression, and schizoaffective disorder   Over the next 120 days, patient will work with [facility] and CCM team  to address needs related to behavioral health management and needs  Over the next 120 days, patient will not experience hospital admission. Hospital Admissions in last 6 months = 6  Over the next 120 days, patient will  demonstrate a decrease in behavioral health exacerbations as evidenced by compliance to the plan of care   Over the next 120 days, patient will demonstrate improved adherence to prescribed treatment plan for depression, anxiety, and schizoaffective disorder  as evidenced byno new hospitalization, independent in ADL and IADLS, and compliance  with plan of care   Over the next 120 days, patient will work with CM team pharmacist to assist with expressed needs for glucometer and medication management   Over the next 120 days, patient will work with care guides  (community agency) to receive recourses for food in [place]  Interventions:   1:1 collaboration with Parks Ranger, Devonne Doughty, DO regarding development and update of comprehensive plan of care as evidenced by provider attestation and co-signature  Inter-disciplinary care team collaboration (see longitudinal plan of care)  Evaluation of current treatment plan related to depression, anxiety and schizoaffective disorder  and patient's adherence to plan as established by provider.  Advised patient to call the office and find out when her appointment with specialist is. She thinks it is sometime in March. The patient to call to set up an appointment with new provider for pcp services   Provided education to patient re: taking medications and maintaining current regimen. Reminded patient of appointment with psychiatrist. The patient verbalized it was in March   Collaborated with pcp and CCM team  regarding appointment changes and expressed needs   Discussed plans with patient for ongoing care management follow up and provided patient with direct contact information for care management team  Care Guide referral for food resources   Review of family circumstances. Her family is still not talking to her. She is supposed to go to court tomorrow. Her sister and daughter are trying to say the patient is incompetent but she says she is doing everything she is supposed to do. She says the transportation is supposed to pick her up at 8 am but they have advised her they may have to cancel her appointment due to pending bad weather. The patient states that she will have to call the court by 830 am and let them know if she does not have transportation to the court system.  06-08-2020.    Patient Goals/Self-Care Activities Over the next 120 days, patient will:  - Patient will self administer medications as prescribed Patient will attend all scheduled provider appointments Patient will call pharmacy for medication refills Patient will continue to perform IADL's independently Patient will call provider office for new concerns or questions Patient will work with BSW to address care coordination needs and will continue to work with the clinical team to address health care and disease management related needs.   - anxiety screen reviewed - depression screen reviewed - participation in psychiatric services encouraged  Follow Up Plan: Telephone follow up appointment with care management team member scheduled for: 07-06-2020 at 11:45 am         Plan:Telephone follow up appointment with care management team member scheduled for:  07-06-2020 at 11:45 am  Noreene Larsson RN, MSN, [place] Mobile: [phone]

## 2020-06-08 NOTE — Patient Instructions (Signed)
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 Evaluation of current treatment plan related to depression, anxiety and schizoaffective disorder  and patient's adherence to plan as established by provider. . Advised patient to call the office and find out when her appointment with specialist is. She thinks it is sometime in March. The patient to call to set up an appointment with new provider for pcp services  . Provided education to patient re: taking medications and maintaining current regimen. Reminded patient of appointment with psychiatrist. The patient verbalized it was in March  . Collaborated with pcp and CCM team  regarding appointment changes and expressed needs  . Discussed plans with patient for ongoing care management follow up and provided patient with direct contact information for care management team . Care Guide referral for food resources  . Review of family circumstances. Her family is still not talking to her. She is supposed to go to court tomorrow. Her sister and daughter are trying to say the patient is incompetent but she says she is doing everything she is supposed to do. She says  the transportation is supposed to pick her up at 8 am but they have advised her they may have to cancel her appointment due to pending bad weather. The patient states that she will have to call the court by 830 am and let them know if she does not have transportation to the court system.  06-08-2020.   Patient Goals/Self-Care Activities Over the next 120 days, patient will:  - Patient will self administer medications as prescribed Patient will attend all scheduled provider appointments Patient will call pharmacy for medication refills Patient will continue to perform IADL's independently Patient will call provider office for new concerns or questions Patient will work with BSW to address care coordination needs and will continue to work with the clinical team to address health care and disease management related needs.   - anxiety screen reviewed - depression screen reviewed - participation in psychiatric services encouraged  Follow Up Plan: Telephone follow up appointment with care management team member scheduled for: 07-06-2020 at 11:45 am       Task: RNCM: Identify Depressive Symptoms and Facilitate Treatment   Note:   Care Management Activities:    - anxiety screen  reviewed - depression screen reviewed - participation in psychiatric services encouraged       Patient Care Plan: RNCM: HLD Management    Problem Identified: RNCM: Coping Skills (General Plan of Care)   Priority: Medium    Goal: RNCM: HLD   Priority: Medium  Note:   Current Barriers:  . Poorly controlled hyperlipidemia, complicated by non-compliance, DM, HTN, behavioral health issues  . Current antihyperlipidemic regimen: Atorvastatin 40 mg QD . Most recent lipid panel:     Component Value Date/Time   CHOL 175 02/23/2020 0654   TRIG 275 (H) 02/23/2020 0654   HDL 42 02/23/2020 0654   CHOLHDL 4.2 02/23/2020 0654   VLDL 55 (H) 02/23/2020 0654   LDLCALC 78 02/23/2020 0654   LDLCALC 135 (H) 11/07/2017 0925  .   [name] ASCVD risk enhancing conditions: age 21, DM, HTN . Unable to self administer medications as prescribed . Does not attend all scheduled provider appointments . Does not adhere to prescribed medication regimen . Does not adhere to prescribed psychotropic medication regimen . Lacks social connections . Unable to perform IADLs independently . Does not maintain contact with provider office . Does not contact provider office for questions/concerns  RN Care Manager Clinical Goal(s):  [name] Over the next 120 days, patient will work with Consulting civil engineer, providers, and care team towards execution of optimized self-health management plan . Over the next 120 days, patient will verbalize understanding of plan for effective management of HLD . Over the next 120 days, patient will work with [facility] and CCM team  to address needs related to effective management of HLD   Interventions: . Collaboration with [name], DO regarding development and update of comprehensive plan of care as evidenced by provider attestation and co-signature . Inter-disciplinary care team collaboration (see longitudinal plan of care) . Medication review performed; medication list updated in electronic medical record.  Bertram Savin care team collaboration (see longitudinal plan of care) . Referred to pharmacy team for assistance with HLD medication management . Evaluation of current treatment plan related to HLD and patient's adherence to plan as established by provider. . Advised patient to call the office for changes in condition or new quesitons . Discussed plans with patient for ongoing care management follow up and provided patient with direct contact information for care management team . Advised to take medications as directed, reviewed if the patient has adequate supply of medications.   Patient Goals/Self-Care Activities: . Over the next 120 days, patient will:   - call for medicine refill 2 or 3  days before it runs out - call if I am sick and can't take my medicine - keep a list of all the medicines I take; vitamins and herbals too - learn to read medicine labels - use a pillbox to sort medicine - use an alarm clock or phone to remind me to take my medicine - change to whole grain breads, cereal, pasta - drink 6 to 8 glasses of water each day - eat 5 or 6 small meals each day - fill half the plate with nonstarchy vegetables - limit fast food meals to no more than 1 per week - manage portion size - prepare main meal at home 3 to 5 days each week - read food labels for fat, fiber, carbohydrates and portion size - be open to making changes - I can manage, know and watch for signs of a heart attack - if I have chest pain, call for help -  learn about small changes that will make a big difference - learn my personal risk factors  - active listening utilized - counseling provided - current coping strategies identified - decision-making supported - mindfulness encouraged - problem-solving facilitated - self-reflection promoted - spiritual activities promoted - verbalization of feelings encouraged  Follow Up Plan: Telephone follow up appointment with care management team member scheduled for: 07-06-2020 at 11:45 am     Task: RNCM: HLD Support Psychosocial Response to Risk or Actual Health Condition   Note:   Care Management Activities:    - active listening utilized - counseling provided - current coping strategies identified - decision-making supported - mindfulness encouraged - problem-solving facilitated - self-reflection promoted - spiritual activities promoted - verbalization of feelings encouraged        Patient verbalizes understanding of instructions provided today and agrees to view in [place].   Telephone follow up appointment with care management team member scheduled for: 07-06-2020 at 11:45 am  Noreene Larsson RN, MSN, [facility] Mobile: [phone]

## 2020-06-09 ENCOUNTER — Other Ambulatory Visit: Payer: Self-pay | Admitting: Family Medicine

## 2020-06-09 NOTE — Telephone Encounter (Signed)
Requested medication (s) are due for refill today: Yes  Requested medication (s) are on the active medication list: Yes  Last refill:  05/15/20  Future visit scheduled: No  Notes to clinic:  Unable to refill per protocol, last refill by another provider.      Requested Prescriptions  Pending Prescriptions Disp Refills   Lancets (ONETOUCH DELICA PLUS [id number]) [place] [Pharmacy Med Name: ONE TOUCH PLUS DELICA LANCETS] 174 each 0    Sig: USE TO TEST 3 TIMES DAILY.      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

## 2020-06-10 ENCOUNTER — Ambulatory Visit: Payer: Medicare HMO | Admitting: Licensed Clinical Social Worker

## 2020-06-10 DIAGNOSIS — E1140 Type 2 diabetes mellitus with diabetic neuropathy, unspecified: Secondary | ICD-10-CM | POA: Diagnosis not present

## 2020-06-10 DIAGNOSIS — E1165 Type 2 diabetes mellitus with hyperglycemia: Secondary | ICD-10-CM | POA: Diagnosis not present

## 2020-06-10 DIAGNOSIS — N1831 Chronic kidney disease, stage 3a: Secondary | ICD-10-CM

## 2020-06-10 DIAGNOSIS — G709 Myoneural disorder, unspecified: Secondary | ICD-10-CM | POA: Diagnosis not present

## 2020-06-10 DIAGNOSIS — I10 Essential (primary) hypertension: Secondary | ICD-10-CM

## 2020-06-10 DIAGNOSIS — F209 Schizophrenia, unspecified: Secondary | ICD-10-CM | POA: Diagnosis not present

## 2020-06-10 DIAGNOSIS — E11610 Type 2 diabetes mellitus with diabetic neuropathic arthropathy: Secondary | ICD-10-CM | POA: Diagnosis not present

## 2020-06-10 DIAGNOSIS — E785 Hyperlipidemia, unspecified: Secondary | ICD-10-CM | POA: Diagnosis not present

## 2020-06-10 DIAGNOSIS — F251 Schizoaffective disorder, depressive type: Secondary | ICD-10-CM

## 2020-06-10 DIAGNOSIS — E1169 Type 2 diabetes mellitus with other specified complication: Secondary | ICD-10-CM | POA: Diagnosis not present

## 2020-06-10 DIAGNOSIS — E1121 Type 2 diabetes mellitus with diabetic nephropathy: Secondary | ICD-10-CM | POA: Diagnosis not present

## 2020-06-10 DIAGNOSIS — IMO0002 Reserved for concepts with insufficient information to code with codable children: Secondary | ICD-10-CM

## 2020-06-10 DIAGNOSIS — F32A Depression, unspecified: Secondary | ICD-10-CM | POA: Diagnosis not present

## 2020-06-10 DIAGNOSIS — F201 Disorganized schizophrenia: Secondary | ICD-10-CM

## 2020-06-10 DIAGNOSIS — E1149 Type 2 diabetes mellitus with other diabetic neurological complication: Secondary | ICD-10-CM | POA: Diagnosis not present
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
## 2020-06-15 ENCOUNTER — Ambulatory Visit: Payer: Self-pay | Admitting: *Deleted

## 2020-06-15 DIAGNOSIS — I10 Essential (primary) hypertension: Secondary | ICD-10-CM | POA: Diagnosis not present

## 2020-06-15 DIAGNOSIS — E1121 Type 2 diabetes mellitus with diabetic nephropathy: Secondary | ICD-10-CM | POA: Diagnosis not present

## 2020-06-15 DIAGNOSIS — F32A Depression, unspecified: Secondary | ICD-10-CM | POA: Diagnosis not present

## 2020-06-15 DIAGNOSIS — E1165 Type 2 diabetes mellitus with hyperglycemia: Secondary | ICD-10-CM | POA: Diagnosis not present

## 2020-06-15 DIAGNOSIS — E785 Hyperlipidemia, unspecified: Secondary | ICD-10-CM | POA: Diagnosis not present

## 2020-06-15 DIAGNOSIS — F209 Schizophrenia, unspecified: Secondary | ICD-10-CM | POA: Diagnosis not present

## 2020-06-15 DIAGNOSIS — E1140 Type 2 diabetes mellitus with diabetic neuropathy, unspecified: Secondary | ICD-10-CM | POA: Diagnosis not present

## 2020-06-15 DIAGNOSIS — E11610 Type 2 diabetes mellitus with diabetic neuropathic arthropathy: Secondary | ICD-10-CM | POA: Diagnosis not present

## 2020-06-15 DIAGNOSIS — G709 Myoneural disorder, unspecified: Secondary | ICD-10-CM | POA: Diagnosis not present

## 2020-06-15 NOTE — Telephone Encounter (Signed)
Verbal given for a home health nurse to go out and evaluate a pressure ulcer on pt Left foot, collect U/A & culture, glucometer teaching and medication management.

## 2020-06-15 NOTE — Telephone Encounter (Signed)
Physical therapist assistant Anette called in from [place] during her visit with the pt in her home.  Pt's BP is elevated today.   170/102 left arm and 160/100 in right arm.  No symptoms except some nasal head congestion and a headache.   Coughing up white mucus occasionally.   No fever.   She is around others in the apartment community so there is a possibility of covid exposure.  She is taking her metoprolol daily.  She is also c/o burning with urination.  She got a new glucose meter but does not know how to use it so she has not checked her glucose in a while per pt in the background.  She also has a boot on her left leg that has been refitted due to it rubbing her leg and making a reddened area.   Anette wanted to be sure Dr. Parks Ranger was aware that the reddened area on the medial aspect of her left foot is still red from the boot.   "It looks a little more red".  Anette is requesting a verbal order for a home health nurse to come out and see pt and get a urine sample too and assess the reddened area on her left foot.    Also needs to learn how to use her glucose meter.  Anette can be reached at [phone].  I sent my notes to [facility] for Dr. Parks Ranger.        Reason for Disposition . Systolic BP  >= 092 OR Diastolic >= 330  Answer Assessment - Initial Assessment Questions 1. BLOOD PRESSURE: "What is the blood pressure?" "Did you take at least two measurements 5 minutes apart?"     170/102 left arm.  VS fine Also burning with urination.  She is having congestion and a headache.   No fever.  Coughing white mucus.  Always has diarrhea. 2. ONSET: "When did you take your blood pressure?"     10 minute ago BP taken 3. HOW: "How did you obtain the blood pressure?" (e.g., visiting nurse, automatic home BP monitor)     Upper arm BP machine 4. HISTORY: "Do you have a history of high blood pressure?"     Yes   5. MEDICATIONS: "Are you taking any  medications for blood pressure?" "Have you missed any doses recently?"     Takes a water pill.  metoprolol 6. OTHER SYMPTOMS: "Do you have any symptoms?" (e.g., headache, chest pain, blurred vision, difficulty breathing, weakness)     Headache, head congestion.  3 days ago real weak but not today.   She lives in an apartment complex.    She goes out places.   7. PREGNANCY: "Is there any chance you are pregnant?" "When was your last menstrual period?"     N/A  Protocols used: BLOOD PRESSURE - HIGH-A-AH

## 2020-06-15 NOTE — Telephone Encounter (Signed)
Okay to send verbal order for Home Health to assess  [name], [facility] 06/15/2020, 2:13 PM

## 2020-06-16 ENCOUNTER — Other Ambulatory Visit: Payer: Self-pay

## 2020-06-16 DIAGNOSIS — E1142 Type 2 diabetes mellitus with diabetic polyneuropathy: Secondary | ICD-10-CM

## 2020-06-16 DIAGNOSIS — E1169 Type 2 diabetes mellitus with other specified complication: Secondary | ICD-10-CM

## 2020-06-16 MED ORDER — FUROSEMIDE 40 MG PO TABS: 40.0000 mg | ORAL_TABLET | Freq: Every day | ORAL | 2 refills | Status: DC

## 2020-06-16 MED ORDER — GABAPENTIN 300 MG PO CAPS: 300.0000 mg | ORAL_CAPSULE | Freq: Every day | ORAL | 1 refills | Status: DC

## 2020-06-16 MED ORDER — ATORVASTATIN CALCIUM 40 MG PO TABS: 40.0000 mg | ORAL_TABLET | Freq: Every day | ORAL | 1 refills | Status: DC

## 2020-06-18 ENCOUNTER — Telehealth: Payer: Self-pay | Admitting: Family Medicine

## 2020-06-18 DIAGNOSIS — E1142 Type 2 diabetes mellitus with diabetic polyneuropathy: Secondary | ICD-10-CM

## 2020-06-18 DIAGNOSIS — E11610 Type 2 diabetes mellitus with diabetic neuropathic arthropathy: Secondary | ICD-10-CM | POA: Diagnosis not present

## 2020-06-18 DIAGNOSIS — F32A Depression, unspecified: Secondary | ICD-10-CM | POA: Diagnosis not present

## 2020-06-18 DIAGNOSIS — F209 Schizophrenia, unspecified: Secondary | ICD-10-CM | POA: Diagnosis not present

## 2020-06-18 DIAGNOSIS — E1140 Type 2 diabetes mellitus with diabetic neuropathy, unspecified: Secondary | ICD-10-CM | POA: Diagnosis not present

## 2020-06-18 DIAGNOSIS — I10 Essential (primary) hypertension: Secondary | ICD-10-CM

## 2020-06-18 DIAGNOSIS — E1169 Type 2 diabetes mellitus with other specified complication: Secondary | ICD-10-CM

## 2020-06-18 DIAGNOSIS — E1149 Type 2 diabetes mellitus with other diabetic neurological complication: Secondary | ICD-10-CM

## 2020-06-18 DIAGNOSIS — E785 Hyperlipidemia, unspecified: Secondary | ICD-10-CM | POA: Diagnosis not present

## 2020-06-18 DIAGNOSIS — G709 Myoneural disorder, unspecified: Secondary | ICD-10-CM | POA: Diagnosis not present

## 2020-06-18 DIAGNOSIS — E1165 Type 2 diabetes mellitus with hyperglycemia: Secondary | ICD-10-CM | POA: Diagnosis not present

## 2020-06-18 DIAGNOSIS — E1121 Type 2 diabetes mellitus with diabetic nephropathy: Secondary | ICD-10-CM | POA: Diagnosis not present

## 2020-06-18 DIAGNOSIS — IMO0002 Reserved for concepts with insufficient information to code with codable children: Secondary | ICD-10-CM

## 2020-06-18 DIAGNOSIS — F251 Schizoaffective disorder, depressive type: Secondary | ICD-10-CM

## 2020-06-18 NOTE — Telephone Encounter (Signed)
[name]: Pt would like you to call her and go over all her drs and advise her on what she should do. Pt states she is trying to get back on track

## 2020-06-18 NOTE — Telephone Encounter (Signed)
Medication: ARIPiprazole (ABILIFY) 15 MG tablet atorvastatin (LIPITOR) 40 MG tablet buPROPion (WELLBUTRIN XL) 300 MG 24 hr tablet colestipol (COLESTID) 1 g tablet furosemide (LASIX) 40 MG tablet gabapentin (NEURONTIN) 300 MG capsule insulin glargine (LANTUS SOLOSTAR) 100 UNIT/ML Solostar Pen traZODone (DESYREL) 50 MG tablet  pantoprazole (PROTONIX) 40 MG tablet  Also pt does not see a specialist anymore and would like Dr Raliegh Ip to take over  metFORMIN (GLUCOPHAGE) 1000 MG tablet metoprolol succinate (TOPROL-XL) 50 MG 24 hr tablet Has the pt contacted their pharmacy? Yes but has not used mail order before for these meds  Preferred pharmacy: [facility], [place]   Pt's med are at [facility], but pt is requesting all her meds be sent to [facility] mail order, 90 day supply

## 2020-06-18 NOTE — Telephone Encounter (Signed)
Copied from [place] [id number]. Topic: General - Other >> Jun 18, 2020  3:45 PM Tessa Lerner A wrote: [name] from [facility] has called on behalf of patient requesting an order for a Urinalysis   Patient has been experiencing urinary issues for roughly a week

## 2020-06-18 NOTE — Telephone Encounter (Signed)
Notes to clinic:   Also pt does not see a specialist anymore and would like Dr Raliegh Ip to take over Patient has a 90 day supply at local pharmacy but would like all meds sent to [facility]    Requested Prescriptions  Pending Prescriptions Disp Refills   traZODone (DESYREL) 50 MG tablet 30 tablet 0    Sig: Take 1 tablet (50 mg total) by mouth at bedtime as needed. for sleep      Psychiatry: Antidepressants - Serotonin Modulator Passed - 06/18/2020 10:11 AM      Passed - Valid encounter within last 6 months    Recent Outpatient Visits           3 weeks ago Dehydration   [facility] [place], Devonne Doughty, DO   2 months ago Schizoaffective disorder, depressive type [facility])   [facility] [name], DO   11 months ago Adenocarcinoma of sigmoid colon [facility])   [facility] [name], DO   11 months ago Essential hypertension   [place], DO   1 year ago Essential hypertension   [place], DO       Future Appointments             In 6 days Lindell Spar, MD [facility], [facility]               pantoprazole (PROTONIX) 40 MG tablet      Sig: Take 1 tablet (40 mg total) by mouth 2 (two) times daily.      Gastroenterology: Proton Pump Inhibitors Passed - 06/18/2020 10:11 AM      Passed - Valid encounter within last 12 months    Recent Outpatient Visits           3 weeks ago Dehydration   [facility] [place], Devonne Doughty, DO   2 months ago Schizoaffective disorder, depressive type [facility])   [facility] [name], DO   11 months ago Adenocarcinoma of sigmoid colon [facility])   [facility] [name], DO   11 months ago Essential hypertension   [facility] [name], DO   1 year ago Essential hypertension    [facility], Devonne Doughty, DO       Future Appointments             In 6 days Lindell Spar, MD [facility] Primary [facility], [facility]               ARIPiprazole (ABILIFY) 15 MG tablet 30 tablet 0    Sig: Take 1 tablet (15 mg total) by mouth daily.      Not Delegated - Psychiatry:  Antipsychotics - Second Generation (Atypical) - aripiprazole Failed - 06/18/2020 10:11 AM      Failed - This refill cannot be delegated      Passed - Valid encounter within last 6 months    Recent Outpatient Visits           3 weeks ago Dehydration   [facility] [place], Devonne Doughty, DO   2 months ago Schizoaffective disorder, depressive type [facility])   [place], DO   11 months ago Adenocarcinoma of sigmoid colon [facility])   [facility] [name], DO   11 months ago Essential hypertension  [facility] [place], Devonne Doughty, DO   1 year ago Essential hypertension   [place], DO       Future Appointments             In 6 days Lindell Spar, MD [facility] Primary Care, RPC               atorvastatin (LIPITOR) 40 MG tablet 30 tablet 1    Sig: Take 1 tablet (40 mg total) by mouth daily.      Cardiovascular:  Antilipid - Statins Failed - 06/18/2020 10:11 AM      Failed - Triglycerides in normal range and within 360 days    Triglycerides  Date Value Ref Range Status  02/23/2020 275 (H) <150 mg/dL Final          Passed - Total Cholesterol in normal range and within 360 days    Cholesterol  Date Value Ref Range Status  02/23/2020 175 0 - 200 mg/dL Final          Passed - LDL in normal range and within 360 days    LDL Cholesterol (Calc)  Date Value Ref Range Status  11/07/2017 135 (H) mg/dL (calc) Final    Comment:    Reference range: <100 . Desirable range <100 mg/dL for primary prevention;   <70 mg/dL  for patients with CHD or diabetic patients  with > or = 2 CHD risk factors. [name] LDL-C is now calculated using the Martin-Hopkins  calculation, which is a validated novel method providing  better accuracy than the Friedewald equation in the  estimation of LDL-C.  Cresenciano Genre et al. Annamaria Helling. 1696;789(38): 2061-2068  (http://education.[employer].com/faq/FAQ164)    LDL Cholesterol  Date Value Ref Range Status  02/23/2020 78 0 - 99 mg/dL Final    Comment:           Total Cholesterol/HDL:CHD Risk Coronary Heart Disease Risk Table                     Men   Women  1/2 Average Risk   3.4   3.3  Average Risk       5.0   4.4  2 X Average Risk   9.6   7.1  3 X Average Risk  23.4   11.0        Use the calculated Patient Ratio above and the CHD Risk Table to determine the patient's CHD Risk.        ATP III CLASSIFICATION (LDL):  <100     mg/dL   Optimal  100-129  mg/dL   Near or Above                    Optimal  130-159  mg/dL   Borderline  160-189  mg/dL   High  >190     mg/dL   Very High Performed at [facility], [address] [place], [place] [address]           Passed - HDL in normal range and within 360 days    HDL  Date Value Ref Range Status  02/23/2020 42 >40 mg/dL Final          Passed - Patient is not pregnant      Passed - Valid encounter within last 12 months    Recent Outpatient Visits           3 weeks ago Dehydration   [place], DO   2 months ago Schizoaffective disorder, depressive type [facility])   [facility] [name], DO   11 months ago Adenocarcinoma of sigmoid colon [facility])   [facility] [name], DO   11 months ago Essential hypertension   [facility] [name], DO   1 year ago Essential hypertension   [facility] [name], DO       Future Appointments             In  6 days Lindell Spar, MD [facility], R[facility]               buPROPion (WELLBUTRIN XL) 300 MG 24 hr tablet 30 tablet 0    Sig: TAKE 1 TABLET BY MOUTH EACH MORNING.      Psychiatry: Antidepressants - bupropion Passed - 06/18/2020 10:11 AM      Passed - Last BP in normal range    BP Readings from Last 1 Encounters:  05/26/20 132/75          Passed - Valid encounter within last 6 months    Recent Outpatient Visits           3 weeks ago Dehydration   [facility] [place], Devonne Doughty, DO   2 months ago Schizoaffective disorder, depressive type [facility])   [facility] [name], DO   11 months ago Adenocarcinoma of sigmoid colon [facility])   [facility] [name], DO   11 months ago Essential hypertension   [facility] [name], DO   1 year ago Essential hypertension   [facility] [place], Devonne Doughty, DO       Future Appointments             In 6 days Lindell Spar, MD [facility], R[facility]               colestipol (COLESTID) 1 g tablet 120 tablet 0    Sig: TAKE (2) TABLETS BY MOUTH TWICE DAILY.      Cardiovascular:  Antilipid - Bile Acid Sequestrants Failed - 06/18/2020 10:11 AM      Failed - Triglycerides in normal range and within 360 days    Triglycerides  Date Value Ref Range Status  02/23/2020 275 (H) <150 mg/dL Final          Passed - Total Cholesterol in normal range and within 360 days    Cholesterol  Date Value Ref Range Status  02/23/2020 175 0 - 200 mg/dL Final          Passed - LDL in normal range and within 360 days    LDL Cholesterol (Calc)  Date Value Ref Range Status  11/07/2017 135 (H) mg/dL (calc) Final    Comment:    Reference range: <100 . Desirable range <100 mg/dL for primary prevention;   <70 mg/dL for patients with CHD or diabetic patients  with > or = 2 CHD risk factors. [name] LDL-C is  now calculated using the Martin-Hopkins  calculation, which is a validated novel method providing  better accuracy than the Friedewald equation in the  estimation of LDL-C.  Cresenciano Genre et al. Annamaria Helling. 2706;237(62): 2061-2068  (http://education.[facility].com/faq/FAQ164)    LDL Cholesterol  Date Value Ref Range Status  02/23/2020 78 0 - 99 mg/dL Final    Comment:  Total Cholesterol/HDL:CHD Risk Coronary Heart Disease Risk Table                     Men   Women  1/2 Average Risk   3.4   3.3  Average Risk       5.0   4.4  2 X Average Risk   9.6   7.1  3 X Average Risk  23.4   11.0        Use the calculated Patient Ratio above and the CHD Risk Table to determine the patient's CHD Risk.        ATP III CLASSIFICATION (LDL):  <100     mg/dL   Optimal  100-129  mg/dL   Near or Above                    Optimal  130-159  mg/dL   Borderline  160-189  mg/dL   High  >190     mg/dL   Very High Performed at [facility], [address] [place], [place] [address]           Passed - HDL in normal range and within 360 days    HDL  Date Value Ref Range Status  02/23/2020 42 >40 mg/dL Final          Passed - Valid encounter within last 12 months    Recent Outpatient Visits           3 weeks ago Dehydration   [facility] [place], Devonne Doughty, DO   2 months ago Schizoaffective disorder, depressive type [facility])   [facility] [name], DO   11 months ago Adenocarcinoma of sigmoid colon [facility])   [facility] [name], DO   11 months ago Essential hypertension   [place], DO   1 year ago Essential hypertension   [place], DO       Future Appointments             In 6 days Lindell Spar, MD [facility] Care, R[facility]               furosemide (LASIX) 40 MG tablet 30 tablet 2    Sig:  Take 1 tablet (40 mg total) by mouth daily.      Cardiovascular:  Diuretics - Loop Failed - 06/18/2020 10:11 AM      Failed - Cr in normal range and within 360 days    Creat  Date Value Ref Range Status  11/25/2019 1.30 (H) 0.50 - 1.05 mg/dL Final    Comment:    For patients >23 years of age, the reference limit for Creatinine is approximately 13% higher for people identified as African-American. .    Creatinine, Ser  Date Value Ref Range Status  05/11/2020 1.09 (H) 0.44 - 1.00 mg/dL Final          Passed - K in normal range and within 360 days    Potassium  Date Value Ref Range Status  05/11/2020 4.6 3.5 - 5.1 mmol/L Final  08/19/2012 3.1 (L) 3.5 - 5.1 mmol/L Final          Passed - Ca in normal range and within 360 days    Calcium  Date Value Ref Range Status  05/11/2020 9.0 8.9 - 10.3 mg/dL Final   Calcium, Total  Date Value Ref Range  Status  08/19/2012 8.9 8.5 - 10.1 mg/dL Final          Passed - Na in normal range and within 360 days    Sodium  Date Value Ref Range Status  05/11/2020 137 135 - 145 mmol/L Final  08/19/2012 133 (L) 136 - 145 mmol/L Final          Passed - Last BP in normal range    BP Readings from Last 1 Encounters:  05/26/20 132/75          Passed - Valid encounter within last 6 months    Recent Outpatient Visits           3 weeks ago Dehydration   [facility] [place], Devonne Doughty, DO   2 months ago Schizoaffective disorder, depressive type [facility])   [name], DO   11 months ago Adenocarcinoma of sigmoid colon [facility])   [facility] [name], DO   11 months ago Essential hypertension   [facility] [name], DO   1 year ago Essential hypertension   [place], DO       Future Appointments             In 6 days Lindell Spar, MD [facility] Primary Care, RPC                gabapentin (NEURONTIN) 300 MG capsule 30 capsule 1    Sig: Take 1 capsule (300 mg total) by mouth at bedtime.      Neurology: Anticonvulsants - gabapentin Passed - 06/18/2020 10:11 AM      Passed - Valid encounter within last 12 months    Recent Outpatient Visits           3 weeks ago Dehydration   [facility] [place], Devonne Doughty, DO   2 months ago Schizoaffective disorder, depressive type [facility])   [facility] [name], DO   11 months ago Adenocarcinoma of sigmoid colon [facility])   [facility] [name], DO   11 months ago Essential hypertension   [facility] [name], DO   1 year ago Essential hypertension   [facility] [place], Devonne Doughty, DO       Future Appointments             In 6 days Lindell Spar, MD [facility] Primary Care, RPC               insulin glargine (LANTUS SOLOSTAR) 100 UNIT/ML Solostar Pen 15 mL     Sig: Inject 25 Units into the skin at bedtime.      Endocrinology:  Diabetes - Insulins Failed - 06/18/2020 10:11 AM      Failed - HBA1C is between 0 and 7.9 and within 180 days    Hemoglobin A1C  Date Value Ref Range Status  03/28/2018 11.9  Final    Comment:    [facility]   Hgb A1c MFr Bld  Date Value Ref Range Status  05/08/2020 10.1 (H) 4.8 - 5.6 % Final    Comment:    (NOTE) Pre diabetes:          5.7%-6.4%  Diabetes:              >6.4%  Glycemic control for   <7.0% adults with diabetes  Passed - Valid encounter within last 6 months    Recent Outpatient Visits           3 weeks ago Dehydration   [facility] [place], Devonne Doughty, DO   2 months ago Schizoaffective disorder, depressive type [facility])   [facility] [name], DO   11 months ago Adenocarcinoma of sigmoid colon [facility])   [facility]  [name], DO   11 months ago Essential hypertension   [facility] [name], DO   1 year ago Essential hypertension   [facility] [place], Devonne Doughty, DO       Future Appointments             In 6 days Lindell Spar, MD [facility] Care, [facility]               hydrochlorothiazide (HYDRODIURIL) 12.5 MG tablet 30 tablet 1    Sig: Take 1 tablet (12.5 mg total) by mouth daily.      Cardiovascular: Diuretics - Thiazide Failed - 06/18/2020 10:11 AM      Failed - Cr in normal range and within 360 days    Creat  Date Value Ref Range Status  11/25/2019 1.30 (H) 0.50 - 1.05 mg/dL Final    Comment:    For patients >15 years of age, the reference limit for Creatinine is approximately 13% higher for people identified as African-American. .    Creatinine, Ser  Date Value Ref Range Status  05/11/2020 1.09 (H) 0.44 - 1.00 mg/dL Final          Passed - Ca in normal range and within 360 days    Calcium  Date Value Ref Range Status  05/11/2020 9.0 8.9 - 10.3 mg/dL Final   Calcium, Total  Date Value Ref Range Status  08/19/2012 8.9 8.5 - 10.1 mg/dL Final          Passed - K in normal range and within 360 days    Potassium  Date Value Ref Range Status  05/11/2020 4.6 3.5 - 5.1 mmol/L Final  08/19/2012 3.1 (L) 3.5 - 5.1 mmol/L Final          Passed - Na in normal range and within 360 days    Sodium  Date Value Ref Range Status  05/11/2020 137 135 - 145 mmol/L Final  08/19/2012 133 (L) 136 - 145 mmol/L Final          Passed - Last BP in normal range    BP Readings from Last 1 Encounters:  05/26/20 132/75          Passed - Valid encounter within last 6 months    Recent Outpatient Visits           3 weeks ago Dehydration   [place], DO   2 months ago Schizoaffective disorder, depressive type [facility])   [facility] [name], DO   11 months ago Adenocarcinoma of sigmoid colon [facility])   [facility] [name], DO   11 months ago Essential hypertension   [facility] [name], DO   1 year ago Essential hypertension   [facility] [name], DO       Future Appointments             In 6 days Lindell Spar, MD [facility]  Primary Care, RPC               metFORMIN (GLUCOPHAGE) 1000 MG tablet 30 tablet 1    Sig: Take 1 tablet (1,000 mg total) by mouth daily with breakfast.      Endocrinology:  Diabetes - Biguanides Failed - 06/18/2020 10:11 AM      Failed - Cr in normal range and within 360 days    Creat  Date Value Ref Range Status  11/25/2019 1.30 (H) 0.50 - 1.05 mg/dL Final    Comment:    For patients >24 years of age, the reference limit for Creatinine is approximately 13% higher for people identified as African-American. .    Creatinine, Ser  Date Value Ref Range Status  05/11/2020 1.09 (H) 0.44 - 1.00 mg/dL Final          Failed - HBA1C is between 0 and 7.9 and within 180 days    Hemoglobin A1C  Date Value Ref Range Status  03/28/2018 11.9  Final    Comment:    [facility]   Hgb A1c MFr Bld  Date Value Ref Range Status  05/08/2020 10.1 (H) 4.8 - 5.6 % Final    Comment:    (NOTE) Pre diabetes:          5.7%-6.4%  Diabetes:              >6.4%  Glycemic control for   <7.0% adults with diabetes           Failed - eGFR in normal range and within 360 days    GFR, Est African American  Date Value Ref Range Status  07/24/2019 60 > OR = 60 mL/min/1.66m Final   GFR calc Af Amer  Date Value Ref Range Status  12/05/2019 46 (L) >60 mL/min Final   GFR, Est Non African American  Date Value Ref Range Status  07/24/2019 52 (L) > OR = 60 mL/min/1.75mFinal   GFR, Estimated  Date Value Ref Range Status  05/11/2020 60 (L) >60 mL/min Final    Comment:     (NOTE) Calculated using the CKD-EPI Creatinine Equation (2021)           Passed - Valid encounter within last 6 months    Recent Outpatient Visits           3 weeks ago Dehydration   [facility]   2 months ago Sch[facility]oaffective disorder, depressive [facility]   11 months ago 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   11 months ago Essential hypertension   [facility]   1 year ago Essential hypertension   [place]       Future Appointments             In 6 days PaLindell SparMD [facility]

## 2020-06-18 NOTE — Telephone Encounter (Signed)
Patient is scheduled to see new PCP in [place] on 06/24/20.   Will decline refill request today and patient will be transferring care to [facility] which is near her home.  She should be encouraged to get all refills from new PCP at her upcoming apt.  She has been recommended to transfer care from our office due to location / transportation and adherence issues in past.  [name], [facility] 06/18/2020, 3:37 PM

## 2020-06-18 NOTE — Telephone Encounter (Signed)
That is fine. They can draw Urinalysis.  FYI - I have posted on prior communication regarding this patient today.  She is transferring her medical care to new PCP in [place] on 06/24/20.  If need we can route the Urinalysis results depending on when they arrive. Otherwise, if she prefers to follow up with them for urine testing that is fine too  [name], [facility] 06/18/2020, 6:09 PM

## 2020-06-19 DIAGNOSIS — F209 Schizophrenia, unspecified: Secondary | ICD-10-CM | POA: Diagnosis not present

## 2020-06-19 DIAGNOSIS — E1140 Type 2 diabetes mellitus with diabetic neuropathy, unspecified: Secondary | ICD-10-CM | POA: Diagnosis not present

## 2020-06-19 DIAGNOSIS — F32A Depression, unspecified: Secondary | ICD-10-CM | POA: Diagnosis not present

## 2020-06-19 DIAGNOSIS — E1121 Type 2 diabetes mellitus with diabetic nephropathy: Secondary | ICD-10-CM | POA: Diagnosis not present

## 2020-06-19 DIAGNOSIS — E785 Hyperlipidemia, unspecified: Secondary | ICD-10-CM | POA: Diagnosis not present

## 2020-06-19 DIAGNOSIS — I10 Essential (primary) hypertension: Secondary | ICD-10-CM | POA: Diagnosis not present

## 2020-06-19 DIAGNOSIS — E11610 Type 2 diabetes mellitus with diabetic neuropathic arthropathy: Secondary | ICD-10-CM | POA: Diagnosis not present

## 2020-06-19 DIAGNOSIS — G709 Myoneural disorder, unspecified: Secondary | ICD-10-CM | POA: Diagnosis not present

## 2020-06-19 DIAGNOSIS — E1165 Type 2 diabetes mellitus with hyperglycemia: Secondary | ICD-10-CM | POA: Diagnosis not present

## 2020-06-19 DIAGNOSIS — N390 Urinary tract infection, site not specified: Secondary | ICD-10-CM | POA: Diagnosis not present

## 2020-06-19 NOTE — Telephone Encounter (Signed)
I called and spoke with Cassandra, RN and gave her verbal order per Dr. Raliegh Ip to collect a urinalysis. I informed Cassandra that a verbal order was given to [place], PT on 06/15/20 for the nurse to go out and evaluate a pressure ulcer, collect urine sample and to teach the patient how to use her glucometer. Cassandra stated she never received that order, but will reach out to [place].

## 2020-06-19 NOTE — Telephone Encounter (Signed)
[employer] called requesting that pt's medications be sent to them  Please call [employer] at [phone].

## 2020-06-23 ENCOUNTER — Ambulatory Visit: Payer: Self-pay | Admitting: Licensed Clinical Social Worker

## 2020-06-23 NOTE — Chronic Care Management (AMB) (Signed)
  Care Management   Follow Up Note   06/23/2020 Name: [name] MRN: [id number] DOB: 1965-10-18  Referred by: [name], DO Reason for referral : Care Coordination   [name] is a 55 y.o. year old female who is a primary care patient of [name], DO. The care management team was consulted for assistance with care management and care coordination needs.    Review of patient status, including review of consultants reports, relevant laboratory and other test results, and collaboration with appropriate care team members and the patient's provider was performed as part of comprehensive patient evaluation and provision of chronic care management services.    Patient is transitioning to a new PCP office and will see her new PCP Dr. Posey Pronto tomorrow on 06/24/20. The CCM team is also at this practice and will continue follow up with patient. CCM LCSW will close current goals and take self off care team due to practice transition.   [name], BSW, MSW, [email or web address] Phone: [phone]

## 2020-06-24 ENCOUNTER — Telehealth: Payer: Self-pay

## 2020-06-24 ENCOUNTER — Other Ambulatory Visit: Payer: Self-pay

## 2020-06-24 ENCOUNTER — Ambulatory Visit (INDEPENDENT_AMBULATORY_CARE_PROVIDER_SITE_OTHER): Payer: Medicare HMO | Admitting: Internal Medicine

## 2020-06-24 ENCOUNTER — Encounter: Payer: Self-pay | Admitting: Internal Medicine

## 2020-06-24 VITALS — BP 127/75 | HR 90 | Resp 18 | Ht 70.5 in | Wt 243.1 lb

## 2020-06-24 DIAGNOSIS — E1149 Type 2 diabetes mellitus with other diabetic neurological complication: Secondary | ICD-10-CM

## 2020-06-24 DIAGNOSIS — G709 Myoneural disorder, unspecified: Secondary | ICD-10-CM | POA: Diagnosis not present

## 2020-06-24 DIAGNOSIS — E1165 Type 2 diabetes mellitus with hyperglycemia: Secondary | ICD-10-CM | POA: Diagnosis not present

## 2020-06-24 DIAGNOSIS — F251 Schizoaffective disorder, depressive type: Secondary | ICD-10-CM | POA: Diagnosis not present

## 2020-06-24 DIAGNOSIS — Z7689 Persons encountering health services in other specified circumstances: Secondary | ICD-10-CM

## 2020-06-24 DIAGNOSIS — Z9049 Acquired absence of other specified parts of digestive tract: Secondary | ICD-10-CM

## 2020-06-24 DIAGNOSIS — M14672 Charcot's joint, left ankle and foot: Secondary | ICD-10-CM | POA: Diagnosis not present

## 2020-06-24 DIAGNOSIS — E1140 Type 2 diabetes mellitus with diabetic neuropathy, unspecified: Secondary | ICD-10-CM | POA: Diagnosis not present

## 2020-06-24 DIAGNOSIS — IMO0002 Reserved for concepts with insufficient information to code with codable children: Secondary | ICD-10-CM

## 2020-06-24 DIAGNOSIS — R6889 Other general symptoms and signs: Secondary | ICD-10-CM | POA: Diagnosis not present

## 2020-06-24 DIAGNOSIS — C187 Malignant neoplasm of sigmoid colon: Secondary | ICD-10-CM | POA: Diagnosis not present

## 2020-06-24 DIAGNOSIS — E119 Type 2 diabetes mellitus without complications: Secondary | ICD-10-CM

## 2020-06-24 DIAGNOSIS — F209 Schizophrenia, unspecified: Secondary | ICD-10-CM | POA: Diagnosis not present

## 2020-06-24 DIAGNOSIS — Z1231 Encounter for screening mammogram for malignant neoplasm of breast: Secondary | ICD-10-CM | POA: Diagnosis not present

## 2020-06-24 DIAGNOSIS — Z0100 Encounter for examination of eyes and vision without abnormal findings: Secondary | ICD-10-CM

## 2020-06-24 DIAGNOSIS — E785 Hyperlipidemia, unspecified: Secondary | ICD-10-CM | POA: Diagnosis not present

## 2020-06-24 DIAGNOSIS — E1169 Type 2 diabetes mellitus with other specified complication: Secondary | ICD-10-CM

## 2020-06-24 DIAGNOSIS — E1121 Type 2 diabetes mellitus with diabetic nephropathy: Secondary | ICD-10-CM | POA: Diagnosis not present

## 2020-06-24 DIAGNOSIS — E11610 Type 2 diabetes mellitus with diabetic neuropathic arthropathy: Secondary | ICD-10-CM | POA: Diagnosis not present

## 2020-06-24 DIAGNOSIS — I10 Essential (primary) hypertension: Secondary | ICD-10-CM | POA: Diagnosis not present

## 2020-06-24 DIAGNOSIS — F32A Depression, unspecified: Secondary | ICD-10-CM | POA: Diagnosis not present

## 2020-06-24 NOTE — Assessment & Plan Note (Signed)
Followed up with Podiatry, prefers a new referral for local provider - referral provided

## 2020-06-24 NOTE — Assessment & Plan Note (Signed)
BP Readings from Last 1 Encounters:  06/24/20 127/75   Well-controlled with HCTZ Counseled for compliance with the medications Advised DASH diet and moderate exercise/walking, at least 150 mins/week

## 2020-06-24 NOTE — Telephone Encounter (Signed)
I attempted to reach Violet Baldy, RN for [place], no answer. Voicemail, not personalize so no message left.  I called Annette, [place] and informed her that I was trying to track down results from a urinalysis order last week. She informed me that she will reach out to [place] and have her to contact me back.

## 2020-06-24 NOTE — Assessment & Plan Note (Signed)
On Lipitor Check lipid profile 

## 2020-06-24 NOTE — Telephone Encounter (Signed)
Copied from [place] [id number]. Topic: General - Call Back - No Documentation >> Jun 23, 2020 11:42 AM Erick Blinks wrote: Best contact: [phone] pt wants to be contacted about her lab specimen that was collected by her home health nurse from [facility]. She says her PCP authorized the visits

## 2020-06-24 NOTE — Assessment & Plan Note (Signed)
HbA1C: 10.1 On Lantus 25 U QD, Humalog 5 U BID PRN (blood glucose > 250) and Metformin 1000 mg QD Advised to follow diabetic diet On statin F/u CMP and lipid panel Diabetic foot exam: Referred to Podiatry Diabetic eye exam: Advised to follow up with Ophthalmology for diabetic eye exam

## 2020-06-24 NOTE — Patient Instructions (Addendum)
Please continue to take medications as prescribed. Please bring medications in the next office visit.  Please follow low carbohydrate diet and perform moderate exercise/walking as tolerated.  Please check blood glucose regularly and bring the log in the next visit.  Please get fasting blood tests done before the next visit.

## 2020-06-24 NOTE — Assessment & Plan Note (Signed)
H/o sigmoid colon ca. Last colonoscopy in 2021 Prefers local providers for GI and Oncology - referrals provided

## 2020-06-24 NOTE — Progress Notes (Signed)
New Patient Office Visit  Subjective:  Patient ID: [name], female    DOB: 1966/01/29  Age: 55 y.o. MRN: [id number]  CC:  Chief Complaint  Patient presents with  . New Patient (Initial Visit)    New patient has been having headache and diarrhea when she eats this has caused her to stop trying to eat     HPI [name] is a 55 year old female with PMH of HTN, uncontrolled type 2 DM with neuropathy, sigmoid colon ca. S/p partial colectomy, schizoaffective disorder and obesity who presents for establishing care.  She states that she was not taking care of herself, but now she wants to get back on track to take good care of her. She uses a walker to walk and has a wheelchair at home as well. She has h/o uncontrolled DM and Charcot's foot. She requests a local Podiatry referral. She does not check her blood glucose regularly. She is not compliant with her insulin regimen, but states that she is trying to improve it now.  She has h/o sigmoid colon ca. And had partial colectomy. She wants to follow up with local Oncologist and GI - referrals provided.  She follows with [facility] facility for Schizoaffective disorder. Denies any SI or HI.  She has had flu vaccine. She has not had COVID vaccine.  Past Medical History:  Diagnosis Date  . Charcot's joint of foot, left   . Depression   . Diabetes mellitus without complication ([place])   . Hyperlipidemia   . Hypertension   . Neuromuscular disorder ([place])   . Schizo affective schizophrenia ([place])    abilify and pazil    Past Surgical History:  Procedure Laterality Date  . CHOLECYSTECTOMY    . COLONOSCOPY WITH PROPOFOL N/A 05/26/2019   Procedure: COLONOSCOPY WITH PROPOFOL;  Surgeon: Toledo, Benay Pike, MD;  Location: [facility] ENDOSCOPY;  Service: Gastroenterology;  Laterality: N/A;  . ESOPHAGOGASTRODUODENOSCOPY (EGD) WITH PROPOFOL N/A 05/26/2019   Procedure: ESOPHAGOGASTRODUODENOSCOPY (EGD) WITH PROPOFOL;  Surgeon:  Toledo, Benay Pike, MD;  Location: [facility] END[facility]COPY;  Service: Gastroenterology;  Laterality: N/A;  . IRRIGATION AND DEBRIDEMENT FOOT Right 02/26/2016   Procedure: RIGHT 2ND TOE DEBRIDEMENT;  Surgeon: Samara Deist, DPM;  Location: [facility] ORS;  Service: Podiatry;  Laterality: Right;  . IRRIGATION AND DEBRIDEMENT FOOT Left 12/13/2018   Procedure: IRRIGATION AND DEBRIDEMENT FOOT;  Surgeon: Caroline More, DPM;  Location: [facility] ORS;  Service: Podiatry;  Laterality: Left;    Family History  Problem Relation Age of Onset  . Breast cancer Cousin        maternal cousin  . Hypertension Mother   . Hyperthyroidism Mother   . Dementia Mother   . Prostate cancer Father   . Diabetes Maternal Grandmother   . Hypertension Maternal Grandmother   . Cancer Maternal Grandmother        unknown type  . Diabetes Maternal Grandfather   . Hypertension Maternal Grandfather   . Diabetes Paternal Grandmother   . Hypertension Paternal Grandmother   . Diabetes Paternal Grandfather   . Hypertension Paternal Grandfather     Social History   Socioeconomic History  . Marital status: Divorced    Spouse name: Not on file  . Number of children: 2  . Years of education: Not on file  . Highest education level: Not on file  Occupational History  . Not on file  Tobacco Use  . Smoking status: Current Every Day Smoker    Packs/day: 3.00    Years:  30.00    Pack years: 90.00    Types: Cigarettes    Last attempt to quit: 05/02/2009    Years since quitting: 11.1  . Smokeless tobacco: Never Used  . Tobacco comment: Pt reported  quitting in 2011  Vaping Use  . Vaping Use: Never used  Substance and Sexual Activity  . Alcohol use: No  . Drug use: No  . Sexual activity: Not Currently  Other Topics Concern  . Not on file  Social History Narrative   At home with mom, 61. Lives in brothers house who moved out. Sister comes by every other day to check on patient and mom.   Social Determinants of Health   Financial  Resource Strain: Low Risk   . Difficulty of Paying Living Expenses: Not hard at all  Food Insecurity: No Food Insecurity  . Worried About Charity fundraiser in the Last Year: Never true  . Ran Out of Food in the Last Year: Never true  Transportation Needs: No Transportation Needs  . Lack of Transportation (Medical): No  . Lack of Transportation (Non-Medical): No  Physical Activity: Inactive  . Days of Exercise per Week: 0 days  . Minutes of Exercise per Session: 0 min  Stress: No Stress Concern Present  . Feeling of Stress : Only a little  Social Connections: Moderately Isolated  . Frequency of Communication with Friends and Family: More than three times a week  . Frequency of Social Gatherings with Friends and Family: More than three times a week  . Attends Religious Services: More than 4 times per year  . Active Member of Clubs or Organizations: No  . Attends Archivist Meetings: Never  . Marital Status: Divorced  Human resources officer Violence: Not on file    ROS Review of Systems  Constitutional: Negative for chills and fever.  HENT: Negative for congestion, sinus pressure, sinus pain and sore throat.   Eyes: Negative for pain and discharge.  Respiratory: Negative for cough and shortness of breath.   Cardiovascular: Negative for chest pain and palpitations.  Gastrointestinal: Negative for abdominal pain, constipation, nausea and vomiting.  Endocrine: Negative for polydipsia and polyuria.  Genitourinary: Negative for dysuria and hematuria.  Musculoskeletal: Positive for arthralgias. Negative for neck pain and neck stiffness.  Skin: Negative for rash.  Neurological: Negative for dizziness and weakness.  Psychiatric/Behavioral: Negative for agitation and behavioral problems. The patient is nervous/anxious.     Objective:   Today's Vitals: BP 127/75 (BP Location: Right Arm, Patient Position: Sitting, Cuff Size: Normal)   Pulse 90   Resp 18   Ht 5' 10.5" (1.791 m)    Wt 243 lb 1.9 oz (110.3 kg)   SpO2 100%   BMI 34.39 kg/m   Physical Exam Vitals reviewed.  Constitutional:      General: She is not in acute distress.    Appearance: She is obese. She is not diaphoretic.     Comments: Uses walker  HENT:     Head: Normocephalic and atraumatic.     Nose: Nose normal.     Mouth/Throat:     Mouth: Mucous membranes are moist.  Eyes:     General: No scleral icterus.    Extraocular Movements: Extraocular movements intact.     Pupils: Pupils are equal, round, and reactive to light.  Cardiovascular:     Rate and Rhythm: Normal rate and regular rhythm.     Pulses: Normal pulses.     Heart sounds: Normal heart sounds.  No murmur heard.   Pulmonary:     Breath sounds: Normal breath sounds. No wheezing or rales.  Abdominal:     Palpations: Abdomen is soft.     Tenderness: There is no abdominal tenderness.  Musculoskeletal:     Cervical back: Neck supple. No tenderness.     Right lower leg: No edema.     Left lower leg: No edema.     Comments: Diabetic shoe on left side  Skin:    General: Skin is warm.     Findings: No rash.  Neurological:     General: No focal deficit present.     Mental Status: She is alert and oriented to person, place, and time.  Psychiatric:        Mood and Affect: Mood normal.        Behavior: Behavior normal.     Assessment & Plan:   Problem List Items Addressed This Visit      Encounter to establish care    -  Primary Care established History and medications reviewed with the patient   Relevant Orders  CBC  CMP14+EGFR    Cardiovascular and Mediastinum   Essential hypertension (Chronic)    BP Readings from Last 1 Encounters:  06/24/20 127/75   Well-controlled with HCTZ Counseled for compliance with the medications Advised DASH diet and moderate exercise/walking, at least 150 mins/week       Relevant Orders   CMP14+EGFR     Digestive   Adenocarcinoma of sigmoid colon [facility])   Relevant Orders    Ambulatory referral to Oncology   Ambulatory referral to Gastroenterology     Endocrine   DM (diabetes mellitus), type 2, 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HCC) (Chronic)    HbA1C: 10.1 On Lantus 25 U QD, Humalog 5 U BID PRN (blood glucose > 250) and Metformin 1000 mg QD Advised to follow diabetic diet On statin F/u CMP and lipid panel Diabetic foot exam: Referred to Podiatry Diabetic eye exam: Advised to follow up with Ophthalmology for diabetic eye exam       Relevant Orders   CMP14+EGFR   Hemoglobin A1c   Lipid panel   Hyperlipidemia associated with type 2 diabetes mellitus (HCC)    On Lipitor Check lipid profile        Musculoskeletal and Integument   Charcot's joint of foot, left    Followed up with Podiatry, prefers a new referral for local provider - referral provided      Relevant Orders   Ambulatory referral to Podiatry     Other   Schizoaffective disorder ([place])    On Abilify and Wellbutrin On Trazodone Followed by Psychiatry      S/P partial colectomy    H/o sigmoid colon ca. Last colonoscopy in 2021 Prefers local providers for GI and Oncology - referrals provided       Other Visit Diagnoses    Diabetic eye exam [facility])       Relevant Orders   Ambulatory referral to Ophthalmology   Screening mammogram for breast cancer       Relevant Orders   MM 3D SCREEN BREAST BILATERAL      Outpatient Encounter Medications as of 06/24/2020  Medication Sig  . acetaminophen (TYLENOL) 325 MG tablet Take 2 tablets (650 mg total) by mouth every 6 (six) hours as needed for mild pain.  . ARIPiprazole (ABILIFY) 15 MG tablet TAKE 1 TABLET BY MOUTH DAILY.  [name] atorvastatin (LIPITOR) 40 MG tablet Take 1 tablet (40 mg total)  by mouth daily.  [name] buPROPion (WELLBUTRIN XL) 300 MG 24 hr tablet TAKE 1 TABLET BY MOUTH EACH MORNING.  . colestipol (COLESTID) 1 g tablet TAKE (2) TABLETS BY MOUTH TWICE DAILY.  . furosemide (LASIX) 40 MG tablet Take 1 tablet (40 mg total) by  mouth daily.  [name] gabapentin (NEURONTIN) 300 MG capsule Take 1 capsule (300 mg total) by mouth at bedtime.  . hydrochlorothiazide (HYDRODIURIL) 12.5 MG tablet Take 1 tablet (12.5 mg total) by mouth daily.  . insulin glargine (LANTUS SOLOSTAR) 100 UNIT/ML Solostar Pen Inject 25 Units into the skin at bedtime.  . insulin lispro (HUMALOG) 100 UNIT/ML injection Inject 5 Units into the skin 2 (two) times daily with a meal.  . Lancets (ONETOUCH DELICA PLUS [id number]) MISC USE TO TEST 3 TIMES DAILY.  . metFORMIN (GLUCOPHAGE) 1000 MG tablet Take 1 tablet (1,000 mg total) by mouth daily with breakfast.  . metoprolol succinate (TOPROL-XL) 50 MG 24 hr tablet Take 1 tablet (50 mg total) by mouth daily. Take with or immediately following a meal.  . mupirocin ointment (BACTROBAN) 2 % Apply 1 application topically 2 (two) times daily.  . ondansetron (ZOFRAN) 4 MG tablet Take 1 tablet (4 mg total) by mouth every 6 (six) hours.  Glory Rosebush VERIO test strip CHECK BLOOD SUGAR 3 TIMES DAILY.  . pantoprazole (PROTONIX) 40 MG tablet Take 40 mg by mouth 2 (two) times daily.  . traZODone (DESYREL) 50 MG tablet TAKE 1 TABLET BY MOUTH AT BEDTIME AS NEEDED FOR SLEEP.   No facility-administered encounter medications on file as of 06/24/2020.    Follow-up: Return in about 2 months (around 08/22/2020).   Lindell Spar, MD

## 2020-06-24 NOTE — Assessment & Plan Note (Signed)
On Abilify and Wellbutrin On Trazodone Followed by Psychiatry

## 2020-06-25 ENCOUNTER — Ambulatory Visit (INDEPENDENT_AMBULATORY_CARE_PROVIDER_SITE_OTHER): Payer: Medicare HMO | Admitting: *Deleted

## 2020-06-25 ENCOUNTER — Encounter (INDEPENDENT_AMBULATORY_CARE_PROVIDER_SITE_OTHER): Payer: Self-pay | Admitting: *Deleted

## 2020-06-25 ENCOUNTER — Telehealth: Payer: Medicare HMO

## 2020-06-25 ENCOUNTER — Other Ambulatory Visit (HOSPITAL_COMMUNITY)
Admission: RE | Admit: 2020-06-25 | Discharge: 2020-06-25 | Disposition: A | Discharge status: Home / Self Care | Payer: Medicare HMO | Source: Skilled Nursing Facility | Attending: Family Medicine | Admitting: Family Medicine

## 2020-06-25 ENCOUNTER — Telehealth: Payer: Self-pay

## 2020-06-25 DIAGNOSIS — N390 Urinary tract infection, site not specified: Secondary | ICD-10-CM | POA: Insufficient documentation

## 2020-06-25 DIAGNOSIS — IMO0002 Reserved for concepts with insufficient information to code with codable children: Secondary | ICD-10-CM

## 2020-06-25 DIAGNOSIS — I10 Essential (primary) hypertension: Secondary | ICD-10-CM | POA: Diagnosis not present

## 2020-06-25 DIAGNOSIS — E1140 Type 2 diabetes mellitus with diabetic neuropathy, unspecified: Secondary | ICD-10-CM | POA: Diagnosis not present

## 2020-06-25 DIAGNOSIS — E1121 Type 2 diabetes mellitus with diabetic nephropathy: Secondary | ICD-10-CM | POA: Diagnosis not present

## 2020-06-25 DIAGNOSIS — E1149 Type 2 diabetes mellitus with other diabetic neurological complication: Secondary | ICD-10-CM

## 2020-06-25 DIAGNOSIS — E785 Hyperlipidemia, unspecified: Secondary | ICD-10-CM | POA: Diagnosis not present

## 2020-06-25 DIAGNOSIS — E1165 Type 2 diabetes mellitus with hyperglycemia: Secondary | ICD-10-CM

## 2020-06-25 DIAGNOSIS — E11610 Type 2 diabetes mellitus with diabetic neuropathic arthropathy: Secondary | ICD-10-CM | POA: Diagnosis not present

## 2020-06-25 DIAGNOSIS — F32A Depression, unspecified: Secondary | ICD-10-CM | POA: Diagnosis not present

## 2020-06-25 DIAGNOSIS — G709 Myoneural disorder, unspecified: Secondary | ICD-10-CM | POA: Diagnosis not present

## 2020-06-25 DIAGNOSIS — F251 Schizoaffective disorder, depressive type: Secondary | ICD-10-CM

## 2020-06-25 DIAGNOSIS — F209 Schizophrenia, unspecified: Secondary | ICD-10-CM | POA: Diagnosis not present

## 2020-06-25 LAB — URINALYSIS, ROUTINE W REFLEX MICROSCOPIC
Bilirubin Urine: NEGATIVE
Glucose, UA: 150 mg/dL — AB
Ketones, ur: NEGATIVE mg/dL
Nitrite: NEGATIVE
Protein, ur: 30 mg/dL — AB
Specific Gravity, Urine: 1.016 (ref 1.005–1.030)
WBC, UA: >50 WBC/hpf — ABNORMAL HIGH (ref 0–5)
pH: 6 (ref 5.0–8.0)

## 2020-06-25 NOTE — Telephone Encounter (Signed)
Needs orders

## 2020-06-25 NOTE — Chronic Care Management (AMB) (Signed)
Chronic Care Management    Clinical Social Work Note  06/25/2020 Name: [name] MRN: [id number] DOB: [birth year]  [name] is a 55 y.o. year old female who is a primary care patient of Lindell Spar, MD. The CCM team was consulted to assist the patient with chronic disease management and/or care coordination needs related to: [employer]  and [employer] and Resources.   Engaged with patient by telephone for follow up visit in response to provider referral for social work chronic care management and care coordination services.   Consent to Services:  The patient was given information about Chronic Care Management services, agreed to services, and gave verbal consent prior to initiation of services.  Please see initial visit note for detailed documentation.   Patient agreed to services and consent obtained.   Assessment: Review of patient past medical history, allergies, medications, and health status, including review of relevant consultants reports was performed today as part of a comprehensive evaluation and provision of chronic care management and care coordination services.     SDOH (Social Determinants of Health) assessments and interventions performed:    Advanced Directives Status: Not addressed in this encounter.  CCM Care Plan  No Known Allergies  Outpatient Encounter Medications as of 06/25/2020  Medication Sig  . acetaminophen (TYLENOL) 325 MG tablet Take 2 tablets (650 mg total) by mouth every 6 (six) hours as needed for mild pain.  . ARIPiprazole (ABILIFY) 15 MG tablet TAKE 1 TABLET BY MOUTH DAILY.  [name] atorvastatin (LIPITOR) 40 MG tablet Take 1 tablet (40 mg total) by mouth daily.  [name] buPROPion (WELLBUTRIN XL) 300 MG 24 hr tablet TAKE 1 TABLET BY MOUTH EACH MORNING.  . colestipol (COLESTID) 1 g tablet TAKE (2) TABLETS BY MOUTH TWICE DAILY.  . furosemide (LASIX) 40 MG tablet Take 1 tablet (40 mg total) by mouth daily.  [name]  gabapentin (NEURONTIN) 300 MG capsule Take 1 capsule (300 mg total) by mouth at bedtime.  . hydrochlorothiazide (HYDRODIURIL) 12.5 MG tablet Take 1 tablet (12.5 mg total) by mouth daily.  . insulin glargine (LANTUS SOLOSTAR) 100 UNIT/ML Solostar Pen Inject 25 Units into the skin at bedtime.  . insulin lispro (HUMALOG) 100 UNIT/ML injection Inject 5 Units into the skin 2 (two) times daily with a meal.  . Lancets (ONETOUCH DELICA PLUS [id number]) MISC USE TO TEST 3 TIMES DAILY.  . metFORMIN (GLUCOPHAGE) 1000 MG tablet Take 1 tablet (1,000 mg total) by mouth daily with breakfast.  . metoprolol succinate (TOPROL-XL) 50 MG 24 hr tablet Take 1 tablet (50 mg total) by mouth daily. Take with or immediately following a meal.  . mupirocin ointment (BACTROBAN) 2 % Apply 1 application topically 2 (two) times daily.  . ondansetron (ZOFRAN) 4 MG tablet Take 1 tablet (4 mg total) by mouth every 6 (six) hours.  Glory Rosebush VERIO test strip CHECK BLOOD SUGAR 3 TIMES DAILY.  . pantoprazole (PROTONIX) 40 MG tablet Take 40 mg by mouth 2 (two) times daily.  . traZODone (DESYREL) 50 MG tablet TAKE 1 TABLET BY MOUTH AT BEDTIME AS NEEDED FOR SLEEP.   No facility-administered encounter medications on file as of 06/25/2020.    Patient Active Problem List   Diagnosis Date Noted  . Charcot's joint of foot, left   . Slurred speech 05/09/2020  . Schizophrenia ([place]) 02/20/2020  . Chronic diarrhea 11/25/2019  . History of colon cancer 11/25/2019  . Iron deficiency anemia 07/25/2019  . S/P partial colectomy 07/17/2019  .  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[place]) 05/28/2019  . Anemia   . Neuropathic foot ulcer, left, with fat layer exposed ([place]) 02/05/2019  . Morbid obesity ([place]) 05/10/2017  . Long-term use of high-risk medication 05/10/2017  . Hyperlipidemia associated with type 2 diabetes mellitus ([place]) 06/27/2016  . Hammer toe of second toe of right foot 04/20/2016  . Essential hypertension 06/29/2015  . Schizoaffective  disorder ([place]) 06/29/2015  . DM (diabetes mellitus), type 2, 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[place]) 16/10/3708  . Polyneuropathy 04/02/2014    Conditions to be addressed/monitored: Anxiety, Depression and Bipolar Disorder; Mental Health Concerns   Care Plan : LCSW Plan of Care  Updates made by Deirdre Peer, LCSW since 06/25/2020 12:00 AM    Problem: Coping Skills (General Plan of Care)     Long-Range Goal: Coping Skills Enhanced   Start Date: 06/25/2020  Expected End Date: 10/23/2020  This Visit's Progress: On track  Priority: High  Note:   Current Barriers:  . Chronic Mental Health needs related to Schizoaffective d/o, depression . Mental Health Concerns , Limited access to caregiver, and Cognitive Deficits . Suicidal Ideation/Homicidal Ideation: No  Clinical Social Work Goal(s):  . patient will work with SW bi-weekly by telephone or in person to reduce or manage symptoms related to mental health needs . Over the next 30 days, patient will work with SW to address concerns related to managing mental health needs, organizing appointments and setting goals for further independence.  Interventions: . Patient interviewed and appropriate assessments performed: brief mental health assessment . Patient interviewed and appropriate assessments performed . Discussed plans with patient for ongoing care management follow up and provided patient with direct contact information for care management team . Advised patient to schedule ongoing counseling appointments . Motivational Interviewing, Solution-Focused Strategies, and Emotional/Supportive Counseling  Patient Self Care Activities:  . Self administers medications as prescribed, Attends all scheduled provider appointments, Independent living, and Motivation for treatment  Patient Coping Strengths:  . Hopefulness  Patient Self Care Deficits:  . Lacks social connections . Does not maintain contact with provider  office  . Patient Goals: Over the next 120 days, patient/caregiver will work with SW to address concerns related to lack of support/resource connection. LCSW will assist patient in gaining additional support/resource connection and community resource education in order to maintain health and mental health appropriately  . Over the next 120 days, patient will demonstrate improved adherence to self care as evidenced by implementing healthy self-care into her daily routine such as: attending all medical appointments, deep breathing exercises, taking time for self-reflection, taking medications as prescribed, drinking water and daily exercise to improve mobility.  . Over the next 120 days, patient will demonstrate improved health management independence as evidenced by implementing healthy self-care skills and positive support/resources into her daily routine to help cope with stressors and improve overall health and well-being   Follow Up Plan: Appointment scheduled for SW follow up with client by phone on 07/08/2020 , Client will call and schedule virtual counseling appointments, and utilize calendar to be mailed to her for organizing all appointments and notes/info related to her health and mental health.          Follow Up Plan: Appointment scheduled for SW follow up with client by phone on: 07/08/2020      Eduard Clos MSW, LCSW Licensed Clinical Social Education officer, environmental Primary Care [phone]

## 2020-06-25 NOTE — Patient Instructions (Signed)
Visit Information  PATIENT GOALS: Goals Addressed            This Visit's Progress   . Become more independent in managment of my health and mental health       Timeframe:  Long-Range Goal Priority:  Medium Start Date: 06/25/2020                        Expected End Date: 09/29/2020                  Follow Up Date 07/08/2020   - continue personal counseling - start a calendar notebook to organize all appointments - start or continue a personal journal - talk about feelings with a friend, family or spiritual advisor - practice positive thinking and self-talk    Why is this important?    When you are stressed, down or upset, your body reacts too.   For example, your blood pressure may get higher; you may have a headache or stomachache.   When your emotions get the best of you, your body's ability to fight off cold and flu gets weak.   These steps will help you manage your emotions.          The patient verbalized understanding of instructions, educational materials, and care plan provided today and declined offer to receive copy of patient instructions, educational materials, and care plan.   Telephone follow up appointment with care management team member scheduled for: 07/08/2020  Eduard Clos MSW, LCSW Licensed Clinical Social Education officer, environmental Primary Care [phone]

## 2020-06-26 NOTE — Telephone Encounter (Signed)
Ok. Thanks!

## 2020-06-26 NOTE — Telephone Encounter (Signed)
Spoke with Janett Billow and she informed me that pt hasn't been taking her antipsychotic medication at night. She also wanted to let us know that she did get a urine sample and dropped that off yesterday, she was complaining of burning, and will call us with those results.

## 2020-06-28 LAB — URINE CULTURE: Culture: >=100000 — AB

## 2020-06-29 ENCOUNTER — Telehealth: Payer: Self-pay | Admitting: Family Medicine

## 2020-06-29 ENCOUNTER — Other Ambulatory Visit: Payer: Self-pay | Admitting: Internal Medicine

## 2020-06-29 ENCOUNTER — Other Ambulatory Visit: Payer: Self-pay | Admitting: *Deleted

## 2020-06-29 DIAGNOSIS — IMO0002 Reserved for concepts with insufficient information to code with codable children: Secondary | ICD-10-CM

## 2020-06-29 DIAGNOSIS — E1165 Type 2 diabetes mellitus with hyperglycemia: Secondary | ICD-10-CM

## 2020-06-29 DIAGNOSIS — N3000 Acute cystitis without hematuria: Secondary | ICD-10-CM

## 2020-06-29 MED ORDER — ONETOUCH DELICA PLUS LANCET33G MISC: 0 refills | Status: DC

## 2020-06-29 MED ORDER — CIPROFLOXACIN HCL 500 MG PO TABS: 500.0000 mg | ORAL_TABLET | Freq: Two times a day (BID) | ORAL | 0 refills | Status: DC

## 2020-06-29 MED ORDER — LANTUS SOLOSTAR 100 UNIT/ML ~~LOC~~ SOPN: 25.0000 [IU] | PEN_INJECTOR | Freq: Every day | SUBCUTANEOUS | Status: DC

## 2020-06-29 NOTE — Telephone Encounter (Signed)
The pt was notified that she need to contact her current PCP Dr. Posey Pronto to address any concerns she have.

## 2020-06-29 NOTE — Telephone Encounter (Signed)
Pt is aware dr Raliegh Ip may not put order in for her to have home health aid. Pt does have a new PCP dr patel. Pt wanted me to ask . Patient would like to have referral to [facility] in [place] phone number [phone]

## 2020-06-29 NOTE — Telephone Encounter (Signed)
Thanks for notification.  We just received the [facility] orders for [place] [facility] at the end of last week after she has already seen Dr Posey Pronto. It was forwarded via fax to Dr Serita Grit office to be completed.  Her new PCP office is now local to her in [place] and part of [place], if future call requests to our office could be routed to new PCP that would help patient stay connected.  Thank you  [name], DO [facility] 06/29/2020, 11:55 AM

## 2020-06-30 ENCOUNTER — Other Ambulatory Visit: Payer: Self-pay | Admitting: *Deleted

## 2020-06-30 DIAGNOSIS — I10 Essential (primary) hypertension: Secondary | ICD-10-CM

## 2020-06-30 DIAGNOSIS — F251 Schizoaffective disorder, depressive type: Secondary | ICD-10-CM

## 2020-06-30 MED ORDER — PANTOPRAZOLE SODIUM 40 MG PO TBEC: 40.0000 mg | DELAYED_RELEASE_TABLET | Freq: Two times a day (BID) | ORAL | 0 refills | Status: DC

## 2020-06-30 MED ORDER — METFORMIN HCL 1000 MG PO TABS: 1000.0000 mg | ORAL_TABLET | Freq: Every day | ORAL | 1 refills | Status: DC

## 2020-06-30 MED ORDER — ARIPIPRAZOLE 15 MG PO TABS: 15.0000 mg | ORAL_TABLET | Freq: Every day | ORAL | 0 refills | Status: DC

## 2020-06-30 MED ORDER — BUPROPION HCL ER (XL) 300 MG PO TB24: ORAL_TABLET | ORAL | 0 refills | Status: DC

## 2020-06-30 MED ORDER — METOPROLOL SUCCINATE ER 50 MG PO TB24: 50.0000 mg | ORAL_TABLET | Freq: Every day | ORAL | 1 refills | Status: DC

## 2020-07-01 ENCOUNTER — Telehealth: Payer: Medicare HMO

## 2020-07-01 ENCOUNTER — Other Ambulatory Visit: Payer: Self-pay

## 2020-07-01 NOTE — Patient Outreach (Signed)
Care Coordination  07/01/2020  [name] 1965/11/24 [id number]   Medicaid Managed Care   Unsuccessful Outreach Note  07/01/2020 Name: [name] MRN: [id number] DOB: February 13, 1966  Referred by: Lindell Spar, MD Reason for referral : High Risk Managed Medicaid (MM Unsuccessful Telephone Outreach)   An unsuccessful telephone outreach was attempted today. The patient was referred to the case management team for assistance with care management and care coordination.   Follow Up Plan: The patient has been provided with contact information for the care management team and has been advised to call with any health related questions or concerns.   Mickel Fuchs, BSW, [place]  High Risk Managed Medicaid Team

## 2020-07-01 NOTE — Patient Instructions (Signed)
Visit Information  [name]  - as a part of your Medicaid benefit, you are eligible for care management and care coordination services at no cost or copay. I was unable to reach you by phone today but would be happy to help you with your health related needs. Please feel free to call me @ [phone].     Mickel Fuchs, BSW, [place]  High Risk Managed Medicaid Team

## 2020-07-02 ENCOUNTER — Telehealth: Payer: Self-pay | Admitting: *Deleted

## 2020-07-02 ENCOUNTER — Ambulatory Visit (INDEPENDENT_AMBULATORY_CARE_PROVIDER_SITE_OTHER): Payer: Medicare Other | Admitting: *Deleted

## 2020-07-02 DIAGNOSIS — E1149 Type 2 diabetes mellitus with other diabetic neurological complication: Secondary | ICD-10-CM | POA: Diagnosis not present

## 2020-07-02 DIAGNOSIS — I10 Essential (primary) hypertension: Secondary | ICD-10-CM

## 2020-07-02 DIAGNOSIS — E1165 Type 2 diabetes mellitus with hyperglycemia: Secondary | ICD-10-CM

## 2020-07-02 DIAGNOSIS — IMO0002 Reserved for concepts with insufficient information to code with codable children: Secondary | ICD-10-CM

## 2020-07-02 NOTE — Telephone Encounter (Signed)
[facility] called they received FL2 for pt for home care services. However her previous provider filled out that she needed more care due to her episodes being really bad. She will fax back over with previous providers response so provider could see. Advised we had only saw the patient one time so we could only fill out based off of records and what we knew. She will send back over

## 2020-07-02 NOTE — Patient Instructions (Signed)
Visit Information  PATIENT GOALS: Goals Addressed            This Visit's Progress   . Monitor and Manage My Blood Sugar-Diabetes Type 2       Timeframe:  Long-Range Goal Priority:  High Start Date:           07/02/2020                  Expected End Date:   01/02/2021                    Follow Up Date 08/06/2020   . - check blood sugar at prescribed times . - check blood sugar if I feel it is too high or too low . - take the blood sugar log to all doctor visits . - take the blood sugar meter to all doctor visits . Follow up with podiatrist on 08/26/20 and Dr. Posey Pronto 08/26/20 . Follow low carb diet and try to exercise . Pickup new prescription from [place] for UTI . Schedule eye exam   Why is this important?    Checking your blood sugar at home helps to keep it from getting very high or very low.   Writing the results in a diary or log helps the doctor know how to care for you.   Your blood sugar log should have the time, date and the results.   Also, write down the amount of insulin or other medicine that you take.   Other information, like what you ate, exercise done and how you were feeling, will also be helpful.     Notes:     . Track and Manage My Blood Pressure-Hypertension   On track    Timeframe:  Long-Range Goal Priority:  High Start Date:        07/02/2020                     Expected End Date:     01/02/2021                  Follow Up Date 08/06/2020 .   . - check blood pressure daily . - write blood pressure results in a log or diary  . Attend all scheduled provider appointments . Calls provider office for new concerns, questions, or BP outside discussed parameters . Get new BP cuff from [facility] insurance and check BP and record as discussed . Follows a low sodium diet/DASH diet . - home or ambulatory blood pressure monitoring encouraged   Why is this important?    You won't feel high blood pressure, but it can still hurt your blood vessels.   High  blood pressure can cause heart or kidney problems. It can also cause a stroke.   Making lifestyle changes like losing a little weight or eating less salt will help.   Checking your blood pressure at home and at different times of the day can help to control blood pressure.   If the doctor prescribes medicine remember to take it the way the doctor ordered.   Call the office if you cannot afford the medicine or if there are questions about it.     Notes:        Patient verbalizes understanding of instructions provided today and agrees to view in [place].   Telephone follow up appointment with care management team member scheduled for:  08/06/2020  Jacqlyn Larsen [facility], BSN RN Case Manager [facility]  [phone] Urinary Tract Infection, Adult A urinary tract infection (UTI) is an infection of any part of the urinary tract. The urinary tract includes:  The kidneys.  The ureters.  The bladder.  The urethra. These organs make, store, and get rid of pee (urine) in the body. What are the causes? This infection is caused by germs (bacteria) in your genital area. These germs grow and cause swelling (inflammation) of your urinary tract. What increases the risk? The following factors may make you more likely to develop this condition:  Using a small, thin tube (catheter) to drain pee.  Not being able to control when you pee or poop (incontinence).  Being female. If you are female, these things can increase the risk: ? Using these methods to prevent pregnancy:  A medicine that kills sperm (spermicide).  A device that blocks sperm (diaphragm). ? Having low levels of a female hormone (estrogen). ? Being pregnant. You are more likely to develop this condition if:  You have genes that add to your risk.  You are sexually active.  You take antibiotic medicines.  You have trouble peeing because of: ? A prostate that is bigger than normal, if you are female. ? A blockage  in the part of your body that drains pee from the bladder. ? A kidney stone. ? A nerve condition that affects your bladder. ? Not getting enough to drink. ? Not peeing often enough.  You have other conditions, such as: ? Diabetes. ? A weak disease-fighting system (immune system). ? Sickle cell disease. ? Gout. ? Injury of the spine. What are the signs or symptoms? Symptoms of this condition include:  Needing to pee right away.  Peeing small amounts often.  Pain or burning when peeing.  Blood in the pee.  Pee that smells bad or not like normal.  Trouble peeing.  Pee that is cloudy.  Fluid coming from the vagina, if you are female.  Pain in the belly or lower back. Other symptoms include:  Vomiting.  Not feeling hungry.  Feeling mixed up (confused). This may be the first symptom in older adults.  Being tired and grouchy (irritable).  A fever.  Watery poop (diarrhea). How is this treated?  Taking antibiotic medicine.  Taking other medicines.  Drinking enough water. In some cases, you may need to see a specialist. Follow these instructions at home: Medicines  Take over-the-counter and prescription medicines only as told by your doctor.  If you were prescribed an antibiotic medicine, take it as told by your doctor. Do not stop taking it even if you start to feel better. General instructions  Make sure you: ? Pee until your bladder is empty. ? Do not hold pee for a long time. ? Empty your bladder after sex. ? Wipe from front to back after peeing or pooping if you are a female. Use each tissue one time when you wipe.  Drink enough fluid to keep your pee pale yellow.  Keep all follow-up visits.   Contact a doctor if:  You do not get better after 1-2 days.  Your symptoms go away and then come back. Get help right away if:  You have very bad back pain.  You have very bad pain in your lower belly.  You have a fever.  You have chills.  You  feeling like you will vomit or you vomit. Summary  A urinary tract infection (UTI) is an infection of any part of the urinary tract.  This condition is  caused by germs in your genital area.  There are many risk factors for a UTI.  Treatment includes antibiotic medicines.  Drink enough fluid to keep your pee pale yellow. This information is not intended to replace advice given to you by your health care provider. Make sure you discuss any questions you have with your health care provider. Document Revised: 11/29/2019 Document Reviewed: 11/29/2019 Elsevier Patient Education  [place].

## 2020-07-02 NOTE — Chronic Care Management (AMB) (Signed)
Chronic Care Management   CCM RN Visit Note  07/02/2020 Name: [name] MRN: [id number] DOB: 31-Jul-1965  Subjective: [name] is a 55 y.o. year old female who is a primary care patient of Lindell Spar, MD. The care management team was consulted for assistance with disease management and care coordination needs.    Engaged with patient by telephone for follow up visit in response to provider referral for case management and/or care coordination services.   Consent to Services:  The patient was given information about Chronic Care Management services, agreed to services, and gave verbal consent prior to initiation of services.  Please see initial visit note for detailed documentation.   Patient agreed to services and verbal consent obtained.   Assessment: Review of patient past medical history, allergies, medications, health status, including review of consultants reports, laboratory and other test data, was performed as part of comprehensive evaluation and provision of chronic care management services.   SDOH (Social Determinants of Health) assessments and interventions performed:    CCM Care Plan  No Known Allergies  Outpatient Encounter Medications as of 07/02/2020  Medication Sig  . ONETOUCH VERIO test strip CHECK BLOOD SUGAR 3 TIMES DAILY.  [name] acetaminophen (TYLENOL) 325 MG tablet Take 2 tablets (650 mg total) by mouth every 6 (six) hours as needed for mild pain.  . ARIPiprazole (ABILIFY) 15 MG tablet Take 1 tablet (15 mg total) by mouth daily.  [name] atorvastatin (LIPITOR) 40 MG tablet Take 1 tablet (40 mg total) by mouth daily.  [name] buPROPion (WELLBUTRIN XL) 300 MG 24 hr tablet TAKE 1 TABLET BY MOUTH EACH MORNING.  . ciprofloxacin (CIPRO) 500 MG tablet Take 1 tablet (500 mg total) by mouth 2 (two) times daily.  . colestipol (COLESTID) 1 g tablet TAKE (2) TABLETS BY MOUTH TWICE DAILY.  . furosemide (LASIX) 40 MG tablet Take 1 tablet (40 mg total) by mouth daily.   [name] gabapentin (NEURONTIN) 300 MG capsule Take 1 capsule (300 mg total) by mouth at bedtime.  . hydrochlorothiazide (HYDRODIURIL) 12.5 MG tablet Take 1 tablet (12.5 mg total) by mouth daily.  . insulin glargine (LANTUS SOLOSTAR) 100 UNIT/ML Solostar Pen Inject 25 Units into the skin at bedtime.  . insulin lispro (HUMALOG) 100 UNIT/ML injection Inject 5 Units into the skin 2 (two) times daily with a meal.  . Lancets (ONETOUCH DELICA PLUS [id number]) MISC USE TO TEST 3 TIMES DAILY.  . metFORMIN (GLUCOPHAGE) 1000 MG tablet Take 1 tablet (1,000 mg total) by mouth daily with breakfast.  . metoprolol succinate (TOPROL-XL) 50 MG 24 hr tablet Take 1 tablet (50 mg total) by mouth daily. Take with or immediately following a meal.  . mupirocin ointment (BACTROBAN) 2 % Apply 1 application topically 2 (two) times daily.  . ondansetron (ZOFRAN) 4 MG tablet Take 1 tablet (4 mg total) by mouth every 6 (six) hours.  . pantoprazole (PROTONIX) 40 MG tablet Take 1 tablet (40 mg total) by mouth 2 (two) times daily.  . traZODone (DESYREL) 50 MG tablet TAKE 1 TABLET BY MOUTH AT BEDTIME AS NEEDED FOR SLEEP.   No facility-administered encounter medications on file as of 07/02/2020.    Patient Active Problem List   Diagnosis Date Noted  . Charcot's joint of foot, left   . Slurred speech 05/09/2020  . Schizophrenia ([place]) 02/20/2020  . Chronic diarrhea 11/25/2019  . History of colon cancer 11/25/2019  . Iron deficiency anemia 07/25/2019  . S/P partial colectomy 07/17/2019  . 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 Anemia   . Neuropathic foot ulcer, left, with fat layer exposed ([place]) 02/05/2019  . Morbid obesity ([place]) 05/10/2017  . Long-term use of high-risk medication 05/10/2017  . Hyperlipidemia associated with type 2 diabetes mellitus ([place]) 06/27/2016  . Hammer toe of second toe of right foot 04/20/2016  . Essential hypertension 06/29/2015  . Schizoaffective disorder ([place]) 06/29/2015  . DM (diabetes  mellitus), type 2, 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[place]) 78/93/8101  . Polyneuropathy 04/02/2014    Conditions to be addressed/monitored:HTN and DMII  Care Plan : RNCM: Diabetes Type 2 (Adult)  Updates made by Kassie Mends, RN since 07/02/2020 12:00 AM    Problem: RNCM: Glycemic Management (Diabetes, Type 2)   Priority: High    Long-Range Goal: RNCM: Glycemic Management Optimized   Start Date: 07/02/2020  Expected End Date: 01/02/2021  This Visit's Progress: On track  Priority: High  Note:   Objective:  Lab Results  Component Value Date   HGBA1C 10.1 (H) 05/08/2020 .   Lab Results  Component Value Date   CREATININE 1.09 (H) 05/11/2020   CREATININE 1.34 (H) 05/10/2020   CREATININE 1.95 (H) 05/09/2020 .   [name] No results found for: EGFR Current Barriers:  [name] Knowledge Deficits related to basic Diabetes pathophysiology and self care/management- per MD note, pt needs to schedule eye exam and see podiatrist (referral placed), continue to see psychiatrist at [facility]. . Knowledge Deficits related to medications used for management of diabetes . Does not have glucometer to monitor blood sugar- client now has new glucometer One Touch Verio . Film/video editor . Cognitive Deficits . Limited Social Support . Unable to independently manage DM2- CBG readings in morning 175-220, in evening 230's range . Does not adhere to provider recommendations re: take medications, keep appointments, adhere to prescribed diet- pt reports she is eating better with improved appetite (eating tuna, chicken, vegetables) . Does not attend all scheduled provider appointments . Does not adhere to prescribed medication regimen- pt has all medications and is to pickup new medication for recently diagnosed UTI  . Lacks social connections . Unable to perform IADLs independently . Does not maintain contact with provider office . Does not contact provider office for questions/concerns Case Manager Clinical  Goal(s):  [name] Collaboration with Dr.Kutwik Posey Pronto regarding development and update of comprehensive plan of care as evidenced by provider attestation and co-signature . Inter-disciplinary care team collaboration (see longitudinal plan of care) . Over the next 120 days, patient will demonstrate improved adherence to prescribed treatment plan for diabetes self care/management as evidenced by:  . daily monitoring and recording of CBG  . adherence to ADA/ carb modified diet . adherence to prescribed medication regimen Interventions:  . Provided education to patient about basic DM disease process . Reviewed medications with patient and discussed importance of medication adherence . Discussed plans with patient for ongoing care management follow up and provided patient with direct contact information for care management team- [phone] . Reviewed with patient signs/ symptoms hypo and hyperglycemia and importance of correct treatment . Reviewed scheduled/upcoming provider appointments including: Dr. Posey Pronto 08/26/20 and podiatrist 08/26/20, cancer center 07/09/20. . Advised patient to check cbg TID and record . Assessed that patient has new glucometer One Touch Verio and using at present checking CBG BID (plans to increase to TID) . Review of patient status, including review of consultants reports, relevant laboratory and other test results, and medications completed. Patient Goals/Self-Care Activities . Over the next 120 days, patient  will:  Self administers oral medications as prescribed Self administers insulin as prescribed Attends all scheduled provider appointments Checks blood sugars as prescribed and utilize hyper and hypoglycemia protocol as needed Adheres to prescribed ADA/carb modified . Follow up with podiatrist on 08/26/20 and Dr. Posey Pronto 08/26/20 . Follow low carb diet and try to exercise . Pickup new prescription from [place] for UTI . Schedule eye exam Follow Up Plan: Telephone  follow up appointment with care management team member scheduled for:  08/06/2020   Care Plan : RNCM: Hypertension (Adult)  Updates made by Kassie Mends, RN since 07/02/2020 12:00 AM    Problem: RNCM: Hypertension (Hypertension)   Priority: Medium    Goal: RNCM: Hypertension Monitored   Priority: Medium  Note:   Objective:  . Last practice recorded BP readings:  BP Readings from Last 3 Encounters:  05/11/20 (!) 156/74  04/17/20 134/76  03/04/20 (!) 170/80 .   [name] Most recent eGFR/CrCl: No results found for: EGFR  No components found for: CRCL Current Barriers:  [name] Knowledge Deficits related to basic understanding of hypertension pathophysiology and self care management . Knowledge Deficits related to understanding of medications prescribed for management of hypertension . Non-adherence to prescribed medication regimen . Non-adherence to scheduled provider appointments- client has been trying to do better with this and has been attending recent appointments . Transportation barriers . Cognitive Deficits . Limited Social Support . Film/video editor.  . Unable to independently management of HTN  . Does not adhere to provider recommendations re: dietary restrictions, medications management and lifestyle changes  . Does not attend all scheduled provider appointments . Does not adhere to prescribed medication regimen . Lacks social connections . Unable to perform IADLs independently . Does not maintain contact with provider office . Does not contact provider office for questions/concerns Case Manager Clinical Goal(s):  [name] Over the next 120 days, patient will verbalize understanding of plan for hypertension management . Over the next 120 days, patient will attend all scheduled medical appointments: saw new primary care provider Dr. Posey Pronto 06/24/2020, to follow up 08/26/2020 . Over the next 120 days, patient will demonstrate improved adherence to prescribed treatment plan for hypertension  as evidenced by taking all medications as prescribed, monitoring and recording blood pressure as directed, adhering to low sodium/DASH diet . Over the next 120 days, patient will demonstrate improved health management independence as evidenced by checking blood pressure as directed and notifying PCP if SBP>160 or DBP > 90, taking all medications as prescribe, and adhering to a low sodium diet as discussed. . Over the next 120 days, patient will verbalize basic understanding of hypertension disease process and self health management plan as evidenced by compliance with medications, diet, and working with CCM team to maintain health and well being.  Interventions:  . Collaboration with Dr. Ihor Dow regarding development and update of comprehensive plan of care as evidenced by provider attestation and co-signature . Inter-disciplinary care team collaboration (see longitudinal plan of care) . Evaluation of current treatment plan related to hypertension self management and patient's adherence to plan as established by provider. . Provided education to patient re: importance of getting BP cuff and checking daily, DASH diet, complications of uncontrolled blood pressure and calling PCP for readings outside normal limits . Reviewed medications with patient and discussed importance of compliance . Discussed plans with patient for ongoing care management follow up . Reviewed scheduled/upcoming provider appointments including: Dr. Posey Pronto 4/27, [facility] 07/09/20 (hx. Colon CA)  Patient Goals/Self-Care Activities . Over the next 120 days, patient will:  - Self administers medications as prescribed Attends all scheduled provider appointments Calls provider office for new concerns, questions, or BP outside discussed parameters Get new BP cuff from [facility] insurance and check BP and record as discussed Follows a low sodium diet/DASH diet - home or ambulatory blood pressure monitoring encouraged Follow  Up Plan: Telephone follow up appointment with care management team member scheduled for:  08/06/2020     Plan:Telephone follow up appointment with care management team member scheduled for:  08/06/2020  Jacqlyn Larsen [facility], BSN RN Case Manager [place] [facility] [phone]

## 2020-07-06 ENCOUNTER — Telehealth: Payer: Self-pay

## 2020-07-06 ENCOUNTER — Encounter (HOSPITAL_COMMUNITY): Payer: Self-pay

## 2020-07-06 NOTE — Progress Notes (Signed)
I have attempted to reach the patient for an introductory phone call. I wan unable to reach the patient and there was no VM box available. I will plan to meet with the patient during her initial visit with Dr. Delton Coombes

## 2020-07-08 ENCOUNTER — Telehealth: Payer: Medicare HMO

## 2020-07-09 ENCOUNTER — Inpatient Hospital Stay (HOSPITAL_COMMUNITY): Payer: Medicare Other | Attending: Hematology | Admitting: Hematology

## 2020-07-09 ENCOUNTER — Telehealth: Payer: Self-pay | Admitting: *Deleted

## 2020-07-10 NOTE — Telephone Encounter (Signed)
  Chronic Care Management   Outreach Note  07/09/2020 Name: [name] MRN: [id number] DOB: 03/31/1966  Referred by: Lindell Spar, MD Reason for referral : Chronic Care Management (Unsuccessful outreach call)   An unsuccessful telephone outreach was attempted today. The patient was referred to the case management team for assistance with care management and care coordination.   Follow Up Plan: Telephone follow up appointment with care management team member scheduled for: The care management team will reach out to the patient again over the next 7 days.   Eduard Clos MSW, LCSW Licensed Music therapist Primary Care [phone]

## 2020-07-15 ENCOUNTER — Telehealth: Payer: Medicare Other

## 2020-07-21 ENCOUNTER — Telehealth: Payer: Self-pay | Admitting: *Deleted

## 2020-07-21 ENCOUNTER — Telehealth: Payer: Medicare Other

## 2020-07-21 NOTE — Telephone Encounter (Signed)
  Chronic Care Management   Outreach Note  07/21/2020 Name: [name] MRN: [id number] DOB: Jun 02, 1965  Referred by: Lindell Spar, MD Reason for referral : Chronic Care Management (CCM CSW outreach call attempt #2 (unsuccessful))   A second unsuccessful telephone outreach was attempted today. The patient was referred to the case management team for assistance with care management and care coordination.   Follow Up Plan: The care management team will reach out to the patient again over the next 7 days.   Eduard Clos MSW, LCSW Licensed Music therapist Primary Care [phone]

## 2020-07-22 ENCOUNTER — Telehealth: Payer: Self-pay | Admitting: *Deleted

## 2020-07-22 NOTE — Telephone Encounter (Signed)
Pt lvm stating she needed some meds called into mail order pharmacy. Stated for [place] to call them in today and not wait while we were setting around doing nothing. Attempted to call pt back and ask her what medications we needed to send there was NA NVM

## 2020-07-28 ENCOUNTER — Telehealth: Payer: Self-pay | Admitting: *Deleted

## 2020-07-28 ENCOUNTER — Telehealth: Payer: Medicare Other

## 2020-07-28 NOTE — Telephone Encounter (Signed)
  Chronic Care Management   Outreach Note  07/28/2020 Name: [name] MRN: [id number] DOB: 06-29-1965  Referred by: Lindell Spar, MD Reason for referral : No chief complaint on file.   A second unsuccessful telephone outreach was attempted today. The patient was referred to the case management team for assistance with care management and care coordination.   Follow Up Plan: The care management team will reach out to the patient again over the next 7 days. to reschedule apppointment Eduard Clos MSW, LCSW Licensed Clinical Social Education officer, environmental Primary Care [phone]

## 2020-07-29 ENCOUNTER — Telehealth: Payer: Self-pay

## 2020-08-03 ENCOUNTER — Telehealth (INDEPENDENT_AMBULATORY_CARE_PROVIDER_SITE_OTHER): Payer: Self-pay

## 2020-08-03 ENCOUNTER — Other Ambulatory Visit (INDEPENDENT_AMBULATORY_CARE_PROVIDER_SITE_OTHER): Payer: Self-pay

## 2020-08-03 ENCOUNTER — Ambulatory Visit (INDEPENDENT_AMBULATORY_CARE_PROVIDER_SITE_OTHER): Payer: Medicare Other | Admitting: Gastroenterology

## 2020-08-03 ENCOUNTER — Encounter (INDEPENDENT_AMBULATORY_CARE_PROVIDER_SITE_OTHER): Payer: Self-pay

## 2020-08-03 ENCOUNTER — Encounter (INDEPENDENT_AMBULATORY_CARE_PROVIDER_SITE_OTHER): Payer: Self-pay | Admitting: Gastroenterology

## 2020-08-03 ENCOUNTER — Other Ambulatory Visit: Payer: Self-pay

## 2020-08-03 VITALS — BP 149/84 | HR 111 | Temp 97.5°F | Ht 70.5 in | Wt 247.0 lb

## 2020-08-03 DIAGNOSIS — D649 Anemia, unspecified: Secondary | ICD-10-CM

## 2020-08-03 DIAGNOSIS — E739 Lactose intolerance, unspecified: Secondary | ICD-10-CM

## 2020-08-03 DIAGNOSIS — Z85038 Personal history of other malignant neoplasm of large intestine: Secondary | ICD-10-CM | POA: Diagnosis not present

## 2020-08-03 MED ORDER — PEG 3350-KCL-NA BICARB-NACL 420 G PO SOLR: 4000.0000 mL | ORAL | 0 refills | Status: DC

## 2020-08-03 NOTE — Telephone Encounter (Signed)
LeighAnn Maruice Pieroni, CMA  

## 2020-08-03 NOTE — Progress Notes (Signed)
[name], M.D. Gastroenterology & Hepatology [facility] [address] [place], [place] [address]  Primary Care Physician: Lindell Spar, MD [address] [place] [address]  I will communicate my assessment and recommendations to the referring MD via EMR.  Problems: 1. Chronic diarrhea 2. 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  The patient was last seen on 11/25/2019. At that time, the patient was counseled to start taking Metamucil to increase the stool bulk.  She was also prescribed Colestid to take as needed when she had severe diarrhea.  Was scheduled to undergo a surveillance colonoscopy with random colonic biopsies but she canceled this procedure due to multiple personal issues at that moment.  Was checked for her iron stores which showed normal iron of 57,Ferritin was normal at 198 and saturation was borderline 13%.  Hemoglobin at that time was 10.2 but repeat measurements since then have shown a slow decline with most recent value of 9.2 on 05/09/2020.  Patient reports that she is still having diarrhea. States that dairy causes diarrhea immediately. Also has noticed that greasy food causes some watery diarrhea. Almost every day has 2-3 BMs per day. Very occcasionally has a day without having BMs. She has not taken the colestid, unclear why she has not tried the medication yet although she reports that she was afraid that she was going to have a side effect from medication. Twice a week she present episodes of fecal soiling. Has not tried Imodium in the past. The patient denies having  any nausea, vomiting, fever, chills, hematochezia, melena, hematemesis, abdominal distention, abdominal pain, diarrhea, jaundice, pruritus. Has gained 17 lb since last time seen in clinic.  Last EGD: 05/26/2019-small sessile polyps between 3 to 7 mm in the gastric fundus which were biopsied.  Otherwise normal exam.  Pathology negative for H. pylori or any other ulcerations.  Biopsies of GE junction were negative for EOE or dysplastic changes, changes consistent with mild esophagitis.  Last Colonoscopy: January 2021 showed a partially obstructing tumor in the mid sigmoid colon which was biopsied and tattooed, rest of exam was within normal limits.  Pathology of biopsies compatible with adenocarcinoma.  Past Medical History: Past Medical History:  Diagnosis Date  . Charcot's joint of foot, left   . Depression   . Diabetes mellitus without complication ([place])   . Hyperlipidemia   . Hypertension   . Neuromuscular disorder ([place])   . Schizo affective schizophrenia ([place])    abilify and pazil    Past Surgical History: Past Surgical History:  Procedure Laterality Date  . CHOLECYSTECTOMY    . COLONOSCOPY WITH PROPOFOL N/A 05/26/2019   Procedure: COLONOSCOPY WITH PROPOFOL;  Surgeon: Toledo, Benay Pike, MD;  Location: [facility];  Service: Gastroenterology;  Laterality: N/A;  . ESOPHAGOGASTRODUODENOSCOPY (EGD) WITH PROPOFOL N/A 05/26/2019   Procedure: ESOPHAGOGASTRODUODENOSCOPY (EGD) WITH PROPOFOL;  Surgeon: Toledo, Benay Pike, MD;  Location: [facility];  Service: Gastroenterology;  Laterality: N/A;  . IRRIGATION AND DEBRIDEMENT FOOT Right 02/26/2016   Procedure: RIGHT 2ND TOE DEBRIDEMENT;  Surgeon: Samara Deist, DPM;  Location: [facility] OR[facility];  Service: Podiatry;  Laterality: Right;  . IRRIGATION AND DEBRIDEMENT FOOT Left 12/13/2018  Procedure: IRRIGATION AND DEBRIDEMENT FOOT;  Surgeon: Caroline More, DPM;  Location: [facility];  Service: Podiatry;  Laterality: Left;    Family History: Family  History  Problem Relation Age of Onset  . Breast cancer Cousin        maternal cousin  . Hypertension Mother   . Hyperthyroidism Mother   . Dementia Mother   . Prostate cancer Father   . Diabetes Maternal Grandmother   . Hypertension Maternal Grandmother   . Cancer Maternal Grandmother        unknown type  . Diabetes Maternal Grandfather   . Hypertension Maternal Grandfather   . Diabetes Paternal Grandmother   . Hypertension Paternal Grandmother   . Diabetes Paternal Grandfather   . Hypertension Paternal Grandfather     Social History: Social History   Tobacco Use  Smoking Status Current Every Day Smoker  . Packs/day: 1.00  . Years: 30.00  . Pack years: 30.00  . Types: Cigarettes  . Last attempt to quit: 05/02/2009  . Years since quitting: 11.2  Smokeless Tobacco Never Used   Social History   Substance and Sexual Activity  Alcohol Use No   Social History   Substance and Sexual Activity  Drug Use No    Allergies: No Known Allergies  Medications: Current Outpatient Medications  Medication Sig Dispense Refill  . acetaminophen (TYLENOL) 325 MG tablet Take 2 tablets (650 mg total) by mouth every 6 (six) hours as needed for mild pain.    . ARIPiprazole (ABILIFY) 15 MG tablet Take 1 tablet (15 mg total) by mouth daily. 30 tablet 0  . atorvastatin (LIPITOR) 40 MG tablet Take 1 tablet (40 mg total) by mouth daily. 30 tablet 1  . buPROPion (WELLBUTRIN XL) 300 MG 24 hr tablet TAKE 1 TABLET BY MOUTH EACH MORNING. 30 tablet 0  . colestipol (COLESTID) 1 g tablet TAKE (2) TABLETS BY MOUTH TWICE DAILY. 120 tablet 0  . furosemide (LASIX) 40 MG tablet Take 1 tablet (40 mg total) by mouth daily. 30 tablet 2  . gabapentin (NEURONTIN) 300 MG capsule Take 1 capsule (300 mg total) by mouth at bedtime. 30 capsule 1  . hydrochlorothiazide (HYDRODIURIL) 12.5 MG tablet Take 1 tablet (12.5 mg total) by mouth daily. 30 tablet 1  . insulin glargine (LANTUS SOLOSTAR) 100 UNIT/ML Solostar Pen  Inject 25 Units into the skin at bedtime. 15 mL   . Lancets (ONETOUCH DELICA PLUS [id number]) MISC USE TO TEST 3 TIMES DAILY. 100 each 0  . metFORMIN (GLUCOPHAGE) 1000 MG tablet Take 1 tablet (1,000 mg total) by mouth daily with breakfast. 30 tablet 1  . metoprolol succinate (TOPROL-XL) 50 MG 24 hr tablet Take 1 tablet (50 mg total) by mouth daily. Take with or immediately following a meal. 30 tablet 1  . ondansetron (ZOFRAN) 4 MG tablet Take 1 tablet (4 mg total) by mouth every 6 (six) hours. 12 tablet 0  . ONETOUCH VERIO test strip CHECK BLOOD SUGAR 3 TIMES DAILY. 100 strip 0  . traZODone (DESYREL) 50 MG tablet TAKE 1 TABLET BY MOUTH AT BEDTIME AS NEEDED FOR SLEEP. (Patient taking differently: at bedtime as needed.) 30 tablet 0  . ciprofloxacin (CIPRO) 500 MG tablet Take 1 tablet (500 mg total) by mouth 2 (two) times daily. 6 tablet 0  . mupirocin ointment (BACTROBAN) 2 % Apply 1 application topically 2 (two) times daily. (Patient not taking: Reported on 08/03/2020) 30 g 2  . pantoprazole (PROTONIX) 40 MG tablet Take 1 tablet (40 mg  total) by mouth 2 (two) times daily. (Patient not taking: Reported on 08/03/2020) 30 tablet 0   No current facility-administered medications for this visit.    Review of Systems: GENERAL: negative for malaise, night sweats HEENT: No changes in hearing or vision, no nose bleeds or other nasal problems. NECK: Negative for lumps, goiter, pain and significant neck swelling RESPIRATORY: Negative for cough, wheezing CARDIOVASCULAR: Negative for chest pain, leg swelling, palpitations, orthopnea GI: SEE HPI MUSCULOSKELETAL: Negative for joint pain or swelling, back pain, and muscle pain. SKIN: Negative for lesions, rash PSYCH: Negative for sleep disturbance, mood disorder and recent psychosocial stressors. HEMATOLOGY Negative for prolonged bleeding, bruising easily, and swollen nodes. ENDOCRINE: Negative for cold or heat intolerance, polyuria, polydipsia and goiter. NEURO:  negative for tremor, gait imbalance, syncope and seizures. The remainder of the review of systems is noncontributory.   Physical Exam: BP (!) 149/84 (BP Location: Left Arm, Patient Position: Sitting, Cuff Size: Large)   Pulse (!) 111   Temp (!) 97.5 F (36.4 C) (Oral)   Ht 5' 10.5" (1.791 m)   Wt 247 lb (112 kg)   BMI 34.94 kg/m  GENERAL: The patient is AO x3, in no acute distress. Has a rolleraide. HEENT: Head is normocephalic and atraumatic. EOMI are intact. Mouth is well hydrated and without lesions. NECK: Supple. No masses LUNGS: Clear to auscultation. No presence of rhonchi/wheezing/rales. Adequate chest expansion HEART: RRR, normal s1 and s2. ABDOMEN: Soft, nontender, no guarding, no peritoneal signs, and nondistended. BS +. No masses. EXTREMITIES: Without any cyanosis, clubbing, rash, lesions or edema. NEUROLOGIC: AOx3, no focal motor deficit. SKIN: no jaundice, no rashes  Imaging/Labs: as above  I personally reviewed and interpreted the available labs, imaging and endoscopic files.  Impression and Plan: [name] is a 55 y.o.  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  Patient has persisted with episodes of diarrhea of unclear source.  This could be related to post operatory changes, however as discussed in her previous appointment it would be important to evaluate this further with a colonoscopy.  In fact she is due for colorectal cancer surveillance after her most recent resection more than 1 year ago.  We also obtain random colonic biopsies to rule out microscopic colitis.  Since she has presented decline in her hemoglobin will also rule out any upper gastrointestinal losses with an EGD.  Patient understood and agreed.  I advised the patient that the use of  Colestid is relatively safe and she should take it 2 g every day to improve her symptoms of diarrhea.  She understood and agreed.  It is possible that she is also presenting some lactose intolerance, I advised her to avoid any dairy products.  - Schedule EGD and colonoscopy with small bowel and random colonic biopsies - Start Colestid 2 g every day - Avoid dairy products  All questions were answered.      Harvel Quale, MD Gastroenterology and Hepatology [facility]

## 2020-08-03 NOTE — Patient Instructions (Addendum)
Schedule EGD and colonoscopy Start Colestid 2 g every day Avoid dairy products

## 2020-08-06 ENCOUNTER — Ambulatory Visit (INDEPENDENT_AMBULATORY_CARE_PROVIDER_SITE_OTHER): Payer: Medicare Other | Admitting: *Deleted

## 2020-08-06 ENCOUNTER — Telehealth: Payer: Self-pay

## 2020-08-06 DIAGNOSIS — E1165 Type 2 diabetes mellitus with hyperglycemia: Secondary | ICD-10-CM

## 2020-08-06 DIAGNOSIS — E1149 Type 2 diabetes mellitus with other diabetic neurological complication: Secondary | ICD-10-CM

## 2020-08-06 DIAGNOSIS — I10 Essential (primary) hypertension: Secondary | ICD-10-CM

## 2020-08-06 DIAGNOSIS — IMO0002 Reserved for concepts with insufficient information to code with codable children: Secondary | ICD-10-CM

## 2020-08-06 NOTE — Telephone Encounter (Signed)
error 

## 2020-08-06 NOTE — Patient Instructions (Signed)
Visit Information  PATIENT GOALS: Goals Addressed            This Visit's Progress   . Monitor and Manage My Blood Sugar-Diabetes Type 2       Timeframe:  Long-Range Goal Priority:  High Start Date:           07/02/2020                  Expected End Date:   01/02/2021                    Follow Up Date 09/10/2020 .   . - check blood sugar at prescribed times . - check blood sugar if I feel it is too high or too low . - take the blood sugar log to all doctor visits . - take the blood sugar meter to all doctor visits . Follow up with podiatrist on 08/26/20 and Dr. Posey Pronto 08/26/20 . Follow low carb diet(be careful with bread, pasta, rice, sweets) . Try to exercise- walking is good . Start checking blood sugar and keeping a log, take log to primary doctor visit . Schedule eye exam   Why is this important?    Checking your blood sugar at home helps to keep it from getting very high or very low.   Writing the results in a diary or log helps the doctor know how to care for you.   Your blood sugar log should have the time, date and the results.   Also, write down the amount of insulin or other medicine that you take.   Other information, like what you ate, exercise done and how you were feeling, will also be helpful.     Notes:     . Track and Manage My Blood Pressure-Hypertension       Timeframe:  Long-Range Goal Priority:  High Start Date:        07/02/2020                     Expected End Date:     01/02/2021                  Follow Up Date 09/10/2020 .   . - check blood pressure daily . - write blood pressure results in a log or diary  . Continue to attend all scheduled provider appointments Dr. Posey Pronto and podiatrist both on 08/26/20 . Call provider office for new concerns, questions, or BP outside discussed parameters . You now have new BP cuff, please begin checking blood pressure daily and recording . Choose low sodium foods, avoid salty snacks such as chips.    Why is this  important?    You won't feel high blood pressure, but it can still hurt your blood vessels.   High blood pressure can cause heart or kidney problems. It can also cause a stroke.   Making lifestyle changes like losing a little weight or eating less salt will help.   Checking your blood pressure at home and at different times of the day can help to control blood pressure.   If the doctor prescribes medicine remember to take it the way the doctor ordered.   Call the office if you cannot afford the medicine or if there are questions about it.     Notes:        Patient verbalizes understanding of instructions provided today and agrees to view in [place].   Telephone follow up appointment  with care management team member scheduled for: 09/10/2020  [name] [facility], BSN RN Case Manager [facility] [phone]  Diabetes Mellitus and Nutrition, Adult When you have diabetes, or diabetes mellitus, it is very important to have healthy eating habits because your blood sugar (glucose) levels are greatly affected by what you eat and drink. Eating healthy foods in the right amounts, at about the same times every day, can help you:  Control your blood glucose.  Lower your risk of heart disease.  Improve your blood pressure.  Reach or maintain a healthy weight. What can affect my meal plan? Every person with diabetes is different, and each person has different needs for a meal plan. Your health care provider may recommend that you work with a dietitian to make a meal plan that is best for you. Your meal plan may vary depending on factors such as:  The calories you need.  The medicines you take.  Your weight.  Your blood glucose, blood pressure, and cholesterol levels.  Your activity level.  Other health conditions you have, such as heart or kidney disease. How do carbohydrates affect me? Carbohydrates, also called carbs, affect your blood glucose level more than any  other type of food. Eating carbs naturally raises the amount of glucose in your blood. Carb counting is a method for keeping track of how many carbs you eat. Counting carbs is important to keep your blood glucose at a healthy level, especially if you use insulin or take certain oral diabetes medicines. It is important to know how many carbs you can safely have in each meal. This is different for every person. Your dietitian can help you calculate how many carbs you should have at each meal and for each snack. How does alcohol affect me? Alcohol can cause a sudden decrease in blood glucose (hypoglycemia), especially if you use insulin or take certain oral diabetes medicines. Hypoglycemia can be a life-threatening condition. Symptoms of hypoglycemia, such as sleepiness, dizziness, and confusion, are similar to symptoms of having too much alcohol.  Do not drink alcohol if: ? Your health care provider tells you not to drink. ? You are pregnant, may be pregnant, or are planning to become pregnant.  If you drink alcohol: ? Do not drink on an empty stomach. ? Limit how much you use to:  0-1 drink a day for women.  0-2 drinks a day for men. ? Be aware of how much alcohol is in your drink. In the [place] one drink equals one 12 oz bottle of beer (355 mL), one 5 oz glass of wine (148 mL), or one 1 oz glass of hard liquor (44 mL). ? Keep yourself hydrated with water, diet soda, or unsweetened iced tea.  Keep in mind that regular soda, juice, and other mixers may contain a lot of sugar and must be counted as carbs. What are tips for following this plan? Reading food labels  Start by checking the serving size on the "Nutrition Facts" label of packaged foods and drinks. The amount of calories, carbs, fats, and other nutrients listed on the label is based on one serving of the item. Many items contain more than one serving per package.  Check the total grams (g) of carbs in one serving. You can calculate  the number of servings of carbs in one serving by dividing the total carbs by 15. For example, if a food has 30 g of total carbs per serving, it would be equal to 2 servings  of carbs.  Check the number of grams (g) of saturated fats and trans fats in one serving. Choose foods that have a low amount or none of these fats.  Check the number of milligrams (mg) of salt (sodium) in one serving. Most people should limit total sodium intake to less than 2,300 mg per day.  Always check the nutrition information of foods labeled as "low-fat" or "nonfat." These foods may be higher in added sugar or refined carbs and should be avoided.  Talk to your dietitian to identify your daily goals for nutrients listed on the label. Shopping  Avoid buying canned, pre-made, or processed foods. These foods tend to be high in fat, sodium, and added sugar.  Shop around the outside edge of the grocery store. This is where you will most often find fresh fruits and vegetables, bulk grains, fresh meats, and fresh dairy. Cooking  Use low-heat cooking methods, such as baking, instead of high-heat cooking methods like deep frying.  Cook using healthy oils, such as olive, canola, or sunflower oil.  Avoid cooking with butter, cream, or high-fat meats. Meal planning  Eat meals and snacks regularly, preferably at the same times every day. Avoid going long periods of time without eating.  Eat foods that are high in fiber, such as fresh fruits, vegetables, beans, and whole grains. Talk with your dietitian about how many servings of carbs you can eat at each meal.  Eat 4-6 oz (112-168 g) of lean protein each day, such as lean meat, chicken, fish, eggs, or tofu. One ounce (oz) of lean protein is equal to: ? 1 oz (28 g) of meat, chicken, or fish. ? 1 egg. ?  cup (62 g) of tofu.  Eat some foods each day that contain healthy fats, such as avocado, nuts, seeds, and fish.   What foods should I eat? Fruits Berries. Apples.  Oranges. Peaches. Apricots. Plums. Grapes. Mango. Papaya. Pomegranate. Kiwi. Cherries. Vegetables Lettuce. Spinach. Leafy greens, including kale, chard, collard greens, and mustard greens. Beets. Cauliflower. Cabbage. Broccoli. Carrots. Green beans. Tomatoes. Peppers. Onions. Cucumbers. Brussels sprouts. Grains Whole grains, such as whole-wheat or whole-grain bread, crackers, tortillas, cereal, and pasta. Unsweetened oatmeal. Quinoa. Brown or wild rice. Meats and other proteins Seafood. Poultry without skin. Lean cuts of poultry and beef. Tofu. Nuts. Seeds. Dairy Low-fat or fat-free dairy products such as milk, yogurt, and cheese. The items listed above may not be a complete list of foods and beverages you can eat. Contact a dietitian for more information. What foods should I avoid? Fruits Fruits canned with syrup. Vegetables Canned vegetables. Frozen vegetables with butter or cream sauce. Grains Refined white flour and flour products such as bread, pasta, snack foods, and cereals. Avoid all processed foods. Meats and other proteins Fatty cuts of meat. Poultry with skin. Breaded or fried meats. Processed meat. Avoid saturated fats. Dairy Full-fat yogurt, cheese, or milk. Beverages Sweetened drinks, such as soda or iced tea. The items listed above may not be a complete list of foods and beverages you should avoid. Contact a dietitian for more information. Questions to ask a health care provider  Do I need to meet with a diabetes educator?  Do I need to meet with a dietitian?  What number can I call if I have questions?  When are the best times to check my blood glucose? Where to find more information:  American Diabetes Association: diabetes.org  Academy of Nutrition and Dietetics: www.eatright.[employer] of Diabetes and Digestive and  Kidney Diseases: [employer]  Association of Diabetes Care and Education Specialists: www.diabeteseducator.org Summary  It  is important to have healthy eating habits because your blood sugar (glucose) levels are greatly affected by what you eat and drink.  A healthy meal plan will help you control your blood glucose and maintain a healthy lifestyle.  Your health care provider may recommend that you work with a dietitian to make a meal plan that is best for you.  Keep in mind that carbohydrates (carbs) and alcohol have immediate effects on your blood glucose levels. It is important to count carbs and to use alcohol carefully. This information is not intended to replace advice given to you by your health care provider. Make sure you discuss any questions you have with your health care provider. Document Revised: 03/26/2019 Document Reviewed: 03/26/2019 Elsevier Patient Education  2021 [employer].

## 2020-08-06 NOTE — Telephone Encounter (Signed)
[facility] form received  Copied Noted Sle[place]ed

## 2020-08-06 NOTE — Chronic Care Management (AMB) (Signed)
Chronic Care Management   CCM RN Visit Note  08/06/2020 Name: [name] MRN: [id number] DOB: 03-30-1966  Subjective: [name] is a 55 y.o. year old female who is a primary care patient of Lindell Spar, MD. The care management team was consulted for assistance with disease management and care coordination needs.    Engaged with patient by telephone for follow up visit in response to provider referral for case management and/or care coordination services.   Consent to Services:  The patient was given information about Chronic Care Management services, agreed to services, and gave verbal consent prior to initiation of services.  Please see initial visit note for detailed documentation.   Patient agreed to services and verbal consent obtained.   Assessment: Review of patient past medical history, allergies, medications, health status, including review of consultants reports, laboratory and other test data, was performed as part of comprehensive evaluation and provision of chronic care management services.   SDOH (Social Determinants of Health) assessments and interventions performed:    CCM Care Plan  No Known Allergies  Outpatient Encounter Medications as of 08/06/2020  Medication Sig Note  . acetaminophen (TYLENOL) 325 MG tablet Take 2 tablets (650 mg total) by mouth every 6 (six) hours as needed for mild pain.   . ARIPiprazole (ABILIFY) 15 MG tablet Take 1 tablet (15 mg total) by mouth daily.   [name] atorvastatin (LIPITOR) 40 MG tablet Take 1 tablet (40 mg total) by mouth daily.   [name] buPROPion (WELLBUTRIN XL) 300 MG 24 hr tablet TAKE 1 TABLET BY MOUTH EACH MORNING. (Patient taking differently: Take 300 mg by mouth daily.)   . colestipol (COLESTID) 1 g tablet TAKE (2) TABLETS BY MOUTH TWICE DAILY. (Patient taking differently: Take 1 g by mouth daily.)   . furosemide (LASIX) 40 MG tablet Take 1 tablet (40 mg total) by mouth daily.   [name] gabapentin (NEURONTIN) 300 MG  capsule Take 1 capsule (300 mg total) by mouth at bedtime.   . hydrochlorothiazide (HYDRODIURIL) 12.5 MG tablet Take 1 tablet (12.5 mg total) by mouth daily.   . insulin glargine (LANTUS SOLOSTAR) 100 UNIT/ML Solostar Pen Inject 25 Units into the skin at bedtime.   . Lancets (ONETOUCH DELICA PLUS [id number]) MISC USE TO TEST 3 TIMES DAILY.   . metFORMIN (GLUCOPHAGE) 1000 MG tablet Take 1 tablet (1,000 mg total) by mouth daily with breakfast.   . metoprolol succinate (TOPROL-XL) 50 MG 24 hr tablet Take 1 tablet (50 mg total) by mouth daily. Take with or immediately following a meal.   . mupirocin ointment (BACTROBAN) 2 % Apply 1 application topically 2 (two) times daily. (Patient not taking: Reported on 08/04/2020)   . ondansetron (ZOFRAN) 4 MG tablet Take 1 tablet (4 mg total) by mouth every 6 (six) hours. (Patient taking differently: Take 4 mg by mouth every 8 (eight) hours as needed for nausea or vomiting.)   . ONETOUCH VERIO test strip CHECK BLOOD SUGAR 3 TIMES DAILY.   . pantoprazole (PROTONIX) 40 MG tablet Take 1 tablet (40 mg total) by mouth 2 (two) times daily. (Patient taking differently: Take 40 mg by mouth daily.)   . polyethylene glycol-electrolytes (TRILYTE) 420 g solution Take 4,000 mLs by mouth as directed.   . traZODone (DESYREL) 50 MG tablet TAKE 1 TABLET BY MOUTH AT BEDTIME AS NEEDED FOR SLEEP. (Patient taking differently: Take 50 mg by mouth at bedtime as needed for sleep.) 08/03/2020: As needed per patient, has not needed lately falls  asleep with out.   No facility-administered encounter medications on file as of 08/06/2020.    Patient Active Problem List   Diagnosis Date Noted  . Lactose intolerance 08/03/2020  . Charcot's joint of foot, left   . Slurred speech 05/09/2020  . Schizophrenia ([place]) 02/20/2020  . Chronic diarrhea 11/25/2019  . History of colon cancer 11/25/2019  . Iron deficiency anemia 07/25/2019  . S/P partial colectomy 07/17/2019  . Adenocarcinoma of sigmoid  colon ([place]) 05/28/2019  . Anemia   . Neuropathic foot ulcer, left, with fat layer exposed ([place]) 02/05/2019  . Morbid obesity ([place]) 05/10/2017  . Long-term use of high-risk medication 05/10/2017  . Hyperlipidemia associated with type 2 diabetes mellitus ([place]) 06/27/2016  . Hammer toe of second toe of right foot 04/20/2016  . Essential hypertension 06/29/2015  . Schizoaffective disorder ([place]) 06/29/2015  . DM (diabetes mellitus), type 2, 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[place]) 27/07/5007  . Polyneuropathy 04/02/2014    Conditions to be addressed/monitored:CAD and DMII  Care Plan : RNCM: Diabetes Type 2 (Adult)  Updates made by Kassie Mends, RN since 08/06/2020 12:00 AM    Problem: RNCM: Glycemic Management (Diabetes, Type 2)   Priority: High    Long-Range Goal: RNCM: Glycemic Management Optimized   Start Date: 07/02/2020  Expected End Date: 01/02/2021  This Visit's Progress: Not on track  Recent Progress: On track  Priority: High  Note:   Objective:  Lab Results  Component Value Date   HGBA1C 10.1 (H) 05/08/2020 .   Lab Results  Component Value Date   CREATININE 1.09 (H) 05/11/2020   CREATININE 1.34 (H) 05/10/2020   CREATININE 1.95 (H) 05/09/2020 .   [name] No results found for: EGFR Current Barriers:  [name] Knowledge Deficits related to basic Diabetes pathophysiology and self care/management- per MD note, pt needs to schedule eye exam, continue to see psychiatrist at [facility], patient did see podiatrist and has follow up scheduled. . Knowledge Deficits related to medications used for management of diabetes . Patient has new glucometer One Touch Verio but is not checking blood sugar, states she will start checking and keeping a log . 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 Cognitive Deficits . Limited Social Support- patient is seeing psychiatrist at [facility] . Unable to independently manage DM2- CBG - has not been checking, verbalizes knows she is supposed to  check . Does not adhere to provider recommendations re: take medications, keep appointments, adhere to prescribed diet- pt reports she is eating better with improved appetite (eating tuna, chicken, vegetables) . Patient is trying to do better with attending all scheduled appointments . Does not adhere to prescribed medication regimen- pt has all medications, finished antibiotic and reports UTI resolved . Lacks social connections . Unable to perform IADLs independently . Does not always maintain contact with provider office . Does not contact provider office for questions/concerns Case Manager Clinical Goal(s):  [name] Collaboration with Dr.Kutwik Posey Pronto regarding development and update of comprehensive plan of care as evidenced by provider attestation and co-signature . Inter-disciplinary care team collaboration (see longitudinal plan of care) . Over the next 120 days, patient will demonstrate improved adherence to prescribed treatment plan for diabetes self care/management as evidenced by:  . daily monitoring and recording of CBG  . adherence to ADA/ carb modified diet . adherence to prescribed medication regimen Interventions:  . Provided education to patient about basic DM disease process . Reviewed medications with patient and discussed importance of medication adherence . Discussed plans with patient for  ongoing care management follow up and provided patient with direct contact information for care management team- [phone] . Reviewed with patient signs/ symptoms hypo and hyperglycemia and importance of correct treatment . Reviewed scheduled/upcoming provider appointments including: Dr. Posey Pronto 08/26/20 and podiatrist 08/26/20, colonoscopy scheduled for 08/11/20 at [place] with patient importance of checking cbg TID and record . Sent diabetes education via My Chart . Assessed that patient has glucometer One Touch Verio and ask patient to check CBG as prescribed (she has  stopped checking CBG) . Review of patient status, including review of consultants reports, relevant laboratory and other test results, and medications completed. Patient Goals/Self-Care Activities . Over the next 120 days, patient will:  Self administers oral medications as prescribed Self administers insulin as prescribed Attends all scheduled provider appointments Checks blood sugars as prescribed and utilize hyper and hypoglycemia protocol as needed . Adheres to prescribed ADA/ low carb diet . Follow up with podiatrist on 08/26/20 and Dr. Posey Pronto 08/26/20 . Follow low carb diet(be careful with bread, pasta, rice, sweets) . Try to exercise- walking is good . Start checking blood sugar and keeping a log, take log to primary doctor visit . Schedule eye exam Follow Up Plan: Telephone follow up appointment with care management team member scheduled for:  09/10/2020   Care Plan : RNCM: Hypertension (Adult)  Updates made by Kassie Mends, RN since 08/06/2020 12:00 AM    Problem: RNCM: Hypertension (Hypertension)   Priority: Medium    Goal: RNCM: Hypertension Monitored   Start Date: 07/02/2020  Expected End Date: 01/02/2021  This Visit's Progress: Not on track  Priority: Medium  Note:   Objective:  . Last practice recorded BP readings:  BP Readings from Last 3 Encounters:  05/11/20 (!) 156/74  04/17/20 134/76  03/04/20 (!) 170/80 .   [name] Most recent eGFR/CrCl: No results found for: EGFR  No components found for: CRCL Current Barriers:  [name] Knowledge Deficits related to basic understanding of hypertension pathophysiology and self care management . Knowledge Deficits related to understanding of medications prescribed for management of hypertension . Non-adherence to scheduled provider appointments- client has been trying to do better with this and has been attending recent appointments . Transportation barriers . Cognitive Deficits . Limited Social Support- social worker involved . Museum/gallery exhibitions officer.  . Unable to independently management of HTN- now has new BP cuff but is not checking blood pressure, states she is able, just has not been doing  . Does not adhere to provider recommendations re: dietary restrictions, medications management and lifestyle changes  . Does not always attend all scheduled provider appointments . Does not always adhere to prescribed medication regimen- reports having all medications today . Lacks social connections . Unable to perform IADLs independently . Does not always maintain contact with provider office- states she is trying to do better with this . Does not contact provider office for questions/concerns Case Manager Clinical Goal(s):  [name] Over the next 120 days, patient will verbalize understanding of plan for hypertension management . Over the next 120 days, patient will attend all scheduled medical appointments: pt to follow up 08/26/2020 Dr. Posey Pronto and podiatrist, colonoscopy scheduled for 08/11/20 at [facility] . Over the next 120 days, patient will demonstrate improved adherence to prescribed treatment plan for hypertension as evidenced by taking all medications as prescribed, monitoring and recording blood pressure as directed, adhering to low sodium/DASH diet . Over the next 120 days, patient will demonstrate improved health management  independence as evidenced by checking blood pressure as directed and notifying PCP if SBP>160 or DBP > 90, taking all medications as prescribe, and adhering to a low sodium diet as discussed. . Over the next 120 days, patient will verbalize basic understanding of hypertension disease process and self health management plan as evidenced by compliance with medications, diet, and working with CCM team to maintain health and well being.  Interventions:  . Collaboration with Dr. Ihor Dow regarding development and update of comprehensive plan of care as evidenced by provider attestation and  co-signature . Inter-disciplinary care team collaboration (see longitudinal plan of care) . Evaluation of current treatment plan related to hypertension self management and patient's adherence to plan as established by provider. . Provided education to patient re: pt has new blood pressure cuff and importance of checking BP daily and recording, DASH diet, complications of uncontrolled blood pressure and calling PCP for readings outside normal limits . Reviewed choosing low sodium foods, avoid salty snacks . Reviewed medications with patient and discussed importance of compliance . Discussed plans with patient for ongoing care management follow up . Reviewed scheduled/upcoming provider appointments including: Dr. Posey Pronto 4/27 and podiatrist. Patient Goals/Self-Care Activities . Over the next 120 days, patient will:  - Self administers medications as prescribed . Continue to attend all scheduled provider appointments Dr. Posey Pronto and podiatrist both on 08/26/20 . Call provider office for new concerns, questions, or BP outside discussed parameters . You now have new BP cuff, please begin checking blood pressure daily and recording . Choose low sodium foods, avoid salty snacks such as chips. Follow Up Plan: Telephone follow up appointment with care management team member scheduled for:  09/10/2020     Plan:Telephone follow up appointment with care management team member scheduled for:  09/10/2020  Jacqlyn Larsen [facility], BSN RN Case Manager [facility] [phone]

## 2020-08-07 ENCOUNTER — Telehealth: Payer: Self-pay | Admitting: *Deleted

## 2020-08-07 NOTE — Chronic Care Management (AMB) (Signed)
  Care Management   Note  08/07/2020 Name: LAVETA GILKEY MRN: [id number] DOB: [birth year]  GENNAVIEVE HUQ is a 55 y.o. year old female who is a primary care patient of Lindell Spar, MD and is actively engaged with the care management team. I reached out to [name] by phone today to assist with re-scheduling a follow up visit with the Licensed Clinical Social Worker  Follow up plan: Unsuccessful telephone outreach attempt made. The care management team will reach out to the patient again over the next 7-14 days. If patient returns call to provider office, please advise to call [place] at [phone].  [place] Management

## 2020-08-10 ENCOUNTER — Other Ambulatory Visit (HOSPITAL_COMMUNITY)
Admission: RE | Admit: 2020-08-10 | Discharge: 2020-08-10 | Disposition: A | Discharge status: Home / Self Care | Payer: Medicare Other | Source: Ambulatory Visit | Attending: Gastroenterology | Admitting: Gastroenterology

## 2020-08-10 NOTE — OR Nursing (Signed)
Have attempted to call patient multiple times to give procedure time and unable to reach her. [name] at the office has attempted to call as well without success. Patient did not show up for covid test today. Procedure cancelled. [name] aware.

## 2020-08-10 NOTE — Progress Notes (Signed)
Called patient she had trouble getting the right transportation. Spoke with patient, she asked them to send a bus or [place] with a lift, they sent one without. Pt. Is unable to get up high steps. I explained to her she would need to call her Dr.'s office and have them reschedule her procedure and Covid appointments,since she couldn't come today. Patient said she'd call. Her procedure is currently scheduled for tomorrow.

## 2020-08-11 ENCOUNTER — Ambulatory Visit (HOSPITAL_COMMUNITY): Admission: RE | Admit: 2020-08-11 | Payer: Medicare Other | Source: Home / Self Care | Admitting: Gastroenterology

## 2020-08-11 ENCOUNTER — Encounter (HOSPITAL_COMMUNITY): Admission: RE | Payer: Self-pay | Source: Home / Self Care

## 2020-08-11 SURGERY — COLONOSCOPY WITH PROPOFOL
Anesthesia: Monitor Anesthesia Care

## 2020-08-17 ENCOUNTER — Ambulatory Visit: Payer: Medicare Other | Admitting: Nurse Practitioner

## 2020-08-18 NOTE — Chronic Care Management (AMB) (Signed)
  Care Management   Note  08/18/2020 Name: [name] MRN: [id number] DOB: [birth year]  [name] is a 55 y.o. year old female who is a primary care patient of Lindell Spar, MD and is actively engaged with the care management team. I reached out to [name] by phone today to assist with re-scheduling a follow up visit with the Licensed Clinical Social Worker  Follow up plan: A second unsuccessful telephone outreach attempt made. The care management team will reach out to the patient again over the next 7 days. If patient returns call to provider office, please advise to call [place] at [phone].  [place] Management

## 2020-08-20 NOTE — Chronic Care Management (AMB) (Signed)
  Care Management   Note  08/20/2020 Name: [name] MRN: [id number] DOB: 1965-11-25  [name] is a 55 y.o. year old female who is a primary care patient of Lindell Spar, MD and is actively engaged with the care management team. I reached out to [name] by phone today to assist with re-scheduling a follow up visit with the Licensed Clinical Education officer, museum.  Follow up plan: Telephone appointment with care management team member scheduled for:09/03/2020  Tayven Renteria  Care Guide, Embedded Care Coordination [place]  Care Management

## 2020-08-26 ENCOUNTER — Ambulatory Visit: Payer: Medicare Other | Admitting: Internal Medicine

## 2020-08-26 ENCOUNTER — Ambulatory Visit: Payer: Medicare HMO | Admitting: Internal Medicine

## 2020-08-26 ENCOUNTER — Ambulatory Visit: Payer: Medicare Other | Admitting: Podiatry

## 2020-08-27 ENCOUNTER — Other Ambulatory Visit: Payer: Self-pay | Admitting: Podiatry

## 2020-08-27 NOTE — Telephone Encounter (Signed)
Please advise 

## 2020-09-03 ENCOUNTER — Ambulatory Visit (INDEPENDENT_AMBULATORY_CARE_PROVIDER_SITE_OTHER): Payer: Medicare Other | Admitting: *Deleted

## 2020-09-03 DIAGNOSIS — F2089 Other schizophrenia: Secondary | ICD-10-CM

## 2020-09-03 DIAGNOSIS — E1165 Type 2 diabetes mellitus with hyperglycemia: Secondary | ICD-10-CM

## 2020-09-03 DIAGNOSIS — IMO0002 Reserved for concepts with insufficient information to code with codable children: Secondary | ICD-10-CM

## 2020-09-03 DIAGNOSIS — F251 Schizoaffective disorder, depressive type: Secondary | ICD-10-CM

## 2020-09-04 NOTE — Patient Instructions (Signed)
Visit Information  PATIENT GOALS: Goals Addressed            This Visit's Progress   . Become more independent in managment of my health and mental health       Timeframe:  Long-Range Goal Priority:  Medium Start Date: 06/25/2020                        Expected End Date: 10/29/2020                  Follow Up Date 09/29/2020   - return to [facility]  -seek and schedule appointment with new counselor- visit [facility] to see if they accept your insurance - start a calendar notebook to organize all appointments - start or continue a personal journal - talk about feelings with a friend, family or spiritual advisor - practice positive thinking and self-talk    Why is this important?    When you are stressed, down or upset, your body reacts too.   For example, your blood pressure may get higher; you may have a headache or stomachache.   When your emotions get the best of you, your body's ability to fight off cold and flu gets weak.   These steps will help you manage your emotions.          The patient verbalized understanding of instructions, educational materials, and care plan provided today and declined offer to receive copy of patient instructions, educational materials, and care plan.   Telephone follow up appointment with care management team member scheduled for:09/29/2020  Eduard Clos MSW, LCSW Licensed Clinical Social Education officer, [facility] [phone]

## 2020-09-04 NOTE — Chronic Care Management (AMB) (Signed)
Chronic Care Management    Clinical Social Work Note  09/04/2020 Name: [name] MRN: [id number] DOB: 02-11-1966  [name] is a 55 y.o. year old female who is a primary care patient of Lindell Spar, MD. The CCM team was consulted to assist the patient with chronic disease management and/or care coordination needs related to: [place] and Resources Engaged with patient by telephone for follow up visit in response to provider referral for social work chronic care management and care coordination services.  Consent to Services:  The patient was given information about Chronic Care Management services, agreed to services, and gave verbal consent prior to initiation of services.  Please see initial visit note for detailed documentation.  Patient agreed to services and consent obtained.  Assessment: Review of patient past medical history, allergies, medications, and health status, including review of relevant consultants reports was performed today as part of a comprehensive evaluation and provision of chronic care management and care coordination services.    SDOH (Social Determinants of Health) assessments and interventions performed:   Advanced Directives Status: Not addressed in this encounter.  CCM Care Plan  No Known Allergies  Outpatient Encounter Medications as of 09/03/2020  Medication Sig Note  . ARIPiprazole (ABILIFY) 15 MG tablet Take 1 tablet (15 mg total) by mouth daily. 09/04/2020: Pt reports she forgets to take this medication a few nights per week; before going to bed. Suggested she place it by her bedside, in bathroom or somewhere that might trigger reminder at bedtime or try taking it at another time- pt reports she had difficulty when taking all meds at one time in the past- advised pt she may also want to discuss with Provider or Pharmacist.   . acetaminophen (TYLENOL) 325 MG tablet Take 2 tablets (650 mg total) by mouth every 6 (six) hours as  needed for mild pain.   [name] atorvastatin (LIPITOR) 40 MG tablet Take 1 tablet (40 mg total) by mouth daily.   [name] buPROPion (WELLBUTRIN XL) 300 MG 24 hr tablet TAKE 1 TABLET BY MOUTH EACH MORNING. (Patient taking differently: Take 300 mg by mouth daily.)   . colestipol (COLESTID) 1 g tablet TAKE (2) TABLETS BY MOUTH TWICE DAILY. (Patient taking differently: Take 1 g by mouth daily.)   . furosemide (LASIX) 40 MG tablet Take 1 tablet (40 mg total) by mouth daily.   [name] gabapentin (NEURONTIN) 300 MG capsule Take 1 capsule (300 mg total) by mouth at bedtime.   . hydrochlorothiazide (HYDRODIURIL) 12.5 MG tablet Take 1 tablet (12.5 mg total) by mouth daily.   . insulin glargine (LANTUS SOLOSTAR) 100 UNIT/ML Solostar Pen Inject 25 Units into the skin at bedtime.   . Lancets (ONETOUCH DELICA PLUS [id number]) MISC USE TO TEST 3 TIMES DAILY.   . metFORMIN (GLUCOPHAGE) 1000 MG tablet Take 1 tablet (1,000 mg total) by mouth daily with breakfast.   . metoprolol succinate (TOPROL-XL) 50 MG 24 hr tablet Take 1 tablet (50 mg total) by mouth daily. Take with or immediately following a meal.   . mupirocin ointment (BACTROBAN) 2 % APPLY TO AFFECTED AREA TWICE DAILY   . ondansetron (ZOFRAN) 4 MG tablet Take 1 tablet (4 mg total) by mouth every 6 (six) hours. (Patient taking differently: Take 4 mg by mouth every 8 (eight) hours as needed for nausea or vomiting.)   . ONETOUCH VERIO test strip CHECK BLOOD SUGAR 3 TIMES DAILY.   . pantoprazole (PROTONIX) 40 MG tablet Take 1 tablet (  40 mg total) by mouth 2 (two) times daily. (Patient taking differently: Take 40 mg by mouth daily.)   . polyethylene glycol-electrolytes (TRILYTE) 420 g solution Take 4,000 mLs by mouth as directed.   . traZODone (DESYREL) 50 MG tablet TAKE 1 TABLET BY MOUTH AT BEDTIME AS NEEDED FOR SLEEP. (Patient taking differently: Take 50 mg by mouth at bedtime as needed for sleep.) 08/03/2020: As needed per patient, has not needed lately falls asleep with out.   No  facility-administered encounter medications on file as of 09/03/2020.    Patient Active Problem List   Diagnosis Date Noted  . Lactose intolerance 08/03/2020  . Charcot's joint of foot, left   . Slurred speech 05/09/2020  . Schizophrenia ([place]) 02/20/2020  . Chronic diarrhea 11/25/2019  . History of colon cancer 11/25/2019  . Iron deficiency anemia 07/25/2019  . S/P partial colectomy 07/17/2019  . 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[place]) 05/28/2019  . Anemia   . Neuropathic foot ulcer, left, with fat layer exposed ([place]) 02/05/2019  . Morbid obesity ([place]) 05/10/2017  . Long-term use of high-risk medication 05/10/2017  . Hyperlipidemia associated with type 2 diabetes mellitus ([place]) 06/27/2016  . Hammer toe of second toe of right foot 04/20/2016  . Essential hypertension 06/29/2015  . Schizoaffective disorder ([place]) 06/29/2015  . DM (diabetes mellitus), type 2, 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[place]) 84/69/6295  . Polyneuropathy 04/02/2014    Conditions to be addressed/monitored: Anxiety, Depression and Bipolar Disorder; Mental Health Concerns   Care Plan : LCSW Plan of Care  Updates made by Deirdre Peer, LCSW since 09/04/2020 12:00 AM    Problem: Coping Skills (General Plan of Care)     Long-Range Goal: Coping Skills Enhanced   Start Date: 06/25/2020  Expected End Date: 10/29/2020  This Visit's Progress: Not on track  Recent Progress: On track  Priority: High  Note:   Current Barriers:  . Chronic Mental Health needs related to Schizoaffective d/o, depression . Mental Health Concerns , Limited access to caregiver, and Cognitive Deficits . Suicidal Ideation/Homicidal Ideation: No  Clinical Social Work Goal(s):  . patient will work with SW bi-weekly by telephone or in person to reduce or manage symptoms related to mental health needs . Over the next 30 days, patient will work with SW to address concerns related to managing mental health needs, organizing appointments and  setting goals for further independence.  Interventions: Pt reports she has not followed up on the counseling but plans to see if the clinic next door to her home accepts her insurance; pt plans to do this today. Stressed to pt the importance of her getting established with a counselor and/or to return to [facility]. Pt also reports she has been missing some of her Abilify doses at night; due to forgetting to take before bed.  CSW again stressed to pt the importance of taking this regularly as prescribed to maintain its effectiveness and for her to get the results and feel better from it. CSW discussed possible solutions for her to try to aide in her remembering to take this before going to sleep; keep by bedside, note on mirror in bathroom, etc.  Also suggested she discuss with her Provider or Pharmacist.  . Patient interviewed and appropriate assessments performed: brief mental health assessment . Patient interviewed and appropriate assessments performed . Discussed plans with patient for ongoing care management follow up and provided patient with direct contact information for care management team . Advised patient to schedule ongoing counseling appointments . Motivational  Interviewing, Solution-Focused Strategies, and Emotional/Supportive Counseling  Patient Self Care Activities:  . Self administers medications as prescribed, Attends all scheduled provider appointments, Independent living, and Motivation for treatment  Patient Coping Strengths:  . Family . Hopefulness  Patient Self Care Deficits:  . Lacks social connections . Does not maintain contact with provider office  . Patient Goals:  [name] - return to [facility]  . -seek and schedule appointment with new counselor- visit clinic nextdoor mentioned to see if they accept your insurance . - start a calendar notebook to organize all appointments . - start or continue a personal journal . - talk about feelings with a friend, family or spiritual  advisor . - practice positive thinking and self-talk   . Follow Up Plan: Appointment scheduled for SW follow up with client by phone on 09/29/2020 , Client will call and schedule virtual counseling appointments, and utilize calendar to be mailed to her for organizing all appointments and notes/info related to her health and mental health.          Follow Up Plan: Appointment scheduled for SW follow up with client by phone on: 09/29/2020     Eduard Clos MSW, LCSW Licensed Clinical Social Education officer, environmental Primary Care [phone]

## 2020-09-08 ENCOUNTER — Other Ambulatory Visit: Payer: Self-pay | Admitting: *Deleted

## 2020-09-08 DIAGNOSIS — F251 Schizoaffective disorder, depressive type: Secondary | ICD-10-CM

## 2020-09-08 MED ORDER — ARIPIPRAZOLE 15 MG PO TABS: 15.0000 mg | ORAL_TABLET | Freq: Every day | ORAL | 0 refills | Status: DC

## 2020-09-10 ENCOUNTER — Telehealth: Payer: Medicare Other

## 2020-09-10 ENCOUNTER — Telehealth: Payer: Self-pay | Admitting: *Deleted

## 2020-09-10 NOTE — Telephone Encounter (Signed)
  Care Management   Follow Up Note   09/10/2020 Name: [name] MRN: [id number] DOB: 1965/05/11   Referred by: Lindell Spar, MD Reason for referral : Chronic Care Management (HTN, DM2)   An unsuccessful telephone outreach was attempted today. The patient was referred to the case management team for assistance with care management and care coordination.   Follow Up Plan: Telephone follow up appointment with care management team member scheduled for:   Will be rescheduled by care guide  Jacqlyn Larsen [facility], BSN RN Case Manager [facility] [phone]   [facility]

## 2020-09-14 ENCOUNTER — Telehealth: Payer: Self-pay | Admitting: *Deleted

## 2020-09-14 NOTE — Chronic Care Management (AMB) (Signed)
  Care Management   Note  09/14/2020 Name: [name] MRN: [id number] DOB: January 09, 1966  [name] is a 55 y.o. year old female who is a primary care patient of Lindell Spar, MD and is actively engaged with the care management team. I reached out to [name] by phone today to assist with re-scheduling a follow up visit with the RN Case Manager.  Follow up plan: Unsuccessful telephone outreach attempt made. A HIPAA compliant phone message was left for the patient providing contact information and requesting a return call. The care management team will reach out to the patient again over the next 7 days. If patient returns call to provider office, please advise to call [place] at [phone].  [place] Management

## 2020-09-16 ENCOUNTER — Telehealth: Payer: Self-pay | Admitting: *Deleted

## 2020-09-16 ENCOUNTER — Telehealth: Payer: Self-pay

## 2020-09-16 NOTE — Telephone Encounter (Signed)
Pt states she forgot to mention she was in a lot of pain and needs some percocet.

## 2020-09-16 NOTE — Telephone Encounter (Signed)
"  My foot swollen and it feels warm when I touch it.  It's not red.  Can he call me in an antibiotic?  It could be infection in the foot.  I don't know."  He's probably going to want you to come in for an appointment.  "I can't come all the way to [facility].  Should I get an appointment with my primary care doctor or can he call me in a prescription?"

## 2020-09-17 ENCOUNTER — Telehealth: Payer: Self-pay

## 2020-09-17 NOTE — Telephone Encounter (Signed)
No we cannot if she has home health coming out they can call us or bring a specimen by the office but it cannot be over 4 hours old she may also get family member to bring urine by and we do a virtual

## 2020-09-17 NOTE — Telephone Encounter (Signed)
Called patient, patient will try to get a family member to drop.

## 2020-09-17 NOTE — Telephone Encounter (Signed)
It may be Charcot and not an infection (she doesn't have a wound). She should come in for an X-ray

## 2020-09-17 NOTE — Telephone Encounter (Signed)
Patient called asking can the dr send a nurse to home check for uti cloudy and burning.  She has no way to get here to the office it will take her 3 days to get here. Call back # [phone] or [phone]

## 2020-09-18 NOTE — Telephone Encounter (Signed)
I'm returning your call.  Dr. Sherryle Lis said it's probably not an infection.  He said it's possibly Charcot.  He wants you to come in for an appointment to be evaluated.  "I can't come today.  Can I come in on Wednesday of next week, I have to make arrangements for transportation."  Dr. Sherryle Lis will not be in the office but I can get you in with Dr. Amalia Hailey on Tuesday, 09/22/2020 at 1:45 pm.  "Okay, that will be fine."

## 2020-09-21 ENCOUNTER — Telehealth: Payer: Self-pay

## 2020-09-21 NOTE — Telephone Encounter (Signed)
Patient calling she is needing all meds that can be refilled [facility] ph# [phone]

## 2020-09-21 NOTE — Chronic Care Management (AMB) (Signed)
  Care Management   Note  09/21/2020 Name: [name] MRN: [id number] DOB: 03/23/1966  [name] is a 55 y.o. year old female who is a primary care patient of Lindell Spar, MD and is actively engaged with the care management team. I reached out to [name] by phone today to assist with re-scheduling a follow up visit with the RN Case Manager.  Follow up plan: Telephone appointment with care management team member scheduled for:09/24/2020  [place] Management

## 2020-09-22 ENCOUNTER — Ambulatory Visit: Payer: Medicare Other | Admitting: Podiatry

## 2020-09-22 NOTE — Telephone Encounter (Signed)
Needs to know exactly which meds she needs refills on can you call and ask her?

## 2020-09-23 ENCOUNTER — Telehealth: Payer: Self-pay

## 2020-09-23 ENCOUNTER — Ambulatory Visit: Payer: Medicare Other | Admitting: Internal Medicine

## 2020-09-23 NOTE — Telephone Encounter (Signed)
We will be happy to do a virtual visit with her for this appt but next appt would need to be in office can set up for tomorrow

## 2020-09-23 NOTE — Telephone Encounter (Signed)
PT did not have a ride, she thinks she is going to die if she doesn't get her medication --Can you please call her   I advised that it was important to keep all appointments. She states she will call me back with when she can come in

## 2020-09-24 ENCOUNTER — Encounter: Payer: Self-pay | Admitting: Internal Medicine

## 2020-09-24 ENCOUNTER — Ambulatory Visit: Payer: Medicare Other | Admitting: *Deleted

## 2020-09-24 ENCOUNTER — Telehealth (INDEPENDENT_AMBULATORY_CARE_PROVIDER_SITE_OTHER): Payer: Medicare Other | Admitting: Internal Medicine

## 2020-09-24 ENCOUNTER — Other Ambulatory Visit: Payer: Self-pay

## 2020-09-24 DIAGNOSIS — E1142 Type 2 diabetes mellitus with diabetic polyneuropathy: Secondary | ICD-10-CM | POA: Diagnosis not present

## 2020-09-24 DIAGNOSIS — F251 Schizoaffective disorder, depressive type: Secondary | ICD-10-CM

## 2020-09-24 DIAGNOSIS — E1165 Type 2 diabetes mellitus with hyperglycemia: Secondary | ICD-10-CM

## 2020-09-24 DIAGNOSIS — E1169 Type 2 diabetes mellitus with other specified complication: Secondary | ICD-10-CM | POA: Diagnosis not present

## 2020-09-24 DIAGNOSIS — E785 Hyperlipidemia, unspecified: Secondary | ICD-10-CM | POA: Diagnosis not present

## 2020-09-24 DIAGNOSIS — I10 Essential (primary) hypertension: Secondary | ICD-10-CM

## 2020-09-24 DIAGNOSIS — E1149 Type 2 diabetes mellitus with other diabetic neurological complication: Secondary | ICD-10-CM | POA: Diagnosis not present

## 2020-09-24 DIAGNOSIS — IMO0002 Reserved for concepts with insufficient information to code with codable children: Secondary | ICD-10-CM

## 2020-09-24 IMAGING — DX DG FOOT COMPLETE 3+V*L*
3 series · 3 of 3 positions shown · non-contrast
Comparison: 05/31/2019

CLINICAL DATA: Left foot swelling for the past week. The patient
reports a palpable mass in the medial aspect of the foot for the
past week. Slight skin redness in that area.

EXAM:
LEFT FOOT - COMPLETE 3+ VIEW

[foot ap]
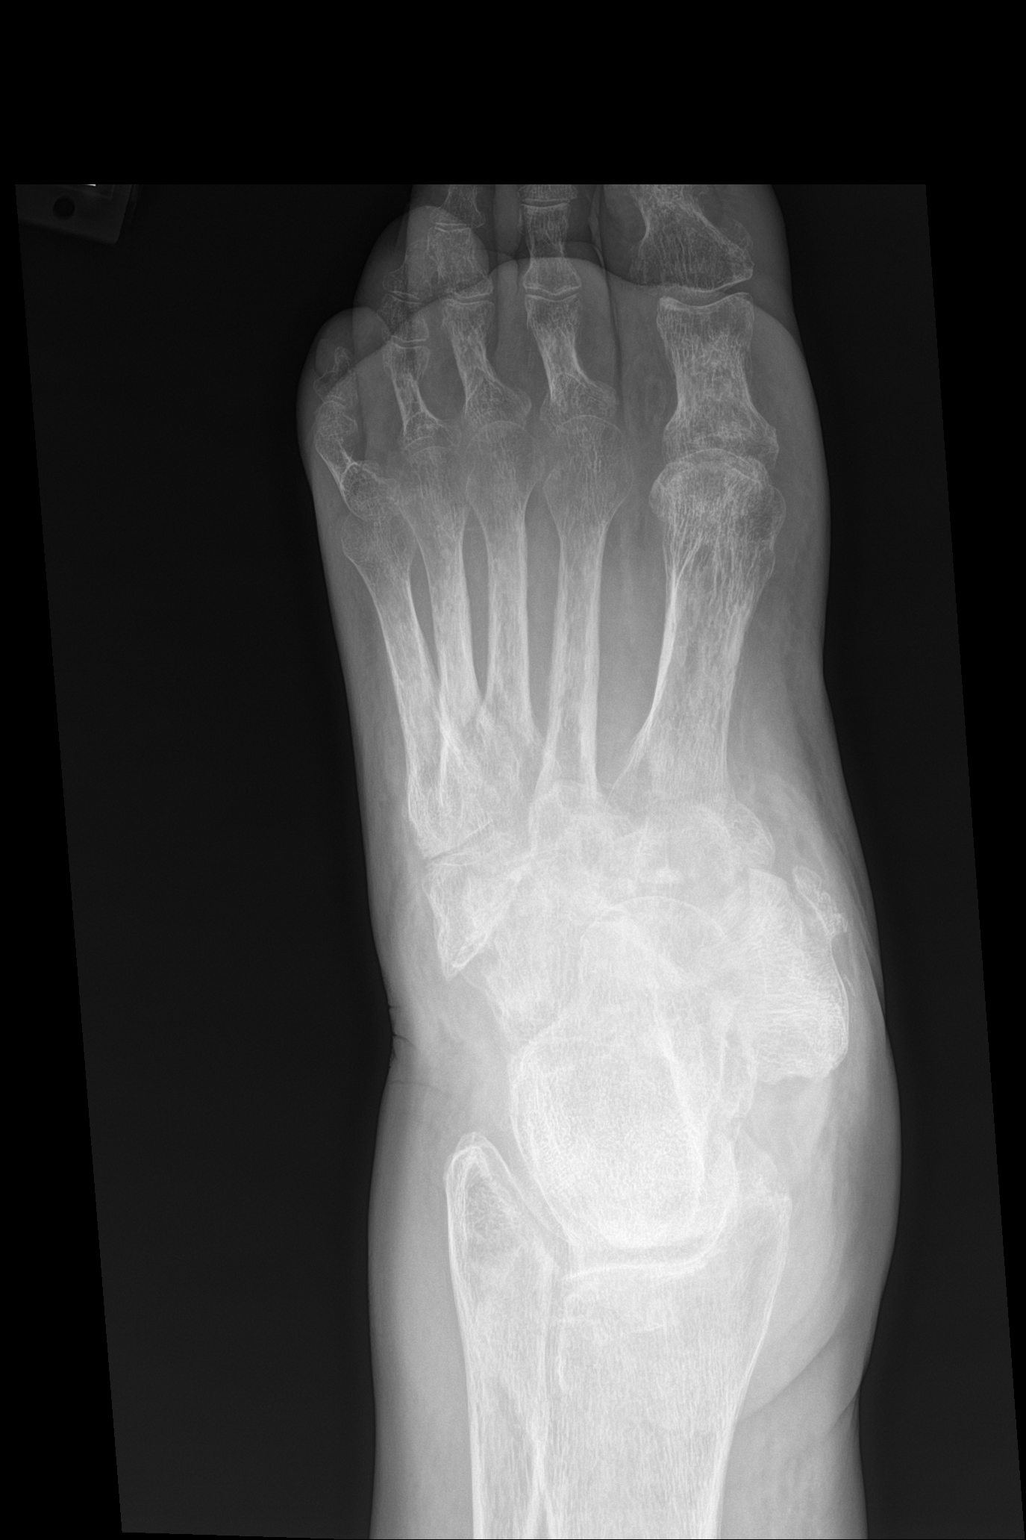

[foot obl]
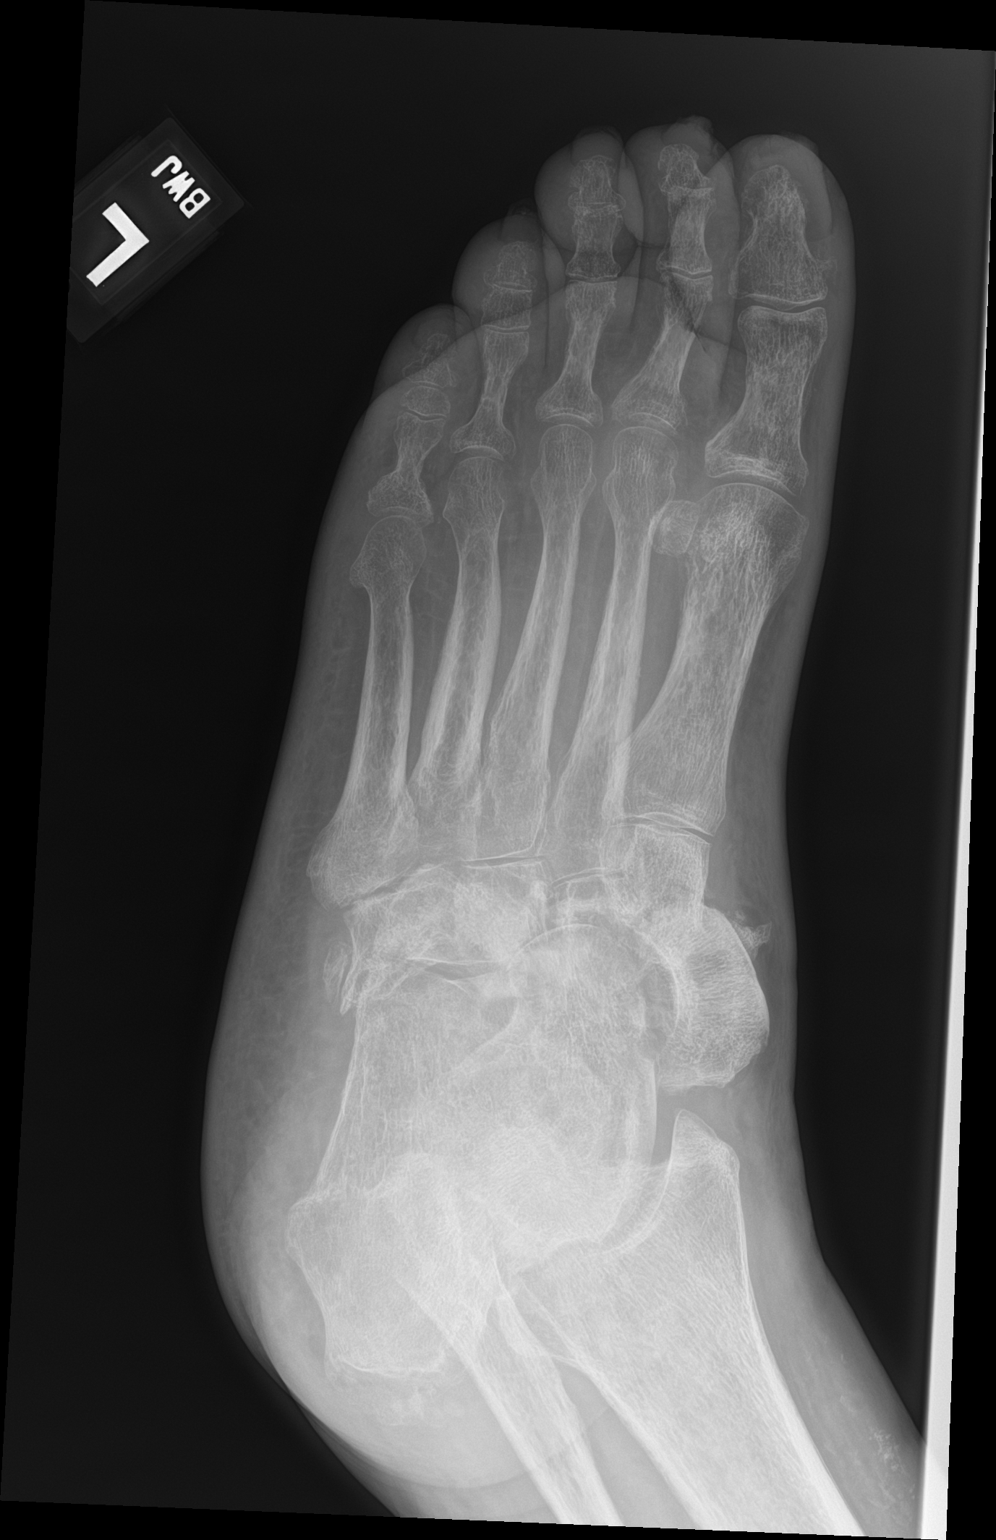

[foot lat]
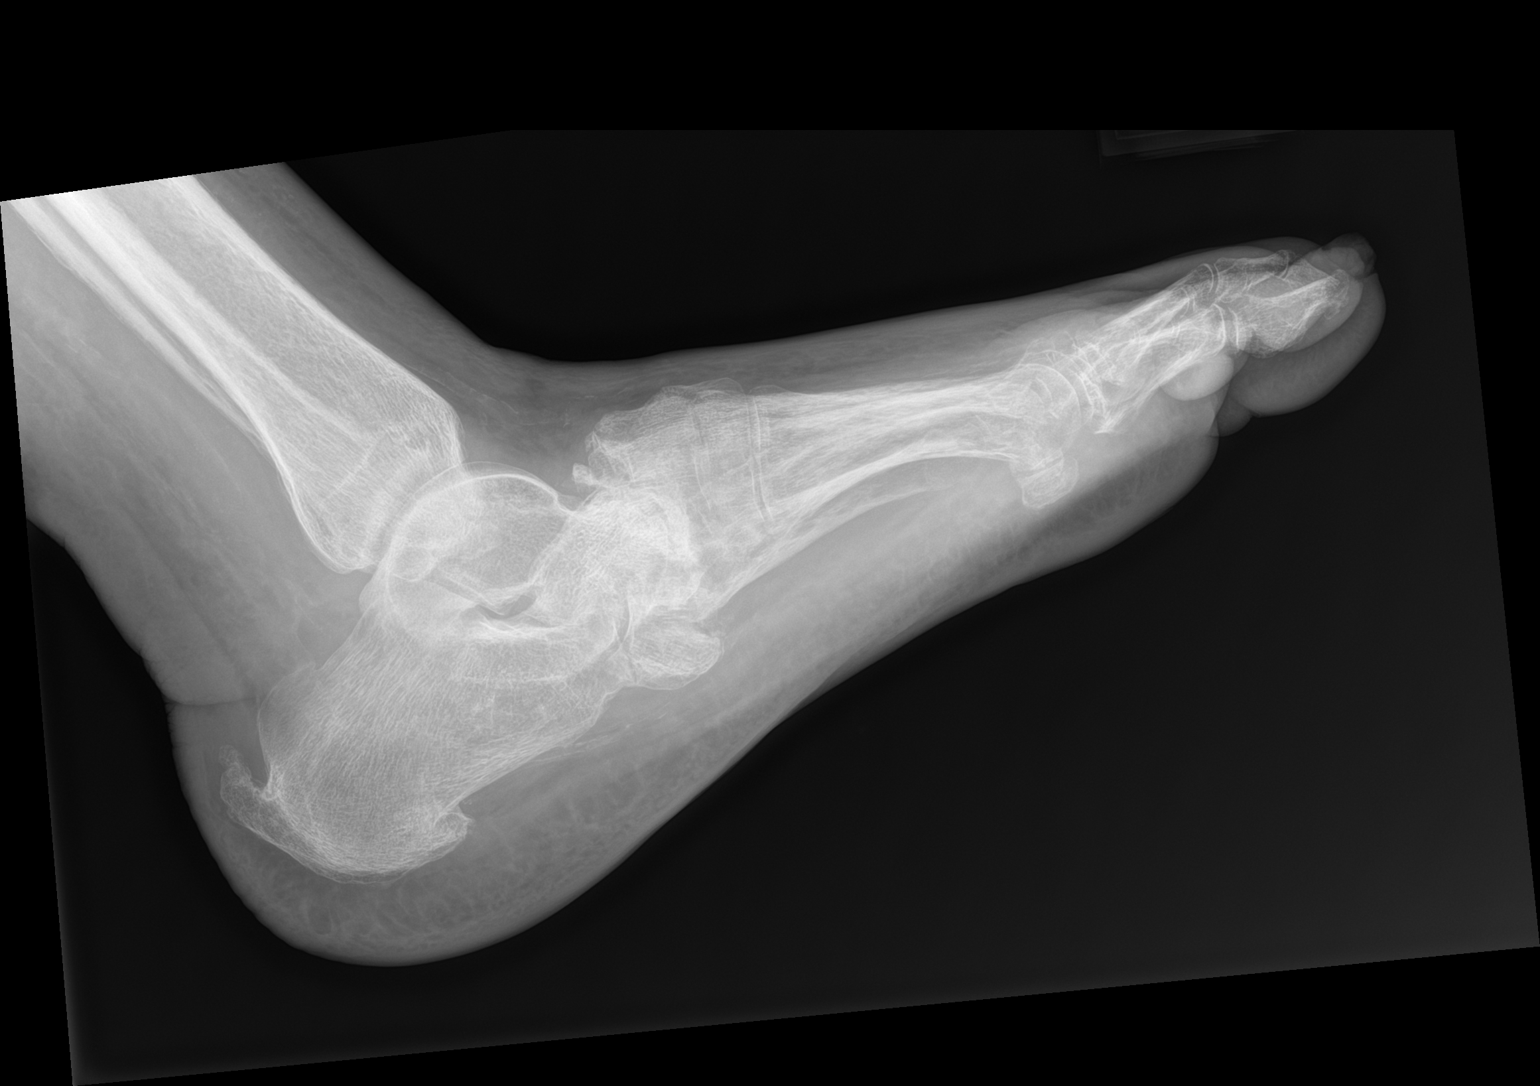

[3 of 3 positions shown; findings below may reference images not displayed]

FINDINGS: Mild dorsal soft tissue swelling with significant improvement.
Previously noted Charcot changes of the midfoot with malalignment
and bony fragmentation, including marked medial dislocation of the
navicular. No acute fracture or dislocation seen. Flattening of the
normal plantar arch. Large posterior and moderate-sized inferior
calcaneal enthesophytes. Atheromatous arterial calcifications.
IMPRESSION: 1. Mild dorsal soft tissue swelling with significant improvement.
2. Previously noted Charcot changes of the midfoot with malalignment
and bony fragmentation. The malalignment includes marked medial
dislocation of the navicular, possibly corresponding to the mass
felt by the patient.
3. Pes planus.

## 2020-09-24 MED ORDER — BLOOD GLUCOSE METER KIT: PACK | 0 refills | Status: DC

## 2020-09-24 MED ORDER — ATORVASTATIN CALCIUM 40 MG PO TABS: 40.0000 mg | ORAL_TABLET | Freq: Every day | ORAL | 1 refills | Status: DC

## 2020-09-24 MED ORDER — GABAPENTIN 300 MG PO CAPS: 300.0000 mg | ORAL_CAPSULE | Freq: Every day | ORAL | 1 refills | Status: DC

## 2020-09-24 MED ORDER — FUROSEMIDE 40 MG PO TABS
40.0000 mg | ORAL_TABLET | Freq: Every day | ORAL | 2 refills | Status: DC
Start: 2020-09-24 — End: 2020-12-16

## 2020-09-24 MED ORDER — METFORMIN HCL 1000 MG PO TABS: 1000.0000 mg | ORAL_TABLET | Freq: Every day | ORAL | 1 refills | Status: DC

## 2020-09-24 MED ORDER — METOPROLOL SUCCINATE ER 50 MG PO TB24: 50.0000 mg | ORAL_TABLET | Freq: Every day | ORAL | 1 refills | Status: DC

## 2020-09-24 MED ORDER — PANTOPRAZOLE SODIUM 40 MG PO TBEC: 40.0000 mg | DELAYED_RELEASE_TABLET | Freq: Every day | ORAL | 1 refills | Status: DC

## 2020-09-24 MED ORDER — LANTUS SOLOSTAR 100 UNIT/ML ~~LOC~~ SOPN: 25.0000 [IU] | PEN_INJECTOR | Freq: Every day | SUBCUTANEOUS | 0 refills | Status: DC

## 2020-09-24 MED ORDER — HYDROCHLOROTHIAZIDE 12.5 MG PO TABS: 12.5000 mg | ORAL_TABLET | Freq: Every day | ORAL | 1 refills | Status: DC

## 2020-09-24 NOTE — Telephone Encounter (Signed)
Pt made an appt 09-24-20

## 2020-09-24 NOTE — Progress Notes (Signed)
Virtual Visit via Telephone Note   This visit type was conducted due to national recommendations for restrictions regarding the COVID-19 Pandemic (e.g. social distancing) in an effort to limit this patient's exposure and mitigate transmission in our community.  Due to her co-morbid illnesses, this patient is at least at moderate risk for complications without adequate follow up.  This format is felt to be most appropriate for this patient at this time.  The patient did not have access to video technology/had technical difficulties with video requiring transitioning to audio format only (telephone).  All issues noted in this document were discussed and addressed.  No physical exam could be performed with this format.   Evaluation Performed:  Follow-up visit  Date:  09/24/2020   ID:  [name], DOB 12-26-1965, MRN [id number]  Patient Location: Home Provider Location: Office/Clinic  Participants: Patient Location of Patient: Home Location of Provider: Telehealth Consent was obtain for visit to be over via telehealth. I verified that I am speaking with the correct person using two identifiers.  PCP:  Lindell Spar, MD   Chief Complaint:  Follow up of chronic medical conditions  History of Present Illness:    [name] is a 55 y.o. female who has a televisit for follow up of her chronic medical conditions.  HTN: She has been taking medications regularly. Denies any headache, dizziness, chest pain, dyspnea or palpitations.  DM: She has been taking Metformin and using Lantus regularly. Her glucometer is not working properly and has not been checking her blood glucose.  She is unclear about her Psychiatric medications and it is not clear if she is following up with Dr Hoyle Barr regularly. She agrees to call their office for clarification of her medications.  She is concerned as she had unprotected sex recently. She is feeling lower abdominal discomfort and abdominal  distension. She would like to get urine test and pregnancy test. Denies any vaginal bleeding or discharge.  The patient does not have symptoms concerning for COVID-19 infection (fever, chills, cough, or new shortness of breath).   Past Medical, Surgical, Social History, Allergies, and Medications have been Reviewed.  Past Medical History:  Diagnosis Date  . Charcot's joint of foot, left   . Depression   . Diabetes mellitus without complication ([place])   . Hyperlipidemia   . Hypertension   . Neuromuscular disorder ([place])   . Schizo affective schizophrenia ([place])    abilify and pazil   Past Surgical History:  Procedure Laterality Date  . CHOLECYSTECTOMY    . COLONOSCOPY WITH PROPOFOL N/A 05/26/2019   Procedure: COLONOSCOPY WITH PROPOFOL;  Surgeon: Toledo, Benay Pike, MD;  Location: [facility] ENDOSCOPY;  Service: Gastroenterology;  Laterality: N/A;  . ESOPHAGOGASTRODUODENOSCOPY (EGD) WITH PROPOFOL N/A 05/26/2019   Procedure: ESOPHAGOGASTRODUODENOSCOPY (EGD) WITH PROPOFOL;  Surgeon: Toledo, Benay Pike, MD;  Location: [facility] ENDOSCOPY;  Service: Gastroenterology;  Laterality: N/A;  . IRRIGATION AND DEBRIDEMENT FOOT Right 02/26/2016   Procedure: RIGHT 2ND TOE DEBRIDEMENT;  Surgeon: Samara Deist, DPM;  Location: [facility] ORS;  Service: Podiatry;  Laterality: Right;  . IRRIGATION AND DEBRIDEMENT FOOT Left 12/13/2018   Procedure: IRRIGATION AND DEBRIDEMENT FOOT;  Surgeon: Caroline More, DPM;  Location: [facility] ORS;  Service: Podiatry;  Laterality: Left;     Current Meds  Medication Sig  . acetaminophen (TYLENOL) 325 MG tablet Take 2 tablets (650 mg total) by mouth every 6 (six) hours as needed for mild pain.  . ARIPiprazole (ABILIFY) 15 MG tablet Take 1  tablet (15 mg total) by mouth daily.  [name] buPROPion (WELLBUTRIN XL) 300 MG 24 hr tablet TAKE 1 TABLET BY MOUTH EACH MORNING. (Patient taking differently: Take 300 mg by mouth daily.)  . colestipol (COLESTID) 1 g tablet TAKE (2) TABLETS BY MOUTH TWICE DAILY.  (Patient taking differently: Take 1 g by mouth daily.)  . Lancets (ONETOUCH DELICA PLUS [id number]) MISC USE TO TEST 3 TIMES DAILY.  . mupirocin ointment (BACTROBAN) 2 % APPLY TO AFFECTED AREA TWICE DAILY  . ondansetron (ZOFRAN) 4 MG tablet Take 1 tablet (4 mg total) by mouth every 6 (six) hours. (Patient taking differently: Take 4 mg by mouth every 8 (eight) hours as needed for nausea or vomiting.)  . ONETOUCH VERIO test strip CHECK BLOOD SUGAR 3 TIMES DAILY.  [name] polyethylene glycol-electrolytes (TRILYTE) 420 g solution Take 4,000 mLs by mouth as directed.  . traZODone (DESYREL) 50 MG tablet TAKE 1 TABLET BY MOUTH AT BEDTIME AS NEEDED FOR SLEEP. (Patient taking differently: Take 50 mg by mouth at bedtime as needed for sleep.)  . [DISCONTINUED] atorvastatin (LIPITOR) 40 MG tablet Take 1 tablet (40 mg total) by mouth daily.  . [DISCONTINUED] furosemide (LASIX) 40 MG tablet Take 1 tablet (40 mg total) by mouth daily.  . [DISCONTINUED] gabapentin (NEURONTIN) 300 MG capsule Take 1 capsule (300 mg total) by mouth at bedtime.  . [DISCONTINUED] hydrochlorothiazide (HYDRODIURIL) 12.5 MG tablet Take 1 tablet (12.5 mg total) by mouth daily.  . [DISCONTINUED] insulin glargine (LANTUS SOLOSTAR) 100 UNIT/ML Solostar Pen Inject 25 Units into the skin at bedtime.  . [DISCONTINUED] metFORMIN (GLUCOPHAGE) 1000 MG tablet Take 1 tablet (1,000 mg total) by mouth daily with breakfast.  . [DISCONTINUED] metoprolol succinate (TOPROL-XL) 50 MG 24 hr tablet Take 1 tablet (50 mg total) by mouth daily. Take with or immediately following a meal.  . [DISCONTINUED] pantoprazole (PROTONIX) 40 MG tablet Take 1 tablet (40 mg total) by mouth 2 (two) times daily. (Patient taking differently: Take 40 mg by mouth daily.)     Allergies:   Patient has no known allergies.   ROS:   Please see the history of present illness.     All other systems reviewed and are negative.   Labs/Other Tests and Data Reviewed:    Recent  Labs: 03/03/2020: ALT 22 05/09/2020: Hemoglobin 9.2; Platelets 200 05/11/2020: BUN 21; Creatinine, Ser 1.09; Potassium 4.6; Sodium 137   Recent Lipid Panel Lab Results  Component Value Date/Time   CHOL 175 02/23/2020 06:54 AM   TRIG 275 (H) 02/23/2020 06:54 AM   HDL 42 02/23/2020 06:54 AM   CHOLHDL 4.2 02/23/2020 06:54 AM   LDLCALC 78 02/23/2020 06:54 AM   LDLCALC 135 (H) 11/07/2017 09:25 AM    Wt Readings from Last 3 Encounters:  08/03/20 247 lb (112 kg)  06/24/20 243 lb 1.9 oz (110.3 kg)  05/26/20 240 lb (108.9 kg)      ASSESSMENT & PLAN:    Essential hypertension BP Readings from Last 1 Encounters:  08/03/20 (!) 149/84   Well-controlled with HCTZ Counseled for compliance with the medications Advised DASH diet and moderate exercise/walking, at least 150 mins/week  DM (diabetes mellitus), type 2, 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HCC) JKD3O: 10.1 On Lantus 25 U QD, Humalog 5 U BID PRN (blood glucose > 250) and Metformin 1000 mg QD Advised to follow diabetic diet On statin F/u CMP and lipid panel Diabetic foot exam: Referred to Podiatry Diabetic eye exam: Advised to follow up with Ophthalmology for diabetic eye exam Will  follow up after 2 weeks to check HbA1C  Hyperlipidemia associated with type 2 diabetes mellitus ([place]) On Lipitor Check lipid profile  Schizoaffective disorder (HCC) On Abilify and Wellbutrin, although unclear whether she is taking them On Trazodone Followed by Psychiatry - needs to have follow up visit and clarification of medications    Time:   Today, I have spent 27 minutes reviewing the chart, including problem list, medications, and with the patient with telehealth technology discussing the above problems.   Medication Adjustments/Labs and Tests Ordered: Current medicines are reviewed at length with the patient today.  Concerns regarding medicines are outlined above.   Tests Ordered: No orders of the defined types were placed in  this encounter.   Medication Changes: Meds ordered this encounter  Medications  . atorvastatin (LIPITOR) 40 MG tablet    Sig: Take 1 tablet (40 mg total) by mouth daily.    Dispense:  90 tablet    Refill:  1    Bubble pack  . furosemide (LASIX) 40 MG tablet    Sig: Take 1 tablet (40 mg total) by mouth daily.    Dispense:  30 tablet    Refill:  2  . gabapentin (NEURONTIN) 300 MG capsule    Sig: Take 1 capsule (300 mg total) by mouth at bedtime.    Dispense:  90 capsule    Refill:  1    Bubble pack  . hydrochlorothiazide (HYDRODIURIL) 12.5 MG tablet    Sig: Take 1 tablet (12.5 mg total) by mouth daily.    Dispense:  90 tablet    Refill:  1    Bubble pack  . metFORMIN (GLUCOPHAGE) 1000 MG tablet    Sig: Take 1 tablet (1,000 mg total) by mouth daily with breakfast.    Dispense:  90 tablet    Refill:  1  . insulin glargine (LANTUS SOLOSTAR) 100 UNIT/ML Solostar Pen    Sig: Inject 25 Units into the skin at bedtime.    Dispense:  15 mL    Refill:  0  . metoprolol succinate (TOPROL-XL) 50 MG 24 hr tablet    Sig: Take 1 tablet (50 mg total) by mouth daily. Take with or immediately following a meal.    Dispense:  90 tablet    Refill:  1    Bubble pack  . pantoprazole (PROTONIX) 40 MG tablet    Sig: Take 1 tablet (40 mg total) by mouth daily.    Dispense:  90 tablet    Refill:  1    Bubble pack     Note: This dictation was prepared with Dragon dictation along with smaller phrase technology. Similar sounding words can be transcribed inadequately or may not be corrected upon review. Any transcriptional errors that result from this process are unintentional.      Disposition:  Follow up  Signed, Lindell Spar, MD  09/24/2020 9:55 AM     [place]

## 2020-09-24 NOTE — Assessment & Plan Note (Signed)
On Lipitor Check lipid profile 

## 2020-09-24 NOTE — Chronic Care Management (AMB) (Signed)
Chronic Care Management   CCM RN Visit Note  09/24/2020 Name: [name] MRN: [id number] DOB: 15-Feb-1966  Subjective: [name] is a 55 y.o. year old female who is a primary care patient of Lindell Spar, MD. The care management team was consulted for assistance with disease management and care coordination needs.    Engaged with patient by telephone for follow up visit in response to provider referral for case management and/or care coordination services.   Consent to Services:  The patient was given information about Chronic Care Management services, agreed to services, and gave verbal consent prior to initiation of services.  Please see initial visit note for detailed documentation.   Patient agreed to services and verbal consent obtained.   Assessment: Review of patient past medical history, allergies, medications, health status, including review of consultants reports, laboratory and other test data, was performed as part of comprehensive evaluation and provision of chronic care management services.   SDOH (Social Determinants of Health) assessments and interventions performed:    CCM Care Plan  No Known Allergies  Outpatient Encounter Medications as of 09/24/2020  Medication Sig Note  . acetaminophen (TYLENOL) 325 MG tablet Take 2 tablets (650 mg total) by mouth every 6 (six) hours as needed for mild pain.   . ARIPiprazole (ABILIFY) 15 MG tablet Take 1 tablet (15 mg total) by mouth daily.   [name] atorvastatin (LIPITOR) 40 MG tablet Take 1 tablet (40 mg total) by mouth daily.   . blood glucose meter kit and supplies Dispense based on patient and insurance preference. Use up to four times daily as directed. (FOR ICD-10 E10.9, E11.9).   [name] buPROPion (WELLBUTRIN XL) 300 MG 24 hr tablet TAKE 1 TABLET BY MOUTH EACH MORNING. (Patient taking differently: Take 300 mg by mouth daily.)   . colestipol (COLESTID) 1 g tablet TAKE (2) TABLETS BY MOUTH TWICE DAILY. (Patient  taking differently: Take 1 g by mouth daily.)   . furosemide (LASIX) 40 MG tablet Take 1 tablet (40 mg total) by mouth daily.   [name] gabapentin (NEURONTIN) 300 MG capsule Take 1 capsule (300 mg total) by mouth at bedtime.   . hydrochlorothiazide (HYDRODIURIL) 12.5 MG tablet Take 1 tablet (12.5 mg total) by mouth daily.   . insulin glargine (LANTUS SOLOSTAR) 100 UNIT/ML Solostar Pen Inject 25 Units into the skin at bedtime.   . Lancets (ONETOUCH DELICA PLUS [id number]) MISC USE TO TEST 3 TIMES DAILY.   . metFORMIN (GLUCOPHAGE) 1000 MG tablet Take 1 tablet (1,000 mg total) by mouth daily with breakfast.   . metoprolol succinate (TOPROL-XL) 50 MG 24 hr tablet Take 1 tablet (50 mg total) by mouth daily. Take with or immediately following a meal.   . mupirocin ointment (BACTROBAN) 2 % APPLY TO AFFECTED AREA TWICE DAILY   . ondansetron (ZOFRAN) 4 MG tablet Take 1 tablet (4 mg total) by mouth every 6 (six) hours. (Patient taking differently: Take 4 mg by mouth every 8 (eight) hours as needed for nausea or vomiting.)   . ONETOUCH VERIO test strip CHECK BLOOD SUGAR 3 TIMES DAILY.   . pantoprazole (PROTONIX) 40 MG tablet Take 1 tablet (40 mg total) by mouth daily.   . polyethylene glycol-electrolytes (TRILYTE) 420 g solution Take 4,000 mLs by mouth as directed.   . traZODone (DESYREL) 50 MG tablet TAKE 1 TABLET BY MOUTH AT BEDTIME AS NEEDED FOR SLEEP. (Patient taking differently: Take 50 mg by mouth at bedtime as needed for sleep.) 08/03/2020: As  needed per patient, has not needed lately falls asleep with out.  . [DISCONTINUED] atorvastatin (LIPITOR) 40 MG tablet Take 1 tablet (40 mg total) by mouth daily.   . [DISCONTINUED] furosemide (LASIX) 40 MG tablet Take 1 tablet (40 mg total) by mouth daily.   . [DISCONTINUED] gabapentin (NEURONTIN) 300 MG capsule Take 1 capsule (300 mg total) by mouth at bedtime.   . [DISCONTINUED] hydrochlorothiazide (HYDRODIURIL) 12.5 MG tablet Take 1 tablet (12.5 mg total) by mouth  daily.   . [DISCONTINUED] insulin glargine (LANTUS SOLOSTAR) 100 UNIT/ML Solostar Pen Inject 25 Units into the skin at bedtime.   . [DISCONTINUED] metFORMIN (GLUCOPHAGE) 1000 MG tablet Take 1 tablet (1,000 mg total) by mouth daily with breakfast.   . [DISCONTINUED] metoprolol succinate (TOPROL-XL) 50 MG 24 hr tablet Take 1 tablet (50 mg total) by mouth daily. Take with or immediately following a meal.   . [DISCONTINUED] pantoprazole (PROTONIX) 40 MG tablet Take 1 tablet (40 mg total) by mouth 2 (two) times daily. (Patient taking differently: Take 40 mg by mouth daily.)    No facility-administered encounter medications on file as of 09/24/2020.    Patient Active Problem List   Diagnosis Date Noted  . Lactose intolerance 08/03/2020  . Charcot's joint of foot, left   . Slurred speech 05/09/2020  . Schizophrenia ([place]) 02/20/2020  . Chronic diarrhea 11/25/2019  . History of colon cancer 11/25/2019  . Iron deficiency anemia 07/25/2019  . S/P partial colectomy 07/17/2019  . 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[place]) 05/28/2019  . Anemia   . Neuropathic foot ulcer, left, with fat layer exposed ([place]) 02/05/2019  . Morbid obesity ([place]) 05/10/2017  . Long-term use of high-risk medication 05/10/2017  . Hyperlipidemia associated with type 2 diabetes mellitus ([place]) 06/27/2016  . Hammer toe of second toe of right foot 04/20/2016  . Essential hypertension 06/29/2015  . Schizoaffective disorder ([place]) 06/29/2015  . DM (diabetes mellitus), type 2, 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[place]) 06/12/1550  . Polyneuropathy 04/02/2014    Conditions to be addressed/monitored:HTN and DMII  Care Plan : RNCM: Diabetes Type 2 (Adult)  Updates made by Kassie Mends, RN since 09/24/2020 12:00 AM    Problem: RNCM: Glycemic Management (Diabetes, Type 2)   Priority: High    Long-Range Goal: RNCM: Glycemic Management Optimized   Start Date: 07/02/2020  Expected End Date: 01/02/2021  This Visit's Progress: Not on  track  Recent Progress: Not on track  Priority: High  Note:   Objective:  Lab Results  Component Value Date   HGBA1C 10.1 (H) 05/08/2020 .   Lab Results  Component Value Date   CREATININE 1.09 (H) 05/11/2020   CREATININE 1.34 (H) 05/10/2020   CREATININE 1.95 (H) 05/09/2020 .   [name] No results found for: EGFR Current Barriers:  [name] Knowledge Deficits related to basic Diabetes pathophysiology and self care/management- patient reports her friend set up eye exam for near future so they can ride together, pt reports has not checked CBG recently due to glucometer "broke" and in process of getting new one per video visit with PCP today, pt in process of having medications changed back to [place] in [place] as she does not like mail order citing this does not work well for her, pt feels she may have UTI and will be sending urine specimen to PCP after speaking with doctor this morning, patient did not see podiatrist citing "transportation got messed up"  pt is going to reschedule, pt is to take driver's license and insurance  card to psychiatry/ counseling office and is to set up transportation for this.  . Knowledge Deficits related to medications used for management of diabetes . Patient says new glucometer is broken . 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 Cognitive Deficits . Limited Social Support- patient is not seeing psychiatrist,  reports daughter lives nearby and " she and other family members basically have nothing to do with me" Pt does not have a car, relies on others and [employer] public transporation. Pt continues to have unprotected sex with a man in her apartment complex, this has been going on for awhile, pt has spoken with PCP about this. . Unable to independently manage DM2- CBG - has not been checking, verbalizes knows she is supposed to check . Does not adhere to provider recommendations re: take medications, keep appointments, adhere to prescribed diet- pt reports  she is eating better with improved appetite (eating tuna, chicken, vegetables), pt has not been keeping transportation appointments. . Patient is trying to do better with attending all scheduled appointments . Does not adhere to prescribed medication regimen- pt has all medications, finished antibiotic and reports UTI resolved . Lacks social connections . Unable to perform IADLs independently . Does not always maintain contact with provider office . Does not contact provider office for questions/concerns Case Manager Clinical Goal(s):  [name] Collaboration with Dr.Kutwik Posey Pronto regarding development and update of comprehensive plan of care as evidenced by provider attestation and co-signature . Inter-disciplinary care team collaboration (see longitudinal plan of care) . Over the next 120 days, patient will demonstrate improved adherence to prescribed treatment plan for diabetes self care/management as evidenced by:  . daily monitoring and recording of CBG  . adherence to ADA/ carb modified diet . adherence to prescribed medication regimen Interventions:  . Provided education to patient about basic DM disease process . Reviewed medications with patient and discussed importance of medication adherence . Discussed plans with patient for ongoing care management follow up and provided patient with direct contact information for care management team- [phone] . Reviewed with patient signs/ symptoms hypo and hyperglycemia and importance of correct treatment . Reviewed scheduled/upcoming provider appointments including:  rescheduling podiatry appointment with Lanae Crumbly and follow up with primary care provider (pt had video visit today)  . Reinforced with patient importance of checking cbg TID and record . Diabetes education (checking your feet) sent via My Chart  . Assessed that patient does not have glucometer citing her's broke and in process of getting new one per her discussion with primary care  provider today. . Review of patient status, including review of consultants reports, relevant laboratory and other test results, and medications completed. . Reviewed possible exposure to STD's with having unprotected sex and encouraged to use protection . Sent in basket message to primary care provider with update that pt does need glucometer and all medications transferred to [place] Patient Goals/Self-Care Activities . Over the next 120 days, patient will:  Self administers oral medications as prescribed Attends all scheduled provider appointments Checks blood sugars as prescribed and utilize hyper and hypoglycemia protocol as needed . Take urine specimen to primary care provider . Attend scheduled eye exam . Reschedule visit with podiatrist Lanae Crumbly (foot dr) and call for transportation . Set up transportation and take driver's license and insurance card to set up counseling sessions . Follow up and make sure to get glucometer this week . [facility] with [facility] and get remainder of medications . check blood sugar at prescribed  times . check blood sugar if I feel it is too high or too low . take the blood sugar log and glucometer to all doctor visits . Follow low carb diet(be careful with bread, pasta, rice, sweets) . Try to exercise some each day- walking is good Follow Up Plan: Telephone follow up appointment with care management team member scheduled for:  10/29/2020   Care Plan : RNCM: Hypertension (Adult)  Updates made by Kassie Mends, RN since 09/24/2020 12:00 AM    Problem: RNCM: Hypertension (Hypertension)   Priority: Medium    Goal: RNCM: Hypertension Monitored   Start Date: 07/02/2020  Expected End Date: 01/02/2021  This Visit's Progress: Not on track  Recent Progress: Not on track  Priority: Medium  Note:   Objective:  . Last practice recorded BP readings:  BP Readings from Last 3 Encounters:  05/11/20 (!) 156/74  04/17/20 134/76  03/04/20  (!) 170/80 .   [name] Most recent eGFR/CrCl: No results found for: EGFR  No components found for: CRCL Current Barriers:  [name] Knowledge Deficits related to basic understanding of hypertension pathophysiology and self care management . Knowledge Deficits related to understanding of medications prescribed for management of hypertension . Non-adherence to scheduled provider appointments- client has missed several scheduled appointments such as podiatrist citing "transportation mix up" . Transportation barriers- pt uses [employer] and relatives assist "if they aren't mad at me" . Cognitive Deficits- seems to have difficulty completing tasks and concentrating . Limited Social Support- social worker involved . Film/video editor . Unable to independently management of HTN- pt previously stated she has new BP cuff and now states she does not and will be getting from insurance company at beginning of the month.  States " will use my friend's cuff until I get one"  . Does not adhere to provider recommendations re: dietary restrictions, medications management and lifestyle changes  . Does not always attend all scheduled provider appointments . Does not always adhere to prescribed medication regimen- reports does not like mail order pharmacy and in process of switching back to [facility] in [place]. Le[place]is [place] social connections- has reported dysfunction in her family, relationships are not good, Education officer, museum is involved. . Does not always maintain contact with provider office- there are several appointments and transportation to be rescheduled, pt has not completed, states she is able and does not give reason why she has not done so. . Does not contact provider office for questions/concerns Case Manager Clinical Goal(s):  [name] Over the next 120 days, patient will verbalize understanding of plan for hypertension management . Over the next 120 days, patient will attend all scheduled medical appointments: pt to  follow up Dr. Posey Pronto, had video visit today, to reschedule podiatrist . Over the next 120 days, patient will demonstrate improved adherence to prescribed treatment plan for hypertension as evidenced by taking all medications as prescribed, monitoring and recording blood pressure as directed, adhering to low sodium/DASH diet . Over the next 120 days, patient will demonstrate improved health management independence as evidenced by checking blood pressure as directed and notifying PCP if SBP>160 or DBP > 90, taking all medications as prescribed, and adhering to a low sodium diet as discussed. . Over the next 120 days, patient will verbalize basic understanding of hypertension disease process and self health management plan as evidenced by compliance with medications, diet, and working with CCM team to maintain health and well being.  Interventions:  . Collaboration with Dr. Ihor Dow regarding development  and update of comprehensive plan of care as evidenced by provider attestation and co-signature . Inter-disciplinary care team collaboration (see longitudinal plan of care) . Evaluation of current treatment plan related to hypertension self management and patient's adherence to plan as established by provider. . Provided education to patient re: assessed pt does not have new blood pressure cuff and importance of using friend's BP cuff until she can get her's from [employer], importance of checking BP daily and recording, DASH diet, complications of uncontrolled blood pressure and calling PCP for readings outside normal limits . Reviewed choosing low sodium foods, avoid salty snacks . Choose fresh and frozen vegetables if possible, lean meats . Reviewed medications with patient and discussed importance of compliance . Discussed plans with patient for ongoing care management follow up . Sent in basket to Dr. Posey Pronto with update pt states she needs new glucometer and having medications switched to  another pharmacy. Patient Goals/Self-Care Activities . Over the next 120 days, patient will:  - Self administers medications as prescribed . check blood pressure daily and record . Continue to attend all scheduled provider appointments Dr. Posey Pronto and reschedule podiatrist visit . Call provider office for new concerns, questions, or BP outside discussed parameters . Please use your friend's blood pressure cuff to check BP daily and get new cuff from [facility] (per catalog) . Choose low sodium foods, avoid salty snacks such as chips. Follow Up Plan: Telephone follow up appointment with care management team member scheduled for:  10/29/2020     Plan:Telephone follow up appointment with care management team member scheduled for:  10/29/2020  Jacqlyn Larsen [facility], BSN RN Case Manager [facility] [phone]

## 2020-09-24 NOTE — Assessment & Plan Note (Signed)
On Abilify and Wellbutrin, although unclear whether she is taking them ?On Trazodone ?Followed by Psychiatry - needs to have follow up visit and clarification of medications ?

## 2020-09-24 NOTE — Assessment & Plan Note (Signed)
BP Readings from Last 1 Encounters:  08/03/20 (!) 149/84   Well-controlled with HCTZ Counseled for compliance with the medications Advised DASH diet and moderate exercise/walking, at least 150 mins/week

## 2020-09-24 NOTE — Patient Instructions (Signed)
Visit Information  PATIENT GOALS: Goals Addressed            This Visit's Progress   . Monitor and Manage My Blood Sugar-Diabetes Type 2       Timeframe:  Long-Range Goal Priority:  High Start Date:           07/02/2020                  Expected End Date:   01/02/2021                    Follow Up Date- 10/29/2020  . Take urine specimen to primary care provider . Attend scheduled eye exam . Reschedule visit with podiatrist Lanae Crumbly (foot dr) and call for transportation . Set up transportation and take driver's license and insurance card to set up counseling sessions . Follow up and make sure to get glucometer this week . Check with [facility] and get remainder of medications . check blood sugar at prescribed times . check blood sugar if I feel it is too high or too low . take the blood sugar log and glucometer to all doctor visits . Follow low carb diet(be careful with bread, pasta, rice, sweets) . Try to exercise some each day- walking is good   Why is this important?    Checking your blood sugar at home helps to keep it from getting very high or very low.   Writing the results in a diary or log helps the doctor know how to care for you.   Your blood sugar log should have the time, date and the results.   Also, write down the amount of insulin or other medicine that you take.   Other information, like what you ate, exercise done and how you were feeling, will also be helpful.     Notes:     . Track and Manage My Blood Pressure-Hypertension       Timeframe:  Long-Range Goal Priority:  High Start Date:        07/02/2020                     Expected End Date:     01/02/2021                  Follow Up Date 10/29/2020  . check blood pressure daily and record . Continue to attend all scheduled provider appointments Dr. Posey Pronto and reschedule podiatrist visit . Call provider office for new concerns, questions, or BP outside discussed parameters . Please use your  friend's blood pressure cuff to check BP daily and get new cuff from [facility] (per catalog) . Choose low sodium foods, avoid salty snacks such as chips.    Why is this important?    You won't feel high blood pressure, but it can still hurt your blood vessels.   High blood pressure can cause heart or kidney problems. It can also cause a stroke.   Making lifestyle changes like losing a little weight or eating less salt will help.   Checking your blood pressure at home and at different times of the day can help to control blood pressure.   If the doctor prescribes medicine remember to take it the way the doctor ordered.   Call the office if you cannot afford the medicine or if there are questions about it.     Notes:        Patient verbalizes understanding of  instructions provided today and agrees to view in [place].   Telephone follow up appointment with care management team member scheduled for:  10/29/2020  Jacqlyn Larsen [facility], BSN RN Case Manager [facility] [phone]   Diabetes Mellitus and [place] care is an important part of your health, especially when you have diabetes. Diabetes may cause you to have problems because of poor blood flow (circulation) to your feet and legs, which can cause your skin to:  Become thinner and drier.  Break more easily.  Heal more slowly.  Peel and crack. You may also have nerve damage (neuropathy) in your legs and feet, causing decreased feeling in them. This means that you may not notice minor injuries to your feet that could lead to more serious problems. Noticing and addressing any potential problems early is the best way to prevent future foot problems. How to care for your feet Foot hygiene  Wash your feet daily with warm water and mild soap. Do not use hot water. Then, pat your feet and the areas between your toes until they are completely dry. Do not soak your feet as this can dry your skin.  Trim  your toenails straight across. Do not dig under them or around the cuticle. File the edges of your nails with an emery board or nail file.  Apply a moisturizing lotion or petroleum jelly to the skin on your feet and to dry, brittle toenails. Use lotion that does not contain alcohol and is unscented. Do not apply lotion between your toes.   Shoes and socks  Wear clean socks or stockings every day. Make sure they are not too tight. Do not wear knee-high stockings since they may decrease blood flow to your legs.  Wear shoes that fit properly and have enough cushioning. Always look in your shoes before you put them on to be sure there are no objects inside.  To break in new shoes, wear them for just a few hours a day. This prevents injuries on your feet. Wounds, scrapes, corns, and calluses  Check your feet daily for blisters, cuts, bruises, sores, and redness. If you cannot see the bottom of your feet, use a mirror or ask someone for help.  Do not cut corns or calluses or try to remove them with medicine.  If you find a minor scrape, cut, or break in the skin on your feet, keep it and the skin around it clean and dry. You may clean these areas with mild soap and water. Do not clean the area with peroxide, alcohol, or iodine.  If you have a wound, scrape, corn, or callus on your foot, look at it several times a day to make sure it is healing and not infected. Check for: ? Redness, swelling, or pain. ? Fluid or blood. ? Warmth. ? Pus or a bad smell.   General tips  Do not cross your legs. This may decrease blood flow to your feet.  Do not use heating pads or hot water bottles on your feet. They may burn your skin. If you have lost feeling in your feet or legs, you may not know this is happening until it is too late.  Protect your feet from hot and cold by wearing shoes, such as at the beach or on hot pavement.  Schedule a complete foot exam at least once a year (annually) or more often if  you have foot problems. Report any cuts, sores, or bruises to your health care provider  immediately. Where to find more information  American Diabetes Association: www.diabetes.org  Association of Diabetes Care & Education Specialists: www.diabeteseducator.org Contact a health care provider if:  You have a medical condition that increases your risk of infection and you have any cuts, sores, or bruises on your feet.  You have an injury that is not healing.  You have redness on your legs or feet.  You feel burning or tingling in your legs or feet.  You have pain or cramps in your legs and feet.  Your legs or feet are numb.  Your feet always feel cold.  You have pain around any toenails. Get help right away if:  You have a wound, scrape, corn, or callus on your foot and: ? You have pain, swelling, or redness that gets worse. ? You have fluid or blood coming from the wound, scrape, corn, or callus. ? Your wound, scrape, corn, or callus feels warm to the touch. ? You have pus or a bad smell coming from the wound, scrape, corn, or callus. ? You have a fever. ? You have a red line going up your leg. Summary  Check your feet every day for blisters, cuts, bruises, sores, and redness.  Apply a moisturizing lotion or petroleum jelly to the skin on your feet and to dry, brittle toenails.  Wear shoes that fit properly and have enough cushioning.  If you have foot problems, report any cuts, sores, or bruises to your health care provider immediately.  Schedule a complete foot exam at least once a year (annually) or more often if you have foot problems. This information is not intended to replace advice given to you by your health care provider. Make sure you discuss any questions you have with your health care provider. Document Revised: 11/07/2019 Document Reviewed: 11/07/2019 Elsevier Patient Education  [place].

## 2020-09-24 NOTE — Patient Instructions (Signed)
Please continue to take medications as prescribed.  Please follow low carb and low salt diet.  Please contact your Psychiatrist for refill of Psychiatric medications and follow up with them regularly.

## 2020-09-24 NOTE — Assessment & Plan Note (Addendum)
HbA1C: 10.1 On Lantus 25 U QD, Humalog 5 U BID PRN (blood glucose > 250) and Metformin 1000 mg QD Advised to follow diabetic diet On statin F/u CMP and lipid panel Diabetic foot exam: Referred to Podiatry Diabetic eye exam: Advised to follow up with Ophthalmology for diabetic eye exam Will follow up after 2 weeks to check HbA1C

## 2020-09-29 ENCOUNTER — Other Ambulatory Visit: Payer: Self-pay | Admitting: *Deleted

## 2020-09-29 DIAGNOSIS — F251 Schizoaffective disorder, depressive type: Secondary | ICD-10-CM

## 2020-09-29 MED ORDER — ARIPIPRAZOLE 15 MG PO TABS: 15.0000 mg | ORAL_TABLET | Freq: Every day | ORAL | 0 refills | Status: DC

## 2020-09-29 MED ORDER — COLESTIPOL HCL 1 G PO TABS: ORAL_TABLET | ORAL | 0 refills | Status: DC

## 2020-09-29 MED ORDER — BUPROPION HCL ER (XL) 300 MG PO TB24: ORAL_TABLET | ORAL | 0 refills | Status: DC

## 2020-10-06 ENCOUNTER — Other Ambulatory Visit: Payer: Self-pay | Admitting: Nurse Practitioner

## 2020-10-06 ENCOUNTER — Telehealth: Payer: Self-pay

## 2020-10-06 ENCOUNTER — Other Ambulatory Visit: Payer: Self-pay | Admitting: *Deleted

## 2020-10-06 DIAGNOSIS — IMO0002 Reserved for concepts with insufficient information to code with codable children: Secondary | ICD-10-CM

## 2020-10-06 DIAGNOSIS — F251 Schizoaffective disorder, depressive type: Secondary | ICD-10-CM

## 2020-10-06 DIAGNOSIS — E1149 Type 2 diabetes mellitus with other diabetic neurological complication: Secondary | ICD-10-CM

## 2020-10-06 MED ORDER — BUPROPION HCL ER (XL) 300 MG PO TB24: ORAL_TABLET | ORAL | 0 refills | Status: DC

## 2020-10-06 MED ORDER — LANTUS SOLOSTAR 100 UNIT/ML ~~LOC~~ SOPN: 25.0000 [IU] | PEN_INJECTOR | Freq: Every day | SUBCUTANEOUS | 0 refills | Status: DC

## 2020-10-06 MED ORDER — METFORMIN HCL 1000 MG PO TABS: 1000.0000 mg | ORAL_TABLET | Freq: Every day | ORAL | 1 refills | Status: DC

## 2020-10-06 MED ORDER — TRAZODONE HCL 50 MG PO TABS: 50.0000 mg | ORAL_TABLET | Freq: Every evening | ORAL | 3 refills | Status: DC | PRN

## 2020-10-06 MED ORDER — ARIPIPRAZOLE 15 MG PO TABS: 15.0000 mg | ORAL_TABLET | Freq: Every day | ORAL | 0 refills | Status: DC

## 2020-10-06 NOTE — Telephone Encounter (Signed)
[name] from [place] called trying to get some prescription refills sent out in the shipment. Have not received any of these medicines to ship out.  ARIPiprazole (ABILIFY) 15 MG tablet   insulin glargine (LANTUS SOLOSTAR) 100 UNIT/ML Solostar Pen   metFORMIN (GLUCOPHAGE) 1000 MG tablet   buPROPion (WELLBUTRIN XL) 300 MG 24 hr tablet   traZODone (DESYREL) 50 MG tablet  Please call in to [phone] or fax in to [phone]  Any issues please contact [phone]

## 2020-10-06 NOTE — Telephone Encounter (Signed)
I sent it, but I don't think trazodone is controlled.

## 2020-10-06 NOTE — Telephone Encounter (Signed)
[facility] except for trazodone as this is controlled will you send for [facility]

## 2020-10-08 ENCOUNTER — Ambulatory Visit: Payer: Medicare Other | Admitting: Internal Medicine

## 2020-10-19 ENCOUNTER — Other Ambulatory Visit: Payer: Self-pay | Admitting: Internal Medicine

## 2020-10-19 DIAGNOSIS — F251 Schizoaffective disorder, depressive type: Secondary | ICD-10-CM

## 2020-10-21 ENCOUNTER — Other Ambulatory Visit: Payer: Self-pay

## 2020-10-21 ENCOUNTER — Encounter: Payer: Self-pay | Admitting: Internal Medicine

## 2020-10-21 ENCOUNTER — Ambulatory Visit (INDEPENDENT_AMBULATORY_CARE_PROVIDER_SITE_OTHER): Payer: Medicare Other | Admitting: Internal Medicine

## 2020-10-21 VITALS — BP 142/82 | HR 96 | Resp 18 | Ht 70.0 in | Wt 249.1 lb

## 2020-10-21 DIAGNOSIS — E1165 Type 2 diabetes mellitus with hyperglycemia: Secondary | ICD-10-CM

## 2020-10-21 DIAGNOSIS — IMO0002 Reserved for concepts with insufficient information to code with codable children: Secondary | ICD-10-CM

## 2020-10-21 DIAGNOSIS — E1149 Type 2 diabetes mellitus with other diabetic neurological complication: Secondary | ICD-10-CM

## 2020-10-21 DIAGNOSIS — I10 Essential (primary) hypertension: Secondary | ICD-10-CM

## 2020-10-21 DIAGNOSIS — T23161A Burn of first degree of back of right hand, initial encounter: Secondary | ICD-10-CM | POA: Diagnosis not present

## 2020-10-21 DIAGNOSIS — F251 Schizoaffective disorder, depressive type: Secondary | ICD-10-CM | POA: Diagnosis not present

## 2020-10-21 DIAGNOSIS — Z23 Encounter for immunization: Secondary | ICD-10-CM | POA: Diagnosis not present

## 2020-10-21 DIAGNOSIS — K219 Gastro-esophageal reflux disease without esophagitis: Secondary | ICD-10-CM | POA: Insufficient documentation

## 2020-10-21 LAB — POCT GLYCOSYLATED HEMOGLOBIN (HGB A1C): HbA1c, POC (controlled diabetic range): 9.4 % — AB (ref 0.0–7.0)

## 2020-10-21 MED ORDER — LANTUS SOLOSTAR 100 UNIT/ML ~~LOC~~ SOPN
30.0000 [IU] | PEN_INJECTOR | Freq: Every day | SUBCUTANEOUS | 0 refills | Status: DC
Start: 2020-10-21 — End: 2020-11-19

## 2020-10-21 MED ORDER — LOSARTAN POTASSIUM-HCTZ 50-12.5 MG PO TABS: 1.0000 | ORAL_TABLET | Freq: Every day | ORAL | 0 refills | Status: DC

## 2020-10-21 NOTE — Assessment & Plan Note (Addendum)
HbA1C: 9.4, better than prior On Lantus 22 U QD, Humalog 5 U BID PRN (blood glucose > 250) and Metformin 1000 mg QD Increased Lantus to 30 U qHS Advised to follow diabetic diet On statin F/u CMP and lipid panel Diabetic foot exam: Referred to Podiatry Diabetic eye exam: Advised to follow up with Ophthalmology for diabetic eye exam

## 2020-10-21 NOTE — Addendum Note (Signed)
Addended by: Zacarias Pontes R on: 10/21/2020 03:11 PM   Modules accepted: Orders

## 2020-10-21 NOTE — Progress Notes (Addendum)
Established Patient Office Visit  Subjective:  Patient ID: [name], female    DOB: 1965/12/08  Age: 55 y.o. MRN: [id number]  CC:  Chief Complaint  Patient presents with   Follow-up    2 week follow up pt has burn on her right hand from skillet it has been getting redder and redder     HPI [name] is a 55 year old female with PMH of HTN, uncontrolled type 2 DM with neuropathy, sigmoid colon ca. S/p partial colectomy, schizoaffective disorder and obesity who presents for follow up of her chronic medical conditions.  DM: HbA1C is 9.4 in the office today. She states that she takes Lantus 22 U qHS and Metformin, but compliance is questionable. Denies any polyuria or polydipsia. Has gained 6 lbs since last visit.  HTN: BP is uncontrolled. Takes medications regularly. Patient denies headache, dizziness, chest pain, dyspnea or palpitations.  She needs to schedule appointment with Psychiatry for follow up of Schizoaffective disorder. Does not appear to have hallucinations. But she states that she remains concerned that she would get pregnant when she engages in sexual activity, although she has been in Menopause for last 10 years.  Burn injury: She had a burn injury over right hand in the last week while cooking, which has been healing well. She has noticed blister, which ruptured and has a red spot now.   Past Medical History:  Diagnosis Date   Charcot's joint of foot, left    Depression    Diabetes mellitus without complication ([place])    Hyperlipidemia    Hypertension    Neuromuscular disorder ([place])    Schizo affective schizophrenia ([place])    abilify and pazil    Past Surgical History:  Procedure Laterality Date   CHOLECYSTECTOMY     COLONOSCOPY WITH PROPOFOL N/A 05/26/2019   Procedure: COLONOSCOPY WITH PROPOFOL;  Surgeon: Toledo, Benay Pike, MD;  Location: [facility] ENDOSCOPY;  Service: Gastroenterology;  Laterality: N/A;   ESOPHAGOGASTRODUODENOSCOPY (EGD) WITH  PROPOFOL N/A 05/26/2019   Procedure: ESOPHAGOGASTRODUODENOSCOPY (EGD) WITH PROPOFOL;  Surgeon: Toledo, Benay Pike, MD;  Location: [facility] ENDOSCOPY;  Service: Gastroenterology;  Laterality: N/A;   IRRIGATION AND DEBRIDEMENT FOOT Right 02/26/2016   Procedure: RIGHT 2ND TOE DEBRIDEMENT;  Surgeon: Samara Deist, DPM;  Location: [facility] ORS;  Service: Podiatry;  Laterality: Right;   IRRIGATION AND DEBRIDEMENT FOOT Left 12/13/2018   Procedure: IRRIGATION AND DEBRIDEMENT FOOT;  Surgeon: Caroline More, DPM;  Location: [facility] ORS;  Service: Podiatry;  Laterality: Left;    Family History  Problem Relation Age of Onset   Breast cancer Cousin        maternal cousin   Hypertension Mother    Hyperthyroidism Mother    Dementia Mother    Prostate cancer Father    Diabetes Maternal Grandmother    Hypertension Maternal Grandmother    Cancer Maternal Grandmother        unknown type   Diabetes Maternal Grandfather    Hypertension Maternal Grandfather    Diabetes Paternal Grandmother    Hypertension Paternal Grandmother    Diabetes Paternal Grandfather    Hypertension Paternal Grandfather     Social History   Socioeconomic History   Marital status: Divorced    Spouse name: Not on file   Number of children: 2   Years of education: Not on file   Highest education level: Not on file  Occupational History   Not on file  Tobacco Use   Smoking status: Every Day    Packs/day:  1.00    Years: 30.00    Pack years: 30.00    Types: Cigarettes    Last attempt to quit: 05/02/2009    Years since quitting: 11.4   Smokeless tobacco: Never  Vaping Use   Vaping Use: Never used  Substance and Sexual Activity   Alcohol use: No   Drug use: No   Sexual activity: Not Currently  Other Topics Concern   Not on file  Social History Narrative   At home with mom, 31. Lives in brothers house who moved out. Sister comes by every other day to check on patient and mom.   Social Determinants of Health   Financial Resource  Strain: Not on file  Food Insecurity: Not on file  Transportation Needs: Not on file  Physical Activity: Not on file  Stress: Not on file  Social Connections: Not on file  Intimate Partner Violence: Not on file    Outpatient Medications Prior to Visit  Medication Sig Dispense Refill   acetaminophen (TYLENOL) 325 MG tablet Take 2 tablets (650 mg total) by mouth every 6 (six) hours as needed for mild pain.     ARIPiprazole (ABILIFY) 15 MG tablet Take 1 tablet (15 mg total) by mouth daily. 30 tablet 0   atorvastatin (LIPITOR) 40 MG tablet Take 1 tablet (40 mg total) by mouth daily. 90 tablet 1   blood glucose meter kit and supplies Dispense based on patient and insurance preference. Use up to four times daily as directed. (FOR ICD-10 E10.9, E11.9). 1 each 0   buPROPion (WELLBUTRIN XL) 300 MG 24 hr tablet TAKE 1 TABLET BY MOUTH EACH MORNING. 30 tablet 0   colestipol (COLESTID) 1 g tablet TAKE TWO (2) TABLETS BY MOUTH TWICE DAILY 120 tablet 10   furosemide (LASIX) 40 MG tablet Take 1 tablet (40 mg total) by mouth daily. 30 tablet 2   gabapentin (NEURONTIN) 300 MG capsule Take 1 capsule (300 mg total) by mouth at bedtime. 90 capsule 1   Lancets (ONETOUCH DELICA PLUS [id number]) MISC USE TO TEST 3 TIMES DAILY. 100 each 0   metFORMIN (GLUCOPHAGE) 1000 MG tablet Take 1 tablet (1,000 mg total) by mouth daily with breakfast. 90 tablet 1   metoprolol succinate (TOPROL-XL) 50 MG 24 hr tablet Take 1 tablet (50 mg total) by mouth daily. Take with or immediately following a meal. 90 tablet 1   mupirocin ointment (BACTROBAN) 2 % APPLY TO AFFECTED AREA TWICE DAILY 22 g 10   ondansetron (ZOFRAN) 4 MG tablet Take 1 tablet (4 mg total) by mouth every 6 (six) hours. (Patient taking differently: Take 4 mg by mouth every 8 (eight) hours as needed for nausea or vomiting.) 12 tablet 0   ONETOUCH VERIO test strip CHECK BLOOD SUGAR 3 TIMES DAILY. 100 strip 0   pantoprazole (PROTONIX) 40 MG tablet Take 1 tablet (40 mg  total) by mouth daily. 90 tablet 1   polyethylene glycol-electrolytes (TRILYTE) 420 g solution Take 4,000 mLs by mouth as directed. 4000 mL 0   traZODone (DESYREL) 50 MG tablet Take 1 tablet (50 mg total) by mouth at bedtime as needed. for sleep 30 tablet 3   hydrochlorothiazide (HYDRODIURIL) 12.5 MG tablet Take 1 tablet (12.5 mg total) by mouth daily. 90 tablet 1   insulin glargine (LANTUS SOLOSTAR) 100 UNIT/ML Solostar Pen Inject 25 Units into the skin at bedtime. 15 mL 0   No facility-administered medications prior to visit.    No Known Allergies  ROS Review of Systems  Constitutional:  Negative for chills and fever.  HENT:  Negative for congestion, sinus pressure, sinus pain and sore throat.   Eyes:  Negative for pain and discharge.  Respiratory:  Negative for cough and shortness of breath.   Cardiovascular:  Negative for chest pain and palpitations.  Gastrointestinal:  Negative for abdominal pain, constipation, nausea and vomiting.  Endocrine: Negative for polydipsia and polyuria.  Genitourinary:  Negative for dysuria and hematuria.  Musculoskeletal:  Positive for arthralgias. Negative for neck pain and neck stiffness.  Skin:  Negative for rash.  Neurological:  Negative for dizziness and weakness.  Psychiatric/Behavioral:  Negative for agitation and behavioral problems. The patient is nervous/anxious.      Objective:    Physical Exam Vitals reviewed.  Constitutional:      General: She is not in acute distress.    Appearance: She is obese. She is not diaphoretic.     Comments: Uses walker  HENT:     Head: Normocephalic and atraumatic.     Nose: Nose normal.     Mouth/Throat:     Mouth: Mucous membranes are moist.  Eyes:     General: No scleral icterus.    Extraocular Movements: Extraocular movements intact.     Pupils: Pupils are equal, round, and reactive to light.  Cardiovascular:     Rate and Rhythm: Normal rate and regular rhythm.     Pulses: Normal pulses.      Heart sounds: Normal heart sounds. No murmur heard. Pulmonary:     Breath sounds: Normal breath sounds. No wheezing or rales.  Abdominal:     Palpations: Abdomen is soft.     Tenderness: There is no abdominal tenderness.  Musculoskeletal:     Cervical back: Neck supple. No tenderness.     Right lower leg: No edema.     Left lower leg: No edema.     Comments: Diabetic shoe on left side  Skin:    General: Skin is warm.     Findings: Erythema (Over left hand (burn injury)) present.  Neurological:     General: No focal deficit present.     Mental Status: She is alert and oriented to person, place, and time.  Psychiatric:        Mood and Affect: Mood normal.        Behavior: Behavior normal.    BP (!) 142/82 (BP Location: Left Arm, Patient Position: Sitting, Cuff Size: Normal)   Pulse 96   Resp 18   Ht '5\' 10"'  (1.778 m)   Wt 249 lb 1.9 oz (113 kg)   SpO2 99%   BMI 35.74 kg/m  Wt Readings from Last 3 Encounters:  10/21/20 249 lb 1.9 oz (113 kg)  08/03/20 247 lb (112 kg)  06/24/20 243 lb 1.9 oz (110.3 kg)     Health Maintenance Due  Topic Date Due   COVID-19 Vaccine (1) Never done   Pneumococcal Vaccine 83-10 Years old (1 - PCV) Never done   OPHTHALMOLOGY EXAM  Never done   Zoster Vaccines- Shingrix (1 of 2) Never done   MAMMOGRAM  08/17/2017   FOOT EXAM  03/17/2020    There are no preventive care reminders to display for this patient.  Lab Results  Component Value Date   TSH 2.00 11/07/2017   Lab Results  Component Value Date   WBC 8.3 05/09/2020   HGB 9.2 (L) 05/09/2020   HCT 28.7 (L) 05/09/2020   MCV 93.8 05/09/2020   PLT 200 05/09/2020  Lab Results  Component Value Date   NA 137 05/11/2020   K 4.6 05/11/2020   CO2 21 (L) 05/11/2020   GLUCOSE 193 (H) 05/11/2020   BUN 21 (H) 05/11/2020   CREATININE 1.09 (H) 05/11/2020   BILITOT 0.4 03/03/2020   ALKPHOS 139 (H) 03/03/2020   AST 17 03/03/2020   ALT 22 03/03/2020   PROT 7.8 03/03/2020   ALBUMIN 3.3 (L)  05/11/2020   CALCIUM 9.0 05/11/2020   ANIONGAP 8 05/11/2020   Lab Results  Component Value Date   CHOL 175 02/23/2020   Lab Results  Component Value Date   HDL 42 02/23/2020   Lab Results  Component Value Date   LDLCALC 78 02/23/2020   Lab Results  Component Value Date   TRIG 275 (H) 02/23/2020   Lab Results  Component Value Date   CHOLHDL 4.2 02/23/2020   Lab Results  Component Value Date   HGBA1C 10.1 (H) 05/08/2020      Assessment & Plan:   Problem List Items Addressed This Visit       Cardiovascular and Mediastinum   Essential hypertension (Chronic)    BP Readings from Last 1 Encounters:  10/21/20 (!) 142/82  uncontrolled with HCTZ and Metoprolol Added Losartan (h/o DM) Also takes Lasix for LE swelling, will address if she needs it further in the next visit Counseled for compliance with the medications Advised DASH diet and moderate exercise/walking, at least 150 mins/week        Relevant Medications   losartan-hydrochlorothiazide (HYZAAR) 50-12.5 MG tablet   Other Relevant Orders   CBC   Lipid panel     Digestive   GERD (gastroesophageal reflux disease)    On Pantoprazole         Endocrine   DM (diabetes mellitus), type 2, 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HCC) - Primary (Chronic)    HbA1C: 9.4, better than prior On Lantus 22 U QD, Humalog 5 U BID PRN (blood glucose > 250) and Metformin 1000 mg QD Increased Lantus to 30 U qHS Advised to follow diabetic diet On statin F/u CMP and lipid panel Diabetic foot exam: Referred to Podiatry Diabetic eye exam: Advised to follow up with Ophthalmology for diabetic eye exam       Relevant Medications   losartan-hydrochlorothiazide (HYZAAR) 50-12.5 MG tablet   insulin glargine (LANTUS SOLOSTAR) 100 UNIT/ML Solostar Pen   Other Relevant Orders   CMP14+EGFR   Lipid panel   Hemoglobin A1c     Other   Schizoaffective disorder (HCC)    On Abilify and Wellbutrin, although unclear whether she  is taking them On Trazodone Followed by Psychiatry - needs to have follow up visit and clarification of medications       Relevant Orders   TSH + free T4   Vitamin D (25 hydroxy)   Other Visit Diagnoses     Superficial burn of back of right hand, initial encounter  Healing well, advised to apply Neosporin for itching or burning pain          Meds ordered this encounter  Medications   losartan-hydrochlorothiazide (HYZAAR) 50-12.5 MG tablet    Sig: Take 1 tablet by mouth daily.    Dispense:  90 tablet    Refill:  0    Please cancel old HCTZ prescription.   insulin glargine (LANTUS SOLOSTAR) 100 UNIT/ML Solostar Pen    Sig: Inject 30 Units into the skin at bedtime.    Dispense:  15 mL    Refill:  0  Follow-up: Return in about 3 months (around 01/21/2021) for HTN and DM.    Lindell Spar, MD

## 2020-10-21 NOTE — Assessment & Plan Note (Addendum)
BP Readings from Last 1 Encounters:  10/21/20 (!) 142/82   uncontrolled with HCTZ and Metoprolol Added Losartan (h/o DM) Also takes Lasix for LE swelling, will address if she needs it further in the next visit Counseled for compliance with the medications Advised DASH diet and moderate exercise/walking, at least 150 mins/week

## 2020-10-21 NOTE — Assessment & Plan Note (Signed)
On Pantoprazole 

## 2020-10-21 NOTE — Assessment & Plan Note (Signed)
On Abilify and Wellbutrin, although unclear whether she is taking them ?On Trazodone ?Followed by Psychiatry - needs to have follow up visit and clarification of medications ?

## 2020-10-21 NOTE — Patient Instructions (Addendum)
Please start taking Losartan-HCTZ instead of HCTZ.  Start taking Lantus 30 U instead 22 U daily. Please eat at least 3 times in a day. Small, frequent meals are preferred. Avoid skipping any meal while taking insulin.  Please continue to take other medications as prescribed.  Please continue to follow low carb and low salt diet.  Please get fasting blood tests done before the next visit.

## 2020-10-29 ENCOUNTER — Ambulatory Visit (INDEPENDENT_AMBULATORY_CARE_PROVIDER_SITE_OTHER): Payer: Medicare Other | Admitting: *Deleted

## 2020-10-29 DIAGNOSIS — E1165 Type 2 diabetes mellitus with hyperglycemia: Secondary | ICD-10-CM | POA: Diagnosis not present

## 2020-10-29 DIAGNOSIS — IMO0002 Reserved for concepts with insufficient information to code with codable children: Secondary | ICD-10-CM

## 2020-10-29 DIAGNOSIS — I10 Essential (primary) hypertension: Secondary | ICD-10-CM

## 2020-10-29 DIAGNOSIS — E1149 Type 2 diabetes mellitus with other diabetic neurological complication: Secondary | ICD-10-CM

## 2020-10-29 NOTE — Patient Instructions (Signed)
Visit Information  PATIENT GOALS:  Goals Addressed             This Visit's Progress    Monitor and Manage My Blood Sugar-Diabetes Type 2       Timeframe:  Long-Range Goal Priority:  High Start Date:           07/02/2020                  Expected End Date:   01/02/2021                    Follow Up Date- 11/26/2020  Reminder- attend scheduled eye exam Reschedule visit with podiatrist Adam McDonald (foot dr) and call for transportation Keep all medications refilled in timely manner so you don't run out check blood sugar at prescribed times check blood sugar if I feel it is too high or too low take the blood sugar log and glucometer to all doctor visits Follow low carb diet(be careful with bread, pasta, rice, sweets) Try to exercise some each day- walking is good Call your doctor if you have any further urinary symptoms   Why is this important?   Checking your blood sugar at home helps to keep it from getting very high or very low.  Writing the results in a diary or log helps the doctor know how to care for you.  Your blood sugar log should have the time, date and the results.  Also, write down the amount of insulin or other medicine that you take.  Other information, like what you ate, exercise done and how you were feeling, will also be helpful.     Notes:       Track and Manage My Blood Pressure-Hypertension       Timeframe:  Long-Range Goal Priority:  High Start Date:        07/02/2020                     Expected End Date:     01/02/2021                  Follow Up Date 11/26/2020  check blood pressure daily and record when you get your cuff Continue to attend all scheduled provider appointments Dr. Posey Pronto and reschedule podiatrist visit and set up transportation Call provider office for new concerns, questions, or BP outside discussed parameters Please use your friend's blood pressure cuff to check BP daily and get new cuff from [facility] (per catalog) Choose low  sodium foods, avoid salty snacks such as chips. Keep medications refilled in timely manner so you do not run out    Why is this important?   You won't feel high blood pressure, but it can still hurt your blood vessels.  High blood pressure can cause heart or kidney problems. It can also cause a stroke.  Making lifestyle changes like losing a little weight or eating less salt will help.  Checking your blood pressure at home and at different times of the day can help to control blood pressure.  If the doctor prescribes medicine remember to take it the way the doctor ordered.  Call the office if you cannot afford the medicine or if there are questions about it.     Notes:          Patient verbalizes understanding of instructions provided today and agrees to view in [place].   Telephone follow up appointment with care management team  member scheduled for:  7/28/2022Diabetes Mellitus and [place] care is an important part of your health, especially when you have diabetes. Diabetes may cause you to have problems because of poor blood flow (circulation) to your feet and legs, which can cause your skin to: Become thinner and drier. Break more easily. Heal more slowly. Peel and crack. You may also have nerve damage (neuropathy) in your legs and feet, causing decreased feeling in them. This means that you may not notice minor injuries to your feet that could lead to more serious problems. Noticing and addressing any potential problems early is the best wayto prevent future foot problems. How to care for your feet Foot hygiene  Wash your feet daily with warm water and mild soap. Do not use hot water. Then, pat your feet and the areas between your toes until they are completely dry. Do not soak your feet as this can dry your skin. Trim your toenails straight across. Do not dig under them or around the cuticle. File the edges of your nails with an emery board or nail file. Apply a  moisturizing lotion or petroleum jelly to the skin on your feet and to dry, brittle toenails. Use lotion that does not contain alcohol and is unscented. Do not apply lotion between your toes.  Shoes and socks Wear clean socks or stockings every day. Make sure they are not too tight. Do not wear knee-high stockings since they may decrease blood flow to your legs. Wear shoes that fit properly and have enough cushioning. Always look in your shoes before you put them on to be sure there are no objects inside. To break in new shoes, wear them for just a few hours a day. This prevents injuries on your feet. Wounds, scrapes, corns, and calluses  Check your feet daily for blisters, cuts, bruises, sores, and redness. If you cannot see the bottom of your feet, use a mirror or ask someone for help. Do not cut corns or calluses or try to remove them with medicine. If you find a minor scrape, cut, or break in the skin on your feet, keep it and the skin around it clean and dry. You may clean these areas with mild soap and water. Do not clean the area with peroxide, alcohol, or iodine. If you have a wound, scrape, corn, or callus on your foot, look at it several times a day to make sure it is healing and not infected. Check for: Redness, swelling, or pain. Fluid or blood. Warmth. Pus or a bad smell.  General tips Do not cross your legs. This may decrease blood flow to your feet. Do not use heating pads or hot water bottles on your feet. They may burn your skin. If you have lost feeling in your feet or legs, you may not know this is happening until it is too late. Protect your feet from hot and cold by wearing shoes, such as at the beach or on hot pavement. Schedule a complete foot exam at least once a year (annually) or more often if you have foot problems. Report any cuts, sores, or bruises to your health care provider immediately. Where to find more information American Diabetes Association:  www.diabetes.org Association of Diabetes Care & Education Specialists: www.diabeteseducator.org Contact a health care provider if: You have a medical condition that increases your risk of infection and you have any cuts, sores, or bruises on your feet. You have an injury that is not healing. You have redness  on your legs or feet. You feel burning or tingling in your legs or feet. You have pain or cramps in your legs and feet. Your legs or feet are numb. Your feet always feel cold. You have pain around any toenails. Get help right away if: You have a wound, scrape, corn, or callus on your foot and: You have pain, swelling, or redness that gets worse. You have fluid or blood coming from the wound, scrape, corn, or callus. Your wound, scrape, corn, or callus feels warm to the touch. You have pus or a bad smell coming from the wound, scrape, corn, or callus. You have a fever. You have a red line going up your leg. Summary Check your feet every day for blisters, cuts, bruises, sores, and redness. Apply a moisturizing lotion or petroleum jelly to the skin on your feet and to dry, brittle toenails. Wear shoes that fit properly and have enough cushioning. If you have foot problems, report any cuts, sores, or bruises to your health care provider immediately. Schedule a complete foot exam at least once a year (annually) or more often if you have foot problems. This information is not intended to replace advice given to you by your health care provider. Make sure you discuss any questions you have with your healthcare provider. Document Revised: 11/07/2019 Document Reviewed: 11/07/2019 Elsevier Patient Education  2022 [place] [facility], BSN RN Case Advertising copywriter Primary Care [phone]

## 2020-10-29 NOTE — Chronic Care Management (AMB) (Signed)
Chronic Care Management   CCM RN Visit Note  10/29/2020 Name: [name] MRN: [id number] DOB: [birth year]  Subjective: [name] is a 55 y.o. year old female who is a primary care patient of Lindell Spar, MD. The care management team was consulted for assistance with disease management and care coordination needs.    Engaged with patient by telephone for follow up visit in response to provider referral for case management and/or care coordination services.   Consent to Services:  The patient was given the following information about Chronic Care Management services today, agreed to services, and gave verbal consent: 1. CCM service includes personalized support from designated clinical staff supervised by the primary care provider, including individualized plan of care and coordination with other care providers 2. 24/7 contact phone numbers for assistance for urgent and routine care needs. 3. Service will only be billed when office clinical staff spend 20 minutes or more in a month to coordinate care. 4. Only one practitioner may furnish and bill the service in a calendar month. 5.The patient may stop CCM services at any time (effective at the end of the month) by phone call to the office staff. 6. The patient will be responsible for cost sharing (co-pay) of up to 20% of the service fee (after annual deductible is met). Patient agreed to services and consent obtained.  Patient agreed to services and verbal consent obtained.   Assessment: Review of patient past medical history, allergies, medications, health status, including review of consultants reports, laboratory and other test data, was performed as part of comprehensive evaluation and provision of chronic care management services.   SDOH (Social Determinants of Health) assessments and interventions performed:    CCM Care Plan  No Known Allergies  Outpatient Encounter Medications as of 10/29/2020  Medication Sig    acetaminophen (TYLENOL) 325 MG tablet Take 2 tablets (650 mg total) by mouth every 6 (six) hours as needed for mild pain.   ARIPiprazole (ABILIFY) 15 MG tablet Take 1 tablet (15 mg total) by mouth daily.   atorvastatin (LIPITOR) 40 MG tablet Take 1 tablet (40 mg total) by mouth daily.   blood glucose meter kit and supplies Dispense based on patient and insurance preference. Use up to four times daily as directed. (FOR ICD-10 E10.9, E11.9).   buPROPion (WELLBUTRIN XL) 300 MG 24 hr tablet TAKE 1 TABLET BY MOUTH EACH MORNING.   colestipol (COLESTID) 1 g tablet TAKE TWO (2) TABLETS BY MOUTH TWICE DAILY   furosemide (LASIX) 40 MG tablet Take 1 tablet (40 mg total) by mouth daily.   gabapentin (NEURONTIN) 300 MG capsule Take 1 capsule (300 mg total) by mouth at bedtime.   insulin glargine (LANTUS SOLOSTAR) 100 UNIT/ML Solostar Pen Inject 30 Units into the skin at bedtime.   Lancets (ONETOUCH DELICA PLUS HYIFOY77A) MISC USE TO TEST 3 TIMES DAILY.   losartan-hydrochlorothiazide (HYZAAR) 50-12.5 MG tablet Take 1 tablet by mouth daily.   metFORMIN (GLUCOPHAGE) 1000 MG tablet Take 1 tablet (1,000 mg total) by mouth daily with breakfast.   metoprolol succinate (TOPROL-XL) 50 MG 24 hr tablet Take 1 tablet (50 mg total) by mouth daily. Take with or immediately following a meal.   mupirocin ointment (BACTROBAN) 2 % APPLY TO AFFECTED AREA TWICE DAILY   ondansetron (ZOFRAN) 4 MG tablet Take 1 tablet (4 mg total) by mouth every 6 (six) hours. (Patient taking differently: Take 4 mg by mouth every 8 (eight) hours as needed for nausea or vomiting.)  ONETOUCH VERIO test strip CHECK BLOOD SUGAR 3 TIMES DAILY.   pantoprazole (PROTONIX) 40 MG tablet Take 1 tablet (40 mg total) by mouth daily.   polyethylene glycol-electrolytes (TRILYTE) 420 g solution Take 4,000 mLs by mouth as directed.   traZODone (DESYREL) 50 MG tablet Take 1 tablet (50 mg total) by mouth at bedtime as needed. for sleep   No facility-administered  encounter medications on file as of 10/29/2020.    Patient Active Problem List   Diagnosis Date Noted   GERD (gastroesophageal reflux disease) 10/21/2020   Lactose intolerance 08/03/2020   Charcot's joint of foot, left    Slurred speech 05/09/2020   Schizophrenia ([place]) 02/20/2020   Chronic diarrhea 11/25/2019   History of colon cancer 11/25/2019   Iron deficiency anemia 07/25/2019   S/P partial colectomy 07/17/2019   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[place]) 05/28/2019   Anemia    Neuropathic foot ulcer, left, with fat layer exposed ([place]) 02/05/2019   Morbid obesity ([place]) 05/10/2017   Long-term use of high-risk medication 05/10/2017   Hyperlipidemia associated with type 2 diabetes mellitus ([place]) 06/27/2016   Hammer toe of second toe of right foot 04/20/2016   Essential hypertension 06/29/2015   Schizoaffective disorder ([place]) 06/29/2015   DM (diabetes mellitus), type 2, 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[place]) 25/36/6440   Polyneuropathy 04/02/2014    Conditions to be addressed/monitored:HTN and DMII  Care Plan : RNCM: Diabetes Type 2 (Adult)  Updates made by Kassie Mends, RN since 10/29/2020 12:00 AM     Problem: RNCM: Glycemic Management (Diabetes, Type 2)   Priority: High     Long-Range Goal: RNCM: Glycemic Management Optimized   Start Date: 07/02/2020  Expected End Date: 01/02/2021  This Visit's Progress: On track  Recent Progress: Not on track  Priority: High  Note:   Objective:  Lab Results  Component Value Date   HGBA1C 10.1 (H) 05/08/2020   Lab Results  Component Value Date   CREATININE 1.09 (H) 05/11/2020   CREATININE 1.34 (H) 05/10/2020   CREATININE 1.95 (H) 05/09/2020   No results found for: EGFR Current Barriers:  Knowledge Deficits related to basic Diabetes pathophysiology and self care/management- patient reports her friend set up eye exam for near future so they can ride together, pt now has glucometer and is checking CBG BID with fasting readings  140-190's range and random readings 190-230. pt did have medications switched to [place] in [place] (she does not like mail order citing this does not work well for her), reports she "let metformin run out because I forgot to refill it"  pt states she is refilling metformin today, pt reports she did send in urine specimen and UTI is now resolved, no symptoms reported, patient did not see podiatrist citing "transportation got messed up"  pt still plans to reschedule, pt reports psychiatrist she wanted to see has closed, she is seeing Dr. Hoyle Barr at [facility] and states she may switch to another provider, she is still deciding what would be a good fit for her. 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 Pt  continues to have unprotected sex with a man in her apartment complex, this has been going on for awhile, pt has spoken with PCP about this. Unable to independently manage DM2- CBG - is now checking CBG BID per pt report Does not adhere to provider recommendations re: take medications, keep appointments, adhere to prescribed diet- pt reports she is eating better with improved appetite (eating tuna, chicken, vegetables) although "sometimes I eat the wrong thing"  pt does not always keep transportation appointments. Patient reports trying to do better with attending all scheduled appointments Does not adhere to prescribed medication regimen- pt has all medications, finished antibiotic and reports UTI resolved Lacks  social connections Unable to perform IADLs independently Does not always maintain contact with provider office Does not contact provider office for questions/concerns Case Manager Clinical Goal(s):  Collaboration with Dr.Kutwik Posey Pronto regarding development and update of comprehensive plan of care as evidenced by provider attestation and co-signature Inter-disciplinary care team collaboration (see longitudinal plan of care) Over the next 120 days, patient will demonstrate improved adherence to prescribed treatment plan for diabetes self care/management as evidenced by:  daily monitoring and recording of CBG  adherence to ADA/ carb modified diet adherence to prescribed medication regimen Interventions:  Reinforced education with patient about basic DM disease process Reviewed medications with patient and discussed importance of medication adherence and keeping timely refills Reviewed plans with patient for ongoing care management follow up and provided patient with direct contact information for care management team- [phone] Reinforced with patient signs/ symptoms hypo and hyperglycemia and importance of correct treatment Reviewed scheduled/upcoming provider appointments including:  rescheduling podiatry appointment with Lanae Crumbly and follow up with primary care provider (saw PCP yesterday 10/28/20) Reinforced with patient importance of checking cbg TID and record Reviewed carbohydrate modified diet Review of patient status, including review of consultants reports, relevant laboratory and other test results, and medications completed. Reviewed possible exposure to STD's with having unprotected sex and encouraged to use protection Reviewed importance of calling doctor for any further urinary symptoms Reviewed prevention management for UTI Patient Goals/Self-Care Activities Over the next 120 days, patient will:  Self administers oral medications as prescribed Attends all scheduled  provider appointments Checks blood sugars as prescribed and utilize hReminder- attend scheduled eye exam Reschedule visit with podiatrist Lanae Crumbly (foot dr) and call for transportation Keep all medications refilled in timely manner so you don't run out check blood sugar at prescribed times check blood sugar if I feel it is too high or too low take the blood sugar log and glucometer to all doctor visits Follow low carb diet(be careful with bread, pasta, rice, sweets) Try to exercise some each day- walking is good Call your doctor if you have any further urinary symptomsyper and hypoglycemia protocol as needed Follow Up Plan: Telephone follow up appointment with care management team member scheduled for:  11/26/2020    Care Plan : RNCM: Hypertension (Adult)  Updates made by Kassie Mends, RN since 10/29/2020 12:00 AM     Problem: RNCM: Hypertension (Hypertension)   Priority: Medium     Goal: RNCM: Hypertension Monitored   Start Date: 07/02/2020  Expected End Date: 01/02/2021  Recent Progress: Not on track  Priority: Medium  Note:   Objective:  Last practice recorded BP readings:  BP Readings from Last 3 Encounters:  05/11/20 (!) 156/74  04/17/20 134/76  03/04/20 (!) 170/80   Most recent eGFR/CrCl: No results found for: EGFR  No components found for: CRCL Current Barriers:  Knowledge Deficits related to basic understanding of hypertension pathophysiology and self care management- does not check BP daily, has not gotten BP cuff from [facility] through her benefits catalog, reports she is going to 10/30/20 when benefit starts over. Knowledge Deficits related to understanding of medications prescribed for management of hypertension Non-adherence to scheduled provider appointments- client has missed several scheduled appointments such as podiatrist citing "transportation mix up", pt to reschedule podiatrist appointment Transportation barriers- pt uses [employer] and relatives assist  "if they aren't mad at me" Cognitive Deficits- seems to have difficulty completing tasks and concentrating Limited Social Support- social worker involved Film/video editor Unable to independently management of HTN- pt previously stated she has new BP cuff and now states she does not and will be getting from insurance company at beginning of the month (July).  States " will use my friend's cuff until I get one"  Does not adhere to provider recommendations re: dietary restrictions, medications management and lifestyle changes  Does not always attend all scheduled provider appointments Does not always adhere to prescribed medication regimen- pt did switch all medications to [place], sometimes forgets to refill medications in timely manner Lacks social connections- has reported dysfunction in her family, relationships are not good, Education officer, museum is involved. Does not always maintain contact with provider office- there are several appointments and transportation to be rescheduled, pt has not completed, states she is able and does not give reason why she has not done so. Does not contact provider office for questions/concerns Case Manager Clinical Goal(s):  Over the next 120 days, patient will verbalize understanding of plan for hypertension management Over the next 120 days, patient will attend all scheduled medical appointments: pt saw Dr. Posey Pronto 10/28/20, to reschedule podiatrist Over the next 120 days, patient will demonstrate improved adherence to prescribed treatment plan for hypertension as evidenced by taking all medications as prescribed, monitoring and recording blood pressure as directed, adhering to low sodium/DASH diet Over the next 120 days, patient will demonstrate improved health management independence as evidenced by checking blood pressure as directed and notifying PCP if SBP>160 or DBP > 90, taking all medications as prescribed, and adhering to a low sodium diet as  discussed. Over the next 120 days, patient will verbalize basic understanding of hypertension disease process and self health management plan as evidenced by compliance with medications, diet, and working with CCM team to maintain health and well being.  Interventions:  Collaboration with Dr. Ihor Dow regarding development and update of comprehensive plan of care as evidenced by provider attestation and co-signature Inter-disciplinary care team collaboration (see longitudinal plan of care) Evaluation of current treatment plan related to hypertension self management and patient's adherence to plan as established by provider. Provided education to patient re: assessed pt does not have new blood pressure cuff and importance of using friend's BP cuff until she can get her's from [employer], importance of checking BP daily and recording, DASH diet, complications of uncontrolled blood pressure and calling PCP for readings outside normal limits Reviewed choosing low sodium foods, avoid salty snacks Reinforced choosing fresh and frozen vegetables if possible, lean meats Reviewed medications with patient and discussed importance of compliance Reinforced plans with patient for ongoing care management follow up Reviewed importance of keeping all medications refilled on time Reinforced importance of checking blood pressure daily and recording  Patient Goals/Self-Care Activities Over the next 120 days, patient will:  - Self administers medications as prescribed Check blood pressure daily and record  when you get your cuff Continue to attend all scheduled provider appointments Dr. Posey Pronto and reschedule podiatrist visit and set up transportation Call provider office for new concerns, questions, or BP outside discussed parameters Please use your friend's blood pressure cuff to check BP daily and get new cuff from [facility] (per catalog) Choose low sodium foods, avoid salty snacks such as  chips. Keep medications refilled in timely manner so you do not run out  Follow Up Plan: Telephone follow up appointment with care management team member scheduled for:  11/26/2020     Plan: Telephone follow up appointment with care management team member scheduled for:  11/26/2020  Jacqlyn Larsen [facility], BSN RN Case Manager [place] [facility] [phone]

## 2020-11-03 ENCOUNTER — Other Ambulatory Visit: Payer: Self-pay

## 2020-11-03 ENCOUNTER — Emergency Department (HOSPITAL_COMMUNITY): Payer: Medicare Other

## 2020-11-03 ENCOUNTER — Emergency Department (HOSPITAL_COMMUNITY)
Admission: EM | Admit: 2020-11-03 | Discharge: 2020-11-03 | Disposition: A | Discharge status: Home / Self Care | Payer: Medicare Other | Attending: Emergency Medicine | Admitting: Emergency Medicine

## 2020-11-03 ENCOUNTER — Other Ambulatory Visit: Payer: Self-pay | Admitting: Internal Medicine

## 2020-11-03 DIAGNOSIS — R4020 Unspecified coma: Secondary | ICD-10-CM

## 2020-11-03 DIAGNOSIS — R0602 Shortness of breath: Secondary | ICD-10-CM | POA: Diagnosis not present

## 2020-11-03 DIAGNOSIS — F17210 Nicotine dependence, cigarettes, uncomplicated: Secondary | ICD-10-CM | POA: Diagnosis not present

## 2020-11-03 DIAGNOSIS — Z85038 Personal history of other malignant neoplasm of large intestine: Secondary | ICD-10-CM | POA: Diagnosis not present

## 2020-11-03 DIAGNOSIS — R6889 Other general symptoms and signs: Secondary | ICD-10-CM | POA: Diagnosis not present

## 2020-11-03 DIAGNOSIS — Z794 Long term (current) use of insulin: Secondary | ICD-10-CM | POA: Insufficient documentation

## 2020-11-03 DIAGNOSIS — R55 Syncope and collapse: Secondary | ICD-10-CM | POA: Insufficient documentation

## 2020-11-03 DIAGNOSIS — R2242 Localized swelling, mass and lump, left lower limb: Secondary | ICD-10-CM | POA: Insufficient documentation

## 2020-11-03 DIAGNOSIS — Z7984 Long term (current) use of oral hypoglycemic drugs: Secondary | ICD-10-CM | POA: Diagnosis not present

## 2020-11-03 DIAGNOSIS — Z79899 Other long term (current) drug therapy: Secondary | ICD-10-CM | POA: Insufficient documentation

## 2020-11-03 DIAGNOSIS — I10 Essential (primary) hypertension: Secondary | ICD-10-CM | POA: Diagnosis not present

## 2020-11-03 DIAGNOSIS — F251 Schizoaffective disorder, depressive type: Secondary | ICD-10-CM

## 2020-11-03 DIAGNOSIS — Z743 Need for continuous supervision: Secondary | ICD-10-CM | POA: Diagnosis not present

## 2020-11-03 DIAGNOSIS — E1142 Type 2 diabetes mellitus with diabetic polyneuropathy: Secondary | ICD-10-CM | POA: Insufficient documentation

## 2020-11-03 DIAGNOSIS — R609 Edema, unspecified: Secondary | ICD-10-CM | POA: Diagnosis not present

## 2020-11-03 DIAGNOSIS — N3000 Acute cystitis without hematuria: Secondary | ICD-10-CM

## 2020-11-03 DIAGNOSIS — R739 Hyperglycemia, unspecified: Secondary | ICD-10-CM | POA: Diagnosis not present

## 2020-11-03 DIAGNOSIS — R4702 Dysphasia: Secondary | ICD-10-CM | POA: Diagnosis not present

## 2020-11-03 LAB — URINALYSIS, ROUTINE W REFLEX MICROSCOPIC
Bilirubin Urine: NEGATIVE
Glucose, UA: 50 mg/dL — AB
Ketones, ur: NEGATIVE mg/dL
Nitrite: POSITIVE — AB
Protein, ur: NEGATIVE mg/dL
Specific Gravity, Urine: 1.012 (ref 1.005–1.030)
WBC, UA: >50 WBC/hpf — ABNORMAL HIGH (ref 0–5)
pH: 5 (ref 5.0–8.0)

## 2020-11-03 LAB — COMPREHENSIVE METABOLIC PANEL
ALT: 19 U/L (ref 0–44)
AST: 16 U/L (ref 15–41)
Albumin: 3.8 g/dL (ref 3.5–5.0)
Alkaline Phosphatase: 146 U/L — ABNORMAL HIGH (ref 38–126)
Anion gap: 2 — ABNORMAL LOW (ref 5–15)
BUN: 28 mg/dL — ABNORMAL HIGH (ref 6–20)
CO2: 29 mmol/L (ref 22–32)
Calcium: 8.8 mg/dL — ABNORMAL LOW (ref 8.9–10.3)
Chloride: 101 mmol/L (ref 98–111)
Creatinine, Ser: 1.42 mg/dL — ABNORMAL HIGH (ref 0.44–1.00)
GFR, Estimated: 43 mL/min — ABNORMAL LOW (ref >60)
Glucose, Bld: 249 mg/dL — ABNORMAL HIGH (ref 70–99)
Potassium: 3.8 mmol/L (ref 3.5–5.1)
Sodium: 132 mmol/L — ABNORMAL LOW (ref 135–145)
Total Bilirubin: 0.3 mg/dL (ref 0.3–1.2)
Total Protein: 7.5 g/dL (ref 6.5–8.1)

## 2020-11-03 LAB — RAPID URINE DRUG SCREEN, HOSP PERFORMED
Amphetamines: NOT DETECTED
Barbiturates: NOT DETECTED
Benzodiazepines: NOT DETECTED
Cocaine: NOT DETECTED
Opiates: NOT DETECTED
Tetrahydrocannabinol: NOT DETECTED

## 2020-11-03 LAB — CBC WITH DIFFERENTIAL/PLATELET
Abs Immature Granulocytes: 0.04 10*3/uL (ref 0.00–0.07)
Basophils Absolute: 0.1 10*3/uL (ref 0.0–0.1)
Basophils Relative: 1 %
Eosinophils Absolute: 0.4 10*3/uL (ref 0.0–0.5)
Eosinophils Relative: 4 %
HCT: 33.8 % — ABNORMAL LOW (ref 36.0–46.0)
Hemoglobin: 10.7 g/dL — ABNORMAL LOW (ref 12.0–15.0)
Immature Granulocytes: 0 %
Lymphocytes Relative: 32 %
Lymphs Abs: 3.6 10*3/uL (ref 0.7–4.0)
MCH: 29 pg (ref 26.0–34.0)
MCHC: 31.7 g/dL (ref 30.0–36.0)
MCV: 91.6 fL (ref 80.0–100.0)
Monocytes Absolute: 0.6 10*3/uL (ref 0.1–1.0)
Monocytes Relative: 5 %
Neutro Abs: 6.4 10*3/uL (ref 1.7–7.7)
Neutrophils Relative %: 58 %
Platelets: 277 10*3/uL (ref 150–400)
RBC: 3.69 MIL/uL — ABNORMAL LOW (ref 3.87–5.11)
RDW: 15.4 % (ref 11.5–15.5)
WBC: 11.1 10*3/uL — ABNORMAL HIGH (ref 4.0–10.5)
nRBC: 0 % (ref 0.0–0.2)

## 2020-11-03 LAB — D-DIMER, QUANTITATIVE: D-Dimer, Quant: 0.67 ug/mL-FEU — ABNORMAL HIGH (ref 0.00–0.50)

## 2020-11-03 LAB — TROPONIN I (HIGH SENSITIVITY)
Troponin I (High Sensitivity): <2 ng/L (ref <18)
Troponin I (High Sensitivity): <2 ng/L (ref <18)

## 2020-11-03 LAB — BRAIN NATRIURETIC PEPTIDE: B Natriuretic Peptide: 16 pg/mL (ref 0.0–100.0)

## 2020-11-03 LAB — ETHANOL: Alcohol, Ethyl (B): <10 mg/dL (ref <10)

## 2020-11-03 MED ORDER — SODIUM CHLORIDE 0.9 % IV SOLN
1.0000 g | Freq: Once | INTRAVENOUS | Status: AC
  Administered 2020-11-03: 1 g via INTRAVENOUS
  Filled 2020-11-03: qty 10

## 2020-11-03 MED ORDER — CEPHALEXIN 500 MG PO CAPS: 500.0000 mg | ORAL_CAPSULE | Freq: Three times a day (TID) | ORAL | 0 refills | Status: DC

## 2020-11-03 MED ORDER — IOHEXOL 350 MG/ML SOLN
100.0000 mL | Freq: Once | INTRAVENOUS | Status: AC | PRN
  Administered 2020-11-03: 100 mL via INTRAVENOUS

## 2020-11-03 NOTE — Discharge Instructions (Addendum)
Your testing is reassuring.  There is no evidence of heart attack or blood clot in the lung.  Take antibiotics for a urinary tract infection.  Call the number to schedule ultrasound of your leg to rule out blood clot.  As we discussed we cannot rule out seizure.  You should not drive or operate heavy machinery until cleared by the neurologist.  Do not swim or bathe alone. Return to the ED with new or worsening symptoms.

## 2020-11-03 NOTE — ED Provider Notes (Signed)
[facility] EMERGENCY DEPARTMENT Provider Note   CSN: [id number] Arrival date & time: 11/03/20  0137     History Chief Complaint  Patient presents with   Shortness of Breath    [name] is a 55 y.o. female.  Patient with a history of diabetes, schizoaffective schizophrenia, hypertension, Charcot foot on the left here with loss of consciousness and episode of unresponsiveness, now resolved.  States suddenly this evening she began to feel "very tired and fatigued".  She is in a wheelchair at baseline due to her Charcot foot and neuropathy.  States she slumped over in the wheelchair and was unable to respond or speak for approximately 2 minutes.  Her friend thought it was more like 10 minutes.  She was aware of what is going on but could not respond and could not move.  There was no tongue biting or incontinence.  No seizure activity.  Patient stated that she felt like she was short of breath and could not speak and could not move and could not respond.  On EMS arrival she was able to move appropriately.  CBG was over 200.  She feels improved at this point.  Still feels slightly short of breath.  No chest pain.  No abdominal pain, nausea or vomiting.  No reported hypoglycemia at home.  No reported seizure activity.  No history of seizures.  No drug or alcohol use. Denies any preceding dizziness or lightheadedness.  No focal weakness, numbness or tingling. Left lower extremity swelling is at baseline.  The history is provided by the patient and the EMS personnel.  Shortness of Breath Associated symptoms: no abdominal pain, no chest pain, no fever, no headaches and no vomiting       Past Medical History:  Diagnosis Date   Charcot's joint of foot, left    Depression    Diabetes mellitus without complication ([place])    Hyperlipidemia    Hypertension    Neuromuscular disorder ([place])    Schizo affective schizophrenia ([place])    abilify and pazil    Patient Active Problem List    Diagnosis Date Noted   GERD (gastroesophageal reflux disease) 10/21/2020   Lactose intolerance 08/03/2020   Charcot's joint of foot, left    Slurred speech 05/09/2020   Schizophrenia ([place]) 02/20/2020   Chronic diarrhea 11/25/2019   History of colon cancer 11/25/2019   Iron deficiency anemia 07/25/2019   S/P partial colectomy 07/17/2019   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[place]) 05/28/2019   Anemia    Neuropathic foot ulcer, left, with fat layer exposed ([place]) 02/05/2019   Morbid obesity ([place]) 05/10/2017   Long-term use of high-risk medication 05/10/2017   Hyperlipidemia associated with type 2 diabetes mellitus ([place]) 06/27/2016   Hammer toe of second toe of right foot 04/20/2016   Essential hypertension 06/29/2015   Schizoaffective disorder ([place]) 06/29/2015   DM (diabetes mellitus), type 2, 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[place]) 61/44/3154   Polyneuropathy 04/02/2014    Past Surgical History:  Procedure Laterality Date   CHOLECYSTECTOMY     COLONOSCOPY WITH PROPOFOL N/A 05/26/2019   Procedure: COLONOSCOPY WITH PROPOFOL;  Surgeon: Toledo, Benay Pike, MD;  Location: [facility] ENDOSCOPY;  Service: Gastroenterology;  Laterality: N/A;   ESOPHAGOGASTRODUODENOSCOPY (EGD) WITH PROPOFOL N/A 05/26/2019   Procedure: ESOPHAGOGASTRODUODENOSCOPY (EGD) WITH PROPOFOL;  Surgeon: Toledo, Benay Pike, MD;  Location: [facility] ENDOSCOPY;  Service: Gastroenterology;  Laterality: N/A;   IRRIGATION AND DEBRIDEMENT FOOT Right 02/26/2016   Procedure: RIGHT 2ND TOE DEBRIDEMENT;  Surgeon: Samara Deist, DPM;  Location: [facility] ORS;  Service: Podiatry;  Laterality: Right;   IRRIGATION AND DEBRIDEMENT FOOT Left 12/13/2018   Procedure: IRRIGATION AND DEBRIDEMENT FOOT;  Surgeon: Caroline More, DPM;  Location: [facility] ORS;  Service: Podiatry;  Laterality: Left;     OB History     Gravida  2   Para  2   Term  2   Preterm      AB      Living  2      SAB      IAB      Ectopic      Multiple      Live Births               Family History  Problem Relation Age of Onset   Breast cancer Cousin        maternal cousin   Hypertension Mother    Hyperthyroidism Mother    Dementia Mother    Prostate cancer Father    Diabetes Maternal Grandmother    Hypertension Maternal Grandmother    Cancer Maternal Grandmother        unknown type   Diabetes Maternal Grandfather    Hypertension Maternal Grandfather    Diabetes Paternal Grandmother    Hypertension Paternal Grandmother    Diabetes Paternal Grandfather    Hypertension Paternal Grandfather     Social History   Tobacco Use   Smoking status: Every Day    Packs/day: 1.00    Years: 30.00    Pack years: 30.00    Types: Cigarettes    Last attempt to quit: 05/02/2009    Years since quitting: 11.5   Smokeless tobacco: Never  Vaping Use   Vaping Use: Never used  Substance Use Topics   Alcohol use: No   Drug use: No    Home Medications Prior to Admission medications   Medication Sig Start Date End Date Taking? Authorizing Provider  acetaminophen (TYLENOL) 325 MG tablet Take 2 tablets (650 mg total) by mouth every 6 (six) hours as needed for mild pain. 06/04/19   Nicole Kindred A, DO  ARIPiprazole (ABILIFY) 15 MG tablet Take 1 tablet (15 mg total) by mouth daily. 10/06/20   Lindell Spar, MD  atorvastatin (LIPITOR) 40 MG tablet Take 1 tablet (40 mg total) by mouth daily. 09/24/20   Lindell Spar, MD  blood glucose meter kit and supplies Dispense based on patient and insurance preference. Use up to four times daily as directed. (FOR ICD-10 E10.9, E11.9). 09/24/20   Lindell Spar, MD  buPROPion (WELLBUTRIN XL) 300 MG 24 hr tablet TAKE 1 TABLET BY MOUTH EACH MORNING. 10/06/20   Lindell Spar, MD  colestipol (COLESTID) 1 g tablet TAKE TWO (2) TABLETS BY MOUTH TWICE DAILY 10/19/20   Lindell Spar, MD  furosemide (LASIX) 40 MG tablet Take 1 tablet (40 mg total) by mouth daily. 09/24/20   Lindell Spar, MD  gabapentin (NEURONTIN) 300 MG capsule Take  1 capsule (300 mg total) by mouth at bedtime. 09/24/20   Lindell Spar, MD  insulin glargine (LANTUS SOLOSTAR) 100 UNIT/ML Solostar Pen Inject 30 Units into the skin at bedtime. 10/21/20   Lindell Spar, MD  Lancets (ONETOUCH DELICA PLUS [id number]) MISC USE TO TEST 3 TIMES DAILY. 06/29/20   Lindell Spar, MD  losartan-hydrochlorothiazide (HYZAAR) 50-12.5 MG tablet Take 1 tablet by mouth daily. 10/21/20   Lindell Spar, MD  metFORMIN (GLUCOPHAGE) 1000 MG tablet Take 1 tablet (1,000 mg total)  by mouth daily with breakfast. 10/06/20   Lindell Spar, MD  metoprolol succinate (TOPROL-XL) 50 MG 24 hr tablet Take 1 tablet (50 mg total) by mouth daily. Take with or immediately following a meal. 09/24/20   Lindell Spar, MD  mupirocin ointment (BACTROBAN) 2 % APPLY TO AFFECTED AREA TWICE DAILY 08/28/20   McDonald, Stephan Minister, DPM  ondansetron (ZOFRAN) 4 MG tablet Take 1 tablet (4 mg total) by mouth every 6 (six) hours. Patient taking differently: Take 4 mg by mouth every 8 (eight) hours as needed for nausea or vomiting. 05/16/19   Wurst, [place], PA-C  ONETOUCH VERIO test strip CHECK BLOOD SUGAR 3 TIMES DAILY. 05/15/20   Karamalegos, Devonne Doughty, DO  pantoprazole (PROTONIX) 40 MG tablet Take 1 tablet (40 mg total) by mouth daily. 09/24/20   Lindell Spar, MD  polyethylene glycol-electrolytes (TRILYTE) 420 g solution Take 4,000 mLs by mouth as directed. 08/03/20   Harvel Quale, MD  traZODone (DESYREL) 50 MG tablet Take 1 tablet (50 mg total) by mouth at bedtime as needed. for sleep 10/06/20   Noreene Larsson, NP    Allergies    Patient has no known allergies.  Review of Systems   Review of Systems  Constitutional:  Negative for activity change, appetite change and fever.  HENT:  Positive for congestion.   Respiratory:  Positive for shortness of breath. Negative for chest tightness.   Cardiovascular:  Positive for leg swelling. Negative for chest pain.  Gastrointestinal:  Negative for abdominal  pain, nausea and vomiting.  Genitourinary:  Negative for dysuria and hematuria.  Musculoskeletal:  Negative for arthralgias and myalgias.  Neurological:  Positive for syncope and weakness. Negative for dizziness and headaches.   all other systems are negative except as noted in the HPI and PMH.   Physical Exam Updated Vital Signs BP 116/74   Pulse 80   Resp 18   Ht 5' 10" (1.778 m)   Wt 113 kg   SpO2 98%   BMI 35.74 kg/m   Physical Exam Vitals and nursing note reviewed.  Constitutional:      General: She is not in acute distress.    Appearance: She is well-developed. She is not ill-appearing.  HENT:     Head: Normocephalic and atraumatic.     Mouth/Throat:     Pharynx: No oropharyngeal exudate.  Eyes:     Conjunctiva/sclera: Conjunctivae normal.     Pupils: Pupils are equal, round, and reactive to light.  Neck:     Comments: No meningismus. Cardiovascular:     Rate and Rhythm: Normal rate and regular rhythm.     Heart sounds: Normal heart sounds. No murmur heard. Pulmonary:     Effort: Pulmonary effort is normal. No respiratory distress.     Breath sounds: Normal breath sounds. No wheezing.  Abdominal:     Palpations: Abdomen is soft.     Tenderness: There is no abdominal tenderness. There is no guarding or rebound.  Musculoskeletal:        General: Swelling present. No tenderness. Normal range of motion.     Cervical back: Normal range of motion and neck supple.     Comments: Asymmetric swelling of left leg baseline per patient  Skin:    General: Skin is warm.  Neurological:     Mental Status: She is alert and oriented to person, place, and time.     Cranial Nerves: No cranial nerve deficit.     Motor: No abnormal  muscle tone.     Coordination: Coordination normal.     Comments:  5/5 strength throughout. CN 2-12 intact.Equal grip strength.   Psychiatric:        Behavior: Behavior normal.    ED Results / Procedures / Treatments   Labs (all labs ordered are  listed, but only abnormal results are displayed) Labs Reviewed  URINALYSIS, ROUTINE W REFLEX MICROSCOPIC - Abnormal; Notable for the following components:      Result Value   APPearance HAZY (*)    Glucose, UA 50 (*)    Hgb urine dipstick SMALL (*)    Nitrite POSITIVE (*)    Leukocytes,Ua MODERATE (*)    WBC, UA >50 (*)    Bacteria, UA FEW (*)    All other components within normal limits  CBC WITH DIFFERENTIAL/PLATELET - Abnormal; Notable for the following components:   WBC 11.1 (*)    RBC 3.69 (*)    Hemoglobin 10.7 (*)    HCT 33.8 (*)    All other components within normal limits  COMPREHENSIVE METABOLIC PANEL - Abnormal; Notable for the following components:   Sodium 132 (*)    Glucose, Bld 249 (*)    BUN 28 (*)    Creatinine, Ser 1.42 (*)    Calcium 8.8 (*)    Alkaline Phosphatase 146 (*)    GFR, Estimated 43 (*)    Anion gap 2 (*)    All other components within normal limits  D-DIMER, QUANTITATIVE - Abnormal; Notable for the following components:   D-Dimer, Quant 0.67 (*)    All other components within normal limits  URINE CULTURE  RAPID URINE DRUG SCREEN, HOSP PERFORMED  BRAIN NATRIURETIC PEPTIDE  ETHANOL  TROPONIN I (HIGH SENSITIVITY)  TROPONIN I (HIGH SENSITIVITY)    EKG EKG Interpretation  Date/Time:  Tuesday November 03 2020 01:44:26 EDT Ventricular Rate:  79 PR Interval:  173 QRS Duration: 92 QT Interval:  380 QTC Calculation: 436 R Axis:   86 Text Interpretation: Sinus rhythm Probable anteroseptal infarct, old No significant change was found Confirmed by Ezequiel Essex [phone]) on 11/03/2020 1:58:41 AM  Radiology CT Head Wo Contrast  Result Date: 11/03/2020 CLINICAL DATA:  Syncope EXAM: CT HEAD WITHOUT CONTRAST TECHNIQUE: Contiguous axial images were obtained from the base of the skull through the vertex without intravenous contrast. COMPARISON:  None. FINDINGS: Brain: There is no mass, hemorrhage or extra-axial collection. The size and configuration of the  ventricles and extra-axial CSF spaces are normal. The brain parenchyma is normal, without acute or chronic infarction. Vascular: No abnormal hyperdensity of the major intracranial arteries or dural venous sinuses. No intracranial atherosclerosis. Skull: The visualized skull base, calvarium and extracranial soft tissues are normal. Sinuses/Orbits: No fluid levels or advanced mucosal thickening of the visualized paranasal sinuses. No mastoid or middle ear effusion. The orbits are normal. IMPRESSION: Normal head CT. Electronically Signed   By: Ulyses Jarred M.D.   On: 11/03/2020 03:03   CT Angio Chest PE W and/or Wo Contrast  Result Date: 11/03/2020 CLINICAL DATA:  Shortness of breath, elevated D-dimer, abnormal neuro exam EXAM: CT ANGIOGRAPHY CHEST WITH CONTRAST TECHNIQUE: Multidetector CT imaging of the chest was performed using the standard protocol during bolus administration of intravenous contrast. Multiplanar CT image reconstructions and MIPs were obtained to evaluate the vascular anatomy. CONTRAST:  154m OMNIPAQUE IOHEXOL 350 MG/ML SOLN COMPARISON:  Chest radiograph dated 11/03/2020. CTA chest dated 05/21/2019. FINDINGS: Cardiovascular: Satisfactory opacification of the bilateral pulmonary arteries to the segmental level.  No evidence of pulmonary embolism. Although not tailored for evaluation of the thoracic aorta, there is no evidence of thoracic aortic aneurysm or dissection. Mild atherosclerotic calcifications of the aortic arch. The heart is normal in size.  No pericardial effusion. Mild 3 vessel coronary atherosclerosis. Mediastinum/Nodes: No suspicious mediastinal lymphadenopathy. Visualized thyroid is unremarkable. Lungs/Pleura: Mild mosaic attenuation in the lungs bilaterally, lower lobe predominant, suggesting air trapping. No suspicious pulmonary nodules. No focal consolidation. No pleural effusion or pneumothorax. Upper Abdomen: Visualized upper abdomen is notable for a 2.0 cm left adrenal nodule  (series 4/image 94), chronic, presumed to reflect a benign adrenal adenoma. Prior cholecystectomy. Musculoskeletal: Degenerative changes of the visualized thoracolumbar spine. Review of the MIP images confirms the above findings. IMPRESSION: No evidence of pulmonary embolism. No evidence of acute cardiopulmonary disease. Aortic Atherosclerosis (ICD10-I70.0). Electronically Signed   By: Julian Hy M.D.   On: 11/03/2020 03:28   DG Chest Portable 1 View  Result Date: 11/03/2020 CLINICAL DATA:  Shortness of breath EXAM: PORTABLE CHEST 1 VIEW COMPARISON:  06/03/2019 FINDINGS: Lungs are clear.  No pleural effusion or pneumothorax. The heart is normal in size. IMPRESSION: No evidence of acute cardiopulmonary disease. Electronically Signed   By: Julian Hy M.D.   On: 11/03/2020 02:32    Procedures Procedures   Medications Ordered in ED Medications - No data to display  ED Course  I have reviewed the triage vital signs and the nursing notes.  Pertinent labs & imaging results that were available during my care of the patient were reviewed by me and considered in my medical decision making (see chart for details).    MDM Rules/Calculators/A&P                         Episode of decreased responsiveness for several minutes.  No fall or trauma.  Patient now awake.  States she was aware of not being able to respond.  No seizure-like activity.  No tongue biting or incontinence. No reported hypoglycemia.  EKG is sinus rhythm.  Nonfocal neurological exam currently. Labs reassuring with stable anemia and creatinine.  Low suspicion for ACS.  CT head is negative.  Will rule out pulmonary embolism and ACS UA concerning for UTI, culture sent. Tox screen negative.  Patient able to ambulate and tolerate p.o.  Troponin negative x2 with low suspicion for ACS with unchanged EKG.  EKG shows no Brugada or prolonged QT.  Syncope versus seizure which cannot be ruled out.  CT head is negative.  CT angio  negative for pulmonary embolism. No arrhythmias seen throughout ED course.  Advised to follow-up with PCP as well as neurology.  No driving or operating machinery until cleared by neurology. Return precautions discussed.  Will arrange outpatient DVT study of left leg but low suspicion for DVT. Final Clinical Impression(s) / ED Diagnoses Final diagnoses:  None    Rx / DC Orders ED Discharge Orders     None        , Annie Main, MD 11/03/20 [phone]

## 2020-11-03 NOTE — ED Triage Notes (Signed)
Rcems from home with cc of shortness of breath for 1 hour. Also c/o left leg swelling and pain . Has neuropathy.

## 2020-11-03 NOTE — ED Notes (Signed)
Attempted to contact pt daughter and son whos numbers are in the chart. No answer. Will try again later.

## 2020-11-04 ENCOUNTER — Other Ambulatory Visit: Payer: Self-pay | Admitting: *Deleted

## 2020-11-04 ENCOUNTER — Ambulatory Visit (INDEPENDENT_AMBULATORY_CARE_PROVIDER_SITE_OTHER): Payer: Medicare Other

## 2020-11-04 DIAGNOSIS — Z0000 Encounter for general adult medical examination without abnormal findings: Secondary | ICD-10-CM

## 2020-11-04 DIAGNOSIS — F251 Schizoaffective disorder, depressive type: Secondary | ICD-10-CM

## 2020-11-04 NOTE — Progress Notes (Signed)
Subjective:   [name] is a 55 y.o. female who presents for Medicare Annual (Subsequent) preventive examination.  I connected with [name] today by telephone and verified that I am speaking with the correct person using two identifiers. Location patient: home Location provider: work Persons participating in the virtual visit: patient, provider.   I discussed the limitations, risks, security and privacy concerns of performing an evaluation and management service by telephone and the availability of in person appointments. I also discussed with the patient that there may be a patient responsible charge related to this service. The patient expressed understanding and verbally consented to this telephonic visit.    Interactive audio and video telecommunications were attempted between this provider and patient, however failed, due to patient having technical difficulties OR patient did not have access to video capability.  We continued and completed visit with audio only.     Review of Systems    N/A  Cardiac Risk Factors include: hypertension;dyslipidemia;diabetes mellitus     Objective:    Today's Vitals   11/04/20 1502  PainSc: 0-No pain   There is no height or weight on file to calculate BMI.  Advanced Directives 11/04/2020 05/08/2020 03/03/2020 12/05/2019 11/27/2019 09/10/2019 07/24/2019  Does Patient Have a Medical Advance Directive? No No No - No No No  Does patient want to make changes to medical advance directive? - - - - - - Yes (ED - Information included in AVS)  Would patient like information on creating a medical advance directive? No - Patient declined No - Patient declined No - Patient declined No - Patient declined No - Patient declined No - Patient declined -  Some encounter information is confidential and restricted. Go to Review Flowsheets activity to see all data.    Current Medications (verified) Outpatient Encounter Medications as of 11/04/2020   Medication Sig   acetaminophen (TYLENOL) 325 MG tablet Take 2 tablets (650 mg total) by mouth every 6 (six) hours as needed for mild pain.   ARIPiprazole (ABILIFY) 15 MG tablet Take 1 tablet (15 mg total) by mouth daily.   atorvastatin (LIPITOR) 40 MG tablet Take 1 tablet (40 mg total) by mouth daily.   blood glucose meter kit and supplies Dispense based on patient and insurance preference. Use up to four times daily as directed. (FOR ICD-10 E10.9, E11.9).   buPROPion (WELLBUTRIN XL) 300 MG 24 hr tablet TAKE 1 TABLET BY MOUTH EVERY MORNING   cephALEXin (KEFLEX) 500 MG capsule Take 1 capsule (500 mg total) by mouth 3 (three) times daily.   colestipol (COLESTID) 1 g tablet TAKE TWO (2) TABLETS BY MOUTH TWICE DAILY   furosemide (LASIX) 40 MG tablet Take 1 tablet (40 mg total) by mouth daily.   gabapentin (NEURONTIN) 300 MG capsule Take 1 capsule (300 mg total) by mouth at bedtime.   insulin glargine (LANTUS SOLOSTAR) 100 UNIT/ML Solostar Pen Inject 30 Units into the skin at bedtime.   Lancets (ONETOUCH DELICA PLUS [id number]) MISC USE TO TEST 3 TIMES DAILY.   losartan-hydrochlorothiazide (HYZAAR) 50-12.5 MG tablet Take 1 tablet by mouth daily.   metFORMIN (GLUCOPHAGE) 1000 MG tablet Take 1 tablet (1,000 mg total) by mouth daily with breakfast.   metoprolol succinate (TOPROL-XL) 50 MG 24 hr tablet Take 1 tablet (50 mg total) by mouth daily. Take with or immediately following a meal.   mupirocin ointment (BACTROBAN) 2 % APPLY TO AFFECTED AREA TWICE DAILY   ondansetron (ZOFRAN) 4 MG tablet Take 1 tablet (4  mg total) by mouth every 6 (six) hours. (Patient taking differently: Take 4 mg by mouth every 8 (eight) hours as needed for nausea or vomiting.)   ONETOUCH VERIO test strip CHECK BLOOD SUGAR 3 TIMES DAILY.   pantoprazole (PROTONIX) 40 MG tablet Take 1 tablet (40 mg total) by mouth daily.   polyethylene glycol-electrolytes (TRILYTE) 420 g solution Take 4,000 mLs by mouth as directed. (Patient not  taking: Reported on 11/04/2020)   traZODone (DESYREL) 50 MG tablet Take 1 tablet (50 mg total) by mouth at bedtime as needed. for sleep (Patient not taking: Reported on 11/04/2020)   No facility-administered encounter medications on file as of 11/04/2020.    Allergies (verified) Patient has no known allergies.   History: Past Medical History:  Diagnosis Date   Charcot's joint of foot, left    Depression    Diabetes mellitus without complication ([place])    Hyperlipidemia    Hypertension    Neuromuscular disorder ([place])    Schizo affective schizophrenia ([place])    abilify and pazil   Past Surgical History:  Procedure Laterality Date   CHOLECYSTECTOMY     COLONOSCOPY WITH PROPOFOL N/A 05/26/2019   Procedure: COLONOSCOPY WITH PROPOFOL;  Surgeon: Toledo, Benay Pike, MD;  Location: [facility] END[facility]COPY;  Service: Gastroenterology;  Laterality: N/A;   ESOPHAGOGASTRODUODENOSCOPY (EGD) WITH PROPOFOL N/A 05/26/2019   Procedure: ESOPHAGOGASTRODUODENOSCOPY (EGD) WITH PROPOFOL;  Surgeon: Toledo, Benay Pike, MD;  Location: [facility] ENDOSCOPY;  Service: Gastroenterology;  Laterality: N/A;   IRRIGATION AND DEBRIDEMENT FOOT Right 02/26/2016   Procedure: RIGHT 2ND TOE DEBRIDEMENT;  Surgeon: Samara Deist, DPM;  Location: [facility] ORS;  Service: Podiatry;  Laterality: Right;   IRRIGATION AND DEBRIDEMENT FOOT Left 12/13/2018   Procedure: IRRIGATION AND DEBRIDEMENT FOOT;  Surgeon: Caroline More, DPM;  Location: [facility] ORS;  Service: Podiatry;  Laterality: Left;   Family History  Problem Relation Age of Onset   Breast cancer Cousin        maternal cousin   Hypertension Mother    Hyperthyroidism Mother    Dementia Mother    Prostate cancer Father    Diabetes Maternal Grandmother    Hypertension Maternal Grandmother    Cancer Maternal Grandmother        unknown type   Diabetes Maternal Grandfather    Hypertension Maternal Grandfather    Diabetes Paternal Grandmother    Hypertension Paternal Grandmother    Diabetes Paternal  Grandfather    Hypertension Paternal Grandfather    Social History   Socioeconomic History   Marital status: Divorced    Spouse name: Not on file   Number of children: 2   Years of education: Not on file   Highest education level: Not on file  Occupational History   Not on file  Tobacco Use   Smoking status: Every Day    Packs/day: 1.00    Years: 30.00    Pack years: 30.00    Types: Cigarettes    Last attempt to quit: 05/02/2009    Years since quitting: 11.5   Smokeless tobacco: Never  Vaping Use   Vaping Use: Never used  Substance and Sexual Activity   Alcohol use: No   Drug use: No   Sexual activity: Not Currently  Other Topics Concern   Not on file  Social History Narrative   At home with mom, 51. Lives in brothers house who moved out. Sister comes by every other day to check on patient and mom.   Social Determinants of Health   Financial  Resource Strain: Low Risk    Difficulty of Paying Living Expenses: Not hard at all  Food Insecurity: No Food Insecurity   Worried About Charity fundraiser in the Last Year: Never true   Ran Out of Food in the Last Year: Never true  Transportation Needs: No Transportation Needs   Lack of Transportation (Medical): No   Lack of Transportation (Non-Medical): No  Physical Activity: Inactive   Days of Exercise per Week: 0 days   Minutes of Exercise per Session: 0 min  Stress: No Stress Concern Present   Feeling of Stress : Only a little  Social Connections: Socially Isolated   Frequency of Communication with Friends and Family: More than three times a week   Frequency of Social Gatherings with Friends and Family: More than three times a week   Attends Religious Services: Never   Marine scientist or Organizations: No   Attends Music therapist: Never   Marital Status: Divorced    Tobacco Counseling Ready to quit: Not Answered Counseling given: Not Answered   Clinical Intake:  Pre-visit preparation  completed: Yes  Pain : No/denies pain Pain Score: 0-No pain     Nutritional Risks: Nausea/ vomitting/ diarrhea (Diarrhea) Diabetes: Yes CBG done?: No Did pt. bring in CBG monitor from home?: No  How often do you need to have someone help you when you read instructions, pamphlets, or other written materials from your doctor or pharmacy?: 3 - Sometimes (due to vision)  Diabetic?Yes Nutrition Risk Assessment:  Has the patient had any N/V/D within the last 2 months?  Yes  Does the patient have any non-healing wounds?  No  Has the patient had any unintentional weight loss or weight gain?  No   Diabetes:  Is the patient diabetic?  Yes  If diabetic, was a CBG obtained today?  No  Did the patient bring in their glucometer from home?  No  How often do you monitor your CBG's? Patient states checks glucose in the morning and before bed time .   Financial Strains and Diabetes Management:  Are you having any financial strains with the device, your supplies or your medication? No .  Does the patient want to be seen by Chronic Care Management for management of their diabetes?  No  Would the patient like to be referred to a Nutritionist or for Diabetic Management?  No   Diabetic Exams:  Diabetic Eye Exam: Overdue for diabetic eye exam. Pt has been advised about the importance in completing this exam. Patient advised to call and schedule an eye exam. Diabetic Foot Exam: Overdue, Pt has been advised about the importance in completing this exam. Pt is scheduled for diabetic foot exam on 01/21/2021.          Activities of Daily Living In your present state of health, do you have any difficulty performing the following activities: 11/04/2020 05/09/2020  Hearing? Y Y  Comment - -  Vision? N Y  Comment - -  Difficulty concentrating or making decisions? N Y  Comment - -  Walking or climbing stairs? Y Y  Dressing or bathing? N N  Doing errands, shopping? Y N  Preparing Food and eating ? N  -  Using the Toilet? N -  In the past six months, have you accidently leaked urine? N -  Do you have problems with loss of bowel control? Y -  Comment has loss of bowel control -  Managing your Medications? N -  Managing your Finances? N -  Housekeeping or managing your Housekeeping? Y -  Some encounter information is confidential and restricted. Go to Review Flowsheets activity to see all data.  Some recent data might be hidden    Patient Care Team: Lindell Spar, MD as PCP - General (Internal Medicine) Floria Raveling, MD as Referring Physician (Psychiatry) Edrick Kins, MD as Rounding Team (Internal Medicine) Lloyd Huger, MD as Consulting Physician (Oncology) Ethelda Chick as Social Worker [place], Ranae Pila, Jay as Social Worker Kassie Mends, RN as [place] Management Oletta Lamas, Garwin Brothers, RN as Oncology Nurse Navigator (Oncology)  Indicate any recent Medical Services you may have received from other than [facility] providers in the past year (date may be approximate).     Assessment:   This is a routine wellness examination for [place].  Hearing/Vision screen Vision Screening - Comments:: Patient gets eye exams once per year. Currently wears over the counter reading glasses   Dietary issues and exercise activities discussed: Current Exercise Habits: The patient does not participate in regular exercise at present, Exercise limited by: neurologic condition(s)   Goals Addressed             This Visit's Progress    HEMOGLOBIN A1C < 7         Depression Screen PHQ 2/9 Scores 11/04/2020 10/21/2020 09/24/2020 06/24/2020 04/17/2020 02/05/2019 10/26/2018  PHQ - 2 Score 0 0 0 0 4 0 0  PHQ- 9 Score - - - - '23 3 2    ' Fall Risk Fall Risk  11/04/2020 10/21/2020 09/24/2020 06/24/2020 05/26/2020  Falls in the past year? 0 0 0 0 0  Comment - - - - -  Number falls in past yr: 0 0 0 0 0  Injury with Fall? 0 0 0 0 0  Risk for fall due to : No Fall Risks  No Fall Risks No Fall Risks No Fall Risks -  Risk for fall due to: Comment - - - - -  Follow up Falls evaluation completed;Falls prevention discussed Falls evaluation completed Falls evaluation completed Falls evaluation completed -    FALL RISK PREVENTION PERTAINING TO THE HOME:  Any stairs in or around the home? Yes  If so, are there any without handrails? No  Home free of loose throw rugs in walkways, pet beds, electrical cords, etc? Yes  Adequate lighting in your home to reduce risk of falls? Yes   ASSISTIVE DEVICES UTILIZED TO PREVENT FALLS:  Life alert? No  Use of a cane, walker or w/c? Yes  Grab bars in the bathroom? Yes  Shower chair or bench in shower? Yes  Elevated toilet seat or a handicapped toilet? No     Cognitive Function:  Normal cognitive status assessed by direct observation by this Nurse Health Advisor. No abnormalities found.     6CIT Screen 10/24/2017  What Year? 0 points  What month? 0 points  What time? 0 points  Count back from 20 0 points  Months in reverse 0 points  Repeat phrase 0 points  Total Score 0    Immunizations Immunization History  Administered Date(s) Administered   Influenza,inj,Quad PF,6+ Mos 01/18/2017, 01/03/2018, 02/05/2019, 05/10/2020   Pneumococcal Conjugate-13 10/21/2020   Pneumococcal Polysaccharide-23 10/24/2017   Zoster Recombinat (Shingrix) 10/21/2020    TDAP status: Due, Education has been provided regarding the importance of this vaccine. Advised may receive this vaccine at local pharmacy or Health Dept. Aware to provide a copy of the vaccination  record if obtained from local pharmacy or Health Dept. Verbalized acceptance and understanding.  Flu Vaccine status: Up to date  Pneumococcal vaccine status: Up to date  Covid-19 vaccine status: Information provided on how to obtain vaccines.   Qualifies for Shingles Vaccine? Yes   Zostavax completed No   Shingrix Completed?: Yes  Screening Tests Health Maintenance   Topic Date Due   COVID-19 Vaccine (1) Never done   Pneumococcal Vaccine 27-16 Years old (1 - PCV) Never done   OPHTHALMOLOGY EXAM  Never done   MAMMOGRAM  08/17/2017   FOOT EXAM  03/17/2020   PAP SMEAR-Modifier  01/03/2021   INFLUENZA VACCINE  11/30/2020   Zoster Vaccines- Shingrix (2 of 2) 12/16/2020   HEMOGLOBIN A1C  04/22/2021   TETANUS/TDAP  09/16/2025   COLONOSCOPY (Pts 45-38yr Insurance coverage will need to be confirmed)  05/25/2029   PNEUMOCOCCAL POLYSACCHARIDE VACCINE AGE 53-64 HIGH RISK  Completed   Hepatitis C Screening  Completed   HIV Screening  Completed   HPV VACCINES  Aged Out    Health Maintenance  Health Maintenance Due  Topic Date Due   COVID-19 Vaccine (1) Never done   Pneumococcal Vaccine 050611Years old (1 - PCV) Never done   OPHTHALMOLOGY EXAM  Never done   MAMMOGRAM  08/17/2017   FOOT EXAM  03/17/2020   PAP SMEAR-Modifier  01/03/2021    Colorectal cancer screening: Type of screening: Colonoscopy. Completed 09/17/2015. Repeat every 10 years  Mammogram status: Ordered 06/24/2020. Pt provided with contact info and advised to call to schedule appt.   Bone Density Status: Not due until age 57  Lung Cancer Screening: (Low Dose CT Chest recommended if Age 57-80years, 30 pack-year currently smoking OR have quit w/in 15years.) does not qualify.   Lung Cancer Screening Referral: N/A   Additional Screening:  Hepatitis C Screening: does qualify; Completed 11/07/2017  Vision Screening: Recommended annual ophthalmology exams for early detection of glaucoma and other disorders of the eye. Is the patient up to date with their annual eye exam?  Yes  Who is the provider or what is the name of the office in which the patient attends annual eye exams? [facility] [place] If pt is not established with a provider, would they like to be referred to a provider to establish care? No .   Dental Screening: Recommended annual dental exams for proper oral  hygiene  Community Resource Referral / Chronic Care Management: CRR required this visit?  No   CCM required this visit?  No      Plan:     I have personally reviewed and noted the following in the patient's chart:   Medical and social history Use of alcohol, tobacco or illicit drugs  Current medications and supplements including opioid prescriptions.  Functional ability and status Nutritional status Physical activity Advanced directives List of other physicians Hospitalizations, surgeries, and ER visits in previous 12 months Vitals Screenings to include cognitive, depression, and falls Referrals and appointments  In addition, I have reviewed and discussed with patient certain preventive protocols, quality metrics, and best practice recommendations. A written personalized care plan for preventive services as well as general preventive health recommendations were provided to patient.     SOfilia Neas LPN   [id number]  Nurse Notes: None

## 2020-11-04 NOTE — Patient Instructions (Addendum)
[name] , Thank you for taking time to come for your Medicare Wellness Visit. I appreciate your ongoing commitment to your health goals. Please review the following plan we discussed and let me know if I can assist you in the future.   Screening recommendations/referrals: Colonoscopy: Currently due, please call [facility] to get rescheduled  Mammogram: Currently due, please call [facility]  Bone Density: Not due until age 57  Recommended yearly ophthalmology/optometry visit for glaucoma screening and checkup Recommended yearly dental visit for hygiene and checkup  Vaccinations: Influenza vaccine: Up to date, next due fall 2022  Pneumococcal vaccine: Up to date, next due age 43  Tdap vaccine: Up to date, next due 09/16/2025 Shingles vaccine: Up to date, next due 12/16/2020    Advanced directives: Advance directive discussed with you today. Even though you declined this today please call our office should you change your mind and we can give you the proper paperwork for you to fill out.   Conditions/risks identified: None   Next appointment: 01/21/2021 @ 1:40 PM with Dr. Posey Pronto    Preventive Care 65 Years and Older, Female Preventive care refers to lifestyle choices and visits with your health care provider that can promote health and wellness. What does preventive care include? A yearly physical exam. This is also called an annual well check. Dental exams once or twice a year. Routine eye exams. Ask your health care provider how often you should have your eyes checked. Personal lifestyle choices, including: Daily care of your teeth and gums. Regular physical activity. Eating a healthy diet. Avoiding tobacco and drug use. Limiting alcohol use. Practicing safe sex. Taking low-dose aspirin every day. Taking vitamin and mineral supplements as recommended by your health care provider. What happens during an annual well check? The services and  screenings done by your health care provider during your annual well check will depend on your age, overall health, lifestyle risk factors, and family history of disease. Counseling  Your health care provider may ask you questions about your: Alcohol use. Tobacco use. Drug use. Emotional well-being. Home and relationship well-being. Sexual activity. Eating habits. History of falls. Memory and ability to understand (cognition). Work and work Statistician. Reproductive health. Screening  You may have the following tests or measurements: Height, weight, and BMI. Blood pressure. Lipid and cholesterol levels. These may be checked every 5 years, or more frequently if you are over 66 years old. Skin check. Lung cancer screening. You may have this screening every year starting at age 40 if you have a 30-pack-year history of smoking and currently smoke or have quit within the past 15 years. Fecal occult blood test (FOBT) of the stool. You may have this test every year starting at age 7. Flexible sigmoidoscopy or colonoscopy. You may have a sigmoidoscopy every 5 years or a colonoscopy every 10 years starting at age 60. Hepatitis C blood test. Hepatitis B blood test. Sexually transmitted disease (STD) testing. Diabetes screening. This is done by checking your blood sugar (glucose) after you have not eaten for a while (fasting). You may have this done every 1-3 years. Bone density scan. This is done to screen for osteoporosis. You may have this done starting at age 21. Mammogram. This may be done every 1-2 years. Talk to your health care provider about how often you should have regular mammograms. Talk with your health care provider about your test results, treatment options, and if necessary, the need for more tests. Vaccines  Your  health care provider may recommend certain vaccines, such as: Influenza vaccine. This is recommended every year. Tetanus, diphtheria, and acellular pertussis (Tdap,  Td) vaccine. You may need a Td booster every 10 years. Zoster vaccine. You may need this after age 37. Pneumococcal 13-valent conjugate (PCV13) vaccine. One dose is recommended after age 66. Pneumococcal polysaccharide (PPSV23) vaccine. One dose is recommended after age 29. Talk to your health care provider about which screenings and vaccines you need and how often you need them. This information is not intended to replace advice given to you by your health care provider. Make sure you discuss any questions you have with your health care provider. Document Released: 05/15/2015 Document Revised: 01/06/2016 Document Reviewed: 02/17/2015 Elsevier Interactive Patient Education  2017 [employer] Prevention in the Home Falls can cause injuries. They can happen to people of all ages. There are many things you can do to make your home safe and to help prevent falls. What can I do on the outside of my home? Regularly fix the edges of walkways and driveways and fix any cracks. Remove anything that might make you trip as you walk through a door, such as a raised step or threshold. Trim any bushes or trees on the path to your home. Use bright outdoor lighting. Clear any walking paths of anything that might make someone trip, such as rocks or tools. Regularly check to see if handrails are loose or broken. Make sure that both sides of any steps have handrails. Any raised decks and porches should have guardrails on the edges. Have any leaves, snow, or ice cleared regularly. Use sand or salt on walking paths during winter. Clean up any spills in your garage right away. This includes oil or grease spills. What can I do in the bathroom? Use night lights. Install grab bars by the toilet and in the tub and shower. Do not use towel bars as grab bars. Use non-skid mats or decals in the tub or shower. If you need to sit down in the shower, use a plastic, non-slip stool. Keep the floor dry. Clean up any  water that spills on the floor as soon as it happens. Remove soap buildup in the tub or shower regularly. Attach bath mats securely with double-sided non-slip rug tape. Do not have throw rugs and other things on the floor that can make you trip. What can I do in the bedroom? Use night lights. Make sure that you have a light by your bed that is easy to reach. Do not use any sheets or blankets that are too big for your bed. They should not hang down onto the floor. Have a firm chair that has side arms. You can use this for support while you get dressed. Do not have throw rugs and other things on the floor that can make you trip. What can I do in the kitchen? Clean up any spills right away. Avoid walking on wet floors. Keep items that you use a lot in easy-to-reach places. If you need to reach something above you, use a strong step stool that has a grab bar. Keep electrical cords out of the way. Do not use floor polish or wax that makes floors slippery. If you must use wax, use non-skid floor wax. Do not have throw rugs and other things on the floor that can make you trip. What can I do with my stairs? Do not leave any items on the stairs. Make sure that there are handrails  on both sides of the stairs and use them. Fix handrails that are broken or loose. Make sure that handrails are as long as the stairways. Check any carpeting to make sure that it is firmly attached to the stairs. Fix any carpet that is loose or worn. Avoid having throw rugs at the top or bottom of the stairs. If you do have throw rugs, attach them to the floor with carpet tape. Make sure that you have a light switch at the top of the stairs and the bottom of the stairs. If you do not have them, ask someone to add them for you. What else can I do to help prevent falls? Wear shoes that: Do not have high heels. Have rubber bottoms. Are comfortable and fit you well. Are closed at the toe. Do not wear sandals. If you use a  stepladder: Make sure that it is fully opened. Do not climb a closed stepladder. Make sure that both sides of the stepladder are locked into place. Ask someone to hold it for you, if possible. Clearly mark and make sure that you can see: Any grab bars or handrails. First and last steps. Where the edge of each step is. Use tools that help you move around (mobility aids) if they are needed. These include: Canes. Walkers. Scooters. Crutches. Turn on the lights when you go into a dark area. Replace any light bulbs as soon as they burn out. Set up your furniture so you have a clear path. Avoid moving your furniture around. If any of your floors are uneven, fix them. If there are any pets around you, be aware of where they are. Review your medicines with your doctor. Some medicines can make you feel dizzy. This can increase your chance of falling. Ask your doctor what other things that you can do to help prevent falls. This information is not intended to replace advice given to you by your health care provider. Make sure you discuss any questions you have with your health care provider. Document Released: 02/12/2009 Document Revised: 09/24/2015 Document Reviewed: 05/23/2014 Elsevier Interactive Patient Education  2017 [employer].

## 2020-11-06 LAB — URINE CULTURE: Culture: >=100000 — AB

## 2020-11-19 ENCOUNTER — Ambulatory Visit (INDEPENDENT_AMBULATORY_CARE_PROVIDER_SITE_OTHER): Payer: Medicare Other | Admitting: *Deleted

## 2020-11-19 ENCOUNTER — Other Ambulatory Visit: Payer: Self-pay | Admitting: Internal Medicine

## 2020-11-19 DIAGNOSIS — F251 Schizoaffective disorder, depressive type: Secondary | ICD-10-CM

## 2020-11-19 DIAGNOSIS — K219 Gastro-esophageal reflux disease without esophagitis: Secondary | ICD-10-CM

## 2020-11-19 DIAGNOSIS — I10 Essential (primary) hypertension: Secondary | ICD-10-CM | POA: Diagnosis not present

## 2020-11-19 DIAGNOSIS — IMO0002 Reserved for concepts with insufficient information to code with codable children: Secondary | ICD-10-CM

## 2020-11-19 DIAGNOSIS — E1149 Type 2 diabetes mellitus with other diabetic neurological complication: Secondary | ICD-10-CM

## 2020-11-22 NOTE — Patient Instructions (Signed)
Visit Information  PATIENT GOALS:  Goals Addressed   None     The patient verbalized understanding of instructions, educational materials, and care plan provided today and declined offer to receive copy of patient instructions, educational materials, and care plan.   Telephone follow up appointment with care management team member scheduled for:11/26/20 Eduard Clos MSW, LCSW Licensed Clinical Social Education officer, environmental Primary Care [phone]

## 2020-11-22 NOTE — Chronic Care Management (AMB) (Signed)
Chronic Care Management    Clinical Social Work Note  11/22/2020 Name: [name] MRN: [id number] DOB: 08/10/1965  [name] is a 55 y.o. year old female who is a primary care patient of Lindell Spar, MD. The CCM team was consulted to assist the patient with chronic disease management and/or care coordination needs related to: Mental Health Counseling and Resources.   Engaged with patient by telephone for follow up visit in response to provider referral for social work chronic care management and care coordination services.   Consent to Services:  The patient was given information about Chronic Care Management services, agreed to services, and gave verbal consent prior to initiation of services.  Please see initial visit note for detailed documentation.   Patient agreed to services and consent obtained.   Assessment: Review of patient past medical history, allergies, medications, and health status, including review of relevant consultants reports was performed today as part of a comprehensive evaluation and provision of chronic care management and care coordination services.     SDOH (Social Determinants of Health) assessments and interventions performed:    Advanced Directives Status: Not addressed in this encounter.  CCM Care Plan  No Known Allergies  Outpatient Encounter Medications as of 11/19/2020  Medication Sig   acetaminophen (TYLENOL) 325 MG tablet Take 2 tablets (650 mg total) by mouth every 6 (six) hours as needed for mild pain.   ARIPiprazole (ABILIFY) 15 MG tablet TAKE 1 TABLET BY MOUTH DAILY   atorvastatin (LIPITOR) 40 MG tablet Take 1 tablet (40 mg total) by mouth daily.   blood glucose meter kit and supplies Dispense based on patient and insurance preference. Use up to four times daily as directed. (FOR ICD-10 E10.9, E11.9).   buPROPion (WELLBUTRIN XL) 300 MG 24 hr tablet TAKE 1 TABLET BY MOUTH EVERY MORNING   cephALEXin (KEFLEX) 500 MG capsule  Take 1 capsule (500 mg total) by mouth 3 (three) times daily.   colestipol (COLESTID) 1 g tablet TAKE TWO (2) TABLETS BY MOUTH TWICE DAILY   furosemide (LASIX) 40 MG tablet Take 1 tablet (40 mg total) by mouth daily.   gabapentin (NEURONTIN) 300 MG capsule Take 1 capsule (300 mg total) by mouth at bedtime.   Lancets (ONETOUCH DELICA PLUS [id number]) MISC USE TO TEST 3 TIMES DAILY.   LANTUS SOLOSTAR 100 UNIT/ML Solostar Pen INJECT 25 UNITS INTO THE SKIN AT BEDTIME.   losartan-hydrochlorothiazide (HYZAAR) 50-12.5 MG tablet Take 1 tablet by mouth daily.   metFORMIN (GLUCOPHAGE) 1000 MG tablet Take 1 tablet (1,000 mg total) by mouth daily with breakfast.   metoprolol succinate (TOPROL-XL) 50 MG 24 hr tablet Take 1 tablet (50 mg total) by mouth daily. Take with or immediately following a meal.   mupirocin ointment (BACTROBAN) 2 % APPLY TO AFFECTED AREA TWICE DAILY   ondansetron (ZOFRAN) 4 MG tablet Take 1 tablet (4 mg total) by mouth every 6 (six) hours. (Patient taking differently: Take 4 mg by mouth every 8 (eight) hours as needed for nausea or vomiting.)   ONETOUCH VERIO test strip CHECK BLOOD SUGAR 3 TIMES DAILY.   pantoprazole (PROTONIX) 40 MG tablet Take 1 tablet (40 mg total) by mouth daily.   polyethylene glycol-electrolytes (TRILYTE) 420 g solution Take 4,000 mLs by mouth as directed. (Patient not taking: Reported on 11/04/2020)   traZODone (DESYREL) 50 MG tablet Take 1 tablet (50 mg total) by mouth at bedtime as needed. for sleep (Patient not taking: Reported on 11/04/2020)   No  facility-administered encounter medications on file as of 11/19/2020.    Patient Active Problem List   Diagnosis Date Noted   GERD (gastroesophageal reflux disease) 10/21/2020   Lactose intolerance 08/03/2020   Charcot's joint of foot, left    Slurred speech 05/09/2020   Schizophrenia ([place]) 02/20/2020   Chronic diarrhea 11/25/2019   History of colon cancer 11/25/2019   Iron deficiency anemia 07/25/2019   S/P  partial colectomy 07/17/2019   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[place]) 05/28/2019   Anemia    Neuropathic foot ulcer, left, with fat layer exposed ([place]) 02/05/2019   Morbid obesity ([place]) 05/10/2017   Long-term use of high-risk medication 05/10/2017   Hyperlipidemia associated with type 2 diabetes mellitus ([place]) 06/27/2016   Hammer toe of second toe of right foot 04/20/2016   Essential hypertension 06/29/2015   Schizoaffective disorder ([place]) 06/29/2015   DM (diabetes mellitus), type 2, 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[place]) 26/33/3545   Polyneuropathy 04/02/2014    Conditions to be addressed/monitored: Anxiety and Depression; Mental Health Concerns   Care Plan : LCSW Plan of Care  Updates made by Deirdre Peer, LCSW since 11/22/2020 12:00 AM     Problem: Coping Skills (General Plan of Care)      Long-Range Goal: Coping Skills Enhanced   Start Date: 06/25/2020  Expected End Date: 01/29/2021  This Visit's Progress: On track  Recent Progress: Not on track  Priority: High  Note:   Current Barriers:  Chronic Mental Health needs related to Schizoaffective d/o, depression Mental Health Concerns , Limited access to caregiver, and Cognitive Deficits Suicidal Ideation/Homicidal Ideation: No  Clinical Social Work Goal(s):  patient will work with SW PRN by telephone or in person to reduce or manage symptoms related to mental health needs Over the next 30 days, patient will work with SW to address concerns related to managing mental health needs, organizing appointments and setting goals for further independence.  Interventions: Pt reports she is currently dealing with a crisis due to her mother being taken to the hospital right now for possible heart attack. "My siblings and I are talking about what to do..... if she needs a stint or something".  CSW offered pt support and encouraged pt to focus on remaining calm in the midst of the emotional time. CSW offered to call pt next week to  further engage and discuss her goals/ mental health care.   Patient interviewed and appropriate assessments performed: brief mental health assessment Patient interviewed and appropriate assessments performed Discussed plans with patient for ongoing care management follow up and provided patient with direct contact information for care management team Advised patient to schedule ongoing counseling appointments Motivational Interviewing, Solution-Focused Strategies, and Emotional/Supportive Counseling  Patient Self Care Activities:  Self administers medications as prescribed, Attends all scheduled provider appointments, Independent living, and Motivation for treatment  Patient Coping Strengths:  Family Hopefulness  Patient Self Care Deficits:  Lacks social connections Does not maintain contact with provider office  Patient Goals:  - return to [facility]  -seek and schedule appointment with new counselor- visit [facility] to see if they accept your insurance - start a calendar notebook to organize all appointments - start or continue a personal journal - talk about feelings with a friend, family or spiritual advisor - practice positive thinking and self-talk   Follow Up Plan: Appointment scheduled for SW follow up with client by phone on 11/26/2020 , Client will call and schedule virtual counseling appointments, and utilize calendar to be mailed to  her for organizing all appointments and notes/info related to her health and mental health.          Follow Up Plan: Appointment scheduled for SW follow up with client by phone on: 11/26/20      Eduard Clos MSW, LCSW Licensed Clinical Social Education officer, environmental Primary Care [phone]

## 2020-11-24 ENCOUNTER — Ambulatory Visit: Payer: Medicare Other | Admitting: Nurse Practitioner

## 2020-11-26 ENCOUNTER — Telehealth: Payer: Medicare Other

## 2020-11-26 ENCOUNTER — Telehealth: Payer: Self-pay | Admitting: *Deleted

## 2020-11-26 NOTE — Telephone Encounter (Signed)
  Care Management   Follow Up Note   11/26/2020 Name: [name] MRN: [id number] DOB: 1965/09/16   Referred by: Lindell Spar, MD Reason for referral : Chronic Care Management (HTN, DM2)   An unsuccessful telephone outreach was attempted today. The patient was referred to the case management team for assistance with care management and care coordination.   Follow Up Plan: Telephone follow up appointment with care management team member scheduled for:  upon care guide rescheduling.  Jacqlyn Larsen [facility], BSN RN Case Manager [facility] [phone]

## 2020-11-27 ENCOUNTER — Telehealth: Payer: Self-pay | Admitting: *Deleted

## 2020-11-27 NOTE — Telephone Encounter (Signed)
  Care Management   Follow Up Note   11/27/2020 Name: [name] MRN: [id number] DOB: 06/27/1965   Referred by: Lindell Spar, MD Reason for referral : No chief complaint on file.   An unsuccessful telephone outreach was attempted today. The patient was referred to the case management team for assistance with care management and care coordination.   Follow Up Plan: Telephone follow up appointment with care management team member scheduled for: 2-3 weeks  Eduard Clos MSW, LCSW Licensed Clinical Psychiatrist Primary Care [phone]

## 2020-11-30 ENCOUNTER — Encounter: Payer: Self-pay | Admitting: Oncology

## 2020-12-03 ENCOUNTER — Telehealth: Payer: Self-pay | Admitting: *Deleted

## 2020-12-03 NOTE — Chronic Care Management (AMB) (Signed)
  Care Management   Note  12/03/2020 Name: [name] MRN: [id number] DOB: [birth year]  [name] is a 55 y.o. year old female who is a primary care patient of Lindell Spar, MD and is actively engaged with the care management team. I reached out to [name] by phone today to assist with re-scheduling a follow up visit with the RN Case Manager  Follow up plan: Unsuccessful telephone outreach attempt made. A HIPAA compliant phone message was left for the patient providing contact information and requesting a return call.  The care management team will reach out to the patient again over the next 7-14 days.  If patient returns call to provider office, please advise to call [facility]  at [phone].  [place] Management  Direct Dial: [phone]

## 2020-12-04 ENCOUNTER — Encounter: Payer: Self-pay | Admitting: Oncology

## 2020-12-04 ENCOUNTER — Ambulatory Visit (INDEPENDENT_AMBULATORY_CARE_PROVIDER_SITE_OTHER): Payer: Medicare HMO | Admitting: Nurse Practitioner

## 2020-12-04 ENCOUNTER — Other Ambulatory Visit: Payer: Self-pay

## 2020-12-04 ENCOUNTER — Encounter: Payer: Self-pay | Admitting: Nurse Practitioner

## 2020-12-04 VITALS — BP 123/74 | HR 102 | Temp 97.6°F | Ht 70.0 in | Wt 245.0 lb

## 2020-12-04 DIAGNOSIS — F2089 Other schizophrenia: Secondary | ICD-10-CM | POA: Diagnosis not present

## 2020-12-04 DIAGNOSIS — R6889 Other general symptoms and signs: Secondary | ICD-10-CM | POA: Diagnosis not present

## 2020-12-04 DIAGNOSIS — R4189 Other symptoms and signs involving cognitive functions and awareness: Secondary | ICD-10-CM

## 2020-12-04 DIAGNOSIS — N390 Urinary tract infection, site not specified: Secondary | ICD-10-CM

## 2020-12-04 MED ORDER — SULFAMETHOXAZOLE-TRIMETHOPRIM 800-160 MG PO TABS: 1.0000 | ORAL_TABLET | Freq: Two times a day (BID) | ORAL | 0 refills | Status: DC

## 2020-12-04 NOTE — Assessment & Plan Note (Addendum)
-  was treated with keflex in ED 11/03/20, but states she still has some burning -U/A ordered today; positive for nitrites and leuks -Rx. Bactrim; previous culture grew E. Coli that was bactrim sensitive; unsure if she took keflex as prescribed -culture ordered

## 2020-12-04 NOTE — Progress Notes (Signed)
Acute Office Visit  Subjective:    Patient ID: [name], female    DOB: 25-Oct-1965, 55 y.o.   MRN: [id number]  Chief Complaint  Patient presents with   Follow-up    Went for UTI, does feel a lot better.    HPI Patient is in today for ED follow-up. Reviewed note from ED visit from 11/03/20. She was seen for episode on unresponsiveness for several minutes. Neuro work-up at the time was unremarkable.   Of note, her urine was positive for nitrites and leuks. Suspected UTI, and she was treated with keflex. No urinary issues reported today.  Past Medical History:  Diagnosis Date   Charcot's joint of foot, left    Depression    Diabetes mellitus without complication ([place])    Hyperlipidemia    Hypertension    Neuromuscular disorder ([place])    Schizo affective schizophrenia ([place])    abilify and pazil    Past Surgical History:  Procedure Laterality Date   CHOLECYSTECTOMY     COLONOSCOPY WITH PROPOFOL N/A 05/26/2019   Procedure: COLONOSCOPY WITH PROPOFOL;  Surgeon: Toledo, Benay Pike, MD;  Location: [facility];  Service: Gastroenterology;  Laterality: N/A;   ESOPHAGOGASTRODUODENOSCOPY (EGD) WITH PROPOFOL N/A 05/26/2019   Procedure: ESOPHAGOGASTRODUODENOSCOPY (EGD) WITH PROPOFOL;  Surgeon: Toledo, Benay Pike, MD;  Location: [facility] END[facility]COPY;  Service: Gastroenterology;  Laterality: N/A;   IRRIGATION AND DEBRIDEMENT FOOT Right 02/26/2016   Procedure: RIGHT 2ND TOE DEBRIDEMENT;  Surgeon: Samara Deist, DPM;  Location: [facility] ORS;  Service: Podiatry;  Laterality: Right;   IRRIGATION AND DEBRIDEMENT FOOT Left 12/13/2018   Procedure: IRRIGATION AND DEBRIDEMENT FOOT;  Surgeon: Caroline More, DPM;  Location: [facility] ORS;  Service: Podiatry;  Laterality: Left;    Family History  Problem Relation Age of Onset   Breast cancer Cousin        maternal cousin   Hypertension Mother    Hyperthyroidism Mother    Dementia Mother    Prostate cancer Father    Diabetes Maternal Grandmother     Hypertension Maternal Grandmother    Cancer Maternal Grandmother        unknown type   Diabetes Maternal Grandfather    Hypertension Maternal Grandfather    Diabetes Paternal Grandmother    Hypertension Paternal Grandmother    Diabetes Paternal Grandfather    Hypertension Paternal Grandfather     Social History   Socioeconomic History   Marital status: Divorced    Spouse name: Not on file   Number of children: 2   Years of education: Not on file   Highest education level: Not on file  Occupational History   Not on file  Tobacco Use   Smoking status: Every Day    Packs/day: 1.00    Years: 30.00    Pack years: 30.00    Types: Cigarettes    Last attempt to quit: 05/02/2009    Years since quitting: 11.6   Smokeless tobacco: Never  Vaping Use   Vaping Use: Never used  Substance and Sexual Activity   Alcohol use: No   Drug use: No   Sexual activity: Not Currently  Other Topics Concern   Not on file  Social History Narrative   At home with mom, 44. Lives in brothers house who moved out. Sister comes by every other day to check on patient and mom.   Social Determinants of Health   Financial Resource Strain: Low Risk    Difficulty of Paying Living Expenses: Not hard at all  Food Insecurity: No Food Insecurity   Worried About Charity fundraiser in the Last Year: Never true   Ran Out of Food in the Last Year: Never true  Transportation Needs: No Transportation Needs   Lack of Transportation (Medical): No   Lack of Transportation (Non-Medical): No  Physical Activity: Inactive   Days of Exercise per Week: 0 days   Minutes of Exercise per Session: 0 min  Stress: No Stress Concern Present   Feeling of Stress : Only a little  Social Connections: Socially Isolated   Frequency of Communication with Friends and Family: More than three times a week   Frequency of Social Gatherings with Friends and Family: More than three times a week   Attends Religious Services: Never   Building surveyor or Organizations: No   Attends Music therapist: Never   Marital Status: Divorced  Human resources officer Violence: Not At Risk   Fear of Current or Ex-Partner: No   Emotionally Abused: No   Physically Abused: No   Sexually Abused: No    Outpatient Medications Prior to Visit  Medication Sig Dispense Refill   acetaminophen (TYLENOL) 325 MG tablet Take 2 tablets (650 mg total) by mouth every 6 (six) hours as needed for mild pain.     ARIPiprazole (ABILIFY) 15 MG tablet TAKE 1 TABLET BY MOUTH DAILY 30 tablet 10   atorvastatin (LIPITOR) 40 MG tablet Take 1 tablet (40 mg total) by mouth daily. 90 tablet 1   blood glucose meter kit and supplies Dispense based on patient and insurance preference. Use up to four times daily as directed. (FOR ICD-10 E10.9, E11.9). 1 each 0   buPROPion (WELLBUTRIN XL) 300 MG 24 hr tablet TAKE 1 TABLET BY MOUTH EVERY MORNING 30 tablet 10   colestipol (COLESTID) 1 g tablet TAKE TWO (2) TABLETS BY MOUTH TWICE DAILY 120 tablet 10   furosemide (LASIX) 40 MG tablet Take 1 tablet (40 mg total) by mouth daily. 30 tablet 2   gabapentin (NEURONTIN) 300 MG capsule Take 1 capsule (300 mg total) by mouth at bedtime. 90 capsule 1   Lancets (ONETOUCH DELICA PLUS [id number]) MISC USE TO TEST 3 TIMES DAILY. 100 each 0   LANTUS SOLOSTAR 100 UNIT/ML Solostar Pen INJECT 25 UNITS INTO THE SKIN AT BEDTIME. 15 mL 10   losartan-hydrochlorothiazide (HYZAAR) 50-12.5 MG tablet Take 1 tablet by mouth daily. 90 tablet 0   metFORMIN (GLUCOPHAGE) 1000 MG tablet Take 1 tablet (1,000 mg total) by mouth daily with breakfast. 90 tablet 1   metoprolol succinate (TOPROL-XL) 50 MG 24 hr tablet Take 1 tablet (50 mg total) by mouth daily. Take with or immediately following a meal. 90 tablet 1   mupirocin ointment (BACTROBAN) 2 % APPLY TO AFFECTED AREA TWICE DAILY 22 g 10   ondansetron (ZOFRAN) 4 MG tablet Take 1 tablet (4 mg total) by mouth every 6 (six) hours. (Patient taking  differently: Take 4 mg by mouth every 8 (eight) hours as needed for nausea or vomiting.) 12 tablet 0   ONETOUCH VERIO test strip CHECK BLOOD SUGAR 3 TIMES DAILY. 100 strip 0   pantoprazole (PROTONIX) 40 MG tablet Take 1 tablet (40 mg total) by mouth daily. 90 tablet 1   polyethylene glycol-electrolytes (TRILYTE) 420 g solution Take 4,000 mLs by mouth as directed. 4000 mL 0   traZODone (DESYREL) 50 MG tablet Take 1 tablet (50 mg total) by mouth at bedtime as needed. for sleep 30 tablet 3  cephALEXin (KEFLEX) 500 MG capsule Take 1 capsule (500 mg total) by mouth 3 (three) times daily. (Patient not taking: Reported on 12/04/2020) 21 capsule 0   No facility-administered medications prior to visit.    No Known Allergies  Review of Systems  Constitutional: Negative.   Respiratory: Negative.    Cardiovascular: Negative.   Genitourinary:  Positive for dysuria. Negative for flank pain, frequency and urgency.       Dysuria is somewhat better from her hospitalization, but she states she still has some burning.      Objective:    Physical Exam Constitutional:      Appearance: Normal appearance.  Cardiovascular:     Rate and Rhythm: Normal rate and regular rhythm.     Pulses: Normal pulses.     Heart sounds: Normal heart sounds.     Comments: No carotid bruit Pulmonary:     Effort: Pulmonary effort is normal.     Breath sounds: Normal breath sounds.  Neurological:     General: No focal deficit present.     Mental Status: She is alert and oriented to person, place, and time.    BP 123/74 (BP Location: Right Arm, Patient Position: Sitting, Cuff Size: Large)   Pulse (!) 102   Temp 97.6 F (36.4 C) (Temporal)   Ht '5\' 10"'  (1.778 m)   Wt 245 lb (111.1 kg)   SpO2 97%   BMI 35.15 kg/m  Wt Readings from Last 3 Encounters:  12/04/20 245 lb (111.1 kg)  11/03/20 249 lb 1.9 oz (113 kg)  10/21/20 249 lb 1.9 oz (113 kg)    Health Maintenance Due  Topic Date Due   OPHTHALMOLOGY EXAM  Never  done   MAMMOGRAM  08/17/2017   FOOT EXAM  03/17/2020   INFLUENZA VACCINE  11/30/2020   PAP SMEAR-Modifier  01/03/2021    There are no preventive care reminders to display for this patient.   Lab Results  Component Value Date   TSH 2.00 11/07/2017   Lab Results  Component Value Date   WBC 11.1 (H) 11/03/2020   HGB 10.7 (L) 11/03/2020   HCT 33.8 (L) 11/03/2020   MCV 91.6 11/03/2020   PLT 277 11/03/2020   Lab Results  Component Value Date   NA 132 (L) 11/03/2020   K 3.8 11/03/2020   CO2 29 11/03/2020   GLUCOSE 249 (H) 11/03/2020   BUN 28 (H) 11/03/2020   CREATININE 1.42 (H) 11/03/2020   BILITOT 0.3 11/03/2020   ALKPHOS 146 (H) 11/03/2020   AST 16 11/03/2020   ALT 19 11/03/2020   PROT 7.5 11/03/2020   ALBUMIN 3.8 11/03/2020   CALCIUM 8.8 (L) 11/03/2020   ANIONGAP 2 (L) 11/03/2020   Lab Results  Component Value Date   CHOL 175 02/23/2020   Lab Results  Component Value Date   HDL 42 02/23/2020   Lab Results  Component Value Date   LDLCALC 78 02/23/2020   Lab Results  Component Value Date   TRIG 275 (H) 02/23/2020   Lab Results  Component Value Date   CHOLHDL 4.2 02/23/2020   Lab Results  Component Value Date   HGBA1C 9.4 (A) 10/21/2020       Assessment & Plan:   Problem List Items Addressed This Visit       Genitourinary   UTI (urinary tract infection) - Primary    -was treated with keflex in ED 11/03/20, but states she still has some burning -U/A ordered today  Relevant Orders   Urine Culture     Other   Unresponsive episode    -no issues since she was seen in ED -no focal deficits and neuro w/u was negative in ED, so no neuro consult ordered (given UTI was present)         No orders of the defined types were placed in this encounter.    Noreene Larsson, NP

## 2020-12-04 NOTE — Assessment & Plan Note (Signed)
-  no issues since she was seen in ED -no focal deficits and neuro w/u was negative in ED, so no neuro consult ordered (given UTI was present)

## 2020-12-08 NOTE — Progress Notes (Signed)
We sent in bactrim for her. No need to call results until the final report comes out.

## 2020-12-10 ENCOUNTER — Ambulatory Visit: Payer: Medicare HMO

## 2020-12-12 LAB — URINE CULTURE

## 2020-12-14 NOTE — Progress Notes (Signed)
The bactrim that we sent in was appropriate for treatment of the Klebsiella that grew in her urine. Is she feeling better?

## 2020-12-15 NOTE — Chronic Care Management (AMB) (Signed)
  Care Management   Note  12/15/2020 Name: [name] MRN: [id number] DOB: May 04, 1965  [name] is a 55 y.o. year old female who is a primary care patient of Lindell Spar, MD and is actively engaged with the care management team. I reached out to [name] by phone today to assist with re-scheduling a follow up visit with the RN Case Manager  Follow up plan: Telephone appointment with care management team member scheduled for:12/29/20  [place] [facility]  Direct Dial: [phone]

## 2020-12-16 ENCOUNTER — Telehealth: Payer: Medicare Other

## 2020-12-16 ENCOUNTER — Telehealth: Payer: Self-pay | Admitting: *Deleted

## 2020-12-16 ENCOUNTER — Other Ambulatory Visit (INDEPENDENT_AMBULATORY_CARE_PROVIDER_SITE_OTHER): Payer: Self-pay

## 2020-12-16 DIAGNOSIS — E1142 Type 2 diabetes mellitus with diabetic polyneuropathy: Secondary | ICD-10-CM

## 2020-12-16 NOTE — Telephone Encounter (Signed)
  Care Management   Follow Up Note   12/16/2020 Name: [name] MRN: [id number] DOB: 01/14/1966   Referred by: Lindell Spar, MD Reason for referral : No chief complaint on file.   An unsuccessful telephone outreach was attempted today. The patient was referred to the case management team for assistance with care management and care coordination.   Follow Up Plan: Telephone follow up appointment with care management team member scheduled for:01/08/21  Eduard Clos MSW, LCSW Licensed Clinical Social Education officer, [facility] [phone]

## 2020-12-17 MED ORDER — FUROSEMIDE 40 MG PO TABS: 40.0000 mg | ORAL_TABLET | Freq: Every day | ORAL | 2 refills | Status: DC

## 2020-12-24 ENCOUNTER — Ambulatory Visit: Payer: Medicare HMO

## 2020-12-24 ENCOUNTER — Telehealth (INDEPENDENT_AMBULATORY_CARE_PROVIDER_SITE_OTHER): Payer: Medicare HMO | Admitting: Licensed Clinical Social Worker

## 2020-12-24 ENCOUNTER — Ambulatory Visit: Payer: Medicare HMO | Admitting: Licensed Clinical Social Worker

## 2020-12-24 ENCOUNTER — Other Ambulatory Visit: Payer: Self-pay

## 2020-12-24 DIAGNOSIS — R6889 Other general symptoms and signs: Secondary | ICD-10-CM | POA: Diagnosis not present

## 2020-12-24 DIAGNOSIS — F2089 Other schizophrenia: Secondary | ICD-10-CM

## 2020-12-24 NOTE — BH Specialist Note (Signed)
[facility] Initial Clinical Assessment  MRN: [id number] NAME: [name] Date: 12/24/20  Start time: 125p  End time:   Total time:   Call number:  [phone]  Type of Contact:  phone Patient consent obtained:  yes Reason for Visit today:  initiate [facility] service  Treatment History Patient recently received Inpatient Treatment:  no  Facility/Program:    Date of discharge:   Patient currently being seen by therapist/psychiatrist:   Patient currently receiving the following services:    Past Psychiatric History/Hospitalization(s): Anxiety: No Bipolar Disorder: No Depression: No Mania: No Psychosis: No Schizophrenia: Yes Personality Disorder: No Hospitalization for psychiatric illness: No History of Electroconvulsive Shock Therapy: No Prior Suicide Attempts: No  Clinical Assessment:  PHQ-9 Assessments: Depression screen [facility] 2/9 12/04/2020 11/04/2020 10/21/2020  Decreased Interest 0 0 0  Down, Depressed, Hopeless 1 0 0  PHQ - 2 Score 1 0 0  Altered sleeping - - -  Tired, decreased energy - - -  Change in appetite - - -  Feeling bad or failure about yourself  - - -  Trouble concentrating - - -  Moving slowly or fidgety/restless - - -  Suicidal thoughts - - -  PHQ-9 Score - - -  Difficult doing work/chores - - -  Some recent data might be hidden    GAD-7 Assessments: GAD 7 : Generalized Anxiety Score 04/17/2020 01/03/2018  Nervous, Anxious, on Edge 2 0  Control/stop worrying 3 0  Worry too much - different things 3 0  Trouble relaxing 2 0  Restless 0 0  Easily annoyed or irritable 2 0  Afraid - awful might happen 0 0  Total GAD 7 Score 12 0  Anxiety Difficulty Somewhat difficult Not difficult at all     Social Functioning Social maturity:   Social judgement:    Stress Current stressors:   Familial stressors:   Sleep:   Appetite:   Coping ability:   Patient taking medications as prescribed:    Current medications:  Outpatient Encounter  Medications as of 12/24/2020  Medication Sig   acetaminophen (TYLENOL) 325 MG tablet Take 2 tablets (650 mg total) by mouth every 6 (six) hours as needed for mild pain.   ARIPiprazole (ABILIFY) 15 MG tablet TAKE 1 TABLET BY MOUTH DAILY   atorvastatin (LIPITOR) 40 MG tablet Take 1 tablet (40 mg total) by mouth daily.   blood glucose meter kit and supplies Dispense based on patient and insurance preference. Use up to four times daily as directed. (FOR ICD-10 E10.9, E11.9).   buPROPion (WELLBUTRIN XL) 300 MG 24 hr tablet TAKE 1 TABLET BY MOUTH EVERY MORNING   colestipol (COLESTID) 1 g tablet TAKE TWO (2) TABLETS BY MOUTH TWICE DAILY   furosemide (LASIX) 40 MG tablet Take 1 tablet (40 mg total) by mouth daily.   gabapentin (NEURONTIN) 300 MG capsule Take 1 capsule (300 mg total) by mouth at bedtime.   Lancets (ONETOUCH DELICA PLUS [id number]) MISC USE TO TEST 3 TIMES DAILY.   LANTUS SOLOSTAR 100 UNIT/ML Solostar Pen INJECT 25 UNITS INTO THE SKIN AT BEDTIME.   losartan-hydrochlorothiazide (HYZAAR) 50-12.5 MG tablet Take 1 tablet by mouth daily.   metFORMIN (GLUCOPHAGE) 1000 MG tablet Take 1 tablet (1,000 mg total) by mouth daily with breakfast.   metoprolol succinate (TOPROL-XL) 50 MG 24 hr tablet Take 1 tablet (50 mg total) by mouth daily. Take with or immediately following a meal.   mupirocin ointment (BACTROBAN) 2 % APPLY TO AFFECTED AREA  TWICE DAILY   ondansetron (ZOFRAN) 4 MG tablet Take 1 tablet (4 mg total) by mouth every 6 (six) hours. (Patient taking differently: Take 4 mg by mouth every 8 (eight) hours as needed for nausea or vomiting.)   ONETOUCH VERIO test strip CHECK BLOOD SUGAR 3 TIMES DAILY.   pantoprazole (PROTONIX) 40 MG tablet Take 1 tablet (40 mg total) by mouth daily.   polyethylene glycol-electrolytes (TRILYTE) 420 g solution Take 4,000 mLs by mouth as directed.   sulfamethoxazole-trimethoprim (BACTRIM DS) 800-160 MG tablet Take 1 tablet by mouth 2 (two) times daily.   traZODone  (DESYREL) 50 MG tablet Take 1 tablet (50 mg total) by mouth at bedtime as needed. for sleep   No facility-administered encounter medications on file as of 12/24/2020.    Self-harm Behaviors Risk Assessment Self-harm risk factors:   Patient endorses recent thoughts of harming self:    [place] Suicide Severity Rating Scale:  C-SRSS 12/05/2019 March 08, 2020  1. Wish to be Dead No No  2. Suicidal Thoughts No -  6. Suicide Behavior Question No No    Danger to Others Risk Assessment Danger to others risk factors:   Patient endorses recent thoughts of harming others:    Dynamic Appraisal of Situational Aggression (DASA):  CHL DYNAMIC APPRAISAL OF SITUATIONAL AGGRESSION (DASA) 02/22/2020 02/22/2020 02/23/2020 02/23/2020 02/24/2020 02/25/2020 02/26/2020  Irritability 0 0 0 0 0 0 0  Impulsivity 0 0 0 0 0 0 0  Unwillingness to Follow Directions 0 0 0 0 0 0 0  Sensitivity to Perceived Provocation 0 0 0 0 0 0 0  Easily Angered When Requests are Denied 0 0 0 0 0 0 0  Negative Attitudes 0 0 0 0 0 0 0  Verbal Threats 0 0 0 0 0 0 0  Total DASA Score 0 0 0 0 0 0 0  Final Risk Rating Low Risk Low Risk Low Risk Low Risk Low Risk Low Risk Low Risk  Physical Aggression against OBJECTS No No No No No No No  Verbal Aggression against OTHER PEOPLE No No No No No No No  Physical Aggression against OTHER PEOPLE No No No No No No No    Substance Use Assessment Patient recently consumed alcohol:    Alcohol Use Disorder Identification Test (AUDIT):  Alcohol Use Disorder Test (AUDIT) March 08, 2020 11/04/2020  1. How often do you have a drink containing alcohol? 0 0  2. How many drinks containing alcohol do you have on a typical day when you are drinking? 0 0  3. How often do you have six or more drinks on one occasion? 0 0  AUDIT-C Score 0 0  4. How often during the last year have you found that you were not able to stop drinking once you had started? 0 -  5. How often during the last year have you failed to do  what was normally expected from you because of drinking? 0 -  6. How often during the last year have you needed a first drink in the morning to get yourself going after a heavy drinking session? 0 -  7. How often during the last year have you had a feeling of guilt of remorse after drinking? 0 -  8. How often during the last year have you been unable to remember what happened the night before because you had been drinking? 0 -  9. Have you or someone else been injured as a result of your drinking? 0 -  10. Has a  relative or friend or a doctor or another health worker been concerned about your drinking or suggested you cut down? 0 -  Alcohol Use Disorder Identification Test Final Score (AUDIT) 0 -  Alcohol Brief Interventions/Follow-up AUDIT Score <7 follow-up not indicated -   Patient recently used drugs:    Opioid Risk Assessment:  Patient is concerned about dependence or abuse of substances:    ASAM Multidimensional Assessment Summary:  Dimension 1:    Dimension 1 Rating:    Dimension 2:    Dimension 2 Rating:    Dimension 3:    Dimension 3 Rating:    Dimension 4:    Dimension 4 Rating:    Dimension 5:    Dimension 5 Rating:    Dimension 6:    Dimension 6 Rating:   ASAM's Severity Rating Score:   ASAM Recommended Level of Treatment:     Goals, Interventions and Follow-up Plan Goals: Increase healthy adjustment to current life circumstances Interventions: Solution-Focused Strategies Follow-up Plan:  biweekly sessions  Summary of Clinical Assessment Summary: [name] is a 55 yr old woman referred by Dr. Posey Pronto.  She is requesting therapy to assist her with being assertive in her relationships.  Currenlty she is talking to someone that lives long distance and she gives him money often.  She did not want to disclose how much money she has given him.    Lubertha South, LCSW

## 2020-12-29 ENCOUNTER — Telehealth: Payer: Medicare HMO

## 2020-12-29 ENCOUNTER — Telehealth: Payer: Self-pay | Admitting: *Deleted

## 2020-12-29 NOTE — Telephone Encounter (Signed)
  Care Management   Follow Up Note   12/29/2020 Name: [name] MRN: [id number] DOB: 09/30/1965   Referred by: Lindell Spar, MD Reason for referral : Chronic Care Management (HTN, DM2)   A second unsuccessful telephone outreach was attempted today. The patient was referred to the case management team for assistance with care management and care coordination.   Follow Up Plan: Telephone follow up appointment with care management team member scheduled for:  upon care guide rescheduling.  Jacqlyn Larsen [facility], BSN RN Case Manager [facility] [phone]

## 2020-12-31 ENCOUNTER — Encounter: Payer: Self-pay | Admitting: Oncology

## 2021-01-01 ENCOUNTER — Telehealth: Payer: Medicare HMO

## 2021-01-07 ENCOUNTER — Telehealth: Payer: Medicare HMO

## 2021-01-15 ENCOUNTER — Telehealth: Payer: Self-pay | Admitting: *Deleted

## 2021-01-15 NOTE — Chronic Care Management (AMB) (Signed)
  Care Management   Note  01/15/2021 Name: [name] MRN: [id number] DOB: 07-03-1965  [name] is a 55 y.o. year old female who is a primary care patient of Lindell Spar, MD and is actively engaged with the care management team. I reached out to [name] by phone today to assist with re-scheduling a follow up visit with the RN Case Manager  Follow up plan: Unsuccessful telephone outreach attempt made. A HIPAA compliant phone message was left for the patient providing contact information and requesting a return call.  The care management team will reach out to the patient again over the next 7 days.  If patient returns call to provider office, please advise to call [place] at [facility]  Direct Dial: [phone]

## 2021-01-21 ENCOUNTER — Ambulatory Visit: Payer: Medicare Other | Admitting: Internal Medicine

## 2021-01-22 NOTE — Chronic Care Management (AMB) (Signed)
  Care Management   Note  01/22/2021 Name: [name] MRN: [id number] DOB: 1966-02-21  [name] is a 55 y.o. year old female who is a primary care patient of Lindell Spar, MD and is actively engaged with the care management team. I reached out to [name] by phone today to assist with re-scheduling a follow up visit with the RN Case Manager  Follow up plan: Unsuccessful telephone outreach attempt made. The care management team will reach out to the patient again over the next 7 days. If patient returns call to provider office, please advise to call [facility] at [phone].  [place] Management  Direct Dial: [phone]

## 2021-01-29 NOTE — Chronic Care Management (AMB) (Signed)
  Care Management   Note  01/29/2021 Name: [name] MRN: [id number] DOB: 28-Mar-1966  [name] is a 55 y.o. year old female who is a primary care patient of Lindell Spar, MD and is actively engaged with the care management team. I reached out to [name] by phone today to assist with re-scheduling a follow up visit with the RN Case Manager  Follow up plan: A third unsuccessful telephone outreach attempt made. A HIPAA compliant phone message was left for the patient providing contact information and requesting a return call. We have been unable to make contact with the patient for follow up. The care management team is available to follow up with the patient after provider conversation with the patient regarding recommendation for care management engagement and subsequent re-referral to the care management team.  [facility]  Direct Dial: [phone]

## 2021-02-02 ENCOUNTER — Ambulatory Visit: Payer: Self-pay | Admitting: *Deleted

## 2021-02-02 DIAGNOSIS — I10 Essential (primary) hypertension: Secondary | ICD-10-CM

## 2021-02-02 DIAGNOSIS — E1142 Type 2 diabetes mellitus with diabetic polyneuropathy: Secondary | ICD-10-CM

## 2021-02-02 NOTE — Chronic Care Management (AMB) (Signed)
   02/02/2021  [place] 1966-03-17 [id number]   Unable to maintain contact with patient.  Case closed.  Jacqlyn Larsen [facility], BSN RN Case Manager [facility] [phone]

## 2021-02-05 ENCOUNTER — Telehealth: Payer: Medicaid Other

## 2021-02-09 ENCOUNTER — Telehealth: Payer: Self-pay | Admitting: *Deleted

## 2021-02-09 NOTE — Telephone Encounter (Signed)
  Care Management   Follow Up Note   02/09/2021 Name: [name] MRN: [id number] DOB: 03/18/1966   Referred by: Lindell Spar, MD Reason for referral : No chief complaint on file.   Third unsuccessful telephone outreach was attempted today. The patient was referred to the case management team for assistance with care management and care coordination. The patient's primary care provider has been notified of our unsuccessful attempts to make or maintain contact with the patient. The care management team is pleased to engage with this patient at any time in the future should he/she be interested in assistance from the care management team.   Follow Up Plan: We have been unable to make contact with the patient for follow up. The care management team is available to follow up with the patient after provider conversation with the patient regarding recommendation for care management engagement and subsequent re-referral to the care management team.   Eduard Clos MSW, LCSW Licensed Clinical Social Worker [facility] [phone]

## 2021-02-18 ENCOUNTER — Telehealth: Payer: Self-pay | Admitting: Internal Medicine

## 2021-02-18 ENCOUNTER — Other Ambulatory Visit: Payer: Self-pay | Admitting: *Deleted

## 2021-02-18 DIAGNOSIS — I10 Essential (primary) hypertension: Secondary | ICD-10-CM

## 2021-02-18 DIAGNOSIS — E1169 Type 2 diabetes mellitus with other specified complication: Secondary | ICD-10-CM

## 2021-02-18 DIAGNOSIS — E1142 Type 2 diabetes mellitus with diabetic polyneuropathy: Secondary | ICD-10-CM

## 2021-02-18 DIAGNOSIS — F251 Schizoaffective disorder, depressive type: Secondary | ICD-10-CM

## 2021-02-18 DIAGNOSIS — E785 Hyperlipidemia, unspecified: Secondary | ICD-10-CM

## 2021-02-18 MED ORDER — LOSARTAN POTASSIUM-HCTZ 50-12.5 MG PO TABS: 1.0000 | ORAL_TABLET | Freq: Every day | ORAL | 0 refills | Status: DC

## 2021-02-18 MED ORDER — COLESTIPOL HCL 1 G PO TABS: ORAL_TABLET | ORAL | 10 refills | Status: DC

## 2021-02-18 MED ORDER — METOPROLOL SUCCINATE ER 50 MG PO TB24: 50.0000 mg | ORAL_TABLET | Freq: Every day | ORAL | 1 refills | Status: DC

## 2021-02-18 MED ORDER — BUPROPION HCL ER (XL) 300 MG PO TB24: ORAL_TABLET | ORAL | 10 refills | Status: DC

## 2021-02-18 MED ORDER — ARIPIPRAZOLE 15 MG PO TABS: 15.0000 mg | ORAL_TABLET | Freq: Every day | ORAL | 10 refills | Status: DC

## 2021-02-18 MED ORDER — ATORVASTATIN CALCIUM 40 MG PO TABS: 40.0000 mg | ORAL_TABLET | Freq: Every day | ORAL | 1 refills | Status: DC

## 2021-02-18 MED ORDER — FUROSEMIDE 40 MG PO TABS: 40.0000 mg | ORAL_TABLET | Freq: Every day | ORAL | 2 refills | Status: DC

## 2021-02-18 MED ORDER — GABAPENTIN 300 MG PO CAPS: 300.0000 mg | ORAL_CAPSULE | Freq: Every day | ORAL | 1 refills | Status: DC

## 2021-02-18 MED ORDER — TRAZODONE HCL 50 MG PO TABS: 50.0000 mg | ORAL_TABLET | Freq: Every evening | ORAL | 3 refills | Status: DC | PRN

## 2021-02-18 MED ORDER — PANTOPRAZOLE SODIUM 40 MG PO TBEC: 40.0000 mg | DELAYED_RELEASE_TABLET | Freq: Every day | ORAL | 1 refills | Status: DC

## 2021-02-18 MED ORDER — METFORMIN HCL 1000 MG PO TABS: 1000.0000 mg | ORAL_TABLET | Freq: Every day | ORAL | 1 refills | Status: DC

## 2021-02-18 NOTE — Telephone Encounter (Signed)
Please send all medication to [facility] they are going to bubble wrap the medication

## 2021-02-18 NOTE — Telephone Encounter (Signed)
Pt medication sent to [facility]

## 2021-02-25 ENCOUNTER — Ambulatory Visit: Payer: Medicaid Other

## 2021-03-02 ENCOUNTER — Ambulatory Visit: Payer: Medicaid Other | Admitting: Internal Medicine

## 2021-03-12 ENCOUNTER — Telehealth: Payer: Medicaid Other

## 2021-03-25 ENCOUNTER — Other Ambulatory Visit: Payer: Self-pay

## 2021-03-25 ENCOUNTER — Emergency Department (HOSPITAL_COMMUNITY)
Admission: EM | Admit: 2021-03-25 | Discharge: 2021-03-25 | Disposition: A | Discharge status: Home / Self Care | Payer: Medicare Other | Attending: Emergency Medicine | Admitting: Emergency Medicine

## 2021-03-25 ENCOUNTER — Emergency Department (HOSPITAL_COMMUNITY): Payer: Medicare Other

## 2021-03-25 ENCOUNTER — Encounter (HOSPITAL_COMMUNITY): Payer: Self-pay

## 2021-03-25 DIAGNOSIS — N390 Urinary tract infection, site not specified: Secondary | ICD-10-CM | POA: Insufficient documentation

## 2021-03-25 DIAGNOSIS — Z743 Need for continuous supervision: Secondary | ICD-10-CM | POA: Diagnosis not present

## 2021-03-25 DIAGNOSIS — R739 Hyperglycemia, unspecified: Secondary | ICD-10-CM | POA: Diagnosis not present

## 2021-03-25 DIAGNOSIS — Z7984 Long term (current) use of oral hypoglycemic drugs: Secondary | ICD-10-CM | POA: Insufficient documentation

## 2021-03-25 DIAGNOSIS — Z20822 Contact with and (suspected) exposure to covid-19: Secondary | ICD-10-CM | POA: Insufficient documentation

## 2021-03-25 DIAGNOSIS — R000 Tachycardia, unspecified: Secondary | ICD-10-CM | POA: Diagnosis not present

## 2021-03-25 DIAGNOSIS — R531 Weakness: Secondary | ICD-10-CM | POA: Diagnosis not present

## 2021-03-25 DIAGNOSIS — R519 Headache, unspecified: Secondary | ICD-10-CM | POA: Diagnosis not present

## 2021-03-25 DIAGNOSIS — I10 Essential (primary) hypertension: Secondary | ICD-10-CM | POA: Insufficient documentation

## 2021-03-25 DIAGNOSIS — R262 Difficulty in walking, not elsewhere classified: Secondary | ICD-10-CM | POA: Diagnosis not present

## 2021-03-25 DIAGNOSIS — Z85038 Personal history of other malignant neoplasm of large intestine: Secondary | ICD-10-CM | POA: Diagnosis not present

## 2021-03-25 DIAGNOSIS — E119 Type 2 diabetes mellitus without complications: Secondary | ICD-10-CM | POA: Insufficient documentation

## 2021-03-25 DIAGNOSIS — B9689 Other specified bacterial agents as the cause of diseases classified elsewhere: Secondary | ICD-10-CM | POA: Diagnosis not present

## 2021-03-25 DIAGNOSIS — Z79899 Other long term (current) drug therapy: Secondary | ICD-10-CM | POA: Diagnosis not present

## 2021-03-25 DIAGNOSIS — E1149 Type 2 diabetes mellitus with other diabetic neurological complication: Secondary | ICD-10-CM | POA: Diagnosis not present

## 2021-03-25 DIAGNOSIS — R6889 Other general symptoms and signs: Secondary | ICD-10-CM | POA: Diagnosis not present

## 2021-03-25 DIAGNOSIS — F17210 Nicotine dependence, cigarettes, uncomplicated: Secondary | ICD-10-CM | POA: Diagnosis not present

## 2021-03-25 DIAGNOSIS — R0902 Hypoxemia: Secondary | ICD-10-CM | POA: Diagnosis not present

## 2021-03-25 LAB — CBC WITH DIFFERENTIAL/PLATELET
Abs Immature Granulocytes: 0.08 10*3/uL — ABNORMAL HIGH (ref 0.00–0.07)
Basophils Absolute: 0.1 10*3/uL (ref 0.0–0.1)
Basophils Relative: 1 %
Eosinophils Absolute: 0.1 10*3/uL (ref 0.0–0.5)
Eosinophils Relative: 0 %
HCT: 32.8 % — ABNORMAL LOW (ref 36.0–46.0)
Hemoglobin: 10.5 g/dL — ABNORMAL LOW (ref 12.0–15.0)
Immature Granulocytes: 1 %
Lymphocytes Relative: 16 %
Lymphs Abs: 2 10*3/uL (ref 0.7–4.0)
MCH: 29.6 pg (ref 26.0–34.0)
MCHC: 32 g/dL (ref 30.0–36.0)
MCV: 92.4 fL (ref 80.0–100.0)
Monocytes Absolute: 1.1 10*3/uL — ABNORMAL HIGH (ref 0.1–1.0)
Monocytes Relative: 9 %
Neutro Abs: 9.3 10*3/uL — ABNORMAL HIGH (ref 1.7–7.7)
Neutrophils Relative %: 73 %
Platelets: 203 10*3/uL (ref 150–400)
RBC: 3.55 MIL/uL — ABNORMAL LOW (ref 3.87–5.11)
RDW: 15.3 % (ref 11.5–15.5)
WBC: 12.5 10*3/uL — ABNORMAL HIGH (ref 4.0–10.5)
nRBC: 0 % (ref 0.0–0.2)

## 2021-03-25 LAB — LACTIC ACID, PLASMA: Lactic Acid, Venous: 0.9 mmol/L (ref 0.5–1.9)

## 2021-03-25 LAB — URINALYSIS, ROUTINE W REFLEX MICROSCOPIC
Bilirubin Urine: NEGATIVE
Glucose, UA: NEGATIVE mg/dL
Ketones, ur: NEGATIVE mg/dL
Nitrite: POSITIVE — AB
Protein, ur: 30 mg/dL — AB
Specific Gravity, Urine: 1.02 (ref 1.005–1.030)
pH: 6 (ref 5.0–8.0)

## 2021-03-25 LAB — RESP PANEL BY RT-PCR (FLU A&B, COVID) ARPGX2
Influenza A by PCR: NEGATIVE
Influenza B by PCR: NEGATIVE
SARS Coronavirus 2 by RT PCR: NEGATIVE

## 2021-03-25 LAB — URINALYSIS, MICROSCOPIC (REFLEX): WBC, UA: >50 WBC/hpf (ref 0–5)

## 2021-03-25 LAB — COMPREHENSIVE METABOLIC PANEL
ALT: 19 U/L (ref 0–44)
AST: 18 U/L (ref 15–41)
Albumin: 3.4 g/dL — ABNORMAL LOW (ref 3.5–5.0)
Alkaline Phosphatase: 153 U/L — ABNORMAL HIGH (ref 38–126)
Anion gap: 9 (ref 5–15)
BUN: 33 mg/dL — ABNORMAL HIGH (ref 6–20)
CO2: 22 mmol/L (ref 22–32)
Calcium: 8.8 mg/dL — ABNORMAL LOW (ref 8.9–10.3)
Chloride: 101 mmol/L (ref 98–111)
Creatinine, Ser: 2.35 mg/dL — ABNORMAL HIGH (ref 0.44–1.00)
GFR, Estimated: 24 mL/min — ABNORMAL LOW (ref >60)
Glucose, Bld: 226 mg/dL — ABNORMAL HIGH (ref 70–99)
Potassium: 5.1 mmol/L (ref 3.5–5.1)
Sodium: 132 mmol/L — ABNORMAL LOW (ref 135–145)
Total Bilirubin: 0.6 mg/dL (ref 0.3–1.2)
Total Protein: 7.2 g/dL (ref 6.5–8.1)

## 2021-03-25 LAB — CBG MONITORING, ED: Glucose-Capillary: 237 mg/dL — ABNORMAL HIGH (ref 70–99)

## 2021-03-25 MED ORDER — CEPHALEXIN 500 MG PO CAPS: 500.0000 mg | ORAL_CAPSULE | Freq: Four times a day (QID) | ORAL | 0 refills | Status: DC

## 2021-03-25 MED ORDER — SODIUM CHLORIDE 0.9 % IV SOLN
1.0000 g | Freq: Once | INTRAVENOUS | Status: AC
  Administered 2021-03-25: 1 g via INTRAVENOUS
  Filled 2021-03-25: qty 10

## 2021-03-25 MED ORDER — SODIUM CHLORIDE 0.9 % IV BOLUS
500.0000 mL | Freq: Once | INTRAVENOUS | Status: AC
  Administered 2021-03-25: 500 mL via INTRAVENOUS

## 2021-03-25 MED ORDER — SODIUM CHLORIDE 0.9 % IV BOLUS
1000.0000 mL | Freq: Once | INTRAVENOUS | Status: AC
  Administered 2021-03-25: 1000 mL via INTRAVENOUS

## 2021-03-25 MED ORDER — ONDANSETRON HCL 4 MG/2ML IJ SOLN
4.0000 mg | Freq: Once | INTRAMUSCULAR | Status: AC
  Administered 2021-03-25: 4 mg via INTRAVENOUS
  Filled 2021-03-25: qty 2

## 2021-03-25 NOTE — ED Provider Notes (Signed)
[facility] EMERGENCY DEPARTMENT Provider Note   CSN: [id number] Arrival date & time: 03/25/21  1651     History Chief Complaint  Patient presents with   Weakness    [name] is a 55 y.o. female.  She is here with a complaint of 2 days of feeling weak in her legs.  Thirsty all the time.  Nausea no vomiting.  She said this was how she felt before when she was septic.  She thinks she might had a fever yesterday.  Headache.  No cough or shortness of breath.  No abdominal pain vomiting or diarrhea.  She does have a little dysuria.  No falls.  The history is provided by the patient.  Weakness Severity:  Moderate Onset quality:  Gradual Duration:  2 days Timing:  Constant Progression:  Unchanged Chronicity:  Recurrent Relieved by:  Nothing Worsened by:  Activity Ineffective treatments:  Drinking fluids Associated symptoms: difficulty walking, dysuria, fever, frequency, headaches and nausea   Associated symptoms: no abdominal pain, no chest pain, no cough, no diarrhea, no falls, no foul-smelling urine, no sensory-motor deficit, no shortness of breath and no vomiting       Past Medical History:  Diagnosis Date   Charcot's joint of foot, left    Depression    Diabetes mellitus without complication ([place])    Hyperlipidemia    Hypertension    Neuromuscular disorder ([place])    Schizo affective schizophrenia ([place])    abilify and pazil    Patient Active Problem List   Diagnosis Date Noted   UTI (urinary tract infection) 12/04/2020   Unresponsive episode 12/04/2020   GERD (gastroesophageal reflux disease) 10/21/2020   Lactose intolerance 08/03/2020   Charcot's joint of foot, left    Schizophrenia ([place]) 02/20/2020   Chronic diarrhea 11/25/2019   History of colon cancer 11/25/2019   Iron deficiency anemia 07/25/2019   S/P partial colectomy 07/17/2019   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[place]) 05/28/2019   Anemia    Neuropathic foot ulcer, left, with fat layer exposed  ([place]) 02/05/2019   Morbid obesity ([place]) 05/10/2017   Long-term use of high-risk medication 05/10/2017   Hyperlipidemia associated with type 2 diabetes mellitus ([place]) 06/27/2016   Hammer toe of second toe of right foot 04/20/2016   Essential hypertension 06/29/2015   Schizoaffective disorder ([place]) 06/29/2015   DM (diabetes mellitus), type 2, uncontrolled w/neurologic complication 74/25/9563   Polyneuropathy 04/02/2014    Past Surgical History:  Procedure Laterality Date   CHOLECYSTECTOMY     COLONOSCOPY WITH PROPOFOL N/A 05/26/2019   Procedure: COLONOSCOPY WITH PROPOFOL;  Surgeon: Toledo, Benay Pike, MD;  Location: [facility];  Service: Gastroenterology;  Laterality: N/A;   ESOPHAGOGASTRODUODENOSCOPY (EGD) WITH PROPOFOL N/A 05/26/2019   Procedure: ESOPHAGOGASTRODUODENOSCOPY (EGD) WITH PROPOFOL;  Surgeon: Toledo, Benay Pike, MD;  Location: [facility];  Service: Gastroenterology;  Laterality: N/A;   IRRIGATION AND DEBRIDEMENT FOOT Right 02/26/2016   Procedure: RIGHT 2ND TOE DEBRIDEMENT;  Surgeon: Samara Deist, DPM;  Location: [facility] OR[facility];  Service: Podiatry;  Laterality: Right;   IRRIGATION AND DEBRIDEMENT FOOT Left 12/13/2018   Procedure: IRRIGATION AND DEBRIDEMENT FOOT;  Surgeon: Caroline More, DPM;  Location: [facility];  Service: Podiatry;  Laterality: Left;     OB History     Gravida  2   Para  2   Term  2   Preterm      AB      Living  2      SAB  IAB      Ectopic      Multiple      Live Births              Family History  Problem Relation Age of Onset   Breast cancer Cousin        maternal cousin   Hypertension Mother    Hyperthyroidism Mother    Dementia Mother    Prostate cancer Father    Diabetes Maternal Grandmother    Hypertension Maternal Grandmother    Cancer Maternal Grandmother        unknown type   Diabetes Maternal Grandfather    Hypertension Maternal Grandfather    Diabetes Paternal Grandmother    Hypertension Paternal  Grandmother    Diabetes Paternal Grandfather    Hypertension Paternal Grandfather     Social History   Tobacco Use   Smoking status: Every Day    Packs/day: 1.00    Years: 30.00    Pack years: 30.00    Types: Cigarettes    Last attempt to quit: 05/02/2009    Years since quitting: 11.9   Smokeless tobacco: Never  Vaping Use   Vaping Use: Never used  Substance Use Topics   Alcohol use: No   Drug use: No    Home Medications Prior to Admission medications   Medication Sig Start Date End Date Taking? Authorizing Provider  acetaminophen (TYLENOL) 325 MG tablet Take 2 tablets (650 mg total) by mouth every 6 (six) hours as needed for mild pain. 06/04/19   Nicole Kindred A, DO  ARIPiprazole (ABILIFY) 15 MG tablet Take 1 tablet (15 mg total) by mouth daily. 02/18/21   Lindell Spar, MD  atorvastatin (LIPITOR) 40 MG tablet Take 1 tablet (40 mg total) by mouth daily. 02/18/21   Lindell Spar, MD  blood glucose meter kit and supplies Dispense based on patient and insurance preference. Use up to four times daily as directed. (FOR ICD-10 E10.9, E11.9). 09/24/20   Lindell Spar, MD  buPROPion (WELLBUTRIN XL) 300 MG 24 hr tablet TAKE 1 TABLET BY MOUTH EVERY MORNING 02/18/21   Lindell Spar, MD  colestipol (COLESTID) 1 g tablet TAKE TWO (2) TABLETS BY MOUTH TWICE DAILY 02/18/21   Lindell Spar, MD  furosemide (LASIX) 40 MG tablet Take 1 tablet (40 mg total) by mouth daily. 02/18/21   Lindell Spar, MD  gabapentin (NEURONTIN) 300 MG capsule Take 1 capsule (300 mg total) by mouth at bedtime. 02/18/21   Lindell Spar, MD  Lancets (ONETOUCH DELICA PLUS JMEQAS34H) MISC USE TO TEST 3 TIMES DAILY. 06/29/20   Lindell Spar, MD  LANTUS SOLOSTAR 100 UNIT/ML Solostar Pen INJECT 25 UNITS INTO THE SKIN AT BEDTIME. 11/19/20   Lindell Spar, MD  losartan-hydrochlorothiazide [facility]) 50-12.5 MG tablet Take 1 tablet by mouth daily. 02/18/21   Lindell Spar, MD  metFORMIN (GLUCOPHAGE) 1000 MG tablet  Take 1 tablet (1,000 mg total) by mouth daily with breakfast. 02/18/21   Lindell Spar, MD  metoprolol succinate (TOPROL-XL) 50 MG 24 hr tablet Take 1 tablet (50 mg total) by mouth daily. Take with or immediately following a meal. 02/18/21   Lindell Spar, MD  mupirocin ointment (BACTROBAN) 2 % APPLY TO AFFECTED AREA TWICE DAILY 08/28/20   McDonald, Stephan Minister, DPM  ondansetron (ZOFRAN) 4 MG tablet Take 1 tablet (4 mg total) by mouth every 6 (six) hours. Patient taking differently: Take 4 mg by mouth every 8 (eight)  hours as needed for nausea or vomiting. 05/16/19   Wurst, [place], PA-C  ONETOUCH VERIO test strip CHECK BLOOD SUGAR 3 TIMES DAILY. 05/15/20   Karamalegos, Devonne Doughty, DO  pantoprazole (PROTONIX) 40 MG tablet Take 1 tablet (40 mg total) by mouth daily. 02/18/21   Lindell Spar, MD  polyethylene glycol-electrolytes (TRILYTE) 420 g solution Take 4,000 mLs by mouth as directed. 08/03/20   Harvel Quale, MD  sulfamethoxazole-trimethoprim (BACTRIM DS) 800-160 MG tablet Take 1 tablet by mouth 2 (two) times daily. 12/04/20   Noreene Larsson, NP  traZODone (DESYREL) 50 MG tablet Take 1 tablet (50 mg total) by mouth at bedtime as needed. for sleep 02/18/21   Lindell Spar, MD    Allergies    Patient has no known allergies.  Review of Systems   Review of Systems  Constitutional:  Positive for fever.  HENT:  Negative for sore throat.   Eyes:  Negative for visual disturbance.  Respiratory:  Negative for cough and shortness of breath.   Cardiovascular:  Negative for chest pain.  Gastrointestinal:  Positive for nausea. Negative for abdominal pain, diarrhea and vomiting.  Genitourinary:  Positive for dysuria and frequency.  Musculoskeletal:  Positive for gait problem. Negative for falls and neck pain.  Skin:  Negative for rash.  Neurological:  Positive for weakness and headaches.   Physical Exam Updated Vital Signs BP 126/76 (BP Location: Left Arm)   Pulse (!) 109   Temp 98.9  F (37.2 C) (Oral)   Resp 18   Ht '5\' 10"'  (1.778 m)   Wt 129.3 kg   BMI 40.89 kg/m   Physical Exam Vitals and nursing note reviewed.  Constitutional:      General: She is not in acute distress.    Appearance: Normal appearance. She is well-developed.  HENT:     Head: Normocephalic and atraumatic.  Eyes:     Conjunctiva/sclera: Conjunctivae normal.  Cardiovascular:     Rate and Rhythm: Normal rate and regular rhythm.     Heart sounds: No murmur heard. Pulmonary:     Effort: Pulmonary effort is normal. No respiratory distress.     Breath sounds: Normal breath sounds.  Abdominal:     Palpations: Abdomen is soft.     Tenderness: There is no abdominal tenderness. There is no guarding or rebound.  Musculoskeletal:        General: No swelling. Normal range of motion.     Cervical back: Neck supple.     Right lower leg: No edema.     Left lower leg: No edema.  Skin:    General: Skin is warm and dry.     Capillary Refill: Capillary refill takes less than 2 seconds.  Neurological:     General: No focal deficit present.     Mental Status: She is alert.  Psychiatric:        Mood and Affect: Mood normal.    ED Results / Procedures / Treatments   Labs (all labs ordered are listed, but only abnormal results are displayed) Labs Reviewed  COMPREHENSIVE METABOLIC PANEL - Abnormal; Notable for the following components:      Result Value   Sodium 132 (*)    Glucose, Bld 226 (*)    BUN 33 (*)    Creatinine, Ser 2.35 (*)    Calcium 8.8 (*)    Albumin 3.4 (*)    Alkaline Phosphatase 153 (*)    GFR, Estimated 24 (*)    All  other components within normal limits  CBC WITH DIFFERENTIAL/PLATELET - Abnormal; Notable for the following components:   WBC 12.5 (*)    RBC 3.55 (*)    Hemoglobin 10.5 (*)    HCT 32.8 (*)    Neutro Abs 9.3 (*)    Monocytes Absolute 1.1 (*)    Abs Immature Granulocytes 0.08 (*)    All other components within normal limits  URINALYSIS, ROUTINE W REFLEX  MICROSCOPIC - Abnormal; Notable for the following components:   Hgb urine dipstick SMALL (*)    Protein, ur 30 (*)    Nitrite POSITIVE (*)    Leukocytes,Ua MODERATE (*)    All other components within normal limits  URINALYSIS, MICROSCOPIC (REFLEX) - Abnormal; Notable for the following components:   Bacteria, UA MANY (*)    All other components within normal limits  CBG MONITORING, ED - Abnormal; Notable for the following components:   Glucose-Capillary 237 (*)    All other components within normal limits  RESP PANEL BY RT-PCR (FLU A&B, COVID) ARPGX2  URINE CULTURE  LACTIC ACID, PLASMA    EKG EKG Interpretation  Date/Time:  Thursday March 25 2021 17:55:02 EST Ventricular Rate:  107 PR Interval:  164 QRS Duration: 98 QT Interval:  311 QTC Calculation: 415 R Axis:   88 Text Interpretation: Sinus tachycardia Probable anteroseptal infarct, old No significant change since prior 7/22 Confirmed by Aletta Edouard [phone]) on 03/25/2021 6:14:36 PM  Radiology DG Chest Port 1 View  Result Date: 03/25/2021 CLINICAL DATA:  Generalized weakness for 2 days, tobacco abuse, hypertension EXAM: PORTABLE CHEST 1 VIEW COMPARISON:  11/03/2020 FINDINGS: Single frontal view of the chest demonstrates a stable cardiac silhouette. Chronic interstitial prominence throughout the lungs consistent with history of tobacco abuse. No airspace disease, effusion, or pneumothorax. No acute bony abnormalities. IMPRESSION: 1. Stable chest, no acute process. Electronically Signed   By: Randa Ngo M.D.   On: 03/25/2021 18:38    Procedures Procedures   Medications Ordered in ED Medications  sodium chloride 0.9 % bolus 1,000 mL (0 mLs Intravenous Stopped 03/25/21 1942)  ondansetron (ZOFRAN) injection 4 mg (4 mg Intravenous Given 03/25/21 1808)  sodium chloride 0.9 % bolus 500 mL (0 mLs Intravenous Stopped 03/25/21 2248)  cefTRIAXone (ROCEPHIN) 1 g in sodium chloride 0.9 % 100 mL IVPB (0 g Intravenous Stopped  03/25/21 2248)    ED Course  I have reviewed the triage vital signs and the nursing notes.  Pertinent labs & imaging results that were available during my care of the patient were reviewed by me and considered in my medical decision making (see chart for details).  Clinical Course as of 03/26/21 1018  Thu Mar 25, 2021  1831 Chest x-ray interpreted by me as no acute infiltrates.  Awaiting radiology reading. [MB]    Clinical Course User Index [MB] Hayden Rasmussen, MD   MDM Rules/Calculators/A&P                          [name] was evaluated in Emergency Department on 03/25/2021 for the symptoms described in the history of present illness. She was evaluated in the context of the global COVID-19 pandemic, which necessitated consideration that the patient might be at risk for infection with the SARS-CoV-2 virus that causes COVID-19. Institutional protocols and algorithms that pertain to the evaluation of patients at risk for COVID-19 are in a state of rapid change based on information released by regulatory bodies including  the [employer] and federal and state organizations. These policies and algorithms were followed during the patient's care in the ED.  This patient complains of generalized weakness; this involves an extensive number of treatment Options and is a complaint that carries with it a high risk of complications and Morbidity. The differential includes sepsis, Sirs, dehydration, infection, anemia, metabolic derangement  I ordered, reviewed and interpreted labs, which included CBC with mildly elevated white count, stable hemoglobin, chemistries elevated glucose BUN and creatinine, urinalysis with signs of infection, lactate normal, COVID and flu negative I ordered medication IV fluids IV antibiotics and nausea medication I ordered imaging studies which included chest x-ray and I independently    visualized and interpreted imaging which showed no acute infiltrates  Previous  records obtained and reviewed in epic no recent admissions  After the interventions stated above, I reevaluated the patient and found patient to be feeling better has been able to ambulate.  She understands the need for close follow-up with her provider and to finish her antibiotics.  Return instructions discussed.   Final Clinical Impression(s) / ED Diagnoses Final diagnoses:  Generalized weakness  Lower urinary tract infectious disease    Rx / DC Orders ED Discharge Orders          Ordered    cephALEXin (KEFLEX) 500 MG capsule  4 times daily        03/25/21 2253             Hayden Rasmussen, MD 03/26/21 1021

## 2021-03-25 NOTE — ED Triage Notes (Signed)
Pt arrived via [facility] w c/o genera;ized weakness that began 2 days ago with hyperglycemia.

## 2021-03-25 NOTE — Discharge Instructions (Signed)
You were seen in the emergency department for feeling generally weak.  You had lab work that showed you were slightly dehydrated.  You received some IV fluids with improvement in your symptoms.  Your urinalysis showed signs of infection and you were given an IV dose of antibiotics.  We have sent a prescription for oral antibiotics to your pharmacy that you can start tomorrow.  Please drink plenty of fluids.  Follow-up with your doctor.  Return to the emergency department if any worsening or concerning symptoms.

## 2021-03-28 LAB — URINE CULTURE: Culture: >=100000 — AB

## 2021-03-29 ENCOUNTER — Telehealth: Payer: Self-pay

## 2021-03-29 NOTE — Telephone Encounter (Signed)
Patient went to ER last Thursday for UTI for burning, can she use cream on the area to help with the burning.  Call back # [phone].

## 2021-03-31 ENCOUNTER — Telehealth: Payer: Self-pay | Admitting: Internal Medicine

## 2021-03-31 NOTE — Telephone Encounter (Signed)
Left message for patient

## 2021-03-31 NOTE — Telephone Encounter (Signed)
Pt called in for advise on what to take to help ease UTI.  Pt is currently taking antibiotic but is still having issues with urination / pain

## 2021-03-31 NOTE — Telephone Encounter (Signed)
Pt will need an appt with provider can be virtual but will need urine to address

## 2021-03-31 NOTE — Telephone Encounter (Signed)
If pt can not be contacted at numbers on file, can call [phone]

## 2021-04-15 ENCOUNTER — Telehealth: Payer: Self-pay

## 2021-04-16 ENCOUNTER — Ambulatory Visit: Payer: Medicaid Other

## 2021-05-17 IMAGING — CT CT ABD-PELV W/O CM
2 of 4 series · 17 of 46 positions shown, 19 images · non-contrast
Comparison: 07/17/2019 from [HOSPITAL]

CLINICAL DATA: Diffuse abdominal pain. Personal history of colon
carcinoma. Acute kidney injury.

EXAM:
CT ABDOMEN AND PELVIS WITHOUT CONTRAST
TECHNIQUE: Multidetector CT imaging of the abdomen and pelvis was performed
following the standard protocol without IV contrast.

[Series 2: axial st · axial · 0.73mm/px · z∈[-449,-4]mm · 14 of 103 slices shown, 16 images]
[im 7/103  soft-tissue]
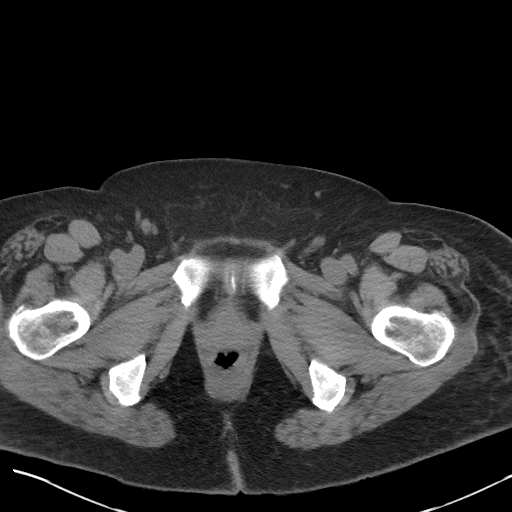
[im 7/103  bone]
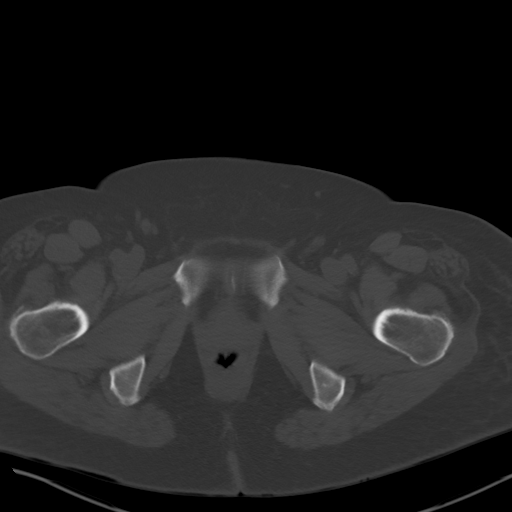
[im 13/103  soft-tissue]
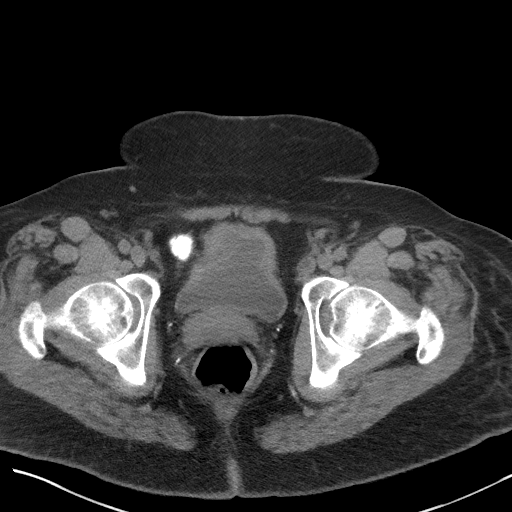
[im 20/103  soft-tissue]
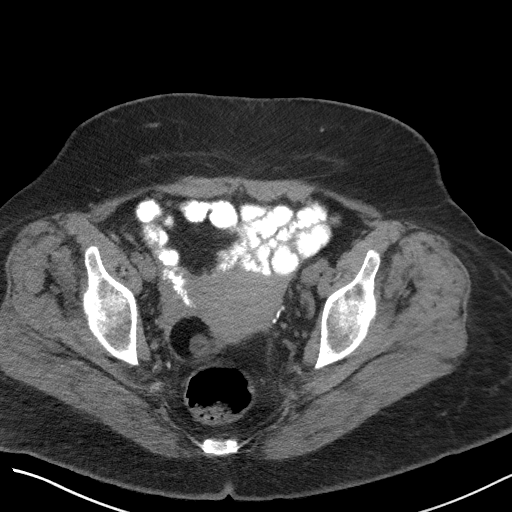
[im 26/103  soft-tissue]
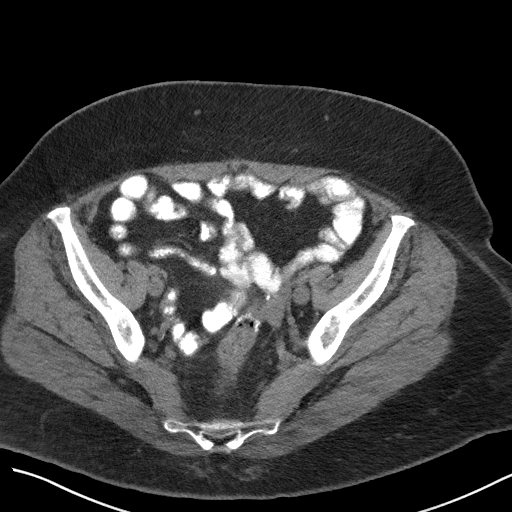
[im 32/103  soft-tissue]
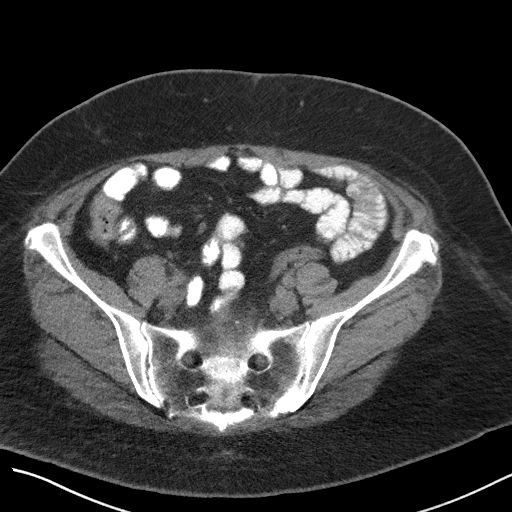
[im 39/103  soft-tissue]
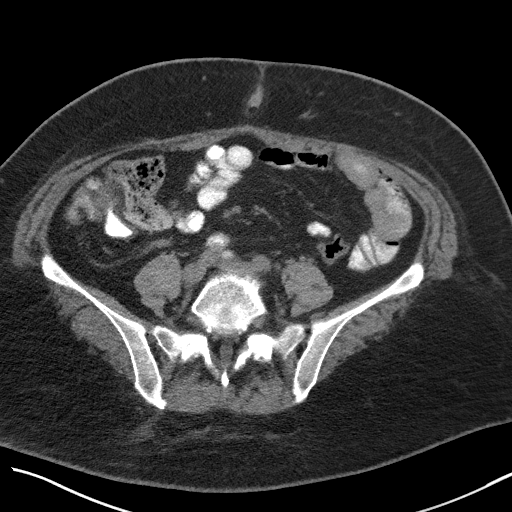
[im 45/103  soft-tissue]
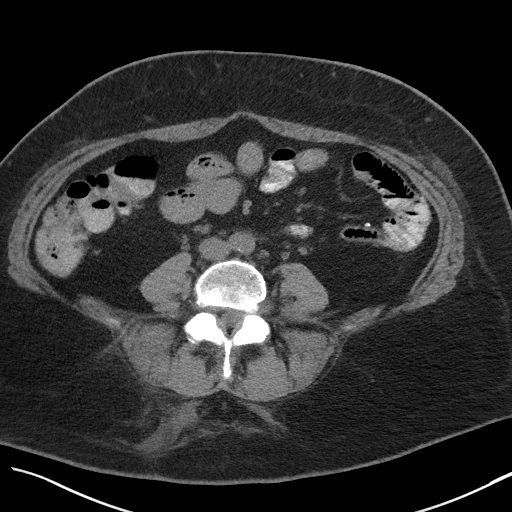
[im 58/103  soft-tissue]
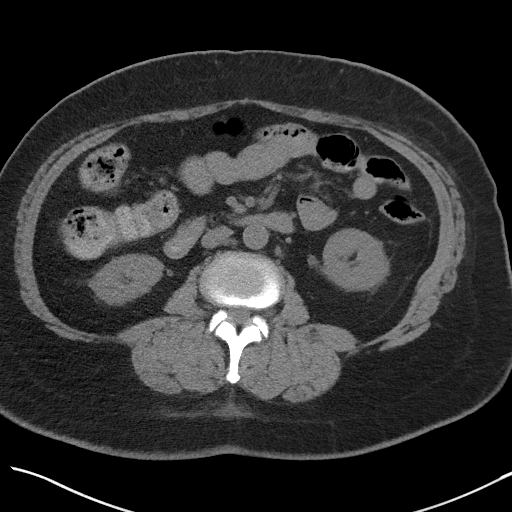
[im 64/103  soft-tissue]
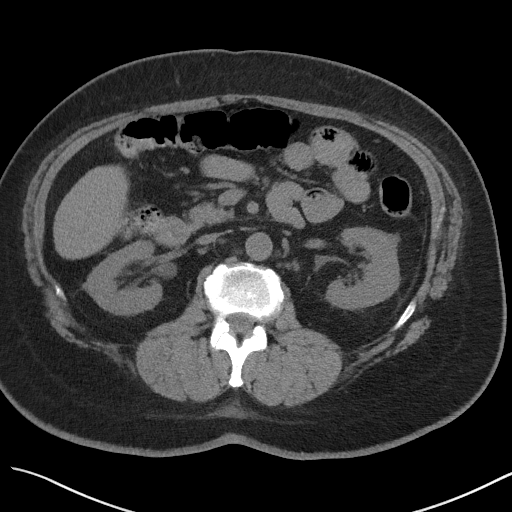
[im 64/103  bone]
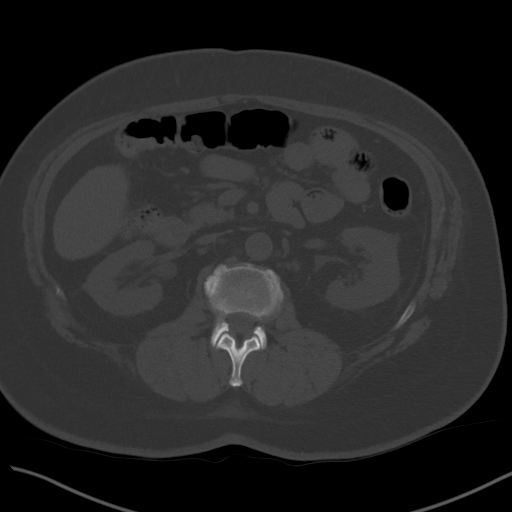
[im 71/103  soft-tissue]
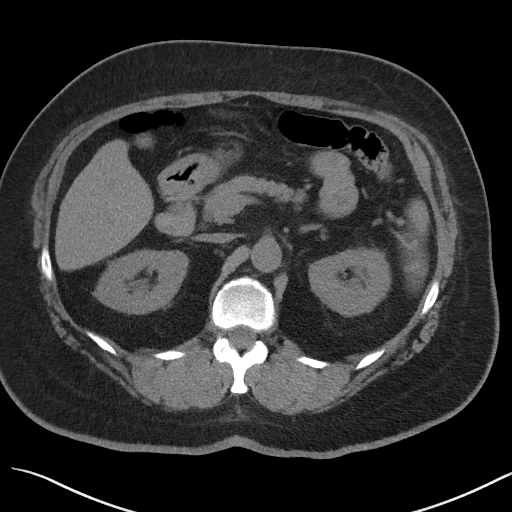
[im 77/103  soft-tissue]
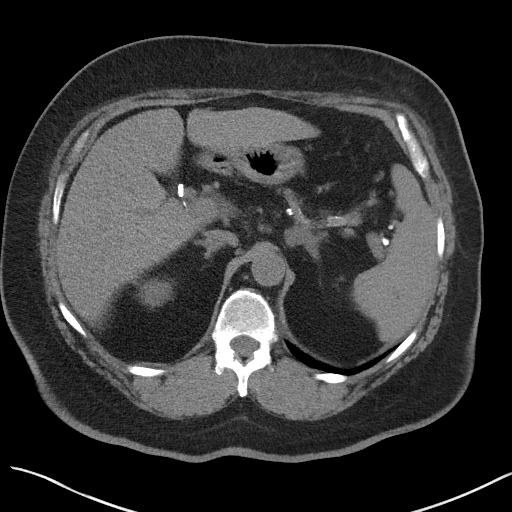
[im 83/103  soft-tissue]
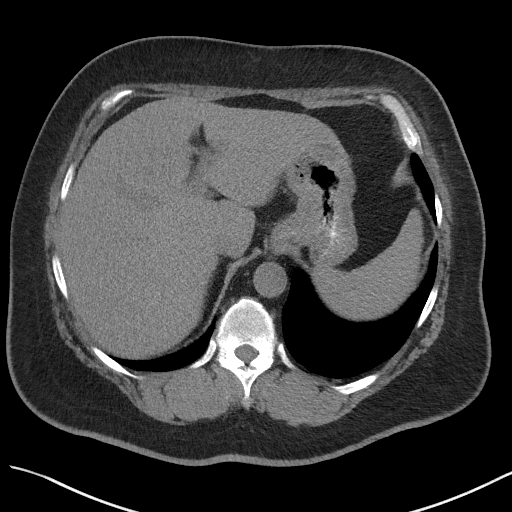
[im 90/103  soft-tissue]
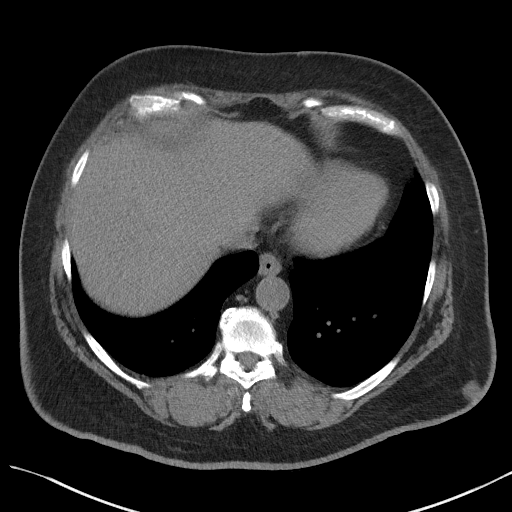
[im 96/103  soft-tissue]
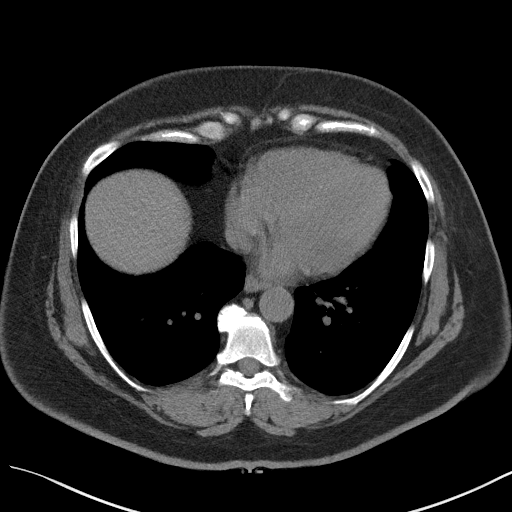

[Series 5: coronal st · coronal · 1.00mm/px · 3 of 101 slices shown]
[im 34/101  soft-tissue]
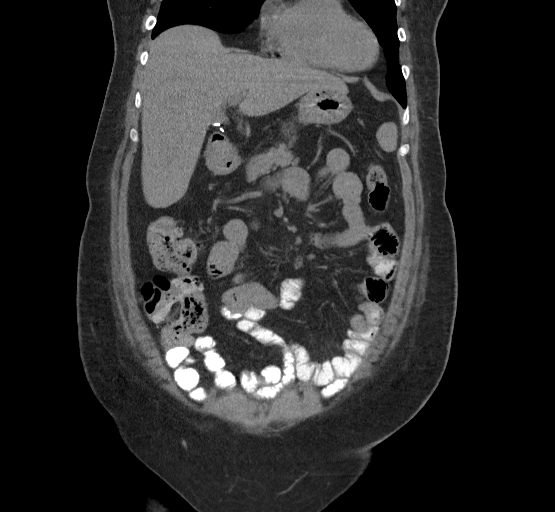
[im 45/101  soft-tissue]
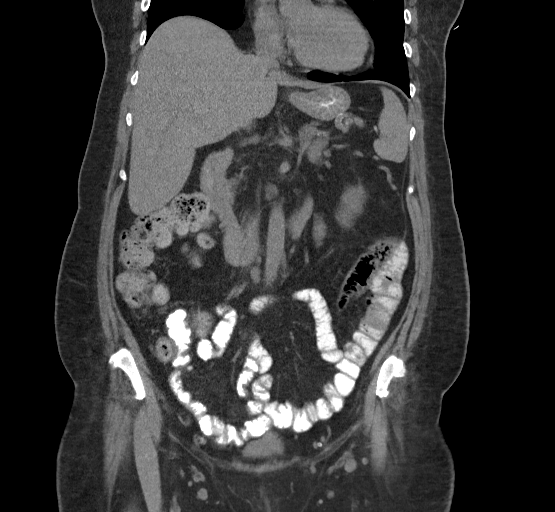
[im 56/101  soft-tissue]
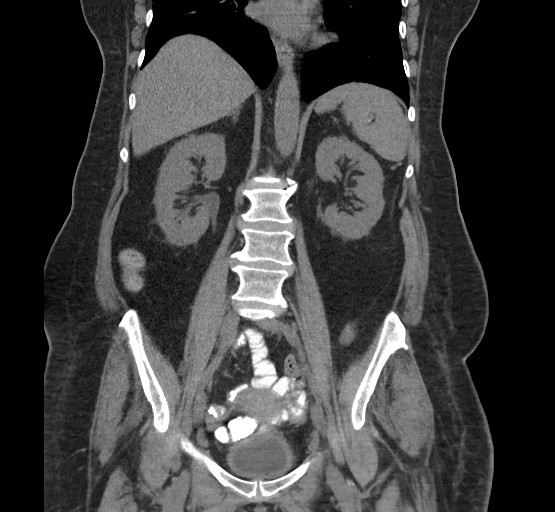

[17 of 46 positions shown; findings below may reference images not displayed]

FINDINGS: Lower chest: No acute findings.

Hepatobiliary: No mass visualized on this unenhanced exam. Prior
cholecystectomy. No evidence of biliary obstruction.

Pancreas: No mass or inflammatory process visualized on this
unenhanced exam.

Spleen:  Within normal limits in size.

Adrenals/Urinary tract: No evidence of urolithiasis or
hydronephrosis. Unremarkable unopacified urinary bladder.

Stomach/Bowel: Surgical anastomosis again seen in the sigmoid colon.
No mass identified. No evidence of obstruction, inflammatory
process, or abnormal fluid collections.

Vascular/Lymphatic: No pathologically enlarged lymph nodes
identified. No evidence of abdominal aortic aneurysm. Aortic
atherosclerotic calcification noted.

Reproductive: Normal appearance of uterus. A 4.0 x 3.3 cm right
ovarian mass containing macroscopic fat is seen, consistent with a
benign ovarian dermoid. This remains stable since prior exam. No
other masses identified. No evidence of inflammatory process or
abnormal fluid collections.

Other:  None.

Musculoskeletal:  No suspicious bone lesions identified.
IMPRESSION: No acute findings.

No evidence of recurrent or metastatic carcinoma.

Stable 4 cm benign right ovarian dermoid. If not surgically
resected, recommend annual imaging follow-up.

Aortic Atherosclerosis (TPUUC-P39.9).

## 2021-06-07 ENCOUNTER — Other Ambulatory Visit: Payer: Self-pay | Admitting: Internal Medicine

## 2021-06-07 DIAGNOSIS — I10 Essential (primary) hypertension: Secondary | ICD-10-CM

## 2021-06-07 DIAGNOSIS — E1142 Type 2 diabetes mellitus with diabetic polyneuropathy: Secondary | ICD-10-CM

## 2021-07-14 ENCOUNTER — Encounter: Payer: Self-pay | Admitting: Oncology

## 2021-07-14 DIAGNOSIS — E113413 Type 2 diabetes mellitus with severe nonproliferative diabetic retinopathy with macular edema, bilateral: Secondary | ICD-10-CM | POA: Diagnosis not present

## 2021-07-14 DIAGNOSIS — Z7689 Persons encountering health services in other specified circumstances: Secondary | ICD-10-CM | POA: Diagnosis not present

## 2021-07-14 LAB — HM DIABETES EYE EXAM

## 2021-07-19 ENCOUNTER — Ambulatory Visit: Payer: Medicaid Other | Admitting: Podiatry

## 2021-07-21 ENCOUNTER — Ambulatory Visit (INDEPENDENT_AMBULATORY_CARE_PROVIDER_SITE_OTHER): Payer: Medicare Other | Admitting: Podiatry

## 2021-07-21 ENCOUNTER — Encounter: Payer: Self-pay | Admitting: Oncology

## 2021-07-21 ENCOUNTER — Other Ambulatory Visit: Payer: Self-pay

## 2021-07-21 DIAGNOSIS — E119 Type 2 diabetes mellitus without complications: Secondary | ICD-10-CM

## 2021-07-21 DIAGNOSIS — M79674 Pain in right toe(s): Secondary | ICD-10-CM

## 2021-07-21 DIAGNOSIS — E1142 Type 2 diabetes mellitus with diabetic polyneuropathy: Secondary | ICD-10-CM

## 2021-07-21 DIAGNOSIS — M79675 Pain in left toe(s): Secondary | ICD-10-CM

## 2021-07-21 DIAGNOSIS — B351 Tinea unguium: Secondary | ICD-10-CM

## 2021-07-21 NOTE — Progress Notes (Signed)
?  Subjective:  ?Patient ID: [name], female    DOB: [birth year],  MRN: [id number] ? ?Chief Complaint  ?Patient presents with  ? Diabetes  ?  At risk diabetic foot care  ? ? ?56 y.o. female presents with the above complaint. History confirmed with patient.  Returns for follow-up with thickened elongated painful toenails ? ?Objective:  ?Physical Exam: ?warm, good capillary refill, no trophic changes or ulcerative lesions, normal DP and PT pulses, and abnormal sensory exam with loss of protective sensation. ?Left Foot: dystrophic yellowed discolored nail plates with subungual debris ?Right Foot: dystrophic yellowed discolored nail plates with subungual debris ? ? ?Assessment:  ? ?1. Pain due to onychomycosis of toenails of both feet   ?2. Diabetic polyneuropathy associated with type 2 diabetes mellitus ([place])   ? ? ? ?Plan:  ?Patient was evaluated and treated and all questions answered. ? ?Patient educated on diabetes. Discussed proper diabetic foot care and discussed risks and complications of disease. Educated patient in depth on reasons to return to the office immediately should he/she discover anything concerning or new on the feet. All questions answered. Discussed proper shoes as well.  ? ?Discussed the etiology and treatment options for the condition in detail with the patient. Educated patient on the topical and oral treatment options for mycotic nails. Recommended debridement of the nails today. Sharp and mechanical debridement performed of all painful and mycotic nails today. Nails debrided in length and thickness using a nail nipper to level of comfort. Discussed treatment options including appropriate shoe gear. Follow up as needed for painful nails. ? ? ?Return in about 3 months (around 10/21/2021) for at risk diabetic foot care.  ? ?

## 2021-07-26 ENCOUNTER — Other Ambulatory Visit: Payer: Self-pay | Admitting: Internal Medicine

## 2021-08-06 ENCOUNTER — Other Ambulatory Visit: Payer: Self-pay | Admitting: Internal Medicine

## 2021-08-06 DIAGNOSIS — I10 Essential (primary) hypertension: Secondary | ICD-10-CM

## 2021-08-09 ENCOUNTER — Other Ambulatory Visit: Payer: Medicare Other

## 2021-08-11 ENCOUNTER — Ambulatory Visit: Payer: Medicaid Other | Admitting: Internal Medicine

## 2021-08-13 DIAGNOSIS — E113511 Type 2 diabetes mellitus with proliferative diabetic retinopathy with macular edema, right eye: Secondary | ICD-10-CM | POA: Diagnosis not present

## 2021-08-13 DIAGNOSIS — H35033 Hypertensive retinopathy, bilateral: Secondary | ICD-10-CM | POA: Diagnosis not present

## 2021-08-13 DIAGNOSIS — H3582 Retinal ischemia: Secondary | ICD-10-CM | POA: Diagnosis not present

## 2021-08-13 DIAGNOSIS — E113412 Type 2 diabetes mellitus with severe nonproliferative diabetic retinopathy with macular edema, left eye: Secondary | ICD-10-CM | POA: Diagnosis not present

## 2021-09-07 ENCOUNTER — Other Ambulatory Visit: Payer: Self-pay | Admitting: Internal Medicine

## 2021-09-07 DIAGNOSIS — I10 Essential (primary) hypertension: Secondary | ICD-10-CM

## 2021-09-09 ENCOUNTER — Other Ambulatory Visit: Payer: Self-pay | Admitting: Internal Medicine

## 2021-09-14 ENCOUNTER — Ambulatory Visit (INDEPENDENT_AMBULATORY_CARE_PROVIDER_SITE_OTHER): Payer: Medicare Other | Admitting: Internal Medicine

## 2021-09-14 ENCOUNTER — Encounter: Payer: Self-pay | Admitting: Internal Medicine

## 2021-09-14 VITALS — BP 126/82 | HR 84 | Resp 18 | Ht 70.0 in | Wt 254.0 lb

## 2021-09-14 DIAGNOSIS — E785 Hyperlipidemia, unspecified: Secondary | ICD-10-CM | POA: Diagnosis not present

## 2021-09-14 DIAGNOSIS — E559 Vitamin D deficiency, unspecified: Secondary | ICD-10-CM | POA: Diagnosis not present

## 2021-09-14 DIAGNOSIS — I10 Essential (primary) hypertension: Secondary | ICD-10-CM | POA: Diagnosis not present

## 2021-09-14 DIAGNOSIS — Z7689 Persons encountering health services in other specified circumstances: Secondary | ICD-10-CM | POA: Diagnosis not present

## 2021-09-14 DIAGNOSIS — E1149 Type 2 diabetes mellitus with other diabetic neurological complication: Secondary | ICD-10-CM

## 2021-09-14 DIAGNOSIS — Z23 Encounter for immunization: Secondary | ICD-10-CM | POA: Diagnosis not present

## 2021-09-14 DIAGNOSIS — F251 Schizoaffective disorder, depressive type: Secondary | ICD-10-CM

## 2021-09-14 DIAGNOSIS — E1169 Type 2 diabetes mellitus with other specified complication: Secondary | ICD-10-CM | POA: Diagnosis not present

## 2021-09-14 DIAGNOSIS — Z1231 Encounter for screening mammogram for malignant neoplasm of breast: Secondary | ICD-10-CM | POA: Diagnosis not present

## 2021-09-14 LAB — POCT GLYCOSYLATED HEMOGLOBIN (HGB A1C): HbA1c POC (<> result, manual entry): 8.8 % (ref 4.0–5.6)

## 2021-09-14 MED ORDER — BLOOD GLUCOSE METER KIT: PACK | 0 refills | Status: DC

## 2021-09-14 NOTE — Assessment & Plan Note (Signed)
BP Readings from Last 1 Encounters:  ?09/14/21 126/82  ? ?Well-controlled with Losartan-HCTZ and Metoprolol ?Also takes Lasix for LE swelling, will address if she needs it further in the next visit ?Counseled for compliance with the medications ?Advised DASH diet and moderate exercise/walking, at least 150 mins/week ?

## 2021-09-14 NOTE — Assessment & Plan Note (Addendum)
HbA1C: 8.8, better than prior ?On Lantus 30 U QD and Metformin 1000 mg QD ?Advised to follow diabetic diet ?On statin ?F/u CMP and lipid panel ?Diabetic foot exam: Today ?Diabetic eye exam: Advised to follow up with Ophthalmology for diabetic eye exam ?

## 2021-09-14 NOTE — Patient Instructions (Signed)
Please continue taking medications as prescribed. ? ?Please follow up with your Psychiatrist. ? ?Please follow low carb diet and ambulate as tolerated. ?

## 2021-09-14 NOTE — Assessment & Plan Note (Signed)
On Abilify and Wellbutrin, although unclear whether she is taking them ?On Trazodone ?Followed by Psychiatry - needs to have follow up visit and clarification of medications ?

## 2021-09-14 NOTE — Assessment & Plan Note (Signed)
On Lipitor Check lipid profile 

## 2021-09-14 NOTE — Progress Notes (Signed)
? ?Established Patient Office Visit ? ?Subjective:  ?Patient ID: [name], female    DOB: [birth year]  Age: 56 y.o. MRN: [id number] ? ?CC:  ?Chief Complaint  ?Patient presents with  ? Follow-up  ?  Follow needs diabetic meter hasnt been able to check sugar   ? ? ?HPI ?[name] is a 56 y.o. female with past medical history of HTN, uncontrolled type 2 DM with neuropathy, sigmoid colon ca. S/p partial colectomy, schizoaffective disorder and obesity who presents for f/u of her chronic medical conditions. She has not followed up since 06/22. ? ?DM: HbA1C is 8.8 in the office today. She states that she takes Lantus 30 U qHS and Metformin, but compliance is questionable. Denies any polyuria or polydipsia. ? ?HTN: BP is well-controlled. Takes medications regularly. Patient denies headache, dizziness, chest pain, dyspnea or palpitations. ? ?She needs to schedule appointment with Psychiatry for follow up of Schizoaffective disorder. Does not appear to have hallucinations.  ? ?She received second dose of Shingrix vaccine today. ? ? ?Past Medical History:  ?Diagnosis Date  ? Charcot's joint of foot, left   ? Depression   ? Diabetes mellitus without complication ([place])   ? Hyperlipidemia   ? Hypertension   ? Neuromuscular disorder ([place])   ? Schizo affective schizophrenia ([place])   ? abilify and pazil  ? ? ?Past Surgical History:  ?Procedure Laterality Date  ? CHOLECYSTECTOMY    ? COLONOSCOPY WITH PROPOFOL N/A 05/26/2019  ? Procedure: COLONOSCOPY WITH PROPOFOL;  Surgeon: Toledo, Benay Pike, MD;  Location: [facility];  Service: Gastroenterology;  Laterality: N/A;  ? ESOPHAGOGASTRODUODENOSCOPY (EGD) WITH PROPOFOL N/A 05/26/2019  ? Procedure: ESOPHAGOGASTRODUODENOSCOPY (EGD) WITH PROPOFOL;  Surgeon: Toledo, Benay Pike, MD;  Location: [facility];  Service: Gastroenterology;  Laterality: N/A;  ? IRRIGATION AND DEBRIDEMENT FOOT Right 02/26/2016  ? Procedure: RIGHT 2ND TOE DEBRIDEMENT;  Surgeon: Samara Deist, DPM;   Location: [facility];  Service: Podiatry;  Laterality: Right;  ? IRRIGATION AND DEBRIDEMENT FOOT Left 12/13/2018  ? Procedure: IRRIGATION AND DEBRIDEMENT FOOT;  Surgeon: Caroline More, DPM;  Location: [facility];  Service: Podiatry;  Laterality: Left;  ? ? ?Family History  ?Problem Relation Age of Onset  ? Breast cancer Cousin   ?     maternal cousin  ? Hypertension Mother   ? Hyperthyroidism Mother   ? Dementia Mother   ? Prostate cancer Father   ? Diabetes Maternal Grandmother   ? Hypertension Maternal Grandmother   ? Cancer Maternal Grandmother   ?     unknown type  ? Diabetes Maternal Grandfather   ? Hypertension Maternal Grandfather   ? Diabetes Paternal Grandmother   ? Hypertension Paternal Grandmother   ? Diabetes Paternal Grandfather   ? Hypertension Paternal Grandfather   ? ? ?Social History  ? ?Socioeconomic History  ? Marital status: Divorced  ?  Spouse name: Not on file  ? Number of children: 2  ? Years of education: Not on file  ? Highest education level: Not on file  ?Occupational History  ? Not on file  ?Tobacco Use  ? Smoking status: Every Day  ?  Packs/day: 1.00  ?  Years: 30.00  ?  Pack years: 30.00  ?  Types: Cigarettes  ?  Last attempt to quit: 05/02/2009  ?  Years since quitting: 12.3  ? Smokeless tobacco: Never  ?Vaping Use  ? Vaping Use: Never used  ?Substance and Sexual Activity  ? Alcohol use: No  ? Drug use:  No  ? Sexual activity: Not Currently  ?Other Topics Concern  ? Not on file  ?Social History Narrative  ? At home with mom, 33. Lives in brothers house who moved out. Sister comes by every other day to check on patient and mom.  ? ?Social Determinants of Health  ? ?Financial Resource Strain: Low Risk   ? Difficulty of Paying Living Expenses: Not hard at all  ?Food Insecurity: No Food Insecurity  ? Worried About Charity fundraiser in the Last Year: Never true  ? Ran Out of Food in the Last Year: Never true  ?Transportation Needs: No Transportation Needs  ? Lack of Transportation (Medical): No   ? Lack of Transportation (Non-Medical): No  ?Physical Activity: Inactive  ? Days of Exercise per Week: 0 days  ? Minutes of Exercise per Session: 0 min  ?Stress: No Stress Concern Present  ? Feeling of Stress : Only a little  ?Social Connections: Socially Isolated  ? Frequency of Communication with Friends and Family: More than three times a week  ? Frequency of Social Gatherings with Friends and Family: More than three times a week  ? Attends Religious Services: Never  ? Active Member of Clubs or Organizations: No  ? Attends Archivist Meetings: Never  ? Marital Status: Divorced  ?Intimate Partner Violence: Not At Risk  ? Fear of Current or Ex-Partner: No  ? Emotionally Abused: No  ? Physically Abused: No  ? Sexually Abused: No  ? ? ?Outpatient Medications Prior to Visit  ?Medication Sig Dispense Refill  ? acetaminophen (TYLENOL) 325 MG tablet Take 2 tablets (650 mg total) by mouth every 6 (six) hours as needed for mild pain.    ? ARIPiprazole (ABILIFY) 15 MG tablet Take 1 tablet (15 mg total) by mouth daily. 30 tablet 10  ? atorvastatin (LIPITOR) 40 MG tablet Take 1 tablet (40 mg total) by mouth daily. 90 tablet 1  ? blood glucose meter kit and supplies Dispense based on patient and insurance preference. Use up to four times daily as directed. (FOR ICD-10 E10.9, E11.9). 1 each 0  ? buPROPion (WELLBUTRIN XL) 300 MG 24 hr tablet TAKE 1 TABLET BY MOUTH EVERY MORNING 30 tablet 10  ? colestipol (COLESTID) 1 g tablet TAKE TWO (2) TABLETS BY MOUTH TWICE DAILY 120 tablet 10  ? furosemide (LASIX) 40 MG tablet TAKE (1) TABLET BY MOUTH ONCE DAILY. 30 tablet 0  ? gabapentin (NEURONTIN) 300 MG capsule Take 1 capsule (300 mg total) by mouth at bedtime. (Patient taking differently: Take 300 mg by mouth daily.) 90 capsule 1  ? hydrochlorothiazide (HYDRODIURIL) 12.5 MG tablet Take 12.5 mg by mouth daily.    ? ibuprofen (ADVIL) 200 MG tablet Take 400 mg by mouth every 6 (six) hours as needed.    ? Lancets (ONETOUCH  DELICA PLUS [id number]) MISC USE TO TEST 3 TIMES DAILY. 100 each 0  ? LANTUS SOLOSTAR 100 UNIT/ML Solostar Pen INJECT 30 UNITS INTO THE SKIN ONCE DAILY AT BEDTIME 15 mL 0  ? losartan-hydrochlorothiazide (HYZAAR) 50-12.5 MG tablet TAKE (1) TABLET BY MOUTH ONCE DAILY. 30 tablet 0  ? metFORMIN (GLUCOPHAGE) 1000 MG tablet Take 1 tablet (1,000 mg total) by mouth daily with breakfast. 90 tablet 1  ? metoprolol succinate (TOPROL-XL) 50 MG 24 hr tablet Take 1 tablet (50 mg total) by mouth daily. Take with or immediately following a meal. 90 tablet 1  ? mupirocin ointment (BACTROBAN) 2 % APPLY TO AFFECTED AREA TWICE DAILY  22 g 10  ? ondansetron (ZOFRAN) 4 MG tablet Take 1 tablet (4 mg total) by mouth every 6 (six) hours. (Patient taking differently: Take 4 mg by mouth every 8 (eight) hours as needed for nausea or vomiting.) 12 tablet 0  ? ONETOUCH VERIO test strip CHECK BLOOD SUGAR 3 TIMES DAILY. 100 strip 0  ? pantoprazole (PROTONIX) 40 MG tablet Take 1 tablet (40 mg total) by mouth daily. 90 tablet 1  ? polyethylene glycol-electrolytes (TRILYTE) 420 g solution Take 4,000 mLs by mouth as directed. 4000 mL 0  ? traZODone (DESYREL) 50 MG tablet Take 1 tablet (50 mg total) by mouth at bedtime as needed. for sleep 30 tablet 3  ? cephALEXin (KEFLEX) 500 MG capsule Take 1 capsule (500 mg total) by mouth 4 (four) times daily. 28 capsule 0  ? sulfamethoxazole-trimethoprim (BACTRIM DS) 800-160 MG tablet Take 1 tablet by mouth 2 (two) times daily. (Patient not taking: Reported on 03/25/2021) 10 tablet 0  ? ?No facility-administered medications prior to visit.  ? ? ?No Known Allergies ? ?ROS ?Review of Systems  ?Constitutional:  Negative for chills and fever.  ?HENT:  Negative for congestion, sinus pressure, sinus pain and sore throat.   ?Eyes:  Negative for pain and discharge.  ?Respiratory:  Negative for cough and shortness of breath.   ?Cardiovascular:  Negative for chest pain and palpitations.  ?Gastrointestinal:  Negative for  abdominal pain, constipation, nausea and vomiting.  ?Endocrine: Negative for polydipsia and polyuria.  ?Genitourinary:  Negative for dysuria and hematuria.  ?Musculoskeletal:  Positive for arthralgias. Negati

## 2021-09-15 ENCOUNTER — Telehealth: Payer: Self-pay | Admitting: Internal Medicine

## 2021-09-15 NOTE — Telephone Encounter (Signed)
Pt stated she felt bad with body aches and in general felt down advised this is normal reaction to shingrix pt then stated she had been getting somewhat short of breath and angry advised if she was getting sob can go to urgent care or ER for evaluation with verbal understanding  ?

## 2021-09-15 NOTE — Telephone Encounter (Signed)
Patient called in regard to shingrix shot  ? ?Patient received shot 5/16 ?Has been feeling funny since, wants a call back in regard. ?

## 2021-09-16 DIAGNOSIS — E113511 Type 2 diabetes mellitus with proliferative diabetic retinopathy with macular edema, right eye: Secondary | ICD-10-CM | POA: Diagnosis not present

## 2021-09-16 LAB — MICROALBUMIN / CREATININE URINE RATIO
Creatinine, Urine: 75.4 mg/dL
Microalb/Creat Ratio: 53 mg/g creat — ABNORMAL HIGH (ref 0–29)
Microalbumin, Urine: 40.3 ug/mL

## 2021-09-20 ENCOUNTER — Encounter: Payer: Self-pay | Admitting: *Deleted

## 2021-09-21 ENCOUNTER — Telehealth: Payer: Self-pay | Admitting: Internal Medicine

## 2021-09-21 ENCOUNTER — Other Ambulatory Visit: Payer: Self-pay | Admitting: Internal Medicine

## 2021-09-21 DIAGNOSIS — E1169 Type 2 diabetes mellitus with other specified complication: Secondary | ICD-10-CM

## 2021-09-21 DIAGNOSIS — F251 Schizoaffective disorder, depressive type: Secondary | ICD-10-CM

## 2021-09-21 DIAGNOSIS — E1149 Type 2 diabetes mellitus with other diabetic neurological complication: Secondary | ICD-10-CM

## 2021-09-21 DIAGNOSIS — I10 Essential (primary) hypertension: Secondary | ICD-10-CM

## 2021-09-21 NOTE — Telephone Encounter (Signed)
Pt called stating she is wanting to know if she can please get another order for a home health nurse. States she had one before and is needing help again. Can this please be done?

## 2021-09-26 DIAGNOSIS — F17210 Nicotine dependence, cigarettes, uncomplicated: Secondary | ICD-10-CM | POA: Diagnosis not present

## 2021-09-26 DIAGNOSIS — Z9181 History of falling: Secondary | ICD-10-CM | POA: Diagnosis not present

## 2021-09-26 DIAGNOSIS — K219 Gastro-esophageal reflux disease without esophagitis: Secondary | ICD-10-CM | POA: Diagnosis not present

## 2021-09-26 DIAGNOSIS — E1169 Type 2 diabetes mellitus with other specified complication: Secondary | ICD-10-CM | POA: Diagnosis not present

## 2021-09-26 DIAGNOSIS — I10 Essential (primary) hypertension: Secondary | ICD-10-CM | POA: Diagnosis not present

## 2021-09-26 DIAGNOSIS — Z85038 Personal history of other malignant neoplasm of large intestine: Secondary | ICD-10-CM | POA: Diagnosis not present

## 2021-09-26 DIAGNOSIS — Z794 Long term (current) use of insulin: Secondary | ICD-10-CM | POA: Diagnosis not present

## 2021-09-26 DIAGNOSIS — H547 Unspecified visual loss: Secondary | ICD-10-CM | POA: Diagnosis not present

## 2021-09-26 DIAGNOSIS — Z7984 Long term (current) use of oral hypoglycemic drugs: Secondary | ICD-10-CM | POA: Diagnosis not present

## 2021-09-26 DIAGNOSIS — E11610 Type 2 diabetes mellitus with diabetic neuropathic arthropathy: Secondary | ICD-10-CM | POA: Diagnosis not present

## 2021-09-26 DIAGNOSIS — F32A Depression, unspecified: Secondary | ICD-10-CM | POA: Diagnosis not present

## 2021-09-26 DIAGNOSIS — E1140 Type 2 diabetes mellitus with diabetic neuropathy, unspecified: Secondary | ICD-10-CM | POA: Diagnosis not present

## 2021-09-26 DIAGNOSIS — M14672 Charcot's joint, left ankle and foot: Secondary | ICD-10-CM | POA: Diagnosis not present

## 2021-09-26 DIAGNOSIS — E785 Hyperlipidemia, unspecified: Secondary | ICD-10-CM | POA: Diagnosis not present

## 2021-09-26 DIAGNOSIS — M14671 Charcot's joint, right ankle and foot: Secondary | ICD-10-CM | POA: Diagnosis not present

## 2021-09-28 ENCOUNTER — Other Ambulatory Visit: Payer: Self-pay | Admitting: Internal Medicine

## 2021-09-28 ENCOUNTER — Telehealth: Payer: Self-pay | Admitting: Internal Medicine

## 2021-09-28 DIAGNOSIS — E1149 Type 2 diabetes mellitus with other diabetic neurological complication: Secondary | ICD-10-CM

## 2021-09-28 MED ORDER — DEXCOM G7 RECEIVER DEVI: 0 refills | Status: DC

## 2021-09-28 MED ORDER — DEXCOM G7 SENSOR MISC: 11 refills | Status: DC

## 2021-09-28 NOTE — Telephone Encounter (Signed)
Pam with [facility], called stating pt is needing personal care services. They are needing a blood sugar monitor that stick to her arm to tell her the level. Having difficulty. Advises full time caregiver. Can you please call Pam & allow her to give you further info on this?    Pam [facility] [phone]

## 2021-09-28 NOTE — Telephone Encounter (Signed)
Home health aide is requesting dexcom or freestyle libra to be sent in for glucose checks please advise

## 2021-10-07 ENCOUNTER — Other Ambulatory Visit: Payer: Self-pay | Admitting: Internal Medicine

## 2021-10-07 DIAGNOSIS — E1142 Type 2 diabetes mellitus with diabetic polyneuropathy: Secondary | ICD-10-CM

## 2021-10-07 DIAGNOSIS — E113412 Type 2 diabetes mellitus with severe nonproliferative diabetic retinopathy with macular edema, left eye: Secondary | ICD-10-CM | POA: Diagnosis not present

## 2021-10-07 DIAGNOSIS — I10 Essential (primary) hypertension: Secondary | ICD-10-CM

## 2021-10-11 ENCOUNTER — Ambulatory Visit (HOSPITAL_COMMUNITY)
Admission: RE | Admit: 2021-10-11 | Discharge: 2021-10-11 | Disposition: A | Discharge status: Home / Self Care | Payer: Medicare Other | Source: Ambulatory Visit | Attending: Internal Medicine | Admitting: Internal Medicine

## 2021-10-11 DIAGNOSIS — Z1231 Encounter for screening mammogram for malignant neoplasm of breast: Secondary | ICD-10-CM | POA: Insufficient documentation

## 2021-10-21 ENCOUNTER — Ambulatory Visit: Payer: Medicare Other | Admitting: Podiatry

## 2021-10-25 ENCOUNTER — Other Ambulatory Visit: Payer: Self-pay | Admitting: Internal Medicine

## 2021-10-26 DIAGNOSIS — E1169 Type 2 diabetes mellitus with other specified complication: Secondary | ICD-10-CM

## 2021-10-26 DIAGNOSIS — E1140 Type 2 diabetes mellitus with diabetic neuropathy, unspecified: Secondary | ICD-10-CM

## 2021-10-26 DIAGNOSIS — F259 Schizoaffective disorder, unspecified: Secondary | ICD-10-CM

## 2021-10-26 DIAGNOSIS — H547 Unspecified visual loss: Secondary | ICD-10-CM

## 2021-10-26 DIAGNOSIS — K219 Gastro-esophageal reflux disease without esophagitis: Secondary | ICD-10-CM

## 2021-10-26 DIAGNOSIS — I10 Essential (primary) hypertension: Secondary | ICD-10-CM

## 2021-10-26 DIAGNOSIS — F32A Depression, unspecified: Secondary | ICD-10-CM

## 2021-10-26 DIAGNOSIS — M14672 Charcot's joint, left ankle and foot: Secondary | ICD-10-CM

## 2021-10-26 DIAGNOSIS — F17210 Nicotine dependence, cigarettes, uncomplicated: Secondary | ICD-10-CM

## 2021-10-26 DIAGNOSIS — E785 Hyperlipidemia, unspecified: Secondary | ICD-10-CM

## 2021-10-26 DIAGNOSIS — F39 Unspecified mood [affective] disorder: Secondary | ICD-10-CM

## 2021-10-26 DIAGNOSIS — E11610 Type 2 diabetes mellitus with diabetic neuropathic arthropathy: Secondary | ICD-10-CM

## 2021-10-26 DIAGNOSIS — E669 Obesity, unspecified: Secondary | ICD-10-CM

## 2021-10-26 DIAGNOSIS — M14671 Charcot's joint, right ankle and foot: Secondary | ICD-10-CM

## 2021-11-04 ENCOUNTER — Other Ambulatory Visit: Payer: Self-pay | Admitting: Internal Medicine

## 2021-11-04 DIAGNOSIS — I10 Essential (primary) hypertension: Secondary | ICD-10-CM

## 2021-11-04 DIAGNOSIS — E1142 Type 2 diabetes mellitus with diabetic polyneuropathy: Secondary | ICD-10-CM

## 2021-11-16 ENCOUNTER — Encounter: Payer: Self-pay | Admitting: Oncology

## 2021-11-18 DIAGNOSIS — E113511 Type 2 diabetes mellitus with proliferative diabetic retinopathy with macular edema, right eye: Secondary | ICD-10-CM | POA: Diagnosis not present

## 2021-11-23 ENCOUNTER — Telehealth: Payer: Self-pay

## 2021-11-23 ENCOUNTER — Encounter: Payer: Self-pay | Admitting: Internal Medicine

## 2021-11-23 ENCOUNTER — Other Ambulatory Visit: Payer: Self-pay | Admitting: *Deleted

## 2021-11-23 ENCOUNTER — Ambulatory Visit (INDEPENDENT_AMBULATORY_CARE_PROVIDER_SITE_OTHER): Payer: Medicare HMO | Admitting: Internal Medicine

## 2021-11-23 VITALS — BP 138/80 | HR 78 | Resp 16 | Ht 70.5 in | Wt 261.0 lb

## 2021-11-23 DIAGNOSIS — C187 Malignant neoplasm of sigmoid colon: Secondary | ICD-10-CM

## 2021-11-23 DIAGNOSIS — E1149 Type 2 diabetes mellitus with other diabetic neurological complication: Secondary | ICD-10-CM

## 2021-11-23 DIAGNOSIS — E1169 Type 2 diabetes mellitus with other specified complication: Secondary | ICD-10-CM | POA: Diagnosis not present

## 2021-11-23 DIAGNOSIS — E785 Hyperlipidemia, unspecified: Secondary | ICD-10-CM | POA: Diagnosis not present

## 2021-11-23 DIAGNOSIS — I10 Essential (primary) hypertension: Secondary | ICD-10-CM

## 2021-11-23 DIAGNOSIS — F251 Schizoaffective disorder, depressive type: Secondary | ICD-10-CM

## 2021-11-23 DIAGNOSIS — Z0001 Encounter for general adult medical examination with abnormal findings: Secondary | ICD-10-CM | POA: Diagnosis not present

## 2021-11-23 DIAGNOSIS — E559 Vitamin D deficiency, unspecified: Secondary | ICD-10-CM

## 2021-11-23 MED ORDER — DEXCOM G7 SENSOR MISC: 11 refills | Status: DC

## 2021-11-23 MED ORDER — DEXCOM G7 RECEIVER DEVI: 0 refills | Status: DC

## 2021-11-23 MED ORDER — METFORMIN HCL 1000 MG PO TABS: 1000.0000 mg | ORAL_TABLET | Freq: Every day | ORAL | 1 refills | Status: DC

## 2021-11-23 MED ORDER — DEXCOM G7 SENSOR MISC: 11 refills | Status: AC

## 2021-11-23 MED ORDER — METFORMIN HCL 1000 MG PO TABS: 1000.0000 mg | ORAL_TABLET | Freq: Two times a day (BID) | ORAL | 1 refills | Status: DC

## 2021-11-23 NOTE — Assessment & Plan Note (Signed)
On Lipitor Check lipid profile 

## 2021-11-23 NOTE — Telephone Encounter (Signed)
Called patient she will contact her insurance company and call our office back.

## 2021-11-23 NOTE — Assessment & Plan Note (Signed)
status post partial colectomy in January 2021 Needs repeat colonoscopy Referred to GI and Oncology

## 2021-11-23 NOTE — Assessment & Plan Note (Signed)
BP Readings from Last 1 Encounters:  11/23/21 138/80   Well-controlled with Losartan-HCTZ and Metoprolol Also takes Lasix for LE swelling, will address if she needs it further in the next visit Counseled for compliance with the medications Advised DASH diet and moderate exercise/walking, at least 150 mins/week

## 2021-11-23 NOTE — Assessment & Plan Note (Addendum)
HbA1C: 8.8, better than prior On Lantus 30 U QD and Metformin 1000 mg QD Uncontrolled currently, increased dose of metformin to 1000 mg twice daily Advised to follow diabetic diet On statin F/u CMP and lipid panel Diabetic eye exam: Advised to follow up with Ophthalmology for diabetic eye exam

## 2021-11-23 NOTE — Progress Notes (Signed)
Established Patient Office Visit  Subjective:  Patient ID: [name], female    DOB: 02/19/1966  Age: 56 y.o. MRN: [id number]  CC:  Chief Complaint  Patient presents with   Annual Exam    Annual exam     HPI [name] is a 56 y.o. female with past medical history of HTN, uncontrolled type 2 DM with neuropathy, sigmoid colon ca. S/p partial colectomy, schizoaffective disorder and obesity who presents for annual physical.  DM: HbA1C is 8.8 in 05/23. She states that she takes Lantus 30 U qHS and Metformin, but compliance is questionable. Denies any polyuria or polydipsia.  She has brought her glucometer, which shows blood glucose ranging from 130-250 mostly with some readings above 300.  HTN: BP is well-controlled. Takes medications regularly. Patient denies headache, dizziness, chest pain, dyspnea or palpitations.   She tried to schedule appointment with Psychiatry for follow up of Schizoaffective disorder, but was not able to schedule it. Does not appear to have hallucinations.   She has history of sigmoid cancer, but did not do colonoscopy, which was scheduled in 2022. She denies any melena or hematochezia currently.  She also did not continue oncology follow-up.   Past Medical History:  Diagnosis Date   Charcot's joint of foot, left    Depression    Diabetes mellitus without complication ([place])    Hyperlipidemia    Hypertension    Neuromuscular disorder ([place])    Schizo affective schizophrenia ([place])    abilify and pazil    Past Surgical History:  Procedure Laterality Date   CHOLECYSTECTOMY     COLONOSCOPY WITH PROPOFOL N/A 05/26/2019   Procedure: COLONOSCOPY WITH PROPOFOL;  Surgeon: Toledo, Benay Pike, MD;  Location: [facility] ENDOSCOPY;  Service: Gastroenterology;  Laterality: N/A;   ESOPHAGOGASTRODUODENOSCOPY (EGD) WITH PROPOFOL N/A 05/26/2019   Procedure: ESOPHAGOGASTRODUODENOSCOPY (EGD) WITH PROPOFOL;  Surgeon: Toledo, Benay Pike, MD;  Location: [facility]  ENDOSCOPY;  Service: Gastroenterology;  Laterality: N/A;   IRRIGATION AND DEBRIDEMENT FOOT Right 02/26/2016   Procedure: RIGHT 2ND TOE DEBRIDEMENT;  Surgeon: Samara Deist, DPM;  Location: [facility] ORS;  Service: Podiatry;  Laterality: Right;   IRRIGATION AND DEBRIDEMENT FOOT Left 12/13/2018   Procedure: IRRIGATION AND DEBRIDEMENT FOOT;  Surgeon: Caroline More, DPM;  Location: [facility] ORS;  Service: Podiatry;  Laterality: Left;    Family History  Problem Relation Age of Onset   Breast cancer Cousin        maternal cousin   Hypertension Mother    Hyperthyroidism Mother    Dementia Mother    Prostate cancer Father    Diabetes Maternal Grandmother    Hypertension Maternal Grandmother    Cancer Maternal Grandmother        unknown type   Diabetes Maternal Grandfather    Hypertension Maternal Grandfather    Diabetes Paternal Grandmother    Hypertension Paternal Grandmother    Diabetes Paternal Grandfather    Hypertension Paternal Grandfather     Social History   Socioeconomic History   Marital status: Divorced    Spouse name: Not on file   Number of children: 2   Years of education: Not on file   Highest education level: Not on file  Occupational History   Not on file  Tobacco Use   Smoking status: Every Day    Packs/day: 1.00    Years: 30.00    Total pack years: 30.00    Types: Cigarettes    Last attempt to quit: 05/02/2009    Years  since quitting: 12.5   Smokeless tobacco: Never  Vaping Use   Vaping Use: Never used  Substance and Sexual Activity   Alcohol use: No   Drug use: No   Sexual activity: Not Currently  Other Topics Concern   Not on file  Social History Narrative   At home with mom, 33. Lives in brothers house who moved out. Sister comes by every other day to check on patient and mom.   Social Determinants of Health   Financial Resource Strain: Low Risk  (11/04/2020)   Overall Financial Resource Strain (CARDIA)    Difficulty of Paying Living Expenses: Not hard at  all  Food Insecurity: No Food Insecurity (11/04/2020)   Hunger Vital Sign    Worried About Running Out of Food in the Last Year: Never true    Ran Out of Food in the Last Year: Never true  Transportation Needs: No Transportation Needs (11/04/2020)   PRAPARE - Hydrologist (Medical): No    Lack of Transportation (Non-Medical): No  Physical Activity: Inactive (11/04/2020)   Exercise Vital Sign    Days of Exercise per Week: 0 days    Minutes of Exercise per Session: 0 min  Stress: No Stress Concern Present (11/04/2020)   [place]    Feeling of Stress : Only a little  Social Connections: Socially Isolated (11/04/2020)   Social Connection and Isolation Panel [NHANES]    Frequency of Communication with Friends and Family: More than three times a week    Frequency of Social Gatherings with Friends and Family: More than three times a week    Attends Religious Services: Never    Marine scientist or Organizations: No    Attends Archivist Meetings: Never    Marital Status: Divorced  Human resources officer Violence: Not At Risk (11/04/2020)   Humiliation, Afraid, Rape, and Kick questionnaire    Fear of Current or Ex-Partner: No    Emotionally Abused: No    Physically Abused: No    Sexually Abused: No    Outpatient Medications Prior to Visit  Medication Sig Dispense Refill   ACCU-CHEK GUIDE test strip USE TO CHECK BLOOD SUGAR FOUR TIMES DAILY 100 strip 0   acetaminophen (TYLENOL) 325 MG tablet Take 2 tablets (650 mg total) by mouth every 6 (six) hours as needed for mild pain.     ARIPiprazole (ABILIFY) 15 MG tablet Take 1 tablet (15 mg total) by mouth daily. 30 tablet 10   atorvastatin (LIPITOR) 40 MG tablet Take 1 tablet (40 mg total) by mouth daily. 90 tablet 1   blood glucose meter kit and supplies Dispense based on patient and insurance preference. Use up to four times daily as directed. (FOR  ICD-10 E10.9, E11.9). 1 each 0   blood glucose meter kit and supplies Dispense based on patient and insurance preference. Use up to four times daily as directed. (FOR ICD-10 E10.9, E11.9). 1 each 0   buPROPion (WELLBUTRIN XL) 300 MG 24 hr tablet TAKE 1 TABLET BY MOUTH EVERY MORNING 30 tablet 10   colestipol (COLESTID) 1 g tablet TAKE TWO (2) TABLETS BY MOUTH TWICE DAILY 120 tablet 10   furosemide (LASIX) 40 MG tablet TAKE (1) TABLET BY MOUTH ONCE DAILY. 30 tablet 0   gabapentin (NEURONTIN) 300 MG capsule Take 1 capsule (300 mg total) by mouth at bedtime. (Patient taking differently: Take 300 mg by mouth daily.) 90 capsule 1  hydrochlorothiazide (HYDRODIURIL) 12.5 MG tablet Take 12.5 mg by mouth daily.     ibuprofen (ADVIL) 200 MG tablet Take 400 mg by mouth every 6 (six) hours as needed.     Lancets (ONETOUCH DELICA PLUS [id number]) MISC USE TO TEST 3 TIMES DAILY. 100 each 0   LANTUS SOLOSTAR 100 UNIT/ML Solostar Pen INJECT 30 UNITS INTO THE SKIN ONCE DAILY AT BEDTIME 15 mL 0   losartan-hydrochlorothiazide (HYZAAR) 50-12.5 MG tablet TAKE (1) TABLET BY MOUTH ONCE DAILY. 30 tablet 0   metoprolol succinate (TOPROL-XL) 50 MG 24 hr tablet Take 1 tablet (50 mg total) by mouth daily. Take with or immediately following a meal. 90 tablet 1   mupirocin ointment (BACTROBAN) 2 % APPLY TO AFFECTED AREA TWICE DAILY 22 g 10   ondansetron (ZOFRAN) 4 MG tablet Take 1 tablet (4 mg total) by mouth every 6 (six) hours. (Patient taking differently: Take 4 mg by mouth every 8 (eight) hours as needed for nausea or vomiting.) 12 tablet 0   pantoprazole (PROTONIX) 40 MG tablet TAKE (1) TABLET BY MOUTH ONCE DAILY. 30 tablet 0   polyethylene glycol-electrolytes (TRILYTE) 420 g solution Take 4,000 mLs by mouth as directed. 4000 mL 0   traZODone (DESYREL) 50 MG tablet Take 1 tablet (50 mg total) by mouth at bedtime as needed. for sleep 30 tablet 3   Continuous Blood Gluc Receiver (DEXCOM G7 RECEIVER) DEVI Check blood glucose  from sensor with receiver. 1 each 0   Continuous Blood Gluc Sensor (DEXCOM G7 SENSOR) MISC Please use it to check blood glucose 3 times before meals, at bedtime and as needed. 3 each 11   metFORMIN (GLUCOPHAGE) 1000 MG tablet Take 1 tablet (1,000 mg total) by mouth daily with breakfast. 90 tablet 1   No facility-administered medications prior to visit.    No Known Allergies  ROS Review of Systems  Constitutional:  Negative for chills and fever.  HENT:  Negative for congestion, sinus pressure, sinus pain and sore throat.   Eyes:  Negative for pain and discharge.  Respiratory:  Negative for cough and shortness of breath.   Cardiovascular:  Negative for chest pain and palpitations.  Gastrointestinal:  Negative for abdominal pain, constipation, nausea and vomiting.  Endocrine: Negative for polydipsia and polyuria.  Genitourinary:  Negative for dysuria and hematuria.  Musculoskeletal:  Positive for arthralgias. Negative for neck pain and neck stiffness.  Skin:  Negative for rash.  Neurological:  Negative for dizziness and weakness.  Psychiatric/Behavioral:  Negative for agitation and behavioral problems. The patient is nervous/anxious.       Objective:    Physical Exam Vitals reviewed.  Constitutional:      General: She is not in acute distress.    Appearance: She is obese. She is not diaphoretic.     Comments: Uses walker  HENT:     Head: Normocephalic and atraumatic.     Nose: Nose normal.     Mouth/Throat:     Mouth: Mucous membranes are moist.  Eyes:     General: No scleral icterus.    Extraocular Movements: Extraocular movements intact.     Pupils: Pupils are equal, round, and reactive to light.  Cardiovascular:     Rate and Rhythm: Normal rate and regular rhythm.     Pulses: Normal pulses.     Heart sounds: Normal heart sounds. No murmur heard. Pulmonary:     Breath sounds: Normal breath sounds. No wheezing or rales.  Abdominal:     Palpations:  Abdomen is soft.      Tenderness: There is no abdominal tenderness.  Musculoskeletal:     Cervical back: Neck supple. No tenderness.     Right lower leg: No edema.     Left lower leg: No edema.  Skin:    General: Skin is warm.     Findings: No rash.  Neurological:     General: No focal deficit present.     Mental Status: She is alert and oriented to person, place, and time.     Sensory: Sensory deficit (B/l LE) present.     Motor: Weakness (3/5 - b/l LE) present.  Psychiatric:        Mood and Affect: Mood normal.        Behavior: Behavior normal.     BP 138/80 (BP Location: Left Arm, Patient Position: Sitting, Cuff Size: Normal)   Pulse 78   Resp 16   Ht 5' 10.5" (1.791 m)   Wt 261 lb (118.4 kg)   SpO2 98%   BMI 36.92 kg/m  Wt Readings from Last 3 Encounters:  11/23/21 261 lb (118.4 kg)  09/14/21 254 lb (115.2 kg)  03/25/21 285 lb (129.3 kg)    Lab Results  Component Value Date   TSH 2.00 11/07/2017   Lab Results  Component Value Date   WBC 12.5 (H) 03/25/2021   HGB 10.5 (L) 03/25/2021   HCT 32.8 (L) 03/25/2021   MCV 92.4 03/25/2021   PLT 203 03/25/2021   Lab Results  Component Value Date   NA 132 (L) 03/25/2021   K 5.1 03/25/2021   CO2 22 03/25/2021   GLUCOSE 226 (H) 03/25/2021   BUN 33 (H) 03/25/2021   CREATININE 2.35 (H) 03/25/2021   BILITOT 0.6 03/25/2021   ALKPHOS 153 (H) 03/25/2021   AST 18 03/25/2021   ALT 19 03/25/2021   PROT 7.2 03/25/2021   ALBUMIN 3.4 (L) 03/25/2021   CALCIUM 8.8 (L) 03/25/2021   ANIONGAP 9 03/25/2021   Lab Results  Component Value Date   CHOL 175 02/23/2020   Lab Results  Component Value Date   HDL 42 02/23/2020   Lab Results  Component Value Date   LDLCALC 78 02/23/2020   Lab Results  Component Value Date   TRIG 275 (H) 02/23/2020   Lab Results  Component Value Date   CHOLHDL 4.2 02/23/2020   Lab Results  Component Value Date   HGBA1C 8.8 09/14/2021      Assessment & Plan:   Problem List Items Addressed This Visit        Cardiovascular and Mediastinum   Essential hypertension (Chronic)    BP Readings from Last 1 Encounters:  11/23/21 138/80  Well-controlled with Losartan-HCTZ and Metoprolol Also takes Lasix for LE swelling, will address if she needs it further in the next visit Counseled for compliance with the medications Advised DASH diet and moderate exercise/walking, at least 150 mins/week      Relevant Orders   TSH   CBC with Differential/Platelet     Digestive   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[place])    status post partial colectomy in January 2021 Needs repeat colonoscopy Referred to GI and Oncology      Relevant Orders   Ambulatory referral to Hematology / Oncology   CBC with Differential/Platelet     Endocrine   Type 2 diabetes mellitus with neurological complications ([place])    NWG9F: 8.8, better than prior On Lantus 30 U QD and Metformin 1000 mg QD Uncontrolled currently,  increased dose of metformin to 1000 mg twice daily Advised to follow diabetic diet On statin F/u CMP and lipid panel Diabetic eye exam: Advised to follow up with Ophthalmology for diabetic eye exam      Relevant Medications   metFORMIN (GLUCOPHAGE) 1000 MG tablet   Continuous Blood Gluc Sensor (DEXCOM G7 SENSOR) MISC   Continuous Blood Gluc Receiver (DEXCOM G7 RECEIVER) DEVI   Other Relevant Orders   Hemoglobin A1c   CMP14+EGFR   Hyperlipidemia associated with type 2 diabetes mellitus (HCC)    On Lipitor Check lipid profile      Relevant Medications   metFORMIN (GLUCOPHAGE) 1000 MG tablet   Other Relevant Orders   Lipid panel     Other   Schizoaffective disorder ([place])    On Abilify and Wellbutrin, although unclear whether she is taking them On Trazodone Was followed by Psychiatry, lost to follow up, referred to [facility] - needs to have follow up visit and clarification of medications      Relevant Orders   Ambulatory referral to Psychiatry   Morbid obesity ([place])    BMI Readings from  Last 2 Encounters:  11/23/21 36.92 kg/m  09/14/21 36.45 kg/m  Associated with type II DM, HLD, HTN and GERD Diet modification and moderate exercise advised       Relevant Medications   metFORMIN (GLUCOPHAGE) 1000 MG tablet   Encounter for general adult medical examination with abnormal findings - Primary    Physical exam as documented. Counseling done  re healthy lifestyle involving commitment to 150 minutes exercise per week, heart healthy diet, and attaining healthy weight.The importance of adequate sleep also discussed. Changes in health habits are decided on by the patient with goals and time frames  set for achieving them. Immunization and cancer screening needs are specifically addressed at this visit.      Relevant Orders   TSH   CBC with Differential/Platelet   Other Visit Diagnoses     Vitamin D deficiency       Relevant Orders   VITAMIN D 25 Hydroxy (Vit-D Deficiency, Fractures)       Meds ordered this encounter  Medications   DISCONTD: metFORMIN (GLUCOPHAGE) 1000 MG tablet    Sig: Take 1 tablet (1,000 mg total) by mouth daily with breakfast.    Dispense:  180 tablet    Refill:  1   metFORMIN (GLUCOPHAGE) 1000 MG tablet    Sig: Take 1 tablet (1,000 mg total) by mouth 2 (two) times daily with a meal.    Dispense:  180 tablet    Refill:  1    Please cancel QD dose prescription.   Continuous Blood Gluc Sensor (DEXCOM G7 SENSOR) MISC    Sig: Please use it to check blood glucose 3 times before meals, at bedtime and as needed.    Dispense:  3 each    Refill:  11   Continuous Blood Gluc Receiver (DEXCOM G7 RECEIVER) DEVI    Sig: Check blood glucose from sensor with receiver.    Dispense:  1 each    Refill:  0    Follow-up: Return in about 3 months (around 02/23/2022) for DM.    Lindell Spar, MD

## 2021-11-23 NOTE — Telephone Encounter (Signed)
Patient called and said send to [facility] on [address]

## 2021-11-23 NOTE — Assessment & Plan Note (Signed)
BMI Readings from Last 2 Encounters:  11/23/21 36.92 kg/m  09/14/21 36.45 kg/m   Associated with type II DM, HLD, HTN and GERD Diet modification and moderate exercise advised

## 2021-11-23 NOTE — Patient Instructions (Signed)
Please take Metformin twice daily instead of once daily.  Please continue taking other medications as prescribed.  Please continue to follow low carb diet and ambulate as tolerated.

## 2021-11-23 NOTE — Assessment & Plan Note (Signed)

## 2021-11-23 NOTE — Telephone Encounter (Signed)
Please have patient call her insurance and see which pharmacy her insurance will cover this through and we will send there

## 2021-11-23 NOTE — Telephone Encounter (Signed)
FYI, [facility] will be faxing over the Dexcom they do not accept patient insurance [employer] for this, need to send to another pharmacy.     Continuous Blood Gluc Receiver ([place]) [facility] [[id number]]   Continuous Blood Gluc Sensor ([place]) [facility] [[id number]]   Pharmacy: [facility]

## 2021-11-23 NOTE — Telephone Encounter (Signed)
Resent to [facility]

## 2021-11-23 NOTE — Assessment & Plan Note (Signed)
On Abilify and Wellbutrin, although unclear whether she is taking them On Trazodone Was followed by Psychiatry, lost to follow up, referred to [place] clinic - needs to have follow up visit and clarification of medications 

## 2021-11-25 LAB — CBC WITH DIFFERENTIAL/PLATELET
Basophils Absolute: 0.1 10*3/uL (ref 0.0–0.2)
Basos: 1 %
EOS (ABSOLUTE): 0.3 10*3/uL (ref 0.0–0.4)
Eos: 3 %
Hematocrit: 32.6 % — ABNORMAL LOW (ref 34.0–46.6)
Hemoglobin: 10.6 g/dL — ABNORMAL LOW (ref 11.1–15.9)
Immature Grans (Abs): 0 10*3/uL (ref 0.0–0.1)
Immature Granulocytes: 0 %
Lymphocytes Absolute: 3.9 10*3/uL — ABNORMAL HIGH (ref 0.7–3.1)
Lymphs: 33 %
MCH: 29.9 pg (ref 26.6–33.0)
MCHC: 32.5 g/dL (ref 31.5–35.7)
MCV: 92 fL (ref 79–97)
Monocytes Absolute: 0.6 10*3/uL (ref 0.1–0.9)
Monocytes: 5 %
Neutrophils Absolute: 6.9 10*3/uL (ref 1.4–7.0)
Neutrophils: 58 %
Platelets: 276 10*3/uL (ref 150–450)
RBC: 3.54 x10E6/uL — ABNORMAL LOW (ref 3.77–5.28)
RDW: 14.4 % (ref 11.7–15.4)
WBC: 11.8 10*3/uL — ABNORMAL HIGH (ref 3.4–10.8)

## 2021-11-25 LAB — CMP14+EGFR
ALT: 25 IU/L (ref 0–32)
AST: 20 IU/L (ref 0–40)
Albumin/Globulin Ratio: 1.7 (ref 1.2–2.2)
Albumin: 4.1 g/dL (ref 3.8–4.9)
Alkaline Phosphatase: 174 IU/L — ABNORMAL HIGH (ref 44–121)
BUN/Creatinine Ratio: 12 (ref 9–23)
BUN: 23 mg/dL (ref 6–24)
Bilirubin Total: <0.2 mg/dL (ref 0.0–1.2)
CO2: 20 mmol/L (ref 20–29)
Calcium: 9.7 mg/dL (ref 8.7–10.2)
Chloride: 102 mmol/L (ref 96–106)
Creatinine, Ser: 1.88 mg/dL — ABNORMAL HIGH (ref 0.57–1.00)
Globulin, Total: 2.4 g/dL (ref 1.5–4.5)
Glucose: 147 mg/dL — ABNORMAL HIGH (ref 70–99)
Potassium: 5.5 mmol/L — ABNORMAL HIGH (ref 3.5–5.2)
Sodium: 136 mmol/L (ref 134–144)
Total Protein: 6.5 g/dL (ref 6.0–8.5)
eGFR: 31 mL/min/{1.73_m2} — ABNORMAL LOW (ref >59)

## 2021-11-25 LAB — LIPID PANEL
Chol/HDL Ratio: 4.2 ratio (ref 0.0–4.4)
Cholesterol, Total: 168 mg/dL (ref 100–199)
HDL: 40 mg/dL (ref >39)
LDL Chol Calc (NIH): 77 mg/dL (ref 0–99)
Triglycerides: 313 mg/dL — ABNORMAL HIGH (ref 0–149)
VLDL Cholesterol Cal: 51 mg/dL — ABNORMAL HIGH (ref 5–40)

## 2021-11-25 LAB — VITAMIN D 25 HYDROXY (VIT D DEFICIENCY, FRACTURES): Vit D, 25-Hydroxy: 15.9 ng/mL — ABNORMAL LOW (ref 30.0–100.0)

## 2021-11-25 LAB — TSH: TSH: 2.14 u[IU]/mL (ref 0.450–4.500)

## 2021-12-02 ENCOUNTER — Other Ambulatory Visit: Payer: Self-pay | Admitting: Internal Medicine

## 2021-12-03 ENCOUNTER — Encounter: Payer: Self-pay | Admitting: Oncology

## 2021-12-06 ENCOUNTER — Other Ambulatory Visit: Payer: Self-pay | Admitting: Internal Medicine

## 2021-12-06 DIAGNOSIS — I10 Essential (primary) hypertension: Secondary | ICD-10-CM

## 2021-12-06 DIAGNOSIS — E1142 Type 2 diabetes mellitus with diabetic polyneuropathy: Secondary | ICD-10-CM

## 2021-12-08 ENCOUNTER — Inpatient Hospital Stay: Payer: Medicare Other

## 2021-12-08 ENCOUNTER — Other Ambulatory Visit (HOSPITAL_COMMUNITY): Admission: RE | Admit: 2021-12-08 | Payer: Medicare Other | Source: Ambulatory Visit

## 2021-12-08 ENCOUNTER — Other Ambulatory Visit (HOSPITAL_COMMUNITY): Admit: 2021-12-08 | Payer: Medicare Other

## 2021-12-08 ENCOUNTER — Encounter: Payer: Self-pay | Admitting: Hematology

## 2021-12-08 ENCOUNTER — Inpatient Hospital Stay: Payer: Medicare Other | Attending: Hematology | Admitting: Hematology

## 2021-12-08 VITALS — BP 130/78 | HR 70 | Temp 97.4°F | Resp 18 | Ht 70.0 in | Wt 254.3 lb

## 2021-12-08 DIAGNOSIS — Z8042 Family history of malignant neoplasm of prostate: Secondary | ICD-10-CM | POA: Diagnosis not present

## 2021-12-08 DIAGNOSIS — I129 Hypertensive chronic kidney disease with stage 1 through stage 4 chronic kidney disease, or unspecified chronic kidney disease: Secondary | ICD-10-CM | POA: Insufficient documentation

## 2021-12-08 DIAGNOSIS — Z803 Family history of malignant neoplasm of breast: Secondary | ICD-10-CM | POA: Insufficient documentation

## 2021-12-08 DIAGNOSIS — C187 Malignant neoplasm of sigmoid colon: Secondary | ICD-10-CM

## 2021-12-08 DIAGNOSIS — N189 Chronic kidney disease, unspecified: Secondary | ICD-10-CM | POA: Diagnosis not present

## 2021-12-08 DIAGNOSIS — E1122 Type 2 diabetes mellitus with diabetic chronic kidney disease: Secondary | ICD-10-CM | POA: Insufficient documentation

## 2021-12-08 DIAGNOSIS — F17210 Nicotine dependence, cigarettes, uncomplicated: Secondary | ICD-10-CM | POA: Insufficient documentation

## 2021-12-08 DIAGNOSIS — D509 Iron deficiency anemia, unspecified: Secondary | ICD-10-CM

## 2021-12-08 LAB — IRON AND TIBC
Iron: 34 ug/dL (ref 28–170)
Saturation Ratios: 11 % (ref 10.4–31.8)
TIBC: 302 ug/dL (ref 250–450)
UIBC: 268 ug/dL

## 2021-12-08 LAB — FOLATE: Folate: 6 ng/mL (ref >5.9)

## 2021-12-08 LAB — FERRITIN: Ferritin: 61 ng/mL (ref 11–307)

## 2021-12-08 LAB — VITAMIN B12: Vitamin B-12: 248 pg/mL (ref 180–914)

## 2021-12-08 NOTE — Progress Notes (Signed)
[facility] NOTE  Patient Care Team: Lindell Spar, MD as PCP - General (Internal Medicine) Floria Raveling, MD as Referring Physician (Psychiatry) Edrick Kins, MD as Rounding Team (Internal Medicine) Lloyd Huger, MD as Consulting Physician (Oncology) Ethelda Chick as Social Worker Oletta Lamas, Garwin Brothers, RN as Oncology Nurse Navigator (Oncology) Derek Jack, MD as Medical Oncologist (Medical Oncology)  CHIEF COMPLAINTS/PURPOSE OF CONSULTATION:  Sigmoid colon cancer and iron deficiency state.  HISTORY OF PRESENTING ILLNESS:  [name] 56 y.o. female is seen in consultation today at the request of Dr. Posey Pronto for further management of sigmoid colon cancer as well as iron deficiency anemia.  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  She was evaluated by [name] at [facility], last time in April 2021.  Since then she was lost to follow-up.  She denies any bleeding per rectum or melena.  She has 4-5 runny stools per day since surgery which is stable.  Denies any fevers, night sweats or weight loss in the last 6 months.  She uses a walker for ambulation because of left Charcot's foot.  She lives by herself and is independent of ADLs and IADLs.  Family history is significant for maternal grandmother with cancer.  MEDICAL HISTORY:  Past Medical History:  Diagnosis Date   Charcot's joint of foot, left    Depression    Diabetes mellitus without complication ([place])    Hyperlipidemia    Hypertension    Neuromuscular disorder ([place])    Schizo affective schizophrenia ([place])    abilify and pazil    SURGICAL HISTORY: Past Surgical History:  Procedure Laterality Date   CHOLECYSTECTOMY     COLONOSCOPY WITH PROPOFOL N/A 05/26/2019   Procedure: COLONOSCOPY WITH PROPOFOL;  Surgeon: Toledo, Benay Pike, MD;  Location: [facility] ENDOSCOPY;  Service: Gastroenterology;   Laterality: N/A;   ESOPHAGOGASTRODUODENOSCOPY (EGD) WITH PROPOFOL N/A 05/26/2019   Procedure: ESOPHAGOGASTRODUODENOSCOPY (EGD) WITH PROPOFOL;  Surgeon: Toledo, Benay Pike, MD;  Location: [facility] ENDOSCOPY;  Service: Gastroenterology;  Laterality: N/A;   IRRIGATION AND DEBRIDEMENT FOOT Right 02/26/2016   Procedure: RIGHT 2ND TOE DEBRIDEMENT;  Surgeon: Samara Deist, DPM;  Location: [facility] ORS;  Service: Podiatry;  Laterality: Right;   IRRIGATION AND DEBRIDEMENT FOOT Left 12/13/2018   Procedure: IRRIGATION AND DEBRIDEMENT FOOT;  Surgeon: Caroline More, DPM;  Location: [facility] ORS;  Service: Podiatry;  Laterality: Left;    SOCIAL HISTORY: Social History   Socioeconomic History   Marital status: Divorced    Spouse name: Not on file   Number of children: 2   Years of education: Not on file   Highest education level: Not on file  Occupational History   Not on file  Tobacco Use   Smoking status: Every Day    Packs/day: 1.00    Years: 30.00    Total pack years: 30.00    Types: Cigarettes    Last attempt to quit: 05/02/2009    Years since quitting: 12.6   Smokeless tobacco: Never  Vaping Use   Vaping Use: Never used  Substance and Sexual Activity   Alcohol use: No   Drug use: No   Sexual activity: Not Currently  Other Topics Concern   Not on file  Social History Narrative   At home with mom, 10. Lives in brothers house who moved out. Sister comes by every other day to check on patient and mom.   Social Determinants of  Health   Financial Resource Strain: Low Risk  (11/04/2020)   Overall Financial Resource Strain (CARDIA)    Difficulty of Paying Living Expenses: Not hard at all  Food Insecurity: No Food Insecurity (11/04/2020)   Hunger Vital Sign    Worried About Running Out of Food in the Last Year: Never true    Ran Out of Food in the Last Year: Never true  Transportation Needs: No Transportation Needs (11/04/2020)   PRAPARE - Hydrologist (Medical): No    Lack of  Transportation (Non-Medical): No  Physical Activity: Inactive (11/04/2020)   Exercise Vital Sign    Days of Exercise per Week: 0 days    Minutes of Exercise per Session: 0 min  Stress: No Stress Concern Present (11/04/2020)   [place]    Feeling of Stress : Only a little  Social Connections: Socially Isolated (11/04/2020)   Social Connection and Isolation Panel [NHANES]    Frequency of Communication with Friends and Family: More than three times a week    Frequency of Social Gatherings with Friends and Family: More than three times a week    Attends Religious Services: Never    Marine scientist or Organizations: No    Attends Archivist Meetings: Never    Marital Status: Divorced  Human resources officer Violence: Not At Risk (11/04/2020)   Humiliation, Afraid, Rape, and Kick questionnaire    Fear of Current or Ex-Partner: No    Emotionally Abused: No    Physically Abused: No    Sexually Abused: No    FAMILY HISTORY: Family History  Problem Relation Age of Onset   Breast cancer Cousin        maternal cousin   Hypertension Mother    Hyperthyroidism Mother    Dementia Mother    Prostate cancer Father    Diabetes Maternal Grandmother    Hypertension Maternal Grandmother    Cancer Maternal Grandmother        unknown type   Diabetes Maternal Grandfather    Hypertension Maternal Grandfather    Diabetes Paternal Grandmother    Hypertension Paternal Grandmother    Diabetes Paternal Grandfather    Hypertension Paternal Grandfather     ALLERGIES:  has No Known Allergies.  MEDICATIONS:  Current Outpatient Medications  Medication Sig Dispense Refill   ACCU-CHEK GUIDE test strip USE TO CHECK BLOOD SUGAR FOUR TIMES DAILY 100 strip 0   acetaminophen (TYLENOL) 325 MG tablet Take 2 tablets (650 mg total) by mouth every 6 (six) hours as needed for mild pain.     ARIPiprazole (ABILIFY) 15 MG tablet Take 1 tablet (15  mg total) by mouth daily. 30 tablet 10   atorvastatin (LIPITOR) 40 MG tablet Take 1 tablet (40 mg total) by mouth daily. 90 tablet 1   blood glucose meter kit and supplies Dispense based on patient and insurance preference. Use up to four times daily as directed. (FOR ICD-10 E10.9, E11.9). 1 each 0   blood glucose meter kit and supplies Dispense based on patient and insurance preference. Use up to four times daily as directed. (FOR ICD-10 E10.9, E11.9). 1 each 0   buPROPion (WELLBUTRIN XL) 300 MG 24 hr tablet TAKE 1 TABLET BY MOUTH EVERY MORNING 30 tablet 10   colestipol (COLESTID) 1 g tablet TAKE TWO (2) TABLETS BY MOUTH TWICE DAILY 120 tablet 10   Continuous Blood Gluc Receiver (DEXCOM G7 RECEIVER) DEVI Check blood  glucose from sensor with receiver. 1 each 0   Continuous Blood Gluc Sensor (DEXCOM G7 SENSOR) MISC Please use it to check blood glucose 3 times before meals, at bedtime and as needed. 3 each 11   furosemide (LASIX) 40 MG tablet TAKE (1) TABLET BY MOUTH ONCE DAILY. 30 tablet 0   gabapentin (NEURONTIN) 300 MG capsule Take 1 capsule (300 mg total) by mouth at bedtime. (Patient taking differently: Take 300 mg by mouth daily.) 90 capsule 1   hydrochlorothiazide (HYDRODIURIL) 12.5 MG tablet Take 12.5 mg by mouth daily.     ibuprofen (ADVIL) 200 MG tablet Take 400 mg by mouth every 6 (six) hours as needed.     Lancets (ONETOUCH DELICA PLUS [id number]) MISC USE TO TEST 3 TIMES DAILY. 100 each 0   LANTUS SOLOSTAR 100 UNIT/ML Solostar Pen INJECT 30 UNITS INTO THE SKIN ONCE DAILY AT BEDTIME 15 mL 0   losartan-hydrochlorothiazide (HYZAAR) 50-12.5 MG tablet TAKE (1) TABLET BY MOUTH ONCE DAILY. 30 tablet 0   metFORMIN (GLUCOPHAGE) 1000 MG tablet Take 1 tablet (1,000 mg total) by mouth 2 (two) times daily with a meal. 180 tablet 1   metoprolol succinate (TOPROL-XL) 50 MG 24 hr tablet Take 1 tablet (50 mg total) by mouth daily. Take with or immediately following a meal. 90 tablet 1   mupirocin ointment  (BACTROBAN) 2 % APPLY TO AFFECTED AREA TWICE DAILY 22 g 10   pantoprazole (PROTONIX) 40 MG tablet TAKE (1) TABLET BY MOUTH ONCE DAILY. 30 tablet 0   polyethylene glycol-electrolytes (TRILYTE) 420 g solution Take 4,000 mLs by mouth as directed. 4000 mL 0   traZODone (DESYREL) 50 MG tablet Take 1 tablet (50 mg total) by mouth at bedtime as needed. for sleep 30 tablet 3   ondansetron (ZOFRAN) 4 MG tablet Take 1 tablet (4 mg total) by mouth every 6 (six) hours. (Patient not taking: Reported on 12/08/2021) 12 tablet 0   No current facility-administered medications for this visit.    REVIEW OF SYSTEMS:   Constitutional: Denies fevers, chills or abnormal night sweats Eyes: Denies blurriness of vision, double vision or watery eyes Ears, nose, mouth, throat, and face: Denies mucositis or sore throat Respiratory: Chronic cough and shortness of breath on exertion is stable. Cardiovascular: Denies palpitation, chest discomfort or lower extremity swelling Gastrointestinal: Chronic diarrhea since surgery stable. Skin: Denies abnormal skin rashes Lymphatics: Denies new lymphadenopathy or easy bruising Neurological: Numbness in the feet is stable. Behavioral/Psych: Mood is stable, no new changes  All other systems were reviewed with the patient and are negative.  PHYSICAL EXAMINATION: ECOG PERFORMANCE STATUS: 1 - Symptomatic but completely ambulatory  Vitals:   12/08/21 1331  BP: 130/78  Pulse: 70  Resp: 18  Temp: (!) 97.4 F (36.3 C)  SpO2: 98%   Filed Weights   12/08/21 1331  Weight: 254 lb 4.8 oz (115.3 kg)    GENERAL:alert, no distress and comfortable SKIN: skin color, texture, turgor are normal, no rashes or significant lesions EYES: normal, conjunctiva are pink and non-injected, sclera clear OROPHARYNX:no exudate, no erythema and lips, buccal mucosa, and tongue normal  NECK: supple, thyroid normal size, non-tender, without nodularity LYMPH:  no palpable lymphadenopathy in the  cervical, axillary or inguinal LUNGS: clear to auscultation and percussion with normal breathing effort HEART: regular rate & rhythm and no murmurs and no lower extremity edema ABDOMEN:abdomen soft, non-tender and normal bowel sounds Musculoskeletal:no cyanosis of digits and no clubbing  PSYCH: alert & oriented x 3 with  fluent speech NEURO: no focal motor/sensory deficits  LABORATORY DATA:  I have reviewed the data as listed Lab Results  Component Value Date   WBC 11.8 (H) 11/23/2021   HGB 10.6 (L) 11/23/2021   HCT 32.6 (L) 11/23/2021   MCV 92 11/23/2021   PLT 276 11/23/2021     Chemistry      Component Value Date/Time   NA 136 11/23/2021 1338   NA 133 (L) 08/19/2012 1224   K 5.5 (H) 11/23/2021 1338   K 3.1 (L) 08/19/2012 1224   CL 102 11/23/2021 1338   CL 99 08/19/2012 1224   CO2 20 11/23/2021 1338   CO2 23 08/19/2012 1224   BUN 23 11/23/2021 1338   BUN 15 08/19/2012 1224   CREATININE 1.88 (H) 11/23/2021 1338   CREATININE 1.30 (H) 11/25/2019 1209      Component Value Date/Time   CALCIUM 9.7 11/23/2021 1338   CALCIUM 8.9 08/19/2012 1224   ALKPHOS 174 (H) 11/23/2021 1338   ALKPHOS 113 08/19/2012 1224   AST 20 11/23/2021 1338   AST 30 08/19/2012 1224   ALT 25 11/23/2021 1338   ALT 45 08/19/2012 1224   BILITOT <0.2 11/23/2021 1338   BILITOT 0.5 08/19/2012 1224       RADIOGRAPHIC STUDIES: I have personally reviewed the radiological images as listed and agreed with the findings in the report. No results found.  ASSESSMENT:  1.  Stage I (pT2 pN0) sigmoid colon adenocarcinoma, MMR preserved: - Sigmoid colectomy (05/26/2019): Moderately differentiated adenocarcinoma, no perforation, margins negative, no lymphovascular/perineural invasion, 0/24 lymph nodes involved, pT2 pN0.  MMR preserved.  2.  Social/family history: - She lives at home by herself.  Uses walker due to left Charcot's foot.  Worked as a Doctor, hospital work.  Current active smoker, 1 pack/day  started back in January 2023.  Prior to that she quit for 11 years and smoked 1 pack/day for 10 years. - Maternal grandmother had cancer.  PLAN:  1.  Stage I (pT2 pN0) sigmoid colon adenocarcinoma, MMR preserved: - She was lost to follow-up after April 2021. - I have recommended colonoscopy as soon as possible. - Will check CEA level.  Will also do a CT scan of the abdomen and pelvis. - RTC Plastibell.  2.  Normocytic anemia: - Combination anemia from CKD and relative iron deficiency. - Will check ferritin, iron panel, D92 and folic acid levels. - Depending on the levels, I will start her on oral iron therapy.  She seems to have tolerated it well in the past.  If they are severely low, will consider parenteral iron therapy.   Orders Placed This Encounter  Procedures   CT Abdomen Pelvis W Contrast    Standing Status:   Future    Standing Expiration Date:   12/08/2022    Order Specific Question:   If indicated for the ordered procedure, I authorize the administration of contrast media per Radiology protocol    Answer:   Yes    Order Specific Question:   Is patient pregnant?    Answer:   No    Order Specific Question:   Preferred imaging location?    Answer:   [facility]    Order Specific Question:   Release to patient    Answer:   Immediate    Order Specific Question:   Is Oral Contrast requested for this exam?    Answer:   Yes, Per Radiology protocol   CEA    Standing  Status:   Future    Number of Occurrences:   1    Standing Expiration Date:   12/08/2022   Iron and TIBC ([facility] ONLY)    Standing Status:   Future    Number of Occurrences:   1    Standing Expiration Date:   12/09/2022   Ferritin    Standing Status:   Future    Number of Occurrences:   1    Standing Expiration Date:   12/08/2022   Vitamin B12    Standing Status:   Future    Number of Occurrences:   1    Standing Expiration Date:   12/08/2022   Folate    Standing Status:   Future     Number of Occurrences:   1    Standing Expiration Date:   12/08/2022    All questions were answered. The patient knows to call the clinic with any problems, questions or concerns.     Derek Jack, MD 12/08/2021 4:33 PM

## 2021-12-08 NOTE — Patient Instructions (Signed)
[place] at [facility] Discharge Instructions  You were seen and examined today by Dr. Delton Coombes. Dr. Delton Coombes is a medical oncologist, meaning that he specializes in the treatment of cancer diagnoses. Dr. Delton Coombes discussed your past medical history, family history of cancers, and the events that led to you being here today.  You were referred to Dr. Delton Coombes due to a history of colon cancer. Dr. Delton Coombes has recommended a repeat colonoscopy.  Dr. Delton Coombes has also ordered additional labs today as well as a CT scan of your abdomen and pelvis as scheduled.  Follow-up as scheduled.  Thank you for choosing [place] at [facility] to provide your oncology and hematology care.  To afford each patient quality time with our provider, please arrive at least 15 minutes before your scheduled appointment time.   If you have a lab appointment with the [facility] please come in thru the Main Entrance and check in at the main information desk.  You need to re-schedule your appointment should you arrive 10 or more minutes late.  We strive to give you quality time with our providers, and arriving late affects you and other patients whose appointments are after yours.  Also, if you no show three or more times for appointments you may be dismissed from the clinic at the providers discretion.     Again, thank you for choosing [facility].  Our hope is that these requests will decrease the amount of time that you wait before being seen by our physicians.       _____________________________________________________________  Should you have questions after your visit to [facility], please contact our office at [phone] and follow the prompts.  Our office hours are 8:00 a.m. and 4:30 p.m. Monday - Friday.  Please note that voicemails left after 4:00 p.m. may not be returned until the following business day.  We are closed  weekends and major holidays.  You do have access to a nurse 24-7, just call the main number to the clinic [phone] and do not press any options, hold on the line and a nurse will answer the phone.    For prescription refill requests, have your pharmacy contact our office and allow 72 hours.

## 2021-12-09 LAB — CEA: CEA: 1.6 ng/mL (ref 0.0–4.7)

## 2021-12-16 DIAGNOSIS — H3582 Retinal ischemia: Secondary | ICD-10-CM | POA: Diagnosis not present

## 2021-12-16 DIAGNOSIS — E113412 Type 2 diabetes mellitus with severe nonproliferative diabetic retinopathy with macular edema, left eye: Secondary | ICD-10-CM | POA: Diagnosis not present

## 2021-12-16 DIAGNOSIS — H35033 Hypertensive retinopathy, bilateral: Secondary | ICD-10-CM | POA: Diagnosis not present

## 2021-12-16 DIAGNOSIS — E113511 Type 2 diabetes mellitus with proliferative diabetic retinopathy with macular edema, right eye: Secondary | ICD-10-CM | POA: Diagnosis not present

## 2021-12-22 ENCOUNTER — Ambulatory Visit (HOSPITAL_COMMUNITY): Payer: Medicare Other

## 2021-12-30 ENCOUNTER — Ambulatory Visit: Payer: Medicare Other | Admitting: Hematology

## 2022-01-05 ENCOUNTER — Other Ambulatory Visit: Payer: Self-pay | Admitting: Internal Medicine

## 2022-01-05 DIAGNOSIS — E1142 Type 2 diabetes mellitus with diabetic polyneuropathy: Secondary | ICD-10-CM

## 2022-01-05 DIAGNOSIS — I10 Essential (primary) hypertension: Secondary | ICD-10-CM

## 2022-01-06 ENCOUNTER — Other Ambulatory Visit: Payer: Self-pay | Admitting: Internal Medicine

## 2022-01-11 ENCOUNTER — Ambulatory Visit: Payer: Medicare Other | Admitting: Internal Medicine

## 2022-01-11 ENCOUNTER — Telehealth: Payer: Self-pay

## 2022-01-11 NOTE — Telephone Encounter (Signed)
Called patient , she said yes and thinks she might have a tape worm. Patient been eating a lot and stool does not look right.

## 2022-01-11 NOTE — Telephone Encounter (Signed)
Patient needs appt to discuss with provider can be virtual please schedule

## 2022-01-11 NOTE — Telephone Encounter (Signed)
Please find out if this is for colonoscopy?

## 2022-01-11 NOTE — Telephone Encounter (Signed)
Need the referral for Gastro to Dr Felipe Drone office she can walk from her house to office. Call back # [phone]

## 2022-01-12 NOTE — Telephone Encounter (Signed)
Called patient left voicemail to contact our office.

## 2022-01-15 ENCOUNTER — Encounter (HOSPITAL_COMMUNITY): Payer: Self-pay | Admitting: *Deleted

## 2022-01-15 ENCOUNTER — Other Ambulatory Visit: Payer: Self-pay

## 2022-01-15 ENCOUNTER — Inpatient Hospital Stay (HOSPITAL_COMMUNITY)
Admission: EM | Admit: 2022-01-15 | Discharge: 2022-01-18 | DRG: 684 | Disposition: A | Discharge status: Home / Self Care | Payer: Medicare Other | Attending: Family Medicine | Admitting: Family Medicine

## 2022-01-15 DIAGNOSIS — R600 Localized edema: Secondary | ICD-10-CM | POA: Diagnosis not present

## 2022-01-15 DIAGNOSIS — I10 Essential (primary) hypertension: Secondary | ICD-10-CM | POA: Diagnosis not present

## 2022-01-15 DIAGNOSIS — E1149 Type 2 diabetes mellitus with other diabetic neurological complication: Secondary | ICD-10-CM | POA: Diagnosis present

## 2022-01-15 DIAGNOSIS — E875 Hyperkalemia: Secondary | ICD-10-CM | POA: Diagnosis present

## 2022-01-15 DIAGNOSIS — K219 Gastro-esophageal reflux disease without esophagitis: Secondary | ICD-10-CM | POA: Diagnosis not present

## 2022-01-15 DIAGNOSIS — E11610 Type 2 diabetes mellitus with diabetic neuropathic arthropathy: Secondary | ICD-10-CM | POA: Diagnosis not present

## 2022-01-15 DIAGNOSIS — Z7984 Long term (current) use of oral hypoglycemic drugs: Secondary | ICD-10-CM

## 2022-01-15 DIAGNOSIS — F259 Schizoaffective disorder, unspecified: Secondary | ICD-10-CM | POA: Diagnosis present

## 2022-01-15 DIAGNOSIS — R52 Pain, unspecified: Secondary | ICD-10-CM | POA: Diagnosis not present

## 2022-01-15 DIAGNOSIS — Z8249 Family history of ischemic heart disease and other diseases of the circulatory system: Secondary | ICD-10-CM

## 2022-01-15 DIAGNOSIS — Z720 Tobacco use: Secondary | ICD-10-CM

## 2022-01-15 DIAGNOSIS — Z794 Long term (current) use of insulin: Secondary | ICD-10-CM | POA: Diagnosis not present

## 2022-01-15 DIAGNOSIS — N179 Acute kidney failure, unspecified: Secondary | ICD-10-CM | POA: Diagnosis not present

## 2022-01-15 DIAGNOSIS — Z833 Family history of diabetes mellitus: Secondary | ICD-10-CM | POA: Diagnosis not present

## 2022-01-15 DIAGNOSIS — E785 Hyperlipidemia, unspecified: Secondary | ICD-10-CM | POA: Diagnosis present

## 2022-01-15 DIAGNOSIS — Z8042 Family history of malignant neoplasm of prostate: Secondary | ICD-10-CM

## 2022-01-15 DIAGNOSIS — Z743 Need for continuous supervision: Secondary | ICD-10-CM | POA: Diagnosis not present

## 2022-01-15 DIAGNOSIS — Z87891 Personal history of nicotine dependence: Secondary | ICD-10-CM

## 2022-01-15 DIAGNOSIS — F17210 Nicotine dependence, cigarettes, uncomplicated: Secondary | ICD-10-CM | POA: Diagnosis not present

## 2022-01-15 DIAGNOSIS — R609 Edema, unspecified: Secondary | ICD-10-CM | POA: Diagnosis not present

## 2022-01-15 DIAGNOSIS — Z803 Family history of malignant neoplasm of breast: Secondary | ICD-10-CM

## 2022-01-15 DIAGNOSIS — F17200 Nicotine dependence, unspecified, uncomplicated: Secondary | ICD-10-CM

## 2022-01-15 DIAGNOSIS — Z79899 Other long term (current) drug therapy: Secondary | ICD-10-CM | POA: Diagnosis not present

## 2022-01-15 DIAGNOSIS — E1142 Type 2 diabetes mellitus with diabetic polyneuropathy: Secondary | ICD-10-CM

## 2022-01-15 LAB — BASIC METABOLIC PANEL
Anion gap: 9 (ref 5–15)
BUN: 37 mg/dL — ABNORMAL HIGH (ref 6–20)
CO2: 19 mmol/L — ABNORMAL LOW (ref 22–32)
Calcium: 8.8 mg/dL — ABNORMAL LOW (ref 8.9–10.3)
Chloride: 108 mmol/L (ref 98–111)
Creatinine, Ser: 3.36 mg/dL — ABNORMAL HIGH (ref 0.44–1.00)
GFR, Estimated: 15 mL/min — ABNORMAL LOW (ref >60)
Glucose, Bld: 153 mg/dL — ABNORMAL HIGH (ref 70–99)
Potassium: 5.2 mmol/L — ABNORMAL HIGH (ref 3.5–5.1)
Sodium: 136 mmol/L (ref 135–145)

## 2022-01-15 LAB — CBC WITH DIFFERENTIAL/PLATELET
Abs Immature Granulocytes: 0.03 10*3/uL (ref 0.00–0.07)
Basophils Absolute: 0.1 10*3/uL (ref 0.0–0.1)
Basophils Relative: 1 %
Eosinophils Absolute: 0.3 10*3/uL (ref 0.0–0.5)
Eosinophils Relative: 3 %
HCT: 30.9 % — ABNORMAL LOW (ref 36.0–46.0)
Hemoglobin: 9.9 g/dL — ABNORMAL LOW (ref 12.0–15.0)
Immature Granulocytes: 0 %
Lymphocytes Relative: 29 %
Lymphs Abs: 2.9 10*3/uL (ref 0.7–4.0)
MCH: 30.1 pg (ref 26.0–34.0)
MCHC: 32 g/dL (ref 30.0–36.0)
MCV: 93.9 fL (ref 80.0–100.0)
Monocytes Absolute: 0.5 10*3/uL (ref 0.1–1.0)
Monocytes Relative: 5 %
Neutro Abs: 6.1 10*3/uL (ref 1.7–7.7)
Neutrophils Relative %: 62 %
Platelets: 240 10*3/uL (ref 150–400)
RBC: 3.29 MIL/uL — ABNORMAL LOW (ref 3.87–5.11)
RDW: 15 % (ref 11.5–15.5)
WBC: 9.9 10*3/uL (ref 4.0–10.5)
nRBC: 0 % (ref 0.0–0.2)

## 2022-01-15 LAB — CBG MONITORING, ED: Glucose-Capillary: 144 mg/dL — ABNORMAL HIGH (ref 70–99)

## 2022-01-15 LAB — TROPONIN I (HIGH SENSITIVITY): Troponin I (High Sensitivity): 12 ng/L (ref <18)

## 2022-01-15 LAB — POTASSIUM: Potassium: 5.4 mmol/L — ABNORMAL HIGH (ref 3.5–5.1)

## 2022-01-15 MED ORDER — SODIUM ZIRCONIUM CYCLOSILICATE 5 G PO PACK
10.0000 g | PACK | Freq: Once | ORAL | Status: AC
  Administered 2022-01-15: 10 g via ORAL
  Filled 2022-01-15: qty 2

## 2022-01-15 MED ORDER — SODIUM CHLORIDE 0.9 % IV BOLUS
500.0000 mL | Freq: Once | INTRAVENOUS | Status: AC
  Administered 2022-01-15: 500 mL via INTRAVENOUS

## 2022-01-15 NOTE — ED Provider Notes (Signed)
[facility] EMERGENCY DEPARTMENT Provider Note   CSN: [id number] Arrival date & time: 01/15/22  1937     History {Add pertinent medical, surgical, social history, OB history to HPI:1} Chief Complaint  Patient presents with   Leg Swelling    Left lower    [name] is a 56 y.o. female.  Patient complains of swelling to both of her lower legs.  With redness to the left lower leg.  Patient has history of diabetes and hypertension   Leg Pain      Home Medications Prior to Admission medications   Medication Sig Start Date End Date Taking? Authorizing Provider  ACCU-CHEK GUIDE test strip USE TO CHECK BLOOD SUGAR FOUR TIMES DAILY 10/25/21   Lindell Spar, MD  acetaminophen (TYLENOL) 325 MG tablet Take 2 tablets (650 mg total) by mouth every 6 (six) hours as needed for mild pain. 06/04/19   Nicole Kindred A, DO  ARIPiprazole (ABILIFY) 15 MG tablet Take 1 tablet (15 mg total) by mouth daily. 02/18/21   Lindell Spar, MD  atorvastatin (LIPITOR) 40 MG tablet Take 1 tablet (40 mg total) by mouth daily. 02/18/21   Lindell Spar, MD  blood glucose meter kit and supplies Dispense based on patient and insurance preference. Use up to four times daily as directed. (FOR ICD-10 E10.9, E11.9). 09/24/20   Lindell Spar, MD  blood glucose meter kit and supplies Dispense based on patient and insurance preference. Use up to four times daily as directed. (FOR ICD-10 E10.9, E11.9). 09/14/21   Lindell Spar, MD  buPROPion (WELLBUTRIN XL) 300 MG 24 hr tablet TAKE 1 TABLET BY MOUTH EVERY MORNING 02/18/21   Lindell Spar, MD  colestipol (COLESTID) 1 g tablet TAKE TWO (2) TABLETS BY MOUTH TWICE DAILY 02/18/21   Lindell Spar, MD  Continuous Blood Gluc Receiver ([place]) DEVI Check blood glucose from sensor with receiver. 11/23/21   Lindell Spar, MD  Continuous Blood Gluc Sensor ([place]) MISC Please use it to check blood glucose 3 times before meals, at bedtime and as  needed. 11/23/21   Lindell Spar, MD  furosemide (LASIX) 40 MG tablet TAKE (1) TABLET BY MOUTH ONCE DAILY. 01/05/22   Lindell Spar, MD  gabapentin (NEURONTIN) 300 MG capsule Take 1 capsule (300 mg total) by mouth at bedtime. Patient taking differently: Take 300 mg by mouth daily. 02/18/21   Lindell Spar, MD  hydrochlorothiazide (HYDRODIURIL) 12.5 MG tablet Take 12.5 mg by mouth daily. 11/16/20   [provider]  ibuprofen (ADVIL) 200 MG tablet Take 400 mg by mouth every 6 (six) hours as needed.    [provider]  Lancets (ONETOUCH DELICA PLUS [id number]) MISC USE TO TEST 3 TIMES DAILY. 06/29/20   Lindell Spar, MD  LANTUS SOLOSTAR 100 UNIT/ML Solostar Pen INJECT 30 UNITS INTO THE SKIN ONCE DAILY AT BEDTIME 12/02/21   Lindell Spar, MD  losartan-hydrochlorothiazide (HYZAAR) 50-12.5 MG tablet TAKE (1) TABLET BY MOUTH ONCE DAILY. 01/05/22   Lindell Spar, MD  metFORMIN (GLUCOPHAGE) 1000 MG tablet Take 1 tablet (1,000 mg total) by mouth 2 (two) times daily with a meal. 11/23/21   Lindell Spar, MD  metoprolol succinate (TOPROL-XL) 50 MG 24 hr tablet Take 1 tablet (50 mg total) by mouth daily. Take with or immediately following a meal. 02/18/21   Lindell Spar, MD  mupirocin ointment (BACTROBAN) 2 % APPLY TO AFFECTED AREA TWICE DAILY  08/28/20   McDonald, Stephan Minister, DPM  ondansetron (ZOFRAN) 4 MG tablet Take 1 tablet (4 mg total) by mouth every 6 (six) hours. Patient not taking: Reported on 12/08/2021 05/16/19   Wurst, [place], PA-C  pantoprazole (PROTONIX) 40 MG tablet TAKE (1) TABLET BY MOUTH ONCE DAILY. 01/07/22   Lindell Spar, MD  polyethylene glycol-electrolytes (TRILYTE) 420 g solution Take 4,000 mLs by mouth as directed. 08/03/20   Harvel Quale, MD  traZODone (DESYREL) 50 MG tablet Take 1 tablet (50 mg total) by mouth at bedtime as needed. for sleep 02/18/21   Lindell Spar, MD      Allergies    Patient has no known allergies.    Review of Systems   Review of  Systems  Physical Exam Updated Vital Signs BP 115/71   Pulse 74   Temp 98 F (36.7 C) (Oral)   Resp 17   Ht '5\' 10"'  (1.778 m)   Wt 113.4 kg   SpO2 98%   BMI 35.87 kg/m  Physical Exam  ED Results / Procedures / Treatments   Labs (all labs ordered are listed, but only abnormal results are displayed) Labs Reviewed  CBC WITH DIFFERENTIAL/PLATELET - Abnormal; Notable for the following components:      Result Value   RBC 3.29 (*)    Hemoglobin 9.9 (*)    HCT 30.9 (*)    All other components within normal limits  BASIC METABOLIC PANEL - Abnormal; Notable for the following components:   Potassium 5.2 (*)    CO2 19 (*)    Glucose, Bld 153 (*)    BUN 37 (*)    Creatinine, Ser 3.36 (*)    Calcium 8.8 (*)    GFR, Estimated 15 (*)    All other components within normal limits  CBG MONITORING, ED - Abnormal; Notable for the following components:   Glucose-Capillary 144 (*)    All other components within normal limits  POTASSIUM  POTASSIUM  POTASSIUM  OSMOLALITY  URINE ELECTROLYTES    EKG None  Radiology No results found.  Procedures Procedures  {Document cardiac monitor, telemetry assessment procedure when appropriate:1}  Medications Ordered in ED Medications  sodium chloride 0.9 % bolus 500 mL (has no administration in time range)  sodium zirconium cyclosilicate (LOKELMA) packet 10 g (has no administration in time range)    ED Course/ Medical Decision Making/ A&P                           Medical Decision Making Amount and/or Complexity of Data Reviewed Labs: ordered.  Risk Decision regarding hospitalization.   Patient with AKI and significant edema in both legs.  She will be admitted to medicine  {Document critical care time when appropriate:1} {Document review of labs and clinical decision tools ie heart score, Chads2Vasc2 etc:1}  {Document your independent review of radiology images, and any outside records:1} {Document your discussion with family  members, caretakers, and with consultants:1} {Document social determinants of health affecting pt's care:1} {Document your decision making why or why not admission, treatments were needed:1} Final Clinical Impression(s) / ED Diagnoses Final diagnoses:  Peripheral edema  AKI (acute kidney injury) ([place])    Rx / DC Orders ED Discharge Orders     None

## 2022-01-15 NOTE — ED Triage Notes (Signed)
Pt with left lower leg swelling x 3 days. Hx of cellulitis, redness noted to lower left leg. Denies N/V or fever.

## 2022-01-15 NOTE — ED Notes (Signed)
Dodi, NT from 3rd floor on way to transport pt upstairs.

## 2022-01-16 DIAGNOSIS — F259 Schizoaffective disorder, unspecified: Secondary | ICD-10-CM | POA: Diagnosis present

## 2022-01-16 DIAGNOSIS — Z803 Family history of malignant neoplasm of breast: Secondary | ICD-10-CM | POA: Diagnosis not present

## 2022-01-16 DIAGNOSIS — Z8249 Family history of ischemic heart disease and other diseases of the circulatory system: Secondary | ICD-10-CM | POA: Diagnosis not present

## 2022-01-16 DIAGNOSIS — F17200 Nicotine dependence, unspecified, uncomplicated: Secondary | ICD-10-CM

## 2022-01-16 DIAGNOSIS — Z720 Tobacco use: Secondary | ICD-10-CM

## 2022-01-16 DIAGNOSIS — K219 Gastro-esophageal reflux disease without esophagitis: Secondary | ICD-10-CM | POA: Diagnosis not present

## 2022-01-16 DIAGNOSIS — N179 Acute kidney failure, unspecified: Principal | ICD-10-CM

## 2022-01-16 DIAGNOSIS — F17210 Nicotine dependence, cigarettes, uncomplicated: Secondary | ICD-10-CM | POA: Diagnosis present

## 2022-01-16 DIAGNOSIS — R600 Localized edema: Secondary | ICD-10-CM | POA: Diagnosis not present

## 2022-01-16 DIAGNOSIS — E875 Hyperkalemia: Secondary | ICD-10-CM | POA: Diagnosis not present

## 2022-01-16 DIAGNOSIS — F251 Schizoaffective disorder, depressive type: Secondary | ICD-10-CM

## 2022-01-16 DIAGNOSIS — Z833 Family history of diabetes mellitus: Secondary | ICD-10-CM | POA: Diagnosis not present

## 2022-01-16 DIAGNOSIS — Z794 Long term (current) use of insulin: Secondary | ICD-10-CM | POA: Diagnosis not present

## 2022-01-16 DIAGNOSIS — I10 Essential (primary) hypertension: Secondary | ICD-10-CM

## 2022-01-16 DIAGNOSIS — E1149 Type 2 diabetes mellitus with other diabetic neurological complication: Secondary | ICD-10-CM | POA: Diagnosis not present

## 2022-01-16 DIAGNOSIS — Z79899 Other long term (current) drug therapy: Secondary | ICD-10-CM | POA: Diagnosis not present

## 2022-01-16 DIAGNOSIS — E11610 Type 2 diabetes mellitus with diabetic neuropathic arthropathy: Secondary | ICD-10-CM | POA: Diagnosis present

## 2022-01-16 DIAGNOSIS — Z7984 Long term (current) use of oral hypoglycemic drugs: Secondary | ICD-10-CM | POA: Diagnosis not present

## 2022-01-16 DIAGNOSIS — Z8042 Family history of malignant neoplasm of prostate: Secondary | ICD-10-CM | POA: Diagnosis not present

## 2022-01-16 DIAGNOSIS — R609 Edema, unspecified: Secondary | ICD-10-CM | POA: Diagnosis present

## 2022-01-16 DIAGNOSIS — E785 Hyperlipidemia, unspecified: Secondary | ICD-10-CM | POA: Diagnosis present

## 2022-01-16 DIAGNOSIS — Z87891 Personal history of nicotine dependence: Secondary | ICD-10-CM

## 2022-01-16 LAB — COMPREHENSIVE METABOLIC PANEL
ALT: 18 U/L (ref 0–44)
AST: 13 U/L — ABNORMAL LOW (ref 15–41)
Albumin: 3.3 g/dL — ABNORMAL LOW (ref 3.5–5.0)
Alkaline Phosphatase: 127 U/L — ABNORMAL HIGH (ref 38–126)
Anion gap: 6 (ref 5–15)
BUN: 34 mg/dL — ABNORMAL HIGH (ref 6–20)
CO2: 22 mmol/L (ref 22–32)
Calcium: 8.6 mg/dL — ABNORMAL LOW (ref 8.9–10.3)
Chloride: 108 mmol/L (ref 98–111)
Creatinine, Ser: 2.96 mg/dL — ABNORMAL HIGH (ref 0.44–1.00)
GFR, Estimated: 18 mL/min — ABNORMAL LOW (ref >60)
Glucose, Bld: 201 mg/dL — ABNORMAL HIGH (ref 70–99)
Potassium: 5.2 mmol/L — ABNORMAL HIGH (ref 3.5–5.1)
Sodium: 136 mmol/L (ref 135–145)
Total Bilirubin: 0.2 mg/dL — ABNORMAL LOW (ref 0.3–1.2)
Total Protein: 6.5 g/dL (ref 6.5–8.1)

## 2022-01-16 LAB — HEMOGLOBIN A1C
Hgb A1c MFr Bld: 8.1 % — ABNORMAL HIGH (ref 4.8–5.6)
Mean Plasma Glucose: 185.77 mg/dL

## 2022-01-16 LAB — CBC WITH DIFFERENTIAL/PLATELET
Abs Immature Granulocytes: 0.02 10*3/uL (ref 0.00–0.07)
Basophils Absolute: 0.1 10*3/uL (ref 0.0–0.1)
Basophils Relative: 1 %
Eosinophils Absolute: 0.3 10*3/uL (ref 0.0–0.5)
Eosinophils Relative: 3 %
HCT: 30.3 % — ABNORMAL LOW (ref 36.0–46.0)
Hemoglobin: 9.4 g/dL — ABNORMAL LOW (ref 12.0–15.0)
Immature Granulocytes: 0 %
Lymphocytes Relative: 34 %
Lymphs Abs: 2.8 10*3/uL (ref 0.7–4.0)
MCH: 29.5 pg (ref 26.0–34.0)
MCHC: 31 g/dL (ref 30.0–36.0)
MCV: 95 fL (ref 80.0–100.0)
Monocytes Absolute: 0.5 10*3/uL (ref 0.1–1.0)
Monocytes Relative: 6 %
Neutro Abs: 4.7 10*3/uL (ref 1.7–7.7)
Neutrophils Relative %: 56 %
Platelets: 224 10*3/uL (ref 150–400)
RBC: 3.19 MIL/uL — ABNORMAL LOW (ref 3.87–5.11)
RDW: 15 % (ref 11.5–15.5)
WBC: 8.4 10*3/uL (ref 4.0–10.5)
nRBC: 0 % (ref 0.0–0.2)

## 2022-01-16 LAB — OSMOLALITY: Osmolality: 308 mOsm/kg — ABNORMAL HIGH (ref 275–295)

## 2022-01-16 LAB — GLUCOSE, CAPILLARY
Glucose-Capillary: 112 mg/dL — ABNORMAL HIGH (ref 70–99)
Glucose-Capillary: 160 mg/dL — ABNORMAL HIGH (ref 70–99)
Glucose-Capillary: 124 mg/dL — ABNORMAL HIGH (ref 70–99)
Glucose-Capillary: 137 mg/dL — ABNORMAL HIGH (ref 70–99)
Glucose-Capillary: 169 mg/dL — ABNORMAL HIGH (ref 70–99)

## 2022-01-16 LAB — MAGNESIUM: Magnesium: 1.8 mg/dL (ref 1.7–2.4)

## 2022-01-16 LAB — POTASSIUM: Potassium: 5.2 mmol/L — ABNORMAL HIGH (ref 3.5–5.1)

## 2022-01-16 LAB — HIV ANTIBODY (ROUTINE TESTING W REFLEX): HIV Screen 4th Generation wRfx: NONREACTIVE

## 2022-01-16 MED ORDER — INSULIN DETEMIR 100 UNIT/ML ~~LOC~~ SOLN
20.0000 [IU] | Freq: Every day | SUBCUTANEOUS | Status: DC
  Administered 2022-01-16 – 2022-01-17 (×2): 20 [IU] via SUBCUTANEOUS
  Filled 2022-01-16 (×4): qty 0.2

## 2022-01-16 MED ORDER — GABAPENTIN 300 MG PO CAPS
300.0000 mg | ORAL_CAPSULE | Freq: Every day | ORAL | Status: DC
  Administered 2022-01-16: 300 mg via ORAL
  Filled 2022-01-16: qty 1

## 2022-01-16 MED ORDER — ACETAMINOPHEN 650 MG RE SUPP: 650.0000 mg | Freq: Four times a day (QID) | RECTAL | Status: DC | PRN

## 2022-01-16 MED ORDER — ONDANSETRON HCL 4 MG/2ML IJ SOLN: 4.0000 mg | Freq: Four times a day (QID) | INTRAMUSCULAR | Status: DC | PRN

## 2022-01-16 MED ORDER — MORPHINE SULFATE (PF) 2 MG/ML IV SOLN: 2.0000 mg | INTRAVENOUS | Status: DC | PRN

## 2022-01-16 MED ORDER — HEPARIN SODIUM (PORCINE) 5000 UNIT/ML IJ SOLN
5000.0000 [IU] | Freq: Three times a day (TID) | INTRAMUSCULAR | Status: DC
  Administered 2022-01-16 – 2022-01-18 (×7): 5000 [IU] via SUBCUTANEOUS
  Filled 2022-01-16 (×7): qty 1

## 2022-01-16 MED ORDER — SODIUM CHLORIDE 0.9 % IV SOLN: INTRAVENOUS | Status: AC

## 2022-01-16 MED ORDER — ONDANSETRON HCL 4 MG PO TABS: 4.0000 mg | ORAL_TABLET | Freq: Four times a day (QID) | ORAL | Status: DC | PRN

## 2022-01-16 MED ORDER — SODIUM ZIRCONIUM CYCLOSILICATE 10 G PO PACK
10.0000 g | PACK | Freq: Once | ORAL | Status: AC
  Administered 2022-01-16: 10 g via ORAL
  Filled 2022-01-16: qty 1

## 2022-01-16 MED ORDER — NICOTINE 21 MG/24HR TD PT24
21.0000 mg | MEDICATED_PATCH | Freq: Every day | TRANSDERMAL | Status: DC
  Administered 2022-01-16 – 2022-01-18 (×3): 21 mg via TRANSDERMAL
  Filled 2022-01-16 (×3): qty 1

## 2022-01-16 MED ORDER — ATORVASTATIN CALCIUM 40 MG PO TABS
40.0000 mg | ORAL_TABLET | Freq: Every day | ORAL | Status: DC
  Administered 2022-01-16 – 2022-01-18 (×3): 40 mg via ORAL
  Filled 2022-01-16 (×3): qty 1

## 2022-01-16 MED ORDER — INSULIN ASPART 100 UNIT/ML IJ SOLN: 0.0000 [IU] | Freq: Every day | INTRAMUSCULAR | Status: DC

## 2022-01-16 MED ORDER — TRAZODONE HCL 50 MG PO TABS: 50.0000 mg | ORAL_TABLET | Freq: Every evening | ORAL | Status: DC | PRN

## 2022-01-16 MED ORDER — BUPROPION HCL ER (XL) 300 MG PO TB24
300.0000 mg | ORAL_TABLET | Freq: Every day | ORAL | Status: DC
  Administered 2022-01-16: 300 mg via ORAL
  Filled 2022-01-16: qty 1

## 2022-01-16 MED ORDER — INSULIN ASPART 100 UNIT/ML IJ SOLN
0.0000 [IU] | Freq: Three times a day (TID) | INTRAMUSCULAR | Status: DC
  Administered 2022-01-16: 2 [IU] via SUBCUTANEOUS
  Administered 2022-01-16 (×2): 3 [IU] via SUBCUTANEOUS
  Administered 2022-01-17: 2 [IU] via SUBCUTANEOUS
  Administered 2022-01-17: 3 [IU] via SUBCUTANEOUS

## 2022-01-16 MED ORDER — COLESTIPOL HCL 1 G PO TABS
2.0000 g | ORAL_TABLET | Freq: Two times a day (BID) | ORAL | Status: DC
  Administered 2022-01-16 – 2022-01-18 (×5): 2 g via ORAL
  Filled 2022-01-16 (×9): qty 2

## 2022-01-16 MED ORDER — METOPROLOL SUCCINATE ER 50 MG PO TB24
50.0000 mg | ORAL_TABLET | Freq: Every day | ORAL | Status: DC
  Administered 2022-01-16 – 2022-01-18 (×3): 50 mg via ORAL
  Filled 2022-01-16 (×3): qty 1

## 2022-01-16 MED ORDER — MORPHINE SULFATE (PF) 2 MG/ML IV SOLN: 1.0000 mg | INTRAVENOUS | Status: DC | PRN

## 2022-01-16 MED ORDER — LOPERAMIDE HCL 2 MG PO CAPS
2.0000 mg | ORAL_CAPSULE | ORAL | Status: DC | PRN
  Administered 2022-01-16: 2 mg via ORAL
  Filled 2022-01-16: qty 1

## 2022-01-16 MED ORDER — ACETAMINOPHEN 325 MG PO TABS
650.0000 mg | ORAL_TABLET | Freq: Four times a day (QID) | ORAL | Status: DC | PRN
  Administered 2022-01-17: 650 mg via ORAL
  Filled 2022-01-16: qty 2

## 2022-01-16 MED ORDER — OXYCODONE HCL 5 MG PO TABS: 5.0000 mg | ORAL_TABLET | ORAL | Status: DC | PRN

## 2022-01-16 MED ORDER — ARIPIPRAZOLE 5 MG PO TABS
15.0000 mg | ORAL_TABLET | Freq: Every day | ORAL | Status: DC
  Administered 2022-01-16 – 2022-01-18 (×3): 15 mg via ORAL
  Filled 2022-01-16 (×4): qty 1

## 2022-01-16 NOTE — Assessment & Plan Note (Addendum)
-   was Holding Lasix in the setting of AKI,res summed a lower dose 20 mg daily  - Edema worse on the left than the right - Ultrasound ruled out DVT

## 2022-01-16 NOTE — H&P (Signed)
History and Physical    Patient: [name] WUJ:[id number] DOB: [birth year] DOA: 01/15/2022 DOS: the patient was seen and examined on 01/16/2022 PCP: Lindell Spar, MD  Patient coming from: Home  Chief Complaint:  Chief Complaint  Patient presents with   Leg Swelling    Left lower   HPI: [name] is a 56 y.o. 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  Patient reports that 3 or 4 days ago she started having left lower extremity edema.  Its been worse since it started.  It is not painful because she is numb in her lower extremity secondary to neuropathy.  She denies any new weakness in the left leg.  She has not seen any weeping, oozing, drainage from the leg.  She had something similar happen years ago, but she cannot remember what the diagnosis was.  She does not think she is ever had a blood clot.  Patient does report dyspnea that started today.  She denies cough and fever.  She denies injury to the leg.  Patient ambulates mostly with a wheelchair.  Patient reports she has chest pain in the center of her chest that feels like pressure.  Started today.  Worse with exertion.  Her left shoulder hurts as well.  The pain in her left shoulder feels like a soreness.  No radiation to her neck.  No palpitations.  Patient did feel lightheaded when getting out of the shower today.  The lightheaded feeling went away upon drinking water.  Patient reports she has had polyphasia recently.  No polyuria or polydipsia.  Patient is vaccinated for COVID.  Patient is full code.  Patient asked to be evaluated for tapeworm.  She has had no weight loss.  History is not consistent with tapeworm.  No further work-up indicated. Review of Systems: As mentioned in the history of present illness. All other  systems reviewed and are negative. Past Medical History:  Diagnosis Date   Charcot's joint of foot, left    Depression    Diabetes mellitus without complication ([place])    Hyperlipidemia    Hypertension    Neuromuscular disorder ([place])    Schizo affective schizophrenia ([place])    abilify and pazil   Past Surgical History:  Procedure Laterality Date   CHOLECYSTECTOMY     COLONOSCOPY WITH PROPOFOL N/A 05/26/2019   Procedure: COLONOSCOPY WITH PROPOFOL;  Surgeon: Toledo, Benay Pike, MD;  Location: [facility];  Service: Gastroenterology;  Laterality: N/A;   ESOPHAGOGASTRODUODENOSCOPY (EGD) WITH PROPOFOL N/A 05/26/2019   Procedure: ESOPHAGOGASTRODUODENOSCOPY (EGD) WITH PROPOFOL;  Surgeon: Toledo, Benay Pike, MD;  Location: [facility];  Service: Gastroenterology;  Laterality: N/A;   IRRIGATION AND DEBRIDEMENT FOOT Right 02/26/2016   Procedure: RIGHT 2ND TOE DEBRIDEMENT;  Surgeon: Samara Deist, DPM;  Location: [facility] ORS;  Service: Podiatry;  Laterality: Right;   IRRIGATION AND DEBRIDEMENT FOOT Left 12/13/2018   Procedure: IRRIGATION AND DEBRIDEMENT FOOT;  Surgeon: Caroline More, DPM;  Location: [facility] ORS;  Service: Podiatry;  Laterality: Left;   Social History:  reports that she has been smoking cigarettes. She has a 30.00 pack-year smoking history. She has never used smokeless tobacco. She reports that she does not drink alcohol and does not use drugs.  No Known Allergies  Family History  Problem Relation Age of Onset   Breast cancer Cousin  maternal cousin   Hypertension Mother    Hyperthyroidism Mother    Dementia Mother    Prostate cancer Father    Diabetes Maternal Grandmother    Hypertension Maternal Grandmother    Cancer Maternal Grandmother        unknown type   Diabetes Maternal Grandfather    Hypertension Maternal Grandfather    Diabetes Paternal Grandmother    Hypertension Paternal Grandmother    Diabetes Paternal Grandfather    Hypertension Paternal Grandfather      Prior to Admission medications   Medication Sig Start Date End Date Taking? Authorizing Provider  ACCU-CHEK GUIDE test strip USE TO CHECK BLOOD SUGAR FOUR TIMES DAILY 10/25/21   Lindell Spar, MD  acetaminophen (TYLENOL) 325 MG tablet Take 2 tablets (650 mg total) by mouth every 6 (six) hours as needed for mild pain. 06/04/19   Nicole Kindred A, DO  ARIPiprazole (ABILIFY) 15 MG tablet Take 1 tablet (15 mg total) by mouth daily. 02/18/21   Lindell Spar, MD  atorvastatin (LIPITOR) 40 MG tablet Take 1 tablet (40 mg total) by mouth daily. 02/18/21   Lindell Spar, MD  blood glucose meter kit and supplies Dispense based on patient and insurance preference. Use up to four times daily as directed. (FOR ICD-10 E10.9, E11.9). 09/24/20   Lindell Spar, MD  blood glucose meter kit and supplies Dispense based on patient and insurance preference. Use up to four times daily as directed. (FOR ICD-10 E10.9, E11.9). 09/14/21   Lindell Spar, MD  buPROPion (WELLBUTRIN XL) 300 MG 24 hr tablet TAKE 1 TABLET BY MOUTH EVERY MORNING 02/18/21   Lindell Spar, MD  colestipol (COLESTID) 1 g tablet TAKE TWO (2) TABLETS BY MOUTH TWICE DAILY 02/18/21   Lindell Spar, MD  Continuous Blood Gluc Receiver ([place]) DEVI Check blood glucose from sensor with receiver. 11/23/21   Lindell Spar, MD  Continuous Blood Gluc Sensor ([place]) MISC Please use it to check blood glucose 3 times before meals, at bedtime and as needed. 11/23/21   Lindell Spar, MD  furosemide (LASIX) 40 MG tablet TAKE (1) TABLET BY MOUTH ONCE DAILY. 01/05/22   Lindell Spar, MD  gabapentin (NEURONTIN) 300 MG capsule Take 1 capsule (300 mg total) by mouth at bedtime. Patient taking differently: Take 300 mg by mouth daily. 02/18/21   Lindell Spar, MD  hydrochlorothiazide (HYDRODIURIL) 12.5 MG tablet Take 12.5 mg by mouth daily. 11/16/20   [provider]  ibuprofen (ADVIL) 200 MG tablet Take 400 mg by mouth every 6  (six) hours as needed.    [provider]  Lancets (ONETOUCH DELICA PLUS [id number]) MISC USE TO TEST 3 TIMES DAILY. 06/29/20   Lindell Spar, MD  LANTUS SOLOSTAR 100 UNIT/ML Solostar Pen INJECT 30 UNITS INTO THE SKIN ONCE DAILY AT BEDTIME 12/02/21   Lindell Spar, MD  losartan-hydrochlorothiazide (HYZAAR) 50-12.5 MG tablet TAKE (1) TABLET BY MOUTH ONCE DAILY. 01/05/22   Lindell Spar, MD  metFORMIN (GLUCOPHAGE) 1000 MG tablet Take 1 tablet (1,000 mg total) by mouth 2 (two) times daily with a meal. 11/23/21   Lindell Spar, MD  metoprolol succinate (TOPROL-XL) 50 MG 24 hr tablet Take 1 tablet (50 mg total) by mouth daily. Take with or immediately following a meal. 02/18/21   Lindell Spar, MD  mupirocin ointment (BACTROBAN) 2 % APPLY TO AFFECTED AREA TWICE DAILY 08/28/20   Criselda Peaches, DPM  ondansetron (ZOFRAN) 4 MG tablet Take 1 tablet (4 mg total) by mouth every 6 (six) hours. Patient not taking: Reported on 12/08/2021 05/16/19   [place], [place], PA-C  pantoprazole (PROTONIX) 40 MG tablet TAKE (1) TABLET BY MOUTH ONCE DAILY. 01/07/22   Lindell Spar, MD  polyethylene glycol-electrolytes (TRILYTE) 420 g solution Take 4,000 mLs by mouth as directed. 08/03/20   Harvel Quale, MD  traZODone (DESYREL) 50 MG tablet Take 1 tablet (50 mg total) by mouth at bedtime as needed. for sleep 02/18/21   Lindell Spar, MD    Physical Exam: Vitals:   01/15/22 2130 01/15/22 2230 01/15/22 2302 01/15/22 2303  BP: (!) 109/55 112/84 (!) 155/132   Pulse: 79 74 77   Resp: '18 17 18   ' Temp:  98.1 F (36.7 C) 97.6 F (36.4 C)   TempSrc:  Oral Oral   SpO2: 99% 98% 100%   Weight:    118.4 kg  Height:    '5\' 10"'  (1.778 m)   1.  General: Patient lying supine in bed, head of bed elevated, no acute distress   2. Psychiatric: Alert and oriented x 3, mood and behavior normal for situation, pleasant and cooperative with exam   3. Neurologic: Speech and language are normal, face is symmetric,  moves all 4 extremities voluntarily, at baseline without acute deficits on limited exam   4. HEENMT:  Head is atraumatic, normocephalic, pupils reactive to light, neck is supple, trachea is midline, mucous membranes are moist   5. Respiratory : Lungs are clear to auscultation bilaterally without wheezing, rhonchi, rales, no cyanosis, no increase in work of breathing or accessory muscle use   6. Cardiovascular : Heart rate normal, rhythm is regular, no murmurs, rubs or gallops, peripheral edema bilaterally greater on left than right, peripheral pulses palpated   7. Gastrointestinal:  Abdomen is soft, nondistended, nontender to palpation bowel sounds active, no masses or organomegaly palpated   8. Skin:  Skin is warm, dry and intact without rashes, acute lesions, no erythema in the lower extremities   9.Musculoskeletal:  No acute deformities or trauma, no asymmetry in tone, peripheral edema present, peripheral pulses palpated, no tenderness to palpation in the extremities  Data Reviewed: In the ED Temp 98, heart rate 74-85, respiratory rate 17, blood pressure 97/57-135/75, satting 99% No leukocytosis and white blood cell count 9.9, hemoglobin 9.9 down from 10.6 Chemistry shows a potassium of 5.2, bicarb 19, BUN elevated 37, creatinine elevated 3.36 No interventions in the ED and admission requested for AKI  Assessment and Plan: * AKI (acute kidney injury) ([place]) - Creatinine increased from 1.88>> 3.36 - With associated hyperkalemia 5.2 - Unclear etiology, no change in urine output, denies chronic NSAID use, denies new medications - Hold ARB and HCTZ - Hold Protonix - NS 500 bolus - Ultrasound renal - Creatinine in the a.m.  Tobacco use disorder - Start nicotine patch - Counseled on importance of cessation  Lower extremity edema - Holding Lasix in the setting of AKI - Edema worse on the left than the right - Ultrasound rule out DVT - Continue to  monitor  Hyperkalemia Potassium minimally elevated at 5.2 - Lokelma and 500 NS bolus - Trend in the a.m. - Hyperkalemia is most likely secondary to AKI  GERD (gastroesophageal reflux disease) - Holding Protonix in the setting of AKI  Schizoaffective disorder (HCC) - Continue Abilify, trazodone, Wellbutrin  Essential hypertension - Holding ARB and HCTZ secondary to AKI - Continue metoprolol  Type 2 diabetes mellitus with neurological complications (HCC) - 30 units of basal insulin at baseline - Continue 20 units of basal insulin and sliding scale coverage - Carb modified diet - Check hemoglobin A1c - Continue to monitor      Advance Care Planning:   Code Status: Full Code   Consults: None  Family Communication: No family at bedside  Severity of Illness: The appropriate patient status for this patient is OBSERVATION. Observation status is judged to be reasonable and necessary in order to provide the required intensity of service to ensure the patient's safety. The patient's presenting symptoms, physical exam findings, and initial radiographic and laboratory data in the context of their medical condition is felt to place them at decreased risk for further clinical deterioration. Furthermore, it is anticipated that the patient will be medically stable for discharge from the hospital within 2 midnights of admission.   Author: Rolla Plate, DO 01/16/2022 12:15 AM  For on call review [email or web address].

## 2022-01-16 NOTE — Assessment & Plan Note (Addendum)
-   We will continue holding Protonix in the setting of AKI

## 2022-01-16 NOTE — Assessment & Plan Note (Addendum)
Potassium minimally elevated at 5.2 >>> still 5.2 this a.m. - Lokelma was given another dose today -Monitoring potassium level closely, -Could be contributory from AKI, continue gentle IV fluid hydration with normal saline

## 2022-01-16 NOTE — Assessment & Plan Note (Addendum)
-   We will continue  holding ARB and HCTZ secondary to AKI - Continue metoprolol -Blood pressure remained stable,

## 2022-01-16 NOTE — Assessment & Plan Note (Addendum)
-   Started nicotine patch - Counseled on importance of cessation

## 2022-01-16 NOTE — Assessment & Plan Note (Addendum)
-   Checking CBG q. ACHS, SSI coverage - 30 units of basal insulin at baseline - Continue 20 units of basal insulin and sliding scale coverage - Carb modified diet - A1c: CBG (last 3)  Recent Labs    01/16/22 1605 01/16/22 1942 01/17/22 0714  GLUCAP 137* 169* 148*     - Continue to monitor

## 2022-01-16 NOTE — Assessment & Plan Note (Signed)
-   Continue Abilify, trazodone, Wellbutrin

## 2022-01-16 NOTE — Progress Notes (Signed)
PROGRESS NOTE    Patient: [name]                            PCP: Lindell Spar, MD                    DOB: December 20, 1965            DOA: 01/15/2022 [id number]:[id number]             DOS: 01/16/2022, 10:57 AM   LOS: 0 days   Date of Service: The patient was seen and examined on 01/16/2022  Subjective:   The patient was seen and examined this morning. Hemodynamically stable, Reporting improved diarrhea, 1 episode loose stool yesterday, no further stooling Denies any fever chills nausea vomiting  Brief Narrative:   [name] is a 56 y.o. 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 Presents ED with a chief complaint of lower extremity edema.   Patient reports that 3 or 4 days ago she started having left lower extremity edema.  Its been worse since it started.  It is not painful because she is numb in her lower extremity secondary to neuropathy.  She denies any new weakness in the left leg.  She has not seen any weeping, oozing, drainage from the leg.  She had something similar happen years ago, but she cannot remember what the diagnosis was.  She does not think she is ever had a blood clot.  Patient does report dyspnea that started today.  She denies cough and fever.  She denies injury to the leg.  Patient ambulates mostly with a wheelchair.  Patient reports she has chest pain in the center of her chest that feels like pressure.  Started today.  Worse with exertion.  Her left shoulder hurts as well.  The pain in her left shoulder feels like a soreness.  No radiation to her neck.  No palpitations.  Patient did feel lightheaded when getting out of the shower today.  The lightheaded feeling went away upon drinking water.  Patient reports she has had polyphasia recently.  No polyuria or polydipsia.  Patient is vaccinated for COVID.  Patient is full code.    Patient asked to be evaluated for tapeworm.  She has had no weight loss.  History is not consistent with tapeworm.  No further work-up indicated.  ED Temp 98, heart rate 74-85, respiratory rate 17, blood pressure 97/57-135/75, satting 99% No leukocytosis and white blood cell count 9.9, hemoglobin 9.9 down from 10.6 Chemistry shows a potassium of 5.2, bicarb 19, BUN elevated 37, creatinine elevated 3.36 No interventions in the ED and admission requested for AKI    Assessment & Plan:   Principal Problem:   AKI (acute kidney injury) ([place]) Active Problems:   Type 2 diabetes mellitus with neurological complications (HCC)   Essential hypertension   Schizoaffective disorder (HCC)   GERD (gastroesophageal reflux disease)   Hyperkalemia   Lower extremity edema   Tobacco use disorder     Assessment and Plan: * AKI (acute kidney injury) ([place]) - Creatinine increased from 1.88>> 3.36 >> 2.96 - With associated hyperkalemia 5.2 >> still 5.2  - Unclear etiology, no change in urine output, denies chronic NSAID use, denies new medications - Hold ARB and HCTZ - Hold Protonix - NS 500 bolus--continue maintenance fluids gentle hydration - Ultrasound renal -  Trending and monitoring kidney function closely along with electrolytes  Tobacco use disorder - Started nicotine patch - Counseled on importance of cessation  Lower extremity edema - Holding Lasix in the setting of AKI - Edema worse on the left than the right - Ultrasound ruled out DVT - Continue to monitor  Hyperkalemia Potassium minimally elevated at 5.2 >>> still 5.2 this a.m. - Lokelma was given another dose today -Monitoring potassium level closely, -Could be contributory from AKI, continue gentle IV fluid hydration with normal saline   GERD (gastroesophageal reflux disease) - We will continue holding Protonix in the setting of AKI  Schizoaffective disorder (HCC) - Continue Abilify, trazodone, Wellbutrin  Essential  hypertension - We will continue  holding ARB and HCTZ secondary to AKI - Continue metoprolol  Type 2 diabetes mellitus with neurological complications (HCC) - Checking CBG q. ACHS, SSI coverage - 30 units of basal insulin at baseline - Continue 20 units of basal insulin and sliding scale coverage - Carb modified diet - A1c: CBG (last 3)  Recent Labs    01/15/22 1950 01/16/22 0140 01/16/22 0718  GLUCAP 144* 112* 160*     - Continue to monitor     ----------------------------------------------------------------------------------------------------------------------------------------------- Nutritional status:  The patient's BMI is: Body mass index is 37.45 kg/m. I agree with the assessment and plan as outlined ---------------------------------------------------------------------------------------------------------------------------------------------  DVT prophylaxis:  heparin injection 5,000 Units Start: 01/16/22 0100 SCDs Start: 01/16/22 0008   Code Status:   Code Status: Full Code  Family Communication: No family member present at bedside- attempt will be made to update daily The above findings and plan of care has been discussed with patient (and family)  in detail,  they expressed understanding and agreement of above. -Advance care planning has been discussed.   Admission status:   Status is: Inpatient Remains inpatient appropriate because: Needing close monitoring IV fluids, treating AKI, and hyperkalemia     Procedures:   No admission procedures for hospital encounter.   Antimicrobials:  Anti-infectives (From admission, onward)    None        Medication:   ARIPiprazole  15 mg Oral Daily   atorvastatin  40 mg Oral Daily   buPROPion  300 mg Oral Daily   colestipol  2 g Oral BID   gabapentin  300 mg Oral Daily   heparin  5,000 Units Subcutaneous Q8H   insulin aspart  0-15 Units Subcutaneous TID WC   insulin aspart  0-5 Units Subcutaneous QHS    insulin detemir  20 Units Subcutaneous QHS   metoprolol succinate  50 mg Oral Daily   nicotine  21 mg Transdermal Daily    acetaminophen **OR** acetaminophen, morphine injection, ondansetron **OR** ondansetron (ZOFRAN) IV, oxyCODONE, traZODone   Objective:   Vitals:   01/15/22 2302 01/15/22 2303 01/16/22 0324 01/16/22 0715  BP: (!) 155/132  122/68 123/60  Pulse: 77  81 76  Resp: '18  18 18  '$ Temp: 97.6 F (36.4 C)  97.8 F (36.6 C) 98.1 F (36.7 C)  TempSrc: Oral  Oral   SpO2: 100%  98% 98%  Weight:  118.4 kg    Height:  '5\' 10"'$  (1.778 m)      Intake/Output Summary (Last 24 hours) at 01/16/2022 1057 Last data filed at 01/16/2022 0900 Gross per 24 hour  Intake 360 ml  Output --  Net 360 ml   Filed Weights   01/15/22 1945 01/15/22 2303  Weight: 113.4 kg 118.4 kg     Examination:  Physical Exam  Constitution:  Alert, cooperative, no distress,  Appears calm and comfortable  Psychiatric:   Normal and stable mood and affect, cognition intact,   HEENT:        Normocephalic, PERRL, otherwise with in Normal limits  Chest:         Chest symmetric Cardio vascular:  S1/S2, RRR, No murmure, No Rubs or Gallops  pulmonary: Clear to auscultation bilaterally, respirations unlabored, negative wheezes / crackles Abdomen: Soft, non-tender, non-distended, bowel sounds,no masses, no organomegaly Muscular skeletal: Limited exam - in bed, able to move all 4 extremities,   Neuro: CNII-XII intact. , normal motor and sensation, reflexes intact  Extremities: No pitting edema lower extremities, +2 pulses  Skin: Dry, warm to touch, negative for any Rashes, No open wounds Wounds: per nursing documentation   ------------------------------------------------------------------------------------------------------------------------------------------    LABs:     Latest Ref Rng & Units 01/16/2022    5:31 AM 01/15/2022    8:52 PM 11/23/2021    1:38 PM  CBC  WBC 4.0 - 10.5 K/uL 8.4  9.9  11.8    Hemoglobin 12.0 - 15.0 g/dL 9.4  9.9  10.6   Hematocrit 36.0 - 46.0 % 30.3  30.9  32.6   Platelets 150 - 400 K/uL 224  240  276       Latest Ref Rng & Units 01/16/2022    5:31 AM 01/16/2022    2:01 AM 01/15/2022   10:06 PM  CMP  Glucose 70 - 99 mg/dL 201     BUN 6 - 20 mg/dL 34     Creatinine 0.44 - 1.00 mg/dL 2.96     Sodium 135 - 145 mmol/L 136     Potassium 3.5 - 5.1 mmol/L 5.2  5.2  5.4   Chloride 98 - 111 mmol/L 108     CO2 22 - 32 mmol/L 22     Calcium 8.9 - 10.3 mg/dL 8.6     Total Protein 6.5 - 8.1 g/dL 6.5     Total Bilirubin 0.3 - 1.2 mg/dL 0.2     Alkaline Phos 38 - 126 U/L 127     AST 15 - 41 U/L 13     ALT 0 - 44 U/L 18          Micro Results No results found for this or any previous visit (from the past 240 hour(s)).  Radiology Reports No results found.  SIGNED: Deatra James, MD, FHM. [facility],  Pager (please use [email or web address] to page/text) Please use Epic Secure Chat for non-urgent communication (7AM-7PM)  If 7PM-7AM, please contact night-coverage [email or web address], 01/16/2022, 10:57 AM

## 2022-01-16 NOTE — Progress Notes (Signed)
Patient states that she had a large loose BM this am after breakfast and requesting something for this. Notified Dr. Roger Shelter and no new orders given at this time. Will continue to monitor.

## 2022-01-16 NOTE — Assessment & Plan Note (Addendum)
-   Improving, creatinine increased from 1.88>> 3.36 >> 2.96 >> 2.16 today  - With associated hyperkalemia 5.2 >> still 5.2 >> 5.3  - Unclear etiology, no change in urine output, denies chronic NSAID use, denies new medications - Hold ARB and HCTZ - Hold Protonix - NS 500 bolus--continue maintenance fluids gentle hydration  - Ultrasound renal: IMPRESSION: 1. The visualized left ureter is mildly distended, which could be physiologic. No stones are visualized. A left ureteral jet was seen at the bladder. 2. Otherwise, no evidence of obstructive uropathy. 3. Incidentally noted 1.3 cm focus of increased echogenicity within the cortex of the left kidney at the lower pole either a parenchymal calcification or focus of intraparenchymal fat. - Trending and monitoring kidney function closely along with electrolytes

## 2022-01-16 NOTE — Hospital Course (Signed)
[name] is a 56 y.o. 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 Presents ED with a chief complaint of lower extremity edema.   Patient reports that 3 or 4 days ago she started having left lower extremity edema.  Its been worse since it started.  It is not painful because she is numb in her lower extremity secondary to neuropathy.  She denies any new weakness in the left leg.  She has not seen any weeping, oozing, drainage from the leg.  She had something similar happen years ago, but she cannot remember what the diagnosis was.  She does not think she is ever had a blood clot.  Patient does report dyspnea that started today.  She denies cough and fever.  She denies injury to the leg.  Patient ambulates mostly with a wheelchair.  Patient reports she has chest pain in the center of her chest that feels like pressure.  Started today.  Worse with exertion.  Her left shoulder hurts as well.  The pain in her left shoulder feels like a soreness.  No radiation to her neck.  No palpitations.  Patient did feel lightheaded when getting out of the shower today.  The lightheaded feeling went away upon drinking water.  Patient reports she has had polyphasia recently.  No polyuria or polydipsia.  Patient is vaccinated for COVID.  Patient is full code.   Patient asked to be evaluated for tapeworm.  She has had no weight loss.  History is not consistent with tapeworm.  No further work-up indicated.  ED Temp 98, heart rate 74-85, respiratory rate 17, blood pressure 97/57-135/75, satting 99% No leukocytosis and white blood cell count 9.9, hemoglobin 9.9 down from 10.6 Chemistry shows a potassium of 5.2, bicarb 19, BUN elevated 37, creatinine elevated 3.36 No interventions in the ED and admission requested for AKI

## 2022-01-17 ENCOUNTER — Inpatient Hospital Stay (HOSPITAL_COMMUNITY): Payer: Medicare Other

## 2022-01-17 ENCOUNTER — Ambulatory Visit: Payer: Medicare Other | Admitting: Internal Medicine

## 2022-01-17 DIAGNOSIS — N179 Acute kidney failure, unspecified: Secondary | ICD-10-CM | POA: Diagnosis not present

## 2022-01-17 LAB — COMPREHENSIVE METABOLIC PANEL
ALT: 17 U/L (ref 0–44)
AST: 14 U/L — ABNORMAL LOW (ref 15–41)
Albumin: 3.4 g/dL — ABNORMAL LOW (ref 3.5–5.0)
Alkaline Phosphatase: 119 U/L (ref 38–126)
Anion gap: 4 — ABNORMAL LOW (ref 5–15)
BUN: 28 mg/dL — ABNORMAL HIGH (ref 6–20)
CO2: 23 mmol/L (ref 22–32)
Calcium: 8.9 mg/dL (ref 8.9–10.3)
Chloride: 106 mmol/L (ref 98–111)
Creatinine, Ser: 2.16 mg/dL — ABNORMAL HIGH (ref 0.44–1.00)
GFR, Estimated: 26 mL/min — ABNORMAL LOW (ref >60)
Glucose, Bld: 166 mg/dL — ABNORMAL HIGH (ref 70–99)
Potassium: 5.3 mmol/L — ABNORMAL HIGH (ref 3.5–5.1)
Sodium: 133 mmol/L — ABNORMAL LOW (ref 135–145)
Total Bilirubin: 0.5 mg/dL (ref 0.3–1.2)
Total Protein: 6.7 g/dL (ref 6.5–8.1)

## 2022-01-17 LAB — GLUCOSE, CAPILLARY
Glucose-Capillary: 148 mg/dL — ABNORMAL HIGH (ref 70–99)
Glucose-Capillary: 188 mg/dL — ABNORMAL HIGH (ref 70–99)
Glucose-Capillary: 126 mg/dL — ABNORMAL HIGH (ref 70–99)
Glucose-Capillary: 150 mg/dL — ABNORMAL HIGH (ref 70–99)

## 2022-01-17 MED ORDER — SODIUM ZIRCONIUM CYCLOSILICATE 5 G PO PACK
5.0000 g | PACK | Freq: Once | ORAL | Status: AC
  Administered 2022-01-17: 5 g via ORAL
  Filled 2022-01-17: qty 1

## 2022-01-17 MED ORDER — FLORANEX PO PACK
1.0000 g | PACK | Freq: Three times a day (TID) | ORAL | Status: DC
  Administered 2022-01-17 – 2022-01-18 (×3): 1 g via ORAL
  Filled 2022-01-17 (×7): qty 1

## 2022-01-17 MED ORDER — GABAPENTIN 100 MG PO CAPS
200.0000 mg | ORAL_CAPSULE | Freq: Every day | ORAL | Status: DC
  Administered 2022-01-18: 200 mg via ORAL
  Filled 2022-01-17 (×2): qty 2

## 2022-01-17 MED ORDER — SODIUM CHLORIDE 0.9 % IV SOLN: INTRAVENOUS | Status: AC

## 2022-01-17 MED ORDER — BUPROPION HCL ER (XL) 300 MG PO TB24
300.0000 mg | ORAL_TABLET | Freq: Every day | ORAL | Status: DC
  Administered 2022-01-18: 300 mg via ORAL
  Filled 2022-01-17 (×2): qty 1

## 2022-01-17 MED ORDER — BUPROPION HCL ER (XL) 150 MG PO TB24: 150.0000 mg | ORAL_TABLET | Freq: Every day | ORAL | Status: DC

## 2022-01-17 NOTE — Progress Notes (Signed)
  Transition of Care [facility]) Screening Note   Patient Details  Name: [name] Date of Birth: 08-03-1965   Transition of Care [facility]) CM/SW Contact:    Iona Beard, [place] Phone Number: 01/17/2022, 11:50 AM    Transition of Care Department [facility]) has reviewed patient and no TOC needs have been identified at this time. We will continue to monitor patient advancement through interdisciplinary progression rounds. If new patient transition needs arise, please place a TOC consult.

## 2022-01-17 NOTE — Progress Notes (Signed)
Pt on and off sleep today.nad. has been a/o. Denied pain. Walks to bathroom with one assist.

## 2022-01-17 NOTE — Progress Notes (Signed)
PROGRESS NOTE    Patient: [name]                            PCP: Lindell Spar, MD                    DOB: 01/23/1966            DOA: 01/15/2022 [id number]:[id number]             DOS: 01/17/2022, 10:27 AM   LOS: 1 day   Date of Service: The patient was seen and examined on 01/17/2022  Subjective:   Patient was seen and examined this morning, stable no acute distress No issues overnight BM loose stool x2 past 24 hours Denies any fever chills nausea vomiting  Brief Narrative:   [name] is a 56 y.o. 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 Presents ED with a chief complaint of lower extremity edema.   Patient reports that 3 or 4 days ago she started having left lower extremity edema.  Its been worse since it started.  It is not painful because she is numb in her lower extremity secondary to neuropathy.  She denies any new weakness in the left leg.  She has not seen any weeping, oozing, drainage from the leg.  She had something similar happen years ago, but she cannot remember what the diagnosis was.  She does not think she is ever had a blood clot.  Patient does report dyspnea that started today.  She denies cough and fever.  She denies injury to the leg.  Patient ambulates mostly with a wheelchair.  Patient reports she has chest pain in the center of her chest that feels like pressure.  Started today.  Worse with exertion.  Her left shoulder hurts as well.  The pain in her left shoulder feels like a soreness.  No radiation to her neck.  No palpitations.  Patient did feel lightheaded when getting out of the shower today.  The lightheaded feeling went away upon drinking water.  Patient reports she has had polyphasia recently.  No polyuria or polydipsia.  Patient is vaccinated for COVID.  Patient is full code.   Patient asked to be evaluated for  tapeworm.  She has had no weight loss.  History is not consistent with tapeworm.  No further work-up indicated.  ED Temp 98, heart rate 74-85, respiratory rate 17, blood pressure 97/57-135/75, satting 99% No leukocytosis and white blood cell count 9.9, hemoglobin 9.9 down from 10.6 Chemistry shows a potassium of 5.2, bicarb 19, BUN elevated 37, creatinine elevated 3.36 No interventions in the ED and admission requested for AKI    Assessment & Plan:   Principal Problem:   AKI (acute kidney injury) ([place]) Active Problems:   Hyperkalemia   Type 2 diabetes mellitus with neurological complications (HCC)   Essential hypertension   Schizoaffective disorder (HCC)   GERD (gastroesophageal reflux disease)   Lower extremity edema   Tobacco use disorder     Assessment and Plan: * AKI (acute kidney injury) ([place]) - Improving, creatinine increased from 1.88>> 3.36 >> 2.96 >> 2.16 today  - With associated hyperkalemia 5.2 >> still 5.2 >> 5.3  - Unclear etiology, no change in urine output, denies chronic NSAID use, denies new medications - Hold ARB and HCTZ - Hold Protonix - NS 500 bolus--continue  maintenance fluids gentle hydration  - Ultrasound renal: IMPRESSION: 1. The visualized left ureter is mildly distended, which could be physiologic. No stones are visualized. A left ureteral jet was seen at the bladder. 2. Otherwise, no evidence of obstructive uropathy. 3. Incidentally noted 1.3 cm focus of increased echogenicity within the cortex of the left kidney at the lower pole either a parenchymal calcification or focus of intraparenchymal fat. - Trending and monitoring kidney function closely along with electrolytes  Hyperkalemia Potassium minimally elevated at 5.2 >>> 5.2, 5.3 this morning - Lokelma was given initially dose will be given today 01/17/2022 -Monitoring potassium level closely, -Could be contributory from AKI,  -Continue gentle IV fluid hydration with normal  saline   Tobacco use disorder - Started nicotine patch - Counseled on importance of cessation  Lower extremity edema - Holding Lasix in the setting of AKI - Edema worse on the left than the right - Ultrasound ruled out DVT - Continue to monitor  GERD (gastroesophageal reflux disease) - We will continue holding Protonix in the setting of AKI  Schizoaffective disorder (HCC) - Continue Abilify, trazodone, Wellbutrin  Essential hypertension - We will continue  holding ARB and HCTZ secondary to AKI - Continue metoprolol -Blood pressure remained stable,   Type 2 diabetes mellitus with neurological complications (HCC) - Checking CBG q. ACHS, SSI coverage - 30 units of basal insulin at baseline - Continue 20 units of basal insulin and sliding scale coverage - Carb modified diet - A1c: CBG (last 3)  Recent Labs    01/16/22 1605 01/16/22 1942 01/17/22 0714  GLUCAP 137* 169* 148*     - Continue to monitor     ----------------------------------------------------------------------------------------------------------------------------------------------- Nutritional status:  The patient's BMI is: Body mass index is 37.45 kg/m. I agree with the assessment and plan as outlined ---------------------------------------------------------------------------------------------------------------------------------------------  DVT prophylaxis:  heparin injection 5,000 Units Start: 01/16/22 0100 SCDs Start: 01/16/22 0008   Code Status:   Code Status: Full Code  Family Communication: No family member present at bedside- attempt will be made to update daily The above findings and plan of care has been discussed with patient (and family)  in detail,  they expressed understanding and agreement of above. -Advance care planning has been discussed.   Admission status:   Status is: Inpatient Remains inpatient appropriate because: Needing close monitoring IV fluids, treating AKI, and  hyperkalemia     Procedures:   No admission procedures for hospital encounter.   Antimicrobials:  Anti-infectives (From admission, onward)    None        Medication:   ARIPiprazole  15 mg Oral Daily   atorvastatin  40 mg Oral Daily   buPROPion  300 mg Oral Daily   colestipol  2 g Oral BID   gabapentin  300 mg Oral Daily   heparin  5,000 Units Subcutaneous Q8H   insulin aspart  0-15 Units Subcutaneous TID WC   insulin aspart  0-5 Units Subcutaneous QHS   insulin detemir  20 Units Subcutaneous QHS   lactobacillus  1 g Oral TID WC   metoprolol succinate  50 mg Oral Daily   nicotine  21 mg Transdermal Daily   sodium zirconium cyclosilicate  5 g Oral Once    acetaminophen **OR** acetaminophen, loperamide, morphine injection, ondansetron **OR** ondansetron (ZOFRAN) IV, oxyCODONE, traZODone   Objective:   Vitals:   01/16/22 0715 01/16/22 1300 01/16/22 1942 01/17/22 0525  BP: 123/60 127/71 109/60 134/71  Pulse: 76 72 65 72  Resp: 18 20  18 18  Temp: 98.1 F (36.7 C) 98.1 F (36.7 C) 98.4 F (36.9 C) 97.7 F (36.5 C)  TempSrc:  Oral    SpO2: 98% 100% 95% 98%  Weight:      Height:        Intake/Output Summary (Last 24 hours) at 01/17/2022 1027 Last data filed at 01/16/2022 1824 Gross per 24 hour  Intake 752.75 ml  Output --  Net 752.75 ml   Filed Weights   01/15/22 1945 01/15/22 2303  Weight: 113.4 kg 118.4 kg     Examination:    General:  AAO x 3,  cooperative, no distress;   HEENT:  Normocephalic, PERRL, otherwise with in Normal limits   Neuro:  CNII-XII intact. , normal motor and sensation, reflexes intact   Lungs:   Clear to auscultation BL, Respirations unlabored,  No wheezes / crackles  Cardio:    S1/S2, RRR, No murmure, No Rubs or Gallops   Abdomen:  Soft, non-tender, bowel sounds active all four quadrants, no guarding or peritoneal signs.  Muscular  skeletal:  Limited exam -global generalized weaknesses - in bed, able to move all 4  extremities,   2+ pulses,  symmetric, No pitting edema  Skin:  Dry, warm to touch, negative for any Rashes,  Wounds: Please see nursing documentation         ------------------------------------------------------------------------------------------------------------------------------------------    LABs:     Latest Ref Rng & Units 01/16/2022    5:31 AM 01/15/2022    8:52 PM 11/23/2021    1:38 PM  CBC  WBC 4.0 - 10.5 K/uL 8.4  9.9  11.8   Hemoglobin 12.0 - 15.0 g/dL 9.4  9.9  10.6   Hematocrit 36.0 - 46.0 % 30.3  30.9  32.6   Platelets 150 - 400 K/uL 224  240  276       Latest Ref Rng & Units 01/17/2022    5:21 AM 01/16/2022    5:31 AM 01/16/2022    2:01 AM  CMP  Glucose 70 - 99 mg/dL 166  201    BUN 6 - 20 mg/dL 28  34    Creatinine 0.44 - 1.00 mg/dL 2.16  2.96    Sodium 135 - 145 mmol/L 133  136    Potassium 3.5 - 5.1 mmol/L 5.3  5.2  5.2   Chloride 98 - 111 mmol/L 106  108    CO2 22 - 32 mmol/L 23  22    Calcium 8.9 - 10.3 mg/dL 8.9  8.6    Total Protein 6.5 - 8.1 g/dL 6.7  6.5    Total Bilirubin 0.3 - 1.2 mg/dL 0.5  0.2    Alkaline Phos 38 - 126 U/L 119  127    AST 15 - 41 U/L 14  13    ALT 0 - 44 U/L 17  18         Micro Results No results found for this or any previous visit (from the past 240 hour(s)).  Radiology Reports US RENAL  Result Date: 01/17/2022 CLINICAL DATA:  Acute kidney injury EXAM: RENAL / URINARY TRACT ULTRASOUND COMPLETE COMPARISON:  CT 05/09/2020 FINDINGS: Right Kidney: Renal measurements: 12.0 x 5.1 x 5.5 cm = volume: 177 mL. Echogenicity within normal limits. No mass, shadowing stone, or hydronephrosis visualized. Left Kidney: Renal measurements: 13.4 x 6.3 x 7.6 cm = volume: 336 mL. Echogenicity within normal limits. 1.3 cm focus of increased echogenicity within the cortex of the left kidney at the lower  pole either a parenchymal calcification or focus of intraparenchymal fat. No mass, shadowing stone, or hydronephrosis visualized. The  visualized left ureter is mildly distended. Bladder: Appears normal for degree of bladder distention. Bilateral ureteral jets visualized. Other: None. IMPRESSION: 1. The visualized left ureter is mildly distended, which could be physiologic. No stones are visualized. A left ureteral jet was seen at the bladder. 2. Otherwise, no evidence of obstructive uropathy. 3. Incidentally noted 1.3 cm focus of increased echogenicity within the cortex of the left kidney at the lower pole either a parenchymal calcification or focus of intraparenchymal fat. Electronically Signed   By: Davina Poke D.O.   On: 01/17/2022 09:34   US Venous Img Lower Bilateral (DVT)  Result Date: 01/17/2022 CLINICAL DATA:  56 year old female with leg edema EXAM: BILATERAL LOWER EXTREMITY VENOUS DOPPLER ULTRASOUND TECHNIQUE: Gray-scale sonography with graded compression, as well as color Doppler and duplex ultrasound were performed to evaluate the lower extremity deep venous systems from the level of the common femoral vein and including the common femoral, femoral, profunda femoral, popliteal and calf veins including the posterior tibial, peroneal and gastrocnemius veins when visible. The superficial great saphenous vein was also interrogated. Spectral Doppler was utilized to evaluate flow at rest and with distal augmentation maneuvers in the common femoral, femoral and popliteal veins. COMPARISON:  None Available. FINDINGS: RIGHT LOWER EXTREMITY Common Femoral Vein: No evidence of thrombus. Normal compressibility, respiratory phasicity and response to augmentation. Saphenofemoral Junction: No evidence of thrombus. Normal compressibility and flow on color Doppler imaging. Profunda Femoral Vein: No evidence of thrombus. Normal compressibility and flow on color Doppler imaging. Femoral Vein: No evidence of thrombus. Normal compressibility, respiratory phasicity and response to augmentation. Popliteal Vein: No evidence of thrombus. Normal  compressibility, respiratory phasicity and response to augmentation. Calf Veins: No evidence of thrombus. Normal compressibility and flow on color Doppler imaging. Superficial Great Saphenous Vein: No evidence of thrombus. Normal compressibility and flow on color Doppler imaging. Other Findings:  None. LEFT LOWER EXTREMITY Common Femoral Vein: No evidence of thrombus. Normal compressibility, respiratory phasicity and response to augmentation. Saphenofemoral Junction: No evidence of thrombus. Normal compressibility and flow on color Doppler imaging. Profunda Femoral Vein: No evidence of thrombus. Normal compressibility and flow on color Doppler imaging. Femoral Vein: No evidence of thrombus. Normal compressibility, respiratory phasicity and response to augmentation. Popliteal Vein: No evidence of thrombus. Normal compressibility, respiratory phasicity and response to augmentation. Calf Veins: No evidence of thrombus. Normal compressibility and flow on color Doppler imaging. Superficial Great Saphenous Vein: No evidence of thrombus. Normal compressibility and flow on color Doppler imaging. Other Findings:  None. IMPRESSION: Directed duplex of the bilateral lower extremity negative for DVT Signed, Dulcy Fanny. Nadene Rubins, RPVI [facility] Radiology Electronically Signed   By: Corrie Mckusick D.O.   On: 01/17/2022 09:32    SIGNED: Deatra James, MD, FHM. [facility],  Pager (please use [email or web address] to page/text) Please use Epic Secure Chat for non-urgent communication (7AM-7PM)  If 7PM-7AM, please contact night-coverage [email or web address], 01/17/2022, 10:27 AM

## 2022-01-17 NOTE — Progress Notes (Signed)
Right arm where iv is with tight mild swelling. Flushed well but no blood return. Iv dc and another started. Nad. Pt denies pain unless pushed on. Pulses wnl.

## 2022-01-18 DIAGNOSIS — N179 Acute kidney failure, unspecified: Secondary | ICD-10-CM | POA: Diagnosis not present

## 2022-01-18 LAB — COMPREHENSIVE METABOLIC PANEL
ALT: 17 U/L (ref 0–44)
AST: 14 U/L — ABNORMAL LOW (ref 15–41)
Albumin: 3.6 g/dL (ref 3.5–5.0)
Alkaline Phosphatase: 127 U/L — ABNORMAL HIGH (ref 38–126)
Anion gap: 6 (ref 5–15)
BUN: 27 mg/dL — ABNORMAL HIGH (ref 6–20)
CO2: 20 mmol/L — ABNORMAL LOW (ref 22–32)
Calcium: 9.2 mg/dL (ref 8.9–10.3)
Chloride: 109 mmol/L (ref 98–111)
Creatinine, Ser: 2.05 mg/dL — ABNORMAL HIGH (ref 0.44–1.00)
GFR, Estimated: 28 mL/min — ABNORMAL LOW (ref >60)
Glucose, Bld: 110 mg/dL — ABNORMAL HIGH (ref 70–99)
Potassium: 5.1 mmol/L (ref 3.5–5.1)
Sodium: 135 mmol/L (ref 135–145)
Total Bilirubin: 0.6 mg/dL (ref 0.3–1.2)
Total Protein: 7.4 g/dL (ref 6.5–8.1)

## 2022-01-18 LAB — GLUCOSE, CAPILLARY
Glucose-Capillary: 119 mg/dL — ABNORMAL HIGH (ref 70–99)
Glucose-Capillary: 186 mg/dL — ABNORMAL HIGH (ref 70–99)

## 2022-01-18 MED ORDER — FUROSEMIDE 20 MG PO TABS: 20.0000 mg | ORAL_TABLET | Freq: Every day | ORAL | 11 refills | Status: DC

## 2022-01-18 MED ORDER — NICOTINE 21 MG/24HR TD PT24: 21.0000 mg | MEDICATED_PATCH | Freq: Every day | TRANSDERMAL | 0 refills | Status: DC

## 2022-01-18 NOTE — Plan of Care (Signed)

## 2022-01-18 NOTE — Care Management Important Message (Signed)
Important Message  Patient Details  Name: [name] MRN: [id number] Date of Birth: [birth year]   Medicare Important Message Given:  N/A - LOS <3 / Initial given by admissions     Tommy Medal 01/18/2022, 11:17 AM

## 2022-01-18 NOTE — Plan of Care (Signed)
Pt alert and oriented x 4. Up with walker with standby assist to bathroom. Edema noted to LLE. Abd soft. Bsx4. Vitals stable. BS 150 at hs. Pt had snack with insulin. Tylenol given x 1 for right arm pain. Pt reported it was from previous IV stick no redness noted.  Problem: Education: Goal: Ability to describe self-care measures that may prevent or decrease complications (Diabetes Survival Skills Education) will improve Outcome: Progressing Goal: Individualized Educational Video(s) Outcome: Progressing   Problem: Coping: Goal: Ability to adjust to condition or change in health will improve Outcome: Progressing   Problem: Fluid Volume: Goal: Ability to maintain a balanced intake and output will improve Outcome: Progressing   Problem: Health Behavior/Discharge Planning: Goal: Ability to identify and utilize available resources and services will improve Outcome: Progressing Goal: Ability to manage health-related needs will improve Outcome: Progressing   Problem: Metabolic: Goal: Ability to maintain appropriate glucose levels will improve Outcome: Progressing   Problem: Nutritional: Goal: Maintenance of adequate nutrition will improve Outcome: Progressing Goal: Progress toward achieving an optimal weight will improve Outcome: Progressing   Problem: Skin Integrity: Goal: Risk for impaired skin integrity will decrease Outcome: Progressing   Problem: Tissue Perfusion: Goal: Adequacy of tissue perfusion will improve Outcome: Progressing   Problem: Education: Goal: Knowledge of General Education information will improve Description: Including pain rating scale, medication(s)/side effects and non-pharmacologic comfort measures Outcome: Progressing   Problem: Health Behavior/Discharge Planning: Goal: Ability to manage health-related needs will improve Outcome: Progressing   Problem: Clinical Measurements: Goal: Ability to maintain clinical measurements within normal limits will  improve Outcome: Progressing Goal: Will remain free from infection Outcome: Progressing Goal: Diagnostic test results will improve Outcome: Progressing Goal: Respiratory complications will improve Outcome: Progressing Goal: Cardiovascular complication will be avoided Outcome: Progressing   Problem: Activity: Goal: Risk for activity intolerance will decrease Outcome: Progressing   Problem: Nutrition: Goal: Adequate nutrition will be maintained Outcome: Progressing   Problem: Coping: Goal: Level of anxiety will decrease Outcome: Progressing   Problem: Elimination: Goal: Will not experience complications related to bowel motility Outcome: Progressing Goal: Will not experience complications related to urinary retention Outcome: Progressing   Problem: Pain Managment: Goal: General experience of comfort will improve Outcome: Progressing   Problem: Safety: Goal: Ability to remain free from injury will improve Outcome: Progressing   Problem: Skin Integrity: Goal: Risk for impaired skin integrity will decrease Outcome: Progressing

## 2022-01-18 NOTE — Discharge Summary (Signed)
Physician Discharge Summary   Patient: [name] MRN: [id number] DOB: 1965/06/05  Admit date:     01/15/2022  Discharge date: 01/18/22  Discharge Physician: Deatra James   PCP: Lindell Spar, MD   Recommendations at discharge:    F/up with PCP in 1 Week  Monitor Potassium and kidney function with PCP BMP in one week - results to PCP  Discharge Diagnoses: Principal Problem:   AKI (acute kidney injury) ([place]) Active Problems:   Hyperkalemia   Type 2 diabetes mellitus with neurological complications ([place])   Essential hypertension   Schizoaffective disorder ([place])   GERD (gastroesophageal reflux disease)   Lower extremity edema   Tobacco use disorder  Resolved Problems:   * No resolved hospital problems. *  Hospital Course: [name] is a 56 y.o. 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 Presents ED with a chief complaint of lower extremity edema.   Patient reports that 3 or 4 days ago she started having left lower extremity edema.  Its been worse since it started.  It is not painful because she is numb in her lower extremity secondary to neuropathy.  She denies any new weakness in the left leg.  She has not seen any weeping, oozing, drainage from the leg.  She had something similar happen years ago, but she cannot remember what the diagnosis was.  She does not think she is ever had a blood clot.  Patient does report dyspnea that started today.  She denies cough and fever.  She denies injury to the leg.  Patient ambulates mostly with a wheelchair.  Patient reports she has chest pain in the center of her chest that feels like pressure.  Started today.  Worse with exertion.  Her left shoulder hurts as well.  The pain in her left shoulder feels like a soreness.  No radiation to her neck.  No palpitations.  Patient did feel lightheaded  when getting out of the shower today.  The lightheaded feeling went away upon drinking water.  Patient reports she has had polyphasia recently.  No polyuria or polydipsia.  Patient is vaccinated for COVID.  Patient is full code.   Patient asked to be evaluated for tapeworm.  She has had no weight loss.  History is not consistent with tapeworm.  No further work-up indicated.  ED Temp 98, heart rate 74-85, respiratory rate 17, blood pressure 97/57-135/75, satting 99% No leukocytosis and white blood cell count 9.9, hemoglobin 9.9 down from 10.6 Chemistry shows a potassium of 5.2, bicarb 19, BUN elevated 37, creatinine elevated 3.36 No interventions in the ED and admission requested for AKI  Assessment and Plan: * AKI (acute kidney injury) ([place]) - Improving, creatinine increased from 1.88>> 3.36 >> 2.96 >> 2.16 >>2.05today  - With associated hyperkalemia 5.2 >> still 5.2 >> 5.3 >>> 5.1 - Unclear etiology, no change in urine output, denies chronic NSAID use, denies new medications - D/Ced ARB and HCTZ - Hold Protonix - S/P IVF: NS 500 bolus--c maintenance fluids gentle hydration  - Ultrasound renal: IMPRESSION: 1. The visualized left ureter is mildly distended, which could be physiologic. No stones are visualized. A left ureteral jet was seen at the bladder. 2. Otherwise, no evidence of obstructive uropathy. 3. Incidentally noted 1.3 cm focus of increased echogenicity within the cortex of the left kidney at the lower pole either a parenchymal calcification or focus of intraparenchymal  fat. - Trending and monitoring kidney function closely along with electrolytes  Hyperkalemia Potassium minimally elevated at 5.2 >>> 5.2, 5.3 >>>5.1  this morning - 5 mg Lokelma was given -Monitoring potassium level closely,    Tobacco use disorder - Started nicotine patch - Counseled on importance of cessation  Lower extremity edema - was Holding Lasix in the setting of AKI,res summed a lower dose 20  mg daily  - Edema worse on the left than the right - Ultrasound ruled out DVT   GERD (gastroesophageal reflux disease) - Hold Protonix in the setting of AKI  Schizoaffective disorder (H[facility]) - Continue Abilify, trazodone, Wellbutrin  Essential hypertension - Stable BP  - D/Ced  ARB and HCTZ secondary to AKI - Continue metoprolol  Type 2 diabetes mellitus with neurological complications ([facility]) - Checking CBG q. ACHS, SSI coverage - 30 units of basal insulin at baseline and Metformin  - F/up with PCP --- needed adjusting Hypoglycimic agents   - Carb modified diet - A1c:8.1 CBG (last 3)  Recent Labs    01/17/22 1106 01/17/22 1612 01/17/22 2041  GLUCAP 188* 126* 150*          Consultants: Noen Procedures performed: renal US   Disposition: Home Diet recommendation:  Discharge Diet Orders (From admission, onward)     Start     Ordered   01/18/22 0000  Diet - low sodium heart healthy        01/18/22 0928           Carb modified diet DISCHARGE MEDICATION: Allergies as of 01/18/2022   No Known Allergies      Medication List     STOP taking these medications    hydrochlorothiazide 12.5 MG tablet Commonly known as: HYDRODIURIL   losartan-hydrochlorothiazide 50-12.5 MG tablet Commonly known as: HYZAAR       TAKE these medications    acetaminophen 325 MG tablet Commonly known as: TYLENOL Take 2 tablets (650 mg total) by mouth every 6 (six) hours as needed for mild pain.   ARIPiprazole 15 MG tablet Commonly known as: ABILIFY Take 1 tablet (15 mg total) by mouth daily.   atorvastatin 40 MG tablet Commonly known as: LIPITOR Take 1 tablet (40 mg total) by mouth daily.   buPROPion 300 MG 24 hr tablet Commonly known as: WELLBUTRIN XL TAKE 1 TABLET BY MOUTH EVERY MORNING What changed:  how much to take how to take this when to take this additional instructions   colestipol 1 g tablet Commonly known as: COLESTID TAKE TWO (2) TABLETS BY MOUTH  TWICE DAILY What changed:  how much to take how to take this when to take this additional instructions   Dexcom G7 Sensor Misc Please use it to check blood glucose 3 times before meals, at bedtime and as needed.   furosemide 20 MG tablet Commonly known as: Lasix Take 1 tablet (20 mg total) by mouth daily. What changed:  medication strength See the new instructions.   gabapentin 300 MG capsule Commonly known as: NEURONTIN Take 1 capsule (300 mg total) by mouth at bedtime. What changed: when to take this   ibuprofen 200 MG tablet Commonly known as: ADVIL Take 400 mg by mouth every 6 (six) hours as needed for mild pain, headache or fever.   Lantus SoloStar 100 UNIT/ML Solostar Pen Generic drug: insulin glargine INJECT 30 UNITS INTO THE SKIN ONCE DAILY AT BEDTIME What changed: See the new instructions.   metFORMIN 1000 MG tablet Commonly known as: GLUCOPHAGE  Take 1 tablet (1,000 mg total) by mouth 2 (two) times daily with a meal.   metoprolol succinate 50 MG 24 hr tablet Commonly known as: TOPROL-XL Take 1 tablet (50 mg total) by mouth daily. Take with or immediately following a meal.   nicotine 21 mg/24hr patch Commonly known as: NICODERM CQ - dosed in mg/24 hours Place 1 patch (21 mg total) onto the skin daily. Start taking on: January 19, 2022   pantoprazole 40 MG tablet Commonly known as: PROTONIX TAKE (1) TABLET BY MOUTH ONCE DAILY. What changed: See the new instructions.   traZODone 50 MG tablet Commonly known as: DESYREL Take 1 tablet (50 mg total) by mouth at bedtime as needed. for sleep What changed:  reasons to take this additional instructions        Discharge Exam: Filed Weights   01/15/22 1945 01/15/22 2303  Weight: 113.4 kg 118.4 kg      Physical Exam:   General:  AAO x 3,  cooperative, no distress;   HEENT:  Normocephalic, PERRL, otherwise with in Normal limits   Neuro:  CNII-XII intact. , normal motor and sensation, reflexes intact    Lungs:   Clear to auscultation BL, Respirations unlabored,  No wheezes / crackles  Cardio:    S1/S2, RRR, No murmure, No Rubs or Gallops   Abdomen:  Soft, non-tender, bowel sounds active all four quadrants, no guarding or peritoneal signs.  Muscular  skeletal:  Limited exam -global generalized weaknesses - in bed, able to move all 4 extremities,   2+ pulses,  symmetric, +1 pitting edema  Skin:  Dry, warm to touch, negative for any Rashes,  Wounds: Please see nursing documentation          Condition at discharge: good  The results of significant diagnostics from this hospitalization (including imaging, microbiology, ancillary and laboratory) are listed below for reference.   Imaging Studies: US RENAL  Result Date: 01/17/2022 CLINICAL DATA:  Acute kidney injury EXAM: RENAL / URINARY TRACT ULTRASOUND COMPLETE COMPARISON:  CT 05/09/2020 FINDINGS: Right Kidney: Renal measurements: 12.0 x 5.1 x 5.5 cm = volume: 177 mL. Echogenicity within normal limits. No mass, shadowing stone, or hydronephrosis visualized. Left Kidney: Renal measurements: 13.4 x 6.3 x 7.6 cm = volume: 336 mL. Echogenicity within normal limits. 1.3 cm focus of increased echogenicity within the cortex of the left kidney at the lower pole either a parenchymal calcification or focus of intraparenchymal fat. No mass, shadowing stone, or hydronephrosis visualized. The visualized left ureter is mildly distended. Bladder: Appears normal for degree of bladder distention. Bilateral ureteral jets visualized. Other: None. IMPRESSION: 1. The visualized left ureter is mildly distended, which could be physiologic. No stones are visualized. A left ureteral jet was seen at the bladder. 2. Otherwise, no evidence of obstructive uropathy. 3. Incidentally noted 1.3 cm focus of increased echogenicity within the cortex of the left kidney at the lower pole either a parenchymal calcification or focus of intraparenchymal fat. Electronically Signed    By: Davina Poke D.O.   On: 01/17/2022 09:34   US Venous Img Lower Bilateral (DVT)  Result Date: 01/17/2022 CLINICAL DATA:  56 year old female with leg edema EXAM: BILATERAL LOWER EXTREMITY VENOUS DOPPLER ULTRASOUND TECHNIQUE: Gray-scale sonography with graded compression, as well as color Doppler and duplex ultrasound were performed to evaluate the lower extremity deep venous systems from the level of the common femoral vein and including the common femoral, femoral, profunda femoral, popliteal and calf veins including the posterior tibial, peroneal  and gastrocnemius veins when visible. The superficial great saphenous vein was also interrogated. Spectral Doppler was utilized to evaluate flow at rest and with distal augmentation maneuvers in the common femoral, femoral and popliteal veins. COMPARISON:  None Available. FINDINGS: RIGHT LOWER EXTREMITY Common Femoral Vein: No evidence of thrombus. Normal compressibility, respiratory phasicity and response to augmentation. Saphenofemoral Junction: No evidence of thrombus. Normal compressibility and flow on color Doppler imaging. Profunda Femoral Vein: No evidence of thrombus. Normal compressibility and flow on color Doppler imaging. Femoral Vein: No evidence of thrombus. Normal compressibility, respiratory phasicity and response to augmentation. Popliteal Vein: No evidence of thrombus. Normal compressibility, respiratory phasicity and response to augmentation. Calf Veins: No evidence of thrombus. Normal compressibility and flow on color Doppler imaging. Superficial Great Saphenous Vein: No evidence of thrombus. Normal compressibility and flow on color Doppler imaging. Other Findings:  None. LEFT LOWER EXTREMITY Common Femoral Vein: No evidence of thrombus. Normal compressibility, respiratory phasicity and response to augmentation. Saphenofemoral Junction: No evidence of thrombus. Normal compressibility and flow on color Doppler imaging. Profunda Femoral Vein: No  evidence of thrombus. Normal compressibility and flow on color Doppler imaging. Femoral Vein: No evidence of thrombus. Normal compressibility, respiratory phasicity and response to augmentation. Popliteal Vein: No evidence of thrombus. Normal compressibility, respiratory phasicity and response to augmentation. Calf Veins: No evidence of thrombus. Normal compressibility and flow on color Doppler imaging. Superficial Great Saphenous Vein: No evidence of thrombus. Normal compressibility and flow on color Doppler imaging. Other Findings:  None. IMPRESSION: Directed duplex of the bilateral lower extremity negative for DVT Signed, Dulcy Fanny. Nadene Rubins, RPVI [facility] Electronically Signed   By: Corrie Mckusick D.O.   On: 01/17/2022 09:32    Microbiology: Results for orders placed or performed during the hospital encounter of 03/25/21  Resp Panel by RT-PCR (Flu A&B, Covid) Nasopharyngeal Swab     Status: None   Collection Time: 03/25/21  5:39 PM   Specimen: Nasopharyngeal Swab; Nasopharyngeal(NP) swabs in vial transport medium  Result Value Ref Range Status   SARS Coronavirus 2 by RT PCR NEGATIVE NEGATIVE Final    Comment: (NOTE) SARS-CoV-2 target nucleic acids are NOT DETECTED.  The SARS-CoV-2 RNA is generally detectable in upper respiratory specimens during the acute phase of infection. The lowest concentration of SARS-CoV-2 viral copies this assay can detect is 138 copies/mL. A negative result does not preclude SARS-Cov-2 infection and should not be used as the sole basis for treatment or other patient management decisions. A negative result may occur with  improper specimen collection/handling, submission of specimen other than nasopharyngeal swab, presence of viral mutation(s) within the areas targeted by this assay, and inadequate number of viral copies(<138 copies/mL). A negative result must be combined with clinical observations,  patient history, and epidemiological information. The expected result is Negative.  Fact Sheet for Patients:  EntrepreneurPulse.com.au  Fact Sheet for Healthcare Providers:  IncredibleEmployment.be  This test is no t yet approved or cleared by the [place] FDA and  has been authorized for detection and/or diagnosis of SARS-CoV-2 by FDA under an Emergency Use Authorization (EUA). This EUA will remain  in effect (meaning this test can be used) for the duration of the COVID-19 declaration under Section 564(b)(1) of the Act, 21 U.S.C.section 360bbb-3(b)(1), unless the authorization is terminated  or revoked sooner.       Influenza A by PCR NEGATIVE NEGATIVE Final   Influenza B by PCR NEGATIVE NEGATIVE Final  Comment: (NOTE) The Xpert Xpress SARS-CoV-2/FLU/RSV plus assay is intended as an aid in the diagnosis of influenza from Nasopharyngeal swab specimens and should not be used as a sole basis for treatment. Nasal washings and aspirates are unacceptable for Xpert Xpress SARS-CoV-2/FLU/RSV testing.  Fact Sheet for Patients: EntrepreneurPulse.com.au  Fact Sheet for Healthcare Providers: IncredibleEmployment.be  This test is not yet approved or cleared by the [place] FDA and has been authorized for detection and/or diagnosis of SARS-CoV-2 by FDA under an Emergency Use Authorization (EUA). This EUA will remain in effect (meaning this test can be used) for the duration of the COVID-19 declaration under Section 564(b)(1) of the Act, 21 U.S.C. section 360bbb-3(b)(1), unless the authorization is terminated or revoked.  Performed at [facility], [address], [place], [place] [address]   Urine Culture     Status: Abnormal   Collection Time: 03/25/21  9:06 PM   Specimen: Urine, Clean Catch  Result Value Ref Range Status   Specimen Description   Final    URINE, CLEAN CATCH Performed at [facility], [address] [place], [name] [address]    Special Requests   Final    NONE Performed at [facility], [address], [place], [place] [address]    Culture >=100,000 COLONIES/mL KLEBSIELLA PNEUMONIAE (A)  Final   Report Status 03/28/2021 FINAL  Final   Organism ID, Bacteria KLEBSIELLA PNEUMONIAE (A)  Final      Susceptibility   Klebsiella pneumoniae - MIC*    AMPICILLIN >=32 RESISTANT Resistant     CEFAZOLIN <=4 SENSITIVE Sensitive     CEFEPIME <=0.12 SENSITIVE Sensitive     CEFTRIAXONE <=0.25 SENSITIVE Sensitive     CIPROFLOXACIN <=0.25 SENSITIVE Sensitive     GENTAMICIN <=1 SENSITIVE Sensitive     IMIPENEM <=0.25 SENSITIVE Sensitive     NITROFURANTOIN 64 INTERMEDIATE Intermediate     TRIMETH/SULFA <=20 SENSITIVE Sensitive     AMPICILLIN/SULBACTAM 4 SENSITIVE Sensitive     PIP/TAZO <=4 SENSITIVE Sensitive     * >=100,000 COLONIES/mL KLEBSIELLA PNEUMONIAE    Labs: CBC: Recent Labs  Lab 01/15/22 2052 01/16/22 0531  WBC 9.9 8.4  NEUTROABS 6.1 4.7  HGB 9.9* 9.4*  HCT 30.9* 30.3*  MCV 93.9 95.0  PLT 240 967   Basic Metabolic Panel: Recent Labs  Lab 01/15/22 2052 01/15/22 2206 01/16/22 0201 01/16/22 0531 01/17/22 0521 01/18/22 0412  NA 136  --   --  136 133* 135  K 5.2* 5.4* 5.2* 5.2* 5.3* 5.1  CL 108  --   --  108 106 109  CO2 19*  --   --  22 23 20*  GLUCOSE 153*  --   --  201* 166* 110*  BUN 37*  --   --  34* 28* 27*  CREATININE 3.36*  --   --  2.96* 2.16* 2.05*  CALCIUM 8.8*  --   --  8.6* 8.9 9.2  MG  --   --   --  1.8  --   --    Liver Function Tests: Recent Labs  Lab 01/16/22 0531 01/17/22 0521 01/18/22 0412  AST 13* 14* 14*  ALT '18 17 17  '$ ALKPHOS 127* 119 127*  BILITOT 0.2* 0.5 0.6  PROT 6.5 6.7 7.4  ALBUMIN 3.3* 3.4* 3.6   CBG: Recent Labs  Lab 01/16/22 1942 01/17/22 0714 01/17/22 1106 01/17/22 1612 01/17/22 2041  GLUCAP 169* 148* 188* 126* 150*    Discharge time spent: greater than 30 minutes.  Signed: Deatra James,  MD [facility] 01/18/2022

## 2022-01-19 ENCOUNTER — Telehealth: Payer: Self-pay | Admitting: *Deleted

## 2022-01-19 NOTE — Telephone Encounter (Signed)
Transition Care Management Follow-up Telephone Call Date of discharge and from where: 01-18-22 [facility] How have you been since you were released from the hospital? Doing ok Any questions or concerns? No  Items Reviewed: Did the pt receive and understand the discharge instructions provided? Yes  Medications obtained and verified? Yes  Other? No  Any new allergies since your discharge? No  Dietary orders reviewed? Yes Do you have support at home? Yes   Home Care and Equipment/Supplies: Were home health services ordered? not applicable If so, what is the name of the agency? NA  Has the agency set up a time to come to the patient's home? not applicable Were any new equipment or medical supplies ordered?  No What is the name of the medical supply agency? NA Were you able to get the supplies/equipment? not applicable Do you have any questions related to the use of the equipment or supplies? No  Functional Questionnaire: (I = Independent and D = Dependent) ADLs: i  Bathing/Dressing- i  Meal Prep- i  Eating- i  Maintaining continence- i  Transferring/Ambulation- i  Managing Meds- i  Follow up appointments reviewed:  PCP Hospital f/u appt confirmed? Yes  Scheduled to see patel on 9-28 @ 1020. [facility] f/u appt confirmed? No   Are transportation arrangements needed? Yes  If their condition worsens, is the pt aware to call PCP or go to the Emergency Dept.? Yes Was the patient provided with contact information for the PCP's office or ED? Yes Was to pt encouraged to call back with questions or concerns? Yes

## 2022-01-24 ENCOUNTER — Inpatient Hospital Stay: Payer: Medicare Other | Admitting: Internal Medicine

## 2022-01-24 ENCOUNTER — Other Ambulatory Visit: Payer: Self-pay | Admitting: Internal Medicine

## 2022-01-27 ENCOUNTER — Encounter: Payer: Self-pay | Admitting: Internal Medicine

## 2022-01-27 ENCOUNTER — Ambulatory Visit (INDEPENDENT_AMBULATORY_CARE_PROVIDER_SITE_OTHER): Payer: Medicare Other | Admitting: Internal Medicine

## 2022-01-27 VITALS — BP 138/86 | HR 85 | Resp 16 | Ht 70.0 in | Wt 256.4 lb

## 2022-01-27 DIAGNOSIS — Z09 Encounter for follow-up examination after completed treatment for conditions other than malignant neoplasm: Secondary | ICD-10-CM | POA: Diagnosis not present

## 2022-01-27 DIAGNOSIS — N179 Acute kidney failure, unspecified: Secondary | ICD-10-CM

## 2022-01-27 DIAGNOSIS — M14672 Charcot's joint, left ankle and foot: Secondary | ICD-10-CM

## 2022-01-27 DIAGNOSIS — R609 Edema, unspecified: Secondary | ICD-10-CM | POA: Diagnosis not present

## 2022-01-27 DIAGNOSIS — E1149 Type 2 diabetes mellitus with other diabetic neurological complication: Secondary | ICD-10-CM

## 2022-01-27 DIAGNOSIS — R5381 Other malaise: Secondary | ICD-10-CM | POA: Diagnosis not present

## 2022-01-27 DIAGNOSIS — I10 Essential (primary) hypertension: Secondary | ICD-10-CM

## 2022-01-27 HISTORY — DX: Encounter for follow-up examination after completed treatment for conditions other than malignant neoplasm: Z09

## 2022-01-27 MED ORDER — WHEELCHAIR MISC: 0 refills | Status: DC

## 2022-01-27 NOTE — Assessment & Plan Note (Signed)
Hospital chart reviewed, including discharge summary ?Medications reconciled and reviewed with the patient ?

## 2022-01-27 NOTE — Assessment & Plan Note (Addendum)
BP Readings from Last 1 Encounters:  01/27/22 138/86   Was well-controlled with Losartan-HCTZ and Metoprolol Recently held Losartan-HCTZ due to AKI during recent hospitalization Also takes Lasix for LE swelling, advised to take Lasix 40 mg daily only for now Check BMP and will decide about losartan HCTZ Counseled for compliance with the medications Advised DASH diet and moderate exercise/walking, at least 150 mins/week

## 2022-01-27 NOTE — Progress Notes (Addendum)
Established Patient Office Visit  Subjective:  Patient ID: [name], female    DOB: 18-Nov-1965  Age: 56 y.o. MRN: [id number]  CC:  Chief Complaint  Patient presents with   Transitions Of Care    [facility] pt was discharged 01-18-22 was in there for leg swelling and AKI    HPI [name] is a 56 y.o. female with past medical history of HTN, uncontrolled type 2 DM with neuropathy, sigmoid colon ca. S/p partial colectomy, schizoaffective disorder and obesity who presents for f/u after recent hospitalization from 01/15/22-01/18/22.  She went to ER for left > right leg swelling for the last few days.  [place] of LE was negative for DVT. She was found to have AKI and was admitted for it.  She was given IV fluid for AKI.  US renal was unremarkable for any acute etiology. Her kidney function improved later with holding losartan HCTZ. She was given Lasix upon discharge for leg swelling.  She states that her leg swelling has been improving now.  Of note, she gets her medications through bubble wrap and has been taking her old medication regimen, which includes losartan/HCTZ and old dose of Lasix 40 mg daily in addition to recently given Lasix 20 mg daily.  She denies any dysuria, hematuria or urinary hesitance or resistance.  She has a wheelchair at home for ambulation, but its wheels are impaired and needs a new wheelchair.  She has diabetic neuropathy and history of Charcot's foot, due to which her capacity to ambulate is very limited.    Past Medical History:  Diagnosis Date   Charcot's joint of foot, left    Depression    Diabetes mellitus without complication ([place])    Hyperlipidemia    Hypertension    Neuromuscular disorder ([place])    Schizo affective schizophrenia ([place])    abilify and pazil    Past Surgical History:  Procedure Laterality Date   CHOLECYSTECTOMY     COLONOSCOPY WITH PROPOFOL N/A 05/26/2019   Procedure: COLONOSCOPY WITH PROPOFOL;  Surgeon: Toledo, Benay Pike,  MD;  Location: [facility];  Service: Gastroenterology;  Laterality: N/A;   ESOPHAGOGASTRODUODENOSCOPY (EGD) WITH PROPOFOL N/A 05/26/2019   Procedure: ESOPHAGOGASTRODUODENOSCOPY (EGD) WITH PROPOFOL;  Surgeon: Toledo, Benay Pike, MD;  Location: [facility];  Service: Gastroenterology;  Laterality: N/A;   IRRIGATION AND DEBRIDEMENT FOOT Right 02/26/2016   Procedure: RIGHT 2ND TOE DEBRIDEMENT;  Surgeon: Samara Deist, DPM;  Location: [facility];  Service: Podiatry;  Laterality: Right;   IRRIGATION AND DEBRIDEMENT FOOT Left 12/13/2018   Procedure: IRRIGATION AND DEBRIDEMENT FOOT;  Surgeon: Caroline More, DPM;  Location: [facility];  Service: Podiatry;  Laterality: Left;    Family History  Problem Relation Age of Onset   Breast cancer Cousin        maternal cousin   Hypertension Mother    Hyperthyroidism Mother    Dementia Mother    Prostate cancer Father    Diabetes Maternal Grandmother    Hypertension Maternal Grandmother    Cancer Maternal Grandmother        unknown type   Diabetes Maternal Grandfather    Hypertension Maternal Grandfather    Diabetes Paternal Grandmother    Hypertension Paternal Grandmother    Diabetes Paternal Grandfather    Hypertension Paternal Grandfather     Social History   Socioeconomic History   Marital status: Divorced    Spouse name: Not on file   Number of children: 2   Years of education: Not  on file   Highest education level: Not on file  Occupational History   Not on file  Tobacco Use   Smoking status: Every Day    Packs/day: 1.00    Years: 30.00    Total pack years: 30.00    Types: Cigarettes    Last attempt to quit: 05/02/2009    Years since quitting: 12.8   Smokeless tobacco: Never  Vaping Use   Vaping Use: Never used  Substance and Sexual Activity   Alcohol use: No   Drug use: No   Sexual activity: Not Currently  Other Topics Concern   Not on file  Social History Narrative   At home with mom, 29. Lives in brothers house who moved  out. Sister comes by every other day to check on patient and mom.   Social Determinants of Health   Financial Resource Strain: Low Risk  (11/04/2020)   Overall Financial Resource Strain (CARDIA)    Difficulty of Paying Living Expenses: Not hard at all  Food Insecurity: No Food Insecurity (01/16/2022)   Hunger Vital Sign    Worried About Running Out of Food in the Last Year: Never true    Ran Out of Food in the Last Year: Never true  Transportation Needs: No Transportation Needs (01/16/2022)   PRAPARE - Hydrologist (Medical): No    Lack of Transportation (Non-Medical): No  Physical Activity: Inactive (11/04/2020)   Exercise Vital Sign    Days of Exercise per Week: 0 days    Minutes of Exercise per Session: 0 min  Stress: No Stress Concern Present (11/04/2020)   [place]    Feeling of Stress : Only a little  Social Connections: Socially Isolated (11/04/2020)   Social Connection and Isolation Panel [NHANES]    Frequency of Communication with Friends and Family: More than three times a week    Frequency of Social Gatherings with Friends and Family: More than three times a week    Attends Religious Services: Never    Marine scientist or Organizations: No    Attends Archivist Meetings: Never    Marital Status: Divorced  Human resources officer Violence: Not At Risk (01/16/2022)   Humiliation, Afraid, Rape, and Kick questionnaire    Fear of Current or Ex-Partner: No    Emotionally Abused: No    Physically Abused: No    Sexually Abused: No    Outpatient Medications Prior to Visit  Medication Sig Dispense Refill   acetaminophen (TYLENOL) 325 MG tablet Take 2 tablets (650 mg total) by mouth every 6 (six) hours as needed for mild pain.     atorvastatin (LIPITOR) 40 MG tablet Take 1 tablet (40 mg total) by mouth daily. 90 tablet 1   colestipol (COLESTID) 1 g tablet TAKE TWO (2) TABLETS BY  MOUTH TWICE DAILY (Patient taking differently: Take 2 g by mouth 2 (two) times daily.) 120 tablet 10   Continuous Blood Gluc Sensor (DEXCOM G7 SENSOR) MISC Please use it to check blood glucose 3 times before meals, at bedtime and as needed. 3 each 11   gabapentin (NEURONTIN) 300 MG capsule Take 1 capsule (300 mg total) by mouth at bedtime. (Patient taking differently: Take 300 mg by mouth daily.) 90 capsule 1   ibuprofen (ADVIL) 200 MG tablet Take 400 mg by mouth every 6 (six) hours as needed for mild pain, headache or fever.     LANTUS SOLOSTAR 100 UNIT/ML  Solostar Pen INJECT 30 UNITS INTO THE SKIN ONCE DAILY AT BEDTIME 15 mL 0   metoprolol succinate (TOPROL-XL) 50 MG 24 hr tablet Take 1 tablet (50 mg total) by mouth daily. Take with or immediately following a meal. 90 tablet 1   nicotine (NICODERM CQ - DOSED IN MG/24 HOURS) 21 mg/24hr patch Place 1 patch (21 mg total) onto the skin daily. 28 patch 0   pantoprazole (PROTONIX) 40 MG tablet TAKE (1) TABLET BY MOUTH ONCE DAILY. (Patient taking differently: Take 40 mg by mouth daily.) 30 tablet 0   traZODone (DESYREL) 50 MG tablet Take 1 tablet (50 mg total) by mouth at bedtime as needed. for sleep (Patient taking differently: Take 50 mg by mouth at bedtime as needed for sleep.) 30 tablet 3   ARIPiprazole (ABILIFY) 15 MG tablet Take 1 tablet (15 mg total) by mouth daily. 30 tablet 10   buPROPion (WELLBUTRIN XL) 300 MG 24 hr tablet TAKE 1 TABLET BY MOUTH EVERY MORNING (Patient taking differently: Take 300 mg by mouth daily.) 30 tablet 10   furosemide (LASIX) 20 MG tablet Take 1 tablet (20 mg total) by mouth daily. 30 tablet 11   furosemide (LASIX) 40 MG tablet Take 40 mg by mouth.     losartan-hydrochlorothiazide (HYZAAR) 50-12.5 MG tablet Take 1 tablet by mouth daily.     metFORMIN (GLUCOPHAGE) 1000 MG tablet Take 1 tablet (1,000 mg total) by mouth 2 (two) times daily with a meal. 180 tablet 1   No facility-administered medications prior to visit.     No Known Allergies  ROS Review of Systems  Constitutional:  Negative for chills and fever.  HENT:  Negative for congestion, sinus pressure, sinus pain and sore throat.   Eyes:  Negative for pain and discharge.  Respiratory:  Negative for cough and shortness of breath.   Cardiovascular:  Positive for leg swelling. Negative for chest pain and palpitations.  Gastrointestinal:  Negative for abdominal pain, constipation, nausea and vomiting.  Endocrine: Negative for polydipsia and polyuria.  Genitourinary:  Negative for dysuria and hematuria.  Musculoskeletal:  Positive for arthralgias and gait problem. Negative for neck pain and neck stiffness.  Skin:  Negative for rash.  Neurological:  Negative for dizziness and weakness.  Psychiatric/Behavioral:  Negative for agitation and behavioral problems. The patient is nervous/anxious.       Objective:    Physical Exam Vitals reviewed.  Constitutional:      General: She is not in acute distress.    Appearance: She is obese. She is not diaphoretic.     Comments: Uses walker  HENT:     Head: Normocephalic and atraumatic.     Nose: Nose normal.     Mouth/Throat:     Mouth: Mucous membranes are moist.  Eyes:     General: No scleral icterus.    Extraocular Movements: Extraocular movements intact.  Cardiovascular:     Rate and Rhythm: Normal rate and regular rhythm.     Pulses: Normal pulses.     Heart sounds: Normal heart sounds. No murmur heard. Pulmonary:     Breath sounds: Normal breath sounds. No wheezing or rales.  Abdominal:     Palpations: Abdomen is soft.     Tenderness: There is no abdominal tenderness.  Musculoskeletal:     Cervical back: Neck supple. No tenderness.     Right lower leg: Edema (1+) present.     Left lower leg: Edema (2+) present.  Skin:    General: Skin is  warm.     Findings: No rash.  Neurological:     General: No focal deficit present.     Mental Status: She is alert and oriented to person, place,  and time.     Sensory: Sensory deficit (B/l LE) present.     Motor: Weakness (3/5 - b/l LE) present.  Psychiatric:        Mood and Affect: Mood normal.        Behavior: Behavior normal.     BP 138/86 (BP Location: Right Arm, Patient Position: Sitting, Cuff Size: Normal)   Pulse 85   Resp 16   Ht _0  (1.778 m)   Wt 256 lb 6.4 oz (116.3 kg)   SpO2 100%   BMI 36.79 kg/m  Wt Readings from Last 3 Encounters:  02/18/22 259 lb 3.2 oz (117.6 kg)  01/27/22 256 lb 6.4 oz (116.3 kg)  01/15/22 261 lb 0.4 oz (118.4 kg)    Lab Results  Component Value Date   TSH 2.140 11/23/2021   Lab Results  Component Value Date   WBC 11.2 (H) 01/27/2022   HGB 10.9 (L) 01/27/2022   HCT 31.9 (L) 01/27/2022   MCV 88 01/27/2022   PLT 319 01/27/2022   Lab Results  Component Value Date   NA 139 01/27/2022   K 5.2 01/27/2022   CO2 17 (L) 01/27/2022   GLUCOSE 136 (H) 01/27/2022   BUN 37 (H) 01/27/2022   CREATININE 2.50 (H) 01/27/2022   BILITOT 0.6 01/18/2022   ALKPHOS 127 (H) 01/18/2022   AST 14 (L) 01/18/2022   ALT 17 01/18/2022   PROT 7.4 01/18/2022   ALBUMIN 3.6 01/18/2022   CALCIUM 10.0 01/27/2022   ANIONGAP 6 01/18/2022   EGFR 22 (L) 01/27/2022   Lab Results  Component Value Date   CHOL 168 11/23/2021   Lab Results  Component Value Date   HDL 40 11/23/2021   Lab Results  Component Value Date   LDLCALC 77 11/23/2021   Lab Results  Component Value Date   TRIG 313 (H) 11/23/2021   Lab Results  Component Value Date   CHOLHDL 4.2 11/23/2021   Lab Results  Component Value Date   HGBA1C 8.1 (H) 01/16/2022      Assessment & Plan:   Problem List Items Addressed This Visit       Cardiovascular and Mediastinum   Essential hypertension (Chronic)    BP Readings from Last 1 Encounters:  01/27/22 138/86  Was well-controlled with Losartan-HCTZ and Metoprolol Recently held Losartan-HCTZ due to AKI during recent hospitalization Also takes Lasix for LE swelling, advised to  take Lasix 40 mg daily only for now Check BMP and will decide about losartan HCTZ Counseled for compliance with the medications Advised DASH diet and moderate exercise/walking, at least 150 mins/week      Relevant Orders   CBC with Differential/Platelet (Completed)   Basic Metabolic Panel (BMET) (Completed)     Endocrine   Type 2 diabetes mellitus with neurological complications (HCC)    CNO7S: 8.1, better than prior On Lantus 30 U QD and Metformin 1000 mg BID Recheck BMP, may need to stop Metformin if GFR < 30 Uncontrolled currently Advised to follow diabetic diet On statin F/u CMP and lipid panel Diabetic eye exam: Advised to follow up with Ophthalmology for diabetic eye exam        Musculoskeletal and Integument   Charcot's joint of foot, left    Leg swelling could be also related to Charcot's joint  Needs to follow up with podiatry      Relevant Medications   Misc. Devices [facility]) MISC     Genitourinary   AKI (acute kidney injury) ([place])    AKI on CKD Likely due to dehydration Improved with gentle hydration Held losartan HCTZ, but patient still takes it Check BMP and will decide about losartan HCTZ      Relevant Orders   CBC with Differential/Platelet (Completed)   Basic Metabolic Panel (BMET) (Completed)     Other   Hospital discharge follow-up    Hospital chart reviewed, including discharge summary Medications reconciled and reviewed with the patient      Peripheral edema - Primary    Chronic leg swelling, recently worse Now improved with resuming Lasix Has history of Charcot's joint as well, which could also contribute to ankle swelling Leg elevation and compression stockings      Relevant Orders   CBC with Differential/Platelet (Completed)   Basic Metabolic Panel (BMET) (Completed)   Physical deconditioning    Has limited mobility due to diabetic neuropathy and Charcot's joint Has wheelchair, but is impaired - new wheelchair prescription  provided      Relevant Medications   Misc. Devices [facility]) MISC    Meds ordered this encounter  Medications   Misc. Devices [facility]) MISC    Sig: Use it to ambulate short distance.    Dispense:  1 each    Refill:  0    Follow-up: Return in about 2 weeks (around 02/10/2022) for HTN and AKI.    Lindell Spar, MD

## 2022-01-27 NOTE — Assessment & Plan Note (Signed)
Leg swelling could be also related to Charcot's joint Needs to follow up with podiatry

## 2022-01-27 NOTE — Assessment & Plan Note (Signed)
Chronic leg swelling, recently worse Now improved with resuming Lasix Has history of Charcot's joint as well, which could also contribute to ankle swelling Leg elevation and compression stockings 

## 2022-01-27 NOTE — Assessment & Plan Note (Addendum)
Has limited mobility due to diabetic neuropathy and Charcot's joint Has wheelchair, but is impaired - new wheelchair prescription provided

## 2022-01-27 NOTE — Assessment & Plan Note (Addendum)
AKI on CKD Likely due to dehydration Improved with gentle hydration Held losartan HCTZ, but patient still takes it Check BMP and will decide about losartan HCTZ

## 2022-01-27 NOTE — Assessment & Plan Note (Addendum)
HbA1C: 8.1, better than prior On Lantus 30 U QD and Metformin 1000 mg BID Recheck BMP, may need to stop Metformin if GFR < 30 Uncontrolled currently Advised to follow diabetic diet On statin F/u CMP and lipid panel Diabetic eye exam: Advised to follow up with Ophthalmology for diabetic eye exam

## 2022-01-27 NOTE — Patient Instructions (Signed)
Please continue taking medications as prescribed.  Please continue to follow low salt diet and ambulate as tolerated.  Please check with pharmacy to identify Losartan-HCTZ. We will let you know tomorrow if you need to stop taking it.

## 2022-01-28 ENCOUNTER — Other Ambulatory Visit: Payer: Self-pay | Admitting: Internal Medicine

## 2022-01-28 LAB — CBC WITH DIFFERENTIAL/PLATELET
Basophils Absolute: 0.1 10*3/uL (ref 0.0–0.2)
Basos: 1 %
EOS (ABSOLUTE): 0.3 10*3/uL (ref 0.0–0.4)
Eos: 3 %
Hematocrit: 31.9 % — ABNORMAL LOW (ref 34.0–46.6)
Hemoglobin: 10.9 g/dL — ABNORMAL LOW (ref 11.1–15.9)
Immature Grans (Abs): 0.1 10*3/uL (ref 0.0–0.1)
Immature Granulocytes: 1 %
Lymphocytes Absolute: 3.6 10*3/uL — ABNORMAL HIGH (ref 0.7–3.1)
Lymphs: 33 %
MCH: 30.2 pg (ref 26.6–33.0)
MCHC: 34.2 g/dL (ref 31.5–35.7)
MCV: 88 fL (ref 79–97)
Monocytes Absolute: 0.7 10*3/uL (ref 0.1–0.9)
Monocytes: 6 %
Neutrophils Absolute: 6.4 10*3/uL (ref 1.4–7.0)
Neutrophils: 56 %
Platelets: 319 10*3/uL (ref 150–450)
RBC: 3.61 x10E6/uL — ABNORMAL LOW (ref 3.77–5.28)
RDW: 13.8 % (ref 11.7–15.4)
WBC: 11.2 10*3/uL — ABNORMAL HIGH (ref 3.4–10.8)

## 2022-01-28 LAB — BASIC METABOLIC PANEL
BUN/Creatinine Ratio: 15 (ref 9–23)
BUN: 37 mg/dL — ABNORMAL HIGH (ref 6–24)
CO2: 17 mmol/L — ABNORMAL LOW (ref 20–29)
Calcium: 10 mg/dL (ref 8.7–10.2)
Chloride: 104 mmol/L (ref 96–106)
Creatinine, Ser: 2.5 mg/dL — ABNORMAL HIGH (ref 0.57–1.00)
Glucose: 136 mg/dL — ABNORMAL HIGH (ref 70–99)
Potassium: 5.2 mmol/L (ref 3.5–5.2)
Sodium: 139 mmol/L (ref 134–144)
eGFR: 22 mL/min/{1.73_m2} — ABNORMAL LOW (ref >59)

## 2022-02-01 ENCOUNTER — Encounter: Payer: Self-pay | Admitting: Podiatry

## 2022-02-02 ENCOUNTER — Encounter: Payer: Self-pay | Admitting: Podiatry

## 2022-02-04 ENCOUNTER — Other Ambulatory Visit: Payer: Self-pay | Admitting: Internal Medicine

## 2022-02-04 DIAGNOSIS — F251 Schizoaffective disorder, depressive type: Secondary | ICD-10-CM

## 2022-02-09 ENCOUNTER — Ambulatory Visit: Payer: Medicare Other | Admitting: Podiatry

## 2022-02-09 ENCOUNTER — Telehealth: Payer: Self-pay | Admitting: *Deleted

## 2022-02-09 NOTE — Telephone Encounter (Signed)
Patient is calling because she is at the [facility] location and was unaware that her appointment had been cancelled. She is wanting her toenails clipped. She is waiting on her ride outside but thinks this is very unfair. Can a doctor cut her nails today?

## 2022-02-10 ENCOUNTER — Ambulatory Visit: Payer: Medicare Other | Admitting: Internal Medicine

## 2022-02-10 ENCOUNTER — Encounter: Payer: Self-pay | Admitting: Internal Medicine

## 2022-02-14 ENCOUNTER — Ambulatory Visit (INDEPENDENT_AMBULATORY_CARE_PROVIDER_SITE_OTHER): Payer: Medicare HMO | Admitting: Gastroenterology

## 2022-02-14 ENCOUNTER — Encounter (INDEPENDENT_AMBULATORY_CARE_PROVIDER_SITE_OTHER): Payer: Self-pay | Admitting: Gastroenterology

## 2022-02-16 ENCOUNTER — Telehealth: Payer: Self-pay | Admitting: Internal Medicine

## 2022-02-16 NOTE — Telephone Encounter (Signed)
I talked with patient on the phone today regarding the letter for no show appointments.  I told her we understand she has a ride issue.  I asked her to check 24 + hours ahead of the appointment and if she can not confirm her ride call us to reschedule the appointment.  It would be ok if she needs to reschedule as long as she is doing it ahead of time.  I do not think she understood the process but now she understands to call us 1-2 days ahead if she has not confirmed a ride.

## 2022-02-16 NOTE — Telephone Encounter (Signed)
Talked with patient about her no show rate - she is at a 25% rate.  I e

## 2022-02-18 ENCOUNTER — Encounter: Payer: Self-pay | Admitting: Internal Medicine

## 2022-02-18 ENCOUNTER — Ambulatory Visit (INDEPENDENT_AMBULATORY_CARE_PROVIDER_SITE_OTHER): Payer: Medicare Other | Admitting: Internal Medicine

## 2022-02-18 VITALS — BP 132/80 | HR 84 | Resp 18 | Ht 70.5 in | Wt 259.2 lb

## 2022-02-18 DIAGNOSIS — F251 Schizoaffective disorder, depressive type: Secondary | ICD-10-CM

## 2022-02-18 DIAGNOSIS — Z23 Encounter for immunization: Secondary | ICD-10-CM | POA: Diagnosis not present

## 2022-02-18 DIAGNOSIS — L97929 Non-pressure chronic ulcer of unspecified part of left lower leg with unspecified severity: Secondary | ICD-10-CM | POA: Diagnosis not present

## 2022-02-18 DIAGNOSIS — N179 Acute kidney failure, unspecified: Secondary | ICD-10-CM

## 2022-02-18 DIAGNOSIS — I83029 Varicose veins of left lower extremity with ulcer of unspecified site: Secondary | ICD-10-CM

## 2022-02-18 DIAGNOSIS — I10 Essential (primary) hypertension: Secondary | ICD-10-CM

## 2022-02-18 DIAGNOSIS — E1149 Type 2 diabetes mellitus with other diabetic neurological complication: Secondary | ICD-10-CM

## 2022-02-18 MED ORDER — MUPIROCIN 2 % EX OINT: 1.0000 | TOPICAL_OINTMENT | Freq: Two times a day (BID) | CUTANEOUS | 0 refills | Status: AC

## 2022-02-18 MED ORDER — GLIPIZIDE ER 5 MG PO TB24: 5.0000 mg | ORAL_TABLET | Freq: Every day | ORAL | 2 refills | Status: DC

## 2022-02-18 NOTE — Assessment & Plan Note (Signed)
BP Readings from Last 1 Encounters:  02/18/22 132/80   Was well-controlled with Losartan-HCTZ and Metoprolol Recently held Losartan-HCTZ due to AKI during recent hospitalization Also takes Lasix for LE swelling, advised to take Lasix 40 mg daily only for now Check BMP and will decide about losartan HCTZ Counseled for compliance with the medications Advised DASH diet and moderate exercise/walking, at least 150 mins/week

## 2022-02-18 NOTE — Progress Notes (Signed)
Established Patient Office Visit  Subjective:  Patient ID: [name], female    DOB: 1966-03-13  Age: 56 y.o. MRN: [id number]  CC:  Chief Complaint  Patient presents with   Follow-up    2 week follow up pt having left leg swelling stopped taking extra fluid pill feels like she needs this back also would like endocrinology referral for diabetes management also she has not seen psych for that medication wants to know if you can refill    HPI [name] is a 56 y.o. female with past medical history of HTN, uncontrolled type 2 DM with neuropathy, sigmoid colon ca. S/p partial colectomy, schizoaffective disorder and obesity who presents for f/u of her chronic medical conditions.  AKI: She was found to have AKI on CKD in the last visit.  She was advised to hold metformin and Lasix additional dose.  She has stopped taking metformin and additional dose of Lasix, but has also stopped taking Lantus as she misread her message - read Lantus instead of Lasix.  Left leg ulcer: She has recent worsening of left LE swelling since stopping her additional dose of Lasix.  She still takes Lasix 40 mg, although compliance is questionable.  She has open skin lesion now over left leg, and has serous discharge from it.  It has yellowish base with granulation tissue surrounding it.  She denies any recent worsening of numbness or tingling of the LE.  She has history of diabetic neuropathy.  Type II DM: She has stopped taking metformin and Lantus.  She agrees to start taking Lantus again.  Her Dexcom was attached in the office in the last visit, but she states that it fell off after 2 days.  She has not brought her Dexcom meter today.  She has not been checking her blood glucose recently, but she agrees to check it regularly.  She requests referral to endocrinology.   Past Medical History:  Diagnosis Date   Charcot's joint of foot, left    Depression    Diabetes mellitus without complication  ([place])    Hyperlipidemia    Hypertension    Neuromuscular disorder ([place])    Schizo affective schizophrenia ([place])    abilify and pazil    Past Surgical History:  Procedure Laterality Date   CHOLECYSTECTOMY     COLONOSCOPY WITH PROPOFOL N/A 05/26/2019   Procedure: COLONOSCOPY WITH PROPOFOL;  Surgeon: Toledo, Benay Pike, MD;  Location: [facility];  Service: Gastroenterology;  Laterality: N/A;   ESOPHAGOGASTRODUODENOSCOPY (EGD) WITH PROPOFOL N/A 05/26/2019   Procedure: ESOPHAGOGASTRODUODENOSCOPY (EGD) WITH PROPOFOL;  Surgeon: Toledo, Benay Pike, MD;  Location: [facility];  Service: Gastroenterology;  Laterality: N/A;   IRRIGATION AND DEBRIDEMENT FOOT Right 02/26/2016   Procedure: RIGHT 2ND TOE DEBRIDEMENT;  Surgeon: Samara Deist, DPM;  Location: [facility] ORS;  Service: Podiatry;  Laterality: Right;   IRRIGATION AND DEBRIDEMENT FOOT Left 12/13/2018   Procedure: IRRIGATION AND DEBRIDEMENT FOOT;  Surgeon: Caroline More, DPM;  Location: [facility];  Service: Podiatry;  Laterality: Left;    Family History  Problem Relation Age of Onset   Breast cancer Cousin        maternal cousin   Hypertension Mother    Hyperthyroidism Mother    Dementia Mother    Prostate cancer Father    Diabetes Maternal Grandmother    Hypertension Maternal Grandmother    Cancer Maternal Grandmother        unknown type   Diabetes Maternal Grandfather    Hypertension  Maternal Grandfather    Diabetes Paternal Grandmother    Hypertension Paternal Grandmother    Diabetes Paternal Grandfather    Hypertension Paternal Grandfather     Social History   Socioeconomic History   Marital status: Divorced    Spouse name: Not on file   Number of children: 2   Years of education: Not on file   Highest education level: Not on file  Occupational History   Not on file  Tobacco Use   Smoking status: Every Day    Packs/day: 1.00    Years: 30.00    Total pack years: 30.00    Types: Cigarettes    Last attempt to quit:  05/02/2009    Years since quitting: 12.8   Smokeless tobacco: Never  Vaping Use   Vaping Use: Never used  Substance and Sexual Activity   Alcohol use: No   Drug use: No   Sexual activity: Not Currently  Other Topics Concern   Not on file  Social History Narrative   At home with mom, 73. Lives in brothers house who moved out. Sister comes by every other day to check on patient and mom.   Social Determinants of Health   Financial Resource Strain: Low Risk  (11/04/2020)   Overall Financial Resource Strain (CARDIA)    Difficulty of Paying Living Expenses: Not hard at all  Food Insecurity: No Food Insecurity (01/16/2022)   Hunger Vital Sign    Worried About Running Out of Food in the Last Year: Never true    Ran Out of Food in the Last Year: Never true  Transportation Needs: No Transportation Needs (01/16/2022)   PRAPARE - Hydrologist (Medical): No    Lack of Transportation (Non-Medical): No  Physical Activity: Inactive (11/04/2020)   Exercise Vital Sign    Days of Exercise per Week: 0 days    Minutes of Exercise per Session: 0 min  Stress: No Stress Concern Present (11/04/2020)   [place]    Feeling of Stress : Only a little  Social Connections: Socially Isolated (11/04/2020)   Social Connection and Isolation Panel [NHANES]    Frequency of Communication with Friends and Family: More than three times a week    Frequency of Social Gatherings with Friends and Family: More than three times a week    Attends Religious Services: Never    Marine scientist or Organizations: No    Attends Archivist Meetings: Never    Marital Status: Divorced  Human resources officer Violence: Not At Risk (01/16/2022)   Humiliation, Afraid, Rape, and Kick questionnaire    Fear of Current or Ex-Partner: No    Emotionally Abused: No    Physically Abused: No    Sexually Abused: No    Outpatient Medications  Prior to Visit  Medication Sig Dispense Refill   acetaminophen (TYLENOL) 325 MG tablet Take 2 tablets (650 mg total) by mouth every 6 (six) hours as needed for mild pain.     ARIPiprazole (ABILIFY) 15 MG tablet TAKE (1) TABLET BY MOUTH ONCE DAILY. 30 tablet 0   atorvastatin (LIPITOR) 40 MG tablet Take 1 tablet (40 mg total) by mouth daily. 90 tablet 1   buPROPion (WELLBUTRIN XL) 300 MG 24 hr tablet TAKE ONE TABLET BY MOUTH EVERY MORNING. 30 tablet 0   colestipol (COLESTID) 1 g tablet TAKE TWO (2) TABLETS BY MOUTH TWICE DAILY (Patient taking differently: Take 2 g by  mouth 2 (two) times daily.) 120 tablet 10   Continuous Blood Gluc Sensor (DEXCOM G7 SENSOR) MISC Please use it to check blood glucose 3 times before meals, at bedtime and as needed. 3 each 11   furosemide (LASIX) 40 MG tablet TAKE (1) TABLET BY MOUTH ONCE DAILY. 30 tablet 0   gabapentin (NEURONTIN) 300 MG capsule Take 1 capsule (300 mg total) by mouth at bedtime. (Patient taking differently: Take 300 mg by mouth daily.) 90 capsule 1   ibuprofen (ADVIL) 200 MG tablet Take 400 mg by mouth every 6 (six) hours as needed for mild pain, headache or fever.     LANTUS SOLOSTAR 100 UNIT/ML Solostar Pen INJECT 30 UNITS INTO THE SKIN ONCE DAILY AT BEDTIME 15 mL 0   losartan-hydrochlorothiazide (HYZAAR) 50-12.5 MG tablet TAKE (1) TABLET BY MOUTH ONCE DAILY. 30 tablet 0   metoprolol succinate (TOPROL-XL) 50 MG 24 hr tablet Take 1 tablet (50 mg total) by mouth daily. Take with or immediately following a meal. 90 tablet 1   Misc. Devices [facility]) MISC Use it to ambulate short distance. 1 each 0   nicotine (NICODERM CQ - DOSED IN MG/24 HOURS) 21 mg/24hr patch Place 1 patch (21 mg total) onto the skin daily. 28 patch 0   pantoprazole (PROTONIX) 40 MG tablet TAKE (1) TABLET BY MOUTH ONCE DAILY. (Patient taking differently: Take 40 mg by mouth daily.) 30 tablet 0   traZODone (DESYREL) 50 MG tablet Take 1 tablet (50 mg total) by mouth at bedtime as  needed. for sleep (Patient taking differently: Take 50 mg by mouth at bedtime as needed for sleep.) 30 tablet 3   No facility-administered medications prior to visit.    No Known Allergies  ROS Review of Systems  Constitutional:  Negative for chills and fever.  HENT:  Negative for congestion, sinus pressure, sinus pain and sore throat.   Eyes:  Negative for pain and discharge.  Respiratory:  Negative for cough and shortness of breath.   Cardiovascular:  Positive for leg swelling. Negative for chest pain and palpitations.  Gastrointestinal:  Negative for abdominal pain, constipation, nausea and vomiting.  Endocrine: Negative for polydipsia and polyuria.  Genitourinary:  Negative for dysuria and hematuria.  Musculoskeletal:  Positive for arthralgias and gait problem. Negative for neck pain and neck stiffness.  Skin:  Positive for wound. Negative for rash.  Neurological:  Negative for dizziness and weakness.  Psychiatric/Behavioral:  Negative for agitation and behavioral problems. The patient is nervous/anxious.       Objective:    Physical Exam Vitals reviewed.  Constitutional:      General: She is not in acute distress.    Appearance: She is obese. She is not diaphoretic.     Comments: Uses walker  HENT:     Head: Normocephalic and atraumatic.     Nose: Nose normal.     Mouth/Throat:     Mouth: Mucous membranes are moist.  Eyes:     General: No scleral icterus.    Extraocular Movements: Extraocular movements intact.  Cardiovascular:     Rate and Rhythm: Normal rate and regular rhythm.     Heart sounds: Normal heart sounds. No murmur heard. Pulmonary:     Breath sounds: Normal breath sounds. No wheezing or rales.  Musculoskeletal:     Cervical back: Neck supple. No tenderness.     Right lower leg: Edema (1+) present.     Left lower leg: Edema (2+) present.  Skin:    General:  Skin is warm.     Comments: Left leg ulceration, about 2 cm in diameter, has yellowish base  with surrounding granulation tissue, serosanguineous discharge noted  Neurological:     General: No focal deficit present.     Mental Status: She is alert and oriented to person, place, and time.     Sensory: Sensory deficit (B/l LE) present.     Motor: Weakness (3/5 - b/l LE) present.  Psychiatric:        Mood and Affect: Mood normal.        Behavior: Behavior normal.     BP 132/80 (BP Location: Right Arm, Patient Position: Sitting, Cuff Size: Normal)   Pulse 84   Resp 18   Ht 5' 10.5" (1.791 m)   Wt 259 lb 3.2 oz (117.6 kg)   SpO2 100%   BMI 36.67 kg/m  Wt Readings from Last 3 Encounters:  02/18/22 259 lb 3.2 oz (117.6 kg)  01/27/22 256 lb 6.4 oz (116.3 kg)  01/15/22 261 lb 0.4 oz (118.4 kg)    Lab Results  Component Value Date   TSH 2.140 11/23/2021   Lab Results  Component Value Date   WBC 11.2 (H) 01/27/2022   HGB 10.9 (L) 01/27/2022   HCT 31.9 (L) 01/27/2022   MCV 88 01/27/2022   PLT 319 01/27/2022   Lab Results  Component Value Date   NA 139 01/27/2022   K 5.2 01/27/2022   CO2 17 (L) 01/27/2022   GLUCOSE 136 (H) 01/27/2022   BUN 37 (H) 01/27/2022   CREATININE 2.50 (H) 01/27/2022   BILITOT 0.6 01/18/2022   ALKPHOS 127 (H) 01/18/2022   AST 14 (L) 01/18/2022   ALT 17 01/18/2022   PROT 7.4 01/18/2022   ALBUMIN 3.6 01/18/2022   CALCIUM 10.0 01/27/2022   ANIONGAP 6 01/18/2022   EGFR 22 (L) 01/27/2022   Lab Results  Component Value Date   CHOL 168 11/23/2021   Lab Results  Component Value Date   HDL 40 11/23/2021   Lab Results  Component Value Date   LDLCALC 77 11/23/2021   Lab Results  Component Value Date   TRIG 313 (H) 11/23/2021   Lab Results  Component Value Date   CHOLHDL 4.2 11/23/2021   Lab Results  Component Value Date   HGBA1C 8.1 (H) 01/16/2022      Assessment & Plan:   Problem List Items Addressed This Visit       Cardiovascular and Mediastinum   Essential hypertension (Chronic)    BP Readings from Last 1 Encounters:   02/18/22 132/80  Was well-controlled with Losartan-HCTZ and Metoprolol Recently held Losartan-HCTZ due to AKI during recent hospitalization Also takes Lasix for LE swelling, advised to take Lasix 40 mg daily only for now Check BMP and will decide about losartan HCTZ Counseled for compliance with the medications Advised DASH diet and moderate exercise/walking, at least 150 mins/week        Endocrine   Type 2 diabetes mellitus with neurological complications ([place]) - Primary    HbA1C: 8.1 Uncontrolled currently, better than prior On Lantus 30 U QD, held Metformin 1000 mg BID due to GFR < 30 Recheck BMP Added glipizide 5 mg daily Referred to Endocrinology Advised to follow diabetic diet On statin and ARB F/u CMP and lipid panel Diabetic eye exam: Advised to follow up with Ophthalmology for diabetic eye exam      Relevant Medications   glipiZIDE (GLUCOTROL XL) 5 MG 24 hr tablet   Other  Relevant Orders   Ambulatory referral to Endocrinology   Basic Metabolic Panel (BMET)     Musculoskeletal and Integument   Venous ulcer of left leg (HCC)    Has left leg ulcer Has history of uncontrolled type II DM and chronic leg swelling likely due to chronic venous insufficiency Mupirocin ointment for secondary bacterial ppx She is at high risk of complications from venous ulcer Urgent referral to vascular surgery      Relevant Medications   mupirocin ointment (BACTROBAN) 2 %   Other Relevant Orders   Ambulatory referral to Vascular Surgery     Genitourinary   AKI (acute kidney injury) ([place])    AKI on CKD Likely due to dehydration Improved with gentle hydration Recently had metformin and additional 20 mg dose of Lasix Check BMP and will decide about losartan HCTZ      Relevant Orders   Basic Metabolic Panel (BMET)     Other   Schizoaffective disorder (HCC)    On Abilify and Wellbutrin, although unclear whether she is taking them On Trazodone Was followed by Psychiatry, lost  to follow up, referred to [facility] - needs to have follow up visit and clarification of medications      Relevant Orders   Ambulatory referral to Psychiatry   Other Visit Diagnoses     Need for immunization against influenza       Relevant Orders   Flu Vaccine QUAD 56moIM (Fluarix, Fluzone & Alfiuria Quad PF) (Completed)       Meds ordered this encounter  Medications   glipiZIDE (GLUCOTROL XL) 5 MG 24 hr tablet    Sig: Take 1 tablet (5 mg total) by mouth daily with breakfast.    Dispense:  30 tablet    Refill:  2    PLEASE REMOVE METFORMIN FROM PILL PACK.   mupirocin ointment (BACTROBAN) 2 %    Sig: Apply 1 Application topically 2 (two) times daily.    Dispense:  22 g    Refill:  0    Follow-up: Return in about 4 weeks (around 03/18/2022) for DM and AKI.    RLindell Spar MD

## 2022-02-18 NOTE — Assessment & Plan Note (Signed)
AKI on CKD Likely due to dehydration Improved with gentle hydration Recently had metformin and additional 20 mg dose of Lasix Check BMP and will decide about losartan HCTZ

## 2022-02-18 NOTE — Patient Instructions (Signed)
Please start taking Glipizide. Please do not take Metformin. Please continue to take Lantus 30 U at bedtime.  Please follow low carb diet and ambulate as tolerated.  Please bring your medications in the next visit.  Please continue to check blood glucose regularly and bring the log in the next visit.

## 2022-02-18 NOTE — Assessment & Plan Note (Signed)
HbA1C: 8.1 Uncontrolled currently, better than prior On Lantus 30 U QD, held Metformin 1000 mg BID due to GFR < 30 Recheck BMP Added glipizide 5 mg daily Referred to Endocrinology Advised to follow diabetic diet On statin and ARB F/u CMP and lipid panel Diabetic eye exam: Advised to follow up with Ophthalmology for diabetic eye exam

## 2022-02-18 NOTE — Assessment & Plan Note (Signed)
On Abilify and Wellbutrin, although unclear whether she is taking them On Trazodone Was followed by Psychiatry, lost to follow up, referred to [place] clinic - needs to have follow up visit and clarification of medications 

## 2022-02-18 NOTE — Assessment & Plan Note (Signed)
Has left leg ulcer Has history of uncontrolled type II DM and chronic leg swelling likely due to chronic venous insufficiency Mupirocin ointment for secondary bacterial ppx She is at high risk of complications from venous ulcer Urgent referral to vascular surgery

## 2022-02-19 LAB — BASIC METABOLIC PANEL
BUN/Creatinine Ratio: 17 (ref 9–23)
BUN: 34 mg/dL — ABNORMAL HIGH (ref 6–24)
CO2: 21 mmol/L (ref 20–29)
Calcium: 9.5 mg/dL (ref 8.7–10.2)
Chloride: 103 mmol/L (ref 96–106)
Creatinine, Ser: 2.06 mg/dL — ABNORMAL HIGH (ref 0.57–1.00)
Glucose: 282 mg/dL — ABNORMAL HIGH (ref 70–99)
Potassium: 5.2 mmol/L (ref 3.5–5.2)
Sodium: 138 mmol/L (ref 134–144)
eGFR: 27 mL/min/{1.73_m2} — ABNORMAL LOW (ref >59)

## 2022-02-21 ENCOUNTER — Other Ambulatory Visit: Payer: Self-pay | Admitting: *Deleted

## 2022-02-21 ENCOUNTER — Other Ambulatory Visit: Payer: Self-pay | Admitting: Internal Medicine

## 2022-02-21 DIAGNOSIS — I83029 Varicose veins of left lower extremity with ulcer of unspecified site: Secondary | ICD-10-CM

## 2022-02-22 ENCOUNTER — Other Ambulatory Visit: Payer: Self-pay | Admitting: *Deleted

## 2022-02-22 DIAGNOSIS — N184 Chronic kidney disease, stage 4 (severe): Secondary | ICD-10-CM

## 2022-02-24 DIAGNOSIS — H3582 Retinal ischemia: Secondary | ICD-10-CM | POA: Diagnosis not present

## 2022-02-24 DIAGNOSIS — E113412 Type 2 diabetes mellitus with severe nonproliferative diabetic retinopathy with macular edema, left eye: Secondary | ICD-10-CM | POA: Diagnosis not present

## 2022-02-24 DIAGNOSIS — H43823 Vitreomacular adhesion, bilateral: Secondary | ICD-10-CM | POA: Diagnosis not present

## 2022-02-24 DIAGNOSIS — E113511 Type 2 diabetes mellitus with proliferative diabetic retinopathy with macular edema, right eye: Secondary | ICD-10-CM | POA: Diagnosis not present

## 2022-02-24 DIAGNOSIS — H35033 Hypertensive retinopathy, bilateral: Secondary | ICD-10-CM | POA: Diagnosis not present

## 2022-02-25 ENCOUNTER — Telehealth: Payer: Self-pay | Admitting: Internal Medicine

## 2022-02-25 ENCOUNTER — Encounter: Payer: Self-pay | Admitting: Oncology

## 2022-02-25 NOTE — Telephone Encounter (Signed)
Patient called in regard to fluid.  Patient was takes off one of her fluid medications, but patient is experiencing more swelling .  Patient wants to know if she can take fluid medication (still has some) or if she should wait to have ultrasound done on Monday.  Patient wants a call back.

## 2022-02-25 NOTE — Telephone Encounter (Signed)
Patient advised with verbal understanding  

## 2022-02-28 ENCOUNTER — Ambulatory Visit: Payer: Medicare HMO | Admitting: Internal Medicine

## 2022-02-28 ENCOUNTER — Ambulatory Visit (HOSPITAL_COMMUNITY)
Admission: RE | Admit: 2022-02-28 | Discharge: 2022-02-28 | Disposition: A | Discharge status: Home / Self Care | Payer: Medicare Other | Source: Ambulatory Visit | Attending: Surgery | Admitting: Surgery

## 2022-02-28 DIAGNOSIS — I83029 Varicose veins of left lower extremity with ulcer of unspecified site: Secondary | ICD-10-CM | POA: Diagnosis not present

## 2022-02-28 DIAGNOSIS — L97929 Non-pressure chronic ulcer of unspecified part of left lower leg with unspecified severity: Secondary | ICD-10-CM | POA: Diagnosis not present

## 2022-03-01 ENCOUNTER — Telehealth: Payer: Self-pay | Admitting: Internal Medicine

## 2022-03-01 ENCOUNTER — Other Ambulatory Visit: Payer: Self-pay | Admitting: *Deleted

## 2022-03-01 MED ORDER — LANTUS SOLOSTAR 100 UNIT/ML ~~LOC~~ SOPN: PEN_INJECTOR | SUBCUTANEOUS | 0 refills | Status: DC

## 2022-03-01 NOTE — Telephone Encounter (Signed)
Pt called stating she has been out of insulin for 1 wk. States she has been trying to get it refilled and phar is telling her she has to wait another week for it to be filled so that insu will cover. Pt states she can't go another week without the insulin. Can you please see if you can get this refilled for her?   LANTUS SOLOSTAR 100 UNIT/ML Solostar Pen [[id number]]   [id number]

## 2022-03-01 NOTE — Telephone Encounter (Signed)
Refill sent to pharmacy please let the patient know if she has any trouble receiving this to let us know

## 2022-03-02 ENCOUNTER — Ambulatory Visit: Payer: Medicare Other | Admitting: Podiatry

## 2022-03-04 ENCOUNTER — Other Ambulatory Visit: Payer: Self-pay | Admitting: Internal Medicine

## 2022-03-04 DIAGNOSIS — E1142 Type 2 diabetes mellitus with diabetic polyneuropathy: Secondary | ICD-10-CM

## 2022-03-04 DIAGNOSIS — E1169 Type 2 diabetes mellitus with other specified complication: Secondary | ICD-10-CM

## 2022-03-04 DIAGNOSIS — F251 Schizoaffective disorder, depressive type: Secondary | ICD-10-CM

## 2022-03-04 DIAGNOSIS — I10 Essential (primary) hypertension: Secondary | ICD-10-CM

## 2022-03-10 DIAGNOSIS — E113511 Type 2 diabetes mellitus with proliferative diabetic retinopathy with macular edema, right eye: Secondary | ICD-10-CM | POA: Diagnosis not present

## 2022-03-10 DIAGNOSIS — E113412 Type 2 diabetes mellitus with severe nonproliferative diabetic retinopathy with macular edema, left eye: Secondary | ICD-10-CM | POA: Diagnosis not present

## 2022-03-15 NOTE — Progress Notes (Unsigned)
Requested by:  Lindell Spar, MD [address] [place],  [place] [address]  Reason for consultation: ***    History of Present Illness   [name] is a 56 y.o. (Apr 17, 1966) female who presents for evaluation of ***  Venous symptoms include: (aching, heavy, tired, throbbing, burning, itching, swelling, bleeding, ulcer)  *** Onset/duration:  ***  Occupation:  *** Aggravating factors: (sitting, standing) Alleviating factors: (elevation) Compression:  *** Helps:  *** Pain medications:  *** Previous vein procedures:  *** History of DVT:  ***  Past Medical History:  Diagnosis Date   Charcot's joint of foot, left    Depression    Diabetes mellitus without complication ([place])    Hyperlipidemia    Hypertension    Neuromuscular disorder ([place])    Schizo affective schizophrenia ([place])    abilify and pazil    Past Surgical History:  Procedure Laterality Date   CHOLECYSTECTOMY     COLONOSCOPY WITH PROPOFOL N/A 05/26/2019   Procedure: COLONOSCOPY WITH PROPOFOL;  Surgeon: Toledo, Benay Pike, MD;  Location: [facility];  Service: Gastroenterology;  Laterality: N/A;   ESOPHAGOGASTRODUODENOSCOPY (EGD) WITH PROPOFOL N/A 05/26/2019   Procedure: ESOPHAGOGASTRODUODENOSCOPY (EGD) WITH PROPOFOL;  Surgeon: Toledo, Benay Pike, MD;  Location: [facility];  Service: Gastroenterology;  Laterality: N/A;   IRRIGATION AND DEBRIDEMENT FOOT Right 02/26/2016   Procedure: RIGHT 2ND TOE DEBRIDEMENT;  Surgeon: Samara Deist, DPM;  Location: [facility];  Service: Podiatry;  Laterality: Right;   IRRIGATION AND DEBRIDEMENT FOOT Left 12/13/2018   Procedure: IRRIGATION AND DEBRIDEMENT FOOT;  Surgeon: Caroline More, DPM;  Location: [facility];  Service: Podiatry;  Laterality: Left;    Social History   Socioeconomic History   Marital status: Divorced    Spouse name: Not on file   Number of children: 2   Years of education: Not on file   Highest education level: Not on file  Occupational History    Not on file  Tobacco Use   Smoking status: Every Day    Packs/day: 1.00    Years: 30.00    Total pack years: 30.00    Types: Cigarettes    Last attempt to quit: 05/02/2009    Years since quitting: 12.8   Smokeless tobacco: Never  Vaping Use   Vaping Use: Never used  Substance and Sexual Activity   Alcohol use: No   Drug use: No   Sexual activity: Not Currently  Other Topics Concern   Not on file  Social History Narrative   At home with mom, 52. Lives in brothers house who moved out. Sister comes by every other day to check on patient and mom.   Social Determinants of Health   Financial Resource Strain: Low Risk  (11/04/2020)   Overall Financial Resource Strain (CARDIA)    Difficulty of Paying Living Expenses: Not hard at all  Food Insecurity: No Food Insecurity (01/16/2022)   Hunger Vital Sign    Worried About Running Out of Food in the Last Year: Never true    Ran Out of Food in the Last Year: Never true  Transportation Needs: No Transportation Needs (01/16/2022)   PRAPARE - Hydrologist (Medical): No    Lack of Transportation (Non-Medical): No  Physical Activity: Inactive (11/04/2020)   Exercise Vital Sign    Days of Exercise per Week: 0 days    Minutes of Exercise per Session: 0 min  Stress: No Stress Concern Present (11/04/2020)   [employer]  Occupational [employer] - Occupational Stress Questionnaire    Feeling of Stress : Only a little  Social Connections: Socially Isolated (11/04/2020)   Social Connection and Isolation Panel [NHANES]    Frequency of Communication with Friends and Family: More than three times a week    Frequency of Social Gatherings with Friends and Family: More than three times a week    Attends Religious Services: Never    Marine scientist or Organizations: No    Attends Archivist Meetings: Never    Marital Status: Divorced  Human resources officer Violence: Not At Risk (01/16/2022)   Humiliation, Afraid,  Rape, and Kick questionnaire    Fear of Current or Ex-Partner: No    Emotionally Abused: No    Physically Abused: No    Sexually Abused: No   *** Family History  Problem Relation Age of Onset   Breast cancer Cousin        maternal cousin   Hypertension Mother    Hyperthyroidism Mother    Dementia Mother    Prostate cancer Father    Diabetes Maternal Grandmother    Hypertension Maternal Grandmother    Cancer Maternal Grandmother        unknown type   Diabetes Maternal Grandfather    Hypertension Maternal Grandfather    Diabetes Paternal Grandmother    Hypertension Paternal Grandmother    Diabetes Paternal Grandfather    Hypertension Paternal Grandfather     Current Outpatient Medications  Medication Sig Dispense Refill   acetaminophen (TYLENOL) 325 MG tablet Take 2 tablets (650 mg total) by mouth every 6 (six) hours as needed for mild pain.     ARIPiprazole (ABILIFY) 15 MG tablet TAKE (1) TABLET BY MOUTH ONCE DAILY. 30 tablet 0   atorvastatin (LIPITOR) 40 MG tablet TAKE (1) TABLET BY MOUTH ONCE DAILY. 30 tablet 0   buPROPion (WELLBUTRIN XL) 300 MG 24 hr tablet TAKE ONE TABLET BY MOUTH EVERY MORNING. 30 tablet 0   colestipol (COLESTID) 1 g tablet TAKE (2) TABLETS BY MOUTH TWICE DAILY. 120 tablet 0   Continuous Blood Gluc Sensor (DEXCOM G7 SENSOR) MISC Please use it to check blood glucose 3 times before meals, at bedtime and as needed. 3 each 11   furosemide (LASIX) 40 MG tablet TAKE (1) TABLET BY MOUTH ONCE DAILY. 30 tablet 0   gabapentin (NEURONTIN) 300 MG capsule TAKE (1) TABLET DAILY AT BEDTIME. 30 capsule 0   glipiZIDE (GLUCOTROL XL) 5 MG 24 hr tablet Take 1 tablet (5 mg total) by mouth daily with breakfast. 30 tablet 2   ibuprofen (ADVIL) 200 MG tablet Take 400 mg by mouth every 6 (six) hours as needed for mild pain, headache or fever.     insulin glargine (LANTUS SOLOSTAR) 100 UNIT/ML Solostar Pen INJECT 30 UNITS INTO THE SKIN ONCE DAILY AT BEDTIME 15 mL 0    losartan-hydrochlorothiazide (HYZAAR) 50-12.5 MG tablet TAKE (1) TABLET BY MOUTH ONCE DAILY. 30 tablet 0   metoprolol succinate (TOPROL-XL) 50 MG 24 hr tablet TAKE (1) TABLET BY MOUTH ONCE DAILY. TAKE WITH OR IMMEDIATELY FOLLOWING A MEAL 30 tablet 0   Misc. [facility]) MISC Use it to ambulate short distance. 1 each 0   mupirocin ointment (BACTROBAN) 2 % Apply 1 Application topically 2 (two) times daily. 22 g 0   nicotine (NICODERM CQ - DOSED IN MG/24 HOURS) 21 mg/24hr patch Place 1 patch (21 mg total) onto the skin daily. 28 patch 0   pantoprazole (PROTONIX) 40 MG  tablet TAKE (1) TABLET BY MOUTH ONCE DAILY. 30 tablet 0   traZODone (DESYREL) 50 MG tablet Take 1 tablet (50 mg total) by mouth at bedtime as needed. for sleep (Patient taking differently: Take 50 mg by mouth at bedtime as needed for sleep.) 30 tablet 3   No current facility-administered medications for this visit.    No Known Allergies  ***REVIEW OF SYSTEMS (negative unless checked):   Cardiac:  '[]'$  Chest pain or chest pressure? '[]'$  Shortness of breath upon activity? '[]'$  Shortness of breath when lying flat? '[]'$  Irregular heart rhythm?  Vascular:  '[]'$  Pain in calf, thigh, or hip brought on by walking? '[]'$  Pain in feet at night that wakes you up from your sleep? '[]'$  Blood clot in your veins? '[]'$  Leg swelling?  Pulmonary:  '[]'$  Oxygen at home? '[]'$  Productive cough? '[]'$  Wheezing?  Neurologic:  '[]'$  Sudden weakness in arms or legs? '[]'$  Sudden numbness in arms or legs? '[]'$  Sudden onset of difficult speaking or slurred speech? '[]'$  Temporary loss of vision in one eye? '[]'$  Problems with dizziness?  Gastrointestinal:  '[]'$  Blood in stool? '[]'$  Vomited blood?  Genitourinary:  '[]'$  Burning when urinating? '[]'$  Blood in urine?  Psychiatric:  '[]'$  Major depression  Hematologic:  '[]'$  Bleeding problems? '[]'$  Problems with blood clotting?  Dermatologic:  '[]'$  Rashes or ulcers?  Constitutional:  '[]'$  Fever or chills?  Ear/Nose/Throat:  '[]'$   Change in hearing? '[]'$  Nose bleeds? '[]'$  Sore throat?  Musculoskeletal:  '[]'$  Back pain? '[]'$  Joint pain? '[]'$  Muscle pain?   Physical Examination    There were no vitals filed for this visit. There is no height or weight on file to calculate BMI.  General:  WDWN in NAD; vital signs documented above Gait: Not observed HENT: WNL, normocephalic Pulmonary: normal non-labored breathing , without Rales, rhonchi,  wheezing Cardiac: {Desc; regular/irreg:14544} HR, without  Murmurs {With/Without:20273} carotid bruit*** Abdomen: soft, NT, no masses Skin: {With/Without:20273} rashes Vascular Exam/Pulses:  Right Left  Radial {Exam; arterial pulse strength 0-4:30167} {Exam; arterial pulse strength 0-4:30167}  Ulnar {Exam; arterial pulse strength 0-4:30167} {Exam; arterial pulse strength 0-4:30167}  Femoral {Exam; arterial pulse strength 0-4:30167} {Exam; arterial pulse strength 0-4:30167}  Popliteal {Exam; arterial pulse strength 0-4:30167} {Exam; arterial pulse strength 0-4:30167}  DP {Exam; arterial pulse strength 0-4:30167} {Exam; arterial pulse strength 0-4:30167}  PT {Exam; arterial pulse strength 0-4:30167} {Exam; arterial pulse strength 0-4:30167}   Extremities: {With/Without:20273} varicose veins, {With/Without:20273} reticular veins, {With/Without:20273} edema, {With/Without:20273} stasis pigmentation, {With/Without:20273} lipodermatosclerosis, {With/Without:20273} ulcers Musculoskeletal: no muscle wasting or atrophy  Neurologic: A&O X 3;  No focal weakness or paresthesias are detected Psychiatric:  The pt has {Desc; normal/abnormal:11317::"Normal"} affect.  Non-invasive Vascular Imaging   BLE Venous Insufficiency Duplex (***):  RLE:  *** DVT and SVT,  *** GSV reflux ***, GSV diameter *** *** SSV reflux ***, *** deep venous reflux  LLE: *** DVT and SVT,  *** GSV reflux ***,  GSV diameter *** *** SSV reflux ***, *** deep venous reflux   Medical Decision Making   ALSIE YOUNES is a 56 y.o. female who presents with: ***LE chronic venous insufficiency, ***varicose veins with complications  Based on the patient's history and examination, I recommend: ***. I discussed with the patient the use of her 20-30 mm thigh high compression stockings and need for 3 month trial of such. The patient will follow up in 3 months with Dr. [name] Thank you for allowing [place] to participate in this patient's care.   Karoline Caldwell, PA-C [facility] [place] Office: [phone]  03/15/2022, 11:31 AM  Clinic MD: Yetta Barre

## 2022-03-16 ENCOUNTER — Ambulatory Visit: Payer: Medicare Other

## 2022-03-21 ENCOUNTER — Telehealth: Payer: Self-pay | Admitting: Internal Medicine

## 2022-03-21 NOTE — Telephone Encounter (Signed)
Patient called asking why did Dr Posey Pronto send a referral in for physiology doctor she did not ask the provider to do so. Please return patient call at [phone].

## 2022-03-21 NOTE — Telephone Encounter (Signed)
Patient made aware of reasoning why referral was made.

## 2022-03-30 ENCOUNTER — Ambulatory Visit (INDEPENDENT_AMBULATORY_CARE_PROVIDER_SITE_OTHER): Payer: Medicare Other | Admitting: Podiatry

## 2022-03-30 DIAGNOSIS — M79674 Pain in right toe(s): Secondary | ICD-10-CM

## 2022-03-30 DIAGNOSIS — M79675 Pain in left toe(s): Secondary | ICD-10-CM | POA: Diagnosis not present

## 2022-03-30 DIAGNOSIS — B351 Tinea unguium: Secondary | ICD-10-CM

## 2022-03-30 NOTE — Progress Notes (Signed)
  Subjective:  Patient ID: [name], female    DOB: July 07, 1965,  MRN: [id number]  Chief Complaint  Patient presents with   Nail Problem    Thick painful toenails, 3 month follow up    56 y.o. female presents with the above complaint. History confirmed with patient.  Returns for follow-up with thickened elongated painful toenails  Objective:  Physical Exam: warm, good capillary refill, no trophic changes or ulcerative lesions, normal DP and PT pulses, and abnormal sensory exam with loss of protective sensation. Left Foot: dystrophic yellowed discolored nail plates with subungual debris Right Foot: dystrophic yellowed discolored nail plates with subungual debris   Assessment:   1. Pain due to onychomycosis of toenails of both feet      Plan:  Patient was evaluated and treated and all questions answered.  Patient educated on diabetes. Discussed proper diabetic foot care and discussed risks and complications of disease. Educated patient in depth on reasons to return to the office immediately should he/she discover anything concerning or new on the feet. All questions answered. Discussed proper shoes as well.   Discussed the etiology and treatment options for the condition in detail with the patient. Educated patient on the topical and oral treatment options for mycotic nails. Recommended debridement of the nails today. Sharp and mechanical debridement performed of all painful and mycotic nails today. Nails debrided in length and thickness using a nail nipper to level of comfort. Discussed treatment options including appropriate shoe gear. Follow up as needed for painful nails.   Return in about 3 months (around 06/30/2022) for at risk diabetic foot care.

## 2022-04-04 ENCOUNTER — Ambulatory Visit (HOSPITAL_COMMUNITY): Payer: Medicare Other | Admitting: Psychiatry

## 2022-04-05 ENCOUNTER — Other Ambulatory Visit: Payer: Self-pay | Admitting: Internal Medicine

## 2022-04-05 ENCOUNTER — Ambulatory Visit (INDEPENDENT_AMBULATORY_CARE_PROVIDER_SITE_OTHER): Payer: Medicare Other | Admitting: Internal Medicine

## 2022-04-05 ENCOUNTER — Encounter: Payer: Self-pay | Admitting: Internal Medicine

## 2022-04-05 VITALS — BP 156/94 | HR 89 | Ht 70.0 in | Wt 273.6 lb

## 2022-04-05 DIAGNOSIS — E1149 Type 2 diabetes mellitus with other diabetic neurological complication: Secondary | ICD-10-CM

## 2022-04-05 DIAGNOSIS — L97929 Non-pressure chronic ulcer of unspecified part of left lower leg with unspecified severity: Secondary | ICD-10-CM

## 2022-04-05 DIAGNOSIS — F251 Schizoaffective disorder, depressive type: Secondary | ICD-10-CM

## 2022-04-05 DIAGNOSIS — I10 Essential (primary) hypertension: Secondary | ICD-10-CM | POA: Diagnosis not present

## 2022-04-05 DIAGNOSIS — I83029 Varicose veins of left lower extremity with ulcer of unspecified site: Secondary | ICD-10-CM

## 2022-04-05 DIAGNOSIS — N179 Acute kidney failure, unspecified: Secondary | ICD-10-CM | POA: Diagnosis not present

## 2022-04-05 DIAGNOSIS — Z122 Encounter for screening for malignant neoplasm of respiratory organs: Secondary | ICD-10-CM | POA: Diagnosis not present

## 2022-04-05 MED ORDER — AMLODIPINE BESYLATE 5 MG PO TABS: 5.0000 mg | ORAL_TABLET | Freq: Every day | ORAL | 0 refills | Status: DC

## 2022-04-05 MED ORDER — GLIPIZIDE ER 5 MG PO TB24: 5.0000 mg | ORAL_TABLET | Freq: Every day | ORAL | 2 refills | Status: DC

## 2022-04-05 NOTE — Assessment & Plan Note (Signed)
Has left leg ulcer Has history of uncontrolled type II DM and chronic leg swelling likely due to chronic venous insufficiency Mupirocin ointment for secondary bacterial ppx She is at high risk of complications from venous ulcer Urgent referral to vascular surgery had been sent, but her ulcer has healed well now

## 2022-04-05 NOTE — Patient Instructions (Signed)
Please start taking Glipizide for diabetes.  Please start taking Amlodipine for blood pressure.  Please continue taking other medications as prescribed.  Please follow low carb diet and perform moderate exercise/walking as tolerated.

## 2022-04-05 NOTE — Assessment & Plan Note (Signed)
On Abilify and Wellbutrin, although unclear whether she is taking them On Trazodone Was followed by Psychiatry, lost to follow up, referred to [facility] clinic - needs to have follow up visit and clarification of medications

## 2022-04-05 NOTE — Assessment & Plan Note (Signed)
BP Readings from Last 1 Encounters:  04/05/22 (!) 156/94   Uncontrolled Was well-controlled with Losartan-HCTZ and Metoprolol Also takes Lasix for LE swelling, advised to take Lasix 40 mg daily only for now Added Amlodipine 5 mg QD Counseled for compliance with the medications Advised DASH diet and moderate exercise/walking, at least 150 mins/week

## 2022-04-05 NOTE — Assessment & Plan Note (Signed)
HbA1C: 8.1 Uncontrolled currently, better than prior On Lantus 30 U QD, held Metformin 1000 mg BID due to GFR < 30 Recheck BMP Added glipizide 5 mg daily, but needs to start it Referred to Endocrinology Advised to follow diabetic diet On statin and ARB F/u CMP and lipid panel Diabetic eye exam: Advised to follow up with Ophthalmology for diabetic eye exam

## 2022-04-05 NOTE — Assessment & Plan Note (Signed)
AKI on CKD Likely due to dehydration Improved with gentle hydration Recently had metformin and additional 20 mg dose of Lasix Check BMP

## 2022-04-05 NOTE — Progress Notes (Signed)
Established Patient Office Visit  Subjective:  Patient ID: [name], female    DOB: 08-04-1965  Age: 56 y.o. MRN: [id number]  CC:  Chief Complaint  Patient presents with   Follow-up    HPI [name] is a 56 y.o. female with past medical history of HTN, uncontrolled type 2 DM with neuropathy, sigmoid colon ca. S/p partial colectomy, schizoaffective disorder and obesity who presents for f/u of her chronic medical conditions.  AKI: She was found to have AKI on CKD in the last visit. She was advised to hold metformin and Lasix additional dose. She has stopped taking metformin and additional dose of Lasix.  Currently, she gets pillpack to her pharmacy and has still been getting metformin instead of glipizide.  Left leg ulcer: She has chronic leg swelling, but her leg ulcer has improved compared to the last visit.  She is going to see vascular surgery for evaluation of chronic leg swelling and leg ulcer (although now has improved).  Type II DM: She has resumed taking Lantus, but has not received glipizide yet.  She agrees to pick it up from the pharmacy.  She is still getting metformin through pill pack, but she has been removing it.  She has not brought blood glucose log, but reports that it has been running above 200.  HTN: Her BP was elevated today.  She has been taking losartan-HCTZ 50-12.5 mg daily.  She has had recent worsening of kidney function and her dose of Lasix was recently reduced.  She denies any headache, dizziness, chest pain or palpitations.   Past Medical History:  Diagnosis Date   Charcot's joint of foot, left    Depression    Diabetes mellitus without complication ([place])    Hyperlipidemia    Hypertension    Neuromuscular disorder ([place])    Schizo affective schizophrenia ([place])    abilify and pazil    Past Surgical History:  Procedure Laterality Date   CHOLECYSTECTOMY     COLONOSCOPY WITH PROPOFOL N/A 05/26/2019   Procedure: COLONOSCOPY WITH  PROPOFOL;  Surgeon: Toledo, Benay Pike, MD;  Location: [facility];  Service: Gastroenterology;  Laterality: N/A;   ESOPHAGOGASTRODUODENOSCOPY (EGD) WITH PROPOFOL N/A 05/26/2019   Procedure: ESOPHAGOGASTRODUODENOSCOPY (EGD) WITH PROPOFOL;  Surgeon: Toledo, Benay Pike, MD;  Location: [facility];  Service: Gastroenterology;  Laterality: N/A;   IRRIGATION AND DEBRIDEMENT FOOT Right 02/26/2016   Procedure: RIGHT 2ND TOE DEBRIDEMENT;  Surgeon: Samara Deist, DPM;  Location: [facility];  Service: Podiatry;  Laterality: Right;   IRRIGATION AND DEBRIDEMENT FOOT Left 12/13/2018   Procedure: IRRIGATION AND DEBRIDEMENT FOOT;  Surgeon: Caroline More, DPM;  Location: [facility];  Service: Podiatry;  Laterality: Left;    Family History  Problem Relation Age of Onset   Breast cancer Cousin        maternal cousin   Hypertension Mother    Hyperthyroidism Mother    Dementia Mother    Prostate cancer Father    Diabetes Maternal Grandmother    Hypertension Maternal Grandmother    Cancer Maternal Grandmother        unknown type   Diabetes Maternal Grandfather    Hypertension Maternal Grandfather    Diabetes Paternal Grandmother    Hypertension Paternal Grandmother    Diabetes Paternal Grandfather    Hypertension Paternal Grandfather     Social History   Socioeconomic History   Marital status: Divorced    Spouse name: Not on file   Number of children: 2  Years of education: Not on file   Highest education level: Not on file  Occupational History   Not on file  Tobacco Use   Smoking status: Former    Packs/day: 1.00    Years: 30.00    Total pack years: 30.00    Types: Cigarettes    Quit date: 01/30/2022    Years since quitting: 0.1   Smokeless tobacco: Never  Vaping Use   Vaping Use: Never used  Substance and Sexual Activity   Alcohol use: No   Drug use: No   Sexual activity: Not Currently  Other Topics Concern   Not on file  Social History Narrative   At home with mom, 26. Lives in  brothers house who moved out. Sister comes by every other day to check on patient and mom.   Social Determinants of Health   Financial Resource Strain: Low Risk  (11/04/2020)   Overall Financial Resource Strain (CARDIA)    Difficulty of Paying Living Expenses: Not hard at all  Food Insecurity: No Food Insecurity (01/16/2022)   Hunger Vital Sign    Worried About Running Out of Food in the Last Year: Never true    Ran Out of Food in the Last Year: Never true  Transportation Needs: No Transportation Needs (01/16/2022)   PRAPARE - Hydrologist (Medical): No    Lack of Transportation (Non-Medical): No  Physical Activity: Inactive (11/04/2020)   Exercise Vital Sign    Days of Exercise per Week: 0 days    Minutes of Exercise per Session: 0 min  Stress: No Stress Concern Present (11/04/2020)   [place]    Feeling of Stress : Only a little  Social Connections: Socially Isolated (11/04/2020)   Social Connection and Isolation Panel [NHANES]    Frequency of Communication with Friends and Family: More than three times a week    Frequency of Social Gatherings with Friends and Family: More than three times a week    Attends Religious Services: Never    Marine scientist or Organizations: No    Attends Archivist Meetings: Never    Marital Status: Divorced  Human resources officer Violence: Not At Risk (01/16/2022)   Humiliation, Afraid, Rape, and Kick questionnaire    Fear of Current or Ex-Partner: No    Emotionally Abused: No    Physically Abused: No    Sexually Abused: No    Outpatient Medications Prior to Visit  Medication Sig Dispense Refill   metFORMIN (GLUCOPHAGE) 1000 MG tablet Take 1,000 mg by mouth 2 (two) times daily.     acetaminophen (TYLENOL) 325 MG tablet Take 2 tablets (650 mg total) by mouth every 6 (six) hours as needed for mild pain.     ARIPiprazole (ABILIFY) 15 MG tablet TAKE  (1) TABLET BY MOUTH ONCE DAILY. 30 tablet 0   atorvastatin (LIPITOR) 40 MG tablet TAKE (1) TABLET BY MOUTH ONCE DAILY. 30 tablet 0   buPROPion (WELLBUTRIN XL) 300 MG 24 hr tablet TAKE ONE TABLET BY MOUTH EVERY MORNING. 30 tablet 0   colestipol (COLESTID) 1 g tablet TAKE (2) TABLETS BY MOUTH TWICE DAILY. 120 tablet 0   Continuous Blood Gluc Sensor (DEXCOM G7 SENSOR) MISC Please use it to check blood glucose 3 times before meals, at bedtime and as needed. 3 each 11   furosemide (LASIX) 40 MG tablet TAKE (1) TABLET BY MOUTH ONCE DAILY. 30 tablet 0   gabapentin (  NEURONTIN) 300 MG capsule TAKE (1) TABLET DAILY AT BEDTIME. 30 capsule 0   ibuprofen (ADVIL) 200 MG tablet Take 400 mg by mouth every 6 (six) hours as needed for mild pain, headache or fever.     insulin glargine (LANTUS SOLOSTAR) 100 UNIT/ML Solostar Pen INJECT 30 UNITS INTO THE SKIN ONCE DAILY AT BEDTIME 15 mL 0   losartan-hydrochlorothiazide (HYZAAR) 50-12.5 MG tablet TAKE (1) TABLET BY MOUTH ONCE DAILY. 30 tablet 0   metoprolol succinate (TOPROL-XL) 50 MG 24 hr tablet TAKE (1) TABLET BY MOUTH ONCE DAILY. TAKE WITH OR IMMEDIATELY FOLLOWING A MEAL 30 tablet 0   Misc. Devices [facility]) MISC Use it to ambulate short distance. 1 each 0   mupirocin ointment (BACTROBAN) 2 % Apply 1 Application topically 2 (two) times daily. 22 g 0   nicotine (NICODERM CQ - DOSED IN MG/24 HOURS) 21 mg/24hr patch Place 1 patch (21 mg total) onto the skin daily. 28 patch 0   pantoprazole (PROTONIX) 40 MG tablet TAKE (1) TABLET BY MOUTH ONCE DAILY. 30 tablet 0   traZODone (DESYREL) 50 MG tablet Take 1 tablet (50 mg total) by mouth at bedtime as needed. for sleep (Patient taking differently: Take 50 mg by mouth at bedtime as needed for sleep.) 30 tablet 3   glipiZIDE (GLUCOTROL XL) 5 MG 24 hr tablet Take 1 tablet (5 mg total) by mouth daily with breakfast. 30 tablet 2   No facility-administered medications prior to visit.    No Known Allergies  ROS Review of  Systems  Constitutional:  Negative for chills and fever.  HENT:  Negative for congestion, sinus pressure, sinus pain and sore throat.   Eyes:  Negative for pain and discharge.  Respiratory:  Negative for cough and shortness of breath.   Cardiovascular:  Positive for leg swelling. Negative for chest pain and palpitations.  Gastrointestinal:  Negative for abdominal pain, constipation, nausea and vomiting.  Endocrine: Negative for polydipsia and polyuria.  Genitourinary:  Negative for dysuria and hematuria.  Musculoskeletal:  Positive for arthralgias and gait problem. Negative for neck pain and neck stiffness.  Skin:  Negative for rash.  Neurological:  Negative for dizziness and weakness.  Psychiatric/Behavioral:  Negative for agitation and behavioral problems. The patient is nervous/anxious.       Objective:    Physical Exam Vitals reviewed.  Constitutional:      General: She is not in acute distress.    Appearance: She is obese. She is not diaphoretic.     Comments: Uses walker  HENT:     Head: Normocephalic and atraumatic.     Nose: Nose normal.     Mouth/Throat:     Mouth: Mucous membranes are moist.  Eyes:     General: No scleral icterus.    Extraocular Movements: Extraocular movements intact.  Cardiovascular:     Rate and Rhythm: Normal rate and regular rhythm.     Heart sounds: Normal heart sounds. No murmur heard. Pulmonary:     Breath sounds: Normal breath sounds. No wheezing or rales.  Musculoskeletal:     Cervical back: Neck supple. No tenderness.     Right lower leg: Edema (1+) present.     Left lower leg: Edema (2+) present.  Skin:    General: Skin is warm.     Comments: Left leg ulceration, about 2 cm in diameter, well-healing  Neurological:     General: No focal deficit present.     Mental Status: She is alert and oriented to  person, place, and time.     Sensory: Sensory deficit (B/l LE) present.     Motor: Weakness (3/5 - b/l LE) present.  Psychiatric:         Mood and Affect: Mood normal.        Behavior: Behavior normal.     BP (!) 156/94 (BP Location: Right Arm, Cuff Size: Normal)   Pulse 89   Ht _0  (1.778 m)   Wt 273 lb 9.6 oz (124.1 kg)   SpO2 96%   BMI 39.26 kg/m  Wt Readings from Last 3 Encounters:  04/05/22 273 lb 9.6 oz (124.1 kg)  02/18/22 259 lb 3.2 oz (117.6 kg)  01/27/22 256 lb 6.4 oz (116.3 kg)    Lab Results  Component Value Date   TSH 2.140 11/23/2021   Lab Results  Component Value Date   WBC 11.2 (H) 01/27/2022   HGB 10.9 (L) 01/27/2022   HCT 31.9 (L) 01/27/2022   MCV 88 01/27/2022   PLT 319 01/27/2022   Lab Results  Component Value Date   NA 138 02/18/2022   K 5.2 02/18/2022   CO2 21 02/18/2022   GLUCOSE 282 (H) 02/18/2022   BUN 34 (H) 02/18/2022   CREATININE 2.06 (H) 02/18/2022   BILITOT 0.6 01/18/2022   ALKPHOS 127 (H) 01/18/2022   AST 14 (L) 01/18/2022   ALT 17 01/18/2022   PROT 7.4 01/18/2022   ALBUMIN 3.6 01/18/2022   CALCIUM 9.5 02/18/2022   ANIONGAP 6 01/18/2022   EGFR 27 (L) 02/18/2022   Lab Results  Component Value Date   CHOL 168 11/23/2021   Lab Results  Component Value Date   HDL 40 11/23/2021   Lab Results  Component Value Date   LDLCALC 77 11/23/2021   Lab Results  Component Value Date   TRIG 313 (H) 11/23/2021   Lab Results  Component Value Date   CHOLHDL 4.2 11/23/2021   Lab Results  Component Value Date   HGBA1C 8.1 (H) 01/16/2022      Assessment & Plan:   Problem List Items Addressed This Visit       Cardiovascular and Mediastinum   Essential hypertension (Chronic)    BP Readings from Last 1 Encounters:  04/05/22 (!) 156/94  Uncontrolled Was well-controlled with Losartan-HCTZ and Metoprolol Also takes Lasix for LE swelling, advised to take Lasix 40 mg daily only for now Added Amlodipine 5 mg QD Counseled for compliance with the medications Advised DASH diet and moderate exercise/walking, at least 150 mins/week      Relevant Medications    amLODipine (NORVASC) 5 MG tablet     Endocrine   Type 2 diabetes mellitus with neurological complications (HCC) - Primary    HbA1C: 8.1 Uncontrolled currently, better than prior On Lantus 30 U QD, held Metformin 1000 mg BID due to GFR < 30 Recheck BMP Added glipizide 5 mg daily, but needs to start it Referred to Endocrinology Advised to follow diabetic diet On statin and ARB F/u CMP and lipid panel Diabetic eye exam: Advised to follow up with Ophthalmology for diabetic eye exam      Relevant Medications   glipiZIDE (GLUCOTROL XL) 5 MG 24 hr tablet   Other Relevant Orders   Basic Metabolic Panel (BMET)     Musculoskeletal and Integument   Venous ulcer of left leg (HCC)    Has left leg ulcer Has history of uncontrolled type II DM and chronic leg swelling likely due to chronic venous insufficiency Mupirocin ointment for  secondary bacterial ppx She is at high risk of complications from venous ulcer Urgent referral to vascular surgery had been sent, but her ulcer has healed well now        Genitourinary   AKI (acute kidney injury) ([place])    AKI on CKD Likely due to dehydration Improved with gentle hydration Recently had metformin and additional 20 mg dose of Lasix Check BMP      Relevant Orders   Basic Metabolic Panel (BMET)     Other   Schizoaffective disorder ([place])    On Abilify and Wellbutrin, although unclear whether she is taking them On Trazodone Was followed by Psychiatry, lost to follow up, referred to [facility] clinic - needs to have follow up visit and clarification of medications      Other Visit Diagnoses     Screening for lung cancer       Relevant Orders   CT CHEST LUNG CANCER SCREENING LOW DOSE WO CONTRAST       Meds ordered this encounter  Medications   glipiZIDE (GLUCOTROL XL) 5 MG 24 hr tablet    Sig: Take 1 tablet (5 mg total) by mouth daily with breakfast.    Dispense:  30 tablet    Refill:  2    PLEASE REMOVE METFORMIN FROM PILL PACK.    amLODipine (NORVASC) 5 MG tablet    Sig: Take 1 tablet (5 mg total) by mouth daily.    Dispense:  90 tablet    Refill:  0    Follow-up: Return in about 2 months (around 06/06/2022) for HTN and DM.    Lindell Spar, MD

## 2022-04-06 ENCOUNTER — Ambulatory Visit: Payer: Medicare Other | Admitting: "Endocrinology

## 2022-04-06 NOTE — Progress Notes (Unsigned)
Requested by:  Lindell Spar, MD [address] [place],  [place] [address]  Reason for consultation: lower extremity edema    History of Present Illness   [name] is a 56 y.o. ([birth year]) female who presents for evaluation of lower extremity edema.  The patient states that she has had a history of lower leg swelling in the past, but it has worsened over the past 4 weeks.  She has previously been placed on Lasix by her primary care provider and that has helped with her swelling before.  Her left leg swelling is usually worse than her right leg and extends from her knee to ankle.  This causes her legs to feel achy and heavy.  They seem to be worse by the end of the day after a long time of sitting.  She has tried compression stockings from [employer] before, however they were too tight so she gave up on them.  She tries to elevate her legs in the recliner, but her feet are not higher than her heart.  She has no previous vein procedures.  She has no history of DVT.  Of note she does have Charcot's foot on the left side, which her primary care thought potentially could contribute to her left foot swelling.  She will be seeing a podiatrist in a month about it.  Past Medical History:  Diagnosis Date   Charcot's joint of foot, left    Depression    Diabetes mellitus without complication ([place])    Hyperlipidemia    Hypertension    Neuromuscular disorder ([employer])    Schizo affective schizophrenia ([place])    abilify and pazil    Past Surgical History:  Procedure Laterality Date   CHOLECYSTECTOMY     COLONOSCOPY WITH PROPOFOL N/A 05/26/2019   Procedure: COLONOSCOPY WITH PROPOFOL;  Surgeon: Toledo, Benay Pike, MD;  Location: [facility];  Service: Gastroenterology;  Laterality: N/A;   ESOPHAGOGASTRODUODENOSCOPY (EGD) WITH PROPOFOL N/A 05/26/2019   Procedure: ESOPHAGOGASTRODUODENOSCOPY (EGD) WITH PROPOFOL;  Surgeon: Toledo, Benay Pike, MD;  Location: [facility];  Service:  Gastroenterology;  Laterality: N/A;   IRRIGATION AND DEBRIDEMENT FOOT Right 02/26/2016   Procedure: RIGHT 2ND TOE DEBRIDEMENT;  Surgeon: Samara Deist, DPM;  Location: [facility];  Service: Podiatry;  Laterality: Right;   IRRIGATION AND DEBRIDEMENT FOOT Left 12/13/2018   Procedure: IRRIGATION AND DEBRIDEMENT FOOT;  Surgeon: Caroline More, DPM;  Location: [facility];  Service: Podiatry;  Laterality: Left;    Social History   Socioeconomic History   Marital status: Divorced    Spouse name: Not on file   Number of children: 2   Years of education: Not on file   Highest education level: Not on file  Occupational History   Not on file  Tobacco Use   Smoking status: Former    Packs/day: 1.00    Years: 30.00    Total pack years: 30.00    Types: Cigarettes    Quit date: 01/30/2022    Years since quitting: 0.1   Smokeless tobacco: Never  Vaping Use   Vaping Use: Never used  Substance and Sexual Activity   Alcohol use: No   Drug use: No   Sexual activity: Not Currently  Other Topics Concern   Not on file  Social History Narrative   At home with mom, 65. Lives in brothers house who moved out. Sister comes by every other day to check on patient and mom.   Social Determinants of Health  Financial Resource Strain: Low Risk  (11/04/2020)   Overall Financial Resource Strain (CARDIA)    Difficulty of Paying Living Expenses: Not hard at all  Food Insecurity: No Food Insecurity (01/16/2022)   Hunger Vital Sign    Worried About Running Out of Food in the Last Year: Never true    Ran Out of Food in the Last Year: Never true  Transportation Needs: No Transportation Needs (01/16/2022)   PRAPARE - Hydrologist (Medical): No    Lack of Transportation (Non-Medical): No  Physical Activity: Inactive (11/04/2020)   Exercise Vital Sign    Days of Exercise per Week: 0 days    Minutes of Exercise per Session: 0 min  Stress: No Stress Concern Present (11/04/2020)   [place]    Feeling of Stress : Only a little  Social Connections: Socially Isolated (11/04/2020)   Social Connection and Isolation Panel [NHANES]    Frequency of Communication with Friends and Family: More than three times a week    Frequency of Social Gatherings with Friends and Family: More than three times a week    Attends Religious Services: Never    Marine scientist or Organizations: No    Attends Archivist Meetings: Never    Marital Status: Divorced  Human resources officer Violence: Not At Risk (01/16/2022)   Humiliation, Afraid, Rape, and Kick questionnaire    Fear of Current or Ex-Partner: No    Emotionally Abused: No    Physically Abused: No    Sexually Abused: No    Family History  Problem Relation Age of Onset   Breast cancer Cousin        maternal cousin   Hypertension Mother    Hyperthyroidism Mother    Dementia Mother    Prostate cancer Father    Diabetes Maternal Grandmother    Hypertension Maternal Grandmother    Cancer Maternal Grandmother        unknown type   Diabetes Maternal Grandfather    Hypertension Maternal Grandfather    Diabetes Paternal Grandmother    Hypertension Paternal Grandmother    Diabetes Paternal Grandfather    Hypertension Paternal Grandfather     Current Outpatient Medications  Medication Sig Dispense Refill   acetaminophen (TYLENOL) 325 MG tablet Take 2 tablets (650 mg total) by mouth every 6 (six) hours as needed for mild pain.     amLODipine (NORVASC) 5 MG tablet Take 1 tablet (5 mg total) by mouth daily. 90 tablet 0   ARIPiprazole (ABILIFY) 15 MG tablet TAKE (1) TABLET BY MOUTH ONCE DAILY. 30 tablet 0   atorvastatin (LIPITOR) 40 MG tablet TAKE (1) TABLET BY MOUTH ONCE DAILY. 30 tablet 0   buPROPion (WELLBUTRIN XL) 300 MG 24 hr tablet TAKE ONE TABLET BY MOUTH EVERY MORNING. 30 tablet 0   colestipol (COLESTID) 1 g tablet TAKE (2) TABLETS BY MOUTH TWICE DAILY. 120  tablet 0   Continuous Blood Gluc Sensor (DEXCOM G7 SENSOR) MISC Please use it to check blood glucose 3 times before meals, at bedtime and as needed. 3 each 11   furosemide (LASIX) 40 MG tablet TAKE (1) TABLET BY MOUTH ONCE DAILY. 30 tablet 0   gabapentin (NEURONTIN) 300 MG capsule TAKE (1) TABLET DAILY AT BEDTIME. 30 capsule 0   glipiZIDE (GLUCOTROL XL) 5 MG 24 hr tablet Take 1 tablet (5 mg total) by mouth daily with breakfast. 30 tablet 2   ibuprofen (  ADVIL) 200 MG tablet Take 400 mg by mouth every 6 (six) hours as needed for mild pain, headache or fever.     insulin glargine (LANTUS SOLOSTAR) 100 UNIT/ML Solostar Pen INJECT 30 UNITS INTO THE SKIN ONCE DAILY AT BEDTIME 15 mL 0   losartan-hydrochlorothiazide (HYZAAR) 50-12.5 MG tablet TAKE (1) TABLET BY MOUTH ONCE DAILY. 30 tablet 0   metoprolol succinate (TOPROL-XL) 50 MG 24 hr tablet TAKE (1) TABLET BY MOUTH ONCE DAILY. TAKE WITH OR IMMEDIATELY FOLLOWING A MEAL 30 tablet 0   Misc. Devices [facility]) MISC Use it to ambulate short distance. 1 each 0   mupirocin ointment (BACTROBAN) 2 % Apply 1 Application topically 2 (two) times daily. 22 g 0   nicotine (NICODERM CQ - DOSED IN MG/24 HOURS) 21 mg/24hr patch Place 1 patch (21 mg total) onto the skin daily. 28 patch 0   pantoprazole (PROTONIX) 40 MG tablet TAKE (1) TABLET BY MOUTH ONCE DAILY. 30 tablet 0   traZODone (DESYREL) 50 MG tablet Take 1 tablet (50 mg total) by mouth at bedtime as needed. for sleep (Patient taking differently: Take 50 mg by mouth at bedtime as needed for sleep.) 30 tablet 3   No current facility-administered medications for this visit.    No Known Allergies  REVIEW OF SYSTEMS (negative unless checked):   Cardiac:  '[]'$  Chest pain or chest pressure? '[]'$  Shortness of breath upon activity? '[]'$  Shortness of breath when lying flat? '[]'$  Irregular heart rhythm?  Vascular:  '[]'$  Pain in calf, thigh, or hip brought on by walking? '[]'$  Pain in feet at night that wakes you up from  your sleep? '[]'$  Blood clot in your veins? '[x]'$  Leg swelling?  Pulmonary:  '[]'$  Oxygen at home? '[]'$  Productive cough? '[]'$  Wheezing?  Neurologic:  '[]'$  Sudden weakness in arms or legs? '[]'$  Sudden numbness in arms or legs? '[]'$  Sudden onset of difficult speaking or slurred speech? '[]'$  Temporary loss of vision in one eye? '[]'$  Problems with dizziness?  Gastrointestinal:  '[]'$  Blood in stool? '[]'$  Vomited blood?  Genitourinary:  '[]'$  Burning when urinating? '[]'$  Blood in urine?  Psychiatric:  '[]'$  Major depression  Hematologic:  '[]'$  Bleeding problems? '[]'$  Problems with blood clotting?  Dermatologic:  '[]'$  Rashes or ulcers?  Constitutional:  '[]'$  Fever or chills?  Ear/Nose/Throat:  '[]'$  Change in hearing? '[]'$  Nose bleeds? '[]'$  Sore throat?  Musculoskeletal:  '[]'$  Back pain? '[]'$  Joint pain? '[]'$  Muscle pain?   Physical Examination     Vitals:   04/07/22 1319  BP: (!) 174/83  Pulse: 87  Temp: 97.8 F (36.6 C)  TempSrc: Temporal  SpO2: 100%  Weight: 274 lb (124.3 kg)  Height: '5\' 10"'$  (1.778 m)   Body mass index is 39.31 kg/m.  General:  WDWN in NAD; vital signs documented above Gait: Not observed HENT: WNL, normocephalic Pulmonary: normal non-labored breathing, CTAB Cardiac: Regular rate and rhythm, without carotid bruit Abdomen: soft, NT, no masses Skin: without rashes Vascular Exam/Pulses: Palpable DP pulses bilaterally Extremities: without varicose veins, without reticular veins, with edema, without stasis pigmentation, without lipodermatosclerosis, without ulcers Musculoskeletal: no muscle wasting or atrophy  Neurologic: A&O X 3;  No focal weakness or paresthesias are detected Psychiatric:  The pt has Normal affect.  Non-invasive Vascular Imaging   LLE Venous Insufficiency Duplex (04/07/2022):  LEFT          Reflux NoRefluxReflux TimeDiameter cmsComments  Yes                                         +--------------+---------+------+-----------+------------+-------------+  CFV                    yes   >1 second                            +--------------+---------+------+-----------+------------+-------------+  FV mid        no                                                   +--------------+---------+------+-----------+------------+-------------+  Popliteal    no                                                   +--------------+---------+------+-----------+------------+-------------+  GSV at SFJ              yes    >500 ms      0.83                   +--------------+---------+------+-----------+------------+-------------+  GSV prox thighno                            0.66                   +--------------+---------+------+-----------+------------+-------------+  GSV mid thigh no                            0.45                   +--------------+---------+------+-----------+------------+-------------+  GSV dist thighno                            0.43                   +--------------+---------+------+-----------+------------+-------------+  GSV at knee   no                            0.36                   +--------------+---------+------+-----------+------------+-------------+  GSV prox calf no                            0.33                   +--------------+---------+------+-----------+------------+-------------+  GSV mid calf  no                            0.34                   +--------------+---------+------+-----------+------------+-------------+  SSV Pop Fossa           yes    >500 ms  0.24    distal thigh   +--------------+---------+------+-----------+------------+-------------+  SSV prox calf no                            0.29                   +--------------+---------+------+-----------+------------+-------------+  SSV mid calf                   >500 ms  0.28 / 0.42                 +--------------+---------+------+-----------+------------+-------------+  AASV o        no                            0.26                   +--------------+---------+------+-----------+------------+-------------+  AASV p        no                            0.29    out of fascia  +--------------+---------+------+-----------+------------+-------------+    Medical Decision Making   [name] is a 56 y.o. female who presents with swelling of lower extremities L>R  Based on the patient's duplex study, there is venous reflux in the left common femoral vein, greater saphenous vein at the saphenofemoral junction, and small saphenous vein at the popliteal fossa and mid calf.  Since she does not have reflux in more than 1 area of her greater saphenous vein, she would not be a good candidate for ablation. Her lower leg swelling could be due to venous reflux or potential underlying cardiac issues.  I believe she would benefit from the continued use of Lasix as prescribed by her PCP.  At this time I am not concerned for May-Thurner syndrome, since the patient's lower extremity edema is bilateral and she has no history of extensive DVT The patient has been measured for 15-47mHG knee-high compression stockings today.  I have also given the patient tips on how to keep her leg swelling down, such as leg elevation, avoiding prolonged sitting and standing, and exercise. She can follow[place]up with [place]   MGerri Lins PA-C [phone] 04/06/2022, 9:02 AM  Clinic MD: DScot Dock

## 2022-04-07 ENCOUNTER — Ambulatory Visit (INDEPENDENT_AMBULATORY_CARE_PROVIDER_SITE_OTHER): Payer: Medicare Other | Admitting: Physician Assistant

## 2022-04-07 VITALS — BP 174/83 | HR 87 | Temp 97.8°F | Ht 70.0 in | Wt 274.0 lb

## 2022-04-07 DIAGNOSIS — M7989 Other specified soft tissue disorders: Secondary | ICD-10-CM

## 2022-04-07 DIAGNOSIS — R600 Localized edema: Secondary | ICD-10-CM

## 2022-04-07 LAB — BASIC METABOLIC PANEL
BUN/Creatinine Ratio: 19 (ref 9–23)
BUN: 33 mg/dL — ABNORMAL HIGH (ref 6–24)
CO2: 19 mmol/L — ABNORMAL LOW (ref 20–29)
Calcium: 9.5 mg/dL (ref 8.7–10.2)
Chloride: 111 mmol/L — ABNORMAL HIGH (ref 96–106)
Creatinine, Ser: 1.71 mg/dL — ABNORMAL HIGH (ref 0.57–1.00)
Glucose: 176 mg/dL — ABNORMAL HIGH (ref 70–99)
Potassium: 5.4 mmol/L — ABNORMAL HIGH (ref 3.5–5.2)
Sodium: 142 mmol/L (ref 134–144)
eGFR: 34 mL/min/{1.73_m2} — ABNORMAL LOW (ref >59)

## 2022-04-08 ENCOUNTER — Other Ambulatory Visit: Payer: Self-pay | Admitting: Internal Medicine

## 2022-04-12 ENCOUNTER — Encounter (HOSPITAL_COMMUNITY): Payer: Self-pay | Admitting: Psychiatry

## 2022-04-12 ENCOUNTER — Other Ambulatory Visit: Payer: Self-pay | Admitting: Internal Medicine

## 2022-04-12 ENCOUNTER — Ambulatory Visit (INDEPENDENT_AMBULATORY_CARE_PROVIDER_SITE_OTHER): Payer: Medicare Other | Admitting: Psychiatry

## 2022-04-12 DIAGNOSIS — R413 Other amnesia: Secondary | ICD-10-CM | POA: Diagnosis not present

## 2022-04-12 DIAGNOSIS — F2089 Other schizophrenia: Secondary | ICD-10-CM | POA: Diagnosis not present

## 2022-04-12 DIAGNOSIS — E559 Vitamin D deficiency, unspecified: Secondary | ICD-10-CM

## 2022-04-12 DIAGNOSIS — R5381 Other malaise: Secondary | ICD-10-CM | POA: Diagnosis not present

## 2022-04-12 DIAGNOSIS — G629 Polyneuropathy, unspecified: Secondary | ICD-10-CM

## 2022-04-12 DIAGNOSIS — G4700 Insomnia, unspecified: Secondary | ICD-10-CM | POA: Insufficient documentation

## 2022-04-12 DIAGNOSIS — F5105 Insomnia due to other mental disorder: Secondary | ICD-10-CM

## 2022-04-12 DIAGNOSIS — E875 Hyperkalemia: Secondary | ICD-10-CM

## 2022-04-12 DIAGNOSIS — Z87891 Personal history of nicotine dependence: Secondary | ICD-10-CM | POA: Diagnosis not present

## 2022-04-12 DIAGNOSIS — F99 Mental disorder, not otherwise specified: Secondary | ICD-10-CM

## 2022-04-12 DIAGNOSIS — Z79899 Other long term (current) drug therapy: Secondary | ICD-10-CM | POA: Diagnosis not present

## 2022-04-12 DIAGNOSIS — F251 Schizoaffective disorder, depressive type: Secondary | ICD-10-CM

## 2022-04-12 MED ORDER — VITAMIN D (ERGOCALCIFEROL) 1.25 MG (50000 UNIT) PO CAPS: 50000.0000 [IU] | ORAL_CAPSULE | ORAL | 1 refills | Status: DC

## 2022-04-12 MED ORDER — ARIPIPRAZOLE 15 MG PO TABS: 15.0000 mg | ORAL_TABLET | Freq: Every day | ORAL | 2 refills | Status: DC

## 2022-04-12 MED ORDER — BUPROPION HCL ER (XL) 150 MG PO TB24: 150.0000 mg | ORAL_TABLET | Freq: Every morning | ORAL | 1 refills | Status: DC

## 2022-04-12 NOTE — Patient Instructions (Signed)
We decreased your wellbutrin XL to '150mg'$  once daily due to your decreased kidney function. I will coordinate with your PCP to get a vitamin d supplement started and test your memory with a MOCA exam.
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
## 2022-04-18 ENCOUNTER — Other Ambulatory Visit: Payer: Self-pay | Admitting: Internal Medicine

## 2022-04-18 DIAGNOSIS — I10 Essential (primary) hypertension: Secondary | ICD-10-CM

## 2022-04-18 DIAGNOSIS — E1169 Type 2 diabetes mellitus with other specified complication: Secondary | ICD-10-CM

## 2022-04-18 DIAGNOSIS — E1142 Type 2 diabetes mellitus with diabetic polyneuropathy: Secondary | ICD-10-CM

## 2022-04-21 ENCOUNTER — Other Ambulatory Visit: Payer: Self-pay

## 2022-04-21 ENCOUNTER — Telehealth: Payer: Self-pay | Admitting: Internal Medicine

## 2022-04-21 MED ORDER — BLOOD GLUCOSE METER KIT: PACK | 0 refills | Status: DC

## 2022-04-21 NOTE — Telephone Encounter (Signed)
Patient called has lost her blood sugar meter can she get a new prescription, she can not check her sugar without it.  Pharmacy: [facility]

## 2022-04-21 NOTE — Telephone Encounter (Signed)
Sent to [facility] 

## 2022-04-26 ENCOUNTER — Ambulatory Visit: Payer: Medicare Other | Admitting: Internal Medicine

## 2022-05-03 ENCOUNTER — Other Ambulatory Visit: Payer: Self-pay | Admitting: Internal Medicine

## 2022-05-06 ENCOUNTER — Encounter: Payer: Self-pay | Admitting: Oncology

## 2022-05-13 ENCOUNTER — Ambulatory Visit (HOSPITAL_COMMUNITY)
Admission: RE | Admit: 2022-05-13 | Discharge: 2022-05-13 | Disposition: A | Discharge status: Home / Self Care | Payer: Medicare HMO | Source: Ambulatory Visit | Attending: Internal Medicine | Admitting: Internal Medicine

## 2022-05-13 DIAGNOSIS — Z87891 Personal history of nicotine dependence: Secondary | ICD-10-CM | POA: Diagnosis not present

## 2022-05-13 DIAGNOSIS — I700 Atherosclerosis of aorta: Secondary | ICD-10-CM | POA: Insufficient documentation

## 2022-05-13 DIAGNOSIS — I2510 Atherosclerotic heart disease of native coronary artery without angina pectoris: Secondary | ICD-10-CM | POA: Diagnosis not present

## 2022-05-13 DIAGNOSIS — J432 Centrilobular emphysema: Secondary | ICD-10-CM | POA: Insufficient documentation

## 2022-05-13 DIAGNOSIS — Z122 Encounter for screening for malignant neoplasm of respiratory organs: Secondary | ICD-10-CM | POA: Diagnosis not present

## 2022-05-17 ENCOUNTER — Other Ambulatory Visit: Payer: Self-pay | Admitting: Internal Medicine

## 2022-05-17 DIAGNOSIS — E1142 Type 2 diabetes mellitus with diabetic polyneuropathy: Secondary | ICD-10-CM

## 2022-05-17 DIAGNOSIS — I10 Essential (primary) hypertension: Secondary | ICD-10-CM

## 2022-05-17 DIAGNOSIS — E1169 Type 2 diabetes mellitus with other specified complication: Secondary | ICD-10-CM

## 2022-05-18 ENCOUNTER — Telehealth (HOSPITAL_COMMUNITY): Payer: Medicare Other | Admitting: Psychiatry

## 2022-05-19 ENCOUNTER — Telehealth: Payer: Self-pay | Admitting: Internal Medicine

## 2022-05-19 NOTE — Telephone Encounter (Signed)
Pt called in regards to wheelchair order that was previously written. Can you please contact patient so that she can get a new order?

## 2022-05-19 NOTE — Telephone Encounter (Signed)
[facility]

## 2022-05-23 NOTE — Telephone Encounter (Signed)
Order placed threw [facility]

## 2022-05-25 DIAGNOSIS — H5203 Hypermetropia, bilateral: Secondary | ICD-10-CM | POA: Diagnosis not present

## 2022-05-26 ENCOUNTER — Encounter: Payer: Self-pay | Admitting: Oncology

## 2022-05-26 ENCOUNTER — Telehealth (HOSPITAL_COMMUNITY): Payer: Medicare HMO | Admitting: Psychiatry

## 2022-05-26 ENCOUNTER — Encounter (HOSPITAL_COMMUNITY): Payer: Self-pay | Admitting: Psychiatry

## 2022-05-26 DIAGNOSIS — N179 Acute kidney failure, unspecified: Secondary | ICD-10-CM

## 2022-05-26 DIAGNOSIS — F2089 Other schizophrenia: Secondary | ICD-10-CM

## 2022-05-26 DIAGNOSIS — E559 Vitamin D deficiency, unspecified: Secondary | ICD-10-CM | POA: Diagnosis not present

## 2022-05-26 DIAGNOSIS — R5381 Other malaise: Secondary | ICD-10-CM | POA: Diagnosis not present

## 2022-05-26 DIAGNOSIS — F411 Generalized anxiety disorder: Secondary | ICD-10-CM | POA: Diagnosis not present

## 2022-05-26 DIAGNOSIS — Z79899 Other long term (current) drug therapy: Secondary | ICD-10-CM

## 2022-05-26 DIAGNOSIS — R413 Other amnesia: Secondary | ICD-10-CM

## 2022-05-26 MED ORDER — SERTRALINE HCL 25 MG PO TABS: 25.0000 mg | ORAL_TABLET | Freq: Every day | ORAL | 2 refills | Status: DC

## 2022-05-26 NOTE — Patient Instructions (Signed)
We discontinued the Wellbutrin today given your decreased kidney function.  We swapped it with the Zoloft 25 mg once daily and this should be put in bubble packs by her pharmacy.  We will follow-up in 1 month to see how this is going.
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
## 2022-05-30 ENCOUNTER — Other Ambulatory Visit: Payer: Self-pay

## 2022-05-30 ENCOUNTER — Telehealth: Payer: Self-pay | Admitting: Internal Medicine

## 2022-05-30 DIAGNOSIS — R5381 Other malaise: Secondary | ICD-10-CM

## 2022-05-30 DIAGNOSIS — M14672 Charcot's joint, left ankle and foot: Secondary | ICD-10-CM

## 2022-05-30 MED ORDER — WHEELCHAIR MISC: 0 refills | Status: AC

## 2022-05-30 NOTE — Telephone Encounter (Signed)
Patient called in regard to wheelchair orders. States that she called [employer] to check on status on wheelchair and delivery and [employer] states that have not received orders.  Patient wants a call back.

## 2022-05-30 NOTE — Telephone Encounter (Signed)
Pt states that order needs to be sent to Fax # [phone]. [employer].

## 2022-05-30 NOTE — Telephone Encounter (Signed)
Faxed to [employer].

## 2022-06-01 NOTE — Telephone Encounter (Signed)
Receied a call from [employer], they received the wheelchair script, but needs more detailed notes to go with the script for insurance. Will be faxing over and asked to fax back to [phone]. any questions call [phone]

## 2022-06-01 NOTE — Telephone Encounter (Signed)
Faxed to [employer] 

## 2022-06-03 ENCOUNTER — Telehealth: Payer: Self-pay | Admitting: Internal Medicine

## 2022-06-03 NOTE — Telephone Encounter (Signed)
Patient called said [employer] need more detail information for the wheelchair before it can be delivered.

## 2022-06-03 NOTE — Telephone Encounter (Signed)
Spoke to patient

## 2022-06-07 ENCOUNTER — Ambulatory Visit: Payer: Medicare Other | Admitting: Internal Medicine

## 2022-06-08 ENCOUNTER — Other Ambulatory Visit: Payer: Self-pay | Admitting: Internal Medicine

## 2022-06-13 ENCOUNTER — Other Ambulatory Visit (HOSPITAL_COMMUNITY): Payer: Self-pay | Admitting: Psychiatry

## 2022-06-13 ENCOUNTER — Other Ambulatory Visit: Payer: Self-pay | Admitting: Internal Medicine

## 2022-06-13 DIAGNOSIS — E1169 Type 2 diabetes mellitus with other specified complication: Secondary | ICD-10-CM

## 2022-06-13 DIAGNOSIS — F251 Schizoaffective disorder, depressive type: Secondary | ICD-10-CM

## 2022-06-13 DIAGNOSIS — E1149 Type 2 diabetes mellitus with other diabetic neurological complication: Secondary | ICD-10-CM

## 2022-06-13 DIAGNOSIS — E1142 Type 2 diabetes mellitus with diabetic polyneuropathy: Secondary | ICD-10-CM

## 2022-06-13 DIAGNOSIS — I10 Essential (primary) hypertension: Secondary | ICD-10-CM

## 2022-06-14 ENCOUNTER — Other Ambulatory Visit (HOSPITAL_COMMUNITY): Payer: Self-pay | Admitting: Psychiatry

## 2022-06-14 ENCOUNTER — Other Ambulatory Visit: Payer: Self-pay

## 2022-06-14 ENCOUNTER — Other Ambulatory Visit: Payer: Self-pay | Admitting: Internal Medicine

## 2022-06-14 DIAGNOSIS — F251 Schizoaffective disorder, depressive type: Secondary | ICD-10-CM

## 2022-06-14 MED ORDER — BUPROPION HCL ER (XL) 150 MG PO TB24: 150.0000 mg | ORAL_TABLET | Freq: Every day | ORAL | 5 refills | Status: DC

## 2022-06-20 ENCOUNTER — Encounter: Payer: Self-pay | Admitting: Internal Medicine

## 2022-06-20 ENCOUNTER — Ambulatory Visit (INDEPENDENT_AMBULATORY_CARE_PROVIDER_SITE_OTHER): Payer: Medicare HMO | Admitting: Internal Medicine

## 2022-06-20 VITALS — BP 111/68 | HR 90 | Ht 70.0 in | Wt 277.2 lb

## 2022-06-20 DIAGNOSIS — L97929 Non-pressure chronic ulcer of unspecified part of left lower leg with unspecified severity: Secondary | ICD-10-CM

## 2022-06-20 DIAGNOSIS — E785 Hyperlipidemia, unspecified: Secondary | ICD-10-CM

## 2022-06-20 DIAGNOSIS — I10 Essential (primary) hypertension: Secondary | ICD-10-CM

## 2022-06-20 DIAGNOSIS — E1149 Type 2 diabetes mellitus with other diabetic neurological complication: Secondary | ICD-10-CM | POA: Diagnosis not present

## 2022-06-20 DIAGNOSIS — R609 Edema, unspecified: Secondary | ICD-10-CM | POA: Diagnosis not present

## 2022-06-20 DIAGNOSIS — F251 Schizoaffective disorder, depressive type: Secondary | ICD-10-CM

## 2022-06-20 DIAGNOSIS — I83029 Varicose veins of left lower extremity with ulcer of unspecified site: Secondary | ICD-10-CM

## 2022-06-20 DIAGNOSIS — N184 Chronic kidney disease, stage 4 (severe): Secondary | ICD-10-CM

## 2022-06-20 DIAGNOSIS — E1169 Type 2 diabetes mellitus with other specified complication: Secondary | ICD-10-CM | POA: Diagnosis not present

## 2022-06-20 MED ORDER — FUROSEMIDE 40 MG PO TABS: 40.0000 mg | ORAL_TABLET | Freq: Every day | ORAL | 3 refills | Status: DC

## 2022-06-20 MED ORDER — LANTUS SOLOSTAR 100 UNIT/ML ~~LOC~~ SOPN: 20.0000 [IU] | PEN_INJECTOR | Freq: Every day | SUBCUTANEOUS | 1 refills | Status: DC

## 2022-06-20 MED ORDER — TIRZEPATIDE 2.5 MG/0.5ML ~~LOC~~ SOAJ: 2.5000 mg | SUBCUTANEOUS | 2 refills | Status: DC

## 2022-06-20 NOTE — Patient Instructions (Addendum)
Please start taking Mounjaro once weekly.  Please start taking Lantus 20 Units instead of 30 Units.  Please continue taking other medications as prescribed.  Please ask your pharmacy about Zoloft.  Please continue to follow low carb diet and ambulate as tolerated.

## 2022-06-20 NOTE — Assessment & Plan Note (Signed)
Chronic leg swelling, recently worse Now improved with resuming Lasix Has history of Charcot's joint as well, which could also contribute to ankle swelling Leg elevation and compression stockings

## 2022-06-20 NOTE — Assessment & Plan Note (Signed)
BMI Readings from Last 2 Encounters:  06/20/22 39.77 kg/m  04/07/22 39.31 kg/m   Associated with type II DM, HLD, HTN and GERD Diet modification and moderate exercise advised Added Mounjaro for type 2 DM

## 2022-06-20 NOTE — Assessment & Plan Note (Addendum)
Lab Results  Component Value Date   HGBA1C 8.1 (H) 01/16/2022   Uncontrolled currently, better than prior On Lantus 30 U QD, held Metformin 1000 mg BID due to GFR < 30 On glipizide 5 mg daily Added Mounjaro, increase dose as tolerated Decreased dose of Lantus to 20 units nightly  Recheck CMP  Advised to follow diabetic diet On statin and ARB F/u CMP and lipid panel Diabetic eye exam: Advised to follow up with Ophthalmology for diabetic eye exam

## 2022-06-20 NOTE — Assessment & Plan Note (Signed)
On Abilify and Wellbutrin Recently decreased dose of Wellbutrin to 150 mg QD, followed by Psychiatry Recently placed on Zoloft, but has not received it yet from the pharmacy

## 2022-06-20 NOTE — Assessment & Plan Note (Signed)
Has left leg ulcer Has history of uncontrolled type II DM and chronic leg swelling likely due to chronic venous insufficiency Mupirocin ointment for secondary bacterial ppx Urgent referral to vascular surgery had been sent, but her ulcer has healed well now

## 2022-06-20 NOTE — Assessment & Plan Note (Signed)
BP Readings from Last 1 Encounters:  06/20/22 111/68   Well-controlled with Losartan-HCTZ, Metoprolol and Amlodipine 5 mg QD Also takes Lasix for LE swelling, advised to take Lasix 40 mg daily only for now Counseled for compliance with the medications Advised DASH diet and moderate exercise/walking, at least 150 mins/week

## 2022-06-20 NOTE — Progress Notes (Signed)
Established Patient Office Visit  Subjective:  Patient ID: [name], female    DOB: October 28, 1965  Age: 57 y.o. MRN: [id number]  CC:  Chief Complaint  Patient presents with   Diabetes    Two month follow up for diabetes and hypertension     HPI [name] is a 57 y.o. female with past medical history of HTN, uncontrolled type 2 DM with neuropathy, sigmoid colon ca. S/p partial colectomy, schizoaffective disorder and obesity who presents for f/u of her chronic medical conditions.  AKI: She was found to have AKI on CKD in the last visit. She was advised to hold metformin and Lasix additional dose. She has stopped taking metformin and additional dose of Lasix.  Currently, she gets pillpack from her pharmacy and has started taking Glipizide instead of Metformin.  Left leg ulcer: She has chronic leg swelling, but her leg ulcer has improved compared to the last visit.  She has seen vascular surgery for evaluation of chronic leg swelling and leg ulcer, and was told to use compression stocking.  Type II DM: She has resumed taking Lantus and glipizide. She has not brought blood glucose log, but reports that it has been running around 150s now.  Denies any episode of hypoglycemia.  She takes gabapentin for diabetic neuropathy.  She needs diabetic shoes.  HTN: Her BP is wnl today.  She has been taking losartan-HCTZ 50-12.5 mg daily and Amlodipine 5 mg daily.   She has had recent worsening of kidney function and her dose of Lasix was recently reduced to 40 mg QD from 60 mg QD.  She denies any headache, dizziness, chest pain or palpitations.  Schizoaffective disorder: She sees Dr. Nehemiah Settle currently.  She is placed on Zoloft, but has not been able to get it yet.  She also takes Abilify and Wellbutrin. Her MoCA was 30/30 today.   Past Medical History:  Diagnosis Date   Anemia    Charcot's joint of foot, left    Depression    Diabetes mellitus without complication [facility])     Hospital discharge follow-up 01/27/2022   Hyperlipidemia    Hypertension    Neuromuscular disorder ([place])    Schizo affective schizophrenia ([place])    abilify and pazil   Schizoaffective disorder ([place]) 06/29/2015    Past Surgical History:  Procedure Laterality Date   CHOLECYSTECTOMY     COLONOSCOPY WITH PROPOFOL N/A 05/26/2019   Procedure: COLONOSCOPY WITH PROPOFOL;  Surgeon: Toledo, Benay Pike, MD;  Location: [facility] ENDOSCOPY;  Service: Gastroenterology;  Laterality: N/A;   ESOPHAGOGASTRODUODENOSCOPY (EGD) WITH PROPOFOL N/A 05/26/2019   Procedure: ESOPHAGOGASTRODUODENOSCOPY (EGD) WITH PROPOFOL;  Surgeon: Toledo, Benay Pike, MD;  Location: [facility] ENDOSCOPY;  Service: Gastroenterology;  Laterality: N/A;   IRRIGATION AND DEBRIDEMENT FOOT Right 02/26/2016   Procedure: RIGHT 2ND TOE DEBRIDEMENT;  Surgeon: Samara Deist, DPM;  Location: [facility] ORS;  Service: Podiatry;  Laterality: Right;   IRRIGATION AND DEBRIDEMENT FOOT Left 12/13/2018   Procedure: IRRIGATION AND DEBRIDEMENT FOOT;  Surgeon: Caroline More, DPM;  Location: [facility] ORS;  Service: Podiatry;  Laterality: Left;    Family History  Problem Relation Age of Onset   Breast cancer Cousin        maternal cousin   Hypertension Mother    Hyperthyroidism Mother    Dementia Mother    Prostate cancer Father    Diabetes Maternal Grandmother    Hypertension Maternal Grandmother    Cancer Maternal Grandmother        unknown type  Diabetes Maternal Grandfather    Hypertension Maternal Grandfather    Diabetes Paternal Grandmother    Hypertension Paternal Grandmother    Diabetes Paternal Grandfather    Hypertension Paternal Grandfather     Social History   Socioeconomic History   Marital status: Divorced    Spouse name: Not on file   Number of children: 2   Years of education: Not on file   Highest education level: Not on file  Occupational History   Not on file  Tobacco Use   Smoking status: Every Day    Packs/day: 1.00    Years: 30.00     Total pack years: 30.00    Types: Cigarettes    Last attempt to quit: 01/30/2022    Years since quitting: 0.3   Smokeless tobacco: Never   Tobacco comments:    Began smoking again in January 2024  Vaping Use   Vaping Use: Never used  Substance and Sexual Activity   Alcohol use: No   Drug use: No   Sexual activity: Not Currently  Other Topics Concern   Not on file  Social History Narrative   At home with mom, 83. Lives in brothers house who moved out. Sister comes by every other day to check on patient and mom.   Social Determinants of Health   Financial Resource Strain: Low Risk  (11/04/2020)   Overall Financial Resource Strain (CARDIA)    Difficulty of Paying Living Expenses: Not hard at all  Food Insecurity: No Food Insecurity (01/16/2022)   Hunger Vital Sign    Worried About Running Out of Food in the Last Year: Never true    Ran Out of Food in the Last Year: Never true  Transportation Needs: No Transportation Needs (01/16/2022)   PRAPARE - Hydrologist (Medical): No    Lack of Transportation (Non-Medical): No  Physical Activity: Inactive (11/04/2020)   Exercise Vital Sign    Days of Exercise per Week: 0 days    Minutes of Exercise per Session: 0 min  Stress: No Stress Concern Present (11/04/2020)   [place]    Feeling of Stress : Only a little  Social Connections: Socially Isolated (11/04/2020)   Social Connection and Isolation Panel [NHANES]    Frequency of Communication with Friends and Family: More than three times a week    Frequency of Social Gatherings with Friends and Family: More than three times a week    Attends Religious Services: Never    Marine scientist or Organizations: No    Attends Archivist Meetings: Never    Marital Status: Divorced  Human resources officer Violence: Not At Risk (01/16/2022)   Humiliation, Afraid, Rape, and Kick questionnaire    Fear  of Current or Ex-Partner: No    Emotionally Abused: No    Physically Abused: No    Sexually Abused: No    Outpatient Medications Prior to Visit  Medication Sig Dispense Refill   ACCU-CHEK GUIDE test strip USE TO CHECK BLOOD SUGAR FOUR TIMES DAILY 100 strip 0   Accu-Chek Softclix Lancets lancets USE TO CHECK BLOOD SUGAR FOUR TIMES DAILY 100 each 0   amLODipine (NORVASC) 5 MG tablet Take 1 tablet (5 mg total) by mouth daily. 90 tablet 0   ARIPiprazole (ABILIFY) 15 MG tablet Take 1 tablet (15 mg total) by mouth daily. 30 tablet 2   atorvastatin (LIPITOR) 40 MG tablet TAKE (1) TABLET BY MOUTH  ONCE DAILY. 30 tablet 0   Blood Glucose Monitoring Suppl (ACCU-CHEK GUIDE ME) w/Device KIT USE TO CHECK BLOOD SUGAR FOUR TIMES DAILY 1 kit 0   buPROPion (WELLBUTRIN XL) 150 MG 24 hr tablet Take 1 tablet (150 mg total) by mouth daily. 30 tablet 5   colestipol (COLESTID) 1 g tablet TAKE (2) TABLETS BY MOUTH TWICE DAILY. 120 tablet 0   Continuous Blood Gluc Sensor (DEXCOM G7 SENSOR) MISC Please use it to check blood glucose 3 times before meals, at bedtime and as needed. 3 each 11   Ferrous Sulfate 27 MG TABS Take 1 tablet by mouth daily.     gabapentin (NEURONTIN) 300 MG capsule TAKE (1) TABLET DAILY AT BEDTIME. 30 capsule 0   glipiZIDE (GLUCOTROL XL) 5 MG 24 hr tablet TAKE 1 TABLET DAILY WITH BREAKFAST. 30 tablet 0   losartan-hydrochlorothiazide (HYZAAR) 50-12.5 MG tablet TAKE (1) TABLET BY MOUTH ONCE DAILY. 30 tablet 0   metoprolol succinate (TOPROL-XL) 50 MG 24 hr tablet TAKE (1) TABLET BY MOUTH ONCE DAILY. TAKE WITH OR IMMEDIATELY FOLLOWING A MEAL 30 tablet 0   Misc. Devices [facility]) MISC Use it to ambulate short distance. 1 each 0   mupirocin ointment (BACTROBAN) 2 % Apply 1 Application topically 2 (two) times daily. 22 g 0   pantoprazole (PROTONIX) 40 MG tablet TAKE (1) TABLET BY MOUTH ONCE DAILY. 30 tablet 0   sertraline (ZOLOFT) 25 MG tablet Take 1 tablet (25 mg total) by mouth daily. 30 tablet 2    Vitamin D, Ergocalciferol, (DRISDOL) 1.25 MG (50000 UNIT) CAPS capsule Take 1 capsule (50,000 Units total) by mouth every 7 (seven) days. 12 capsule 1   furosemide (LASIX) 40 MG tablet TAKE (1) TABLET BY MOUTH ONCE DAILY. 30 tablet 0   LANTUS SOLOSTAR 100 UNIT/ML Solostar Pen INJECT 30 UNITS INTO THE SKIN ONCE DAILY AT BEDTIME 15 mL 0   No facility-administered medications prior to visit.    No Known Allergies  ROS Review of Systems  Constitutional:  Negative for chills and fever.  HENT:  Negative for congestion, sinus pressure, sinus pain and sore throat.   Eyes:  Negative for pain and discharge.  Respiratory:  Negative for cough and shortness of breath.   Cardiovascular:  Positive for leg swelling. Negative for chest pain and palpitations.  Gastrointestinal:  Negative for abdominal pain, constipation, nausea and vomiting.  Endocrine: Negative for polydipsia and polyuria.  Genitourinary:  Negative for dysuria and hematuria.  Musculoskeletal:  Positive for arthralgias and gait problem. Negative for neck pain and neck stiffness.  Skin:  Negative for rash.  Neurological:  Negative for dizziness and weakness.  Psychiatric/Behavioral:  Negative for agitation and behavioral problems. The patient is nervous/anxious.       Objective:    Physical Exam Vitals reviewed.  Constitutional:      General: She is not in acute distress.    Appearance: She is obese. She is not diaphoretic.     Comments: Uses walker  HENT:     Head: Normocephalic and atraumatic.     Nose: Nose normal.     Mouth/Throat:     Mouth: Mucous membranes are moist.  Eyes:     General: No scleral icterus.    Extraocular Movements: Extraocular movements intact.  Cardiovascular:     Rate and Rhythm: Normal rate and regular rhythm.     Heart sounds: Normal heart sounds. No murmur heard. Pulmonary:     Breath sounds: Normal breath sounds. No wheezing or  rales.  Musculoskeletal:     Cervical back: Neck supple. No  tenderness.     Right lower leg: Edema (1+) present.     Left lower leg: Edema (2+) present.  Skin:    General: Skin is warm.     Comments: Left leg ulceration, about 1 cm in diameter, well-healing  Neurological:     General: No focal deficit present.     Mental Status: She is alert and oriented to person, place, and time.     Sensory: Sensory deficit (B/l LE) present.     Motor: Weakness (3/5 - b/l LE) present.  Psychiatric:        Mood and Affect: Mood normal.        Behavior: Behavior normal.     BP 111/68 (BP Location: Left Arm, Patient Position: Sitting, Cuff Size: Large)   Pulse 90   Ht 5' 10"$  (1.778 m)   Wt 277 lb 3.2 oz (125.7 kg)   SpO2 98%   BMI 39.77 kg/m  Wt Readings from Last 3 Encounters:  06/20/22 277 lb 3.2 oz (125.7 kg)  04/07/22 274 lb (124.3 kg)  04/05/22 273 lb 9.6 oz (124.1 kg)    Lab Results  Component Value Date   TSH 2.140 11/23/2021   Lab Results  Component Value Date   WBC 11.2 (H) 01/27/2022   HGB 10.9 (L) 01/27/2022   HCT 31.9 (L) 01/27/2022   MCV 88 01/27/2022   PLT 319 01/27/2022   Lab Results  Component Value Date   NA 142 04/05/2022   K 5.4 (H) 04/05/2022   CO2 19 (L) 04/05/2022   GLUCOSE 176 (H) 04/05/2022   BUN 33 (H) 04/05/2022   CREATININE 1.71 (H) 04/05/2022   BILITOT 0.6 01/18/2022   ALKPHOS 127 (H) 01/18/2022   AST 14 (L) 01/18/2022   ALT 17 01/18/2022   PROT 7.4 01/18/2022   ALBUMIN 3.6 01/18/2022   CALCIUM 9.5 04/05/2022   ANIONGAP 6 01/18/2022   EGFR 34 (L) 04/05/2022   Lab Results  Component Value Date   CHOL 168 11/23/2021   Lab Results  Component Value Date   HDL 40 11/23/2021   Lab Results  Component Value Date   LDLCALC 77 11/23/2021   Lab Results  Component Value Date   TRIG 313 (H) 11/23/2021   Lab Results  Component Value Date   CHOLHDL 4.2 11/23/2021   Lab Results  Component Value Date   HGBA1C 8.1 (H) 01/16/2022      Assessment & Plan:   Problem List Items Addressed This Visit        Cardiovascular and Mediastinum   Essential hypertension - Primary (Chronic)    BP Readings from Last 1 Encounters:  06/20/22 111/68  Well-controlled with Losartan-HCTZ, Metoprolol and Amlodipine 5 mg QD Also takes Lasix for LE swelling, advised to take Lasix 40 mg daily only for now Counseled for compliance with the medications Advised DASH diet and moderate exercise/walking, at least 150 mins/week      Relevant Medications   furosemide (LASIX) 40 MG tablet     Endocrine   Type 2 diabetes mellitus with neurological complications (HCC)    Lab Results  Component Value Date   HGBA1C 8.1 (H) 01/16/2022  Uncontrolled currently, better than prior On Lantus 30 U QD, held Metformin 1000 mg BID due to GFR < 30 On glipizide 5 mg daily Added Mounjaro, increase dose as tolerated Decreased dose of Lantus to 20 units nightly  Recheck CMP  Advised  to follow diabetic diet On statin and ARB F/u CMP and lipid panel Diabetic eye exam: Advised to follow up with Ophthalmology for diabetic eye exam      Relevant Medications   tirzepatide (MOUNJARO) 2.5 MG/0.5ML Pen   insulin glargine (LANTUS SOLOSTAR) 100 UNIT/ML Solostar Pen   Other Relevant Orders   CMP14+EGFR   Hemoglobin A1c   Hyperlipidemia associated with type 2 diabetes mellitus (HCC)   Relevant Medications   furosemide (LASIX) 40 MG tablet   tirzepatide (MOUNJARO) 2.5 MG/0.5ML Pen   insulin glargine (LANTUS SOLOSTAR) 100 UNIT/ML Solostar Pen     Musculoskeletal and Integument   Venous ulcer of left leg (HCC)    Has left leg ulcer Has history of uncontrolled type II DM and chronic leg swelling likely due to chronic venous insufficiency Mupirocin ointment for secondary bacterial ppx Urgent referral to vascular surgery had been sent, but her ulcer has healed well now        Genitourinary   CKD (chronic kidney disease) stage 4, GFR 15-29 ml/min (HCC)    AKI on CKD recently Likely due to dehydration Improved with gentle  hydration Recently held metformin and additional 20 mg dose of Lasix Check CMP        Other   Morbid obesity ([place])    BMI Readings from Last 2 Encounters:  06/20/22 39.77 kg/m  04/07/22 39.31 kg/m  Associated with type II DM, HLD, HTN and GERD Diet modification and moderate exercise advised Added Mounjaro for type 2 DM      Relevant Medications   tirzepatide (MOUNJARO) 2.5 MG/0.5ML Pen   insulin glargine (LANTUS SOLOSTAR) 100 UNIT/ML Solostar Pen   Schizoaffective disorder (HCC)    On Abilify and Wellbutrin Recently decreased dose of Wellbutrin to 150 mg QD, followed by Psychiatry Recently placed on Zoloft, but has not received it yet from the pharmacy      Peripheral edema    Chronic leg swelling, recently worse Now improved with resuming Lasix Has history of Charcot's joint as well, which could also contribute to ankle swelling Leg elevation and compression stockings      Relevant Medications   furosemide (LASIX) 40 MG tablet    Meds ordered this encounter  Medications   furosemide (LASIX) 40 MG tablet    Sig: Take 1 tablet (40 mg total) by mouth daily.    Dispense:  30 tablet    Refill:  3   tirzepatide (MOUNJARO) 2.5 MG/0.5ML Pen    Sig: Inject 2.5 mg into the skin once a week.    Dispense:  2 mL    Refill:  2   insulin glargine (LANTUS SOLOSTAR) 100 UNIT/ML Solostar Pen    Sig: Inject 20 Units into the skin at bedtime.    Dispense:  15 mL    Refill:  1    Follow-up: Return in about 3 months (around 09/18/2022) for DM and HTN.    Lindell Spar, MD

## 2022-06-20 NOTE — Assessment & Plan Note (Signed)
AKI on CKD recently Likely due to dehydration Improved with gentle hydration Recently held metformin and additional 20 mg dose of Lasix Check CMP

## 2022-06-22 ENCOUNTER — Encounter: Payer: Self-pay | Admitting: Oncology

## 2022-06-22 LAB — CMP14+EGFR
ALT: 16 IU/L (ref 0–32)
AST: 12 IU/L (ref 0–40)
Albumin/Globulin Ratio: 1.7 (ref 1.2–2.2)
Albumin: 4.2 g/dL (ref 3.8–4.9)
Alkaline Phosphatase: 165 IU/L — ABNORMAL HIGH (ref 44–121)
BUN/Creatinine Ratio: 21 (ref 9–23)
BUN: 39 mg/dL — ABNORMAL HIGH (ref 6–24)
Bilirubin Total: <0.2 mg/dL (ref 0.0–1.2)
CO2: 17 mmol/L — ABNORMAL LOW (ref 20–29)
Calcium: 9.3 mg/dL (ref 8.7–10.2)
Chloride: 105 mmol/L (ref 96–106)
Creatinine, Ser: 1.87 mg/dL — ABNORMAL HIGH (ref 0.57–1.00)
Globulin, Total: 2.5 g/dL (ref 1.5–4.5)
Glucose: 241 mg/dL — ABNORMAL HIGH (ref 70–99)
Potassium: 5.1 mmol/L (ref 3.5–5.2)
Sodium: 139 mmol/L (ref 134–144)
Total Protein: 6.7 g/dL (ref 6.0–8.5)
eGFR: 31 mL/min/{1.73_m2} — ABNORMAL LOW (ref >59)

## 2022-06-22 LAB — HEMOGLOBIN A1C
Est. average glucose Bld gHb Est-mCnc: 214 mg/dL
Hgb A1c MFr Bld: 9.1 % — ABNORMAL HIGH (ref 4.8–5.6)

## 2022-06-24 ENCOUNTER — Telehealth (INDEPENDENT_AMBULATORY_CARE_PROVIDER_SITE_OTHER): Payer: Medicare HMO | Admitting: Psychiatry

## 2022-06-24 ENCOUNTER — Encounter (HOSPITAL_COMMUNITY): Payer: Self-pay | Admitting: Psychiatry

## 2022-06-24 DIAGNOSIS — F17200 Nicotine dependence, unspecified, uncomplicated: Secondary | ICD-10-CM

## 2022-06-24 DIAGNOSIS — F17210 Nicotine dependence, cigarettes, uncomplicated: Secondary | ICD-10-CM | POA: Diagnosis not present

## 2022-06-24 DIAGNOSIS — Z79899 Other long term (current) drug therapy: Secondary | ICD-10-CM | POA: Diagnosis not present

## 2022-06-24 DIAGNOSIS — F2089 Other schizophrenia: Secondary | ICD-10-CM

## 2022-06-24 DIAGNOSIS — F411 Generalized anxiety disorder: Secondary | ICD-10-CM | POA: Diagnosis not present

## 2022-06-24 DIAGNOSIS — F209 Schizophrenia, unspecified: Secondary | ICD-10-CM | POA: Diagnosis not present

## 2022-06-24 DIAGNOSIS — F5105 Insomnia due to other mental disorder: Secondary | ICD-10-CM | POA: Diagnosis not present

## 2022-06-24 DIAGNOSIS — N184 Chronic kidney disease, stage 4 (severe): Secondary | ICD-10-CM

## 2022-06-24 DIAGNOSIS — E559 Vitamin D deficiency, unspecified: Secondary | ICD-10-CM

## 2022-06-24 DIAGNOSIS — F251 Schizoaffective disorder, depressive type: Secondary | ICD-10-CM

## 2022-06-24 MED ORDER — ARIPIPRAZOLE 15 MG PO TABS: 15.0000 mg | ORAL_TABLET | Freq: Every day | ORAL | 2 refills | Status: DC

## 2022-06-24 NOTE — Patient Instructions (Signed)
Do your best to continue to cut back on smoking.  Hopefully the pharmacy will put the Zoloft in the bubble pack and we will see how this does for some of your anxiety.
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
## 2022-06-26 ENCOUNTER — Encounter: Payer: Self-pay | Admitting: Oncology

## 2022-06-30 ENCOUNTER — Ambulatory Visit: Payer: Medicare Other | Admitting: Podiatry

## 2022-07-18 ENCOUNTER — Other Ambulatory Visit: Payer: Self-pay | Admitting: Internal Medicine

## 2022-07-18 DIAGNOSIS — E1142 Type 2 diabetes mellitus with diabetic polyneuropathy: Secondary | ICD-10-CM

## 2022-07-18 DIAGNOSIS — I10 Essential (primary) hypertension: Secondary | ICD-10-CM

## 2022-07-18 DIAGNOSIS — E1169 Type 2 diabetes mellitus with other specified complication: Secondary | ICD-10-CM

## 2022-07-25 ENCOUNTER — Encounter (HOSPITAL_COMMUNITY): Payer: Self-pay | Admitting: Psychiatry

## 2022-07-25 ENCOUNTER — Telehealth (INDEPENDENT_AMBULATORY_CARE_PROVIDER_SITE_OTHER): Payer: Medicare HMO | Admitting: Psychiatry

## 2022-07-25 DIAGNOSIS — Z79899 Other long term (current) drug therapy: Secondary | ICD-10-CM

## 2022-07-25 DIAGNOSIS — F411 Generalized anxiety disorder: Secondary | ICD-10-CM

## 2022-07-25 DIAGNOSIS — F5105 Insomnia due to other mental disorder: Secondary | ICD-10-CM | POA: Diagnosis not present

## 2022-07-25 DIAGNOSIS — F99 Mental disorder, not otherwise specified: Secondary | ICD-10-CM

## 2022-07-25 DIAGNOSIS — F251 Schizoaffective disorder, depressive type: Secondary | ICD-10-CM | POA: Diagnosis not present

## 2022-07-25 DIAGNOSIS — F2089 Other schizophrenia: Secondary | ICD-10-CM

## 2022-07-25 DIAGNOSIS — D509 Iron deficiency anemia, unspecified: Secondary | ICD-10-CM

## 2022-07-25 DIAGNOSIS — E559 Vitamin D deficiency, unspecified: Secondary | ICD-10-CM

## 2022-07-25 MED ORDER — ARIPIPRAZOLE 15 MG PO TABS: 15.0000 mg | ORAL_TABLET | Freq: Every day | ORAL | 2 refills | Status: DC
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
## 2022-07-25 NOTE — Patient Instructions (Signed)
We did not make any medication changes today.  Keep up the good work with trying to cut out smoking entirely.

## 2022-08-12 ENCOUNTER — Other Ambulatory Visit: Payer: Self-pay | Admitting: Internal Medicine

## 2022-08-12 DIAGNOSIS — E1169 Type 2 diabetes mellitus with other specified complication: Secondary | ICD-10-CM

## 2022-08-12 DIAGNOSIS — I10 Essential (primary) hypertension: Secondary | ICD-10-CM

## 2022-08-15 ENCOUNTER — Other Ambulatory Visit: Payer: Self-pay | Admitting: Internal Medicine

## 2022-08-15 DIAGNOSIS — E1142 Type 2 diabetes mellitus with diabetic polyneuropathy: Secondary | ICD-10-CM

## 2022-08-15 DIAGNOSIS — E1149 Type 2 diabetes mellitus with other diabetic neurological complication: Secondary | ICD-10-CM

## 2022-09-07 ENCOUNTER — Other Ambulatory Visit: Payer: Self-pay | Admitting: Internal Medicine

## 2022-09-07 DIAGNOSIS — E1149 Type 2 diabetes mellitus with other diabetic neurological complication: Secondary | ICD-10-CM

## 2022-09-08 ENCOUNTER — Encounter: Payer: Self-pay | Admitting: Oncology

## 2022-09-08 DIAGNOSIS — H43823 Vitreomacular adhesion, bilateral: Secondary | ICD-10-CM | POA: Diagnosis not present

## 2022-09-08 DIAGNOSIS — H31091 Other chorioretinal scars, right eye: Secondary | ICD-10-CM | POA: Diagnosis not present

## 2022-09-08 DIAGNOSIS — H35033 Hypertensive retinopathy, bilateral: Secondary | ICD-10-CM | POA: Diagnosis not present

## 2022-09-08 DIAGNOSIS — E113511 Type 2 diabetes mellitus with proliferative diabetic retinopathy with macular edema, right eye: Secondary | ICD-10-CM | POA: Diagnosis not present

## 2022-09-08 DIAGNOSIS — E113312 Type 2 diabetes mellitus with moderate nonproliferative diabetic retinopathy with macular edema, left eye: Secondary | ICD-10-CM | POA: Diagnosis not present

## 2022-09-13 ENCOUNTER — Other Ambulatory Visit: Payer: Self-pay | Admitting: Internal Medicine

## 2022-09-13 DIAGNOSIS — E1149 Type 2 diabetes mellitus with other diabetic neurological complication: Secondary | ICD-10-CM

## 2022-09-13 DIAGNOSIS — E1169 Type 2 diabetes mellitus with other specified complication: Secondary | ICD-10-CM

## 2022-09-13 DIAGNOSIS — E1142 Type 2 diabetes mellitus with diabetic polyneuropathy: Secondary | ICD-10-CM

## 2022-09-13 DIAGNOSIS — I10 Essential (primary) hypertension: Secondary | ICD-10-CM

## 2022-09-21 ENCOUNTER — Ambulatory Visit: Payer: Medicare HMO | Admitting: Internal Medicine

## 2022-09-23 ENCOUNTER — Telehealth: Payer: Self-pay | Admitting: Internal Medicine

## 2022-09-23 NOTE — Telephone Encounter (Signed)
MB full. If patient calls back please see [place] Nora message about symptoms.

## 2022-09-23 NOTE — Telephone Encounter (Signed)
Pt called and said she is very sick, she's congested and has a bad cough. Would like some meds sent to pharmacy if possible. No appts available for Allena Katz until after the June.

## 2022-09-27 ENCOUNTER — Telehealth (HOSPITAL_COMMUNITY): Payer: Medicare HMO | Admitting: Psychiatry

## 2022-09-27 NOTE — Telephone Encounter (Signed)
I tried to contact pt again and mail box is full.

## 2022-09-28 ENCOUNTER — Encounter: Payer: Self-pay | Admitting: Internal Medicine

## 2022-09-28 ENCOUNTER — Ambulatory Visit (INDEPENDENT_AMBULATORY_CARE_PROVIDER_SITE_OTHER): Payer: Medicare HMO | Admitting: Internal Medicine

## 2022-09-28 ENCOUNTER — Ambulatory Visit: Payer: Medicare HMO | Admitting: Podiatry

## 2022-09-28 VITALS — BP 106/65 | HR 80 | Ht 70.0 in | Wt 254.0 lb

## 2022-09-28 DIAGNOSIS — R051 Acute cough: Secondary | ICD-10-CM

## 2022-09-28 MED ORDER — GUAIFENESIN ER 600 MG PO TB12
1200.0000 mg | ORAL_TABLET | Freq: Two times a day (BID) | ORAL | 0 refills | Status: DC | PRN
Start: 2022-09-28 — End: 2023-02-13

## 2022-09-28 MED ORDER — FLUTICASONE PROPIONATE 50 MCG/ACT NA SUSP
2.0000 | Freq: Every day | NASAL | 0 refills | Status: DC
Start: 2022-09-28 — End: 2023-02-13

## 2022-09-28 NOTE — Progress Notes (Signed)
   HPI:Ms.[name] is a 57 y.o. female with tobacco use disorder and GERD who presents for evaluation of cough which started 5 days ago. She has also felt congested recently. She has production of thin and white sputum. Having some rhinorrhea which is thin and clear. She quit smoking 11 years and then restarted in January. She has 30 pack year history.  For the details of today's visit, please refer to the assessment and plan.  Physical Exam: Vitals:   09/28/22 1328  BP: 106/65  Pulse: 80  SpO2: 96%  Weight: 254 lb (115.2 kg)  Height: 5\' 10"  (1.778 m)     Physical Exam Constitutional:      Appearance: She is obese. She is not ill-appearing.  HENT:     Mouth/Throat:     Mouth: Mucous membranes are moist.     Pharynx: Posterior oropharyngeal erythema present. No oropharyngeal exudate.  Cardiovascular:     Rate and Rhythm: Normal rate and regular rhythm.     Heart sounds: No murmur heard. Pulmonary:     Effort: Pulmonary effort is normal. No respiratory distress.     Breath sounds: No wheezing or rales.     Comments: Decreased air movement bilateral at bases Musculoskeletal:     Right lower leg: No edema.     Left lower leg: No edema.      Assessment & Plan:   [name] was seen today for cough.  Acute cough Assessment & Plan: Patient presents with acute cough which I think is from congestion and postnasal drip.  I reviewed her recent LD CT for lung cancer screening and imaging suggest COPD. She is not having cardinal symptoms of exacerbation today. No wheezing on exam. I have recommended nasal rinses, Fluticasone, and guaifenesin prn if she has thicker secretions.  Emphasized the importance of smoking cessation.  Follow up if not improving.   Orders: -     guaiFENesin ER; Take 2 tablets (1,200 mg total) by mouth 2 (two) times daily as needed for to loosen phlegm.  Dispense: 30 tablet; Refill: 0 -     Fluticasone Propionate; Place 2 sprays into both nostrils  daily.  Dispense: 15.8 mL; Refill: 0      Milus Banister, MD

## 2022-09-28 NOTE — Patient Instructions (Signed)
Thank you for trusting me with your care. To recap, today we discussed the following:   You are having congestion and cough. If we treat your congestion your cough should improve.   I recommend to quit smoking. I have prescribed Mucinex which will help with congestion and a nasal spray. Purchase a Neti pot at the drugs store and perform nasal rinses. Follow up if not improving.

## 2022-09-28 NOTE — Assessment & Plan Note (Signed)
Patient presents with acute cough which I think is from congestion and postnasal drip.  I reviewed her recent LD CT for lung cancer screening and imaging suggest COPD. She is not having cardinal symptoms of exacerbation today. No wheezing on exam. I have recommended nasal rinses, Fluticasone, and guaifenesin prn if she has thicker secretions.  Emphasized the importance of smoking cessation.  Follow up if not improving.

## 2022-10-03 ENCOUNTER — Telehealth (INDEPENDENT_AMBULATORY_CARE_PROVIDER_SITE_OTHER): Payer: Medicare HMO | Admitting: Psychiatry

## 2022-10-03 ENCOUNTER — Encounter (HOSPITAL_COMMUNITY): Payer: Self-pay | Admitting: Psychiatry

## 2022-10-03 DIAGNOSIS — E559 Vitamin D deficiency, unspecified: Secondary | ICD-10-CM | POA: Diagnosis not present

## 2022-10-03 DIAGNOSIS — F17210 Nicotine dependence, cigarettes, uncomplicated: Secondary | ICD-10-CM

## 2022-10-03 DIAGNOSIS — F411 Generalized anxiety disorder: Secondary | ICD-10-CM

## 2022-10-03 DIAGNOSIS — F17200 Nicotine dependence, unspecified, uncomplicated: Secondary | ICD-10-CM

## 2022-10-03 DIAGNOSIS — F5105 Insomnia due to other mental disorder: Secondary | ICD-10-CM

## 2022-10-03 DIAGNOSIS — F2089 Other schizophrenia: Secondary | ICD-10-CM

## 2022-10-03 DIAGNOSIS — Z79899 Other long term (current) drug therapy: Secondary | ICD-10-CM

## 2022-10-03 DIAGNOSIS — F99 Mental disorder, not otherwise specified: Secondary | ICD-10-CM

## 2022-10-03 MED ORDER — ARIPIPRAZOLE 15 MG PO TABS: 15.0000 mg | ORAL_TABLET | Freq: Every day | ORAL | 2 refills | Status: DC

## 2022-10-03 MED ORDER — SERTRALINE HCL 25 MG PO TABS
25.0000 mg | ORAL_TABLET | Freq: Every day | ORAL | 2 refills | Status: DC
Start: 2022-10-03 — End: 2022-12-13
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
## 2022-10-10 ENCOUNTER — Ambulatory Visit (INDEPENDENT_AMBULATORY_CARE_PROVIDER_SITE_OTHER): Payer: Medicare HMO | Admitting: Internal Medicine

## 2022-10-10 ENCOUNTER — Encounter: Payer: Self-pay | Admitting: Internal Medicine

## 2022-10-10 VITALS — BP 112/60 | HR 90 | Ht 70.0 in | Wt 250.4 lb

## 2022-10-10 DIAGNOSIS — E1169 Type 2 diabetes mellitus with other specified complication: Secondary | ICD-10-CM

## 2022-10-10 DIAGNOSIS — I10 Essential (primary) hypertension: Secondary | ICD-10-CM | POA: Diagnosis not present

## 2022-10-10 DIAGNOSIS — E785 Hyperlipidemia, unspecified: Secondary | ICD-10-CM

## 2022-10-10 DIAGNOSIS — F251 Schizoaffective disorder, depressive type: Secondary | ICD-10-CM

## 2022-10-10 DIAGNOSIS — E1142 Type 2 diabetes mellitus with diabetic polyneuropathy: Secondary | ICD-10-CM

## 2022-10-10 DIAGNOSIS — E1149 Type 2 diabetes mellitus with other diabetic neurological complication: Secondary | ICD-10-CM

## 2022-10-10 DIAGNOSIS — K219 Gastro-esophageal reflux disease without esophagitis: Secondary | ICD-10-CM

## 2022-10-10 DIAGNOSIS — N184 Chronic kidney disease, stage 4 (severe): Secondary | ICD-10-CM

## 2022-10-10 DIAGNOSIS — R600 Localized edema: Secondary | ICD-10-CM

## 2022-10-10 DIAGNOSIS — Z794 Long term (current) use of insulin: Secondary | ICD-10-CM

## 2022-10-10 MED ORDER — FUROSEMIDE 40 MG PO TABS
40.0000 mg | ORAL_TABLET | Freq: Every day | ORAL | 5 refills | Status: DC
Start: 2022-10-10 — End: 2023-04-17

## 2022-10-10 MED ORDER — GLIPIZIDE ER 5 MG PO TB24
5.0000 mg | ORAL_TABLET | Freq: Every day | ORAL | 5 refills | Status: DC
Start: 2022-10-10 — End: 2023-04-17

## 2022-10-10 MED ORDER — PANTOPRAZOLE SODIUM 40 MG PO TBEC: 40.0000 mg | DELAYED_RELEASE_TABLET | Freq: Every day | ORAL | 5 refills | Status: DC

## 2022-10-10 MED ORDER — LANTUS SOLOSTAR 100 UNIT/ML ~~LOC~~ SOPN
12.0000 [IU] | PEN_INJECTOR | Freq: Every day | SUBCUTANEOUS | 1 refills | Status: AC
Start: 2022-10-10 — End: ?

## 2022-10-10 MED ORDER — TIRZEPATIDE 5 MG/0.5ML ~~LOC~~ SOAJ
5.0000 mg | SUBCUTANEOUS | 1 refills | Status: DC
Start: 2022-10-10 — End: 2023-04-10

## 2022-10-10 MED ORDER — LOSARTAN POTASSIUM-HCTZ 50-12.5 MG PO TABS
1.0000 | ORAL_TABLET | Freq: Every day | ORAL | 5 refills | Status: DC
Start: 2022-10-10 — End: 2023-04-17

## 2022-10-10 MED ORDER — ATORVASTATIN CALCIUM 40 MG PO TABS: 40.0000 mg | ORAL_TABLET | Freq: Every day | ORAL | 5 refills | Status: DC

## 2022-10-10 MED ORDER — GABAPENTIN 300 MG PO CAPS: 300.0000 mg | ORAL_CAPSULE | Freq: Every day | ORAL | 5 refills | Status: DC

## 2022-10-10 MED ORDER — METOPROLOL SUCCINATE ER 50 MG PO TB24: 50.0000 mg | ORAL_TABLET | Freq: Every day | ORAL | 5 refills | Status: DC

## 2022-10-10 NOTE — Assessment & Plan Note (Signed)
AKI on CKD recently Likely due to dehydration Improved with gentle hydration Recently held metformin and additional 20 mg dose of Lasix Check CMP 

## 2022-10-10 NOTE — Assessment & Plan Note (Addendum)
BP Readings from Last 1 Encounters:  10/10/22 112/60   Well-controlled with Losartan-HCTZ and Metoprolol Also takes Lasix for LE swelling, advised to take Lasix 40 mg daily only for now DC Amlodipine as she has not been getting it anyways Counseled for compliance with the medications Advised DASH diet and moderate exercise/walking, at least 150 mins/week

## 2022-10-10 NOTE — Assessment & Plan Note (Signed)
On Lipitor Check lipid profile 

## 2022-10-10 NOTE — Assessment & Plan Note (Signed)
On Abilify and Zoloft Better controlled Followed by psychiatry

## 2022-10-10 NOTE — Patient Instructions (Addendum)
Please start taking Mounjaro 5 mg once weekly once you complete 2.5 mg dose supply. Please decrease Lantus to 12 U at bedtime once you start Mounjaro 5 mg dose.  Please continue to take other medications as prescribed.  Please continue to follow low carb diet and perform moderate exercise/walking at least 150 mins/week.

## 2022-10-10 NOTE — Assessment & Plan Note (Signed)
On gabapentin Followed by podiatry

## 2022-10-10 NOTE — Assessment & Plan Note (Addendum)
Lab Results  Component Value Date   HGBA1C 9.1 (H) 06/20/2022   Uncontrolled currently, better than prior On Lantus 20 U QD, held Metformin 1000 mg BID due to GFR < 30 On glipizide 5 mg daily On Mounjaro, increase dose as tolerated - 5 mg qw now Decreased dose of Lantus to 12 units nightly  Check CMP and HbA1c  Advised to follow diabetic diet On statin and ARB F/u CMP and lipid panel Diabetic eye exam: Advised to follow up with Ophthalmology for diabetic eye exam

## 2022-10-10 NOTE — Assessment & Plan Note (Signed)
On Pantoprazole Well controlled

## 2022-10-10 NOTE — Progress Notes (Signed)
Established Patient Office Visit  Subjective:  Patient ID: [name], female    DOB: 1966/03/23  Age: 57 y.o. MRN: [id number]  CC:  Chief Complaint  Patient presents with   Hypertension    Three month follow up    Diabetes    Three month follow up     HPI [name] is a 57 y.o. female with past medical history of HTN, uncontrolled type 2 DM with neuropathy, sigmoid colon ca. S/p partial colectomy, schizoaffective disorder and obesity who presents for f/u of her chronic medical conditions.  AKI: She was found to have AKI on CKD in the last visit. She has stopped taking metformin and additional dose of Lasix as advised.  Currently, she gets pillpack from her pharmacy and has started taking Glipizide instead of Metformin.  Chronic leg swelling: She has seen vascular surgery for evaluation of chronic leg swelling and leg ulcer, and was told to use compression stocking.  She also takes Lasix for it.  She has been trying to perform leg elevation as well.  Type II DM: She has started taking Mounjaro and tolerates it overall, complains of mild nausea, but is manageable.  She has resumed taking Lantus and glipizide. She has not brought blood glucose log, but reports that it has been running around 150s now.  Denies any episode of hypoglycemia.  She has lost 27 lbs in the 3 months with Mounjaro. She takes gabapentin for diabetic neuropathy.  She needs diabetic shoes.  HTN: Her BP is wnl today.  She has been taking losartan-HCTZ 50-12.5 mg daily. She does not have Amlodipine 5 mg daily.   She has had recent worsening of kidney function and her dose of Lasix was recently reduced to 40 mg QD from 60 mg QD.  She denies any headache, dizziness, chest pain or palpitations.  Schizoaffective disorder: She sees Dr. Adrian Blackwater currently.  She is placed on Zoloft.  She also takes Abilify and Wellbutrin. Her MoCA was 30/30 in 02/24.   Past Medical History:  Diagnosis Date   Anemia     Charcot's joint of foot, left    Depression    Diabetes mellitus without complication (HCC)    Hyperlipidemia    Hypertension    Schizoaffective disorder (HCC) 06/29/2015    Past Surgical History:  Procedure Laterality Date   CHOLECYSTECTOMY     COLONOSCOPY WITH PROPOFOL N/A 05/26/2019   Procedure: COLONOSCOPY WITH PROPOFOL;  Surgeon: Toledo, Boykin Nearing, MD;  Location: [facility];  Service: Gastroenterology;  Laterality: N/A;   ESOPHAGOGASTRODUODENOSCOPY (EGD) WITH PROPOFOL N/A 05/26/2019   Procedure: ESOPHAGOGASTRODUODENOSCOPY (EGD) WITH PROPOFOL;  Surgeon: Toledo, Boykin Nearing, MD;  Location: [facility];  Service: Gastroenterology;  Laterality: N/A;   IRRIGATION AND DEBRIDEMENT FOOT Right 02/26/2016   Procedure: RIGHT 2ND TOE DEBRIDEMENT;  Surgeon: Gwyneth Revels, DPM;  Location: [facility];  Service: Podiatry;  Laterality: Right;   IRRIGATION AND DEBRIDEMENT FOOT Left 12/13/2018   Procedure: IRRIGATION AND DEBRIDEMENT FOOT;  Surgeon: Rosetta Posner, DPM;  Location: [facility];  Service: Podiatry;  Laterality: Left;    Family History  Problem Relation Age of Onset   Breast cancer Cousin        maternal cousin   Hypertension Mother    Hyperthyroidism Mother    Dementia Mother    Prostate cancer Father    Diabetes Maternal Grandmother    Hypertension Maternal Grandmother    Cancer Maternal Grandmother        unknown type  Diabetes Maternal Grandfather    Hypertension Maternal Grandfather    Diabetes Paternal Grandmother    Hypertension Paternal Grandmother    Diabetes Paternal Grandfather    Hypertension Paternal Grandfather     Social History   Socioeconomic History   Marital status: Divorced    Spouse name: Not on file   Number of children: 2   Years of education: Not on file   Highest education level: Not on file  Occupational History   Not on file  Tobacco Use   Smoking status: Every Day    Packs/day: 1.00    Years: 30.00    Additional pack years: 0.00    Total  pack years: 30.00    Types: Cigarettes    Last attempt to quit: 01/30/2022    Years since quitting: 0.6   Smokeless tobacco: Never   Tobacco comments:    Began smoking again in January 2024  Vaping Use   Vaping Use: Never used  Substance and Sexual Activity   Alcohol use: No   Drug use: No   Sexual activity: Not Currently  Other Topics Concern   Not on file  Social History Narrative   At home with mom, 85. Lives in brothers house who moved out. Sister comes by every other day to check on patient and mom.   Social Determinants of Health   Financial Resource Strain: Low Risk  (11/04/2020)   Overall Financial Resource Strain (CARDIA)    Difficulty of Paying Living Expenses: Not hard at all  Food Insecurity: No Food Insecurity (01/16/2022)   Hunger Vital Sign    Worried About Running Out of Food in the Last Year: Never true    Ran Out of Food in the Last Year: Never true  Transportation Needs: No Transportation Needs (01/16/2022)   PRAPARE - Administrator, Civil Service (Medical): No    Lack of Transportation (Non-Medical): No  Physical Activity: Inactive (11/04/2020)   Exercise Vital Sign    Days of Exercise per Week: 0 days    Minutes of Exercise per Session: 0 min  Stress: No Stress Concern Present (11/04/2020)   [employer] of Occupational Health - Occupational Stress Questionnaire    Feeling of Stress : Only a little  Social Connections: Socially Isolated (11/04/2020)   Social Connection and Isolation Panel [NHANES]    Frequency of Communication with Friends and Family: More than three times a week    Frequency of Social Gatherings with Friends and Family: More than three times a week    Attends Religious Services: Never    Database administrator or Organizations: No    Attends Banker Meetings: Never    Marital Status: Divorced  Catering manager Violence: Not At Risk (01/16/2022)   Humiliation, Afraid, Rape, and Kick questionnaire    Fear of  Current or Ex-Partner: No    Emotionally Abused: No    Physically Abused: No    Sexually Abused: No    Outpatient Medications Prior to Visit  Medication Sig Dispense Refill   ACCU-CHEK GUIDE test strip USE TO CHECK BLOOD SUGAR FOUR TIMES DAILY 100 strip 0   Accu-Chek Softclix Lancets lancets USE TO CHECK BLOOD SUGAR FOUR TIMES DAILY 100 each 0   ARIPiprazole (ABILIFY) 15 MG tablet Take 1 tablet (15 mg total) by mouth daily. 30 tablet 2   Blood Glucose Monitoring Suppl (ACCU-CHEK GUIDE ME) w/Device KIT USE TO CHECK BLOOD SUGAR FOUR TIMES DAILY 1 kit 0  colestipol (COLESTID) 1 g tablet TAKE (2) TABLETS BY MOUTH TWICE DAILY. 120 tablet 0   Continuous Blood Gluc Sensor (DEXCOM G7 SENSOR) MISC Please use it to check blood glucose 3 times before meals, at bedtime and as needed. 3 each 11   Ferrous Sulfate 27 MG TABS Take 1 tablet by mouth daily.     fluticasone (FLONASE) 50 MCG/ACT nasal spray Place 2 sprays into both nostrils daily. 15.8 mL 0   guaiFENesin (MUCINEX) 600 MG 12 hr tablet Take 2 tablets (1,200 mg total) by mouth 2 (two) times daily as needed for to loosen phlegm. 30 tablet 0   Misc. Devices [facility]) MISC Use it to ambulate short distance. 1 each 0   mupirocin ointment (BACTROBAN) 2 % Apply 1 Application topically 2 (two) times daily. 22 g 0   sertraline (ZOLOFT) 25 MG tablet Take 1 tablet (25 mg total) by mouth daily. 30 tablet 2   Vitamin D, Ergocalciferol, (DRISDOL) 1.25 MG (50000 UNIT) CAPS capsule Take 1 capsule (50,000 Units total) by mouth every 7 (seven) days. 12 capsule 1   amLODipine (NORVASC) 5 MG tablet Take 1 tablet (5 mg total) by mouth daily. 90 tablet 0   atorvastatin (LIPITOR) 40 MG tablet TAKE (1) TABLET BY MOUTH ONCE DAILY. 30 tablet 0   furosemide (LASIX) 40 MG tablet Take 1 tablet (40 mg total) by mouth daily. 30 tablet 3   gabapentin (NEURONTIN) 300 MG capsule TAKE (1) TABLET DAILY AT BEDTIME. 30 capsule 0   glipiZIDE (GLUCOTROL XL) 5 MG 24 hr tablet TAKE 1  TABLET DAILY WITH BREAKFAST. 30 tablet 0   insulin glargine (LANTUS SOLOSTAR) 100 UNIT/ML Solostar Pen Inject 20 Units into the skin at bedtime. 15 mL 1   losartan-hydrochlorothiazide (HYZAAR) 50-12.5 MG tablet TAKE (1) TABLET BY MOUTH ONCE DAILY. 30 tablet 0   metoprolol succinate (TOPROL-XL) 50 MG 24 hr tablet TAKE (1) TABLET BY MOUTH ONCE DAILY. TAKE WITH OR IMMEDIATELY FOLLOWING A MEAL 30 tablet 0   MOUNJARO 2.5 MG/0.5ML Pen INJECT 2.5MG  INTO THE SKIN ONCE WEEKLY 2 mL 0   pantoprazole (PROTONIX) 40 MG tablet TAKE (1) TABLET BY MOUTH ONCE DAILY. 30 tablet 0   No facility-administered medications prior to visit.    No Known Allergies  ROS Review of Systems  Constitutional:  Negative for chills and fever.  HENT:  Negative for congestion, sinus pressure, sinus pain and sore throat.   Eyes:  Negative for pain and discharge.  Respiratory:  Negative for cough and shortness of breath.   Cardiovascular:  Positive for leg swelling. Negative for chest pain and palpitations.  Gastrointestinal:  Negative for abdominal pain, constipation, nausea and vomiting.  Endocrine: Negative for polydipsia and polyuria.  Genitourinary:  Negative for dysuria and hematuria.  Musculoskeletal:  Positive for arthralgias and gait problem. Negative for neck pain and neck stiffness.  Skin:  Negative for rash.  Neurological:  Negative for dizziness and weakness.  Psychiatric/Behavioral:  Negative for agitation and behavioral problems. The patient is nervous/anxious.       Objective:    Physical Exam Vitals reviewed.  Constitutional:      General: She is not in acute distress.    Appearance: She is obese. She is not diaphoretic.     Comments: Uses walker  HENT:     Head: Normocephalic and atraumatic.     Nose: Nose normal.     Mouth/Throat:     Mouth: Mucous membranes are moist.  Eyes:  General: No scleral icterus.    Extraocular Movements: Extraocular movements intact.  Cardiovascular:     Rate and  Rhythm: Normal rate and regular rhythm.     Heart sounds: Normal heart sounds. No murmur heard. Pulmonary:     Breath sounds: Normal breath sounds. No wheezing or rales.  Musculoskeletal:     Cervical back: Neck supple. No tenderness.     Right lower leg: Edema (1+) present.     Left lower leg: Edema (1+) present.  Skin:    General: Skin is warm.     Findings: No rash.  Neurological:     General: No focal deficit present.     Mental Status: She is alert and oriented to person, place, and time.     Sensory: Sensory deficit (B/l LE) present.     Motor: Weakness (3/5 - b/l LE) present.  Psychiatric:        Mood and Affect: Mood normal.        Behavior: Behavior normal.     BP 112/60 (BP Location: Right Arm)   Pulse 90   Ht 5\' 10"  (1.778 m)   Wt 250 lb 6.4 oz (113.6 kg)   SpO2 98%   BMI 35.93 kg/m  Wt Readings from Last 3 Encounters:  10/10/22 250 lb 6.4 oz (113.6 kg)  09/28/22 254 lb (115.2 kg)  06/20/22 277 lb 3.2 oz (125.7 kg)    Lab Results  Component Value Date   TSH 2.140 11/23/2021   Lab Results  Component Value Date   WBC 11.2 (H) 01/27/2022   HGB 10.9 (L) 01/27/2022   HCT 31.9 (L) 01/27/2022   MCV 88 01/27/2022   PLT 319 01/27/2022   Lab Results  Component Value Date   NA 139 06/20/2022   K 5.1 06/20/2022   CO2 17 (L) 06/20/2022   GLUCOSE 241 (H) 06/20/2022   BUN 39 (H) 06/20/2022   CREATININE 1.87 (H) 06/20/2022   BILITOT <0.2 06/20/2022   ALKPHOS 165 (H) 06/20/2022   AST 12 06/20/2022   ALT 16 06/20/2022   PROT 6.7 06/20/2022   ALBUMIN 4.2 06/20/2022   CALCIUM 9.3 06/20/2022   ANIONGAP 6 01/18/2022   EGFR 31 (L) 06/20/2022   Lab Results  Component Value Date   CHOL 168 11/23/2021   Lab Results  Component Value Date   HDL 40 11/23/2021   Lab Results  Component Value Date   LDLCALC 77 11/23/2021   Lab Results  Component Value Date   TRIG 313 (H) 11/23/2021   Lab Results  Component Value Date   CHOLHDL 4.2 11/23/2021   Lab  Results  Component Value Date   HGBA1C 9.1 (H) 06/20/2022      Assessment & Plan:   Problem List Items Addressed This Visit       Cardiovascular and Mediastinum   Essential hypertension (Chronic)    BP Readings from Last 1 Encounters:  10/10/22 112/60  Well-controlled with Losartan-HCTZ and Metoprolol Also takes Lasix for LE swelling, advised to take Lasix 40 mg daily only for now DC Amlodipine as she has not been getting it anyways Counseled for compliance with the medications Advised DASH diet and moderate exercise/walking, at least 150 mins/week      Relevant Medications   atorvastatin (LIPITOR) 40 MG tablet   furosemide (LASIX) 40 MG tablet   losartan-hydrochlorothiazide (HYZAAR) 50-12.5 MG tablet   metoprolol succinate (TOPROL-XL) 50 MG 24 hr tablet   Other Relevant Orders   CBC  Digestive   GERD (gastroesophageal reflux disease)    On Pantoprazole Well controlled      Relevant Medications   pantoprazole (PROTONIX) 40 MG tablet     Endocrine   Type 2 diabetes mellitus with neurological complications (HCC) - Primary    Lab Results  Component Value Date   HGBA1C 9.1 (H) 06/20/2022  Uncontrolled currently, better than prior On Lantus 20 U QD, held Metformin 1000 mg BID due to GFR < 30 On glipizide 5 mg daily On Mounjaro, increase dose as tolerated - 5 mg qw now Decreased dose of Lantus to 12 units nightly  Check CMP and HbA1c  Advised to follow diabetic diet On statin and ARB F/u CMP and lipid panel Diabetic eye exam: Advised to follow up with Ophthalmology for diabetic eye exam      Relevant Medications   tirzepatide (MOUNJARO) 5 MG/0.5ML Pen   atorvastatin (LIPITOR) 40 MG tablet   glipiZIDE (GLUCOTROL XL) 5 MG 24 hr tablet   insulin glargine (LANTUS SOLOSTAR) 100 UNIT/ML Solostar Pen   losartan-hydrochlorothiazide (HYZAAR) 50-12.5 MG tablet   Other Relevant Orders   CMP14+EGFR   Hemoglobin A1c   Urine Microalbumin w/creat. ratio    Hyperlipidemia associated with type 2 diabetes mellitus (HCC)    On Lipitor Check lipid profile      Relevant Medications   tirzepatide (MOUNJARO) 5 MG/0.5ML Pen   atorvastatin (LIPITOR) 40 MG tablet   furosemide (LASIX) 40 MG tablet   glipiZIDE (GLUCOTROL XL) 5 MG 24 hr tablet   insulin glargine (LANTUS SOLOSTAR) 100 UNIT/ML Solostar Pen   losartan-hydrochlorothiazide (HYZAAR) 50-12.5 MG tablet   metoprolol succinate (TOPROL-XL) 50 MG 24 hr tablet   Other Relevant Orders   Lipid Profile   Diabetic polyneuropathy associated with type 2 diabetes mellitus (HCC)    On gabapentin Followed by podiatry      Relevant Medications   tirzepatide (MOUNJARO) 5 MG/0.5ML Pen   atorvastatin (LIPITOR) 40 MG tablet   gabapentin (NEURONTIN) 300 MG capsule   glipiZIDE (GLUCOTROL XL) 5 MG 24 hr tablet   insulin glargine (LANTUS SOLOSTAR) 100 UNIT/ML Solostar Pen   losartan-hydrochlorothiazide (HYZAAR) 50-12.5 MG tablet     Genitourinary   CKD (chronic kidney disease) stage 4, GFR 15-29 ml/min (HCC)    AKI on CKD recently Likely due to dehydration Improved with gentle hydration Recently held metformin and additional 20 mg dose of Lasix Check CMP      Relevant Orders   CMP14+EGFR   CBC     Other   Schizoaffective disorder (HCC)    On Abilify and Zoloft Better controlled Followed by psychiatry      Peripheral edema    Chronic leg swelling, recently better Now improved with resuming Lasix Has history of Charcot's joint as well, which could also contribute to ankle swelling Leg elevation and compression stockings      Relevant Medications   furosemide (LASIX) 40 MG tablet   Meds ordered this encounter  Medications   tirzepatide (MOUNJARO) 5 MG/0.5ML Pen    Sig: Inject 5 mg into the skin once a week.    Dispense:  6 mL    Refill:  1   atorvastatin (LIPITOR) 40 MG tablet    Sig: Take 1 tablet (40 mg total) by mouth daily.    Dispense:  30 tablet    Refill:  5   furosemide  (LASIX) 40 MG tablet    Sig: Take 1 tablet (40 mg total) by mouth daily.  Dispense:  30 tablet    Refill:  5   gabapentin (NEURONTIN) 300 MG capsule    Sig: Take 1 capsule (300 mg total) by mouth at bedtime.    Dispense:  30 capsule    Refill:  5   glipiZIDE (GLUCOTROL XL) 5 MG 24 hr tablet    Sig: Take 1 tablet (5 mg total) by mouth daily with breakfast.    Dispense:  30 tablet    Refill:  5   insulin glargine (LANTUS SOLOSTAR) 100 UNIT/ML Solostar Pen    Sig: Inject 12 Units into the skin at bedtime.    Dispense:  9 mL    Refill:  1   losartan-hydrochlorothiazide (HYZAAR) 50-12.5 MG tablet    Sig: Take 1 tablet by mouth daily.    Dispense:  30 tablet    Refill:  5   metoprolol succinate (TOPROL-XL) 50 MG 24 hr tablet    Sig: Take 1 tablet (50 mg total) by mouth daily. Take with or immediately following a meal.    Dispense:  30 tablet    Refill:  5   pantoprazole (PROTONIX) 40 MG tablet    Sig: Take 1 tablet (40 mg total) by mouth daily.    Dispense:  30 tablet    Refill:  5    Follow-up: Return in about 3 months (around 01/10/2023) for Annual physical.    Anabel Halon, MD

## 2022-10-10 NOTE — Assessment & Plan Note (Signed)
Chronic leg swelling, recently better Now improved with resuming Lasix Has history of Charcot's joint as well, which could also contribute to ankle swelling Leg elevation and compression stockings

## 2022-10-11 LAB — CBC
Hemoglobin: 10.5 g/dL — ABNORMAL LOW (ref 11.1–15.9)
MCH: 27.9 pg (ref 26.6–33.0)
MCV: 86 fL (ref 79–97)
RBC: 3.76 x10E6/uL — ABNORMAL LOW (ref 3.77–5.28)

## 2022-10-11 LAB — LIPID PANEL
Chol/HDL Ratio: 5.9 ratio — ABNORMAL HIGH (ref 0.0–4.4)
Cholesterol, Total: 194 mg/dL (ref 100–199)
HDL: 33 mg/dL — ABNORMAL LOW (ref >39)
LDL Chol Calc (NIH): 109 mg/dL — ABNORMAL HIGH (ref 0–99)
Triglycerides: 302 mg/dL — ABNORMAL HIGH (ref 0–149)
VLDL Cholesterol Cal: 52 mg/dL — ABNORMAL HIGH (ref 5–40)

## 2022-10-11 LAB — CMP14+EGFR
ALT: 13 IU/L (ref 0–32)
AST: 11 IU/L (ref 0–40)
Bilirubin Total: <0.2 mg/dL (ref 0.0–1.2)
CO2: 20 mmol/L (ref 20–29)
Calcium: 9.4 mg/dL (ref 8.7–10.2)
Chloride: 106 mmol/L (ref 96–106)
Globulin, Total: 2.8 g/dL (ref 1.5–4.5)
Potassium: 4.6 mmol/L (ref 3.5–5.2)
Total Protein: 6.7 g/dL (ref 6.0–8.5)

## 2022-10-11 LAB — HEMOGLOBIN A1C: Est. average glucose Bld gHb Est-mCnc: 157 mg/dL

## 2022-10-12 LAB — HEMOGLOBIN A1C: Hgb A1c MFr Bld: 7.1 % — ABNORMAL HIGH (ref 4.8–5.6)

## 2022-10-12 LAB — CMP14+EGFR
Albumin/Globulin Ratio: 1.4
Albumin: 3.9 g/dL (ref 3.8–4.9)
Alkaline Phosphatase: 159 IU/L — ABNORMAL HIGH (ref 44–121)
BUN/Creatinine Ratio: 20 (ref 9–23)
BUN: 38 mg/dL — ABNORMAL HIGH (ref 6–24)
Creatinine, Ser: 1.88 mg/dL — ABNORMAL HIGH (ref 0.57–1.00)
Glucose: 115 mg/dL — ABNORMAL HIGH (ref 70–99)
Sodium: 139 mmol/L (ref 134–144)
eGFR: 31 mL/min/{1.73_m2} — ABNORMAL LOW (ref >59)

## 2022-10-12 LAB — CBC
Hematocrit: 32.4 % — ABNORMAL LOW (ref 34.0–46.6)
MCHC: 32.4 g/dL (ref 31.5–35.7)
Platelets: 296 10*3/uL (ref 150–450)
RDW: 16.1 % — ABNORMAL HIGH (ref 11.7–15.4)
WBC: 11.2 10*3/uL — ABNORMAL HIGH (ref 3.4–10.8)

## 2022-10-12 LAB — MICROALBUMIN / CREATININE URINE RATIO
Creatinine, Urine: 80.3 mg/dL
Microalb/Creat Ratio: 77 mg/g creat — ABNORMAL HIGH (ref 0–29)
Microalbumin, Urine: 61.7 ug/mL

## 2022-10-13 ENCOUNTER — Ambulatory Visit (INDEPENDENT_AMBULATORY_CARE_PROVIDER_SITE_OTHER): Payer: Medicare HMO | Admitting: Podiatry

## 2022-10-13 DIAGNOSIS — Z91199 Patient's noncompliance with other medical treatment and regimen due to unspecified reason: Secondary | ICD-10-CM

## 2022-10-16 NOTE — Progress Notes (Signed)
1. No-show for appointment     

## 2022-10-17 ENCOUNTER — Telehealth: Payer: Self-pay | Admitting: Internal Medicine

## 2022-10-17 NOTE — Telephone Encounter (Signed)
Prescription Request  10/17/2022  LOV: 10/10/2022  What is the name of the medication or equipment? tirzepatide [facility]) 5 MG/0.5ML Pen   Have you contacted your pharmacy to request a refill? Yes   Which pharmacy would you like this sent to?  [facility] - [place], [place] - [address] [place] [address] Phone: [phone] Fax: [phone]    Patient notified that their request is being sent to the clinical staff for review and that they should receive a response within 2 business days.   Please advise at [facility] [phone]

## 2022-10-17 NOTE — Telephone Encounter (Signed)
Spoke to patient, advised refill was sent in and ready for pick up

## 2022-11-14 ENCOUNTER — Encounter: Payer: Self-pay | Admitting: Oncology

## 2022-11-28 ENCOUNTER — Encounter: Payer: Self-pay | Admitting: Oncology

## 2022-11-30 ENCOUNTER — Ambulatory Visit: Payer: Medicare HMO | Admitting: Internal Medicine

## 2022-12-01 ENCOUNTER — Telehealth: Payer: Self-pay | Admitting: Internal Medicine

## 2022-12-01 NOTE — Telephone Encounter (Signed)
Patient would like to have another referral sent to the [facility], says she is having some issues with diarrhea and is looking to be seen. Please advise Thank you

## 2022-12-01 NOTE — Telephone Encounter (Signed)
Patient called asking for referral for [facility].  Fax # [phone]

## 2022-12-02 ENCOUNTER — Encounter (INDEPENDENT_AMBULATORY_CARE_PROVIDER_SITE_OTHER): Payer: Self-pay | Admitting: *Deleted

## 2022-12-02 ENCOUNTER — Encounter: Payer: Self-pay | Admitting: Internal Medicine

## 2022-12-02 ENCOUNTER — Other Ambulatory Visit: Payer: Self-pay | Admitting: Internal Medicine

## 2022-12-02 DIAGNOSIS — Z1211 Encounter for screening for malignant neoplasm of colon: Secondary | ICD-10-CM

## 2022-12-02 NOTE — Telephone Encounter (Signed)
Referral entered again.

## 2022-12-02 NOTE — Telephone Encounter (Signed)
Patient scheduled for therapy on 12/06/2022

## 2022-12-05 ENCOUNTER — Other Ambulatory Visit: Payer: Self-pay

## 2022-12-05 ENCOUNTER — Telehealth: Payer: Self-pay | Admitting: Internal Medicine

## 2022-12-05 DIAGNOSIS — R5381 Other malaise: Secondary | ICD-10-CM

## 2022-12-05 NOTE — Telephone Encounter (Signed)
Beth called from [facility] and needs some referral information along with patient demographics fax to [phone].

## 2022-12-06 ENCOUNTER — Telehealth: Payer: Self-pay | Admitting: Internal Medicine

## 2022-12-06 NOTE — Telephone Encounter (Signed)
Beth called back from [facility] needs referral and demographics for insurance fax to [place] [facility] fax # [phone]. front desk has faxed the demographics with insurance. Patient has appointment tomorrow.

## 2022-12-06 NOTE — Telephone Encounter (Signed)
Beth from  [facility] calling says they never received referral for pt and need that along with pt demographics. Fax # [phone]. Please advise Thank you

## 2022-12-07 ENCOUNTER — Encounter: Payer: Self-pay | Admitting: Podiatry

## 2022-12-07 ENCOUNTER — Ambulatory Visit (INDEPENDENT_AMBULATORY_CARE_PROVIDER_SITE_OTHER): Payer: Medicare HMO | Admitting: Podiatry

## 2022-12-07 DIAGNOSIS — M79675 Pain in left toe(s): Secondary | ICD-10-CM

## 2022-12-07 DIAGNOSIS — B351 Tinea unguium: Secondary | ICD-10-CM

## 2022-12-07 DIAGNOSIS — L03116 Cellulitis of left lower limb: Secondary | ICD-10-CM | POA: Diagnosis not present

## 2022-12-07 DIAGNOSIS — M79674 Pain in right toe(s): Secondary | ICD-10-CM | POA: Diagnosis not present

## 2022-12-07 MED ORDER — CEPHALEXIN 500 MG PO CAPS: 500.0000 mg | ORAL_CAPSULE | Freq: Four times a day (QID) | ORAL | 0 refills | Status: AC

## 2022-12-07 NOTE — Patient Instructions (Signed)
If the redness does not improve with the antibiotics schedule an appointment with your primary care doctor

## 2022-12-08 ENCOUNTER — Ambulatory Visit (INDEPENDENT_AMBULATORY_CARE_PROVIDER_SITE_OTHER): Payer: Medicare HMO | Admitting: Gastroenterology

## 2022-12-08 ENCOUNTER — Encounter (INDEPENDENT_AMBULATORY_CARE_PROVIDER_SITE_OTHER): Payer: Self-pay | Admitting: Gastroenterology

## 2022-12-08 VITALS — BP 111/75 | HR 89 | Temp 98.0°F | Ht 70.0 in | Wt 252.3 lb

## 2022-12-08 DIAGNOSIS — C187 Malignant neoplasm of sigmoid colon: Secondary | ICD-10-CM | POA: Diagnosis not present

## 2022-12-08 DIAGNOSIS — K529 Noninfective gastroenteritis and colitis, unspecified: Secondary | ICD-10-CM | POA: Diagnosis not present

## 2022-12-08 MED ORDER — COLESTIPOL HCL 1 G PO TABS: ORAL_TABLET | ORAL | 0 refills | Status: DC

## 2022-12-08 NOTE — Patient Instructions (Signed)
Please start colestid 2g twice daily to help with diarrhea Please continue to avoid dairy and greasy foods We will schedule you for upper endoscopy and colonoscopy to evaluate further for your diarrhea, anemia and to ensure no recurrence of colon cancer  Follow up 4 months  It was a pleasure to see you today. I want to create trusting relationships with patients and provide genuine, compassionate, and quality care. I truly value your feedback! please be on the lookout for a survey regarding your visit with me today. I appreciate your input about our visit and your time in completing this!     L. Jeanmarie Hubert, MSN, APRN, AGNP-C Adult-Gerontology Nurse Practitioner [facility] at [facility]

## 2022-12-08 NOTE — Progress Notes (Addendum)
Referring Provider: Anabel Halon, MD Primary Care Physician:  Anabel Halon, MD Primary GI Physician: Dr. Levon Hedger   Chief Complaint  Patient presents with   Diarrhea    Having diarrhea for 2 -3 years. States when she eats food goes straight through her and has accidents at night while sleeping. Has not tried any treatments. History of colon cancer. Prescribed colestipol and never took any.    HPI:   [name] is a 57 y.o. 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 Watery stools with 2-3 BMs per day. Did not try colestid prescribed to her. Having some fecal soiling. Patient also had recent IDA noted on labs  Recommended to have EGD/Colonoscopy, start colestid 2g daily, avoid dairy  She was again lost to follow up for endoscopic evaluation.  Last hemoglobin in June was 10.5 In August 2023 iron was 34, TIBC 302, sat 11, ferritin 61, B12 248, folate 6  Present:  She continues with diarrhea, usually 2-3 episodes after eating. She notes some days she can avoid having as many episodes of diarrhea if she does not eat dairy or greasy foods as these tend to make thing worse. She denies abdominal pain. She has had several episodes of fecal incontinence while sleeping. She is not taking anything for her diarrhea currently. She never tried colestid in the past but is unsure why. No rectal bleeding or melena. She note some upper abdominal bloating at times as well.   She has lost some weight on mounjaro. Appetite has fluctuated some on mounjaro as well. No nausea or vomiting.   She is not taking iron pills but unsure why. She states she had to have iron infusions  many years ago.   Last EGD: 05/26/2019-small sessile polyps between 3 to 7 mm in the gastric fundus which were biopsied.  Otherwise normal exam.  Pathology negative for H. pylori or any other ulcerations.  Biopsies of GE junction were negative for EOE or dysplastic changes, changes consistent with mild esophagitis.   Last Colonoscopy: January 2021 showed a partially obstructing tumor in the mid sigmoid colon which was biopsied and tattooed, rest of exam was within normal limits.  Pathology of biopsies compatible with adenocarcinoma.    Past Medical History:  Diagnosis Date   Anemia    Charcot's joint of foot, left    Depression    Diabetes mellitus without complication (HCC)    Hyperlipidemia    Hypertension    Schizoaffective disorder (HCC) 06/29/2015    Past Surgical History:  Procedure Laterality Date   CHOLECYSTECTOMY     COLONOSCOPY WITH PROPOFOL N/A 05/26/2019   Procedure: COLONOSCOPY WITH PROPOFOL;  Surgeon: Toledo, Boykin Nearing, MD;  Location: [facility];  Service: Gastroenterology;  Laterality: N/A;   ESOPHAGOGASTRODUODENOSCOPY (EGD) WITH PROPOFOL N/A 05/26/2019   Procedure: ESOPHAGOGASTRODUODENOSCOPY (EGD) WITH PROPOFOL;  Surgeon: Toledo, Boykin Nearing, MD;  Location: [facility];  Service: Gastroenterology;  Laterality: N/A;   IRRIGATION AND DEBRIDEMENT FOOT Right 02/26/2016   Procedure: RIGHT 2ND TOE DEBRIDEMENT;  Surgeon: Gwyneth Revels, DPM;  Location: [facility];  Service: Podiatry;  Laterality: Right;   IRRIGATION AND DEBRIDEMENT FOOT Left 12/13/2018   Procedure: IRRIGATION AND DEBRIDEMENT FOOT;  Surgeon: Rosetta Posner, DPM;  Location: [facility];  Service: Podiatry;  Laterality: Left;    Current Outpatient Medications  Medication Sig Dispense Refill   ACCU-CHEK GUIDE test strip USE TO CHECK BLOOD SUGAR FOUR TIMES DAILY 100 strip 0   Accu-Chek Softclix Lancets lancets USE TO CHECK BLOOD SUGAR FOUR TIMES DAILY 100 each 0   ARIPiprazole (ABILIFY) 15 MG tablet Take 1 tablet (15  mg total) by mouth daily. 30 tablet 2   atorvastatin (LIPITOR) 40 MG tablet Take 1 tablet (40 mg total) by mouth daily. 30 tablet 5   Blood Glucose Monitoring Suppl (ACCU-CHEK GUIDE ME) w/Device KIT USE TO CHECK BLOOD SUGAR FOUR TIMES DAILY 1 kit 0   cephALEXin (KEFLEX) 500 MG capsule Take 1 capsule (500 mg total) by mouth 4 (four) times daily for 14 days. 56 capsule 0   fluticasone (FLONASE) 50 MCG/ACT nasal spray Place 2 sprays into both nostrils daily. 15.8 mL 0   furosemide (LASIX) 40 MG tablet Take 1 tablet (40 mg total) by mouth daily. 30 tablet 5   gabapentin (NEURONTIN) 300 MG capsule Take 1 capsule (300 mg total) by mouth at bedtime. 30 capsule 5   glipiZIDE (GLUCOTROL XL) 5 MG 24 hr tablet Take 1 tablet (5 mg total) by mouth daily with breakfast. 30 tablet 5   guaiFENesin (MUCINEX) 600 MG 12 hr tablet Take 2 tablets (1,200 mg total) by mouth 2 (two) times daily as needed for to loosen phlegm. 30 tablet 0   insulin glargine (LANTUS SOLOSTAR) 100 UNIT/ML Solostar Pen Inject 12 Units into the skin at bedtime. 9 mL 1   losartan-hydrochlorothiazide (HYZAAR) 50-12.5 MG tablet Take 1 tablet by mouth daily. 30 tablet 5   metoprolol succinate (TOPROL-XL) 50 MG 24 hr tablet Take 1 tablet (50 mg total) by mouth daily. Take with or immediately following a meal. 30 tablet 5   mupirocin ointment (BACTROBAN) 2 % Apply 1 Application topically 2 (two) times daily. 22 g 0   pantoprazole (PROTONIX) 40 MG tablet Take 1 tablet (40 mg total) by mouth daily. 30 tablet 5   sertraline (ZOLOFT) 25 MG tablet Take 1 tablet (25 mg total) by mouth daily. 30 tablet 2   tirzepatide (MOUNJARO) 5 MG/0.5ML Pen Inject 5 mg into the skin once a week. 6 mL 1   colestipol (COLESTID) 1 g tablet TAKE (2) TABLETS BY MOUTH TWICE DAILY. (Patient not taking: Reported on 12/08/2022) 120 tablet 0   Continuous Blood Gluc Sensor (DEXCOM G7 SENSOR) MISC Please use it to check blood glucose 3 times before meals, at bedtime and as needed.  (Patient not taking: Reported on 12/08/2022) 3 each 11   Ferrous Sulfate 27 MG TABS Take 1 tablet by mouth daily. (Patient not taking: Reported on 12/08/2022)     Misc. Devices [facility]) MISC Use it to ambulate short distance. 1 each 0   No current facility-administered medications for this visit.    Allergies as of 12/08/2022   (No Known Allergies)    Family History  Problem Relation Age of Onset   Breast cancer Cousin        maternal cousin   Hypertension Mother    Hyperthyroidism Mother    Dementia Mother    Prostate cancer Father    Diabetes Maternal Grandmother    Hypertension Maternal Grandmother    Cancer Maternal Grandmother        unknown type   Diabetes Maternal Grandfather    Hypertension Maternal Grandfather    Diabetes Paternal Grandmother  Hypertension Paternal Grandmother    Diabetes Paternal Grandfather    Hypertension Paternal Grandfather     Social History   Socioeconomic History   Marital status: Divorced    Spouse name: Not on file   Number of children: 2   Years of education: Not on file   Highest education level: Not on file  Occupational History   Not on file  Tobacco Use   Smoking status: Every Day    Current packs/day: 0.00    Average packs/day: 1 pack/day for 30.0 years (30.0 ttl pk-yrs)    Types: Cigarettes    Start date: 01/31/1992    Last attempt to quit: 01/30/2022    Years since quitting: 0.8   Smokeless tobacco: Never   Tobacco comments:    Began smoking again in January 2024  Vaping Use   Vaping status: Never Used  Substance and Sexual Activity   Alcohol use: No   Drug use: No   Sexual activity: Not Currently  Other Topics Concern   Not on file  Social History Narrative   At home with mom, 85. Lives in brothers house who moved out. Sister comes by every other day to check on patient and mom.   Social Determinants of Health   Financial Resource Strain: Low Risk  (11/04/2020)   Overall Financial Resource Strain (CARDIA)     Difficulty of Paying Living Expenses: Not hard at all  Food Insecurity: No Food Insecurity (01/16/2022)   Hunger Vital Sign    Worried About Running Out of Food in the Last Year: Never true    Ran Out of Food in the Last Year: Never true  Transportation Needs: No Transportation Needs (01/16/2022)   PRAPARE - Administrator, Civil Service (Medical): No    Lack of Transportation (Non-Medical): No  Physical Activity: Inactive (11/04/2020)   Exercise Vital Sign    Days of Exercise per Week: 0 days    Minutes of Exercise per Session: 0 min  Stress: No Stress Concern Present (11/04/2020)   [name]    Feeling of Stress : Only a little  Social Connections: Socially Isolated (11/04/2020)   Social Connection and Isolation Panel [NHANES]    Frequency of Communication with Friends and Family: More than three times a week    Frequency of Social Gatherings with Friends and Family: More than three times a week    Attends Religious Services: Never    Database administrator or Organizations: No    Attends Engineer, structural: Never    Marital Status: Divorced   Review of systems General: negative for malaise, night sweats, fever, chills, weight loss Neck: Negative for lumps, goiter, pain and significant neck swelling Resp: Negative for cough, wheezing, dyspnea at rest CV: Negative for chest pain, leg swelling, palpitations, orthopnea GI: denies melena, hematochezia, nausea, vomiting, constipation, dysphagia, odyonophagia, early satiety or unintentional weight loss. +diarrhea  MSK: Negative for joint pain or swelling, back pain, and muscle pain. Derm: Negative for itching or rash Psych: Denies depression, anxiety, memory loss, confusion. No homicidal or suicidal ideation.  Heme: Negative for prolonged bleeding, bruising easily, and swollen nodes. Endocrine: Negative for cold or heat intolerance, polyuria, polydipsia  and goiter. Neuro: negative for tremor, gait imbalance, syncope and seizures. The remainder of the review of systems is noncontributory.  Physical Exam: BP 111/75 (BP Location: Left Arm, Patient Position: Sitting, Cuff Size: Large)   Pulse 89  Temp 98 F (36.7 C) (Oral)   Ht 5\' 10"  (1.778 m)   Wt 252 lb 4.8 oz (114.4 kg)   BMI 36.20 kg/m  General:   Alert and oriented. No distress noted. Pleasant and cooperative.  Head:  Normocephalic and atraumatic. Eyes:  Conjuctiva clear without scleral icterus. Mouth:  Oral mucosa pink and moist. Good dentition. No lesions. Heart: Normal rate and rhythm, s1 and s2 heart sounds present.  Lungs: Clear lung sounds in all lobes. Respirations equal and unlabored. Abdomen:  +BS, soft, non-tender and non-distended. No rebound or guarding. No HSM or masses noted. Derm: No palmar erythema or jaundice Msk:  Symmetrical without gross deformities. Normal posture. Extremities:  Without edema. Neurologic:  Alert and  oriented x4 Psych:  Alert and cooperative. Normal mood and affect.  Invalid input(s): "6 MONTHS"   ASSESSMENT: TOINI SETA is a 57 y.o. female presenting today for follow up of diarrhea and history of Colon cancer   Diarrhea for the past 2-3 years, she thinks it began after partial colectomy secondary to adenocarcinoma of the sigmoid colon in 2021. She has been lost to follow up for repeat colonoscopy for colon cancer surveillance and for further evaluation of her diarrhea. While diarrhea may likely be secondary to partial colectomy, cannot rule out other causes such as microscopic colitis. She was prescribed colestid 2g previously but has yet to try this. I encouraged her to start colestid for her diarrhea and avoid dairy and greasy foods. We will get her rescheduled for colonoscopy.   She has also had mild anemia for the past few years, previously on iron pills but not currently. She has been recommended to have EGD for further  evaluation of this along with colonoscopy as above but was lost to follow up for this as well. Would recommend proceeding with EGD at this time, can also obtain small bowel biopsies to rule out underlying celiac disease as a contributor to her diarrhea.   Indications, risks and benefits of procedure discussed in detail with patient. Patient verbalized understanding and is in agreement to proceed with EGD/Colonoscopy with small bowel and random colonic biopsies.  PLAN:  Schedule EGD with small bowel biopsies for celiac/Colonoscopy with random biopsies-ASA III  2.  Start colestid 2g BID  3. Avoid dairy and greasy foods   All questions were answered, patient verbalized understanding and is in agreement with plan as outlined above.   Follow Up: 4 months    L. Jeanmarie Hubert, MSN, APRN, AGNP-C Adult-Gerontology Nurse Practitioner [facility]  I have reviewed the note and agree with the APP's assessment as described in this progress note  Katrinka Blazing, MD Gastroenterology and Hepatology [facility]

## 2022-12-08 NOTE — Progress Notes (Signed)
  Subjective:  Patient ID: [name], female    DOB: 08/26/1965,  MRN: [id number]  Chief Complaint  Patient presents with   Diabetes    "Cut my toenails.  My leg don't look well."    57 y.o. female presents with the above complaint. History confirmed with patient.  Returns for follow-up with thickened elongated painful toenails, prior debridements have been helpful.  She has been unable to cut them.  She has a new issue of redness and swelling in the left leg as well  Objective:  Physical Exam: warm, good capillary refill, no trophic changes or ulcerative lesions, normal DP and PT pulses, and abnormal sensory exam with loss of protective sensation. Left Foot: dystrophic yellowed discolored nail plates with subungual debris, left leg has areas of weeping and erythema consistent with cellulitis there is no evidence of DVT in the calf or popliteal fossa, negative Homans' sign Right Foot: dystrophic yellowed discolored nail plates with subungual debris   Assessment:   1. Pain due to onychomycosis of toenails of both feet   2. Cellulitis of left leg      Plan:  Patient was evaluated and treated and all questions answered.  She has new cellulitis developing in the left leg.  I prescribed her cephalexin 500 mg 4 times daily.  I discussed with her this likely will take 2 to 3 weeks to fully resolve, she will schedule an appointment to follow-up with her PCP regarding this within that timeframe.  Discussed with her if worsening or antibiotics are not effective at reducing it that she should proceed to the emergency room for evaluation.  Discussed the etiology and treatment options for the condition in detail with the patient. Educated patient on the topical and oral treatment options for mycotic nails. Recommended debridement of the nails today. Sharp and mechanical debridement performed of all painful and mycotic nails today. Nails debrided in length and thickness using a nail nipper  to level of comfort. Discussed treatment options including appropriate shoe gear. Follow up as needed for painful nails.   Return if symptoms worsen or fail to improve.

## 2022-12-09 ENCOUNTER — Telehealth (INDEPENDENT_AMBULATORY_CARE_PROVIDER_SITE_OTHER): Payer: Self-pay | Admitting: Gastroenterology

## 2022-12-09 DIAGNOSIS — K529 Noninfective gastroenteritis and colitis, unspecified: Secondary | ICD-10-CM

## 2022-12-09 NOTE — Telephone Encounter (Signed)
Marguerita Merles, Reuel Boom, MD  to Me     12/08/22  5:09 PM FYI This patient can be scheduled in room 1.

## 2022-12-09 NOTE — Telephone Encounter (Signed)
Attempted to reach pt but voicemail is full;unable to leave message.Pt was seen in office yesterday and needing TCS/EGD with Dr.Castaneda. Room 1. Pt will need to hold Mounjaro and iron. Pt on lantus and glipizide.

## 2022-12-13 ENCOUNTER — Telehealth (INDEPENDENT_AMBULATORY_CARE_PROVIDER_SITE_OTHER): Payer: Medicare HMO | Admitting: Psychiatry

## 2022-12-13 ENCOUNTER — Encounter (HOSPITAL_COMMUNITY): Payer: Self-pay | Admitting: Psychiatry

## 2022-12-13 DIAGNOSIS — F2089 Other schizophrenia: Secondary | ICD-10-CM

## 2022-12-13 DIAGNOSIS — F5105 Insomnia due to other mental disorder: Secondary | ICD-10-CM

## 2022-12-13 DIAGNOSIS — F411 Generalized anxiety disorder: Secondary | ICD-10-CM | POA: Diagnosis not present

## 2022-12-13 DIAGNOSIS — Z79899 Other long term (current) drug therapy: Secondary | ICD-10-CM | POA: Diagnosis not present

## 2022-12-13 DIAGNOSIS — G4700 Insomnia, unspecified: Secondary | ICD-10-CM | POA: Diagnosis not present

## 2022-12-13 DIAGNOSIS — Z720 Tobacco use: Secondary | ICD-10-CM

## 2022-12-13 DIAGNOSIS — F17210 Nicotine dependence, cigarettes, uncomplicated: Secondary | ICD-10-CM | POA: Diagnosis not present

## 2022-12-13 MED ORDER — ARIPIPRAZOLE 15 MG PO TABS
15.0000 mg | ORAL_TABLET | Freq: Every day | ORAL | 2 refills | Status: DC
Start: 2022-12-13 — End: 2023-02-13

## 2022-12-13 MED ORDER — SERTRALINE HCL 25 MG PO TABS
25.0000 mg | ORAL_TABLET | Freq: Every day | ORAL | 2 refills | Status: DC
Start: 2022-12-13 — End: 2023-02-13

## 2022-12-13 NOTE — Patient Instructions (Addendum)
Here is the quit Line that we talked about in session that can send you free smoking cessation resources: [email or web address]  When you see Dr. Allena Katz next please have him obtain an EKG to check your heart while you are on the Abilify.
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
## 2022-12-20 MED ORDER — PEG 3350-KCL-NA BICARB-NACL 420 G PO SOLR: 4000.0000 mL | Freq: Once | ORAL | 0 refills | Status: AC

## 2022-12-20 NOTE — Addendum Note (Signed)
Addended by: Marlowe Shores on: 12/20/2022 02:36 PM   Modules accepted: Orders

## 2022-12-27 ENCOUNTER — Ambulatory Visit: Payer: Medicare HMO | Admitting: Internal Medicine

## 2023-01-04 ENCOUNTER — Telehealth: Payer: Self-pay | Admitting: Internal Medicine

## 2023-01-04 NOTE — Telephone Encounter (Signed)
Called patient to scheduled diabetic eye exam in our office, patient goes to [facility], Dr Charise Killian, [place].

## 2023-01-04 NOTE — Telephone Encounter (Signed)
Faxed for records

## 2023-01-10 ENCOUNTER — Encounter: Payer: Medicare HMO | Admitting: Internal Medicine

## 2023-01-12 ENCOUNTER — Encounter: Payer: Self-pay | Admitting: Internal Medicine

## 2023-01-12 ENCOUNTER — Ambulatory Visit (INDEPENDENT_AMBULATORY_CARE_PROVIDER_SITE_OTHER): Payer: Medicare HMO | Admitting: Internal Medicine

## 2023-01-12 VITALS — BP 114/60 | HR 74 | Ht 70.0 in | Wt 246.6 lb

## 2023-01-12 DIAGNOSIS — E1149 Type 2 diabetes mellitus with other diabetic neurological complication: Secondary | ICD-10-CM | POA: Diagnosis not present

## 2023-01-12 DIAGNOSIS — C187 Malignant neoplasm of sigmoid colon: Secondary | ICD-10-CM | POA: Diagnosis not present

## 2023-01-12 DIAGNOSIS — I10 Essential (primary) hypertension: Secondary | ICD-10-CM

## 2023-01-12 DIAGNOSIS — E1169 Type 2 diabetes mellitus with other specified complication: Secondary | ICD-10-CM | POA: Diagnosis not present

## 2023-01-12 DIAGNOSIS — F17210 Nicotine dependence, cigarettes, uncomplicated: Secondary | ICD-10-CM | POA: Diagnosis not present

## 2023-01-12 DIAGNOSIS — L03116 Cellulitis of left lower limb: Secondary | ICD-10-CM | POA: Diagnosis not present

## 2023-01-12 DIAGNOSIS — F251 Schizoaffective disorder, depressive type: Secondary | ICD-10-CM

## 2023-01-12 DIAGNOSIS — Z720 Tobacco use: Secondary | ICD-10-CM

## 2023-01-12 DIAGNOSIS — N184 Chronic kidney disease, stage 4 (severe): Secondary | ICD-10-CM

## 2023-01-12 DIAGNOSIS — Z23 Encounter for immunization: Secondary | ICD-10-CM

## 2023-01-12 DIAGNOSIS — Z794 Long term (current) use of insulin: Secondary | ICD-10-CM

## 2023-01-12 DIAGNOSIS — E785 Hyperlipidemia, unspecified: Secondary | ICD-10-CM

## 2023-01-12 DIAGNOSIS — Z0001 Encounter for general adult medical examination with abnormal findings: Secondary | ICD-10-CM

## 2023-01-12 MED ORDER — ACCU-CHEK GUIDE ME W/DEVICE KIT: PACK | 0 refills | Status: AC

## 2023-01-12 MED ORDER — DOXYCYCLINE HYCLATE 100 MG PO TABS: 100.0000 mg | ORAL_TABLET | Freq: Two times a day (BID) | ORAL | 0 refills | Status: DC

## 2023-01-12 NOTE — Progress Notes (Signed)
Established Patient Office Visit  Subjective:  Patient ID: [name], female    DOB: March 14, 1966  Age: 57 y.o. MRN: [id number]  CC:  Chief Complaint  Patient presents with   Annual Exam   Leg Pain    Laceration and swelling of lower rt. Leg.    Immunizations    Consented to flu today    HPI [name] is a 57 y.o. female with past medical history of HTN, uncontrolled type 2 DM with neuropathy, sigmoid colon ca. S/p partial colectomy, schizoaffective disorder and obesity who presents for annual physical.  She has left leg redness, warmth and mild swelling for the last 2 weeks.  She recently completed antibiotic without much relief.  Denies any fever or chills currently.  Chronic leg swelling: She has seen vascular surgery for evaluation of chronic leg swelling and leg ulcer, and was told to use compression stocking.  She also takes Lasix for it.  She has been trying to perform leg elevation as well.   Type II DM: She has been taking Mounjaro 5 mg qw and tolerates it overall, complains of mild nausea, but is manageable.  She has resumed taking Lantus 12 U qHS and glipizide 5 mg QD. She has not brought blood glucose log, but reports that it has been running around 120-150 now.  Denies any episode of hypoglycemia.  She has lost 31 lbs in the 6 months with Mounjaro. She takes gabapentin for diabetic neuropathy.  She needs diabetic shoes.  HTN: Her BP is wnl today.  She has been taking losartan-HCTZ 50-12.5 mg daily. She has had recent worsening of kidney function and her dose of Lasix was later reduced to 40 mg QD from 60 mg QD.  She denies any headache, dizziness, chest pain or palpitations.  Schizoaffective disorder: She sees Dr. Adrian Blackwater currently.  She is placed on Zoloft.  She also takes Abilify and Wellbutrin. Her MoCA was 30/30 in 02/24.    Past Medical History:  Diagnosis Date   Anemia    Charcot's joint of foot, left    Depression    Diabetes mellitus  without complication (HCC)    Hyperlipidemia    Hypertension    Schizoaffective disorder (HCC) 06/29/2015    Past Surgical History:  Procedure Laterality Date   CHOLECYSTECTOMY     COLONOSCOPY WITH PROPOFOL N/A 05/26/2019   Procedure: COLONOSCOPY WITH PROPOFOL;  Surgeon: Toledo, Boykin Nearing, MD;  Location: [facility];  Service: Gastroenterology;  Laterality: N/A;   ESOPHAGOGASTRODUODENOSCOPY (EGD) WITH PROPOFOL N/A 05/26/2019   Procedure: ESOPHAGOGASTRODUODENOSCOPY (EGD) WITH PROPOFOL;  Surgeon: Toledo, Boykin Nearing, MD;  Location: [facility];  Service: Gastroenterology;  Laterality: N/A;   IRRIGATION AND DEBRIDEMENT FOOT Right 02/26/2016   Procedure: RIGHT 2ND TOE DEBRIDEMENT;  Surgeon: Gwyneth Revels, DPM;  Location: [facility];  Service: Podiatry;  Laterality: Right;   IRRIGATION AND DEBRIDEMENT FOOT Left 12/13/2018   Procedure: IRRIGATION AND DEBRIDEMENT FOOT;  Surgeon: Rosetta Posner, DPM;  Location: [facility];  Service: Podiatry;  Laterality: Left;    Family History  Problem Relation Age of Onset   Breast cancer Cousin        maternal cousin   Hypertension Mother    Hyperthyroidism Mother    Dementia Mother    Prostate cancer Father    Diabetes Maternal Grandmother    Hypertension Maternal Grandmother    Cancer Maternal Grandmother        unknown type   Diabetes Maternal Grandfather    Hypertension  Maternal Grandfather    Diabetes Paternal Grandmother    Hypertension Paternal Grandmother    Diabetes Paternal Grandfather    Hypertension Paternal Grandfather     Social History   Socioeconomic History   Marital status: Divorced    Spouse name: Not on file   Number of children: 2   Years of education: Not on file   Highest education level: Not on file  Occupational History   Not on file  Tobacco Use   Smoking status: Every Day    Current packs/day: 0.00    Average packs/day: 1 pack/day for 30.0 years (30.0 ttl pk-yrs)    Types: Cigarettes    Start date: 01/31/1992     Last attempt to quit: 01/30/2022    Years since quitting: 0.9   Smokeless tobacco: Never   Tobacco comments:    Began smoking again in January 2024  Vaping Use   Vaping status: Never Used  Substance and Sexual Activity   Alcohol use: No   Drug use: No   Sexual activity: Not Currently  Other Topics Concern   Not on file  Social History Narrative   At home with mom, 85. Lives in brothers house who moved out. Sister comes by every other day to check on patient and mom.   Social Determinants of Health   Financial Resource Strain: Low Risk  (11/04/2020)   Overall Financial Resource Strain (CARDIA)    Difficulty of Paying Living Expenses: Not hard at all  Food Insecurity: No Food Insecurity (01/16/2022)   Hunger Vital Sign    Worried About Running Out of Food in the Last Year: Never true    Ran Out of Food in the Last Year: Never true  Transportation Needs: No Transportation Needs (01/16/2022)   PRAPARE - Administrator, Civil Service (Medical): No    Lack of Transportation (Non-Medical): No  Physical Activity: Inactive (11/04/2020)   Exercise Vital Sign    Days of Exercise per Week: 0 days    Minutes of Exercise per Session: 0 min  Stress: No Stress Concern Present (11/04/2020)   [employer] of Occupational Health - Occupational Stress Questionnaire    Feeling of Stress : Only a little  Social Connections: Socially Isolated (11/04/2020)   Social Connection and Isolation Panel [NHANES]    Frequency of Communication with Friends and Family: More than three times a week    Frequency of Social Gatherings with Friends and Family: More than three times a week    Attends Religious Services: Never    Database administrator or Organizations: No    Attends Banker Meetings: Never    Marital Status: Divorced  Catering manager Violence: Not At Risk (01/16/2022)   Humiliation, Afraid, Rape, and Kick questionnaire    Fear of Current or Ex-Partner: No    Emotionally  Abused: No    Physically Abused: No    Sexually Abused: No    Outpatient Medications Prior to Visit  Medication Sig Dispense Refill   ACCU-CHEK GUIDE test strip USE TO CHECK BLOOD SUGAR FOUR TIMES DAILY 100 strip 0   Accu-Chek Softclix Lancets lancets USE TO CHECK BLOOD SUGAR FOUR TIMES DAILY 100 each 0   ARIPiprazole (ABILIFY) 15 MG tablet Take 1 tablet (15 mg total) by mouth daily. 30 tablet 2   atorvastatin (LIPITOR) 40 MG tablet Take 1 tablet (40 mg total) by mouth daily. 30 tablet 5   Continuous Blood Gluc Sensor (DEXCOM G7 SENSOR) MISC Please  use it to check blood glucose 3 times before meals, at bedtime and as needed. 3 each 11   furosemide (LASIX) 40 MG tablet Take 1 tablet (40 mg total) by mouth daily. 30 tablet 5   gabapentin (NEURONTIN) 300 MG capsule Take 1 capsule (300 mg total) by mouth at bedtime. 30 capsule 5   guaiFENesin (MUCINEX) 600 MG 12 hr tablet Take 2 tablets (1,200 mg total) by mouth 2 (two) times daily as needed for to loosen phlegm. 30 tablet 0   insulin glargine (LANTUS SOLOSTAR) 100 UNIT/ML Solostar Pen Inject 12 Units into the skin at bedtime. 9 mL 1   losartan-hydrochlorothiazide (HYZAAR) 50-12.5 MG tablet Take 1 tablet by mouth daily. 30 tablet 5   metoprolol succinate (TOPROL-XL) 50 MG 24 hr tablet Take 1 tablet (50 mg total) by mouth daily. Take with or immediately following a meal. 30 tablet 5   Misc. Devices [facility]) MISC Use it to ambulate short distance. 1 each 0   mupirocin ointment (BACTROBAN) 2 % Apply 1 Application topically 2 (two) times daily. 22 g 0   sertraline (ZOLOFT) 25 MG tablet Take 1 tablet (25 mg total) by mouth daily. 30 tablet 2   tirzepatide (MOUNJARO) 5 MG/0.5ML Pen Inject 5 mg into the skin once a week. 6 mL 1   Blood Glucose Monitoring Suppl (ACCU-CHEK GUIDE ME) w/Device KIT USE TO CHECK BLOOD SUGAR FOUR TIMES DAILY 1 kit 0   colestipol (COLESTID) 1 g tablet TAKE (2) TABLETS BY MOUTH TWICE DAILY. (Patient not taking: Reported on  01/12/2023) 120 tablet 0   Ferrous Sulfate 27 MG TABS Take 1 tablet by mouth daily. (Patient not taking: Reported on 12/08/2022)     fluticasone (FLONASE) 50 MCG/ACT nasal spray Place 2 sprays into both nostrils daily. (Patient not taking: Reported on 01/12/2023) 15.8 mL 0   glipiZIDE (GLUCOTROL XL) 5 MG 24 hr tablet Take 1 tablet (5 mg total) by mouth daily with breakfast. 30 tablet 5   pantoprazole (PROTONIX) 40 MG tablet Take 1 tablet (40 mg total) by mouth daily. (Patient not taking: Reported on 01/12/2023) 30 tablet 5   No facility-administered medications prior to visit.    No Known Allergies  ROS Review of Systems  Constitutional:  Negative for chills and fever.  HENT:  Negative for congestion, sinus pressure, sinus pain and sore throat.   Eyes:  Negative for pain and discharge.  Respiratory:  Negative for cough and shortness of breath.   Cardiovascular:  Positive for leg swelling. Negative for chest pain and palpitations.  Gastrointestinal:  Negative for abdominal pain, constipation, nausea and vomiting.  Endocrine: Negative for polydipsia and polyuria.  Genitourinary:  Negative for dysuria and hematuria.  Musculoskeletal:  Positive for arthralgias and gait problem. Negative for neck pain and neck stiffness.  Skin:  Positive for color change (Redness of left leg). Negative for rash.  Neurological:  Negative for dizziness and weakness.  Psychiatric/Behavioral:  Negative for agitation and behavioral problems. The patient is nervous/anxious.       Objective:    Physical Exam Vitals reviewed.  Constitutional:      General: She is not in acute distress.    Appearance: She is obese. She is not diaphoretic.     Comments: Uses walker  HENT:     Head: Normocephalic and atraumatic.     Nose: Nose normal.     Mouth/Throat:     Mouth: Mucous membranes are moist.     Pharynx: No posterior oropharyngeal erythema.  Eyes:     General: No scleral icterus.    Extraocular Movements:  Extraocular movements intact.  Cardiovascular:     Rate and Rhythm: Normal rate and regular rhythm.     Heart sounds: Normal heart sounds. No murmur heard. Pulmonary:     Breath sounds: Normal breath sounds. No wheezing or rales.  Abdominal:     Palpations: Abdomen is soft.     Tenderness: There is no abdominal tenderness.  Musculoskeletal:     Cervical back: Neck supple. No tenderness.     Right lower leg: Edema (1+) present.     Left lower leg: Edema (1+) present.  Skin:    General: Skin is warm.     Findings: Erythema (With mild swelling over left leg-about 3 cm in diameter) present. No rash.  Neurological:     General: No focal deficit present.     Mental Status: She is alert and oriented to person, place, and time.     Sensory: Sensory deficit (B/l LE) present.     Motor: Weakness (3/5 - b/l LE) present.  Psychiatric:        Mood and Affect: Mood normal.        Behavior: Behavior normal.     BP 114/60 (BP Location: Right Arm)   Pulse 74   Ht 5\' 10"  (1.778 m)   Wt 246 lb 9.6 oz (111.9 kg)   SpO2 97%   BMI 35.38 kg/m  Wt Readings from Last 3 Encounters:  01/12/23 246 lb 9.6 oz (111.9 kg)  12/08/22 252 lb 4.8 oz (114.4 kg)  10/10/22 250 lb 6.4 oz (113.6 kg)    Lab Results  Component Value Date   TSH 2.140 11/23/2021   Lab Results  Component Value Date   WBC 11.2 (H) 10/10/2022   HGB 10.5 (L) 10/10/2022   HCT 32.4 (L) 10/10/2022   MCV 86 10/10/2022   PLT 296 10/10/2022   Lab Results  Component Value Date   NA 139 10/10/2022   K 4.6 10/10/2022   CO2 20 10/10/2022   GLUCOSE 115 (H) 10/10/2022   BUN 38 (H) 10/10/2022   CREATININE 1.88 (H) 10/10/2022   BILITOT <0.2 10/10/2022   ALKPHOS 159 (H) 10/10/2022   AST 11 10/10/2022   ALT 13 10/10/2022   PROT 6.7 10/10/2022   ALBUMIN 3.9 10/10/2022   CALCIUM 9.4 10/10/2022   ANIONGAP 6 01/18/2022   EGFR 31 (L) 10/10/2022   Lab Results  Component Value Date   CHOL 194 10/10/2022   Lab Results  Component  Value Date   HDL 33 (L) 10/10/2022   Lab Results  Component Value Date   LDLCALC 109 (H) 10/10/2022   Lab Results  Component Value Date   TRIG 302 (H) 10/10/2022   Lab Results  Component Value Date   CHOLHDL 5.9 (H) 10/10/2022   Lab Results  Component Value Date   HGBA1C 7.1 (H) 10/10/2022      Assessment & Plan:   Problem List Items Addressed This Visit       Cardiovascular and Mediastinum   Essential hypertension (Chronic)    BP Readings from Last 1 Encounters:  01/12/23 114/60   Well-controlled with Losartan-HCTZ and Metoprolol Also takes Lasix for LE swelling, advised to take Lasix 40 mg daily only for now Counseled for compliance with the medications Advised DASH diet and moderate exercise/walking, at least 150 mins/week        Digestive   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HCC)  status post partial colectomy in January 2021 Needs repeat colonoscopy Referred to GI and Oncology      Relevant Medications   doxycycline (VIBRA-TABS) 100 MG tablet     Endocrine   Type 2 diabetes mellitus with neurological complications (HCC)    Lab Results  Component Value Date   HGBA1C 7.1 (H) 10/10/2022   Uncontrolled currently, better than prior On Lantus 12 U QD, held Metformin 1000 mg BID due to GFR < 30 On glipizide 5 mg daily On Mounjaro, increase dose as tolerated - 5 mg qw now  Check CMP and HbA1c  Advised to follow diabetic diet On statin and ARB F/u CMP and lipid panel Diabetic eye exam: Advised to follow up with Ophthalmology for diabetic eye exam      Relevant Medications   Blood Glucose Monitoring Suppl (ACCU-CHEK GUIDE ME) w/Device KIT   Other Relevant Orders   CMP14+EGFR   Hemoglobin A1c   Hyperlipidemia associated with type 2 diabetes mellitus (HCC)    On Lipitor Check lipid profile      Relevant Orders   Lipid Profile     Genitourinary   CKD (chronic kidney disease) stage 4, GFR 15-29 ml/min (HCC)    AKI on CKD recently Likely due to  dehydration Improved with gentle hydration Recently held metformin and additional 20 mg dose of Lasix Check CMP      Relevant Orders   CBC with Differential/Platelet   CMP14+EGFR     Other   Cellulitis of left lower extremity    Left leg erythema and swelling likely due to cellulitis Started doxycycline for MRSA coverage Keep area clean and dry      Relevant Medications   doxycycline (VIBRA-TABS) 100 MG tablet   Schizoaffective disorder (HCC)    On Abilify and Zoloft Better controlled Followed by psychiatry      Encounter for general adult medical examination with abnormal findings - Primary    Physical exam as documented. Counseling done  re healthy lifestyle involving commitment to 150 minutes exercise per week, heart healthy diet, and attaining healthy weight.The importance of adequate sleep also discussed. Changes in health habits are decided on by the patient with goals and time frames  set for achieving them. Immunization and cancer screening needs are specifically addressed at this visit.      Tobacco abuse    Smokes about 0.5 pack/day  Asked about quitting: confirms that he/she currently smokes cigarettes Advise to quit smoking: Educated about QUITTING to reduce the risk of cancer, cardio and cerebrovascular disease. Assess willingness: Unwilling to quit at this time, but is working on cutting back. Assist with counseling and pharmacotherapy: Counseled for 5 minutes and literature provided. Arrange for follow up: follow up in 3 months and continue to offer help.      Other Visit Diagnoses     Encounter for immunization       Relevant Orders   Flu vaccine trivalent PF, 6mos and older(Flulaval,Afluria,Fluarix,Fluzone) (Completed)       Meds ordered this encounter  Medications   Blood Glucose Monitoring Suppl (ACCU-CHEK GUIDE ME) w/Device KIT    Sig: USE TO CHECK BLOOD SUGAR FOUR TIMES DAILY    Dispense:  1 kit    Refill:  0    Order Specific Question:    Number of strips    Answer:   100    Order Specific Question:   Number of lancets    Answer:   100   doxycycline (VIBRA-TABS) 100 MG  tablet    Sig: Take 1 tablet (100 mg total) by mouth 2 (two) times daily.    Dispense:  14 tablet    Refill:  0    Follow-up: Return in about 4 months (around 05/14/2023) for DM and HTN.    Anabel Halon, MD

## 2023-01-12 NOTE — Assessment & Plan Note (Signed)
Smokes about 0.5 pack/day  Asked about quitting: confirms that he/she currently smokes cigarettes Advise to quit smoking: Educated about QUITTING to reduce the risk of cancer, cardio and cerebrovascular disease. Assess willingness: Unwilling to quit at this time, but is working on cutting back. Assist with counseling and pharmacotherapy: Counseled for 5 minutes and literature provided. Arrange for follow up: follow up in 3 months and continue to offer help. 

## 2023-01-12 NOTE — Assessment & Plan Note (Signed)
On Lipitor Check lipid profile 

## 2023-01-12 NOTE — Assessment & Plan Note (Signed)
Left leg erythema and swelling likely due to cellulitis Started doxycycline for MRSA coverage Keep area clean and dry

## 2023-01-12 NOTE — Patient Instructions (Addendum)
Schedule your Medicare Annual Wellness Visit at checkout.  Please start taking Doxycyline for leg cellulitis.  Please continue to take medications as prescribed.  Please continue to follow low carb diet and perform moderate exercise/walking at least 150 mins/week.  Please call 1-800-QUIT-NOW for smoking cessation supplies.

## 2023-01-12 NOTE — Assessment & Plan Note (Signed)
AKI on CKD recently Likely due to dehydration Improved with gentle hydration Recently held metformin and additional 20 mg dose of Lasix Check CMP

## 2023-01-12 NOTE — Assessment & Plan Note (Signed)

## 2023-01-12 NOTE — Assessment & Plan Note (Signed)
status post partial colectomy in January 2021 Needs repeat colonoscopy Referred to GI and Oncology

## 2023-01-12 NOTE — Assessment & Plan Note (Signed)
Lab Results  Component Value Date   HGBA1C 7.1 (H) 10/10/2022   Uncontrolled currently, better than prior On Lantus 12 U QD, held Metformin 1000 mg BID due to GFR < 30 On glipizide 5 mg daily On Mounjaro, increase dose as tolerated - 5 mg qw now  Check CMP and HbA1c  Advised to follow diabetic diet On statin and ARB F/u CMP and lipid panel Diabetic eye exam: Advised to follow up with Ophthalmology for diabetic eye exam

## 2023-01-12 NOTE — Assessment & Plan Note (Signed)
On Abilify and Zoloft Better controlled Followed by psychiatry

## 2023-01-12 NOTE — Assessment & Plan Note (Signed)
BP Readings from Last 1 Encounters:  01/12/23 114/60   Well-controlled with Losartan-HCTZ and Metoprolol Also takes Lasix for LE swelling, advised to take Lasix 40 mg daily only for now Counseled for compliance with the medications Advised DASH diet and moderate exercise/walking, at least 150 mins/week

## 2023-01-13 LAB — CBC WITH DIFFERENTIAL/PLATELET
Basophils Absolute: 0.1 10*3/uL (ref 0.0–0.2)
Basos: 1 %
EOS (ABSOLUTE): 0.3 10*3/uL (ref 0.0–0.4)
Eos: 2 %
Hematocrit: 31.9 % — ABNORMAL LOW (ref 34.0–46.6)
Hemoglobin: 10 g/dL — ABNORMAL LOW (ref 11.1–15.9)
Immature Grans (Abs): 0.2 10*3/uL — ABNORMAL HIGH (ref 0.0–0.1)
Immature Granulocytes: 1 %
Lymphocytes Absolute: 3.7 10*3/uL — ABNORMAL HIGH (ref 0.7–3.1)
Lymphs: 31 %
MCH: 28.2 pg (ref 26.6–33.0)
MCHC: 31.3 g/dL — ABNORMAL LOW (ref 31.5–35.7)
MCV: 90 fL (ref 79–97)
Monocytes Absolute: 0.7 10*3/uL (ref 0.1–0.9)
Monocytes: 6 %
Neutrophils Absolute: 7.1 10*3/uL — ABNORMAL HIGH (ref 1.4–7.0)
Neutrophils: 59 %
Platelets: 310 10*3/uL (ref 150–450)
RBC: 3.54 x10E6/uL — ABNORMAL LOW (ref 3.77–5.28)
RDW: 15.4 % (ref 11.7–15.4)
WBC: 12.1 10*3/uL — ABNORMAL HIGH (ref 3.4–10.8)

## 2023-01-13 LAB — LIPID PANEL
Chol/HDL Ratio: 5.7 ratio — ABNORMAL HIGH (ref 0.0–4.4)
Cholesterol, Total: 155 mg/dL (ref 100–199)
HDL: 27 mg/dL — ABNORMAL LOW (ref >39)
LDL Chol Calc (NIH): 73 mg/dL (ref 0–99)
Triglycerides: 342 mg/dL — ABNORMAL HIGH (ref 0–149)
VLDL Cholesterol Cal: 55 mg/dL — ABNORMAL HIGH (ref 5–40)

## 2023-01-13 LAB — CMP14+EGFR
ALT: 12 IU/L (ref 0–32)
AST: 11 IU/L (ref 0–40)
Albumin: 3.9 g/dL (ref 3.8–4.9)
Alkaline Phosphatase: 148 IU/L — ABNORMAL HIGH (ref 44–121)
BUN/Creatinine Ratio: 18 (ref 9–23)
BUN: 33 mg/dL — ABNORMAL HIGH (ref 6–24)
Bilirubin Total: <0.2 mg/dL (ref 0.0–1.2)
CO2: 19 mmol/L — ABNORMAL LOW (ref 20–29)
Calcium: 9 mg/dL (ref 8.7–10.2)
Chloride: 104 mmol/L (ref 96–106)
Creatinine, Ser: 1.8 mg/dL — ABNORMAL HIGH (ref 0.57–1.00)
Globulin, Total: 2.6 g/dL (ref 1.5–4.5)
Glucose: 69 mg/dL — ABNORMAL LOW (ref 70–99)
Potassium: 4.7 mmol/L (ref 3.5–5.2)
Sodium: 139 mmol/L (ref 134–144)
Total Protein: 6.5 g/dL (ref 6.0–8.5)
eGFR: 32 mL/min/{1.73_m2} — ABNORMAL LOW (ref >59)

## 2023-01-13 LAB — HEMOGLOBIN A1C
Est. average glucose Bld gHb Est-mCnc: 148 mg/dL
Hgb A1c MFr Bld: 6.8 % — ABNORMAL HIGH (ref 4.8–5.6)

## 2023-01-18 ENCOUNTER — Ambulatory Visit (HOSPITAL_COMMUNITY): Admission: RE | Admit: 2023-01-18 | Payer: Medicare HMO | Source: Home / Self Care | Admitting: Gastroenterology

## 2023-01-18 ENCOUNTER — Encounter (HOSPITAL_COMMUNITY): Admission: RE | Payer: Self-pay | Source: Home / Self Care

## 2023-01-18 SURGERY — ESOPHAGOGASTRODUODENOSCOPY (EGD) WITH PROPOFOL
Anesthesia: Monitor Anesthesia Care

## 2023-01-25 ENCOUNTER — Encounter: Payer: Self-pay | Admitting: Oncology

## 2023-02-12 ENCOUNTER — Encounter: Payer: Self-pay | Admitting: Emergency Medicine

## 2023-02-12 ENCOUNTER — Ambulatory Visit
Admission: EM | Admit: 2023-02-12 | Discharge: 2023-02-12 | Disposition: A | Discharge status: Home / Self Care | Payer: Medicare HMO | Attending: Family Medicine | Admitting: Family Medicine

## 2023-02-12 DIAGNOSIS — Z1152 Encounter for screening for COVID-19: Secondary | ICD-10-CM | POA: Diagnosis not present

## 2023-02-12 DIAGNOSIS — J069 Acute upper respiratory infection, unspecified: Secondary | ICD-10-CM | POA: Diagnosis not present

## 2023-02-12 NOTE — ED Provider Notes (Signed)
[facility]    CSN: [id number] Arrival date & time: 02/12/23  1324      History   Chief Complaint No chief complaint on file.   HPI [name] is a 57 y.o. female.   Patient presenting today with 2-day history of sore throat, headache.  Denies fever, chills, cough, congestion, chest pain, shortness of breath.  So far not tried anything over-the-counter for symptoms.  Does have a recent sick contact and wanting to be tested for COVID.    Past Medical History:  Diagnosis Date   Anemia    Charcot's joint of foot, left    Depression    Diabetes mellitus without complication (HCC)    Hyperlipidemia    Hypertension    Schizoaffective disorder (HCC) 06/29/2015    Patient Active Problem List   Diagnosis Date Noted   Diabetic polyneuropathy associated with type 2 diabetes mellitus (HCC) 10/10/2022   CKD (chronic kidney disease) stage 4, GFR 15-29 ml/min (HCC) 06/20/2022   Generalized anxiety disorder 05/26/2022   Vitamin D deficiency 04/12/2022   Insomnia 04/12/2022   Polypharmacy 04/12/2022   Venous ulcer of left leg (HCC) 02/18/2022   Peripheral edema 01/27/2022   Physical deconditioning 01/27/2022   Hyperkalemia 01/16/2022   Tobacco abuse 01/16/2022   AKI (acute kidney injury) (HCC) 01/15/2022   Encounter for general adult medical examination with abnormal findings 11/23/2021   GERD (gastroesophageal reflux disease) 10/21/2020   Lactose intolerance 08/03/2020   Charcot's joint of foot, left    Schizoaffective disorder (HCC) 02/20/2020   Chronic diarrhea 11/25/2019   History of colon cancer 11/25/2019   Iron deficiency anemia 07/25/2019   S/P partial colectomy 07/17/2019   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HCC) 05/28/2019   Cellulitis of left lower extremity 10/18/2018   Morbid obesity (HCC) 05/10/2017   Long-term use of high-risk medication 05/10/2017   Hyperlipidemia associated with type 2 diabetes mellitus (HCC) 06/27/2016   Hammer toe of  second toe of right foot 04/20/2016   Essential hypertension 06/29/2015   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   Polyneuropathy 04/02/2014    Past Surgical History:  Procedure Laterality Date   CHOLECYSTECTOMY     COLONOSCOPY WITH PROPOFOL N/A 05/26/2019   Procedure: COLONOSCOPY WITH PROPOFOL;  Surgeon: Toledo, Boykin Nearing, MD;  Location: [facility];  Service: Gastroenterology;  Laterality: N/A;   ESOPHAGOGASTRODUODENOSCOPY (EGD) WITH PROPOFOL N/A 05/26/2019   Procedure: ESOPHAGOGASTRODUODENOSCOPY (EGD) WITH PROPOFOL;  Surgeon: Toledo, Boykin Nearing, MD;  Location: [facility];  Service: Gastroenterology;  Laterality: N/A;   IRRIGATION AND DEBRIDEMENT FOOT Right 02/26/2016   Procedure: RIGHT 2ND TOE DEBRIDEMENT;  Surgeon: Gwyneth Revels, DPM;  Location: [facility];  Service: Podiatry;  Laterality: Right;   IRRIGATION AND DEBRIDEMENT FOOT Left 12/13/2018   Procedure: IRRIGATION AND DEBRIDEMENT FOOT;  Surgeon: Rosetta Posner, DPM;  Location: [facility];  Service: Podiatry;  Laterality: Left;    OB History     Gravida  2   Para  2   Term  2   Preterm      AB      Living  2      SAB      IAB      Ectopic      Multiple      Live Births               Home Medications    Prior to Admission medications   Medication Sig Start Date End Date Taking? Authorizing  Provider  ACCU-CHEK GUIDE test strip USE TO CHECK BLOOD SUGAR FOUR TIMES DAILY 09/13/22   Anabel Halon, MD  Accu-Chek Softclix Lancets lancets USE TO CHECK BLOOD SUGAR FOUR TIMES DAILY 09/13/22   Anabel Halon, MD  ARIPiprazole (ABILIFY) 15 MG tablet Take 1 tablet (15 mg total) by mouth daily. 12/13/22   Elsie Lincoln, MD  atorvastatin (LIPITOR) 40 MG tablet Take 1 tablet (40 mg total) by mouth daily. 10/10/22   Anabel Halon, MD  Blood Glucose Monitoring Suppl (ACCU-CHEK GUIDE ME) w/Device KIT USE TO CHECK BLOOD SUGAR FOUR TIMES DAILY 01/12/23   Anabel Halon, MD   colestipol (COLESTID) 1 g tablet TAKE (2) TABLETS BY MOUTH TWICE DAILY. Patient not taking: Reported on 01/12/2023 12/08/22   Raquel James, NP  Continuous Blood Gluc Sensor (DEXCOM G7 SENSOR) MISC Please use it to check blood glucose 3 times before meals, at bedtime and as needed. 11/23/21   Anabel Halon, MD  Ferrous Sulfate 27 MG TABS Take 1 tablet by mouth daily. Patient not taking: Reported on 12/08/2022    [provider]  fluticasone (FLONASE) 50 MCG/ACT nasal spray Place 2 sprays into both nostrils daily. Patient not taking: Reported on 01/12/2023 09/28/22   Gardenia Phlegm, MD  furosemide (LASIX) 40 MG tablet Take 1 tablet (40 mg total) by mouth daily. 10/10/22   Anabel Halon, MD  gabapentin (NEURONTIN) 300 MG capsule Take 1 capsule (300 mg total) by mouth at bedtime. 10/10/22   Anabel Halon, MD  glipiZIDE (GLUCOTROL XL) 5 MG 24 hr tablet Take 1 tablet (5 mg total) by mouth daily with breakfast. 10/10/22   Anabel Halon, MD  guaiFENesin (MUCINEX) 600 MG 12 hr tablet Take 2 tablets (1,200 mg total) by mouth 2 (two) times daily as needed for to loosen phlegm. 09/28/22   Gardenia Phlegm, MD  insulin glargine (LANTUS SOLOSTAR) 100 UNIT/ML Solostar Pen Inject 12 Units into the skin at bedtime. 10/10/22   Anabel Halon, MD  losartan-hydrochlorothiazide (HYZAAR) 50-12.5 MG tablet Take 1 tablet by mouth daily. 10/10/22   Anabel Halon, MD  metoprolol succinate (TOPROL-XL) 50 MG 24 hr tablet Take 1 tablet (50 mg total) by mouth daily. Take with or immediately following a meal. 10/10/22   Anabel Halon, MD  Misc. Devices [facility]) MISC Use it to ambulate short distance. 05/30/22   Anabel Halon, MD  mupirocin ointment (BACTROBAN) 2 % Apply 1 Application topically 2 (two) times daily. 02/18/22   Anabel Halon, MD  pantoprazole (PROTONIX) 40 MG tablet Take 1 tablet (40 mg total) by mouth daily. Patient not taking: Reported on 01/12/2023 10/10/22   Anabel Halon, MD  sertraline  (ZOLOFT) 25 MG tablet Take 1 tablet (25 mg total) by mouth daily. 12/13/22 03/13/23  Elsie Lincoln, MD  tirzepatide [facility]) 5 MG/0.5ML Pen Inject 5 mg into the skin once a week. 10/10/22   Anabel Halon, MD    Family History Family History  Problem Relation Age of Onset   Breast cancer Cousin        maternal cousin   Hypertension Mother    Hyperthyroidism Mother    Dementia Mother    Prostate cancer Father    Diabetes Maternal Grandmother    Hypertension Maternal Grandmother    Cancer Maternal Grandmother        unknown type   Diabetes Maternal Grandfather    Hypertension Maternal Grandfather  Diabetes Paternal Grandmother    Hypertension Paternal Grandmother    Diabetes Paternal Grandfather    Hypertension Paternal Grandfather     Social History Social History   Tobacco Use   Smoking status: Every Day    Current packs/day: 0.00    Average packs/day: 1 pack/day for 30.0 years (30.0 ttl pk-yrs)    Types: Cigarettes    Start date: 01/31/1992    Last attempt to quit: 01/30/2022    Years since quitting: 1.0   Smokeless tobacco: Never   Tobacco comments:    Began smoking again in January 2024  Vaping Use   Vaping status: Never Used  Substance Use Topics   Alcohol use: No   Drug use: No     Allergies   Patient has no known allergies.   Review of Systems Review of Systems Per HPI  Physical Exam Triage Vital Signs ED Triage Vitals  Encounter Vitals Group     BP 02/12/23 1340 107/68     Systolic BP Percentile --      Diastolic BP Percentile --      Pulse Rate 02/12/23 1340 84     Resp 02/12/23 1340 18     Temp 02/12/23 1340 98.2 F (36.8 C)     Temp Source 02/12/23 1340 Oral     SpO2 02/12/23 1340 94 %     Weight --      Height --      Head Circumference --      Peak Flow --      Pain Score 02/12/23 1341 4     Pain Loc --      Pain Education --      Exclude from Growth Chart --    No data found.  Updated Vital Signs BP 107/68 (BP Location:  Right Arm)   Pulse 84   Temp 98.2 F (36.8 C) (Oral)   Resp 18   SpO2 94%   Visual Acuity Right Eye Distance:   Left Eye Distance:   Bilateral Distance:    Right Eye Near:   Left Eye Near:    Bilateral Near:     Physical Exam Vitals and nursing note reviewed.  Constitutional:      Appearance: Normal appearance. She is not ill-appearing.  HENT:     Head: Atraumatic.     Nose: Rhinorrhea present.     Mouth/Throat:     Pharynx: Posterior oropharyngeal erythema present.  Eyes:     Extraocular Movements: Extraocular movements intact.     Conjunctiva/sclera: Conjunctivae normal.  Cardiovascular:     Rate and Rhythm: Normal rate and regular rhythm.     Heart sounds: Normal heart sounds.  Pulmonary:     Effort: Pulmonary effort is normal.     Breath sounds: Normal breath sounds. No wheezing or rales.  Musculoskeletal:        General: Normal range of motion.     Cervical back: Normal range of motion and neck supple.  Skin:    General: Skin is warm and dry.  Neurological:     Mental Status: She is alert and oriented to person, place, and time.  Psychiatric:        Mood and Affect: Mood normal.        Thought Content: Thought content normal.        Judgment: Judgment normal.      UC Treatments / Results  Labs (all labs ordered are listed, but only abnormal results are displayed) Labs Reviewed  SARS CORONAVIRUS 2 (TAT 6-24 HRS)    EKG   Radiology No results found.  Procedures Procedures (including critical care time)  Medications Ordered in UC Medications - No data to display  Initial Impression / Assessment and Plan / UC Course  I have reviewed the triage vital signs and the nursing notes.  Pertinent labs & imaging results that were available during my care of the patient were reviewed by me and considered in my medical decision making (see chart for details).     Suspect viral upper respiratory infection, vitals and exam reassuring today, COVID testing  pending.  Supportive over-the-counter medications, home care reviewed.  Return for worsening symptoms. Final Clinical Impressions(s) / UC Diagnoses   Final diagnoses:  Encounter for screening for COVID-19  Viral URI     Discharge Instructions      Your COVID results should be back tomorrow.  Someone will call if positive.    ED Prescriptions   None    PDMP not reviewed this encounter.   Particia Nearing, [place] 02/12/23 1458

## 2023-02-12 NOTE — ED Triage Notes (Signed)
Sore throat, headache x 2 days.  Wants to be covid tested.

## 2023-02-12 NOTE — Discharge Instructions (Signed)
Your COVID results should be back tomorrow.  Someone will call if positive.

## 2023-02-13 ENCOUNTER — Encounter (HOSPITAL_COMMUNITY): Payer: Self-pay | Admitting: Psychiatry

## 2023-02-13 ENCOUNTER — Telehealth (INDEPENDENT_AMBULATORY_CARE_PROVIDER_SITE_OTHER): Payer: Medicare HMO | Admitting: Psychiatry

## 2023-02-13 DIAGNOSIS — F411 Generalized anxiety disorder: Secondary | ICD-10-CM | POA: Diagnosis not present

## 2023-02-13 DIAGNOSIS — Z79899 Other long term (current) drug therapy: Secondary | ICD-10-CM | POA: Diagnosis not present

## 2023-02-13 DIAGNOSIS — F2089 Other schizophrenia: Secondary | ICD-10-CM | POA: Diagnosis not present

## 2023-02-13 DIAGNOSIS — Z720 Tobacco use: Secondary | ICD-10-CM | POA: Diagnosis not present

## 2023-02-13 DIAGNOSIS — F99 Mental disorder, not otherwise specified: Secondary | ICD-10-CM | POA: Diagnosis not present

## 2023-02-13 DIAGNOSIS — F5105 Insomnia due to other mental disorder: Secondary | ICD-10-CM | POA: Diagnosis not present

## 2023-02-13 LAB — SARS CORONAVIRUS 2 (TAT 6-24 HRS): SARS Coronavirus 2: NEGATIVE

## 2023-02-13 MED ORDER — ARIPIPRAZOLE 15 MG PO TABS
15.0000 mg | ORAL_TABLET | Freq: Every day | ORAL | 2 refills | Status: DC
Start: 2023-02-13 — End: 2023-04-17

## 2023-02-13 MED ORDER — SERTRALINE HCL 25 MG PO TABS
25.0000 mg | ORAL_TABLET | Freq: Every day | ORAL | 2 refills | Status: DC
Start: 2023-02-13 — End: 2023-04-17

## 2023-02-13 NOTE — Patient Instructions (Signed)
We did not make any medication changes today.  I will message her PCP about getting an EKG to check your QTc interval.  Happy early birthday.
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
## 2023-03-08 DIAGNOSIS — M6281 Muscle weakness (generalized): Secondary | ICD-10-CM | POA: Diagnosis not present

## 2023-03-08 DIAGNOSIS — R2689 Other abnormalities of gait and mobility: Secondary | ICD-10-CM | POA: Diagnosis not present

## 2023-03-15 ENCOUNTER — Encounter: Payer: Self-pay | Admitting: Oncology

## 2023-03-17 DIAGNOSIS — M6281 Muscle weakness (generalized): Secondary | ICD-10-CM | POA: Diagnosis not present

## 2023-03-17 DIAGNOSIS — R2689 Other abnormalities of gait and mobility: Secondary | ICD-10-CM | POA: Diagnosis not present

## 2023-03-24 DIAGNOSIS — R2689 Other abnormalities of gait and mobility: Secondary | ICD-10-CM | POA: Diagnosis not present

## 2023-03-24 DIAGNOSIS — M6281 Muscle weakness (generalized): Secondary | ICD-10-CM | POA: Diagnosis not present

## 2023-03-27 DIAGNOSIS — M6281 Muscle weakness (generalized): Secondary | ICD-10-CM | POA: Diagnosis not present

## 2023-03-27 DIAGNOSIS — R2689 Other abnormalities of gait and mobility: Secondary | ICD-10-CM | POA: Diagnosis not present

## 2023-04-03 ENCOUNTER — Encounter: Payer: Self-pay | Admitting: Oncology

## 2023-04-09 ENCOUNTER — Other Ambulatory Visit: Payer: Self-pay | Admitting: Internal Medicine

## 2023-04-09 DIAGNOSIS — E1149 Type 2 diabetes mellitus with other diabetic neurological complication: Secondary | ICD-10-CM

## 2023-04-10 ENCOUNTER — Ambulatory Visit (INDEPENDENT_AMBULATORY_CARE_PROVIDER_SITE_OTHER): Payer: Medicare HMO | Admitting: Gastroenterology

## 2023-04-14 DIAGNOSIS — M6281 Muscle weakness (generalized): Secondary | ICD-10-CM | POA: Diagnosis not present

## 2023-04-14 DIAGNOSIS — R2689 Other abnormalities of gait and mobility: Secondary | ICD-10-CM | POA: Diagnosis not present

## 2023-04-16 ENCOUNTER — Other Ambulatory Visit: Payer: Self-pay | Admitting: Internal Medicine

## 2023-04-16 DIAGNOSIS — I10 Essential (primary) hypertension: Secondary | ICD-10-CM

## 2023-04-16 DIAGNOSIS — R600 Localized edema: Secondary | ICD-10-CM

## 2023-04-16 DIAGNOSIS — E1149 Type 2 diabetes mellitus with other diabetic neurological complication: Secondary | ICD-10-CM

## 2023-04-16 DIAGNOSIS — E1142 Type 2 diabetes mellitus with diabetic polyneuropathy: Secondary | ICD-10-CM

## 2023-04-16 DIAGNOSIS — E1169 Type 2 diabetes mellitus with other specified complication: Secondary | ICD-10-CM

## 2023-04-17 ENCOUNTER — Encounter (HOSPITAL_COMMUNITY): Payer: Self-pay | Admitting: Psychiatry

## 2023-04-17 ENCOUNTER — Telehealth (HOSPITAL_COMMUNITY): Payer: Medicare HMO | Admitting: Psychiatry

## 2023-04-17 DIAGNOSIS — F2089 Other schizophrenia: Secondary | ICD-10-CM

## 2023-04-17 DIAGNOSIS — F411 Generalized anxiety disorder: Secondary | ICD-10-CM | POA: Diagnosis not present

## 2023-04-17 DIAGNOSIS — Z79899 Other long term (current) drug therapy: Secondary | ICD-10-CM

## 2023-04-17 DIAGNOSIS — Z720 Tobacco use: Secondary | ICD-10-CM

## 2023-04-17 DIAGNOSIS — F5105 Insomnia due to other mental disorder: Secondary | ICD-10-CM

## 2023-04-17 MED ORDER — SERTRALINE HCL 50 MG PO TABS
50.0000 mg | ORAL_TABLET | Freq: Every day | ORAL | 2 refills | Status: DC
Start: 2023-04-17 — End: 2023-06-06

## 2023-04-17 MED ORDER — ARIPIPRAZOLE 15 MG PO TABS
15.0000 mg | ORAL_TABLET | Freq: Every day | ORAL | 2 refills | Status: DC
Start: 2023-04-17 — End: 2023-05-15
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
## 2023-04-17 NOTE — Patient Instructions (Signed)
We increased the Zoloft (sertraline) to 50 mg once daily today.  This should appear in your next bubble pack.  I messaged your PCP about getting an updated EKG when you see them in January.

## 2023-05-04 ENCOUNTER — Encounter: Payer: Self-pay | Admitting: Oncology

## 2023-05-11 ENCOUNTER — Telehealth (HOSPITAL_COMMUNITY): Payer: 59 | Admitting: Psychiatry

## 2023-05-14 ENCOUNTER — Other Ambulatory Visit (HOSPITAL_COMMUNITY): Payer: Self-pay | Admitting: Psychiatry

## 2023-05-14 DIAGNOSIS — F2089 Other schizophrenia: Secondary | ICD-10-CM

## 2023-05-15 ENCOUNTER — Ambulatory Visit: Payer: Medicare HMO | Admitting: Internal Medicine

## 2023-05-23 ENCOUNTER — Telehealth: Payer: Self-pay | Admitting: Internal Medicine

## 2023-05-23 NOTE — Telephone Encounter (Signed)
Awaiting response from provider.

## 2023-05-23 NOTE — Telephone Encounter (Signed)
Please see note below.   Copied from CRM [id number]. Topic: Clinical - Prescription Issue >> May 23, 2023  1:43 PM Antony Haste wrote: Reason for CRM: This patient states the sertraline (ZOLOFT) 50 MG tablet is not working, and she is wanting to know if she can be prescribed buPROPion (WELLBUTRIN XL) 150 MG 24 hr tablet with [facility] again? Callback #[phone]

## 2023-05-23 NOTE — Telephone Encounter (Signed)
Tried calling pt back vm box is full.

## 2023-05-23 NOTE — Telephone Encounter (Signed)
Duplicate request. Patient updated contact information in note below.  Copied from CRM [id number]. Topic: Clinical - Prescription Issue >> May 23, 2023  1:59 PM Ivette P wrote: Reason for CRM: This patient states the sertraline (ZOLOFT) 50 MG tablet is not working, and she is wanting to know if she can be prescribed buPROPion (WELLBUTRIN XL) 150 MG 24 hr tablet with [facility], Pt is requesting a callback at this number [phone], her phone number is not working at this time.

## 2023-05-24 NOTE — Telephone Encounter (Signed)
Second attempt

## 2023-06-03 ENCOUNTER — Encounter: Payer: Self-pay | Admitting: Oncology

## 2023-06-06 ENCOUNTER — Telehealth (HOSPITAL_COMMUNITY): Payer: Medicare HMO | Admitting: Psychiatry

## 2023-06-06 ENCOUNTER — Encounter (HOSPITAL_COMMUNITY): Payer: Self-pay | Admitting: Psychiatry

## 2023-06-06 DIAGNOSIS — F411 Generalized anxiety disorder: Secondary | ICD-10-CM

## 2023-06-06 DIAGNOSIS — E559 Vitamin D deficiency, unspecified: Secondary | ICD-10-CM

## 2023-06-06 DIAGNOSIS — F17210 Nicotine dependence, cigarettes, uncomplicated: Secondary | ICD-10-CM

## 2023-06-06 DIAGNOSIS — N184 Chronic kidney disease, stage 4 (severe): Secondary | ICD-10-CM | POA: Diagnosis not present

## 2023-06-06 DIAGNOSIS — F99 Mental disorder, not otherwise specified: Secondary | ICD-10-CM | POA: Diagnosis not present

## 2023-06-06 DIAGNOSIS — Z79899 Other long term (current) drug therapy: Secondary | ICD-10-CM | POA: Diagnosis not present

## 2023-06-06 DIAGNOSIS — F251 Schizoaffective disorder, depressive type: Secondary | ICD-10-CM

## 2023-06-06 DIAGNOSIS — F5105 Insomnia due to other mental disorder: Secondary | ICD-10-CM | POA: Diagnosis not present

## 2023-06-06 DIAGNOSIS — Z720 Tobacco use: Secondary | ICD-10-CM

## 2023-06-06 MED ORDER — ARIPIPRAZOLE 20 MG PO TABS
20.0000 mg | ORAL_TABLET | Freq: Every day | ORAL | 2 refills | Status: DC
Start: 2023-06-06 — End: 2023-09-11

## 2023-06-06 MED ORDER — SERTRALINE HCL 25 MG PO TABS
25.0000 mg | ORAL_TABLET | Freq: Every day | ORAL | 2 refills | Status: DC
Start: 2023-06-06 — End: 2023-11-08
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
## 2023-06-06 NOTE — Patient Instructions (Signed)
 We decreased the Zoloft  back to 25 mg once daily today.  We also increased the Abilify  to 20 mg once daily today to address the hallucinations that are coming up.  Hopefully this will also address your depressed mood.  Please coordinate with your PCP to get an updated EKG, vitamin D , B12, folate, iron panel.

## 2023-06-12 ENCOUNTER — Encounter: Payer: Self-pay | Admitting: Oncology

## 2023-06-12 ENCOUNTER — Telehealth (HOSPITAL_COMMUNITY): Payer: Medicare HMO | Admitting: Psychiatry

## 2023-06-14 ENCOUNTER — Ambulatory Visit: Payer: Self-pay | Admitting: Family Medicine

## 2023-07-04 ENCOUNTER — Telehealth (HOSPITAL_COMMUNITY): Payer: Medicare HMO | Admitting: Psychiatry

## 2023-07-10 ENCOUNTER — Telehealth (INDEPENDENT_AMBULATORY_CARE_PROVIDER_SITE_OTHER): Admitting: Psychiatry

## 2023-07-10 ENCOUNTER — Telehealth: Payer: Self-pay

## 2023-07-10 ENCOUNTER — Encounter (HOSPITAL_COMMUNITY): Payer: Self-pay | Admitting: Psychiatry

## 2023-07-10 DIAGNOSIS — F251 Schizoaffective disorder, depressive type: Secondary | ICD-10-CM

## 2023-07-10 DIAGNOSIS — Z87891 Personal history of nicotine dependence: Secondary | ICD-10-CM | POA: Diagnosis not present

## 2023-07-10 DIAGNOSIS — F411 Generalized anxiety disorder: Secondary | ICD-10-CM

## 2023-07-10 DIAGNOSIS — F5105 Insomnia due to other mental disorder: Secondary | ICD-10-CM | POA: Diagnosis not present

## 2023-07-10 DIAGNOSIS — Z79899 Other long term (current) drug therapy: Secondary | ICD-10-CM | POA: Diagnosis not present

## 2023-07-10 DIAGNOSIS — F99 Mental disorder, not otherwise specified: Secondary | ICD-10-CM

## 2023-07-10 NOTE — Telephone Encounter (Signed)
 Copied from CRM [id number]. Topic: General - Other >> Jul 10, 2023  9:57 AM Emylou G wrote: Reason for CRM: Patient adv if okay to squeeze and note would like to see if can get EKG or have her heart checked.

## 2023-07-10 NOTE — Patient Instructions (Signed)
 We did not make any medication changes today. Please make sure your PCP sends me the results of your EKG and kidney function tests.
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
## 2023-07-13 ENCOUNTER — Ambulatory Visit: Payer: Self-pay | Admitting: Internal Medicine

## 2023-07-13 ENCOUNTER — Other Ambulatory Visit: Payer: Self-pay | Admitting: Internal Medicine

## 2023-07-14 ENCOUNTER — Telehealth: Payer: Self-pay | Admitting: Pharmacy Technician

## 2023-07-14 ENCOUNTER — Encounter: Payer: Self-pay | Admitting: Oncology

## 2023-07-14 ENCOUNTER — Other Ambulatory Visit (HOSPITAL_COMMUNITY): Payer: Self-pay

## 2023-07-14 NOTE — Telephone Encounter (Signed)
 Pharmacy Patient Advocate Encounter   Received notification from Onbase that prior authorization for [facility] 5MG /0.5ML AUTO-INJECTORS is required/requested.   Insurance verification completed.   The patient is insured through [employer] .   Per test claim: PA required; PA submitted to above mentioned insurance via CoverMyMeds Key/confirmation #/EOC [id number] Status is pending

## 2023-07-14 NOTE — Telephone Encounter (Signed)
 Pharmacy Patient Advocate Encounter  Received notification from [facility] that Prior Authorization for [facility] 5MG /0.5ML AUTO-INJECTORS has been APPROVED from 07/14/2023 to 05/01/2024. Unable to obtain price due to refill too soon rejection, last fill date 06/28/2023 next available fill date03/19/2025.   PA #/Case ID/Reference #: [id number]

## 2023-07-18 ENCOUNTER — Ambulatory Visit (INDEPENDENT_AMBULATORY_CARE_PROVIDER_SITE_OTHER): Admitting: Podiatry

## 2023-07-18 DIAGNOSIS — M79675 Pain in left toe(s): Secondary | ICD-10-CM | POA: Diagnosis not present

## 2023-07-18 DIAGNOSIS — M79674 Pain in right toe(s): Secondary | ICD-10-CM

## 2023-07-18 DIAGNOSIS — B351 Tinea unguium: Secondary | ICD-10-CM

## 2023-07-18 DIAGNOSIS — E1142 Type 2 diabetes mellitus with diabetic polyneuropathy: Secondary | ICD-10-CM

## 2023-07-18 NOTE — Progress Notes (Signed)
  Subjective:  Patient ID: [name], female    DOB: 1965-10-15,  MRN: [id number]  Chief Complaint  Patient presents with   Diabetes    Nail trim    58 y.o. female returns for the above complaint.  Patient presents with thickened onychodystrophy mycotic toenails x 10 mild pain on palpation hurts with ambulation as pressure she is not regular self denies any other acute, she would like for me to debride down  Objective:  There were no vitals filed for this visit. Podiatric Exam: Vascular: dorsalis pedis and posterior tibial pulses are palpable bilateral. Capillary return is immediate. Temperature gradient is WNL. Skin turgor WNL  Sensorium: Normal Semmes Weinstein monofilament test. Normal tactile sensation bilaterally. Nail Exam: Pt has thick disfigured discolored nails with subungual debris noted bilateral entire nail hallux through fifth toenails.  Pain on palpation to the nails. Ulcer Exam: There is no evidence of ulcer or pre-ulcerative changes or infection. Orthopedic Exam: Muscle tone and strength are WNL. No limitations in general ROM. No crepitus or effusions noted.  Skin: No Porokeratosis. No infection or ulcers    Assessment & Plan:  No diagnosis found.  Patient was evaluated and treated and all questions answered.  Onychomycosis with pain  -Nails palliatively debrided as below. -Educated on self-care  Procedure: Nail Debridement Rationale: pain  Type of Debridement: manual, sharp debridement. Instrumentation: Nail nipper, rotary burr. Number of Nails: 10  Procedures and Treatment: Consent by patient was obtained for treatment procedures. The patient understood the discussion of treatment and procedures well. All questions were answered thoroughly reviewed. Debridement of mycotic and hypertrophic toenails, 1 through 5 bilateral and clearing of subungual debris. No ulceration, no infection noted.  Return Visit-Office Procedure: Patient instructed to return to  the office for a follow up visit 3 months for continued evaluation and treatment.  Nicholes Rough, DPM    No follow-ups on file.

## 2023-08-01 ENCOUNTER — Encounter: Payer: Self-pay | Admitting: Oncology

## 2023-08-04 ENCOUNTER — Ambulatory Visit: Payer: Self-pay | Admitting: Internal Medicine

## 2023-08-10 ENCOUNTER — Encounter: Payer: Self-pay | Admitting: Internal Medicine

## 2023-08-10 ENCOUNTER — Ambulatory Visit (INDEPENDENT_AMBULATORY_CARE_PROVIDER_SITE_OTHER): Admitting: Internal Medicine

## 2023-08-10 VITALS — BP 129/75 | HR 86 | Ht 70.0 in | Wt 234.2 lb

## 2023-08-10 DIAGNOSIS — E1140 Type 2 diabetes mellitus with diabetic neuropathy, unspecified: Secondary | ICD-10-CM | POA: Diagnosis not present

## 2023-08-10 DIAGNOSIS — Z794 Long term (current) use of insulin: Secondary | ICD-10-CM | POA: Diagnosis not present

## 2023-08-10 DIAGNOSIS — C187 Malignant neoplasm of sigmoid colon: Secondary | ICD-10-CM | POA: Diagnosis not present

## 2023-08-10 DIAGNOSIS — Z23 Encounter for immunization: Secondary | ICD-10-CM

## 2023-08-10 DIAGNOSIS — F251 Schizoaffective disorder, depressive type: Secondary | ICD-10-CM

## 2023-08-10 DIAGNOSIS — E1149 Type 2 diabetes mellitus with other diabetic neurological complication: Secondary | ICD-10-CM | POA: Diagnosis not present

## 2023-08-10 DIAGNOSIS — E538 Deficiency of other specified B group vitamins: Secondary | ICD-10-CM | POA: Diagnosis not present

## 2023-08-10 DIAGNOSIS — N184 Chronic kidney disease, stage 4 (severe): Secondary | ICD-10-CM | POA: Diagnosis not present

## 2023-08-10 DIAGNOSIS — E785 Hyperlipidemia, unspecified: Secondary | ICD-10-CM

## 2023-08-10 DIAGNOSIS — I10 Essential (primary) hypertension: Secondary | ICD-10-CM | POA: Diagnosis not present

## 2023-08-10 DIAGNOSIS — E1169 Type 2 diabetes mellitus with other specified complication: Secondary | ICD-10-CM | POA: Diagnosis not present

## 2023-08-10 NOTE — Assessment & Plan Note (Addendum)
 On Abilify and Zoloft Better controlled Followed by psychiatry- check Vit D, B12, folate and iron panel per Psychiatry recommendation  EKG: Sinus rhythm. QTC 389 msec. No signs of active ischemia.

## 2023-08-10 NOTE — Assessment & Plan Note (Signed)
 status post partial colectomy in January 2021 Needs repeat colonoscopy Had referred to GI and Oncology

## 2023-08-10 NOTE — Patient Instructions (Addendum)
 Please Schedule Annual Wellness visit.  Please continue to take medications as prescribed.  Please continue to follow low carb diet and perform moderate exercise/walking as tolerated.

## 2023-08-10 NOTE — Assessment & Plan Note (Addendum)
 AKI on CKD in 09/24 Likely due to dehydration Improved with gentle hydration Had held metformin and additional 20 mg dose of Lasix Check CMP

## 2023-08-10 NOTE — Assessment & Plan Note (Addendum)
 BP Readings from Last 1 Encounters:  08/10/23 129/75   Well-controlled with Losartan-HCTZ and Metoprolol Also takes Lasix for LE swelling, advised to take Lasix 40 mg daily only for now Counseled for compliance with the medications Advised DASH diet and moderate exercise/walking, at least 150 mins/week  Check EKG due to use of Abilify EKG: Sinus rhythm. QTC 389 msec. No signs of active ischemia.

## 2023-08-10 NOTE — Assessment & Plan Note (Signed)
 Lab Results  Component Value Date   HGBA1C 6.8 (H) 01/12/2023   Well-controlled currently, better than prior On Lantus 12 U QD, held Metformin 1000 mg BID due to GFR < 30 On glipizide 5 mg daily On Mounjaro, increase dose as tolerated - 5 mg qw now  Check CMP and HbA1c  Advised to follow diabetic diet On statin and ARB F/u CMP and lipid panel Diabetic eye exam: Advised to follow up with Ophthalmology for diabetic eye exam

## 2023-08-10 NOTE — Progress Notes (Signed)
 Established Patient Office Visit  Subjective:  Patient ID: [name], female    DOB: [birth year]  Age: 58 y.o. MRN: [id number]  CC:  Chief Complaint  Patient presents with   Care Management    Follow up needs blood work    HPI [name] is a 58 y.o. female with past medical history of HTN, uncontrolled type 2 DM with neuropathy, sigmoid colon ca. S/p partial colectomy, schizoaffective disorder and obesity who presents for f/u of her chronic medical conditions.    Type II DM: She has been taking Mounjaro 5 mg qw and tolerates it overall, complains of mild nausea, but is manageable.  She has resumed taking Lantus 12 U qHS and glipizide 5 mg QD. She has not brought blood glucose log, but reports that it has been running around 120-150 now.  Denies any episode of hypoglycemia.  She has lost 43 lbs in 1 year with Mounjaro. She takes gabapentin for diabetic neuropathy.  HTN: Her BP is wnl today.  She has been taking losartan-HCTZ 50-12.5 mg daily.  She denies any headache, dizziness, chest pain or palpitations.  Schizoaffective disorder: She sees Dr. Adrian Blackwater currently.  She is placed on Zoloft.  She also takes Abilify and Wellbutrin. Her MoCA was 30/30 in 02/24.  Chronic leg swelling: She has seen vascular surgery for evaluation of chronic leg swelling and leg ulcer, and was told to use compression stocking.  She also takes Lasix for it.  She has been trying to perform leg elevation as well.  Past Medical History:  Diagnosis Date   Anemia    Charcot's joint of foot, left    Depression    Diabetes mellitus without complication (HCC)    Hyperlipidemia    Hypertension    Schizoaffective disorder (HCC) 06/29/2015    Past Surgical History:  Procedure Laterality Date   CHOLECYSTECTOMY     COLONOSCOPY WITH PROPOFOL N/A 05/26/2019   Procedure: COLONOSCOPY WITH PROPOFOL;  Surgeon: Toledo, Boykin Nearing, MD;  Location: [facility];  Service: Gastroenterology;  Laterality:  N/A;   ESOPHAGOGASTRODUODENOSCOPY (EGD) WITH PROPOFOL N/A 05/26/2019   Procedure: ESOPHAGOGASTRODUODENOSCOPY (EGD) WITH PROPOFOL;  Surgeon: Toledo, Boykin Nearing, MD;  Location: [facility];  Service: Gastroenterology;  Laterality: N/A;   IRRIGATION AND DEBRIDEMENT FOOT Right 02/26/2016   Procedure: RIGHT 2ND TOE DEBRIDEMENT;  Surgeon: Gwyneth Revels, DPM;  Location: [facility];  Service: Podiatry;  Laterality: Right;   IRRIGATION AND DEBRIDEMENT FOOT Left 12/13/2018   Procedure: IRRIGATION AND DEBRIDEMENT FOOT;  Surgeon: Rosetta Posner, DPM;  Location: [facility];  Service: Podiatry;  Laterality: Left;    Family History  Problem Relation Age of Onset   Breast cancer Cousin        maternal cousin   Hypertension Mother    Hyperthyroidism Mother    Dementia Mother    Prostate cancer Father    Diabetes Maternal Grandmother    Hypertension Maternal Grandmother    Cancer Maternal Grandmother        unknown type   Diabetes Maternal Grandfather    Hypertension Maternal Grandfather    Diabetes Paternal Grandmother    Hypertension Paternal Grandmother    Diabetes Paternal Grandfather    Hypertension Paternal Grandfather     Social History   Socioeconomic History   Marital status: Divorced    Spouse name: Not on file   Number of children: 2   Years of education: Not on file   Highest education level: Not on file  Occupational History  Not on file  Tobacco Use   Smoking status: Former    Current packs/day: 0.00    Average packs/day: 1 pack/day for 30.0 years (30.0 ttl pk-yrs)    Types: Cigarettes    Start date: 01/31/1992    Quit date: 01/30/2022    Years since quitting: 1.5   Smokeless tobacco: Never   Tobacco comments:    Quit smoking on 07/01/23  Vaping Use   Vaping status: Never Used  Substance and Sexual Activity   Alcohol use: No   Drug use: No   Sexual activity: Not Currently  Other Topics Concern   Not on file  Social History Narrative   At home with mom, 85. Lives in  brothers house who moved out. Sister comes by every other day to check on patient and mom.   Social Drivers of Corporate investment banker Strain: Low Risk  (11/04/2020)   Overall Financial Resource Strain (CARDIA)    Difficulty of Paying Living Expenses: Not hard at all  Food Insecurity: No Food Insecurity (01/16/2022)   Hunger Vital Sign    Worried About Running Out of Food in the Last Year: Never true    Ran Out of Food in the Last Year: Never true  Transportation Needs: No Transportation Needs (01/16/2022)   PRAPARE - Administrator, Civil Service (Medical): No    Lack of Transportation (Non-Medical): No  Physical Activity: Inactive (11/04/2020)   Exercise Vital Sign    Days of Exercise per Week: 0 days    Minutes of Exercise per Session: 0 min  Stress: No Stress Concern Present (11/04/2020)   [employer] of Occupational Health - Occupational Stress Questionnaire    Feeling of Stress : Only a little  Social Connections: Socially Isolated (11/04/2020)   Social Connection and Isolation Panel [NHANES]    Frequency of Communication with Friends and Family: More than three times a week    Frequency of Social Gatherings with Friends and Family: More than three times a week    Attends Religious Services: Never    Database administrator or Organizations: No    Attends Banker Meetings: Never    Marital Status: Divorced  Catering manager Violence: Not At Risk (01/16/2022)   Humiliation, Afraid, Rape, and Kick questionnaire    Fear of Current or Ex-Partner: No    Emotionally Abused: No    Physically Abused: No    Sexually Abused: No    Outpatient Medications Prior to Visit  Medication Sig Dispense Refill   ACCU-CHEK GUIDE test strip USE TO CHECK BLOOD SUGAR FOUR TIMES DAILY 100 strip 0   Accu-Chek Softclix Lancets lancets USE TO CHECK BLOOD SUGAR FOUR TIMES DAILY 100 each 0   ARIPiprazole (ABILIFY) 20 MG tablet Take 1 tablet (20 mg total) by mouth daily. 30  tablet 2   atorvastatin (LIPITOR) 40 MG tablet TAKE 1 TABLET BY MOUTH ONCE A DAY 30 tablet 5   Blood Glucose Monitoring Suppl (ACCU-CHEK GUIDE ME) w/Device KIT USE TO CHECK BLOOD SUGAR FOUR TIMES DAILY 1 kit 0   Continuous Blood Gluc Sensor (DEXCOM G7 SENSOR) MISC Please use it to check blood glucose 3 times before meals, at bedtime and as needed. 3 each 11   furosemide (LASIX) 40 MG tablet TAKE 1 TABLET BY MOUTH DAILY 30 tablet 5   gabapentin (NEURONTIN) 300 MG capsule TAKE 1 CAPSULE AT BEDTIME 30 capsule 5   glipiZIDE (GLUCOTROL XL) 5 MG 24 hr tablet TAKE  1 TABLET BY MOUTH ONCE DAILY WITH BREAKFAST 30 tablet 5   insulin glargine (LANTUS SOLOSTAR) 100 UNIT/ML Solostar Pen Inject 12 Units into the skin at bedtime. 9 mL 1   losartan-hydrochlorothiazide (HYZAAR) 50-12.5 MG tablet TAKE 1 TABLET DAILY 30 tablet 5   metoprolol succinate (TOPROL-XL) 50 MG 24 hr tablet TAKE 1 TABLET BY MOUTH DAILY WITH OR IMMEDIATELY FOLLOWING A MEAL 30 tablet 5   Misc. Devices [facility]) MISC Use it to ambulate short distance. 1 each 0   MOUNJARO 5 MG/0.5ML Pen INJECT 5MG  INTO THE SKIN ONCE WEEKLY. 2 mL 5   mupirocin ointment (BACTROBAN) 2 % Apply 1 Application topically 2 (two) times daily. 22 g 0   pantoprazole (PROTONIX) 40 MG tablet TAKE 1 TABLET DAILY 30 tablet 5   sertraline (ZOLOFT) 25 MG tablet Take 1 tablet (25 mg total) by mouth daily. 30 tablet 2   No facility-administered medications prior to visit.    No Known Allergies  ROS Review of Systems  Constitutional:  Negative for chills and fever.  HENT:  Negative for congestion, sinus pressure, sinus pain and sore throat.   Eyes:  Negative for pain and discharge.  Respiratory:  Negative for cough and shortness of breath.   Cardiovascular:  Positive for leg swelling. Negative for chest pain and palpitations.  Gastrointestinal:  Negative for abdominal pain, constipation, nausea and vomiting.  Endocrine: Negative for polydipsia and polyuria.   Genitourinary:  Negative for dysuria and hematuria.  Musculoskeletal:  Positive for arthralgias and gait problem. Negative for neck pain and neck stiffness.  Skin:  Negative for rash.  Neurological:  Negative for dizziness and weakness.  Psychiatric/Behavioral:  Negative for agitation and behavioral problems. The patient is nervous/anxious.       Objective:    Physical Exam Vitals reviewed.  Constitutional:      General: She is not in acute distress.    Appearance: She is obese. She is not diaphoretic.     Comments: Uses walker  HENT:     Head: Normocephalic and atraumatic.     Nose: Nose normal.     Mouth/Throat:     Mouth: Mucous membranes are moist.     Pharynx: No posterior oropharyngeal erythema.  Eyes:     General: No scleral icterus.    Extraocular Movements: Extraocular movements intact.  Cardiovascular:     Rate and Rhythm: Normal rate and regular rhythm.     Heart sounds: Normal heart sounds. No murmur heard. Pulmonary:     Breath sounds: Normal breath sounds. No wheezing or rales.  Abdominal:     Palpations: Abdomen is soft.     Tenderness: There is no abdominal tenderness.  Musculoskeletal:     Cervical back: Neck supple. No tenderness.     Right lower leg: Edema (Mild) present.     Left lower leg: Edema (Mild) present.  Skin:    General: Skin is warm.     Findings: Erythema (With mild swelling over left leg-about 3 cm in diameter) present. No rash.  Neurological:     General: No focal deficit present.     Mental Status: She is alert and oriented to person, place, and time.     Sensory: Sensory deficit (B/l LE) present.     Motor: Weakness (3/5 - b/l LE) present.  Psychiatric:        Mood and Affect: Mood normal.        Behavior: Behavior normal.     BP 129/75  Pulse 86   Ht 5\' 10"  (1.778 m)   Wt 234 lb 3.2 oz (106.2 kg)   SpO2 99%   BMI 33.60 kg/m  Wt Readings from Last 3 Encounters:  08/10/23 234 lb 3.2 oz (106.2 kg)  01/12/23 246 lb 9.6 oz  (111.9 kg)  12/08/22 252 lb 4.8 oz (114.4 kg)    Lab Results  Component Value Date   TSH 2.140 11/23/2021   Lab Results  Component Value Date   WBC 12.1 (H) 01/12/2023   HGB 10.0 (L) 01/12/2023   HCT 31.9 (L) 01/12/2023   MCV 90 01/12/2023   PLT 310 01/12/2023   Lab Results  Component Value Date   NA 139 01/12/2023   K 4.7 01/12/2023   CO2 19 (L) 01/12/2023   GLUCOSE 69 (L) 01/12/2023   BUN 33 (H) 01/12/2023   CREATININE 1.80 (H) 01/12/2023   BILITOT <0.2 01/12/2023   ALKPHOS 148 (H) 01/12/2023   AST 11 01/12/2023   ALT 12 01/12/2023   PROT 6.5 01/12/2023   ALBUMIN 3.9 01/12/2023   CALCIUM 9.0 01/12/2023   ANIONGAP 6 01/18/2022   EGFR 32 (L) 01/12/2023   Lab Results  Component Value Date   CHOL 155 01/12/2023   Lab Results  Component Value Date   HDL 27 (L) 01/12/2023   Lab Results  Component Value Date   LDLCALC 73 01/12/2023   Lab Results  Component Value Date   TRIG 342 (H) 01/12/2023   Lab Results  Component Value Date   CHOLHDL 5.7 (H) 01/12/2023   Lab Results  Component Value Date   HGBA1C 6.8 (H) 01/12/2023      Assessment & Plan:   Problem List Items Addressed This Visit       Cardiovascular and Mediastinum   Essential hypertension - Primary (Chronic)   BP Readings from Last 1 Encounters:  08/10/23 129/75   Well-controlled with Losartan-HCTZ and Metoprolol Also takes Lasix for LE swelling, advised to take Lasix 40 mg daily only for now Counseled for compliance with the medications Advised DASH diet and moderate exercise/walking, at least 150 mins/week  Check EKG due to use of Abilify EKG: Sinus rhythm. QTC 389 msec. No signs of active ischemia.      Relevant Orders   EKG 12-Lead (Completed)     Digestive   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HCC)   status post partial colectomy in January 2021 Needs repeat colonoscopy Had referred to GI and Oncology        Endocrine   Hyperlipidemia associated with type 2 diabetes  mellitus (HCC)   On Lipitor Check lipid profile      Relevant Orders   Lipid Profile   Type 2 diabetes mellitus with diabetic neuropathy, with long-term current use of insulin (HCC)   Lab Results  Component Value Date   HGBA1C 6.8 (H) 01/12/2023   Well-controlled currently, better than prior On Lantus 12 U QD, held Metformin 1000 mg BID due to GFR < 30 On glipizide 5 mg daily On Mounjaro, increase dose as tolerated - 5 mg qw now  Check CMP and HbA1c  Advised to follow diabetic diet On statin and ARB F/u CMP and lipid panel Diabetic eye exam: Advised to follow up with Ophthalmology for diabetic eye exam      Relevant Orders   CMP14+EGFR   Hemoglobin A1c     Genitourinary   CKD (chronic kidney disease) stage 4, GFR 15-29 ml/min (HCC)   AKI on CKD in 09/24 Likely  due to dehydration Improved with gentle hydration Had held metformin and additional 20 mg dose of Lasix Check CMP      Relevant Orders   CBC with Differential/Platelet   CMP14+EGFR   Vitamin D (25 hydroxy)   Fe+TIBC+Fer     Other   Schizoaffective disorder (HCC)   On Abilify and Zoloft Better controlled Followed by psychiatry- check Vit D, B12, folate and iron panel per Psychiatry recommendation  EKG: Sinus rhythm. QTC 389 msec. No signs of active ischemia.      Relevant Orders   CMP14+EGFR   Vitamin D (25 hydroxy)   B12 and Folate Panel   Fe+TIBC+Fer   Other Visit Diagnoses       Vitamin B12 deficiency       Relevant Orders   B12 and Folate Panel     Encounter for immunization       Relevant Orders   Pneumococcal conjugate vaccine 20-valent (Completed)       No orders of the defined types were placed in this encounter.   Follow-up: Return in about 4 months (around 12/10/2023) for DM and CKD.    Anabel Halon, MD

## 2023-08-10 NOTE — Assessment & Plan Note (Signed)
On Lipitor Check lipid profile 

## 2023-08-10 NOTE — Assessment & Plan Note (Deleted)
 Lab Results  Component Value Date   HGBA1C 6.8 (H) 01/12/2023   Well-controlled currently, better than prior On Lantus 12 U QD, held Metformin 1000 mg BID due to GFR < 30 On glipizide 5 mg daily On Mounjaro, increase dose as tolerated - 5 mg qw now  Check CMP and HbA1c  Advised to follow diabetic diet On statin and ARB F/u CMP and lipid panel Diabetic eye exam: Advised to follow up with Ophthalmology for diabetic eye exam

## 2023-08-11 ENCOUNTER — Other Ambulatory Visit: Payer: Self-pay | Admitting: Internal Medicine

## 2023-08-11 DIAGNOSIS — E538 Deficiency of other specified B group vitamins: Secondary | ICD-10-CM | POA: Insufficient documentation

## 2023-08-11 DIAGNOSIS — E559 Vitamin D deficiency, unspecified: Secondary | ICD-10-CM

## 2023-08-11 LAB — CBC WITH DIFFERENTIAL/PLATELET
Basophils Absolute: 0.1 10*3/uL (ref 0.0–0.2)
Basos: 2 %
EOS (ABSOLUTE): 0.6 10*3/uL — ABNORMAL HIGH (ref 0.0–0.4)
Eos: 6 %
Hematocrit: 31.8 % — ABNORMAL LOW (ref 34.0–46.6)
Hemoglobin: 10.4 g/dL — ABNORMAL LOW (ref 11.1–15.9)
Immature Grans (Abs): 0 10*3/uL (ref 0.0–0.1)
Immature Granulocytes: 0 %
Lymphocytes Absolute: 3.5 10*3/uL — ABNORMAL HIGH (ref 0.7–3.1)
Lymphs: 38 %
MCH: 29.5 pg (ref 26.6–33.0)
MCHC: 32.7 g/dL (ref 31.5–35.7)
MCV: 90 fL (ref 79–97)
Monocytes Absolute: 0.6 10*3/uL (ref 0.1–0.9)
Monocytes: 6 %
Neutrophils Absolute: 4.5 10*3/uL (ref 1.4–7.0)
Neutrophils: 48 %
Platelets: 264 10*3/uL (ref 150–450)
RBC: 3.52 x10E6/uL — ABNORMAL LOW (ref 3.77–5.28)
RDW: 14.6 % (ref 11.7–15.4)
WBC: 9.4 10*3/uL (ref 3.4–10.8)

## 2023-08-11 LAB — B12 AND FOLATE PANEL
Folate: 2.4 ng/mL — ABNORMAL LOW (ref >3.0)
Vitamin B-12: 210 pg/mL — ABNORMAL LOW (ref 232–1245)

## 2023-08-11 LAB — LIPID PANEL
Chol/HDL Ratio: 4.6 ratio — ABNORMAL HIGH (ref 0.0–4.4)
Cholesterol, Total: 185 mg/dL (ref 100–199)
HDL: 40 mg/dL (ref >39)
LDL Chol Calc (NIH): 104 mg/dL — ABNORMAL HIGH (ref 0–99)
Triglycerides: 239 mg/dL — ABNORMAL HIGH (ref 0–149)
VLDL Cholesterol Cal: 41 mg/dL — ABNORMAL HIGH (ref 5–40)

## 2023-08-11 LAB — CMP14+EGFR
ALT: 14 IU/L (ref 0–32)
AST: 15 IU/L (ref 0–40)
Albumin: 4.1 g/dL (ref 3.8–4.9)
Alkaline Phosphatase: 132 IU/L — ABNORMAL HIGH (ref 44–121)
BUN/Creatinine Ratio: 18 (ref 9–23)
BUN: 31 mg/dL — ABNORMAL HIGH (ref 6–24)
Bilirubin Total: <0.2 mg/dL (ref 0.0–1.2)
CO2: 20 mmol/L (ref 20–29)
Calcium: 9.4 mg/dL (ref 8.7–10.2)
Chloride: 105 mmol/L (ref 96–106)
Creatinine, Ser: 1.73 mg/dL — ABNORMAL HIGH (ref 0.57–1.00)
Globulin, Total: 2.5 g/dL (ref 1.5–4.5)
Glucose: 91 mg/dL (ref 70–99)
Potassium: 4.7 mmol/L (ref 3.5–5.2)
Sodium: 139 mmol/L (ref 134–144)
Total Protein: 6.6 g/dL (ref 6.0–8.5)
eGFR: 34 mL/min/{1.73_m2} — ABNORMAL LOW (ref >59)

## 2023-08-11 LAB — IRON,TIBC AND FERRITIN PANEL
Ferritin: 94 ng/mL (ref 15–150)
Iron Saturation: 20 % (ref 15–55)
Iron: 54 ug/dL (ref 27–159)
Total Iron Binding Capacity: 264 ug/dL (ref 250–450)
UIBC: 210 ug/dL (ref 131–425)

## 2023-08-11 LAB — VITAMIN D 25 HYDROXY (VIT D DEFICIENCY, FRACTURES): Vit D, 25-Hydroxy: 9.9 ng/mL — ABNORMAL LOW (ref 30.0–100.0)

## 2023-08-11 LAB — HEMOGLOBIN A1C
Est. average glucose Bld gHb Est-mCnc: 148 mg/dL
Hgb A1c MFr Bld: 6.8 % — ABNORMAL HIGH (ref 4.8–5.6)

## 2023-08-11 MED ORDER — FOLIC ACID 1 MG PO TABS
1.0000 mg | ORAL_TABLET | Freq: Every day | ORAL | 1 refills | Status: AC
Start: 2023-08-11 — End: ?

## 2023-08-11 MED ORDER — VITAMIN D (ERGOCALCIFEROL) 1.25 MG (50000 UNIT) PO CAPS: 50000.0000 [IU] | ORAL_CAPSULE | ORAL | 1 refills | Status: DC

## 2023-08-11 MED ORDER — VITAMIN B-12 1000 MCG PO TABS
1000.0000 ug | ORAL_TABLET | Freq: Every day | ORAL | 2 refills | Status: AC
Start: 2023-08-11 — End: ?

## 2023-08-14 ENCOUNTER — Encounter: Payer: Self-pay | Admitting: Oncology

## 2023-08-25 ENCOUNTER — Telehealth: Payer: Self-pay

## 2023-08-25 NOTE — Telephone Encounter (Signed)
 Copied from CRM [id number]. Topic: Clinical - Medication Question >> Aug 24, 2023  3:08 PM [name] wrote: Reason for CRM: Patient would like to know what the medication folic acid  (FOLVITE ) 1 MG tablet is and if she's supposed to take it with other medication. Please call [phone]

## 2023-08-25 NOTE — Telephone Encounter (Signed)
 Left detailed vm

## 2023-08-28 ENCOUNTER — Encounter: Payer: Self-pay | Admitting: Oncology

## 2023-09-04 ENCOUNTER — Telehealth (HOSPITAL_COMMUNITY): Admitting: Psychiatry

## 2023-09-11 ENCOUNTER — Other Ambulatory Visit (HOSPITAL_COMMUNITY): Payer: Self-pay | Admitting: Psychiatry

## 2023-09-11 DIAGNOSIS — F251 Schizoaffective disorder, depressive type: Secondary | ICD-10-CM

## 2023-09-21 ENCOUNTER — Other Ambulatory Visit: Payer: Self-pay | Admitting: Internal Medicine

## 2023-09-21 DIAGNOSIS — E1149 Type 2 diabetes mellitus with other diabetic neurological complication: Secondary | ICD-10-CM

## 2023-09-21 MED ORDER — MOUNJARO 5 MG/0.5ML ~~LOC~~ SOAJ: 5.0000 mg | SUBCUTANEOUS | 1 refills | Status: DC

## 2023-09-21 NOTE — Telephone Encounter (Signed)
 Copied from CRM [id number]. Topic: Clinical - Medication Refill >> Sep 21, 2023  8:44 AM Alpha Arts wrote: Medication: MOUNJARO  5 MG/0.5ML Pen   Has the patient contacted their pharmacy? Yes (Agent: If no, request that the patient contact the pharmacy for the refill. If patient does not wish to contact the pharmacy document the reason why and proceed with request.) (Agent: If yes, when and what did the pharmacy advise?)  This is the patient's preferred pharmacy:  [facility] - [place], [place] - [address] [place] [address] Phone: [phone] Fax: [phone]  Is this the correct pharmacy for this prescription? Yes If no, delete pharmacy and type the correct one.   Has the prescription been filled recently? Yes  Is the patient out of the medication? Yes  Has the patient been seen for an appointment in the last year OR does the patient have an upcoming appointment? Yes  Can we respond through MyChart? Yes  Agent: Please be advised that Rx refills may take up to 3 business days. We ask that you follow-up with your pharmacy.

## 2023-10-10 ENCOUNTER — Other Ambulatory Visit: Payer: Self-pay | Admitting: Internal Medicine

## 2023-10-10 DIAGNOSIS — E1149 Type 2 diabetes mellitus with other diabetic neurological complication: Secondary | ICD-10-CM

## 2023-10-10 DIAGNOSIS — R600 Localized edema: Secondary | ICD-10-CM

## 2023-10-10 DIAGNOSIS — E1169 Type 2 diabetes mellitus with other specified complication: Secondary | ICD-10-CM

## 2023-10-10 DIAGNOSIS — I10 Essential (primary) hypertension: Secondary | ICD-10-CM

## 2023-10-10 DIAGNOSIS — E1142 Type 2 diabetes mellitus with diabetic polyneuropathy: Secondary | ICD-10-CM

## 2023-10-16 ENCOUNTER — Ambulatory Visit: Admitting: Internal Medicine

## 2023-11-08 ENCOUNTER — Telehealth: Payer: Self-pay | Admitting: Internal Medicine

## 2023-11-08 DIAGNOSIS — F251 Schizoaffective disorder, depressive type: Secondary | ICD-10-CM

## 2023-11-08 DIAGNOSIS — F411 Generalized anxiety disorder: Secondary | ICD-10-CM

## 2023-11-08 MED ORDER — SERTRALINE HCL 25 MG PO TABS
25.0000 mg | ORAL_TABLET | Freq: Every day | ORAL | 5 refills | Status: AC
Start: 2023-11-08 — End: ?

## 2023-11-08 MED ORDER — ARIPIPRAZOLE 20 MG PO TABS
20.0000 mg | ORAL_TABLET | Freq: Every day | ORAL | 5 refills | Status: AC
Start: 2023-11-08 — End: ?

## 2023-11-08 NOTE — Telephone Encounter (Signed)
 Copied from CRM [id number]. Topic: Clinical - Medication Refill >> Nov 08, 2023  1:43 PM Tiffini S wrote: Medication: ARIPiprazole  (ABILIFY ) 20 MG tablet and  Sertraline  (ZOLOFT ) 25 MG tablet   Has the patient contacted their pharmacy? No (Agent: If no, request that the patient contact the pharmacy for the refill. If patient does not wish to contact the pharmacy document the reason why and proceed with request.) (Agent: If yes, when and what did the pharmacy advise?)  This is the patient's preferred pharmacy:  [facility] - [place], [place] - [address] [place] [address] Phone: [phone] Fax: [phone]   Is this the correct pharmacy for this prescription? Yes If no, delete pharmacy and type the correct one.   Has the prescription been filled recently? Yes  Is the patient out of the medication? Yes  Has the patient been seen for an appointment in the last year OR does the patient have an upcoming appointment? Yes  Can we respond through MyChart? Yes  Agent: Please be advised that Rx refills may take up to 3 business days. We ask that you follow-up with your pharmacy.

## 2023-12-12 ENCOUNTER — Ambulatory Visit: Admitting: Internal Medicine

## 2024-01-15 ENCOUNTER — Encounter: Payer: Self-pay | Admitting: Oncology

## 2024-01-15 ENCOUNTER — Other Ambulatory Visit (HOSPITAL_COMMUNITY): Payer: Self-pay

## 2024-01-15 ENCOUNTER — Telehealth: Payer: Self-pay | Admitting: Pharmacy Technician

## 2024-01-15 NOTE — Telephone Encounter (Signed)
 ERROR

## 2024-01-15 NOTE — Telephone Encounter (Signed)
 Pharmacy Patient Advocate Encounter   Received notification from CoverMyMeds that prior authorization for Mounjaro  5MG /0.5ML auto-injectors is required/requested.   Insurance verification completed.   The patient is insured through [employer] .   Per test claim: The current 84 day co-pay is, $0.00.  No PA needed at this time. This test claim was processed through [facility]- copay amounts may vary at other pharmacies due to pharmacy/plan contracts, or as the patient moves through the different stages of their insurance plan.

## 2024-01-15 NOTE — Telephone Encounter (Signed)
 Pharmacy Patient Advocate Encounter   Received notification from CoverMyMeds that prior authorization for Mounjaro  5MG /0.5ML auto-injectors is required/requested.   Insurance verification completed.   The patient is insured through [employer] .   Per test claim: PA required; PA submitted to above mentioned insurance via Latent Key/confirmation #/EOC [id number] Status is pending

## 2024-01-15 NOTE — Telephone Encounter (Signed)
 Pharmacy Patient Advocate Encounter  Received notification from [facility] MEDICARE that Prior Authorization for Mounjaro  5MG /0.5ML auto-injectors has been APPROVED from 01/15/2024 to 05/01/2024. Ran test claim, Copay is $0.00. This test claim was processed through [facility]- copay amounts may vary at other pharmacies due to pharmacy/plan contracts, or as the patient moves through the different stages of their insurance plan.   PA #/Case ID/Reference #: [id number]

## 2024-01-18 ENCOUNTER — Encounter: Payer: Self-pay | Admitting: Oncology

## 2024-01-24 ENCOUNTER — Ambulatory Visit: Payer: Self-pay | Admitting: Internal Medicine

## 2024-01-25 ENCOUNTER — Encounter: Payer: Self-pay | Admitting: Internal Medicine

## 2024-01-25 ENCOUNTER — Encounter: Payer: Self-pay | Admitting: Oncology

## 2024-01-25 ENCOUNTER — Telehealth: Payer: Self-pay

## 2024-01-25 ENCOUNTER — Ambulatory Visit (INDEPENDENT_AMBULATORY_CARE_PROVIDER_SITE_OTHER): Payer: Self-pay | Admitting: Internal Medicine

## 2024-01-25 VITALS — BP 100/68 | HR 96 | Ht 71.0 in | Wt 218.0 lb

## 2024-01-25 DIAGNOSIS — L03116 Cellulitis of left lower limb: Secondary | ICD-10-CM | POA: Diagnosis not present

## 2024-01-25 DIAGNOSIS — Z794 Long term (current) use of insulin: Secondary | ICD-10-CM

## 2024-01-25 DIAGNOSIS — E559 Vitamin D deficiency, unspecified: Secondary | ICD-10-CM

## 2024-01-25 DIAGNOSIS — N184 Chronic kidney disease, stage 4 (severe): Secondary | ICD-10-CM | POA: Diagnosis not present

## 2024-01-25 DIAGNOSIS — E1140 Type 2 diabetes mellitus with diabetic neuropathy, unspecified: Secondary | ICD-10-CM | POA: Diagnosis not present

## 2024-01-25 DIAGNOSIS — Z23 Encounter for immunization: Secondary | ICD-10-CM

## 2024-01-25 DIAGNOSIS — I10 Essential (primary) hypertension: Secondary | ICD-10-CM

## 2024-01-25 DIAGNOSIS — Z0001 Encounter for general adult medical examination with abnormal findings: Secondary | ICD-10-CM

## 2024-01-25 DIAGNOSIS — F251 Schizoaffective disorder, depressive type: Secondary | ICD-10-CM | POA: Diagnosis not present

## 2024-01-25 DIAGNOSIS — N3946 Mixed incontinence: Secondary | ICD-10-CM

## 2024-01-25 MED ORDER — VITAMIN D (ERGOCALCIFEROL) 1.25 MG (50000 UNIT) PO CAPS: 50000.0000 [IU] | ORAL_CAPSULE | ORAL | 1 refills | Status: AC

## 2024-01-25 MED ORDER — CEPHALEXIN 500 MG PO CAPS: 500.0000 mg | ORAL_CAPSULE | Freq: Three times a day (TID) | ORAL | 0 refills | Status: AC

## 2024-01-25 NOTE — Progress Notes (Addendum)
 Established Patient Office Visit  Subjective:  Patient ID: [name], female    DOB: [birth year]  Age: 58 y.o. MRN: [id number]  CC:  Chief Complaint  Patient presents with   Leg Pain    Evaluate legs, having pain and pressure    Referral    Referral to a therapist    Annual Exam    HPI [name] is a 58 y.o. female with past medical history of HTN, uncontrolled type 2 DM with neuropathy, sigmoid colon ca. S/p partial colectomy, schizoaffective disorder and obesity who presents for f/u of her chronic medical conditions.  Type II DM: She has been taking Mounjaro  5 mg qw and tolerates it overall, complains of mild nausea, but is manageable.  She has stopped taking Lantus  12 U at bedtime, but still takes glipizide  5 mg QD. She has not brought blood glucose log, but reports that it has been running around 150-180 now.  Denies any episode of hypoglycemia.  She has lost 59 lbs  with Mounjaro . She takes gabapentin  for diabetic neuropathy.  HTN: Her BP is wnl today.  She has been taking losartan -HCTZ 50-12.5 mg daily.  She denies any headache, dizziness, chest pain or palpitations.  Schizoaffective disorder: She takes Zoloft , Abilify  and Wellbutrin . Her MoCA was 30/30 in 02/24. She used to see Dr. Barbra.  Denies any recent worsening of anxiety, anhedonia, SI or HI currently.  She requests a referral to [facility] therapist.  Chronic leg swelling: She has seen vascular surgery for evaluation of chronic leg swelling and leg ulcer, and was told to use compression stocking.  She also takes Lasix  for it.  She has been trying to perform leg elevation as well.  Her leg ulcer has resolved now.  She reports left leg pain about 2 weeks ago, which is resolved now.  She has redness and edema of left leg currently.  Past Medical History:  Diagnosis Date   Anemia    Charcot's joint of foot, left    Depression    Diabetes mellitus without complication (HCC)    Hyperlipidemia    Hypertension     Schizoaffective disorder (HCC) 06/29/2015    Past Surgical History:  Procedure Laterality Date   CHOLECYSTECTOMY     COLONOSCOPY WITH PROPOFOL  N/A 05/26/2019   Procedure: COLONOSCOPY WITH PROPOFOL ;  Surgeon: Toledo, Ladell POUR, MD;  Location: [facility];  Service: Gastroenterology;  Laterality: N/A;   ESOPHAGOGASTRODUODENOSCOPY (EGD) WITH PROPOFOL  N/A 05/26/2019   Procedure: ESOPHAGOGASTRODUODENOSCOPY (EGD) WITH PROPOFOL ;  Surgeon: Toledo, Ladell POUR, MD;  Location: [facility];  Service: Gastroenterology;  Laterality: N/A;   IRRIGATION AND DEBRIDEMENT FOOT Right 02/26/2016   Procedure: RIGHT 2ND TOE DEBRIDEMENT;  Surgeon: Eva Gay, DPM;  Location: [facility] OR[facility];  Service: Podiatry;  Laterality: Right;   IRRIGATION AND DEBRIDEMENT FOOT Left 12/13/2018   Procedure: IRRIGATION AND DEBRIDEMENT FOOT;  Surgeon: Lennie Barter, DPM;  Location: [facility] OR[facility];  Service: Podiatry;  Laterality: Left;    Family History  Problem Relation Age of Onset   Breast cancer Cousin        maternal cousin   Hypertension Mother    Hyperthyroidism Mother    Dementia Mother    Prostate cancer Father    Diabetes Maternal Grandmother    Hypertension Maternal Grandmother    Cancer Maternal Grandmother        unknown type   Diabetes Maternal Grandfather    Hypertension Maternal Grandfather    Diabetes Paternal Grandmother    Hypertension Paternal  Grandmother    Diabetes Paternal Grandfather    Hypertension Paternal Grandfather     Social History   Socioeconomic History   Marital status: Divorced    Spouse name: Not on file   Number of children: 2   Years of education: Not on file   Highest education level: Not on file  Occupational History   Not on file  Tobacco Use   Smoking status: Former    Current packs/day: 0.00    Average packs/day: 1 pack/day for 30.0 years (30.0 ttl pk-yrs)    Types: Cigarettes    Start date: 01/31/1992    Quit date: 01/30/2022    Years since quitting: 2.1   Smokeless  tobacco: Never   Tobacco comments:    Quit smoking on 07/01/23  Vaping Use   Vaping status: Never Used  Substance and Sexual Activity   Alcohol use: No   Drug use: No   Sexual activity: Not Currently  Other Topics Concern   Not on file  Social History Narrative   At home with mom, 85. Lives in brothers house who moved out. Sister comes by every other day to check on patient and mom.   Social Drivers of Corporate Investment Banker Strain: Low Risk  (11/04/2020)   Overall Financial Resource Strain (CARDIA)    Difficulty of Paying Living Expenses: Not hard at all  Food Insecurity: No Food Insecurity (01/16/2022)   Hunger Vital Sign    Worried About Running Out of Food in the Last Year: Never true    Ran Out of Food in the Last Year: Never true  Transportation Needs: No Transportation Needs (01/16/2022)   PRAPARE - Administrator, Civil Service (Medical): No    Lack of Transportation (Non-Medical): No  Physical Activity: Inactive (11/04/2020)   Exercise Vital Sign    Days of Exercise per Week: 0 days    Minutes of Exercise per Session: 0 min  Stress: No Stress Concern Present (11/04/2020)   [employer] of Occupational Health - Occupational Stress Questionnaire    Feeling of Stress : Only a little  Social Connections: Socially Isolated (11/04/2020)   Social Connection and Isolation Panel    Frequency of Communication with Friends and Family: More than three times a week    Frequency of Social Gatherings with Friends and Family: More than three times a week    Attends Religious Services: Never    Database Administrator or Organizations: No    Attends Banker Meetings: Never    Marital Status: Divorced  Catering Manager Violence: Not At Risk (01/16/2022)   Humiliation, Afraid, Rape, and Kick questionnaire    Fear of Current or Ex-Partner: No    Emotionally Abused: No    Physically Abused: No    Sexually Abused: No    Outpatient Medications Prior to Visit   Medication Sig Dispense Refill   ACCU-CHEK GUIDE test strip USE TO CHECK BLOOD SUGAR FOUR TIMES DAILY 100 strip 0   Accu-Chek Softclix Lancets lancets USE TO CHECK BLOOD SUGAR FOUR TIMES DAILY 100 each 0   ARIPiprazole  (ABILIFY ) 20 MG tablet Take 1 tablet (20 mg total) by mouth daily. 30 tablet 5   atorvastatin  (LIPITOR) 40 MG tablet TAKE 1 TABLET BY MOUTH ONCE A DAY 30 tablet 5   Blood Glucose Monitoring Suppl (ACCU-CHEK GUIDE ME) w/Device KIT USE TO CHECK BLOOD SUGAR FOUR TIMES DAILY 1 kit 0   Continuous Blood Gluc Sensor (DEXCOM G7 SENSOR)  MISC Please use it to check blood glucose 3 times before meals, at bedtime and as needed. 3 each 11   cyanocobalamin  (VITAMIN B12) 1000 MCG tablet Take 1 tablet (1,000 mcg total) by mouth daily. 100 tablet 2   folic acid  (FOLVITE ) 1 MG tablet Take 1 tablet (1 mg total) by mouth daily. 90 tablet 1   furosemide  (LASIX ) 40 MG tablet TAKE 1 TABLET BY MOUTH DAILY 30 tablet 5   gabapentin  (NEURONTIN ) 300 MG capsule TAKE 1 CAPSULE AT BEDTIME 30 capsule 5   glipiZIDE  (GLUCOTROL  XL) 5 MG 24 hr tablet TAKE 1 TABLET BY MOUTH ONCE DAILY WITH BREAKFAST 30 tablet 5   insulin  glargine (LANTUS  SOLOSTAR) 100 UNIT/ML Solostar Pen Inject 12 Units into the skin at bedtime. 9 mL 1   losartan -hydrochlorothiazide  (HYZAAR) 50-12.5 MG tablet TAKE 1 TABLET DAILY 30 tablet 5   metoprolol  succinate (TOPROL -XL) 50 MG 24 hr tablet TAKE 1 TABLET BY MOUTH DAILY WITH OR IMMEDIATELY FOLLOWING A MEAL 30 tablet 5   Misc. Devices [facility]) MISC Use it to ambulate short distance. 1 each 0   mupirocin  ointment (BACTROBAN ) 2 % Apply 1 Application topically 2 (two) times daily. 22 g 0   pantoprazole  (PROTONIX ) 40 MG tablet TAKE ONE TABLET BY MOUTH EVERY DAY 30 tablet 5   sertraline  (ZOLOFT ) 25 MG tablet Take 1 tablet (25 mg total) by mouth daily. 30 tablet 5   tirzepatide  (MOUNJARO ) 5 MG/0.5ML Pen Inject 5 mg into the skin once a week. 6 mL 1   Vitamin D , Ergocalciferol , (DRISDOL ) 1.25 MG  (50000 UNIT) CAPS capsule Take 1 capsule (50,000 Units total) by mouth every 7 (seven) days. 12 capsule 1   No facility-administered medications prior to visit.    No Known Allergies  ROS Review of Systems  Constitutional:  Negative for chills and fever.  HENT:  Negative for congestion, sinus pressure, sinus pain and sore throat.   Eyes:  Negative for pain and discharge.  Respiratory:  Negative for cough and shortness of breath.   Cardiovascular:  Positive for leg swelling. Negative for chest pain and palpitations.  Gastrointestinal:  Negative for abdominal pain, constipation, nausea and vomiting.  Endocrine: Negative for polydipsia and polyuria.  Genitourinary:  Negative for dysuria and hematuria.  Musculoskeletal:  Positive for arthralgias and gait problem. Negative for neck pain and neck stiffness.  Skin:  Negative for rash.  Neurological:  Negative for dizziness and weakness.  Psychiatric/Behavioral:  Negative for agitation and behavioral problems. The patient is nervous/anxious.       Objective:    Physical Exam Vitals reviewed.  Constitutional:      General: She is not in acute distress.    Appearance: She is obese. She is not diaphoretic.     Comments: Uses walker  HENT:     Head: Normocephalic and atraumatic.     Nose: Nose normal.     Mouth/Throat:     Mouth: Mucous membranes are moist.     Pharynx: No posterior oropharyngeal erythema.  Eyes:     General: No scleral icterus.    Extraocular Movements: Extraocular movements intact.  Cardiovascular:     Rate and Rhythm: Normal rate and regular rhythm.     Heart sounds: Normal heart sounds. No murmur heard. Pulmonary:     Breath sounds: Normal breath sounds. No wheezing or rales.  Abdominal:     Palpations: Abdomen is soft.     Tenderness: There is no abdominal tenderness.  Musculoskeletal:  Cervical back: Neck supple. No tenderness.     Right lower leg: Edema (1+) present.     Left lower leg: Edema (1+)  present.  Skin:    General: Skin is warm.     Findings: Erythema (With mild swelling over left leg-about 3 cm in diameter) present. No rash.  Neurological:     General: No focal deficit present.     Mental Status: She is alert and oriented to person, place, and time.     Sensory: Sensory deficit (B/l LE) present.     Motor: Weakness (3/5 - b/l LE) present.  Psychiatric:        Mood and Affect: Mood normal.        Behavior: Behavior normal.     BP 100/68   Pulse 96   Ht 5' 11 (1.803 m)   Wt 218 lb (98.9 kg)   SpO2 97%   BMI 30.40 kg/m  Wt Readings from Last 3 Encounters:  01/25/24 218 lb (98.9 kg)  08/10/23 234 lb 3.2 oz (106.2 kg)  01/12/23 246 lb 9.6 oz (111.9 kg)    Lab Results  Component Value Date   TSH 1.310 01/25/2024   Lab Results  Component Value Date   WBC 8.8 01/25/2024   HGB 10.6 (L) 01/25/2024   HCT 32.7 (L) 01/25/2024   MCV 93 01/25/2024   PLT 272 01/25/2024   Lab Results  Component Value Date   NA 138 01/25/2024   K 4.7 01/25/2024   CO2 20 01/25/2024   GLUCOSE 122 (H) 01/25/2024   BUN 36 (H) 01/25/2024   CREATININE 2.11 (H) 01/25/2024   BILITOT <0.2 01/25/2024   ALKPHOS 108 01/25/2024   AST 14 01/25/2024   ALT 10 01/25/2024   PROT 6.5 01/25/2024   ALBUMIN  4.0 01/25/2024   CALCIUM  9.5 01/25/2024   ANIONGAP 6 01/18/2022   EGFR 26 (L) 01/25/2024   Lab Results  Component Value Date   CHOL 185 08/10/2023   Lab Results  Component Value Date   HDL 40 08/10/2023   Lab Results  Component Value Date   LDLCALC 104 (H) 08/10/2023   Lab Results  Component Value Date   TRIG 239 (H) 08/10/2023   Lab Results  Component Value Date   CHOLHDL 4.6 (H) 08/10/2023   Lab Results  Component Value Date   HGBA1C 6.1 (H) 01/25/2024      Assessment & Plan:   Problem List Items Addressed This Visit       Cardiovascular and Mediastinum   Essential hypertension (Chronic)   BP Readings from Last 1 Encounters:  01/25/24 100/68    Well-controlled with Losartan -HCTZ and Metoprolol  Also takes Lasix  for LE swelling, advised to take Lasix  40 mg daily for now Counseled for compliance with the medications Advised DASH diet and moderate exercise/walking, at least 150 mins/week  Check EKG due to use of Abilify  EKG: Sinus rhythm. QTC 389 msec. No signs of active ischemia.      Relevant Orders   CMP14+EGFR (Completed)   CBC with Differential/Platelet (Completed)   TSH + free T4 (Completed)     Endocrine   Type 2 diabetes mellitus with diabetic neuropathy, with long-term current use of insulin  (HCC)   Lab Results  Component Value Date   HGBA1C 6.8 (H) 08/10/2023    Well-controlled currently, better than prior She has stopped Lantus  12 U QD, will check HbA1c, will decide about Lantus  based on it Previously we had stopped Metformin  1000 mg BID due to  GFR < 30 On glipizide  5 mg daily On Mounjaro , increase dose as tolerated - 5 mg qw now  Check CMP and HbA1c  Advised to follow diabetic diet On statin and ARB F/u CMP and lipid panel Diabetic eye exam: Advised to follow up with Ophthalmology for diabetic eye exam      Relevant Orders   CMP14+EGFR (Completed)   Hemoglobin A1c (Completed)   Urine Microalbumin w/creat. ratio (Completed)     Genitourinary   CKD (chronic kidney disease) stage 4, GFR 15-29 ml/min (HCC)   Last BMP reviewed, GFR 34, was better than prior Has had AKI in the past, likely due to dehydration Improved with gentle hydration Had held metformin  and additional 20 mg dose of Lasix  Check CMP        Other   Vitamin D  deficiency (Chronic)   Relevant Medications   Vitamin D , Ergocalciferol , (DRISDOL ) 1.25 MG (50000 UNIT) CAPS capsule   Left leg cellulitis   Left leg erythema and swelling likely due to cellulitis Started empiric Keflex  Keep area clean and dry      Relevant Medications   cephALEXin  (KEFLEX ) 500 MG capsule   Schizoaffective disorder (HCC)   On Abilify  and  Zoloft  Better controlled Was followed by psychiatry - Dr. Barbra, but has not been able to see them since her Psychiatrist left practice Referred to [facility] therapy  Patient and/or legal guardian verbally consented to [facility] services about presenting concerns and psychiatric consultation as appropriate.  The services will be billed as appropriate for the patient      Relevant Orders   Ambulatory referral to Psychology   AMB Referral VBCI Care Management   CMP14+EGFR (Completed)   CBC with Differential/Platelet (Completed)   TSH + free T4 (Completed)   Encounter for general adult medical examination with abnormal findings - Primary   Physical exam as documented. Counseling done  re healthy lifestyle involving commitment to 150 minutes exercise per week, heart healthy diet, and attaining healthy weight.The importance of adequate sleep also discussed. Immunization and cancer screening needs are specifically addressed at this visit.      Mixed stress and urge urinary incontinence   He has chronic urinary urgency and frequency, has urinary leakage at times as well Would benefit from urinary incontinence supplies      Other Visit Diagnoses       Encounter for immunization       Relevant Orders   Flu vaccine trivalent PF, 6mos and older(Flulaval,Afluria,Fluarix,Fluzone) (Completed)       Meds ordered this encounter  Medications   Vitamin D , Ergocalciferol , (DRISDOL ) 1.25 MG (50000 UNIT) CAPS capsule    Sig: Take 1 capsule (50,000 Units total) by mouth every 7 (seven) days.    Dispense:  12 capsule    Refill:  1    Please place vitamins in bubble pack.   cephALEXin  (KEFLEX ) 500 MG capsule    Sig: Take 1 capsule (500 mg total) by mouth 3 (three) times daily.    Dispense:  21 capsule    Refill:  0    Follow-up: Return in about 4 months (around 05/26/2024).    Suzzane MARLA Blanch, MD

## 2024-01-25 NOTE — Assessment & Plan Note (Signed)
 BP Readings from Last 1 Encounters:  01/25/24 100/68   Well-controlled with Losartan -HCTZ and Metoprolol  Also takes Lasix  for LE swelling, advised to take Lasix  40 mg daily for now Counseled for compliance with the medications Advised DASH diet and moderate exercise/walking, at least 150 mins/week  Check EKG due to use of Abilify  EKG: Sinus rhythm. QTC 389 msec. No signs of active ischemia.

## 2024-01-25 NOTE — Assessment & Plan Note (Signed)
 Left leg erythema and swelling likely due to cellulitis Started empiric Keflex  Keep area clean and dry

## 2024-01-25 NOTE — Progress Notes (Signed)
 Complex Care Management Note Care Guide Note  01/25/2024 Name: [name] MRN: [id number] DOB: 1966-04-22   Complex Care Management Outreach Attempts: An unsuccessful telephone outreach was attempted today to offer the patient information about available complex care management services.  Follow Up Plan:  Additional outreach attempts will be made to offer the patient complex care management information and services.   Encounter Outcome:  No Answer  Jeoffrey Buffalo , RMA     [place]  [facility], [facility] Guide  Direct Dial : [phone]  Website: [email or web address]

## 2024-01-25 NOTE — Patient Instructions (Signed)
 Schedule your Medicare Annual Wellness Visit at checkout.  Please schedule PAP smear with NP.  Please continue to take medications as prescribed.  Please continue to follow low carb diet and ambulate as tolerated.

## 2024-01-25 NOTE — Assessment & Plan Note (Addendum)
 On Abilify  and Zoloft  Better controlled Was followed by psychiatry - Dr. Barbra, but has not been able to see them since her Psychiatrist left practice Referred to [facility] therapy  Patient and/or legal guardian verbally consented to [facility] services about presenting concerns and psychiatric consultation as appropriate.  The services will be billed as appropriate for the patient

## 2024-01-25 NOTE — Assessment & Plan Note (Addendum)
 Lab Results  Component Value Date   HGBA1C 6.8 (H) 08/10/2023    Well-controlled currently, better than prior She has stopped Lantus  12 U QD, will check HbA1c, will decide about Lantus  based on it Previously we had stopped Metformin  1000 mg BID due to GFR < 30 On glipizide  5 mg daily On Mounjaro , increase dose as tolerated - 5 mg qw now  Check CMP and HbA1c  Advised to follow diabetic diet On statin and ARB F/u CMP and lipid panel Diabetic eye exam: Advised to follow up with Ophthalmology for diabetic eye exam

## 2024-01-25 NOTE — Assessment & Plan Note (Signed)
 Physical exam as documented. Counseling done  re healthy lifestyle involving commitment to 150 minutes exercise per week, heart healthy diet, and attaining healthy weight.The importance of adequate sleep also discussed. Immunization and cancer screening needs are specifically addressed at this visit.

## 2024-01-25 NOTE — Assessment & Plan Note (Addendum)
 Last BMP reviewed, GFR 34, was better than prior Has had AKI in the past, likely due to dehydration Improved with gentle hydration Had held metformin  and additional 20 mg dose of Lasix  Check CMP

## 2024-01-26 ENCOUNTER — Ambulatory Visit: Payer: Self-pay | Admitting: Internal Medicine

## 2024-01-26 ENCOUNTER — Other Ambulatory Visit: Payer: Self-pay | Admitting: Internal Medicine

## 2024-01-26 DIAGNOSIS — N184 Chronic kidney disease, stage 4 (severe): Secondary | ICD-10-CM

## 2024-01-26 LAB — CMP14+EGFR
ALT: 10 IU/L (ref 0–32)
AST: 14 IU/L (ref 0–40)
Albumin: 4 g/dL (ref 3.8–4.9)
Alkaline Phosphatase: 108 IU/L (ref 49–135)
BUN/Creatinine Ratio: 17 (ref 9–23)
BUN: 36 mg/dL — ABNORMAL HIGH (ref 6–24)
Bilirubin Total: <0.2 mg/dL (ref 0.0–1.2)
CO2: 20 mmol/L (ref 20–29)
Calcium: 9.5 mg/dL (ref 8.7–10.2)
Chloride: 103 mmol/L (ref 96–106)
Creatinine, Ser: 2.11 mg/dL — ABNORMAL HIGH (ref 0.57–1.00)
Globulin, Total: 2.5 g/dL (ref 1.5–4.5)
Glucose: 122 mg/dL — ABNORMAL HIGH (ref 70–99)
Potassium: 4.7 mmol/L (ref 3.5–5.2)
Sodium: 138 mmol/L (ref 134–144)
Total Protein: 6.5 g/dL (ref 6.0–8.5)
eGFR: 26 mL/min/1.73 — ABNORMAL LOW (ref >59)

## 2024-01-26 LAB — CBC WITH DIFFERENTIAL/PLATELET
Basophils Absolute: 0.1 x10E3/uL (ref 0.0–0.2)
Basos: 1 %
EOS (ABSOLUTE): 0.2 x10E3/uL (ref 0.0–0.4)
Eos: 2 %
Hematocrit: 32.7 % — ABNORMAL LOW (ref 34.0–46.6)
Hemoglobin: 10.6 g/dL — ABNORMAL LOW (ref 11.1–15.9)
Immature Grans (Abs): 0 x10E3/uL (ref 0.0–0.1)
Immature Granulocytes: 0 %
Lymphocytes Absolute: 3.3 x10E3/uL — ABNORMAL HIGH (ref 0.7–3.1)
Lymphs: 38 %
MCH: 30 pg (ref 26.6–33.0)
MCHC: 32.4 g/dL (ref 31.5–35.7)
MCV: 93 fL (ref 79–97)
Monocytes Absolute: 0.5 x10E3/uL (ref 0.1–0.9)
Monocytes: 6 %
Neutrophils Absolute: 4.7 x10E3/uL (ref 1.4–7.0)
Neutrophils: 53 %
Platelets: 272 x10E3/uL (ref 150–450)
RBC: 3.53 x10E6/uL — ABNORMAL LOW (ref 3.77–5.28)
RDW: 14.4 % (ref 11.7–15.4)
WBC: 8.8 x10E3/uL (ref 3.4–10.8)

## 2024-01-26 LAB — MICROALBUMIN / CREATININE URINE RATIO
Creatinine, Urine: 111.5 mg/dL
Microalb/Creat Ratio: 103 mg/g{creat} — ABNORMAL HIGH (ref 0–29)
Microalbumin, Urine: 114.5 ug/mL

## 2024-01-26 LAB — TSH+FREE T4
Free T4: 1.26 ng/dL (ref 0.82–1.77)
TSH: 1.31 u[IU]/mL (ref 0.450–4.500)

## 2024-01-26 LAB — HEMOGLOBIN A1C
Est. average glucose Bld gHb Est-mCnc: 128 mg/dL
Hgb A1c MFr Bld: 6.1 % — ABNORMAL HIGH (ref 4.8–5.6)

## 2024-01-30 NOTE — Progress Notes (Signed)
 Complex Care Management Note  Care Guide Note 01/30/2024 Name: MALAIYA PACZKOWSKI MRN: [id number] DOB: 1965/10/01  TAWNY RASPBERRY is a 58 y.o. year old female who sees Tobie Suzzane POUR, MD for primary care. I reached out to Rosaline FORBES Diesel by phone today to offer complex care management services.  Ms. Giovanni was given information about Complex Care Management services today including:   The Complex Care Management services include support from the care team which includes your Nurse Care Manager, Clinical Social Worker, or Pharmacist.  The Complex Care Management team is here to help remove barriers to the health concerns and goals most important to you. Complex Care Management services are voluntary, and the patient may decline or stop services at any time by request to their care team member.   Complex Care Management Consent Status: Patient did not agree to participate in complex care management services at this time.  Follow up plan:    Encounter Outcome:  Patient Refused  Jeoffrey Buffalo , RMA     [place]  [facility], [facility] Guide  Direct Dial : [phone]  Website: [email or web address]

## 2024-02-08 DIAGNOSIS — F209 Schizophrenia, unspecified: Secondary | ICD-10-CM | POA: Diagnosis not present

## 2024-02-14 ENCOUNTER — Encounter (INDEPENDENT_AMBULATORY_CARE_PROVIDER_SITE_OTHER): Payer: Self-pay | Admitting: Gastroenterology

## 2024-03-13 ENCOUNTER — Telehealth: Payer: Self-pay

## 2024-03-13 DIAGNOSIS — N3946 Mixed incontinence: Secondary | ICD-10-CM | POA: Insufficient documentation

## 2024-03-13 NOTE — Assessment & Plan Note (Signed)
 He has chronic urinary urgency and frequency, has urinary leakage at times as well Would benefit from urinary incontinence supplies

## 2024-03-13 NOTE — Telephone Encounter (Signed)
 Copied from CRM #[id number]. Topic: Clinical - Order For Equipment >> Mar 13, 2024  9:48 AM Treva T wrote: Reason for CRM: Patient calling requesting an order/referral be sent to [employer] company for incontinence supplies, pull ups, gloves, and bed pads for overnight protection.   Size for pull ups needed xxl Glove size xl  Patient states per company state she can get quantity of 200 per month.   [employer] ph# [phone]   Patient can be reached at [phone] if need to discuss further.

## 2024-03-14 ENCOUNTER — Telehealth: Payer: Self-pay

## 2024-03-14 NOTE — Telephone Encounter (Signed)
 [employer] office notes and needed information faxed this morning.

## 2024-03-14 NOTE — Telephone Encounter (Signed)
 Copied from CRM #[id number]. Topic: General - Other >> Mar 14, 2024 12:35 PM Sophia H wrote: Reason for CRM: patient is checking in on orders for incontinence supplies. Advised last update was the clinic would be on the look out for the fax they were sending over.  Patient states she spoke with [employer] this morning who advised they did send it over and had not received anything back yet. Patient requesting fax be checked again if possible.

## 2024-03-18 ENCOUNTER — Telehealth: Payer: Self-pay

## 2024-03-18 NOTE — Telephone Encounter (Signed)
 Copied from CRM #[id number]. Topic: Clinical - Order For Equipment >> Mar 18, 2024  9:28 AM Harlene ORN wrote: Reason for CRM: Patient is calling to have a prescription of incontinence pad from [employer] (200 a month) Phone for [employer]: [phone]

## 2024-03-18 NOTE — Telephone Encounter (Signed)
 Form received on 03/15/24, spoke with representative let him know we will complete form and fax back.

## 2024-04-05 ENCOUNTER — Telehealth: Payer: Self-pay

## 2024-04-05 NOTE — Telephone Encounter (Signed)
 Copied from CRM [id number]. Topic: Clinical - Order For Equipment >> Apr 05, 2024  1:12 PM Willma R wrote: Reason for CRM: Patient is requesting an order to get a new pair of Diabetic Shoes. Would also like a recommendation to where to get them.  Patient can be reached at [phone]

## 2024-04-06 ENCOUNTER — Other Ambulatory Visit: Payer: Self-pay | Admitting: Internal Medicine

## 2024-04-06 DIAGNOSIS — E1140 Type 2 diabetes mellitus with diabetic neuropathy, unspecified: Secondary | ICD-10-CM

## 2024-04-07 ENCOUNTER — Other Ambulatory Visit: Payer: Self-pay | Admitting: Internal Medicine

## 2024-04-07 DIAGNOSIS — E1149 Type 2 diabetes mellitus with other diabetic neurological complication: Secondary | ICD-10-CM

## 2024-04-09 ENCOUNTER — Other Ambulatory Visit: Payer: Self-pay | Admitting: Internal Medicine

## 2024-04-09 ENCOUNTER — Telehealth: Payer: Self-pay

## 2024-04-09 DIAGNOSIS — R600 Localized edema: Secondary | ICD-10-CM

## 2024-04-09 DIAGNOSIS — E1169 Type 2 diabetes mellitus with other specified complication: Secondary | ICD-10-CM

## 2024-04-09 DIAGNOSIS — E1142 Type 2 diabetes mellitus with diabetic polyneuropathy: Secondary | ICD-10-CM

## 2024-04-09 DIAGNOSIS — I10 Essential (primary) hypertension: Secondary | ICD-10-CM

## 2024-04-09 DIAGNOSIS — E1149 Type 2 diabetes mellitus with other diabetic neurological complication: Secondary | ICD-10-CM

## 2024-04-09 MED ORDER — MISC. DEVICES MISC: 0 refills | Status: AC

## 2024-04-09 NOTE — Addendum Note (Signed)
 Addended byBETHA TOBIE DOWNS on: 04/09/2024 04:57 PM   Modules accepted: Orders

## 2024-04-09 NOTE — Telephone Encounter (Signed)
 Copied from CRM #[id number]. Topic: General - Other >> Apr 09, 2024 12:44 PM Geneva B wrote: Reason for CRM:   Patient is requesting an order to get a new pair of Diabetic Shoes. Would also like a recommendation to where to get them. The info giving to her before she says it is not correct please call pt back [phone] (M) [phone] (H)

## 2024-04-10 NOTE — Telephone Encounter (Signed)
 Order created on parachute to diabetes shoe store, rx also faxed to [employer].

## 2024-05-01 ENCOUNTER — Telehealth: Payer: Self-pay

## 2024-05-01 ENCOUNTER — Other Ambulatory Visit (HOSPITAL_COMMUNITY): Payer: Self-pay

## 2024-05-01 NOTE — Telephone Encounter (Signed)
 Pharmacy Patient Advocate Encounter   Received notification from Onbase that prior authorization for Mounjaro  5 is required/requested.   Insurance verification completed.   The patient is insured through [facility].   Per test claim: Refill too soon. PA is not needed at this time. Medication was filled 04/18/24. Next eligible fill date is 05/09/24.

## 2024-05-22 ENCOUNTER — Encounter: Payer: Self-pay | Admitting: Oncology

## 2024-05-28 ENCOUNTER — Ambulatory Visit: Admitting: Internal Medicine

## 2024-06-06 ENCOUNTER — Encounter: Payer: Self-pay | Admitting: Oncology

## 2024-06-10 ENCOUNTER — Ambulatory Visit (HOSPITAL_COMMUNITY)

## 2024-06-19 ENCOUNTER — Ambulatory Visit: Admitting: Podiatry

## 2024-07-10 ENCOUNTER — Ambulatory Visit: Payer: Self-pay | Admitting: Internal Medicine
